# Patient Record
Sex: Female | Born: 1950 | Race: White | Hispanic: No | Smoking: Former smoker
Health system: Southern US, Community
[De-identification: ages and names within clinical notes are randomized; demographics above are authoritative.]

## PROBLEM LIST (undated history)

## (undated) DIAGNOSIS — C801 Malignant (primary) neoplasm, unspecified: Secondary | ICD-10-CM

## (undated) DIAGNOSIS — I10 Essential (primary) hypertension: Secondary | ICD-10-CM

## (undated) DIAGNOSIS — C20 Malignant neoplasm of rectum: Secondary | ICD-10-CM

## (undated) DIAGNOSIS — N1330 Unspecified hydronephrosis: Secondary | ICD-10-CM

## (undated) DIAGNOSIS — Z8041 Family history of malignant neoplasm of ovary: Secondary | ICD-10-CM

## (undated) DIAGNOSIS — Z8619 Personal history of other infectious and parasitic diseases: Secondary | ICD-10-CM

## (undated) DIAGNOSIS — G8929 Other chronic pain: Secondary | ICD-10-CM

## (undated) DIAGNOSIS — N3040 Irradiation cystitis without hematuria: Secondary | ICD-10-CM

## (undated) DIAGNOSIS — M545 Low back pain, unspecified: Secondary | ICD-10-CM

## (undated) DIAGNOSIS — R590 Localized enlarged lymph nodes: Secondary | ICD-10-CM

## (undated) DIAGNOSIS — C73 Malignant neoplasm of thyroid gland: Secondary | ICD-10-CM

## (undated) DIAGNOSIS — M1990 Unspecified osteoarthritis, unspecified site: Secondary | ICD-10-CM

## (undated) DIAGNOSIS — R519 Headache, unspecified: Secondary | ICD-10-CM

## (undated) DIAGNOSIS — Z807 Family history of other malignant neoplasms of lymphoid, hematopoietic and related tissues: Secondary | ICD-10-CM

## (undated) DIAGNOSIS — R51 Headache: Secondary | ICD-10-CM

## (undated) DIAGNOSIS — I890 Lymphedema, not elsewhere classified: Secondary | ICD-10-CM

## (undated) DIAGNOSIS — M5030 Other cervical disc degeneration, unspecified cervical region: Secondary | ICD-10-CM

## (undated) DIAGNOSIS — Z803 Family history of malignant neoplasm of breast: Secondary | ICD-10-CM

## (undated) DIAGNOSIS — R399 Unspecified symptoms and signs involving the genitourinary system: Secondary | ICD-10-CM

## (undated) DIAGNOSIS — D049 Carcinoma in situ of skin, unspecified: Secondary | ICD-10-CM

## (undated) DIAGNOSIS — Z801 Family history of malignant neoplasm of trachea, bronchus and lung: Secondary | ICD-10-CM

## (undated) DIAGNOSIS — E039 Hypothyroidism, unspecified: Secondary | ICD-10-CM

## (undated) DIAGNOSIS — Z973 Presence of spectacles and contact lenses: Secondary | ICD-10-CM

## (undated) HISTORY — DX: Personal history of other infectious and parasitic diseases: Z86.19

## (undated) HISTORY — PX: BUNIONECTOMY: SHX129

## (undated) HISTORY — DX: Family history of malignant neoplasm of breast: Z80.3

## (undated) HISTORY — DX: Headache: R51

## (undated) HISTORY — PX: SKIN CANCER EXCISION: SHX779

## (undated) HISTORY — DX: Family history of malignant neoplasm of trachea, bronchus and lung: Z80.1

## (undated) HISTORY — DX: Carcinoma in situ of skin, unspecified: D04.9

## (undated) HISTORY — DX: Family history of malignant neoplasm of ovary: Z80.41

## (undated) HISTORY — DX: Headache, unspecified: R51.9

## (undated) HISTORY — DX: Family history of other malignant neoplasms of lymphoid, hematopoietic and related tissues: Z80.7

---

## 1988-09-19 HISTORY — PX: SKIN CANCER EXCISION: SHX779

## 2000-06-07 ENCOUNTER — Encounter: Payer: Self-pay | Admitting: Family Medicine

## 2000-06-07 ENCOUNTER — Encounter: Admission: RE | Admit: 2000-06-07 | Discharge: 2000-06-07 | Payer: Self-pay | Admitting: Family Medicine

## 2001-06-11 ENCOUNTER — Encounter: Admission: RE | Admit: 2001-06-11 | Discharge: 2001-06-11 | Payer: Self-pay | Admitting: Family Medicine

## 2001-06-11 ENCOUNTER — Encounter: Payer: Self-pay | Admitting: Family Medicine

## 2002-08-26 ENCOUNTER — Encounter: Admission: RE | Admit: 2002-08-26 | Discharge: 2002-08-26 | Payer: Self-pay | Admitting: Family Medicine

## 2002-08-26 ENCOUNTER — Encounter: Payer: Self-pay | Admitting: Family Medicine

## 2003-10-03 ENCOUNTER — Encounter: Admission: RE | Admit: 2003-10-03 | Discharge: 2003-10-03 | Payer: Self-pay | Admitting: Family Medicine

## 2005-04-07 ENCOUNTER — Ambulatory Visit (HOSPITAL_COMMUNITY): Admission: RE | Admit: 2005-04-07 | Discharge: 2005-04-07 | Payer: Self-pay | Admitting: Family Medicine

## 2005-04-14 ENCOUNTER — Other Ambulatory Visit: Admission: RE | Admit: 2005-04-14 | Discharge: 2005-04-14 | Payer: Self-pay | Admitting: Family Medicine

## 2005-04-18 ENCOUNTER — Encounter: Admission: RE | Admit: 2005-04-18 | Discharge: 2005-04-18 | Payer: Self-pay | Admitting: Family Medicine

## 2006-05-29 ENCOUNTER — Ambulatory Visit (HOSPITAL_COMMUNITY): Admission: RE | Admit: 2006-05-29 | Discharge: 2006-05-29 | Payer: Self-pay | Admitting: Family Medicine

## 2007-07-06 ENCOUNTER — Encounter: Admission: RE | Admit: 2007-07-06 | Discharge: 2007-07-06 | Payer: Self-pay | Admitting: Family Medicine

## 2008-06-02 ENCOUNTER — Ambulatory Visit: Payer: Self-pay | Admitting: Internal Medicine

## 2008-06-02 DIAGNOSIS — M79609 Pain in unspecified limb: Secondary | ICD-10-CM | POA: Insufficient documentation

## 2008-06-02 DIAGNOSIS — M8580 Other specified disorders of bone density and structure, unspecified site: Secondary | ICD-10-CM | POA: Insufficient documentation

## 2008-06-02 DIAGNOSIS — M542 Cervicalgia: Secondary | ICD-10-CM | POA: Insufficient documentation

## 2008-06-02 DIAGNOSIS — M949 Disorder of cartilage, unspecified: Secondary | ICD-10-CM

## 2008-06-02 DIAGNOSIS — M899 Disorder of bone, unspecified: Secondary | ICD-10-CM

## 2008-06-02 LAB — CONVERTED CEMR LAB
Alkaline Phosphatase: 66 units/L (ref 39–117)
Bilirubin, Direct: 0.1 mg/dL (ref 0.0–0.3)
Calcium: 9.3 mg/dL (ref 8.4–10.5)
Eosinophils Absolute: 0.2 10*3/uL (ref 0.0–0.7)
Eosinophils Relative: 4.3 % (ref 0.0–5.0)
GFR calc Af Amer: 132 mL/min
Glucose, Bld: 81 mg/dL (ref 70–99)
HDL: 61.7 mg/dL (ref >39.0)
Hemoglobin: 13.6 g/dL (ref 12.0–15.0)
Lymphocytes Relative: 40.9 % (ref 12.0–46.0)
MCHC: 34.5 g/dL (ref 30.0–36.0)
Monocytes Relative: 6.4 % (ref 3.0–12.0)
Neutro Abs: 2.5 10*3/uL (ref 1.4–7.7)
Neutrophils Relative %: 47.2 % (ref 43.0–77.0)
Platelets: 181 10*3/uL (ref 150–400)
RDW: 12.6 % (ref 11.5–14.6)
Total Bilirubin: 0.8 mg/dL (ref 0.3–1.2)
Total Protein: 7.2 g/dL (ref 6.0–8.3)
Triglycerides: 46 mg/dL (ref 0–149)
VLDL: 9 mg/dL (ref 0–40)

## 2008-06-03 ENCOUNTER — Encounter: Payer: Self-pay | Admitting: Internal Medicine

## 2008-06-03 LAB — CONVERTED CEMR LAB: Vit D, 1,25-Dihydroxy: 29 — ABNORMAL LOW (ref 30–89)

## 2008-06-25 ENCOUNTER — Encounter: Payer: Self-pay | Admitting: Internal Medicine

## 2008-06-25 ENCOUNTER — Other Ambulatory Visit: Admission: RE | Admit: 2008-06-25 | Discharge: 2008-06-25 | Payer: Self-pay | Admitting: Internal Medicine

## 2008-06-25 ENCOUNTER — Ambulatory Visit: Payer: Self-pay | Admitting: Internal Medicine

## 2008-06-25 DIAGNOSIS — D049 Carcinoma in situ of skin, unspecified: Secondary | ICD-10-CM | POA: Insufficient documentation

## 2008-06-25 DIAGNOSIS — E559 Vitamin D deficiency, unspecified: Secondary | ICD-10-CM | POA: Insufficient documentation

## 2008-07-09 ENCOUNTER — Encounter: Admission: RE | Admit: 2008-07-09 | Discharge: 2008-07-09 | Payer: Self-pay | Admitting: Internal Medicine

## 2008-11-11 ENCOUNTER — Ambulatory Visit: Payer: Self-pay | Admitting: Internal Medicine

## 2009-04-03 ENCOUNTER — Ambulatory Visit: Payer: Self-pay | Admitting: Internal Medicine

## 2009-07-10 ENCOUNTER — Encounter: Admission: RE | Admit: 2009-07-10 | Discharge: 2009-07-10 | Payer: Self-pay | Admitting: Internal Medicine

## 2010-07-16 ENCOUNTER — Encounter: Admission: RE | Admit: 2010-07-16 | Discharge: 2010-07-16 | Payer: Self-pay | Admitting: Internal Medicine

## 2010-08-10 ENCOUNTER — Ambulatory Visit: Payer: Self-pay | Admitting: Internal Medicine

## 2010-08-10 LAB — CONVERTED CEMR LAB
AST: 32 units/L (ref 0–37)
Albumin: 3.9 g/dL (ref 3.5–5.2)
BUN: 16 mg/dL (ref 6–23)
Basophils Absolute: 0 10*3/uL (ref 0.0–0.1)
Bilirubin, Direct: 0.1 mg/dL (ref 0.0–0.3)
Calcium: 9.2 mg/dL (ref 8.4–10.5)
Chloride: 104 meq/L (ref 96–112)
Creatinine, Ser: 0.8 mg/dL (ref 0.4–1.2)
Eosinophils Relative: 4 % (ref 0.0–5.0)
HCT: 39 % (ref 36.0–46.0)
HDL: 78.3 mg/dL (ref >39.00)
Hemoglobin: 13.3 g/dL (ref 12.0–15.0)
LDL Cholesterol: 103 mg/dL — ABNORMAL HIGH (ref 0–99)
MCHC: 34.2 g/dL (ref 30.0–36.0)
Monocytes Absolute: 0.4 10*3/uL (ref 0.1–1.0)
Monocytes Relative: 6.3 % (ref 3.0–12.0)
Neutro Abs: 2.8 10*3/uL (ref 1.4–7.7)
Neutrophils Relative %: 48.3 % (ref 43.0–77.0)
Platelets: 205 10*3/uL (ref 150.0–400.0)
RDW: 13.4 % (ref 11.5–14.6)
Sodium: 141 meq/L (ref 135–145)
Total Protein: 6.7 g/dL (ref 6.0–8.3)

## 2010-08-17 ENCOUNTER — Encounter (INDEPENDENT_AMBULATORY_CARE_PROVIDER_SITE_OTHER): Payer: Self-pay | Admitting: *Deleted

## 2010-09-02 ENCOUNTER — Encounter (INDEPENDENT_AMBULATORY_CARE_PROVIDER_SITE_OTHER): Payer: Self-pay

## 2010-09-06 ENCOUNTER — Ambulatory Visit: Payer: Self-pay | Admitting: Gastroenterology

## 2010-09-24 ENCOUNTER — Ambulatory Visit: Admit: 2010-09-24 | Payer: Self-pay | Admitting: Gastroenterology
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
## 2010-10-19 NOTE — Letter (Signed)
Summary: Pre Visit Letter Revised  [facility]  [address] [place], [place] [address]   Phone: [phone]  Fax: [phone]        08/17/2010 MRN: [id number] [name] [address] [place], [place]  [address]             Procedure Date:  09/24/2010  Welcome to the Gastroenterology Division at [facility].    You are scheduled to see a nurse for your pre-procedure visit on 09/06/2010 at 2:00PM on the 3rd floor at [facility], [address]  We ask that you try to arrive at our office 15 minutes prior to your appointment time to allow for check-in.  Please take a minute to review the attached form.  If you answer "Yes" to one or more of the questions on the first page, we ask that you call the person listed at your earliest opportunity.  If you answer "No" to all of the questions, please complete the rest of the form and bring it to your appointment.    Your nurse visit will consist of discussing your medical and surgical history, your immediate family medical history, and your medications.   If you are unable to list all of your medications on the form, please bring the medication bottles to your appointment and we will list them.  We will need to be aware of both prescribed and over the counter drugs.  We will need to know exact dosage information as well.    Please be prepared to read and sign documents such as consent forms, a financial agreement, and acknowledgement forms.  If necessary, and with your consent, a friend or relative is welcome to sit-in on the nurse visit with you.  Please bring your insurance card so that we may make a copy of it.  If your insurance requires a referral to see a specialist, please bring your referral form from your primary care physician.  No co-pay is required for this nurse visit.     If you cannot keep your appointment, please call [phone] to cancel or reschedule prior to your appointment date.   This allows [place] the opportunity to schedule an appointment for another patient in need of care.    Thank you for choosing [facility] for your medical needs.  We appreciate the opportunity to care for you.  Please visit [place] at our website  to learn more about our practice.  Sincerely, The Gastroenterology Division

## 2010-10-21 NOTE — Miscellaneous (Signed)
Summary: Lec previsit  Clinical Lists Changes  Medications: Added new medication of MOVIPREP 100 GM  SOLR (PEG-KCL-NACL-NASULF-NA ASC-C) As per prep instructions. - Signed Rx of MOVIPREP 100 GM  SOLR (PEG-KCL-NACL-NASULF-NA ASC-C) As per prep instructions.;  #1 x 0;  Signed;  Entered by: Ulis Rias RN;  Authorized by: Louis Meckel MD;  Method used: Electronically to [id number] [address] [place], [place], [place]  [address], Ph: [phone], Fax: [phone] Observations: Added new observation of ALLERGY REV: Done (09/07/2010 16:13)    Prescriptions: MOVIPREP 100 GM  SOLR (PEG-KCL-NACL-NASULF-NA ASC-C) As per prep instructions.  #1 x 0   Entered by:   Ulis Rias RN   Authorized by:   Louis Meckel MD   Signed by:   Ulis Rias RN on 09/07/2010   Method used:   [facility] to        [facility] [id number]       [address]       [place], [place]  [address]       Ph: [phone]       Fax: [phone]   RxID:   [id number]

## 2010-10-21 NOTE — Letter (Signed)
Summary: [facility] Instructions  [facility]  [address] [place], [place] [address]   Phone: [phone]  Fax: [phone]       Ashley Moore    02/25/1951    MRN: [id number]        Procedure Day /Date:  09/24/10   Friday     Arrival Time:  10:30am      Procedure Time:  11:30am     Location of Procedure:                    _x _  [facility] (4th Floor)                        PREPARATION FOR COLONOSCOPY WITH MOVIPREP   Starting 5 days prior to your procedure _1/1/12 _ do not eat nuts, seeds, popcorn, corn, beans, peas,  salads, or any raw vegetables.  Do not take any fiber supplements (e.g. Metamucil, Citrucel, and Benefiber).  THE DAY BEFORE YOUR PROCEDURE         DATE:  09/23/10   DAY:  Thursday  1.  Drink clear liquids the entire day-NO SOLID FOOD  2.  Do not drink anything colored red or purple.  Avoid juices with pulp.  No orange juice.  3.  Drink at least 64 oz. (8 glasses) of fluid/clear liquids during the day to prevent dehydration and help the prep work efficiently.  CLEAR LIQUIDS INCLUDE: Water Jello Ice Popsicles Tea (sugar ok, no milk/cream) Powdered fruit flavored drinks Coffee (sugar ok, no milk/cream) Gatorade Juice: apple, white grape, white cranberry  Lemonade Clear bullion, consomm, broth Carbonated beverages (any kind) Strained chicken noodle soup Hard Candy                             4.  In the morning, mix first dose of MoviPrep solution:    Empty 1 Pouch A and 1 Pouch B into the disposable container    Add lukewarm drinking water to the top line of the container. Mix to dissolve    Refrigerate (mixed solution should be used within 24 hrs)  5.  Begin drinking the prep at 5:00 p.m. The MoviPrep container is divided by 4 marks.   Every 15 minutes drink the solution down to the next mark (approximately 8 oz) until the full liter is complete.   6.  Follow completed prep with 16 oz of clear liquid of your  choice (Nothing red or purple).  Continue to drink clear liquids until bedtime.  7.  Before going to bed, mix second dose of MoviPrep solution:    Empty 1 Pouch A and 1 Pouch B into the disposable container    Add lukewarm drinking water to the top line of the container. Mix to dissolve    Refrigerate  THE DAY OF YOUR PROCEDURE      DATE:  09/24/10  DAY:  Friday  Beginning at  6:30 a.m. (5 hours before procedure):         1. Every 15 minutes, drink the solution down to the next mark (approx 8 oz) until the full liter is complete.  2. Follow completed prep with 16 oz. of clear liquid of your choice.    3. You may drink clear liquids until  9:30am  (2 HOURS BEFORE PROCEDURE).   MEDICATION INSTRUCTIONS  Unless otherwise instructed, you should take regular prescription medications with a  small sip of water   as early as possible the morning of your procedure.        OTHER INSTRUCTIONS  You will need a responsible adult at least 62 years of age to accompany you and drive you home.   This person must remain in the waiting room during your procedure.  Wear loose fitting clothing that is easily removed.  Leave jewelry and other valuables at home.  However, you may wish to bring a book to read or  an iPod/MP3 player to listen to music as you wait for your procedure to start.  Remove all body piercing jewelry and leave at home.  Total time from sign-in until discharge is approximately 2-3 hours.  You should go home directly after your procedure and rest.  You can resume normal activities the  day after your procedure.  The day of your procedure you should not:   Drive   Make legal decisions   Operate machinery   Drink alcohol   Return to work  You will receive specific instructions about eating, activities and medications before you leave.    The above instructions have been reviewed and explained to me by   Ulis Rias RN  September 07, 2010 4:34 PM     I  fully understand and can verbalize these instructions _____________________________ Date _________

## 2010-11-05 ENCOUNTER — Encounter: Payer: Self-pay | Admitting: Gastroenterology

## 2010-11-05 ENCOUNTER — Other Ambulatory Visit: Payer: Self-pay | Admitting: Gastroenterology

## 2010-11-05 ENCOUNTER — Other Ambulatory Visit (AMBULATORY_SURGERY_CENTER): Payer: PRIVATE HEALTH INSURANCE | Admitting: Gastroenterology

## 2010-11-05 DIAGNOSIS — D126 Benign neoplasm of colon, unspecified: Secondary | ICD-10-CM

## 2010-11-05 DIAGNOSIS — K5730 Diverticulosis of large intestine without perforation or abscess without bleeding: Secondary | ICD-10-CM

## 2010-11-05 DIAGNOSIS — Z1211 Encounter for screening for malignant neoplasm of colon: Secondary | ICD-10-CM

## 2010-11-10 NOTE — Procedures (Addendum)
Summary: Colonoscopy  Patient: Ashley Moore Note: All result statuses are Final unless otherwise noted.  Tests: (1) Colonoscopy (COL)   COL Colonoscopy           DONE     [facility]     [address]     [place], [place]  [address]           COLONOSCOPY PROCEDURE REPORT           PATIENT:  Ashley Moore, Ashley Moore  MR#:  [id number]     BIRTHDATE:  1951/05/24, 59 yrs. old  GENDER:  female           ENDOSCOPIST:  Barbette Hair. Arlyce Dice, MD     Referred by:  Neta Mends. Panosh, M.D.           PROCEDURE DATE:  11/05/2010     PROCEDURE:  Colonoscopy with snare polypectomy     ASA CLASS:  Class I     INDICATIONS:  1) Routine Risk Screening           MEDICATIONS:   Fentanyl 50 mcg IV, Versed 6 mg IV           DESCRIPTION OF PROCEDURE:   After the risks benefits and     alternatives of the procedure were thoroughly explained, informed     consent was obtained.  Digital rectal exam was performed and     revealed no abnormalities.   The LB [id number] endoscope     was introduced through the anus and advanced to the cecum, which     was identified by both the appendix and ileocecal valve, without     limitations.  The quality of the prep was excellent, using     MoviPrep.  The instrument was then slowly withdrawn as the colon     was fully examined.     <<PROCEDUREIMAGES>>           FINDINGS:  A sessile polyp was found in the sigmoid colon. It was     3 mm in size. It was found 15 cm from the point of entry. Polyp     was snared without cautery. Retrieval was successful (see     image14). snare polyp  Mild diverticulosis was found in the     sigmoid colon (see image13 and image12).  Melanosis coli was found     (see image4 and image5). Severe melanosis throughout the colon     This was otherwise a normal examination of the colon (see image1,     image2, image8, image15, and image16).   Retroflexed views in the     rectum revealed no abnormalities.    The time to cecum =   2.0     minutes. The scope was then withdrawn (time =  7.75  min) from the     patient and the procedure completed.           COMPLICATIONS:  None           ENDOSCOPIC IMPRESSION:     1) 3 mm sessile polyp in the sigmoid colon     2) Mild diverticulosis in the sigmoid colon     3) Melanosis     4) Otherwise normal examination     RECOMMENDATIONS:     1) If the polyp(s) removed today are proven to be adenomatous     (pre-cancerous) polyps, you will need a repeat colonoscopy in 5     years. Otherwise you  should continue to follow colorectal cancer     screening guidelines for "routine risk" patients with colonoscopy     in 10 years.           REPEAT EXAM:   You will receive a letter from Dr. Arlyce Dice in 1-2     weeks, after reviewing the final pathology, with followup     recommendations.           ______________________________     Barbette Hair Arlyce Dice, MD           CC:           n.     eSIGNED:   Barbette Hair. Roxene Alviar at 11/05/2010 02:45 PM           Delrae Alfred, [phone]  Note: An exclamation mark (!) indicates a result that was not dispersed into the flowsheet. Document Creation Date: 11/05/2010 2:46 PM _______________________________________________________________________  (1) Order result status: Final Collection or observation date-time: 11/05/2010 14:37 Requested date-time:  Receipt date-time:  Reported date-time:  Referring Physician:   Ordering Physician: Melvia Heaps [phone]) Specimen Source:  Source: Launa Grill Order Number: [id number] Lab site:   Appended Document: Colonoscopy     Procedures Next Due Date:    Colonoscopy: 11/2020

## 2010-11-11 ENCOUNTER — Encounter: Payer: Self-pay | Admitting: Gastroenterology

## 2010-11-16 NOTE — Letter (Addendum)
Summary: Patient Notice-Hyperplastic Polyps  [facility]  [address] [place], [place] [address]   Phone: [phone]  Fax: [phone]        November 11, 2010 MRN: [id number]    Ashley Moore [address] [place], [place]  [address]    Dear [name],  I am pleased to inform you that the colon polyp(s) removed during your recent colonoscopy was (were) found to be hyperplastic.  These types of polyps are NOT pre-cancerous.  It is therefore my recommendation that you have a repeat colonoscopy examination in 10_ years for routine colorectal cancer screening.  Should you develop new or worsening symptoms of abdominal pain, bowel habit changes or bleeding from the rectum or bowels, please schedule an evaluation with either your primary care physician or with me.  Additional information/recommendations:  __No further action with gastroenterology is needed at this time.      Please follow-up with your primary care physician for your other      healthcare needs. __Please call [phone] to schedule a return visit to review      your situation.  __Please keep your follow-up visit as already scheduled.  _x_Continue treatment plan as outlined the day of your exam.  Please call us if you are having persistent problems or have questions about your condition that have not been fully answered at this time.  Sincerely,  Louis Meckel MD This letter has been electronically signed by your physician.  Appended Document: Patient Notice-Hyperplastic Polyps letter mailed

## 2011-08-02 ENCOUNTER — Other Ambulatory Visit: Payer: Self-pay | Admitting: Internal Medicine

## 2011-08-02 DIAGNOSIS — Z1231 Encounter for screening mammogram for malignant neoplasm of breast: Secondary | ICD-10-CM

## 2011-08-30 ENCOUNTER — Ambulatory Visit
Admission: RE | Admit: 2011-08-30 | Discharge: 2011-08-30 | Disposition: A | Discharge status: Home / Self Care | Payer: PRIVATE HEALTH INSURANCE | Source: Ambulatory Visit | Attending: Internal Medicine | Admitting: Internal Medicine

## 2011-08-30 DIAGNOSIS — Z1231 Encounter for screening mammogram for malignant neoplasm of breast: Secondary | ICD-10-CM

## 2012-07-16 LAB — HM MAMMOGRAPHY: HM Mammogram: NORMAL

## 2012-07-26 ENCOUNTER — Other Ambulatory Visit: Payer: Self-pay | Admitting: Internal Medicine

## 2012-07-26 DIAGNOSIS — Z803 Family history of malignant neoplasm of breast: Secondary | ICD-10-CM

## 2012-07-26 DIAGNOSIS — Z1231 Encounter for screening mammogram for malignant neoplasm of breast: Secondary | ICD-10-CM

## 2012-08-30 ENCOUNTER — Ambulatory Visit
Admission: RE | Admit: 2012-08-30 | Discharge: 2012-08-30 | Disposition: A | Discharge status: Home / Self Care | Payer: PRIVATE HEALTH INSURANCE | Source: Ambulatory Visit | Attending: Internal Medicine | Admitting: Internal Medicine

## 2012-08-30 DIAGNOSIS — Z1231 Encounter for screening mammogram for malignant neoplasm of breast: Secondary | ICD-10-CM

## 2012-08-30 DIAGNOSIS — Z803 Family history of malignant neoplasm of breast: Secondary | ICD-10-CM

## 2013-01-21 ENCOUNTER — Emergency Department (HOSPITAL_COMMUNITY): Payer: PRIVATE HEALTH INSURANCE

## 2013-01-21 ENCOUNTER — Emergency Department (HOSPITAL_COMMUNITY)
Admission: EM | Admit: 2013-01-21 | Discharge: 2013-01-21 | Disposition: A | Discharge status: Home / Self Care | Payer: PRIVATE HEALTH INSURANCE | Attending: Emergency Medicine | Admitting: Emergency Medicine

## 2013-01-21 ENCOUNTER — Encounter (HOSPITAL_COMMUNITY): Payer: Self-pay | Admitting: *Deleted

## 2013-01-21 DIAGNOSIS — R0602 Shortness of breath: Secondary | ICD-10-CM | POA: Insufficient documentation

## 2013-01-21 DIAGNOSIS — R0789 Other chest pain: Secondary | ICD-10-CM | POA: Insufficient documentation

## 2013-01-21 LAB — BASIC METABOLIC PANEL
Calcium: 9.9 mg/dL (ref 8.4–10.5)
Chloride: 103 mEq/L (ref 96–112)
Glucose, Bld: 93 mg/dL (ref 70–99)
Sodium: 140 mEq/L (ref 135–145)

## 2013-01-21 LAB — POCT I-STAT TROPONIN I

## 2013-01-21 LAB — CBC
HCT: 42.9 % (ref 36.0–46.0)
MCV: 81.9 fL (ref 78.0–100.0)
Platelets: 224 10*3/uL (ref 150–400)
RBC: 5.24 MIL/uL — ABNORMAL HIGH (ref 3.87–5.11)

## 2013-01-21 MED ORDER — ASPIRIN 325 MG PO TABS
325.0000 mg | ORAL_TABLET | ORAL | Status: AC
  Administered 2013-01-21: 325 mg via ORAL
  Filled 2013-01-21: qty 1

## 2013-01-21 NOTE — ED Notes (Signed)
Patient states chest pain this am starting in central chest and neck and radiating into right arm

## 2013-01-21 NOTE — ED Provider Notes (Signed)
History     CSN: [id number]  Arrival date & time 01/21/13  0920   First MD Initiated Contact with Patient 01/21/13 [phone]      Chief Complaint  Patient presents with  . Chest Pain  . Shortness of Breath    (Consider location/radiation/quality/duration/timing/severity/associated sxs/prior treatment) HPI  Patient reports this morning about 4:50 in the morning she thought she heard someone walking on her porch and knocking her around her house. She reports she has a neighbor who she feels has been snooping around her house. 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 She reports he was there today about 6:15. When he left at 8:15 she started getting a pain in her right neck it went down into her chest and was sharp for 1-2 minutes. She did feel like it was a "clutching" pain. She had some shortness of breath but denies diaphoresis, nausea, vomiting. She states she's never had this before. Patient reports she has been under a lot of stress. She reports this neighbor has been bothering her for the past 8 years since she has moved to this residence. [place] house [place] acres however this neighbor is her immediate next door neighbor. She reports he also frequents her place of business that she has that is about 1 mile from her house. She reports he has been getting verbally aggressive at times and getting in her face talking to her and suggesting that he would be interested in her if she had "more pep in her step". She also reports her neighbor on the other side is a Emergency planning/management officer. She states she has never talked to him about her problems with the current neighbor.  PCP Dr Fabian Sharp  History reviewed. No pertinent past medical history.  History reviewed. No pertinent past surgical history.  No family history on file. No family history of coronary artery disease  History  Substance Use Topics  . Smoking status: Never Smoker   .  Smokeless tobacco: Not on file  . 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    Allergies  Penicillins  Home Medications  No current outpatient prescriptions on file. Vitamins  BP 142/77  Pulse 67  Temp(Src) 97.6 F (36.4 C) (Oral)  Resp 19  Ht 5\' 3"  (1.6 m)  Wt 190 lb (86.183 kg)  BMI 33.67 kg/m2  SpO2 100%  Vital signs normal    Physical Exam  Nursing note and vitals reviewed. Constitutional: She is oriented to person, place, and time. She appears well-developed and well-nourished.  Non-toxic appearance. She does not appear ill. No distress.  HENT:  Head: Normocephalic and atraumatic.  Right Ear: External ear normal.  Left Ear: External ear normal.  Nose: Nose normal. No mucosal edema or rhinorrhea.  Mouth/Throat: Oropharynx is clear and moist and mucous membranes are normal. No dental abscesses or edematous.  Eyes: Conjunctivae and EOM are normal. Pupils are equal, round, and reactive to light.  Neck: Normal range of motion and full passive range of motion without pain. Neck supple.  Cardiovascular: Normal rate,  regular rhythm and normal heart sounds.  Exam reveals no gallop and no friction rub.   No murmur heard. Pulmonary/Chest: Effort normal and breath sounds normal. No respiratory distress. She has no wheezes. She has no rhonchi. She has no rales. She exhibits no tenderness and no crepitus.  Abdominal: Soft. Normal appearance and bowel sounds are normal. She exhibits no distension. There is no tenderness. There is no rebound and no guarding.  Musculoskeletal: Normal range of motion. She exhibits no edema and no tenderness.  Moves all extremities well.   Neurological: She is alert and oriented to person, place, and time. She  has normal strength. No cranial nerve deficit.  Skin: Skin is warm, dry and intact. No rash noted. No erythema. No pallor.  Psychiatric: Her speech is normal and behavior is normal. Her mood appears not anxious.  anxious    ED Course  Procedures (including critical care time)  Pt remains pain free during her ED visit.  She spoke to the off duty police officer in the ED who advised her how to handle her neighbor. We also discussed letting her other next door neighbor, who is a Emergency planning/management officer know about her problems and he may also be able to give her assistance, and to make sure she is safe at home and at work.   Results for orders placed during the hospital encounter of 01/21/13  CBC      Result Value Range   WBC 7.1  4.0 - 10.5 K/uL   RBC 5.24 (*) 3.87 - 5.11 MIL/uL   Hemoglobin 14.6  12.0 - 15.0 g/dL   HCT 16.1  09.6 - 04.5 %   MCV 81.9  78.0 - 100.0 fL   MCH 27.9  26.0 - 34.0 pg   MCHC 34.0  30.0 - 36.0 g/dL   RDW 40.9  81.1 - 91.4 %   Platelets 224  150 - 400 K/uL  BASIC METABOLIC PANEL      Result Value Range   Sodium 140  135 - 145 mEq/L   Potassium 3.9  3.5 - 5.1 mEq/L   Chloride 103  96 - 112 mEq/L   CO2 27  19 - 32 mEq/L   Glucose, Bld 93  70 - 99 mg/dL   BUN 15  6 - 23 mg/dL   Creatinine, Ser 7.82  0.50 - 1.10 mg/dL   Calcium 9.9  8.4 - 95.6 mg/dL   GFR calc non Af Amer >90  >90 mL/min   GFR calc Af Amer >90  >90 mL/min  POCT I-STAT TROPONIN I      Result Value Range   Troponin i, poc 0.00  0.00 - 0.08 ng/mL   Comment 3             Laboratory interpretation all normal  Dg Chest Portable 1 View  01/21/2013  *RADIOLOGY REPORT*  Clinical Data: Sharp pain in right jaw extending into right chest and down right arm, nausea, dizziness, slight headache, former smoker  PORTABLE CHEST - 1 VIEW  Comparison: Portable exam 1025 hours without priors for comparison  Findings: Normal heart size and pulmonary vascularity. Tortuous aorta. Minimal right basilar atelectasis. Lungs  otherwise clear. No pleural effusion or pneumothorax. Bones unremarkable.  IMPRESSION: Minimal right basilar atelectasis.   Original Report Authenticated By: Ulyses Southward, M.D.      Date: 01/21/2013  Rate: 68  Rhythm: normal sinus rhythm  QRS Axis: normal  Intervals: normal  ST/T Wave abnormalities: normal  Conduction Disutrbances:none  Narrative Interpretation:   Old EKG Reviewed: none available      1. Chest pain, atypical     Plan discharge  Devoria Albe, MD, FACEP       MDM patient has no risk factors for coronary artery disease. She does report a lot of stress related to her current neighbor. She spoke to the off duty police officer in the emergency department and we also discussed ways to proceed with his behavior and to keep herself safe.           Ward Givens, MD 01/21/13 1251

## 2013-02-06 ENCOUNTER — Other Ambulatory Visit (INDEPENDENT_AMBULATORY_CARE_PROVIDER_SITE_OTHER): Payer: PRIVATE HEALTH INSURANCE

## 2013-02-06 DIAGNOSIS — Z0000 Encounter for general adult medical examination without abnormal findings: Secondary | ICD-10-CM

## 2013-02-06 LAB — TSH: TSH: 2.74 u[IU]/mL (ref 0.35–5.50)

## 2013-02-06 LAB — CBC WITH DIFFERENTIAL/PLATELET
Basophils Absolute: 0 10*3/uL (ref 0.0–0.1)
Eosinophils Absolute: 0.3 10*3/uL (ref 0.0–0.7)
Eosinophils Relative: 4.5 % (ref 0.0–5.0)
HCT: 40.6 % (ref 36.0–46.0)
Lymphocytes Relative: 36 % (ref 12.0–46.0)
Lymphs Abs: 2.6 10*3/uL (ref 0.7–4.0)
MCHC: 33.1 g/dL (ref 30.0–36.0)
Monocytes Relative: 5.8 % (ref 3.0–12.0)
Neutrophils Relative %: 53.2 % (ref 43.0–77.0)
Platelets: 187 10*3/uL (ref 150.0–400.0)
WBC: 7.1 10*3/uL (ref 4.5–10.5)

## 2013-02-06 LAB — HEPATIC FUNCTION PANEL
AST: 30 U/L (ref 0–37)
Albumin: 3.8 g/dL (ref 3.5–5.2)
Bilirubin, Direct: 0.1 mg/dL (ref 0.0–0.3)
Total Bilirubin: 0.4 mg/dL (ref 0.3–1.2)

## 2013-02-06 LAB — BASIC METABOLIC PANEL
BUN: 14 mg/dL (ref 6–23)
Calcium: 9 mg/dL (ref 8.4–10.5)
Potassium: 3.9 mEq/L (ref 3.5–5.1)
Sodium: 139 mEq/L (ref 135–145)

## 2013-02-06 LAB — LIPID PANEL
HDL: 71.2 mg/dL (ref >39.00)
Total CHOL/HDL Ratio: 2

## 2013-02-13 ENCOUNTER — Ambulatory Visit (INDEPENDENT_AMBULATORY_CARE_PROVIDER_SITE_OTHER): Payer: PRIVATE HEALTH INSURANCE | Admitting: Internal Medicine

## 2013-02-13 ENCOUNTER — Other Ambulatory Visit (HOSPITAL_COMMUNITY)
Admission: RE | Admit: 2013-02-13 | Discharge: 2013-02-13 | Disposition: A | Discharge status: Home / Self Care | Payer: PRIVATE HEALTH INSURANCE | Source: Ambulatory Visit | Attending: Internal Medicine | Admitting: Internal Medicine

## 2013-02-13 ENCOUNTER — Encounter: Payer: Self-pay | Admitting: Internal Medicine

## 2013-02-13 VITALS — BP 120/60 | HR 78 | Temp 97.7°F | Ht 62.5 in | Wt 205.0 lb

## 2013-02-13 DIAGNOSIS — Z1151 Encounter for screening for human papillomavirus (HPV): Secondary | ICD-10-CM | POA: Insufficient documentation

## 2013-02-13 DIAGNOSIS — Z23 Encounter for immunization: Secondary | ICD-10-CM

## 2013-02-13 DIAGNOSIS — Z01419 Encounter for gynecological examination (general) (routine) without abnormal findings: Secondary | ICD-10-CM | POA: Insufficient documentation

## 2013-02-13 DIAGNOSIS — Z0000 Encounter for general adult medical examination without abnormal findings: Secondary | ICD-10-CM

## 2013-02-13 DIAGNOSIS — Z803 Family history of malignant neoplasm of breast: Secondary | ICD-10-CM

## 2013-02-13 DIAGNOSIS — Z2911 Encounter for prophylactic immunotherapy for respiratory syncytial virus (RSV): Secondary | ICD-10-CM

## 2013-02-13 DIAGNOSIS — R21 Rash and other nonspecific skin eruption: Secondary | ICD-10-CM | POA: Insufficient documentation

## 2013-02-13 MED ORDER — FLUOCINONIDE-E 0.05 % EX CREA: TOPICAL_CREAM | Freq: Two times a day (BID) | CUTANEOUS | Status: DC

## 2013-02-13 NOTE — Patient Instructions (Addendum)
Rash   Not shingles  . Could be contact dermatitis  Consider  Fungal or tinea   On skin  And treat for both. Use OTC lamasil or lotrimin twice a day for at least 2-3 weeks   If improving then continue until resolved  And add the   Steroid topical also .  If  persistent or progressive  Contact us for advice or recheck .  Will notify you  of PAPs   when available.  Add aerobic.    To  Mix of exercise .     Ask  Your sister about ?  If tested for breast cancer gene for your risk  .  Get regular mammograms .   Preventive Care for Adults, Female A healthy lifestyle and preventive care can promote health and wellness. Preventive health guidelines for women include the following key practices.  A routine yearly physical is a good way to check with your caregiver about your health and preventive screening. It is a chance to share any concerns and updates on your health, and to receive a thorough exam.  Visit your dentist for a routine exam and preventive care every 6 months. Brush your teeth twice a day and floss once a day. Good oral hygiene prevents tooth decay and gum disease.  The frequency of eye exams is based on your age, health, family medical history, use of contact lenses, and other factors. Follow your caregiver's recommendations for frequency of eye exams.  Eat a healthy diet. Foods like vegetables, fruits, whole grains, low-fat dairy products, and lean protein foods contain the nutrients you need without too many calories. Decrease your intake of foods high in solid fats, added sugars, and salt. Eat the right amount of calories for you.Get information about a proper diet from your caregiver, if necessary.  Regular physical exercise is one of the most important things you can do for your health. Most adults should get at least 150 minutes of moderate-intensity exercise (any activity that increases your heart rate and causes you to sweat) each week. In addition, most adults need  muscle-strengthening exercises on 2 or more days a week.  Maintain a healthy weight. The body mass index (BMI) is a screening tool to identify possible weight problems. It provides an estimate of body fat based on height and weight. Your caregiver can help determine your BMI, and can help you achieve or maintain a healthy weight.For adults 20 years and older:  A BMI below 18.5 is considered underweight.  A BMI of 18.5 to 24.9 is normal.  A BMI of 25 to 29.9 is considered overweight.  A BMI of 30 and above is considered obese.  Maintain normal blood lipids and cholesterol levels by exercising and minimizing your intake of saturated fat. Eat a balanced diet with plenty of fruit and vegetables. Blood tests for lipids and cholesterol should begin at age 38 and be repeated every 5 years. If your lipid or cholesterol levels are high, you are over 50, or you are at high risk for heart disease, you may need your cholesterol levels checked more frequently.Ongoing high lipid and cholesterol levels should be treated with medicines if diet and exercise are not effective.  If you smoke, find out from your caregiver how to quit. If you do not use tobacco, do not start.  If you are pregnant, do not drink alcohol. If you are breastfeeding, be very cautious about drinking alcohol. If you are not pregnant and choose to drink alcohol, do  not exceed 1 drink per day. One drink is considered to be 12 ounces (355 mL) of beer, 5 ounces (148 mL) of wine, or 1.5 ounces (44 mL) of liquor.  Avoid use of street drugs. Do not share needles with anyone. Ask for help if you need support or instructions about stopping the use of drugs.  High blood pressure causes heart disease and increases the risk of stroke. Your blood pressure should be checked at least every 1 to 2 years. Ongoing high blood pressure should be treated with medicines if weight loss and exercise are not effective.  If you are 47 to 64 years old, ask your  caregiver if you should take aspirin to prevent strokes.  Diabetes screening involves taking a blood sample to check your fasting blood sugar level. This should be done once every 3 years, after age 61, if you are within normal weight and without risk factors for diabetes. Testing should be considered at a younger age or be carried out more frequently if you are overweight and have at least 1 risk factor for diabetes.  Breast cancer screening is essential preventive care for women. You should practice "breast self-awareness." This means understanding the normal appearance and feel of your breasts and may include breast self-examination. Any changes detected, no matter how small, should be reported to a caregiver. Women in their 81s and 30s should have a clinical breast exam (CBE) by a caregiver as part of a regular health exam every 1 to 3 years. After age 28, women should have a CBE every year. Starting at age 47, women should consider having a mammography (breast X-ray test) every year. Women who have a family history of breast cancer should talk to their caregiver about genetic screening. Women at a high risk of breast cancer should talk to their caregivers about having magnetic resonance imaging (MRI) and a mammography every year.  The Pap test is a screening test for cervical cancer. A Pap test can show cell changes on the cervix that might become cervical cancer if left untreated. A Pap test is a procedure in which cells are obtained and examined from the lower end of the uterus (cervix).  Women should have a Pap test starting at age 75.  Between ages 64 and 48, Pap tests should be repeated every 2 years.  Beginning at age 9, you should have a Pap test every 3 years as long as the past 3 Pap tests have been normal.  Some women have medical problems that increase the chance of getting cervical cancer. Talk to your caregiver about these problems. It is especially important to talk to your  caregiver if a new problem develops soon after your last Pap test. In these cases, your caregiver may recommend more frequent screening and Pap tests.  The above recommendations are the same for women who have or have not gotten the vaccine for human papillomavirus (HPV).  If you had a hysterectomy for a problem that was not cancer or a condition that could lead to cancer, then you no longer need Pap tests. Even if you no longer need a Pap test, a regular exam is a good idea to make sure no other problems are starting.  If you are between ages 72 and 71, and you have had normal Pap tests going back 10 years, you no longer need Pap tests. Even if you no longer need a Pap test, a regular exam is a good idea to make sure no other  problems are starting.  If you have had past treatment for cervical cancer or a condition that could lead to cancer, you need Pap tests and screening for cancer for at least 20 years after your treatment.  If Pap tests have been discontinued, risk factors (such as a new sexual partner) need to be reassessed to determine if screening should be resumed.  The HPV test is an additional test that may be used for cervical cancer screening. The HPV test looks for the virus that can cause the cell changes on the cervix. The cells collected during the Pap test can be tested for HPV. The HPV test could be used to screen women aged 96 years and older, and should be used in women of any age who have unclear Pap test results. After the age of 25, women should have HPV testing at the same frequency as a Pap test.  Colorectal cancer can be detected and often prevented. Most routine colorectal cancer screening begins at the age of 29 and continues through age 50. However, your caregiver may recommend screening at an earlier age if you have risk factors for colon cancer. On a yearly basis, your caregiver may provide home test kits to check for hidden blood in the stool. Use of a small camera at  the end of a tube, to directly examine the colon (sigmoidoscopy or colonoscopy), can detect the earliest forms of colorectal cancer. Talk to your caregiver about this at age 18, when routine screening begins. Direct examination of the colon should be repeated every 5 to 10 years through age 50, unless early forms of pre-cancerous polyps or small growths are found.  Hepatitis C blood testing is recommended for all people born from [birth year] through 1965 and any individual with known risks for hepatitis C.  Practice safe sex. Use condoms and avoid high-risk sexual practices to reduce the spread of sexually transmitted infections (STIs). STIs include gonorrhea, chlamydia, syphilis, trichomonas, herpes, HPV, and human immunodeficiency virus (HIV). Herpes, HIV, and HPV are viral illnesses that have no cure. They can result in disability, cancer, and death. Sexually active women aged 5 and younger should be checked for chlamydia. Older women with new or multiple partners should also be tested for chlamydia. Testing for other STIs is recommended if you are sexually active and at increased risk.  Osteoporosis is a disease in which the bones lose minerals and strength with aging. This can result in serious bone fractures. The risk of osteoporosis can be identified using a bone density scan. Women ages 40 and over and women at risk for fractures or osteoporosis should discuss screening with their caregivers. Ask your caregiver whether you should take a calcium supplement or vitamin D to reduce the rate of osteoporosis.  Menopause can be associated with physical symptoms and risks. Hormone replacement therapy is available to decrease symptoms and risks. You should talk to your caregiver about whether hormone replacement therapy is right for you.  Use sunscreen with sun protection factor (SPF) of 30 or more. Apply sunscreen liberally and repeatedly throughout the day. You should seek shade when your shadow is shorter  than you. Protect yourself by wearing long sleeves, pants, a wide-brimmed hat, and sunglasses year round, whenever you are outdoors.  Once a month, do a whole body skin exam, using a mirror to look at the skin on your back. Notify your caregiver of new moles, moles that have irregular borders, moles that are larger than a pencil eraser, or moles that  have changed in shape or color.  Stay current with required immunizations.  Influenza. You need a dose every fall (or winter). The composition of the flu vaccine changes each year, so being vaccinated once is not enough.  Pneumococcal polysaccharide. You need 1 to 2 doses if you smoke cigarettes or if you have certain chronic medical conditions. You need 1 dose at age 9 (or older) if you have never been vaccinated.  Tetanus, diphtheria, pertussis (Tdap, Td). Get 1 dose of Tdap vaccine if you are younger than age 62, are over 38 and have contact with an infant, are a Research scientist (physical sciences), are pregnant, or simply want to be protected from whooping cough. After that, you need a Td booster dose every 10 years. Consult your caregiver if you have not had at least 3 tetanus and diphtheria-containing shots sometime in your life or have a deep or dirty wound.  HPV. You need this vaccine if you are a woman age 2 or younger. The vaccine is given in 3 doses over 6 months.  Measles, mumps, rubella (MMR). You need at least 1 dose of MMR if you were born in 1957 or later. You may also need a second dose.  Meningococcal. If you are age 45 to 23 and a first-year college student living in a residence hall, or have one of several medical conditions, you need to get vaccinated against meningococcal disease. You may also need additional booster doses.  Zoster (shingles). If you are age 77 or older, you should get this vaccine.  Varicella (chickenpox). If you have never had chickenpox or you were vaccinated but received only 1 dose, talk to your caregiver to find out if  you need this vaccine.  Hepatitis A. You need this vaccine if you have a specific risk factor for hepatitis A virus infection or you simply wish to be protected from this disease. The vaccine is usually given as 2 doses, 6 to 18 months apart.  Hepatitis B. You need this vaccine if you have a specific risk factor for hepatitis B virus infection or you simply wish to be protected from this disease. The vaccine is given in 3 doses, usually over 6 months. Preventive Services / Frequency Ages 37 to 80  Blood pressure check.** / Every 1 to 2 years.  Lipid and cholesterol check.** / Every 5 years beginning at age 96.  Clinical breast exam.** / Every 3 years for women in their 83s and 30s.  Pap test.** / Every 2 years from ages 63 through 1. Every 3 years starting at age 36 through age 66 or 32 with a history of 3 consecutive normal Pap tests.  HPV screening.** / Every 3 years from ages 33 through ages 110 to 109 with a history of 3 consecutive normal Pap tests.  Hepatitis C blood test.** / For any individual with known risks for hepatitis C.  Skin self-exam. / Monthly.  Influenza immunization.** / Every year.  Pneumococcal polysaccharide immunization.** / 1 to 2 doses if you smoke cigarettes or if you have certain chronic medical conditions.  Tetanus, diphtheria, pertussis (Tdap, Td) immunization. / A one-time dose of Tdap vaccine. After that, you need a Td booster dose every 10 years.  HPV immunization. / 3 doses over 6 months, if you are 31 and younger.  Measles, mumps, rubella (MMR) immunization. / You need at least 1 dose of MMR if you were born in 1957 or later. You may also need a second dose.  Meningococcal immunization. /  1 dose if you are age 28 to 41 and a first-year college student living in a residence hall, or have one of several medical conditions, you need to get vaccinated against meningococcal disease. You may also need additional booster doses.  Varicella immunization.** /  Consult your caregiver.  Hepatitis A immunization.** / Consult your caregiver. 2 doses, 6 to 18 months apart.  Hepatitis B immunization.** / Consult your caregiver. 3 doses usually over 6 months. Ages 65 to 6  Blood pressure check.** / Every 1 to 2 years.  Lipid and cholesterol check.** / Every 5 years beginning at age 13.  Clinical breast exam.** / Every year after age 18.  Mammogram.** / Every year beginning at age 24 and continuing for as long as you are in good health. Consult with your caregiver.  Pap test.** / Every 3 years starting at age 74 through age 15 or 28 with a history of 3 consecutive normal Pap tests.  HPV screening.** / Every 3 years from ages 101 through ages 41 to 56 with a history of 3 consecutive normal Pap tests.  Fecal occult blood test (FOBT) of stool. / Every year beginning at age 35 and continuing until age 62. You may not need to do this test if you get a colonoscopy every 10 years.  Flexible sigmoidoscopy or colonoscopy.** / Every 5 years for a flexible sigmoidoscopy or every 10 years for a colonoscopy beginning at age 59 and continuing until age 91.  Hepatitis C blood test.** / For all people born from [birth year] through 1967 and any individual with known risks for hepatitis C.  Skin self-exam. / Monthly.  Influenza immunization.** / Every year.  Pneumococcal polysaccharide immunization.** / 1 to 2 doses if you smoke cigarettes or if you have certain chronic medical conditions.  Tetanus, diphtheria, pertussis (Tdap, Td) immunization.** / A one-time dose of Tdap vaccine. After that, you need a Td booster dose every 10 years.  Measles, mumps, rubella (MMR) immunization. / You need at least 1 dose of MMR if you were born in 1957 or later. You may also need a second dose.  Varicella immunization.** / Consult your caregiver.  Meningococcal immunization.** / Consult your caregiver.  Hepatitis A immunization.** / Consult your caregiver. 2 doses, 6 to 18 months  apart.  Hepatitis B immunization.** / Consult your caregiver. 3 doses, usually over 6 months. Ages 57 and over  Blood pressure check.** / Every 1 to 2 years.  Lipid and cholesterol check.** / Every 5 years beginning at age 74.  Clinical breast exam.** / Every year after age 31.  Mammogram.** / Every year beginning at age 68 and continuing for as long as you are in good health. Consult with your caregiver.  Pap test.** / Every 3 years starting at age 98 through age 35 or 79 with a 3 consecutive normal Pap tests. Testing can be stopped between 65 and 70 with 3 consecutive normal Pap tests and no abnormal Pap or HPV tests in the past 10 years.  HPV screening.** / Every 3 years from ages 82 through ages 9 or 73 with a history of 3 consecutive normal Pap tests. Testing can be stopped between 65 and 70 with 3 consecutive normal Pap tests and no abnormal Pap or HPV tests in the past 10 years.  Fecal occult blood test (FOBT) of stool. / Every year beginning at age 19 and continuing until age 101. You may not need to do this test if you get a colonoscopy  every 10 years.  Flexible sigmoidoscopy or colonoscopy.** / Every 5 years for a flexible sigmoidoscopy or every 10 years for a colonoscopy beginning at age 47 and continuing until age 78.  Hepatitis C blood test.** / For all people born from [birth year] through 1967 and any individual with known risks for hepatitis C.  Osteoporosis screening.** / A one-time screening for women ages 8 and over and women at risk for fractures or osteoporosis.  Skin self-exam. / Monthly.  Influenza immunization.** / Every year.  Pneumococcal polysaccharide immunization.** / 1 dose at age 47 (or older) if you have never been vaccinated.  Tetanus, diphtheria, pertussis (Tdap, Td) immunization. / A one-time dose of Tdap vaccine if you are over 65 and have contact with an infant, are a Research scientist (physical sciences), or simply want to be protected from whooping cough. After that, you  need a Td booster dose every 10 years.  Varicella immunization.** / Consult your caregiver.  Meningococcal immunization.** / Consult your caregiver.  Hepatitis A immunization.** / Consult your caregiver. 2 doses, 6 to 18 months apart.  Hepatitis B immunization.** / Check with your caregiver. 3 doses, usually over 6 months. ** Family history and personal history of risk and conditions may change your caregiver's recommendations. Document Released: 11/01/2001 Document Revised: 11/28/2011 Document Reviewed: 01/31/2011 [facility] Patient Information 2014 [place], [place].

## 2013-02-13 NOTE — Progress Notes (Signed)
Chief Complaint  Patient presents with  . Annual Exam    HPI: Patient comes in today for Preventive Health Care visit  Last ov was 11 11    Records in [place] [facility]   To review Since that time she has done pretty well although recently ended up in the emergency department with an episode of atypical chest pain that was worrisome to her and scary occurred after a stressful event this was on May 5 had normal labs and EKG. Not felt to be cardiac  She is physically active with heavy lifting does yoga but not a lot of specific aerobic at this time no cardiovascular pulmonary symptoms with this  Left leg rash  Getting smaller   Using topicals   Of everything    No pain   wonders if it could be shingles but it has been there for 12 weeksWas round   At beginning   Itchy  without pain  1 shingles vaccine if this isn't shingles She is up-to-date on her colonoscopy due for DTaP and Pap smear.  ROS:  GEN/ HEENT: No fever, significant weight changes sweats headaches vision problems hearing changes, CV/ PULM; No chest pain shortness of breath cough, syncope,edema  change in exercise tolerance. GI /GU: No adominal pain, vomiting, change in bowel habits. No blood in the stool. No significant GU symptoms. SKIN/HEME: ,no acute skin rashes suspicious lesions or bleeding. No lymphadenopathy, nodules, masses.  NEURO/ PSYCH:  No neurologic signs such as weakness numbness. No depression anxiety. IMM/ Allergy: No unusual infections.  Allergy .   REST of 12 system review negative except as per HPI   Past Medical History  Diagnosis Date  . Bowen's disease     excised 1990  . Hx of varicella     Family History  Problem Relation Age of Onset  . Breast cancer      mom and MGM and sister   . Pancreatitis Father     History   Social History  . Marital Status: Married    Spouse Name: N/A    Number of Children: N/A  . Years of Education: N/A   Social History Main Topics  . Smoking status: Never Smoker    . Smokeless tobacco: None  . Alcohol Use: Yes  . Drug Use: Yes    Special: Marijuana  . Sexually Active: None   Other Topics Concern  . None   Social History Narrative   H H  of 2      5 pets.   She is a former smoker   Retired Medical laboratory scientific officer fine arts   Husband smokes he has active heart disease and vascular disease   etoh   Red wine  1 per night.    Tea green tea and earl gray    Moved from [place] to [facility] area in 1985   1 pregnancy                   Outpatient Encounter Prescriptions as of 02/13/2013  Medication Sig Dispense Refill  . aspirin 81 MG tablet Take 81 mg by mouth daily.      . Calcium-Magnesium-Vitamin D (CALCIUM MAGNESIUM PO) Take by mouth.      . Cholecalciferol (VITAMIN D-3 PO) Take by mouth. 1000 units      . GINSENG KOREAN PO Take by mouth.      . fluocinonide-emollient (LIDEX-E) 0.05 % cream Apply topically 2 (two) times daily. To rash  Not on face .  15 g  1   No facility-administered encounter medications on file as of 02/13/2013.    EXAM:  BP 120/60  Pulse 78  Temp(Src) 97.7 F (36.5 C) (Oral)  Ht 5' 2.5" (1.588 m)  Wt 205 lb (92.987 kg)  BMI 36.87 kg/m2  SpO2 97%  Body mass index is 36.87 kg/(m^2).  Physical Exam: Vital signs reviewed [id number] is a well-developed well-nourished alert cooperative   female who appears her stated age in no acute distress.  HEENT: normocephalic atraumatic , Eyes: PERRL EOM's full, conjunctiva clear, Nares: paten,t no deformity discharge or tenderness., Ears: no deformity EAC's clear TMs with normal landmarks. Mouth: clear OP, no lesions, edema.  Moist mucous membranes. Dentition in adequate repair. NECK: supple without masses, thyromegaly or bruits. CHEST/PULM:  Clear to auscultation and percussion breath sounds equal no wheeze , rales or rhonchi. No chest wall deformities or tenderness. Breast: normal by inspection . No dimpling, discharge, masses, tenderness or discharge . CV: PMI is  nondisplaced, S1 S2 no gallops, murmurs, rubs. Peripheral pulses are full without delay.No JVD .  ABDOMEN: Bowel sounds normal nontender  No guard or rebound, no hepato splenomegal no CVA tenderness.  No hernia. Extremtities:  No clubbing cyanosis or edema, no acute joint swelling or redness no focal atrophy NEURO:  Oriented x3, cranial nerves 3-12 appear to be intact, no obvious focal weakness,gait within normal limits no abnormal reflexes or asymmetrical SKIN:  normal turgor, color, no bruising or petechiae. There is a 2-3 cm rounded patch that is red; her with some scaling with a fairly discrete border but not clearing or irregularity. Some thickening of the skin. This is on her left lower extremity above the ankle PSYCH: Oriented, good eye contact, no obvious depression anxiety, cognition and judgment appear normal. Pelvic: NL ext GU, labia clear without lesions or rash . Vagina no lesions .Cervix: clear  UTERUS: Neg CMT Adnexa:  clear no masses . PAP done rectal exam negative for masses stool smear heme-negative LN: no cervical axillary inguinal adenopathy  Lab Results  Component Value Date   WBC 7.1 02/06/2013   HGB 13.4 02/06/2013   HCT 40.6 02/06/2013   PLT 187.0 02/06/2013   GLUCOSE 76 02/06/2013   CHOL 172 02/06/2013   TRIG 64.0 02/06/2013   HDL 71.20 02/06/2013   LDLCALC 88 02/06/2013   ALT 22 02/06/2013   AST 30 02/06/2013   NA 139 02/06/2013   K 3.9 02/06/2013   CL 106 02/06/2013   CREATININE 0.7 02/06/2013   BUN 14 02/06/2013   CO2 26 02/06/2013   TSH 2.74 02/06/2013    ASSESSMENT AND PLAN:  Discussed the following assessment and plan:  Visit for preventive health examination - Plan: PAP [[place]]  Routine gynecological examination - Normal exam today HPV testing - Plan: PAP [[place]]  Need for Tdap vaccination - Plan: Tdap vaccine greater than or equal to 7yo IM  Need for zoster vaccination - Plan: Varicella-zoster vaccine subcutaneous  Family history of breast cancer  in first degree relative - Mother premenopausal ;sister postmenopausal  other relatives on mother's side.  Rash and nonspecific skin eruption - Treat for contact and possible tinea with Lotrimin and Lidex expectant management followup if persistent progressive for more evaluation Counseled regarding healthy nutrition, exercise, sleep, injury prevention, calcium vit d and healthy weight . Also discussed the 2 first degree relatives and strong family history in her mom's side of breast cancer. Consider asking her sister her sister's oncologist if she  was tested for the breast cancer Carney Bern because of the strong family history ,  information given imprinted out today. Patient Care Team: Madelin Headings, MD as PCP - General Louis Meckel, MD as Attending Physician (Gastroenterology) Patient Instructions  Rash   Not shingles  . Could be contact dermatitis  Consider  Fungal or tinea   On skin  And treat for both. Use OTC lamasil or lotrimin twice a day for at least 2-3 weeks   If improving then continue until resolved  And add the   Steroid topical also .  If  persistent or progressive  Contact us for advice or recheck .  Will notify you  of PAPs   when available.  Add aerobic.    To  Mix of exercise .     Ask  Your sister about ?  If tested for breast cancer gene for your risk  .  Get regular mammograms .   Preventive Care for Adults, Female A healthy lifestyle and preventive care can promote health and wellness. Preventive health guidelines for women include the following key practices.  A routine yearly physical is a good way to check with your caregiver about your health and preventive screening. It is a chance to share any concerns and updates on your health, and to receive a thorough exam.  Visit your dentist for a routine exam and preventive care every 6 months. Brush your teeth twice a day and floss once a day. Good oral hygiene prevents tooth decay and gum disease.  The frequency of  eye exams is based on your age, health, family medical history, use of contact lenses, and other factors. Follow your caregiver's recommendations for frequency of eye exams.  Eat a healthy diet. Foods like vegetables, fruits, whole grains, low-fat dairy products, and lean protein foods contain the nutrients you need without too many calories. Decrease your intake of foods high in solid fats, added sugars, and salt. Eat the right amount of calories for you.Get information about a proper diet from your caregiver, if necessary.  Regular physical exercise is one of the most important things you can do for your health. Most adults should get at least 150 minutes of moderate-intensity exercise (any activity that increases your heart rate and causes you to sweat) each week. In addition, most adults need muscle-strengthening exercises on 2 or more days a week.  Maintain a healthy weight. The body mass index (BMI) is a screening tool to identify possible weight problems. It provides an estimate of body fat based on height and weight. Your caregiver can help determine your BMI, and can help you achieve or maintain a healthy weight.For adults 20 years and older:  A BMI below 18.5 is considered underweight.  A BMI of 18.5 to 24.9 is normal.  A BMI of 25 to 29.9 is considered overweight.  A BMI of 30 and above is considered obese.  Maintain normal blood lipids and cholesterol levels by exercising and minimizing your intake of saturated fat. Eat a balanced diet with plenty of fruit and vegetables. Blood tests for lipids and cholesterol should begin at age 15 and be repeated every 5 years. If your lipid or cholesterol levels are high, you are over 50, or you are at high risk for heart disease, you may need your cholesterol levels checked more frequently.Ongoing high lipid and cholesterol levels should be treated with medicines if diet and exercise are not effective.  If you smoke, find out from your caregiver  how to quit. If you do not use tobacco, do not start.  If you are pregnant, do not drink alcohol. If you are breastfeeding, be very cautious about drinking alcohol. If you are not pregnant and choose to drink alcohol, do not exceed 1 drink per day. One drink is considered to be 12 ounces (355 mL) of beer, 5 ounces (148 mL) of wine, or 1.5 ounces (44 mL) of liquor.  Avoid use of street drugs. Do not share needles with anyone. Ask for help if you need support or instructions about stopping the use of drugs.  High blood pressure causes heart disease and increases the risk of stroke. Your blood pressure should be checked at least every 1 to 2 years. Ongoing high blood pressure should be treated with medicines if weight loss and exercise are not effective.  If you are 82 to 64 years old, ask your caregiver if you should take aspirin to prevent strokes.  Diabetes screening involves taking a blood sample to check your fasting blood sugar level. This should be done once every 3 years, after age 67, if you are within normal weight and without risk factors for diabetes. Testing should be considered at a younger age or be carried out more frequently if you are overweight and have at least 1 risk factor for diabetes.  Breast cancer screening is essential preventive care for women. You should practice "breast self-awareness." This means understanding the normal appearance and feel of your breasts and may include breast self-examination. Any changes detected, no matter how small, should be reported to a caregiver. Women in their 81s and 30s should have a clinical breast exam (CBE) by a caregiver as part of a regular health exam every 1 to 3 years. After age 91, women should have a CBE every year. Starting at age 7, women should consider having a mammography (breast X-ray test) every year. Women who have a family history of breast cancer should talk to their caregiver about genetic screening. Women at a high risk of  breast cancer should talk to their caregivers about having magnetic resonance imaging (MRI) and a mammography every year.  The Pap test is a screening test for cervical cancer. A Pap test can show cell changes on the cervix that might become cervical cancer if left untreated. A Pap test is a procedure in which cells are obtained and examined from the lower end of the uterus (cervix).  Women should have a Pap test starting at age 61.  Between ages 86 and 3, Pap tests should be repeated every 2 years.  Beginning at age 72, you should have a Pap test every 3 years as long as the past 3 Pap tests have been normal.  Some women have medical problems that increase the chance of getting cervical cancer. Talk to your caregiver about these problems. It is especially important to talk to your caregiver if a new problem develops soon after your last Pap test. In these cases, your caregiver may recommend more frequent screening and Pap tests.  The above recommendations are the same for women who have or have not gotten the vaccine for human papillomavirus (HPV).  If you had a hysterectomy for a problem that was not cancer or a condition that could lead to cancer, then you no longer need Pap tests. Even if you no longer need a Pap test, a regular exam is a good idea to make sure no other problems are starting.  If you are between ages  65 and 70, and you have had normal Pap tests going back 10 years, you no longer need Pap tests. Even if you no longer need a Pap test, a regular exam is a good idea to make sure no other problems are starting.  If you have had past treatment for cervical cancer or a condition that could lead to cancer, you need Pap tests and screening for cancer for at least 20 years after your treatment.  If Pap tests have been discontinued, risk factors (such as a new sexual partner) need to be reassessed to determine if screening should be resumed.  The HPV test is an additional test that  may be used for cervical cancer screening. The HPV test looks for the virus that can cause the cell changes on the cervix. The cells collected during the Pap test can be tested for HPV. The HPV test could be used to screen women aged 85 years and older, and should be used in women of any age who have unclear Pap test results. After the age of 14, women should have HPV testing at the same frequency as a Pap test.  Colorectal cancer can be detected and often prevented. Most routine colorectal cancer screening begins at the age of 59 and continues through age 44. However, your caregiver may recommend screening at an earlier age if you have risk factors for colon cancer. On a yearly basis, your caregiver may provide home test kits to check for hidden blood in the stool. Use of a small camera at the end of a tube, to directly examine the colon (sigmoidoscopy or colonoscopy), can detect the earliest forms of colorectal cancer. Talk to your caregiver about this at age 3, when routine screening begins. Direct examination of the colon should be repeated every 5 to 10 years through age 84, unless early forms of pre-cancerous polyps or small growths are found.  Hepatitis C blood testing is recommended for all people born from [birth year] through 1965 and any individual with known risks for hepatitis C.  Practice safe sex. Use condoms and avoid high-risk sexual practices to reduce the spread of sexually transmitted infections (STIs). STIs include gonorrhea, chlamydia, syphilis, trichomonas, herpes, HPV, and human immunodeficiency virus (HIV). Herpes, HIV, and HPV are viral illnesses that have no cure. They can result in disability, cancer, and death. Sexually active women aged 58 and younger should be checked for chlamydia. Older women with new or multiple partners should also be tested for chlamydia. Testing for other STIs is recommended if you are sexually active and at increased risk.  Osteoporosis is a disease in which  the bones lose minerals and strength with aging. This can result in serious bone fractures. The risk of osteoporosis can be identified using a bone density scan. Women ages 55 and over and women at risk for fractures or osteoporosis should discuss screening with their caregivers. Ask your caregiver whether you should take a calcium supplement or vitamin D to reduce the rate of osteoporosis.  Menopause can be associated with physical symptoms and risks. Hormone replacement therapy is available to decrease symptoms and risks. You should talk to your caregiver about whether hormone replacement therapy is right for you.  Use sunscreen with sun protection factor (SPF) of 30 or more. Apply sunscreen liberally and repeatedly throughout the day. You should seek shade when your shadow is shorter than you. Protect yourself by wearing long sleeves, pants, a wide-brimmed hat, and sunglasses year round, whenever you are outdoors.  Once  a month, do a whole body skin exam, using a mirror to look at the skin on your back. Notify your caregiver of new moles, moles that have irregular borders, moles that are larger than a pencil eraser, or moles that have changed in shape or color.  Stay current with required immunizations.  Influenza. You need a dose every fall (or winter). The composition of the flu vaccine changes each year, so being vaccinated once is not enough.  Pneumococcal polysaccharide. You need 1 to 2 doses if you smoke cigarettes or if you have certain chronic medical conditions. You need 1 dose at age 30 (or older) if you have never been vaccinated.  Tetanus, diphtheria, pertussis (Tdap, Td). Get 1 dose of Tdap vaccine if you are younger than age 70, are over 54 and have contact with an infant, are a Research scientist (physical sciences), are pregnant, or simply want to be protected from whooping cough. After that, you need a Td booster dose every 10 years. Consult your caregiver if you have not had at least 3 tetanus and  diphtheria-containing shots sometime in your life or have a deep or dirty wound.  HPV. You need this vaccine if you are a woman age 69 or younger. The vaccine is given in 3 doses over 6 months.  Measles, mumps, rubella (MMR). You need at least 1 dose of MMR if you were born in 1957 or later. You may also need a second dose.  Meningococcal. If you are age 25 to 27 and a first-year college student living in a residence hall, or have one of several medical conditions, you need to get vaccinated against meningococcal disease. You may also need additional booster doses.  Zoster (shingles). If you are age 43 or older, you should get this vaccine.  Varicella (chickenpox). If you have never had chickenpox or you were vaccinated but received only 1 dose, talk to your caregiver to find out if you need this vaccine.  Hepatitis A. You need this vaccine if you have a specific risk factor for hepatitis A virus infection or you simply wish to be protected from this disease. The vaccine is usually given as 2 doses, 6 to 18 months apart.  Hepatitis B. You need this vaccine if you have a specific risk factor for hepatitis B virus infection or you simply wish to be protected from this disease. The vaccine is given in 3 doses, usually over 6 months. Preventive Services / Frequency Ages 21 to 37  Blood pressure check.** / Every 1 to 2 years.  Lipid and cholesterol check.** / Every 5 years beginning at age 19.  Clinical breast exam.** / Every 3 years for women in their 43s and 30s.  Pap test.** / Every 2 years from ages 84 through 28. Every 3 years starting at age 32 through age 28 or 55 with a history of 3 consecutive normal Pap tests.  HPV screening.** / Every 3 years from ages 81 through ages 68 to 68 with a history of 3 consecutive normal Pap tests.  Hepatitis C blood test.** / For any individual with known risks for hepatitis C.  Skin self-exam. / Monthly.  Influenza immunization.** / Every  year.  Pneumococcal polysaccharide immunization.** / 1 to 2 doses if you smoke cigarettes or if you have certain chronic medical conditions.  Tetanus, diphtheria, pertussis (Tdap, Td) immunization. / A one-time dose of Tdap vaccine. After that, you need a Td booster dose every 10 years.  HPV immunization. / 3 doses over  6 months, if you are 43 and younger.  Measles, mumps, rubella (MMR) immunization. / You need at least 1 dose of MMR if you were born in 1957 or later. You may also need a second dose.  Meningococcal immunization. / 1 dose if you are age 32 to 47 and a first-year college student living in a residence hall, or have one of several medical conditions, you need to get vaccinated against meningococcal disease. You may also need additional booster doses.  Varicella immunization.** / Consult your caregiver.  Hepatitis A immunization.** / Consult your caregiver. 2 doses, 6 to 18 months apart.  Hepatitis B immunization.** / Consult your caregiver. 3 doses usually over 6 months. Ages 84 to 36  Blood pressure check.** / Every 1 to 2 years.  Lipid and cholesterol check.** / Every 5 years beginning at age 55.  Clinical breast exam.** / Every year after age 86.  Mammogram.** / Every year beginning at age 1 and continuing for as long as you are in good health. Consult with your caregiver.  Pap test.** / Every 3 years starting at age 37 through age 50 or 16 with a history of 3 consecutive normal Pap tests.  HPV screening.** / Every 3 years from ages 2 through ages 42 to 43 with a history of 3 consecutive normal Pap tests.  Fecal occult blood test (FOBT) of stool. / Every year beginning at age 68 and continuing until age 42. You may not need to do this test if you get a colonoscopy every 10 years.  Flexible sigmoidoscopy or colonoscopy.** / Every 5 years for a flexible sigmoidoscopy or every 10 years for a colonoscopy beginning at age 72 and continuing until age 57.  Hepatitis C  blood test.** / For all people born from [birth year] through 1967 and any individual with known risks for hepatitis C.  Skin self-exam. / Monthly.  Influenza immunization.** / Every year.  Pneumococcal polysaccharide immunization.** / 1 to 2 doses if you smoke cigarettes or if you have certain chronic medical conditions.  Tetanus, diphtheria, pertussis (Tdap, Td) immunization.** / A one-time dose of Tdap vaccine. After that, you need a Td booster dose every 10 years.  Measles, mumps, rubella (MMR) immunization. / You need at least 1 dose of MMR if you were born in 1957 or later. You may also need a second dose.  Varicella immunization.** / Consult your caregiver.  Meningococcal immunization.** / Consult your caregiver.  Hepatitis A immunization.** / Consult your caregiver. 2 doses, 6 to 18 months apart.  Hepatitis B immunization.** / Consult your caregiver. 3 doses, usually over 6 months. Ages 14 and over  Blood pressure check.** / Every 1 to 2 years.  Lipid and cholesterol check.** / Every 5 years beginning at age 45.  Clinical breast exam.** / Every year after age 54.  Mammogram.** / Every year beginning at age 25 and continuing for as long as you are in good health. Consult with your caregiver.  Pap test.** / Every 3 years starting at age 14 through age 46 or 8 with a 3 consecutive normal Pap tests. Testing can be stopped between 65 and 70 with 3 consecutive normal Pap tests and no abnormal Pap or HPV tests in the past 10 years.  HPV screening.** / Every 3 years from ages 10 through ages 65 or 43 with a history of 3 consecutive normal Pap tests. Testing can be stopped between 65 and 70 with 3 consecutive normal Pap tests and no abnormal Pap  or HPV tests in the past 10 years.  Fecal occult blood test (FOBT) of stool. / Every year beginning at age 40 and continuing until age 20. You may not need to do this test if you get a colonoscopy every 10 years.  Flexible sigmoidoscopy or  colonoscopy.** / Every 5 years for a flexible sigmoidoscopy or every 10 years for a colonoscopy beginning at age 76 and continuing until age 23.  Hepatitis C blood test.** / For all people born from [birth year] through 1967 and any individual with known risks for hepatitis C.  Osteoporosis screening.** / A one-time screening for women ages 17 and over and women at risk for fractures or osteoporosis.  Skin self-exam. / Monthly.  Influenza immunization.** / Every year.  Pneumococcal polysaccharide immunization.** / 1 dose at age 43 (or older) if you have never been vaccinated.  Tetanus, diphtheria, pertussis (Tdap, Td) immunization. / A one-time dose of Tdap vaccine if you are over 65 and have contact with an infant, are a Research scientist (physical sciences), or simply want to be protected from whooping cough. After that, you need a Td booster dose every 10 years.  Varicella immunization.** / Consult your caregiver.  Meningococcal immunization.** / Consult your caregiver.  Hepatitis A immunization.** / Consult your caregiver. 2 doses, 6 to 18 months apart.  Hepatitis B immunization.** / Check with your caregiver. 3 doses, usually over 6 months. ** Family history and personal history of risk and conditions may change your caregiver's recommendations. Document Released: 11/01/2001 Document Revised: 11/28/2011 Document Reviewed: 01/31/2011 [facility] Patient Information 2014 [place], [place].     Neta Mends. Everley Evora M.D. Health Maintenance  Topic Date Due  . Pap Smear  08/23/1967  . Influenza Vaccine  05/20/2013  . Mammogram  08/30/2014  . Colonoscopy  11/05/2020  . Tetanus/tdap  02/14/2023  . Zostavax  Completed   Health Maintenance Review

## 2013-02-19 ENCOUNTER — Encounter: Payer: Self-pay | Admitting: Internal Medicine

## 2013-02-24 NOTE — Progress Notes (Signed)
Quick Note:  Tell patient PAP is normal. ______ 

## 2013-02-25 ENCOUNTER — Encounter: Payer: Self-pay | Admitting: Family Medicine

## 2013-04-11 ENCOUNTER — Encounter: Payer: Self-pay | Admitting: Internal Medicine

## 2013-04-15 ENCOUNTER — Ambulatory Visit (INDEPENDENT_AMBULATORY_CARE_PROVIDER_SITE_OTHER): Payer: PRIVATE HEALTH INSURANCE | Admitting: Internal Medicine

## 2013-04-15 ENCOUNTER — Encounter: Payer: Self-pay | Admitting: Internal Medicine

## 2013-04-15 VITALS — BP 112/72 | HR 92 | Temp 98.4°F | Wt 208.0 lb

## 2013-04-15 DIAGNOSIS — S8990XA Unspecified injury of unspecified lower leg, initial encounter: Secondary | ICD-10-CM

## 2013-04-15 DIAGNOSIS — S99912A Unspecified injury of left ankle, initial encounter: Secondary | ICD-10-CM

## 2013-04-15 NOTE — Progress Notes (Signed)
Chief Complaint  Patient presents with  . Ankle Pain    Rt side.  Started 2 weeks ago.  Was doing yoga.    HPI: Patient comes in today for SDA for  new problem evaluation. 2 weeks ago doing yoga move in confined space and had pain and swelling left ankle  And hard to walk but could weight bear. Was swollen  No pop. Better but still swollen and tender.  hc bunion surgery and flat arch on that foot anyway but no other injury.   Has been using ice and otc  figure 8 wrap. ROS: See pertinent positives and negatives per HPI.  Past Medical History  Diagnosis Date  . Bowen's disease     excised 1990  . Hx of varicella     Family History  Problem Relation Age of Onset  . Breast cancer      mom and MGM and sister   . Pancreatitis Father     History   Social History  . Marital Status: Married    Spouse Name: N/A    Number of Children: N/A  . Years of Education: N/A   Social History Main Topics  . Smoking status: Never Smoker   . Smokeless tobacco: None  . Alcohol Use: Yes  . Drug Use: Yes    Special: Marijuana  . Sexually Active: None   Other Topics Concern  . None   Social History Narrative   H H  of 2      5 pets.   She is a former smoker   Retired Medical laboratory scientific officer fine arts   Husband smokes he has active heart disease and vascular disease   etoh   Red wine  1 per night.    Tea green tea and earl gray    Moved from [place] to [facility] area in 1985   1 pregnancy                   Outpatient Encounter Prescriptions as of 04/15/2013  Medication Sig Dispense Refill  . aspirin 81 MG tablet Take 81 mg by mouth daily.      . Calcium-Magnesium-Vitamin D (CALCIUM MAGNESIUM PO) Take by mouth.      . Cholecalciferol (VITAMIN D-3 PO) Take by mouth. 1000 units      . GINSENG KOREAN PO Take by mouth.      . fluocinonide-emollient (LIDEX-E) 0.05 % cream Apply topically 2 (two) times daily. To rash  Not on face .  15 g  1   No facility-administered  encounter medications on file as of 04/15/2013.    EXAM:  BP 112/72  Pulse 92  Temp(Src) 98.4 F (36.9 C) (Oral)  Wt 208 lb (94.348 kg)  BMI 37.41 kg/m2  SpO2 98%  Body mass index is 37.41 kg/(m^2).  GENERAL: vitals reviewed and listed above, alert, oriented, appears well hydrated and in no acute distress  HEENT: atraumatic, conjunctiva  clear, no obvious abnormalities on inspection of external nose and ears  MS: moves all extremities limping favoring left foot but can weight bear  .swelling and tenderness medical malleolus and down to the arch mid foot    Achilles ? Ok  NV seems intact  Healed bunion scar noted.  No bruising . PSYCH: pleasant and cooperative, no obvious depression or anxiety  ASSESSMENT AND PLAN:  Discussed the following assessment and plan:  Ankle injury, left, initial encounter - Plan: Ambulatory referral to Sports Medicine Concern about location  (Medial and  arch )and amount  swelling   .   Refer to sm about immobilization and rehab and foot mechanic issue . Want to avoid chronic  Problem .  -Patient advised to return or notify health care team  if symptoms worsen or persist or new concerns arise.  Patient Instructions  Am concern about medial ankle strain and or tendon injury    .  Because of the location of the swelling.   Advise sports medicine to see your foot  To give best advice for rehab and avoiding future chronic problem .  Continue ice and support in the meantime. You will be contacted about  Appt. But can call on       Eithel Ryall K. Dade Rodin M.D.

## 2013-04-15 NOTE — Patient Instructions (Signed)
Am concern about medial ankle strain and or tendon injury    .  Because of the location of the swelling.   Advise sports medicine to see your foot  To give best advice for rehab and avoiding future chronic problem .  Continue ice and support in the meantime. You will be contacted about  Appt. But can call on

## 2013-04-23 ENCOUNTER — Ambulatory Visit (INDEPENDENT_AMBULATORY_CARE_PROVIDER_SITE_OTHER): Payer: PRIVATE HEALTH INSURANCE | Admitting: Family Medicine

## 2013-04-23 VITALS — BP 126/80 | Ht 63.5 in | Wt 200.0 lb

## 2013-04-23 DIAGNOSIS — M25571 Pain in right ankle and joints of right foot: Secondary | ICD-10-CM

## 2013-04-23 DIAGNOSIS — M25539 Pain in unspecified wrist: Secondary | ICD-10-CM

## 2013-04-23 DIAGNOSIS — M25579 Pain in unspecified ankle and joints of unspecified foot: Secondary | ICD-10-CM | POA: Insufficient documentation

## 2013-04-23 DIAGNOSIS — M25532 Pain in left wrist: Secondary | ICD-10-CM

## 2013-04-23 NOTE — Patient Instructions (Signed)
Thanks for coming in today 1. For ankle pain, wear CAM walker boot for 3 weeks. Get xrays tomorrow. Take 2 Aleve 2x per day with food. 2. For wrist pain, we think that you have chipped a bone fragment off. Recommend wearing your wrist supports. Listen to your body. Ice for 20 min 2 x per day.

## 2013-04-24 ENCOUNTER — Ambulatory Visit
Admission: RE | Admit: 2013-04-24 | Discharge: 2013-04-24 | Disposition: A | Discharge status: Home / Self Care | Payer: PRIVATE HEALTH INSURANCE | Source: Ambulatory Visit | Attending: Family Medicine | Admitting: Family Medicine

## 2013-04-24 DIAGNOSIS — M25571 Pain in right ankle and joints of right foot: Secondary | ICD-10-CM

## 2013-04-24 DIAGNOSIS — M25532 Pain in left wrist: Secondary | ICD-10-CM
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
## 2013-04-26 ENCOUNTER — Telehealth: Payer: Self-pay | Admitting: Family Medicine

## 2013-04-26 NOTE — Telephone Encounter (Signed)
Called patient to discuss MRI results. Neg for fracture. Recommended that patient wear CAM boot until followup. Also recommend that patient minimize lifting or impact on wrist until followup. She will see me in late August.

## 2013-05-14 ENCOUNTER — Ambulatory Visit (INDEPENDENT_AMBULATORY_CARE_PROVIDER_SITE_OTHER): Payer: PRIVATE HEALTH INSURANCE | Admitting: Family Medicine

## 2013-05-14 ENCOUNTER — Encounter: Payer: Self-pay | Admitting: Family Medicine

## 2013-05-14 VITALS — BP 121/84 | HR 72 | Ht 63.0 in | Wt 200.0 lb

## 2013-05-14 DIAGNOSIS — M79646 Pain in unspecified finger(s): Secondary | ICD-10-CM

## 2013-05-14 DIAGNOSIS — M25579 Pain in unspecified ankle and joints of unspecified foot: Secondary | ICD-10-CM

## 2013-05-14 DIAGNOSIS — M79609 Pain in unspecified limb: Secondary | ICD-10-CM

## 2013-05-14 DIAGNOSIS — M25571 Pain in right ankle and joints of right foot: Secondary | ICD-10-CM

## 2013-05-14 MED ORDER — DICLOFENAC SODIUM 1 % TD GEL: 2.0000 g | Freq: Four times a day (QID) | TRANSDERMAL | Status: DC

## 2013-05-14 NOTE — Patient Instructions (Signed)
Thank you for coming in today  For your ankle, stop the boot Use compression sleeve when you are on your feet Continue ice as needed Start ankle rehab with ROM, strength with theraband, and proprioception/balance  For thumbs, Continue brace Consider voltaren gel Try topical CAPSAICIN (Capsin) Followup in 2 weeks if you want injection.
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
## 2013-07-24 ENCOUNTER — Other Ambulatory Visit: Payer: Self-pay

## 2013-07-24 DIAGNOSIS — Z1231 Encounter for screening mammogram for malignant neoplasm of breast: Secondary | ICD-10-CM

## 2013-07-25 ENCOUNTER — Other Ambulatory Visit: Payer: Self-pay

## 2013-09-02 ENCOUNTER — Ambulatory Visit: Payer: PRIVATE HEALTH INSURANCE

## 2013-09-05 ENCOUNTER — Ambulatory Visit
Admission: RE | Admit: 2013-09-05 | Discharge: 2013-09-05 | Disposition: A | Discharge status: Home / Self Care | Payer: PRIVATE HEALTH INSURANCE | Source: Ambulatory Visit

## 2013-09-05 DIAGNOSIS — Z1231 Encounter for screening mammogram for malignant neoplasm of breast: Secondary | ICD-10-CM

## 2014-08-11 ENCOUNTER — Other Ambulatory Visit: Payer: Self-pay

## 2014-08-11 DIAGNOSIS — Z1231 Encounter for screening mammogram for malignant neoplasm of breast: Secondary | ICD-10-CM

## 2014-09-08 ENCOUNTER — Ambulatory Visit: Payer: PRIVATE HEALTH INSURANCE

## 2014-09-23 ENCOUNTER — Ambulatory Visit
Admission: RE | Admit: 2014-09-23 | Discharge: 2014-09-23 | Disposition: A | Discharge status: Home / Self Care | Payer: Medicare Other | Source: Ambulatory Visit

## 2014-09-23 DIAGNOSIS — Z1231 Encounter for screening mammogram for malignant neoplasm of breast: Secondary | ICD-10-CM

## 2015-01-29 ENCOUNTER — Encounter (INDEPENDENT_AMBULATORY_CARE_PROVIDER_SITE_OTHER): Payer: Commercial Managed Care - HMO | Admitting: Internal Medicine

## 2015-01-29 ENCOUNTER — Ambulatory Visit (INDEPENDENT_AMBULATORY_CARE_PROVIDER_SITE_OTHER): Payer: Commercial Managed Care - HMO | Admitting: Family Medicine

## 2015-01-29 ENCOUNTER — Encounter: Payer: Self-pay | Admitting: Family Medicine

## 2015-01-29 VITALS — BP 124/80 | HR 90 | Temp 98.1°F | Wt 204.0 lb

## 2015-01-29 DIAGNOSIS — G4452 New daily persistent headache (NDPH): Secondary | ICD-10-CM

## 2015-01-29 NOTE — Patient Instructions (Signed)
Headache and Arthritis Headaches and arthritis are common problems. This causes an interest in the possible role of arthritis in causing headaches. Several major forms of arthritis exist. Two of the most common types are:  Rheumatoid arthritis.  Osteoarthritis. Rheumatoid arthritis may begin at any age. It is a condition in which the body attacks some of its own tissues, thinking they do not belong. This leads to destruction of the bony areas around the joints. This condition may afflict any of the body's joints. It usually produces a deformity of the joint. The hands and fingers no longer appear straight but often appear angled towards one side. In some cases, the spine may be involved. Most often it is the vertebrae of the neck (cervical spine). The areas of the neck most commonly afflicted by rheumatoid arthritis are the first and second cervical vertebrae. Curiously, rheumatoid arthritis, though it often produces severe deformities, is not always painful.  The more common form of arthritis is osteoarthritis. It is a wear-and-tear form of arthritis. It usually does not produce deformity of the joints or destruction of the bony tissues. Rather the ligaments weaken. They may be calcified due to the body's attempt to heal the damage. The larger joints of the body and those joints that take the most stress and strain are the most often affected. In the neck region this osteoarthritis usually involves the fifth, sixth and seventh vertebrae. This is because the effects of posture produce the most fatigue on them. Osteoarthritis is often more painful than rheumatoid arthritis.  During workups for arthritis, a test evaluating inflammation, (the sedimentation rate) often is performed. In rheumatoid arthritis, this test will usually be elevated. Other tests for inflammation may also be elevated. In patients with osteoarthritis, x-rays of the neck or jaw joints will show changes from "lipping" of the vertebrae. This  is caused by calcium deposits in the ligaments. Or they may show narrowing of the space between the vertebrae, or spur formation (from calcium deposits). If severe, it may cause obstruction of the holes where the nerves pass from the spine to the body. In rheumatoid arthritis, dislocation of vertebrae may occur in the upper neck. CT scan and MRI in patients with osteoarthritis may show bulging of the discs that cushion the vertebrae. In the most severe cases, herniation of the discs may occur.  Headaches, felt as a pain in the neck, may be caused by arthritis if the first, second or third vertebrae are involved. This condition is due to the nerves that supply the scalp only originating from this area of the spine. Neck pain itself, whether alone or coupled with headaches, can involve any portion of the neck. If the jaw is involved, the symptoms are similar to those of Temporomandibular Joint Syndrome (TMJ).  The progressive severity of rheumatoid arthritis may be slowed by a variety of potent medications. In osteoarthritis, its progression is not usually hindered by medication. The following may be helpful in slowing the advancement of the disorder:  Lifestyle adjustment.  Exercise.  Rest.  Weight loss. Medications, such as the nonsteroidal anti-inflammatory agents (NSAIDs), are useful. They may reduce the pain and improve the reduced motion which occurs in joints afflicted by arthritis. From some studies, the use of acetaminophen appears to be as effective in controlling the pain of arthritis as the NSAIDs. Physical modalities may also be useful for arthritis. They include:  Heat.  Massage.  Exercise. But physical therapy must be prescribed by a caregiver, just as most medications  for arthritis.  Document Released: 11/26/2003 Document Revised: 09/10/2013 Document Reviewed: 12/09/2013 [facility] Patient Information 2015 [place], [place]. This information is not intended to replace advice given to  you by your health care provider. Make sure you discuss any questions you have with your health care provider.

## 2015-01-29 NOTE — Progress Notes (Signed)
Document opened and reviewed for OV but appt  canceled same day .because was 45 minute for appt seen later in day.

## 2015-01-29 NOTE — Progress Notes (Signed)
Pre visit review using our clinic review tool, if applicable. No additional management support is needed unless otherwise documented below in the visit note. 

## 2015-01-29 NOTE — Progress Notes (Signed)
   Subjective:    Patient ID: Ashley Moore, female    DOB: Oct 16, 1950, 64 y.o.   MRN: [id number]  HPI Patient seen with headache. Intermittent for the past 3 months and now almost daily. She describes a pain which is mostly left occipital area and radiates somewhat anterior. She describes as "sharp and throbbing". Left side greater than right. She has not noticed any scalp tenderness but has noticed some tenderness in her cervical neck region. No classic radiculopathy symptoms. Increased stress with husband who has multiple health issues. She thinks this may be exacerbating. She denies any sinusitis symptoms. She does have some sinus congestion and has had mostly clear drainage. Frequent sneezing recently.  She's not had any associated features such as appetite or weight change, nausea, vomiting, visual changes, or any focal weakness. She has tried heat without much improvement. Went for muscle massage once which helped only temporarily for less than a day. She is taking Aleve almost daily.  Past Medical History  Diagnosis Date  . Bowen's disease     excised 1990  . Hx of varicella    Past Surgical History  Procedure Laterality Date  . Bunionectomy Right   . Skin cancer excision      bowens disease    reports that she has never smoked. She does not have any smokeless tobacco history on file. She reports that she drinks alcohol. She reports that she uses illicit drugs (Marijuana). family history includes Breast cancer in an other family member; Pancreatitis in her father. Allergies  Allergen Reactions  . Penicillins Rash      Review of Systems  Constitutional: Negative for appetite change and unexpected weight change.  Eyes: Negative for visual disturbance.  Respiratory: Negative for cough and shortness of breath.   Cardiovascular: Negative for chest pain.  Gastrointestinal: Negative for abdominal pain.  Musculoskeletal: Positive for neck pain.  Neurological: Positive for  headaches. Negative for dizziness, syncope and weakness.  Psychiatric/Behavioral: Negative for confusion.       Objective:   Physical Exam  Constitutional: She is oriented to person, place, and time. She appears well-developed and well-nourished.  HENT:  Head: Normocephalic and atraumatic.  Eyes: Pupils are equal, round, and reactive to light.  Neck: Neck supple.  Cardiovascular: Normal rate and regular rhythm.   Pulmonary/Chest: Effort normal and breath sounds normal. No respiratory distress. She has no wheezes. She has no rales.  Musculoskeletal:  Somewhat limited range of motion with rotation to the left and right.  Neurological: She is alert and oriented to person, place, and time. No cranial nerve deficit. Coordination normal.  Full strength upper extremities with symmetric reflexes. Gait normal.          Assessment & Plan:  Occipital headaches. ?etiology. She does not have past history of headaches. 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 No red flags such as appetite or weight change or any focal neurologic deficits. Avoid daily use of analgesics.  Set up neurology referral.

## 2015-03-11 ENCOUNTER — Encounter: Payer: Self-pay | Admitting: Internal Medicine

## 2015-03-11 ENCOUNTER — Ambulatory Visit (INDEPENDENT_AMBULATORY_CARE_PROVIDER_SITE_OTHER): Payer: Commercial Managed Care - HMO | Admitting: Internal Medicine

## 2015-03-11 VITALS — BP 124/80 | Temp 98.2°F | Ht 62.0 in | Wt 201.7 lb

## 2015-03-11 DIAGNOSIS — Z634 Disappearance and death of family member: Secondary | ICD-10-CM

## 2015-03-11 DIAGNOSIS — Z0000 Encounter for general adult medical examination without abnormal findings: Secondary | ICD-10-CM

## 2015-03-11 DIAGNOSIS — Z808 Family history of malignant neoplasm of other organs or systems: Secondary | ICD-10-CM

## 2015-03-11 DIAGNOSIS — G4452 New daily persistent headache (NDPH): Secondary | ICD-10-CM

## 2015-03-11 DIAGNOSIS — Z23 Encounter for immunization: Secondary | ICD-10-CM

## 2015-03-11 DIAGNOSIS — E2839 Other primary ovarian failure: Secondary | ICD-10-CM

## 2015-03-11 LAB — POCT URINALYSIS DIP (MANUAL ENTRY)
Bilirubin, UA: NEGATIVE
Blood, UA: NEGATIVE
Glucose, UA: NEGATIVE
Nitrite, UA: NEGATIVE
PH UA: 5.5
Protein Ur, POC: NEGATIVE
Spec Grav, UA: 1.025
UROBILINOGEN UA: 0.2

## 2015-03-11 LAB — LIPID PANEL
CHOLESTEROL: 165 mg/dL (ref 0–200)
HDL: 67.3 mg/dL (ref >39.00)
LDL Cholesterol: 85 mg/dL (ref 0–99)
NonHDL: 97.7
TRIGLYCERIDES: 66 mg/dL (ref 0.0–149.0)
Total CHOL/HDL Ratio: 2
VLDL: 13.2 mg/dL (ref 0.0–40.0)

## 2015-03-11 LAB — CBC WITH DIFFERENTIAL/PLATELET
BASOS PCT: 0.5 % (ref 0.0–3.0)
Basophils Absolute: 0 10*3/uL (ref 0.0–0.1)
EOS ABS: 0.2 10*3/uL (ref 0.0–0.7)
Eosinophils Relative: 2.7 % (ref 0.0–5.0)
HCT: 40.2 % (ref 36.0–46.0)
HEMOGLOBIN: 13.3 g/dL (ref 12.0–15.0)
LYMPHS PCT: 35.6 % (ref 12.0–46.0)
Lymphs Abs: 2.4 10*3/uL (ref 0.7–4.0)
MCHC: 33.1 g/dL (ref 30.0–36.0)
MCV: 83.9 fl (ref 78.0–100.0)
Monocytes Absolute: 0.5 10*3/uL (ref 0.1–1.0)
Monocytes Relative: 7 % (ref 3.0–12.0)
Neutro Abs: 3.7 10*3/uL (ref 1.4–7.7)
Neutrophils Relative %: 54.2 % (ref 43.0–77.0)
Platelets: 211 10*3/uL (ref 150.0–400.0)
RBC: 4.79 Mil/uL (ref 3.87–5.11)
RDW: 14.6 % (ref 11.5–15.5)
WBC: 6.8 10*3/uL (ref 4.0–10.5)

## 2015-03-11 LAB — HEPATIC FUNCTION PANEL
ALK PHOS: 69 U/L (ref 39–117)
ALT: 20 U/L (ref 0–35)
AST: 28 U/L (ref 0–37)
Albumin: 4 g/dL (ref 3.5–5.2)
BILIRUBIN DIRECT: 0.1 mg/dL (ref 0.0–0.3)
BILIRUBIN TOTAL: 0.5 mg/dL (ref 0.2–1.2)
TOTAL PROTEIN: 7.4 g/dL (ref 6.0–8.3)

## 2015-03-11 LAB — TSH: TSH: 3.29 u[IU]/mL (ref 0.35–4.50)

## 2015-03-11 LAB — BASIC METABOLIC PANEL
BUN: 16 mg/dL (ref 6–23)
CHLORIDE: 105 meq/L (ref 96–112)
CO2: 27 meq/L (ref 19–32)
Calcium: 9.4 mg/dL (ref 8.4–10.5)
Creatinine, Ser: 0.7 mg/dL (ref 0.40–1.20)
GFR: 89.11 mL/min (ref >60.00)
Glucose, Bld: 86 mg/dL (ref 70–99)
Potassium: 4.1 mEq/L (ref 3.5–5.1)
Sodium: 138 mEq/L (ref 135–145)

## 2015-03-11 NOTE — Patient Instructions (Addendum)
Keep  Headache appt .  See counselor about multiple losses  We can add medication if  Appropriate .  pGet dexa scan.   Make appt . prevnar 13  .Marland KitchenMarland KitchenToday   pneumovax in 1 year .Marland Kitchen Will notify you  of labs when available. Advise ROV  If needed depending on labs and  Results and  How you are doing .      Healthy lifestyle includes : At least 150 minutes of exercise weeks  , weight at healthy levels, which is usually   BMI 19-25. Avoid trans fats and processed foods;  Increase fresh fruits and veges to 5 servings per day. And avoid sweet beverages including tea and juice. Mediterranean diet with olive oil and nuts have been noted to be heart and brain healthy . Avoid tobacco products . Limit  alcohol to  7 per week for women and 14 servings for men.  Get adequate sleep . Wear seat belts . Don't text and drive .         Why follow it? Research shows. . Those who follow the Mediterranean diet have a reduced risk of heart disease  . The diet is associated with a reduced incidence of Parkinson's and Alzheimer's diseases . People following the diet may have longer life expectancies and lower rates of chronic diseases  . The Dietary Guidelines for Americans recommends the Mediterranean diet as an eating plan to promote health and prevent disease  What Is the Mediterranean Diet?  . Healthy eating plan based on typical foods and recipes of Mediterranean-style cooking . The diet is primarily a plant based diet; these foods should make up a majority of meals   Starches - Plant based foods should make up a majority of meals - They are an important sources of vitamins, minerals, energy, antioxidants, and fiber - Choose whole grains, foods high in fiber and minimally processed items  - Typical grain sources include wheat, oats, barley, corn, brown rice, bulgar, farro, millet, polenta, couscous  - Various types of beans include chickpeas, lentils, fava beans, black beans, white beans   Fruits   Veggies - Large quantities of antioxidant rich fruits & veggies; 6 or more servings  - Vegetables can be eaten raw or lightly drizzled with oil and cooked  - Vegetables common to the traditional Mediterranean Diet include: artichokes, arugula, beets, broccoli, brussel sprouts, cabbage, carrots, celery, collard greens, cucumbers, eggplant, kale, leeks, lemons, lettuce, mushrooms, okra, onions, peas, peppers, potatoes, pumpkin, radishes, rutabaga, shallots, spinach, sweet potatoes, turnips, zucchini - Fruits common to the Mediterranean Diet include: apples, apricots, avocados, cherries, clementines, dates, figs, grapefruits, grapes, melons, nectarines, oranges, peaches, pears, pomegranates, strawberries, tangerines  Fats - Replace butter and margarine with healthy oils, such as olive oil, canola oil, and tahini  - Limit nuts to no more than a handful a day  - Nuts include walnuts, almonds, pecans, pistachios, pine nuts  - Limit or avoid candied, honey roasted or heavily salted nuts - Olives are central to the [employer] - can be eaten whole or used in a variety of dishes   Meats Protein - Limiting red meat: no more than a few times a month - When eating red meat: choose lean cuts and keep the portion to the size of deck of cards - Eggs: approx. 0 to 4 times a week  - Fish and lean poultry: at least 2 a week  - Healthy protein sources include, chicken, [place], lean beef, lamb - Increase intake of  seafood such as tuna, salmon, trout, mackerel, shrimp, scallops - Avoid or limit high fat processed meats such as sausage and bacon  Dairy - Include moderate amounts of low fat dairy products  - Focus on healthy dairy such as fat free yogurt, skim milk, low or reduced fat cheese - Limit dairy products higher in fat such as whole or 2% milk, cheese, ice cream  Alcohol - Moderate amounts of red wine is ok  - No more than 5 oz daily for women (all ages) and men older than age 31  - No more than 10 oz  of wine daily for men younger than 11  Other - Limit sweets and other desserts  - Use herbs and spices instead of salt to flavor foods  - Herbs and spices common to the traditional Mediterranean Diet include: basil, bay leaves, chives, cloves, cumin, fennel, garlic, lavender, marjoram, mint, oregano, parsley, pepper, rosemary, sage, savory, sumac, tarragon, thyme   It's not just a diet, it's a lifestyle:  . The Mediterranean diet includes lifestyle factors typical of those in the region  . Foods, drinks and meals are best eaten with others and savored . Daily physical activity is important for overall good health . This could be strenuous exercise like running and aerobics . This could also be more leisurely activities such as walking, housework, yard-work, or taking the stairs . Moderation is the key; a balanced and healthy diet accommodates most foods and drinks . Consider portion sizes and frequency of consumption of certain foods   Meal Ideas & Options:  . Breakfast:  o Whole wheat toast or whole wheat English muffins with peanut butter & hard boiled egg o Steel cut oats topped with apples & cinnamon and skim milk  o Fresh fruit: banana, strawberries, melon, berries, peaches  o Smoothies: strawberries, bananas, greek yogurt, peanut butter o Low fat greek yogurt with blueberries and granola  o Egg white omelet with spinach and mushrooms o Breakfast couscous: whole wheat couscous, apricots, skim milk, cranberries  . Sandwiches:  o Hummus and grilled vegetables (peppers, zucchini, squash) on whole wheat bread   o Grilled chicken on whole wheat pita with lettuce, tomatoes, cucumbers or tzatziki  o Tuna salad on whole wheat bread: tuna salad made with greek yogurt, olives, red peppers, capers, green onions o Garlic rosemary lamb pita: lamb sauted with garlic, rosemary, salt & pepper; add lettuce, cucumber, greek yogurt to pita - flavor with lemon juice and black pepper  . Seafood:   o Mediterranean grilled salmon, seasoned with garlic, basil, parsley, lemon juice and black pepper o Shrimp, lemon, and spinach whole-grain pasta salad made with low fat greek yogurt  o Seared scallops with lemon orzo  o Seared tuna steaks seasoned salt, pepper, coriander topped with tomato mixture of olives, tomatoes, olive oil, minced garlic, parsley, green onions and cappers  . Meats:  o Herbed greek chicken salad with kalamata olives, cucumber, feta  o Red bell peppers stuffed with spinach, bulgur, lean ground beef (or lentils) & topped with feta   o Kebabs: skewers of chicken, tomatoes, onions, zucchini, squash  o [place] burgers: made with red onions, mint, dill, lemon juice, feta cheese topped with roasted red peppers . Vegetarian o Cucumber salad: cucumbers, artichoke hearts, celery, red onion, feta cheese, tossed in olive oil & lemon juice  o Hummus and whole grain pita points with a greek salad (lettuce, tomato, feta, olives, cucumbers, red onion) o Lentil soup with celery, carrots made with  vegetable broth, garlic, salt and pepper  o Tabouli salad: parsley, bulgur, mint, scallions, cucumbers, tomato, radishes, lemon juice, olive oil, salt and pepper.

## 2015-03-11 NOTE — Progress Notes (Signed)
Pre visit review using our clinic review tool, if applicable. No additional management support is needed unless otherwise documented below in the visit note.  Chief Complaint  Patient presents with  . Medicare Wellness    HPI: Ashley Moore 64 y.o. comes in today for Preventive Medicare wellness visit . Welcome to medicare since last visit. She has had some very stressful things going on. Her husband of 21 years died recently she had been caretaking over the last 5 years for multiple medical problems. multp strokes and fracture   Has been caretaker   .  Just found out she was left out of the will multiple things going on that her making things more difficult. Sister just died thei bone and breast cancer this weekend and   She is having headaches pretty much since February when her husband fell and she thinks she hurt her head trying to help him. She does think that mood makes it worse. She has an appointment in neurology June 30  Skin cancer :     Melanoma   noted in a sister. Her last Pap smear was 2 years ago. Health Maintenance  Topic Date Due  . DEXA SCAN  08/22/2014  . HIV Screening  02/18/2016 (Originally 08/22/1966)  . INFLUENZA VACCINE  04/20/2015  . PNA vac Low Risk Adult (2 of 2 - PPSV23) 03/10/2016  . MAMMOGRAM  09/23/2016  . COLONOSCOPY  11/05/2020  . TETANUS/TDAP  02/14/2023  . ZOSTAVAX  Completed   Health Maintenance Review LIFESTYLE:  Exercise:  Lifting and walking basically related to her work. Tobacco/ETS: No Alcohol: per day about one a day Sugar beverages: No Sleep: No sleep apnea noted Drug use: no MEDICARE DOCUMENT QUESTIONS  TO SCAN   Hearing: Okay  Vision:  No limitations at present . Last eye check UTD  Safety:  Has smoke detector and wears seat belts.  No firearms. No excess sun exposure. Sees dentist regularly.  Falls: Simple trip no injury.  Advance directive :  Reviewed  No  Will get information   HO given   Memory: Felt to be good  ,  no concern from her or her family.  Depression: No anhedonia  some stress and depression from 2 recent deaths has friends in family that are supportive   Nutrition: Eats diet; unrestricted could be healthier. adequate calcium and vitamin D. No swallowing chewing problems.  Injury: no major injuries in the last six months.  Other healthcare providers:  Reviewed today .  Social:  Recently widowed No pets.   Preventive parameters: up-to-date  Reviewed   ADLS:   There are no problems or need for assistance  driving, feeding, obtaining food, dressing, toileting and bathing, managing money using phone. She is independent. Has a ring business Environmental consultant.  ROS:  GEN/ HEENT: No fever, significant weight changes sweats headaches vision problems hearing changes, CV/ PULM; No chest pain shortness of breath cough, syncope,edema  change in exercise tolerance. GI /GU: No adominal pain, vomiting, change in bowel habits. No blood in the stool. No significant GU symptoms. SKIN/HEME: ,no acute skin rashes suspicious lesions or bleeding. No lymphadenopathy, nodules, masses.  NEURO/ PSYCH:  No neurologic signs such as weakness numbness. No depression anxiety. IMM/ Allergy: No unusual infections.  Allergy .   REST of 12 system review negative except as per HPI   Past Medical History  Diagnosis Date  . Bowen's disease     excised 1990  . Hx of varicella  Family History  Problem Relation Age of Onset  . Breast cancer      mom and MGM and sister   . Pancreatitis Father     History   Social History  . Marital Status: Married    Spouse Name: N/A  . Number of Children: N/A  . Years of Education: N/A   Social History Main Topics  . Smoking status: Never Smoker   . Smokeless tobacco: Not on file  . Alcohol Use: Yes  . Drug Use: Yes    Special: Marijuana  . Sexual Activity: Not on file   Other Topics Concern  . Not on file   Social History Narrative   H H  of 1      5 pets.    She is a former smoker   Retired Engineer, mining fine arts   Husband smokes he has active heart disease and vascular disease   etoh   Red wine  1 per night.    Tea green tea and earl gray    Moved from [place] to [place] area in [place], 1983   1 pregnancy   Husband died spring  2016   Sister died 03/31/2015                 Outpatient Encounter Prescriptions as of 03/11/2015  Medication Sig  . aspirin 81 MG tablet Take 81 mg by mouth daily.  . Calcium-Magnesium-Vitamin D (CALCIUM MAGNESIUM PO) Take by mouth.  . Cholecalciferol (VITAMIN D-3 PO) Take by mouth. 1000 units  . GINSENG [place]ORE[place] PO Take by mouth.  . Multiple Vitamin (MULTIVITAMIN) tablet Take 1 tablet by mouth daily.  . fluocinonide-emollient (LIDEX-E) 0.05 % cream Apply topically 2 (two) times daily. To rash  Not on face . (Patient not taking: Reported on 03/11/2015)   No facility-administered encounter medications on file as of 03/11/2015.    EXAM:  BP 124/80 mmHg  Temp(Src) 98.2 F (36.8 C) (Oral)  Ht 5\' 2"  (1.575 m)  Wt 201 lb 11.2 oz (91.491 kg)  BMI 36.88 kg/m2  Body mass index is 36.88 kg/(m^2).  Physical Exam: Vital signs reviewed RCV:ELFY is a well-developed well-nourished alert cooperative   who appears stated age in no acute distress.  HEENT: normocephalic atraumatic , Eyes: PERRL EOM's full, conjunctiva clear,  Glasses Nares: paten,t no deformity discharge or tenderness., Ears: no deformity EAC's clear TMs with normal landmarks. Mouth: clear OP, no lesions, edema.  Moist mucous membranes. Dentition in adequate repair. NECK: supple without masses, thyromegaly or bruits. CHEST/PULM:  Clear to auscultation and percussion breath sounds equal no wheeze , rales or rhonchi. No chest wall deformities or tenderness. CV: PMI is nondisplaced, S1 S2 no gallops, murmurs, rubs. Peripheral pulses are full without delay.No JVD . Breast: normal by inspection . No dimpling, discharge, masses, tenderness or discharge  . ABDOMEN: Bowel sounds normal nontender  No guard or rebound, no hepato splenomegal no CVA tenderness.  No hernia. Extremtities:  No clubbing cyanosis or edema, no acute joint swelling or redness no focal atrophy  superfical vv noted legs 1+ edema  NEURO:  Oriented x3, cranial nerves 3-12 appear to be intact, no obvious focal weakness,gait within normal limits no abnormal reflexes or asymmetrical SKIN: No acute rashes normal turgor, color, no bruising or petechiae. PSYCH: Oriented, good eye contact, no obvious depression anxiety, cognition and judgment appear normal. LN: no cervical axillary inguinal adenopathy No noted deficits in memory, attention, and speech.    BP Readings from Last  3 Encounters:  03/11/15 124/80  01/29/15 124/80  05/14/13 121/84   Wt Readings from Last 3 Encounters:  03/11/15 201 lb 11.2 oz (91.491 kg)  01/29/15 204 lb (92.534 kg)  05/14/13 200 lb (90.719 kg)     ASSESSMENT AND PLAN:  Discussed the following assessment and plan:  Visit for preventive health examination - prevnar today pap next year and if ok no more - Plan: Basic metabolic panel, CBC with Differential/Platelet, Hepatic function panel, Lipid panel, TSH, POCT urinalysis dipstick  Welcome to Medicare preventive visit - Plan: Basic metabolic panel, CBC with Differential/Platelet, Hepatic function panel, Lipid panel, TSH, POCT urinalysis dipstick  Estrogen deficiency - get dexa - Plan: DG Bone Density, Basic metabolic panel, CBC with Differential/Platelet, Hepatic function panel, Lipid panel, TSH, POCT urinalysis dipstick  Recent bereavement - managing seems normal but very stressful situation disc counseling  - Plan: Basic metabolic panel, CBC with Differential/Platelet, Hepatic function panel, Lipid panel, TSH, POCT urinalysis dipstick  New daily persistent headache - disc keep appt neuro   Need for vaccination with 13-polyvalent pneumococcal conjugate vaccine - Plan: Pneumococcal conjugate  vaccine 13-valent  Family history of melanoma - sis Counseled regarding healthy nutrition, exercise, sleep, injury prevention, calcium vit d and healthy weight . Additional counseling related to  Above situation  Advise PAP next year  Patient Care Team: [name], MD as PCP - General Inda Castle, MD as Attending Physician (Gastroenterology)  Patient Instructions   Keep  Headache appt .  See counselor about multiple losses  We can add medication if  Appropriate .  pGet dexa scan.   Make appt . prevnar 13  .Marland KitchenMarland KitchenToday   pneumovax in 1 year .Marland Kitchen Will notify you  of labs when available. Advise ROV  If needed depending on labs and  Results and  How you are doing .      Healthy lifestyle includes : At least 150 minutes of exercise weeks  , weight at healthy levels, which is usually   BMI 19-25. Avoid trans fats and processed foods;  Increase fresh fruits and veges to 5 servings per day. And avoid sweet beverages including tea and juice. Mediterranean diet with olive oil and nuts have been noted to be heart and brain healthy . Avoid tobacco products . Limit  alcohol to  7 per week for women and 14 servings for men.  Get adequate sleep . Wear seat belts . Don't text and drive .         Why follow it? Research shows. . Those who follow the Mediterranean diet have a reduced risk of heart disease  . The diet is associated with a reduced incidence of Parkinson's and Alzheimer's diseases . People following the diet may have longer life expectancies and lower rates of chronic diseases  . The Dietary Guidelines for Americans recommends the Mediterranean diet as an eating plan to promote health and prevent disease  What Is the Mediterranean Diet?  . Healthy eating plan based on typical foods and recipes of Mediterranean-style cooking . The diet is primarily a plant based diet; these foods should make up a majority of meals   Starches - Plant based foods should make up a majority of  meals - They are an important sources of vitamins, minerals, energy, antioxidants, and fiber - Choose whole grains, foods high in fiber and minimally processed items  - Typical grain sources include wheat, oats, barley, corn, brown rice, bulgar, farro, millet, polenta, couscous  - Various  types of beans include chickpeas, lentils, fava beans, black beans, white beans   Fruits  Veggies - Large quantities of antioxidant rich fruits & veggies; 6 or more servings  - Vegetables can be eaten raw or lightly drizzled with oil and cooked  - Vegetables common to the traditional Mediterranean Diet include: artichokes, arugula, beets, broccoli, brussel sprouts, cabbage, carrots, celery, collard greens, cucumbers, eggplant, kale, leeks, lemons, lettuce, mushrooms, okra, onions, peas, peppers, potatoes, pumpkin, radishes, rutabaga, shallots, spinach, sweet potatoes, turnips, zucchini - Fruits common to the Mediterranean Diet include: apples, apricots, avocados, cherries, clementines, dates, figs, grapefruits, grapes, melons, nectarines, oranges, peaches, pears, pomegranates, strawberries, tangerines  Fats - Replace butter and margarine with healthy oils, such as olive oil, canola oil, and tahini  - Limit nuts to no more than a handful a day  - Nuts include walnuts, almonds, pecans, pistachios, pine nuts  - Limit or avoid candied, honey roasted or heavily salted nuts - Olives are central to the [employer] - can be eaten whole or used in a variety of dishes   Meats Protein - Limiting red meat: no more than a few times a month - When eating red meat: choose lean cuts and keep the portion to the size of deck of cards - Eggs: approx. 0 to 4 times a week  - Fish and lean poultry: at least 2 a week  - Healthy protein sources include, chicken, [place], lean beef, lamb - Increase intake of seafood such as tuna, salmon, trout, mackerel, shrimp, scallops - Avoid or limit high fat processed meats such as sausage  and bacon  Dairy - Include moderate amounts of low fat dairy products  - Focus on healthy dairy such as fat free yogurt, skim milk, low or reduced fat cheese - Limit dairy products higher in fat such as whole or 2% milk, cheese, ice cream  Alcohol - Moderate amounts of red wine is ok  - No more than 5 oz daily for women (all ages) and men older than age 31  - No more than 10 oz of wine daily for men younger than 48  Other - Limit sweets and other desserts  - Use herbs and spices instead of salt to flavor foods  - Herbs and spices common to the traditional Mediterranean Diet include: basil, bay leaves, chives, cloves, cumin, fennel, garlic, lavender, marjoram, mint, oregano, parsley, pepper, rosemary, sage, savory, sumac, tarragon, thyme   It's not just a diet, it's a lifestyle:  . The Mediterranean diet includes lifestyle factors typical of those in the region  . Foods, drinks and meals are best eaten with others and savored . Daily physical activity is important for overall good health . This could be strenuous exercise like running and aerobics . This could also be more leisurely activities such as walking, housework, yard-work, or taking the stairs . Moderation is the key; a balanced and healthy diet accommodates most foods and drinks . Consider portion sizes and frequency of consumption of certain foods   Meal Ideas & Options:  . Breakfast:  o Whole wheat toast or whole wheat English muffins with peanut butter & hard boiled egg o Steel cut oats topped with apples & cinnamon and skim milk  o Fresh fruit: banana, strawberries, melon, berries, peaches  o Smoothies: strawberries, bananas, greek yogurt, peanut butter o Low fat greek yogurt with blueberries and granola  o Egg white omelet with spinach and mushrooms o Breakfast couscous: whole wheat couscous, apricots, skim milk, cranberries  .  Sandwiches:  o Hummus and grilled vegetables (peppers, zucchini, squash) on whole wheat bread     o Grilled chicken on whole wheat pita with lettuce, tomatoes, cucumbers or tzatziki  o Tuna salad on whole wheat bread: tuna salad made with greek yogurt, olives, red peppers, capers, green onions o Garlic rosemary lamb pita: lamb sauted with garlic, rosemary, salt & pepper; add lettuce, cucumber, greek yogurt to pita - flavor with lemon juice and black pepper  . Seafood:  o Mediterranean grilled salmon, seasoned with garlic, basil, parsley, lemon juice and black pepper o Shrimp, lemon, and spinach whole-grain pasta salad made with low fat greek yogurt  o Seared scallops with lemon orzo  o Seared tuna steaks seasoned salt, pepper, coriander topped with tomato mixture of olives, tomatoes, olive oil, minced garlic, parsley, green onions and cappers  . Meats:  o Herbed greek chicken salad with kalamata olives, cucumber, feta  o Red bell peppers stuffed with spinach, bulgur, lean ground beef (or lentils) & topped with feta   o Kebabs: skewers of chicken, tomatoes, onions, zucchini, squash  o [place] burgers: made with red onions, mint, dill, lemon juice, feta cheese topped with roasted red peppers . Vegetarian o Cucumber salad: cucumbers, artichoke hearts, celery, red onion, feta cheese, tossed in olive oil & lemon juice  o Hummus and whole grain pita points with a greek salad (lettuce, tomato, feta, olives, cucumbers, red onion) o Lentil soup with celery, carrots made with vegetable broth, garlic, salt and pepper  o Tabouli salad: parsley, bulgur, mint, scallions, cucumbers, tomato, radishes, lemon juice, olive oil, salt and pepper.         Standley Brooking. Panosh M.D.

## 2015-03-19 ENCOUNTER — Encounter: Payer: Self-pay | Admitting: Neurology

## 2015-03-19 ENCOUNTER — Ambulatory Visit (INDEPENDENT_AMBULATORY_CARE_PROVIDER_SITE_OTHER): Payer: Commercial Managed Care - HMO | Admitting: Neurology

## 2015-03-19 VITALS — BP 130/84 | HR 68 | Resp 18 | Ht 62.0 in | Wt 200.6 lb

## 2015-03-19 DIAGNOSIS — M542 Cervicalgia: Secondary | ICD-10-CM | POA: Diagnosis not present

## 2015-03-19 DIAGNOSIS — R51 Headache: Secondary | ICD-10-CM

## 2015-03-19 DIAGNOSIS — R519 Headache, unspecified: Secondary | ICD-10-CM

## 2015-03-19 DIAGNOSIS — G4486 Cervicogenic headache: Secondary | ICD-10-CM

## 2015-03-19 MED ORDER — GABAPENTIN 100 MG PO CAPS: 300.0000 mg | ORAL_CAPSULE | Freq: Every day | ORAL | Status: DC

## 2015-03-19 NOTE — Progress Notes (Signed)
NEUROLOGY CONSULTATION NOTE  Ashley Moore MRN: [id number] DOB: 1950/10/24  Referring provider: Dr. Regis Bill Primary care provider: Dr. Regis Bill  Reason for consult:  Headache  HISTORY OF PRESENT ILLNESS: Ashley Moore is a 64 year old right-handed woman with degenerative arthritis of the cervical spine who presents for headache.  Records and recent labs reviewed.  On 10/28/14, her husband fell.  When she tried to get him up, she developed this severe pain in the back of her head.  She didn't notice pulling her neck.  She has had a constant occipital headache since then.  It is located on the left side.  When she moves her neck or head slightly in any direction, she feels a burning shooting pain up to the front of the head and down the left side of her neck to the shoulder.  She denies any numbness or tingling in the back of the head or in the extremities.  She denies visual symptoms, nausea, photophobia, phonophobia or dizziness.  Besides head movement, stress also exacerbates it.  Applying pressure to the area seems to help.  She has suffered the loss of both her husband and sister in the past several weeks.  More recently, she notes cracking in her neck when she moves.  Since the occurrence, she has been taking Tylenol daily.  She also tried heating pad and ice, which are not too effective.  Massage only briefly helps for about 10 minutes.  She did not go to the physical therapy.  She has not had imaging.  PAST MEDICAL HISTORY: Past Medical History  Diagnosis Date  . Bowen's disease     excised 1990  . Hx of varicella   . Headache     PAST SURGICAL HISTORY: Past Surgical History  Procedure Laterality Date  . Bunionectomy Right   . Skin cancer excision      bowens disease    MEDICATIONS: Current Outpatient Prescriptions on File Prior to Visit  Medication Sig Dispense Refill  . aspirin 81 MG tablet Take 81 mg by mouth daily.    . Calcium-Magnesium-Vitamin D (CALCIUM  MAGNESIUM PO) Take by mouth.    . Cholecalciferol (VITAMIN D-3 PO) Take by mouth. 1000 units    . fluocinonide-emollient (LIDEX-E) 0.05 % cream Apply topically 2 (two) times daily. To rash  Not on face . 15 g 1  . GINSENG KOREAN PO Take by mouth.    . Multiple Vitamin (MULTIVITAMIN) tablet Take 1 tablet by mouth daily.     No current facility-administered medications on file prior to visit.    ALLERGIES: Allergies  Allergen Reactions  . Penicillins Rash    FAMILY HISTORY: Family History  Problem Relation Age of Onset  . Breast cancer      mom and MGM and sister   . Pancreatitis Father   . Cancer Mother     lung  . Cancer Sister     breast/melanoma  . Cancer Maternal Grandmother     breast   . Cancer Maternal Aunt     breast    SOCIAL HISTORY: History   Social History  . Marital Status: Married    Spouse Name: N/A  . Number of Children: N/A  . Years of Education: N/A   Occupational History  . Not on file.   Social History Main Topics  . Smoking status: Never Smoker   . Smokeless tobacco: Not on file  . Alcohol Use: 0.0 oz/week    0 Standard drinks or equivalent  per week  . Drug Use: Yes    Special: Marijuana  . Sexual Activity: No   Other Topics Concern  . Not on file   Social History Narrative   H H  of 1      5 pets.   She is a former smoker   Retired Engineer, mining fine [employer]   Husband smokes he has active heart disease and vascular disease   etoh   Red wine  1 per night.    Tea green tea and earl gray    Moved from [place] to [place] area in 03-Jan-1984   1 pregnancy   Husband died spring  2016   Sister died 2015-04-04                 REVIEW OF SYSTEMS: Constitutional: No fevers, chills, or sweats, no generalized fatigue, change in appetite Eyes: No visual changes, double vision, eye pain Ear, nose and throat: No hearing loss, ear pain, nasal congestion, sore throat Cardiovascular: No chest pain, palpitations Respiratory:  No  shortness of breath at rest or with exertion, wheezes GastrointestinaI: No nausea, vomiting, diarrhea, abdominal pain, fecal incontinence Genitourinary:  No dysuria, urinary retention or frequency Musculoskeletal:  No neck pain, back pain Integumentary: No rash, pruritus, skin lesions Neurological: as above Psychiatric: No depression, insomnia, anxiety Endocrine: No palpitations, fatigue, diaphoresis, mood swings, change in appetite, change in weight, increased thirst Hematologic/Lymphatic:  No anemia, purpura, petechiae. Allergic/Immunologic: no itchy/runny eyes, nasal congestion, recent allergic reactions, rashes  PHYSICAL EXAM: Filed Vitals:   2015-04-04 1045  BP: 130/84  Pulse: 68  Resp: 18   General: No acute distress Head:  Normocephalic/atraumatic Eyes:  fundi unremarkable, without vessel changes, exudates, hemorrhages or papilledema. Neck: supple, no paraspinal tenderness, full range of motion Back: No paraspinal tenderness Heart: regular rate and rhythm Lungs: Clear to auscultation bilaterally. Vascular: No carotid bruits. Neurological Exam: Mental status: alert and oriented to person, place, and time, recent and remote memory intact, fund of knowledge intact, attention and concentration intact, speech fluent and not dysarthric, language intact. Cranial nerves: CN I: not tested CN II: pupils equal, round and reactive to light, visual fields intact, fundi unremarkable, without vessel changes, exudates, hemorrhages or papilledema. CN III, IV, VI:  full range of motion, no nystagmus, no ptosis CN V: facial sensation intact CN VII: upper and lower face symmetric CN VIII: hearing intact CN IX, X: gag intact, uvula midline CN XI: sternocleidomastoid and trapezius muscles intact CN XII: tongue midline Bulk & Tone: normal, no fasciculations. Motor:  5/5 throughout Sensation:  Temperature and vibration intact Deep Tendon Reflexes:  2+ throughout, toes downgoing Finger to nose  testing:  No dysmetria Heel to shin:  No dysmetria Gait:  Ambulates with slight limp on the right due to knee and foot problem. Romberg negative.  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  Will start gabapentin 300mg  at bedtime 2.  She typically does not prefer medications.  I think she would benefit from OMM by Dr. Tamala Julian, so we will refer her to him.  We could order MRI of the cervical spine, but we will let Dr. Tamala Julian first look at her. 3.  Follow up in 2 months.  Thank you for allowing me to take part in the care of this patient.  Metta Clines, DO  CC:  Shanon Ace, MD

## 2015-03-19 NOTE — Patient Instructions (Signed)
I think the head pain is coming for the neck. 1.  Start gabapentin 100mg  capsules.  Take 3 capsules at bedtime.  If you are too drowsy the next morning, you can reduce dose to either 1 or 2 capsules.  Call with update. 2.  We will refer you to Dr. Hulan Saas of Sports Medicine for treatment 3.  Follow up with me in 2 months.

## 2015-03-26 ENCOUNTER — Ambulatory Visit (INDEPENDENT_AMBULATORY_CARE_PROVIDER_SITE_OTHER)
Admission: RE | Admit: 2015-03-26 | Discharge: 2015-03-26 | Disposition: A | Discharge status: Home / Self Care | Payer: Commercial Managed Care - HMO | Source: Ambulatory Visit | Attending: Family Medicine | Admitting: Family Medicine

## 2015-03-26 ENCOUNTER — Ambulatory Visit (INDEPENDENT_AMBULATORY_CARE_PROVIDER_SITE_OTHER): Payer: Commercial Managed Care - HMO | Admitting: Family Medicine

## 2015-03-26 ENCOUNTER — Encounter: Payer: Self-pay | Admitting: Family Medicine

## 2015-03-26 VITALS — BP 94/70 | HR 83 | Ht 62.0 in | Wt 203.0 lb

## 2015-03-26 DIAGNOSIS — M5032 Other cervical disc degeneration, mid-cervical region: Secondary | ICD-10-CM | POA: Diagnosis not present

## 2015-03-26 DIAGNOSIS — M542 Cervicalgia: Secondary | ICD-10-CM | POA: Diagnosis not present

## 2015-03-26 MED ORDER — BACLOFEN 10 MG PO TABS: 5.0000 mg | ORAL_TABLET | Freq: Three times a day (TID) | ORAL | Status: DC

## 2015-03-26 NOTE — Patient Instructions (Addendum)
Good to see you.  Ice 20 minutes 2 times daily. Usually after activity and before bed. Heat before activity.  Exercises daily Xrays downstairs today.  Duexis 3 times a day for 6 days Baclofen at night if you want to try it instead of gabapentin.  See me again in 11-14 days

## 2015-03-26 NOTE — Assessment & Plan Note (Signed)
Seems to be muscular skeletal in nature. Patient did have trigger point injections and did have some mild to moderate improvement initially. Patient will stop the gabapentin with her not noticing any significant improvement. Patient was given a muscle relaxer. Patient will try topical anti-inflammatory's. We will get x-rays rule out any bony abnormality secondary to the loss of motion but I think this is highly unlikely. I do believe the patient will do well with conservative therapy. Patient come back and see me again in 2 weeks for further evaluation and treatment.

## 2015-03-26 NOTE — Progress Notes (Signed)
Pre visit review using our clinic review tool, if applicable. No additional management support is needed unless otherwise documented below in the visit note. 

## 2015-03-26 NOTE — Progress Notes (Signed)
[facility] [place], [place] [address] Phone: [phone] Subjective:    I'm seeing this patient by the request  of:  Lottie Dawson, Moore  Ashley Moore  CC: Neck pain  Ashley Moore [name] is a 64 y.o. female coming in with complaint of neck pain. Patient was sent to neurology for headaches as well as neck pain. There is concern the patient's neck pain and headaches are combined second due to a cervicogenic headache. Patient's initial injury occurred when she was trying to get her husband up when he fell. Patient had severe pain immediately on the posterior aspect of the head. Patient states that then she had a constant occipital headache mostly on the left side of the neck. Seems to go down and radiate towards her shoulder. Denies any tingling in the back or any weakness of the upper extremity is. Denies any photophobia or dizziness. Patient states that overhead movement though seems to make it worse as well as stress. Applying heat or ice sometimes seems to be beneficial. Patient though has had significant stressors with losing her husband and sister in the last several weeks. Patient is tried over-the-counter remedies including Tylenol and continues the other cryotherapy with minimal benefit. She has tried massage which only last minutes. Patient was referred to physical therapy but did not go. Patient has pins prescribed gabapentin at night. Patient was referred here for further evaluation. Rates the severity of pain a 6 out of 10.  Past Medical History  Diagnosis Date  . Bowen's disease     excised 1990  . Hx of varicella   . Headache    Past Surgical History  Procedure Laterality Date  . Bunionectomy Right   . Skin cancer excision      bowens disease   History  Substance Use Topics  . Smoking status: Never Smoker   . Smokeless tobacco: Not on file  . Alcohol Use: 0.0 oz/week    0 Standard drinks or equivalent per week    Family History  Problem Relation Age of Onset  . Breast cancer      mom and MGM and sister   . Pancreatitis Father   . Cancer Mother     lung  . Cancer Sister     breast/melanoma  . Cancer Maternal Grandmother     breast   . Cancer Maternal Aunt     breast   Allergies  Allergen Reactions  . Penicillins Rash        Past medical history, social, surgical and family history all reviewed in electronic medical record.   Review of Systems: No headache, visual changes, nausea, vomiting, diarrhea, constipation, dizziness, abdominal pain, skin rash, fevers, chills, night sweats, weight loss, swollen lymph nodes, body aches, joint swelling, muscle aches, chest pain, shortness of breath, mood changes.   Objective Blood pressure 94/70, pulse 83, height 5\' 2"  (1.575 m), weight 203 lb (92.08 kg), SpO2 97 %.  General: No apparent distress alert and oriented x3 mood and affect normal, dressed appropriately.  HEENT: Pupils equal, extraocular movements intact  Respiratory: Patient's speak in full sentences and does not appear short of breath  Cardiovascular: No lower extremity edema, non tender, no erythema  Skin: Warm dry intact with no signs of infection or rash on extremities or on axial skeleton.  Abdomen: Soft nontender  Neuro: Cranial nerves II through XII are intact, neurovascularly intact in all extremities with 2+ DTRs and 2+ pulses.  Lymph:  No lymphadenopathy of posterior or anterior cervical chain or axillae bilaterally.  Gait normal with good balance and coordination.  MSK:  Non tender with full range of motion and good stability and symmetric strength and tone of shoulders, elbows, wrist, hip, knee and ankles bilaterally.  Neck: Loss of lordosis No palpable stepoffs. Negative Spurling's maneuver. Negative decrease in range of motion lacking the last 20 of extension as well as any type of rotation bilaterally. I think this is secondary more to pain than true in range of  motion. Grip strength and sensation normal in bilateral hands Strength good C4 to T1 distribution No sensory change to C4 to T1 Negative Hoffman sign bilaterally Reflexes normal Trigger points noted in the left trapezius muscle   Procedure note Verbal consent patient was prepped with alcohol swabs and with a 25-gauge 1 inch and a was injected in total of 4 to points in the left trapezius muscle. This totaled re-cc of 0.5% Marcaine and 1 mL of Kenalog 40 mg/dL. Minimal blood loss. Post injection instructions given.  [id number]; 15 minutes spent for Therapeutic exercises as stated in above notes.  This included exercises focusing on stretching, strengthening, with significant focus on eccentric aspects.  Patient given range of motion exercises focusing on flexion-extension as well as sidebending and rotational components. No strengthening exercises but postural control exercises given. Proper technique shown and discussed handout in great detail with ATC.  All questions were discussed and answered.       Impression and Recommendations:     This case required medical decision making of moderate complexity.

## 2015-04-01 ENCOUNTER — Telehealth: Payer: Self-pay | Admitting: Family Medicine

## 2015-04-01 ENCOUNTER — Other Ambulatory Visit: Payer: Self-pay

## 2015-04-01 MED ORDER — IBUPROFEN-FAMOTIDINE 800-26.6 MG PO TABS: ORAL_TABLET | ORAL | Status: DC

## 2015-04-01 NOTE — Telephone Encounter (Signed)
Left message regarding prescription - it was sent into [facility] in [place] for her.

## 2015-04-01 NOTE — Telephone Encounter (Signed)
Patient would like to know what she needs to do in regards to duexis.  States she has finished up today and it has helped.  Patient does not believe she could move her neck without it. Patient states she could possibly do a less of a dose.  [facility] uses [facility] on [address].  Please advise patient.

## 2015-04-08 ENCOUNTER — Ambulatory Visit (INDEPENDENT_AMBULATORY_CARE_PROVIDER_SITE_OTHER): Payer: Commercial Managed Care - HMO | Admitting: Family Medicine

## 2015-04-08 ENCOUNTER — Encounter: Payer: Self-pay | Admitting: Family Medicine

## 2015-04-08 VITALS — BP 132/80 | HR 77 | Wt 199.0 lb

## 2015-04-08 DIAGNOSIS — M5030 Other cervical disc degeneration, unspecified cervical region: Secondary | ICD-10-CM | POA: Diagnosis not present

## 2015-04-08 DIAGNOSIS — M542 Cervicalgia: Secondary | ICD-10-CM | POA: Diagnosis not present

## 2015-04-08 MED ORDER — BACLOFEN 10 MG PO TABS: 10.0000 mg | ORAL_TABLET | Freq: Three times a day (TID) | ORAL | Status: DC

## 2015-04-08 MED ORDER — MELOXICAM 15 MG PO TABS: 15.0000 mg | ORAL_TABLET | Freq: Every day | ORAL | Status: DC

## 2015-04-08 MED ORDER — GABAPENTIN 100 MG PO CAPS: 300.0000 mg | ORAL_CAPSULE | Freq: Every day | ORAL | Status: DC

## 2015-04-08 NOTE — Progress Notes (Signed)
[facility] [place], [place] [address] Phone: [phone] Subjective:    [place]: Neck pain follow-up  [id number] [place] Moore is a 64 y.o. female coming in with complaint of neck pain. Patient does have moderate osteophytic changes of the neck at multiple levels. Patient's was treated for more mature point injections and was given home exercises. Patient was to do an icing protocol. Patient was given a muscle relaxer. Patient states she is approximately 25% better. Patient states that she has started to try to increase her range of motion but continues to have some difficulty. Patient has been seen a massage therapist with some mild benefit. Patient forgot to pick up the gabapentin at this time.  Patient did have x-rays after last exam that does show that patient has moderate to severe osteophytic changes in multiple levels of the cervical spine.  Past Medical History  Diagnosis Date  . Bowen's disease     excised 1990  . Hx of varicella   . Headache    Past Surgical History  Procedure Laterality Date  . Bunionectomy Right   . Skin cancer excision      bowens disease   History  Substance Use Topics  . Smoking status: Never Smoker   . Smokeless tobacco: Not on file  . Alcohol Use: 0.0 oz/week    0 Standard drinks or equivalent per week   Family History  Problem Relation Age of Onset  . Breast cancer      mom and MGM and sister   . Pancreatitis Father   . Cancer Mother     lung  . Cancer Sister     breast/melanoma  . Cancer Maternal Grandmother     breast   . Cancer Maternal Aunt     breast   Allergies  Allergen Reactions  . Penicillins Rash        Past medical history, social, surgical and family history all reviewed in electronic medical record.   Review of Systems: No headache, visual changes, nausea, vomiting, diarrhea, constipation, dizziness, abdominal pain, skin rash, fevers, chills, night sweats, weight  loss, swollen lymph nodes, body aches, joint swelling, muscle aches, chest pain, shortness of breath, mood changes.   Objective Blood pressure 132/80, pulse 77, weight 199 lb (90.266 kg), SpO2 97 %.  General: No apparent distress alert and oriented x3 mood and affect normal, dressed appropriately.  HEENT: Pupils equal, extraocular movements intact  Respiratory: Patient's speak in full sentences and does not appear short of breath  Cardiovascular: No lower extremity edema, non tender, no erythema  Skin: Warm dry intact with no signs of infection or rash on extremities or on axial skeleton.  Abdomen: Soft nontender  Neuro: Cranial nerves II through XII are intact, neurovascularly intact in all extremities with 2+ DTRs and 2+ pulses.  Lymph: No lymphadenopathy of posterior or anterior cervical chain or axillae bilaterally.  Gait normal with good balance and coordination.  MSK:  Non tender with full range of motion and good stability and symmetric strength and tone of shoulders, elbows, wrist, hip, knee and ankles bilaterally.  Neck: Loss of lordosis No palpable stepoffs. Negative Spurling's maneuver. significant decrease in range of motion lacking the last 20 of extension as well as any type of rotation bilaterally.minimal improvement from previous exampatient barely can rotate to the left past midline. Grip strength and sensation normal in bilateral hands Strength good C4 to T1 distribution No sensory change to C4 to T1  Negative Hoffman sign bilaterally Reflexes normal          Impression and Recommendations:     This case required medical decision making of moderate complexity.

## 2015-04-08 NOTE — Patient Instructions (Addendum)
Good to see you ICe is your friend Gabapentin 100mg  at night for first 3 nights then 200mg  for 3 nights then 300mg  at night thereafter Tylenol 650mg  3 times a day  Muscle relaxer as you need it Continue with message and can consider chiropractor but be careful.  See me again in 4 weeks.

## 2015-04-08 NOTE — Assessment & Plan Note (Signed)
Significant degenerative changes of patient's neck at this time. We discussed icing regimen and home exercises. We discussed formal physical therapy which patient was referred to. We discussed anti-inflammatory the patient was given a prescription. Patient also is going to start gabapentin which I think will be beneficial.discussed with patient about the potential limitations imaging the patient is having no radicular symptoms at this time. Patient does she will call back sooner. Patient discussed the possibility of manipulation which I declined to do today but we will discuss at follow-up. If so I would consider working on patient's thoracic spine but avoid significant advanced techniques on patient's cervical spine.  Spent  25 minutes with patient face-to-face and had greater than 50% of counseling including as described above in assessment and plan.

## 2015-04-12 ENCOUNTER — Other Ambulatory Visit: Payer: Self-pay | Admitting: Family Medicine

## 2015-04-13 DIAGNOSIS — M542 Cervicalgia: Secondary | ICD-10-CM | POA: Diagnosis not present

## 2015-04-13 NOTE — Telephone Encounter (Signed)
rx sent into correct pharmacy 

## 2015-04-14 ENCOUNTER — Telehealth: Payer: Self-pay | Admitting: Internal Medicine

## 2015-04-14 NOTE — Telephone Encounter (Signed)
Pt is requesting the same cream md gave her in 2014 for the rash on her face and arms. [facility] [address]

## 2015-04-14 NOTE — Telephone Encounter (Signed)
Cannot use lidex on the face   Too strong and can do permanent skin changes .  Blisters can be infection or  dermatitis     Needs OV sounds new

## 2015-04-14 NOTE — Telephone Encounter (Signed)
Spoke to the pt.  Does not itch.  Skin is red and raised.  Looks like blisters.  On face and arms. Looks like it is spreading to her wrists.  No fever.  No pain.  I looked in the history and looks like Lidex cream was prescribed in 2014.  Pt stated that it did sound familiar.  Please advise.  Thanks!

## 2015-04-15 ENCOUNTER — Ambulatory Visit (INDEPENDENT_AMBULATORY_CARE_PROVIDER_SITE_OTHER): Payer: Commercial Managed Care - HMO | Admitting: Psychology

## 2015-04-15 DIAGNOSIS — F4323 Adjustment disorder with mixed anxiety and depressed mood: Secondary | ICD-10-CM

## 2015-04-15 NOTE — Telephone Encounter (Signed)
Spoke to the pt.  Informed her that [facility] advises an appt.  She has made an appt for 04/16/15 @ 10:45 AM.

## 2015-04-16 ENCOUNTER — Encounter: Payer: Commercial Managed Care - HMO | Admitting: Internal Medicine

## 2015-04-16 DIAGNOSIS — Z0289 Encounter for other administrative examinations: Secondary | ICD-10-CM

## 2015-04-16 DIAGNOSIS — M542 Cervicalgia: Secondary | ICD-10-CM | POA: Diagnosis not present

## 2015-04-16 NOTE — Progress Notes (Signed)
Document opened and reviewed for acutevisit . No showed .

## 2015-04-21 DIAGNOSIS — M542 Cervicalgia: Secondary | ICD-10-CM | POA: Diagnosis not present

## 2015-04-23 DIAGNOSIS — M542 Cervicalgia: Secondary | ICD-10-CM | POA: Diagnosis not present

## 2015-04-28 DIAGNOSIS — M542 Cervicalgia: Secondary | ICD-10-CM | POA: Diagnosis not present

## 2015-04-29 ENCOUNTER — Ambulatory Visit (INDEPENDENT_AMBULATORY_CARE_PROVIDER_SITE_OTHER): Payer: Medicare HMO | Admitting: Psychology

## 2015-04-29 DIAGNOSIS — F4323 Adjustment disorder with mixed anxiety and depressed mood: Secondary | ICD-10-CM | POA: Diagnosis not present

## 2015-05-01 DIAGNOSIS — M542 Cervicalgia: Secondary | ICD-10-CM | POA: Diagnosis not present

## 2015-05-05 ENCOUNTER — Emergency Department (HOSPITAL_COMMUNITY): Payer: Commercial Managed Care - HMO

## 2015-05-05 ENCOUNTER — Encounter (HOSPITAL_COMMUNITY): Payer: Self-pay

## 2015-05-05 ENCOUNTER — Emergency Department (HOSPITAL_COMMUNITY)
Admission: EM | Admit: 2015-05-05 | Discharge: 2015-05-05 | Disposition: A | Discharge status: Home / Self Care | Payer: Commercial Managed Care - HMO | Attending: Emergency Medicine | Admitting: Emergency Medicine

## 2015-05-05 DIAGNOSIS — Z7982 Long term (current) use of aspirin: Secondary | ICD-10-CM | POA: Diagnosis not present

## 2015-05-05 DIAGNOSIS — Z86008 Personal history of in-situ neoplasm of other site: Secondary | ICD-10-CM | POA: Diagnosis not present

## 2015-05-05 DIAGNOSIS — Z791 Long term (current) use of non-steroidal anti-inflammatories (NSAID): Secondary | ICD-10-CM | POA: Insufficient documentation

## 2015-05-05 DIAGNOSIS — R112 Nausea with vomiting, unspecified: Secondary | ICD-10-CM | POA: Diagnosis not present

## 2015-05-05 DIAGNOSIS — K5732 Diverticulitis of large intestine without perforation or abscess without bleeding: Secondary | ICD-10-CM | POA: Diagnosis not present

## 2015-05-05 DIAGNOSIS — Z8619 Personal history of other infectious and parasitic diseases: Secondary | ICD-10-CM | POA: Diagnosis not present

## 2015-05-05 DIAGNOSIS — K5792 Diverticulitis of intestine, part unspecified, without perforation or abscess without bleeding: Secondary | ICD-10-CM | POA: Diagnosis not present

## 2015-05-05 DIAGNOSIS — Z880 Allergy status to penicillin: Secondary | ICD-10-CM | POA: Diagnosis not present

## 2015-05-05 DIAGNOSIS — N3090 Cystitis, unspecified without hematuria: Secondary | ICD-10-CM | POA: Diagnosis present

## 2015-05-05 DIAGNOSIS — Z79899 Other long term (current) drug therapy: Secondary | ICD-10-CM | POA: Diagnosis not present

## 2015-05-05 DIAGNOSIS — K2970 Gastritis, unspecified, without bleeding: Secondary | ICD-10-CM | POA: Diagnosis not present

## 2015-05-05 DIAGNOSIS — R1030 Lower abdominal pain, unspecified: Secondary | ICD-10-CM | POA: Diagnosis present

## 2015-05-05 DIAGNOSIS — K838 Other specified diseases of biliary tract: Secondary | ICD-10-CM | POA: Diagnosis not present

## 2015-05-05 LAB — COMPREHENSIVE METABOLIC PANEL
ALT: 22 U/L (ref 14–54)
AST: 31 U/L (ref 15–41)
Albumin: 3.8 g/dL (ref 3.5–5.0)
Alkaline Phosphatase: 65 U/L (ref 38–126)
Anion gap: 10 (ref 5–15)
BUN: 17 mg/dL (ref 6–20)
CHLORIDE: 107 mmol/L (ref 101–111)
CO2: 20 mmol/L — ABNORMAL LOW (ref 22–32)
Calcium: 8.9 mg/dL (ref 8.9–10.3)
Creatinine, Ser: 0.63 mg/dL (ref 0.44–1.00)
GFR calc Af Amer: >60 mL/min (ref >60)
Glucose, Bld: 130 mg/dL — ABNORMAL HIGH (ref 65–99)
POTASSIUM: 3.3 mmol/L — AB (ref 3.5–5.1)
SODIUM: 137 mmol/L (ref 135–145)
Total Bilirubin: 0.7 mg/dL (ref 0.3–1.2)
Total Protein: 6.8 g/dL (ref 6.5–8.1)

## 2015-05-05 LAB — URINALYSIS, ROUTINE W REFLEX MICROSCOPIC
Bilirubin Urine: NEGATIVE
Glucose, UA: NEGATIVE mg/dL
HGB URINE DIPSTICK: NEGATIVE
Ketones, ur: >80 mg/dL — AB
Nitrite: NEGATIVE
PROTEIN: NEGATIVE mg/dL
Specific Gravity, Urine: 1.019 (ref 1.005–1.030)
UROBILINOGEN UA: 0.2 mg/dL (ref 0.0–1.0)
pH: 6 (ref 5.0–8.0)

## 2015-05-05 LAB — CBC
HEMATOCRIT: 38.3 % (ref 36.0–46.0)
Hemoglobin: 12.6 g/dL (ref 12.0–15.0)
MCH: 28.3 pg (ref 26.0–34.0)
MCHC: 32.9 g/dL (ref 30.0–36.0)
MCV: 85.9 fL (ref 78.0–100.0)
PLATELETS: 204 10*3/uL (ref 150–400)
RBC: 4.46 MIL/uL (ref 3.87–5.11)
RDW: 14.4 % (ref 11.5–15.5)
WBC: 13 10*3/uL — AB (ref 4.0–10.5)

## 2015-05-05 LAB — URINE MICROSCOPIC-ADD ON

## 2015-05-05 LAB — LIPASE, BLOOD: LIPASE: 30 U/L (ref 22–51)

## 2015-05-05 MED ORDER — DEXTROSE 5 % IV SOLN
1.0000 g | Freq: Once | INTRAVENOUS | Status: AC
  Administered 2015-05-05: 1 g via INTRAVENOUS
  Filled 2015-05-05: qty 10

## 2015-05-05 MED ORDER — ONDANSETRON HCL 4 MG/2ML IJ SOLN
4.0000 mg | Freq: Once | INTRAMUSCULAR | Status: DC | PRN
  Administered 2015-05-05: 4 mg via INTRAVENOUS
  Filled 2015-05-05: qty 2

## 2015-05-05 MED ORDER — IOHEXOL 300 MG/ML  SOLN
50.0000 mL | Freq: Once | INTRAMUSCULAR | Status: AC | PRN
  Administered 2015-05-05: 50 mL via ORAL

## 2015-05-05 MED ORDER — SODIUM CHLORIDE 0.9 % IV BOLUS (SEPSIS)
1000.0000 mL | Freq: Once | INTRAVENOUS | Status: AC
Start: 2015-05-05 — End: 2015-05-05
  Administered 2015-05-05: 1000 mL via INTRAVENOUS

## 2015-05-05 MED ORDER — METRONIDAZOLE 500 MG PO TABS: 500.0000 mg | ORAL_TABLET | Freq: Two times a day (BID) | ORAL | Status: DC

## 2015-05-05 MED ORDER — METRONIDAZOLE 500 MG PO TABS
500.0000 mg | ORAL_TABLET | Freq: Once | ORAL | Status: AC
  Administered 2015-05-05: 500 mg via ORAL
  Filled 2015-05-05: qty 1

## 2015-05-05 MED ORDER — CIPROFLOXACIN HCL 500 MG PO TABS: 500.0000 mg | ORAL_TABLET | Freq: Two times a day (BID) | ORAL | Status: DC

## 2015-05-05 MED ORDER — MORPHINE SULFATE (PF) 4 MG/ML IV SOLN
4.0000 mg | Freq: Once | INTRAVENOUS | Status: AC
  Administered 2015-05-05: 4 mg via INTRAVENOUS
  Filled 2015-05-05: qty 1

## 2015-05-05 MED ORDER — CIPROFLOXACIN HCL 500 MG PO TABS
500.0000 mg | ORAL_TABLET | Freq: Once | ORAL | Status: AC
  Administered 2015-05-05: 500 mg via ORAL
  Filled 2015-05-05: qty 1

## 2015-05-05 MED ORDER — HYDROCODONE-ACETAMINOPHEN 5-325 MG PO TABS: 1.0000 | ORAL_TABLET | ORAL | Status: DC | PRN

## 2015-05-05 MED ORDER — MORPHINE SULFATE (PF) 4 MG/ML IV SOLN
4.0000 mg | INTRAVENOUS | Status: DC | PRN
  Administered 2015-05-05: 4 mg via INTRAVENOUS
  Filled 2015-05-05: qty 1

## 2015-05-05 MED ORDER — IOHEXOL 300 MG/ML  SOLN
100.0000 mL | Freq: Once | INTRAMUSCULAR | Status: AC | PRN
  Administered 2015-05-05: 100 mL via INTRAVENOUS

## 2015-05-05 NOTE — ED Notes (Signed)
Bed: WA07 Expected date:  Expected time:  Means of arrival:  Comments: EMS-flank pain 

## 2015-05-05 NOTE — ED Provider Notes (Signed)
CSN: [id number]     Arrival date & time 05/05/15  1830 History   First MD Initiated Contact with Patient 05/05/15 1833     Chief Complaint  Patient presents with  . Abdominal Pain     (Consider location/radiation/quality/duration/timing/severity/associated sxs/prior Treatment) HPI 64 year old female, otherwise healthy, who presents with abdominal pain. No prior abdominal surgery. Developed abdominal pain at 2:30PM, and progressively worse throughout the day. Never had pain like this before. Pain localized to low abdomen and cramping. Associated with nausea but denies vomiting. Denies fever or chills, melena, hematochezia, or diarrhea. Denies dysuria, urinary frequency.   Past Medical History  Diagnosis Date  . Bowen's disease     excised 1990  . Hx of varicella   . Headache    Past Surgical History  Procedure Laterality Date  . Bunionectomy Right   . Skin cancer excision      bowens disease   Family History  Problem Relation Age of Onset  . Breast cancer      mom and MGM and sister   . Pancreatitis Father   . Cancer Mother     lung  . Cancer Sister     breast/melanoma  . Cancer Maternal Grandmother     breast   . Cancer Maternal Aunt     breast   Social History  Substance Use Topics  . Smoking status: Never Smoker   . Smokeless tobacco: None  . Alcohol Use: 0.0 oz/week    0 Standard drinks or equivalent per week   OB History    No data available     Review of Systems 10/14 systems reviewed and are negative other than those stated in the HPI  Allergies  Benadryl; Melatonin; and Penicillins  Home Medications   Prior to Admission medications   Medication Sig Start Date End Date Taking? Authorizing Provider  aspirin 81 MG tablet Take 81 mg by mouth daily.   Yes Historical Provider, MD  baclofen (LIORESAL) 10 MG tablet TAKE 0.5 TABLETS (5 MG TOTAL) BY MOUTH 3 (THREE) TIMES DAILY. 04/13/15  Yes Lyndal Pulley, DO  Calcium-Magnesium-Vitamin D (CALCIUM MAGNESIUM  PO) Take 2 tablets by mouth daily.   Yes Historical Provider, MD  gabapentin (NEURONTIN) 100 MG capsule Take 3 capsules (300 mg total) by mouth at bedtime. Patient taking differently: Take 100 mg by mouth at bedtime.  04/08/15  Yes Lyndal Pulley, DO  GINSENG KOREAN PO Take by mouth.   Yes Historical Provider, MD  ibuprofen (ADVIL,MOTRIN) 200 MG tablet Take 400 mg by mouth 3 (three) times daily.   Yes Historical Provider, MD  Multiple Vitamin (MULTIVITAMIN) tablet Take 2 tablets by mouth daily.    Yes Historical Provider, MD  NON FORMULARY Take 3 capsules by mouth 3 (three) times daily. Collatrim   Yes Historical Provider, MD  baclofen (LIORESAL) 10 MG tablet Take 1 tablet (10 mg total) by mouth 3 (three) times daily. Patient not taking: Reported on 05/05/2015 04/08/15   Lyndal Pulley, DO  ciprofloxacin (CIPRO) 500 MG tablet Take 1 tablet (500 mg total) by mouth every 12 (twelve) hours. 05/05/15   Forde Dandy, MD  fluocinonide-emollient (LIDEX-E) 0.05 % cream Apply topically 2 (two) times daily. To rash  Not on face . Patient not taking: Reported on 05/05/2015 02/13/13   [name], MD  HYDROcodone-acetaminophen (NORCO/VICODIN) 5-325 MG per tablet Take 1-2 tablets by mouth every 4 (four) hours as needed. 05/05/15   Forde Dandy, MD  meloxicam (MOBIC) 15 MG tablet Take 1 tablet (15 mg total) by mouth daily. Patient not taking: Reported on 05/05/2015 04/08/15   Lyndal Pulley, DO  metroNIDAZOLE (FLAGYL) 500 MG tablet Take 1 tablet (500 mg total) by mouth 2 (two) times daily. 05/05/15   Forde Dandy, MD   BP 119/55 mmHg  Pulse 91  Temp(Src) 98.9 F (37.2 C) (Oral)  Resp 18  SpO2 93% Physical Exam Physical Exam  Nursing note and vitals reviewed. Constitutional: Well developed, well nourished, non-toxic, and in no acute distress Head: Normocephalic and atraumatic.  Mouth/Throat: Oropharynx is clear and moist.  Neck: Normal range of motion. Neck supple.  Cardiovascular: Normal rate and regular  rhythm.   Pulmonary/Chest: Effort normal and breath sounds normal.  Abdominal: Soft. There is no tenderness. There is no rebound and no guarding.  Musculoskeletal: Normal range of motion.  Neurological: Alert, no facial droop, fluent speech, moves all extremities symmetrically Skin: Skin is warm and dry.  Psychiatric: Cooperative  ED Course  Procedures (including critical care time) Labs Review Labs Reviewed  COMPREHENSIVE METABOLIC PANEL - Abnormal; Notable for the following:    Potassium 3.3 (*)    CO2 20 (*)    Glucose, Bld 130 (*)    All other components within normal limits  CBC - Abnormal; Notable for the following:    WBC 13.0 (*)    All other components within normal limits  URINALYSIS, ROUTINE W REFLEX MICROSCOPIC (NOT AT [facility]) - Abnormal; Notable for the following:    APPearance CLOUDY (*)    Ketones, ur >80 (*)    Leukocytes, UA LARGE (*)    All other components within normal limits  URINE MICROSCOPIC-ADD ON - Abnormal; Notable for the following:    Bacteria, UA FEW (*)    Casts HYALINE CASTS (*)    All other components within normal limits  URINE CULTURE  LIPASE, BLOOD    Imaging Review Ct Abdomen Pelvis W Contrast  05/05/2015   CLINICAL DATA:  Acute onset of bilateral lower quadrant abdominal pain which began approximately 8 hr ago and has progressively worsened.  EXAM: CT ABDOMEN AND PELVIS WITH CONTRAST  TECHNIQUE: Multidetector CT imaging of the abdomen and pelvis was performed using the standard protocol following bolus administration of intravenous contrast.  CONTRAST:  151mL OMNIPAQUE IOHEXOL 300 MG/ML IV. Oral contrast was also administered.  COMPARISON:  None.  FINDINGS: Hepatobiliary: Approximate 5 mm simple cyst in the medial segment left lobe of liver. No significant abnormalities involving the liver. Normal appearing gallbladder. Mild extrahepatic biliary ductal dilation up to approximately 13 mm diameter without obstructing stone or mass.  Spleen:  Normal  in size and appearance.  Pancreas:  Normal in appearance.  No pancreatic ductal dilation.  Adrenal glands: Mild enlargement of the left adrenal gland without nodularity. Normal-appearing right adrenal gland.  Genitourinary: Both kidneys normal in size and appearance. No evidence of urinary tract calculi. Normal-appearing decompressed urinary bladder.  Normal-appearing mildly atrophic uterus and normal-appearing ovaries without evidence of adnexal mass or free pelvic fluid.  Gastrointestinal: Normal appearing stomach and small bowel. Sigmoid colon diverticulosis with edema/inflammation in the fat surrounding the proximal sigmoid colon in the left side of the pelvis. No extraluminal gas. No abnormal fluid collection. Remainder of the colon unremarkable. Normal appendix in the right upper pelvis.  Ascites:  Absent.  Vascular: Mild distal abdominal aortic atherosclerosis without aneurysm.  Lymphatic: No pathologic lymphadenopathy in the abdomen or the anatomic pelvis. Solitary mildly enlarged left  superficial inguinal lymph node measuring approximately 2.0 x 2.3 cm.  Other findings: None.  Musculoskeletal: Degenerative disc disease and spondylosis involving the visualized lower thoracic spine and the entire lumbar spine. Facet degenerative changes involving the lower lumbar spine. Moderate degenerative changes involving both hips.  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 Visualized lung bases clear.  IMPRESSION: 1. Mild acute diverticulitis involving the proximal sigmoid colon. No evidence of abscess or perforation. 2. Mild extrahepatic biliary ductal dilation without obstructing stone or mass. 3. Solitary mildly enlarged left superficial inguinal lymph node. No evidence of pathologic lymphadenopathy in the abdomen or anatomic pelvis.   Electronically Signed   By: Evangeline Dakin M.D.   On: 05/05/2015 22:30   I have personally reviewed and evaluated  these images and lab results as part of my medical decision-making.   MDM   Final diagnoses:  Acute diverticulitis    64 year old female who presents with lower abdominal pain for 1 day. She is nontoxic in no acute distress on presentation, with normal vital signs. She has a soft and nonsurgical abdomen, with tenderness in her low abdomen, worse suprapubically. Remainder of exam is unremarkable and nonfocal.basic blood work reveals unremarkable CBC, comprehensive metabolic profile, and lipase. Her urinalysis is positive for leukocytes and 21-50 WBCs, and may be suggestive of acute cystitis.however given that she does not have any symptoms of urinary tract infection, and the significance of her abdominal pain, CT abdomen and pelvis was performed. This is visualized and reviews evidence of acuteuncomplicated sigmoid diverticulitis, which explains her clinical presentation better. Her urine is sent for culture. She has been able to tolerate by mouth without difficulty and pain has been well-controlled. She is a good patient for outpatient management of her diverticulitis. She is given a course of ciprofloxacin and Flagyl and home analgesics. Strict return and follow-up instructions are reviewed. She expresses understanding of all discharge instructions and felt comfortable to plan of care.   Forde Dandy, MD 05/06/15 [phone]

## 2015-05-05 NOTE — Discharge Instructions (Signed)
Return without fail for worsening symptoms, including fever, worsening pain, vomiting or unable to keep down food or fluids, or any other symptoms concerning to you. Take antibiotics as prescribed.  Diverticulitis Diverticulitis is when small pockets that have formed in your colon (large intestine) become infected or swollen. HOME CARE  Follow your doctor's instructions.  Follow a special diet if told by your doctor.  When you feel better, your doctor may tell you to change your diet. You may be told to eat a lot of fiber. Fruits and vegetables are good sources of fiber. Fiber makes it easier to poop (have bowel movements).  Take supplements or probiotics as told by your doctor.  Only take medicines as told by your doctor.  Keep all follow-up visits with your doctor. GET HELP IF:  Your pain does not get better.  You have a hard time eating food.  You are not pooping like normal. GET HELP RIGHT AWAY IF:  Your pain gets worse.  Your problems do not get better.  Your problems suddenly get worse.  You have a fever.  You keep throwing up (vomiting).  You have bloody or black, tarry poop (stool). MAKE SURE YOU:   Understand these instructions.  Will watch your condition.  Will get help right away if you are not doing well or get worse. Document Released: 02/22/2008 Document Revised: 09/10/2013 Document Reviewed: 07/31/2013 [facility] Patient Information 2015 [place], [place]. This information is not intended to replace advice given to you by your health care provider. Make sure you discuss any questions you have with your health care provider.

## 2015-05-05 NOTE — ED Notes (Signed)
Per EMS, pt from home.  Pt c/o rt flank pain radiating to front.  Hx of kidney stone x 20 years ago.  Fentanyl 164mcg given in route.  Pain reduced to 2/10.  IV Lt hand 18 g. Vitals: 156/96, hr 104, temp 100.8, cbg 110, tylenol 1000 mg in route.

## 2015-05-06 ENCOUNTER — Ambulatory Visit: Payer: Commercial Managed Care - HMO | Admitting: Family Medicine

## 2015-05-06 ENCOUNTER — Telehealth: Payer: Self-pay | Admitting: Internal Medicine

## 2015-05-06 DIAGNOSIS — N3090 Cystitis, unspecified without hematuria: Secondary | ICD-10-CM | POA: Diagnosis present

## 2015-05-06 NOTE — Telephone Encounter (Signed)
Spoke to the pt.  She started taking antibiotics today.  Feels very tired.  Advised that she complete all antibiotics.  Appt scheduled for 05/11/15.  Advised to call back if needed.

## 2015-05-06 NOTE — Telephone Encounter (Signed)
ask her how she is doing  Can be seen Friday or Monday  Depending on hwo she is feeling

## 2015-05-06 NOTE — Telephone Encounter (Signed)
Pt was at [facility] last night and was told to follow up in two to three days. Dxs  uti and diverticulitis. Please advise

## 2015-05-07 LAB — URINE CULTURE

## 2015-05-11 ENCOUNTER — Ambulatory Visit (INDEPENDENT_AMBULATORY_CARE_PROVIDER_SITE_OTHER): Payer: Commercial Managed Care - HMO | Admitting: Internal Medicine

## 2015-05-11 ENCOUNTER — Encounter: Payer: Self-pay | Admitting: Internal Medicine

## 2015-05-11 VITALS — BP 140/86 | Temp 98.4°F | Ht 62.0 in | Wt 202.1 lb

## 2015-05-11 DIAGNOSIS — M542 Cervicalgia: Secondary | ICD-10-CM

## 2015-05-11 DIAGNOSIS — E876 Hypokalemia: Secondary | ICD-10-CM | POA: Diagnosis not present

## 2015-05-11 DIAGNOSIS — K5732 Diverticulitis of large intestine without perforation or abscess without bleeding: Secondary | ICD-10-CM | POA: Diagnosis not present

## 2015-05-11 DIAGNOSIS — R739 Hyperglycemia, unspecified: Secondary | ICD-10-CM

## 2015-05-11 DIAGNOSIS — R599 Enlarged lymph nodes, unspecified: Secondary | ICD-10-CM

## 2015-05-11 LAB — BASIC METABOLIC PANEL
BUN: 12 mg/dL (ref 6–23)
CALCIUM: 9.5 mg/dL (ref 8.4–10.5)
CHLORIDE: 104 meq/L (ref 96–112)
CO2: 29 mEq/L (ref 19–32)
CREATININE: 0.61 mg/dL (ref 0.40–1.20)
GFR: 104.39 mL/min (ref >60.00)
Glucose, Bld: 86 mg/dL (ref 70–99)
Potassium: 4 mEq/L (ref 3.5–5.1)
Sodium: 139 mEq/L (ref 135–145)

## 2015-05-11 LAB — HEMOGLOBIN A1C: HEMOGLOBIN A1C: 5.5 % (ref 4.6–6.5)

## 2015-05-11 NOTE — Patient Instructions (Addendum)
Glad you are doing better   I will put in an official referral  To dr Tamala Julian as I agree with you seeing him for your situation.  Advance diet as tolererated in  2-3 weeks can eat regular    High fiber .   theere is a small swollen glandleft groin  Follow not concerning unless increasing in size for no reason.   Diverticulitis Diverticulitis is inflammation or infection of small pouches in your colon that form when you have a condition called diverticulosis. The pouches in your colon are called diverticula. Your colon, or large intestine, is where water is absorbed and stool is formed. Complications of diverticulitis can include:  Bleeding.  Severe infection.  Severe pain.  Perforation of your colon.  Obstruction of your colon. CAUSES  Diverticulitis is caused by bacteria. Diverticulitis happens when stool becomes trapped in diverticula. This allows bacteria to grow in the diverticula, which can lead to inflammation and infection. RISK FACTORS People with diverticulosis are at risk for diverticulitis. Eating a diet that does not include enough fiber from fruits and vegetables may make diverticulitis more likely to develop. SYMPTOMS  Symptoms of diverticulitis may include:  Abdominal pain and tenderness. The pain is normally located on the left side of the abdomen, but may occur in other areas.  Fever and chills.  Bloating.  Cramping.  Nausea.  Vomiting.  Constipation.  Diarrhea.  Blood in your stool. DIAGNOSIS  Your health care provider will ask you about your medical history and do a physical exam. You may need to have tests done because many medical conditions can cause the same symptoms as diverticulitis. Tests may include:  Blood tests.  Urine tests.  Imaging tests of the abdomen, including X-rays and CT scans. When your condition is under control, your health care provider may recommend that you have a colonoscopy. A colonoscopy can show how severe your  diverticula are and whether something else is causing your symptoms. TREATMENT  Most cases of diverticulitis are mild and can be treated at home. Treatment may include:  Taking over-the-counter pain medicines.  Following a clear liquid diet.  Taking antibiotic medicines by mouth for 7-10 days. More severe cases may be treated at a hospital. Treatment may include:  Not eating or drinking.  Taking prescription pain medicine.  Receiving antibiotic medicines through an IV tube.  Receiving fluids and nutrition through an IV tube.  Surgery. HOME CARE INSTRUCTIONS   Follow your health care provider's instructions carefully.  Follow a full liquid diet or other diet as directed by your health care provider. After your symptoms improve, your health care provider may tell you to change your diet. He or she may recommend you eat a high-fiber diet. Fruits and vegetables are good sources of fiber. Fiber makes it easier to pass stool.  Take fiber supplements or probiotics as directed by your health care provider.  Only take medicines as directed by your health care provider.  Keep all your follow-up appointments. SEEK MEDICAL CARE IF:   Your pain does not improve.  You have a hard time eating food.  Your bowel movements do not return to normal. SEEK IMMEDIATE MEDICAL CARE IF:   Your pain becomes worse.  Your symptoms do not get better.  Your symptoms suddenly get worse.  You have a fever.  You have repeated vomiting.  You have bloody or black, tarry stools. MAKE SURE YOU:   Understand these instructions.  Will watch your condition.  Will get help right  away if you are not doing well or get worse. Document Released: 06/15/2005 Document Revised: 09/10/2013 Document Reviewed: 07/31/2013 [facility] Patient Information 2015 [place], [place]. This information is not intended to replace advice given to you by your health care provider. Make sure you discuss any questions you have  with your health care provider.

## 2015-05-11 NOTE — Progress Notes (Signed)
Pre visit review using our clinic review tool, if applicable. No additional management support is needed unless otherwise documented below in the visit note.  Chief Complaint  Patient presents with  . ED Follow Up    diverticulitis  low K    HPI: Patient come in for follow up from ED visit:  8 /16 for acute abd pain with mild diverticulutus by  Ct scan and rx as op with oral antibiotics and observation.  Pain in abdomen about 95 % better .  Diarrhea     Have one- 2 pills left.  No fever now.  Eating   ... Ok jjuuices and eggs and mushy foods.    Has  A ha in am  And now .    Drinks coffee .  HA  Still with bad waxes and wanes  .  No  Neuro sx with this   No falling  Balance is ok   Has been seeing dr Tamala Julian referred by dr Loretta Plume for poss neck induced headaches .  Insurance has denied cause no referral  ROS: See pertinent positives and negatives per HPI. No cv pulm neuro sx   No blood in stool last colon was2012 c showed smll left inguinal reactive   Past Medical History  Diagnosis Date  . Bowen's disease     excised 1990  . Hx of varicella   . Headache     Family History  Problem Relation Age of Onset  . Breast cancer      mom and MGM and sister   . Pancreatitis Father   . Cancer Mother     lung  . Cancer Sister     breast/melanoma  . Cancer Maternal Grandmother     breast   . Cancer Maternal Aunt     breast    Social History   Social History  . Marital Status: Married    Spouse Name: N/A  . Number of Children: N/A  . Years of Education: N/A   Social History Main Topics  . Smoking status: Never Smoker   . Smokeless tobacco: None  . Alcohol Use: 0.0 oz/week    0 Standard drinks or equivalent per week  . Drug Use: Yes    Special: Marijuana  . Sexual Activity: No   Other Topics Concern  . None   Social History Narrative   H H  of 1      5 pets.   She is a former smoker   Retired Engineer, mining fine arts   Husband smokes he has active  heart disease and vascular disease   etoh   Red wine  1 per night.    Tea green tea and earl gray    Moved from [place] to [place] area in 01-Jan-1984   1 pregnancy   Husband died spring  2016   Sister died Apr 02, 2015                 Outpatient Prescriptions Prior to Visit  Medication Sig Dispense Refill  . aspirin 81 MG tablet Take 81 mg by mouth daily.    . baclofen (LIORESAL) 10 MG tablet TAKE 0.5 TABLETS (5 MG TOTAL) BY MOUTH 3 (THREE) TIMES DAILY. 90 tablet 0  . Calcium-Magnesium-Vitamin D (CALCIUM MAGNESIUM PO) Take 2 tablets by mouth daily.    . ciprofloxacin (CIPRO) 500 MG tablet Take 1 tablet (500 mg total) by mouth every 12 (twelve) hours. 14 tablet 0  . gabapentin (NEURONTIN) 100 MG  capsule Take 3 capsules (300 mg total) by mouth at bedtime. (Patient taking differently: Take 100 mg by mouth at bedtime. ) 90 capsule 1  . GINSENG KOREAN PO Take by mouth.    Marland Kitchen HYDROcodone-acetaminophen (NORCO/VICODIN) 5-325 MG per tablet Take 1-2 tablets by mouth every 4 (four) hours as needed. 12 tablet 0  . ibuprofen (ADVIL,MOTRIN) 200 MG tablet Take 400 mg by mouth 3 (three) times daily.    . metroNIDAZOLE (FLAGYL) 500 MG tablet Take 1 tablet (500 mg total) by mouth 2 (two) times daily. 14 tablet 0  . Multiple Vitamin (MULTIVITAMIN) tablet Take 2 tablets by mouth daily.     . NON FORMULARY Take 3 capsules by mouth 3 (three) times daily. Collatrim    . baclofen (LIORESAL) 10 MG tablet Take 1 tablet (10 mg total) by mouth 3 (three) times daily. (Patient not taking: Reported on 05/05/2015) 90 each 0  . fluocinonide-emollient (LIDEX-E) 0.05 % cream Apply topically 2 (two) times daily. To rash  Not on face . (Patient not taking: Reported on 05/05/2015) 15 g 1  . meloxicam (MOBIC) 15 MG tablet Take 1 tablet (15 mg total) by mouth daily. (Patient not taking: Reported on 05/05/2015) 90 tablet 0   No facility-administered medications prior to visit.     EXAM:  BP 140/86 mmHg  Temp(Src) 98.4 F (36.9 C)  (Oral)  Ht 5\' 2"  (1.575 m)  Wt 202 lb 1.6 oz (91.672 kg)  BMI 36.96 kg/m2  Body mass index is 36.96 kg/(m^2).  GENERAL: vitals reviewed and listed above, alert, oriented, appears well hydrated and in no acute distress HEENT: atraumatic, conjunctiva  clear, no obvious abnormalities on inspection of external nose and ears OP : no lesion edema or exudate  NECK: no obvious masses on inspection palpation  LUNGS: clear to auscultation bilaterally, no wheezes, rales or rhonchi, good air movement CV: HRRR, no clubbing cyanosis or  peripheral edema nl cap refill  Abdomen:  Sof,t normal bowel sounds without hepatosplenomegaly, no guarding rebound or masses no CVA tenderness points to  Left lower as area of prev tenderness leftnguinal soilatar mobile 1.5  Mobile non tender  Ln no other adenopathy MS: moves all extremities without noticeable focal  abnormality PSYCH: pleasant and cooperative, no obvious depression or anxiety Lab Results  Component Value Date   WBC 13.0* 05/05/2015   HGB 12.6 05/05/2015   HCT 38.3 05/05/2015   PLT 204 05/05/2015   GLUCOSE 86 05/11/2015   CHOL 165 03/11/2015   TRIG 66.0 03/11/2015   HDL 67.30 03/11/2015   LDLCALC 85 03/11/2015   ALT 22 05/05/2015   AST 31 05/05/2015   NA 139 05/11/2015   K 4.0 05/11/2015   CL 104 05/11/2015   CREATININE 0.61 05/11/2015   BUN 12 05/11/2015   CO2 29 05/11/2015   TSH 3.29 03/11/2015   HGBA1C 5.5 05/11/2015    ASSESSMENT AND PLAN:  Discussed the following assessment and plan:  Diverticulitis of colon without hemorrhage - straight forward and although never had before last colon 2014 and no unusal findings  will follow  if recurring  to gi etc   Hypokalemia - repeat  as op now taking  nl fluids  - Plan: Basic metabolic panel, Hemoglobin A1c  Hyperglycemia - Plan: Basic metabolic panel, Hemoglobin A1c  NECK PAIN - under care dr Tamala Julian   and dr Tomi Likens  ? if needs official referral?  will re refer  - Plan: Ambulatory referral  to Sports Medicine  Palpable  lymph node - left inguinal seen on ct seems benign reactive   follow for alarm sx  Repeat bmp and a1c   -Patient advised to return or notify health care team  if symptoms worsen ,persist or new concerns arise.  Patient Instructions  Glad you are doing better   I will put in an official referral  To dr Tamala Julian as I agree with you seeing him for your situation.  Advance diet as tolererated in  2-3 weeks can eat regular    High fiber .   theere is a small swollen glandleft groin  Follow not concerning unless increasing in size for no reason.   Diverticulitis Diverticulitis is inflammation or infection of small pouches in your colon that form when you have a condition called diverticulosis. The pouches in your colon are called diverticula. Your colon, or large intestine, is where water is absorbed and stool is formed. Complications of diverticulitis can include:  Bleeding.  Severe infection.  Severe pain.  Perforation of your colon.  Obstruction of your colon. CAUSES  Diverticulitis is caused by bacteria. Diverticulitis happens when stool becomes trapped in diverticula. This allows bacteria to grow in the diverticula, which can lead to inflammation and infection. RISK FACTORS People with diverticulosis are at risk for diverticulitis. Eating a diet that does not include enough fiber from fruits and vegetables may make diverticulitis more likely to develop. SYMPTOMS  Symptoms of diverticulitis may include:  Abdominal pain and tenderness. The pain is normally located on the left side of the abdomen, but may occur in other areas.  Fever and chills.  Bloating.  Cramping.  Nausea.  Vomiting.  Constipation.  Diarrhea.  Blood in your stool. DIAGNOSIS  Your health care provider will ask you about your medical history and do a physical exam. You may need to have tests done because many medical conditions can cause the same symptoms as  diverticulitis. Tests may include:  Blood tests.  Urine tests.  Imaging tests of the abdomen, including X-rays and CT scans. When your condition is under control, your health care provider may recommend that you have a colonoscopy. A colonoscopy can show how severe your diverticula are and whether something else is causing your symptoms. TREATMENT  Most cases of diverticulitis are mild and can be treated at home. Treatment may include:  Taking over-the-counter pain medicines.  Following a clear liquid diet.  Taking antibiotic medicines by mouth for 7-10 days. More severe cases may be treated at a hospital. Treatment may include:  Not eating or drinking.  Taking prescription pain medicine.  Receiving antibiotic medicines through an IV tube.  Receiving fluids and nutrition through an IV tube.  Surgery. HOME CARE INSTRUCTIONS   Follow your health care provider's instructions carefully.  Follow a full liquid diet or other diet as directed by your health care provider. After your symptoms improve, your health care provider may tell you to change your diet. He or she may recommend you eat a high-fiber diet. Fruits and vegetables are good sources of fiber. Fiber makes it easier to pass stool.  Take fiber supplements or probiotics as directed by your health care provider.  Only take medicines as directed by your health care provider.  Keep all your follow-up appointments. SEEK MEDICAL CARE IF:   Your pain does not improve.  You have a hard time eating food.  Your bowel movements do not return to normal. SEEK IMMEDIATE MEDICAL CARE IF:   Your pain becomes worse.  Your symptoms  do not get better.  Your symptoms suddenly get worse.  You have a fever.  You have repeated vomiting.  You have bloody or black, tarry stools. MAKE SURE YOU:   Understand these instructions.  Will watch your condition.  Will get help right away if you are not doing well or get  worse. Document Released: 06/15/2005 Document Revised: 09/10/2013 Document Reviewed: 07/31/2013 [facility] Patient Information 2015 [place], [place]. This information is not intended to replace advice given to you by your health care provider. Make sure you discuss any questions you have with your health care provider.      Standley Brooking. Panosh M.D.

## 2015-05-12 DIAGNOSIS — M542 Cervicalgia: Secondary | ICD-10-CM | POA: Diagnosis not present

## 2015-05-14 DIAGNOSIS — M542 Cervicalgia: Secondary | ICD-10-CM | POA: Diagnosis not present

## 2015-05-15 ENCOUNTER — Ambulatory Visit (INDEPENDENT_AMBULATORY_CARE_PROVIDER_SITE_OTHER): Payer: Commercial Managed Care - HMO | Admitting: Family Medicine

## 2015-05-15 ENCOUNTER — Encounter: Payer: Self-pay | Admitting: Family Medicine

## 2015-05-15 VITALS — BP 120/80 | HR 78 | Ht 62.0 in | Wt 200.0 lb

## 2015-05-15 DIAGNOSIS — M5030 Other cervical disc degeneration, unspecified cervical region: Secondary | ICD-10-CM

## 2015-05-15 NOTE — Progress Notes (Signed)
Pre visit review using our clinic review tool, if applicable. No additional management support is needed unless otherwise documented below in the visit note. 

## 2015-05-15 NOTE — Patient Instructions (Signed)
Good to see you Try the meloxicam daily instead of the advil Gabapentin at night and try 200mg  and see how it is Baclofen is still good pennsaid pinkie amount topically 2 times daily as needed.  Consider Tart cherry extract at night See me again in 2 months!!!!

## 2015-05-15 NOTE — Progress Notes (Signed)
[facility] [address] [place], [place] [address] Phone: [phone] Subjective:    [place]: Neck pain follow-up  [id number] Ashley Moore is a 64 y.o. female coming in with complaint of neck pain. Patient does have moderate osteophytic changes of the neck at multiple levels. Patient's was treated for trigger point injections and was given home exercises. Patient was to do an icing protocol. Patient was given a muscle relaxer. Patient at last visit was started on gabapentin as well as sent to formal physical therapy. Patient states she is doing much better but had a recent hospitalization. Patient states that as long she is been doing exercises and going to physical therapy she is much better. States that she is 65-70% better. Has noticed some increased range of motion. Does do icing when needed. States that if she tries to increase her gabapentin she's had some trouble with  Patient did have x-rays after last exam that does show that patient has moderate to severe osteophytic changes in multiple levels of the cervical spine.  Past Medical History  Diagnosis Date  . Bowen's disease     excised 1990  . Hx of varicella   . Headache    Past Surgical History  Procedure Laterality Date  . Bunionectomy Right   . Skin cancer excision      bowens disease   Social History  Substance Use Topics  . Smoking status: Never Smoker   . Smokeless tobacco: None  . Alcohol Use: 0.0 oz/week    0 Standard drinks or equivalent per week   Family History  Problem Relation Age of Onset  . Breast cancer      mom and MGM and sister   . Pancreatitis Father   . Cancer Mother     lung  . Cancer Sister     breast/melanoma  . Cancer Maternal Grandmother     breast   . Cancer Maternal Aunt     breast   Allergies  Allergen Reactions  . Benadryl [Diphenhydramine] Other (See Comments)    Tingle all over   . Melatonin     Tingle all over   . Penicillins Rash         Past medical history, social, surgical and family history all reviewed in electronic medical record.   Review of Systems: No headache, visual changes, nausea, vomiting, diarrhea, constipation, dizziness, abdominal pain, skin rash, fevers, chills, night sweats, weight loss, swollen lymph nodes, body aches, joint swelling, muscle aches, chest pain, shortness of breath, mood changes.   Objective Blood pressure 120/80, pulse 78, height 5\' 2"  (1.575 m), weight 200 lb (90.719 kg), SpO2 97 %.  General: No apparent distress alert and oriented x3 mood and affect normal, dressed appropriately.  HEENT: Pupils equal, extraocular movements intact  Respiratory: Patient's speak in full sentences and does not appear short of breath  Cardiovascular: No lower extremity edema, non tender, no erythema  Skin: Warm dry intact with no signs of infection or rash on extremities or on axial skeleton.  Abdomen: Soft nontender  Neuro: Cranial nerves II through XII are intact, neurovascularly intact in all extremities with 2+ DTRs and 2+ pulses.  Lymph: No lymphadenopathy of posterior or anterior cervical chain or axillae bilaterally.  Gait normal with good balance and coordination.  MSK:  Non tender with full range of motion and good stability and symmetric strength and tone of shoulders, elbows, wrist, hip, knee and ankles bilaterally.  Neck: Loss of lordosis  No palpable stepoffs. Negative Spurling's maneuver. significant decrease in range of motion lacking the last 10 of extension as well as any type of rotation bilaterally.significant improvement Grip strength and sensation normal in bilateral hands Strength good C4 to T1 distribution No sensory change to C4 to T1 Negative Hoffman sign bilaterally Reflexes normal    Impression and Recommendations:     This case required medical decision making of moderate complexity.

## 2015-05-15 NOTE — Assessment & Plan Note (Signed)
Patient does have severe osteophytic changes but has made some good progress. Patient has increased her range of motion slowly. Patient even had a setback and was hospitalized for diverticulitis. We did make a change in patient will stop taking the ibuprofen and start taking the meloxicam on a more regular basis. We discussed icing regimen and home exercises. We discussed different medication changes please see patient instructions. Patient come back and see me again in 6-8 weeks.  Spent  25 minutes with patient face-to-face and had greater than 50% of counseling including as described above in assessment and plan.

## 2015-05-28 ENCOUNTER — Ambulatory Visit (INDEPENDENT_AMBULATORY_CARE_PROVIDER_SITE_OTHER): Payer: Medicare HMO | Admitting: Psychology

## 2015-05-28 DIAGNOSIS — F4323 Adjustment disorder with mixed anxiety and depressed mood: Secondary | ICD-10-CM

## 2015-05-29 ENCOUNTER — Ambulatory Visit (INDEPENDENT_AMBULATORY_CARE_PROVIDER_SITE_OTHER): Payer: Commercial Managed Care - HMO | Admitting: Neurology

## 2015-05-29 ENCOUNTER — Encounter: Payer: Self-pay | Admitting: Neurology

## 2015-05-29 VITALS — BP 114/76 | HR 75 | Temp 97.9°F | Resp 15 | Ht 62.0 in | Wt 199.7 lb

## 2015-05-29 DIAGNOSIS — R519 Headache, unspecified: Secondary | ICD-10-CM

## 2015-05-29 DIAGNOSIS — R51 Headache: Secondary | ICD-10-CM

## 2015-05-29 DIAGNOSIS — M5030 Other cervical disc degeneration, unspecified cervical region: Secondary | ICD-10-CM

## 2015-05-29 NOTE — Patient Instructions (Addendum)
1.  We will get MRI of brain with and without contrast.  Further recommendations pending these results 2.  Continue gabapentin in the meantime but call if we need to adjust dose or change medication 3.  Follow up in 3 months but call with any problems 4.  Contact Dr. Regis Bill regarding gingival infection

## 2015-05-29 NOTE — Progress Notes (Signed)
NEUROLOGY FOLLOW UP OFFICE NOTE  Ashley Moore [id number]  HISTORY OF PRESENT ILLNESS: Ashley Moore is a 64 year old right-handed woman with degenerative arthritis of the cervical spine who follows up for cervicogenic headache.  History obtained by patient, Dr. Thompson Caul note and ED note.  Cervical Xray reviewed.  UPDATE: She is taking gabapentin 100mg  in the morning and 200mg  at night.  It helps a little bit but she is concerned that increasing the dose will cause too many cognitive side effects.  She was referred to Dr. Tamala Julian for OMM.  Cervical spine Xray from 03/26/15 showed multilevel spondylosis.  Her neck is much better but she continues to have the headaches.  She was diagnosed with diverticulitis about a month ago.  She received antibiotics and when she finished it, she began to have nasal discharge and increased headache.  She saw her dentist who told her she had an infection in her teeth or gums, but she was not prescribed antibiotics.  HISTORY: On 10/28/14, her husband fell.  When she tried to get him up, she developed this severe pain in the back of her head.  She didn't notice pulling her neck.  She has had a constant occipital headache since then.  It is located on the left side.  When she moves her neck or head slightly in any direction, she feels a burning shooting pain up to the front of the head and down the left side of her neck to the shoulder.  She denies any numbness or tingling in the back of the head or in the extremities.  She denies visual symptoms, nausea, photophobia, phonophobia or dizziness.  Besides head movement, stress also exacerbates it.  Applying pressure to the area seems to help.  She has suffered the loss of both her husband and sister in the past several weeks.  More recently, she notes cracking in her neck when she moves.  Since the occurrence, she has been taking Tylenol daily.  She also tried heating pad and ice, which are not too effective.  Massage  only briefly helps for about 10 minutes.  PAST MEDICAL HISTORY: Past Medical History  Diagnosis Date  . Bowen's disease     excised 1990  . Hx of varicella   . Headache     MEDICATIONS: Current Outpatient Prescriptions on File Prior to Visit  Medication Sig Dispense Refill  . aspirin 81 MG tablet Take 81 mg by mouth daily.    . baclofen (LIORESAL) 10 MG tablet TAKE 0.5 TABLETS (5 MG TOTAL) BY MOUTH 3 (THREE) TIMES DAILY. 90 tablet 0  . Calcium-Magnesium-Vitamin D (CALCIUM MAGNESIUM PO) Take 2 tablets by mouth daily.    Marland Kitchen gabapentin (NEURONTIN) 100 MG capsule Take 3 capsules (300 mg total) by mouth at bedtime. (Patient taking differently: Take 100 mg by mouth. 1 capsule in the morning and 2 capsules at bedtime.) 90 capsule 1  . GINSENG KOREAN PO Take by mouth.    . Multiple Vitamin (MULTIVITAMIN) tablet Take 2 tablets by mouth daily.     . NON FORMULARY Take 3 capsules by mouth 3 (three) times daily. Collatrim     No current facility-administered medications on file prior to visit.    ALLERGIES: Allergies  Allergen Reactions  . Benadryl [Diphenhydramine] Other (See Comments)    Tingle all over   . Melatonin     Tingle all over   . Penicillins Rash    FAMILY HISTORY: Family History  Problem Relation Age of  Onset  . Breast cancer      mom and MGM and sister   . Pancreatitis Father   . Cancer Mother     lung  . Cancer Sister     breast/melanoma  . Cancer Maternal Grandmother     breast   . Cancer Maternal Aunt     breast    SOCIAL HISTORY: Social History   Social History  . Marital Status: Married    Spouse Name: N/A  . Number of Children: N/A  . Years of Education: N/A   Occupational History  . Not on file.   Social History Main Topics  . Smoking status: Never Smoker   . Smokeless tobacco: Not on file  . Alcohol Use: 0.0 oz/week    0 Standard drinks or equivalent per week     Comment: occasional  . Drug Use: Yes    Special: Marijuana  . Sexual  Activity: No   Other Topics Concern  . Not on file   Social History Narrative   H H  of 1      5 pets.   She is a former smoker   Retired Engineer, mining fine arts   Husband smokes he has active heart disease and vascular disease   etoh   Red wine  1 per night.    Tea green tea and earl gray    Moved from [place] to [place] area in 01-12-1984   1 pregnancy   Husband died spring  2016   Sister died 03-14-15                 REVIEW OF SYSTEMS: Constitutional: No fevers, chills, or sweats, no generalized fatigue, change in appetite Eyes: No visual changes, double vision, eye pain Ear, nose and throat: No hearing loss, ear pain, nasal congestion, sore throat Cardiovascular: No chest pain, palpitations Respiratory:  No shortness of breath at rest or with exertion, wheezes GastrointestinaI: No nausea, vomiting, diarrhea, abdominal pain, fecal incontinence Genitourinary:  No dysuria, urinary retention or frequency Musculoskeletal:  No neck pain, back pain Integumentary: No rash, pruritus, skin lesions Neurological: as above Psychiatric: No depression, insomnia, anxiety Endocrine: No palpitations, fatigue, diaphoresis, mood swings, change in appetite, change in weight, increased thirst Hematologic/Lymphatic:  No anemia, purpura, petechiae. Allergic/Immunologic: no itchy/runny eyes, nasal congestion, recent allergic reactions, rashes  PHYSICAL EXAM: Filed Vitals:   05/29/15 1051  BP: 114/76  Pulse: 75  Temp: 97.9 F (36.6 C)  Resp: 15   General: No acute distress.  Patient appears well-groomed.   Head:  Normocephalic/atraumatic.  Tenderness to palpation behind the left ear. Eyes:  Fundoscopic exam unremarkable without vessel changes, exudates, hemorrhages or papilledema. Neck: supple, no paraspinal tenderness, decreased but improved range of motion involving head turns.  Turning head up and down significantly improved. Heart:  Regular rate and rhythm Lungs:   Clear to auscultation bilaterally Back: No paraspinal tenderness Neurological Exam: alert and oriented to person, place, and time. Attention span and concentration intact, recent and remote memory intact, fund of knowledge intact.  Speech fluent and not dysarthric, language intact.  CN II-XII intact. Fundoscopic exam unremarkable without vessel changes, exudates, hemorrhages or papilledema.  Bulk and tone normal, muscle strength 5/5 throughout.  Sensation to light touch, temperature and vibration intact.  Deep tendon reflexes 2+ throughout, toes downgoing.  Finger to nose and heel to shin testing intact.  Gait normal, Romberg negative.  IMPRESSION: Occipital headaches Cervical degenerative disc disease  PLAN: Since she  has not had any improvement in headaches, we will proceed with MRI of brain with and without contrast.  Further recommendations pending results. She will continue with gabapentin at current dose for now, but will contact us if this needs to be changed. Advised to address possible antibiotics for dental or gingival infection with PCP or dentist Follow up in 2-3 months.   Metta Clines, DO  CC:  Shanon Ace, MD

## 2015-06-03 ENCOUNTER — Other Ambulatory Visit: Payer: Self-pay | Admitting: Family Medicine

## 2015-06-03 DIAGNOSIS — M542 Cervicalgia: Secondary | ICD-10-CM | POA: Diagnosis not present

## 2015-06-03 NOTE — Telephone Encounter (Signed)
Refill done.  

## 2015-06-05 ENCOUNTER — Ambulatory Visit: Admission: RE | Admit: 2015-06-05 | Payer: Commercial Managed Care - HMO | Source: Ambulatory Visit

## 2015-06-05 ENCOUNTER — Telehealth: Payer: Self-pay | Admitting: Neurology

## 2015-06-05 NOTE — Telephone Encounter (Signed)
Called patient and she said that MRI has been approved and is scheduled for September 20.

## 2015-06-05 NOTE — Telephone Encounter (Signed)
Called back upset again/ about an MRI to be scheduled in [place], [place]?//call back @ [phone]

## 2015-06-05 NOTE — Telephone Encounter (Signed)
Pt called concerning referral for an MRI/ insurance approval?//call back @ [phone]

## 2015-06-05 NOTE — Telephone Encounter (Signed)
Patient called and was very upset about appt being cancelled. She states that she would like a call back when the approval has been obtained. Thanks

## 2015-06-10 DIAGNOSIS — M542 Cervicalgia: Secondary | ICD-10-CM | POA: Diagnosis not present

## 2015-06-11 ENCOUNTER — Ambulatory Visit (INDEPENDENT_AMBULATORY_CARE_PROVIDER_SITE_OTHER): Payer: Commercial Managed Care - HMO | Admitting: Internal Medicine

## 2015-06-11 ENCOUNTER — Encounter: Payer: Self-pay | Admitting: Internal Medicine

## 2015-06-11 VITALS — BP 150/86 | Temp 98.3°F | Ht 62.0 in | Wt 199.2 lb

## 2015-06-11 DIAGNOSIS — M542 Cervicalgia: Secondary | ICD-10-CM | POA: Diagnosis not present

## 2015-06-11 DIAGNOSIS — Z23 Encounter for immunization: Secondary | ICD-10-CM

## 2015-06-11 DIAGNOSIS — IMO0001 Reserved for inherently not codable concepts without codable children: Secondary | ICD-10-CM

## 2015-06-11 DIAGNOSIS — G4452 New daily persistent headache (NDPH): Secondary | ICD-10-CM

## 2015-06-11 DIAGNOSIS — R030 Elevated blood-pressure reading, without diagnosis of hypertension: Secondary | ICD-10-CM

## 2015-06-11 DIAGNOSIS — Z634 Disappearance and death of family member: Secondary | ICD-10-CM

## 2015-06-11 DIAGNOSIS — Z789 Other specified health status: Secondary | ICD-10-CM

## 2015-06-11 NOTE — Progress Notes (Signed)
Pre visit review using our clinic review tool, if applicable. No additional management support is needed unless otherwise documented below in the visit note.  Chief Complaint  Patient presents with  . Follow-up    multiple  issues     HPI: Ashley Moore 64 y.o.   Fu of multiple issues   Problematic ha left neck and below chin to have mri head and neck next week  Feels like infection on left side of  Head  On further ?s  Gum issues and  supposedd  to see  Periodontist   But delayed for many reasons  Then got this pain when snapped her head to side  helping her husband now deceased  4 months ago   Headaches are problematic .    Upper  Periodontist   Has been seen  Husband sis and friend died . With in the lat 6 months or so . House was under construction other issues  Has friends  Martin Majestic to see counselor who felt she could have demeita and making up  The current scenarios.       meds for pain make her drowsy.   Tends to be  A bit ocd and organized has been tough recently .  Correction bowens was in 1992  ROS: See pertinent positives and negatives per HPI.  Past Medical History  Diagnosis Date  . Bowen's disease     excised 1992  . Hx of varicella   . Headache     Family History  Problem Relation Age of Onset  . Breast cancer      mom and MGM and sister   . Pancreatitis Father   . Cancer Mother     lung  . Cancer Sister     breast/melanoma  . Cancer Maternal Grandmother     breast   . Cancer Maternal Aunt     breast    Social History   Social History  . Marital Status: Married    Spouse Name: N/A  . Number of Children: N/A  . Years of Education: N/A   Social History Main Topics  . Smoking status: Never Smoker   . Smokeless tobacco: None  . Alcohol Use: 0.0 oz/week    0 Standard drinks or equivalent per week     Comment: occasional  . Drug Use: Yes    Special: Marijuana  . Sexual Activity: No   Other Topics Concern  . None   Social History Narrative    H H  of 1      5 pets.   She is a former smoker   Retired Engineer, mining fine arts   Husband smokes he has active heart disease and vascular disease   etoh   Red wine  1 per night.    Tea green tea and earl gray    [place]oved from [place] to [place] area in 1984/01/08   1 pregnancy   Husband died spring  2016 cv   Sister died 2015/03/10  Bone cancer                 Outpatient Prescriptions Prior to Visit  Medication Sig Dispense Refill  . aspirin 81 MG tablet Take 81 mg by mouth daily.    . baclofen (LIORESAL) 10 MG tablet TAKE 1/2 TABLET BY MOUTH 3 TIMES A DAY 90 tablet 0  . Calcium-Magnesium-Vitamin D (CALCIUM MAGNESIUM PO) Take 2 tablets by mouth daily.    Marland Kitchen gabapentin (NEURONTIN) 100 MG  capsule Take 3 capsules (300 mg total) by mouth at bedtime. (Patient taking differently: Take 100 mg by mouth. 1 capsule in the morning and 2 capsules at bedtime.) 90 capsule 1  . GINSENG KOREAN PO Take by mouth.    . meloxicam (MOBIC) 15 MG tablet Take 15 mg by mouth daily.    . Multiple Vitamin (MULTIVITAMIN) tablet Take 4 tablets by mouth daily.     . NON FORMULARY Take 3 capsules by mouth 3 (three) times daily. Collatrim     No facility-administered medications prior to visit.     EXAM:  BP 150/86 mmHg  Temp(Src) 98.3 F (36.8 C) (Oral)  Ht 5\' 2"  (1.575 m)  Wt 199 lb 3.2 oz (90.357 kg)  BMI 36.43 kg/m2  Body mass index is 36.43 kg/(m^2).  GENERAL: vitals reviewed and listed above, alert, oriented, appears well hydrated and in no acute distress emotional at times but cognition seems intact  HEENT: atraumatic, conjunctiva  clear, no obvious abnormalities on inspection of external nose and ears OP : no lesion edema or exudate  Points to left angle of jaw area and occiput  As area of pain no obv abscess or infection NECK: no obvious masses on inspection palpation  CV: HRRR, no clubbing cyanosis or  peripheral edema nl cap refill  MS: moves all extremities without noticeable  focal  abnormality PSYCH: pleasant and cooperative, emotional at times  cognition attention intact  Lab Results  Component Value Date   WBC 13.0* 05/05/2015   HGB 12.6 05/05/2015   HCT 38.3 05/05/2015   PLT 204 05/05/2015   GLUCOSE 86 05/11/2015   CHOL 165 03/11/2015   TRIG 66.0 03/11/2015   HDL 67.30 03/11/2015   LDLCALC 85 03/11/2015   ALT 22 05/05/2015   AST 31 05/05/2015   NA 139 05/11/2015   K 4.0 05/11/2015   CL 104 05/11/2015   CREATININE 0.61 05/11/2015   BUN 12 05/11/2015   CO2 29 05/11/2015   TSH 3.29 03/11/2015   HGBA1C 5.5 05/11/2015    ASSESSMENT AND PLAN:  Discussed the following assessment and plan:  New daily persistent headache - acts like neuropathic pain  left  Elevated BP  Need for prophylactic vaccination and inoculation against influenza - Plan: Flu vaccine HIGH DOSE PF (Fluzone High dose)  NECK PAIN  Recent bereavement - disc   counseling  major losses and upheavals  Repeat bp was 150/80  Need to check to ensure at goal  Send in my chart if possible Ok for flu vaccine Total visit 66mins > 50% spent counseling and coordinating care as indicated in above note and in instructions to patient .  -Patient advised to return or notify health care team  if symptoms worsen ,persist or new concerns arise.  Patient Instructions  Take blood pressure readings twice a day for 7- 10 days and then periodically .To ensure below 140/90   .Send in readings   When resting .  As discussed .  Continue with  Headache neck work up.  New counseling situation.  See periodontal as planned .     Standley Brooking. Jaxtin Raimondo M.D.

## 2015-06-11 NOTE — Patient Instructions (Addendum)
Take blood pressure readings twice a day for 7- 10 days and then periodically .To ensure below 140/90   .Send in readings   When resting .  As discussed .  Continue with  Headache neck work up.  New counseling situation.  See periodontal as planned .

## 2015-06-12 DIAGNOSIS — M542 Cervicalgia: Secondary | ICD-10-CM | POA: Diagnosis not present

## 2015-06-15 ENCOUNTER — Ambulatory Visit
Admission: RE | Admit: 2015-06-15 | Discharge: 2015-06-15 | Disposition: A | Discharge status: Home / Self Care | Payer: Commercial Managed Care - HMO | Source: Ambulatory Visit | Attending: Neurology | Admitting: Neurology

## 2015-06-15 DIAGNOSIS — R51 Headache: Secondary | ICD-10-CM | POA: Diagnosis not present

## 2015-06-15 DIAGNOSIS — M542 Cervicalgia: Secondary | ICD-10-CM | POA: Diagnosis not present

## 2015-06-15 MED ORDER — GADOBENATE DIMEGLUMINE 529 MG/ML IV SOLN
20.0000 mL | Freq: Once | INTRAVENOUS | Status: AC | PRN
  Administered 2015-06-15: 18 mL via INTRAVENOUS

## 2015-06-16 DIAGNOSIS — M542 Cervicalgia: Secondary | ICD-10-CM | POA: Diagnosis not present

## 2015-06-17 ENCOUNTER — Other Ambulatory Visit: Payer: Commercial Managed Care - HMO

## 2015-06-18 ENCOUNTER — Ambulatory Visit: Payer: Commercial Managed Care - HMO | Admitting: Psychology

## 2015-06-19 DIAGNOSIS — M542 Cervicalgia: Secondary | ICD-10-CM | POA: Diagnosis not present

## 2015-06-24 DIAGNOSIS — M542 Cervicalgia: Secondary | ICD-10-CM | POA: Diagnosis not present

## 2015-06-29 DIAGNOSIS — M542 Cervicalgia: Secondary | ICD-10-CM | POA: Diagnosis not present

## 2015-07-03 DIAGNOSIS — M542 Cervicalgia: Secondary | ICD-10-CM | POA: Diagnosis not present

## 2015-07-04 ENCOUNTER — Other Ambulatory Visit: Payer: Self-pay | Admitting: Family Medicine

## 2015-07-06 DIAGNOSIS — M542 Cervicalgia: Secondary | ICD-10-CM | POA: Diagnosis not present

## 2015-07-06 NOTE — Telephone Encounter (Signed)
Refill done.  

## 2015-07-09 DIAGNOSIS — M542 Cervicalgia: Secondary | ICD-10-CM | POA: Diagnosis not present

## 2015-07-14 DIAGNOSIS — M542 Cervicalgia: Secondary | ICD-10-CM | POA: Diagnosis not present

## 2015-07-15 ENCOUNTER — Encounter: Payer: Self-pay | Admitting: Family Medicine

## 2015-07-15 ENCOUNTER — Ambulatory Visit (INDEPENDENT_AMBULATORY_CARE_PROVIDER_SITE_OTHER): Payer: Commercial Managed Care - HMO | Admitting: Family Medicine

## 2015-07-15 VITALS — BP 130/78 | HR 84 | Ht 62.0 in | Wt 201.0 lb

## 2015-07-15 DIAGNOSIS — M5030 Other cervical disc degeneration, unspecified cervical region: Secondary | ICD-10-CM

## 2015-07-15 DIAGNOSIS — M5010 Cervical disc disorder with radiculopathy, unspecified cervical region: Secondary | ICD-10-CM

## 2015-07-15 MED ORDER — TIZANIDINE HCL 4 MG PO TABS: 4.0000 mg | ORAL_TABLET | Freq: Three times a day (TID) | ORAL | Status: DC | PRN

## 2015-07-15 NOTE — Progress Notes (Signed)
Pre visit review using our clinic review tool, if applicable. No additional management support is needed unless otherwise documented below in the visit note. 

## 2015-07-15 NOTE — Progress Notes (Signed)
[facility] [address] [place], [place] [address] Phone: [phone] Subjective:    [place]: Neck pain follow-up  [id number] Ashley Moore is a 64 y.o. female coming in with complaint of neck pain. Patient does have moderate osteophytic changes of the neck at multiple levels. Patient's was treated for trigger point injections and was given home exercises. Patient was not able to tolerate muscle relaxers. Continues to take the gabapentin and meloxicam on a regular schedule. Patient states that she has to take the medications just continue to work appropriately. Patient states that she has missed the medicine she cannot do any activities. States that she does have chronic pain at all times. Has notices become more difficult to move her neck on a regular basis. Patient states that she has not notice any radiation of pain but may be more clumsy with her hands recently. No significant nighttime awakening. Worsening headaches. Patient did have x-rays  show that patient has  severe osteophytic changes in multiple levels of the cervical spine.  Past Medical History  Diagnosis Date  . Bowen's disease     excised 1992  . Hx of varicella   . Headache    Past Surgical History  Procedure Laterality Date  . Bunionectomy Right   . Skin cancer excision      bowens disease   Social History  Substance Use Topics  . Smoking status: Never Smoker   . Smokeless tobacco: Not on file  . Alcohol Use: 0.0 oz/week    0 Standard drinks or equivalent per week     Comment: occasional   Family History  Problem Relation Age of Onset  . Breast cancer      mom and MGM and sister   . Pancreatitis Father   . Cancer Mother     lung  . Cancer Sister     breast/melanoma  . Cancer Maternal Grandmother     breast   . Cancer Maternal Aunt     breast   Allergies  Allergen Reactions  . Benadryl [Diphenhydramine] Other (See Comments)    Tingle all over   . Melatonin    Tingle all over   . Penicillins Rash        Past medical history, social, surgical and family history all reviewed in electronic medical record.   Review of Systems: No headache, visual changes, nausea, vomiting, diarrhea, constipation, dizziness, abdominal pain, skin rash, fevers, chills, night sweats, weight loss, swollen lymph nodes, body aches, joint swelling, muscle aches, chest pain, shortness of breath, mood changes.   Objective There were no vitals taken for this visit.  General: No apparent distress alert and oriented x3 mood and affect normal, dressed appropriately.  HEENT: Pupils equal, extraocular movements intact  Respiratory: Patient's speak in full sentences and does not appear short of breath  Cardiovascular: No lower extremity edema, non tender, no erythema  Skin: Warm dry intact with no signs of infection or rash on extremities or on axial skeleton.  Abdomen: Soft nontender  Neuro: Cranial nerves II through XII are intact, neurovascularly intact in all extremities with 2+ DTRs and 2+ pulses.  Lymph: No lymphadenopathy of posterior or anterior cervical chain or axillae bilaterally.  Gait normal with good balance and coordination.  MSK:  Non tender with full range of motion and good stability and symmetric strength and tone of shoulders, elbows, wrist, hip, knee and ankles bilaterally.  Neck: Loss of lordosis No palpable stepoffs. Negative Spurling's maneuver. significant  decrease in range of motion lacking the last 15 of extension and now with only 5 of rotation bilaterally. No side bending to the right side. Worse than previous exam. Grip strength and sensation normal in bilateral hands Mild weakness with C7-T1 distribution laterally. New finding. No sensory change to C4 to T1 Negative Hoffman sign bilaterally Reflexes normal    Impression and Recommendations:     This case required medical decision making of moderate complexity.

## 2015-07-15 NOTE — Patient Instructions (Signed)
Good to see you We will get MRI  Continue the medications Ice when you need it.  I will give you the results with the MRI and tell you my recommendation Turmeric 500mg  twice daily  Meloxicam still is ok Try zanaflex instead of baclofen.

## 2015-07-15 NOTE — Assessment & Plan Note (Signed)
Patient is having intractable pain at this time. Seems to be worsening with pain decreasing range of motion. Patient continues to do all conservative therapy and states that the medications does keep the pain at [place]. I do think though the patient is having worsening symptoms as well as some very minimal weakness noted in the hands. I do feel advance imaging is warranted at this time. Patient will have an MRI of the cervical spine done. Depending on results we may need to consider epidural steroid injection versus further evaluation by neurosurgery. Depending on results we will discuss further treatment options.Spent  25 minutes with patient face-to-face and had greater than 50% of counseling including as described above in assessment and plan.

## 2015-07-17 DIAGNOSIS — M542 Cervicalgia: Secondary | ICD-10-CM | POA: Diagnosis not present

## 2015-07-20 DIAGNOSIS — M542 Cervicalgia: Secondary | ICD-10-CM | POA: Diagnosis not present

## 2015-07-22 DIAGNOSIS — M542 Cervicalgia: Secondary | ICD-10-CM | POA: Diagnosis not present

## 2015-07-30 DIAGNOSIS — M542 Cervicalgia: Secondary | ICD-10-CM | POA: Diagnosis not present

## 2015-08-02 ENCOUNTER — Other Ambulatory Visit: Payer: Self-pay | Admitting: Family Medicine

## 2015-08-03 NOTE — Telephone Encounter (Signed)
Refill done.  

## 2015-08-05 DIAGNOSIS — M542 Cervicalgia: Secondary | ICD-10-CM | POA: Diagnosis not present

## 2015-08-07 ENCOUNTER — Telehealth: Payer: Self-pay

## 2015-08-07 ENCOUNTER — Ambulatory Visit
Admission: RE | Admit: 2015-08-07 | Discharge: 2015-08-07 | Disposition: A | Discharge status: Home / Self Care | Payer: Commercial Managed Care - HMO | Source: Ambulatory Visit | Attending: Family Medicine | Admitting: Family Medicine

## 2015-08-07 DIAGNOSIS — M542 Cervicalgia: Secondary | ICD-10-CM | POA: Diagnosis not present

## 2015-08-07 DIAGNOSIS — M5010 Cervical disc disorder with radiculopathy, unspecified cervical region: Secondary | ICD-10-CM

## 2015-08-07 DIAGNOSIS — M4802 Spinal stenosis, cervical region: Secondary | ICD-10-CM | POA: Diagnosis not present

## 2015-08-07 DIAGNOSIS — M2578 Osteophyte, vertebrae: Secondary | ICD-10-CM | POA: Insufficient documentation

## 2015-08-07 DIAGNOSIS — M50323 Other cervical disc degeneration at C6-C7 level: Secondary | ICD-10-CM | POA: Insufficient documentation

## 2015-08-07 DIAGNOSIS — Z9181 History of falling: Secondary | ICD-10-CM

## 2015-08-07 NOTE — Telephone Encounter (Signed)
Order sign. Sent message to patient

## 2015-08-07 NOTE — Telephone Encounter (Signed)
Dr. Lars Pinks called from radiology:   Pt MRI findings show 1 of 2 things:  1. The abnormality is degenerative 2. The abnormality is actually a subacute cervical spine fracture from her fall in February and has suboptimally healed.   Dr. Nevada Crane is suggesting a CT scan wo contrast.   Order for CT is pended. Per [place] the order is: [id number] Dx codes have been added but can be changed if needed.  Let me know if I can provide any additional assistance.

## 2015-08-11 NOTE — Telephone Encounter (Signed)
Pt is wondering if she should still go in for physical therapy after hearing the results of the MRI

## 2015-08-21 ENCOUNTER — Ambulatory Visit
Admission: RE | Admit: 2015-08-21 | Discharge: 2015-08-21 | Disposition: A | Discharge status: Home / Self Care | Payer: Commercial Managed Care - HMO | Source: Ambulatory Visit | Attending: Family Medicine | Admitting: Family Medicine

## 2015-08-21 DIAGNOSIS — M542 Cervicalgia: Secondary | ICD-10-CM

## 2015-08-21 DIAGNOSIS — M5010 Cervical disc disorder with radiculopathy, unspecified cervical region: Secondary | ICD-10-CM

## 2015-08-21 DIAGNOSIS — M4322 Fusion of spine, cervical region: Secondary | ICD-10-CM | POA: Diagnosis not present

## 2015-08-21 DIAGNOSIS — Z9181 History of falling: Secondary | ICD-10-CM

## 2015-08-31 ENCOUNTER — Other Ambulatory Visit: Payer: Self-pay

## 2015-08-31 DIAGNOSIS — Z1231 Encounter for screening mammogram for malignant neoplasm of breast: Secondary | ICD-10-CM

## 2015-09-08 ENCOUNTER — Ambulatory Visit: Payer: Self-pay | Admitting: Neurology

## 2015-09-22 ENCOUNTER — Ambulatory Visit (INDEPENDENT_AMBULATORY_CARE_PROVIDER_SITE_OTHER): Payer: Commercial Managed Care - HMO | Admitting: Neurology

## 2015-09-22 ENCOUNTER — Encounter: Payer: Self-pay | Admitting: Neurology

## 2015-09-22 VITALS — BP 118/76 | HR 95 | Ht 62.0 in | Wt 200.0 lb

## 2015-09-22 DIAGNOSIS — G4486 Cervicogenic headache: Secondary | ICD-10-CM

## 2015-09-22 DIAGNOSIS — M5090 Cervical disc disorder, unspecified, unspecified cervical region: Secondary | ICD-10-CM

## 2015-09-22 DIAGNOSIS — R51 Headache: Secondary | ICD-10-CM | POA: Diagnosis not present

## 2015-09-22 NOTE — Patient Instructions (Addendum)
Continue gabapentin, meloxicam and tizanidine Continue exercises as tolerated Follow up in 5 months

## 2015-09-22 NOTE — Progress Notes (Signed)
NEUROLOGY FOLLOW UP OFFICE NOTE  Ashley Moore [id number]  HISTORY OF PRESENT ILLNESS: Ashley Moore is a 65 year old right-handed woman with degenerative arthritis of the cervical spine who follows up for cervicogenic headache.  Recent history obtained by patient and Dr. Thompson Caul notes.  Imaging of brain and cervical MRI reviewed.  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  She received trigger point injections and home exercises.   She is taking gabapentin 100mg  in the morning and 200mg  at night, meloxicam 15mg  as needed, as well as tizanidine as needed.  This regimen seems to work well for her.  MRI of the brain with and without contrast from 06/15/15 showed no acute intracranial process, but did reveal degenerative changes at left C1-2.  MRI of cervical spine from 08/07/15 revealed showed moderate bilateral neural foraminal stenosis at C5, C6 and C7 nerve levels.  There was also degenerative left C1-C2 articulation with acute marrow edema and involving the central C2 odontoid.  Follow up CT of cervical spine verified extensive C1-2 degenerative changes.  HISTORY: On 10/28/14, her husband fell.  When she tried to get him up, she developed this severe pain in the back of her head.  She didn't notice pulling her neck.  She has had a constant occipital headache since then.  It is located on the left side.  When she moves her neck or head slightly in any direction, she feels a burning shooting pain up to the front of the head and down the left side of her neck to the shoulder.  She denies any numbness or tingling in the back of the head or in the extremities.  She denies visual symptoms, nausea, photophobia, phonophobia or dizziness.  Besides head movement, stress also exacerbates it.  Applying pressure to the area seems to help.  She has suffered the loss of both her husband and sister in the past several weeks.  More recently, she notes cracking in her neck when she moves.  Since the occurrence,  she has been taking Tylenol daily.  She also tried heating pad and ice, which are not too effective.  Massage only briefly helps for about 10 minutes.  PAST MEDICAL HISTORY: Past Medical History  Diagnosis Date  . Bowen's disease     excised 1992  . Hx of varicella   . Headache     MEDICATIONS: Current Outpatient Prescriptions on File Prior to Visit  Medication Sig Dispense Refill  . aspirin 81 MG tablet Take 81 mg by mouth daily.    . Calcium-Magnesium-Vitamin D (CALCIUM MAGNESIUM PO) Take 2 tablets by mouth daily.    Marland Kitchen gabapentin (NEURONTIN) 100 MG capsule TAKE 3 CAPSULES (300 MG TOTAL) BY MOUTH AT BEDTIME. 90 capsule 3  . GINSENG KOREAN PO Take by mouth.    . meloxicam (MOBIC) 15 MG tablet TAKE 1 TABLET (15 MG TOTAL) BY MOUTH DAILY. 90 tablet 0  . Multiple Vitamin (MULTIVITAMIN) tablet Take 4 tablets by mouth daily.     . NON FORMULARY Take 3 capsules by mouth 3 (three) times daily. Collatrim    . tiZANidine (ZANAFLEX) 4 MG tablet Take 1 tablet (4 mg total) by mouth every 8 (eight) hours as needed for muscle spasms. 90 tablet 2   No current facility-administered medications on file prior to visit.    ALLERGIES: Allergies  Allergen Reactions  . Benadryl [Diphenhydramine] Other (See Comments)    Tingle all over   . Melatonin     Tingle all over   .  Penicillins Rash    FAMILY HISTORY: Family History  Problem Relation Age of Onset  . Breast cancer      mom and MGM and sister   . Pancreatitis Father   . Cancer Mother     lung  . Cancer Sister     breast/melanoma  . Cancer Maternal Grandmother     breast   . Cancer Maternal Aunt     breast    SOCIAL HISTORY: Social History   Social History  . Marital Status: Married    Spouse Name: N/A  . Number of Children: N/A  . Years of Education: N/A   Occupational History  . Not on file.   Social History Main Topics  . Smoking status: Never Smoker   . Smokeless tobacco: Not on file  . Alcohol Use: 0.0 oz/week     0 Standard drinks or equivalent per week     Comment: occasional  . Drug Use: Yes    Special: Marijuana  . Sexual Activity: No   Other Topics Concern  . Not on file   Social History Narrative   H H  of 1      5 pets.   She is a former smoker   Retired Engineer, mining fine arts   Husband smokes he has active heart disease and vascular disease   etoh   Red wine  1 per night.    Tea green tea and earl gray    [place]oved from [place] to [place] area in 01-14-84   1 pregnancy   Husband died spring  2016 cv   Sister died 03-16-15  Bone cancer                 REVIEW OF SYSTEMS: Constitutional: No fevers, chills, or sweats, no generalized fatigue, change in appetite Eyes: No visual changes, double vision, eye pain Ear, nose and throat: No hearing loss, ear pain, nasal congestion, sore throat Cardiovascular: No chest pain, palpitations Respiratory:  No shortness of breath at rest or with exertion, wheezes GastrointestinaI: No nausea, vomiting, diarrhea, abdominal pain, fecal incontinence Genitourinary:  No dysuria, urinary retention or frequency Musculoskeletal:  No neck pain, back pain Integumentary: No rash, pruritus, skin lesions Neurological: as above Psychiatric: No depression, insomnia, anxiety Endocrine: No palpitations, fatigue, diaphoresis, mood swings, change in appetite, change in weight, increased thirst Hematologic/Lymphatic:  No anemia, purpura, petechiae. Allergic/Immunologic: no itchy/runny eyes, nasal congestion, recent allergic reactions, rashes  PHYSICAL EXAM: Filed Vitals:   09/22/15 0755  BP: 118/76  Pulse: 95   General: No acute distress.  Patient appears well-groomed.  normal body habitus. Head:  Normocephalic/atraumatic Eyes:  Fundoscopic exam unremarkable without vessel changes, exudates, hemorrhages or papilledema. Neck: supple, no paraspinal tenderness, full range of motion Heart:  Regular rate and rhythm Lungs:  Clear to auscultation  bilaterally Back: No paraspinal tenderness Neurological Exam: alert and oriented to person, place, and time. Attention span and concentration intact, recent and remote memory intact, fund of knowledge intact.  Speech fluent and not dysarthric, language intact.  CN II-XII intact. Fundoscopic exam unremarkable without vessel changes, exudates, hemorrhages or papilledema.  Bulk and tone normal, muscle strength 5/5 throughout.  Sensation to light touch intact.  Deep tendon reflexes 2+ throughout.  Finger to nose and heel to shin testing intact.  Gait normal  IMPRESSION: Cervicogenic headache Cervical disc disease  PLAN: Continue gabapentin 100mg  in AM and 200mg  at night Meloxicam and tizinadine as needed Home exercises Follow up in 4  to 5 months.  Metta Clines, DO  CC:  Shanon Ace, MD

## 2015-10-01 ENCOUNTER — Ambulatory Visit: Payer: Self-pay

## 2015-10-06 ENCOUNTER — Ambulatory Visit
Admission: RE | Admit: 2015-10-06 | Discharge: 2015-10-06 | Disposition: A | Discharge status: Home / Self Care | Payer: Commercial Managed Care - HMO | Source: Ambulatory Visit

## 2015-10-06 DIAGNOSIS — Z1231 Encounter for screening mammogram for malignant neoplasm of breast: Secondary | ICD-10-CM | POA: Diagnosis not present

## 2015-10-17 ENCOUNTER — Other Ambulatory Visit: Payer: Self-pay | Admitting: Family Medicine

## 2015-10-19 NOTE — Telephone Encounter (Signed)
Refill done.  

## 2015-10-21 ENCOUNTER — Ambulatory Visit (INDEPENDENT_AMBULATORY_CARE_PROVIDER_SITE_OTHER): Payer: Commercial Managed Care - HMO | Admitting: Internal Medicine

## 2015-10-21 ENCOUNTER — Encounter: Payer: Self-pay | Admitting: Internal Medicine

## 2015-10-21 ENCOUNTER — Encounter: Payer: Self-pay | Admitting: Neurology

## 2015-10-21 VITALS — BP 124/70 | Temp 97.8°F | Ht 62.0 in | Wt 204.2 lb

## 2015-10-21 DIAGNOSIS — M5030 Other cervical disc degeneration, unspecified cervical region: Secondary | ICD-10-CM | POA: Diagnosis not present

## 2015-10-21 DIAGNOSIS — E041 Nontoxic single thyroid nodule: Secondary | ICD-10-CM

## 2015-10-21 LAB — TSH: TSH: 4.09 u[IU]/mL (ref 0.35–4.50)

## 2015-10-21 LAB — T4, FREE: FREE T4: 0.77 ng/dL (ref 0.60–1.60)

## 2015-10-21 NOTE — Progress Notes (Signed)
Pre visit review using our clinic review tool, if applicable. No additional management support is needed unless otherwise documented below in the visit note.  Chief Complaint  Patient presents with  . Neck Pain and Lumps    HPI: Patient Ashley Moore  comes in today for SDA for  new problem evaluation. Under eval and rx for neck pain  And noted to have  Incidental findings thyroid nodules . Neck pain she says worse with stress and in a bad bout currently  Mom and sis had thyroid disease no known cancer .  ROS: See pertinent positives and negatives per HPI.  Past Medical History  Diagnosis Date  . Bowen's disease     excised 1992  . Hx of varicella   . Headache     Family History  Problem Relation Age of Onset  . Breast cancer      mom and MGM and sister   . Pancreatitis Father   . Cancer Mother     lung  . Cancer Sister     breast/melanoma  . Cancer Maternal Grandmother     breast   . Cancer Maternal Aunt     breast    Social History   Social History  . Marital Status: Married    Spouse Name: N/A  . Number of Children: N/A  . Years of Education: N/A   Social History Main Topics  . Smoking status: Never Smoker   . Smokeless tobacco: None  . Alcohol Use: 0.0 oz/week    0 Standard drinks or equivalent per week     Comment: occasional  . Drug Use: Yes    Special: Marijuana  . Sexual Activity: No   Other Topics Concern  . None   Social History Narrative   H H  of 1      5 pets.   She is a former smoker   Retired Engineer, mining fine [employer]   Husband smokes he has active heart disease and vascular disease   etoh   Red wine  1 per night.    Tea green tea and earl gray    Moved from [place] to [place] area in 08-Jan[place]1984   1 pregnancy   Husband died spring  2016 cv   Sister died 03-10-2015  Bone cancer                 Outpatient Prescriptions Prior to Visit  Medication Sig Dispense Refill  . aspirin 81 MG tablet Take 81 mg by mouth  daily.    . Calcium-Magnesium-Vitamin D (CALCIUM MAGNESIUM PO) Take 2 tablets by mouth daily.    Marland Kitchen gabapentin (NEURONTIN) 100 MG capsule TAKE 3 CAPSULES (300 MG TOTAL) BY MOUTH AT BEDTIME. 90 capsule 3  . GINSENG KOREAN PO Take by mouth.    . meloxicam (MOBIC) 15 MG tablet TAKE 1 TABLET (15 MG TOTAL) BY MOUTH DAILY. 90 tablet 0  . Multiple Vitamin (MULTIVITAMIN) tablet Take 4 tablets by mouth daily.     . NON FORMULARY Take 3 capsules by mouth 3 (three) times daily. Collatrim    . tiZANidine (ZANAFLEX) 4 MG tablet TAKE 1 TABLET (4 MG TOTAL) BY MOUTH EVERY 8 (EIGHT) HOURS AS NEEDED FOR MUSCLE SPASMS. 90 tablet 0   No facility-administered medications prior to visit.     EXAM:  BP 124/70 mmHg  Temp(Src) 97.8 F (36.6 C) (Oral)  Ht 5\' 2"  (1.575 m)  Wt 204 lb 3.2 oz (92.625 kg)  BMI  37.34 kg/m2  Body mass index is 37.34 kg/(m^2).  GENERAL: vitals reviewed and listed above, alert, oriented, appears well hydrated and in no acute distress  Neck held stiffly  Pain left scm areas  HEENT: atraumatic, conjunctiva  clear, no obvious abnormalities on inspection of external nose and ears OP : no lesion edema or exudate  NECK:  Thyroid palpable no  Lump or adenopathy  HR rr  MS: moves all extremities without noticeable focal  abnormality PSYCH: pleasant and cooperative, no obvious depression or anxiety IMPRESSION: 1. 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 2. No fracture seen. 3. Additional multilevel cervical spine degenerative changes, as described in the previous MR report. 4. Multiple thyroid nodules. Consider further evaluation with thyroid ultrasound. If patient is clinically hyperthyroid, consider nuclear medicine thyroid uptake and scan. 5. Mild changes of COPD.   Electronically Signed  By: Claudie Revering M.D.  On: 08/21/2015 16:14  ASSESSMENT AND  PLAN:  Discussed the following assessment and plan:  Thyroid nodules - incidental on  ct mri of neck has significant djd disease of c spine. fam hx of thyroid disease neg for th cancer  - Plan: TSH, T4, free, US Soft Tissue Head/Neck  Degenerative cervical disc intermittnet ha cervicogenic she says triggered stress  . But has sig djd c spine   Disc   Expectant management.  thryoid  Issues   Not contrib to her sx today  -Patient advised to return or notify health care team  if symptoms worsen ,persist or new concerns arise. Total visit 60mins > 50% spent counseling and coordinating care as indicated in above note and in instructions to patient .   Patient Instructions  Will have you get ultrasound of  Neck to assess and go from there . Some one will call you about this.   Thyroid Nodule A thyroid nodule is an isolatedgrowth of thyroid cells that forms a lump in your thyroid gland. The thyroid gland is a butterfly-shaped gland. It is found in the lower front of your neck. This gland sends chemical messengers (hormones) through your blood to all parts of your body. These hormones are important in regulating your body temperature and helping your body to use energy. Thyroid nodules are common. Most are not cancerous (are benign). You may have one nodule or several nodules.  Different types of thyroid nodules include:  Nodules that grow and fill with fluid (thyroid cysts).  Nodules that produce too much thyroid hormone (hot nodules or hyperthyroid).  Nodules that produce no thyroid hormone (cold nodules or hypothyroid).  Nodules that form from cancer cells (thyroid cancers). CAUSES Usually, the cause of this condition is not known. RISK FACTORS Factors that make this condition more likely to develop include:  Increasing age. Thyroid nodules become more common in people who are older than 67 years of age.  Gender.  Benign thyroid nodules are more common in women.  Cancerous  (malignant) thyroid nodules are more common in men.  A family history that includes:  Thyroid nodules.  Pheochromocytoma.  Thyroid carcinoma.  Hyperparathyroidism.  Certain kinds of thyroid diseases, such as Hashimoto thyroiditis.  Lack of iodine.  A history of head and neck radiation, such as from X-rays. SYMPTOMS It is common for this condition to cause no symptoms. If you have symptoms, they may include:  A lump in your lower neck.  Feeling a lump or tickle in your  throat.  Pain in your neck, jaw, or ear.  Having trouble swallowing. Hot nodules may cause symptoms that include:  Weight loss.  Warm, flushed skin.  Feeling hot.  Feeling nervous.  A racing heartbeat. Cold nodules may cause symptoms that include:  Weight gain.  Dry skin.  Brittle hair. This may also occur with hair loss.  Feeling cold.  Fatigue. Thyroid cancer nodules may cause symptoms that include:  Hard nodules that feel stuck to the thyroid gland.  Hoarseness.  Lumps in the glands near your thyroid (lymph nodes). DIAGNOSIS A thyroid nodule may be felt by your health care provider during a physical exam. This condition may also be diagnosed based on your symptoms. You may also have tests, including:  An ultrasound. This may be done to confirm the diagnosis.  A biopsy. This involves taking a sample from the nodule and looking at it under a microscope to see if the nodule is benign.  Blood tests to make sure that your thyroid is working properly.  Imaging tests such as MRI or CT scan may be done if:  Your nodule is large.  Your nodule is blocking your airway.  Cancer is suspected. TREATMENT Treatment depends on the cause and size of your nodule or nodules. If the nodule is benign, treatment may not be necessary. Your health care provider may monitor the nodule to see if it goes away without treatment. If the nodule continues to grow, is cancerous, or does not go away:  It may  need to be drained with a needle.  It may need to be removed with surgery. If you have surgery, part or all of your thyroid gland may need to be removed as well. HOME CARE INSTRUCTIONS  Pay attention to any changes in your nodule.  Take over-the-counter and prescription medicines only as told by your health care provider.  Keep all follow-up visits as told by your health care provider. This is important. SEEK MEDICAL CARE IF:  Your voice changes.  You have trouble swallowing.  You have pain in your neck, ear, or jaw that is getting worse.  Your nodule gets bigger.  Your nodule starts to make it harder for you to breathe. SEEK IMMEDIATE MEDICAL CARE IF:  You have a sudden fever.  You feel very weak.  Your muscles look like they are shrinking (muscle wasting).  You have mood swings.  You feel very restless.  You feel confused.  You are seeing or hearing things that other people do not see or hear (having hallucinations).  You feel suddenly nauseous or throw up.  You suddenly have diarrhea.  You have chest pain.  There is a loss of consciousness.   This information is not intended to replace advice given to you by your health care provider. Make sure you discuss any questions you have with your health care provider.   Document Released: 07/29/2004 Document Revised: 05/27/2015 Document Reviewed: 12/17/2014 Elsevier Interactive Patient Education 2016 [place] K. Panosh M.D.

## 2015-10-21 NOTE — Patient Instructions (Addendum)
Will have you get ultrasound of  Neck to assess and go from there . Some one will call you about this.   Thyroid Nodule A thyroid nodule is an isolatedgrowth of thyroid cells that forms a lump in your thyroid gland. The thyroid gland is a butterfly-shaped gland. It is found in the lower front of your neck. This gland sends chemical messengers (hormones) through your blood to all parts of your body. These hormones are important in regulating your body temperature and helping your body to use energy. Thyroid nodules are common. Most are not cancerous (are benign). You may have one nodule or several nodules.  Different types of thyroid nodules include:  Nodules that grow and fill with fluid (thyroid cysts).  Nodules that produce too much thyroid hormone (hot nodules or hyperthyroid).  Nodules that produce no thyroid hormone (cold nodules or hypothyroid).  Nodules that form from cancer cells (thyroid cancers). CAUSES Usually, the cause of this condition is not known. RISK FACTORS Factors that make this condition more likely to develop include:  Increasing age. Thyroid nodules become more common in people who are older than 67 years of age.  Gender.  Benign thyroid nodules are more common in women.  Cancerous (malignant) thyroid nodules are more common in men.  A family history that includes:  Thyroid nodules.  Pheochromocytoma.  Thyroid carcinoma.  Hyperparathyroidism.  Certain kinds of thyroid diseases, such as Hashimoto thyroiditis.  Lack of iodine.  A history of head and neck radiation, such as from X-rays. SYMPTOMS It is common for this condition to cause no symptoms. If you have symptoms, they may include:  A lump in your lower neck.  Feeling a lump or tickle in your throat.  Pain in your neck, jaw, or ear.  Having trouble swallowing. Hot nodules may cause symptoms that include:  Weight loss.  Warm, flushed skin.  Feeling hot.  Feeling nervous.  A  racing heartbeat. Cold nodules may cause symptoms that include:  Weight gain.  Dry skin.  Brittle hair. This may also occur with hair loss.  Feeling cold.  Fatigue. Thyroid cancer nodules may cause symptoms that include:  Hard nodules that feel stuck to the thyroid gland.  Hoarseness.  Lumps in the glands near your thyroid (lymph nodes). DIAGNOSIS A thyroid nodule may be felt by your health care provider during a physical exam. This condition may also be diagnosed based on your symptoms. You may also have tests, including:  An ultrasound. This may be done to confirm the diagnosis.  A biopsy. This involves taking a sample from the nodule and looking at it under a microscope to see if the nodule is benign.  Blood tests to make sure that your thyroid is working properly.  Imaging tests such as MRI or CT scan may be done if:  Your nodule is large.  Your nodule is blocking your airway.  Cancer is suspected. TREATMENT Treatment depends on the cause and size of your nodule or nodules. If the nodule is benign, treatment may not be necessary. Your health care provider may monitor the nodule to see if it goes away without treatment. If the nodule continues to grow, is cancerous, or does not go away:  It may need to be drained with a needle.  It may need to be removed with surgery. If you have surgery, part or all of your thyroid gland may need to be removed as well. HOME CARE INSTRUCTIONS  Pay attention to any changes in your nodule.  Take over-the-counter and prescription medicines only as told by your health care provider.  Keep all follow-up visits as told by your health care provider. This is important. SEEK MEDICAL CARE IF:  Your voice changes.  You have trouble swallowing.  You have pain in your neck, ear, or jaw that is getting worse.  Your nodule gets bigger.  Your nodule starts to make it harder for you to breathe. SEEK IMMEDIATE MEDICAL CARE IF:  You  have a sudden fever.  You feel very weak.  Your muscles look like they are shrinking (muscle wasting).  You have mood swings.  You feel very restless.  You feel confused.  You are seeing or hearing things that other people do not see or hear (having hallucinations).  You feel suddenly nauseous or throw up.  You suddenly have diarrhea.  You have chest pain.  There is a loss of consciousness.   This information is not intended to replace advice given to you by your health care provider. Make sure you discuss any questions you have with your health care provider.   Document Released: 07/29/2004 Document Revised: 05/27/2015 Document Reviewed: 12/17/2014 Elsevier Interactive Patient Education [employer].

## 2015-10-22 ENCOUNTER — Other Ambulatory Visit: Payer: Self-pay | Admitting: Neurology

## 2015-10-23 NOTE — Telephone Encounter (Signed)
Last OV: 10/17/15 Next OV: 02/22/16

## 2015-10-26 ENCOUNTER — Ambulatory Visit
Admission: RE | Admit: 2015-10-26 | Discharge: 2015-10-26 | Disposition: A | Discharge status: Home / Self Care | Payer: Commercial Managed Care - HMO | Source: Ambulatory Visit | Attending: Internal Medicine | Admitting: Internal Medicine

## 2015-10-26 DIAGNOSIS — E041 Nontoxic single thyroid nodule: Secondary | ICD-10-CM

## 2015-10-26 DIAGNOSIS — E042 Nontoxic multinodular goiter: Secondary | ICD-10-CM | POA: Diagnosis not present

## 2015-11-22 ENCOUNTER — Other Ambulatory Visit: Payer: Self-pay | Admitting: Family Medicine

## 2015-11-23 NOTE — Telephone Encounter (Signed)
Pre visit review using our clinic review tool, if applicable. No additional management support is needed unless otherwise documented below in the visit note. 

## 2016-01-11 ENCOUNTER — Other Ambulatory Visit: Payer: Self-pay | Admitting: Family Medicine

## 2016-01-11 NOTE — Telephone Encounter (Signed)
Refill done.  

## 2016-01-12 ENCOUNTER — Other Ambulatory Visit: Payer: Self-pay

## 2016-01-12 MED ORDER — MELOXICAM 15 MG PO TABS: 15.0000 mg | ORAL_TABLET | ORAL | Status: DC | PRN

## 2016-01-18 ENCOUNTER — Other Ambulatory Visit: Payer: Self-pay | Admitting: Neurology

## 2016-01-28 ENCOUNTER — Other Ambulatory Visit: Payer: Self-pay

## 2016-01-28 MED ORDER — GABAPENTIN 100 MG PO CAPS: 100.0000 mg | ORAL_CAPSULE | Freq: Three times a day (TID) | ORAL | Status: DC

## 2016-01-28 MED ORDER — MELOXICAM 15 MG PO TABS: 15.0000 mg | ORAL_TABLET | ORAL | Status: DC | PRN

## 2016-01-28 MED ORDER — TIZANIDINE HCL 4 MG PO TABS: 4.0000 mg | ORAL_TABLET | Freq: Three times a day (TID) | ORAL | Status: DC | PRN

## 2016-01-28 NOTE — Telephone Encounter (Signed)
Pt switching to [facility] mail order pharmacy.

## 2016-02-03 ENCOUNTER — Other Ambulatory Visit: Payer: Self-pay

## 2016-02-03 ENCOUNTER — Telehealth: Payer: Self-pay | Admitting: Neurology

## 2016-02-03 MED ORDER — MELOXICAM 15 MG PO TABS: 15.0000 mg | ORAL_TABLET | Freq: Every day | ORAL | Status: DC

## 2016-02-03 NOTE — Telephone Encounter (Signed)
[employer] left a message in regards to PT and would like a call back at [phone]-REF#[id number]/Dawn

## 2016-02-03 NOTE — Telephone Encounter (Signed)
Called and spoke to [facility], just needed to verify a sig.

## 2016-02-17 ENCOUNTER — Telehealth: Payer: Self-pay | Admitting: Internal Medicine

## 2016-02-17 NOTE — Telephone Encounter (Signed)
[facility] - Client  [facility]     Patient Name: [name] Client [facility] [facility] - Client    Client Site [facility] - Day    Physician Shanon Ace - MD    Contact Type Call    Who Is Calling Patient / Member / Family / Caregiver    Call Type Triage / Clinical    Relationship To Patient Self    Return Phone Number [phone] (Primary)    Chief Complaint Rectal Bleeding  Gender: Female Reason for Call Symptomatic / Request for Health Information  DOB: 04-12-1951  Initial Comment Caller states have hemorrhoids that started bleeding today and is going through a stressful time in her life.   [age]: 9 Y 10 M 25 D Appointment Disposition EMR Appointment Scheduled  Return Phone Number: [phone] (Primary) Info pasted into Epic Yes  Address:  PreDisposition Call Doctor  City/State/Zip: [place]  Translation No    Nurse Assessment  Nurse: Amalia Hailey, RN, Melissa Date/Time (Eastern Time): 02/17/2016 3:09:44 PM  Confirm and document reason for call. If symptomatic, describe symptoms. You must click the next button to save text entered. ---Caller states have hemorrhoids that started bleeding today and is going through a stressful time in her life.  Has the patient traveled out of the country within the last 30 days? ---Not Applicable  Does the patient have any new or worsening symptoms? ---Yes  Will a triage be completed? ---Yes  Related visit to physician within the last 2 weeks? ---No  Does the PT have any chronic conditions? (i.e. diabetes, asthma, etc.) ---Yes  List chronic conditions. 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 ---No    Guidelines      Guideline Title Affirmed Question Affirmed Notes Nurse Date/Time (Eastern Time)  Rectal Bleeding MODERATE rectal bleeding  (small blood clots, passing blood without stool, or toilet water turns red)  Amalia Hailey, RN, Melissa 02/17/2016 3:14:51 PM  Disp. Time Eilene Ghazi Time) Disposition Final User         02/17/2016 3:17:52 PM See Physician within 24 Hours Yes Amalia Hailey, RN, Lenna Sciara         Caller Understands: Yes   Disagree/Comply: Comply      Care Advice Given Per Guideline         SEE PHYSICIAN WITHIN 24 HOURS: CALL BACK IF: * You become worse. * Bleeding increases * Dizziness occurs             Referrals   REFERRED TO PCP OFFICE

## 2016-02-17 NOTE — Telephone Encounter (Signed)
Appt scheduled for 02/18/16

## 2016-02-18 ENCOUNTER — Encounter: Payer: Self-pay | Admitting: Internal Medicine

## 2016-02-18 ENCOUNTER — Ambulatory Visit (INDEPENDENT_AMBULATORY_CARE_PROVIDER_SITE_OTHER): Payer: Commercial Managed Care - HMO | Admitting: Internal Medicine

## 2016-02-18 VITALS — BP 120/80 | HR 73 | Temp 98.9°F | Ht 62.0 in | Wt 201.0 lb

## 2016-02-18 DIAGNOSIS — K625 Hemorrhage of anus and rectum: Secondary | ICD-10-CM

## 2016-02-18 DIAGNOSIS — K649 Unspecified hemorrhoids: Secondary | ICD-10-CM

## 2016-02-18 LAB — POCT HEMOGLOBIN: Hemoglobin: 12.5 g/dL (ref 12.2–16.2)

## 2016-02-18 NOTE — Progress Notes (Signed)
Chief Complaint  Patient presents with  . Hemorrhoids    pt was bleeding from her hemorroids yesterday, pt feels that iit was due to drinking too much vinegar     HPI: Ashley Moore 65 y.o. sent in by team health  For "hemorrhoidal bleeding"  Stopped   All supplements  X asa and multivits and  Then trying vinegar for joints   Began working out and then yesterday  Had to go to the dentist  And had  Rectal bleeding    Even at the   Dentist .  Stopped bleeding   When came relaxed .  Vinegar .  No hx of same .   Sticking out like a period  Bleeding without clotting previous  Using 3 weeks . Itchy tender . Off a month  No real pain  But   If has sto strain somewhat  ROS: See pertinent positives and negatives per HPI. No bruising or bleeding   Past Medical History  Diagnosis Date  . Bowen's disease     excised 1992  . Hx of varicella   . Headache     Family History  Problem Relation Age of Onset  . Breast cancer      mom and MGM and sister   . Pancreatitis Father   . Cancer Mother     lung  . Cancer Sister     breast/melanoma  . Cancer Maternal Grandmother     breast   . Cancer Maternal Aunt     breast    Social History   Social History  . Marital Status: Married    Spouse Name: N/A  . Number of Children: N/A  . Years of Education: N/A   Social History Main Topics  . Smoking status: Never Smoker   . Smokeless tobacco: None  . Alcohol Use: 0.0 oz/week    0 Standard drinks or equivalent per week     Comment: occasional  . Drug Use: Yes    Special: Marijuana  . Sexual Activity: No   Other Topics Concern  . None   Social History Narrative   H H  of 1      5 pets.   She is a former smoker   Retired Engineer, mining fine arts   Husband smokes he has active heart disease and vascular disease   etoh   Red wine  1 per night.    Tea green tea and earl gray    [place]oved from [place] to [place] area in 01/05/84   1 pregnancy   Husband died spring   2016 cv   Sister died 03-07-2015  Bone cancer                 Outpatient Prescriptions Prior to Visit  Medication Sig Dispense Refill  . aspirin 81 MG tablet Take 81 mg by mouth daily.    Marland Kitchen gabapentin (NEURONTIN) 100 MG capsule Take 1 capsule (100 mg total) by mouth 3 (three) times daily. 270 capsule 0  . meloxicam (MOBIC) 15 MG tablet Take 1 tablet (15 mg total) by mouth daily. 90 tablet 0  . Multiple Vitamin (MULTIVITAMIN) tablet Take 1 tablet by mouth daily.     Marland Kitchen tiZANidine (ZANAFLEX) 4 MG tablet Take 1 tablet (4 mg total) by mouth every 8 (eight) hours as needed for muscle spasms. 90 tablet 0  . Calcium-Magnesium-Vitamin D (CALCIUM MAGNESIUM PO) Take 2 tablets by mouth daily.    Marland Kitchen [place]  PO Take by mouth.    . NON FORMULARY Take 3 capsules by mouth 3 (three) times daily. Collatrim    . TURMERIC PO Take 1,200 mg by mouth daily.     No facility-administered medications prior to visit.     EXAM:  BP 120/80 mmHg  Pulse 73  Temp(Src) 98.9 F (37.2 C) (Oral)  Ht 5\' 2"  (1.575 m)  Wt 201 lb (91.173 kg)  BMI 36.75 kg/m2  SpO2 99%  Body mass index is 36.75 kg/(m^2).  GENERAL: vitals reviewed and listed above, alert, oriented, appears well hydrated and in no acute distress HEENT: atraumatic, conjunctiva  clear, no obvious abnormalities on inspection of external nose and ears CV: HRRR, no clubbing cyanosis or  peripheral edema nl cap refill  Rectal  area shows large very firm round smooth  Lump about1.8 -  2 cm one areas ooze blood when palpated non tender  . abd soft  MS: moves all extremities without noticeable focal  abnormality PSYCH: pleasant and cooperative, no obvious depression or anxiety Lab Results  Component Value Date   WBC 13.0* 05/05/2015   HGB 12.5 02/18/2016   HCT 38.3 05/05/2015   PLT 204 05/05/2015   GLUCOSE 86 05/11/2015   CHOL 165 03/11/2015   TRIG 66.0 03/11/2015   HDL 67.30 03/11/2015   LDLCALC 85 03/11/2015   ALT 22 05/05/2015   AST 31  05/05/2015   NA 139 05/11/2015   K 4.0 05/11/2015   CL 104 05/11/2015   CREATININE 0.61 05/11/2015   BUN 12 05/11/2015   CO2 29 05/11/2015   TSH 4.09 10/21/2015   HGBA1C 5.5 05/11/2015   ENDOSCOPIC IMPRESSION: from colonoscopy 2012  1) 3 mm sessile polyp in the sigmoid colon  2) Mild diverticulosis in the sigmoid colon  3) Melanosis  4) Otherwise normal examination  RECOMMENDATIONS:  1) If the polyp(s) removed today are proven to be adenomatous  (pre-cancerous) polyps, you will need a repeat colonoscopy in 5  years. Otherwise you should continue to follow colorectal cancer  screening guidelines for "routine risk" patients with colonoscopy  in 10 years.    REPEAT EXAM: You will receive a letter from Dr. Deatra Ina in 1-2  weeks, after reviewing the final pathology, with followup  recommendations.    ______________________________  Sandy Salaam Deatra Ina, MD    CC:    n.  eSIGNED: Sandy Salaam. Kaplan at 11/05/2010 02:45 PM ASSESSMENT AND PLAN:  Discussed the following assessment and plan:  Rectal bleeding - from rectal  area  large lump presumed thrombosed hemorrhoid or other lesion refer to GI to evaluate - Plan: POC Hemoglobin, Ambulatory referral to Gastroenterology  Hemorrhoids, unspecified hemorrhoid type - Plan: POC Hemoglobin, Ambulatory referral to Gastroenterology  -Patient advised to return or notify health care team  if symptoms worsen ,persist or new concerns arise.  Patient Instructions  Looks like a very larger hemorrhoid    With bleeding .       Plan gi eval  For reasons stated ?  Cool compresses .  Hg check today  To be sure not anemic.   Vinegar not causeing this   Eat so stool is soft  And not need to strain .   About Hemorrhoids  Hemorrhoids are swollen veins in the lower rectum and anus.  Also called piles, hemorrhoids are a common problem.  Hemorrhoids may be internal (inside the  rectum) or external (around the anus).  Internal Hemorrhoids  Internal hemorrhoids are often painless, but they rarely  cause bleeding.  The internal veins may stretch and fall down (prolapse) through the anus to the outside of the body.  The veins may then become irritated and painful.  External Hemorrhoids  External hemorrhoids can be easily seen or felt around the anal opening.  They are under the skin around the anus.  When the swollen veins are scratched or broken by straining, rubbing or wiping they sometimes bleed.  How Hemorrhoids Occur  Veins in the rectum and around the anus tend to swell under pressure.  Hemorrhoids can result from increased pressure in the veins of your anus or rectum.  Some sources of pressure are:   Straining to have a bowel movement because of constipation  Waiting too long to have a bowel movement  Coughing and sneezing often  Sitting for extended periods of time, including on the toilet  Diarrhea  Obesity  Trauma or injury to the anus  Some liver diseases  Stress  Family history of hemorrhoids  Pregnancy  Pregnant women should try to avoid becoming constipated, because they are more likely to have hemorrhoids during pregnancy.  In the last trimester of pregnancy, the enlarged uterus may press on blood vessels and causes hemorrhoids.  In addition, the strain of childbirth sometimes causes hemorrhoids after the birth.  Symptoms of Hemorrhoids  Some symptoms of hemorrhoids include:  Swelling and/or a tender lump around the anus  Itching, mild burning and bleeding around the anus  Painful bowel movements with or without constipation  Bright red blood covering the stool, on toilet paper or in the toilet bowel.   Symptoms usually go away within a few days.  Always talk to your doctor about any bleeding to make sure it is not from some other causes.  Diagnosing and Treating Hemorrhoids  Diagnosis is made by an examination by your  healthcare provider.  Special test can be performed by your doctor.    Most cases of hemorrhoids can be treated with:  High-fiber diet: Eat more high-fiber foods, which help prevent constipation.  Ask for more detailed fiber information on types and sources of fiber from your healthcare provider.  Fluids: Drink plenty of water.  This helps soften bowel movements so they are easier to pass.  Sitz baths and cold packs: Sitting in lukewarm water two or three times a day for 15 minutes cleases the anal area and may relieve discomfort.  If the water is too hot, swelling around the anus will get worse.  Placing a cloth-covered ice pack on the anus for ten minutes four times a day can also help reduce selling.  Gently pushing a prolapsed hemorrhoid back inside after the bath or ice pack can be helpful.  Medications: For mild discomfort, your healthcare provider may suggest over-the-counter pain medication or prescribe a cream or ointment for topical use.  The cream may contain witch hazel, zinc oxide or petroleum jelly.  Medicated suppositories are also a treatment option.  Always consult your doctor before applying medications or creams.  Procedures and surgeries: There are also a number of procedures and surgeries to shrink or remove hemorrhoids in more serious cases.  Talk to your physician about these options.  You can often prevent hemorrhoids or keep them from becoming worse by maintaining a healthy lifestyle.  Eat a fiber-rich diet of fruits, vegetables and whole grains.  Also, drink plenty of water and exercise regularly.   2007, [employer] Doc.Dent Gannon Heinzman M.D.

## 2016-02-18 NOTE — Progress Notes (Signed)
Pre visit review using our clinic review tool, if applicable. No additional management support is needed unless otherwise documented below in the visit note. 

## 2016-02-18 NOTE — Patient Instructions (Addendum)
Looks like a very larger hemorrhoid    With bleeding .       Plan gi eval  For reasons stated ?  Cool compresses .  Hg check today  To be sure not anemic.   Vinegar not causeing this   Eat so stool is soft  And not need to strain .   About Hemorrhoids  Hemorrhoids are swollen veins in the lower rectum and anus.  Also called piles, hemorrhoids are a common problem.  Hemorrhoids may be internal (inside the rectum) or external (around the anus).  Internal Hemorrhoids  Internal hemorrhoids are often painless, but they rarely cause bleeding.  The internal veins may stretch and fall down (prolapse) through the anus to the outside of the body.  The veins may then become irritated and painful.  External Hemorrhoids  External hemorrhoids can be easily seen or felt around the anal opening.  They are under the skin around the anus.  When the swollen veins are scratched or broken by straining, rubbing or wiping they sometimes bleed.  How Hemorrhoids Occur  Veins in the rectum and around the anus tend to swell under pressure.  Hemorrhoids can result from increased pressure in the veins of your anus or rectum.  Some sources of pressure are:   Straining to have a bowel movement because of constipation  Waiting too long to have a bowel movement  Coughing and sneezing often  Sitting for extended periods of time, including on the toilet  Diarrhea  Obesity  Trauma or injury to the anus  Some liver diseases  Stress  Family history of hemorrhoids  Pregnancy  Pregnant women should try to avoid becoming constipated, because they are more likely to have hemorrhoids during pregnancy.  In the last trimester of pregnancy, the enlarged uterus may press on blood vessels and causes hemorrhoids.  In addition, the strain of childbirth sometimes causes hemorrhoids after the birth.  Symptoms of Hemorrhoids  Some symptoms of hemorrhoids include:  Swelling and/or a tender lump around the  anus  Itching, mild burning and bleeding around the anus  Painful bowel movements with or without constipation  Bright red blood covering the stool, on toilet paper or in the toilet bowel.   Symptoms usually go away within a few days.  Always talk to your doctor about any bleeding to make sure it is not from some other causes.  Diagnosing and Treating Hemorrhoids  Diagnosis is made by an examination by your healthcare provider.  Special test can be performed by your doctor.    Most cases of hemorrhoids can be treated with:  High-fiber diet: Eat more high-fiber foods, which help prevent constipation.  Ask for more detailed fiber information on types and sources of fiber from your healthcare provider.  Fluids: Drink plenty of water.  This helps soften bowel movements so they are easier to pass.  Sitz baths and cold packs: Sitting in lukewarm water two or three times a day for 15 minutes cleases the anal area and may relieve discomfort.  If the water is too hot, swelling around the anus will get worse.  Placing a cloth-covered ice pack on the anus for ten minutes four times a day can also help reduce selling.  Gently pushing a prolapsed hemorrhoid back inside after the bath or ice pack can be helpful.  Medications: For mild discomfort, your healthcare provider may suggest over-the-counter pain medication or prescribe a cream or ointment for topical use.  The cream may contain witch  hazel, zinc oxide or petroleum jelly.  Medicated suppositories are also a treatment option.  Always consult your doctor before applying medications or creams.  Procedures and surgeries: There are also a number of procedures and surgeries to shrink or remove hemorrhoids in more serious cases.  Talk to your physician about these options.  You can often prevent hemorrhoids or keep them from becoming worse by maintaining a healthy lifestyle.  Eat a fiber-rich diet of fruits, vegetables and whole grains.  Also, drink  plenty of water and exercise regularly.   2007, [employer] Doc.30

## 2016-02-22 ENCOUNTER — Ambulatory Visit: Payer: Self-pay | Admitting: Neurology

## 2016-02-24 ENCOUNTER — Other Ambulatory Visit: Payer: Self-pay

## 2016-02-24 MED ORDER — MELOXICAM 15 MG PO TABS: 15.0000 mg | ORAL_TABLET | Freq: Every day | ORAL | Status: DC

## 2016-02-24 MED ORDER — TIZANIDINE HCL 4 MG PO TABS: 4.0000 mg | ORAL_TABLET | Freq: Three times a day (TID) | ORAL | Status: DC | PRN

## 2016-02-24 MED ORDER — GABAPENTIN 100 MG PO CAPS: 100.0000 mg | ORAL_CAPSULE | Freq: Three times a day (TID) | ORAL | Status: DC

## 2016-02-29 ENCOUNTER — Encounter: Payer: Self-pay | Admitting: Neurology

## 2016-02-29 ENCOUNTER — Ambulatory Visit (INDEPENDENT_AMBULATORY_CARE_PROVIDER_SITE_OTHER): Payer: Commercial Managed Care - HMO | Admitting: Neurology

## 2016-02-29 VITALS — BP 124/72 | HR 87 | Ht 62.0 in | Wt 198.0 lb

## 2016-02-29 DIAGNOSIS — R51 Headache: Secondary | ICD-10-CM

## 2016-02-29 DIAGNOSIS — G4486 Cervicogenic headache: Secondary | ICD-10-CM

## 2016-02-29 NOTE — Patient Instructions (Signed)
Continue gabapentin 100mg  in AM and 200mg  at bedtime Tizanidine as needed Follow up in 6 months.

## 2016-02-29 NOTE — Progress Notes (Signed)
NEUROLOGY FOLLOW UP OFFICE NOTE  WINEFRED DIELEMAN [id number]  HISTORY OF PRESENT ILLNESS: Lauran Sundet is a 65 year old right-handed woman with degenerative arthritis of the cervical spine who follows up for cervicogenic headache.  Recent history obtained by patient and Dr. Thompson Caul notes.  Imaging of brain and cervical MRI reviewed.  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  She received trigger point injections and home exercises.   Headaches and neck pain are improved.  Gabapentin 100mg /200mg   tizanidine (about 2 tablets daily)   HISTORY: On 10/28/14, her husband fell.  When she tried to get him up, she developed this severe pain in the back of her head.  She didn't notice pulling her neck.  She has had a constant occipital headache since then.  It is located on the left side.  When she moves her neck or head slightly in any direction, she feels a burning shooting pain up to the front of the head and down the left side of her neck to the shoulder.  She denies any numbness or tingling in the back of the head or in the extremities.  She denies visual symptoms, nausea, photophobia, phonophobia or dizziness.  Besides head movement, stress also exacerbates it.  Applying pressure to the area seems to help.  She has suffered the loss of both her husband and sister in the past several weeks.  More recently, she notes cracking in her neck when she moves.    Treatment:  Tylenol, heating pad, ice, massage (only briefly helps for 10 minutes)  MRI of the brain with and without contrast from 06/15/15 showed no acute intracranial process, but did reveal degenerative changes at left C1-2.  MRI of cervical spine from 08/07/15 revealed showed moderate bilateral neural foraminal stenosis at C5, C6 and C7 nerve levels.  There was also degenerative left C1-C2 articulation with acute marrow edema and involving the central C2 odontoid.  Follow up CT of cervical spine verified extensive C1-2 degenerative  changes.  PAST MEDICAL HISTORY: Past Medical History  Diagnosis Date  . Bowen's disease     excised 1992  . Hx of varicella   . Headache     MEDICATIONS: Current Outpatient Prescriptions on File Prior to Visit  Medication Sig Dispense Refill  . aspirin 81 MG tablet Take 81 mg by mouth daily.    Marland Kitchen gabapentin (NEURONTIN) 100 MG capsule Take 1 capsule (100 mg total) by mouth 3 (three) times daily. 270 capsule 0  . meloxicam (MOBIC) 15 MG tablet Take 1 tablet (15 mg total) by mouth daily. 90 tablet 0  . Multiple Vitamin (MULTIVITAMIN) tablet Take 1 tablet by mouth daily.     Marland Kitchen tiZANidine (ZANAFLEX) 4 MG tablet Take 1 tablet (4 mg total) by mouth every 8 (eight) hours as needed for muscle spasms. 90 tablet 0   No current facility-administered medications on file prior to visit.    ALLERGIES: Allergies  Allergen Reactions  . Benadryl [Diphenhydramine] Other (See Comments)    Tingle all over   . Melatonin     Tingle all over   . Penicillins Rash    FAMILY HISTORY: Family History  Problem Relation Age of Onset  . Breast cancer      mom and MGM and sister   . Pancreatitis Father   . Cancer Mother     lung  . Cancer Sister     breast/melanoma  . Cancer Maternal Grandmother     breast   . Cancer  Maternal Aunt     breast    SOCIAL HISTORY: Social History   Social History  . Marital Status: Married    Spouse Name: N/A  . Number of Children: N/A  . Years of Education: N/A   Occupational History  . Not on file.   Social History Main Topics  . Smoking status: Never Smoker   . Smokeless tobacco: Not on file  . Alcohol Use: 0.0 oz/week    0 Standard drinks or equivalent per week     Comment: occasional  . Drug Use: Yes    Special: Marijuana  . Sexual Activity: No   Other Topics Concern  . Not on file   Social History Narrative   H H  of 1      5 pets.   She is a former smoker   Retired Engineer, mining fine arts   Husband smokes he has  active heart disease and vascular disease   etoh   Red wine  1 per night.    Tea green tea and earl gray    [place]oved from [place] to [place] area in 09-Jan-1984   1 pregnancy   Husband died spring  2016 cv   Sister died 03/11/15  Bone cancer                 REVIEW OF SYSTEMS: Constitutional: No fevers, chills, or sweats, no generalized fatigue, change in appetite Eyes: No visual changes, double vision, eye pain Ear, nose and throat: No hearing loss, ear pain, nasal congestion, sore throat Cardiovascular: No chest pain, palpitations Respiratory:  No shortness of breath at rest or with exertion, wheezes GastrointestinaI: No nausea, vomiting, diarrhea, abdominal pain, fecal incontinence Genitourinary:  No dysuria, urinary retention or frequency Musculoskeletal:  No neck pain, back pain Integumentary: No rash, pruritus, skin lesions Neurological: as above Psychiatric: No depression, insomnia, anxiety Endocrine: No palpitations, fatigue, diaphoresis, mood swings, change in appetite, change in weight, increased thirst Hematologic/Lymphatic:  No purpura, petechiae. Allergic/Immunologic: no itchy/runny eyes, nasal congestion, recent allergic reactions, rashes  PHYSICAL EXAM: Filed Vitals:   02/29/16 1458  BP: 124/72  Pulse: 87   General: No acute distress.  Patient appears well-groomed.  normal body habitus. Head:  Normocephalic/atraumatic Eyes:  Fundi examined but not visualized Neck: supple, mild paraspinal tenderness, decreased ROM in all directions Heart:  Regular rate and rhythm Lungs:  Clear to auscultation bilaterally Back: No paraspinal tenderness Neurological Exam: alert and oriented to person, place, and time. Attention span and concentration intact, recent and remote memory intact, fund of knowledge intact.  Speech fluent and not dysarthric, language intact.  CN II-XII intact. Bulk and tone normal, muscle strength 5/5 throughout.  Sensation to light touch intact.  Deep tendon  reflexes 2+ throughout, toes downgoing.  Finger to nose testing intact.  Gait normal  IMPRESSION: Cervicogenic headache improved  PLAN: Gabapentin 100mg  in AM and 200mg  at night Tizanidine as needed Follow up in 6 months.  20 minutes spent face to face with patient, over 50% spent discussing management.  Metta Clines, DO  CC:  Shanon Ace, MD

## 2016-03-04 ENCOUNTER — Encounter: Payer: Self-pay | Admitting: Gastroenterology

## 2016-03-04 ENCOUNTER — Ambulatory Visit (INDEPENDENT_AMBULATORY_CARE_PROVIDER_SITE_OTHER): Payer: Commercial Managed Care - HMO | Admitting: Gastroenterology

## 2016-03-04 VITALS — BP 112/80 | HR 76 | Ht 62.0 in | Wt 199.0 lb

## 2016-03-04 DIAGNOSIS — K645 Perianal venous thrombosis: Secondary | ICD-10-CM

## 2016-03-04 MED ORDER — PHENYLEPH-SHARK LIV OIL-MO-PET 0.25-3-14-71.9 % RE OINT: 1.0000 "application " | TOPICAL_OINTMENT | Freq: Two times a day (BID) | RECTAL | Status: DC | PRN

## 2016-03-04 NOTE — Patient Instructions (Addendum)
We have made arrangements for you to see a surgeon regarding your hemorrhoid. In the meantime, please use the hemorrhoidal ointment I prescribed three times a day.  You have been scheduled for an appointment with Dr Ralene Ok at [facility]. Your appointment is on Monday, 03/07/16 at 1:30 pm. Please arrive at 1:00 pm for registration. Make certain to bring a list of current medications, including any over the counter medications or vitamins. Also bring your co-pay if you have one as well as your insurance cards and a picture ID. [facility] is located at [address], [address]. Should you need to reschedule your appointment, please contact them at [phone].  If you are age 67 or older, your body mass index should be between 23-30. Your Body mass index is 36.39 kg/(m^2). If this is out of the aforementioned range listed, please consider follow up with your Primary Care Provider.  If you are age 64 or younger, your body mass index should be between 19-25. Your Body mass index is 36.39 kg/(m^2). If this is out of the aformentioned range listed, please consider follow up with your Primary Care Provider.

## 2016-03-04 NOTE — Progress Notes (Signed)
[facility] Consult Note:  History: Ashley Moore 03/04/2016  Referring physician: Lottie Dawson, MD  Reason for consult/chief complaint: Rectal Bleeding   Subjective HPI:  Catheter was referred by primary care for recent rectal bleeding and pain. She was last seen by Dr. Deatra Ina for screening colonoscopy in February 2012, only a sigmoid hyperplastic polyp and diverticulosis was found. No hemorrhoids were reported at that time. The last several months she has had intermittent rectal bleeding that she attributes to stress because she has had difficulties with a neighbor recently. She tends toward constipation at times, but says she only feels the need to take stool softeners or laxatives perhaps every few weeks. She is somewhat inconsistent in that history. In the last month she has had worsening rectal pressure bleeding and itching. It is uncomfortable to sit as well.  ROS:  Review of Systems She denies chest pain dyspnea or dysuria  Past Medical History: Past Medical History  Diagnosis Date  . Bowen's disease     excised 1992  . Hx of varicella   . Headache      Past Surgical History: Past Surgical History  Procedure Laterality Date  . Bunionectomy Right   . Skin cancer excision      bowens disease     Family History: Family History  Problem Relation Age of Onset  . Pancreatitis Father   . Lung cancer Mother     lung  . Breast cancer Sister   . Breast cancer Maternal Grandmother     breast   . Breast cancer Maternal Aunt     breast  . Melanoma Sister     Social History: Social History   Social History  . Marital Status: Married    Spouse Name: N/A  . Number of Children: N/A  . Years of Education: N/A   Social History Main Topics  . Smoking status: Former Research scientist (life sciences)  . Smokeless tobacco: Former Systems developer    Quit date: 09/20/1995  . Alcohol Use: 0.0 oz/week    0 Standard drinks or equivalent per week     Comment: occasional  . Drug  Use: Yes    Special: Marijuana  . Sexual Activity: No   Other Topics Concern  . None   Social History Narrative   H H  of 1      5 pets.   She is a former smoker   Retired Engineer, mining fine arts   Husband smokes he has active heart disease and vascular disease   etoh   Red wine  1 per night.    Tea green tea and earl gray    Moved from [place] to [place] area in 1984/01/13   1 pregnancy   Husband died spring  2016 cv   Sister died 03/15/2015  Bone cancer                 Allergies: Allergies  Allergen Reactions  . Benadryl [Diphenhydramine] Other (See Comments)    Tingle all over   . Melatonin     Tingle all over   . Penicillins Rash    Outpatient Meds: Current Outpatient Prescriptions  Medication Sig Dispense Refill  . aspirin 81 MG tablet Take 81 mg by mouth daily.    Marland Kitchen gabapentin (NEURONTIN) 100 MG capsule Take 1 capsule (100 mg total) by mouth 3 (three) times daily. 270 capsule 0  . meloxicam (MOBIC) 15 MG tablet Take 1 tablet (15 mg total) by mouth daily. 90 tablet 0  .  Multiple Vitamin (MULTIVITAMIN) tablet Take 1 tablet by mouth daily.     Marland Kitchen tiZANidine (ZANAFLEX) 4 MG tablet Take 1 tablet (4 mg total) by mouth every 8 (eight) hours as needed for muscle spasms. 90 tablet 0  . phenylephrine-shark liver oil-mineral oil-petrolatum (PREPARATION H) 0.25-3-14-71.9 % rectal ointment Place 1 application rectally 2 (two) times daily as needed for hemorrhoids. 30 g 0   No current facility-administered medications for this visit.      ___________________________________________________________________ Objective  Exam:  BP 112/80 mmHg  Pulse 76  Ht 5\' 2"  (1.575 m)  Wt 199 lb (90.266 kg)  BMI 36.39 kg/m2   General: this is a(n) Well-appearing woman   Eyes: sclera anicteric, no redness  ENT: oral mucosa moist without lesions, no cervical or supraclavicular lymphadenopathy, good dentition  CV: RRR without murmur, S1/S2, no JVD, no peripheral  edema  Resp: clear to auscultation bilaterally, normal RR and effort noted  GI: soft, no tenderness, with active bowel sounds. No guarding or palpable organomegaly noted.  Skin; warm and dry, no rash or jaundice noted  Neuro: awake, alert and oriented x 3. Normal gross motor function and fluent speech Rectal exam, chaperoned by our MA Dottie: Large tender thrombosed left-sided external hemorrhoid. The firmness and tenderness seems to extend into the internal hemorrhoidal plexus as well.  Assessment: Encounter Diagnosis  Name Primary?  . External hemorrhoid, thrombosed Yes    This is clearly the source of her symptoms, and it needs surgical management.  Plan:  I prescribed her some hemorrhoidal cream in hopes that it will relieve the symptoms somewhat until she can see surgery next week. We have made a referral and the appointment for her. I would also like her to take a stool softener and some MiraLAX powder on a regular basis  Thank you for the courtesy of this consult.  Please call me with any questions or concerns.  Nelida Meuse III  CC: Lottie Dawson, MD

## 2016-03-07 DIAGNOSIS — K644 Residual hemorrhoidal skin tags: Secondary | ICD-10-CM | POA: Diagnosis not present
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
## 2016-04-29 ENCOUNTER — Ambulatory Visit (INDEPENDENT_AMBULATORY_CARE_PROVIDER_SITE_OTHER): Payer: Commercial Managed Care - HMO

## 2016-04-29 VITALS — BP 134/80 | HR 61 | Ht 62.5 in | Wt 198.2 lb

## 2016-04-29 DIAGNOSIS — Z0000 Encounter for general adult medical examination without abnormal findings: Secondary | ICD-10-CM | POA: Diagnosis not present

## 2016-04-29 DIAGNOSIS — Z23 Encounter for immunization: Secondary | ICD-10-CM | POA: Diagnosis not present

## 2016-04-29 DIAGNOSIS — Z780 Asymptomatic menopausal state: Secondary | ICD-10-CM

## 2016-04-29 DIAGNOSIS — Z7289 Other problems related to lifestyle: Secondary | ICD-10-CM

## 2016-04-29 NOTE — Patient Instructions (Addendum)
Ms. Boyland , Thank you for taking time to come for your Medicare Wellness Visit. I appreciate your ongoing commitment to your health goals. Please review the following plan we discussed and let me know if I can assist you in the future.   Keep your exercise up  Will take hepatitis C next blood draw  Dexa scan ordered and can have the pain center   Took PSV 23 today    These are the goals we discussed: Goals    . Exercise 150 minutes per week (moderate activity)          Continue with aggressive exercise x 5 days a week   Start 30 minutes;        This is a list of the screening recommended for you and due dates:  Health Maintenance  Topic Date Due  .  Hepatitis C: One time screening is recommended by Center for Disease Control  (CDC) for  adults born from [birth year] through 1967.   December 12, 1950  . DEXA scan (bone density measurement)  08/22/2014  . Pneumonia vaccines (2 of 2 - PPSV23) 03/10/2016  . Flu Shot  04/19/2016  . Mammogram  10/05/2017  . Colon Cancer Screening  11/05/2020  . Tetanus Vaccine  02/14/2023  . Shingles Vaccine  Completed      Fall Prevention in the Home  Falls can cause injuries. They can happen to people of all ages. There are many things you can do to make your home safe and to help prevent falls.  WHAT CAN I DO ON THE OUTSIDE OF MY HOME?  Regularly fix the edges of walkways and driveways and fix any cracks.  Remove anything that might make you trip as you walk through a door, such as a raised step or threshold.  Trim any bushes or trees on the path to your home.  Use bright outdoor lighting.  Clear any walking paths of anything that might make someone trip, such as rocks or tools.  Regularly check to see if handrails are loose or broken. Make sure that both sides of any steps have handrails.  Any raised decks and porches should have guardrails on the edges.  Have any leaves, snow, or ice cleared regularly.  Use sand or salt on walking paths  during winter.  Clean up any spills in your garage right away. This includes oil or grease spills. WHAT CAN I DO IN THE BATHROOM?   Use night lights.  Install grab bars by the toilet and in the tub and shower. Do not use towel bars as grab bars.  Use non-skid mats or decals in the tub or shower.  If you need to sit down in the shower, use a plastic, non-slip stool.  Keep the floor dry. Clean up any water that spills on the floor as soon as it happens.  Remove soap buildup in the tub or shower regularly.  Attach bath mats securely with double-sided non-slip rug tape.  Do not have throw rugs and other things on the floor that can make you trip. WHAT CAN I DO IN THE BEDROOM?  Use night lights.  Make sure that you have a light by your bed that is easy to reach.  Do not use any sheets or blankets that are too big for your bed. They should not hang down onto the floor.  Have a firm chair that has side arms. You can use this for support while you get dressed.  Do not have throw rugs and  other things on the floor that can make you trip. WHAT CAN I DO IN THE KITCHEN?  Clean up any spills right away.  Avoid walking on wet floors.  Keep items that you use a lot in easy-to-reach places.  If you need to reach something above you, use a strong step stool that has a grab bar.  Keep electrical cords out of the way.  Do not use floor polish or wax that makes floors slippery. If you must use wax, use non-skid floor wax.  Do not have throw rugs and other things on the floor that can make you trip. WHAT CAN I DO WITH MY STAIRS?  Do not leave any items on the stairs.  Make sure that there are handrails on both sides of the stairs and use them. Fix handrails that are broken or loose. Make sure that handrails are as long as the stairways.  Check any carpeting to make sure that it is firmly attached to the stairs. Fix any carpet that is loose or worn.  Avoid having throw rugs at the top  or bottom of the stairs. If you do have throw rugs, attach them to the floor with carpet tape.  Make sure that you have a light switch at the top of the stairs and the bottom of the stairs. If you do not have them, ask someone to add them for you. WHAT ELSE CAN I DO TO HELP PREVENT FALLS?  Wear shoes that:  Do not have high heels.  Have rubber bottoms.  Are comfortable and fit you well.  Are closed at the toe. Do not wear sandals.  If you use a stepladder:  Make sure that it is fully opened. Do not climb a closed stepladder.  Make sure that both sides of the stepladder are locked into place.  Ask someone to hold it for you, if possible.  Clearly mark and make sure that you can see:  Any grab bars or handrails.  First and last steps.  Where the edge of each step is.  Use tools that help you move around (mobility aids) if they are needed. These include:  Canes.  Walkers.  Scooters.  Crutches.  Turn on the lights when you go into a dark area. Replace any light bulbs as soon as they burn out.  Set up your furniture so you have a clear path. Avoid moving your furniture around.  If any of your floors are uneven, fix them.  If there are any pets around you, be aware of where they are.  Review your medicines with your doctor. Some medicines can make you feel dizzy. This can increase your chance of falling. Ask your doctor what other things that you can do to help prevent falls.   This information is not intended to replace advice given to you by your health care provider. Make sure you discuss any questions you have with your health care provider.   Document Released: 07/02/2009 Document Revised: 01/20/2015 Document Reviewed: 10/10/2014 Elsevier Interactive Patient Education 2016 [place] Maintenance, Female Adopting a healthy lifestyle and getting preventive care can go a long way to promote health and wellness. Talk with your health care provider  about what schedule of regular examinations is right for you. This is a good chance for you to check in with your provider about disease prevention and staying healthy. In between checkups, there are plenty of things you can do on your own. Experts have done a lot of  research about which lifestyle changes and preventive measures are most likely to keep you healthy. Ask your health care provider for more information. WEIGHT AND DIET  Eat a healthy diet  Be sure to include plenty of vegetables, fruits, low-fat dairy products, and lean protein.  Do not eat a lot of foods high in solid fats, added sugars, or salt.  Get regular exercise. This is one of the most important things you can do for your health.  Most adults should exercise for at least 150 minutes each week. The exercise should increase your heart rate and make you sweat (moderate-intensity exercise).  Most adults should also do strengthening exercises at least twice a week. This is in addition to the moderate-intensity exercise.  Maintain a healthy weight  Body mass index (BMI) is a measurement that can be used to identify possible weight problems. It estimates body fat based on height and weight. Your health care provider can help determine your BMI and help you achieve or maintain a healthy weight.  For females 51 years of age and older:   A BMI below 18.5 is considered underweight.  A BMI of 18.5 to 24.9 is normal.  A BMI of 25 to 29.9 is considered overweight.  A BMI of 30 and above is considered obese.  Watch levels of cholesterol and blood lipids  You should start having your blood tested for lipids and cholesterol at 67 years of age, then have this test every 5 years.  You may need to have your cholesterol levels checked more often if:  Your lipid or cholesterol levels are high.  You are older than 67 years of age.  You are at high risk for heart disease.  CANCER SCREENING   Lung Cancer  Lung cancer  screening is recommended for adults 74-83 years old who are at high risk for lung cancer because of a history of smoking.  A yearly low-dose CT scan of the lungs is recommended for people who:  Currently smoke.  Have quit within the past 15 years.  Have at least a 30-pack-year history of smoking. A pack year is smoking an average of one pack of cigarettes a day for 1 year.  Yearly screening should continue until it has been 15 years since you quit.  Yearly screening should stop if you develop a health problem that would prevent you from having lung cancer treatment.  Breast Cancer  Practice breast self-awareness. This means understanding how your breasts normally appear and feel.  It also means doing regular breast self-exams. Let your health care provider know about any changes, no matter how small.  If you are in your 20s or 30s, you should have a clinical breast exam (CBE) by a health care provider every 1-3 years as part of a regular health exam.  If you are 66 or older, have a CBE every year. Also consider having a breast X-ray (mammogram) every year.  If you have a family history of breast cancer, talk to your health care provider about genetic screening.  If you are at high risk for breast cancer, talk to your health care provider about having an MRI and a mammogram every year.  Breast cancer gene (BRCA) assessment is recommended for women who have family members with BRCA-related cancers. BRCA-related cancers include:  Breast.  Ovarian.  Tubal.  Peritoneal cancers.  Results of the assessment will determine the need for genetic counseling and BRCA1 and BRCA2 testing. Cervical Cancer Your health care provider may recommend  that you be screened regularly for cancer of the pelvic organs (ovaries, uterus, and vagina). This screening involves a pelvic examination, including checking for microscopic changes to the surface of your cervix (Pap test). You may be encouraged to  have this screening done every 3 years, beginning at age 64.  For women ages 22-65, health care providers may recommend pelvic exams and Pap testing every 3 years, or they may recommend the Pap and pelvic exam, combined with testing for human papilloma virus (HPV), every 5 years. Some types of HPV increase your risk of cervical cancer. Testing for HPV may also be done on women of any age with unclear Pap test results.  Other health care providers may not recommend any screening for nonpregnant women who are considered low risk for pelvic cancer and who do not have symptoms. Ask your health care provider if a screening pelvic exam is right for you.  If you have had past treatment for cervical cancer or a condition that could lead to cancer, you need Pap tests and screening for cancer for at least 20 years after your treatment. If Pap tests have been discontinued, your risk factors (such as having a new sexual partner) need to be reassessed to determine if screening should resume. Some women have medical problems that increase the chance of getting cervical cancer. In these cases, your health care provider may recommend more frequent screening and Pap tests. Colorectal Cancer  This type of cancer can be detected and often prevented.  Routine colorectal cancer screening usually begins at 67 years of age and continues through 67 years of age.  Your health care provider may recommend screening at an earlier age if you have risk factors for colon cancer.  Your health care provider may also recommend using home test kits to check for hidden blood in the stool.  A small camera at the end of a tube can be used to examine your colon directly (sigmoidoscopy or colonoscopy). This is done to check for the earliest forms of colorectal cancer.  Routine screening usually begins at age 61.  Direct examination of the colon should be repeated every 5-10 years through 67 years of age. However, you may need to be  screened more often if early forms of precancerous polyps or small growths are found. Skin Cancer  Check your skin from head to toe regularly.  Tell your health care provider about any new moles or changes in moles, especially if there is a change in a mole's shape or color.  Also tell your health care provider if you have a mole that is larger than the size of a pencil eraser.  Always use sunscreen. Apply sunscreen liberally and repeatedly throughout the day.  Protect yourself by wearing long sleeves, pants, a wide-brimmed hat, and sunglasses whenever you are outside. HEART DISEASE, DIABETES, AND HIGH BLOOD PRESSURE   High blood pressure causes heart disease and increases the risk of stroke. High blood pressure is more likely to develop in:  People who have blood pressure in the high end of the normal range (130-139/85-89 mm Hg).  People who are overweight or obese.  People who are African American.  If you are 35-46 years of age, have your blood pressure checked every 3-5 years. If you are 28 years of age or older, have your blood pressure checked every year. You should have your blood pressure measured twice--once when you are at a hospital or clinic, and once when you are not at a  hospital or clinic. Record the average of the two measurements. To check your blood pressure when you are not at a hospital or clinic, you can use:  An automated blood pressure machine at a pharmacy.  A home blood pressure monitor.  If you are between 40 years and 28 years old, ask your health care provider if you should take aspirin to prevent strokes.  Have regular diabetes screenings. This involves taking a blood sample to check your fasting blood sugar level.  If you are at a normal weight and have a low risk for diabetes, have this test once every three years after 67 years of age.  If you are overweight and have a high risk for diabetes, consider being tested at a younger age or more  often. PREVENTING INFECTION  Hepatitis B  If you have a higher risk for hepatitis B, you should be screened for this virus. You are considered at high risk for hepatitis B if:  You were born in a country where hepatitis B is common. Ask your health care provider which countries are considered high risk.  Your parents were born in a high-risk country, and you have not been immunized against hepatitis B (hepatitis B vaccine).  You have HIV or AIDS.  You use needles to inject street drugs.  You live with someone who has hepatitis B.  You have had sex with someone who has hepatitis B.  You get hemodialysis treatment.  You take certain medicines for conditions, including cancer, organ transplantation, and autoimmune conditions. Hepatitis C  Blood testing is recommended for:  Everyone born from 50 through 1965.  Anyone with known risk factors for hepatitis C. Sexually transmitted infections (STIs)  You should be screened for sexually transmitted infections (STIs) including gonorrhea and chlamydia if:  You are sexually active and are younger than 67 years of age.  You are older than 67 years of age and your health care provider tells you that you are at risk for this type of infection.  Your sexual activity has changed since you were last screened and you are at an increased risk for chlamydia or gonorrhea. Ask your health care provider if you are at risk.  If you do not have HIV, but are at risk, it may be recommended that you take a prescription medicine daily to prevent HIV infection. This is called pre-exposure prophylaxis (PrEP). You are considered at risk if:  You are sexually active and do not regularly use condoms or know the HIV status of your partner(s).  You take drugs by injection.  You are sexually active with a partner who has HIV. Talk with your health care provider about whether you are at high risk of being infected with HIV. If you choose to begin PrEP, you  should first be tested for HIV. You should then be tested every 3 months for as long as you are taking PrEP.  PREGNANCY   If you are premenopausal and you may become pregnant, ask your health care provider about preconception counseling.  If you may become pregnant, take 400 to 800 micrograms (mcg) of folic acid every day.  If you want to prevent pregnancy, talk to your health care provider about birth control (contraception). OSTEOPOROSIS AND MENOPAUSE   Osteoporosis is a disease in which the bones lose minerals and strength with aging. This can result in serious bone fractures. Your risk for osteoporosis can be identified using a bone density scan.  If you are 40 years of age  or older, or if you are at risk for osteoporosis and fractures, ask your health care provider if you should be screened.  Ask your health care provider whether you should take a calcium or vitamin D supplement to lower your risk for osteoporosis.  Menopause may have certain physical symptoms and risks.  Hormone replacement therapy may reduce some of these symptoms and risks. Talk to your health care provider about whether hormone replacement therapy is right for you.  HOME CARE INSTRUCTIONS   Schedule regular health, dental, and eye exams.  Stay current with your immunizations.   Do not use any tobacco products including cigarettes, chewing tobacco, or electronic cigarettes.  If you are pregnant, do not drink alcohol.  If you are breastfeeding, limit how much and how often you drink alcohol.  Limit alcohol intake to no more than 1 drink per day for nonpregnant women. One drink equals 12 ounces of beer, 5 ounces of wine, or 1 ounces of hard liquor.  Do not use street drugs.  Do not share needles.  Ask your health care provider for help if you need support or information about quitting drugs.  Tell your health care provider if you often feel depressed.  Tell your health care provider if you have ever  been abused or do not feel safe at home.   This information is not intended to replace advice given to you by your health care provider. Make sure you discuss any questions you have with your health care provider.   Document Released: 03/21/2011 Document Revised: 09/26/2014 Document Reviewed: 08/07/2013 Elsevier Interactive Patient Education 2016 [place] in the Home  Falls can cause injuries. They can happen to people of all ages. There are many things you can do to make your home safe and to help prevent falls.  WHAT CAN I DO ON THE OUTSIDE OF MY HOME?  Regularly fix the edges of walkways and driveways and fix any cracks.  Remove anything that might make you trip as you walk through a door, such as a raised step or threshold.  Trim any bushes or trees on the path to your home.  Use bright outdoor lighting.  Clear any walking paths of anything that might make someone trip, such as rocks or tools.  Regularly check to see if handrails are loose or broken. Make sure that both sides of any steps have handrails.  Any raised decks and porches should have guardrails on the edges.  Have any leaves, snow, or ice cleared regularly.  Use sand or salt on walking paths during winter.  Clean up any spills in your garage right away. This includes oil or grease spills. WHAT CAN I DO IN THE BATHROOM?   Use night lights.  Install grab bars by the toilet and in the tub and shower. Do not use towel bars as grab bars.  Use non-skid mats or decals in the tub or shower.  If you need to sit down in the shower, use a plastic, non-slip stool.  Keep the floor dry. Clean up any water that spills on the floor as soon as it happens.  Remove soap buildup in the tub or shower regularly.  Attach bath mats securely with double-sided non-slip rug tape.  Do not have throw rugs and other things on the floor that can make you trip. WHAT CAN I DO IN THE BEDROOM?  Use night  lights.  Make sure that you have a light by your  bed that is easy to reach.  Do not use any sheets or blankets that are too big for your bed. They should not hang down onto the floor.  Have a firm chair that has side arms. You can use this for support while you get dressed.  Do not have throw rugs and other things on the floor that can make you trip. WHAT CAN I DO IN THE KITCHEN?  Clean up any spills right away.  Avoid walking on wet floors.  Keep items that you use a lot in easy-to-reach places.  If you need to reach something above you, use a strong step stool that has a grab bar.  Keep electrical cords out of the way.  Do not use floor polish or wax that makes floors slippery. If you must use wax, use non-skid floor wax.  Do not have throw rugs and other things on the floor that can make you trip. WHAT CAN I DO WITH MY STAIRS?  Do not leave any items on the stairs.  Make sure that there are handrails on both sides of the stairs and use them. Fix handrails that are broken or loose. Make sure that handrails are as long as the stairways.  Check any carpeting to make sure that it is firmly attached to the stairs. Fix any carpet that is loose or worn.  Avoid having throw rugs at the top or bottom of the stairs. If you do have throw rugs, attach them to the floor with carpet tape.  Make sure that you have a light switch at the top of the stairs and the bottom of the stairs. If you do not have them, ask someone to add them for you. WHAT ELSE CAN I DO TO HELP PREVENT FALLS?  Wear shoes that:  Do not have high heels.  Have rubber bottoms.  Are comfortable and fit you well.  Are closed at the toe. Do not wear sandals.  If you use a stepladder:  Make sure that it is fully opened. Do not climb a closed stepladder.  Make sure that both sides of the stepladder are locked into place.  Ask someone to hold it for you, if possible.  Clearly mark and make sure that you can  see:  Any grab bars or handrails.  First and last steps.  Where the edge of each step is.  Use tools that help you move around (mobility aids) if they are needed. These include:  Canes.  Walkers.  Scooters.  Crutches.  Turn on the lights when you go into a dark area. Replace any light bulbs as soon as they burn out.  Set up your furniture so you have a clear path. Avoid moving your furniture around.  If any of your floors are uneven, fix them.  If there are any pets around you, be aware of where they are.  Review your medicines with your doctor. Some medicines can make you feel dizzy. This can increase your chance of falling. Ask your doctor what other things that you can do to help prevent falls.   This information is not intended to replace advice given to you by your health care provider. Make sure you discuss any questions you have with your health care provider.   Document Released: 07/02/2009 Document Revised: 01/20/2015 Document Reviewed: 10/10/2014 Elsevier Interactive Patient Education 2016 [place] Maintenance, Female Adopting a healthy lifestyle and getting preventive care can go a long way to promote health and wellness. Talk  with your health care provider about what schedule of regular examinations is right for you. This is a good chance for you to check in with your provider about disease prevention and staying healthy. In between checkups, there are plenty of things you can do on your own. Experts have done a lot of research about which lifestyle changes and preventive measures are most likely to keep you healthy. Ask your health care provider for more information. WEIGHT AND DIET  Eat a healthy diet  Be sure to include plenty of vegetables, fruits, low-fat dairy products, and lean protein.  Do not eat a lot of foods high in solid fats, added sugars, or salt.  Get regular exercise. This is one of the most important things you can do for your  health.  Most adults should exercise for at least 150 minutes each week. The exercise should increase your heart rate and make you sweat (moderate-intensity exercise).  Most adults should also do strengthening exercises at least twice a week. This is in addition to the moderate-intensity exercise.  Maintain a healthy weight  Body mass index (BMI) is a measurement that can be used to identify possible weight problems. It estimates body fat based on height and weight. Your health care provider can help determine your BMI and help you achieve or maintain a healthy weight.  For females 25 years of age and older:   A BMI below 18.5 is considered underweight.  A BMI of 18.5 to 24.9 is normal.  A BMI of 25 to 29.9 is considered overweight.  A BMI of 30 and above is considered obese.  Watch levels of cholesterol and blood lipids  You should start having your blood tested for lipids and cholesterol at 67 years of age, then have this test every 5 years.  You may need to have your cholesterol levels checked more often if:  Your lipid or cholesterol levels are high.  You are older than 67 years of age.  You are at high risk for heart disease.  CANCER SCREENING   Lung Cancer  Lung cancer screening is recommended for adults 51-23 years old who are at high risk for lung cancer because of a history of smoking.  A yearly low-dose CT scan of the lungs is recommended for people who:  Currently smoke.  Have quit within the past 15 years.  Have at least a 30-pack-year history of smoking. A pack year is smoking an average of one pack of cigarettes a day for 1 year.  Yearly screening should continue until it has been 15 years since you quit.  Yearly screening should stop if you develop a health problem that would prevent you from having lung cancer treatment.  Breast Cancer  Practice breast self-awareness. This means understanding how your breasts normally appear and feel.  It also  means doing regular breast self-exams. Let your health care provider know about any changes, no matter how small.  If you are in your 20s or 30s, you should have a clinical breast exam (CBE) by a health care provider every 1-3 years as part of a regular health exam.  If you are 50 or older, have a CBE every year. Also consider having a breast X-ray (mammogram) every year.  If you have a family history of breast cancer, talk to your health care provider about genetic screening.  If you are at high risk for breast cancer, talk to your health care provider about having an MRI and a mammogram every  year.  Breast cancer gene (BRCA) assessment is recommended for women who have family members with BRCA-related cancers. BRCA-related cancers include:  Breast.  Ovarian.  Tubal.  Peritoneal cancers.  Results of the assessment will determine the need for genetic counseling and BRCA1 and BRCA2 testing. Cervical Cancer Your health care provider may recommend that you be screened regularly for cancer of the pelvic organs (ovaries, uterus, and vagina). This screening involves a pelvic examination, including checking for microscopic changes to the surface of your cervix (Pap test). You may be encouraged to have this screening done every 3 years, beginning at age 74.  For women ages 48-65, health care providers may recommend pelvic exams and Pap testing every 3 years, or they may recommend the Pap and pelvic exam, combined with testing for human papilloma virus (HPV), every 5 years. Some types of HPV increase your risk of cervical cancer. Testing for HPV may also be done on women of any age with unclear Pap test results.  Other health care providers may not recommend any screening for nonpregnant women who are considered low risk for pelvic cancer and who do not have symptoms. Ask your health care provider if a screening pelvic exam is right for you.  If you have had past treatment for cervical cancer or a  condition that could lead to cancer, you need Pap tests and screening for cancer for at least 20 years after your treatment. If Pap tests have been discontinued, your risk factors (such as having a new sexual partner) need to be reassessed to determine if screening should resume. Some women have medical problems that increase the chance of getting cervical cancer. In these cases, your health care provider may recommend more frequent screening and Pap tests. Colorectal Cancer  This type of cancer can be detected and often prevented.  Routine colorectal cancer screening usually begins at 67 years of age and continues through 67 years of age.  Your health care provider may recommend screening at an earlier age if you have risk factors for colon cancer.  Your health care provider may also recommend using home test kits to check for hidden blood in the stool.  A small camera at the end of a tube can be used to examine your colon directly (sigmoidoscopy or colonoscopy). This is done to check for the earliest forms of colorectal cancer.  Routine screening usually begins at age 66.  Direct examination of the colon should be repeated every 5-10 years through 67 years of age. However, you may need to be screened more often if early forms of precancerous polyps or small growths are found. Skin Cancer  Check your skin from head to toe regularly.  Tell your health care provider about any new moles or changes in moles, especially if there is a change in a mole's shape or color.  Also tell your health care provider if you have a mole that is larger than the size of a pencil eraser.  Always use sunscreen. Apply sunscreen liberally and repeatedly throughout the day.  Protect yourself by wearing long sleeves, pants, a wide-brimmed hat, and sunglasses whenever you are outside. HEART DISEASE, DIABETES, AND HIGH BLOOD PRESSURE   High blood pressure causes heart disease and increases the risk of stroke. High  blood pressure is more likely to develop in:  People who have blood pressure in the high end of the normal range (130-139/85-89 mm Hg).  People who are overweight or obese.  People who are African American.  If you are 22-52 years of age, have your blood pressure checked every 3-5 years. If you are 63 years of age or older, have your blood pressure checked every year. You should have your blood pressure measured twice--once when you are at a hospital or clinic, and once when you are not at a hospital or clinic. Record the average of the two measurements. To check your blood pressure when you are not at a hospital or clinic, you can use:  An automated blood pressure machine at a pharmacy.  A home blood pressure monitor.  If you are between 69 years and 31 years old, ask your health care provider if you should take aspirin to prevent strokes.  Have regular diabetes screenings. This involves taking a blood sample to check your fasting blood sugar level.  If you are at a normal weight and have a low risk for diabetes, have this test once every three years after 67 years of age.  If you are overweight and have a high risk for diabetes, consider being tested at a younger age or more often. PREVENTING INFECTION  Hepatitis B  If you have a higher risk for hepatitis B, you should be screened for this virus. You are considered at high risk for hepatitis B if:  You were born in a country where hepatitis B is common. Ask your health care provider which countries are considered high risk.  Your parents were born in a high-risk country, and you have not been immunized against hepatitis B (hepatitis B vaccine).  You have HIV or AIDS.  You use needles to inject street drugs.  You live with someone who has hepatitis B.  You have had sex with someone who has hepatitis B.  You get hemodialysis treatment.  You take certain medicines for conditions, including cancer, organ transplantation, and  autoimmune conditions. Hepatitis C  Blood testing is recommended for:  Everyone born from [birth year] through 1967.  Anyone with known risk factors for hepatitis C. Sexually transmitted infections (STIs)  You should be screened for sexually transmitted infections (STIs) including gonorrhea and chlamydia if:  You are sexually active and are younger than 67 years of age.  You are older than 67 years of age and your health care provider tells you that you are at risk for this type of infection.  Your sexual activity has changed since you were last screened and you are at an increased risk for chlamydia or gonorrhea. Ask your health care provider if you are at risk.  If you do not have HIV, but are at risk, it may be recommended that you take a prescription medicine daily to prevent HIV infection. This is called pre-exposure prophylaxis (PrEP). You are considered at risk if:  You are sexually active and do not regularly use condoms or know the HIV status of your partner(s).  You take drugs by injection.  You are sexually active with a partner who has HIV. Talk with your health care provider about whether you are at high risk of being infected with HIV. If you choose to begin PrEP, you should first be tested for HIV. You should then be tested every 3 months for as long as you are taking PrEP.  PREGNANCY   If you are premenopausal and you may become pregnant, ask your health care provider about preconception counseling.  If you may become pregnant, take 400 to 800 micrograms (mcg) of folic acid every day.  If you want to prevent pregnancy, talk to  your health care provider about birth control (contraception). OSTEOPOROSIS AND MENOPAUSE   Osteoporosis is a disease in which the bones lose minerals and strength with aging. This can result in serious bone fractures. Your risk for osteoporosis can be identified using a bone density scan.  If you are 73 years of age or older, or if you are at risk  for osteoporosis and fractures, ask your health care provider if you should be screened.  Ask your health care provider whether you should take a calcium or vitamin D supplement to lower your risk for osteoporosis.  Menopause may have certain physical symptoms and risks.  Hormone replacement therapy may reduce some of these symptoms and risks. Talk to your health care provider about whether hormone replacement therapy is right for you.  HOME CARE INSTRUCTIONS   Schedule regular health, dental, and eye exams.  Stay current with your immunizations.   Do not use any tobacco products including cigarettes, chewing tobacco, or electronic cigarettes.  If you are pregnant, do not drink alcohol.  If you are breastfeeding, limit how much and how often you drink alcohol.  Limit alcohol intake to no more than 1 drink per day for nonpregnant women. One drink equals 12 ounces of beer, 5 ounces of wine, or 1 ounces of hard liquor.  Do not use street drugs.  Do not share needles.  Ask your health care provider for help if you need support or information about quitting drugs.  Tell your health care provider if you often feel depressed.  Tell your health care provider if you have ever been abused or do not feel safe at home.   This information is not intended to replace advice given to you by your health care provider. Make sure you discuss any questions you have with your health care provider.   Document Released: 03/21/2011 Document Revised: 09/26/2014 Document Reviewed: 08/07/2013 Elsevier Interactive Patient Education [employer].

## 2016-05-04 ENCOUNTER — Other Ambulatory Visit: Payer: Self-pay

## 2016-05-04 DIAGNOSIS — Z780 Asymptomatic menopausal state: Secondary | ICD-10-CM

## 2016-05-25 ENCOUNTER — Other Ambulatory Visit: Payer: Self-pay | Admitting: Neurology

## 2016-06-20 ENCOUNTER — Other Ambulatory Visit: Payer: Self-pay | Admitting: Neurology

## 2016-07-20 ENCOUNTER — Other Ambulatory Visit: Payer: Self-pay | Admitting: Neurology

## 2016-08-01 ENCOUNTER — Other Ambulatory Visit: Payer: Self-pay | Admitting: Neurology

## 2016-08-31 ENCOUNTER — Encounter: Payer: Self-pay | Admitting: Neurology

## 2016-08-31 ENCOUNTER — Ambulatory Visit (INDEPENDENT_AMBULATORY_CARE_PROVIDER_SITE_OTHER): Payer: Commercial Managed Care - HMO | Admitting: Neurology

## 2016-08-31 VITALS — BP 100/70 | HR 95 | Ht 62.0 in | Wt 195.6 lb

## 2016-08-31 DIAGNOSIS — M5030 Other cervical disc degeneration, unspecified cervical region: Secondary | ICD-10-CM

## 2016-08-31 DIAGNOSIS — G4486 Cervicogenic headache: Secondary | ICD-10-CM

## 2016-08-31 DIAGNOSIS — R51 Headache: Secondary | ICD-10-CM

## 2016-08-31 NOTE — Progress Notes (Signed)
NEUROLOGY FOLLOW UP OFFICE NOTE  Ashley Moore [id number]  HISTORY OF PRESENT ILLNESS: Ashley Moore is a 65 year old right-handed woman with degenerative arthritis of the cervical spine who follows up for cervicogenic headache.  Recent history obtained by patient and Dr. Thompson Caul notes.  Imaging of brain and cervical MRI reviewed.   UPDATE:  Headaches are much improved.  Neck pain is improved.  She only takes gabapentin as needed but takes tizanidine 1/2 tablet in AM, 1/2 tablet in afternoon and 1 tablet at bedtime.  She takes 1/2 meloxicam daily.  She also does exercises, started Mediterranean diet and increased water intake.  She still has stress, which contributes to ongoing neck pain.  HISTORY: On 10/28/14, her husband fell.  When she tried to get him up, she developed this severe pain in the back of her head.  She didn't notice pulling her neck.  She has had a constant occipital headache since then.  It is located on the left side.  When she moves her neck or head slightly in any direction, she feels a burning shooting pain up to the front of the head and down the left side of her neck to the shoulder.  She denies any numbness or tingling in the back of the head or in the extremities.  She denies visual symptoms, nausea, photophobia, phonophobia or dizziness.  Besides head movement, stress also exacerbates it.  Applying pressure to the area seems to help.  She has suffered the loss of both her husband and sister in the past several weeks.  More recently, she notes cracking in her neck when she moves.     Treatment:  Tylenol, heating pad, ice, massage (only briefly helps for 10 minutes), OMM (helpful)   MRI of the brain with and without contrast from 06/15/15 showed no acute intracranial process, but did reveal degenerative changes at left C1-2.  MRI of cervical spine from 08/07/15 revealed showed moderate bilateral neural foraminal stenosis at C5, C6 and C7 nerve levels.  There was  also degenerative left C1-C2 articulation with acute marrow edema and involving the central C2 odontoid.  Follow up CT of cervical spine verified extensive C1-2 degenerative changes.  PAST MEDICAL HISTORY: Past Medical History:  Diagnosis Date  . Bowen's disease    excised 1992  . Headache   . Hx of varicella     MEDICATIONS: Current Outpatient Prescriptions on File Prior to Visit  Medication Sig Dispense Refill  . aspirin 81 MG tablet Take 81 mg by mouth daily.    . meloxicam (MOBIC) 15 MG tablet TAKE 1 TABLET EVERY DAY 90 tablet 0  . Multiple Vitamin (MULTIVITAMIN) tablet Take 1 tablet by mouth daily.     Marland Kitchen tiZANidine (ZANAFLEX) 4 MG tablet TAKE 1 TABLET EVERY 8 HOURS AS NEEDED FOR MUSCLE SPASM(S) 90 tablet 0  . gabapentin (NEURONTIN) 100 MG capsule Take 1 capsule (100 mg total) by mouth 3 (three) times daily. (Patient not taking: Reported on 08/31/2016) 270 capsule 0   No current facility-administered medications on file prior to visit.     ALLERGIES: Allergies  Allergen Reactions  . Benadryl [Diphenhydramine] Other (See Comments)    Tingle all over   . Melatonin     Tingle all over   . Penicillins Rash    FAMILY HISTORY: Family History  Problem Relation Age of Onset  . Lung cancer Mother     lung  . Pancreatitis Father   . Breast cancer Sister   .  Breast cancer Maternal Grandmother     breast   . Breast cancer Maternal Aunt     breast  . Melanoma Sister     SOCIAL HISTORY: Social History   Social History  . Marital status: Married    Spouse name: N/A  . Number of children: N/A  . Years of education: N/A   Occupational History  . Not on file.   Social History Main Topics  . Smoking status: Former Research scientist (life sciences)  . Smokeless tobacco: Former Systems developer    Quit date: 09/20/1995  . Alcohol use 0.0 oz/week     Comment: occasional  . Drug use:     Types: Marijuana  . Sexual activity: No   Other Topics Concern  . Not on file   Social History Narrative   H H  of 1       5 pets.   She is a former smoker   Retired Engineer, mining fine arts   Husband smokes he has active heart disease and vascular disease   etoh   Red wine  1 per night.    Tea green tea and earl gray    Moved from [place] to [place] area in 1984/01/16   1 pregnancy   Husband died spring  2016 cv   Sister died April 17, 2015  Bone cancer                 REVIEW OF SYSTEMS: Constitutional: No fevers, chills, or sweats, no generalized fatigue, change in appetite Eyes: No visual changes, double vision, eye pain Ear, nose and throat: No hearing loss, ear pain, nasal congestion, sore throat Cardiovascular: No chest pain, palpitations Respiratory:  No shortness of breath at rest or with exertion, wheezes GastrointestinaI: No nausea, vomiting, diarrhea, abdominal pain, fecal incontinence Genitourinary:  No dysuria, urinary retention or frequency Musculoskeletal:  Neck pain Integumentary: No rash, pruritus, skin lesions Neurological: as above Psychiatric: No depression, insomnia, anxiety Endocrine: No palpitations, fatigue, diaphoresis, mood swings, change in appetite, change in weight, increased thirst Hematologic/Lymphatic:  No purpura, petechiae. Allergic/Immunologic: no itchy/runny eyes, nasal congestion, recent allergic reactions, rashes  PHYSICAL EXAM: Vitals:   08/31/16 1415  BP: 100/70  Pulse: 95   General: No acute distress.  Patient appears well-groomed.  normal body habitus. Head:  Normocephalic/atraumatic Eyes:  Fundi examined but not visualized Neck: supple, mild paraspinal tenderness, decreased range of motion Heart:  Regular rate and rhythm Lungs:  Clear to auscultation bilaterally Back: No paraspinal tenderness Neurological Exam: alert and oriented to person, place, and time. Attention span and concentration intact, recent and remote memory intact, fund of knowledge intact.  Speech fluent and not dysarthric, language intact.  CN II-XII intact. Bulk and tone  normal, muscle strength 5/5 throughout.  Sensation to light touch  intact.  Deep tendon reflexes 2+ throughout.  Finger to nose testing intact.  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  Tizanidine and gabapentin 2.  Exercise 3.  Mediterranean diet and water intake 4.  Follow up in 9 months.  16 minutes spent face to face with patient, over 50% spent discussing management.  Metta Clines, DO  CC:  Shanon Ace, MD

## 2016-08-31 NOTE — Patient Instructions (Signed)
1.  Continue tizanidine.  May use gabapentin as needed 2.  Continue neck exercises, Mediterranean diet and hydration with water 3.  Follow up in 9 months.

## 2016-09-05 ENCOUNTER — Other Ambulatory Visit: Payer: Self-pay | Admitting: Neurology

## 2016-10-04 ENCOUNTER — Other Ambulatory Visit: Payer: Self-pay | Admitting: Neurology

## 2016-10-05 ENCOUNTER — Other Ambulatory Visit: Payer: Self-pay | Admitting: Neurology

## 2016-10-17 ENCOUNTER — Telehealth: Payer: Self-pay

## 2016-10-17 NOTE — Telephone Encounter (Signed)
Pt would like additional recommendations on hemorrhoid treatments and immune system supplements.  Dr. Regis Bill - Please advise. Thanks!   Patient Name: Ashley Moore Gender: Female DOB: Feb 09, 1951 Age: 66 Y 38 M 25 D Return Phone Number: [phone] (Primary) City/State/Zip: [place] Client [facility] - Client Client Site [facility] [place] - Night Physician Shanon Ace - MD Who Is Calling Patient / Member / Family / Caregiver Call Type Triage / Clinical Caller Name Tye [place] Relationship To Patient Self Return Phone Number [phone] (Primary) Chief Complaint Pain - Generalized Reason for Call Symptomatic / Request for Health Information Initial Comment **Courtesy Call: 10:53 - no response.** CBWN: Symptoms to get better at nigt but would like a call back. Caller states that she has hemorrhoids and diverticulitis, She thinks she has had a virus for about a week now. She states that it is very painful when she uses the bathroom and wants to know if there is anything else she can take for her immune system.

## 2016-10-18 NOTE — Telephone Encounter (Signed)
No supplements recommenced t  but hydration important   Not sure if hemorrhoid or fissure  But usually use stool softener  Or miralax  One cap per day until better    sometimes =tucks and anusol may be helpful . If  You hare having fever or severe pain should make OV to check

## 2016-10-20 ENCOUNTER — Ambulatory Visit (INDEPENDENT_AMBULATORY_CARE_PROVIDER_SITE_OTHER): Payer: Medicare HMO | Admitting: Adult Health

## 2016-10-20 ENCOUNTER — Encounter: Payer: Self-pay | Admitting: Adult Health

## 2016-10-20 VITALS — BP 120/70 | Temp 98.0°F | Ht 62.0 in | Wt 195.6 lb

## 2016-10-20 DIAGNOSIS — K921 Melena: Secondary | ICD-10-CM | POA: Diagnosis not present

## 2016-10-20 MED ORDER — METRONIDAZOLE 500 MG PO TABS: 500.0000 mg | ORAL_TABLET | Freq: Three times a day (TID) | ORAL | 0 refills | Status: AC

## 2016-10-20 MED ORDER — CIPROFLOXACIN HCL 500 MG PO TABS: 500.0000 mg | ORAL_TABLET | Freq: Two times a day (BID) | ORAL | 0 refills | Status: AC

## 2016-10-20 NOTE — Addendum Note (Signed)
Addended by: Tomi Likens on: 10/20/2016 05:18 PM   Modules accepted: Orders

## 2016-10-20 NOTE — Patient Instructions (Addendum)
I have sent in a prescription for Cipro and Flagyl. Please take as directed. Do not drink alcohol while taking these medications.   If you are not feeling any better in the next 24 hours, please go to the ER   Follow up with Dr. Regis Bill in 3 days

## 2016-10-20 NOTE — Addendum Note (Signed)
Addended by: Apolinar Junes on: 10/20/2016 09:47 PM   Modules accepted: Orders

## 2016-10-20 NOTE — Progress Notes (Signed)
Subjective:    Patient ID: KENNETH DAIGLER, female    DOB: [birth year], 66 y.o.   MRN: [id number]  HPI  66 year old female who  has a past medical history of Bowen's disease; Headache; and varicella.  She presents to the office today for the reports of blood in stool and diarrhea. She reports having diarrhea for close to a month and blood in stool started about two weeks ago. She reports bright red blood in her stool in the morning and throughout the afternoon, then it switches to dark red blood in the late afternoon and evening. She does endorse clots. She states " it is a lot of blood mixed in with the stool".   She has intermittent episodes of generalized abdominal pain.   CT of abdomen in 01/01/2015 showed mild diverticulitis in proximal sigmoid colon. She feels as though this is the same symptoms she had the past time with diverticulitis.   She denies nausea or vomiting and is not feeling ill. She has not had any fevers  She may have " 20" bouts of bloody diarrhea per day    Review of Systems  Constitutional: Negative for activity change, appetite change, fatigue and fever.  HENT: Negative.   Respiratory: Negative.   Cardiovascular: Negative.   Gastrointestinal: Positive for abdominal pain (generalized ), blood in stool, constipation, diarrhea and rectal pain. Negative for abdominal distention, anal bleeding, nausea and vomiting.  Genitourinary: Negative.   Musculoskeletal: Negative.   All other systems reviewed and are negative.  Past Medical History:  Diagnosis Date  . Bowen's disease    excised 1992  . Headache   . Hx of varicella     Social History   Social History  . Marital status: Married    Spouse name: N/A  . Number of children: N/A  . Years of education: N/A   Occupational History  . Not on file.   Social History Main Topics  . Smoking status: Former Research scientist (life sciences)  . Smokeless tobacco: Former Systems developer    Quit date: 09/20/1995  . Alcohol use 0.0 oz/week     Comment:  occasional  . Drug use: Yes    Types: Marijuana  . Sexual activity: No   Other Topics Concern  . Not on file   Social History Narrative   H H  of 1      5 pets.   She is a former smoker   Retired Engineer, mining fine arts   Husband smokes he has active heart disease and vascular disease   etoh   Red wine  1 per night.    Tea green tea and earl gray    Moved from [place] to [place] area in 1984/[place]   1 pregnancy   Husband died spring  2016 cv   Sister died April 02, 2015  Bone cancer                 Past Surgical History:  Procedure Laterality Date  . BUNIONECTOMY Right   . SKIN CANCER EXCISION     bowens disease    Family History  Problem Relation Age of Onset  . Lung cancer Mother     lung  . Pancreatitis Father   . Breast cancer Sister   . Breast cancer Maternal Grandmother     breast   . Breast cancer Maternal Aunt     breast  . Melanoma Sister     Allergies  Allergen Reactions  . Benadryl [Diphenhydramine] Other (  See Comments)    Clayton Lefort all over   . Melatonin     Tingle all over   . Penicillins Rash    Current Outpatient Prescriptions on File Prior to Visit  Medication Sig Dispense Refill  . APPLE CIDER VINEGAR PO Take by mouth.    Marland Kitchen aspirin 81 MG tablet Take 81 mg by mouth daily.    . meloxicam (MOBIC) 15 MG tablet TAKE 1 TABLET EVERY DAY 90 tablet 0  . Multiple Vitamin (MULTIVITAMIN) tablet Take 1 tablet by mouth daily.     Marland Kitchen tiZANidine (ZANAFLEX) 4 MG tablet TAKE 1 TABLET EVERY 8 HOURS AS NEEDED FOR MUSCLE SPASM(S) 90 tablet 0  . gabapentin (NEURONTIN) 100 MG capsule Take 1 capsule (100 mg total) by mouth 3 (three) times daily. (Patient not taking: Reported on 10/20/2016) 270 capsule 0   No current facility-administered medications on file prior to visit.     BP 120/70   Temp 98 F (36.7 C) (Oral)   Ht 5\' 2"  (1.575 m)   Wt 195 lb 9.6 oz (88.7 kg)   BMI 35.78 kg/m       Objective:   Physical Exam  Constitutional: She is  oriented to person, place, and time. She appears well-developed and well-nourished. No distress.  Cardiovascular: Normal rate, regular rhythm, normal heart sounds and intact distal pulses.  Exam reveals no gallop and no friction rub.   No murmur heard. Pulmonary/Chest: Effort normal and breath sounds normal. No respiratory distress. She has no wheezes. She has no rales. She exhibits no tenderness.  Abdominal: Soft. Bowel sounds are normal. She exhibits no distension and no mass. There is no tenderness. There is no rebound and no guarding.  Could not reproduce pain with palpation  Genitourinary: Rectal exam shows guaiac positive stool.  Neurological: She is alert and oriented to person, place, and time.  Skin: Skin is warm and dry. No rash noted. She is not diaphoretic. No erythema. No pallor.  Psychiatric: She has a normal mood and affect. Her behavior is normal. Thought content normal.  Nursing note and vitals reviewed.     Assessment & Plan:  1. Hematochezia - Possible colitis or diverticulitis.  - She looks well. I advised to go to the ER. She was not excited with this plan. She would rather wait 24 hours to see if the antibiotics work. Will get stat CT and GI referral. Advised to go to the ER if her symptoms worsen. Follow up with Dr. Regis Bill in 3 days  - CBC with Differential/Platelet - Stool culture - C. difficile, PCR - Ambulatory referral to Gastroenterology - ciprofloxacin (CIPRO) 500 MG tablet; Take 1 tablet (500 mg total) by mouth 2 (two) times daily.  Dispense: 20 tablet; Refill: 0 - metroNIDAZOLE (FLAGYL) 500 MG tablet; Take 1 tablet (500 mg total) by mouth 3 (three) times daily.  Dispense: 30 tablet; Refill: 0 - CT Abdomen Pelvis W Contrast; Future - Basic metabolic panel  Dorothyann Peng, NP

## 2016-10-20 NOTE — Telephone Encounter (Signed)
Spoke with pt and she now states that the frequency of bowel movements has increased to many times daily and there is always bright red blood and occasional clots. Later in the afternoon she reports darker blood mixed in the stool. She is also c/o pain with bowel movement. Appt scheduled with Dorothyann Peng, NP today. Pt aware. Pt advised to go to ED if begins hemorrhaging or has BM with nothing but blood. Nothing further needed at this time.

## 2016-10-20 NOTE — Addendum Note (Signed)
Addended by: Apolinar Junes on: 10/20/2016 09:49 PM   Modules accepted: Orders

## 2016-10-20 NOTE — Addendum Note (Signed)
Addended by: Tomi Likens on: 10/20/2016 05:15 PM   Modules accepted: Orders

## 2016-10-21 ENCOUNTER — Ambulatory Visit (INDEPENDENT_AMBULATORY_CARE_PROVIDER_SITE_OTHER)
Admission: RE | Admit: 2016-10-21 | Discharge: 2016-10-21 | Disposition: A | Discharge status: Home / Self Care | Payer: Medicare HMO | Source: Ambulatory Visit | Attending: Adult Health | Admitting: Adult Health

## 2016-10-21 DIAGNOSIS — K921 Melena: Secondary | ICD-10-CM

## 2016-10-21 DIAGNOSIS — R197 Diarrhea, unspecified: Secondary | ICD-10-CM | POA: Diagnosis not present

## 2016-10-21 LAB — CBC WITH DIFFERENTIAL/PLATELET
BASOS ABS: 0 {cells}/uL (ref 0–200)
Basophils Relative: 0 %
Eosinophils Absolute: 210 cells/uL (ref 15–500)
Eosinophils Relative: 3 %
HEMATOCRIT: 35.7 % (ref 35.0–45.0)
Hemoglobin: 11.3 g/dL — ABNORMAL LOW (ref 11.7–15.5)
LYMPHS ABS: 2170 {cells}/uL (ref 850–3900)
LYMPHS PCT: 31 %
MCH: 26.7 pg — ABNORMAL LOW (ref 27.0–33.0)
MCHC: 31.7 g/dL — AB (ref 32.0–36.0)
MCV: 84.2 fL (ref 80.0–100.0)
MONO ABS: 630 {cells}/uL (ref 200–950)
MPV: 11.5 fL (ref 7.5–12.5)
Monocytes Relative: 9 %
NEUTROS PCT: 57 %
Neutro Abs: 3990 cells/uL (ref 1500–7800)
Platelets: 277 10*3/uL (ref 140–400)
RBC: 4.24 MIL/uL (ref 3.80–5.10)
RDW: 13.7 % (ref 11.0–15.0)
WBC: 7 10*3/uL (ref 3.8–10.8)

## 2016-10-21 LAB — BASIC METABOLIC PANEL
BUN: 15 mg/dL (ref 7–25)
CHLORIDE: 106 mmol/L (ref 98–110)
CO2: 26 mmol/L (ref 20–31)
Calcium: 9.4 mg/dL (ref 8.6–10.4)
Creat: 0.64 mg/dL (ref 0.50–0.99)
GLUCOSE: 88 mg/dL (ref 65–99)
POTASSIUM: 4.1 mmol/L (ref 3.5–5.3)
Sodium: 140 mmol/L (ref 135–146)

## 2016-10-21 MED ORDER — IOPAMIDOL (ISOVUE-300) INJECTION 61%
100.0000 mL | Freq: Once | INTRAVENOUS | Status: AC | PRN
  Administered 2016-10-21: 100 mL via INTRAVENOUS

## 2016-10-21 NOTE — Telephone Encounter (Signed)
Updated patient on CT results. Advised to go to the ER if her symptoms worsen over the weekend. She has an appointment with GI on Monday .   Her PCP was also updated.   Continue with abx

## 2016-10-24 ENCOUNTER — Encounter: Payer: Self-pay | Admitting: Gastroenterology

## 2016-10-24 ENCOUNTER — Ambulatory Visit (INDEPENDENT_AMBULATORY_CARE_PROVIDER_SITE_OTHER): Payer: Medicare HMO | Admitting: Gastroenterology

## 2016-10-24 ENCOUNTER — Encounter (HOSPITAL_COMMUNITY): Payer: Self-pay

## 2016-10-24 ENCOUNTER — Emergency Department (HOSPITAL_COMMUNITY)
Admission: EM | Admit: 2016-10-24 | Discharge: 2016-10-24 | Disposition: A | Discharge status: Home / Self Care | Payer: Medicare HMO | Attending: Emergency Medicine | Admitting: Emergency Medicine

## 2016-10-24 ENCOUNTER — Other Ambulatory Visit: Payer: Self-pay | Admitting: Internal Medicine

## 2016-10-24 ENCOUNTER — Other Ambulatory Visit: Payer: Self-pay

## 2016-10-24 VITALS — BP 134/72 | HR 100 | Ht 62.5 in | Wt 198.0 lb

## 2016-10-24 DIAGNOSIS — Z87891 Personal history of nicotine dependence: Secondary | ICD-10-CM | POA: Insufficient documentation

## 2016-10-24 DIAGNOSIS — K5902 Outlet dysfunction constipation: Secondary | ICD-10-CM | POA: Diagnosis not present

## 2016-10-24 DIAGNOSIS — K629 Disease of anus and rectum, unspecified: Secondary | ICD-10-CM | POA: Diagnosis not present

## 2016-10-24 DIAGNOSIS — Z7982 Long term (current) use of aspirin: Secondary | ICD-10-CM | POA: Insufficient documentation

## 2016-10-24 DIAGNOSIS — K6289 Other specified diseases of anus and rectum: Secondary | ICD-10-CM

## 2016-10-24 DIAGNOSIS — Z1231 Encounter for screening mammogram for malignant neoplasm of breast: Secondary | ICD-10-CM

## 2016-10-24 LAB — CBC WITH DIFFERENTIAL/PLATELET
BASOS ABS: 0 10*3/uL (ref 0.0–0.1)
Basophils Relative: 0 %
EOS ABS: 0.1 10*3/uL (ref 0.0–0.7)
EOS PCT: 1 %
HCT: 34.7 % — ABNORMAL LOW (ref 36.0–46.0)
Hemoglobin: 11.3 g/dL — ABNORMAL LOW (ref 12.0–15.0)
LYMPHS ABS: 1.9 10*3/uL (ref 0.7–4.0)
Lymphocytes Relative: 20 %
MCH: 27.2 pg (ref 26.0–34.0)
MCHC: 32.6 g/dL (ref 30.0–36.0)
MCV: 83.4 fL (ref 78.0–100.0)
MONO ABS: 0.8 10*3/uL (ref 0.1–1.0)
Monocytes Relative: 9 %
Neutro Abs: 6.3 10*3/uL (ref 1.7–7.7)
Neutrophils Relative %: 70 %
PLATELETS: 260 10*3/uL (ref 150–400)
RBC: 4.16 MIL/uL (ref 3.87–5.11)
RDW: 14.1 % (ref 11.5–15.5)
WBC: 9.1 10*3/uL (ref 4.0–10.5)

## 2016-10-24 LAB — COMPREHENSIVE METABOLIC PANEL
ALT: 30 U/L (ref 14–54)
ANION GAP: 9 (ref 5–15)
AST: 53 U/L — ABNORMAL HIGH (ref 15–41)
Albumin: 4.1 g/dL (ref 3.5–5.0)
Alkaline Phosphatase: 76 U/L (ref 38–126)
BUN: 19 mg/dL (ref 6–20)
CHLORIDE: 109 mmol/L (ref 101–111)
CO2: 22 mmol/L (ref 22–32)
CREATININE: 0.71 mg/dL (ref 0.44–1.00)
Calcium: 9.1 mg/dL (ref 8.9–10.3)
Glucose, Bld: 92 mg/dL (ref 65–99)
POTASSIUM: 3.9 mmol/L (ref 3.5–5.1)
SODIUM: 140 mmol/L (ref 135–145)
Total Bilirubin: 0.2 mg/dL — ABNORMAL LOW (ref 0.3–1.2)
Total Protein: 7.6 g/dL (ref 6.5–8.1)

## 2016-10-24 MED ORDER — OXYCODONE-ACETAMINOPHEN 5-325 MG PO TABS
1.0000 | ORAL_TABLET | Freq: Once | ORAL | Status: AC
  Administered 2016-10-24: 1 via ORAL
  Filled 2016-10-24: qty 1

## 2016-10-24 MED ORDER — OXYCODONE-ACETAMINOPHEN 5-325 MG PO TABS: 1.0000 | ORAL_TABLET | ORAL | 0 refills | Status: DC | PRN

## 2016-10-24 NOTE — Discharge Instructions (Signed)
Take your pain medications as prescribed as needed. Continue drinking fluids at home to remain hydrated.  You will receive a call from Dr. Marcello Moores' office this week to schedule a follow up appointment to have a biopsy preformed.  I recommend calling Dr. Loletha Carrow' office tomorrow to schedule follow up and establish referral to oncology.  Please return to the Emergency Department if symptoms worsen or new onset of fever, abdominal pain, worsening rectal pain/bleeding, unable to have a bowel movement, weakness, syncope.

## 2016-10-24 NOTE — Patient Instructions (Signed)
Please report to the ER for elvaluation of rectal pain.  If you are age 68 or older, your body mass index should be between 23-30. Your Body mass index is 35.64 kg/m. If this is out of the aforementioned range listed, please consider follow up with your Primary Care Provider.  If you are age 5 or younger, your body mass index should be between 19-25. Your Body mass index is 35.64 kg/m. If this is out of the aformentioned range listed, please consider follow up with your Primary Care Provider.   Thank you for choosing  GI  Dr Wilfrid Lund III

## 2016-10-24 NOTE — ED Triage Notes (Signed)
Pt here from MD office GI.  Pt has had diarrhea and rectal pain since January.  Pt went to md today and told she may have rectal cancer.  MD asking for surgical consult.

## 2016-10-24 NOTE — ED Notes (Signed)
EDPA Provider at bedside. 

## 2016-10-24 NOTE — Progress Notes (Signed)
[facility] Progress Note  Chief Complaint: Rectal pain and bleeding with diarrhea  Subjective  History:  This is a 66 year old woman I last saw in June 2017 for anal pain and bleeding that appeared due to a thrombosed external hemorrhoid. We made her an appointment of the surgical clinic very shortly after that, I do not seem to have that report today. She cannot recall which surgeon she saw at [facility], but she was told to use sitz baths. It seems that she continued to have rectal pain with intermittent bleeding and not seek care again until just last week. She is somewhat unclear on whether this problem was bothering her between June and last month, but says that just after new year she got an acute GI viral infection. [employer] noticing more pain and bleeding. She says she is having "diarrhea" by which she means up to 20 tiny liquid stools per day. She has having persistent bleeding and constant agonizing rectal pain. She was seen by primary care last week and a CT scan revealed a distal rectal mass with adjacent adenopathy.  ROS: Cardiovascular:  no chest pain Respiratory: no dyspnea  The patient's Past Medical, Family and Social History were reviewed and are on file in the EMR.  Objective:  Med list reviewed  Vital signs in last 24 hrs: Vitals:   10/24/16 1559  BP: 134/72  Pulse: 100    Physical Exam  She is in agonizing pain  HEENT: sclera anicteric, oral mucosa moist without lesions  Neck: supple, no thyromegaly, JVD or lymphadenopathy  Cardiac: RRR without murmurs, S1S2 heard, no peripheral edema  Pulm: clear to auscultation bilaterally, normal RR and effort noted  Abdomen: soft, No tenderness, with active bowel sounds. No guarding or palpable hepatosplenomegaly. No distention  Skin; warm and dry, no jaundice or rash Rectal:  Large, circumferential, firm mass protruding from anus. Lumen almost obliterated, cannot do DRE. Very tender  Recent Labs:  CBC Latest Ref Rng &  Units 10/20/2016 02/18/2016 05/05/2015  WBC 3.8 - 10.8 K/uL 7.0 - 13.0(H)  Hemoglobin 11.7 - 15.5 g/dL 11.3(L) 12.5 12.6  Hematocrit 35.0 - 45.0 % 35.7 - 38.3  Platelets 140 - 400 K/uL 277 - 204     Radiologic studies: Images from the CT abdomen and pelvis from last week were personally reviewed.   @ASSESSMENTPLANBEGIN @ Assessment: Encounter Diagnoses  Name Primary?  . Rectal pain Yes  . Constipation due to outlet dysfunction   . Rectal mass     Appears to be advanced anal cancer.  Plan: Admission to [facility] for pain control and surgical eval for EUA, tissue Dx and oncology eval.  Total time 30 minutes, over half spent in counseling and coordination of care.   Nelida Meuse III

## 2016-10-24 NOTE — ED Provider Notes (Signed)
[facility] DEPT Provider Note   CSN: [id number] Arrival date & time: 10/24/16  1719     History   Chief Complaint Chief Complaint  Patient presents with  . Rectal Pain  . rectal mass    HPI Ashley Moore is a 66 y.o. female.  HPI   Patient is a 66 year old female who presents the ED with complaint of rectal pain. Patient reports she was seen by her GI provider this afternoon for her continued rectal pain and diarrhea with reported blood in stool which she states she had been having for the past month. Patient states she was told by Dr. Loletha Carrow that it appeared she had anal cancer which resulted in her coming to the ED for further management and evaluation. Chart review shows patient was initially seen in June 2017 for anal pain and bleeding which appeared to be treated to thrombosed external hemorrhoid. Patient followed up with surgical clinic shortly after but was told to use sitz baths. Patient reports the beginning of January she had the normal virus and noticed gradually worsening pain and bleeding. CT scan performed last week by her PCP revealed distal rectal mass with adjacent adenopathy. Patient reports she has been having more than 20 small liquid stools per day. She reports having rectal bleeding and worsening rectal pain with BMs. Endorses associated chills and generalized body ache. Patient reports she has been taking ibuprofen at home with intermittent relief.  PCP- Dr. Regis Bill GI- Dr. Loletha Carrow Velora Heckler)  Past Medical History:  Diagnosis Date  . Bowen's disease    excised 1992  . Headache   . Hx of varicella     Patient Active Problem List   Diagnosis Date Noted  . Cystitis 05/06/2015  . Degenerative cervical disc 04/08/2015  . Trigger point of neck 03/26/2015  . Pain in joint, ankle and foot 04/23/2013  . Visit for preventive health examination 02/13/2013  . Routine gynecological examination 02/13/2013  . Family history of breast cancer in first degree  relative 02/13/2013  . Rash and nonspecific skin eruption 02/13/2013  . 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 VITAMIN D DEFICIENCY 06/25/2008  . NECK PAIN 06/02/2008  . FOOT PAIN 06/02/2008  . OSTEOPENIA 06/02/2008    Past Surgical History:  Procedure Laterality Date  . BUNIONECTOMY Right   . SKIN CANCER EXCISION     bowens disease    OB History    No data available       Home Medications    Prior to Admission medications   Medication Sig Start Date End Date Taking? Authorizing Provider  APPLE CIDER VINEGAR PO Take 1,000 mg by mouth daily.   Yes Historical Provider, MD  aspirin 81 MG tablet Take 81 mg by mouth daily.   Yes Historical Provider, MD  ciprofloxacin (CIPRO) 500 MG tablet Take 1 tablet (500 mg total) by mouth 2 (two) times daily. 10/20/16 10/30/16 Yes Dorothyann Peng, NP  docusate sodium (COLACE) 100 MG capsule Take 100 mg by mouth daily as needed for mild constipation.   Yes Historical Provider, MD  ibuprofen (ADVIL,MOTRIN) 200 MG tablet Take 600 mg by mouth every 6 (six) hours as needed for moderate pain.   Yes Historical Provider, MD  meloxicam (MOBIC) 15 MG tablet TAKE 1 TABLET EVERY DAY Patient taking differently: TAKE 1 TABLET PO EVERY DAY AS NEEDED FOR PAIN 10/07/16  Yes Pieter Partridge, DO  metroNIDAZOLE (FLAGYL) 500 MG tablet Take 1 tablet (500 mg total) by mouth 3 (three) times daily. 10/20/16 10/30/16  Yes Dorothyann Peng, NP  Multiple Vitamin (MULTIVITAMIN) tablet Take 1 tablet by mouth daily.    Yes Historical Provider, MD  simethicone (MYLICON) 0000000 MG chewable tablet Chew 125 mg by mouth every 6 (six) hours as needed for flatulence.   Yes Historical Provider, MD  tiZANidine (ZANAFLEX) 4 MG tablet TAKE 1 TABLET EVERY 8 HOURS AS NEEDED FOR MUSCLE SPASM(S) 10/07/16  Yes Pieter Partridge, DO  gabapentin (NEURONTIN) 100 MG capsule Take 1 capsule (100 mg total) by mouth 3 (three) times daily. Patient not taking: Reported on 10/24/2016 02/24/16   Pieter Partridge, DO  oxyCODONE-acetaminophen  (PERCOCET/ROXICET) 5-325 MG tablet Take 1 tablet by mouth every 4 (four) hours as needed for severe pain. 10/24/16   Nona Dell, PA-C    Family History Family History  Problem Relation Age of Onset  . Lung cancer Mother     lung  . Pancreatitis Father   . Breast cancer Sister   . Breast cancer Maternal Grandmother     breast   . Breast cancer Maternal Aunt     breast  . Melanoma Sister     Social History Social History  Substance Use Topics  . Smoking status: Former Research scientist (life sciences)  . Smokeless tobacco: Former Systems developer    Quit date: 09/20/1995  . Alcohol use 0.0 oz/week     Comment: occasional     Allergies   Benadryl [diphenhydramine]; Melatonin; and Penicillins   Review of Systems Review of Systems  Constitutional: Positive for chills.  Gastrointestinal: Positive for blood in stool, diarrhea and rectal pain.  Musculoskeletal: Positive for myalgias (generalized).  All other systems reviewed and are negative.    Physical Exam Updated Vital Signs BP 144/83 (BP Location: Right Arm)   Pulse 78   Temp 98.3 F (36.8 C) (Oral)   Resp 16   SpO2 100%   Physical Exam  Constitutional: She is oriented to person, place, and time. She appears well-developed and well-nourished. No distress.  HENT:  Head: Normocephalic and atraumatic.  Mouth/Throat: Uvula is midline, oropharynx is clear and moist and mucous membranes are normal. No oropharyngeal exudate, posterior oropharyngeal edema, posterior oropharyngeal erythema or tonsillar abscesses. No tonsillar exudate.  Eyes: Conjunctivae and EOM are normal. Right eye exhibits no discharge. Left eye exhibits no discharge. No scleral icterus.  Neck: Normal range of motion. Neck supple.  Cardiovascular: Normal rate, regular rhythm, normal heart sounds and intact distal pulses.   Pulmonary/Chest: Effort normal and breath sounds normal. No respiratory distress. She has no wheezes. She has no rales. She exhibits no tenderness.  Abdominal:  Soft. Bowel sounds are normal. She exhibits no distension and no mass. There is no tenderness. There is no rebound and no guarding. No hernia.  Genitourinary: Rectal exam shows mass and tenderness.  Genitourinary Comments: Large protruding anal mass, TTP. Unable to perform DRE due to mass and tenderness. No gross blood noted on exam.  Musculoskeletal: She exhibits no edema.  Neurological: She is alert and oriented to person, place, and time.  Skin: Skin is warm and dry. She is not diaphoretic.  Nursing note and vitals reviewed.      ED Treatments / Results  Labs (all labs ordered are listed, but only abnormal results are displayed) Labs Reviewed  CBC WITH DIFFERENTIAL/PLATELET - Abnormal; Notable for the following:       Result Value   Hemoglobin 11.3 (*)    HCT 34.7 (*)    All other components within normal limits  COMPREHENSIVE METABOLIC  PANEL - Abnormal; Notable for the following:    AST 53 (*)    Total Bilirubin 0.2 (*)    All other components within normal limits  PROTIME-INR    EKG  EKG Interpretation None       Radiology No results found.  Procedures Procedures (including critical care time)  Medications Ordered in ED Medications  oxyCODONE-acetaminophen (PERCOCET/ROXICET) 5-325 MG per tablet 1 tablet (1 tablet Oral Given 10/24/16 2049)     Initial Impression / Assessment and Plan / ED Course  I have reviewed the triage vital signs and the nursing notes.  Pertinent labs & imaging results that were available during my care of the patient were reviewed by me and considered in my medical decision making (see chart for details).     Patient presents from GI clinic with anal mass suspected to be cancer. Patient reports having gradually worsening rectal pain with associated diarrhea and rectal bleeding for the past month. Denies fever or abdominal pain. VSS. Exam revealed large protruding anal mass, TTP. Unable to perform DRE due to mass and tenderness. No gross  blood noted on exam. Remaining exam unremarkable. Hgb 11.3. Remaining labs stable. Chart review shows CT abdomen performed on 10/21/16 "Thickening of the anus, extending to the junction between the anus and rectum with a large stool ball in the rectum and mild fat stranding posterior to the rectum. This is likely the cause of the patient's symptoms. These findings could be seen with anal carcinoma resulting in partial obstruction causing the stool ball and possible developing stercoral colitis. However, inflammatory causes are not excluded. Adenopathy in the inguinal regions could be metastatic or reactive depending on the cause of anal thickening. Shotty nodes posterior to the rectum are nonspecific." Consulted surgery. Dr. Dalbert Batman advised that due to pt remaining hemodynamically stable and without total obstruction, pt is appropriate to have outpatient follow up this week for anal biopsy. He reports he will message Dr. Marcello Moores and ask her office to call the pt this week to set up follow up appointment this week for biopsy and advised to set up oncology referral. Discussed results and plan for outpatient management with pt. Pt agrees with plan and has remained hemodynamically stable while in the ED. Plan to d/c pt home with pain meds and outpatient follow up. Discussed strict return precautions.   Final Clinical Impressions(s) / ED Diagnoses   Final diagnoses:  Rectal mass    New Prescriptions New Prescriptions   OXYCODONE-ACETAMINOPHEN (PERCOCET/ROXICET) 5-325 MG TABLET    Take 1 tablet by mouth every 4 (four) hours as needed for severe pain.     Chesley Noon Frazer, [place] 10/24/16 2054    Gwenyth Allegra Tegeler, MD 10/25/16 [phone]

## 2016-10-25 ENCOUNTER — Telehealth: Payer: Self-pay | Admitting: Gastroenterology

## 2016-10-25 ENCOUNTER — Telehealth: Payer: Self-pay | Admitting: Internal Medicine

## 2016-10-25 NOTE — Telephone Encounter (Signed)
Spoke to [facility] and to patient, appointment is scheduled with Dr. Marcello Moores for 2/13. I explained that the first step is to see the surgeon and a biopsy will be done to confirm the diagnosis. After that the referral would be done to any other specialist office. Patient was grateful for the follow up, told her that if she had any questions or concerns to call.

## 2016-10-25 NOTE — Telephone Encounter (Addendum)
Error

## 2016-10-25 NOTE — Telephone Encounter (Signed)
According to the ED notes, she will see surgery this week for exam and biopsy.  When that is done, oncology referral would follow because we need to confirm the diagnosis first.  Surgery will be taking the lead on all this. Please help her out and call [facility] to expedite her appointment for this week.  The ED notes said the surgeon on call would speak to his partner Dr Marcello Moores (colo-rectal surgeon).  Still, it would help Ashley Moore if we call them to make sure it gets done.

## 2016-10-25 NOTE — Telephone Encounter (Signed)
I see that she was sent home from ED last night. Please advise on referrals, thank you.

## 2016-10-26 ENCOUNTER — Telehealth: Payer: Self-pay | Admitting: Gastroenterology

## 2016-10-26 NOTE — Telephone Encounter (Signed)
Routed to Dr. Danis. 

## 2016-10-26 NOTE — Telephone Encounter (Signed)
Patient states that she tried taking the percocet and it made her very nauseated. She said that they did not give her anything for nausea. Please advise.

## 2016-10-26 NOTE — Telephone Encounter (Signed)
I am so sorry to hear that she is having such a hard time.  I do not think that the oral pain meds I am licensed to prescribe are likely to control this pain, especially without treatment of the underlying condition.  That is exactly why I sent her to the ED 2 days ago and asked them to admit her for IV pain medication and expedited diagnostic testing and treatment.  If 2 tablets of those pain meds every 6 hours is not helping with pain and surgery cannot see her this week, then I must recommend return to ED.  I am very concerned about her, but I am afraid that is as helpful as I can be at this point.

## 2016-10-26 NOTE — Progress Notes (Signed)
Pre visit review using our clinic review tool, if applicable. No additional management support is needed unless otherwise documented below in the visit note.  Chief Complaint  Patient presents with  . Follow-up    HPI: Ashley Moore 66 y.o. 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  See past history, s he saw my colleague in my absence for rectal bleeding and frequent diarrhea evaluation showed a normal CT scan and rectoanal masses that was recommended to biopsy. She was seen in the emergency room because of concern about obstruction and anemia but was felt to be hemodynamically stable hg 11.3. It was recommended she get an outpatient surgical biopsy of the area and see oncology.  She has an appointment next week  with Dr. Marcello Moores to have a biopsy and then plan follow-up.  She tried a half of the oxycodone immediate her very sick and nauseous. But she still has severe pain. She had a very large bowel movement yesterday that took her hours and was very painful. Half of the low back made her drowsy. She is still bleeding. But not dizzy or faint.  No vomiting. She is taking a stool softener with some help.  She is also noted in the last month or so some bumps lesions on her left labia near the vagina over the last few weeks she states that they are growing fairly rapidly without associated pain. No UTI symptoms although uncertain if she could have blood in the area related to the rectal bleeding.  She is still taking aspirin.   ROS: See pertinent positives and negatives per HPI. Has had some tingling in her fingertips most recently but not dated to any motion no numbness or tingling in her feet. She is under care for headaches and neck pain. No weakness. She doesn't have a GYN    No respiratory sx  Denies any significant vaginal symptoms.  Past Medical History:  Diagnosis Date  . Bowen's disease    excised 1992  .  Headache   . Hx of varicella     Family History  Problem Relation Age of Onset  . Lung cancer Mother     lung  . Pancreatitis Father   . Breast cancer Sister   . Breast cancer Maternal Grandmother     breast   . Breast cancer Maternal Aunt     breast  . Melanoma Sister     Social History   Social History  . Marital status: Widowed    Spouse name: N/A  . Number of children: 0  . Years of education: N/A   Social History Main Topics  . Smoking status: Former Research scientist (life sciences)  . Smokeless tobacco: Former Systems developer    Quit date: 09/20/1995  . Alcohol use 0.0 oz/week     Comment: occasional  . Drug use: Yes    Types: Marijuana  . Sexual activity: No   Other Topics Concern  . None   Social History Narrative   H H  of 1      5 pets.   She is a former smoker   Retired Engineer, mining fine arts   Husband smokes he has active heart disease and vascular disease   etoh   Red wine  1 per night.    Tea green tea and earl gray    Moved from [place] to [place] area in 01/14/84   1 pregnancy   Husband died spring  2016  cv   Sister died June 2016  Bone cancer                 Outpatient Medications Prior to Visit  Medication Sig Dispense Refill  . APPLE CIDER VINEGAR PO Take 1,000 mg by mouth daily.    Marland Kitchen aspirin 81 MG tablet Take 81 mg by mouth daily.    . ciprofloxacin (CIPRO) 500 MG tablet Take 1 tablet (500 mg total) by mouth 2 (two) times daily. 20 tablet 0  . docusate sodium (COLACE) 100 MG capsule Take 100 mg by mouth daily as needed for mild constipation.    . gabapentin (NEURONTIN) 100 MG capsule Take 1 capsule (100 mg total) by mouth 3 (three) times daily. (Patient not taking: Reported on 10/24/2016) 270 capsule 0  . ibuprofen (ADVIL,MOTRIN) 200 MG tablet Take 600 mg by mouth every 6 (six) hours as needed for moderate pain.    . meloxicam (MOBIC) 15 MG tablet TAKE 1 TABLET EVERY DAY (Patient taking differently: TAKE 1 TABLET PO EVERY DAY AS NEEDED FOR PAIN) 90 tablet 0    . metroNIDAZOLE (FLAGYL) 500 MG tablet Take 1 tablet (500 mg total) by mouth 3 (three) times daily. 30 tablet 0  . Multiple Vitamin (MULTIVITAMIN) tablet Take 1 tablet by mouth daily.     . ondansetron (ZOFRAN) 4 MG tablet Take 1 tablet (4 mg total) by mouth every 8 (eight) hours as needed for nausea or vomiting. 30 tablet 0  . oxyCODONE-acetaminophen (PERCOCET/ROXICET) 5-325 MG tablet Take 1 tablet by mouth every 4 (four) hours as needed for severe pain. 15 tablet 0  . simethicone (MYLICON) 0000000 MG chewable tablet Chew 125 mg by mouth every 6 (six) hours as needed for flatulence.    Marland Kitchen tiZANidine (ZANAFLEX) 4 MG tablet TAKE 1 TABLET EVERY 8 HOURS AS NEEDED FOR MUSCLE SPASM(S) 90 tablet 0   No facility-administered medications prior to visit.      EXAM:  BP 116/72 (BP Location: Left Arm, Patient Position: Sitting, Cuff Size: Large)   Temp 97.7 F (36.5 C) (Oral)   Ht 5' 2.5" (1.588 m)   Wt 202 lb (91.6 kg)   BMI 36.36 kg/m   Body mass index is 36.36 kg/m.  GENERAL: vitals reviewed and listed above, alert, oriented, appears well hydrated and in no acute distress HEENT: atraumatic, conjunctiva  clear, no obvious abnormalities on inspection of external nose and ears NECK: no obvious masses on inspection palpation  Skin: normal capillary refill ,turgor , color: No acute rashes ,petechiae or bruising LUNGS: clear to auscultation bilaterally, no wheezes, rales or rhonchi,  CV: HRRR, no clubbing cyanosis or  peripheral edema nl cap refill upper extremity pulses are normal no obvious neurologic deficits in her hands. Abdomen soft although some tenderness in the very low pelvic area. External rectal area with multiple polypoid smooth pink lesions. External GU left labia with 3 skin lumps similar color and texture as the rectal area. Internal bimanual do not feel a mass inside the vagina or specific painful mass areas. MS: moves all extremities without noticeable focal  abnormality PSYCH:  pleasant and cooperative, Lab Results  Component Value Date   WBC 9.1 10/24/2016   HGB 11.3 (L) 10/24/2016   HCT 34.7 (L) 10/24/2016   PLT 260 10/24/2016   GLUCOSE 92 10/24/2016   CHOL 165 03/11/2015   TRIG 66.0 03/11/2015   HDL 67.30 03/11/2015   LDLCALC 85 03/11/2015   ALT 30 10/24/2016   AST 53 (H) 10/24/2016  NA 140 10/24/2016   K 3.9 10/24/2016   CL 109 10/24/2016   CREATININE 0.71 10/24/2016   BUN 19 10/24/2016   CO2 22 10/24/2016   TSH 4.09 10/21/2015   HGBA1C 5.5 05/11/2015    ASSESSMENT AND PLAN:  Discussed the following assessment and plan:  Lower abdominal pain - Most likely related to evacuation painful bowel movements see above. 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  Rectal anomass  Labial lesions left  new - Concerning for neoplasm patient states these grew fairly quickly over the last month. We'll contact surgery office see if this area can also be biopsied.  Rectal bleeding  Need for prophylactic vaccination and inoculation against influenza - Plan: Flu vaccine HIGH DOSE PF (Fluzone High dose)  Adverse effect of drug, initial encounter - Oxycodone severe nausea could still try with Zofran prescription given for tramadol expectant management lower fiber diet increase fluids Difficult with pain control expectant management get input from surgery and and specialist about pain control. No other alarm symptoms today . Told her to stop the aspirin discussed risk benefit of medication also encouraged her to message also my chart for phone calls about help with coordination of care symptom relief etc. 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 I don't think the numbness in the fingertips is related at all to her current predicament although could be local phenomenon or from her neck non-alarming at this time. She may  Ask dr Tomi Likens in this regard.  Total visit 40 mins > 50% spent counseling and coordinating care as indicated in above note and in  instructions to patient .   -Patient advised to return or notify health care team  if symptoms worsen ,persist or new concerns arise. In the interim.   Patient Instructions   Flu vaccine today . Cont stool  softerner .push liquids  For hydration  Stop the asa  Can add to bleeding .   Can stop the oycodone  And  Not to take with other pain pill s  Can try a different pain med? If needed.?  Tramadol  Proceed with biopsy  May need to bx the vaginal area also   (Or get you to a gyne to check this ) Dr Marcello Moores should be able to do this.  Need to fget the bx results and get you to  Multidisciplinary oncology evaluation for appropriate treatment   Can wait to do the hepatitic C  Screen when you get your next blood draw but please ask  orderer to add this on to  You may need a Ct scan  Of chest  For assessment also and other evaluation before treatment.  Usually surgery will do the referral but if not done then we can help .    BP Readings from Last 3 Encounters:  10/27/16 116/72  10/24/16 144/83  10/24/16 134/72           Wanda K. Panosh M.D.

## 2016-10-27 ENCOUNTER — Encounter: Payer: Self-pay | Admitting: Internal Medicine

## 2016-10-27 ENCOUNTER — Other Ambulatory Visit: Payer: Self-pay

## 2016-10-27 ENCOUNTER — Ambulatory Visit (INDEPENDENT_AMBULATORY_CARE_PROVIDER_SITE_OTHER): Payer: Medicare HMO | Admitting: Internal Medicine

## 2016-10-27 VITALS — BP 116/72 | Temp 97.7°F | Ht 62.5 in | Wt 202.0 lb

## 2016-10-27 DIAGNOSIS — T50905A Adverse effect of unspecified drugs, medicaments and biological substances, initial encounter: Secondary | ICD-10-CM

## 2016-10-27 DIAGNOSIS — K625 Hemorrhage of anus and rectum: Secondary | ICD-10-CM | POA: Diagnosis not present

## 2016-10-27 DIAGNOSIS — K629 Disease of anus and rectum, unspecified: Secondary | ICD-10-CM

## 2016-10-27 DIAGNOSIS — K6289 Other specified diseases of anus and rectum: Secondary | ICD-10-CM

## 2016-10-27 DIAGNOSIS — R1030 Lower abdominal pain, unspecified: Secondary | ICD-10-CM

## 2016-10-27 DIAGNOSIS — Z23 Encounter for immunization: Secondary | ICD-10-CM | POA: Diagnosis not present

## 2016-10-27 DIAGNOSIS — T887XXA Unspecified adverse effect of drug or medicament, initial encounter: Secondary | ICD-10-CM

## 2016-10-27 DIAGNOSIS — N9089 Other specified noninflammatory disorders of vulva and perineum: Secondary | ICD-10-CM

## 2016-10-27 MED ORDER — TRAMADOL HCL 50 MG PO TABS: 25.0000 mg | ORAL_TABLET | Freq: Four times a day (QID) | ORAL | 0 refills | Status: DC | PRN

## 2016-10-27 MED ORDER — ONDANSETRON HCL 4 MG PO TABS: 4.0000 mg | ORAL_TABLET | Freq: Three times a day (TID) | ORAL | 0 refills | Status: DC | PRN

## 2016-10-27 NOTE — Telephone Encounter (Signed)
Absolutely. Ondansetron (generic zofran) , 4 mg, one tablet every 6 hours as needed for nausea.  Please call [facility], as I feel this woman needs to be seen by Dr Marcello Moores today or tomorrow.  That was what they offered when the ED called them Monday.  I am in clinic all day today and can talk to Dr Leighton Ruff if needed to facilitate.

## 2016-10-27 NOTE — Patient Instructions (Addendum)
Flu vaccine today . Cont stool  softerner .push liquids  For hydration  Stop the asa  Can add to bleeding .   Can stop the oycodone  And  Not to take with other pain pill s  Can try a different pain med? If needed.?  Tramadol  Proceed with biopsy  May need to bx the vaginal area also   (Or get you to a gyne to check this ) Dr Marcello Moores should be able to do this.  Need to fget the bx results and get you to  Multidisciplinary oncology evaluation for appropriate treatment   Can wait to do the hepatitic C  Screen when you get your next blood draw but please ask  orderer to add this on to  You may need a Ct scan  Of chest  For assessment also and other evaluation before treatment.  Usually surgery will do the referral but if not done then we can help .    BP Readings from Last 3 Encounters:  10/27/16 116/72  10/24/16 144/83  10/24/16 134/72

## 2016-10-27 NOTE — Telephone Encounter (Signed)
Spoke to a nurse over at [facility], Dr. Marcello Moores is in surgery today and tomorrow, she only sees patients on Monday and Tuesday. I asked if someone else could at least see her, none are available. Patient is at her PCP office now. Let her know to pick up the zofran so that she can easily take her pain medication. Let me know if you want me to have them send a message to Dr. Marcello Moores to give you a call today.

## 2016-10-28 ENCOUNTER — Telehealth: Payer: Self-pay | Admitting: Internal Medicine

## 2016-10-28 NOTE — Telephone Encounter (Signed)
[facility]  Patient Name: Ashley Moore  DOB: 08/09/1951    Initial Comment Caller states she saw doctor yesterday and she received Tramadol for pain and now she is having nausea.    Nurse Assessment  Nurse: Ardine Bjork, RN, Melissa Date/Time (Eastern Time): 10/28/2016 3:15:40 PM  Confirm and document reason for call. If symptomatic, describe symptoms. ---Caller states she saw doctor yesterday and she received Tramadol for pain and now she is having nausea. States has not started Tramadol yet because she felt so good. 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 No pain since changing to soft diet and has had 6 BMs. Nausea started 10am-constant-denies vomiting.  Does the patient have any new or worsening symptoms? ---Yes  Will a triage be completed? ---Yes  Related visit to physician within the last 2 weeks? ---Yes  Does the PT have any chronic conditions? (i.e. diabetes, asthma, etc.) ---Yes  List chronic conditions. 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 ---No     Guidelines    Guideline Title Affirmed Question Affirmed Notes  Nausea Taking prescription medication that could cause nausea (e.g., narcotics/opiates, antibiotics, OCPs, many others)    Final Disposition User   Call PCP within 24 Hours Zayas, RN, Melissa    Comments  Caller does not need to be seen in 24 hrs due to Zofran rx at pharm and ready for pick up-call to pt and states she just picked up med. To call prn.   Disagree/Comply: Comply

## 2016-10-28 NOTE — Telephone Encounter (Signed)
[facility]  Patient Name: Ashley Moore  DOB: May 06, 1951    Initial Comment Caller states she saw doctor yesterday and she received Tramadol for pain and now she is having nausea.    Nurse Assessment  Nurse: Ardine Bjork, RN, Melissa Date/Time (Eastern Time): 10/28/2016 3:15:40 PM  Confirm and document reason for call. If symptomatic, describe symptoms. ---Caller states she saw doctor yesterday and she received Tramadol for pain and now she is having nausea. States has not started Tramadol yet because she felt so good. 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 No pain since changing to soft diet and has had 6 BMs. Nausea started 10am-constant-denies vomiting.  Does the patient have any new or worsening symptoms? ---Yes  Will a triage be completed? ---Yes  Related visit to physician within the last 2 weeks? ---Yes  Does the PT have any chronic conditions? (i.e. diabetes, asthma, etc.) ---Yes  List chronic conditions. 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 ---No     Guidelines    Guideline Title Affirmed Question Affirmed Notes  Nausea Taking prescription medication that could cause nausea (e.g., narcotics/opiates, antibiotics, OCPs, many others)    Final Disposition User   Call PCP within 24 Hours Zayas, RN, Melissa    Comments  Caller does not need to be seen in 24 hrs due to Zofran rx at pharm and ready for pick up-call to pt and states she just picked up med. To call prn.   Disagree/Comply: Comply

## 2016-10-28 NOTE — Telephone Encounter (Signed)
Noted  

## 2016-10-29 ENCOUNTER — Encounter: Payer: Self-pay | Admitting: Internal Medicine

## 2016-10-30 ENCOUNTER — Encounter: Payer: Self-pay | Admitting: Adult Health

## 2016-11-01 ENCOUNTER — Other Ambulatory Visit: Payer: Self-pay | Admitting: General Surgery

## 2016-11-01 DIAGNOSIS — N899 Noninflammatory disorder of vagina, unspecified: Secondary | ICD-10-CM | POA: Diagnosis not present

## 2016-11-01 DIAGNOSIS — K6289 Other specified diseases of anus and rectum: Secondary | ICD-10-CM | POA: Diagnosis not present

## 2016-11-01 DIAGNOSIS — C44520 Squamous cell carcinoma of anal skin: Secondary | ICD-10-CM | POA: Diagnosis not present

## 2016-11-02 ENCOUNTER — Encounter: Payer: Self-pay | Admitting: Oncology

## 2016-11-02 ENCOUNTER — Telehealth: Payer: Self-pay | Admitting: Nurse Practitioner

## 2016-11-02 NOTE — Telephone Encounter (Signed)
Pt returned call and agreed to appt date and time with Ned Card and Dr. Benay Spice on 2/19 at 145pm. Demographics verified. Letter mailed.

## 2016-11-03 ENCOUNTER — Telehealth: Payer: Self-pay | Admitting: *Deleted

## 2016-11-03 ENCOUNTER — Other Ambulatory Visit: Payer: Self-pay | Admitting: Radiation Oncology

## 2016-11-03 ENCOUNTER — Encounter: Payer: Self-pay | Admitting: Radiation Oncology

## 2016-11-03 ENCOUNTER — Encounter: Payer: Self-pay | Admitting: *Deleted

## 2016-11-03 ENCOUNTER — Other Ambulatory Visit (HOSPITAL_COMMUNITY)
Admission: RE | Admit: 2016-11-03 | Discharge: 2016-11-03 | Disposition: A | Discharge status: Home / Self Care | Payer: Medicare HMO | Source: Ambulatory Visit | Attending: Radiation Oncology | Admitting: Radiation Oncology

## 2016-11-03 ENCOUNTER — Ambulatory Visit
Admission: RE | Admit: 2016-11-03 | Discharge: 2016-11-03 | Disposition: A | Discharge status: Home / Self Care | Payer: Medicare HMO | Source: Ambulatory Visit | Attending: Radiation Oncology | Admitting: Radiation Oncology

## 2016-11-03 VITALS — BP 155/83 | HR 101 | Temp 98.3°F | Resp 20 | Wt 194.8 lb

## 2016-11-03 DIAGNOSIS — C218 Malignant neoplasm of overlapping sites of rectum, anus and anal canal: Secondary | ICD-10-CM | POA: Diagnosis not present

## 2016-11-03 DIAGNOSIS — Z1151 Encounter for screening for human papillomavirus (HPV): Secondary | ICD-10-CM

## 2016-11-03 DIAGNOSIS — Z87891 Personal history of nicotine dependence: Secondary | ICD-10-CM | POA: Diagnosis not present

## 2016-11-03 DIAGNOSIS — Z01411 Encounter for gynecological examination (general) (routine) with abnormal findings: Secondary | ICD-10-CM

## 2016-11-03 DIAGNOSIS — C210 Malignant neoplasm of anus, unspecified: Secondary | ICD-10-CM | POA: Diagnosis not present

## 2016-11-03 DIAGNOSIS — Z01419 Encounter for gynecological examination (general) (routine) without abnormal findings: Secondary | ICD-10-CM | POA: Diagnosis not present

## 2016-11-03 DIAGNOSIS — N9089 Other specified noninflammatory disorders of vulva and perineum: Secondary | ICD-10-CM | POA: Diagnosis not present

## 2016-11-03 NOTE — Progress Notes (Signed)
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 A diagnosis of Anal cancer ([place]) was also pertinent to this visit.   HISTORY OF PRESENT ILLNESS: Ashley Moore is a 66 y.o. female seen at the request of Dr. Leighton Ruff for a new diagnosis of probable anal cancer. The patient was originally seen by Dr. Loletha Carrow of [facility] in June 2017 for anal pain and bleeding that appeared due to a thrombosed external hemorrhoid. The patient used sitz baths and continued to have rectal pain with intermittent bleeding and frequent bowel movements. Due to worsening symptoms, the patient presented to her PCP on 10/20/16 and was tested for guaiac positive stool. The patient had a CT of the abdomen and pelvis on 10/21/16 that showed thickening of the anus extending to the junction between the anus and rectum with a large stool ball in the rectum, mild fat stranding posterior to the rectum, and lymphadenopathy posterior to the rectum and in the inguinal regions.  The patient returned to Dr. Loletha Carrow on 10/24/16 who noted a large, circumferential, firm mass protruding from the anus, the lumen was almost obliterated, and he was unable to perform a digital rectal exam. She met  Dr. Marcello Moores on 11/01/16 who noted a large fungating mass consistent with circumferential anal cancer on exam, palpable lymphadenopathy bilaterally, and a large and  lesion of the left labia. Biopsy was obtained at that time and pathology is pending. She comes today to discuss the role of radiotherapy in the course of her treatment.   Of note, her GYN History is as follows: The patient is a G1P0, and reports her last pap smear was approximately 2 years ago that was normal, and she does  not recall a history of dysplasia of the cervix. 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 Sherrine Maples.    PREVIOUS RADIATION THERAPY: No   PAST MEDICAL HISTORY:  Past Medical History:  Diagnosis Date  . Bowen's disease    excised 1992  . Headache   . Hx of varicella        PAST SURGICAL HISTORY: Past Surgical History:  Procedure Laterality Date  . BUNIONECTOMY Right   . SKIN CANCER EXCISION     bowens disease     FAMILY HISTORY:  Family History  Problem Relation Age of Onset  . Lung cancer Mother     lung  . Pancreatitis Father   . Breast cancer Sister   . Breast cancer Maternal Grandmother     breast   . Breast cancer Maternal Aunt     breast  . Melanoma Sister      SOCIAL HISTORY:  reports that she has quit smoking. She quit smokeless tobacco use about 21 years ago. She reports that she drinks alcohol. She reports that she uses drugs, including Marijuana.  The patient owns and operates a gas station and lives in [place], [place].  ALLERGIES: Benadryl [diphenhydramine]; Melatonin; and Penicillins   MEDICATIONS:  Current Outpatient Prescriptions  Medication Sig Dispense Refill  . APPLE CIDER VINEGAR PO Take 1,000 mg by mouth daily.    Marland Kitchen aspirin 81 MG tablet Take  81 mg by mouth daily.    Marland Kitchen docusate sodium (COLACE) 100 MG capsule Take 100 mg by mouth daily as needed for mild constipation.    Marland Kitchen ibuprofen (ADVIL,MOTRIN) 200 MG tablet Take 600 mg by mouth every 6 (six) hours as needed for moderate pain.    . meloxicam (MOBIC) 15 MG tablet TAKE 1 TABLET EVERY DAY (Patient taking differently: TAKE 1 TABLET PO EVERY DAY AS NEEDED FOR PAIN) 90 tablet 0  . Multiple Vitamin (MULTIVITAMIN) tablet Take 1 tablet by mouth daily.     . ondansetron (ZOFRAN) 4 MG tablet Take 1 tablet (4 mg total) by mouth every 8 (eight) hours as needed for nausea or  vomiting. 30 tablet 0  . oxyCODONE-acetaminophen (PERCOCET/ROXICET) 5-325 MG tablet Take 1 tablet by mouth every 4 (four) hours as needed for severe pain. 15 tablet 0  . simethicone (MYLICON) 094 MG chewable tablet Chew 125 mg by mouth every 6 (six) hours as needed for flatulence.    Marland Kitchen tiZANidine (ZANAFLEX) 4 MG tablet TAKE 1 TABLET EVERY 8 HOURS AS NEEDED FOR MUSCLE SPASM(S) 90 tablet 0  . traMADol (ULTRAM) 50 MG tablet Take 0.5-1 tablets (25-50 mg total) by mouth every 6 (six) hours as needed for moderate pain. 30 tablet 0  . gabapentin (NEURONTIN) 100 MG capsule Take 1 capsule (100 mg total) by mouth 3 (three) times daily. (Patient not taking: Reported on 10/24/2016) 270 capsule 0   No current facility-administered medications for this encounter.      REVIEW OF SYSTEMS: On review of systems, the patient reports that she is doing well overall. She denies any chest pain, shortness of breath, cough, fevers, chills, or night sweats. The patient reports her vulvar lesions have "doubled" in size in the past week and that it took four hours to pass a stool this morning.She has to take stool softeners to keep her stool soft/liquid in order to pass them. She reports when she passes a stool, it is dark in color. Abdominal gas. and ntermittent rectal bleeding is noted. She reports abnormal weight gain of 7 pounds, with subsequent loss in a short period of time. She has abdominal and groin pain and nausea. She reports discomfort sitting at times as a result of her vulvar lesions. She denies any new musculoskeletal or joint aches or pains. A complete review of systems is obtained and is otherwise negative.  PHYSICAL EXAM:  Wt Readings from Last 3 Encounters:  11/03/16 194 lb 12.8 oz (88.4 kg)  10/27/16 202 lb (91.6 kg)  10/24/16 198 lb (89.8 kg)   Temp Readings from Last 3 Encounters:  11/03/16 98.3 F (36.8 C) (Oral)  10/27/16 97.7 F (36.5 C) (Oral)  10/24/16 98.3 F (36.8 C) (Oral)   BP Readings  from Last 3 Encounters:  11/03/16 (!) 155/83  10/27/16 116/72  10/24/16 144/83   Pulse Readings from Last 3 Encounters:  11/03/16 (!) 101  10/24/16 78  10/24/16 100   Pain Assessment Pain Score: 6 /10  In general this is a well appearing Caucasian female who was teary eyed during the encounter. She is alert and oriented x4 and appropriate throughout the examination. HEENT reveals that the patient is normocephalic, atraumatic. EOMs are intact. PERRLA. Skin is intact without any evidence of gross lesions. Cardiovascular exam reveals a regular rate and rhythm, no clicks rubs or murmurs are auscultated. Chest is clear to auscultation bilaterally. Lower extremities are negative for pretibial pitting edema, deep calf tenderness, cyanosis or clubbing. Lymphatic assessment  is performed and does not reveal any adenopathy in the cervical, supraclavicular, or axillary chains. In the left groin, she has 3 palpable matted/fixed adenopathy which is somewhat uncomfortable with palpation. She does not have any lymphedema in the extremities. The right groin has 1-2 nodes with similar characteristics as in the left. Abdomen has active bowel sounds in all quadrants and is intact. The abdomen is soft, non tender, non distended. Pelvic examination reveals normal appearing external female genitalia with a 1.5-2 cm circumscribed erythematous lesion along the labia majora on the left, with a satellite/daughter lesion about 1 cm above the larger lesion. Both have smooth surface without drainage or sloughing of epithelium. No bleeding is noted. No other lesions are seen of the labia, perineal, or perianal tissue. There appears to be a well healed scar along the right labia majora. Inspection of the anus reveals a large, circumferential tumor with multiple fungating areas that appear to be multinodular. No bleeding is noted. The previous biopsy site is healing at 12 o'clock. Speculum exam is performed and the vagina and cervix  are normal in appearance. A pap smear is obtained without difficulty. Bimanual exam is negative for palpable mass of the vagina, cervix, or adnexa. Rectovaginal exam cannot be performed due to obstruction by the tumor of the anal orifice. I am unable to advance a fingertip into the rectum.    ECOG = 2  0 - Asymptomatic (Fully active, able to carry on all predisease activities without restriction)  1 - Symptomatic but completely ambulatory (Restricted in physically strenuous activity but ambulatory and able to carry out work of a light or sedentary nature. For example, light housework, office work)  2 - Symptomatic, <50% in bed during the day (Ambulatory and capable of all self care but unable to carry out any work activities. Up and about more than 50% of waking hours)  3 - Symptomatic, >50% in bed, but not bedbound (Capable of only limited self-care, confined to bed or chair 50% or more of waking hours)  4 - Bedbound (Completely disabled. Cannot carry on any self-care. Totally confined to bed or chair)  5 - Death   Eustace Pen MM, Creech RH, Tormey DC, et al. [phone]). "Toxicity and response criteria of the [facility]". [place] Oncol. 5 (6): 649-55    LABORATORY DATA:  Lab Results  Component Value Date   WBC 9.1 10/24/2016   HGB 11.3 (L) 10/24/2016   HCT 34.7 (L) 10/24/2016   MCV 83.4 10/24/2016   PLT 260 10/24/2016   Lab Results  Component Value Date   NA 140 10/24/2016   K 3.9 10/24/2016   CL 109 10/24/2016   CO2 22 10/24/2016   Lab Results  Component Value Date   ALT 30 10/24/2016   AST 53 (H) 10/24/2016   ALKPHOS 76 10/24/2016   BILITOT 0.2 (L) 10/24/2016      RADIOGRAPHY: Ct Abdomen Pelvis W Contrast  Result Date: 10/21/2016 CLINICAL DATA:  Diarrhea for 1 month with rectal bleeding for 2 weeks. EXAM: CT ABDOMEN AND PELVIS WITH CONTRAST TECHNIQUE: Multidetector CT imaging of the abdomen and pelvis was performed using the standard protocol  following bolus administration of intravenous contrast. CONTRAST:  151m ISOVUE-300 IOPAMIDOL (ISOVUE-300) INJECTION 61% COMPARISON:  May 05, 2015 FINDINGS: Lower chest: No acute abnormality. Hepatobiliary: No focal liver abnormality is seen. No gallstones, gallbladder wall thickening, or biliary dilatation. Pancreas: Unremarkable. No pancreatic ductal dilatation or surrounding inflammatory changes. Spleen: Normal in size without focal  abnormality. Adrenals/Urinary Tract: Adrenal glands are unremarkable. Kidneys are normal, without renal calculi, focal lesion, or hydronephrosis. Bladder is unremarkable. Stomach/Bowel: The stomach and small bowel are within normal limits. There is thickening of the anus, possibly extending into the inferior most aspect of the rectum which was not seen previously. There is mild increased attenuation of fat posterior to the rectum and there is a prominent stool ball in the rectum. Colonic diverticuli are seen with no diverticulitis. No other colonic abnormalities are noted. The appendix is normal. Vascular/Lymphatic: There is an enlarged lymph node in the right inguinal region on series 2, image 68 measuring 1.8 cm in short axis diameter, new in the interval. Enlarged left inguinal nodes are also identified with the largest seen on image 67 measuring 2.3 cm in short axis. A few mildly prominent nodes are seen posterior to the rectum such as on coronal image 68 with a short axis dimension of 7 mm. No other adenopathy is identified in the abdomen or pelvis. Minimal atherosclerosis seen in the non aneurysmal aorta. Reproductive: Uterus and bilateral adnexa are unremarkable. Other: No abdominal wall hernia or abnormality. No abdominopelvic ascites. Musculoskeletal: No acute or significant osseous findings. IMPRESSION: 1. Thickening of the anus, extending to the junction between the anus and rectum with a large stool ball in the rectum and mild fat stranding posterior to the rectum. This  is likely the cause of the patient's symptoms. These findings could be seen with anal carcinoma resulting in partial obstruction causing the stool ball and possible developing stercoral colitis. However, inflammatory causes are not excluded. Adenopathy in the inguinal regions could be metastatic or reactive depending on the cause of anal thickening. Shotty nodes posterior to the rectum are nonspecific. 2. No other acute abnormalities. These results will be called to the ordering clinician or representative by the Radiologist Assistant, and communication documented in the PACS or zVision Dashboard. Electronically Signed   By: Dorise Bullion III M.D   On: 10/21/2016 10:19       IMPRESSION/PLAN: 1. Clinical concerns for anal and vulvar malignancy. Dr. Lisbeth Renshaw met with the patient regarding findings and work-up thus far. We discussed the history of locally advanced anal cancer outlining the general treatment, highlighting the role of radiotherapy in the management. We discussed the available radiation techniques, and focused on the details of logistics and delivery. We reviewed the anticipated acute and late sequelae associated with radiation in this setting. The patient was encouraged to ask questions that I answered to the best of my ability. Treatment should be able to shrink the anal tumor and improve the passing of stool. We will order a PET scan for staging workup. We can schedule the patient for CT simulation prior to the PET scan, but hold off on treatment until we get the results of the PET scan. We recommend 6 weeks of neoadjuvant chemoradiation with possible surgery to remove any residual disease. The patient's case will also be presented to GI conference. I will copy Dr. Denman George as well so that she is aware of the concerns. 2. Social work needs. The patient is currently going through financial difficulty and social issues at this time. We will refer her to social work for assistance in resources.  The  above documentation reflects my direct findings during this shared patient visit. Please see the separate note by Dr. Lisbeth Renshaw on this date for the remainder of the patient's plan of care.    Carola Rhine, PAC  This document serves as  a record of services personally performed by Shona Simpson, PA-C and Kyung Rudd, MD. It was created on their behalf by Darcus Austin, a trained medical scribe. The creation of this record is based on the scribe's personal observations and the providers' statements to them. This document has been checked and approved by the attending provider.   Addendum: I spoke with Dr. 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    Carola Rhine, PAC

## 2016-11-03 NOTE — Progress Notes (Signed)
[facility] Psychosocial Distress Screening Clinical Social Work  Clinical Social Work was referred by distress screening protocol.  The patient scored a 5 on the Psychosocial Distress Thermometer which indicates moderate distress. Clinical Social Worker met with pt after her consult with Dr. Lisbeth Renshaw  to assess for distress and other psychosocial needs. CSW introduced self and explained role of CSW/Support Team. CSW reviewed common emotions and resources for support through Pt and Family Support. Pt reports her depression has been an ongoing concern, but has worsened due to recent cancer diagnosis. Her anxiety is also related to her cancer, but in addition she has many life stressors currently. Pt shared recent grief and loss concerns, financial concerns, family dynamics and issues with neighbors stalking her. Pt has involved local law enforcement, lawyers and has an accountant helping her with many of these issues. CSW validated emotions and encouraged pt to consider support through support group, counseling and other support services. She agreed to consider and will reach out as needed. CSW provided pt with hand outs on all resources as well. Pt appreciated supportive listening and felt encouraged after our discussion.   ONCBCN DISTRESS SCREENING 11/03/2016  Screening Type Initial Screening  Distress experienced in past week (1-10) 5  Emotional problem type Depression;Nervousness/Anxiety  Physical Problem type Constipation/diarrhea;Tingling hands/feet;Sexual problems  Physician notified of physical symptoms Yes  Referral to clinical social work Yes    Clinical Social Worker follow up needed: Yes.    If yes, follow up plan:  See above Loren Racer, LCSW, OSW-C Clinical Social Worker [place]  [facility] Phone: [phone] Fax: [phone]

## 2016-11-03 NOTE — Progress Notes (Signed)
Please see the Nurse Progress Note in the MD Initial Consult Encounter for this patient. 

## 2016-11-03 NOTE — Telephone Encounter (Signed)
Spoke with Social worker  Ander Purpura, she will send herself or Abigale   to see patient  After her consult, thanked Lauren, patient is very emotional  A lot of life dynamics 9:17 AM

## 2016-11-03 NOTE — Progress Notes (Addendum)
GI Location of Tumor / Histology: Anal and vaginal mass  Ashley Moore presented  months ago with symptoms of: bleeding, pain rectal  Biopsies : 10/31/16: Pending   Past/Anticipated interventions by surgeon, if any: Dr. Leighton Ruff, MD  Past/Anticipated interventions by medical oncology, if any:  Weight changes, if any: loss 7-8 lbs  Bowel/Bladder complaints, if any:  constipation , bleeding, took 4 hours this am  To have a bowel movement,   Nausea / Vomiting, if any: none since last week  Pain issues, if any:  rctal  Any blood per rectum:   yes  SAFETY ISSUES: NO  Prior radiation? NO  Pacemaker/ICD? NO  Possible current pregnancy? No  Is the patient on methotrexate? NO  Current Complaints/Details:Widowed, menses age 66, G48P1, age 33st birth age 71,  Has a vaginal mass also, former cigarette smoker 1ppd x 25 years, 1995, social alcohol, smokes marijuana daily Sister died  Metastatic breast tobone cancer age 50 , Mother Breast cancer, mastectomy,  maternal grandmother Maternal Aunt both breast cancer BP (!) 155/83 (BP Location: Left Arm, Patient Position: Sitting, Cuff Size: Normal)   Pulse (!) 101   Temp 98.3 F (36.8 C) (Oral)   Resp 20   Wt 194 lb 12.8 oz (88.4 kg)   BMI 35.06 kg/m   Wt Readings from Last 3 Encounters:  11/03/16 194 lb 12.8 oz (88.4 kg)  10/27/16 202 lb (91.6 kg)  10/24/16 198 lb (89.8 kg)

## 2016-11-04 ENCOUNTER — Telehealth: Payer: Self-pay | Admitting: *Deleted

## 2016-11-04 NOTE — Addendum Note (Signed)
Encounter addended by: Kyung Rudd, MD on: 11/04/2016  2:18 PM<BR>    Actions taken: Problem List modified

## 2016-11-04 NOTE — Telephone Encounter (Signed)
Called patient to inform of Pet Scan on 11-11-16 - arrival time [facility] in [place], lvm for a return call, pt. To be NPO - 6 hrs. Prior to test, lvm for a return call

## 2016-11-07 ENCOUNTER — Ambulatory Visit: Payer: Medicare HMO | Admitting: Radiation Oncology

## 2016-11-07 ENCOUNTER — Telehealth: Payer: Self-pay | Admitting: Nurse Practitioner

## 2016-11-07 ENCOUNTER — Ambulatory Visit (HOSPITAL_BASED_OUTPATIENT_CLINIC_OR_DEPARTMENT_OTHER): Payer: Medicare HMO | Admitting: Nurse Practitioner

## 2016-11-07 VITALS — BP 135/81 | HR 91 | Temp 98.0°F | Resp 18 | Ht 62.5 in | Wt 192.3 lb

## 2016-11-07 DIAGNOSIS — C210 Malignant neoplasm of anus, unspecified: Secondary | ICD-10-CM

## 2016-11-07 DIAGNOSIS — L988 Other specified disorders of the skin and subcutaneous tissue: Secondary | ICD-10-CM

## 2016-11-07 LAB — CYTOLOGY - PAP
Diagnosis: NEGATIVE
HPV: DETECTED — AB

## 2016-11-07 MED ORDER — TRAMADOL HCL 50 MG PO TABS: 25.0000 mg | ORAL_TABLET | Freq: Four times a day (QID) | ORAL | 0 refills | Status: DC | PRN

## 2016-11-07 MED ORDER — PROCHLORPERAZINE MALEATE 10 MG PO TABS: 10.0000 mg | ORAL_TABLET | Freq: Four times a day (QID) | ORAL | 0 refills | Status: DC | PRN

## 2016-11-07 NOTE — Telephone Encounter (Signed)
Appointments scheduled per 2/19 LOS. Patient given AVS report and calendars with future scheduled appointments. °

## 2016-11-07 NOTE — Progress Notes (Signed)
START ON PATHWAY REGIMEN - Anal Carcinoma  ANLOS01: 5-Fluorouracil 1,000 mg/m2/day CIV D1-4, 29-32 + Mitomycin 10 mg/m2 D1, 29 + Concurrent Radiation Therapy    Chemotherapy concurrent with RT:     Mitomycin (Mutamycin(R)) 10 mg/m2 in 50 mL NS IV over 15 minutes on days 1 and 29 (maximum dose = 20 mg per course; may need to adjust D29 dose based on tolerance)       Dose Mod: None     5-Fluorouracil 1,000 mg/m2/day in _____mL NS by continuous IV infusion over 24 hours on days 1, 2, 3, 4 and 29, 30, 31, 32 (Note: total dose per course = 4,000 mg/m2 over 96 hours)       Dose Mod: None         Additional Orders: Chemotherapy is given concurrently with radiation therapy.   [place]; [id number]. Duayne Cal, et al. JAMA 2008; 299(16):1914-21.  **Always confirm dose/schedule in your pharmacy ordering system**    Patient Characteristics: Anal and Anal Margin Tumors, Newly Diagnosed - Locoregional Disease Not Amenable to Local Excision AJCC T Category: T3 AJCC N Category: N1a AJCC M Category: M0 AJCC 8 Stage Grouping: IIIC Current Disease Status: Newly Diagnosed - Locoregional Disease Not Amendable to Local Excision  Intent of Therapy: Curative Intent, Discussed with Patient

## 2016-11-07 NOTE — Telephone Encounter (Signed)
Called patient to inform her of infusion appointment scheduled after her visit to scheduling.

## 2016-11-07 NOTE — Progress Notes (Addendum)
[facility] New Patient Consult   Referring MD: [name], [address] [place], [place] [address]   Ashley Moore 66 y.o.  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 She reports developing an "external hemorrhoid" in June 2017. This was treated with sitz baths. By August 2017 the hemorrhoid had tripled in size. On 09/20/2016 she developed rectal pain and bleeding. She was seen by her PCP 10/20/2016 and referred to gastroenterology and for CT scans. CT scan of the abdomen/pelvis 10/21/2016 showed thickening of the anus extending to the junction between the anus and rectum with a large stool ball in the rectum and mild fat stranding posterior to the rectum. Enlarged lymph nodes were noted in the right and left inguinal regions. A few mildly prominent nodes were seen posterior to the rectum. She saw Dr. Loletha Carrow on 10/24/2016 and was noted to have a large circumferential firm mass protruding from the anus. Lumen was almost obliterated. Digital rectal exam could not be performed. She saw Dr. Leighton Ruff on [id number]. Anorectal exam showed a large fungating mass consistent with circumferential anal squamous cell carcinoma. She was noted to have palpable bilateral inguinal nodes. 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 She has seen Dr. Lisbeth Renshaw with initial recommendation for radiation and chemotherapy. She has been referred for a PET scan and also to see Dr. Denman George.   Past Medical History:  Diagnosis Date  . Bowen's disease (involving vagina, treated with surgery and topical agent)    excised 1992  . Headache   . Hx of varicella   History of cervical spine fracture  Past Surgical History:  Procedure Laterality Date  . BUNIONECTOMY Right   . SKIN CANCER EXCISION     bowens disease    Medications: Reviewed   Allergies    Allergen Reactions  . Benadryl [Diphenhydramine] Other (See Comments)    Tingle all over   . Melatonin     Tingle all over   . Penicillins Rash    Family history: Father deceased with pancreatitis. Mother deceased with sepsis, history of breast cancer, "other" cancers as well. Sister deceased with breast cancer. Maternal grandmother and maternal aunt with history of breast cancer.  Social History: Ashley Moore lives in [place]. She is a widow. She owns a gas station. No children. She quit smoking in 1995 at 1 pack per day for 25 years. She reports occasional alcohol intake. She smokes marijuana. She has never had a blood transfusion.   ROS: She reports noticing an external hemorrhoid in June 2017. This tripled in size by August 2017. In January 2018 she developed rectal pain and bleeding. She has significant pain with bowel movements. She takes tramadol as needed with good relief. She tried a single dose of oxycodone but had severe nausea/vomiting. She has small frequent bowel movements. She reports a good appetite. She has lost about 10 pounds over the past few weeks. No fever. At times during the day she notes chills and sweats. She sleeps "well". No shortness of breath or cough. No dysphagia. No hematuria or dysuria. No vaginal discharge. She noted "bumps" on the labia in December 2017. The bumps have markedly increased in size over the past 2 weeks.   Physical Exam:  Blood pressure 135/81, pulse 91, temperature 98 F (36.7 C), temperature source Oral, resp. rate 18, height 5'  2.5" (1.588 m), weight 192 lb 4.8 oz (87.2 kg), SpO2 100 %.  HEENT: PERRLA. Sclerae anicteric. No thrush or ulcers. Lungs: Lungs clear bilaterally. Cardiac: Regular rate and rhythm. Abdomen: Abdomen soft and nontender. No hepatomegaly. GU: Left labia majora with an approximate 3 cm lesion; similar 1.5 cm lesion located superiorly. There is skin thickening/induration extending inferiorly. Vascular: No leg  edema. Lymph nodes: No palpable cervical, supraclavicular or axillary lymph nodes. 2 cm right inguinal lymph node, 3 cm left inguinal lymph node. Neurologic: Alert and oriented. Anorectal: Large protruding fungating mass.   LAB:  CBC  Lab Results  Component Value Date   WBC 9.1 10/24/2016   HGB 11.3 (L) 10/24/2016   HCT 34.7 (L) 10/24/2016   MCV 83.4 10/24/2016   PLT 260 10/24/2016   NEUTROABS 6.3 10/24/2016     CMP      Component Value Date/Time   NA 140 10/24/2016 1740   K 3.9 10/24/2016 1740   CL 109 10/24/2016 1740   CO2 22 10/24/2016 1740   GLUCOSE 92 10/24/2016 1740   BUN 19 10/24/2016 1740   CREATININE 0.71 10/24/2016 1740   CREATININE 0.64 10/20/2016 1718   CALCIUM 9.1 10/24/2016 1740   PROT 7.6 10/24/2016 1740   ALBUMIN 4.1 10/24/2016 1740   AST 53 (H) 10/24/2016 1740   ALT 30 10/24/2016 1740   ALKPHOS 76 10/24/2016 1740   BILITOT 0.2 (L) 10/24/2016 1740   GFRNONAA >60 10/24/2016 1740   GFRAA >60 10/24/2016 1740    No results found for: CEA  Imaging:  No results found.    Assessment/Plan:   1. Anal cancer  CT abdomen/pelvis 10/21/2016-thickening of the anus extending to the junction between the anus and rectum with a large stool ball in the rectum and mild fat stranding posterior to the rectum. Enlarged lymph nodes in the right and left inguinal regions. A few mildly prominent node seen posterior to the rectum.  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 2. Left labial lesions. Question direct extension from the anal cancer versus metastatic disease from anal cancer versus a separate malignant process. 3. Pain and bleeding secondary to #1. 4. 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 5. Multiple family members with breast cancer.   Disposition: Ashley Moore has been diagnosed with anal cancer. She appears to have involvement of bilateral inguinal lymph nodes. The lesions at the left  labia may be direct extension from the anal cancer versus metastatic disease from anal cancer versus a separate malignant process. She has been referred for a staging PET scan. She is scheduled for radiation simulation on 11/08/2016. Dr. Benay Spice recommends chemotherapy with 5-FU/mitomycin-C on a day 1, day 29 schedule.  We reviewed potential toxicities associated with the chemotherapy including myelosuppression, hair loss/thinning, mouth sores, diarrhea, nausea. We reviewed potential toxicities associated with 5-fluorouracil including hand-foot syndrome, increased sensitivity to sun, skin hyperpigmentation, mouth sores, diarrhea. We discussed the hemolytic uremic syndrome associated with mitomycin-C. She will attend a chemotherapy education class. She understands a PICC line will be necessary for delivery of the chemotherapy.  We anticipate she will begin radiation on 11/14/2016. We will refer her for PICC line placement and cycle 1 5-FU/mitomycin-C 11/14/2016. A prescription was sent to her pharmacy for Compazine 10 mg every 6 hours as needed. She was also provided with a prescription for tramadol 25-50 mg every 6 hours as needed for pain. She understands she should not be driving while taking pain medication.  She will return for a follow-up visit and labs on 11/24/2016. She will contact the office in the interim with any problems.  Patient seen with Dr. Benay Spice. 45 minutes were spent face-to-face at today's visit with the majority of that time involved in counseling/coordination of care.     Ned Card, ANP/GNP-BC 11/07/2016, 4:01 PM This was a shared visit with Ned Card. Ashley Moore was interviewed and examined. She has been diagnosed with locally advanced anal cancer. She either has a metastasis to the left labia or a separate primary tumor. Her case will be presented at the GI tumor conference this week. The initial plan is to proceed with concurrent chemotherapy and radiation.  We  reviewed the potential toxicities associated with the 5 fluorouracil and mitomycin C regimen. We also discussed the likelihood of developing skin breakdown with radiation.  I will coordinate the initial course of treatment with Dr. Lisbeth Renshaw.  A chemotherapy plan was entered today.  Julieanne Manson, M.D.

## 2016-11-08 ENCOUNTER — Telehealth: Payer: Self-pay | Admitting: *Deleted

## 2016-11-08 ENCOUNTER — Ambulatory Visit
Admission: RE | Admit: 2016-11-08 | Discharge: 2016-11-08 | Disposition: A | Discharge status: Home / Self Care | Payer: Medicare HMO | Source: Ambulatory Visit | Attending: Radiation Oncology | Admitting: Radiation Oncology

## 2016-11-08 ENCOUNTER — Ambulatory Visit: Payer: Self-pay

## 2016-11-08 ENCOUNTER — Other Ambulatory Visit: Payer: Self-pay

## 2016-11-08 ENCOUNTER — Encounter: Payer: Self-pay | Admitting: *Deleted

## 2016-11-08 ENCOUNTER — Other Ambulatory Visit: Payer: Medicare HMO

## 2016-11-08 DIAGNOSIS — C210 Malignant neoplasm of anus, unspecified: Secondary | ICD-10-CM

## 2016-11-08 DIAGNOSIS — Z87891 Personal history of nicotine dependence: Secondary | ICD-10-CM | POA: Diagnosis not present

## 2016-11-08 NOTE — Telephone Encounter (Signed)
Called patient to inform that Pet Scan has been moved to 11-09-16- arrival time - 8 am @ [facility], pt. To be NPO after midnight today, lvm for a return call

## 2016-11-08 NOTE — Telephone Encounter (Signed)
CALLED PATIENT TO INFORM THAT PET HAS BEEN RESCHEDULED FOR 11-10-16 @ [facility], ARRIVAL TIME - 11 AM , PT. TO BE NPO - 6 HRS. PRIOR TO TEST, SPOKE WITH PATIENT AND SHE AGREED TO THIS TEST

## 2016-11-09 ENCOUNTER — Ambulatory Visit: Payer: Medicare HMO

## 2016-11-10 ENCOUNTER — Ambulatory Visit
Admission: RE | Admit: 2016-11-10 | Discharge: 2016-11-10 | Disposition: A | Discharge status: Home / Self Care | Payer: Medicare HMO | Source: Ambulatory Visit | Attending: Radiation Oncology | Admitting: Radiation Oncology

## 2016-11-10 DIAGNOSIS — C210 Malignant neoplasm of anus, unspecified: Secondary | ICD-10-CM | POA: Insufficient documentation

## 2016-11-10 DIAGNOSIS — C772 Secondary and unspecified malignant neoplasm of intra-abdominal lymph nodes: Secondary | ICD-10-CM | POA: Diagnosis not present

## 2016-11-10 DIAGNOSIS — C20 Malignant neoplasm of rectum: Secondary | ICD-10-CM | POA: Diagnosis not present

## 2016-11-10 DIAGNOSIS — R591 Generalized enlarged lymph nodes: Secondary | ICD-10-CM | POA: Insufficient documentation

## 2016-11-10 DIAGNOSIS — I700 Atherosclerosis of aorta: Secondary | ICD-10-CM | POA: Insufficient documentation

## 2016-11-10 LAB — GLUCOSE, CAPILLARY: Glucose-Capillary: 73 mg/dL (ref 65–99)

## 2016-11-10 MED ORDER — FLUDEOXYGLUCOSE F - 18 (FDG) INJECTION
12.8300 | Freq: Once | INTRAVENOUS | Status: AC | PRN
  Administered 2016-11-10: 12.83 via INTRAVENOUS

## 2016-11-11 DIAGNOSIS — Z87891 Personal history of nicotine dependence: Secondary | ICD-10-CM | POA: Diagnosis not present

## 2016-11-11 DIAGNOSIS — C210 Malignant neoplasm of anus, unspecified: Secondary | ICD-10-CM | POA: Diagnosis not present

## 2016-11-13 ENCOUNTER — Other Ambulatory Visit: Payer: Self-pay | Admitting: Oncology

## 2016-11-14 ENCOUNTER — Encounter: Payer: Self-pay | Admitting: Radiation Oncology

## 2016-11-14 ENCOUNTER — Other Ambulatory Visit: Payer: Self-pay | Admitting: Nurse Practitioner

## 2016-11-14 ENCOUNTER — Ambulatory Visit (HOSPITAL_COMMUNITY)
Admission: RE | Admit: 2016-11-14 | Discharge: 2016-11-14 | Disposition: A | Discharge status: Home / Self Care | Payer: Medicare HMO | Source: Ambulatory Visit | Attending: Nurse Practitioner | Admitting: Nurse Practitioner

## 2016-11-14 ENCOUNTER — Encounter: Payer: Self-pay | Admitting: General Practice

## 2016-11-14 ENCOUNTER — Ambulatory Visit (HOSPITAL_BASED_OUTPATIENT_CLINIC_OR_DEPARTMENT_OTHER): Payer: Medicare HMO

## 2016-11-14 ENCOUNTER — Encounter: Payer: Self-pay | Admitting: Gynecologic Oncology

## 2016-11-14 ENCOUNTER — Other Ambulatory Visit: Payer: Self-pay | Admitting: *Deleted

## 2016-11-14 ENCOUNTER — Ambulatory Visit
Admission: RE | Admit: 2016-11-14 | Discharge: 2016-11-14 | Disposition: A | Discharge status: Home / Self Care | Payer: Medicare HMO | Source: Ambulatory Visit | Attending: Radiation Oncology | Admitting: Radiation Oncology

## 2016-11-14 ENCOUNTER — Ambulatory Visit: Payer: Medicare HMO | Attending: Gynecologic Oncology | Admitting: Gynecologic Oncology

## 2016-11-14 VITALS — BP 149/85 | HR 82 | Temp 97.8°F | Resp 18 | Ht 62.5 in | Wt 192.0 lb

## 2016-11-14 VITALS — BP 120/83 | HR 74 | Temp 97.6°F | Resp 20

## 2016-11-14 DIAGNOSIS — C210 Malignant neoplasm of anus, unspecified: Secondary | ICD-10-CM | POA: Diagnosis not present

## 2016-11-14 DIAGNOSIS — N909 Noninflammatory disorder of vulva and perineum, unspecified: Secondary | ICD-10-CM | POA: Insufficient documentation

## 2016-11-14 DIAGNOSIS — Z5111 Encounter for antineoplastic chemotherapy: Secondary | ICD-10-CM | POA: Diagnosis not present

## 2016-11-14 DIAGNOSIS — Z87891 Personal history of nicotine dependence: Secondary | ICD-10-CM | POA: Insufficient documentation

## 2016-11-14 DIAGNOSIS — K5900 Constipation, unspecified: Secondary | ICD-10-CM | POA: Diagnosis not present

## 2016-11-14 DIAGNOSIS — Z85828 Personal history of other malignant neoplasm of skin: Secondary | ICD-10-CM | POA: Diagnosis not present

## 2016-11-14 DIAGNOSIS — C7982 Secondary malignant neoplasm of genital organs: Secondary | ICD-10-CM | POA: Diagnosis not present

## 2016-11-14 DIAGNOSIS — Z452 Encounter for adjustment and management of vascular access device: Secondary | ICD-10-CM | POA: Diagnosis not present

## 2016-11-14 HISTORY — PX: IR GENERIC HISTORICAL: IMG1180011

## 2016-11-14 LAB — CBC WITH DIFFERENTIAL/PLATELET
BASO%: 0.8 % (ref 0.0–2.0)
Basophils Absolute: 0 10*3/uL (ref 0.0–0.1)
EOS%: 2.2 % (ref 0.0–7.0)
Eosinophils Absolute: 0.1 10*3/uL (ref 0.0–0.5)
HCT: 35.5 % (ref 34.8–46.6)
HGB: 11.7 g/dL (ref 11.6–15.9)
LYMPH%: 32.5 % (ref 14.0–49.7)
MCH: 27.1 pg (ref 25.1–34.0)
MCHC: 33.1 g/dL (ref 31.5–36.0)
MCV: 81.9 fL (ref 79.5–101.0)
MONO#: 0.5 10*3/uL (ref 0.1–0.9)
MONO%: 7.8 % (ref 0.0–14.0)
NEUT#: 3.6 10*3/uL (ref 1.5–6.5)
NEUT%: 56.7 % (ref 38.4–76.8)
Platelets: 250 10*3/uL (ref 145–400)
RBC: 4.33 10*6/uL (ref 3.70–5.45)
RDW: 12.9 % (ref 11.2–14.5)
WBC: 6.4 10*3/uL (ref 3.9–10.3)
lymph#: 2.1 10*3/uL (ref 0.9–3.3)

## 2016-11-14 LAB — CEA (IN HOUSE-CHCC): CEA (CHCC-IN HOUSE): 1.08 ng/mL (ref 0.00–5.00)

## 2016-11-14 LAB — COMPREHENSIVE METABOLIC PANEL
ALT: 16 U/L (ref 0–55)
ANION GAP: 8 meq/L (ref 3–11)
AST: 34 U/L (ref 5–34)
Albumin: 3.7 g/dL (ref 3.5–5.0)
Alkaline Phosphatase: 89 U/L (ref 40–150)
BUN: 12.7 mg/dL (ref 7.0–26.0)
CHLORIDE: 102 meq/L (ref 98–109)
CO2: 24 meq/L (ref 22–29)
CREATININE: 0.7 mg/dL (ref 0.6–1.1)
Calcium: 9.5 mg/dL (ref 8.4–10.4)
EGFR: 85 mL/min/{1.73_m2} — ABNORMAL LOW (ref >90)
Glucose: 89 mg/dl (ref 70–140)
Potassium: 3.4 mEq/L — ABNORMAL LOW (ref 3.5–5.1)
Sodium: 135 mEq/L — ABNORMAL LOW (ref 136–145)
Total Bilirubin: 0.33 mg/dL (ref 0.20–1.20)
Total Protein: 7.7 g/dL (ref 6.4–8.3)

## 2016-11-14 MED ORDER — SODIUM CHLORIDE 0.9 % IV SOLN
1020.0000 mg/m2/d | INTRAVENOUS | Status: DC
  Administered 2016-11-14: 8000 mg via INTRAVENOUS
  Filled 2016-11-14: qty 160

## 2016-11-14 MED ORDER — LIDOCAINE HCL 1 % IJ SOLN
INTRAMUSCULAR | Status: AC | PRN
  Administered 2016-11-14: 10 mL

## 2016-11-14 MED ORDER — PROCHLORPERAZINE MALEATE 10 MG PO TABS
10.0000 mg | ORAL_TABLET | Freq: Once | ORAL | Status: AC
  Administered 2016-11-14: 10 mg via ORAL

## 2016-11-14 MED ORDER — MITOMYCIN CHEMO IV INJECTION 20 MG
8.1000 mg/m2 | Freq: Once | INTRAVENOUS | Status: AC
  Administered 2016-11-14: 16 mg via INTRAVENOUS
  Filled 2016-11-14: qty 32

## 2016-11-14 MED ORDER — SODIUM CHLORIDE 0.9 % IV SOLN
Freq: Once | INTRAVENOUS | Status: AC
  Administered 2016-11-14: 16:00:00 via INTRAVENOUS

## 2016-11-14 MED ORDER — LIDOCAINE HCL 1 % IJ SOLN
INTRAMUSCULAR | Status: AC
  Filled 2016-11-14: qty 20

## 2016-11-14 MED ORDER — PROCHLORPERAZINE MALEATE 10 MG PO TABS
ORAL_TABLET | ORAL | Status: AC
  Filled 2016-11-14: qty 1

## 2016-11-14 NOTE — Patient Instructions (Signed)
[facility] Discharge Instructions for Patients Receiving Chemotherapy  Today you received the following chemotherapy agents Mitomycin and Adrucil  To help prevent nausea and vomiting after your treatment, we encourage you to take your nausea medication as directed.    If you develop nausea and vomiting that is not controlled by your nausea medication, call the clinic.   BELOW ARE SYMPTOMS THAT SHOULD BE REPORTED IMMEDIATELY:  *FEVER GREATER THAN 100.5 F  *CHILLS WITH OR WITHOUT FEVER  NAUSEA AND VOMITING THAT IS NOT CONTROLLED WITH YOUR NAUSEA MEDICATION  *UNUSUAL SHORTNESS OF BREATH  *UNUSUAL BRUISING OR BLEEDING  TENDERNESS IN MOUTH AND THROAT WITH OR WITHOUT PRESENCE OF ULCERS  *URINARY PROBLEMS  *BOWEL PROBLEMS  UNUSUAL RASH Items with * indicate a potential emergency and should be followed up as soon as possible.  Feel free to call the clinic you have any questions or concerns. The clinic phone number is [phone].  Please show the [facility] at check-in to the Emergency Department and triage nurse.  Mitomycin injection What is this medicine? MITOMYCIN (mye toe MYE sin) is a chemotherapy drug. This medicine is used to treat cancer of the stomach and pancreas. This medicine may be used for other purposes; ask your health care provider or pharmacist if you have questions. COMMON BRAND NAME(S): Mutamycin What should I tell my health care provider before I take this medicine? They need to know if you have any of these conditions: -anemia -bleeding disorder -infection (especially a virus infection such as chickenpox, cold sores, or herpes) -kidney disease -low blood counts like low platelets, red blood cells, white blood cells -recent radiation therapy -an unusual or allergic reaction to mitomycin, other chemotherapy agents, other medicines, foods, dyes, or preservatives -pregnant or trying to get pregnant -breast-feeding How should I use  this medicine? This drug is given as an injection or infusion into a vein. It is administered in a hospital or clinic by a specially trained health care professional. Talk to your pediatrician regarding the use of this medicine in children. Special care may be needed. Overdosage: If you think you have taken too much of this medicine contact a poison control center or emergency room at once. NOTE: This medicine is only for you. Do not share this medicine with others. What if I miss a dose? It is important not to miss your dose. Call your doctor or health care professional if you are unable to keep an appointment. What may interact with this medicine? -medicines to increase blood counts like filgrastim, pegfilgrastim, sargramostim -vaccines This list may not describe all possible interactions. Give your health care provider a list of all the medicines, herbs, non-prescription drugs, or dietary supplements you use. Also tell them if you smoke, drink alcohol, or use illegal drugs. Some items may interact with your medicine. What should I watch for while using this medicine? Your condition will be monitored carefully while you are receiving this medicine. You will need important blood work done while you are taking this medicine. This drug may make you feel generally unwell. This is not uncommon, as chemotherapy can affect healthy cells as well as cancer cells. Report any side effects. Continue your course of treatment even though you feel ill unless your doctor tells you to stop. Call your doctor or health care professional for advice if you get a fever, chills or sore throat, or other symptoms of a cold or flu. Do not treat yourself. This drug decreases your body's ability  to fight infections. Try to avoid being around people who are sick. This medicine may increase your risk to bruise or bleed. Call your doctor or health care professional if you notice any unusual bleeding. Be careful brushing and  flossing your teeth or using a toothpick because you may get an infection or bleed more easily. If you have any dental work done, tell your dentist you are receiving this medicine. Avoid taking products that contain aspirin, acetaminophen, ibuprofen, naproxen, or ketoprofen unless instructed by your doctor. These medicines may hide a fever. Do not become pregnant while taking this medicine. Women should inform their doctor if they wish to become pregnant or think they might be pregnant. There is a potential for serious side effects to an unborn child. Talk to your health care professional or pharmacist for more information. Do not breast-feed an infant while taking this medicine. What side effects may I notice from receiving this medicine? Side effects that you should report to your doctor or health care professional as soon as possible: -allergic reactions like skin rash, itching or hives, swelling of the face, lips, or tongue -low blood counts - this medicine may decrease the number of white blood cells, red blood cells and platelets. You may be at increased risk for infections and bleeding. -signs of infection - fever or chills, cough, sore throat, pain or difficulty passing urine -signs of decreased platelets or bleeding - bruising, pinpoint red spots on the skin, black, tarry stools, blood in the urine -signs of decreased red blood cells - unusually weak or tired, fainting spells, lightheadedness -breathing problems -changes in vision -chest pain -confusion -dry cough -high blood pressure -mouth sores -pain, swelling, redness at site where injected -pain, tingling, numbness in the hands or feet -seizures -swelling of the ankles, feet, hands -trouble passing urine or change in the amount of urine Side effects that usually do not require medical attention (report to your doctor or health care professional if they continue or are bothersome): -diarrhea -green to blue color of urine -hair  loss -loss of appetite -nausea, vomiting This list may not describe all possible side effects. Call your doctor for medical advice about side effects. You may report side effects to FDA at 1-800-FDA-1088. Where should I keep my medicine? This drug is given in a hospital or clinic and will not be stored at home. NOTE: This sheet is a summary. It may not cover all possible information. If you have questions about this medicine, talk to your doctor, pharmacist, or health care provider.  2017 Elsevier/Gold Standard (2008-03-13 11:16:23)  Fluorouracil, 5-FU injection What is this medicine? FLUOROURACIL, 5-FU (flure oh YOOR a sil) is a chemotherapy drug. It slows the growth of cancer cells. This medicine is used to treat many types of cancer like breast cancer, colon or rectal cancer, pancreatic cancer, and stomach cancer. This medicine may be used for other purposes; ask your health care provider or pharmacist if you have questions. COMMON BRAND NAME(S): Adrucil What should I tell my health care provider before I take this medicine? They need to know if you have any of these conditions: -blood disorders -dihydropyrimidine dehydrogenase (DPD) deficiency -infection (especially a virus infection such as chickenpox, cold sores, or herpes) -kidney disease -liver disease -malnourished, poor nutrition -recent or ongoing radiation therapy -an unusual or allergic reaction to fluorouracil, other chemotherapy, other medicines, foods, dyes, or preservatives -pregnant or trying to get pregnant -breast-feeding How should I use this medicine? This drug is given as an  infusion or injection into a vein. It is administered in a hospital or clinic by a specially trained health care professional. Talk to your pediatrician regarding the use of this medicine in children. Special care may be needed. Overdosage: If you think you have taken too much of this medicine contact a poison control center or emergency room  at once. NOTE: This medicine is only for you. Do not share this medicine with others. What if I miss a dose? It is important not to miss your dose. Call your doctor or health care professional if you are unable to keep an appointment. What may interact with this medicine? -allopurinol -cimetidine -dapsone -digoxin -hydroxyurea -leucovorin -levamisole -medicines for seizures like ethotoin, fosphenytoin, phenytoin -medicines to increase blood counts like filgrastim, pegfilgrastim, sargramostim -medicines that treat or prevent blood clots like warfarin, enoxaparin, and dalteparin -methotrexate -metronidazole -pyrimethamine -some other chemotherapy drugs like busulfan, cisplatin, estramustine, vinblastine -trimethoprim -trimetrexate -vaccines Talk to your doctor or health care professional before taking any of these medicines: -acetaminophen -aspirin -ibuprofen -ketoprofen -naproxen This list may not describe all possible interactions. Give your health care provider a list of all the medicines, herbs, non-prescription drugs, or dietary supplements you use. Also tell them if you smoke, drink alcohol, or use illegal drugs. Some items may interact with your medicine. What should I watch for while using this medicine? Visit your doctor for checks on your progress. This drug may make you feel generally unwell. This is not uncommon, as chemotherapy can affect healthy cells as well as cancer cells. Report any side effects. Continue your course of treatment even though you feel ill unless your doctor tells you to stop. In some cases, you may be given additional medicines to help with side effects. Follow all directions for their use. Call your doctor or health care professional for advice if you get a fever, chills or sore throat, or other symptoms of a cold or flu. Do not treat yourself. This drug decreases your body's ability to fight infections. Try to avoid being around people who are  sick. This medicine may increase your risk to bruise or bleed. Call your doctor or health care professional if you notice any unusual bleeding. Be careful brushing and flossing your teeth or using a toothpick because you may get an infection or bleed more easily. If you have any dental work done, tell your dentist you are receiving this medicine. Avoid taking products that contain aspirin, acetaminophen, ibuprofen, naproxen, or ketoprofen unless instructed by your doctor. These medicines may hide a fever. Do not become pregnant while taking this medicine. Women should inform their doctor if they wish to become pregnant or think they might be pregnant. There is a potential for serious side effects to an unborn child. Talk to your health care professional or pharmacist for more information. Do not breast-feed an infant while taking this medicine. Men should inform their doctor if they wish to father a child. This medicine may lower sperm counts. Do not treat diarrhea with over the counter products. Contact your doctor if you have diarrhea that lasts more than 2 days or if it is severe and watery. This medicine can make you more sensitive to the sun. Keep out of the sun. If you cannot avoid being in the sun, wear protective clothing and use sunscreen. Do not use sun lamps or tanning beds/booths. What side effects may I notice from receiving this medicine? Side effects that you should report to your doctor or health care professional  as soon as possible: -allergic reactions like skin rash, itching or hives, swelling of the face, lips, or tongue -low blood counts - this medicine may decrease the number of white blood cells, red blood cells and platelets. You may be at increased risk for infections and bleeding. -signs of infection - fever or chills, cough, sore throat, pain or difficulty passing urine -signs of decreased platelets or bleeding - bruising, pinpoint red spots on the skin, black, tarry stools,  blood in the urine -signs of decreased red blood cells - unusually weak or tired, fainting spells, lightheadedness -breathing problems -changes in vision -chest pain -mouth sores -nausea and vomiting -pain, swelling, redness at site where injected -pain, tingling, numbness in the hands or feet -redness, swelling, or sores on hands or feet -stomach pain -unusual bleeding Side effects that usually do not require medical attention (report to your doctor or health care professional if they continue or are bothersome): -changes in finger or toe nails -diarrhea -dry or itchy skin -hair loss -headache -loss of appetite -sensitivity of eyes to the light -stomach upset -unusually teary eyes This list may not describe all possible side effects. Call your doctor for medical advice about side effects. You may report side effects to FDA at 1-800-FDA-1088. Where should I keep my medicine? This drug is given in a hospital or clinic and will not be stored at home. NOTE: This sheet is a summary. It may not cover all possible information. If you have questions about this medicine, talk to your doctor, pharmacist, or health care provider.  2017 Elsevier/Gold Standard (2008-01-09 13:53:16)

## 2016-11-14 NOTE — Patient Instructions (Signed)
Add miralax daily to bowel regimen.

## 2016-11-14 NOTE — Procedures (Signed)
Right arm basilic vein PICC.  Tip at SVC/RA junction.  Length = 40 cm.  Minimal blood loss and no immediate complication.

## 2016-11-14 NOTE — Progress Notes (Signed)
GI Location of Tumor / Histology: Anal and vaginal mass  Ashley Moore presented  months ago with symptoms of: bleeding, pain rectal  Biopsies : 10/31/16: Pending   Past/Anticipated interventions by surgeon, if any: Dr. Leighton Ruff, MD  Past/Anticipated interventions by medical oncology, if any:  Weight changes, if any: loss 7-8 lbs  Bowel/Bladder complaints, if any:  constipation , bleeding, took 4 hours this am  To have a bowel movement,   Nausea / Vomiting, if any: none since last week  Pain issues, if any:  rectal  Any blood per rectum:   yes  SAFETY ISSUES: NO  Prior radiation? NO  Pacemaker/ICD? NO  Possible current pregnancy? No  Is the patient on methotrexate? NO  Current Complaints/Details:Widowed, menses age 3, G55P1, age 65st birth age 73,  Has a vaginal mass also, former cigarette smoker 1ppd x 25 years, 1995, social alcohol, smokes marijuana daily Sister died  Metastatic breast tobone cancer age 24 , Mother Breast cancer, mastectomy,  maternal grandmother Maternal Aunt both breast cancer BP (!) 165/83 (BP Location: Right Arm, Patient Position: Sitting, Cuff Size: Normal)   Pulse 68   Temp 98.4 F (36.9 C) (Oral)   Resp 20   Wt 133 lb 6.4 oz (60.5 kg)   BMI 24.01 kg/m   Wt Readings from Last 3 Encounters:  11/14/16 133 lb 6.4 oz (60.5 kg)  11/03/16 194 lb 12.8 oz (88.4 kg)  10/27/16 202 lb (91.6 kg)

## 2016-11-14 NOTE — Progress Notes (Signed)
Please see the Nurse Progress Note in the MD Initial Consult Encounter for this patient. 

## 2016-11-14 NOTE — Progress Notes (Signed)
[facility] Spiritual Care Note  Met with Ashley Moore in infusion per referral from [facility] [place]/RN for emotional support as pt has many stressors (dx/tx, deaths of several close people in the last 1-2 years, and limited support).  Introduced [facility] team/resources, built rapport, provided empathic listening.  During the encounter her affect was more upbeat than the content of her stories suggested.  Brought a prayer shawl as a tangible sign of comfort and encouragement.  Plan to f/u by phone or when pt is on campus, but please also page if immediate needs arise.  Thank you.   [place], [place], [facility] Pager [phone] Voicemail [phone]

## 2016-11-14 NOTE — Progress Notes (Signed)
Consult Note: Gyn-Onc  Consult was requested by Dr. Leighton Ruff for the evaluation of Ashley Moore 66 y.o. female  CC:  Chief Complaint  Patient presents with  . Vaginal mass    Assessment/Plan:  Ashley Moore  is a 66 y.o.  year old with locally advanced anal cancer receiving chemotherapy and radiation primary therapy.  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 They follow the course of lymphatic drainage from the anus to the inguinal region. I do not suspect that they are second primary lesions.   I recommend including the vulva in the radiation treatment fields.  If a subsequent surgery is required, and there is residual macroscopic disease in the vulvar region, I would be happy to assist with vulvectomy. If no gross residual vulvar disease remains after chemoradiation, this would be unlikely to be beneficial.  Recommended Miralax added to stool softener for constipation.   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  The patient reports a year long history of symptoms consistent with anal irritation that she though were hemorrhoids.  She was diagnosed with anal cancer in January 2018 and was noted to have positive inguinal lymph nodes. At the time of Dr Marcello Moores' exam she had separate left labia majora lesions noted.   Since that time she was prescribed definitive primary therapy with chemoradiation. She received her first dose of external beam radiation today and is en route after this appointment for chemotherapy.  She feels that the vulvar lesions have increased in size in the past 3 weeks.  She has substantial pain with sitting. She has significant difficulty passing BM's - no BM for 4 days. She is taking over the counter stool softener for this.  The  patient has no history of HPV related dysplasia or cervical procedures. She does report having had a vulvectomy with Dr Layla Barter at [facility] in the past for Bowen's related dysplasia. She then received treatment with "chemotherapy cream" (likely Aldara or Effudex).   Current Meds:  Outpatient Encounter Prescriptions as of 11/14/2016  Medication Sig  . APPLE CIDER VINEGAR PO Take 1,000 mg by mouth daily.  Marland Kitchen docusate sodium (COLACE) 100 MG capsule Take 100 mg by mouth daily as needed for mild constipation.  . gabapentin (NEURONTIN) 100 MG capsule Take 1 capsule (100 mg total) by mouth 3 (three) times daily.  Marland Kitchen ibuprofen (ADVIL,MOTRIN) 200 MG tablet Take 600 mg by mouth every 6 (six) hours as needed for moderate pain.  . meloxicam (MOBIC) 15 MG tablet TAKE 1 TABLET EVERY DAY (Patient taking differently: TAKE 1 TABLET PO EVERY DAY AS NEEDED FOR PAIN)  . Multiple Vitamin (MULTIVITAMIN) tablet Take 1 tablet by mouth daily.   Marland Kitchen oxyCODONE-acetaminophen (PERCOCET/ROXICET) 5-325 MG tablet Take 1 tablet by mouth every 4 (four) hours as needed for severe pain.  Marland Kitchen prochlorperazine (COMPAZINE) 10 MG tablet Take 1 tablet (10 mg total) by mouth every 6 (six) hours as needed for nausea or vomiting.  . simethicone (MYLICON) 0000000 MG chewable tablet Chew 125 mg by mouth every 6 (six) hours as needed for flatulence.  Marland Kitchen tiZANidine (ZANAFLEX) 4 MG tablet TAKE 1 TABLET EVERY 8 HOURS AS NEEDED FOR MUSCLE SPASM(S)  . traMADol (ULTRAM) 50 MG tablet Take 0.5-1 tablets (25-50 mg total) by mouth every  6 (six) hours as needed for moderate pain.  Marland Kitchen ondansetron (ZOFRAN) 4 MG tablet Take 1 tablet (4 mg total) by mouth every 8 (eight) hours as needed for nausea or vomiting. (Patient not taking: Reported on 11/07/2016)   No facility-administered encounter medications on file as of 11/14/2016.     Allergy:  Allergies  Allergen Reactions  . Benadryl [Diphenhydramine] Other (See Comments)    Tingle all over   . Melatonin      Tingle all over   . Penicillins Rash    Social Hx:   Social History   Social History  . Marital status: Widowed    Spouse name: N/A  . Number of children: 0  . Years of education: N/A   Occupational History  . Not on file.   Social History Main Topics  . Smoking status: Former Research scientist (life sciences)  . Smokeless tobacco: Former Systems developer    Quit date: 09/20/1995  . Alcohol use 0.0 oz/week     Comment: occasional  . Drug use: Yes    Types: Marijuana  . Sexual activity: No   Other Topics Concern  . Not on file   Social History Narrative   H H  of 1      5 pets.   She is a former smoker   Retired Engineer, mining fine arts   Husband smokes he has active heart disease and vascular disease   etoh   Red wine  1 per night.    Tea green tea and earl gray    Moved from [place] to [place] area in 01/05/1984   1 pregnancy   Husband died spring  2016 cv   Sister died 04/06/2015  Bone cancer                 Past Surgical Hx:  Past Surgical History:  Procedure Laterality Date  . BUNIONECTOMY Right   . SKIN CANCER EXCISION     bowens disease    Past Medical Hx:  Past Medical History:  Diagnosis Date  . Bowen's disease    excised 1992  . Headache   . Hx of varicella     Past Gynecological History:  Vulvectomy for dysplasia in the past (remote) No LMP recorded. Patient is postmenopausal.  Family Hx:  Family History  Problem Relation Age of Onset  . Lung cancer Mother     lung  . Pancreatitis Father   . Breast cancer Sister   . Breast cancer Maternal Grandmother     breast   . Breast cancer Maternal Aunt     breast  . Melanoma Sister     Review of Systems:  Constitutional  Feels unwell and in pain  ENT Normal appearing ears and nares bilaterally Skin/Breast  No rash, sores, jaundice, itching, dryness Cardiovascular  No chest pain, shortness of breath, or edema  Pulmonary  No cough or wheeze.  Gastro Intestinal  + constipation Genito Urinary  No frequency,  urgency, dysuria, no bleeding Musculo Skeletal  No myalgia, arthralgia, joint swelling or pain  Neurologic  No weakness, numbness, change in gait,  Psychology  No depression, anxiety, insomnia.   Vitals:  Blood pressure (!) 149/85, pulse 82, temperature 97.8 F (36.6 C), temperature source Oral, resp. rate 18, height 5' 2.5" (1.588 m), weight 192 lb (87.1 kg), SpO2 100 %.  Physical Exam: WD in NAD Neck  Supple NROM, without any enlargements.  Lymph Node Survey No cervical supraclavicular. Bulky bilateral inguinal adenopathy, left>right. Cardiovascular  Pulse normal rate, regularity and rhythm. S1 and S2 normal.  Lungs  Clear to auscultation bilateraly, without wheezes/crackles/rhonchi. Good air movement.  Skin  No rash/lesions/breakdown  Psychiatry  Alert and oriented to person, place, and time  Abdomen  Normoactive bowel sounds, abdomen soft, non-tender and nonobese without evidence of hernia.  Back No CVA tenderness Genito Urinary  Vulva/vagina: 2 bulky raised lesions on hair-bearing left labia majora (largest 3cm). Fleshy colored, non friable. Consistent in appearance with anal lesions. No fixed to underlying pubic bone. Remote from urethral meatus.  Bladder/urethra:  No lesions or masses, well supported bladder  Vagina: normal, no lesions, posterior wall displaced anteriorally by rectal mass.  Cervix: unable to visualize due to posterior pelvic mass, palpably normal  Uterus:  Small, mobile, no parametrial involvement or nodularity.  Adnexa: no palpable masses. Rectal  Deferred. Visually the anal verge circumferentially is replaced by bulky, fleshy tumor.  Extremities  No bilateral cyanosis, clubbing or edema.   Donaciano Eva, MD  11/14/2016, 12:20 PM

## 2016-11-15 ENCOUNTER — Ambulatory Visit
Admission: RE | Admit: 2016-11-15 | Discharge: 2016-11-15 | Disposition: A | Discharge status: Home / Self Care | Payer: Medicare HMO | Source: Ambulatory Visit | Attending: Radiation Oncology | Admitting: Radiation Oncology

## 2016-11-15 DIAGNOSIS — C210 Malignant neoplasm of anus, unspecified: Secondary | ICD-10-CM | POA: Diagnosis not present

## 2016-11-15 DIAGNOSIS — Z87891 Personal history of nicotine dependence: Secondary | ICD-10-CM | POA: Diagnosis not present

## 2016-11-16 ENCOUNTER — Ambulatory Visit
Admission: RE | Admit: 2016-11-16 | Discharge: 2016-11-16 | Disposition: A | Discharge status: Home / Self Care | Payer: Medicare HMO | Source: Ambulatory Visit | Attending: Radiation Oncology | Admitting: Radiation Oncology

## 2016-11-16 ENCOUNTER — Ambulatory Visit: Admission: RE | Admit: 2016-11-16 | Payer: Medicare HMO | Source: Ambulatory Visit

## 2016-11-16 DIAGNOSIS — Z87891 Personal history of nicotine dependence: Secondary | ICD-10-CM | POA: Diagnosis not present

## 2016-11-16 DIAGNOSIS — C210 Malignant neoplasm of anus, unspecified: Secondary | ICD-10-CM | POA: Diagnosis not present

## 2016-11-17 ENCOUNTER — Ambulatory Visit: Payer: Medicare HMO | Admitting: Radiation Oncology

## 2016-11-17 ENCOUNTER — Ambulatory Visit
Admission: RE | Admit: 2016-11-17 | Discharge: 2016-11-17 | Disposition: A | Discharge status: Home / Self Care | Payer: Medicare HMO | Source: Ambulatory Visit | Attending: Radiation Oncology | Admitting: Radiation Oncology

## 2016-11-17 ENCOUNTER — Ambulatory Visit: Payer: Medicare HMO

## 2016-11-17 DIAGNOSIS — Z87891 Personal history of nicotine dependence: Secondary | ICD-10-CM | POA: Diagnosis not present

## 2016-11-17 DIAGNOSIS — C210 Malignant neoplasm of anus, unspecified: Secondary | ICD-10-CM

## 2016-11-17 NOTE — Progress Notes (Signed)
Patient education done 2/3, rectal cancer, gave my business card, sitz bath and Radiation therapy and you book, discussed n,v,d, fatigue, loss appetite, urinary changes, skin irritation, patient having a lot of abdominal fgas, and watery diarrhea,clear stated, took tramadol last night, doesn't last long, needs a stronger pain rx, will address this tomorrow after rad tx , MD to see patient, he is out of office today, she will take her tramadol as soon as she gets home today, in w/c, demonstrated how to use sitz bath, get baby wipes, and a dpray bottle , will reiterate tomorrow side effects again,  11:52 AM

## 2016-11-18 ENCOUNTER — Ambulatory Visit
Admission: RE | Admit: 2016-11-18 | Discharge: 2016-11-18 | Disposition: A | Discharge status: Home / Self Care | Payer: Medicare HMO | Source: Ambulatory Visit | Attending: Radiation Oncology | Admitting: Radiation Oncology

## 2016-11-18 ENCOUNTER — Encounter: Payer: Self-pay | Admitting: General Practice

## 2016-11-18 ENCOUNTER — Other Ambulatory Visit: Payer: Self-pay

## 2016-11-18 ENCOUNTER — Ambulatory Visit (HOSPITAL_BASED_OUTPATIENT_CLINIC_OR_DEPARTMENT_OTHER): Payer: Medicare HMO

## 2016-11-18 ENCOUNTER — Ambulatory Visit: Payer: Self-pay

## 2016-11-18 ENCOUNTER — Telehealth: Payer: Self-pay | Admitting: *Deleted

## 2016-11-18 ENCOUNTER — Encounter: Payer: Self-pay | Admitting: Radiation Oncology

## 2016-11-18 ENCOUNTER — Ambulatory Visit: Payer: Medicare HMO

## 2016-11-18 VITALS — BP 134/79 | HR 89 | Temp 98.4°F | Resp 18

## 2016-11-18 VITALS — BP 118/58 | HR 85 | Temp 98.3°F

## 2016-11-18 DIAGNOSIS — C210 Malignant neoplasm of anus, unspecified: Secondary | ICD-10-CM

## 2016-11-18 DIAGNOSIS — Z87891 Personal history of nicotine dependence: Secondary | ICD-10-CM | POA: Diagnosis not present

## 2016-11-18 DIAGNOSIS — Z452 Encounter for adjustment and management of vascular access device: Secondary | ICD-10-CM | POA: Diagnosis not present

## 2016-11-18 MED ORDER — OXYCODONE-ACETAMINOPHEN 5-325 MG PO TABS: 1.0000 | ORAL_TABLET | Freq: Four times a day (QID) | ORAL | 0 refills | Status: DC | PRN

## 2016-11-18 MED ORDER — SODIUM CHLORIDE 0.9% FLUSH
10.0000 mL | INTRAVENOUS | Status: DC | PRN
  Administered 2016-11-18: 10 mL
  Filled 2016-11-18: qty 10

## 2016-11-18 MED ORDER — HEPARIN SOD (PORK) LOCK FLUSH 100 UNIT/ML IV SOLN
500.0000 [IU] | Freq: Once | INTRAVENOUS | Status: AC | PRN
  Administered 2016-11-18: 500 [IU]
  Filled 2016-11-18: qty 5

## 2016-11-18 MED ORDER — HYDROMORPHONE HCL 1 MG/ML IJ SOLN
1.0000 mg | Freq: Once | INTRAMUSCULAR | Status: AC
  Administered 2016-11-18: 1 mg via INTRAMUSCULAR
  Filled 2016-11-18: qty 1

## 2016-11-18 NOTE — Progress Notes (Signed)
Patient states to possibly have PICC line removed next week.  Patient has appointment with Ned Card, NP and will discuss options at next appointment next week.  Patient had dressing changed today and is scheduled to have dressing change on Monday (11/21/16).  Patient verbalizes understanding of plan.

## 2016-11-18 NOTE — Telephone Encounter (Signed)
Patient taken to linac# 4, then will go to med onc to have her pump d/c;s and picc line removed 2:42 PM

## 2016-11-18 NOTE — Progress Notes (Signed)
Patient c/o pain 8/9/ on 10 scale, per Dr. Lisbeth Renshaw verbal order to give patient Dilaudid 1mg  IM x 1 now", patient given 1mg  dilaudid IM LV Gluteal x 1, patient tolerated well, at 1406 pm At 230pm asked how patient pain level was in her rectal area?"None stated patient" no grimacing, patient smiling and joking now, informed Candace  RT therapist on # 4 linac table, Dr. Lisbeth Renshaw infomremed pain gone, gave rx for percocet to sister 2:30 PM

## 2016-11-18 NOTE — Progress Notes (Addendum)
Ashley Moore has completed 4 fractions to her pelvis.  She reports having pain at a 9/10 in her rectal/anal area with bowel movements.  She did not think she would be able to stay still for treatment today.  She has taken tramadol, Zanaflex and gabapentin. She took a percocet this morning.  She reports having diarrhea constantly today and says it has been like this all week.  She has taken colace and took miralax BID on Monday.  She reports having rectal bleeding last night.  She also reports having gas and is taking Gas-X.  She is having nausea and took a compazine.  BP (!) 118/58 (BP Location: Left Arm, Patient Position: Sitting)   Pulse 85   Temp 98.3 F (36.8 C) (Oral)   SpO2 100%

## 2016-11-18 NOTE — Progress Notes (Signed)
[facility] Spiritual Care Note  Followed up with Ashley Moore and [name], visiting from [place], between appointments this afternoon.   Severe pain was initially a barrier to communication, but additional pain med helped Ashley Moore relax and engage in conversation.  She values calming presence and opportunity to process feelings and hopes.  Per her request for support regarding a specific topic, we plan to meet for an hour next week and will develop a support plan together.   [place], [place], [facility] Pager [phone] Voicemail [phone]

## 2016-11-18 NOTE — Patient Instructions (Signed)

## 2016-11-20 NOTE — Progress Notes (Signed)
Department of Radiation Oncology  Phone:  [phone] Fax:        [phone]  Weekly Treatment Note    Name: Ashley Moore Date: 11/20/2016 MRN: [id number] DOB: March 01, 1951   Diagnosis:     ICD-9-CM ICD-10-CM   1. Anal cancer (HCC) 154.3 C21.0 HYDROmorphone (DILAUDID) injection 1 mg     Current dose: 9 Gy  Current fraction: 5   MEDICATIONS: Current Outpatient Prescriptions  Medication Sig Dispense Refill  . APPLE CIDER VINEGAR PO Take 1,000 mg by mouth daily.    Marland Kitchen docusate sodium (COLACE) 100 MG capsule Take 100 mg by mouth daily as needed for mild constipation.    . gabapentin (NEURONTIN) 100 MG capsule Take 1 capsule (100 mg total) by mouth 3 (three) times daily. 270 capsule 0  . Multiple Vitamin (MULTIVITAMIN) tablet Take 1 tablet by mouth daily.     Marland Kitchen oxyCODONE-acetaminophen (PERCOCET/ROXICET) 5-325 MG tablet Take 1-2 tablets by mouth every 6 (six) hours as needed for severe pain. 120 tablet 0  . oxyCODONE-acetaminophen (PERCOCET/ROXICET) 5-325 MG tablet Take 1 tablet by mouth every 6 (six) hours as needed. Can take 1-2 tablets q 6h prn, dispense 120 tabs Dr. Lisbeth Renshaw, d/c tramadol    . polyethylene glycol (MIRALAX / GLYCOLAX) packet Take 17 g by mouth daily.    . prochlorperazine (COMPAZINE) 10 MG tablet Take 1 tablet (10 mg total) by mouth every 6 (six) hours as needed for nausea or vomiting. 30 tablet 0  . simethicone (MYLICON) 0000000 MG chewable tablet Chew 125 mg by mouth every 6 (six) hours as needed for flatulence.    Marland Kitchen tiZANidine (ZANAFLEX) 4 MG tablet TAKE 1 TABLET EVERY 8 HOURS AS NEEDED FOR MUSCLE SPASM(S) 90 tablet 0  . ibuprofen (ADVIL,MOTRIN) 200 MG tablet Take 600 mg by mouth every 6 (six) hours as needed for moderate pain.    . meloxicam (MOBIC) 15 MG tablet TAKE 1 TABLET EVERY DAY (Patient not taking: Reported on 11/18/2016) 90 tablet 0  . ondansetron (ZOFRAN) 4 MG tablet Take 1 tablet (4 mg total) by mouth every 8 (eight) hours as needed for nausea or  vomiting. (Patient not taking: Reported on 11/18/2016) 30 tablet 0   No current facility-administered medications for this encounter.      ALLERGIES: Benadryl [diphenhydramine]; Melatonin; and Penicillins   LABORATORY DATA:  Lab Results  Component Value Date   WBC 6.4 11/14/2016   HGB 11.7 11/14/2016   HCT 35.5 11/14/2016   MCV 81.9 11/14/2016   PLT 250 11/14/2016   Lab Results  Component Value Date   NA 135 (L) 11/14/2016   K 3.4 (L) 11/14/2016   CL 109 10/24/2016   CO2 24 11/14/2016   Lab Results  Component Value Date   ALT 16 11/14/2016   AST 34 11/14/2016   ALKPHOS 89 11/14/2016   BILITOT 0.33 11/14/2016     NARRATIVE: Ashley Moore was seen today for weekly treatment management. The chart was checked and the patient's films were reviewed.  The patient complains of severe pain today. He has begun taking Percocet on a limited basis and is continuing to take tramadol. I have given her a refill for Percocet with the ability to increase this dose at home. I instructed her to stop taking tramadol.  PHYSICAL EXAMINATION: oral temperature is 98.3 F (36.8 C). Her blood pressure is 118/58 (abnormal) and her pulse is 85. Her oxygen saturation is 100%.        ASSESSMENT: The patient is  doing satisfactorily with treatment although she is having increased pain. She was given dialogue did in our clinic to help with proceeding with her radiation treatment today. I have increased her pain medicine today and have simplified for her, currently taking Percocet as needed.  PLAN: We will continue with the patient's radiation treatment as planned.

## 2016-11-20 NOTE — Progress Notes (Signed)
  Radiation Oncology         [phone]) [phone] ________________________________  Name: Ashley Moore MRN: [id number]  Date: 11/08/2016  DOB: 1951/03/11  SIMULATION AND TREATMENT PLANNING NOTE   DIAGNOSIS:     ICD-9-CM ICD-10-CM   1. Anal cancer ([place]) 154.3 C21.0      CONSENT VERIFIED: yes   SET UP: Patient is set-up supine   IMMOBILIZATION: The following immobilization is used: Customized VAC lock bag. This complex treatment device will be used on a daily basis during the patient's treatment.   Diagnosis: Anal cancer   NARRATIVE: The patient was brought to the [facility]. Identity was confirmed. All relevant records and images related to the planned course of therapy were reviewed. Then, the patient was positioned in a stable reproducible clinical set-up for radiation therapy using a customized vac lock bag. Skin markings were placed. The CT images were loaded into the planning software where the target and avoidance structures were contoured.The radiation prescription was entered and confirmed.   The patient will receive 54 Gy in 30 fractions to the high-dose target region.  Daily image guidance is ordered, and this will be used on a daily basis. This is necessary to ensure accurate and precise localization of the target in addition to accurate alignment of the normal tissue structures in this region. This is particularly important given the possible motion of the high-dose target.  Treatment planning then occurred.   I have requested : Intensity Modulated Radiotherapy (IMRT) is medically necessary for this case for the following reason: Dose homogeneity; the target is in close proximity to critical normal structures, including the femoral heads, bladder, and small bowel. IMRT is thus medically to appropriately treat the patient.   Special treatment procedure  The patient will receive chemotherapy during the course of radiation treatment. The patient may  experience increased or overlapping toxicity due to this combined-modality approach and the patient will be monitored for such problems. This may include extra lab  work as necessary. This therefore constitutes a special treatment procedure.     ________________________________  Jodelle Gross, MD, PhD

## 2016-11-21 ENCOUNTER — Ambulatory Visit (HOSPITAL_BASED_OUTPATIENT_CLINIC_OR_DEPARTMENT_OTHER): Payer: Medicare HMO

## 2016-11-21 ENCOUNTER — Ambulatory Visit
Admission: RE | Admit: 2016-11-21 | Discharge: 2016-11-21 | Disposition: A | Discharge status: Home / Self Care | Payer: Medicare HMO | Source: Ambulatory Visit | Attending: Radiation Oncology | Admitting: Radiation Oncology

## 2016-11-21 DIAGNOSIS — Z95828 Presence of other vascular implants and grafts: Secondary | ICD-10-CM

## 2016-11-21 DIAGNOSIS — C7982 Secondary malignant neoplasm of genital organs: Secondary | ICD-10-CM

## 2016-11-21 DIAGNOSIS — C210 Malignant neoplasm of anus, unspecified: Secondary | ICD-10-CM

## 2016-11-21 DIAGNOSIS — Z87891 Personal history of nicotine dependence: Secondary | ICD-10-CM | POA: Diagnosis not present

## 2016-11-21 MED ORDER — SODIUM CHLORIDE 0.9% FLUSH
10.0000 mL | INTRAVENOUS | Status: DC | PRN
  Administered 2016-11-21: 10 mL via INTRAVENOUS
  Filled 2016-11-21: qty 10

## 2016-11-21 MED ORDER — HEPARIN SOD (PORK) LOCK FLUSH 100 UNIT/ML IV SOLN
500.0000 [IU] | Freq: Once | INTRAVENOUS | Status: AC | PRN
Start: 2016-11-21 — End: 2016-11-21
  Administered 2016-11-21: 250 [IU] via INTRAVENOUS
  Filled 2016-11-21: qty 5

## 2016-11-22 ENCOUNTER — Ambulatory Visit
Admission: RE | Admit: 2016-11-22 | Discharge: 2016-11-22 | Disposition: A | Discharge status: Home / Self Care | Payer: Medicare HMO | Source: Ambulatory Visit | Attending: Radiation Oncology | Admitting: Radiation Oncology

## 2016-11-22 DIAGNOSIS — Z87891 Personal history of nicotine dependence: Secondary | ICD-10-CM | POA: Diagnosis not present

## 2016-11-22 DIAGNOSIS — C210 Malignant neoplasm of anus, unspecified: Secondary | ICD-10-CM | POA: Diagnosis not present

## 2016-11-23 ENCOUNTER — Encounter: Payer: Self-pay | Admitting: Radiation Oncology

## 2016-11-23 ENCOUNTER — Ambulatory Visit
Admission: RE | Admit: 2016-11-23 | Discharge: 2016-11-23 | Disposition: A | Discharge status: Home / Self Care | Payer: Medicare HMO | Source: Ambulatory Visit | Attending: Radiation Oncology | Admitting: Radiation Oncology

## 2016-11-23 DIAGNOSIS — Z87891 Personal history of nicotine dependence: Secondary | ICD-10-CM | POA: Diagnosis not present

## 2016-11-23 DIAGNOSIS — C210 Malignant neoplasm of anus, unspecified: Secondary | ICD-10-CM | POA: Diagnosis not present

## 2016-11-24 ENCOUNTER — Ambulatory Visit (HOSPITAL_BASED_OUTPATIENT_CLINIC_OR_DEPARTMENT_OTHER): Payer: Medicare HMO | Admitting: Nurse Practitioner

## 2016-11-24 ENCOUNTER — Other Ambulatory Visit (HOSPITAL_BASED_OUTPATIENT_CLINIC_OR_DEPARTMENT_OTHER): Payer: Medicare HMO

## 2016-11-24 ENCOUNTER — Other Ambulatory Visit: Payer: Self-pay | Admitting: Radiation Oncology

## 2016-11-24 ENCOUNTER — Ambulatory Visit (HOSPITAL_BASED_OUTPATIENT_CLINIC_OR_DEPARTMENT_OTHER): Payer: Medicare HMO

## 2016-11-24 ENCOUNTER — Ambulatory Visit
Admission: RE | Admit: 2016-11-24 | Discharge: 2016-11-24 | Disposition: A | Discharge status: Home / Self Care | Payer: Medicare HMO | Source: Ambulatory Visit | Attending: Radiation Oncology | Admitting: Radiation Oncology

## 2016-11-24 VITALS — BP 129/80 | HR 85 | Temp 98.2°F | Resp 18 | Ht 62.5 in | Wt 191.8 lb

## 2016-11-24 VITALS — BP 129/80 | HR 85 | Temp 98.2°F | Resp 18

## 2016-11-24 DIAGNOSIS — C210 Malignant neoplasm of anus, unspecified: Secondary | ICD-10-CM | POA: Diagnosis not present

## 2016-11-24 DIAGNOSIS — C7982 Secondary malignant neoplasm of genital organs: Secondary | ICD-10-CM

## 2016-11-24 DIAGNOSIS — Z87891 Personal history of nicotine dependence: Secondary | ICD-10-CM | POA: Diagnosis not present

## 2016-11-24 DIAGNOSIS — D72819 Decreased white blood cell count, unspecified: Secondary | ICD-10-CM | POA: Diagnosis not present

## 2016-11-24 LAB — COMPREHENSIVE METABOLIC PANEL
ALT: 12 U/L (ref 0–55)
ANION GAP: 7 meq/L (ref 3–11)
AST: 26 U/L (ref 5–34)
Albumin: 3.3 g/dL — ABNORMAL LOW (ref 3.5–5.0)
Alkaline Phosphatase: 79 U/L (ref 40–150)
BUN: 17.3 mg/dL (ref 7.0–26.0)
CALCIUM: 9.1 mg/dL (ref 8.4–10.4)
CO2: 26 meq/L (ref 22–29)
Chloride: 104 mEq/L (ref 98–109)
Creatinine: 0.6 mg/dL (ref 0.6–1.1)
Glucose: 100 mg/dl (ref 70–140)
Potassium: 3.6 mEq/L (ref 3.5–5.1)
Sodium: 137 mEq/L (ref 136–145)
TOTAL PROTEIN: 7.1 g/dL (ref 6.4–8.3)

## 2016-11-24 LAB — CBC WITH DIFFERENTIAL/PLATELET
BASO%: 0.4 % (ref 0.0–2.0)
BASOS ABS: 0 10*3/uL (ref 0.0–0.1)
EOS%: 3.1 % (ref 0.0–7.0)
Eosinophils Absolute: 0.1 10*3/uL (ref 0.0–0.5)
HEMATOCRIT: 31.6 % — AB (ref 34.8–46.6)
HGB: 10.1 g/dL — ABNORMAL LOW (ref 11.6–15.9)
LYMPH%: 17.3 % (ref 14.0–49.7)
MCH: 26.3 pg (ref 25.1–34.0)
MCHC: 32 g/dL (ref 31.5–36.0)
MCV: 82.3 fL (ref 79.5–101.0)
MONO#: 0.3 10*3/uL (ref 0.1–0.9)
MONO%: 10.4 % (ref 0.0–14.0)
NEUT#: 1.8 10*3/uL (ref 1.5–6.5)
NEUT%: 68.8 % (ref 38.4–76.8)
PLATELETS: 162 10*3/uL (ref 145–400)
RBC: 3.84 10*6/uL (ref 3.70–5.45)
RDW: 13.1 % (ref 11.2–14.5)
WBC: 2.6 10*3/uL — ABNORMAL LOW (ref 3.9–10.3)
lymph#: 0.5 10*3/uL — ABNORMAL LOW (ref 0.9–3.3)

## 2016-11-24 MED ORDER — SODIUM CHLORIDE 0.9 % IJ SOLN
10.0000 mL | Freq: Once | INTRAMUSCULAR | Status: AC
  Administered 2016-11-24: 10 mL
  Filled 2016-11-24: qty 10

## 2016-11-24 MED ORDER — ONDANSETRON 8 MG PO TBDP: 8.0000 mg | ORAL_TABLET | Freq: Three times a day (TID) | ORAL | 0 refills | Status: DC | PRN

## 2016-11-24 MED ORDER — METRONIDAZOLE 500 MG PO TABS: 500.0000 mg | ORAL_TABLET | Freq: Every day | ORAL | 0 refills | Status: DC

## 2016-11-24 MED ORDER — HEPARIN SOD (PORK) LOCK FLUSH 100 UNIT/ML IV SOLN
250.0000 [IU] | Freq: Once | INTRAVENOUS | Status: AC
  Administered 2016-11-24: 250 [IU] via INTRAVENOUS
  Filled 2016-11-24: qty 5

## 2016-11-24 MED ORDER — MAGIC MOUTHWASH W/LIDOCAINE: 5.0000 mL | Freq: Four times a day (QID) | ORAL | 0 refills | Status: DC | PRN

## 2016-11-24 NOTE — Patient Instructions (Signed)
PICC Home Guide °A peripherally inserted central catheter (PICC) is a long, thin, flexible tube that is inserted into a vein in the upper arm. It is a form of intravenous (IV) access. It is considered to be a "central" line because the tip of the PICC ends in a large vein in your chest. This large vein is called the superior vena cava (SVC). The PICC tip ends in the SVC because there is a lot of blood flow in the SVC. This allows medicines and IV fluids to be quickly distributed throughout the body. The PICC is inserted using a sterile technique by a specially trained nurse or physician. After the PICC is inserted, a chest X-ray exam is done to be sure it is in the correct place. °A PICC may be placed for different reasons, such as: °· To give medicines and liquid nutrition that can only be given through a central line. Examples are: °? Certain antibiotic treatments. °? Chemotherapy. °? Total parenteral nutrition (TPN). °· To take frequent blood samples. °· To give IV fluids and blood products. °· If there is difficulty placing a peripheral intravenous (PIV) catheter. ° °If taken care of properly, a PICC can remain in place for several months. A PICC can also allow a person to go home from the hospital early. Medicine and PICC care can be managed at home by a family member or home health care team. °What problems can happen when I have a PICC? °Problems with a PICC can occasionally occur. These may include the following: °· A blood clot (thrombus) forming in or at the tip of the PICC. This can cause the PICC to become clogged. A clot-dissolving medicine called tissue plasminogen activator (tPA) can be given through the PICC to help break up the clot. °· Inflammation of the vein (phlebitis) in which the PICC is placed. Signs of inflammation may include redness, pain at the insertion site, red streaks, or being able to feel a "cord" in the vein where the PICC is located. °· Infection in the PICC or at the insertion  site. Signs of infection may include fever, chills, redness, swelling, or pus drainage from the PICC insertion site. °· PICC movement (malposition). The PICC tip may move from its original position due to excessive physical activity, forceful coughing, sneezing, or vomiting. °· A break or cut in the PICC. It is important to not use scissors near the PICC. °· Nerve or tendon irritation or injury during PICC insertion. ° °What should I keep in mind about activities when I have a PICC? °· You may bend your arm and move it freely. If your PICC is near or at the bend of your elbow, avoid activity with repeated motion at the elbow. °· Rest at home for the remainder of the day following PICC line insertion. °· Avoid lifting heavy objects as instructed by your health care provider. °· Avoid using a crutch with the arm on the same side as your PICC. You may need to use a walker. °What should I know about my PICC dressing? °· Keep your PICC bandage (dressing) clean and dry to prevent infection. °? Ask your health care provider when you may shower. Ask your health care provider to teach you how to wrap the PICC when you do take a shower. °· Change the PICC dressing as instructed by your health care provider. °· Change your PICC dressing if it becomes loose or wet. °What should I know about PICC care? °· Check the PICC insertion   site daily for leakage, redness, swelling, or pain. °· Do not take a bath, swim, or use hot tubs when you have a PICC. Cover PICC line with clear plastic wrap and tape to keep it dry while showering. °· Flush the PICC as directed by your health care provider. Let your health care provider know right away if the PICC is difficult to flush or does not flush. Do not use force to flush the PICC. °· Do not use a syringe that is less than 10 mL to flush the PICC. °· Never pull or tug on the PICC. °· Avoid blood pressure checks on the arm with the PICC. °· Keep your PICC identification card with you at all  times. °· Do not take the PICC out yourself. Only a trained clinical professional should remove the PICC. °Get help right away if: °· Your PICC is accidentally pulled all the way out. If this happens, cover the insertion site with a bandage or gauze dressing. Do not throw the PICC away. Your health care provider will need to inspect it. °· Your PICC was tugged or pulled and has partially come out. Do not  push the PICC back in. °· There is any type of drainage, redness, or swelling where the PICC enters the skin. °· You cannot flush the PICC, it is difficult to flush, or the PICC leaks around the insertion site when it is flushed. °· You hear a "flushing" sound when the PICC is flushed. °· You have pain, discomfort, or numbness in your arm, shoulder, or jaw on the same side as the PICC. °· You feel your heart "racing" or skipping beats. °· You notice a hole or tear in the PICC. °· You develop chills or a fever. °This information is not intended to replace advice given to you by your health care provider. Make sure you discuss any questions you have with your health care provider. °Document Released: 03/12/2003 Document Revised: 03/25/2016 Document Reviewed: 06/28/2013 °Elsevier Interactive Patient Education © 2017 [employer] ° °

## 2016-11-24 NOTE — Progress Notes (Signed)
40 cm of catheter intact with withdrawal of PICC. Vaseline gauze applied with coflex. Patient in supine position. Patient to be observed for 30 mins.

## 2016-11-24 NOTE — Progress Notes (Signed)
  [place] OFFICE PROGRESS NOTE   Diagnosis:  Anal cancer  INTERVAL HISTORY:   Ashley Moore returns as scheduled. She began radiation and cycle 1 5-FU/mitomycin-C 11/14/2016. She had nausea following the chemotherapy. The nausea has improved that she continues to have nausea mainly in the early morning hours. Compazine is effective. She has a few mouth sores. She is able to eat and drink without difficulty. She notes improvement in stool diameter. She has less rectal pain and bleeding.   Objective:  Vital signs in last 24 hours:  Blood pressure 129/80, pulse 85, temperature 98.2 F (36.8 C), temperature source Oral, resp. rate 18, height 5' 2.5" (1.588 m), weight 191 lb 12.8 oz (87 kg), SpO2 100 %.    HEENT: Ulceration left lower inner lip and left buccal mucosa. Mucous membranes appear moist. Lymphatics: 1 cm right inguinal lymph node, 2 cm left inguinal lymph node. Resp: Lungs clear bilaterally. Cardio: Regular rate and rhythm. GI: Abdomen soft and nontender. No hepatomegaly. Protruding fungating anal mass. Vascular: No leg edema. Skin: Erythema over the labia/perineum/perianal region. Left labia majora with ulcerated lesions.  Right upper extremity PICC without erythema.  Lab Results:  Lab Results  Component Value Date   WBC 2.6 (L) 11/24/2016   HGB 10.1 (L) 11/24/2016   HCT 31.6 (L) 11/24/2016   MCV 82.3 11/24/2016   PLT 162 11/24/2016   NEUTROABS 1.8 11/24/2016    Imaging:  No results found.  Medications: I have reviewed the patient's current medications.  Assessment/Plan: 1. Anal cancer ? CT abdomen/pelvis 10/21/2016-thickening of the anus extending to the junction between the anus and rectum with a large stool ball in the rectum and mild fat stranding posterior to the rectum. Enlarged lymph nodes in the right and left inguinal regions. A few mildly prominent nodes seen posterior to the rectum. ? Biopsy of anal mass 11/01/2016-invasive squamous  cell carcinoma. ? PET scan 31/[id number] hypermetabolic anal mass with hypermetabolic metastases to the groin region bilaterally, left pelvic sidewall and presacral space. ? Initiation of radiation and cycle 1 5-FU/mitomycin C 11/14/2016 2. Left labial lesions. Question direct extension from the anal cancer versus metastatic disease from anal cancer versus a separate malignant process. 3. Pain and bleeding secondary to #1. 4. 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 5. Multiple family members with breast cancer.   Disposition:[name] appears stable. She continues radiation. She completed cycle 1 5-FU/mitomycin C beginning 11/14/2016. She had some nausea and has mild mucositis. We reviewed today's labs. She has mild leukopenia. She understands to contact the office with fever, chills, other signs of infection. We will repeat a CBC in one week. We will see her in follow-up on 12/06/2016. She will contact the office in the interim as outlined above or with any other problems.  PICC line will be removed today.  Plan reviewed with Dr. Benay Spice. 25 minutes were spent face-to-face at today's visit with the majority of that time involved in counseling/coordination of care.    Ned Card ANP/GNP-BC   11/24/2016  3:15 PM

## 2016-11-24 NOTE — Addendum Note (Signed)
Addended by: Jethro Bolus A on: 11/24/2016 04:18 PM   Modules accepted: Orders

## 2016-11-25 ENCOUNTER — Ambulatory Visit
Admission: RE | Admit: 2016-11-25 | Discharge: 2016-11-25 | Disposition: A | Discharge status: Home / Self Care | Payer: Medicare HMO | Source: Ambulatory Visit | Attending: Radiation Oncology | Admitting: Radiation Oncology

## 2016-11-25 ENCOUNTER — Encounter: Payer: Self-pay | Admitting: Radiation Oncology

## 2016-11-25 ENCOUNTER — Other Ambulatory Visit: Payer: Self-pay | Admitting: Neurology

## 2016-11-25 ENCOUNTER — Ambulatory Visit: Admission: RE | Admit: 2016-11-25 | Payer: Medicare HMO | Source: Ambulatory Visit | Admitting: Radiation Oncology

## 2016-11-25 DIAGNOSIS — C210 Malignant neoplasm of anus, unspecified: Secondary | ICD-10-CM | POA: Diagnosis not present

## 2016-11-25 DIAGNOSIS — Z87891 Personal history of nicotine dependence: Secondary | ICD-10-CM | POA: Diagnosis not present

## 2016-11-25 NOTE — Telephone Encounter (Signed)
Rx sent 

## 2016-11-27 ENCOUNTER — Encounter: Payer: Self-pay | Admitting: Radiation Oncology

## 2016-11-27 NOTE — Progress Notes (Signed)
Ashley Moore is a pleasant 66 y.o. 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 She is receiving radiotherapy at this time and reports her symptoms continue to improve since radiation. She's able to pass normal size stool at this time and denies rectal bleeding. She is having mild skin breakdown at the vulva and anus and has noticed a new odor that she describes as foul form the site, where previously she did not have this. She denies fevers, chills, or trouble with chest pain or breathing. She is not using any cream to her skin. We discussed off label use of metronidazole, and this has been called into her pharmacy. She will use witch hazel wipes/spray to keep her skin clean, and keep [place] informed of her progress next week. We did discuss discontinuation of metronidazole if her symptoms didn't improve. She also requests a second opinion from another surgeon if she needs to consider surgery.     Carola Rhine, PAC

## 2016-11-28 ENCOUNTER — Ambulatory Visit
Admission: RE | Admit: 2016-11-28 | Discharge: 2016-11-28 | Disposition: A | Discharge status: Home / Self Care | Payer: Medicare HMO | Source: Ambulatory Visit | Attending: Radiation Oncology | Admitting: Radiation Oncology

## 2016-11-28 ENCOUNTER — Other Ambulatory Visit: Payer: Self-pay | Admitting: Nurse Practitioner

## 2016-11-28 ENCOUNTER — Encounter: Payer: Self-pay | Admitting: General Practice

## 2016-11-28 DIAGNOSIS — C210 Malignant neoplasm of anus, unspecified: Secondary | ICD-10-CM

## 2016-11-28 DIAGNOSIS — Z87891 Personal history of nicotine dependence: Secondary | ICD-10-CM | POA: Diagnosis not present

## 2016-11-28 NOTE — Progress Notes (Signed)
[facility] Spiritual Care Note  Attempted f/u by phone, but pt's VM full. Will continue trying.   [place], [place], [facility] Pager [phone] Voicemail [phone]

## 2016-11-29 ENCOUNTER — Ambulatory Visit
Admission: RE | Admit: 2016-11-29 | Discharge: 2016-11-29 | Disposition: A | Discharge status: Home / Self Care | Payer: Medicare HMO | Source: Ambulatory Visit | Attending: Radiation Oncology | Admitting: Radiation Oncology

## 2016-11-29 DIAGNOSIS — C210 Malignant neoplasm of anus, unspecified: Secondary | ICD-10-CM | POA: Diagnosis not present

## 2016-11-29 DIAGNOSIS — Z87891 Personal history of nicotine dependence: Secondary | ICD-10-CM | POA: Diagnosis not present

## 2016-11-30 ENCOUNTER — Ambulatory Visit
Admission: RE | Admit: 2016-11-30 | Discharge: 2016-11-30 | Disposition: A | Discharge status: Home / Self Care | Payer: Medicare HMO | Source: Ambulatory Visit | Attending: Radiation Oncology | Admitting: Radiation Oncology

## 2016-11-30 ENCOUNTER — Other Ambulatory Visit: Payer: Self-pay | Admitting: *Deleted

## 2016-11-30 DIAGNOSIS — C210 Malignant neoplasm of anus, unspecified: Secondary | ICD-10-CM | POA: Diagnosis not present

## 2016-11-30 DIAGNOSIS — Z87891 Personal history of nicotine dependence: Secondary | ICD-10-CM | POA: Diagnosis not present

## 2016-11-30 MED ORDER — TRAMADOL HCL 50 MG PO TABS: ORAL_TABLET | ORAL | 0 refills | Status: DC

## 2016-12-01 ENCOUNTER — Other Ambulatory Visit (HOSPITAL_BASED_OUTPATIENT_CLINIC_OR_DEPARTMENT_OTHER): Payer: Medicare HMO

## 2016-12-01 ENCOUNTER — Ambulatory Visit
Admission: RE | Admit: 2016-12-01 | Discharge: 2016-12-01 | Disposition: A | Discharge status: Home / Self Care | Payer: Medicare HMO | Source: Ambulatory Visit | Attending: Radiation Oncology | Admitting: Radiation Oncology

## 2016-12-01 DIAGNOSIS — C210 Malignant neoplasm of anus, unspecified: Secondary | ICD-10-CM

## 2016-12-01 DIAGNOSIS — Z87891 Personal history of nicotine dependence: Secondary | ICD-10-CM | POA: Diagnosis not present

## 2016-12-01 LAB — CBC WITH DIFFERENTIAL/PLATELET
BASO%: 0.5 % (ref 0.0–2.0)
Basophils Absolute: 0 10*3/uL (ref 0.0–0.1)
EOS%: 4.3 % (ref 0.0–7.0)
Eosinophils Absolute: 0.1 10*3/uL (ref 0.0–0.5)
HCT: 31.7 % — ABNORMAL LOW (ref 34.8–46.6)
HGB: 10.3 g/dL — ABNORMAL LOW (ref 11.6–15.9)
LYMPH#: 0.3 10*3/uL — AB (ref 0.9–3.3)
LYMPH%: 12.9 % — AB (ref 14.0–49.7)
MCH: 27 pg (ref 25.1–34.0)
MCHC: 32.5 g/dL (ref 31.5–36.0)
MCV: 83 fL (ref 79.5–101.0)
MONO#: 0.6 10*3/uL (ref 0.1–0.9)
MONO%: 27.1 % — AB (ref 0.0–14.0)
NEUT#: 1.2 10*3/uL — ABNORMAL LOW (ref 1.5–6.5)
NEUT%: 55.2 % (ref 38.4–76.8)
Platelets: 114 10*3/uL — ABNORMAL LOW (ref 145–400)
RBC: 3.82 10*6/uL (ref 3.70–5.45)
RDW: 14.1 % (ref 11.2–14.5)
WBC: 2.1 10*3/uL — ABNORMAL LOW (ref 3.9–10.3)
nRBC: 0 % (ref 0–0)

## 2016-12-02 ENCOUNTER — Ambulatory Visit
Admission: RE | Admit: 2016-12-02 | Discharge: 2016-12-02 | Disposition: A | Discharge status: Home / Self Care | Payer: Medicare HMO | Source: Ambulatory Visit | Attending: Radiation Oncology | Admitting: Radiation Oncology

## 2016-12-02 DIAGNOSIS — C210 Malignant neoplasm of anus, unspecified: Secondary | ICD-10-CM | POA: Diagnosis not present

## 2016-12-02 DIAGNOSIS — Z87891 Personal history of nicotine dependence: Secondary | ICD-10-CM | POA: Diagnosis not present

## 2016-12-03 ENCOUNTER — Ambulatory Visit: Payer: Medicare HMO

## 2016-12-04 ENCOUNTER — Other Ambulatory Visit: Payer: Self-pay | Admitting: Oncology

## 2016-12-05 ENCOUNTER — Ambulatory Visit
Admission: RE | Admit: 2016-12-05 | Discharge: 2016-12-05 | Disposition: A | Discharge status: Home / Self Care | Payer: Medicare HMO | Source: Ambulatory Visit | Attending: Radiation Oncology | Admitting: Radiation Oncology

## 2016-12-05 DIAGNOSIS — Z87891 Personal history of nicotine dependence: Secondary | ICD-10-CM | POA: Diagnosis not present

## 2016-12-05 DIAGNOSIS — C210 Malignant neoplasm of anus, unspecified: Secondary | ICD-10-CM | POA: Diagnosis not present

## 2016-12-06 ENCOUNTER — Ambulatory Visit
Admission: RE | Admit: 2016-12-06 | Discharge: 2016-12-06 | Disposition: A | Discharge status: Home / Self Care | Payer: Medicare HMO | Source: Ambulatory Visit | Attending: Radiation Oncology | Admitting: Radiation Oncology

## 2016-12-06 ENCOUNTER — Telehealth: Payer: Self-pay | Admitting: Oncology

## 2016-12-06 ENCOUNTER — Ambulatory Visit (HOSPITAL_BASED_OUTPATIENT_CLINIC_OR_DEPARTMENT_OTHER): Payer: Medicare HMO | Admitting: Oncology

## 2016-12-06 VITALS — BP 122/71 | HR 92 | Temp 98.1°F | Resp 18 | Ht 62.5 in | Wt 186.7 lb

## 2016-12-06 DIAGNOSIS — D709 Neutropenia, unspecified: Secondary | ICD-10-CM | POA: Diagnosis not present

## 2016-12-06 DIAGNOSIS — D6959 Other secondary thrombocytopenia: Secondary | ICD-10-CM | POA: Diagnosis not present

## 2016-12-06 DIAGNOSIS — C210 Malignant neoplasm of anus, unspecified: Secondary | ICD-10-CM | POA: Diagnosis not present

## 2016-12-06 DIAGNOSIS — Z87891 Personal history of nicotine dependence: Secondary | ICD-10-CM | POA: Diagnosis not present

## 2016-12-06 NOTE — Progress Notes (Signed)
  [place] OFFICE PROGRESS NOTE   Diagnosis: Anal cancer  INTERVAL HISTORY:   Ashley Moore returns as scheduled. She continues daily radiation. She reports pain and pruritus at the labia and perineum. She uses oxycodone for pain. She is not using a barrier cream. No pain with urination. She has noted a decrease in the labia and anal masses. Mouth sores resolved after a few days.  Objective:  Vital signs in last 24 hours:  Blood pressure 122/71, pulse 92, temperature 98.1 F (36.7 C), temperature source Oral, resp. rate 18, height 5' 2.5" (1.588 m), weight 186 lb 11.2 oz (84.7 kg), SpO2 100 %.    HEENT: No thrush or ulcers Resp: Lungs clear bilaterally Cardio: Regular rate and rhythm GI: No hepatosplenomegaly, nontender Vascular: No leg edema  Skin: Erythema at the groin/labia, perineum, and upper gluteal folds. Several areas of superficial skin breakdown at the perineum and gluteal folds. The visible tumor at the left anus and left labia have decreased in size.    Lab Results:  Lab Results  Component Value Date   WBC 2.1 (L) 12/01/2016   HGB 10.3 (L) 12/01/2016   HCT 31.7 (L) 12/01/2016   MCV 83.0 12/01/2016   PLT 114 (L) 12/01/2016   NEUTROABS 1.2 (L) 12/01/2016    Medications: I have reviewed the patient's current medications.  Assessment/Plan: 1. Anal cancer ? CT abdomen/pelvis 10/21/2016-thickening of the anus extending to the junction between the anus and rectum with a large stool ball in the rectum and mild fat stranding posterior to the rectum. Enlarged lymph nodes in the right and left inguinal regions. A few mildly prominent nodes seen posterior to the rectum. ? 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 ? PET scan [id number] hypermetabolic anal mass with hypermetabolic metastases to the groin region bilaterally, left pelvic sidewall and presacral space. ? Initiation of radiation and cycle 1 5-FU/mitomycin C  11/14/2016 2. Left labial lesions. Question direct extension from the anal cancer versus metastatic disease from anal cancer versus a separate malignant process. 3. Pain and bleeding secondary to #1. 4. 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 5. Multiple family members with breast cancer.    Disposition:  Ashley Moore is now in the fourth week of radiation for treatment of anal cancer. She will return for the second cycle of chemotherapy on 12/12/2016. She is developing erythema and skin breakdown at the labia and perineum. She is performing sitz baths. She will continue oxycodone as needed. She will discuss the use of a barrier cream with radiation oncology.  She has mild neutropenia and thrombocytopenia secondary to chemotherapy. We will check a CBC prior to cycle 2 chemotherapy on 12/12/2016.  I added Zofran to the antiemetic regimen with day 1 chemotherapy. I does reduce the 5-fluorouracil with cycle 2 secondary to mucositis, diarrhea, and skin breakdown.  Betsy Coder, MD  12/06/2016  10:10 AM

## 2016-12-06 NOTE — Progress Notes (Signed)
Checked patient skin again,  Gave neosporin samples  to use daily  After rtadiation treatments, can buy neosporin with pain relief to try as well, just not 4 hours prior to radiation, patient gave verbal understanding 3:26 PM

## 2016-12-06 NOTE — Telephone Encounter (Signed)
Appointment scheduled per 3.20.18 LOS.

## 2016-12-07 ENCOUNTER — Ambulatory Visit
Admission: RE | Admit: 2016-12-07 | Discharge: 2016-12-07 | Disposition: A | Discharge status: Home / Self Care | Payer: Medicare HMO | Source: Ambulatory Visit | Attending: Radiation Oncology | Admitting: Radiation Oncology

## 2016-12-07 DIAGNOSIS — Z87891 Personal history of nicotine dependence: Secondary | ICD-10-CM | POA: Diagnosis not present

## 2016-12-07 DIAGNOSIS — C210 Malignant neoplasm of anus, unspecified: Secondary | ICD-10-CM | POA: Diagnosis not present

## 2016-12-08 ENCOUNTER — Ambulatory Visit
Admission: RE | Admit: 2016-12-08 | Discharge: 2016-12-08 | Disposition: A | Discharge status: Home / Self Care | Payer: Medicare HMO | Source: Ambulatory Visit | Attending: Radiation Oncology | Admitting: Radiation Oncology

## 2016-12-08 DIAGNOSIS — C210 Malignant neoplasm of anus, unspecified: Secondary | ICD-10-CM | POA: Diagnosis not present

## 2016-12-08 DIAGNOSIS — Z87891 Personal history of nicotine dependence: Secondary | ICD-10-CM | POA: Diagnosis not present

## 2016-12-09 ENCOUNTER — Other Ambulatory Visit: Payer: Self-pay | Admitting: Radiation Oncology

## 2016-12-09 ENCOUNTER — Ambulatory Visit
Admission: RE | Admit: 2016-12-09 | Discharge: 2016-12-09 | Disposition: A | Discharge status: Home / Self Care | Payer: Medicare HMO | Source: Ambulatory Visit | Attending: Radiation Oncology | Admitting: Radiation Oncology

## 2016-12-09 DIAGNOSIS — C210 Malignant neoplasm of anus, unspecified: Secondary | ICD-10-CM | POA: Diagnosis not present

## 2016-12-09 DIAGNOSIS — Z87891 Personal history of nicotine dependence: Secondary | ICD-10-CM | POA: Diagnosis not present

## 2016-12-09 MED ORDER — OXYCODONE HCL ER 10 MG PO T12A: 10.0000 mg | EXTENDED_RELEASE_TABLET | Freq: Two times a day (BID) | ORAL | 0 refills | Status: DC

## 2016-12-10 ENCOUNTER — Ambulatory Visit: Payer: Medicare HMO

## 2016-12-12 ENCOUNTER — Encounter (HOSPITAL_COMMUNITY): Payer: Self-pay | Admitting: Interventional Radiology

## 2016-12-12 ENCOUNTER — Ambulatory Visit (HOSPITAL_BASED_OUTPATIENT_CLINIC_OR_DEPARTMENT_OTHER): Payer: Medicare HMO | Admitting: Nurse Practitioner

## 2016-12-12 ENCOUNTER — Ambulatory Visit (HOSPITAL_COMMUNITY)
Admission: RE | Admit: 2016-12-12 | Discharge: 2016-12-12 | Disposition: A | Discharge status: Home / Self Care | Payer: Medicare HMO | Source: Ambulatory Visit | Attending: Nurse Practitioner | Admitting: Nurse Practitioner

## 2016-12-12 ENCOUNTER — Ambulatory Visit (HOSPITAL_BASED_OUTPATIENT_CLINIC_OR_DEPARTMENT_OTHER): Payer: Medicare HMO

## 2016-12-12 ENCOUNTER — Other Ambulatory Visit: Payer: Self-pay | Admitting: Nurse Practitioner

## 2016-12-12 ENCOUNTER — Other Ambulatory Visit (HOSPITAL_BASED_OUTPATIENT_CLINIC_OR_DEPARTMENT_OTHER): Payer: Medicare HMO

## 2016-12-12 ENCOUNTER — Ambulatory Visit: Payer: Medicare HMO

## 2016-12-12 ENCOUNTER — Telehealth: Payer: Self-pay | Admitting: *Deleted

## 2016-12-12 ENCOUNTER — Other Ambulatory Visit: Payer: Self-pay | Admitting: Radiation Oncology

## 2016-12-12 ENCOUNTER — Ambulatory Visit
Admission: RE | Admit: 2016-12-12 | Discharge: 2016-12-12 | Disposition: A | Discharge status: Home / Self Care | Payer: Medicare HMO | Source: Ambulatory Visit | Attending: Radiation Oncology | Admitting: Radiation Oncology

## 2016-12-12 VITALS — BP 110/68 | HR 81 | Temp 98.0°F | Resp 18 | Ht 62.5 in | Wt 194.4 lb

## 2016-12-12 DIAGNOSIS — Z5111 Encounter for antineoplastic chemotherapy: Secondary | ICD-10-CM

## 2016-12-12 DIAGNOSIS — C210 Malignant neoplasm of anus, unspecified: Secondary | ICD-10-CM | POA: Diagnosis not present

## 2016-12-12 DIAGNOSIS — K1231 Oral mucositis (ulcerative) due to antineoplastic therapy: Secondary | ICD-10-CM

## 2016-12-12 DIAGNOSIS — G893 Neoplasm related pain (acute) (chronic): Secondary | ICD-10-CM | POA: Diagnosis not present

## 2016-12-12 DIAGNOSIS — C7982 Secondary malignant neoplasm of genital organs: Secondary | ICD-10-CM

## 2016-12-12 DIAGNOSIS — Z95828 Presence of other vascular implants and grafts: Secondary | ICD-10-CM

## 2016-12-12 DIAGNOSIS — Z87891 Personal history of nicotine dependence: Secondary | ICD-10-CM | POA: Diagnosis not present

## 2016-12-12 DIAGNOSIS — Z85048 Personal history of other malignant neoplasm of rectum, rectosigmoid junction, and anus: Secondary | ICD-10-CM | POA: Diagnosis not present

## 2016-12-12 HISTORY — PX: IR GENERIC HISTORICAL: IMG1180011

## 2016-12-12 LAB — CBC WITH DIFFERENTIAL/PLATELET
BASO%: 0.3 % (ref 0.0–2.0)
Basophils Absolute: 0 10*3/uL (ref 0.0–0.1)
EOS ABS: 0.3 10*3/uL (ref 0.0–0.5)
EOS%: 6.7 % (ref 0.0–7.0)
HCT: 31.2 % — ABNORMAL LOW (ref 34.8–46.6)
HEMOGLOBIN: 10.3 g/dL — AB (ref 11.6–15.9)
LYMPH%: 3.8 % — AB (ref 14.0–49.7)
MCH: 27.1 pg (ref 25.1–34.0)
MCHC: 32.8 g/dL (ref 31.5–36.0)
MCV: 82.5 fL (ref 79.5–101.0)
MONO#: 0.5 10*3/uL (ref 0.1–0.9)
MONO%: 10.3 % (ref 0.0–14.0)
NEUT%: 78.9 % — ABNORMAL HIGH (ref 38.4–76.8)
NEUTROS ABS: 3.6 10*3/uL (ref 1.5–6.5)
Platelets: 282 10*3/uL (ref 145–400)
RBC: 3.79 10*6/uL (ref 3.70–5.45)
RDW: 14.8 % — AB (ref 11.2–14.5)
WBC: 4.5 10*3/uL (ref 3.9–10.3)
lymph#: 0.2 10*3/uL — ABNORMAL LOW (ref 0.9–3.3)

## 2016-12-12 LAB — URINALYSIS, MICROSCOPIC - CHCC
BILIRUBIN (URINE): NEGATIVE
Glucose: NEGATIVE mg/dL
Ketones: NEGATIVE mg/dL
NITRITE: NEGATIVE
PH: 5 (ref 4.6–8.0)
Protein: NEGATIVE mg/dL
Specific Gravity, Urine: 1.015 (ref 1.003–1.035)
UROBILINOGEN UR: 0.2 mg/dL (ref 0.2–1)

## 2016-12-12 LAB — COMPREHENSIVE METABOLIC PANEL
ALT: 27 U/L (ref 0–55)
AST: 39 U/L — ABNORMAL HIGH (ref 5–34)
Albumin: 3.1 g/dL — ABNORMAL LOW (ref 3.5–5.0)
Alkaline Phosphatase: 64 U/L (ref 40–150)
Anion Gap: 7 mEq/L (ref 3–11)
BUN: 14.5 mg/dL (ref 7.0–26.0)
CO2: 25 mEq/L (ref 22–29)
Calcium: 9 mg/dL (ref 8.4–10.4)
Chloride: 105 mEq/L (ref 98–109)
Creatinine: 0.7 mg/dL (ref 0.6–1.1)
EGFR: >90 mL/min/{1.73_m2} (ref >90)
Glucose: 93 mg/dl (ref 70–140)
Potassium: 4 mEq/L (ref 3.5–5.1)
Sodium: 136 mEq/L (ref 136–145)
Total Bilirubin: <0.22 mg/dL (ref 0.20–1.20)
Total Protein: 6.8 g/dL (ref 6.4–8.3)

## 2016-12-12 MED ORDER — SODIUM CHLORIDE 0.9% FLUSH
10.0000 mL | INTRAVENOUS | Status: DC | PRN
  Filled 2016-12-12: qty 10

## 2016-12-12 MED ORDER — ONDANSETRON HCL 4 MG/2ML IJ SOLN
8.0000 mg | Freq: Once | INTRAMUSCULAR | Status: AC
Start: 2016-12-12 — End: 2016-12-12
  Administered 2016-12-12: 8 mg via INTRAVENOUS

## 2016-12-12 MED ORDER — MITOMYCIN CHEMO IV INJECTION 20 MG
8.1000 mg/m2 | Freq: Once | INTRAVENOUS | Status: AC
  Administered 2016-12-12: 16 mg via INTRAVENOUS
  Filled 2016-12-12: qty 32

## 2016-12-12 MED ORDER — LIDOCAINE-EPINEPHRINE (PF) 2 %-1:200000 IJ SOLN
INTRAMUSCULAR | Status: DC | PRN
  Administered 2016-12-12: 5 mL via INTRADERMAL

## 2016-12-12 MED ORDER — SODIUM CHLORIDE 0.9 % IV SOLN
Freq: Once | INTRAVENOUS | Status: AC
  Administered 2016-12-12: 14:00:00 via INTRAVENOUS

## 2016-12-12 MED ORDER — SODIUM CHLORIDE 0.9 % IV SOLN
800.0000 mg/m2/d | INTRAVENOUS | Status: DC
  Administered 2016-12-12: 6250 mg via INTRAVENOUS
  Filled 2016-12-12: qty 125

## 2016-12-12 MED ORDER — ONDANSETRON HCL 4 MG/2ML IJ SOLN
INTRAMUSCULAR | Status: AC
  Filled 2016-12-12: qty 4

## 2016-12-12 MED ORDER — HEPARIN SOD (PORK) LOCK FLUSH 100 UNIT/ML IV SOLN
500.0000 [IU] | Freq: Once | INTRAVENOUS | Status: DC | PRN
  Filled 2016-12-12: qty 5

## 2016-12-12 MED ORDER — OXYCODONE HCL ER 10 MG PO T12A: 10.0000 mg | EXTENDED_RELEASE_TABLET | Freq: Two times a day (BID) | ORAL | 0 refills | Status: DC

## 2016-12-12 MED ORDER — LIDOCAINE-EPINEPHRINE (PF) 2 %-1:200000 IJ SOLN
INTRAMUSCULAR | Status: AC
  Filled 2016-12-12: qty 20

## 2016-12-12 MED ORDER — SODIUM CHLORIDE 0.9% FLUSH
10.0000 mL | INTRAVENOUS | Status: DC | PRN
  Administered 2016-12-12: 10 mL via INTRAVENOUS
  Filled 2016-12-12: qty 10

## 2016-12-12 NOTE — Progress Notes (Signed)
[facility] OFFICE PROGRESS NOTE   Diagnosis:  Anal cancer  INTERVAL HISTORY:   Ashley Moore returns as scheduled. She continues radiation. She has noted left leg edema for the past 2 weeks. The edema worsened over the past week. She notes improvement with elevation. The leg feels "hot". She had multiple loose stools last night. Prior to the loose stools she had had no bowel movement for 4-5 days. She has noted significant improvement in pain since beginning OxyContin. She continues Percocet as needed.  Objective:  Vital signs in last 24 hours:  Blood pressure 110/68, pulse 81, temperature 98 F (36.7 C), temperature source Oral, resp. rate 18, height 5' 2.5" (1.588 m), weight 194 lb 6.4 oz (88.2 kg), SpO2 100 %.    HEENT: No thrush or ulcers. Resp: Lungs clear bilaterally. Cardio: Regular rate and rhythm. GI: Abdomen soft and nontender. No organomegaly. Vascular: Pitting edema below the knees bilaterally left greater than right. Faint erythema left lower leg. Calves are nontender. Neuro: Alert and oriented.  Skin: Erythema at the groin/labia, perineum and upper gluteal fold. There are several areas of superficial skin breakdown at the perineum. Anal and left labial tumors have improved.    Lab Results:  Lab Results  Component Value Date   WBC 4.5 12/12/2016   HGB 10.3 (L) 12/12/2016   HCT 31.2 (L) 12/12/2016   MCV 82.5 12/12/2016   PLT 282 12/12/2016   NEUTROABS 3.6 12/12/2016    Imaging:  Ir Fluoro Guide Cv Line Right  Result Date: 12/12/2016 INDICATION: Poor venous access. History of anal cancer. In need of intravenous access for short course chemotherapy administration. EXAM: ULTRASOUND AND FLUOROSCOPIC GUIDED PICC LINE INSERTION MEDICATIONS: None. CONTRAST:  None FLUOROSCOPY TIME:  6 seconds (1.1 MGy) COMPLICATIONS: None immediate. TECHNIQUE: The procedure, risks, benefits, and alternatives were explained to the patient and informed written consent was  obtained. A timeout was performed prior to the initiation of the procedure. The right upper extremity was prepped with chlorhexidine in a sterile fashion, and a sterile drape was applied covering the operative field. Maximum barrier sterile technique with sterile gowns and gloves were used for the procedure. A timeout was performed prior to the initiation of the procedure. Local anesthesia was provided with 1% lidocaine. Under direct ultrasound guidance, the right brachial vein was accessed with a micropuncture kit after the overlying soft tissues were anesthetized with 1% lidocaine. An ultrasound image was saved for documentation purposes. A guidewire was advanced to the level of the superior caval-atrial junction for measurement purposes and the PICC line was cut to length. A peel-away sheath was placed and a 40 cm, 5 [place], single lumen was inserted to level of the superior caval-atrial junction. A post procedure spot fluoroscopic was obtained. The catheter easily aspirated and flushed and was sutured in place. A dressing was placed. The patient tolerated the procedure well without immediate post procedural complication. FINDINGS: After catheter placement, the tip lies within the superior cavoatrial junction. The catheter aspirates and flushes normally and is ready for immediate use. IMPRESSION: Successful ultrasound and fluoroscopic guided placement of a right brachial vein approach, 40 cm, 5 [place], single lumen PICC with tip at the superior caval-atrial junction. The PICC line is ready for immediate use. Electronically Signed   By: [name] M.D.   On: 12/12/2016 11:57   Ir US Guide Vasc Access Right  Result Date: 12/12/2016 INDICATION: Poor venous access. History of anal cancer. In need of intravenous access for short  course chemotherapy administration. EXAM: ULTRASOUND AND FLUOROSCOPIC GUIDED PICC LINE INSERTION MEDICATIONS: None. CONTRAST:  None FLUOROSCOPY TIME:  6 seconds (1.1 MGy) COMPLICATIONS:  None immediate. TECHNIQUE: The procedure, risks, benefits, and alternatives were explained to the patient and informed written consent was obtained. A timeout was performed prior to the initiation of the procedure. The right upper extremity was prepped with chlorhexidine in a sterile fashion, and a sterile drape was applied covering the operative field. Maximum barrier sterile technique with sterile gowns and gloves were used for the procedure. A timeout was performed prior to the initiation of the procedure. Local anesthesia was provided with 1% lidocaine. Under direct ultrasound guidance, the right brachial vein was accessed with a micropuncture kit after the overlying soft tissues were anesthetized with 1% lidocaine. An ultrasound image was saved for documentation purposes. A guidewire was advanced to the level of the superior caval-atrial junction for measurement purposes and the PICC line was cut to length. A peel-away sheath was placed and a 40 cm, 5 [place], single lumen was inserted to level of the superior caval-atrial junction. A post procedure spot fluoroscopic was obtained. The catheter easily aspirated and flushed and was sutured in place. A dressing was placed. The patient tolerated the procedure well without immediate post procedural complication. FINDINGS: After catheter placement, the tip lies within the superior cavoatrial junction. The catheter aspirates and flushes normally and is ready for immediate use. IMPRESSION: Successful ultrasound and fluoroscopic guided placement of a right brachial vein approach, 40 cm, 5 [place], single lumen PICC with tip at the superior caval-atrial junction. The PICC line is ready for immediate use. Electronically Signed   By: [name] M.D.   On: 12/12/2016 11:57    Medications: I have reviewed the patient's current medications.  Assessment/Plan: 1. Anal cancer ? CT abdomen/pelvis 10/21/2016-thickening of the anus extending to the junction between the anus  and rectum with a large stool ball in the rectum and mild fat stranding posterior to the rectum. Enlarged lymph nodes in the right and left inguinal regions. A few mildly prominent nodesseen posterior to the rectum. ? 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 ? PET scan [id number] hypermetabolic anal mass with hypermetabolic metastases to the groin region bilaterally, left pelvic sidewall and presacral space. ? Initiation of radiation and cycle 1 5-FU/mitomycin C 11/14/2016 ? Cycle 2 5-FU/mitomycin C 12/12/2016 (5-FU dose reduced due to mucositis, diarrhea, skin breakdown) 2. Left labial lesions. Question direct extension from the anal cancer versus metastatic disease from anal cancer versus a separate malignant process. 3. Pain and bleeding secondary to #1. 4. 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 5. Multiple family members with breast cancer.   Disposition: Ashley Moore appears stable. Plan to proceed with cycle 2 5-FU/mitomycin-C today as scheduled. The 5-fluorouracil has been dose reduced secondary to mucositis, diarrhea and skin breakdown.  She has developed bilateral lower extremity edema left greater than right. I am referring her for venous Doppler studies to rule out DVT.  She will return for labs on 12/22/2016. We will see her in follow-up on 12/26/2016. She will contact the office in the interim with any problems.  Plan reviewed with Dr. Benay Spice.    Ned Card ANP/GNP-BC   12/12/2016  2:07 PM

## 2016-12-12 NOTE — Patient Instructions (Signed)
[facility] Discharge Instructions for Patients Receiving Chemotherapy  Today you received the following chemotherapy agents: Mitomycin and Adrucil   To help prevent nausea and vomiting after your treatment, we encourage you to take your nausea medication as directed.    If you develop nausea and vomiting that is not controlled by your nausea medication, call the clinic.   BELOW ARE SYMPTOMS THAT SHOULD BE REPORTED IMMEDIATELY:  *FEVER GREATER THAN 100.5 F  *CHILLS WITH OR WITHOUT FEVER  NAUSEA AND VOMITING THAT IS NOT CONTROLLED WITH YOUR NAUSEA MEDICATION  *UNUSUAL SHORTNESS OF BREATH  *UNUSUAL BRUISING OR BLEEDING  TENDERNESS IN MOUTH AND THROAT WITH OR WITHOUT PRESENCE OF ULCERS  *URINARY PROBLEMS  *BOWEL PROBLEMS  UNUSUAL RASH Items with * indicate a potential emergency and should be followed up as soon as possible.  Feel free to call the clinic you have any questions or concerns. The clinic phone number is [phone].  Please show the [facility] at check-in to the Emergency Department and triage nurse.

## 2016-12-12 NOTE — Procedures (Signed)
Successful placement of right brachial vein approach 40 cm single lumen PICC line with tip at the superior caval-atrial junction.   EBL: None No immediate post procedural complication. The PICC line is ready for immediate use.  Ronny Bacon, MD Pager #: [phone]

## 2016-12-12 NOTE — Patient Instructions (Addendum)
PICC Home Guide °A peripherally inserted central catheter (PICC) is a long, thin, flexible tube that is inserted into a vein in the upper arm. It is a form of intravenous (IV) access. It is considered to be a "central" line because the tip of the PICC ends in a large vein in your chest. This large vein is called the superior vena cava (SVC). The PICC tip ends in the SVC because there is a lot of blood flow in the SVC. This allows medicines and IV fluids to be quickly distributed throughout the body. The PICC is inserted using a sterile technique by a specially trained nurse or physician. After the PICC is inserted, a chest X-ray exam is done to be sure it is in the correct place. °A PICC may be placed for different reasons, such as: °· To give medicines and liquid nutrition that can only be given through a central line. Examples are: °? Certain antibiotic treatments. °? Chemotherapy. °? Total parenteral nutrition (TPN). °· To take frequent blood samples. °· To give IV fluids and blood products. °· If there is difficulty placing a peripheral intravenous (PIV) catheter. ° °If taken care of properly, a PICC can remain in place for several months. A PICC can also allow a person to go home from the hospital early. Medicine and PICC care can be managed at home by a family member or home health care team. °What problems can happen when I have a PICC? °Problems with a PICC can occasionally occur. These may include the following: °· A blood clot (thrombus) forming in or at the tip of the PICC. This can cause the PICC to become clogged. A clot-dissolving medicine called tissue plasminogen activator (tPA) can be given through the PICC to help break up the clot. °· Inflammation of the vein (phlebitis) in which the PICC is placed. Signs of inflammation may include redness, pain at the insertion site, red streaks, or being able to feel a "cord" in the vein where the PICC is located. °· Infection in the PICC or at the insertion  site. Signs of infection may include fever, chills, redness, swelling, or pus drainage from the PICC insertion site. °· PICC movement (malposition). The PICC tip may move from its original position due to excessive physical activity, forceful coughing, sneezing, or vomiting. °· A break or cut in the PICC. It is important to not use scissors near the PICC. °· Nerve or tendon irritation or injury during PICC insertion. ° °What should I keep in mind about activities when I have a PICC? °· You may bend your arm and move it freely. If your PICC is near or at the bend of your elbow, avoid activity with repeated motion at the elbow. °· Rest at home for the remainder of the day following PICC line insertion. °· Avoid lifting heavy objects as instructed by your health care provider. °· Avoid using a crutch with the arm on the same side as your PICC. You may need to use a walker. °What should I know about my PICC dressing? °· Keep your PICC bandage (dressing) clean and dry to prevent infection. °? Ask your health care provider when you may shower. Ask your health care provider to teach you how to wrap the PICC when you do take a shower. °· Change the PICC dressing as instructed by your health care provider. °· Change your PICC dressing if it becomes loose or wet. °What should I know about PICC care? °· Check the PICC insertion   site daily for leakage, redness, swelling, or pain. °· Do not take a bath, swim, or use hot tubs when you have a PICC. Cover PICC line with clear plastic wrap and tape to keep it dry while showering. °· Flush the PICC as directed by your health care provider. Let your health care provider know right away if the PICC is difficult to flush or does not flush. Do not use force to flush the PICC. °· Do not use a syringe that is less than 10 mL to flush the PICC. °· Never pull or tug on the PICC. °· Avoid blood pressure checks on the arm with the PICC. °· Keep your PICC identification card with you at all  times. °· Do not take the PICC out yourself. Only a trained clinical professional should remove the PICC. °Get help right away if: °· Your PICC is accidentally pulled all the way out. If this happens, cover the insertion site with a bandage or gauze dressing. Do not throw the PICC away. Your health care provider will need to inspect it. °· Your PICC was tugged or pulled and has partially come out. Do not  push the PICC back in. °· There is any type of drainage, redness, or swelling where the PICC enters the skin. °· You cannot flush the PICC, it is difficult to flush, or the PICC leaks around the insertion site when it is flushed. °· You hear a "flushing" sound when the PICC is flushed. °· You have pain, discomfort, or numbness in your arm, shoulder, or jaw on the same side as the PICC. °· You feel your heart "racing" or skipping beats. °· You notice a hole or tear in the PICC. °· You develop chills or a fever. °This information is not intended to replace advice given to you by your health care provider. Make sure you discuss any questions you have with your health care provider. °Document Released: 03/12/2003 Document Revised: 03/25/2016 Document Reviewed: 06/28/2013 °Elsevier Interactive Patient Education © 2017 [employer] ° °

## 2016-12-12 NOTE — Telephone Encounter (Signed)
Called [facility] pharmacy after telephone call  From med onc dept,  Judson Roch, , her insurance doesn't cover OXycontin, per pharmacist,patient bought 7 days worth, will ned to see what does cover before Dr. Lisbeth Renshaw can write another rx, asked to send patient down stairs, will e-mail Lauren Tarry Kos, Johnnye Lana, and Loren Racer, our social workers to see if they can help her with tthis 1:53 PM

## 2016-12-13 ENCOUNTER — Ambulatory Visit
Admission: RE | Admit: 2016-12-13 | Discharge: 2016-12-13 | Disposition: A | Discharge status: Home / Self Care | Payer: Medicare HMO | Source: Ambulatory Visit | Attending: Radiation Oncology | Admitting: Radiation Oncology

## 2016-12-13 ENCOUNTER — Ambulatory Visit (HOSPITAL_COMMUNITY)
Admission: RE | Admit: 2016-12-13 | Discharge: 2016-12-13 | Disposition: A | Discharge status: Home / Self Care | Payer: Medicare HMO | Source: Ambulatory Visit | Attending: Nurse Practitioner | Admitting: Nurse Practitioner

## 2016-12-13 ENCOUNTER — Telehealth: Payer: Self-pay | Admitting: *Deleted

## 2016-12-13 DIAGNOSIS — C210 Malignant neoplasm of anus, unspecified: Secondary | ICD-10-CM | POA: Diagnosis not present

## 2016-12-13 DIAGNOSIS — M79606 Pain in leg, unspecified: Secondary | ICD-10-CM | POA: Diagnosis not present

## 2016-12-13 DIAGNOSIS — M7989 Other specified soft tissue disorders: Secondary | ICD-10-CM | POA: Diagnosis not present

## 2016-12-13 DIAGNOSIS — Z87891 Personal history of nicotine dependence: Secondary | ICD-10-CM | POA: Diagnosis not present

## 2016-12-13 NOTE — Telephone Encounter (Signed)
Received phone call from [place] in Vascular Lab- Doppler study was negative for DVT. Message forwarded to Ned Card, NP.  Pt was discharged from the Vascular lab.

## 2016-12-13 NOTE — Progress Notes (Signed)
VASCULAR LAB PRELIMINARY  PRELIMINARY  PRELIMINARY  PRELIMINARY  Bilateral lower extremity venous duplex completed.    Preliminary report:  Bilateral:  No evidence of DVT, superficial thrombosis, or Baker's Cyst.   Josemiguel Gries, RVS 12/13/2016, 2:06 PM

## 2016-12-14 ENCOUNTER — Other Ambulatory Visit: Payer: Self-pay | Admitting: Radiation Oncology

## 2016-12-14 ENCOUNTER — Telehealth: Payer: Self-pay | Admitting: *Deleted

## 2016-12-14 ENCOUNTER — Ambulatory Visit
Admission: RE | Admit: 2016-12-14 | Discharge: 2016-12-14 | Disposition: A | Discharge status: Home / Self Care | Payer: Medicare HMO | Source: Ambulatory Visit | Attending: Radiation Oncology | Admitting: Radiation Oncology

## 2016-12-14 DIAGNOSIS — C210 Malignant neoplasm of anus, unspecified: Secondary | ICD-10-CM | POA: Diagnosis not present

## 2016-12-14 DIAGNOSIS — Z87891 Personal history of nicotine dependence: Secondary | ICD-10-CM | POA: Diagnosis not present

## 2016-12-14 LAB — URINE CULTURE

## 2016-12-14 NOTE — Telephone Encounter (Signed)
Called patient to inform of PT appt.on 12-15-16- arrival time- 10:45 am @ [facility], spoke with patient and she is aware of this appt.

## 2016-12-15 ENCOUNTER — Encounter: Payer: Self-pay | Admitting: Physical Therapy

## 2016-12-15 ENCOUNTER — Other Ambulatory Visit: Payer: Self-pay | Admitting: Radiation Oncology

## 2016-12-15 ENCOUNTER — Ambulatory Visit
Admission: RE | Admit: 2016-12-15 | Discharge: 2016-12-15 | Disposition: A | Discharge status: Home / Self Care | Payer: Medicare HMO | Source: Ambulatory Visit | Attending: Radiation Oncology | Admitting: Radiation Oncology

## 2016-12-15 ENCOUNTER — Ambulatory Visit: Payer: Medicare HMO | Attending: Radiation Oncology | Admitting: Physical Therapy

## 2016-12-15 VITALS — BP 124/77 | HR 92 | Temp 98.4°F | Resp 20

## 2016-12-15 DIAGNOSIS — C210 Malignant neoplasm of anus, unspecified: Secondary | ICD-10-CM | POA: Diagnosis not present

## 2016-12-15 DIAGNOSIS — M542 Cervicalgia: Secondary | ICD-10-CM

## 2016-12-15 DIAGNOSIS — I890 Lymphedema, not elsewhere classified: Secondary | ICD-10-CM

## 2016-12-15 DIAGNOSIS — Z87891 Personal history of nicotine dependence: Secondary | ICD-10-CM | POA: Diagnosis not present

## 2016-12-15 MED ORDER — OXYCODONE HCL 10 MG PO TABS: 10.0000 mg | ORAL_TABLET | ORAL | 0 refills | Status: DC | PRN

## 2016-12-15 MED ORDER — MORPHINE SULFATE 4 MG/ML IJ SOLN
2.0000 mg | Freq: Once | INTRAMUSCULAR | Status: AC
  Administered 2016-12-15: 4 mg via INTRAMUSCULAR
  Filled 2016-12-15: qty 1

## 2016-12-15 MED ORDER — HYDROMORPHONE HCL 1 MG/ML IJ SOLN
1.0000 mg | Freq: Once | INTRAMUSCULAR | Status: AC
Start: 2016-12-15 — End: 2016-12-15
  Administered 2016-12-15: 1 mg via INTRAMUSCULAR
  Filled 2016-12-15: qty 1

## 2016-12-15 NOTE — Therapy (Signed)
[place], [place], [address] Phone: [phone]   Fax:  [phone]  Physical Therapy Evaluation  Patient Details  Name: Ashley Moore MRN: [id number] Date of Birth: Jul 25, 1951 Referring Provider: Shona Simpson, NP  Encounter Date: 12/15/2016      PT End of Session - 12/15/16 1204    Visit Number 1   Number of Visits 12   Date for PT Re-Evaluation 01/12/17   PT Start Time 1102   PT Stop Time 1245   PT Time Calculation (min) 103 min   Activity Tolerance Patient tolerated treatment well   Behavior During Therapy [facility] for tasks assessed/performed      Past Medical History:  Diagnosis Date  . Bowen's disease    excised 1992  . Headache   . Hx of varicella     Past Surgical History:  Procedure Laterality Date  . BUNIONECTOMY Right   . IR GENERIC HISTORICAL  11/14/2016   IR US GUIDE VASC ACCESS RIGHT 11/14/2016 [facility]  . IR GENERIC HISTORICAL  11/14/2016   IR FLUORO GUIDE CV LINE RIGHT 11/14/2016 [facility]  . IR GENERIC HISTORICAL  12/12/2016   IR US GUIDE VASC ACCESS RIGHT 12/12/2016 [name], MD [facility]  . IR GENERIC HISTORICAL  12/12/2016   IR FLUORO GUIDE CV LINE RIGHT 12/12/2016 [name], MD [facility]  . SKIN CANCER EXCISION     bowens disease    There were no vitals filed for this visit.       Subjective Assessment - 12/15/16 1114    Subjective Patient began having swelling in BLE 2 weeks ago. Feet began swelling first and then it crept up her legs. She also began having electrical pain in her calves up her legs into her thighs. Swelling is mostly from her toes to hre knees but some mild swelling in thighs with left being worse than right.   Patient is accompained by: Family member   Pertinent History Diagnosed with anal cancer 6 weeks ago but reports being misdiagnosed by 3 doctors over the past year as having hemorrhoids. They began chemo and radiation  simultaneously about a month ago. 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   How long can you sit comfortably? Few minutes   Patient Stated Goals Reduce leg swelling   Currently in Pain? Yes   Pain Score 7    Pain Location Rectum   Pain Orientation Lower   Pain Descriptors / Indicators Sharp   Pain Type Acute pain   Pain Onset Yesterday   Pain Frequency Constant   Aggravating Factors  Bowel movements   Pain Relieving Factors Pain medications   Multiple Pain Sites No            [facility] PT Assessment - 12/15/16 0001      Assessment   Medical Diagnosis Anal cancer; lymphedema   Referring Provider Shona Simpson, NP   Onset Date/Surgical Date 11/24/16   Hand Dominance Left   Next MD Visit today   Prior Therapy none     Precautions   Precautions Other (comment)   Precaution Comments active cancer; currently in chemo and XRT     Restrictions   Weight Bearing Restrictions No     Balance Screen   Has the patient fallen in the past 6 months No   Has the patient had a decrease in activity level because of a fear of falling?  No   Is the patient reluctant to  leave their home because of a fear of falling?  No     Home Environment   Living Environment Private residence   Living Arrangements Alone   Available Help at Discharge Family   Type of [place] to enter     Prior Function   Level of Independence Independent   Vocation Full time employment   Vocation Requirements Owns gas station   Leisure Unable to exercise     Cognition   Overall Cognitive Status Within Functional Limits for tasks assessed     Posture/Postural Control   Posture/Postural Control Postural limitations   Postural Limitations Rounded Shoulders;Forward head     Palpation   Palpation comment Pitting edema present bilatela dorsal feet and left anterior lower leg           LYMPHEDEMA/ONCOLOGY QUESTIONNAIRE - 12/15/16 1133      Type   Cancer Type Anal cancer      Treatment   Active Chemotherapy Treatment Yes   Date 11/16/16   Past Chemotherapy Treatment No   Active Radiation Treatment Yes   Date 11/16/16   Body Site rectum   Past Radiation Treatment No   Current Hormone Treatment No   Past Hormone Therapy No     What other symptoms do you have   Are you Having Heaviness or Tightness Yes   Are you having Pain Yes   Are you having pitting edema Yes   Body Site dorsal feet   Is it Hard or Difficult finding clothes that fit Yes   Do you have infections No   Is there Decreased scar mobility No   Stemmer Sign No   Other Symptoms n     Lymphedema Stage   Stage STAGE 2 SPONTANEOUSLY IRREVERSIBLE     Lymphedema Assessments   Lymphedema Assessments Lower extremities     Right Lower Extremity Lymphedema   10 cm Proximal to Suprapatella 51.7 cm   At Midpatella/Popliteal Crease 42.8 cm   30 cm Proximal to Floor at Lateral Plantar Foot 42.3 cm   20 cm Proximal to Floor at Lateral Plantar Foot 33.7 1   10  cm Proximal to Floor at Lateral Malleoli 22.8 cm   5 cm Proximal to 1st MTP Joint 22.7 cm   Across MTP Joint 21.5 cm   Around Proximal Great Toe 7.4 cm     Left Lower Extremity Lymphedema   10 cm Proximal to Suprapatella 50.2 cm   At Midpatella/Popliteal Crease 40.7 cm   30 cm Proximal to Floor at Lateral Plantar Foot 43.6 cm   20 cm Proximal to Floor at Lateral Plantar Foot 34.7 cm   10 cm Proximal to Floor at Lateral Malleoli 23.5 cm   5 cm Proximal to 1st MTP Joint 23.8 cm   Across MTP Joint 23.7 cm   Around Proximal Great Toe 7.3 cm                [facility] Adult PT Treatment/Exercise - 12/15/16 0001      Manual Therapy   Manual Therapy Compression Bandaging   Manual therapy comments Right leg had to be unwrapped and rewrapped due to discomfort. Patient had to stop in middle of bandaging to go to the bathroom and again at the end which was very painful for her.   Compression Bandaging Compression bandaging to BLE in supine with  legs elevated: lotion, stockinette, elastomull on first 3 toes, Artiflex on lower legs and feet; Comprilan compression bandages from  feet to knees.                PT Education - 12/15/16 1204    Education provided Yes   Education Details Instructed pt's sister and pt to doff bandages if pain or other symptoms occur   Person(s) Educated Patient;Caregiver(s)   Methods Explanation   Comprehension Verbalized understanding                Long Term Clinic Goals - 12/15/16 1209      CC Long Term Goal  #1   Title Patient will verbalize understanding of lymphedema including treatment and maintenance.   Time 4   Period Weeks   Status New     CC Long Term Goal  #2   Title Reduce bilateral legs to </= 22 cm at 5 cm proximal to 5th MTP.   Time 4   Period Weeks   Status New     CC Long Term Goal  #3   Title Reduce bilateral legs to </= 35 cm at 20 cm proximal to floor at lateral left leg.   Time 4   Period Weeks   Status New     CC Long Term Goal  #4   Title Patient will verbalize where and how to be fitted for compression garments for BLE.   Time 4   Period Weeks   Status New            Plan - 12/15/16 1205    Clinical Impression Statement Patient is a very pleasant but weak 66 y.o. woman who is getting chemo and radiation concurrently for anal cancer. Bilateral leg swelling began 2-3 weeks ago. Dopplers ruled out DVTs and she has been diagnosed with lymphedema. she will benefit from PT to reduce and manage swelling and teach her self maintenance of lymphedema. Due to the evolving nature of her condition with being in active treatment, the extent of her pain and bowel incontinence interfering with therapy, her eval is of moderate complexity.   Rehab Potential Good   Clinical Impairments Affecting Rehab Potential Extent of disease   PT Frequency 3x / week   PT Duration 4 weeks   PT Treatment/Interventions ADLs/Self Care Home Management;Patient/family  education;Compression bandaging;Manual lymph drainage;Manual techniques   PT Next Visit Plan complete decongestive therapy - continue bandaging and begin manual lymph drainage   Consulted and Agree with Plan of Care Patient;Family member/caregiver   Family Member Consulted Sister      Patient will benefit from skilled therapeutic intervention in order to improve the following deficits and impairments:  Increased edema, Pain, Decreased knowledge of precautions, Decreased knowledge of use of DME, Difficulty walking, Postural dysfunction  Visit Diagnosis: Lymphedema, not elsewhere classified - Plan: PT plan of care cert/re-cert      G-Codes - 51/02/58 1215    Functional Assessment Tool Used (Outpatient Only) Clinical Judgement   Functional Limitation Other PT primary   Other PT Primary Current Status (N2778) At least 60 percent but less than 80 percent impaired, limited or restricted   Other PT Primary Goal Status (E4235) At least 20 percent but less than 40 percent impaired, limited or restricted       Problem List Patient Active Problem List   Diagnosis Date Noted  . Port catheter in place 11/21/2016  . 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 Anal cancer ([place]) 11/03/2016  . Cystitis 05/06/2015  . Degenerative cervical disc 04/08/2015  . Trigger point of neck 03/26/2015  .  Pain in joint, ankle and foot 04/23/2013  . Visit for preventive health examination 02/13/2013  . Routine gynecological examination 02/13/2013  . Family history of breast cancer in first degree relative 02/13/2013  . Rash and nonspecific skin eruption 02/13/2013  . 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 VITAMIN D DEFICIENCY 06/25/2008  . NECK PAIN 06/02/2008  . FOOT PAIN 06/02/2008  . OSTEOPENIA 06/02/2008    Annia Friendly, PT 12/15/16 12:49 PM  [place], [place], [address] Phone: [phone]   Fax:   [phone]  Name: Ashley Moore MRN: [id number] Date of Birth: Feb 02, 1951

## 2016-12-15 NOTE — Progress Notes (Signed)
Patient pain in rectal area trying to have bowel movements, small formed stools , pain 10/10,crying, and breathing short, ppurse lip breathing encouraged, stayed with patyient, she will try dermablast spray and given OXY IR5mg  for breakthrough pain, continue oXycontin q 12 hours if not effective can increas Oxycontin to  Every 8 hours,patient and sister both aware 4:04 PM

## 2016-12-15 NOTE — Progress Notes (Signed)
Was going to give 1,g IM dilaudid,  Verbal order Dr. Lisbeth Renshaw, patient pain 10/10  Rectal, scanned and was, then patient stated"oh no, I threw up last time very bad", new order verbal to give 2mg  IM Morphine per Shona Simpson, PA-C, wasted 1mg  Dilaudid with Elmo Putt RN, and wasted 2mg  Morphine also with Elmo Putt RN, called pagrmacy and explained about the diladsudi, have made a progess note 3:54 PM

## 2016-12-16 ENCOUNTER — Telehealth: Payer: Self-pay | Admitting: Oncology

## 2016-12-16 ENCOUNTER — Other Ambulatory Visit: Payer: Self-pay | Admitting: Radiation Oncology

## 2016-12-16 ENCOUNTER — Ambulatory Visit
Admission: RE | Admit: 2016-12-16 | Discharge: 2016-12-16 | Disposition: A | Discharge status: Home / Self Care | Payer: Medicare HMO | Source: Ambulatory Visit | Attending: Radiation Oncology | Admitting: Radiation Oncology

## 2016-12-16 ENCOUNTER — Ambulatory Visit (HOSPITAL_BASED_OUTPATIENT_CLINIC_OR_DEPARTMENT_OTHER): Payer: Medicare HMO

## 2016-12-16 VITALS — BP 117/53 | HR 87 | Temp 98.4°F | Resp 18

## 2016-12-16 DIAGNOSIS — C210 Malignant neoplasm of anus, unspecified: Secondary | ICD-10-CM | POA: Diagnosis not present

## 2016-12-16 DIAGNOSIS — Z87891 Personal history of nicotine dependence: Secondary | ICD-10-CM | POA: Diagnosis not present

## 2016-12-16 DIAGNOSIS — Z452 Encounter for adjustment and management of vascular access device: Secondary | ICD-10-CM

## 2016-12-16 MED ORDER — PROCHLORPERAZINE MALEATE 10 MG PO TABS: 10.0000 mg | ORAL_TABLET | Freq: Four times a day (QID) | ORAL | 0 refills | Status: DC | PRN

## 2016-12-16 MED ORDER — PHENAZOPYRIDINE HCL 200 MG PO TABS: 200.0000 mg | ORAL_TABLET | Freq: Three times a day (TID) | ORAL | 1 refills | Status: DC | PRN

## 2016-12-16 MED ORDER — SODIUM CHLORIDE 0.9% FLUSH
10.0000 mL | INTRAVENOUS | Status: DC | PRN
  Administered 2016-12-16: 10 mL
  Filled 2016-12-16: qty 10

## 2016-12-16 NOTE — Telephone Encounter (Signed)
Confirmed added April appts per LOS. Pt will pick up schedule at today's visit

## 2016-12-16 NOTE — Progress Notes (Signed)
PICC removal 1644 by Carolanne Grumbling, RN observed by Rosalio Macadamia, RN. Pressure held for 23min.

## 2016-12-16 NOTE — Progress Notes (Signed)
Observed for 30 minutes after PICC removal, Vital signs obtained and discharged home.

## 2016-12-16 NOTE — Patient Instructions (Signed)
PICC Removal, Care After Refer to this sheet in the next few weeks. These instructions provide you with information on caring for yourself after your procedure. Your health care provider may also give you more specific instructions. Your treatment has been planned according to current medical practices, but problems sometimes occur. Call your health care provider if you have any problems or questions after your procedure. What can I expect after the procedure? After your procedure, it is typical to have mild discomfort at the insertion site. This should not last for more than a day. Follow these instructions at home: You may remove the bandage after 24 hours. The PICC insertion site is very small. A small scab may develop over the insertion site. It is okay to wash the site gently with soap and water. Be careful not to remove or pick off the scab. Gently pat the site dry after washing it. You do not need to put another bandage over the insertion site. Do not lift anything heavy or do strenuous physical activity for 24 hours after the PICC is removed. This includes:  Weight lifting.  Strenuous yard work.  Any physical activity with repetitive arm movement.  Contact a health care provider if:  You have swelling or puffiness in your arm at the PICC insertion site.  You have increasing tenderness at the PICC insertion site. Get help right away if:  You have numbness or tingling in your fingers, hand, or arm.  Your arm looks blue and feels cold.  You have redness around the insertion site or a red streak goes up your arm.  You have any type of drainage from the PICC insertion site. This includes drainage such as: ? Bleeding from the insertion site. If this happens, apply firm, direct pressure to the PICC insertion site with a clean towel. ? Drainage that is yellow or tan.  You have a fever. This information is not intended to replace advice given to you by your health care provider. Make  sure you discuss any questions you have with your health care provider. Document Released: 09/10/2013 Document Revised: 02/11/2016 Document Reviewed: 06/28/2013 Elsevier Interactive Patient Education  2017 Elsevier Inc.  

## 2016-12-19 ENCOUNTER — Other Ambulatory Visit: Payer: Self-pay | Admitting: Neurology

## 2016-12-19 ENCOUNTER — Encounter: Payer: Self-pay | Admitting: Radiation Oncology

## 2016-12-19 ENCOUNTER — Ambulatory Visit
Admission: RE | Admit: 2016-12-19 | Discharge: 2016-12-19 | Disposition: A | Discharge status: Home / Self Care | Payer: Medicare HMO | Source: Ambulatory Visit | Attending: Radiation Oncology | Admitting: Radiation Oncology

## 2016-12-19 ENCOUNTER — Ambulatory Visit: Payer: Medicare HMO | Attending: Radiation Oncology

## 2016-12-19 DIAGNOSIS — C210 Malignant neoplasm of anus, unspecified: Secondary | ICD-10-CM | POA: Diagnosis not present

## 2016-12-19 DIAGNOSIS — I890 Lymphedema, not elsewhere classified: Secondary | ICD-10-CM | POA: Diagnosis not present

## 2016-12-19 DIAGNOSIS — Z87891 Personal history of nicotine dependence: Secondary | ICD-10-CM | POA: Diagnosis not present

## 2016-12-19 NOTE — Therapy (Signed)
[place], [place], [address] Phone: [phone]   Fax:  [phone]  Physical Therapy Treatment  Patient Details  Name: Ashley Moore MRN: [id number] Date of Birth: 1951/03/28 Referring Provider: Shona Simpson, NP  Encounter Date: 12/19/2016      PT End of Session - 12/19/16 1236    Visit Number 2   Number of Visits 12   Date for PT Re-Evaluation 01/12/17   PT Start Time 1108   PT Stop Time 1228   PT Time Calculation (min) 80 min   Activity Tolerance Patient tolerated treatment well   Behavior During Therapy [facility] for tasks assessed/performed      Past Medical History:  Diagnosis Date  . Bowen's disease    excised 1992  . Headache   . Hx of varicella     Past Surgical History:  Procedure Laterality Date  . BUNIONECTOMY Right   . IR GENERIC HISTORICAL  11/14/2016   IR US GUIDE VASC ACCESS RIGHT 11/14/2016 [facility]  . IR GENERIC HISTORICAL  11/14/2016   IR FLUORO GUIDE CV LINE RIGHT 11/14/2016 [facility]  . IR GENERIC HISTORICAL  12/12/2016   IR US GUIDE VASC ACCESS RIGHT 12/12/2016 [name], MD [facility]  . IR GENERIC HISTORICAL  12/12/2016   IR FLUORO GUIDE CV LINE RIGHT 12/12/2016 [name], MD [facility]  . SKIN CANCER EXCISION     bowens disease    There were no vitals filed for this visit.      Subjective Assessment - 12/19/16 1112    Subjective Took bandages off Saturday, they had slid down and were causing alot of pain at my Lt ankle which tendsto hurt more since starting radiation, but htat made it hurt more. My legs had reduced alot though when I took them off and I elevated my legs as much as I could over the weekend.    Patient is accompained by: Family member   Pertinent History Diagnosed with anal cancer 6 weeks ago but reports being misdiagnosed by 3 doctors over the past year as having hemorrhoids. They began chemo and radiation simultaneously about a  month ago. 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   How long can you sit comfortably? Few minutes   Patient Stated Goals Reduce leg swelling   Currently in Pain? Yes   Pain Score 4    Pain Location Ankle   Pain Orientation Left   Pain Descriptors / Indicators Dull   Pain Type Acute pain   Pain Onset In the past 7 days   Aggravating Factors  the bandages didn't help but it's been hurting more since radiation   Pain Relieving Factors pain meds                         [facility] Adult PT Treatment/Exercise - 12/19/16 0001      Manual Therapy   Manual Therapy Manual Lymphatic Drainage (MLD);Compression Bandaging   Manual therapy comments --   Manual Lymphatic Drainage (MLD) In Supine: Short neck, 5 diaphragmatic breaths, Rt axillary nodes, rt inguino-axillary anastomosis, then Rt LE from lateral hip to dorsal foot working proximally to distal then retracing all steps; then same on Lt side.    Compression Bandaging Compression bandaging to Bil LE in supine with legs elevated: Biotone lotion, thick stockinette, elastomull on first 3 toes, Artiflex on lower legs and feet with 1/2' gray foam at lateral Lt ankle; Comprilan compression  bandages from feet to knees trying to wrap Lt foot a little looser as pt had pain from first time being wrapped.                 PT Education - 12/19/16 1235    Education provided Yes   Education Details Reminded pt to remove bandages if she has increased pain again at her Lt ankle and her and sister verbalized understanding.    Person(s) Educated Patient;Caregiver(s)   Methods Explanation   Comprehension Verbalized understanding                Long Term Clinic Goals - 12/15/16 1209      CC Long Term Goal  #1   Title Patient will verbalize understanding of lymphedema including treatment and maintenance.   Time 4   Period Weeks   Status New     CC Long Term Goal  #2   Title Reduce bilateral legs to </= 22 cm at 5 cm  proximal to 5th MTP.   Time 4   Period Weeks   Status New     CC Long Term Goal  #3   Title Reduce bilateral legs to </= 35 cm at 20 cm proximal to floor at lateral left leg.   Time 4   Period Weeks   Status New     CC Long Term Goal  #4   Title Patient will verbalize where and how to be fitted for compression garments for BLE.   Time 4   Period Weeks   Status New            Plan - 12/19/16 1237    Clinical Impression Statement Pt reported she had increased Lt ankle pain with bandages and took them off Saturday (she forgot she could take them off if they hurt before that so reminded her and sister of that today). Pt and sister were instructed in basics of anatomy of lymphatic system today and manual lymph drainage which they seemed to have a good understanding of after. When rebandaging Lt LE today took extra precautions to try to make it more comfortale for pt with wrapping slightly looser and added foam to lateral malleolus where she had most pain. She reported bandages feeling good after session and no pain.    Rehab Potential Good   Clinical Impairments Affecting Rehab Potential Extent of disease   PT Frequency 1x / week   PT Duration 4 weeks   PT Treatment/Interventions ADLs/Self Care Home Management;Patient/family education;Compression bandaging;Manual lymph drainage;Manual techniques   PT Next Visit Plan complete decongestive therapy - continue bandaging and begin manual lymph drainage; assess if Lt foot/ankle was more comfortable this time   Consulted and Agree with Plan of Care Patient   Family Member Consulted Sister      Patient will benefit from skilled therapeutic intervention in order to improve the following deficits and impairments:  Increased edema, Pain, Decreased knowledge of precautions, Decreased knowledge of use of DME, Difficulty walking, Postural dysfunction  Visit Diagnosis: Lymphedema, not elsewhere classified     Problem List Patient Active  Problem List   Diagnosis Date Noted  . Port catheter in place 11/21/2016  . 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 Anal cancer ([place]) 11/03/2016  . Cystitis 05/06/2015  . Degenerative cervical disc 04/08/2015  . Trigger point of neck 03/26/2015  . Pain in joint, ankle and foot 04/23/2013  . Visit for preventive health examination 02/13/2013  . Routine gynecological examination  02/13/2013  . Family history of breast cancer in first degree relative 02/13/2013  . Rash and nonspecific skin eruption 02/13/2013  . 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 VITAMIN D DEFICIENCY 06/25/2008  . NECK PAIN 06/02/2008  . FOOT PAIN 06/02/2008  . OSTEOPENIA 06/02/2008    Otelia Limes, PTA 12/19/2016, 12:41 PM  [place], [place], [address] Phone: [phone]   Fax:  [phone]  Name: Ashley Moore MRN: [id number] Date of Birth: 1951-04-14

## 2016-12-19 NOTE — Progress Notes (Signed)
I spoke with the patient and her sister at the machine today. Ms. Eckroth continues to have rectal pain and lower extremity edema. She went today to have her legs re-wrapped after she was able to shower over the weekend. She's pleased with the results of downsizing her lymphedema. We spent time talking about her pain medication regimen. She's been taking 10 mg of oxycodone every 2-3 hours and we discussed changing her long acting oxycodone

## 2016-12-20 ENCOUNTER — Encounter: Payer: Self-pay | Admitting: Radiation Oncology

## 2016-12-20 ENCOUNTER — Ambulatory Visit
Admission: RE | Admit: 2016-12-20 | Discharge: 2016-12-20 | Disposition: A | Discharge status: Home / Self Care | Payer: Medicare HMO | Source: Ambulatory Visit | Attending: Radiation Oncology | Admitting: Radiation Oncology

## 2016-12-20 VITALS — BP 118/71 | HR 90 | Temp 98.4°F | Resp 16

## 2016-12-20 DIAGNOSIS — C210 Malignant neoplasm of anus, unspecified: Secondary | ICD-10-CM

## 2016-12-20 DIAGNOSIS — Z87891 Personal history of nicotine dependence: Secondary | ICD-10-CM | POA: Diagnosis not present

## 2016-12-20 MED ORDER — LORAZEPAM 0.5 MG PO TABS
ORAL_TABLET | ORAL | Status: AC
  Filled 2016-12-20: qty 1

## 2016-12-20 MED ORDER — LORAZEPAM 0.5 MG PO TABS
0.5000 mg | ORAL_TABLET | Freq: Once | ORAL | Status: AC
  Administered 2016-12-20: 0.5 mg via ORAL
  Filled 2016-12-20: qty 1

## 2016-12-20 NOTE — Progress Notes (Signed)
I met with the patient and her sister at the machine today. She is anxious and nauseated. She has continued to need Oxycodone 10 mg q2 hours prn pain as well as making changes to oxycontin. She requests a change in her long acting medication and we discussed Fentanyl patch every 3 days. Calculating her narcotic demands currently she is taking about 210 mg of morphine per day, and this works out to a 50 mcg Fentanyl patch. She was given precautions for this and we will continue to follow this expectantly. We will also start Silvadene as well.

## 2016-12-21 ENCOUNTER — Ambulatory Visit
Admission: RE | Admit: 2016-12-21 | Discharge: 2016-12-21 | Disposition: A | Discharge status: Home / Self Care | Payer: Medicare HMO | Source: Ambulatory Visit | Attending: Radiation Oncology | Admitting: Radiation Oncology

## 2016-12-21 ENCOUNTER — Telehealth: Payer: Self-pay | Admitting: *Deleted

## 2016-12-21 ENCOUNTER — Ambulatory Visit: Payer: Medicare HMO

## 2016-12-21 DIAGNOSIS — C210 Malignant neoplasm of anus, unspecified: Secondary | ICD-10-CM

## 2016-12-21 DIAGNOSIS — I890 Lymphedema, not elsewhere classified: Secondary | ICD-10-CM

## 2016-12-21 DIAGNOSIS — Z87891 Personal history of nicotine dependence: Secondary | ICD-10-CM | POA: Diagnosis not present

## 2016-12-21 MED ORDER — SILVER SULFADIAZINE 1 % EX CREA: TOPICAL_CREAM | Freq: Every day | CUTANEOUS | Status: DC

## 2016-12-21 MED ORDER — SILVER SULFADIAZINE 1 % EX CREA
TOPICAL_CREAM | Freq: Every day | CUTANEOUS | Status: DC
  Administered 2016-12-21 (×2): via TOPICAL
  Filled 2016-12-21: qty 50

## 2016-12-21 NOTE — Telephone Encounter (Signed)
error 

## 2016-12-21 NOTE — Therapy (Signed)
[place], [place], [address] Phone: [phone]   Fax:  [phone]  Physical Therapy Treatment  Patient Details  Name: Ashley Moore MRN: [id number] Date of Birth: February 18, 1951 Referring Provider: Shona Simpson, NP  Encounter Date: 12/21/2016      PT End of Session - 12/21/16 1014    Visit Number 3   Number of Visits 12   Date for PT Re-Evaluation 01/12/17   PT Start Time 0846   PT Stop Time 1005   PT Time Calculation (min) 79 min   Activity Tolerance Patient tolerated treatment well   Behavior During Therapy [facility] for tasks assessed/performed      Past Medical History:  Diagnosis Date  . Bowen's disease    excised 1992  . Headache   . Hx of varicella     Past Surgical History:  Procedure Laterality Date  . BUNIONECTOMY Right   . IR GENERIC HISTORICAL  11/14/2016   IR US GUIDE VASC ACCESS RIGHT 11/14/2016 [facility]  . IR GENERIC HISTORICAL  11/14/2016   IR FLUORO GUIDE CV LINE RIGHT 11/14/2016 [facility]  . IR GENERIC HISTORICAL  12/12/2016   IR US GUIDE VASC ACCESS RIGHT 12/12/2016 [name], MD [facility]  . IR GENERIC HISTORICAL  12/12/2016   IR FLUORO GUIDE CV LINE RIGHT 12/12/2016 [name], MD [facility]  . SKIN CANCER EXCISION     bowens disease    There were no vitals filed for this visit.      Subjective Assessment - 12/21/16 0853    Subjective The bandages felt better this time but I had to take them off yesterday morning just because I was hurting all over my body. Yesterday was a really bad day. When I was at radiation yesterday they changed my meds adding lorazepam and zofran. I'm not having any pain right now but I am medicated!   Patient is accompained by: Family member   Pertinent History Diagnosed with anal cancer 6 weeks ago but reports being misdiagnosed by 3 doctors over the past year as having hemorrhoids. They began chemo and radiation simultaneously  about a month ago. 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   How long can you sit comfortably? Few minutes   Patient Stated Goals Reduce leg swelling   Currently in Pain? No/denies               LYMPHEDEMA/ONCOLOGY QUESTIONNAIRE - 12/21/16 0855      Right Lower Extremity Lymphedema   10 cm Proximal to Suprapatella 51.8 cm   At Midpatella/Popliteal Crease 42.8 cm   30 cm Proximal to Floor at Lateral Plantar Foot 41.7 cm   20 cm Proximal to Floor at Lateral Plantar Foot 34.2 1   10  cm Proximal to Floor at Lateral Malleoli 24.3 cm   5 cm Proximal to 1st MTP Joint 22.2 cm   Across MTP Joint 21.7 cm   Around Proximal Great Toe 7 cm     Left Lower Extremity Lymphedema   10 cm Proximal to Suprapatella 52.5 cm   At Midpatella/Popliteal Crease 42.2 cm   30 cm Proximal to Floor at Lateral Plantar Foot 42.4 cm   20 cm Proximal to Floor at Lateral Plantar Foot 34.3 cm   10 cm Proximal to Floor at Lateral Malleoli 25.1 cm   5 cm Proximal to 1st MTP Joint 22.7 cm   Across MTP Joint 22.7 cm   Around Proximal Great Toe 7.2 cm  [facility] Adult PT Treatment/Exercise - 12/21/16 0001      Manual Therapy   Manual Therapy Manual Lymphatic Drainage (MLD);Compression Bandaging   Manual Lymphatic Drainage (MLD) In Supine: Short neck, 5 diaphragmatic breaths, Rt axillary nodes, rt inguino-axillary anastomosis, then Rt LE from lateral hip to dorsal foot working proximally to distal then retracing all steps; then same on Lt side.    Compression Bandaging Compression bandaging to Bil LE in supine with legs elevated: Biotone lotion, thick stockinette, elastomull on first 3 toes, Artiflex on lower legs and feet with 1/2" gray foam x2, one at lateral and medial Lt malleouli; Comprilan compression bandages from feet to knees trying to wrap Lt foot a little looser as pt had pain from first time being wrapped.                         Long Term Clinic Goals -  12/15/16 1209      CC Long Term Goal  #1   Title Patient will verbalize understanding of lymphedema including treatment and maintenance.   Time 4   Period Weeks   Status New     CC Long Term Goal  #2   Title Reduce bilateral legs to </= 22 cm at 5 cm proximal to 5th MTP.   Time 4   Period Weeks   Status New     CC Long Term Goal  #3   Title Reduce bilateral legs to </= 35 cm at 20 cm proximal to floor at lateral left leg.   Time 4   Period Weeks   Status New     CC Long Term Goal  #4   Title Patient will verbalize where and how to be fitted for compression garments for BLE.   Time 4   Period Weeks   Status New            Plan - 12/21/16 1015    Clinical Impression Statement Pt was in severe pain and had uncontrollable diarrhea yesterday per sisters report. They were at radiation for hours as they had to wait for her to stop having BM's until they could do treatment. They changed her meds and she reports no pain today and was asleep for most of treatment in her medicated state today. She 3 treatments of radiation left planning on finishing end of this week. Pt took bandages off yesterday morning due to intense pain overall she was experiencing (bandages felt ok but with pt goin got the bathroom alot she wanted them off) so reapplied same today except added another piece of gray foam to medial Lt ankle as well. Walked pt out to car as she was a little unsteady on her feet after sleeping through treatment and due to being on pain meds. Sister drive pt home. Her circumference measurements were some increased some decreased today, but this is to be expected as she hasn't been able to leave them on consistently yet due to other discomfort. Expect pt to start being able to tolerate them more in the upcoming 1-2 weeks as she begins to slowly recover from radiation.   Rehab Potential Good   Clinical Impairments Affecting Rehab Potential Extent of disease   PT Frequency 3x / week   PT  Duration 4 weeks   PT Treatment/Interventions ADLs/Self Care Home Management;Patient/family education;Compression bandaging;Manual lymph drainage;Manual techniques   PT Next Visit Plan complete decongestive therapy - continue bandaging and begin manual lymph drainage; assess if Lt foot/ankle  was more comfortable this time   Consulted and Agree with Plan of Care Patient      Patient will benefit from skilled therapeutic intervention in order to improve the following deficits and impairments:  Increased edema, Pain, Decreased knowledge of precautions, Decreased knowledge of use of DME, Difficulty walking, Postural dysfunction  Visit Diagnosis: Lymphedema, not elsewhere classified     Problem List Patient Active Problem List   Diagnosis Date Noted  . Port catheter in place 11/21/2016  . 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 Anal cancer ([place]) 11/03/2016  . Cystitis 05/06/2015  . Degenerative cervical disc 04/08/2015  . Trigger point of neck 03/26/2015  . Pain in joint, ankle and foot 04/23/2013  . Visit for preventive health examination 02/13/2013  . Routine gynecological examination 02/13/2013  . Family history of breast cancer in first degree relative 02/13/2013  . Rash and nonspecific skin eruption 02/13/2013  . 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 VITAMIN D DEFICIENCY 06/25/2008  . NECK PAIN 06/02/2008  . FOOT PAIN 06/02/2008  . OSTEOPENIA 06/02/2008    Otelia Limes, PTA 12/21/2016, 10:19 AM  [place], [place], [address] Phone: [phone]   Fax:  [phone]  Name: Ashley Moore MRN: [id number] Date of Birth: [birth year]

## 2016-12-21 NOTE — Progress Notes (Signed)
2 50 gm jars of silvadene to give to the patient when she comes in for rad tx, to apply to affected skin breakdown daily, must wash off before re-applying cream 6:52 AM

## 2016-12-22 ENCOUNTER — Other Ambulatory Visit: Payer: Self-pay | Admitting: Radiation Oncology

## 2016-12-22 ENCOUNTER — Other Ambulatory Visit (HOSPITAL_BASED_OUTPATIENT_CLINIC_OR_DEPARTMENT_OTHER): Payer: Medicare HMO

## 2016-12-22 ENCOUNTER — Ambulatory Visit
Admission: RE | Admit: 2016-12-22 | Discharge: 2016-12-22 | Disposition: A | Discharge status: Home / Self Care | Payer: Medicare HMO | Source: Ambulatory Visit | Attending: Radiation Oncology | Admitting: Radiation Oncology

## 2016-12-22 ENCOUNTER — Encounter: Payer: Self-pay | Admitting: Radiation Oncology

## 2016-12-22 VITALS — BP 121/74 | HR 78 | Temp 97.8°F | Resp 18 | Wt 188.2 lb

## 2016-12-22 DIAGNOSIS — Z87891 Personal history of nicotine dependence: Secondary | ICD-10-CM | POA: Diagnosis not present

## 2016-12-22 DIAGNOSIS — C210 Malignant neoplasm of anus, unspecified: Secondary | ICD-10-CM

## 2016-12-22 LAB — BASIC METABOLIC PANEL
Anion Gap: 9 mEq/L (ref 3–11)
BUN: 11.6 mg/dL (ref 7.0–26.0)
CHLORIDE: 99 meq/L (ref 98–109)
CO2: 25 mEq/L (ref 22–29)
CREATININE: 0.6 mg/dL (ref 0.6–1.1)
Calcium: 8.9 mg/dL (ref 8.4–10.4)
Glucose: 103 mg/dl (ref 70–140)
Potassium: 3.2 mEq/L — ABNORMAL LOW (ref 3.5–5.1)
SODIUM: 133 meq/L — AB (ref 136–145)

## 2016-12-22 LAB — CBC WITH DIFFERENTIAL/PLATELET
BASO%: 0 % (ref 0.0–2.0)
Basophils Absolute: 0 10*3/uL (ref 0.0–0.1)
EOS ABS: 0 10*3/uL (ref 0.0–0.5)
EOS%: 2.3 % (ref 0.0–7.0)
HCT: 31 % — ABNORMAL LOW (ref 34.8–46.6)
HGB: 10.1 g/dL — ABNORMAL LOW (ref 11.6–15.9)
LYMPH%: 8.7 % — AB (ref 14.0–49.7)
MCH: 27.2 pg (ref 25.1–34.0)
MCHC: 32.6 g/dL (ref 31.5–36.0)
MCV: 83.3 fL (ref 79.5–101.0)
MONO#: 0.7 10*3/uL (ref 0.1–0.9)
MONO%: 39.3 % — AB (ref 0.0–14.0)
NEUT%: 49.7 % (ref 38.4–76.8)
NEUTROS ABS: 0.9 10*3/uL — AB (ref 1.5–6.5)
Platelets: 224 10*3/uL (ref 145–400)
RBC: 3.72 10*6/uL (ref 3.70–5.45)
RDW: 15.7 % — AB (ref 11.2–14.5)
WBC: 1.7 10*3/uL — AB (ref 3.9–10.3)
lymph#: 0.2 10*3/uL — ABNORMAL LOW (ref 0.9–3.3)

## 2016-12-22 MED ORDER — OXYCODONE HCL 10 MG PO TABS: 10.0000 mg | ORAL_TABLET | ORAL | 0 refills | Status: DC | PRN

## 2016-12-22 MED ORDER — SILVER SULFADIAZINE 1 % EX CREA
TOPICAL_CREAM | Freq: Two times a day (BID) | CUTANEOUS | Status: DC
  Administered 2016-12-22: 15:00:00 via TOPICAL
  Filled 2016-12-22: qty 50

## 2016-12-22 NOTE — Progress Notes (Addendum)
Weekly rad txs pelvis 29/30 completed, pain I s 4/5 on 10 scale at present, do able stated,  , has started a fentanyl 70mcg patch for increased pain, takes Oxycodone prn breakthrough, and has ativan for nausea, using silvadene in b/l groin area and buttocks where moist desquamation is, gave a 400gm jar today  She completes tomorrow  And have given a 01/05/17 follow up with [facility] or if needed can come sooner Wt Readings from Last 3 Encounters:  12/12/16 194 lb 6.4 oz (88.2 kg)  12/06/16 186 lb 11.2 oz (84.7 kg)  12/05/16 186 lb 6.4 oz (84.6 kg)  BP 121/74 (BP Location: Left Arm, Patient Position: Sitting, Cuff Size: Normal)   Pulse 78   Temp 97.8 F (36.6 C) (Oral)   Resp 18   Wt 188 lb 3.2 oz (85.4 kg)   BMI 33.87 kg/m

## 2016-12-22 NOTE — Progress Notes (Signed)
  Radiation Oncology         [phone] ________________________________  Name: Ashley Moore MRN: [id number]  Date: 12/22/2016  DOB: 30-May-1951  Weekly Radiation Therapy Management    ICD-9-CM ICD-10-CM   1. Anal cancer (HCC) 154.3 C21.0      Current Dose: 52.2 Gy     Planned Dose:  54 Gy  Narrative . . . . . . . . The patient presents for routine under treatment assessment. Pt endorses 4 or 5 out of 10 on pain scale after having begun fentanyl patch 50 mcg. Pt takes oxycodone prn for breakthrough pain. She also reports taking ativan for nausea. Pt endorses silvadene in groin bilaterally. She endorses blood clot "the size of a quarter" that discharged from her vagina. Pt has been using pyridium for urinary discomfort, though she continues to endorse some pain when urinating.                           The patient is otherwise without complaint.                                 Set-up films were reviewed.                                 The chart was checked. Physical Findings. . .  weight is 188 lb 3.2 oz (85.4 kg). Her oral temperature is 97.8 F (36.6 C). Her blood pressure is 121/74 and her pulse is 78. Her respiration is 18. . Weight essentially stable.  No significant changes. Lungs are clear to auscultation bilaterally. Heart has regular rate and rhythm. Abdomen soft, non-tender, normal bowel sounds.   Impression . . . . . . . The patient is tolerating radiation. Plan . . . . . . . . . . . . Continue treatment as planned.  ________________________________   Blair Promise, PhD, MD  This document serves as a record of services personally performed by Gery Pray, MD. It was created on his behalf by Linward Natal, a trained medical scribe. The creation of this record is based on the scribe's personal observations and the provider's statements to them. This document has been checked and approved by the attending provider.

## 2016-12-23 ENCOUNTER — Other Ambulatory Visit: Payer: Self-pay | Admitting: *Deleted

## 2016-12-23 ENCOUNTER — Ambulatory Visit: Payer: Medicare HMO

## 2016-12-23 ENCOUNTER — Ambulatory Visit
Admission: RE | Admit: 2016-12-23 | Discharge: 2016-12-23 | Disposition: A | Discharge status: Home / Self Care | Payer: Medicare HMO | Source: Ambulatory Visit | Attending: Radiation Oncology | Admitting: Radiation Oncology

## 2016-12-23 ENCOUNTER — Ambulatory Visit: Payer: Medicare HMO | Admitting: Physical Therapy

## 2016-12-23 DIAGNOSIS — C210 Malignant neoplasm of anus, unspecified: Secondary | ICD-10-CM | POA: Diagnosis not present

## 2016-12-23 DIAGNOSIS — Z87891 Personal history of nicotine dependence: Secondary | ICD-10-CM | POA: Diagnosis not present

## 2016-12-23 DIAGNOSIS — C7982 Secondary malignant neoplasm of genital organs: Secondary | ICD-10-CM

## 2016-12-23 MED ORDER — POTASSIUM CHLORIDE CRYS ER 20 MEQ PO TBCR: 20.0000 meq | EXTENDED_RELEASE_TABLET | Freq: Every day | ORAL | 0 refills | Status: DC

## 2016-12-23 MED FILL — oxyCODONE HCL 10 MG TABS: 10 | 20 days supply | Qty: 120 | Fill #0

## 2016-12-23 NOTE — Telephone Encounter (Signed)
Called pt & informed to start Kdur 20 mdq daily due to low K+ per Ned Card NP.  Discussed mild neutropenia & informed to call for any fever or signs or symptoms of infection & will repeat labs when she is here Monday for MD.  Informed to come in early for labs.  Pt expressed understanding.

## 2016-12-26 ENCOUNTER — Ambulatory Visit (HOSPITAL_BASED_OUTPATIENT_CLINIC_OR_DEPARTMENT_OTHER): Payer: Medicare HMO | Admitting: Oncology

## 2016-12-26 ENCOUNTER — Encounter: Payer: Self-pay | Admitting: Physical Therapy

## 2016-12-26 ENCOUNTER — Telehealth: Payer: Self-pay | Admitting: Physical Therapy

## 2016-12-26 ENCOUNTER — Ambulatory Visit: Payer: Medicare HMO

## 2016-12-26 ENCOUNTER — Other Ambulatory Visit (HOSPITAL_BASED_OUTPATIENT_CLINIC_OR_DEPARTMENT_OTHER): Payer: Medicare HMO

## 2016-12-26 ENCOUNTER — Telehealth: Payer: Self-pay | Admitting: *Deleted

## 2016-12-26 VITALS — BP 118/59 | HR 88 | Temp 99.1°F | Resp 18 | Ht 62.5 in | Wt 187.0 lb

## 2016-12-26 DIAGNOSIS — C210 Malignant neoplasm of anus, unspecified: Secondary | ICD-10-CM

## 2016-12-26 DIAGNOSIS — C7982 Secondary malignant neoplasm of genital organs: Secondary | ICD-10-CM

## 2016-12-26 LAB — CBC WITH DIFFERENTIAL/PLATELET
BASO%: 0.3 % (ref 0.0–2.0)
BASOS ABS: 0 10*3/uL (ref 0.0–0.1)
EOS%: 0.3 % (ref 0.0–7.0)
Eosinophils Absolute: 0 10*3/uL (ref 0.0–0.5)
HCT: 31.5 % — ABNORMAL LOW (ref 34.8–46.6)
HGB: 10.4 g/dL — ABNORMAL LOW (ref 11.6–15.9)
LYMPH%: 4.8 % — AB (ref 14.0–49.7)
MCH: 27.2 pg (ref 25.1–34.0)
MCHC: 33 g/dL (ref 31.5–36.0)
MCV: 82.5 fL (ref 79.5–101.0)
MONO#: 0.8 10*3/uL (ref 0.1–0.9)
MONO%: 20.1 % — ABNORMAL HIGH (ref 0.0–14.0)
NEUT#: 2.9 10*3/uL (ref 1.5–6.5)
NEUT%: 74.5 % (ref 38.4–76.8)
NRBC: 0 % (ref 0–0)
PLATELETS: 171 10*3/uL (ref 145–400)
RBC: 3.82 10*6/uL (ref 3.70–5.45)
RDW: 16.9 % — AB (ref 11.2–14.5)
WBC: 3.9 10*3/uL (ref 3.9–10.3)
lymph#: 0.2 10*3/uL — ABNORMAL LOW (ref 0.9–3.3)

## 2016-12-26 LAB — BASIC METABOLIC PANEL
Anion Gap: 12 mEq/L — ABNORMAL HIGH (ref 3–11)
BUN: 15.8 mg/dL (ref 7.0–26.0)
CHLORIDE: 93 meq/L — AB (ref 98–109)
CO2: 27 meq/L (ref 22–29)
CREATININE: 0.8 mg/dL (ref 0.6–1.1)
Calcium: 9.3 mg/dL (ref 8.4–10.4)
EGFR: 82 mL/min/{1.73_m2} — ABNORMAL LOW (ref >90)
Glucose: 129 mg/dl (ref 70–140)
Potassium: 3.3 mEq/L — ABNORMAL LOW (ref 3.5–5.1)
Sodium: 131 mEq/L — ABNORMAL LOW (ref 136–145)

## 2016-12-26 LAB — TECHNOLOGIST REVIEW

## 2016-12-26 NOTE — Telephone Encounter (Signed)
Called patient since she did not show up for 3:15 appointment. Pt states she thought her sister cancelled the appointment. Pt states she is feeling okay. Reminded pt of her upcoming appointment on Wednesday at 2:30. Pt states she will be attending. Allyson Sabal [place], [place] 12/26/16 3:59 PM

## 2016-12-26 NOTE — Telephone Encounter (Signed)
Called pt, she did not pick up the potassium supplement. Instructed her to start 20 meq daily today. She stated she would.

## 2016-12-26 NOTE — Progress Notes (Signed)
  [facility] OFFICE PROGRESS NOTE   Diagnosis: Anal cancer  INTERVAL HISTORY:   Ms. Biggs returns as scheduled. She completed radiation 12/23/2016. She no longer has diarrhea. She complains of abdominal pain, intermittent nausea, and pain related to skin breakdown at the perineum. She takes oxycodone for breakthrough pain and continues a Duragesic patch.  Objective:  Vital signs in last 24 hours:  Blood pressure (!) 118/59, pulse 88, temperature 99.1 F (37.3 C), temperature source Oral, resp. rate 18, height 5' 2.5" (1.588 m), weight 187 lb (84.8 kg), SpO2 99 %.    Lymphatics: No inguinal nodes Resp: Lungs clear bilaterally Cardio: Regular rate and rhythm GI: No hepatosplenomegaly, soft, no mass Vascular: Trace low leg edema bilaterally  Skin: Erythema at the labia and growing, superficial skin breakdown at the perineum and gluteal folds     Lab Results:  Lab Results  Component Value Date   WBC 3.9 12/26/2016   HGB 10.4 (L) 12/26/2016   HCT 31.5 (L) 12/26/2016   MCV 82.5 12/26/2016   PLT 171 12/26/2016   NEUTROABS 2.9 12/26/2016  Potassium 3.3, sodium 131, creatinine 0.8, chloride 93   Medications: I have reviewed the patient's current medications.  Assessment/Plan: 1. Anal cancer ? CT abdomen/pelvis 10/21/2016-thickening of the anus extending to the junction between the anus and rectum with a large stool ball in the rectum and mild fat stranding posterior to the rectum. Enlarged lymph nodes in the right and left inguinal regions. A few mildly prominent nodesseen posterior to the rectum. ? 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 ? PET scan [id number] hypermetabolic anal mass with hypermetabolic metastases to the groin region bilaterally, left pelvic sidewall and presacral space. ? Initiation of radiation and cycle 1 5-FU/mitomycin C 11/14/2016 ? Cycle 2 5-FU/mitomycin C 12/12/2016 (5-FU dose reduced due to mucositis,  diarrhea, skin breakdown) ? Radiation completed 12/23/2016 2. Left labial lesions. Question direct extension from the anal cancer versus metastatic disease from anal cancer versus a separate malignant process. 3. Pain and bleeding secondary to #1 and skin breakdown 4. 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 5. Multiple family members with breast cancer. 6. Hypokalemia-likely secondary to decreased nutritional intake and diarrhea     Disposition:  She has completed the course of chemotherapy and radiation for treatment of anal cancer. Ms. Enriques continues to have significant pain, likely secondary to radiation skin toxicity. She is maintained on a Duragesic patch. I encouraged her to wean the use of oxycodone as tolerated. Hopefully the pain will improve significantly over the next few weeks.  We will confirm she is taking the potassium Supplement. We will check a potassium level when she returns to see radiation oncology next week.  She will return for an office visit in one month.  25 minutes were spent with the patient today. The majority of the time was used for counseling and coordination of care.  Betsy Coder, MD  12/26/2016  3:24 PM

## 2016-12-27 ENCOUNTER — Ambulatory Visit: Payer: Medicare HMO

## 2016-12-27 ENCOUNTER — Telehealth: Payer: Self-pay | Admitting: *Deleted

## 2016-12-27 ENCOUNTER — Emergency Department (HOSPITAL_COMMUNITY)
Admission: EM | Admit: 2016-12-27 | Discharge: 2016-12-27 | Disposition: A | Discharge status: Home / Self Care | Payer: Medicare HMO | Attending: Emergency Medicine | Admitting: Emergency Medicine

## 2016-12-27 ENCOUNTER — Encounter (HOSPITAL_COMMUNITY): Payer: Self-pay

## 2016-12-27 DIAGNOSIS — Z85850 Personal history of malignant neoplasm of thyroid: Secondary | ICD-10-CM | POA: Diagnosis not present

## 2016-12-27 DIAGNOSIS — Z79899 Other long term (current) drug therapy: Secondary | ICD-10-CM | POA: Diagnosis not present

## 2016-12-27 DIAGNOSIS — Z87891 Personal history of nicotine dependence: Secondary | ICD-10-CM | POA: Insufficient documentation

## 2016-12-27 DIAGNOSIS — Z85048 Personal history of other malignant neoplasm of rectum, rectosigmoid junction, and anus: Secondary | ICD-10-CM | POA: Diagnosis not present

## 2016-12-27 DIAGNOSIS — R112 Nausea with vomiting, unspecified: Secondary | ICD-10-CM | POA: Insufficient documentation

## 2016-12-27 DIAGNOSIS — R110 Nausea: Secondary | ICD-10-CM

## 2016-12-27 HISTORY — DX: Malignant neoplasm of rectum: C20

## 2016-12-27 HISTORY — DX: Malignant neoplasm of thyroid gland: C73

## 2016-12-27 LAB — CBC
HCT: 31.1 % — ABNORMAL LOW (ref 36.0–46.0)
HEMOGLOBIN: 10.3 g/dL — AB (ref 12.0–15.0)
MCH: 27.2 pg (ref 26.0–34.0)
MCHC: 33.1 g/dL (ref 30.0–36.0)
MCV: 82.3 fL (ref 78.0–100.0)
Platelets: 158 10*3/uL (ref 150–400)
RBC: 3.78 MIL/uL — AB (ref 3.87–5.11)
RDW: 16.9 % — ABNORMAL HIGH (ref 11.5–15.5)
WBC: 4.3 10*3/uL (ref 4.0–10.5)

## 2016-12-27 LAB — COMPREHENSIVE METABOLIC PANEL
ALT: 43 U/L (ref 14–54)
ANION GAP: 10 (ref 5–15)
AST: 57 U/L — ABNORMAL HIGH (ref 15–41)
Albumin: 2.7 g/dL — ABNORMAL LOW (ref 3.5–5.0)
Alkaline Phosphatase: 63 U/L (ref 38–126)
BUN: 20 mg/dL (ref 6–20)
CHLORIDE: 94 mmol/L — AB (ref 101–111)
CO2: 28 mmol/L (ref 22–32)
Calcium: 8.7 mg/dL — ABNORMAL LOW (ref 8.9–10.3)
Creatinine, Ser: 0.68 mg/dL (ref 0.44–1.00)
GFR calc Af Amer: >60 mL/min (ref >60)
Glucose, Bld: 110 mg/dL — ABNORMAL HIGH (ref 65–99)
Potassium: 3 mmol/L — ABNORMAL LOW (ref 3.5–5.1)
SODIUM: 132 mmol/L — AB (ref 135–145)
Total Bilirubin: 0.9 mg/dL (ref 0.3–1.2)
Total Protein: 6.5 g/dL (ref 6.5–8.1)

## 2016-12-27 LAB — LIPASE, BLOOD: LIPASE: 13 U/L (ref 11–51)

## 2016-12-27 MED ORDER — LOPERAMIDE HCL 2 MG PO CAPS: 2.0000 mg | ORAL_CAPSULE | Freq: Four times a day (QID) | ORAL | 0 refills | Status: DC | PRN

## 2016-12-27 MED ORDER — SODIUM CHLORIDE 0.9 % IV BOLUS (SEPSIS)
1000.0000 mL | Freq: Once | INTRAVENOUS | Status: AC
  Administered 2016-12-27: 1000 mL via INTRAVENOUS

## 2016-12-27 MED ORDER — ONDANSETRON 4 MG PO TBDP
4.0000 mg | ORAL_TABLET | Freq: Once | ORAL | Status: AC | PRN
  Administered 2016-12-27: 4 mg via ORAL
  Filled 2016-12-27: qty 1

## 2016-12-27 MED ORDER — ONDANSETRON HCL 4 MG PO TABS: 4.0000 mg | ORAL_TABLET | Freq: Three times a day (TID) | ORAL | 0 refills | Status: DC | PRN

## 2016-12-27 MED ORDER — LIDOCAINE HCL 2 % EX GEL
1.0000 "application " | Freq: Once | CUTANEOUS | Status: AC
  Administered 2016-12-27: 1 via TOPICAL
  Filled 2016-12-27: qty 11

## 2016-12-27 MED ORDER — POTASSIUM CHLORIDE CRYS ER 20 MEQ PO TBCR
40.0000 meq | EXTENDED_RELEASE_TABLET | Freq: Once | ORAL | Status: AC
  Administered 2016-12-27: 40 meq via ORAL
  Filled 2016-12-27: qty 2

## 2016-12-27 MED ORDER — POTASSIUM CHLORIDE CRYS ER 20 MEQ PO TBCR
EXTENDED_RELEASE_TABLET | ORAL | Status: AC
  Filled 2016-12-27: qty 1

## 2016-12-27 MED ORDER — LIDOCAINE 2 % EX GEL: 1.0000 "application " | CUTANEOUS | 0 refills | Status: DC | PRN

## 2016-12-27 MED ORDER — ONDANSETRON HCL 4 MG/2ML IJ SOLN
4.0000 mg | Freq: Once | INTRAMUSCULAR | Status: AC
  Administered 2016-12-27: 4 mg via INTRAVENOUS
  Filled 2016-12-27: qty 2

## 2016-12-27 MED ORDER — FENTANYL CITRATE (PF) 100 MCG/2ML IJ SOLN
50.0000 ug | Freq: Once | INTRAMUSCULAR | Status: AC
  Administered 2016-12-27: 50 ug via INTRAVENOUS
  Filled 2016-12-27: qty 2

## 2016-12-27 NOTE — ED Triage Notes (Signed)
PT RECEIVED FROM HOME VIA EMS C/O CHRONIC ABDOMINAL PAIN SINCE Friday WITH N/V. PER EMS, PT HAS RECTAL CA, HER LAST TX OF CHEMO/RAD WAS Friday. PT UNABLE TO SIT DUE TO THE PAIN. PT HAS HAD NORMAL BM SINCE Friday. DENIES FEVER.

## 2016-12-27 NOTE — ED Provider Notes (Signed)
[facility] DEPT Provider Note   CSN: [id number] Arrival date & time: 12/27/16  1336     History   Chief Complaint Chief Complaint  Patient presents with  . Abdominal Pain  . Nausea    HPI Ashley Moore is a 66 y.o. female.  Patient, who is s/p radiation therapy for anal cancer 4 days ago, presents with worsening skin irritation in anal and vaginal area that began after therapy. She has also been having nausea, vomiting, diarrhea and burning with urination. States silvadene cream helps with irritation but has to apply it numerous times a day. Any contact of the area with urine or stool increases the pain. States the area is "raw" and keeping her from being able to use the bathroom. States does not take anything for nausea.  Denies blood in stool, hematuria, chest pain, trouble breathing, leg swelling, hematemesis, appetite changes, bleeding or fever.      Past Medical History:  Diagnosis Date  . Bowen's disease    excised 1992  . Headache   . Hx of varicella   . 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([place])   . Thyroid cancer [facility])     Patient Active Problem List   Diagnosis Date Noted  . Port catheter in place 11/21/2016  . 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 Anal cancer ([place]) 11/03/2016  . Cystitis 05/06/2015  . Degenerative cervical disc 04/08/2015  . Trigger point of neck 03/26/2015  . Pain in joint, ankle and foot 04/23/2013  . Visit for preventive health examination 02/13/2013  . Routine gynecological examination 02/13/2013  . Family history of breast cancer in first degree relative 02/13/2013  . Rash and nonspecific skin eruption 02/13/2013  . 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 VITAMIN D DEFICIENCY 06/25/2008  . NECK PAIN 06/02/2008  . FOOT PAIN 06/02/2008  . OSTEOPENIA 06/02/2008    Past Surgical History:  Procedure Laterality Date  . BUNIONECTOMY Right   . IR GENERIC HISTORICAL  11/14/2016   IR US GUIDE VASC ACCESS RIGHT 11/14/2016 [facility]  .  IR GENERIC HISTORICAL  11/14/2016   IR FLUORO GUIDE CV LINE RIGHT 11/14/2016 [facility]  . IR GENERIC HISTORICAL  12/12/2016   IR US GUIDE VASC ACCESS RIGHT 12/12/2016 [name], MD [facility]  . IR GENERIC HISTORICAL  12/12/2016   IR FLUORO GUIDE CV LINE RIGHT 12/12/2016 [name], MD [facility]  . SKIN CANCER EXCISION     bowens disease    OB History    No data available       Home Medications    Prior to Admission medications   Medication Sig Start Date End Date Taking? Authorizing Provider  fentaNYL (DURAGESIC - DOSED MCG/HR) 50 MCG/HR Place 50 mcg onto the skin every 3 (three) days. Take 1 patch and apply patch to skin and change every 72 hours 12/21/16  Yes Hayden Pedro, PA-C  gabapentin (NEURONTIN) 100 MG capsule TAKE 1 CAPSULE THREE TIMES DAILY 11/25/16  Yes Pieter Partridge, DO  LORazepam (ATIVAN) 0.5 MG tablet Take 0.5 mg by mouth every 4 (four) hours as needed for anxiety. Take 1 tab oral every 4-6 hours prn for anxiety or nausea 12/21/16  Yes Hayden Pedro, PA-C  Oxycodone HCl 10 MG TABS Take 1 tablet (10 mg total) by mouth every 4 (four) hours as needed. 12/22/16  Yes Hayden Pedro, PA-C  prochlorperazine (COMPAZINE) 10 MG tablet Take 1 tablet (10 mg total) by mouth every 6 (six) hours as needed  for nausea or vomiting. 12/16/16  Yes Kyung Rudd, MD  silver sulfADIAZINE (SILVADENE) 1 % cream Apply 1 application topically 2 (two) times daily. Apply to areas of skin affected, mush wash off before re-applying 12/21/16  Yes Historical Provider, MD  Lidocaine 2 % GEL Apply 1 application topically as needed (for pain). 12/27/16   Deneka Greenwalt, PA-C  loperamide (IMODIUM) 2 MG capsule Take 1 capsule (2 mg total) by mouth 4 (four) times daily as needed for diarrhea or loose stools. 12/27/16   Sharonica Kraszewski, PA-C  meloxicam (MOBIC) 15 MG tablet TAKE 1 TABLET EVERY DAY Patient not taking: Reported on 12/27/2016 12/19/16   Pieter Partridge, DO  ondansetron (ZOFRAN) 4 MG tablet Take 1  tablet (4 mg total) by mouth every 8 (eight) hours as needed for nausea or vomiting. 12/27/16   Piedad Standiford, PA-C  phenazopyridine (PYRIDIUM) 200 MG tablet Take 1 tablet (200 mg total) by mouth 3 (three) times daily as needed for pain. Patient not taking: Reported on 12/27/2016 12/16/16   Kyung Rudd, MD  potassium chloride SA (K-DUR,KLOR-CON) 20 MEQ tablet Take 1 tablet (20 mEq total) by mouth daily. 12/23/16   [name], NP  tiZANidine (ZANAFLEX) 4 MG tablet TAKE 1 TABLET EVERY 8 HOURS AS NEEDED FOR MUSCLE SPASM(S) 10/07/16   Pieter Partridge, DO    Family History Family History  Problem Relation Age of Onset  . Lung cancer Mother     lung  . Pancreatitis Father   . Breast cancer Sister   . Breast cancer Maternal Grandmother     breast   . Breast cancer Maternal Aunt     breast  . Melanoma Sister     Social History Social History  Substance Use Topics  . Smoking status: Former Research scientist (life sciences)  . Smokeless tobacco: Former Systems developer    Quit date: 09/20/1995  . Alcohol use 0.0 oz/week     Comment: occasional     Allergies   Dilaudid [hydromorphone hcl]; Benadryl [diphenhydramine]; Melatonin; and Penicillins   Review of Systems Review of Systems  Constitutional: Positive for chills. Negative for appetite change and fever.  HENT: Negative for ear pain, rhinorrhea, sneezing and sore throat.   Eyes: Negative for photophobia and visual disturbance.  Respiratory: Negative for cough, chest tightness, shortness of breath and wheezing.   Cardiovascular: Negative for chest pain and palpitations.  Gastrointestinal: Positive for abdominal pain, diarrhea, nausea, rectal pain and vomiting. Negative for blood in stool and constipation.  Genitourinary: Positive for dysuria. Negative for hematuria, urgency, vaginal bleeding and vaginal pain.  Musculoskeletal: Negative for myalgias.  Skin: Positive for rash.  Neurological: Negative for dizziness, weakness and light-headedness.     Physical Exam Updated  Vital Signs BP 135/72   Pulse 95   Temp 98.1 F (36.7 C)   Resp 16   Ht 5\' 2"  (1.575 m)   Wt 85.3 kg   SpO2 100%   BMI 34.39 kg/m   Physical Exam  Constitutional: She appears well-developed and well-nourished. No distress.  HENT:  Head: Normocephalic and atraumatic.  Nose: Nose normal.  Eyes: Conjunctivae and EOM are normal. Left eye exhibits no discharge. No scleral icterus.  Neck: Normal range of motion. Neck supple.  Cardiovascular: Normal rate, regular rhythm, normal heart sounds and intact distal pulses.  Exam reveals no gallop and no friction rub.   No murmur heard. Pulmonary/Chest: Effort normal and breath sounds normal. No respiratory distress.  Abdominal: Soft. Bowel sounds are normal. She exhibits no  distension. There is no tenderness. There is no guarding.  Genitourinary: There is rash and lesion on the right labia. There is rash and lesion on the left labia.  Musculoskeletal: Normal range of motion. She exhibits no edema.  Neurological: She is alert. She exhibits normal muscle tone. Coordination normal.  Skin: Skin is warm and dry. Rash noted. She is not diaphoretic. There is erythema.  Erythema of bilateral labia and gluteal folds. Area appears very irritated. Silvadene cream has been applied. There is some skin breakdown.There is no active bleeding. Fecal matter noted.  Psychiatric: She has a normal mood and affect.  Nursing note and vitals reviewed.    ED Treatments / Results  Labs (all labs ordered are listed, but only abnormal results are displayed) Labs Reviewed  COMPREHENSIVE METABOLIC PANEL - Abnormal; Notable for the following:       Result Value   Sodium 132 (*)    Potassium 3.0 (*)    Chloride 94 (*)    Glucose, Bld 110 (*)    Calcium 8.7 (*)    Albumin 2.7 (*)    AST 57 (*)    All other components within normal limits  CBC - Abnormal; Notable for the following:    RBC 3.78 (*)    Hemoglobin 10.3 (*)    HCT 31.1 (*)    RDW 16.9 (*)    All  other components within normal limits  LIPASE, BLOOD    EKG  EKG Interpretation None       Radiology No results found.  Procedures Procedures (including critical care time)  Medications Ordered in ED Medications  ondansetron (ZOFRAN-ODT) disintegrating tablet 4 mg (4 mg Oral Given 12/27/16 1403)  potassium chloride SA (K-DUR,KLOR-CON) CR tablet 40 mEq (40 mEq Oral Given 12/27/16 2127)  lidocaine (XYLOCAINE) 2 % jelly 1 application (1 application Topical Given 12/27/16 1750)  ondansetron (ZOFRAN) injection 4 mg (4 mg Intravenous Given 12/27/16 1749)  fentaNYL (SUBLIMAZE) injection 50 mcg (50 mcg Intravenous Given 12/27/16 1749)  sodium chloride 0.9 % bolus 1,000 mL (0 mLs Intravenous Stopped 12/27/16 2103)  lidocaine (XYLOCAINE) 2 % jelly 1 application (1 application Topical Given 12/27/16 2103)     Initial Impression / Assessment and Plan / ED Course  I have reviewed the triage vital signs and the nursing notes.  Pertinent labs & imaging results that were available during my care of the patient were reviewed by me and considered in my medical decision making (see chart for details).     Patient's history and symptoms concerning for dermatitis due to radiation exposure of the labial and anal areas vs. UTI. Patient is s/p radiation therapy 4 days ago for anal cancer. She has been experiencing these symptoms since then. This is likely due to the radiation exposure. Potassium level 3.0 (Dropped from 3.3 yesterday as patient was unable to get K supplement rx filled). Repleted this orally here in ED. Patient states the pain is relieved with silvadene cream but returns as soon as it wears off. She denies dysuria but states there is only pain when the urine comes in contact with the skin. She has been experiencing diarrhea since then, and having the same painful sensation due to the fecal matter as well. Her symptoms improved with the lidocaine jelly and Fentanyl administration. Passed PO  challenge.  She was not given anything for nausea or diarrhea from oncologist, so will d/c her with Zofran, Imodium and lidocaine jelly to apply at home. Also advised to use an  ice pack as needed and to follow up with PCP or oncologist for further evaluation.  Final Clinical Impressions(s) / ED Diagnoses   Final diagnoses:  Nausea    New Prescriptions Discharge Medication List as of 12/27/2016  8:42 PM    START taking these medications   Details  Lidocaine 2 % GEL Apply 1 application topically as needed (for pain)., Starting Tue 12/27/2016, Print         Roselin Wiemann [place], PA-C 12/28/16 0020    Blanchie Dessert, MD 12/28/16 [phone]

## 2016-12-27 NOTE — Discharge Instructions (Signed)
Apply lidocaine jelly as needed for discomfort. Continue silvadene cream. Apply ice pack as needed. Take Zofran as needed for nausea. Follow up with oncologist and PCP as needed for further evaluation. Return to ED for worsening symptoms, blood in stool, trouble urinating, numbness, weakness or vomiting despite Zofran use.

## 2016-12-27 NOTE — ED Notes (Signed)
Pt request for urine specimen vital signs and to be cleaned up after pain medication.

## 2016-12-27 NOTE — Telephone Encounter (Signed)
Message from pt's family member requesting call back. "She is really sick." Returned call, pt is in ED being evaluated. Per family member she hasn't been able to keep anything down. Will make Dr. Benay Spice aware.

## 2016-12-27 NOTE — ED Notes (Signed)
Still holding K+ until pt's nausea fully passes.

## 2016-12-27 NOTE — ED Notes (Addendum)
Pt asked to have perianal area cleaned before d/c.  When I began cleaning area pt began screaming and asking for more lidocaine jelly.  Pt's family member mumbled under her breath saying "this hospital- at my hospital they bring you your medications before you're discharged" and "is there no lidocaine in the entire hospital?".  I told them both that I would get more lidocaine jelly after the cleaning.  Cleaned pt as much as she would allow and applied lidocaine to affected perianal area.  Placed pt in clean pants and a large pad provided from home as the diaper per pt request.  Pt's family member appeared content at their discharge.

## 2016-12-28 ENCOUNTER — Ambulatory Visit: Payer: Medicare HMO

## 2016-12-29 ENCOUNTER — Telehealth: Payer: Self-pay | Admitting: Oncology

## 2016-12-29 NOTE — Telephone Encounter (Signed)
sw pt to confirm 4/30 appt date/time per LOS

## 2016-12-30 ENCOUNTER — Other Ambulatory Visit: Payer: Self-pay | Admitting: Radiation Oncology

## 2016-12-30 ENCOUNTER — Telehealth: Payer: Self-pay | Admitting: *Deleted

## 2016-12-30 ENCOUNTER — Ambulatory Visit: Payer: Medicare HMO | Admitting: Physical Therapy

## 2016-12-30 DIAGNOSIS — I890 Lymphedema, not elsewhere classified: Secondary | ICD-10-CM | POA: Diagnosis not present

## 2016-12-30 MED ORDER — SILVER SULFADIAZINE 1 % EX CREA: 1.0000 "application " | TOPICAL_CREAM | Freq: Two times a day (BID) | CUTANEOUS | 0 refills | Status: DC

## 2016-12-30 NOTE — Therapy (Signed)
Ashley Moore, [place], [address] Phone: [phone]   Fax:  [phone]  Physical Therapy Treatment  Patient Details  Name: Ashley Moore MRN: [id number] Date of Birth: 1951/07/01 Referring Provider: Shona Simpson, NP  Encounter Date: 12/30/2016      PT End of Session - 12/30/16 1233    Visit Number 4   Number of Visits 12   Date for PT Re-Evaluation 01/12/17   PT Start Time 3875   PT Stop Time 1200   PT Time Calculation (min) 105 min   Activity Tolerance Patient limited by pain;Patient tolerated treatment well   Behavior During Therapy [facility] for tasks assessed/performed      Past Medical History:  Diagnosis Date  . Bowen's disease    excised 1992  . Headache   . Hx of varicella   . 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([place])   . Thyroid cancer [facility])     Past Surgical History:  Procedure Laterality Date  . BUNIONECTOMY Right   . IR GENERIC HISTORICAL  11/14/2016   IR US GUIDE VASC ACCESS RIGHT 11/14/2016 [facility]  . IR GENERIC HISTORICAL  11/14/2016   IR FLUORO GUIDE CV LINE RIGHT 11/14/2016 [facility]  . IR GENERIC HISTORICAL  12/12/2016   IR US GUIDE VASC ACCESS RIGHT 12/12/2016 [name], MD [facility]  . IR GENERIC HISTORICAL  12/12/2016   IR FLUORO GUIDE CV LINE RIGHT 12/12/2016 [name], MD [facility]  . SKIN CANCER EXCISION     bowens disease    There were no vitals filed for this visit.      Subjective Assessment - 12/30/16 1222    Subjective Pt was in the ER this week due to nausea and has been recuperating at home.  She has not been bandaged all week.  Her sister is leaving on Sunday and she will not have assistance at home. She is interested in getting the CircAid reduction kit as a bandaging alternative to more easily manage her swelling at home.    Patient is accompained by: Family member   Pertinent History Diagnosed with anal cancer 6 weeks ago but reports being misdiagnosed by 3  doctors over the past year as having hemorrhoids. They began chemo and radiation simultaneously about a month ago. 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   Patient Stated Goals Reduce leg swelling   Currently in Pain? Yes   Pain Score 6    Pain Location Perineum   Pain Descriptors / Indicators Sharp;Shooting   Pain Type Acute pain   Pain Onset 1 to 4 weeks ago   Pain Frequency Intermittent                         [facility] Adult PT Treatment/Exercise - 12/30/16 0001      Self-Care   Self-Care Other Self-Care Comments   Other Self-Care Comments  educated pt about circaid reduction kits for leg      Exercises   Exercises --  began instruction in AROM of legs, but pt is limited by pain     Manual Therapy   Manual Therapy Manual Lymphatic Drainage (MLD);Compression Bandaging   Manual Lymphatic Drainage (MLD) In Supine: Short neck, 5 diaphragmatic breaths, Rt axillary nodes, rt inguino-axillary anastomosis, then Rt LE from lateral hip to dorsal foot working proximally to distal then retracing all steps; then same on Lt side.    Compression Bandaging Compression bandaging to Bil LE in  supine with legs elevated: Biotone lotion, thick stockinette, elastomull on first 3 toes, Artiflex on lower legs and feet with 1/2" gray foam x2, one at lateral and medial Lt malleouli; Comprilan compression bandages from feet to knees trying to wrap Lt foot a little looser as pt had pain from first time being wrapped.                         Long Term Clinic Goals - 12/15/16 1209      CC Long Term Goal  #1   Title Patient will verbalize understanding of lymphedema including treatment and maintenance.   Time 4   Period Weeks   Status New     CC Long Term Goal  #2   Title Reduce bilateral legs to </= 22 cm at 5 cm proximal to 5th MTP.   Time 4   Period Weeks   Status New     CC Long Term Goal  #3   Title Reduce bilateral legs to </= 35 cm at 20 cm proximal to  floor at lateral left leg.   Time 4   Period Weeks   Status New     CC Long Term Goal  #4   Title Patient will verbalize where and how to be fitted for compression garments for BLE.   Time 4   Period Weeks   Status New            Plan - 12/30/16 1233    Clinical Impression Statement Pt continues with pitting edema especailly in feet and ankles, and also into lower legs toward knees and limited mobility due to severe pain from cancer and radiation.  She has extreme pain when moving off of table after treatment and needed prolonged time in the bathroom. she is able to walk with a wide base and straight cane.  She will get thet reduction kits next week and then call for visit here for them to be adjusted as she contiues to reduce.  Anticipate she will be able to wear them longer than she was able to keep bandages on and so should see better reduction    Rehab Potential Good   Clinical Impairments Affecting Rehab Potential Extent of disease   PT Next Visit Plan remeasure check to see effect of circaid reduction kit. continue with MLD  teach remedial exercise for lymphedema, see if pt wants to pursue a compression pump   Consulted and Agree with Plan of Care Patient   Family Member Consulted sister      Patient will benefit from skilled therapeutic intervention in order to improve the following deficits and impairments:  Increased edema, Pain, Decreased knowledge of precautions, Decreased knowledge of use of DME, Difficulty walking, Postural dysfunction  Visit Diagnosis: Lymphedema, not elsewhere classified     Problem List Patient Active Problem List   Diagnosis Date Noted  . Port catheter in place 11/21/2016  . 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 Anal cancer ([place]) 11/03/2016  . Cystitis 05/06/2015  . Degenerative cervical disc 04/08/2015  . Trigger point of neck 03/26/2015  . Pain in joint, ankle and foot 04/23/2013  . Visit for preventive health  examination 02/13/2013  . Routine gynecological examination 02/13/2013  . Family history of breast cancer in first degree relative 02/13/2013  . Rash and nonspecific skin eruption 02/13/2013  . 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 VITAMIN D DEFICIENCY 06/25/2008  . NECK PAIN 06/02/2008  . FOOT  PAIN 06/02/2008  . OSTEOPENIA 06/02/2008   Donato Heinz. [name] PT  Norwood Levo 12/30/2016, 12:45 PM  [place], [place], [address] Phone: [phone]   Fax:  [phone]  Name: JANAKI EXLEY MRN: [id number] Date of Birth: Feb 22, 1951

## 2016-12-30 NOTE — Patient Instructions (Signed)
First of all, check with your insurance company to see if provider is in network   [facility]                                            [address]  [place], [place] [address] [phone]    Does not file for insurance--- call for appointment with Cathy  [facility]   (for wigs and compression sleeves / gloves/gauntlets )  [address] [place], [place] [address] [phone]  Will file some insurances --- call for appointment   [facility] (for mastectomy prosthetics and garments) [address] [place], [place] [address] [phone] Will file some insurances --- call for appointment  [facility]  [address]  [place], [place] [address] [phone] Lower extremity garments  [facility] [address] [place], [place]  [address] [phone]  [employer] Sales rep:  Matt Lawson:  [phone] [email or web address] Biocompression pumps   [facility]  Sales rep: Robert Rollins:  [phone] [email or web address] Entre and Flexitouch pumps    Other Resources: National Lymphedema Network:  www.lymphnet.org www.[email or web address].com for patient articles and purchase a self manual lymph drainage DVD [email or web address] has informative articles.  

## 2016-12-30 NOTE — Telephone Encounter (Signed)
Patient called requesting a refill for her Silvadene cream sent to [facility] ([address]) on file.

## 2017-01-03 ENCOUNTER — Other Ambulatory Visit: Payer: Self-pay | Admitting: Neurology

## 2017-01-03 ENCOUNTER — Other Ambulatory Visit: Payer: Self-pay | Admitting: *Deleted

## 2017-01-03 MED ORDER — POTASSIUM CHLORIDE CRYS ER 20 MEQ PO TBCR: 20.0000 meq | EXTENDED_RELEASE_TABLET | Freq: Every day | ORAL | 0 refills | Status: DC

## 2017-01-04 NOTE — Progress Notes (Addendum)
Ashley Moore 66 y.o. woman with Anal cancer radiation completed 12-23-16, two week FU.  Pain:  3/10 anal perirectal area                                 Using Oxycodone for pain control Nausea/ Vomiting:Drinking coke to help settle her stomach. Diarrhea:Nonw Bladder issues:Denies urinary frequency or dysuria over the past four days. Vaginal/Rectal bleeding:Having a small amount of blood on her adult brief, not everyday when she sits a lot. Skin irritation: Reports still having a lot of blisters to bilateral groin and buttocks       Using silvadene to bilateral groin and buttock moist desquamation. 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  When she gets up to void most of the time she is not going back to sleep. Loss of appetite:Reports a good appetite. Reports her legs are less swollen today but her feet are still swollen, doing some leg exercises each day. Weight: Wt Readings from Last 3 Encounters:  01/05/17 188 lb (85.3 kg)  12/27/16 188 lb (85.3 kg)  12/26/16 187 lb (84.8 kg)  1600 Oxycodone 10 mg given po for pain 3/10 for perirectal pain per Shona Simpson, P.A. BP 117/68   Pulse 92   Temp 98.2 F (36.8 C) (Oral)   Resp 18   Ht 5\' 2"  (1.575 m)   Wt 188 lb (85.3 kg)   SpO2 100%   BMI 34.39 kg/m

## 2017-01-05 ENCOUNTER — Encounter: Payer: Self-pay | Admitting: Radiation Oncology

## 2017-01-05 ENCOUNTER — Ambulatory Visit
Admission: RE | Admit: 2017-01-05 | Discharge: 2017-01-05 | Disposition: A | Discharge status: Home / Self Care | Payer: Medicare HMO | Source: Ambulatory Visit | Attending: Radiation Oncology | Admitting: Radiation Oncology

## 2017-01-05 VITALS — BP 117/68 | HR 92 | Temp 98.2°F | Resp 18 | Ht 62.0 in | Wt 188.0 lb

## 2017-01-05 DIAGNOSIS — C210 Malignant neoplasm of anus, unspecified: Secondary | ICD-10-CM

## 2017-01-05 DIAGNOSIS — Z87891 Personal history of nicotine dependence: Secondary | ICD-10-CM | POA: Diagnosis not present

## 2017-01-05 MED ORDER — OXYCODONE-ACETAMINOPHEN 5-325 MG PO TABS
2.0000 | ORAL_TABLET | Freq: Once | ORAL | Status: AC
  Administered 2017-01-05: 2 via ORAL
  Filled 2017-01-05: qty 2

## 2017-01-05 MED ORDER — OXYCODONE HCL 10 MG PO TABS: 10.0000 mg | ORAL_TABLET | ORAL | 0 refills | Status: DC | PRN

## 2017-01-05 MED ORDER — OXYCODONE-ACETAMINOPHEN 5-325 MG PO TABS: 2.0000 | ORAL_TABLET | Freq: Once | ORAL | 0 refills | Status: DC

## 2017-01-05 NOTE — Progress Notes (Signed)
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  Interval Since Last Radiation:  2 weeks   11/14/16-12/23/16: 54 Gy to the pelvic nodes, anus, and bilateral groins.  Narrative:  The patient returns today for routine follow-up. During the course of treatment she developed significant desquamation and pain as a result of her treatment requiring narcotic pain medication.                      On review of systems, the patient states she's doing better with pain management since her last visit. She has episodes however or panic attacks in the last few weeks and reports that she feels that these come on when she's feeling especially if she's emotionally stressed, or if she's worried about physically falling. 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 She reports she's had less anal pain and reports that she's stopped her fentanyl patch. She continues to use Oxycodone every 4-5 hours as needed, and feels that this still helps. She requests a refill of this. She denies any rectal bleeding or discharge, and is able to have more formed stools at this time. No other complaints are verbalized.  ALLERGIES:  is allergic to dilaudid [hydromorphone hcl]; benadryl [diphenhydramine]; melatonin; and penicillins.  Meds: Current Outpatient Prescriptions  Medication Sig Dispense Refill  . gabapentin (NEURONTIN) 100 MG capsule TAKE 1 CAPSULE THREE TIMES DAILY 270 capsule 3  . Oxycodone HCl 10 MG TABS Take 1 tablet (10 mg total) by mouth every 4 (four) hours as needed. 120 tablet 0  . potassium chloride SA (K-DUR,KLOR-CON) 20 MEQ tablet Take 1 tablet (20 mEq total) by mouth daily. 30 tablet 0  .  prochlorperazine (COMPAZINE) 10 MG tablet Take 1 tablet (10 mg total) by mouth every 6 (six) hours as needed for nausea or vomiting. 40 tablet 0  . silver sulfADIAZINE (SILVADENE) 1 % cream Apply 1 application topically 2 (two) times daily. Apply to areas of skin affected, mush wash off before re-applying    . silver sulfADIAZINE (SILVADENE) 1 % cream Apply 1 application topically 2 (two) times daily. 400 g 0  . loperamide (IMODIUM) 2 MG capsule Take 1 capsule (2 mg total) by mouth 4 (four) times daily as needed for diarrhea or loose stools. (Patient not taking: Reported on 01/05/2017) 12 capsule 0  . LORazepam (ATIVAN) 0.5 MG tablet Take 0.5 mg by mouth every 4 (four) hours as needed for anxiety. Take 1 tab oral every 4-6 hours prn for anxiety or nausea    . meloxicam (MOBIC) 15 MG tablet TAKE 1 TABLET EVERY DAY (Patient not taking: Reported on 12/27/2016) 90 tablet 0  . ondansetron (ZOFRAN) 4 MG tablet Take 1 tablet (4 mg total) by mouth every 8 (eight) hours as needed for nausea or vomiting. (Patient not taking: Reported on 01/05/2017) 20 tablet 0  . phenazopyridine (PYRIDIUM) 200 MG tablet Take 1 tablet (200 mg total) by mouth 3 (three) times daily as needed for pain. (Patient not taking: Reported on 12/27/2016) 40 tablet 1  . tiZANidine (ZANAFLEX) 4 MG tablet TAKE 1 TABLET EVERY 8 HOURS AS NEEDED FOR MUSCLE SPASM(S) (Patient not taking:   Reported on 01/05/2017) 90 tablet 5   No current facility-administered medications for this encounter.     Physical Findings:  height is 5' 2" (1.575 m) and weight is 188 lb (85.3 kg). Her oral temperature is 98.2 F (36.8 C). Her blood pressure is 117/68 and her pulse is 92. Her respiration is 18 and oxygen saturation is 100%.  Pain Assessment Pain Score: 3 /10 In general this is a well appearing caucasian female in no acute distress. She's alert and oriented x4 and appropriate throughout the examination. Cardiopulmonary assessment is negative for acute distress  and she exhibits normal effort. The perivulvar tissue is intact without evidence of superinfection, but there is still disruption of the epidermis with desquamation. 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 There is wet desquamation of the anus also without superinfection. Edema is noted of bilateral lower extremities though improved, this is 2+ bilaterally.  Lab Findings: Lab Results  Component Value Date   WBC 4.3 12/27/2016   HGB 10.3 (L) 12/27/2016   HCT 31.1 (L) 12/27/2016   MCV 82.3 12/27/2016   PLT 158 12/27/2016     Radiographic Findings: Ir Fluoro Guide Cv Line Right  Result Date: 12/12/2016 INDICATION: Poor venous access. History of anal cancer. In need of intravenous access for short course chemotherapy administration. EXAM: ULTRASOUND AND FLUOROSCOPIC GUIDED PICC LINE INSERTION MEDICATIONS: None. CONTRAST:  None FLUOROSCOPY TIME:  6 seconds (1.1 MGy) COMPLICATIONS: None immediate. TECHNIQUE: The procedure, risks, benefits, and alternatives were explained to the patient and informed written consent was obtained. A timeout was performed prior to the initiation of the procedure. The right upper extremity was prepped with chlorhexidine in a sterile fashion, and a sterile drape was applied covering the operative field. Maximum barrier sterile technique with sterile gowns and gloves were used for the procedure. A timeout was performed prior to the initiation of the procedure. Local anesthesia was provided with 1% lidocaine. Under direct ultrasound guidance, the right brachial vein was accessed with a micropuncture kit after the overlying soft tissues were anesthetized with 1% lidocaine. An ultrasound image was saved for documentation purposes. A guidewire was advanced to the level of the superior caval-atrial junction for measurement purposes and the PICC line was cut to length. A peel-away sheath was placed and a 40 cm, 5 French, single lumen was inserted to level of the  superior caval-atrial junction. A post procedure spot fluoroscopic was obtained. The catheter easily aspirated and flushed and was sutured in place. A dressing was placed. The patient tolerated the procedure well without immediate post procedural complication. FINDINGS: After catheter placement, the tip lies within the superior cavoatrial junction. The catheter aspirates and flushes normally and is ready for immediate use. IMPRESSION: Successful ultrasound and fluoroscopic guided placement of a right brachial vein approach, 40 cm, 5 French, single lumen PICC with tip at the superior caval-atrial junction. The PICC line is ready for immediate use. Electronically Signed   By: John  Watts M.D.   On: 12/12/2016 11:57   Ir Us Guide Vasc Access Right  Result Date: 12/12/2016 INDICATION: Poor venous access. History of anal cancer. In need of intravenous access for short course chemotherapy administration. EXAM: ULTRASOUND AND FLUOROSCOPIC GUIDED PICC LINE INSERTION MEDICATIONS: None. CONTRAST:  None FLUOROSCOPY TIME:  6 seconds (1.1 MGy) COMPLICATIONS: None immediate. TECHNIQUE: The procedure, risks, benefits, and alternatives were explained to the patient and informed written consent was obtained. A timeout was performed prior to the initiation of the procedure. The   right upper extremity was prepped with chlorhexidine in a sterile fashion, and a sterile drape was applied covering the operative field. Maximum barrier sterile technique with sterile gowns and gloves were used for the procedure. A timeout was performed prior to the initiation of the procedure. Local anesthesia was provided with 1% lidocaine. Under direct ultrasound guidance, the right brachial vein was accessed with a micropuncture kit after the overlying soft tissues were anesthetized with 1% lidocaine. An ultrasound image was saved for documentation purposes. A guidewire was advanced to the level of the superior caval-atrial junction for measurement  purposes and the PICC line was cut to length. A peel-away sheath was placed and a 40 cm, 5 [place], single lumen was inserted to level of the superior caval-atrial junction. A post procedure spot fluoroscopic was obtained. The catheter easily aspirated and flushed and was sutured in place. A dressing was placed. The patient tolerated the procedure well without immediate post procedural complication. FINDINGS: After catheter placement, the tip lies within the superior cavoatrial junction. The catheter aspirates and flushes normally and is ready for immediate use. IMPRESSION: Successful ultrasound and fluoroscopic guided placement of a right brachial vein approach, 40 cm, 5 [place], single lumen PICC with tip at the superior caval-atrial junction. The PICC line is ready for immediate use. Electronically Signed   By: [name] M.D.   On: 12/12/2016 11:57    Impression/Plan: 1. 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 The patient is recovering from the effects of treatment and is clinically improving since her last visit with me. We will plan to see her back in 2 more weeks to re-assess. 2. Pain secondary to treatment side effects from #1. An additional prescription was provided for Oxycodone.  3. 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 The patient is interested in discussing this with social work and possibly a Social worker. We will contact social work for them to coordinate with the patient. 4. Bilateral lower extremity lymphedema. The patient will continue working with PT for management of this.    Carola Rhine, PAC

## 2017-01-06 ENCOUNTER — Telehealth: Payer: Self-pay | Admitting: *Deleted

## 2017-01-06 NOTE — Telephone Encounter (Signed)
Call from [place] at [facility] requesting diagnosis code for oxycodone script. C21.0 given.

## 2017-01-09 ENCOUNTER — Telehealth: Payer: Self-pay | Admitting: *Deleted

## 2017-01-09 NOTE — Telephone Encounter (Signed)
"  I have an appointment for lab and Ashley Moore next Monday.  I'm having transportation problems.  Could the appointments begin later.  Anything after 12:00 or 1:00 pm.  Return number [phone]."   Scheduling message sent.

## 2017-01-13 ENCOUNTER — Encounter: Payer: Self-pay | Admitting: *Deleted

## 2017-01-13 NOTE — Progress Notes (Signed)
[place] Work  Clinical Social Work was referred by radiation oncology for counseling.  Clinical Social Worker has talked with pt in the past and contacted patient at home to offer support and assess for needs.  CSW reviewed common emotions patients experience near or at the end of treatment as they move into survivorship. Pt appears to be having very common anxiety related to processing her treatment experience, what happens next and struggling to move forward. CSW provided supportive listening. CSW and pt to meet for supportive counseling in 2 weeks when she returns for radonc follow up. No CSW availability on 01/16/17. Pt aware to contact CSW for appt when radonc appt is made and to call as needed. Pt appreciated call and validation of emotions.   Clinical Social Work interventions:  Supportive listening Loren Racer, LCSW, OSW-C Clinical Social Worker [place]  [place] Phone: [phone] Fax: [phone]

## 2017-01-16 ENCOUNTER — Telehealth: Payer: Self-pay | Admitting: Oncology

## 2017-01-16 ENCOUNTER — Other Ambulatory Visit (HOSPITAL_BASED_OUTPATIENT_CLINIC_OR_DEPARTMENT_OTHER): Payer: Medicare HMO

## 2017-01-16 ENCOUNTER — Other Ambulatory Visit: Payer: Self-pay

## 2017-01-16 ENCOUNTER — Ambulatory Visit: Payer: Self-pay | Admitting: Nurse Practitioner

## 2017-01-16 ENCOUNTER — Ambulatory Visit (HOSPITAL_BASED_OUTPATIENT_CLINIC_OR_DEPARTMENT_OTHER): Payer: Medicare HMO | Admitting: Nurse Practitioner

## 2017-01-16 VITALS — BP 122/78 | HR 85 | Temp 98.2°F | Resp 18 | Wt 181.3 lb

## 2017-01-16 DIAGNOSIS — G893 Neoplasm related pain (acute) (chronic): Secondary | ICD-10-CM | POA: Diagnosis not present

## 2017-01-16 DIAGNOSIS — C210 Malignant neoplasm of anus, unspecified: Secondary | ICD-10-CM

## 2017-01-16 LAB — BASIC METABOLIC PANEL
Anion Gap: 8 mEq/L (ref 3–11)
BUN: 14.2 mg/dL (ref 7.0–26.0)
CALCIUM: 9.3 mg/dL (ref 8.4–10.4)
CHLORIDE: 104 meq/L (ref 98–109)
CO2: 26 meq/L (ref 22–29)
CREATININE: 0.7 mg/dL (ref 0.6–1.1)
EGFR: >90 mL/min/{1.73_m2} (ref >90)
GLUCOSE: 96 mg/dL (ref 70–140)
Potassium: 4.2 mEq/L (ref 3.5–5.1)
Sodium: 138 mEq/L (ref 136–145)

## 2017-01-16 NOTE — Telephone Encounter (Signed)
Follow up appointment with Dr Benay Spice was scheduled in 8 weeks,  per 01/16/17 los. Patient was given a copy of the AVS report and appointment schedule, per 01/16/17 los.

## 2017-01-16 NOTE — Progress Notes (Signed)
  [place] OFFICE PROGRESS NOTE   Diagnosis:  Anal cancer  INTERVAL HISTORY:   Ashley Moore returns as scheduled. Pain at the rectum is better. She has discontinued the Duragesic patch. She is taking oxycodone about every 5 hours. Bowels are moving. No diarrhea. She has had some constipation. She takes a laxative as needed. No rectal bleeding. She reports a good appetite. Leg swelling is better. She is interested in counseling regarding coping with the diagnosis and treatment.  Objective:  Vital signs in last 24 hours:  Blood pressure 122/78, pulse 85, temperature 98.2 F (36.8 C), temperature source Oral, resp. rate 18, weight 181 lb 4.8 oz (82.2 kg), SpO2 100 %.    HEENT: No thrush or ulcers. Lymphatics: No palpable inguinal lymph nodes. Resp: Lungs clear bilaterally. Cardio: Regular rate and rhythm. GI: Abdomen soft and nontender. No hepatomegaly.  Vascular: She is wearing bilateral leg wraps. Skin: Erythema with superficial ulceration at the perineum. Also area of superficial ulceration left upper medial groin/inner thigh thigh. Perianal region obscured by stool. No obvious anal mass externally. Labial lesions have resolved.   Lab Results:  Lab Results  Component Value Date   WBC 4.3 12/27/2016   HGB 10.3 (L) 12/27/2016   HCT 31.1 (L) 12/27/2016   MCV 82.3 12/27/2016   PLT 158 12/27/2016   NEUTROABS 2.9 12/26/2016    Imaging:  No results found.  Medications: I have reviewed the patient's current medications.  Assessment/Plan: 1. Anal cancer ? CT abdomen/pelvis 10/21/2016-thickening of the anus extending to the junction between the anus and rectum with a large stool ball in the rectum and mild fat stranding posterior to the rectum. Enlarged lymph nodes in the right and left inguinal regions. A few mildly prominent nodesseen posterior to the rectum. ? 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 ? PET scan [id number]  hypermetabolic anal mass with hypermetabolic metastases to the groin region bilaterally, left pelvic sidewall and presacral space. ? Initiation of radiation and cycle 1 5-FU/mitomycin C 11/14/2016 ? Cycle 2 5-FU/mitomycin C 12/12/2016 (5-FU dose reduced due to mucositis, diarrhea, skin breakdown) ? Radiation completed 12/23/2016 2. Left labial lesions. Question direct extension from the anal cancer versus metastatic disease from anal cancer versus a separate malignant process. 3. Pain and bleeding secondary to #1 and skin breakdown 4. 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 5. Multiple family members with breast cancer. 6. Hypokalemia-likely secondary to decreased nutritional intake and diarrhea   Disposition: Ashley Moore appears stable. The skin toxicity is resolving. Pain is slowly improving. She has discontinued the Duragesic patch and is weaning oxycodone as tolerated. She will continue the same.  I have spoken with Ashley Flank, PA in radiation oncology. She is scheduled to see Ashley Moore next week and we will make a referral to Ashley Moore, surgery [facility]) for anoscopy follow-up.  We will ask social work to meet with her while she is in the office today and arrange for counseling services.   We scheduled a return visit here in 8 weeks. She will contact the office in the interim with any problems.  Plan reviewed with Dr. Benay Moore.    Ashley Moore ANP/GNP-BC   01/16/2017  2:55 PM

## 2017-01-18 NOTE — Progress Notes (Addendum)
Ashley Moore 65 y.o. woman with Anal cancer radiation completed 12-23-16, one month FU.  Pain: Reports arthritic pain in knees,hand and neck   Using Oxycodone for pain control Nausea/ Vomiting:None, has Zofran and compazine for nausea Diarrhea:No Bladder issues: Reports rare dysuria wears an adult brief Vaginal/Rectal bleeding:None Skin irritation: Reports still having a lot of blisters to bilateral groin the  buttocks blisters have resolved.     Using silvadene to bilateral groin moist desquamation has resolved. Fatigue:Denies fatigue does take a nap during the day for healing. Legs with less swelling today.  Reports she is still experiencing panic attacks two since her last visit taking Ativan as needed, met with a social worker 01-13-17 to have a follow up meeting in the future to discuss her panic attacks and anxiety. Loss of appetite:Appetite has improved eating three meals per day and snacking. Reports her legs are less swollen today but her feet are still swollen, doing some leg exercises each day. 01-13-17 Met with social services. 12-26-16 Saw Dr. Gary Sherrell  Ms. Gallina continues to have significant pain, likely secondary to radiation skin toxicity. She is maintained on a Duragesic patch. I encouraged her to wean the use of oxycodone as tolerated. Hopefully the pain will improve significantly over the next few weeks.  We will confirm she is taking the potassium Supplement. We will check a potassium level when she returns to see radiation oncology next week.  She will return for an office visit in one month. Wt Readings from Last 3 Encounters:  01/24/17 179 lb (81.2 kg)  01/16/17 181 lb 4.8 oz (82.2 kg)  01/05/17 188 lb (85.3 kg)  BP 121/77   Pulse 72   Temp 98.1 F (36.7 C) (Oral)   Resp 18   Ht 5' 2" (1.575 m)   Wt 179 lb (81.2 kg)   SpO2 100%   BMI 32.74 kg/m  

## 2017-01-22 ENCOUNTER — Other Ambulatory Visit: Payer: Self-pay | Admitting: Radiation Oncology

## 2017-01-23 NOTE — Telephone Encounter (Signed)
Pt left vm wanting refill on silvadene

## 2017-01-24 ENCOUNTER — Telehealth: Payer: Self-pay | Admitting: *Deleted

## 2017-01-24 ENCOUNTER — Ambulatory Visit
Admission: RE | Admit: 2017-01-24 | Discharge: 2017-01-24 | Disposition: A | Discharge status: Home / Self Care | Payer: Medicare HMO | Source: Ambulatory Visit | Attending: Radiation Oncology | Admitting: Radiation Oncology

## 2017-01-24 ENCOUNTER — Encounter: Payer: Self-pay | Admitting: Radiation Oncology

## 2017-01-24 VITALS — BP 121/77 | HR 72 | Temp 98.1°F | Resp 18 | Ht 62.0 in | Wt 179.0 lb

## 2017-01-24 DIAGNOSIS — Z79899 Other long term (current) drug therapy: Secondary | ICD-10-CM | POA: Diagnosis not present

## 2017-01-24 DIAGNOSIS — R52 Pain, unspecified: Secondary | ICD-10-CM | POA: Insufficient documentation

## 2017-01-24 DIAGNOSIS — F064 Anxiety disorder due to known physiological condition: Secondary | ICD-10-CM | POA: Diagnosis not present

## 2017-01-24 DIAGNOSIS — Z885 Allergy status to narcotic agent status: Secondary | ICD-10-CM | POA: Insufficient documentation

## 2017-01-24 DIAGNOSIS — Z923 Personal history of irradiation: Secondary | ICD-10-CM | POA: Diagnosis not present

## 2017-01-24 DIAGNOSIS — I890 Lymphedema, not elsewhere classified: Secondary | ICD-10-CM | POA: Diagnosis not present

## 2017-01-24 DIAGNOSIS — R300 Dysuria: Secondary | ICD-10-CM | POA: Diagnosis not present

## 2017-01-24 DIAGNOSIS — Z880 Allergy status to penicillin: Secondary | ICD-10-CM | POA: Diagnosis not present

## 2017-01-24 DIAGNOSIS — Z9221 Personal history of antineoplastic chemotherapy: Secondary | ICD-10-CM | POA: Diagnosis not present

## 2017-01-24 DIAGNOSIS — Z888 Allergy status to other drugs, medicaments and biological substances status: Secondary | ICD-10-CM | POA: Diagnosis not present

## 2017-01-24 DIAGNOSIS — C210 Malignant neoplasm of anus, unspecified: Secondary | ICD-10-CM

## 2017-01-24 NOTE — Telephone Encounter (Signed)
CALLED PATIENT TO INFORM OF PT APPT. FOR 01-25-17 @ 10:15 AM @ [facility], SPOKE WITH PATIENT AND SHE IS AWARE OF THIS APPT.

## 2017-01-24 NOTE — Addendum Note (Signed)
Encounter addended by: Malena Edman, RN on: 01/24/2017 12:19 PM<BR>    Actions taken: Charge Capture section accepted

## 2017-01-24 NOTE — Telephone Encounter (Signed)
CALLED PATIENT TO INFORM OF APPT. WITH DR. Benjaman Pott ROBINSON ON 05-11-17- ARRIVAL TIME - 10:15 AM @ [place] PH. NO. - [phone], PT. AWARE OF APPT.

## 2017-01-24 NOTE — Progress Notes (Signed)
Radiation Oncology         [phone] ________________________________  Name: Ashley Moore MRN: [id number]  Date: 01/24/2017  DOB: [birth year]  Post Treatment Note  CC: Panosh, Standley Brooking, MD  Leighton Ruff, MD  Diagnosis:   Stage IIIA, [phone] squamous cell carcinoma of the anus.  Interval Since Last Radiation:  4 weeks  11/14/16-12/23/16: 54 Gy to the pelvic nodes, anus, and bilateral groins.  Narrative: Ashley Moore is a pleasant 66 y.o. female with a history of squamous cell carcinoma of the anus. When she was diagnosed, she had advanced locoregional disease with bilateral inguinal adenopathy, PET positive presacral, left pelvic sidewall, and a lesion of the left labia consistent with a satellite lesion. She was treated with 5FU and radiotherapy which she completed about 4 weeks ago. She comes today for repeat evaluation. About two weeks ago when I saw her, she was still having pain at the rectum and her skin was improved though wet desquamation was present.                  On review of systems, the patient reports that she is doing well overall. She reports her pain is minimal, and she continues to use Silvadene to her skin that's still itching. She is not using any long acting narcotics any longer, and is rarely taking oxycodone at this time. She denies any chest pain, shortness of breath, cough, fevers, chills, night sweats, unintended weight changes. She continues using compression garments for her bilateral lower extremity lymphedema. She denies any bowel or bladder disturbances, denies rectal bleeding or pain with BM, and denies abdominal pain, nausea or vomiting. She denies any new musculoskeletal or joint aches or pains, new skin lesions or concerns. She was very anxious last time I saw her and reports that this has improved with prn use of Ativan. She is meeting with social work for some counseling. A complete review of systems is obtained and is otherwise negative.   ALLERGIES:   is allergic to dilaudid [hydromorphone hcl]; benadryl [diphenhydramine]; melatonin; and penicillins.  Meds: Current Outpatient Prescriptions  Medication Sig Dispense Refill  . gabapentin (NEURONTIN) 100 MG capsule TAKE 1 CAPSULE THREE TIMES DAILY 270 capsule 3  . LORazepam (ATIVAN) 0.5 MG tablet Take 0.5 mg by mouth every 4 (four) hours as needed for anxiety. Take 1 tab oral every 4-6 hours prn for anxiety or nausea    . meloxicam (MOBIC) 15 MG tablet TAKE 1 TABLET EVERY DAY 90 tablet 0  . Oxycodone HCl 10 MG TABS Take 1 tablet (10 mg total) by mouth every 4 (four) hours as needed. 120 tablet 0  . potassium chloride SA (K-DUR,KLOR-CON) 20 MEQ tablet Take 1 tablet (20 mEq total) by mouth daily. 30 tablet 0  . silver sulfADIAZINE (SILVADENE) 1 % cream Apply 1 application topically 2 (two) times daily. Apply to areas of skin affected, mush wash off before re-applying    . silver sulfADIAZINE (SILVADENE) 1 % cream APPLY 1 APPLICATION TOPICALLY 2 (TWO) TIMES DAILY. 400 g 0  . tiZANidine (ZANAFLEX) 4 MG tablet TAKE 1 TABLET EVERY 8 HOURS AS NEEDED FOR MUSCLE SPASM(S) 90 tablet 5  . loperamide (IMODIUM) 2 MG capsule Take 1 capsule (2 mg total) by mouth 4 (four) times daily as needed for diarrhea or loose stools. (Patient not taking: Reported on 01/05/2017) 12 capsule 0  . ondansetron (ZOFRAN) 4 MG tablet Take 1 tablet (4 mg total) by mouth every 8 (eight) hours as needed  for nausea or vomiting. (Patient not taking: Reported on 01/05/2017) 20 tablet 0  . phenazopyridine (PYRIDIUM) 200 MG tablet Take 1 tablet (200 mg total) by mouth 3 (three) times daily as needed for pain. (Patient not taking: Reported on 12/27/2016) 40 tablet 1  . prochlorperazine (COMPAZINE) 10 MG tablet Take 1 tablet (10 mg total) by mouth every 6 (six) hours as needed for nausea or vomiting. (Patient not taking: Reported on 01/16/2017) 40 tablet 0   No current facility-administered medications for this encounter.     Physical  Findings:  height is 5\' 2"  (1.575 m) and weight is 179 lb (81.2 kg). Her oral temperature is 98.1 F (36.7 C). Her blood pressure is 121/77 and her pulse is 72. Her respiration is 18 and oxygen saturation is 100%.  Pain Assessment Pain Score: 4  (Knees, Hands,neck)/10 In general this is a well appearing caucasian female in no acute distress. She's alert and oriented x4 and appropriate throughout the examination. Cardiopulmonary assessment is negative for acute distress and she exhibits normal effort. Pelvic exam reveals normal appearing tissues of the labial, perineum, and perianal region. No visible tumor is noted and no evidence of desquamation is present. No palpable adenopathy is noted in the groin bilaterally.   Lab Findings: Lab Results  Component Value Date   WBC 4.3 12/27/2016   HGB 10.3 (L) 12/27/2016   HCT 31.1 (L) 12/27/2016   MCV 82.3 12/27/2016   PLT 158 12/27/2016     Radiographic Findings: No results found.  Impression/Plan: 1. 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 The patient is only 4 weeks out from treatment but appears to have a complete response. We outlined NCCN guidelines for surveillance. She has plans to see Dr. Benay Spice for her first surveillance appointment in June. She is interested in relocating her surgical care to Dr. Quentin Cornwall at [facility] and I've contacted him about her case previously. She will be due for Anoscopy in September and she will see him around that time. I will plan to provide her GYN care and see her as well in surveillance around December of this year. I've spoken with Dr. Benay Spice in medical onocology and he would recommend her first CT be in about 6 months. She will proceed with this CT scan of the C/A/P within our system in October, ordered by either Dr. Benay Spice or myself.  2. Pain secondary to treatment side effects from #1. The patient is doing great and we do not expect that she will need any additional pain  management. 3. 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 The patient is doing much better and will continue to meet with social work for counseling. 4. Bilateral lower extremity lymphedema. The patient will continue working with PT for management of this. 5. Preventative care. The patient is at risk of vaginal and rectal stenosis. She will begin using a dilator next week and meet with pelvic floor rehabilitation. Dilators were provided with instruction on their use.    Carola Rhine, PAC

## 2017-01-25 ENCOUNTER — Encounter: Payer: Self-pay | Admitting: Radiation Oncology

## 2017-01-25 ENCOUNTER — Ambulatory Visit: Payer: Medicare HMO | Admitting: Physical Therapy

## 2017-01-25 NOTE — Progress Notes (Signed)
  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 Patient reported 4/10 pain to the treatment area. She also reported dysuria and nausea.  Plan: The patient has completed radiation treatment. The patient will return to radiation oncology clinic for routine followup in one month. I advised them to call or return sooner if they have any questions or concerns related to their recovery or treatment.  ------------------------------------------------  Jodelle Gross, MD, PhD  This document serves as a record of services personally performed by Kyung Rudd, MD. It was created on his behalf by Bethann Humble, a trained medical scribe. The creation of this record is based on the scribe's personal observations and the provider's statements to them. This document has been checked and approved by the attending provider.

## 2017-01-26 ENCOUNTER — Encounter: Payer: Self-pay | Admitting: *Deleted

## 2017-01-26 NOTE — Progress Notes (Signed)
[place] Work  Clinical Social Work was referred by patient for assessment of psychosocial needs and supportive counseling session. Clinical Social Worker met with patient at [facility] in [place] office to offer support and assess for needs. Pt has anxiety and common concerns experienced as a pt moves in to survivorship. CSW provided education and supportive counseling session. Pt also had many grief and loss events prior to her cancer diagnosis that continue to be difficult for her. Pt and CSW plan to explore ways to process cancer experience, find ways to decrease anxiety and improve coping abilities. Pt open to GI Cancer Support group, [employer] and [employer]. Pt and CSW will meet for another counseling session on 02/08/17. Pt plans to attend "one support activity" in the next two weeks and attempt to identify behavioral methods helpful to decrease her anxiety. Pt agrees to reach out as needed.   Loren Racer, LCSW, OSW-C Clinical Social Worker [place]  [place] Phone: [phone] Fax: [phone]

## 2017-01-30 ENCOUNTER — Ambulatory Visit: Payer: Medicare HMO | Admitting: Physical Therapy

## 2017-01-31 ENCOUNTER — Encounter: Payer: Self-pay | Admitting: Physical Therapy

## 2017-01-31 ENCOUNTER — Ambulatory Visit: Payer: Medicare HMO | Attending: Radiation Oncology | Admitting: Physical Therapy

## 2017-01-31 DIAGNOSIS — I890 Lymphedema, not elsewhere classified: Secondary | ICD-10-CM | POA: Diagnosis not present

## 2017-01-31 NOTE — Therapy (Signed)
[place], [place], [address] Phone: [phone]   Fax:  [phone]  Physical Therapy Treatment  Patient Details  Name: Ashley Moore MRN: [id number] Date of Birth: [birth year] Referring Provider: Shona Simpson, NP  Encounter Date: 01/31/2017      PT End of Session - 01/31/17 1222    Visit Number 5   Number of Visits 12   Date for PT Re-Evaluation 01/12/17   PT Start Time 1026   PT Stop Time 1100   PT Time Calculation (min) 34 min   Activity Tolerance Patient tolerated treatment well   Behavior During Therapy [facility] for tasks assessed/performed      Past Medical History:  Diagnosis Date  . Bowen's disease    excised 1992  . Headache   . Hx of varicella   . 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([place])   . Thyroid cancer [facility])     Past Surgical History:  Procedure Laterality Date  . BUNIONECTOMY Right   . IR GENERIC HISTORICAL  11/14/2016   IR US GUIDE VASC ACCESS RIGHT 11/14/2016 [facility]INTER[facility]  . IR GENERIC HISTORICAL  11/14/2016   IR FLUORO GUIDE CV LINE RIGHT 11/14/2016 [facility]  . IR GENERIC HISTORICAL  12/12/2016   IR US GUIDE VASC ACCESS RIGHT 12/12/2016 [name], MD [facility]  . IR GENERIC HISTORICAL  12/12/2016   IR FLUORO GUIDE CV LINE RIGHT 12/12/2016 [name], MD [facility]  . SKIN CANCER EXCISION     bowens disease    There were no vitals filed for this visit.      Subjective Assessment - 01/31/17 1027    Subjective Before this happened before I even knew I had cancer I broke this foot. That was a year and a half or two years before I cam here. I wore these compression foot pieces and they worked just fine. I then started using them on both feet. The only swelling I see is at my left ankle. 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 I stood most of the time. I am walking and I am exercising. I am doing everything I can on my feet.    Pertinent History  Diagnosed with anal cancer 6 weeks ago but reports being misdiagnosed by 3 doctors over the past year as having hemorrhoids. They began chemo and radiation simultaneously about a month ago. 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   How long can you sit comfortably? Few minutes   Patient Stated Goals Reduce leg swelling   Currently in Pain? No/denies   Pain Score 0-No pain               LYMPHEDEMA/ONCOLOGY QUESTIONNAIRE - 01/31/17 1033      Right Lower Extremity Lymphedema   10 cm Proximal to Suprapatella 54 cm   At Midpatella/Popliteal Crease 39.1 cm   30 cm Proximal to Floor at Lateral Plantar Foot 43 cm   20 cm Proximal to Floor at Lateral Plantar Foot 35._0 cm Proximal to Floor at Lateral Malleoli 24.6 cm   5 cm Proximal to 1st MTP Joint 22.5 cm   Across MTP Joint 22.4 cm   Around Proximal Great Toe 7.8 cm     Left Lower Extremity Lymphedema   10 cm Proximal to Suprapatella 52.5 cm   At Midpatella/Popliteal Crease 42 cm   30 cm Proximal to Floor at Lateral Plantar Foot 43 cm  20 cm Proximal to Floor at Lateral Plantar Foot 33.5 cm   10 cm Proximal to Floor at Lateral Malleoli 25.5 cm   5 cm Proximal to 1st MTP Joint 22.9 cm   Across MTP Joint 23.1 cm   Around Proximal Great Toe 7.8 cm                  [facility] Adult PT Treatment/Exercise - 01/31/17 0001      Manual Therapy   Manual therapy comments remeasured circumferences, discussed with pt leaving garments off to assess for swelling since pt feels swelling was side effect of chemo,  educated pt in self MLD                PT Education - 01/31/17 1219    Education provided Yes   Education Details self MLD, compression garment wear schedule and leaving garments off to assess if swellling will return, apply garments immediately if swelling does return, compression stockings, compression ankle garments pt owns and when to wear   Person(s) Educated Patient   Methods  Handout;Demonstration;Explanation   Comprehension Verbalized understanding;Returned demonstration                [place] - 01/31/17 1220      CC Long Term Goal  #1   Title Patient will verbalize understanding of lymphedema including treatment and maintenance.   Time 4   Period Weeks   Status Achieved     CC Long Term Goal  #2   Title Reduce bilateral legs to </= 22 cm at 5 cm proximal to 5th MTP.   Baseline 22.5 and 22.9 on L and R on 01/31/17   Time 4   Period Weeks   Status Partially Met     CC Long Term Goal  #3   Title Reduce bilateral legs to </= 35 cm at 20 cm proximal to floor at lateral left leg.   Baseline 33.5 on 01/31/17   Time 4   Period Weeks   Status Achieved     CC Long Term Goal  #4   Title Patient will verbalize where and how to be fitted for compression garments for BLE.   Baseline pt has been fitted and received garments -01/31/17   Time 4   Period Weeks   Status Achieved            Plan - 01/31/17 1222    Clinical Impression Statement Patient is doing very well. She is no longer having pain. She has not been able to attend her appointments over the last month due to coverage issues where she works. Over the weekend she worked a 12 hour shift. She has been wearing her velcro compression garments during daytime hours consistently. She does not demonstrate any signficant swelling except at left inner ankle which she reports has been there for years. She wears a compression anklet that controls this. She is not sure if she had lymphedema or if her swelling was a result of the chemotherapy. Educated pt that it could be either and she could try to go without her garments and assess if her swelling returns. If it returns pt knows to wear her compression garments. She was also reeducated in self MLD to BLEs for management of any edema. She also is independent in cutting the reduction garments to fit if she needs to. She has met goals for  therapy and will discharged from skilled PT services at this time.  Rehab Potential Good   Clinical Impairments Affecting Rehab Potential Extent of disease   PT Frequency 3x / week   PT Duration 4 weeks   PT Treatment/Interventions ADLs/Self Care Home Management;Patient/family education;Compression bandaging;Manual lymph drainage;Manual techniques   PT Next Visit Plan dc this visit   Consulted and Agree with Plan of Care Patient      Patient will benefit from skilled therapeutic intervention in order to improve the following deficits and impairments:  Increased edema, Pain, Decreased knowledge of precautions, Decreased knowledge of use of DME, Difficulty walking, Postural dysfunction  Visit Diagnosis: Lymphedema, not elsewhere classified       G-Codes - 02-21-2017 1226    Functional Assessment Tool Used (Outpatient Only) Clinical Judgement   Functional Limitation Other PT primary   Other PT Primary Goal Status (K9326) At least 20 percent but less than 40 percent impaired, limited or restricted   Other PT Primary Discharge Status (Z1245) At least 1 percent but less than 20 percent impaired, limited or restricted      Problem List Patient Active Problem List   Diagnosis Date Noted  . Port catheter in place 11/21/2016  . 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 Anal cancer ([place]) 11/03/2016  . Cystitis 05/06/2015  . Degenerative cervical disc 04/08/2015  . Trigger point of neck 03/26/2015  . Pain in joint, ankle and foot 04/23/2013  . Visit for preventive health examination 02/13/2013  . Routine gynecological examination 02/13/2013  . Family history of breast cancer in first degree relative 02/13/2013  . Rash and nonspecific skin eruption 02/13/2013  . 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 VITAMIN D DEFICIENCY 06/25/2008  . NECK PAIN 06/02/2008  . FOOT PAIN 06/02/2008  . OSTEOPENIA 06/02/2008    Allyson Sabal [facility] Feb 21, 2017, 12:27 PM  [place], [place], [address] Phone: [phone]   Fax:  [phone]  Name: Ashley Moore MRN: [id number] Date of Birth: 12/31/1950  PHYSICAL THERAPY DISCHARGE SUMMARY  Visits from Start of Care: 5  Current functional level related to goals / functional outcomes: Pt has met all goals for therapy, she is able to independently manage her edema through self MLD and garments   Remaining deficits: none   Education / Equipment: Self MLD, bilateral LE compression garments Plan: Patient agrees to discharge.  Patient goals were met. Patient is being discharged due to meeting the stated rehab goals.  ?????    Allyson Sabal [place], [place] February 21, 2017 12:28 PM

## 2017-01-31 NOTE — Patient Instructions (Signed)
Deep Effective Breath   Standing, sitting, or laying down place both hands on the belly. Take a deep breath IN, expanding the belly; then breath OUT, contracting the belly. Repeat __5__ times. Do __2-3__ sessions per day and before each self massage.  http://gt2.exer.us/866   Copyright  VHI. All rights reserved.  Inguinal Nodes to Axilla - Clear   On involved side, at armpit, make _5__ in-place circles. Then from hip proceed in sections to armpit with stationary circles or pumps _5_ times, this is your pathway. Do _1__ time per day.  Copyright  VHI. All rights reserved.  LEG: Knee to Hip - Clear   Pump up outer thigh of involved leg from knee to outer hip. Then do stationary circles from inner to outer thigh, then do outer thigh again. Next, interlace fingers behind knee IF ABLE and make in-place circles. Do _5_ times of each sequence.  Do _1__ time per day.  Copyright  VHI. All rights reserved.  LEG: Ankle to Hip Sweep   Hands on sides of ankle of involved leg, pump _5__ times up both sides of lower leg, then retrace steps up outer thigh to hip as before and back to pathway. Do _2-3_ times. Do __1_ time per day.  Copyright  VHI. All rights reserved.  FOOT: Dorsum of Foot and Toes Massage   One hand on top of foot make _5_ stationary circles or pumps, then either on top of toes or each individual toe do _5_ pumps. Then retrace all steps pumping back up both sides of lower leg, outer thigh, and then pathway. Finish with what you started with, _5_ circles at involved side arm pit. All _2-3_ times at each sequence. Do _1__ time per day.  Copyright  VHI. All rights reserved.    

## 2017-02-07 ENCOUNTER — Telehealth: Payer: Self-pay | Admitting: Oncology

## 2017-02-07 NOTE — Telephone Encounter (Signed)
Faxed records to [facility]ystem, [place]

## 2017-02-08 ENCOUNTER — Encounter: Payer: Self-pay | Admitting: *Deleted

## 2017-02-09 ENCOUNTER — Telehealth: Payer: Self-pay | Admitting: *Deleted

## 2017-02-09 NOTE — Progress Notes (Signed)
[place] Work  Clinical Social Work was referred by patient for assessment of psychosocial needs and supportive counseling session. Clinical Social Worker met with patient at [facility] in [place] office for second counseling session. Pt has several symptoms of ongoing depression, cannot focus, tearful, feels "stuck".  Pt has been very busy since last session and working remodeling her home, settling financial concerns. CSW provided education and supportive counseling session. Pt also had many grief and loss events prior to her cancer diagnosis that continue to be difficult for her. Pt and CSW will meet for another counseling session on 02/22/17. Pt plans to attend "one support activity" in the next two weeks and attempt to identify behavioral methods helpful to decrease her wide range of emotions. CSW referring pt to her PCP to have discussion about SSRI to assist with depression.   Loren Racer, LCSW, OSW-C Clinical Social Worker [place]  [place] Phone: [phone] Fax: [phone]

## 2017-02-09 NOTE — Telephone Encounter (Signed)
-----  Message from [name], MD sent at 02/08/2017  8:30 PM EDT ----- Regarding: FW: Depression, survivor issues Please call to let her know it is okay to resume her usual activities Ask about her pain Thanks ----- Message ----- From: Lucie Leather Sent: 02/08/2017   3:32 PM To: Hayden Pedro, PA-C, # Subject: Depression, survivor issues                    Hi,  I met with Ashley Moore today for our second counseling session. Many of her issues are related to her cancer treatment and trying to move forward. This is also complicated by unresolved grief due to recent losses right before her cancer diagnosis. She is clearly depressed. I feel she would greatly benefit from some sort of SSRI in addition to counseling. Are you all willing to assist or should I refer her to contact her PCP? We plan to do a few more sessions of counseling and is very willing to do the work.  She had many questions today that I cannot answer as they are medical in nature. She had tried to contact you both without success and I suggested to her that reaching out via mychart may be helpful. Her questions include: "Can I have a glass of wine again? Go swimming? Get a pedicure and dye my hair?" She is still having pain and is worried about that as well.  It would be great if you all could contact her.  Thanks,  Loren Racer, LCSW, OSW-C Clinical Social Worker [place]  [facility] Phone: [phone] Fax: [phone]

## 2017-02-09 NOTE — Telephone Encounter (Signed)
Call placed to patient to notify her per order of Dr. Benay Spice that it is okay to resume her usual activities.  Patient states that her pain is mainly when she voids d/t side effects from radiation.  Note from Dr. Lisbeth Renshaw on 01/25/17 states that pt needs a f/u appt in one month.  Message left with J. Roslynn Amble, Dr. Ida Rogue nurse to call patient regarding radiation f/u appt. Patient appreciative of call and has no further questions at this time.

## 2017-02-09 NOTE — Telephone Encounter (Signed)
Called and left  vm asking about status of patient pain, if she feels she has a UTI to drop off  A sample with [place] and we will run it via the lab, but per Shona Simpson, to still continue to use the dilator, Surgilube, coconut oil or vitamin e, if none of those do not help, patient may need to go on estrogen cream, call for any other questions/concers 2:25 PM

## 2017-02-09 NOTE — Telephone Encounter (Signed)
Patient returned call stating "'she hasn't used the dilator or any of the lubricants" suggested + By Shona Simpson, she started crying while talking on phone, [place]", [place], SAYS WHEN SHE VOIDS [place]"  TRANSFERRED CALL TO Melanie Nunnely. 3:06 PM  /

## 2017-02-10 ENCOUNTER — Encounter: Payer: Self-pay | Admitting: Internal Medicine

## 2017-02-14 ENCOUNTER — Encounter: Payer: Self-pay | Admitting: Radiation Oncology

## 2017-02-14 ENCOUNTER — Ambulatory Visit: Payer: Medicare HMO

## 2017-02-14 ENCOUNTER — Ambulatory Visit
Admission: RE | Admit: 2017-02-14 | Discharge: 2017-02-14 | Disposition: A | Discharge status: Home / Self Care | Payer: Medicare HMO | Source: Ambulatory Visit | Attending: Radiation Oncology | Admitting: Radiation Oncology

## 2017-02-14 ENCOUNTER — Other Ambulatory Visit: Payer: Self-pay | Admitting: Nurse Practitioner

## 2017-02-14 VITALS — BP 138/84 | HR 80 | Temp 97.9°F | Resp 18 | Ht 62.0 in | Wt 177.0 lb

## 2017-02-14 DIAGNOSIS — F064 Anxiety disorder due to known physiological condition: Secondary | ICD-10-CM | POA: Diagnosis not present

## 2017-02-14 DIAGNOSIS — Z79899 Other long term (current) drug therapy: Secondary | ICD-10-CM | POA: Diagnosis not present

## 2017-02-14 DIAGNOSIS — Z923 Personal history of irradiation: Secondary | ICD-10-CM | POA: Diagnosis not present

## 2017-02-14 DIAGNOSIS — C210 Malignant neoplasm of anus, unspecified: Secondary | ICD-10-CM

## 2017-02-14 DIAGNOSIS — R52 Pain, unspecified: Secondary | ICD-10-CM | POA: Diagnosis not present

## 2017-02-14 DIAGNOSIS — Z880 Allergy status to penicillin: Secondary | ICD-10-CM | POA: Diagnosis not present

## 2017-02-14 DIAGNOSIS — I890 Lymphedema, not elsewhere classified: Secondary | ICD-10-CM | POA: Diagnosis not present

## 2017-02-14 DIAGNOSIS — C7982 Secondary malignant neoplasm of genital organs: Secondary | ICD-10-CM

## 2017-02-14 DIAGNOSIS — R300 Dysuria: Secondary | ICD-10-CM | POA: Diagnosis not present

## 2017-02-14 DIAGNOSIS — Z9221 Personal history of antineoplastic chemotherapy: Secondary | ICD-10-CM | POA: Diagnosis not present

## 2017-02-14 LAB — URINALYSIS, MICROSCOPIC - CHCC
BLOOD: NEGATIVE
Bacteria, UA: NEGATIVE
Bilirubin (Urine): NEGATIVE
GLUCOSE UR CHCC: NEGATIVE mg/dL
Ketones: NEGATIVE mg/dL
NITRITE: NEGATIVE
PROTEIN: NEGATIVE mg/dL
RBC / HPF: NEGATIVE (ref 0–2)
SPECIFIC GRAVITY, URINE: 1.01 (ref 1.003–1.035)
UROBILINOGEN UR: 0.2 mg/dL (ref 0.2–1)
pH: 6 (ref 4.6–8.0)

## 2017-02-14 MED ORDER — ESTRADIOL 0.1 MG/GM VA CREA: 0.5000 g | TOPICAL_CREAM | Freq: Every day | VAGINAL | 12 refills | Status: DC

## 2017-02-14 NOTE — Telephone Encounter (Signed)
Make appt with me ( 2 slots)   About this. ASAP

## 2017-02-14 NOTE — Progress Notes (Signed)
Radiation Oncology         [phone] ________________________________  Name: Ashley Moore MRN: [id number]  Date: 02/14/2017  DOB: November 25, 1950  Post Treatment Note  CC: Panosh, Standley Brooking, MD  Leighton Ruff, MD  Diagnosis:   Stage IIIA, [phone] squamous cell carcinoma of the anus.  Interval Since Last Radiation:  8 weeks  11/14/16-12/23/16: 54 Gy to the pelvic nodes, anus, and bilateral groins.  Narrative: Ashley Moore is a pleasant 66 y.o. female with a history of squamous cell carcinoma of the anus. When she was diagnosed, she had advanced locoregional disease with bilateral inguinal adenopathy, PET positive presacral, left pelvic sidewall, and a lesion of the left labia consistent with a satellite lesion. She was treated with 5FU and radiotherapy which she completed about 8 weeks ago. She was last seen about 4 weeks ago and had significant improvement of her desquamation from treatment. She comes today with concerns about dysuria.            On review of systems, the patient reports that she is doing ok. She has met with counseling and reports her anxiety about her diagnosis is stable. She reports improvement in her lymphedema and is no longer using compression garments. She reports soreness, itching, and fusion of the labia majora in the last two weeks. She describes sharp pain in the urethra during voiding. She denies any fevers or chills. She reports no flank pain, hematuria, or malodorous urine. She reports this has been the case for 2-3 weeks. She reports she also has noted a change in the urinary stream over the same period with a change in the direction of her stream which tends to direct posteriorly. She has tried using the extra-small dilator with lubricant, but is unable to tolerate a larger dilator due to discomfort. She has not seen the pelvic floor physical therapist we referred her to yet. No other complaints are noted.   ALLERGIES:  is allergic to dilaudid [hydromorphone  hcl]; benadryl [diphenhydramine]; melatonin; and penicillins.  Meds: Current Outpatient Prescriptions  Medication Sig Dispense Refill  . gabapentin (NEURONTIN) 100 MG capsule TAKE 1 CAPSULE THREE TIMES DAILY 270 capsule 3  . loperamide (IMODIUM) 2 MG capsule Take 1 capsule (2 mg total) by mouth 4 (four) times daily as needed for diarrhea or loose stools. 12 capsule 0  . LORazepam (ATIVAN) 0.5 MG tablet Take 0.5 mg by mouth every 4 (four) hours as needed for anxiety. Take 1 tab oral every 4-6 hours prn for anxiety or nausea    . meloxicam (MOBIC) 15 MG tablet TAKE 1 TABLET EVERY DAY 90 tablet 0  . Oxycodone HCl 10 MG TABS Take 1 tablet (10 mg total) by mouth every 4 (four) hours as needed. 120 tablet 0  . potassium chloride SA (K-DUR,KLOR-CON) 20 MEQ tablet Take 1 tablet (20 mEq total) by mouth daily. 30 tablet 0  . silver sulfADIAZINE (SILVADENE) 1 % cream Apply 1 application topically 2 (two) times daily. Apply to areas of skin affected, mush wash off before re-applying    . silver sulfADIAZINE (SILVADENE) 1 % cream APPLY 1 APPLICATION TOPICALLY 2 (TWO) TIMES DAILY. 400 g 0  . estradiol (ESTRACE VAGINAL) 0.1 MG/GM vaginal cream Place 0.5 g vaginally at bedtime. Apply 1/2 Gram cream to skin qhs x 2 weeks, then twice weekly thereafter 42.5 g 12  . ondansetron (ZOFRAN) 4 MG tablet Take 1 tablet (4 mg total) by mouth every 8 (eight) hours as needed for nausea or vomiting. (  Patient not taking: Reported on 01/05/2017) 20 tablet 0  . phenazopyridine (PYRIDIUM) 200 MG tablet Take 1 tablet (200 mg total) by mouth 3 (three) times daily as needed for pain. (Patient not taking: Reported on 12/27/2016) 40 tablet 1  . prochlorperazine (COMPAZINE) 10 MG tablet Take 1 tablet (10 mg total) by mouth every 6 (six) hours as needed for nausea or vomiting. (Patient not taking: Reported on 01/16/2017) 40 tablet 0  . tiZANidine (ZANAFLEX) 4 MG tablet TAKE 1 TABLET EVERY 8 HOURS AS NEEDED FOR MUSCLE SPASM(S) (Patient not  taking: Reported on 02/14/2017) 90 tablet 5   No current facility-administered medications for this encounter.     Physical Findings:  height is _0  (1.575 m) and weight is 177 lb (80.3 kg). Her oral temperature is 97.9 F (36.6 C). Her blood pressure is 138/84 and her pulse is 80. Her respiration is 18 and oxygen saturation is 95%.  Pain Assessment Pain Score: 2  (Vagina)/10 In general this is a well appearing caucasian female in no acute distress. She's alert and oriented x4 and appropriate throughout the examination. Cardiopulmonary assessment is negative for acute distress and she exhibits normal effort. Pelvic exam reveals normal appearing tissues of the labial, perineum, and perianal region. Since her last visit the labia majora has fused with only about 2 cm of visible introitus. The patient is still quite tender upon palpation and the urethra cannot be visualized. No visible tumor is noted and no evidence of desquamation is present. No palpable adenopathy is noted in the groins bilaterally.   Lab Findings: Lab Results  Component Value Date   WBC 4.3 12/27/2016   HGB 10.3 (L) 12/27/2016   HCT 31.1 (L) 12/27/2016   MCV 82.3 12/27/2016   PLT 158 12/27/2016     Radiographic Findings: No results found.  Impression/Plan: 1. 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 From a cancer perspective she's doing well and her clinical exam reveals no evidence of disease. She will see Dr. Benay Spice for her first surveillance appointment in June. She will see Dr. Quentin Cornwall at [facility] and will be due for Anoscopy in September.  I will continue to plan to see her around December of this year.  2. Pain secondary to treatment side effects from #1. Her discomfort appears to be most consistent with atrophy and scarring from radiotherapy. She will be re-referred for evauation with physical therapy as well as prescribed estrace for her atrophy. If she does not note improvement in her symptoms,  we will refer her to GYN Oncology. 3. 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 The patient will continue to meet with social work for counseling. 4. Bilateral lower extremity lymphedema. The patient will continue compression and follow up with PT as needed.      Carola Rhine, PAC

## 2017-02-15 ENCOUNTER — Telehealth: Payer: Self-pay | Admitting: *Deleted

## 2017-02-15 NOTE — Telephone Encounter (Signed)
Pt has been sch for June 1

## 2017-02-15 NOTE — Telephone Encounter (Signed)
Patient had called back and prefers premarin due to cost issues, called Adonis Brook at [facility]  Per Shona Simpson PA changed to premarin 0.625  1/2gm apply to vaginal cream to skin qhs x 2 weeks then bid  Weekly refill 12  Tubes, thanked Adonis Brook, she will call the patient when ready 1:55 PM

## 2017-02-15 NOTE — Addendum Note (Signed)
Encounter addended by: Doreen Beam, RN on: 02/15/2017  1:57 PM<BR>    Actions taken: Order Reconciliation Section accessed, Home Medications modified

## 2017-02-15 NOTE — Telephone Encounter (Signed)
Called patient left vm, called pharmacy first spoke with christy at [facility] [phone], patient has been left automatic vm that rx $137.00 for estrace cream, medicare won't pay for that, but id she gets Premarin cream cost is $47.00, asked that patient call me back and will let Shona Simpson know if she wants premarin we need dosa and directions 1:37 PM

## 2017-02-16 ENCOUNTER — Encounter: Payer: Self-pay | Admitting: *Deleted

## 2017-02-16 NOTE — Progress Notes (Signed)
50 Spoke with Ashley Moore and let her know that she has a UTI and Bactrim DS 10 tablets BID x 5 days has been called in to her pharmacy and she can take OTC AZO for comfort.  She was asked to check with her pharmacy to make sure the medication is ready for pick up before going to the pick up the medication.  She did not have any questions.

## 2017-02-17 ENCOUNTER — Telehealth: Payer: Self-pay | Admitting: *Deleted

## 2017-02-17 ENCOUNTER — Encounter: Payer: Self-pay | Admitting: Internal Medicine

## 2017-02-17 ENCOUNTER — Ambulatory Visit (INDEPENDENT_AMBULATORY_CARE_PROVIDER_SITE_OTHER): Payer: Medicare HMO | Admitting: Internal Medicine

## 2017-02-17 ENCOUNTER — Other Ambulatory Visit: Payer: Self-pay | Admitting: Radiation Oncology

## 2017-02-17 VITALS — BP 102/80 | HR 100 | Temp 97.8°F | Ht 62.0 in | Wt 178.8 lb

## 2017-02-17 DIAGNOSIS — Z634 Disappearance and death of family member: Secondary | ICD-10-CM | POA: Diagnosis not present

## 2017-02-17 DIAGNOSIS — F4321 Adjustment disorder with depressed mood: Secondary | ICD-10-CM

## 2017-02-17 DIAGNOSIS — C210 Malignant neoplasm of anus, unspecified: Secondary | ICD-10-CM | POA: Diagnosis not present

## 2017-02-17 DIAGNOSIS — F4323 Adjustment disorder with mixed anxiety and depressed mood: Secondary | ICD-10-CM

## 2017-02-17 DIAGNOSIS — F4329 Adjustment disorder with other symptoms: Secondary | ICD-10-CM | POA: Diagnosis not present

## 2017-02-17 LAB — URINE CULTURE

## 2017-02-17 MED ORDER — SERTRALINE HCL 50 MG PO TABS
ORAL_TABLET | ORAL | 2 refills | Status: DC
Start: 2017-02-17 — End: 2017-03-25

## 2017-02-17 MED ORDER — NITROFURANTOIN MONOHYD MACRO 100 MG PO CAPS: 100.0000 mg | ORAL_CAPSULE | Freq: Two times a day (BID) | ORAL | 0 refills | Status: DC

## 2017-02-17 NOTE — Telephone Encounter (Signed)
Called the patient, Dr. Lisbeth Renshaw has called in antibiotic at her pharmacy ,apologized for her not getting sooner, asked that she call me back by 5pm if [facility] hasn't called her to pick up rx, patient thanked this Rn and will c all back by 5,  3:17 PM

## 2017-02-17 NOTE — Telephone Encounter (Signed)
Patient called and left voicemail message stating,"I saw Ashley Moore two days ago and she told me I had a UTI. There are no prescriptions to pick up at [facility]. My return number is [phone].

## 2017-02-17 NOTE — Telephone Encounter (Signed)
Called patient to inform of PT appt. For 03-01-17 - arrival time - 2:30 pm with Ashley Moore, spoke with patient and she is aware of this appt.

## 2017-02-17 NOTE — Telephone Encounter (Signed)
Called [facility] [phone] spoke with [facility] [place], Pharmacy tech, they did get RX macrobid sent to them e-script by Dr. Lisbeth Renshaw, they will call her when ready, thanked Heidi 3:58 PM

## 2017-02-17 NOTE — Patient Instructions (Signed)
Begin medicine as we discussed. Continue with counseling. Cynicism message in the meantime otherwise follow-up visit in about 4- weeks.    Living With Anxiety After being diagnosed with an anxiety disorder, you may be relieved to know why you have felt or behaved a certain way. It is natural to also feel overwhelmed about the treatment ahead and what it will mean for your life. With care and support, you can manage this condition and recover from it. How to cope with anxiety Dealing with stress Stress is your body's reaction to life changes and events, both good and bad. Stress can last just a few hours or it can be ongoing. Stress can play a major role in anxiety, so it is important to learn both how to cope with stress and how to think about it differently. Talk with your health care provider or a counselor to learn more about stress reduction. He or she may suggest some stress reduction techniques, such as:  Music therapy. This can include creating or listening to music that you enjoy and that inspires you.  Mindfulness-based meditation. This involves being aware of your normal breaths, rather than trying to control your breathing. It can be done while sitting or walking.  Centering prayer. This is a kind of meditation that involves focusing on a word, phrase, or sacred image that is meaningful to you and that brings you peace.  Deep breathing. To do this, expand your stomach and inhale slowly through your nose. Hold your breath for 3-5 seconds. Then exhale slowly, allowing your stomach muscles to relax.  Self-talk. This is a skill where you identify thought patterns that lead to anxiety reactions and correct those thoughts.  Muscle relaxation. This involves tensing muscles then relaxing them.  Choose a stress reduction technique that fits your lifestyle and personality. Stress reduction techniques take time and practice. Set aside 5-15 minutes a day to do them. Therapists can offer  training in these techniques. The training may be covered by some insurance plans. Other things you can do to manage stress include:  Keeping a stress diary. This can help you learn what triggers your stress and ways to control your response.  Thinking about how you respond to certain situations. You may not be able to control everything, but you can control your reaction.  Making time for activities that help you relax, and not feeling guilty about spending your time in this way.  Therapy combined with coping and stress-reduction skills provides the best chance for successful treatment. Medicines Medicines can help ease symptoms. Medicines for anxiety include:  Anti-anxiety drugs.  Antidepressants.  Beta-blockers.  Medicines may be used as the main treatment for anxiety disorder, along with therapy, or if other treatments are not working. Medicines should be prescribed by a health care provider. Relationships Relationships can play a big part in helping you recover. Try to spend more time connecting with trusted friends and family members. Consider going to couples counseling, taking family education classes, or going to family therapy. Therapy can help you and others better understand the condition. How to recognize changes in your condition Everyone has a different response to treatment for anxiety. Recovery from anxiety happens when symptoms decrease and stop interfering with your daily activities at home or work. This may mean that you will start to:  Have better concentration and focus.  Sleep better.  Be less irritable.  Have more energy.  Have improved memory.  It is important to recognize when your condition is  getting worse. Contact your health care provider if your symptoms interfere with home or work and you do not feel like your condition is improving. Where to find help and support: You can get help and support from these sources:  Self-help groups.  Online and  [employer].  A trusted spiritual leader.  Couples counseling.  Family education classes.  Family therapy.  Follow these instructions at home:  Eat a healthy diet that includes plenty of vegetables, fruits, whole grains, low-fat dairy products, and lean protein. Do not eat a lot of foods that are high in solid fats, added sugars, or salt.  Exercise. Most adults should do the following: ? Exercise for at least 150 minutes each week. The exercise should increase your heart rate and make you sweat (moderate-intensity exercise). ? Strengthening exercises at least twice a week.  Cut down on caffeine, tobacco, alcohol, and other potentially harmful substances.  Get the right amount and quality of sleep. Most adults need 7-9 hours of sleep each night.  Make choices that simplify your life.  Take over-the-counter and prescription medicines only as told by your health care provider.  Avoid caffeine, alcohol, and certain over-the-counter cold medicines. These may make you feel worse. Ask your pharmacist which medicines to avoid.  Keep all follow-up visits as told by your health care provider. This is important. Questions to ask your health care provider  Would I benefit from therapy?  How often should I follow up with a health care provider?  How long do I need to take medicine?  Are there any long-term side effects of my medicine?  Are there any alternatives to taking medicine? Contact a health care provider if:  You have a hard time staying focused or finishing daily tasks.  You spend many hours a day feeling worried about everyday life.  You become exhausted by worry.  You start to have headaches, feel tense, or have nausea.  You urinate more than normal.  You have diarrhea. Get help right away if:  You have a racing heart and shortness of breath.  You have thoughts of hurting yourself or others. If you ever feel like you may hurt yourself or others, or  have thoughts about taking your own life, get help right away. You can go to your nearest emergency department or call:  Your local emergency services (911 in the [place]  A suicide crisis helpline, such as the [facility] at 8200698740. This is open 24-hours a day.  Summary  Taking steps to deal with stress can help calm you.  Medicines cannot cure anxiety disorders, but they can help ease symptoms.  Family, friends, and partners can play a big part in helping you recover from an anxiety disorder. This information is not intended to replace advice given to you by your health care provider. Make sure you discuss any questions you have with your health care provider. Document Released: 08/30/2016 Document Revised: 08/30/2016 Document Reviewed: 08/30/2016 Elsevier Interactive Patient Education  2018 [place] With Depression Everyone experiences occasional disappointment, sadness, and loss in their lives. When you are feeling down, blue, or sad for at least 2 weeks in a row, it may mean that you have depression. Depression can affect your thoughts and feelings, relationships, daily activities, and physical health. It is caused by changes in the way your brain functions. If you receive a diagnosis of depression, your health care provider will tell you which type of depression you have and what  treatment options are available to you. If you are living with depression, there are ways to help you recover from it and also ways to prevent it from coming back. How to cope with lifestyle changes Coping with stress Stress is your body's reaction to life changes and events, both good and bad. Stressful situations may include:  Getting married.  The death of a spouse.  Losing a job.  Retiring.  Having a baby.  Stress can last just a few hours or it can be ongoing. Stress can play a major role in depression, so it is important to learn both how to cope  with stress and how to think about it differently. Talk with your health care provider or a counselor if you would like to learn more about stress reduction. He or she may suggest some stress reduction techniques, such as:  Music therapy. This can include creating music or listening to music. Choose music that you enjoy and that inspires you.  Mindfulness-based meditation. This kind of meditation can be done while sitting or walking. It involves being aware of your normal breaths, rather than trying to control your breathing.  Centering prayer. This is a kind of meditation that involves focusing on a spiritual word or phrase. Choose a word, phrase, or sacred image that is meaningful to you and that brings you peace.  Deep breathing. To do this, expand your stomach and inhale slowly through your nose. Hold your breath for 3-5 seconds, then exhale slowly, allowing your stomach muscles to relax.  Muscle relaxation. This involves intentionally tensing muscles then relaxing them.  Choose a stress reduction technique that fits your lifestyle and personality. Stress reduction techniques take time and practice to develop. Set aside 5-15 minutes a day to do them. Therapists can offer training in these techniques. The training may be covered by some insurance plans. Other things you can do to manage stress include:  Keeping a stress diary. This can help you learn what triggers your stress and ways to control your response.  Understanding what your limits are and saying no to requests or events that lead to a schedule that is too full.  Thinking about how you respond to certain situations. You may not be able to control everything, but you can control how you react.  Adding humor to your life by watching funny films or TV shows.  Making time for activities that help you relax and not feeling guilty about spending your time this way.  Medicines Your health care provider may suggest certain medicines if  he or she feels that they will help improve your condition. Avoid using alcohol and other substances that may prevent your medicines from working properly (may interact). It is also important to:  Talk with your pharmacist or health care provider about all the medicines that you take, their possible side effects, and what medicines are safe to take together.  Make it your goal to take part in all treatment decisions (shared decision-making). This includes giving input on the side effects of medicines. It is best if shared decision-making with your health care provider is part of your total treatment plan.  If your health care provider prescribes a medicine, you may not notice the full benefits of it for 4-8 weeks. Most people who are treated for depression need to be on medicine for at least 6-12 months after they feel better. If you are taking medicines as part of your treatment, do not stop taking medicines without first talking  to your health care provider. You may need to have the medicine slowly decreased (tapered) over time to decrease the risk of harmful side effects. Relationships Your health care provider may suggest family therapy along with individual therapy and drug therapy. While there may not be family problems that are causing you to feel depressed, it is still important to make sure your family learns as much as they can about your mental health. Having your family's support can help make your treatment successful. How to recognize changes in your condition Everyone has a different response to treatment for depression. Recovery from major depression happens when you have not had signs of major depression for two months. This may mean that you will start to:  Have more interest in doing activities.  Feel less hopeless than you did 2 months ago.  Have more energy.  Overeat less often, or have better or improving appetite.  Have better concentration.  Your health care provider  will work with you to decide the next steps in your recovery. It is also important to recognize when your condition is getting worse. Watch for these signs:  Having fatigue or low energy.  Eating too much or too little.  Sleeping too much or too little.  Feeling restless, agitated, or hopeless.  Having trouble concentrating or making decisions.  Having unexplained physical complaints.  Feeling irritable, angry, or aggressive.  Get help as soon as you or your family members notice these symptoms coming back. How to get support and help from others How to talk with friends and family members about your condition Talking to friends and family members about your condition can provide you with one way to get support and guidance. Reach out to trusted friends or family members, explain your symptoms to them, and let them know that you are working with a health care provider to treat your depression. Financial resources Not all insurance plans cover mental health care, so it is important to check with your insurance carrier. If paying for co-pays or counseling services is a problem, search for a local or county mental health care center. They may be able to offer public mental health care services at low or no cost when you are not able to see a private health care provider. If you are taking medicine for depression, you may be able to get the generic form, which may be less expensive. Some makers of prescription medicines also offer help to patients who cannot afford the medicines they need. Follow these instructions at home:  Get the right amount and quality of sleep.  Cut down on using caffeine, tobacco, alcohol, and other potentially harmful substances.  Try to exercise, such as walking or lifting small weights.  Take over-the-counter and prescription medicines only as told by your health care provider.  Eat a healthy diet that includes plenty of vegetables, fruits, whole grains,  low-fat dairy products, and lean protein. Do not eat a lot of foods that are high in solid fats, added sugars, or salt.  Keep all follow-up visits as told by your health care provider. This is important. Contact a health care provider if:  You stop taking your antidepressant medicines, and you have any of these symptoms: ? Nausea. ? Headache. ? Feeling lightheaded. ? Chills and body aches. ? Not being able to sleep (insomnia).  You or your friends and family think your depression is getting worse. Get help right away if:  You have thoughts of hurting yourself or others. If  you ever feel like you may hurt yourself or others, or have thoughts about taking your own life, get help right away. You can go to your nearest emergency department or call:  Your local emergency services (911 in the [place]  A suicide crisis helpline, such as the [place] at (470)343-0446. This is open 24-hours a day.  Summary  If you are living with depression, there are ways to help you recover from it and also ways to prevent it from coming back.  Work with your health care team to create a management plan that includes counseling, stress management techniques, and healthy lifestyle habits. This information is not intended to replace advice given to you by your health care provider. Make sure you discuss any questions you have with your health care provider. Document Released: 08/08/2016 Document Revised: 08/08/2016 Document Reviewed: 08/08/2016 Elsevier Interactive Patient Education  [employer].

## 2017-02-17 NOTE — Progress Notes (Signed)
Chief Complaint  Patient presents with  . Depression    HPI: Ashley Moore 66 y.o. comes in today because of depressive symptoms and anxiety. She's crying all the time not hopeless sad. Her husband died of bad heart and lung soon after her sister of metastatic breast cancer in 8 months a good friend from a genetic heart lung disease. 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 Benay Spice her oncologist she's been given Ativan for acute anxiety but when talking with social worker felt she was depressed and needed more help and told to make appointment with her primary care doctor.  ROS: See pertinent positives and negatives per HPI. Not suicidal  Some gi issues with the  vagin al and rectal area with rx .   No tob pos MJ at night to help  And ocass ativan at night for sleep anxiety  No sig etoh  Past Medical History:  Diagnosis Date  . Bowen's disease    excised 1992  . Headache   . Hx of varicella   . 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([place])   . Thyroid cancer ([place])     Family History  Problem Relation Age of Onset  . Lung cancer Mother        lung  . Pancreatitis Father   . Breast cancer Sister   . Breast cancer Maternal Grandmother        breast   . Breast cancer Maternal Aunt        breast  . Melanoma Sister     Social History   Social History  . Marital status: Widowed    Spouse name: N/A  . Number of children: 0  . Years of education: N/A   Social History Main Topics  . Smoking status: Former Research scientist (life sciences)  . Smokeless tobacco: Former Systems developer    Quit date: 09/20/1995  . Alcohol use 0.0 oz/week     Comment: occasional  . Drug use: Yes    Types: Marijuana  . Sexual activity: No   Other Topics Concern  . None   Social History Narrative   H H  of 1      5 pets.   She is a former smoker   Retired Engineer, mining fine arts   Husband smokes  he has active heart disease and vascular disease   etoh   Red wine  1 per night.    Tea green tea and earl gray    Moved from [place] to [place] area in 01/04/1984   1 pregnancy   Husband died spring  2016 cv   Sister died 2015-04-06  Bone cancer                 Outpatient Medications Prior to Visit  Medication Sig Dispense Refill  . conjugated estrogens (PREMARIN) vaginal cream Place 1 Applicatorful vaginally at bedtime. Apply to vaginal skin Qhs x 2 weeks then bid weekly with 12 refills 02/15/17    . estradiol (ESTRACE VAGINAL) 0.1 MG/GM vaginal cream Place 0.5 g vaginally at bedtime. Apply 1/2 Gram cream to skin qhs x 2 weeks, then twice weekly thereafter 42.5 g 12  . gabapentin (NEURONTIN) 100 MG capsule TAKE 1 CAPSULE THREE TIMES DAILY 270 capsule 3  . LORazepam (ATIVAN) 0.5 MG tablet Take 0.5 mg by mouth every 4 (four) hours as needed for anxiety. Take 1 tab  oral every 4-6 hours prn for anxiety or nausea    . meloxicam (MOBIC) 15 MG tablet TAKE 1 TABLET EVERY DAY 90 tablet 0  . Oxycodone HCl 10 MG TABS Take 1 tablet (10 mg total) by mouth every 4 (four) hours as needed. 120 tablet 0  . silver sulfADIAZINE (SILVADENE) 1 % cream Apply 1 application topically 2 (two) times daily. Apply to areas of skin affected, mush wash off before re-applying    . silver sulfADIAZINE (SILVADENE) 1 % cream APPLY 1 APPLICATION TOPICALLY 2 (TWO) TIMES DAILY. 400 g 0  . tiZANidine (ZANAFLEX) 4 MG tablet TAKE 1 TABLET EVERY 8 HOURS AS NEEDED FOR MUSCLE SPASM(S) 90 tablet 5  . potassium chloride SA (K-DUR,KLOR-CON) 20 MEQ tablet Take 1 tablet (20 mEq total) by mouth daily. 30 tablet 0  . loperamide (IMODIUM) 2 MG capsule Take 1 capsule (2 mg total) by mouth 4 (four) times daily as needed for diarrhea or loose stools. (Patient not taking: Reported on 02/17/2017) 12 capsule 0  . ondansetron (ZOFRAN) 4 MG tablet Take 1 tablet (4 mg total) by mouth every 8 (eight) hours as needed for nausea or vomiting. (Patient not taking:  Reported on 02/17/2017) 20 tablet 0  . prochlorperazine (COMPAZINE) 10 MG tablet Take 1 tablet (10 mg total) by mouth every 6 (six) hours as needed for nausea or vomiting. (Patient not taking: Reported on 02/17/2017) 40 tablet 0  . phenazopyridine (PYRIDIUM) 200 MG tablet Take 1 tablet (200 mg total) by mouth 3 (three) times daily as needed for pain. (Patient not taking: Reported on 02/17/2017) 40 tablet 1   No facility-administered medications prior to visit.      EXAM:  BP 102/80 (BP Location: Left Arm, Patient Position: Sitting, Cuff Size: Normal)   Pulse 100   Temp 97.8 F (36.6 C) (Oral)   Ht 5\' 2"  (1.575 m)   Wt 178 lb 12.8 oz (81.1 kg)   BMI 32.70 kg/m   Body mass index is 32.7 kg/m.  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 Are OV in about 4 weeks or as needed. Continue with supports. Counseled multiple losses within a short period of time.  Total visit 54mins > 50% spent counseling and coordinating care as indicated in above note and in instructions to patient .      Expectant management. Medication management and can use my hcart for communication as needed  Bereavement and  Delayed grief work.   -Patient advised to return or notify health care team  if symptoms worsen ,persist or new concerns  arise.  Patient Instructions  Begin medicine as we discussed. Continue with counseling. Cynicism message in the meantime otherwise follow-up visit in about 4- weeks.    Living With Anxiety After being diagnosed with an anxiety disorder, you may be relieved to know  why you have felt or behaved a certain way. It is natural to also feel overwhelmed about the treatment ahead and what it will mean for your life. With care and support, you can manage this condition and recover from it. How to cope with anxiety Dealing with stress Stress is your body's reaction to life changes and events, both good and bad. Stress can last just a few hours or it can be ongoing. Stress can play a major role in anxiety, so it is important to learn both how to cope with stress and how to think about it differently. Talk with your health care provider or a counselor to learn more about stress reduction. He or she may suggest some stress reduction techniques, such as:  Music therapy. This can include creating or listening to music that you enjoy and that inspires you.  Mindfulness-based meditation. This involves being aware of your normal breaths, rather than trying to control your breathing. It can be done while sitting or walking.  Centering prayer. This is a kind of meditation that involves focusing on a word, phrase, or sacred image that is meaningful to you and that brings you peace.  Deep breathing. To do this, expand your stomach and inhale slowly through your nose. Hold your breath for 3-5 seconds. Then exhale slowly, allowing your stomach muscles to relax.  Self-talk. This is a skill where you identify thought patterns that lead to anxiety reactions and correct those thoughts.  Muscle relaxation. This involves tensing muscles then relaxing them.  Choose a stress reduction technique that fits your lifestyle and personality. Stress reduction techniques take time and practice. Set aside 5-15 minutes a day to do  them. Therapists can offer training in these techniques. The training may be covered by some insurance plans. Other things you can do to manage stress include:  Keeping a stress diary. This can help you learn what triggers your stress and ways to control your response.  Thinking about how you respond to certain situations. You may not be able to control everything, but you can control your reaction.  Making time for activities that help you relax, and not feeling guilty about spending your time in this way.  Therapy combined with coping and stress-reduction skills provides the best chance for successful treatment. Medicines Medicines can help ease symptoms. Medicines for anxiety include:  Anti-anxiety drugs.  Antidepressants.  Beta-blockers.  Medicines may be used as the main treatment for anxiety disorder, along with therapy, or if other treatments are not working. Medicines should be prescribed by a health care provider. Relationships Relationships can play a big part in helping you recover. Try to spend more time connecting with trusted friends and family members. Consider going to couples counseling, taking family education classes, or going to family therapy. Therapy can help you and others better understand the condition. How to recognize changes in your condition Everyone has a different response to treatment for anxiety. Recovery from anxiety happens when symptoms decrease and stop interfering with your daily activities at home or work. This may mean that you will start to:  Have better concentration and focus.  Sleep better.  Be less irritable.  Have more energy.  Have improved memory.  It is important to recognize when your condition is getting worse. Contact your health care provider if your symptoms interfere with home or work and you do not feel like your condition is improving. Where to find help and support: You can get help and support from these  sources:  Self-help groups.  Online and [employer].  A trusted spiritual leader.  Couples counseling.  Family education classes.  Family therapy.  Follow these instructions at home:  Eat a healthy diet that includes plenty of vegetables, fruits, whole grains, low-fat dairy products, and lean protein. Do not eat a lot of foods that are high in solid fats, added sugars, or salt.  Exercise. Most adults should do the following: ? Exercise for at least 150 minutes each week. The exercise should increase your heart rate and make you sweat (moderate-intensity exercise). ? Strengthening exercises at least twice a week.  Cut down on caffeine, tobacco, alcohol, and other potentially harmful substances.  Get the right amount and quality of sleep. Most adults need 7-9 hours of sleep each night.  Make choices that simplify your life.  Take over-the-counter and prescription medicines only as told by your health care provider.  Avoid caffeine, alcohol, and certain over-the-counter cold medicines. These may make you feel worse. Ask your pharmacist which medicines to avoid.  Keep all follow-up visits as told by your health care provider. This is important. Questions to ask your health care provider  Would I benefit from therapy?  How often should I follow up with a health care provider?  How long do I need to take medicine?  Are there any long-term side effects of my medicine?  Are there any alternatives to taking medicine? Contact a health care provider if:  You have a hard time staying focused or finishing daily tasks.  You spend many hours a day feeling worried about everyday life.  You become exhausted by worry.  You start to have headaches, feel tense, or have nausea.  You urinate more than normal.  You have diarrhea. Get help right away if:  You have a racing heart and shortness of breath.  You have thoughts of hurting yourself or others. If you ever  feel like you may hurt yourself or others, or have thoughts about taking your own life, get help right away. You can go to your nearest emergency department or call:  Your local emergency services (911 in the [place]  A suicide crisis helpline, such as the [facility] at 936-342-4760. This is open 24-hours a day.  Summary  Taking steps to deal with stress can help calm you.  Medicines cannot cure anxiety disorders, but they can help ease symptoms.  Family, friends, and partners can play a big part in helping you recover from an anxiety disorder. This information is not intended to replace advice given to you by your health care provider. Make sure you discuss any questions you have with your health care provider. Document Released: 08/30/2016 Document Revised: 08/30/2016 Document Reviewed: 08/30/2016 Elsevier Interactive Patient Education  2018 [place] With Depression Everyone experiences occasional disappointment, sadness, and loss in their lives. When you are feeling down, blue, or sad for at least 2 weeks in a row, it may mean that you have depression. Depression can affect your thoughts and feelings, relationships, daily activities, and physical health. It is caused by changes in the way your brain functions. If you receive a diagnosis of depression, your health care provider will tell you which type of depression you have and what treatment options are available to you. If you are living with depression, there are ways to help you recover from it and also ways to prevent it from coming back. How to cope with lifestyle changes Coping with stress Stress  is your body's reaction to life changes and events, both good and bad. Stressful situations may include:  Getting married.  The death of a spouse.  Losing a job.  Retiring.  Having a baby.  Stress can last just a few hours or it can be ongoing. Stress can play a major role in depression, so  it is important to learn both how to cope with stress and how to think about it differently. Talk with your health care provider or a counselor if you would like to learn more about stress reduction. He or she may suggest some stress reduction techniques, such as:  Music therapy. This can include creating music or listening to music. Choose music that you enjoy and that inspires you.  Mindfulness-based meditation. This kind of meditation can be done while sitting or walking. It involves being aware of your normal breaths, rather than trying to control your breathing.  Centering prayer. This is a kind of meditation that involves focusing on a spiritual word or phrase. Choose a word, phrase, or sacred image that is meaningful to you and that brings you peace.  Deep breathing. To do this, expand your stomach and inhale slowly through your nose. Hold your breath for 3-5 seconds, then exhale slowly, allowing your stomach muscles to relax.  Muscle relaxation. This involves intentionally tensing muscles then relaxing them.  Choose a stress reduction technique that fits your lifestyle and personality. Stress reduction techniques take time and practice to develop. Set aside 5-15 minutes a day to do them. Therapists can offer training in these techniques. The training may be covered by some insurance plans. Other things you can do to manage stress include:  Keeping a stress diary. This can help you learn what triggers your stress and ways to control your response.  Understanding what your limits are and saying no to requests or events that lead to a schedule that is too full.  Thinking about how you respond to certain situations. You may not be able to control everything, but you can control how you react.  Adding humor to your life by watching funny films or TV shows.  Making time for activities that help you relax and not feeling guilty about spending your time this way.  Medicines Your health care  provider may suggest certain medicines if he or she feels that they will help improve your condition. Avoid using alcohol and other substances that may prevent your medicines from working properly (may interact). It is also important to:  Talk with your pharmacist or health care provider about all the medicines that you take, their possible side effects, and what medicines are safe to take together.  Make it your goal to take part in all treatment decisions (shared decision-making). This includes giving input on the side effects of medicines. It is best if shared decision-making with your health care provider is part of your total treatment plan.  If your health care provider prescribes a medicine, you may not notice the full benefits of it for 4-8 weeks. Most people who are treated for depression need to be on medicine for at least 6-12 months after they feel better. If you are taking medicines as part of your treatment, do not stop taking medicines without first talking to your health care provider. You may need to have the medicine slowly decreased (tapered) over time to decrease the risk of harmful side effects. Relationships Your health care provider may suggest family therapy along with individual therapy and drug  therapy. While there may not be family problems that are causing you to feel depressed, it is still important to make sure your family learns as much as they can about your mental health. Having your family's support can help make your treatment successful. How to recognize changes in your condition Everyone has a different response to treatment for depression. Recovery from major depression happens when you have not had signs of major depression for two months. This may mean that you will start to:  Have more interest in doing activities.  Feel less hopeless than you did 2 months ago.  Have more energy.  Overeat less often, or have better or improving appetite.  Have better  concentration.  Your health care provider will work with you to decide the next steps in your recovery. It is also important to recognize when your condition is getting worse. Watch for these signs:  Having fatigue or low energy.  Eating too much or too little.  Sleeping too much or too little.  Feeling restless, agitated, or hopeless.  Having trouble concentrating or making decisions.  Having unexplained physical complaints.  Feeling irritable, angry, or aggressive.  Get help as soon as you or your family members notice these symptoms coming back. How to get support and help from others How to talk with friends and family members about your condition Talking to friends and family members about your condition can provide you with one way to get support and guidance. Reach out to trusted friends or family members, explain your symptoms to them, and let them know that you are working with a health care provider to treat your depression. Financial resources Not all insurance plans cover mental health care, so it is important to check with your insurance carrier. If paying for co-pays or counseling services is a problem, search for a local or county mental health care center. They may be able to offer public mental health care services at low or no cost when you are not able to see a private health care provider. If you are taking medicine for depression, you may be able to get the generic form, which may be less expensive. Some makers of prescription medicines also offer help to patients who cannot afford the medicines they need. Follow these instructions at home:  Get the right amount and quality of sleep.  Cut down on using caffeine, tobacco, alcohol, and other potentially harmful substances.  Try to exercise, such as walking or lifting small weights.  Take over-the-counter and prescription medicines only as told by your health care provider.  Eat a healthy diet that includes plenty  of vegetables, fruits, whole grains, low-fat dairy products, and lean protein. Do not eat a lot of foods that are high in solid fats, added sugars, or salt.  Keep all follow-up visits as told by your health care provider. This is important. Contact a health care provider if:  You stop taking your antidepressant medicines, and you have any of these symptoms: ? Nausea. ? Headache. ? Feeling lightheaded. ? Chills and body aches. ? Not being able to sleep (insomnia).  You or your friends and family think your depression is getting worse. Get help right away if:  You have thoughts of hurting yourself or others. If you ever feel like you may hurt yourself or others, or have thoughts about taking your own life, get help right away. You can go to your nearest emergency department or call:  Your local emergency services (911 in the  [place]  A suicide crisis helpline, such as the [facility] at 458-730-5513. This is open 24-hours a day.  Summary  If you are living with depression, there are ways to help you recover from it and also ways to prevent it from coming back.  Work with your health care team to create a management plan that includes counseling, stress management techniques, and healthy lifestyle habits. This information is not intended to replace advice given to you by your health care provider. Make sure you discuss any questions you have with your health care provider. Document Released: 08/08/2016 Document Revised: 08/08/2016 Document Reviewed: 08/08/2016 Elsevier Interactive Patient Education  2018 [place]. Panosh M.D.

## 2017-02-23 ENCOUNTER — Encounter: Payer: Self-pay | Admitting: *Deleted

## 2017-02-23 NOTE — Progress Notes (Signed)
[place] Work  Clinical Social Work was referred by patient for assessment of psychosocial needs and supportive counseling session. Clinical Social Worker met with patient at [facility] in [place] office for third counseling session. Pt has been working hard on goals set last session. Pt went to her PCP to have discussion about SSRI to assist with depression and was started on zoloft. Pt has already felt the impact of this medication and was less tearful today than in previous sessions. Pt made efforts to promote "self care" in her daily routine. She appears to be moving forward on her goals. CSW provided education and supportive counseling session. Pt plans to attend appointment with PT and begin to explore exercise and other outlets to slowly regain energy and decrease fatigue before next session. CSW and pt will meet again on 03/15/17  Clinical Social Work interventions:  Supportive counseling session  Loren Racer, LCSW, OSW-C Clinical Social Worker [place]  [place] Phone: [phone] Fax: [phone]

## 2017-02-27 ENCOUNTER — Other Ambulatory Visit: Payer: Self-pay | Admitting: *Deleted

## 2017-02-27 ENCOUNTER — Telehealth: Payer: Self-pay | Admitting: *Deleted

## 2017-02-27 NOTE — Telephone Encounter (Signed)
Returned call to pt, she reports she is passing mucous per rectum. She notices this more when she eats raw vegetables. Informed her that passing mucous is expected. She also reports diarrhea. Pt recently began a new diet "90% vegetables 10% protein". Recommended she incorporate rice or potatoes to promote firmer stools. She agreed to try this.  Pt reports she is scheduled to see a surgeon at [facility] wonders if they will put her to "sleep" for rectal exam. Encouraged her to call that office with questions.

## 2017-03-01 ENCOUNTER — Telehealth: Payer: Self-pay | Admitting: *Deleted

## 2017-03-01 ENCOUNTER — Ambulatory Visit: Payer: Medicare HMO | Attending: Radiation Oncology | Admitting: Physical Therapy

## 2017-03-01 ENCOUNTER — Encounter: Payer: Self-pay | Admitting: Physical Therapy

## 2017-03-01 DIAGNOSIS — R252 Cramp and spasm: Secondary | ICD-10-CM

## 2017-03-01 DIAGNOSIS — M6281 Muscle weakness (generalized): Secondary | ICD-10-CM | POA: Diagnosis not present

## 2017-03-01 DIAGNOSIS — Z483 Aftercare following surgery for neoplasm: Secondary | ICD-10-CM

## 2017-03-01 DIAGNOSIS — R279 Unspecified lack of coordination: Secondary | ICD-10-CM | POA: Insufficient documentation

## 2017-03-01 NOTE — Therapy (Signed)
[facility] [address], [place], [place], [address] Phone: [phone]   Fax:  [phone]  Physical Therapy Evaluation  Patient Details  Name: Ashley Moore MRN: [id number] Date of Birth: Apr 24, 1951 Referring Provider: Hayden Pedro  Encounter Date: 03/01/2017      PT End of Session - 03/01/17 1626    Visit Number 1   Date for PT Re-Evaluation 04/26/17   PT Start Time 1500  came 15 min late   PT Stop Time 1530   PT Time Calculation (min) 30 min   Activity Tolerance Patient tolerated treatment well   Behavior During Therapy [facility] for tasks assessed/performed      Past Medical History:  Diagnosis Date  . Bowen's disease    excised 1992  . Headache   . Hx of varicella   . 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([place])   . Thyroid cancer [facility])     Past Surgical History:  Procedure Laterality Date  . BUNIONECTOMY Right   . IR GENERIC HISTORICAL  11/14/2016   IR US GUIDE VASC ACCESS RIGHT 11/14/2016 [facility]  . IR GENERIC HISTORICAL  11/14/2016   IR FLUORO GUIDE CV LINE RIGHT 11/14/2016 [facility]  . IR GENERIC HISTORICAL  12/12/2016   IR US GUIDE VASC ACCESS RIGHT 12/12/2016 [name], MD [facility]  . IR GENERIC HISTORICAL  12/12/2016   IR FLUORO GUIDE CV LINE RIGHT 12/12/2016 [name], MD [facility]  . SKIN CANCER EXCISION     bowens disease    There were no vitals filed for this visit.       Subjective Assessment - 03/01/17 1500    Subjective Patient had chemotherapy and radiation 10/20/2016 to 11/26/2016 for 6 weeks. Patient vaginal canal has shrunk from not having intercourse and radiation. When urinates the stream goes everywhere and sprays her legs. Has to wash up after urinating. Patient reports most days does not have control with bowel movements.  Leaks stools 2 times per day. No urine leakage. Patient wears a big pad for the stool leakage.    Pertinent History Diagnosed with anal cancer 6 weeks  ago but reports being misdiagnosed by 3 doctors over the past year as having hemorrhoids. They began chemo and radiation simultaneously about a month ago. 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   Patient Stated Goals improve fecal leakage, improve the vaginal opening to improve urine stream   Currently in Pain? 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  No   Is the patient  reluctant to leave their home because of a fear of falling?  No     Home Ecologist residence     Prior Function   Level of Independence Independent   Vocation Full time employment   Vocation Requirements standing     Cognition   Overall Cognitive Status Within Functional Limits for tasks assessed     Observation/Other Assessments   Focus on Therapeutic Outcomes (FOTO)  67% limitation     Posture/Postural Control   Posture/Postural Control No significant limitations     ROM / Strength   AROM / PROM / Strength AROM;Strength     AROM   Overall AROM Comments Lumbar ROM is full     Strength   Overall Strength Comments bil. hip abduction 4/5; abdominal strength is 2/5            Objective measurements completed on  examination: See above findings.        Pelvic Floor Special Questions - 03/01/17 0001    Prior Pregnancies No   Currently Sexually Active No   Urinary Leakage No   Fecal incontinence Yes  wears a homemade pad   External Perineal Exam upper portion of the labia minora is fuse together; the clitoris is present and not covered;    Perineal Body/Introitus  Other   Perineal Body/Introitus other tight and tender   Pelvic Floor Internal Exam Patient confirms identification and approves PT to assess the perineal and muscle integrity   Exam Type Vaginal   Sensation very tender; only able to palpate the introitus   Strength --  unable to test due to pain                  PT Education - 03/01/17 1625    Education provided Yes   Education Details instructed patient on how to use movement with the dilator and with hips; gave patient samples of lubricants to use with dilator   Person(s) Educated Patient   Methods Explanation   Comprehension Verbalized understanding          PT Short Term Goals - 03/01/17 1635      PT SHORT TERM GOAL #1   Title independent with initial HEP   Time 4   Period Weeks   Status New     PT SHORT TERM GOAL #2   Title understand on how to use the dilator with movement and how to progress   Time 4   Period Weeks   Status New     PT SHORT TERM GOAL #3   Title understand how to do self perineal massage to separate the labia minora   Time 4   Period Weeks   Status New     PT SHORT TERM GOAL #4   Title fecal leakage decreased >/= 25% due to improved pelvic floor muscle control   Time 4   Period Weeks   Status New           PT Long Term Goals - 03/01/17 1639      PT LONG TERM GOAL #1   Title independent with HEP   Time 8   Period Weeks   Status New     PT LONG TERM GOAL #2   Title ability to use the largest size dilator and understand she will have to use it for 2 years   Time 8   Period Weeks   Status New     PT LONG TERM  GOAL #3   Title urine stream able to go straight and not have to clean herself due to spraying of urine   Time 8   Period Weeks   Status New     PT LONG TERM GOAL #4   Title fecal incontinence decreased >/= 75% due to increase control of pelvic floor muscles   Time 8   Period Weeks   Status New     PT LONG TERM GOAL #5   Title sit for 45 mintues due to skin around the perineal and rectal area is healed from the radiation   Time 8   Period Weeks   Status New     Additional Long Term Goals   Additional Long Term Goals Yes     PT LONG TERM GOAL #6   Title FOTO score is </= 50% limitation   Time 8   Period Weeks   Status New           Long Term Clinic Goals - 01/31/17 1220      CC Long Term Goal  #1   Title Patient will verbalize understanding of lymphedema including treatment and maintenance.   Time 4   Period Weeks   Status Achieved     CC Long Term Goal  #2   Title Reduce bilateral legs to </= 22 cm at 5 cm proximal to 5th MTP.   Baseline 22.5 and 22.9 on L and R on 01/31/17   Time 4   Period Weeks   Status Partially Met     CC Long Term Goal  #3   Title Reduce bilateral legs to </= 35 cm at 20 cm proximal to floor at lateral left leg.   Baseline 33.5 on 01/31/17   Time 4   Period Weeks   Status Achieved     CC Long Term Goal  #4   Title Patient will verbalize where and how to be fitted for compression garments for BLE.   Baseline pt has been fitted and received garments -01/31/17   Time 4   Period Weeks   Status Achieved             Plan - 03/01/17 1627    Clinical Impression Statement Patient is a 66 year old female with diagnosis of anal and vulva cancer.  Patient has had chemotherapy and radiation concurrently from 10/20/2016 to 11/26/2016.  Patient has had blistering around the vagina , anus and inner thigh.  Patient is using nitrate cream as a barrier. Patient has discomfort at level 2/10 on the rectal area from sitting.  Patient leaks stool 2 times  per day and wears a homemade pad.  Patient stream of urine goes all over her thighs and she has to wash herself.  Patient labia minora is fused superiorly leaving an opening where the clitoris is.  Patient is unable to have therapist index finger placed past the introitus due to pain.  Patient is not able to use the second dilator. Patient has weakness in her hips and abdomen.  Patient will benefit from skilled therapy to improve pelvic floor function, assist in using dilators and reduce fusion of the labia minora.    History and Personal Factors relevant to plan of care: rectal cancer; vulva cancer; thyroid cancer; had radiation and chemotherapy concurrent; lymphedema in her legs.    Clinical Presentation Evolving   Clinical Presentation due to: sue to skin changes from the radiation and chemotherapy   Clinical Decision Making Moderate  Rehab Potential Good   Clinical Impairments Affecting Rehab Potential Extent of disease   PT Frequency 2x / week   PT Duration 8 weeks  4 months   PT Treatment/Interventions Biofeedback;Therapeutic activities;Therapeutic exercise;Neuromuscular re-education;Patient/family education;Passive range of motion;Scar mobilization;Manual techniques;Energy conservation   PT Next Visit Plan instruction on using dilator, hip stretches, soft tissue work to labia, relaxation exercises to the pelvic floor   PT Home Exercise Plan progress as needed   Consulted and Agree with Plan of Care Patient      Patient will benefit from skilled therapeutic intervention in order to improve the following deficits and impairments:  Decreased coordination, Increased fascial restricitons, Decreased endurance, Increased muscle spasms, Decreased activity tolerance, Impaired flexibility, Decreased strength, Decreased mobility, Decreased scar mobility, Decreased skin integrity  Visit Diagnosis: Muscle weakness (generalized) - Plan: PT plan of care cert/re-cert  Unspecified lack of coordination -  Plan: PT plan of care cert/re-cert  Cramp and spasm - Plan: PT plan of care cert/re-cert  Aftercare following surgery for neoplasm - Plan: PT plan of care cert/re-cert      G-Codes - 33/74/45 1644    Functional Assessment Tool Used (Outpatient Only) FOTO score is 67% limitation  goal is 50% limitation   Functional Limitation Other PT primary   Other PT Primary Current Status (H4604) At least 60 percent but less than 80 percent impaired, limited or restricted   Other PT Primary Goal Status (N9987) At least 40 percent but less than 60 percent impaired, limited or restricted       Problem List Patient Active Problem List   Diagnosis Date Noted  . Port catheter in place 11/21/2016  . 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 Anal cancer ([place]) 11/03/2016  . Cystitis 05/06/2015  . Degenerative cervical disc 04/08/2015  . Trigger point of neck 03/26/2015  . Pain in joint, ankle and foot 04/23/2013  . Visit for preventive health examination 02/13/2013  . Routine gynecological examination 02/13/2013  . Family history of breast cancer in first degree relative 02/13/2013  . Rash and nonspecific skin eruption 02/13/2013  . 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 VITAMIN D DEFICIENCY 06/25/2008  . NECK PAIN 06/02/2008  . FOOT PAIN 06/02/2008  . OSTEOPENIA 06/02/2008    Earlie Counts, PT 03/01/17 4:47 PM   [facility] [address], [place], [place], [address] Phone: [phone]   Fax:  [phone]  Name: Ashley Moore MRN: [id number] Date of Birth: [birth year]

## 2017-03-01 NOTE — Telephone Encounter (Signed)
Received refill request from [facility] for potassium. Discussed with Dr. Benay Spice: OK to stop potassium. Refill denied. Pt notified, she reports she is no longer taking potassium.

## 2017-03-09 ENCOUNTER — Ambulatory Visit: Payer: Medicare HMO | Admitting: Physical Therapy

## 2017-03-09 ENCOUNTER — Encounter: Payer: Self-pay | Admitting: Physical Therapy

## 2017-03-09 DIAGNOSIS — R252 Cramp and spasm: Secondary | ICD-10-CM | POA: Diagnosis not present

## 2017-03-09 DIAGNOSIS — R279 Unspecified lack of coordination: Secondary | ICD-10-CM | POA: Diagnosis not present

## 2017-03-09 DIAGNOSIS — Z483 Aftercare following surgery for neoplasm: Secondary | ICD-10-CM

## 2017-03-09 DIAGNOSIS — M6281 Muscle weakness (generalized): Secondary | ICD-10-CM

## 2017-03-09 NOTE — Patient Instructions (Addendum)
Bear Down    Exhaling, bear down as if to have a bowel movement. Repeat _5__ times. Do 3___ times a day.  Copyright  VHI. All rights reserved.  When using the dilator use the smaller one first for 1 min. Then the next size for 4 min.   [place] [address], [place], [place] [address] Phone # [phone] Fax [phone]

## 2017-03-09 NOTE — Therapy (Signed)
[facility] [address] [place], [place], [address] Phone: [phone]   Fax:  [phone]  Physical Therapy Treatment  Patient Details  Name: Ashley Moore MRN: [id number] Date of Birth: 08-Mar-1951 Referring Provider: Hayden Pedro  Encounter Date: 03/09/2017      PT End of Session - 03/09/17 1530    Visit Number 2   Number of Visits 12   Date for PT Re-Evaluation 04/26/17   PT Start Time 9169   PT Stop Time 1530   PT Time Calculation (min) 45 min   Activity Tolerance Patient tolerated treatment well   Behavior During Therapy [facility] for tasks assessed/performed      Past Medical History:  Diagnosis Date  . Bowen's disease    excised 1992  . Headache   . Hx of varicella   . 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([place])   . Thyroid cancer [facility])     Past Surgical History:  Procedure Laterality Date  . BUNIONECTOMY Right   . IR GENERIC HISTORICAL  11/14/2016   IR US GUIDE VASC ACCESS RIGHT 11/14/2016 [facility]  . IR GENERIC HISTORICAL  11/14/2016   IR FLUORO GUIDE CV LINE RIGHT 11/14/2016 [facility]  . IR GENERIC HISTORICAL  12/12/2016   IR US GUIDE VASC ACCESS RIGHT 12/12/2016 [name], MD [facility]  . IR GENERIC HISTORICAL  12/12/2016   IR FLUORO GUIDE CV LINE RIGHT 12/12/2016 [name], MD [facility]  . SKIN CANCER EXCISION     bowens disease    There were no vitals filed for this visit.      Subjective Assessment - 03/09/17 1455    Subjective I drank lemonade and ate green beans. I had diahrrhea and burned my skin.  I have not been able to sit comfortably or walk for a week.    Pertinent History Diagnosed with anal cancer 6 weeks ago but reports being misdiagnosed by 3 doctors over the past year as having hemorrhoids. They began chemo and radiation simultaneously about a month ago. 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   How long can you sit comfortably? Few minutes   Patient Stated Goals  improve fecal leakage, improve the vaginal opening to improve urine stream   Currently in Pain? Yes                      Pelvic Floor Special Questions - 03/09/17 0001    Pelvic Floor Internal Exam Patient confirms identification and approves PT to assess the perineal and muscle integrity   Exam Type Vaginal           [facility] Adult PT Treatment/Exercise - 03/09/17 0001      Self-Care   Self-Care Other Self-Care Comments   Other Self-Care Comments  instructed patient on where to purchase more dilators with different sizes, how to use the dilator and move onto the next size; discussed with patient on fresh vegetables and how to bowel react to the food.      Manual Therapy   Manual Therapy Soft tissue mobilization;Internal Pelvic Floor                PT Education - 03/09/17 1528    Education provided Yes   Education Details how to use the dilator and bulging of pelvic floor, soft tissue work to labia   Person(s) Educated Patient   Methods Explanation;Demonstration;Verbal cues;Handout   Comprehension Verbalized understanding;Returned demonstration  PT Short Term Goals - 03/09/17 1533      PT SHORT TERM GOAL #1   Title independent with initial HEP   Time 4   Period Weeks   Status On-going     PT SHORT TERM GOAL #2   Title understand on how to use the dilator with movement and how to progress   Time 4   Period Weeks   Status On-going     PT SHORT TERM GOAL #3   Title understand how to do self perineal massage to separate the labia minora   Time 4   Period Weeks   Status On-going     PT SHORT TERM GOAL #4   Title fecal leakage decreased >/= 25% due to improved pelvic floor muscle control   Time 4   Period Weeks   Status On-going           PT Long Term Goals - 03/01/17 1639      PT LONG TERM GOAL #1   Title independent with HEP   Time 8   Period Weeks   Status New     PT LONG TERM GOAL #2   Title ability to use the largest  size dilator and understand she will have to use it for 2 years   Time 8   Period Weeks   Status New     PT LONG TERM GOAL #3   Title urine stream able to go straight and not have to clean herself due to spraying of urine   Time 8   Period Weeks   Status New     PT LONG TERM GOAL #4   Title fecal incontinence decreased >/= 75% due to increase control of pelvic floor muscles   Time 8   Period Weeks   Status New     PT LONG TERM GOAL #5   Title sit for 45 mintues due to skin around the perineal and rectal area is healed from the radiation   Time 8   Period Weeks   Status New     Additional Long Term Goals   Additional Long Term Goals Yes     PT LONG TERM GOAL #6   Title FOTO score is </= 50% limitation   Time 8   Period Weeks   Status New           Long Term Clinic Goals - 01/31/17 1220      CC Long Term Goal  #1   Title Patient will verbalize understanding of lymphedema including treatment and maintenance.   Time 4   Period Weeks   Status Achieved     CC Long Term Goal  #2   Title Reduce bilateral legs to </= 22 cm at 5 cm proximal to 5th MTP.   Baseline 22.5 and 22.9 on L and R on 01/31/17   Time 4   Period Weeks   Status Partially Met     CC Long Term Goal  #3   Title Reduce bilateral legs to </= 35 cm at 20 cm proximal to floor at lateral left leg.   Baseline 33.5 on 01/31/17   Time 4   Period Weeks   Status Achieved     CC Long Term Goal  #4   Title Patient will verbalize where and how to be fitted for compression garments for BLE.   Baseline pt has been fitted and received garments -01/31/17   Time 4   Period Weeks   Status  Achieved            Plan - 03/09/17 1530    Clinical Impression Statement Patient was able to tolerate internal soft tissue work with 2/10 pain and felt better afterwards.  Patient understands how to move up to the next size dilator.  Patient labia is still fused but understand how to keep apart as much as possible. Patient  will benefit from skilled therapy to improve pelvic floor function, assist in using a dilators and reduce fusion of the labia minora.    Rehab Potential Good   Clinical Impairments Affecting Rehab Potential Extent of disease   PT Frequency 2x / week   PT Duration 8 weeks   PT Treatment/Interventions Biofeedback;Therapeutic activities;Therapeutic exercise;Neuromuscular re-education;Patient/family education;Passive range of motion;Scar mobilization;Manual techniques;Energy conservation   PT Next Visit Plan  hip stretches, soft tissue work to labia and internal, relaxation exercises to the pelvic floor   PT Home Exercise Plan progress as needed   Recommended Other Services cert signed 12/27/7351   Consulted and Agree with Plan of Care Patient      Patient will benefit from skilled therapeutic intervention in order to improve the following deficits and impairments:  Decreased coordination, Increased fascial restricitons, Decreased endurance, Increased muscle spasms, Decreased activity tolerance, Impaired flexibility, Decreased strength, Decreased mobility, Decreased scar mobility, Decreased skin integrity  Visit Diagnosis: Muscle weakness (generalized)  Unspecified lack of coordination  Cramp and spasm  Aftercare following surgery for neoplasm     Problem List Patient Active Problem List   Diagnosis Date Noted  . Port catheter in place 11/21/2016  . 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 Anal cancer ([place]) 11/03/2016  . Cystitis 05/06/2015  . Degenerative cervical disc 04/08/2015  . Trigger point of neck 03/26/2015  . Pain in joint, ankle and foot 04/23/2013  . Visit for preventive health examination 02/13/2013  . Routine gynecological examination 02/13/2013  . Family history of breast cancer in first degree relative 02/13/2013  . Rash and nonspecific skin eruption 02/13/2013  . 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 VITAMIN D DEFICIENCY 06/25/2008  . NECK PAIN  06/02/2008  . FOOT PAIN 06/02/2008  . OSTEOPENIA 06/02/2008    Earlie Counts, PT 03/09/17 3:35 PM   [facility] [address], [place], [place], [address] Phone: [phone]   Fax:  [phone]  Name: Ashley Moore MRN: [id number] Date of Birth: 01-26-1951

## 2017-03-14 ENCOUNTER — Telehealth: Payer: Self-pay | Admitting: Nurse Practitioner

## 2017-03-14 ENCOUNTER — Ambulatory Visit (HOSPITAL_BASED_OUTPATIENT_CLINIC_OR_DEPARTMENT_OTHER): Payer: Medicare HMO | Admitting: Nurse Practitioner

## 2017-03-14 VITALS — BP 132/64 | HR 76 | Temp 98.2°F | Resp 18 | Ht 62.0 in | Wt 174.9 lb

## 2017-03-14 DIAGNOSIS — R197 Diarrhea, unspecified: Secondary | ICD-10-CM

## 2017-03-14 DIAGNOSIS — C210 Malignant neoplasm of anus, unspecified: Secondary | ICD-10-CM | POA: Diagnosis not present

## 2017-03-14 NOTE — Telephone Encounter (Signed)
Scheduled appt per 6/26 los - Gave patient AVS and calender per los.  

## 2017-03-14 NOTE — Progress Notes (Addendum)
[facility] OFFICE PROGRESS NOTE   Diagnosis:  Anal Cancer   INTERVAL HISTORY:   Ashley Moore returns today as scheduled. She completed cycle 2 of 5FU/mitomycin on 12/12/3016 and radiation course on 12/23/2016. Since then she has resumed work and is feeling well with a good appetite. 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 She feels rectal fullness but denies pain and is off pain medication. She has occasional, dietary-related diarrhea, especially when eating green beans, including a recent episode that caused significant diarrhea with anal burning and pain. She has regular bowel movements otherwise. She denies nausea, vomiting, constipation, anal discharge or bleeding, or abdominal pain. She notes bilateral lower extremity lymphedema that is improving and prominent leg veins that are not cosmetically appealing but not otherwise painful. No calf tenderness.   ROS General: Intentional weight loss of 4 lbs since 02/17/2017. No fever or chills.  HEENT: No mouth sores Heme/lymph: No bleeding or adenopathy. Resp: No shortness of breath. Cardio: No chest pain or palpitations.  GI: as per HPI. GU: Labia are fused together, causing spraying of urine. Working with PT on this. Uses vaginal dilator, has ordered next size larger. No vaginal discharge of bleeding. Not sexually active. Neuro: Occasional headache related to previous cervical spine fracture for which she takes Gabapentin.  Skin: Skin changes related to chemoradiation.   Objective:  Vital signs in last 24 hours:  Blood pressure 132/64, pulse 76, temperature 98.2 F (36.8 C), temperature source Oral, resp. rate 18, height 5\' 2"  (1.575 m), weight 174 lb 14.4 oz (79.3 kg), SpO2 100 %.    HEENT: Oral mucosa is pink and moist, no ulcers or thrush. Lymphatics: No cervical, supraclavicular, or inguinal lymphadenopathy. Bilateral  lower extremity lymphedema. Resp: Regular rate and effort, clear to auscultation bilaterally. No respiratory distress.  Cardio: Regular rate and rhythm. S1 and S2 present. No murmur. GI: Soft and nontender, active bowel sounds throughout. Digital rectal exam reveals area of thickening at left anal verge, no nodularity. Visibly uncomfortable during this exam. Vaginal: external exam reveals labial swelling with radiation skin changes to groin, pubic region, labia, and perineum.  Vascular: Prominent vein to right lower anterior leg, palpable but not tender. Neuro: Grossly intact.   Skin: Hyperpigmentation and radiation skin changes notes above.  Lab Results:  Lab Results  Component Value Date   WBC 4.3 12/27/2016   HGB 10.3 (L) 12/27/2016   HCT 31.1 (L) 12/27/2016   MCV 82.3 12/27/2016   PLT 158 12/27/2016   NEUTROABS 2.9 12/26/2016    Imaging:  No results found.  Medications: I have reviewed the patient's current medications.  Assessment/Plan: 1. Anal cancer ? CT abdomen/pelvis 10/21/2016-thickening of the anus extending to the junction between the anus and rectum with a large stool ball in the rectum and mild fat stranding posterior to the rectum. Enlarged lymph nodes in the right and left inguinal regions. A few mildly prominent nodesseen posterior to the rectum. ? 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 ? PET scan 81/19/[id number] hypermetabolic anal mass with hypermetabolic metastases to the groin region bilaterally, left pelvic sidewall and presacral space. ? Initiation of radiation and cycle 1 5-FU/mitomycin C 11/14/2016 ? Cycle 2 5-FU/mitomycin C 12/12/2016 (5-FU dose reduced due to mucositis, diarrhea, skin breakdown) ? Radiation completed 12/23/2016 2. Left labial lesions. Question direct extension from the anal cancer versus metastatic disease from anal cancer versus a  separate malignant process. 3. History of pain and bleeding secondary to #1  and skin breakdown 4. 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 5. Multiple family members with breast cancer. 6. History of hypokalemia-likely secondary to decreased nutritional intake and diarrhea; potassium in normal range 01/16/2017. No longer taking a potassium supplement.   Disposition: Ms. Bouie is doing well today. Her fatigue continues to improve. She has intermittent diarrhea and anal pain with certain foods but pain is otherwise controlled without medication. She is requesting a refill of silvadene cream for episodes of anal pain and burning with defecation, we will send a message to Shona Simpson, PA in radiation to advise on how long this medication should be used post radiation treatment. She will follow up in radiation in December. She continues physical therapy. She will have a CT of the abdomen and pelvis at the end of July and see Dr. Benay Spice on 04/20/2017. She will see GI surgeon, Dr. Marvel Plan, in [place] in August as well. She knows to call the clinic in the interim with any new or worsening concerns.     Alla Feeling NP student  03/14/2017  1:56 PM  Patient seen and examined with Cira Rue. I agree with the above. Ms. Hession appears stable. We are referring her for restaging CT scans in about 4 weeks. She will return for a follow-up visit on 04/20/2017 to review the results. She has an appointment with Dr. Marvel Plan, GI surgery, in August.  Ned Card, ANP/GNP-BC

## 2017-03-15 ENCOUNTER — Encounter: Payer: Self-pay | Admitting: *Deleted

## 2017-03-15 ENCOUNTER — Telehealth: Payer: Self-pay | Admitting: *Deleted

## 2017-03-15 ENCOUNTER — Ambulatory Visit: Payer: Medicare HMO | Admitting: Physical Therapy

## 2017-03-15 ENCOUNTER — Encounter: Payer: Self-pay | Admitting: Physical Therapy

## 2017-03-15 DIAGNOSIS — M6281 Muscle weakness (generalized): Secondary | ICD-10-CM | POA: Diagnosis not present

## 2017-03-15 DIAGNOSIS — R279 Unspecified lack of coordination: Secondary | ICD-10-CM | POA: Diagnosis not present

## 2017-03-15 DIAGNOSIS — Z483 Aftercare following surgery for neoplasm: Secondary | ICD-10-CM | POA: Diagnosis not present

## 2017-03-15 DIAGNOSIS — R252 Cramp and spasm: Secondary | ICD-10-CM

## 2017-03-15 NOTE — Telephone Encounter (Signed)
Called and left vm for the patient per Shona Simpson [facility], patient should be using vitamin e or coconut oil at this time, and should be using estrogen cream if any questions please call [phone] to discuss any concerns 9:11 AM

## 2017-03-15 NOTE — Patient Instructions (Addendum)
Chair Sitting    Sit at edge of seat, spine straight, one leg extended. Put a hand on each thigh and bend forward from the hip, keeping spine straight. Allow hand on extended leg to reach toward toes. Support upper body with other arm. Hold _30__ seconds. Repeat __2_ times per session. Do __1_ sessions per day.  Copyright  VHI. All rights reserved.   Piriformis Stretch, Sitting    Sit, one ankle on opposite knee, same-side hand on crossed knee. Push down on knee, keeping spine straight. Lean torso forward, with flat back, until tension is felt in hamstrings and gluteals of crossed-leg side. Hold _30__ seconds.  Repeat _2__ times per session. Do _1__ sessions per day.  Copyright  VHI. All rights reserved.   Supine Knee-to-Chest, Unilateral    Lie on back, hands clasped behind one knee. Pull knee in toward chest until a comfortable stretch is felt in lower back and buttocks. Hold _30__ seconds.  Repeat _2__ times per session. Do _1__ sessions per day. Then can bring knee to opposite chest and hold 30 seconds.  Copyright  VHI. All rights reserved.    With strap on foot, lay at edge of bed/mat. Let affected leg hang off and pull on strap behind back to pull foot up towards body. Hold 30 seconds 2 times, 1 time per day    Lying on your back, start off with legs up against wall. Bring one leg towards you with knee bent at 90 degrees and hold the inner foot. Repeat with other leg. Hold 30 seconds 2 times, 1 time per day  [facility] [address] [place], [place] [address] Phone # [phone] Fax [phone]

## 2017-03-15 NOTE — Therapy (Signed)
[facility] [address] [place], [place], [address] Phone: [phone]   Fax:  [phone]  Physical Therapy Treatment  Patient Details  Name: Ashley Moore MRN: [id number] Date of Birth: 1950-10-12 Referring Provider: Hayden Pedro  Encounter Date: 03/15/2017      PT End of Session - 03/15/17 0927    Visit Number 3   Number of Visits 12   Date for PT Re-Evaluation 04/26/17   PT Start Time 0845   PT Stop Time 0930   PT Time Calculation (min) 45 min   Activity Tolerance Patient tolerated treatment well;Patient limited by pain   Behavior During Therapy [facility] for tasks assessed/performed      Past Medical History:  Diagnosis Date  . Bowen's disease    excised 1992  . Headache   . Hx of varicella   . 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([place])   . Thyroid cancer [facility])     Past Surgical History:  Procedure Laterality Date  . BUNIONECTOMY Right   . IR GENERIC HISTORICAL  11/14/2016   IR US GUIDE VASC ACCESS RIGHT 11/14/2016 [facility]-INTERV RAD  . IR GENERIC HISTORICAL  11/14/2016   IR FLUORO GUIDE CV LINE RIGHT 11/14/2016 [facility]INTER[facility]  . IR GENERIC HISTORICAL  12/12/2016   IR US GUIDE VASC ACCESS RIGHT 12/12/2016 [name], MD [facility]INTERV RAD  . IR GENERIC HISTORICAL  12/12/2016   IR FLUORO GUIDE CV LINE RIGHT 12/12/2016 [name], MD [facility]INTERV RAD  . SKIN CANCER EXCISION     bowens disease    There were no vitals filed for this visit.      Subjective Assessment - 03/15/17 0853    Subjective I Dr. Marcello Moores and Dr. Ammie Dalton yesterday. They said I am healing well.  I will have a CAT scan end of July then get a scope.  They are worried about my liver. I tried the small plus dilator and had difficulty.  I ordered the other dilators.  I am using the Relevum and it is helping.    Pertinent History Diagnosed with anal cancer 6 weeks ago but reports being misdiagnosed by 3 doctors over the past year as having hemorrhoids. They began  chemo and radiation simultaneously about a month ago. 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   How long can you sit comfortably? Few minutes   Patient Stated Goals improve fecal leakage, improve the vaginal opening to improve urine stream   Currently in Pain? No/denies                      Pelvic Floor Special Questions - 03/15/17 0001    Pelvic Floor Internal Exam Patient confirms identification and approves PT to assess the perineal and muscle integrity   Exam Type Vaginal           [facility] Adult PT Treatment/Exercise - 03/15/17 0001      Manual Therapy   Manual Therapy Internal Pelvic Floor   Internal Pelvic Floor outside to the vulva and perineal area due to being to  tender for therapist finger to be internal                PT Education - 03/15/17 0916    Education provided Yes   Education Details stretches for the hips   Person(s) Educated Patient   Methods Explanation;Demonstration;Verbal cues;Handout   Comprehension Returned demonstration;Verbalized understanding          PT Short Term Goals - 03/15/17 6599  PT SHORT TERM GOAL #1   Title independent with initial HEP   Time 4   Period Weeks   Status Achieved     PT SHORT TERM GOAL #2   Title understand on how to use the dilator with movement and how to progress   Time 4   Period Weeks   Status Achieved     PT SHORT TERM GOAL #3   Title understand how to do self perineal massage to separate the labia minora   Time 4   Period Weeks   Status On-going     PT SHORT TERM GOAL #4   Title fecal leakage decreased >/= 25% due to improved pelvic floor muscle control   Time 4   Period Weeks   Status On-going           PT Long Term Goals - 03/01/17 1639      PT LONG TERM GOAL #1   Title independent with HEP   Time 8   Period Weeks   Status New     PT LONG TERM GOAL #2   Title ability to use the largest size dilator and understand she will have to use it for 2 years    Time 8   Period Weeks   Status New     PT LONG TERM GOAL #3   Title urine stream able to go straight and not have to clean herself due to spraying of urine   Time 8   Period Weeks   Status New     PT LONG TERM GOAL #4   Title fecal incontinence decreased >/= 75% due to increase control of pelvic floor muscles   Time 8   Period Weeks   Status New     PT LONG TERM GOAL #5   Title sit for 45 mintues due to skin around the perineal and rectal area is healed from the radiation   Time 8   Period Weeks   Status New     Additional Long Term Goals   Additional Long Term Goals Yes     PT LONG TERM GOAL #6   Title FOTO score is </= 50% limitation   Time 8   Period Weeks   Status New           Long Term Clinic Goals - 01/31/17 1220      CC Long Term Goal  #1   Title Patient will verbalize understanding of lymphedema including treatment and maintenance.   Time 4   Period Weeks   Status Achieved     CC Long Term Goal  #2   Title Reduce bilateral legs to </= 22 cm at 5 cm proximal to 5th MTP.   Baseline 22.5 and 22.9 on L and R on 01/31/17   Time 4   Period Weeks   Status Partially Met     CC Long Term Goal  #3   Title Reduce bilateral legs to </= 35 cm at 20 cm proximal to floor at lateral left leg.   Baseline 33.5 on 01/31/17   Time 4   Period Weeks   Status Achieved     CC Long Term Goal  #4   Title Patient will verbalize where and how to be fitted for compression garments for BLE.   Baseline pt has been fitted and received garments -01/31/17   Time 4   Period Weeks   Status Achieved  Plan - 03/15/17 0900    Clinical Impression Statement Patient was able to sit on a flat surface for the first time.  Patient is still not able to use the small plus so she has ordered different dilator sizes.  Patient will benefit from skilled therapy to reduce pain and improve pelvis floor mobility.    Rehab Potential Good   Clinical Impairments Affecting Rehab  Potential Extent of disease   PT Frequency 2x / week   PT Duration 8 weeks   PT Treatment/Interventions Biofeedback;Therapeutic activities;Therapeutic exercise;Neuromuscular re-education;Patient/family education;Passive range of motion;Scar mobilization;Manual techniques;Energy conservation   PT Next Visit Plan   soft tissue work to labia and internal, relaxation exercises to the pelvic floor   PT Home Exercise Plan progress as needed   Consulted and Agree with Plan of Care Patient      Patient will benefit from skilled therapeutic intervention in order to improve the following deficits and impairments:  Decreased coordination, Increased fascial restricitons, Decreased endurance, Increased muscle spasms, Decreased activity tolerance, Impaired flexibility, Decreased strength, Decreased mobility, Decreased scar mobility, Decreased skin integrity  Visit Diagnosis: Muscle weakness (generalized)  Unspecified lack of coordination  Cramp and spasm  Aftercare following surgery for neoplasm     Problem List Patient Active Problem List   Diagnosis Date Noted  . Port catheter in place 11/21/2016  . 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 Anal cancer ([place]) 11/03/2016  . Cystitis 05/06/2015  . Degenerative cervical disc 04/08/2015  . Trigger point of neck 03/26/2015  . Pain in joint, ankle and foot 04/23/2013  . Visit for preventive health examination 02/13/2013  . Routine gynecological examination 02/13/2013  . Family history of breast cancer in first degree relative 02/13/2013  . Rash and nonspecific skin eruption 02/13/2013  . 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 VITAMIN D DEFICIENCY 06/25/2008  . NECK PAIN 06/02/2008  . FOOT PAIN 06/02/2008  . OSTEOPENIA 06/02/2008   Earlie Counts, PT 03/15/17 9:30 AM    [facility] [address], [place], [place], [address] Phone: [phone]   Fax:   [phone]  Name: Ashley Moore MRN: [id number] Date of Birth: Dec 27, 1950

## 2017-03-16 NOTE — Progress Notes (Signed)
Chief Complaint  Patient presents with  . Follow-up    HPI: Ashley Moore 66 y.o. come in for fu rx for anxiety sadness depression ?  Multiple losses cancer dx and financial family issues  Is on 25 mg sertraline and less anxietus  And concentrating better  To have  Scan  And monitoring .   End July  And notas drastic as  Before   And   anniverary last month.  Got through it  Sleep:  Tired about 4 pm  And then 8 pm and sleep .tizanidine and gabapentin.  No other sleep aids at this time.   No current use of  ativan ROS: See pertinent positives and negatives per HPI.  Past Medical History:  Diagnosis Date  . Bowen's disease    excised 1992  . Headache   . Hx of varicella   . 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([place])   . Thyroid cancer ([place])     Family History  Problem Relation Age of Onset  . Lung cancer Mother        lung  . Pancreatitis Father   . Breast cancer Sister   . Breast cancer Maternal Grandmother        breast   . Breast cancer Maternal Aunt        breast  . Melanoma Sister     Social History   Social History  . Marital status: Widowed    Spouse name: N/A  . Number of children: 0  . Years of education: N/A   Social History Main Topics  . Smoking status: Former Research scientist (life sciences)  . Smokeless tobacco: Former Systems developer    Quit date: 09/20/1995  . Alcohol use 0.0 oz/week     Comment: occasional  . Drug use: Yes    Types: Marijuana  . Sexual activity: No   Other Topics Concern  . None   Social History Narrative   H H  of 1      5 pets.   She is a former smoker   Retired Engineer, mining fine arts   Husband smokes he has active heart disease and vascular disease   etoh   Red wine  1 per night.    Tea green tea and earl gray    Moved from [place] to [place] area in 22-Dec-1983   1 pregnancy   Husband died spring  2016 cv   Sister died 03-24-15  Bone cancer                 Outpatient Medications Prior to Visit  Medication Sig Dispense Refill  . conjugated  estrogens (PREMARIN) vaginal cream Place 1 Applicatorful vaginally at bedtime. Apply to vaginal skin Qhs x 2 weeks then bid weekly with 12 refills 02/15/17    . gabapentin (NEURONTIN) 100 MG capsule TAKE 1 CAPSULE THREE TIMES DAILY (Patient taking differently: Take 2 at night) 270 capsule 3  . meloxicam (MOBIC) 15 MG tablet TAKE 1 TABLET EVERY DAY 90 tablet 0  . prochlorperazine (COMPAZINE) 10 MG tablet Take 1 tablet (10 mg total) by mouth every 6 (six) hours as needed for nausea or vomiting. 40 tablet 0  . sertraline (ZOLOFT) 50 MG tablet 25 mg per day for 1 week then 50 mg per day (Patient taking differently: 12.5 mg. 25 mg per day for 1 week then 50 mg per day) 30 tablet 2  . silver sulfADIAZINE (SILVADENE) 1 % cream Apply 1 application topically 2 (two) times daily. Apply to  areas of skin affected, mush wash off before re-applying    . tiZANidine (ZANAFLEX) 4 MG tablet TAKE 1 TABLET EVERY 8 HOURS AS NEEDED FOR MUSCLE SPASM(S) 90 tablet 5  . loperamide (IMODIUM) 2 MG capsule Take 1 capsule (2 mg total) by mouth 4 (four) times daily as needed for diarrhea or loose stools. (Patient not taking: Reported on 03/17/2017) 12 capsule 0  . LORazepam (ATIVAN) 0.5 MG tablet Take 0.5 mg by mouth every 4 (four) hours as needed for anxiety. Take 1 tab oral every 4-6 hours prn for anxiety or nausea    . ondansetron (ZOFRAN) 4 MG tablet Take 1 tablet (4 mg total) by mouth every 8 (eight) hours as needed for nausea or vomiting. (Patient not taking: Reported on 03/17/2017) 20 tablet 0  . Oxycodone HCl 10 MG TABS Take 1 tablet (10 mg total) by mouth every 4 (four) hours as needed. (Patient not taking: Reported on 03/17/2017) 120 tablet 0   No facility-administered medications prior to visit.      EXAM:  BP 110/80 (BP Location: Right Arm, Patient Position: Sitting, Cuff Size: Normal)   Pulse 85   Temp 98.3 F (36.8 C) (Oral)   Wt 176 lb (79.8 kg)   BMI 32.19 kg/m   Body mass index is 32.19 kg/m.  GENERAL:  vitals reviewed and listed above, alert, oriented, appears well hydrated and in no acute distress tearful at times  Disc losses  But appropriate and nl speech PSYCH: pleasant and cooperative, no obvious depression or anxiety  BP Readings from Last 3 Encounters:  03/17/17 110/80  03/14/17 132/64  02/17/17 102/80   PHQ-SADS Somatic: 10 GAD7: 3 anxiety attacks some PHQ9: 4 Difficulty :  ASSESSMENT AND PLAN:  Discussed the following assessment and plan:  Adjustment reaction with anxiety and depression  Medication management  Complicated bereavement  Anal cancer ([place]) Improved    At this  time disc inc dose and may try 50 mg for now  And rov in 6-8 weeks or as needed  -Patient advised to return or notify health care team  if  new concerns arise.  If unable to proceed or worse  Patient Instructions  Stay on the 25 mg of the sertraline    With  Low threshold to increase to 50 mg per day   May get a benefit from this .     ROV in  6-8 weeks or as needed .    [place] Brooking. Kenneith Stief M.D.

## 2017-03-16 NOTE — Progress Notes (Signed)
[place] Work  Clinical Social Work was referred by patient for assessment of psychosocial needs and supportive counseling session. Clinical Social Worker met with patient at [facility] in [place] office for fourth and final counseling session. Pt continues to work on her goals and has been working hard with PT and also attempting to exercise. She has found PT very helpful to her healing process. She continues on low dose zoloft and feels this is helpful. She inquired if she should discontinue. CSW suggests she discuss this with pre scriber, however, it has helped and allowed her to move forward. Pt has appeared to have some resolution around grief, is adjusting to physical and mental aspects of illness and has found additional positive sources of support/coping techniques. CSW encouraged pt to continue moving forward. Pt and CSW mutually agree this is the last scheduled session due to goal obtainment. Pt aware she can contact CSW as needed for additional support or counseling session. CSW did encourage her to consider the GI Cancer Support group and the Sept. FYNN class for additional survivorship support.   Clinical Social Work interventions:  Supportive counseling session  Loren Racer, LCSW, OSW-C Clinical Social Worker [facility]  [place] Phone: [phone] Fax: [phone]

## 2017-03-17 ENCOUNTER — Ambulatory Visit (INDEPENDENT_AMBULATORY_CARE_PROVIDER_SITE_OTHER): Payer: Medicare HMO | Admitting: Internal Medicine

## 2017-03-17 ENCOUNTER — Encounter: Payer: Self-pay | Admitting: Internal Medicine

## 2017-03-17 VITALS — BP 110/80 | HR 85 | Temp 98.3°F | Wt 176.0 lb

## 2017-03-17 DIAGNOSIS — F4329 Adjustment disorder with other symptoms: Secondary | ICD-10-CM

## 2017-03-17 DIAGNOSIS — F4323 Adjustment disorder with mixed anxiety and depressed mood: Secondary | ICD-10-CM | POA: Diagnosis not present

## 2017-03-17 DIAGNOSIS — F4321 Adjustment disorder with depressed mood: Secondary | ICD-10-CM

## 2017-03-17 DIAGNOSIS — Z634 Disappearance and death of family member: Secondary | ICD-10-CM

## 2017-03-17 DIAGNOSIS — Z79899 Other long term (current) drug therapy: Secondary | ICD-10-CM

## 2017-03-17 DIAGNOSIS — C210 Malignant neoplasm of anus, unspecified: Secondary | ICD-10-CM

## 2017-03-17 NOTE — Patient Instructions (Signed)
Stay on the 25 mg of the sertraline    With  Low threshold to increase to 50 mg per day   May get a benefit from this .     ROV in  6-8 weeks or as needed .

## 2017-03-20 ENCOUNTER — Encounter: Payer: Self-pay | Admitting: Physical Therapy

## 2017-03-20 ENCOUNTER — Ambulatory Visit: Payer: Medicare HMO | Attending: Radiation Oncology | Admitting: Physical Therapy

## 2017-03-20 DIAGNOSIS — M6281 Muscle weakness (generalized): Secondary | ICD-10-CM | POA: Insufficient documentation

## 2017-03-20 DIAGNOSIS — Z483 Aftercare following surgery for neoplasm: Secondary | ICD-10-CM | POA: Diagnosis not present

## 2017-03-20 DIAGNOSIS — R252 Cramp and spasm: Secondary | ICD-10-CM | POA: Insufficient documentation

## 2017-03-20 DIAGNOSIS — R279 Unspecified lack of coordination: Secondary | ICD-10-CM | POA: Insufficient documentation

## 2017-03-20 NOTE — Therapy (Signed)
[facility] [address], [place], [place], [address] Phone: [phone]   Fax:  [phone]  Physical Therapy Treatment  Patient Details  Name: Ashley Moore MRN: [id number] Date of Birth: 1951/07/27 Referring Provider: Hayden Pedro  Encounter Date: 03/20/2017      PT End of Session - 03/20/17 1528    Visit Number 4   Number of Visits 12   Date for PT Re-Evaluation 04/26/17   PT Start Time 7628   PT Stop Time 1528   PT Time Calculation (min) 43 min   Activity Tolerance Patient tolerated treatment well   Behavior During Therapy [facility] for tasks assessed/performed      Past Medical History:  Diagnosis Date  . Bowen's disease    excised 1992  . Headache   . Hx of varicella   . 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([place])   . Thyroid cancer [facility])     Past Surgical History:  Procedure Laterality Date  . BUNIONECTOMY Right   . IR GENERIC HISTORICAL  11/14/2016   IR US GUIDE VASC ACCESS RIGHT 11/14/2016 [facility]  . IR GENERIC HISTORICAL  11/14/2016   IR FLUORO GUIDE CV LINE RIGHT 11/14/2016 [facility]  . IR GENERIC HISTORICAL  12/12/2016   IR US GUIDE VASC ACCESS RIGHT 12/12/2016 [name], MD [facility]  . IR GENERIC HISTORICAL  12/12/2016   IR FLUORO GUIDE CV LINE RIGHT 12/12/2016 [name], MD [facility]  . SKIN CANCER EXCISION     bowens disease    There were no vitals filed for this visit.      Subjective Assessment - 03/20/17 1451    Subjective I am tired.  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 I got the set of dilators and have not had a chance to use them. I worked on my [employer].  Fecal leakage has improved  by 33% better. I have had alot of gas. Stool is mucous and liquid and firm.    Pertinent History Diagnosed with anal cancer 6 weeks ago but reports being misdiagnosed by 3 doctors over the past year as having hemorrhoids. They began chemo and radiation simultaneously about a month ago. 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   How long can you sit comfortably? Few minutes   Patient Stated Goals improve fecal leakage, improve the vaginal opening to improve urine stream   Currently in Pain? No/denies                         [facility] Adult PT Treatment/Exercise - 03/20/17 0001      Manual Therapy   Manual Therapy Internal Pelvic Floor;Soft tissue mobilization;Myofascial release;Manual Lymphatic Drainage (MLD)   Soft tissue mobilization bilateral hip adductors, bil. levator ani externally with pull along the inner thigh,    Myofascial Release bilateral inner thigh with elongation from the pelvic floor   Manual Lymphatic Drainage (MLD) to upper thigh and lower abdominal   Internal Pelvic Floor internal introitus and along the labia minora                  PT Short Term Goals - 03/15/17 3151      PT SHORT TERM GOAL #1   Title independent with initial HEP   Time 4   Period Weeks   Status Achieved     PT SHORT TERM GOAL #2   Title understand on how to use the dilator with movement  and how to progress   Time 4   Period Weeks   Status Achieved     PT SHORT TERM GOAL #3   Title understand how to do self perineal massage to separate the labia minora   Time 4   Period Weeks   Status On-going     PT SHORT TERM GOAL #4   Title fecal leakage decreased >/= 25% due to improved pelvic floor muscle control   Time 4   Period Weeks   Status On-going           PT Long Term Goals - 03/20/17 1458      PT LONG TERM GOAL #1   Title independent with HEP   Time 8   Period Weeks   Status On-going     PT LONG TERM GOAL #2   Title ability to use the largest size dilator and understand she will have to use it for 2 years   Time 8   Period Weeks   Status On-going  just got the full set of dilators     PT LONG TERM GOAL #3   Title urine stream able to go straight and not have to clean herself due to spraying of urine   Time 8   Period  Weeks   Status On-going  still sprays     PT LONG TERM GOAL #4   Title fecal incontinence decreased >/= 75% due to increase control of pelvic floor muscles   Time 8   Period Weeks   Status On-going  33% better     PT LONG TERM GOAL #5   Title sit for 45 mintues due to skin around the perineal and rectal area is healed from the radiation   Time 8   Period Weeks   Status --  hard surface 15 min           Long Term Clinic Goals - 01/31/17 1220      CC Long Term Goal  #1   Title Patient will verbalize understanding of lymphedema including treatment and maintenance.   Time 4   Period Weeks   Status Achieved     CC Long Term Goal  #2   Title Reduce bilateral legs to </= 22 cm at 5 cm proximal to 5th MTP.   Baseline 22.5 and 22.9 on L and R on 01/31/17   Time 4   Period Weeks   Status Partially Met     CC Long Term Goal  #3   Title Reduce bilateral legs to </= 35 cm at 20 cm proximal to floor at lateral left leg.   Baseline 33.5 on 01/31/17   Time 4   Period Weeks   Status Achieved     CC Long Term Goal  #4   Title Patient will verbalize where and how to be fitted for compression garments for BLE.   Baseline pt has been fitted and received garments -01/31/17   Time 4   Period Weeks   Status Achieved            Plan - 03/20/17 1500    Clinical Impression Statement Patient reports she is 33% better with fecal incontinence.  Patient has the dilators but has not moved up.  Patient is having more gas with firmer stools.  Patient continues to have increased tightness in the inner thighs and pelvic floor.  Patient felt less swollen after the lymph drainage. Patient will benefit from skilled therapy to reduce pain and  improve pelvic floor mobility.    Rehab Potential Good   Clinical Impairments Affecting Rehab Potential Extent of disease   PT Frequency 2x / week   PT Duration 8 weeks   PT Treatment/Interventions Biofeedback;Therapeutic activities;Therapeutic  exercise;Neuromuscular re-education;Patient/family education;Passive range of motion;Scar mobilization;Manual techniques;Energy conservation   PT Next Visit Plan   soft tissue work to labia and internal, relaxation exercises to the pelvic floor; lymph drainage   PT Home Exercise Plan progress as needed   Consulted and Agree with Plan of Care Patient      Patient will benefit from skilled therapeutic intervention in order to improve the following deficits and impairments:  Decreased coordination, Increased fascial restricitons, Decreased endurance, Increased muscle spasms, Decreased activity tolerance, Impaired flexibility, Decreased strength, Decreased mobility, Decreased scar mobility, Decreased skin integrity  Visit Diagnosis: Muscle weakness (generalized)  Unspecified lack of coordination  Cramp and spasm  Aftercare following surgery for neoplasm     Problem List Patient Active Problem List   Diagnosis Date Noted  . Port catheter in place 11/21/2016  . 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 Anal cancer ([place]) 11/03/2016  . Cystitis 05/06/2015  . Degenerative cervical disc 04/08/2015  . Trigger point of neck 03/26/2015  . Pain in joint, ankle and foot 04/23/2013  . Visit for preventive health examination 02/13/2013  . Routine gynecological examination 02/13/2013  . Family history of breast cancer in first degree relative 02/13/2013  . Rash and nonspecific skin eruption 02/13/2013  . 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 VITAMIN D DEFICIENCY 06/25/2008  . NECK PAIN 06/02/2008  . FOOT PAIN 06/02/2008  . OSTEOPENIA 06/02/2008    Earlie Counts, PT 03/20/17 3:33 PM   [facility] [address] [place], [place], [address] Phone: [phone]   Fax:  [phone]  Name: PHYILLIS DASCOLI MRN: [id number] Date of Birth: [birth year]

## 2017-03-25 ENCOUNTER — Other Ambulatory Visit: Payer: Self-pay | Admitting: Internal Medicine

## 2017-03-27 ENCOUNTER — Ambulatory Visit: Payer: Medicare HMO | Admitting: Physical Therapy

## 2017-03-27 ENCOUNTER — Encounter: Payer: Self-pay | Admitting: Physical Therapy

## 2017-03-27 DIAGNOSIS — R252 Cramp and spasm: Secondary | ICD-10-CM

## 2017-03-27 DIAGNOSIS — R279 Unspecified lack of coordination: Secondary | ICD-10-CM | POA: Diagnosis not present

## 2017-03-27 DIAGNOSIS — Z483 Aftercare following surgery for neoplasm: Secondary | ICD-10-CM

## 2017-03-27 DIAGNOSIS — M6281 Muscle weakness (generalized): Secondary | ICD-10-CM

## 2017-03-27 NOTE — Patient Instructions (Signed)
Pelvic Floor Release Stretches (NEW)  [employer]  youtube for stretch program [employer] on [facility] [address], [place], [place] [address] Phone # [phone] Fax [phone]

## 2017-03-27 NOTE — Therapy (Signed)
[facility] [address], [place], [place], [address] Phone: [phone]   Fax:  [phone]  Physical Therapy Treatment  Patient Details  Name: JERIE BASFORD MRN: [id number] Date of Birth: 1951/03/04 Referring Provider: Hayden Pedro  Encounter Date: 03/27/2017      PT End of Session - 03/27/17 1538    Visit Number 5   Number of Visits 12   Date for PT Re-Evaluation 04/26/17   PT Start Time 8588   PT Stop Time 1610   PT Time Calculation (min) 40 min   Activity Tolerance Patient tolerated treatment well   Behavior During Therapy [facility] for tasks assessed/performed      Past Medical History:  Diagnosis Date  . Bowen's disease    excised 1992  . Headache   . Hx of varicella   . 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([place])   . Thyroid cancer [facility])     Past Surgical History:  Procedure Laterality Date  . BUNIONECTOMY Right   . IR GENERIC HISTORICAL  11/14/2016   IR US GUIDE VASC ACCESS RIGHT 11/14/2016 [facility]  . IR GENERIC HISTORICAL  11/14/2016   IR FLUORO GUIDE CV LINE RIGHT 11/14/2016 [facility]  . IR GENERIC HISTORICAL  12/12/2016   IR US GUIDE VASC ACCESS RIGHT 12/12/2016 [name], MD [facility]  . IR GENERIC HISTORICAL  12/12/2016   IR FLUORO GUIDE CV LINE RIGHT 12/12/2016 [name], MD [facility]  . SKIN CANCER EXCISION     bowens disease    There were no vitals filed for this visit.      Subjective Assessment - 03/27/17 1535    Subjective I am exercising now.  I have been using the dilator.  I do not think I am going to use the second dilator too long.    Pertinent History Diagnosed with anal cancer 6 weeks ago but reports being misdiagnosed by 3 doctors over the past year as having hemorrhoids. They began chemo and radiation simultaneously about a month ago. 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   How long can you sit comfortably? Few minutes   Patient Stated Goals improve fecal  leakage, improve the vaginal opening to improve urine stream   Currently in Pain? Yes   Pain Score 6    Pain Location Vagina   Pain Orientation Mid   Pain Descriptors / Indicators --  stinging   Pain Type Acute pain   Pain Onset More than a month ago   Pain Frequency Intermittent   Aggravating Factors  when urinating a large amount   Pain Relieving Factors not urinating   Multiple Pain Sites No                      Pelvic Floor Special Questions - 03/27/17 0001    Pelvic Floor Internal Exam Patient confirms identification and approves PT to assess the perineal and muscle integrity   Exam Type Vaginal           [facility] Adult PT Treatment/Exercise - 03/27/17 0001      Lumbar Exercises: Stretches   Single Knee to Chest Stretch 2 reps;30 seconds  bil.    Double Knee to Chest Stretch --  moving hips in circles cc/cw   Double Knee to Chest Stretch Limitations sitting butterfly stretch then move knees side to side   Lower Trunk Rotation 2 reps;30 seconds   Prone on Elbows Stretch 5 reps   Piriformis Stretch  2 reps;30 seconds  bil.      Lumbar Exercises: Seated   Other Seated Lumbar Exercises pelvic rock, diagonals, side to side     Lumbar Exercises: Supine   Other Supine Lumbar Exercises knees bent and rock to floor back and forth   Other Supine Lumbar Exercises feet on ball-isometric contraction with knees up and out 5 sec     Manual Therapy   Manual Therapy Soft tissue mobilization;Myofascial release;Manual Lymphatic Drainage (MLD)   Soft tissue mobilization left hip adductor, left pecctineu, left hamstring insertion   Myofascial Release inner left thigh   Manual Lymphatic Drainage (MLD) to upper thigh and lower abdominal                PT Education - 03/27/17 1616    Education provided Yes   Education Details [employer] fusion you tube video for pelvic floor relaxation stretches   Person(s) Educated Patient   Methods Explanation;Handout    Comprehension Verbalized understanding          PT Short Term Goals - 03/27/17 1713      PT SHORT TERM GOAL #3   Title understand how to do self perineal massage to separate the labia minora   Time 4   Period Weeks   Status Achieved     PT SHORT TERM GOAL #4   Title fecal leakage decreased >/= 25% due to improved pelvic floor muscle control   Time 4   Period Weeks   Status On-going           PT Long Term Goals - 03/20/17 1458      PT LONG TERM GOAL #1   Title independent with HEP   Time 8   Period Weeks   Status On-going     PT LONG TERM GOAL #2   Title ability to use the largest size dilator and understand she will have to use it for 2 years   Time 8   Period Weeks   Status On-going  just got the full set of dilators     PT LONG TERM GOAL #3   Title urine stream able to go straight and not have to clean herself due to spraying of urine   Time 8   Period Weeks   Status On-going  still sprays     PT LONG TERM GOAL #4   Title fecal incontinence decreased >/= 75% due to increase control of pelvic floor muscles   Time 8   Period Weeks   Status On-going  33% better     PT LONG TERM GOAL #5   Title sit for 45 mintues due to skin around the perineal and rectal area is healed from the radiation   Time 8   Period Weeks   Status --  hard surface 15 min           Long Term Clinic Goals - 01/31/17 1220      CC Long Term Goal  #1   Title Patient will verbalize understanding of lymphedema including treatment and maintenance.   Time 4   Period Weeks   Status Achieved     CC Long Term Goal  #2   Title Reduce bilateral legs to </= 22 cm at 5 cm proximal to 5th MTP.   Baseline 22.5 and 22.9 on L and R on 01/31/17   Time 4   Period Weeks   Status Partially Met     CC Long Term Goal  #3  Title Reduce bilateral legs to </= 35 cm at 20 cm proximal to floor at lateral left leg.   Baseline 33.5 on 01/31/17   Time 4   Period Weeks   Status Achieved     CC  Long Term Goal  #4   Title Patient will verbalize where and how to be fitted for compression garments for BLE.   Baseline pt has been fitted and received garments -01/31/17   Time 4   Period Weeks   Status Achieved            Plan - 03/27/17 1539    Clinical Impression Statement Patient is having a sharp pain on the left inner vaginal area that started 1 week ago and she thinks it may be a UTI.  Patient has tightness located in the left inner thigh. Patient responds well to stretches.  Patient will benfit from skilled therapy to reduce pain and improve pelvic floor mobility.    Rehab Potential Good   Clinical Impairments Affecting Rehab Potential Extent of disease   PT Frequency 2x / week   PT Duration 8 weeks   PT Treatment/Interventions Biofeedback;Therapeutic activities;Therapeutic exercise;Neuromuscular re-education;Patient/family education;Passive range of motion;Scar mobilization;Manual techniques;Energy conservation   PT Next Visit Plan   soft tissue work to labia and internal, relaxation exercises to the pelvic floor; lymph drainage   PT Home Exercise Plan progress as needed   Consulted and Agree with Plan of Care Patient      Patient will benefit from skilled therapeutic intervention in order to improve the following deficits and impairments:  Decreased coordination, Increased fascial restricitons, Decreased endurance, Increased muscle spasms, Decreased activity tolerance, Impaired flexibility, Decreased strength, Decreased mobility, Decreased scar mobility, Decreased skin integrity  Visit Diagnosis: Muscle weakness (generalized)  Cramp and spasm  Unspecified lack of coordination  Aftercare following surgery for neoplasm     Problem List Patient Active Problem List   Diagnosis Date Noted  . Port catheter in place 11/21/2016  . 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 Anal cancer ([place]) 11/03/2016  . Cystitis 05/06/2015  . Degenerative cervical  disc 04/08/2015  . Trigger point of neck 03/26/2015  . Pain in joint, ankle and foot 04/23/2013  . Visit for preventive health examination 02/13/2013  . Routine gynecological examination 02/13/2013  . Family history of breast cancer in first degree relative 02/13/2013  . Rash and nonspecific skin eruption 02/13/2013  . 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 VITAMIN D DEFICIENCY 06/25/2008  . NECK PAIN 06/02/2008  . FOOT PAIN 06/02/2008  . OSTEOPENIA 06/02/2008    Earlie Counts, PT 03/27/17 5:14 PM    [facility] [address], [place], [place], [address] Phone: [phone]   Fax:  [phone]  Name: SHAKOYA GILMORE MRN: [id number] Date of Birth: 03-Dec-1950

## 2017-04-03 ENCOUNTER — Ambulatory Visit: Payer: Medicare HMO | Admitting: Physical Therapy

## 2017-04-03 ENCOUNTER — Encounter: Payer: Self-pay | Admitting: Physical Therapy

## 2017-04-03 DIAGNOSIS — Z483 Aftercare following surgery for neoplasm: Secondary | ICD-10-CM

## 2017-04-03 DIAGNOSIS — M6281 Muscle weakness (generalized): Secondary | ICD-10-CM | POA: Diagnosis not present

## 2017-04-03 DIAGNOSIS — R252 Cramp and spasm: Secondary | ICD-10-CM | POA: Diagnosis not present

## 2017-04-03 DIAGNOSIS — R279 Unspecified lack of coordination: Secondary | ICD-10-CM

## 2017-04-03 NOTE — Therapy (Signed)
[facility] [address] [place], [place], [address] Phone: [phone]   Fax:  [phone]  Physical Therapy Treatment  Patient Details  Name: Ashley Moore MRN: [id number] Date of Birth: 10/21/1950 Referring Provider: Hayden Pedro  Encounter Date: 04/03/2017      PT End of Session - 04/03/17 1444    Visit Number 6   Number of Visits 12   Date for PT Re-Evaluation 04/26/17   Authorization Type [employer]   PT Start Time 1400   PT Stop Time 1445   PT Time Calculation (min) 45 min   Activity Tolerance Patient tolerated treatment well   Behavior During Therapy [facility] for tasks assessed/performed      Past Medical History:  Diagnosis Date  . Bowen's disease    excised 1992  . Headache   . Hx of varicella   . 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([place])   . Thyroid cancer [facility])     Past Surgical History:  Procedure Laterality Date  . BUNIONECTOMY Right   . IR GENERIC HISTORICAL  11/14/2016   IR US GUIDE VASC ACCESS RIGHT 11/14/2016 [facility]  . IR GENERIC HISTORICAL  11/14/2016   IR FLUORO GUIDE CV LINE RIGHT 11/14/2016 [facility]  . IR GENERIC HISTORICAL  12/12/2016   IR US GUIDE VASC ACCESS RIGHT 12/12/2016 [name], MD [facility]  . IR GENERIC HISTORICAL  12/12/2016   IR FLUORO GUIDE CV LINE RIGHT 12/12/2016 [name], MD [facility]  . SKIN CANCER EXCISION     bowens disease    There were no vitals filed for this visit.      Subjective Assessment - 04/03/17 1403    Subjective I have been doing the stretches on the you tube and has been helping. Knee are not as stiff with climbing stairs and no pain. I am allergic to cantelope and peaches.  decreased in fecal leakage by 10%.    Pertinent History Diagnosed with anal cancer 6 weeks ago but reports being misdiagnosed by 3 doctors over the past year as having hemorrhoids. They began chemo and radiation simultaneously about a month ago. It has metastasized to  bilateral inguinal nodes.   How long can you sit comfortably? Few minutes   Patient Stated Goals improve fecal leakage, improve the vaginal opening to improve urine stream   Currently in Pain? Yes   Pain Score 4    Pain Location Vagina   Pain Orientation Mid   Pain Descriptors / Indicators --  stinging   Pain Type Acute pain   Pain Onset More than a month ago   Pain Frequency Intermittent   Aggravating Factors  when urinating a large amount   Pain Relieving Factors not urinating   Multiple Pain Sites No                      Pelvic Floor Special Questions - 04/03/17 0001    Pelvic Floor Internal Exam Patient confirms identification and approves PT to assess the perineal and muscle integrity   Exam Type Vaginal   Strength fair squeeze, definite lift           [facility] Adult PT Treatment/Exercise - 04/03/17 0001      Ambulation/Gait   Gait Comments ambulate with increased hip extension, heel toe and upright posture     Exercises   Exercises Other Exercises   Other Exercises  sit on green physioball-pelvic circles, pelvic sway, diagonals; Prone on ball and  rocking back and forth     Lumbar Exercises: Stretches   Press Ups 3 reps;10 seconds  prone on elbows   Quadruped Mid Back Stretch 2 reps;30 seconds  knees wide   Quad Stretch 2 reps;30 seconds  prone with strap     Lumbar Exercises: Quadruped   [place] 10 reps     Manual Therapy   Manual Therapy Manual Lymphatic Drainage (MLD);Internal Pelvic Floor   Manual Lymphatic Drainage (MLD) to upper thigh and lower abdominal; Bil. inner thighs   Internal Pelvic Floor bil. urethra sphincter, bil. obturator internist and introitus                PT Education - 04/03/17 1444    Education provided Yes   Education Details posture with walking   Person(s) Educated Patient   Methods Explanation;Demonstration   Comprehension Verbalized understanding;Returned demonstration          PT Short  Term Goals - 04/03/17 1409      PT SHORT TERM GOAL #4   Title fecal leakage decreased >/= 25% due to improved pelvic floor muscle control   Time 4   Period Weeks   Status On-going  10% better           PT Long Term Goals - 04/03/17 1413      PT LONG TERM GOAL #5   Title sit for 45 mintues due to skin around the perineal and rectal area is healed from the radiation   Time 8   Period Weeks   Status Achieved               Plan - 04/03/17 1445    Clinical Impression Statement Pelvic floor strength is 3/5.  Patient has increased mobility of the introitus.  Patient is now wearing a pad instead of a diaper due to decreased in fecal leakage. Patient reports she is now on the second dilator and it is getting easier.  Patient has increased flexibitiy with her doing more of the stretches. Decreased swelling in the legs due to the lymph drainage. Patient is walking hunched over so after verbal cues she was able to walk upright. Patient will benefit from skilled therapy to reduce pain and improve pelvic floor mobility.    Rehab Potential Good   Clinical Impairments Affecting Rehab Potential Extent of disease   PT Frequency 2x / week   PT Duration 8 weeks   PT Treatment/Interventions Biofeedback;Therapeutic activities;Therapeutic exercise;Neuromuscular re-education;Patient/family education;Passive range of motion;Scar mobilization;Manual techniques;Energy conservation   PT Next Visit Plan   soft tissue work to labia and internal,  lymph drainage; pelvic floor contration and bulging   PT Home Exercise Plan progress as needed   Consulted and Agree with Plan of Care Patient      Patient will benefit from skilled therapeutic intervention in order to improve the following deficits and impairments:  Decreased coordination, Increased fascial restricitons, Decreased endurance, Increased muscle spasms, Decreased activity tolerance, Impaired flexibility, Decreased strength, Decreased mobility,  Decreased scar mobility, Decreased skin integrity  Visit Diagnosis: Muscle weakness (generalized)  Cramp and spasm  Unspecified lack of coordination  Aftercare following surgery for neoplasm     Problem List Patient Active Problem List   Diagnosis Date Noted  . Port catheter in place 11/21/2016  . 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 Anal cancer ([place]) 11/03/2016  . Cystitis 05/06/2015  . Degenerative cervical disc 04/08/2015  . Trigger point of neck 03/26/2015  . Pain in joint,  ankle and foot 04/23/2013  . Visit for preventive health examination 02/13/2013  . Routine gynecological examination 02/13/2013  . Family history of breast cancer in first degree relative 02/13/2013  . Rash and nonspecific skin eruption 02/13/2013  . 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 VITAMIN D DEFICIENCY 06/25/2008  . NECK PAIN 06/02/2008  . FOOT PAIN 06/02/2008  . OSTEOPENIA 06/02/2008    Earlie Counts, PT 04/03/17 2:49 PM   [place] [facility] [address] [place], [place], [address] Phone: [phone]   Fax:  [phone]  Name: Ashley Moore MRN: [id number] Date of Birth: 07-31-1951

## 2017-04-10 ENCOUNTER — Encounter: Payer: Self-pay | Admitting: Physical Therapy

## 2017-04-12 ENCOUNTER — Telehealth: Payer: Self-pay | Admitting: *Deleted

## 2017-04-12 ENCOUNTER — Encounter: Payer: Self-pay | Admitting: Physical Therapy

## 2017-04-12 ENCOUNTER — Ambulatory Visit: Payer: Medicare HMO | Admitting: Physical Therapy

## 2017-04-12 DIAGNOSIS — R279 Unspecified lack of coordination: Secondary | ICD-10-CM

## 2017-04-12 DIAGNOSIS — M6281 Muscle weakness (generalized): Secondary | ICD-10-CM | POA: Diagnosis not present

## 2017-04-12 DIAGNOSIS — Z483 Aftercare following surgery for neoplasm: Secondary | ICD-10-CM

## 2017-04-12 DIAGNOSIS — R252 Cramp and spasm: Secondary | ICD-10-CM | POA: Diagnosis not present

## 2017-04-12 NOTE — Therapy (Signed)
[facility], [facility] [address], [place], [place], [address] Phone: [phone]   Fax:  [phone]  Physical Therapy Treatment  Patient Details  Name: Ashley Moore MRN: [id number] Date of Birth: [birth year] Referring Provider: Hayden Pedro  Encounter Date: 04/12/2017      PT End of Session - 04/12/17 1313    Visit Number 7   Number of Visits 12   Date for PT Re-Evaluation 04/26/17   Authorization Type [employer]   PT Start Time 1230   PT Stop Time 1310   PT Time Calculation (min) 40 min   Activity Tolerance Patient tolerated treatment well   Behavior During Therapy [facility] for tasks assessed/performed      Past Medical History:  Diagnosis Date  . Bowen's disease    excised 1992  . Headache   . Hx of varicella   . 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([place])   . Thyroid cancer [facility])     Past Surgical History:  Procedure Laterality Date  . BUNIONECTOMY Right   . IR GENERIC HISTORICAL  11/14/2016   IR US GUIDE VASC ACCESS RIGHT 11/14/2016 [facility]  . IR GENERIC HISTORICAL  11/14/2016   IR FLUORO GUIDE CV LINE RIGHT 11/14/2016 [facility]  . IR GENERIC HISTORICAL  12/12/2016   IR US GUIDE VASC ACCESS RIGHT 12/12/2016 [name], MD [facility]  . IR GENERIC HISTORICAL  12/12/2016   IR FLUORO GUIDE CV LINE RIGHT 12/12/2016 [name], MD [facility]  . SKIN CANCER EXCISION     bowens disease    There were no vitals filed for this visit.      Subjective Assessment - 04/12/17 1236    Subjective the back of my thighs are sore from the exercises. Fecal leakage is not better.  I have a bowel movement and leak one hour later. I am on #3 of the dilator.    Pertinent History Diagnosed with anal cancer 6 weeks ago but reports being misdiagnosed by 3 doctors over the past year as having hemorrhoids. They began chemo and radiation simultaneously about a month ago. 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   How long can you  sit comfortably? Few minutes   Patient Stated Goals improve fecal leakage, improve the vaginal opening to improve urine stream   Currently in Pain? No/denies                         [facility] Adult PT Treatment/Exercise - 04/12/17 0001      Self-Care   Other Self-Care Comments  discussed how to use the dilator to progress herself and what pain level is appropriate     Exercises   Exercises Other Exercises   Other Exercises  sit on green physioball-pelvic circles, pelvic sway, diagonals; Prone on ball and rocking back and forth     Lumbar Exercises: Stretches   Quadruped Mid Back Stretch 30 seconds;4 reps  using physioball     Lumbar Exercises: Aerobic   Elliptical level 1 for 2.5 min     Lumbar Exercises: Standing   Other Standing Lumbar Exercises stand with legs apart and lift ball overhead and lean back     Manual Therapy   Manual Therapy Manual Lymphatic Drainage (MLD)   Manual Lymphatic Drainage (MLD) to upper thigh and lower abdominal; Bil. inner thighs                PT Education - 04/12/17 1312  Education provided Yes   Education Details anal strength; stretches and dilator   Person(s) Educated Patient   Methods Explanation;Demonstration;Verbal cues;Handout   Comprehension Verbalized understanding;Returned demonstration          PT Short Term Goals - 04/12/17 1300      PT SHORT TERM GOAL #4   Title fecal leakage decreased >/= 25% due to improved pelvic floor muscle control   Time 4   Period Weeks   Status On-going           PT Long Term Goals - 04/03/17 1413      PT LONG TERM GOAL #5   Title sit for 45 mintues due to skin around the perineal and rectal area is healed from the radiation   Time 8   Period Weeks   Status Achieved               Plan - 04/12/17 1254    Clinical Impression Statement Patient has not been able to exercise due to her schedule to being so busy.  Patient has not change with fecal leakage.   Patient has not been stretching.  In therapy we discussed on how to incorportate the exercises into her day and which ones she needs to focus on.  Patient will benefit from skilled therapy to redcue pain and improve pelvic floor mobility.    Rehab Potential Good   Clinical Impairments Affecting Rehab Potential Extent of disease   PT Frequency 2x / week   PT Duration 8 weeks   PT Treatment/Interventions Biofeedback;Therapeutic activities;Therapeutic exercise;Neuromuscular re-education;Patient/family education;Passive range of motion;Scar mobilization;Manual techniques;Energy conservation   PT Next Visit Plan   soft tissue work to labia and internal,  lymph drainage; pelvic floor contration and bulging   PT Home Exercise Plan progress as needed   Consulted and Agree with Plan of Care Patient      Patient will benefit from skilled therapeutic intervention in order to improve the following deficits and impairments:  Decreased coordination, Increased fascial restricitons, Decreased endurance, Increased muscle spasms, Decreased activity tolerance, Impaired flexibility, Decreased strength, Decreased mobility, Decreased scar mobility, Decreased skin integrity  Visit Diagnosis: Muscle weakness (generalized)  Cramp and spasm  Unspecified lack of coordination  Aftercare following surgery for neoplasm     Problem List Patient Active Problem List   Diagnosis Date Noted  . Port catheter in place 11/21/2016  . 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 Anal cancer ([place]) 11/03/2016  . Cystitis 05/06/2015  . Degenerative cervical disc 04/08/2015  . Trigger point of neck 03/26/2015  . Pain in joint, ankle and foot 04/23/2013  . Visit for preventive health examination 02/13/2013  . Routine gynecological examination 02/13/2013  . Family history of breast cancer in first degree relative 02/13/2013  . Rash and nonspecific skin eruption 02/13/2013  . 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  VITAMIN D DEFICIENCY 06/25/2008  . NECK PAIN 06/02/2008  . FOOT PAIN 06/02/2008  . OSTEOPENIA 06/02/2008    Earlie Counts, PT 04/12/17 1:19 PM    [facility] [address] [place], [place], [address] Phone: [phone]   Fax:  [phone]  Name: Ashley Moore MRN: [id number] Date of Birth: 1951-01-06

## 2017-04-12 NOTE — Telephone Encounter (Signed)
Scheduled lab appt prior to CT. Left message for pt with appt time.

## 2017-04-12 NOTE — Patient Instructions (Addendum)
Quick Contraction: Gravity Resisted (Sitting)    Sitting, quickly squeeze then fully relax anus Perform __1_ sets of _5__. Rest for _1__ seconds between sets. Do _2__ times a day. Also do when you feel leakage.  Copyright  VHI. All rights reserved.  Slow Contraction: Gravity Resisted (Sitting)    Sitting, slowly squeeze anus for _5__ seconds. Rest for 5___ seconds. Repeat _10__ times. Do _2_ times a day.  Copyright  VHI. All rights reserved.   [facility] [address] [place], [place] [address] Phone # [phone] Fax [phone]   Stretches and dilator

## 2017-04-12 NOTE — Telephone Encounter (Signed)
Pt returned call, she does not have oral contrast at home. Reports she has a difficult time keeping it down. Instructed pt to check in 2 hours prior to CT to drink contrast there. She voiced understanding, confirmed lab appt.

## 2017-04-13 ENCOUNTER — Ambulatory Visit (HOSPITAL_COMMUNITY)
Admission: RE | Admit: 2017-04-13 | Discharge: 2017-04-13 | Disposition: A | Discharge status: Home / Self Care | Payer: Medicare HMO | Source: Ambulatory Visit | Attending: Nurse Practitioner | Admitting: Nurse Practitioner

## 2017-04-13 ENCOUNTER — Other Ambulatory Visit (HOSPITAL_BASED_OUTPATIENT_CLINIC_OR_DEPARTMENT_OTHER): Payer: Medicare HMO

## 2017-04-13 DIAGNOSIS — K5730 Diverticulosis of large intestine without perforation or abscess without bleeding: Secondary | ICD-10-CM | POA: Diagnosis not present

## 2017-04-13 DIAGNOSIS — C210 Malignant neoplasm of anus, unspecified: Secondary | ICD-10-CM | POA: Insufficient documentation

## 2017-04-13 DIAGNOSIS — I700 Atherosclerosis of aorta: Secondary | ICD-10-CM | POA: Insufficient documentation

## 2017-04-13 LAB — BASIC METABOLIC PANEL
ANION GAP: 8 meq/L (ref 3–11)
BUN: 18.8 mg/dL (ref 7.0–26.0)
CALCIUM: 9.3 mg/dL (ref 8.4–10.4)
CO2: 25 mEq/L (ref 22–29)
CREATININE: 0.8 mg/dL (ref 0.6–1.1)
Chloride: 105 mEq/L (ref 98–109)
EGFR: 78 mL/min/{1.73_m2} — AB (ref >90)
Glucose: 84 mg/dl (ref 70–140)
Potassium: 4.7 mEq/L (ref 3.5–5.1)
SODIUM: 138 meq/L (ref 136–145)

## 2017-04-13 MED ORDER — IOPAMIDOL (ISOVUE-300) INJECTION 61%
INTRAVENOUS | Status: AC
  Filled 2017-04-13: qty 100

## 2017-04-13 MED ORDER — IOPAMIDOL (ISOVUE-300) INJECTION 61%
100.0000 mL | Freq: Once | INTRAVENOUS | Status: AC | PRN
  Administered 2017-04-13: 100 mL via INTRAVENOUS

## 2017-04-13 MED ORDER — IOPAMIDOL (ISOVUE-300) INJECTION 61%
INTRAVENOUS | Status: AC
  Filled 2017-04-13: qty 30

## 2017-04-13 MED ORDER — IOPAMIDOL (ISOVUE-300) INJECTION 61%
30.0000 mL | Freq: Once | INTRAVENOUS | Status: AC | PRN
  Administered 2017-04-13: 30 mL via ORAL

## 2017-04-14 ENCOUNTER — Encounter: Payer: Self-pay | Admitting: Nurse Practitioner

## 2017-04-18 ENCOUNTER — Encounter: Payer: Self-pay | Admitting: Physical Therapy

## 2017-04-20 ENCOUNTER — Ambulatory Visit (HOSPITAL_BASED_OUTPATIENT_CLINIC_OR_DEPARTMENT_OTHER): Payer: Medicare HMO | Admitting: Oncology

## 2017-04-20 ENCOUNTER — Telehealth: Payer: Self-pay | Admitting: Oncology

## 2017-04-20 VITALS — BP 150/81 | HR 68 | Temp 98.3°F | Resp 18 | Ht 62.0 in | Wt 179.0 lb

## 2017-04-20 DIAGNOSIS — C210 Malignant neoplasm of anus, unspecified: Secondary | ICD-10-CM

## 2017-04-20 NOTE — Progress Notes (Signed)
  [facility] OFFICE PROGRESS NOTE   Diagnosis: Anal cancer  INTERVAL HISTORY:   Ashley Moore returns as scheduled. She feels well. No difficulty with bowel function. She has splaying of the urine stream secondary to fusion of the labia. She is participated eating and pelvic physical therapy. She is scheduled for an appointment with Dr. Quentin Cornwall for anal surveillance next month.  Objective:  Vital signs in last 24 hours:  Blood pressure (!) 150/81, pulse 68, temperature 98.3 F (36.8 C), temperature source Oral, resp. rate 18, height 5\' 2"  (1.575 m), weight 179 lb (81.2 kg), SpO2 98 %.    HEENT: Neck without mass Lymphatics: No cervical, supraclavicular, axillary, or inguinal nodes Resp: Lungs clear bilaterally Cardio: Regular rate and rhythm GI: No hepatosplenomegaly, no mass, nontender Vascular: Trace ankle edema bilaterally  Skin: Radiation hyperpigmentation at the groin/labia and perineum. Mild erythema at the anal verge. No palpable mass on external examination.     Imaging:  CT abdomen/pelvis 04/13/2017-near complete resolution of the soft tissue mass at the anus, radiation changes in the pelvis, no pathologic lymphadenopathy. Resolution of enlarged inguinal nodes. Medications: I have reviewed the patient's current medications.  1. Anal cancer ? CT abdomen/pelvis 10/21/2016-thickening of the anus extending to the junction between the anus and rectum with a large stool ball in the rectum and mild fat stranding posterior to the rectum. Enlarged lymph nodes in the right and left inguinal regions. A few mildly prominent nodesseen posterior to the rectum. ? 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 ? PET scan [id number] hypermetabolic anal mass with hypermetabolic metastases to the groin region bilaterally, left pelvic sidewall and presacral space. ? Initiation of radiation and cycle 1 5-FU/mitomycin C 11/14/2016 ? Cycle 2  5-FU/mitomycin C 12/12/2016 (5-FU dose reduced due to mucositis, diarrhea, skin breakdown) ? Radiation completed 12/23/2016 ? CT abdomen/pelvis 04/14/2017-resolution of anal mass and bilateral inguinal lymphadenopathy. No residual tumor seen. 2. Left labial lesions. Question direct extension from the anal cancer versus metastatic disease from anal cancer versus a separate malignant process. 3. History of pain and bleeding secondary to #1 and skin breakdown 4. 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 5. Multiple family members with breast cancer. 6. History of hypokalemia-likely secondary to decreased nutritional intake and diarrhea; potassium in normal range 01/16/2017. No longer taking a potassium supplement.    Disposition:  Ashley Moore is in clinical remission from anal cancer. The restaging CT reveals resolution of the anal mass and inguinal adenopathy. She is scheduled for a surgical appointment next month for anal surveillance. She will be seen in radiation oncology in December. She will return for an office visit here in March 2019. We will be sure she has a GYN referral to address the labia effusion. She continues pelvic physical therapy.  15 minutes were spent with the patient today. The majority of the time was used for counseling and coordination of care.  Donneta Romberg, MD  04/20/2017  10:01 AM

## 2017-04-20 NOTE — Telephone Encounter (Signed)
Spoke with patient regarding her appointment next March.

## 2017-04-24 ENCOUNTER — Other Ambulatory Visit: Payer: Self-pay | Admitting: Internal Medicine

## 2017-04-27 ENCOUNTER — Telehealth: Payer: Self-pay | Admitting: Radiation Oncology

## 2017-04-27 DIAGNOSIS — Q525 Fusion of labia: Secondary | ICD-10-CM

## 2017-04-27 NOTE — Telephone Encounter (Signed)
I spoke with the patient regarding her persistent labial fusion and she is interested in a referral to Dr. Myrene Galas at the [facility] in [place] before trying [place] [facility] that he suggested. She has had labial agglutination of the labia majora which keeps her from having normal urinary stream, as well as dyspareunia with intercourse despite using a vaginal dilator and premarin vaginal cream. Referral will be placed.

## 2017-04-28 NOTE — Progress Notes (Deleted)
No chief complaint on file.   HPI: Ashley Moore 66 y.o. come in for fu meds  ROS: See pertinent positives and negatives per HPI.  Past Medical History:  Diagnosis Date  . Bowen's disease    excised 1992  . Headache   . Hx of varicella   . 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([place])   . Thyroid cancer ([place])     Family History  Problem Relation Age of Onset  . Lung cancer Mother        lung  . Pancreatitis Father   . Breast cancer Sister   . Breast cancer Maternal Grandmother        breast   . Breast cancer Maternal Aunt        breast  . Melanoma Sister     Social History   Social History  . Marital status: Widowed    Spouse name: N/A  . Number of children: 0  . Years of education: N/A   Social History Main Topics  . Smoking status: Former Research scientist (life sciences)  . Smokeless tobacco: Former Systems developer    Quit date: 09/20/1995  . Alcohol use 0.0 oz/week     Comment: occasional  . Drug use: Yes    Types: Marijuana  . Sexual activity: No   Other Topics Concern  . Not on file   Social History Narrative   H H  of 1      5 pets.   She is a former smoker   Retired Engineer, mining fine arts   Husband smokes he has active heart disease and vascular disease   etoh   Red wine  1 per night.    Tea green tea and earl gray    Moved from [place] to [place] area in 12-23-1983   1 pregnancy   Husband died spring  2016 cv   Sister died 25-Mar-2015  Bone cancer                 Outpatient Medications Prior to Visit  Medication Sig Dispense Refill  . conjugated estrogens (PREMARIN) vaginal cream Place 1 Applicatorful vaginally at bedtime. Apply to vaginal skin Qhs x 2 weeks then bid weekly with 12 refills 02/15/17    . gabapentin (NEURONTIN) 100 MG capsule TAKE 1 CAPSULE THREE TIMES DAILY (Patient taking differently: Take 2 at night) 270 capsule 3  . loperamide (IMODIUM) 2 MG capsule Take 1 capsule (2 mg total) by mouth 4 (four) times daily as needed for diarrhea or loose stools. (Patient not  taking: Reported on 03/17/2017) 12 capsule 0  . LORazepam (ATIVAN) 0.5 MG tablet Take 0.5 mg by mouth every 4 (four) hours as needed for anxiety. Take 1 tab oral every 4-6 hours prn for anxiety or nausea    . meloxicam (MOBIC) 15 MG tablet TAKE 1 TABLET EVERY DAY 90 tablet 0  . sertraline (ZOLOFT) 50 MG tablet TAKE 1/2 TABLET BY MOUTH ONCE A DAY FOR 1 WEEK THEN 1 TABLET PER DAY 30 tablet 0  . tiZANidine (ZANAFLEX) 4 MG tablet TAKE 1 TABLET EVERY 8 HOURS AS NEEDED FOR MUSCLE SPASM(S) 90 tablet 5   No facility-administered medications prior to visit.      EXAM:  There were no vitals taken for this visit.  There is no height or weight on file to calculate BMI.  GENERAL: vitals reviewed and listed above, alert, oriented, appears well hydrated and in no acute distress HEENT: atraumatic, conjunctiva  clear, no obvious abnormalities on inspection of  external nose and ears OP : no lesion edema or exudate  NECK: no obvious masses on inspection palpation  LUNGS: clear to auscultation bilaterally, no wheezes, rales or rhonchi, good air movement CV: HRRR, no clubbing cyanosis or  peripheral edema nl cap refill  MS: moves all extremities without noticeable focal  abnormality PSYCH: pleasant and cooperative, no obvious depression or anxiety Lab Results  Component Value Date   WBC 4.3 12/27/2016   HGB 10.3 (L) 12/27/2016   HCT 31.1 (L) 12/27/2016   PLT 158 12/27/2016   GLUCOSE 84 04/13/2017   CHOL 165 03/11/2015   TRIG 66.0 03/11/2015   HDL 67.30 03/11/2015   LDLCALC 85 03/11/2015   ALT 43 12/27/2016   AST 57 (H) 12/27/2016   NA 138 04/13/2017   K 4.7 04/13/2017   CL 94 (L) 12/27/2016   CREATININE 0.8 04/13/2017   BUN 18.8 04/13/2017   CO2 25 04/13/2017   TSH 4.09 10/21/2015   HGBA1C 5.5 05/11/2015   BP Readings from Last 3 Encounters:  04/20/17 (!) 150/81  03/17/17 110/80  03/14/17 132/64    ASSESSMENT AND PLAN:  Discussed the following assessment and plan:  No diagnosis  found.  -Patient advised to return or notify health care team  if  new concerns arise.  There are no Patient Instructions on file for this visit.   Standley Brooking. Ashley Moore M.D.

## 2017-05-02 ENCOUNTER — Ambulatory Visit: Payer: Medicare HMO | Attending: Radiation Oncology | Admitting: Physical Therapy

## 2017-05-02 ENCOUNTER — Encounter: Payer: Self-pay | Admitting: Physical Therapy

## 2017-05-02 DIAGNOSIS — R279 Unspecified lack of coordination: Secondary | ICD-10-CM | POA: Insufficient documentation

## 2017-05-02 DIAGNOSIS — R252 Cramp and spasm: Secondary | ICD-10-CM

## 2017-05-02 DIAGNOSIS — Z483 Aftercare following surgery for neoplasm: Secondary | ICD-10-CM | POA: Diagnosis not present

## 2017-05-02 DIAGNOSIS — M6281 Muscle weakness (generalized): Secondary | ICD-10-CM | POA: Diagnosis not present

## 2017-05-02 NOTE — Therapy (Signed)
[facility] [address], [place], [place], [address] Phone: [phone]   Fax:  [phone]  Physical Therapy Treatment  Patient Details  Name: Ashley Moore MRN: [id number] Date of Birth: 1951/01/18 Referring Provider: Hayden Pedro  Encounter Date: 05/02/2017      PT End of Session - 05/02/17 1259    Visit Number 8   Number of Visits 12   Date for PT Re-Evaluation 06/21/17   Authorization Type [employer]   PT Start Time 1237   PT Stop Time 1315   PT Time Calculation (min) 38 min   Activity Tolerance Patient tolerated treatment well   Behavior During Therapy [facility] for tasks assessed/performed      Past Medical History:  Diagnosis Date  . Bowen's disease    excised 1992  . Headache   . Hx of varicella   . 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([place])   . Thyroid cancer [facility])     Past Surgical History:  Procedure Laterality Date  . BUNIONECTOMY Right   . IR GENERIC HISTORICAL  11/14/2016   IR US GUIDE VASC ACCESS RIGHT 11/14/2016 [facility]  . IR GENERIC HISTORICAL  11/14/2016   IR FLUORO GUIDE CV LINE RIGHT 11/14/2016 [facility]  . IR GENERIC HISTORICAL  12/12/2016   IR US GUIDE VASC ACCESS RIGHT 12/12/2016 [name], MD [facility]  . IR GENERIC HISTORICAL  12/12/2016   IR FLUORO GUIDE CV LINE RIGHT 12/12/2016 [name], MD [facility]  . SKIN CANCER EXCISION     bowens disease    There were no vitals filed for this visit.      Subjective Assessment - 05/02/17 1241    Subjective I feel much. The skin with my vagina when I was using the dilators I had blood.  It felt wierd for the dilator to go in. MD said the outside and inside labia is fused. I will see the urogyneocologist in October.  I have my scope this week.  I saw Dr. Benay Spice. Said I was in remission and everything is good. I have been exercising everyday.    Pertinent History Diagnosed with anal cancer 6 weeks ago but reports being misdiagnosed by 3  doctors over the past year as having hemorrhoids. They began chemo and radiation simultaneously about a month ago. 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   How long can you sit comfortably? Few minutes   Patient Stated Goals improve fecal leakage, improve the vaginal opening to improve urine stream   Currently in Pain? No/denies            [facility] PT Assessment - 05/02/17 0001      Assessment   Medical Diagnosis C21.0 anal cancer; c79.82 Secondary malignant neoplam of vulva   Onset Date/Surgical Date 11/26/16   Prior Therapy none     Precautions   Precautions Other (comment)   Precaution Comments Cancer     Restrictions   Weight Bearing Restrictions No     Home Environment   Living Environment Private residence     Prior Function   Level of Independence Independent   Vocation Full time employment   Vocation Requirements standing     Cognition   Overall Cognitive Status Within Functional Limits for tasks assessed     Observation/Other Assessments   Focus on Therapeutic Outcomes (FOTO)  67% limitation     Posture/Postural Control   Posture/Postural Control No significant limitations     AROM   Overall AROM  Comments Lumbar ROM is full     Strength   Overall Strength Comments bil. hip abduction 4/5; abdominal strength is 2/5     Transfers   Transfers Not assessed     Ambulation/Gait   Ambulation/Gait No                  Pelvic Floor Special Questions - 05/02/17 0001    Urinary Leakage No   Fecal incontinence Yes  muscus from intestines   External Perineal Exam radiation changes of the skin around the perineum   Perineal Body/Introitus  Other   Pelvic Floor Internal Exam Patient confirms identification and approves PT to assess the perineal and muscle integrity   Exam Type Vaginal   Palpation both labia are fused together, Therapist unable to place her finger into the vagina due to intense pain but able to touch the introitus.              [facility] Adult PT Treatment/Exercise - 05/02/17 0001      Manual Therapy   Manual Therapy Myofascial release   Soft tissue mobilization outside of perineaum to elongate the tissue to reduce the fascial restrictions                PT Education - 05/02/17 1358    Education provided Yes   Education Details educated patient on how to use a q-tip to work on desensitization of the vaginal tissue   Person(s) Educated Patient   Methods Explanation   Comprehension Verbalized understanding          PT Short Term Goals - 05/02/17 1408      PT SHORT TERM GOAL #1   Title independent with initial HEP   Time 4   Period Weeks   Status Achieved     PT SHORT TERM GOAL #2   Title understand on how to use the dilator with movement and how to progress   Time 4   Period Weeks   Status Achieved     PT SHORT TERM GOAL #3   Title understand how to do self perineal massage to separate the labia minora   Time 4   Period Weeks   Status Achieved     PT SHORT TERM GOAL #4   Title fecal leakage decreased >/= 25% due to improved pelvic floor muscle control   Time 4   Period Weeks   Status Achieved           PT Long Term Goals - 05/02/17 1409      PT LONG TERM GOAL #1   Title independent with HEP   Time 8   Period Weeks   Status On-going     PT LONG TERM GOAL #2   Title ability to use the largest size dilator and understand she will have to use it for 2 years   Time 8   Period Weeks   Status On-going     PT LONG TERM GOAL #3   Title urine stream able to go straight and not have to clean herself due to spraying of urine   Time 8   Period Weeks   Status Deferred     PT LONG TERM GOAL #4   Title fecal incontinence decreased >/= 75% due to increase control of pelvic floor muscles   Time 8   Period Weeks   Status On-going  40% better     PT LONG TERM GOAL #5   Title sit for 45 mintues due  to skin around the perineal and rectal area is healed from the radiation   Time  8   Period Weeks   Status Achieved     PT LONG TERM GOAL #6   Title FOTO score is </= 50% limitation   Time 8   Period Weeks   Status New               Plan - 05/02/17 1359    Clinical Impression Statement Patient has fecal leakage the day after she eats vegetables and a clear liquid will come out.  Patient reports not urinary leakage. Patient labial majoria and minora making her urine spray all over. Patient was using the dilaotr but had increased pain one day with blood and stopped using it.  Patient now is unable to have the therapist place her finger into the vagina. Patient strength in bil. hips is 5/5.  Patient s now exercising daily.  Patient will benefit from skilled therapy to improve fascial restrictions around the vagina from fibrotic skin from radiation.    Rehab Potential Good   Clinical Impairments Affecting Rehab Potential Extent of disease   PT Frequency Biweekly   PT Duration Other (comment)  4 months   PT Treatment/Interventions Biofeedback;Therapeutic activities;Therapeutic exercise;Neuromuscular re-education;Patient/family education;Passive range of motion;Scar mobilization;Manual techniques;Energy conservation   PT Next Visit Plan   soft tissue work to labia and internal,  lymph drainage; bulging of perineum   PT Home Exercise Plan progress as needed   Recommended Other Services recert sent to MD   Consulted and Agree with Plan of Care Patient      Patient will benefit from skilled therapeutic intervention in order to improve the following deficits and impairments:  Decreased coordination, Increased fascial restricitons, Decreased endurance, Increased muscle spasms, Decreased activity tolerance, Impaired flexibility, Decreased strength, Decreased mobility, Decreased scar mobility, Decreased skin integrity  Visit Diagnosis: Muscle weakness (generalized) - Plan: PT plan of care cert/re-cert  Cramp and spasm - Plan: PT plan of care cert/re-cert  Unspecified  lack of coordination - Plan: PT plan of care cert/re-cert  Aftercare following surgery for neoplasm - Plan: PT plan of care cert/re-cert     Problem List Patient Active Problem List   Diagnosis Date Noted  . 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 Port catheter in place 11/21/2016  . 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 Anal cancer ([place]) 11/03/2016  . Cystitis 05/06/2015  . Degenerative cervical disc 04/08/2015  . Trigger point of neck 03/26/2015  . Pain in joint, ankle and foot 04/23/2013  . Visit for preventive health examination 02/13/2013  . Routine gynecological examination 02/13/2013  . Family history of breast cancer in first degree relative 02/13/2013  . Rash and nonspecific skin eruption 02/13/2013  . 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 VITAMIN D DEFICIENCY 06/25/2008  . NECK PAIN 06/02/2008  . FOOT PAIN 06/02/2008  . OSTEOPENIA 06/02/2008    Earlie Counts, PT 05/02/17 2:30 PM   [facility] [address] [place], [place], [address] Phone: [phone]   Fax:  [phone]  Name: Ashley Moore MRN: [id number] Date of Birth: [birth year]

## 2017-05-04 DIAGNOSIS — C210 Malignant neoplasm of anus, unspecified: Secondary | ICD-10-CM | POA: Diagnosis not present

## 2017-05-08 ENCOUNTER — Ambulatory Visit: Payer: Self-pay | Admitting: Internal Medicine

## 2017-05-08 NOTE — Progress Notes (Signed)
Chief Complaint  Patient presents with  . Follow-up    HPI: Ashley Moore 66 y.o. come in for  Fu depression  Reactive management  Has been on sertraline 25 optino to inc to 50 mg  Last scan showed she was "cancer free" .     Not depressed  And has lot more energy  And   Having more motivation.   on 50 mg per day .   Still has issues deal with but doing much better .Marland Kitchen No co se of meds  ROS: See pertinent positives and negatives per HPI.  Past Medical History:  Diagnosis Date  . Bowen's disease    excised 1992  . Headache   . Hx of varicella   . 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([place])   . Thyroid cancer ([place])     Family History  Problem Relation Age of Onset  . Lung cancer Mother        lung  . Pancreatitis Father   . Breast cancer Sister   . Breast cancer Maternal Grandmother        breast   . Breast cancer Maternal Aunt        breast  . Melanoma Sister     Social History   Social History  . Marital status: Widowed    Spouse name: N/A  . Number of children: 0  . Years of education: N/A   Social History Main Topics  . Smoking status: Former Research scientist (life sciences)  . Smokeless tobacco: Former Systems developer    Quit date: 09/20/1995  . Alcohol use 0.0 oz/week     Comment: occasional  . Drug use: Yes    Types: Marijuana  . Sexual activity: No   Other Topics Concern  . None   Social History Narrative   H H  of 1      5 pets.   She is a former smoker   Retired Engineer, mining fine arts   Husband smokes he has active heart disease and vascular disease   etoh   Red wine  1 per night.    Tea green tea and earl gray    Moved from [place] to [place] area in 1984/01/13   1 pregnancy   Husband died spring  2016 cv   Sister died 04/15/15  Bone cancer                 Outpatient Medications Prior to Visit  Medication Sig Dispense Refill  . conjugated estrogens (PREMARIN) vaginal cream Place 1 Applicatorful vaginally at bedtime. Apply to vaginal skin Qhs x 2 weeks then bid weekly  with 12 refills 02/15/17    . gabapentin (NEURONTIN) 100 MG capsule TAKE 1 CAPSULE THREE TIMES DAILY (Patient taking differently: Take 2 at night) 270 capsule 3  . meloxicam (MOBIC) 15 MG tablet TAKE 1 TABLET EVERY DAY 90 tablet 0  . sertraline (ZOLOFT) 50 MG tablet TAKE 1/2 TABLET BY MOUTH ONCE A DAY FOR 1 WEEK THEN 1 TABLET PER DAY 30 tablet 0  . tiZANidine (ZANAFLEX) 4 MG tablet TAKE 1 TABLET EVERY 8 HOURS AS NEEDED FOR MUSCLE SPASM(S) 90 tablet 5  . loperamide (IMODIUM) 2 MG capsule Take 1 capsule (2 mg total) by mouth 4 (four) times daily as needed for diarrhea or loose stools. 12 capsule 0  . LORazepam (ATIVAN) 0.5 MG tablet Take 0.5 mg by mouth every 4 (four) hours as needed for anxiety. Take 1 tab oral every 4-6 hours prn for anxiety or  nausea     No facility-administered medications prior to visit.      EXAM:  BP 102/70 (BP Location: Right Arm, Patient Position: Sitting, Cuff Size: Normal)   Pulse 70   Temp 97.9 F (36.6 C) (Oral)   Wt 177 lb 9.6 oz (80.6 kg)   BMI 32.48 kg/m   Body mass index is 32.48 kg/m.  GENERAL: vitals reviewed and listed above, alert, oriented, appears well hydrated and in no acute distress HEENT: atraumatic, conjunctiva  clear, no obvious abnormalities on inspection of external nose and earsPSYCH: pleasant and cooperative, no obvious depression or anxiety  BP Readings from Last 3 Encounters:  05/09/17 102/70  04/20/17 (!) 150/81  03/17/17 110/80   Wt Readings from Last 3 Encounters:  05/09/17 177 lb 9.6 oz (80.6 kg)  04/20/17 179 lb (81.2 kg)  03/17/17 176 lb (79.8 kg)    ASSESSMENT AND PLAN:  Discussed the following assessment and plan:  Adjustment reaction with anxiety and depression - much improved so far  stay on med  rov 6 mos of as needed   Medication management  Complicated bereavement She is doing much better   Med helping  And good news also  -Patient advised to return or notify health care team  if  new concerns  arise.  Patient Instructions  Stay on the sertraline   t the same dose  At least 9 months or a year.   ROV  6 mos or as needed     Standley Brooking. Tison Leibold M.D.

## 2017-05-09 ENCOUNTER — Ambulatory Visit (INDEPENDENT_AMBULATORY_CARE_PROVIDER_SITE_OTHER): Payer: Medicare HMO | Admitting: Internal Medicine

## 2017-05-09 ENCOUNTER — Encounter: Payer: Self-pay | Admitting: Internal Medicine

## 2017-05-09 VITALS — BP 102/70 | HR 70 | Temp 97.9°F | Wt 177.6 lb

## 2017-05-09 DIAGNOSIS — Z634 Disappearance and death of family member: Secondary | ICD-10-CM | POA: Diagnosis not present

## 2017-05-09 DIAGNOSIS — F4329 Adjustment disorder with other symptoms: Secondary | ICD-10-CM | POA: Diagnosis not present

## 2017-05-09 DIAGNOSIS — F4321 Adjustment disorder with depressed mood: Secondary | ICD-10-CM

## 2017-05-09 DIAGNOSIS — Z79899 Other long term (current) drug therapy: Secondary | ICD-10-CM | POA: Diagnosis not present

## 2017-05-09 DIAGNOSIS — F4323 Adjustment disorder with mixed anxiety and depressed mood: Secondary | ICD-10-CM | POA: Diagnosis not present

## 2017-05-09 NOTE — Patient Instructions (Addendum)
Stay on the sertraline   t the same dose  At least 9 months or a year.   ROV  6 mos or as needed

## 2017-05-10 ENCOUNTER — Other Ambulatory Visit: Payer: Self-pay | Admitting: Neurology

## 2017-05-16 ENCOUNTER — Ambulatory Visit: Payer: Medicare HMO | Admitting: Physical Therapy

## 2017-05-16 ENCOUNTER — Encounter: Payer: Self-pay | Admitting: Physical Therapy

## 2017-05-16 DIAGNOSIS — M6281 Muscle weakness (generalized): Secondary | ICD-10-CM | POA: Diagnosis not present

## 2017-05-16 DIAGNOSIS — R279 Unspecified lack of coordination: Secondary | ICD-10-CM | POA: Diagnosis not present

## 2017-05-16 DIAGNOSIS — Z483 Aftercare following surgery for neoplasm: Secondary | ICD-10-CM

## 2017-05-16 DIAGNOSIS — R252 Cramp and spasm: Secondary | ICD-10-CM | POA: Diagnosis not present

## 2017-05-16 NOTE — Therapy (Signed)
[facility] [address] [place], [place], [address] Phone: [phone]   Fax:  [phone]  Physical Therapy Treatment  Patient Details  Name: Ashley Moore MRN: [id number] Date of Birth: 1951/07/08 Referring Provider: Hayden Pedro  Encounter Date: 05/16/2017      PT End of Session - 05/16/17 1417    Visit Number 9   Number of Visits 12   Date for PT Re-Evaluation 06/21/17   Authorization Type [employer]; G-code 20th visit   PT Start Time 1411  patient came late   PT Stop Time 1445   PT Time Calculation (min) 34 min   Activity Tolerance Patient tolerated treatment well   Behavior During Therapy [facility] for tasks assessed/performed      Past Medical History:  Diagnosis Date  . Bowen's disease    excised 1992  . Headache   . Hx of varicella   . 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([place])   . Thyroid cancer [facility])     Past Surgical History:  Procedure Laterality Date  . BUNIONECTOMY Right   . IR GENERIC HISTORICAL  11/14/2016   IR US GUIDE VASC ACCESS RIGHT 11/14/2016 [facility]  . IR GENERIC HISTORICAL  11/14/2016   IR FLUORO GUIDE CV LINE RIGHT 11/14/2016 [facility]  . IR GENERIC HISTORICAL  12/12/2016   IR US GUIDE VASC ACCESS RIGHT 12/12/2016 [name], MD [facility]  . IR GENERIC HISTORICAL  12/12/2016   IR FLUORO GUIDE CV LINE RIGHT 12/12/2016 [name], MD [facility]  . SKIN CANCER EXCISION     bowens disease    There were no vitals filed for this visit.      Subjective Assessment - 05/16/17 1412    Subjective I am still having pain in my legs but I am more flexible. The skin in my vagina is raw.  Dr. Marvel Plan put me on Desitin.  He did a scope on me and there is no cancer and wants me to use no cancer. I see a doctor in October to assess the vaginal area in [place].  I was able to urinate straight down for several days.    Pertinent History Diagnosed with anal cancer 6 weeks ago but reports  being misdiagnosed by 3 doctors over the past year as having hemorrhoids. They began chemo and radiation simultaneously about a month ago. 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   How long can you sit comfortably? Few minutes   Patient Stated Goals improve fecal leakage, improve the vaginal opening to improve urine stream   Currently in Pain? No/denies   Multiple Pain Sites No                      Pelvic Floor Special Questions - 05/16/17 0001    Pelvic Floor Internal Exam Patient confirms identification and approves PT to assess the perineal and muscle integrity   Exam Type Vaginal   Palpation when touch the introitus pain is 6/10           [facility] Adult PT Treatment/Exercise - 05/16/17 0001      Manual Therapy   Manual Therapy Myofascial release;Internal Pelvic Floor;Soft tissue mobilization   Soft tissue mobilization bil. ischiocavernosus and bulbocavernosus   Myofascial Release of the urogenital diaphgram releasing the 3 planes of fascia   Internal Pelvic Floor attempted to place the therapist index finger into the introitus but patient pain was 6/10  PT Education - 05/16/17 1444    Education provided Yes   Education Details educated patient on how to massage the ischiocavernosus and bulbocavernosus, and q_tip   Person(s) Educated Patient   Methods Explanation   Comprehension Verbalized understanding          PT Short Term Goals - 05/02/17 1408      PT SHORT TERM GOAL #1   Title independent with initial HEP   Time 4   Period Weeks   Status Achieved     PT SHORT TERM GOAL #2   Title understand on how to use the dilator with movement and how to progress   Time 4   Period Weeks   Status Achieved     PT SHORT TERM GOAL #3   Title understand how to do self perineal massage to separate the labia minora   Time 4   Period Weeks   Status Achieved     PT SHORT TERM GOAL #4   Title fecal leakage decreased >/= 25% due to  improved pelvic floor muscle control   Time 4   Period Weeks   Status Achieved           PT Long Term Goals - 05/16/17 1548      PT LONG TERM GOAL #1   Title independent with HEP   Time 8   Period Weeks   Status On-going     PT LONG TERM GOAL #2   Title ability to use the largest size dilator and understand she will have to use it for 2 years   Period Weeks   Status On-going  too painful, only able to use the q-tip     PT LONG TERM GOAL #3   Title urine stream able to go straight and not have to clean herself due to spraying of urine   Time 8   Period Weeks   Status Deferred     PT LONG TERM GOAL #4   Title fecal incontinence decreased >/= 75% due to increase control of pelvic floor muscles   Time 8   Period Weeks   Status On-going  40% better     PT LONG TERM GOAL #5   Title sit for 45 mintues due to skin around the perineal and rectal area is healed from the radiation   Time 8   Period Weeks   Status Achieved               Plan - 05/16/17 1418    Clinical Impression Statement Patient is not ready for internal work due to pain level 6/10.  After the myofascial release to the urogenital diaphgram and pelvic floor diaphgram, the patient felt more relaxed and looser.  Patient is using Desitin for the skin in the vaginal area and it is helping.  Patient is only able to tolerate the q-tip in the vaginal area.  Patient will benefit from skilled therapy to improve fascial restrictions around the vagina from fibrotic skin.    Clinical Impairments Affecting Rehab Potential Extent of disease   PT Frequency Biweekly   PT Duration 8 weeks  4 months   PT Treatment/Interventions Biofeedback;Therapeutic activities;Therapeutic exercise;Neuromuscular re-education;Patient/family education;Passive range of motion;Scar mobilization;Manual techniques;Energy conservation   PT Next Visit Plan   soft tissue work to labia and internal,  lymph drainage; bulging of perineum   PT  Home Exercise Plan progress as needed   Recommended Other Services recert signed by MD   Consulted and Agree with Plan  of Care Patient      Patient will benefit from skilled therapeutic intervention in order to improve the following deficits and impairments:  Decreased coordination, Increased fascial restricitons, Decreased endurance, Increased muscle spasms, Decreased activity tolerance, Impaired flexibility, Decreased strength, Decreased mobility, Decreased scar mobility, Decreased skin integrity  Visit Diagnosis: Muscle weakness (generalized)  Cramp and spasm  Unspecified lack of coordination  Aftercare following surgery for neoplasm     Problem List Patient Active Problem List   Diagnosis Date Noted  . 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 Port catheter in place 11/21/2016  . 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 Anal cancer ([place]) 11/03/2016  . Cystitis 05/06/2015  . Degenerative cervical disc 04/08/2015  . Trigger point of neck 03/26/2015  . Pain in joint, ankle and foot 04/23/2013  . Visit for preventive health examination 02/13/2013  . Routine gynecological examination 02/13/2013  . Family history of breast cancer in first degree relative 02/13/2013  . Rash and nonspecific skin eruption 02/13/2013  . 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 VITAMIN D DEFICIENCY 06/25/2008  . NECK PAIN 06/02/2008  . FOOT PAIN 06/02/2008  . OSTEOPENIA 06/02/2008    Earlie Counts, PT 05/16/17 3:50 PM   [facility] [address] [place], [place], [address] Phone: [phone]   Fax:  [phone]  Name: LUIZA CARRANCO MRN: [id number] Date of Birth: 1951-04-19

## 2017-05-25 ENCOUNTER — Other Ambulatory Visit: Payer: Self-pay | Admitting: Internal Medicine

## 2017-05-30 ENCOUNTER — Encounter: Payer: Self-pay | Admitting: Physical Therapy

## 2017-05-30 ENCOUNTER — Ambulatory Visit: Payer: Medicare HMO | Attending: Radiation Oncology | Admitting: Physical Therapy

## 2017-05-30 DIAGNOSIS — M6281 Muscle weakness (generalized): Secondary | ICD-10-CM | POA: Diagnosis not present

## 2017-05-30 DIAGNOSIS — R279 Unspecified lack of coordination: Secondary | ICD-10-CM | POA: Diagnosis not present

## 2017-05-30 DIAGNOSIS — Z483 Aftercare following surgery for neoplasm: Secondary | ICD-10-CM | POA: Diagnosis not present

## 2017-05-30 DIAGNOSIS — R252 Cramp and spasm: Secondary | ICD-10-CM

## 2017-05-30 NOTE — Therapy (Signed)
[facility] [address], [place], [place], [address] Phone: [phone]   Fax:  [phone]  Physical Therapy Treatment  Patient Details  Name: Ashley Moore MRN: [id number] Date of Birth: Jun 16, 1951 Referring Provider: Hayden Pedro  Encounter Date: 05/30/2017      PT End of Session - 05/30/17 1600    Visit Number 10   Date for PT Re-Evaluation 06/21/17   Authorization Type [employer]; G-code 20th visit   PT Start Time 1500   PT Stop Time 1600   PT Time Calculation (min) 60 min   Activity Tolerance Patient tolerated treatment well   Behavior During Therapy [facility] for tasks assessed/performed      Past Medical History:  Diagnosis Date  . Bowen's disease    excised 1992  . Headache   . Hx of varicella   . 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([place])   . Thyroid cancer [facility])     Past Surgical History:  Procedure Laterality Date  . BUNIONECTOMY Right   . IR GENERIC HISTORICAL  11/14/2016   IR US GUIDE VASC ACCESS RIGHT 11/14/2016 [facility]  . IR GENERIC HISTORICAL  11/14/2016   IR FLUORO GUIDE CV LINE RIGHT 11/14/2016 [facility]  . IR GENERIC HISTORICAL  12/12/2016   IR US GUIDE VASC ACCESS RIGHT 12/12/2016 [name], MD [facility]  . IR GENERIC HISTORICAL  12/12/2016   IR FLUORO GUIDE CV LINE RIGHT 12/12/2016 [name], MD [facility]  . SKIN CANCER EXCISION     bowens disease    There were no vitals filed for this visit.      Subjective Assessment - 05/30/17 1457    Subjective since the barometric pressuer went down I have trouble moving. My vagina is very sensitive and I was able place my indecx finger into the vagina.  I had to use alot of lube. I have been using the q-tip.  I have an appointment with Dr. Tomi Likens for my neck.  10% less mucous coming out of the rectum.    Pertinent History Diagnosed with anal cancer 6 weeks ago but reports being misdiagnosed by 3 doctors over the past year as having  hemorrhoids. They began chemo and radiation simultaneously about a month ago. 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   How long can you sit comfortably? Few minutes   Patient Stated Goals improve fecal leakage, improve the vaginal opening to improve urine stream   Currently in Pain? No/denies            [facility] PT Assessment - 05/30/17 0001      Assessment   Medical Diagnosis C21.0 anal cancer; c79.82 Secondary malignant neoplam of vulva   Onset Date/Surgical Date 11/26/16   Prior Therapy none     Precautions   Precautions Other (comment)   Precaution Comments Cancer     Restrictions   Weight Bearing Restrictions No     Home Environment   Living Environment Private residence     Prior Function   Level of Independence Independent   Vocation Full time employment   Vocation Requirements standing     Cognition   Overall Cognitive Status Within Functional Limits for tasks assessed     Observation/Other Assessments   Focus on Therapeutic Outcomes (FOTO)  60% limitation     Posture/Postural Control   Posture/Postural Control No significant limitations     AROM   Overall AROM Comments Lumbar ROM is full     Strength  Overall Strength Comments bil. hip abduction 4/5; abdominal strength is 2/5                  Pelvic Floor Special Questions - 05/30/17 0001    Pelvic Floor Internal Exam Patient confirms identification and approves PT to assess the perineal and muscle integrity   Exam Type Vaginal   Palpation able to place therapist index finger into the introitus with room and pain level 3/10; myofascial release to the introitus with pulsating feel   Strength good squeeze, good lift, able to hold agaisnt strong resistance           [facility] Adult PT Treatment/Exercise - 05/30/17 0001      Manual Therapy   Manual Therapy Myofascial release;Internal Pelvic Floor   Myofascial Release release along the labia majoria and minora;    Internal Pelvic Floor  able to perfrom gentle soft tissue work with pelvic floor muscles 3 inches deep for first time; release with one finger on the inner wall and the other on the outter wall;                   PT Short Term Goals - 05/02/17 1408      PT SHORT TERM GOAL #1   Title independent with initial HEP   Time 4   Period Weeks   Status Achieved     PT SHORT TERM GOAL #2   Title understand on how to use the dilator with movement and how to progress   Time 4   Period Weeks   Status Achieved     PT SHORT TERM GOAL #3   Title understand how to do self perineal massage to separate the labia minora   Time 4   Period Weeks   Status Achieved     PT SHORT TERM GOAL #4   Title fecal leakage decreased >/= 25% due to improved pelvic floor muscle control   Time 4   Period Weeks   Status Achieved           PT Long Term Goals - 05/30/17 1458      PT LONG TERM GOAL #1   Title independent with HEP   Time 8   Period Weeks   Status On-going     PT LONG TERM GOAL #2   Title ability to use the largest size dilator and understand she will have to use it for 2 years   Baseline using the index finger   Time 8   Period Weeks   Status On-going     PT LONG TERM GOAL #4   Title fecal incontinence decreased >/= 75% due to increase control of pelvic floor muscles   Baseline when she eats cucumbers and peaches; went through 2 pairs of pants today.  Patient has alot of mucous coming out   Time 8   Period Weeks   Status On-going     PT LONG TERM GOAL #5   Title sit for 45 mintues due to skin around the perineal and rectal area is healed from the radiation   Time 8   Period Weeks   Status Achieved     PT LONG TERM GOAL #6   Title FOTO score is </= 50% limitation   Time 8   Status On-going               Plan - 05/30/17 1504    Clinical Impression Statement Patient is able to bulge the perineum. Patient was  able to tolerate therapist using her index finger in the introitus 3  inches upward for first time.  Patient had several releases in the tissue while performing the myofascial release.  Patient reports 10% less of mucous coming out of the rectum.  Patient pelvic floor strength increased to 4/5 for 2 seconds and has a good lift.  Patient continues with using the q-tip at home and her index finger.  She will attempt the dilator again.  Patient will benfit from skilled therapy to improve fascial restrictions around the vagina from fibrotic radiated skin.    Rehab Potential Good   Clinical Impairments Affecting Rehab Potential Extent of disease   PT Frequency Biweekly   PT Duration 8 weeks   PT Treatment/Interventions Biofeedback;Therapeutic activities;Therapeutic exercise;Neuromuscular re-education;Patient/family education;Passive range of motion;Scar mobilization;Manual techniques;Energy conservation   PT Next Visit Plan   soft tissue work to labia and internal,  lymph drainage; bulging of perineum   PT Home Exercise Plan progress as needed   Consulted and Agree with Plan of Care Patient      Patient will benefit from skilled therapeutic intervention in order to improve the following deficits and impairments:  Decreased coordination, Increased fascial restricitons, Decreased endurance, Increased muscle spasms, Decreased activity tolerance, Impaired flexibility, Decreased strength, Decreased mobility, Decreased scar mobility, Decreased skin integrity  Visit Diagnosis: Muscle weakness (generalized)  Cramp and spasm  Unspecified lack of coordination  Aftercare following surgery for neoplasm       G-Codes - 06/14/17 1606    Functional Assessment Tool Used (Outpatient Only) FOTO score is 60% limitation   Functional Limitation Other PT primary   Other PT Primary Current Status (V6720) At least 60 percent but less than 80 percent impaired, limited or restricted   Other PT Primary Goal Status (N4709) At least 40 percent but less than 60 percent impaired, limited or  restricted      Problem List Patient Active Problem List   Diagnosis Date Noted  . 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 Port catheter in place 11/21/2016  . 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 Anal cancer ([place]) 11/03/2016  . Cystitis 05/06/2015  . Degenerative cervical disc 04/08/2015  . Trigger point of neck 03/26/2015  . Pain in joint, ankle and foot 04/23/2013  . Visit for preventive health examination 02/13/2013  . Routine gynecological examination 02/13/2013  . Family history of breast cancer in first degree relative 02/13/2013  . Rash and nonspecific skin eruption 02/13/2013  . 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 VITAMIN D DEFICIENCY 06/25/2008  . NECK PAIN 06/02/2008  . FOOT PAIN 06/02/2008  . OSTEOPENIA 06/02/2008    Earlie Counts, PT 06/14/17 4:08 PM   [facility] [address], [place], [place], [address] Phone: [phone]   Fax:  [phone]  Name: Ashley Moore MRN: [id number] Date of Birth: [birth year]

## 2017-06-02 ENCOUNTER — Ambulatory Visit: Payer: Self-pay | Admitting: Neurology

## 2017-06-07 ENCOUNTER — Encounter: Payer: Self-pay | Admitting: Physical Therapy

## 2017-06-07 ENCOUNTER — Ambulatory Visit: Payer: Medicare HMO | Admitting: Physical Therapy

## 2017-06-07 DIAGNOSIS — R279 Unspecified lack of coordination: Secondary | ICD-10-CM

## 2017-06-07 DIAGNOSIS — Z483 Aftercare following surgery for neoplasm: Secondary | ICD-10-CM | POA: Diagnosis not present

## 2017-06-07 DIAGNOSIS — M6281 Muscle weakness (generalized): Secondary | ICD-10-CM | POA: Diagnosis not present

## 2017-06-07 DIAGNOSIS — R252 Cramp and spasm: Secondary | ICD-10-CM | POA: Diagnosis not present

## 2017-06-07 NOTE — Therapy (Signed)
[facility] [address], [place], [place], [address] Phone: [phone]   Fax:  [phone]  Physical Therapy Treatment  Patient Details  Name: Ashley Moore MRN: [id number] Date of Birth: 24-Dec-1950 Referring Provider: Hayden Pedro  Encounter Date: 06/07/2017      PT End of Session - 06/07/17 1624    Visit Number 11   Date for PT Re-Evaluation 06/21/17   Authorization Type [employer]; G-code 20th visit   PT Start Time 1615   PT Stop Time 1655   PT Time Calculation (min) 40 min   Activity Tolerance Patient tolerated treatment well   Behavior During Therapy [facility] for tasks assessed/performed      Past Medical History:  Diagnosis Date  . Bowen's disease    excised 1992  . Headache   . Hx of varicella   . 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([place])   . Thyroid cancer [facility])     Past Surgical History:  Procedure Laterality Date  . BUNIONECTOMY Right   . IR GENERIC HISTORICAL  11/14/2016   IR US GUIDE VASC ACCESS RIGHT 11/14/2016 [facility]  . IR GENERIC HISTORICAL  11/14/2016   IR FLUORO GUIDE CV LINE RIGHT 11/14/2016 [facility]  . IR GENERIC HISTORICAL  12/12/2016   IR US GUIDE VASC ACCESS RIGHT 12/12/2016 [name], MD [facility]  . IR GENERIC HISTORICAL  12/12/2016   IR FLUORO GUIDE CV LINE RIGHT 12/12/2016 [name], MD [facility]  . SKIN CANCER EXCISION     bowens disease    There were no vitals filed for this visit.      Subjective Assessment - 06/07/17 1619    Subjective I had pain last week due to the change in weather.  I have not been using the desitin to air out the area.    Pertinent History Diagnosed with anal cancer 6 weeks ago but reports being misdiagnosed by 3 doctors over the past year as having hemorrhoids. They began chemo and radiation simultaneously about a month ago. 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   How long can you sit comfortably? Few minutes   Patient Stated Goals  improve fecal leakage, improve the vaginal opening to improve urine stream   Currently in Pain? No/denies   Multiple Pain Sites No                      Pelvic Floor Special Questions - 06/07/17 0001    Pelvic Floor Internal Exam Patient confirms identification and approves PT to assess the perineal and muscle integrity   Exam Type Vaginal   Palpation able to place 2 finger into the  introitus           [facility] Adult PT Treatment/Exercise - 06/07/17 0001      Manual Therapy   Manual Therapy Internal Pelvic Floor   Internal Pelvic Floor soft tissue work to the introitus with one finger then stretch the introitus with 2 fingers usint diaphgramatic breathing                  PT Short Term Goals - 05/02/17 1408      PT SHORT TERM GOAL #1   Title independent with initial HEP   Time 4   Period Weeks   Status Achieved     PT SHORT TERM GOAL #2   Title understand on how to use the dilator with movement and how to progress   Time 4  Period Weeks   Status Achieved     PT SHORT TERM GOAL #3   Title understand how to do self perineal massage to separate the labia minora   Time 4   Period Weeks   Status Achieved     PT SHORT TERM GOAL #4   Title fecal leakage decreased >/= 25% due to improved pelvic floor muscle control   Time 4   Period Weeks   Status Achieved           PT Long Term Goals - 06/07/17 1659      PT LONG TERM GOAL #1   Title independent with HEP   Period Weeks   Status On-going     PT LONG TERM GOAL #2   Title ability to use the largest size dilator and understand she will have to use it for 2 years   Baseline 2 index fingers   Time 8   Period Weeks   Status On-going     PT LONG TERM GOAL #3   Time 8   Period Weeks   Status Deferred     PT LONG TERM GOAL #4   Title fecal incontinence decreased >/= 75% due to increase control of pelvic floor muscles   Baseline when she eats cucumbers and peaches; went through 2 pairs of  pants today.  Patient has alot of mucous coming out   Time 8   Period Weeks   Status On-going     PT LONG TERM GOAL #5   Title sit for 45 mintues due to skin around the perineal and rectal area is healed from the radiation   Time 8   Period Weeks   Status Achieved               Plan - 06/07/17 1625    Clinical Impairments Affecting Rehab Potential --      Patient will benefit from skilled therapeutic intervention in order to improve the following deficits and impairments:  Decreased coordination, Increased fascial restricitons, Decreased endurance, Increased muscle spasms, Decreased activity tolerance, Impaired flexibility, Decreased strength, Decreased mobility, Decreased scar mobility, Decreased skin integrity  Visit Diagnosis: Muscle weakness (generalized)  Cramp and spasm  Unspecified lack of coordination  Aftercare following surgery for neoplasm     Problem List Patient Active Problem List   Diagnosis Date Noted  . 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 Port catheter in place 11/21/2016  . 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 Anal cancer ([place]) 11/03/2016  . Cystitis 05/06/2015  . Degenerative cervical disc 04/08/2015  . Trigger point of neck 03/26/2015  . Pain in joint, ankle and foot 04/23/2013  . Visit for preventive health examination 02/13/2013  . Routine gynecological examination 02/13/2013  . Family history of breast cancer in first degree relative 02/13/2013  . Rash and nonspecific skin eruption 02/13/2013  . 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 VITAMIN D DEFICIENCY 06/25/2008  . NECK PAIN 06/02/2008  . FOOT PAIN 06/02/2008  . OSTEOPENIA 06/02/2008    Earlie Counts, PT 06/07/17 5:01 PM   [facility] [address] [place], [place], [address] Phone: [phone]   Fax:  [phone]  Name: VIRJEAN BOMAN MRN: [id number] Date of Birth: Mar 03, 1951

## 2017-06-09 ENCOUNTER — Encounter: Payer: Self-pay | Admitting: Internal Medicine

## 2017-06-14 ENCOUNTER — Ambulatory Visit: Payer: Medicare HMO | Admitting: Physical Therapy

## 2017-06-14 ENCOUNTER — Encounter: Payer: Self-pay | Admitting: Physical Therapy

## 2017-06-14 DIAGNOSIS — R252 Cramp and spasm: Secondary | ICD-10-CM | POA: Diagnosis not present

## 2017-06-14 DIAGNOSIS — M6281 Muscle weakness (generalized): Secondary | ICD-10-CM

## 2017-06-14 DIAGNOSIS — Z483 Aftercare following surgery for neoplasm: Secondary | ICD-10-CM | POA: Diagnosis not present

## 2017-06-14 DIAGNOSIS — R279 Unspecified lack of coordination: Secondary | ICD-10-CM

## 2017-06-14 NOTE — Therapy (Signed)
[facility] [address], [place], [place], [address] Phone: [phone]   Fax:  [phone]  Physical Therapy Treatment  Patient Details  Name: Ashley Moore MRN: [id number] Date of Birth: 26-Mar-1951 Referring Provider: Hayden Pedro  Encounter Date: 06/14/2017      PT End of Session - 06/14/17 1705    Visit Number 12   Number of Visits 12   Date for PT Re-Evaluation 08/16/17   Authorization Type [employer]; G-code 20th visit   PT Start Time 1615   PT Stop Time 1700   PT Time Calculation (min) 45 min   Activity Tolerance Patient tolerated treatment well   Behavior During Therapy [facility] for tasks assessed/performed      Past Medical History:  Diagnosis Date  . Bowen's disease    excised 1992  . Headache   . Hx of varicella   . 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([place])   . Thyroid cancer [facility])     Past Surgical History:  Procedure Laterality Date  . BUNIONECTOMY Right   . IR GENERIC HISTORICAL  11/14/2016   IR US GUIDE VASC ACCESS RIGHT 11/14/2016 [facility]  . IR GENERIC HISTORICAL  11/14/2016   IR FLUORO GUIDE CV LINE RIGHT 11/14/2016 [facility]  . IR GENERIC HISTORICAL  12/12/2016   IR US GUIDE VASC ACCESS RIGHT 12/12/2016 [name], MD [facility]  . IR GENERIC HISTORICAL  12/12/2016   IR FLUORO GUIDE CV LINE RIGHT 12/12/2016 [name], MD [facility]  . SKIN CANCER EXCISION     bowens disease    There were no vitals filed for this visit.      Subjective Assessment - 06/14/17 1610    Subjective I am at the small size dilator and feel a little raw in the vagina. The mucous from the anus is decreased.    Pertinent History Diagnosed with anal cancer 6 weeks ago but reports being misdiagnosed by 3 doctors over the past year as having hemorrhoids. They began chemo and radiation simultaneously about a month ago. 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   How long can you sit comfortably? Few minutes   Patient Stated Goals improve fecal leakage, improve the vaginal opening to improve urine stream   Currently in Pain? No/denies   Multiple Pain Sites No            [facility] PT Assessment - 06/14/17 0001      Assessment   Medical Diagnosis C21.0 anal cancer; c79.82 Secondary malignant neoplam of vulva   Onset Date/Surgical Date 11/26/16   Prior Therapy none     Precautions   Precautions Other (comment)   Precaution Comments Cancer     Restrictions   Weight Bearing Restrictions No     Balance Screen   Has the patient fallen in the past 6 months No   Has the patient had a decrease in activity level because of a fear of falling?  No   Is the patient reluctant to leave their home because of a fear of falling?  No     Home Ecologist residence     Prior Function   Level of Independence Independent   Vocation Full time employment   Vocation Requirements standing     Cognition   Overall Cognitive Status Within Functional Limits for tasks assessed     Observation/Other Assessments   Focus on Therapeutic Outcomes (FOTO)  60% limitation     Posture/Postural Control  Posture/Postural Control No significant limitations     AROM   Overall AROM Comments Lumbar ROM is full     Strength   Overall Strength Comments bil. hip abduction 4/5; abdominal strength is 2/5                  Pelvic Floor Special Questions - 06/14/17 0001    Pelvic Floor Internal Exam Patient confirms identification and approves PT to assess the perineal and muscle integrity   Exam Type Vaginal   Palpation able to place 2 finger into the  introitus; place finger in 3 inches; left side more sensitive than the right   Strength good squeeze, good lift, able to hold agaisnt strong resistance           [facility] Adult PT Treatment/Exercise - 06/14/17 0001      Self-Care   Self-Care Other Self-Care Comments   Other Self-Care Comments  how to use a prickly roller to the  hip adductors, quads, and hamstring to release the muscle tissue     Manual Therapy   Manual Therapy Internal Pelvic Floor;Manual Lymphatic Drainage (MLD)   Soft tissue mobilization bil. hip adductor and quads   Myofascial Release myofascial release on bil. sides of the urethra with gentle release of the bladder; release of bil. sides of the pubic ramen   Manual Lymphatic Drainage (MLD) bil. inner thigh and lower abdomen   Internal Pelvic Floor soft tissue work to the introitus very gently; release on bil. sides of the urethra                PT Education - 06/14/17 1704    Education provided Yes   Education Details educated on how to use a rolling prickly ball to massage the thigh muscles   Person(s) Educated Patient   Methods Explanation;Demonstration;Verbal cues;Handout   Comprehension Returned demonstration;Verbalized understanding          PT Short Term Goals - 05/02/17 1408      PT SHORT TERM GOAL #1   Title independent with initial HEP   Time 4   Period Weeks   Status Achieved     PT SHORT TERM GOAL #2   Title understand on how to use the dilator with movement and how to progress   Time 4   Period Weeks   Status Achieved     PT SHORT TERM GOAL #3   Title understand how to do self perineal massage to separate the labia minora   Time 4   Period Weeks   Status Achieved     PT SHORT TERM GOAL #4   Title fecal leakage decreased >/= 25% due to improved pelvic floor muscle control   Time 4   Period Weeks   Status Achieved           PT Long Term Goals - 06/07/17 1659      PT LONG TERM GOAL #1   Title independent with HEP   Period Weeks   Status On-going     PT LONG TERM GOAL #2   Title ability to use the largest size dilator and understand she will have to use it for 2 years   Baseline 2 index fingers   Time 8   Period Weeks   Status On-going     PT LONG TERM GOAL #3   Time 8   Period Weeks   Status Deferred     PT LONG TERM GOAL #4    Title fecal incontinence  decreased >/= 75% due to increase control of pelvic floor muscles   Baseline when she eats cucumbers and peaches; went through 2 pairs of pants today.  Patient has alot of mucous coming out   Time 8   Period Weeks   Status On-going     PT LONG TERM GOAL #5   Title sit for 45 mintues due to skin around the perineal and rectal area is healed from the radiation   Time 8   Period Weeks   Status Achieved               Plan - 06/14/17 1616    Clinical Impression Statement Patient responded to the soft tissue work and myofascial release.  Patient is now able to use the small dialtor for first time in the month. Patient has restrictions around the urethra. Patient has tight hips due to the affect from the radiation.  Patient has spraying when she urinated due to the labia fusion together and covering the urethra.  Patient labia are larger than normal.  Patient leakage of muscous is declining over time.  Patient  floor strength is 4/5.  Patient will benefit from skilled therapy to improve fascial restrictions around the vagina from the fibrotic radiated skin.    Rehab Potential Good   Clinical Impairments Affecting Rehab Potential Extent of disease   PT Frequency Biweekly   PT Duration 8 weeks   PT Treatment/Interventions Biofeedback;Therapeutic activities;Therapeutic exercise;Neuromuscular re-education;Patient/family education;Passive range of motion;Scar mobilization;Manual techniques;Energy conservation   PT Next Visit Plan   soft tissue work to labia and internal,  lymph drainage; bulging of perineum   PT Home Exercise Plan progress as needed   Consulted and Agree with Plan of Care Patient      Patient will benefit from skilled therapeutic intervention in order to improve the following deficits and impairments:  Decreased coordination, Increased fascial restricitons, Decreased endurance, Increased muscle spasms, Decreased activity tolerance, Impaired flexibility,  Decreased strength, Decreased mobility, Decreased scar mobility, Decreased skin integrity  Visit Diagnosis: Muscle weakness (generalized) - Plan: PT plan of care cert/re-cert  Cramp and spasm - Plan: PT plan of care cert/re-cert  Unspecified lack of coordination - Plan: PT plan of care cert/re-cert  Aftercare following surgery for neoplasm - Plan: PT plan of care cert/re-cert     Problem List Patient Active Problem List   Diagnosis Date Noted  . 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 Port catheter in place 11/21/2016  . 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 Anal cancer ([place]) 11/03/2016  . Cystitis 05/06/2015  . Degenerative cervical disc 04/08/2015  . Trigger point of neck 03/26/2015  . Pain in joint, ankle and foot 04/23/2013  . Visit for preventive health examination 02/13/2013  . Routine gynecological examination 02/13/2013  . Family history of breast cancer in first degree relative 02/13/2013  . Rash and nonspecific skin eruption 02/13/2013  . 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 VITAMIN D DEFICIENCY 06/25/2008  . NECK PAIN 06/02/2008  . FOOT PAIN 06/02/2008  . OSTEOPENIA 06/02/2008    Earlie Counts, PT 06/14/17 5:18 PM   [facility] [address], [place], [place], [address] Phone: [phone]   Fax:  [phone]  Name: Ashley Moore MRN: [id number] Date of Birth: [birth year]

## 2017-06-19 ENCOUNTER — Ambulatory Visit: Payer: Self-pay | Admitting: Neurology

## 2017-06-20 NOTE — Progress Notes (Signed)
Chief Complaint  Patient presents with  . Follow-up    Pt states she has been doing better     HPI: Ashley Moore 66 y.o. come in for FU   Med for reactive mood  See last notes 2 66 Feels med has been helpful smoothing things out and  Getting through no sog se.  Stressful last week.  And doing well  averiung 9 hours a night   Without alarm clock .   Pt  Doing once a week   Gyne  Fu   Would like to stay on med  No sid depression   And anxiety stable  ROS: See pertinent positives and negatives per HPI.  Past Medical History:  Diagnosis Date  . Bowen's disease    excised 1992  . Headache   . Hx of varicella   . 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([place])   . Thyroid cancer ([place])     Family History  Problem Relation Age of Onset  . Lung cancer Mother        lung  . Pancreatitis Father   . Breast cancer Sister   . Breast cancer Maternal Grandmother        breast   . Breast cancer Maternal Aunt        breast  . Melanoma Sister     Social History   Social History  . Marital status: Widowed    Spouse name: N/A  . Number of children: 0  . Years of education: N/A   Social History Main Topics  . Smoking status: Former Research scientist (life sciences)  . Smokeless tobacco: Former Systems developer    Quit date: 09/20/1995  . Alcohol use 0.0 oz/week     Comment: occasional  . Drug use: Yes    Types: Marijuana  . Sexual activity: No   Other Topics Concern  . None   Social History Narrative   H H  of 1      5 pets.   She is a former smoker   Retired Engineer, mining fine arts   Husband smokes he has active heart disease and vascular disease   etoh   Red wine  1 per night.    Tea green tea and earl gray    Moved from [place] to [place] area in 1983-12-21   1 pregnancy   Husband died spring  2016 cv   Sister died 03-23-2015  Bone cancer                 Outpatient Medications Prior to Visit  Medication Sig Dispense Refill  . conjugated estrogens (PREMARIN) vaginal cream Place 1 Applicatorful  vaginally at bedtime. Once twice a week    . gabapentin (NEURONTIN) 100 MG capsule TAKE 1 CAPSULE THREE TIMES DAILY (Patient taking differently: Take 2 at night) 270 capsule 3  . meloxicam (MOBIC) 15 MG tablet TAKE 1 TABLET EVERY DAY 90 tablet 0  . tiZANidine (ZANAFLEX) 4 MG tablet TAKE 1 TABLET EVERY 8 HOURS AS NEEDED FOR MUSCLE SPASM(S) 90 tablet 5  . sertraline (ZOLOFT) 50 MG tablet TAKE 1/2 TABLET BY MOUTH ONCE A DAY FOR 1 WEEK THEN 1 TABLET PER DAY 30 tablet 3   No facility-administered medications prior to visit.      EXAM:  BP 118/82 (BP Location: Right Arm, Patient Position: Sitting, Cuff Size: Normal)   Pulse 69   Temp 98.3 F (36.8 C) (Oral)   Ht 5\' 2"  (1.575 m)   Wt 175 lb 9.6 oz (  79.7 kg)   SpO2 99%   BMI 32.12 kg/m   Body mass index is 32.12 kg/m.  GENERAL: vitals reviewed and listed above, alert, oriented, appears well hydrated and in no acute distress HEENT: atraumatic, conjunctiva  clear, no obvious abnormalities on inspection of external nose and ears NECK: no obvious masses on inspection palpation  MS: moves all extremities without noticeable focal  abnormality PSYCH: pleasant and cooperative, no obvious depression or anxiety Lab Results  Component Value Date   WBC 4.3 12/27/2016   HGB 10.3 (L) 12/27/2016   HCT 31.1 (L) 12/27/2016   PLT 158 12/27/2016   GLUCOSE 84 04/13/2017   CHOL 165 03/11/2015   TRIG 66.0 03/11/2015   HDL 67.30 03/11/2015   LDLCALC 85 03/11/2015   ALT 43 12/27/2016   AST 57 (H) 12/27/2016   NA 138 04/13/2017   K 4.7 04/13/2017   CL 94 (L) 12/27/2016   CREATININE 0.8 04/13/2017   BUN 18.8 04/13/2017   CO2 25 04/13/2017   TSH 4.09 10/21/2015   HGBA1C 5.5 05/11/2015   BP Readings from Last 3 Encounters:  06/21/17 118/82  05/09/17 102/70  04/20/17 (!) 150/81    ASSESSMENT AND PLAN:  Discussed the following assessment and plan:  Adjustment reaction with anxiety and depression - much improved feels even and normal for her.  Benefit more than risk continue medication until seen 6 months preventive visit CPX or as needed. Refill until next  Medication management  Need for prophylactic vaccination and inoculation against influenza Discussed shingles expect vaccine printed for patient can consider getting it in the future she did have the Zostavax in the past. Reviewed immunizations with patient. Upon her questioning pneumonia vaccines.  -Patient advised to return or notify health care team  if  new concerns arise.  Patient Instructions  Glad you are   doing better Stay on the sertraline   Sent in refills .  cpx in 6 mos and med check at that time.  Consider shingrix vaccine.    Standley Brooking. Daruis Swaim M.D.

## 2017-06-21 ENCOUNTER — Ambulatory Visit (INDEPENDENT_AMBULATORY_CARE_PROVIDER_SITE_OTHER): Payer: Medicare HMO | Admitting: Internal Medicine

## 2017-06-21 ENCOUNTER — Encounter: Payer: Self-pay | Admitting: Internal Medicine

## 2017-06-21 VITALS — BP 118/82 | HR 69 | Temp 98.3°F | Ht 62.0 in | Wt 175.6 lb

## 2017-06-21 DIAGNOSIS — Z23 Encounter for immunization: Secondary | ICD-10-CM | POA: Diagnosis not present

## 2017-06-21 DIAGNOSIS — F4323 Adjustment disorder with mixed anxiety and depressed mood: Secondary | ICD-10-CM | POA: Diagnosis not present

## 2017-06-21 DIAGNOSIS — Z79899 Other long term (current) drug therapy: Secondary | ICD-10-CM

## 2017-06-21 MED ORDER — ZOSTER VAC RECOMB ADJUVANTED 50 MCG/0.5ML IM SUSR: 0.5000 mL | Freq: Once | INTRAMUSCULAR | 1 refills | Status: AC

## 2017-06-21 MED ORDER — SERTRALINE HCL 50 MG PO TABS: 50.0000 mg | ORAL_TABLET | Freq: Every day | ORAL | 1 refills | Status: DC

## 2017-06-21 NOTE — Addendum Note (Signed)
Addended by: Benson Setting L on: 06/21/2017 02:00 PM   Modules accepted: Orders

## 2017-06-21 NOTE — Patient Instructions (Addendum)
Glad you are   doing better Stay on the sertraline   Sent in refills .  cpx in 6 mos and med check at that time.  Consider shingrix vaccine.

## 2017-06-22 ENCOUNTER — Encounter: Payer: Self-pay | Admitting: Physical Therapy

## 2017-06-22 ENCOUNTER — Ambulatory Visit: Payer: Medicare HMO | Attending: Radiation Oncology | Admitting: Physical Therapy

## 2017-06-22 DIAGNOSIS — Z483 Aftercare following surgery for neoplasm: Secondary | ICD-10-CM | POA: Insufficient documentation

## 2017-06-22 DIAGNOSIS — M6281 Muscle weakness (generalized): Secondary | ICD-10-CM | POA: Diagnosis not present

## 2017-06-22 DIAGNOSIS — R279 Unspecified lack of coordination: Secondary | ICD-10-CM

## 2017-06-22 DIAGNOSIS — R252 Cramp and spasm: Secondary | ICD-10-CM | POA: Diagnosis not present

## 2017-06-22 NOTE — Therapy (Signed)
[facility] [address] [place], [place], [address] Phone: [phone]   Fax:  [phone]  Physical Therapy Treatment  Patient Details  Name: Ashley Moore MRN: [id number] Date of Birth: 1950/11/20 Referring Provider: Hayden Pedro  Encounter Date: 06/22/2017      PT End of Session - 06/22/17 1619    Visit Number 13   Date for PT Re-Evaluation 08/16/17   Authorization Type [employer]; G-code 20th visit   PT Start Time 1615   PT Stop Time 1655   PT Time Calculation (min) 40 min   Activity Tolerance Patient tolerated treatment well   Behavior During Therapy [facility] for tasks assessed/performed      Past Medical History:  Diagnosis Date  . Bowen's disease    excised 1992  . Headache   . Hx of varicella   . 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([place])   . Thyroid cancer [facility])     Past Surgical History:  Procedure Laterality Date  . BUNIONECTOMY Right   . IR GENERIC HISTORICAL  11/14/2016   IR US GUIDE VASC ACCESS RIGHT 11/14/2016 [facility]  . IR GENERIC HISTORICAL  11/14/2016   IR FLUORO GUIDE CV LINE RIGHT 11/14/2016 [facility]  . IR GENERIC HISTORICAL  12/12/2016   IR US GUIDE VASC ACCESS RIGHT 12/12/2016 [name], MD [facility]  . IR GENERIC HISTORICAL  12/12/2016   IR FLUORO GUIDE CV LINE RIGHT 12/12/2016 [name], MD [facility]  . SKIN CANCER EXCISION     bowens disease    There were no vitals filed for this visit.      Subjective Assessment - 06/22/17 1616    Subjective I was raw last Tuesday after my home therapy.    Pertinent History Diagnosed with anal cancer 6 weeks ago but reports being misdiagnosed by 3 doctors over the past year as having hemorrhoids. They began chemo and radiation simultaneously about a month ago. 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   How long can you sit comfortably? Few minutes   Patient Stated Goals improve fecal leakage, improve the vaginal opening to improve  urine stream   Currently in Pain? No/denies   Multiple Pain Sites No                      Pelvic Floor Special Questions - 06/22/17 0001    Pelvic Floor Internal Exam Patient confirms identification and approves PT to assess the perineal and muscle integrity   Exam Type Vaginal   Palpation able to bulge the perineum; area on the left levator ani was very sensitive and placed desert harvest releevum on it; patient is able to contract and  bulge   Strength good squeeze, good lift, able to hold agaisnt strong resistance           [facility] Adult PT Treatment/Exercise - 06/22/17 0001      Manual Therapy   Manual Therapy Internal Pelvic Floor   Internal Pelvic Floor soft tissue work to the introitus very gently; release on bil. sides of the urethra; bil. side of the rectum, bil. levator ani and obturator internist                PT Education - 06/22/17 [place]    Education provided Yes   Education Details gave patient samples of [employer]) Educated Patient   Methods Explanation   Comprehension Verbalized understanding  PT Short Term Goals - 05/02/17 1408      PT SHORT TERM GOAL #1   Title independent with initial HEP   Time 4   Period Weeks   Status Achieved     PT SHORT TERM GOAL #2   Title understand on how to use the dilator with movement and how to progress   Time 4   Period Weeks   Status Achieved     PT SHORT TERM GOAL #3   Title understand how to do self perineal massage to separate the labia minora   Time 4   Period Weeks   Status Achieved     PT SHORT TERM GOAL #4   Title fecal leakage decreased >/= 25% due to improved pelvic floor muscle control   Time 4   Period Weeks   Status Achieved           PT Long Term Goals - 06/22/17 1617      PT LONG TERM GOAL #1   Title independent with HEP   Time 8   Period Weeks   Status On-going     PT LONG TERM GOAL #2   Title ability to use the largest size  dilator and understand she will have to use it for 2 years   Baseline largest small   Time 8   Period Weeks   Status On-going     PT LONG TERM GOAL #3   Title urine stream able to go straight and not have to clean herself due to spraying of urine   Time 8   Period Weeks   Status Deferred     PT LONG TERM GOAL #4   Title fecal incontinence decreased >/= 75% due to increase control of pelvic floor muscles   Baseline improved by 75% better and leak one time every 2 weeks   Time 8   Period Weeks   Status On-going     PT LONG TERM GOAL #5   Title sit for 45 mintues due to skin around the perineal and rectal area is healed from the radiation   Time 8   Period Weeks   Status Achieved     PT LONG TERM GOAL #6   Title FOTO score is </= 50% limitation   Time 8   Period Weeks   Status On-going               Plan - 06/22/17 1619    Clinical Impression Statement Patient is able to contract and bulge the perineum fully. She has a very sensitive spot on the left levator ani area.  Patient is using the small dilator and is ready for the medium.  Patient has softness on the mons pubis.  Patient will benefit from skilled theraoy to improve fascial restrictions around the vagina from the fibrotic radiated skin.    Rehab Potential Good   Clinical Impairments Affecting Rehab Potential Extent of disease   PT Frequency Biweekly   PT Duration 8 weeks   PT Treatment/Interventions Biofeedback;Therapeutic activities;Therapeutic exercise;Neuromuscular re-education;Patient/family education;Passive range of motion;Scar mobilization;Manual techniques;Energy conservation   PT Next Visit Plan   soft tissue work to labia and internal,  lymph drainage; bulging of perineum   PT Home Exercise Plan progress as needed   Consulted and Agree with Plan of Care Patient      Patient will benefit from skilled therapeutic intervention in order to improve the following deficits and impairments:  Decreased  coordination, Increased fascial restricitons, Decreased endurance,  Increased muscle spasms, Decreased activity tolerance, Impaired flexibility, Decreased strength, Decreased mobility, Decreased scar mobility, Decreased skin integrity  Visit Diagnosis: Muscle weakness (generalized)  Cramp and spasm  Unspecified lack of coordination     Problem List Patient Active Problem List   Diagnosis Date Noted  . 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 Port catheter in place 11/21/2016  . 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 Anal cancer ([place]) 11/03/2016  . Cystitis 05/06/2015  . Degenerative cervical disc 04/08/2015  . Trigger point of neck 03/26/2015  . Pain in joint, ankle and foot 04/23/2013  . Visit for preventive health examination 02/13/2013  . Routine gynecological examination 02/13/2013  . Family history of breast cancer in first degree relative 02/13/2013  . Rash and nonspecific skin eruption 02/13/2013  . 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 VITAMIN D DEFICIENCY 06/25/2008  . NECK PAIN 06/02/2008  . FOOT PAIN 06/02/2008  . OSTEOPENIA 06/02/2008    Earlie Counts, PT 06/22/17 4:57 PM   [facility] [address] [place], [place], [address] Phone: [phone]   Fax:  [phone]  Name: LANISSA CASHEN MRN: [id number] Date of Birth: 1951-07-06

## 2017-06-28 ENCOUNTER — Encounter: Payer: Self-pay | Admitting: Physical Therapy

## 2017-06-28 NOTE — Progress Notes (Addendum)
Subjective:   Ashley Moore is a 66 y.o. female who presents for an Initial Medicare Annual Wellness Visit.  The Patient was informed that the wellness visit is to identify future health risk and educate and initiate measures that can reduce risk for increased disease through the lifespan.    Annual Wellness Assessment  Reports health as getting better from rectal cancer  States the doctors were great   Preventive Screening -Counseling & Management  Medicare Annual Preventive Care Visit - Subsequent Last OV was on 10/3  In outpatient rehab for muscle weakness     Colonoscopy 2/217 Mammogram 09/2015; cancer md stated to wait this year due to all the chemo she had   There are no preventive care reminders to display for this patient. Mammogram is due (sister had breast cancer and MGM)  Bone density  Oncologist stated she can have a mammogram next year as she is very low risk for cancer due to the chemo she has had   Will await seeing Dr. Regis Bill prior to having dexa; .    VS reviewed;   Diet  Trying not to eat out at all. 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 Needed zoloft and it has helped  Checking things off her list and overall feels she is getting better   Sleep patterns: She is sleeping well    Pain- no pain now  Her body is different After 40 sessions of radiation    Cardiac Risk  Factors Addressed Hyperlipidemia - chol/hdl is 2; HDL 67 ;trig 66  Diabetes neg ; a1c 5.5    Advanced Directives  Patient Care Team: Panosh, Standley Brooking, MD as PCP - General Inda Castle, MD as Attending Physician (Gastroenterology) [name], MD as Consulting Physician (Oncology) Kyung Rudd, MD as Consulting Physician (Radiation Oncology)  Cardiac Risk Factors include: advanced age (>15men, >64 women)     Objective:    Today's Vitals   06/29/17 0902  BP: 112/70  Pulse: 88  SpO2: 99%  Weight: 177 lb (80.3 kg)  Height: 5\' 2"  (1.575 m)   Body mass index is 32.37 kg/m.   Current Medications (verified) Outpatient Encounter Prescriptions as of 06/29/2017  Medication Sig  . conjugated estrogens (PREMARIN) vaginal cream Place 1 Applicatorful vaginally at bedtime. Once twice a week  . gabapentin (NEURONTIN) 100 MG capsule TAKE 1 CAPSULE THREE TIMES DAILY (Patient taking differently: Take 2 at night)  . meloxicam (MOBIC) 15 MG tablet TAKE 1 TABLET EVERY DAY  . sertraline (ZOLOFT) 50 MG tablet Take 1 tablet (50 mg total) by mouth daily.  Marland Kitchen tiZANidine (ZANAFLEX) 4 MG tablet TAKE 1 TABLET EVERY 8 HOURS AS NEEDED FOR MUSCLE SPASM(S)   No facility-administered encounter medications on file as of 06/29/2017.  Allergies (verified) Dilaudid [hydromorphone hcl]; Benadryl [diphenhydramine]; Melatonin; and Penicillins   History: Past Medical History:  Diagnosis Date  . Bowen's disease    excised 1992  . Headache   . Hx of varicella   . 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([place])   . Thyroid cancer [facility])    Past Surgical History:  Procedure Laterality Date  . BUNIONECTOMY Right   . IR GENERIC HISTORICAL  11/14/2016   IR US GUIDE VASC ACCESS RIGHT 11/14/2016 [facility]  . IR GENERIC HISTORICAL  11/14/2016   IR FLUORO GUIDE CV LINE RIGHT 11/14/2016 [facility]  . IR GENERIC HISTORICAL  12/12/2016   IR US GUIDE VASC ACCESS RIGHT 12/12/2016 [name], MD [facility]  . IR GENERIC  HISTORICAL  12/12/2016   IR FLUORO GUIDE CV LINE RIGHT 12/12/2016 [name], MD [facility]  . SKIN CANCER EXCISION     bowens disease   Family History  Problem Relation Age of Onset  . Lung cancer Mother        lung  . Pancreatitis Father   . Breast cancer Sister   . Breast cancer Maternal Grandmother        breast   . Breast cancer Maternal Aunt        breast  . Melanoma Sister    Social History   Occupational History  . Not on file.   Social History Main Topics  . Smoking status: Former Research scientist (life sciences)  . Smokeless tobacco: Former Systems developer    Quit date: 09/20/1995  . Alcohol use 0.0 oz/week     Comment: occasional  . Drug use: Yes    Types: Marijuana  . Sexual activity: No    Tobacco Counseling Counseling given: Not Answered   Activities of Daily Living In your present state of health, do you have any difficulty performing the following activities: 06/29/2017  Hearing? N  Vision? N  Difficulty concentrating or making decisions? N  Walking or climbing stairs? N  Dressing or bathing? N  Doing errands, shopping? N  Preparing Food and eating ? N  Using the Toilet? N  In the past six months, have you accidently leaked urine? Y  Comment still has issues urinating;   Do you have problems with loss of bowel control? Y  Comment diarrhea   Managing your Medications? N  Managing your Finances? N  Some recent data might be hidden    Immunizations and Health Maintenance Immunization History  Administered Date(s) Administered  . Influenza Whole 06/25/2008, 08/10/2010  . Influenza, High Dose Seasonal PF 06/11/2015, 10/27/2016, 06/21/2017  . Pneumococcal Conjugate-13 03/11/2015  . Pneumococcal Polysaccharide-23 04/29/2016  . Tdap 02/13/2013  . Zoster 02/13/2013   There are no preventive care reminders to display for this patient.  Patient Care Team: Panosh, Standley Brooking, MD as PCP - General Inda Castle, MD as Attending Physician (Gastroenterology) [name], MD as  Consulting Physician (Oncology) Kyung Rudd, MD as Consulting Physician (Radiation Oncology)  Indicate any recent Medical Services you may have received from other than [facility] providers in the past year (date may be approximate).     Assessment:   This is a routine wellness examination for Ashley Moore.   Hearing/Vision screen Hearing Screening Comments: Hearing issues None at present Had hearing screen at [employer] Vision Screening Comments: Vision issues After the chemo and the radiation; eyes were dry at hs  [employer]  Dietary issues and exercise activities discussed: Current Exercise Habits: Home exercise routine, Type of exercise: strength training/weights, Time (  Minutes): 60, Frequency (Times/Week): 4, Weekly Exercise (Minutes/Week): 240, Intensity: Moderate  Goals    . Exercise 150 minutes per week (moderate activity)          Continue with aggressive exercise x 5 days a week   Start 30 minutes;       Depression Screen PHQ 2/9 Scores 06/29/2017 02/14/2017 01/24/2017 01/05/2017 03/11/2015  PHQ - 2 Score 0 1 0 1 1    Fall Risk Fall Risk  06/29/2017 02/14/2017 01/24/2017 01/05/2017 11/14/2016  Falls in the past year? No No No No Yes  Number falls in past yr: - - - - 1  Injury with Fall? - - - - Yes  Risk for fall due to : - - - - History of fall(s);Impaired balance/gait  Follow up - - - - Falls evaluation completed;Falls prevention discussed    Cognitive Function:   Ad8 score reviewed for issues:  Issues making decisions:  Less interest in hobbies / activities:  Repeats questions, stories (family complaining):  Trouble using ordinary gadgets (microwave, computer, phone):  Forgets the month or year:   Mismanaging finances:   Remembering appts:  Daily problems with thinking and/or memory: Ad8 score is=0 States everything is coming back           Screening Tests Health Maintenance  Topic Date Due  . DEXA SCAN  01/16/2018 (Originally 08/22/2014)  . Hepatitis  C Screening  01/16/2018 (Originally 26-Nov-1950)  . MAMMOGRAM  10/05/2017  . COLONOSCOPY  11/05/2020  . TETANUS/TDAP  02/14/2023  . INFLUENZA VACCINE  Completed  . PNA vac Low Risk Adult  Completed      Plan:     PCP Notes   Health Maintenance Colonoscopy 2/217 Mammogram 09/2015; cancer md stated to wait this year due to all the chemo she had   Holding dexa until she sees Dr. Regis Bill in 6 months Also plans to see GYN   Abnormal Screens  BMI 32; but eating special diet; very healthy choices  GI and bowel and bladder status still in process of healing    Referrals  None   Patient concerns; None   Nurse Concerns;  as noted  Starting to manage her stress a little at a time   Next PCP apt She will make on in 6 months; around April of 2019      I have personally reviewed and noted the following in the patient's chart:   . Medical and social history . Use of alcohol, tobacco or illicit drugs  . Current medications and supplements . Functional ability and status . Nutritional status . Physical activity . Advanced directives . List of other physicians . Hospitalizations, surgeries, and ER visits in previous 12 months . Vitals . Screenings to include cognitive, depression, and falls . Referrals and appointments  In addition, I have reviewed and discussed with patient certain preventive protocols, quality metrics, and best practice recommendations. A written personalized care plan for preventive services as well as general preventive health recommendations were provided to patient.     WPYKD,XIPJA, RN   06/29/2017   Above noted reviewed and agree. Lottie Dawson, MD

## 2017-06-29 ENCOUNTER — Ambulatory Visit (INDEPENDENT_AMBULATORY_CARE_PROVIDER_SITE_OTHER): Payer: Medicare HMO

## 2017-06-29 VITALS — BP 112/70 | HR 88 | Ht 62.0 in | Wt 177.0 lb

## 2017-06-29 DIAGNOSIS — Z0000 Encounter for general adult medical examination without abnormal findings: Secondary | ICD-10-CM | POA: Diagnosis not present

## 2017-06-29 NOTE — Patient Instructions (Addendum)
Ashley Moore , Thank you for taking time to come for your Medicare Wellness Visit. I appreciate your ongoing commitment to your health goals. Please review the following plan we discussed and let me know if I can assist you in the future.   Can fup on Hep C when treatment for rectal cancer is complete   Recommendations for Dexa Scan - to defer until she sees Dr. Peggye Ley in 6 months  Female over the age of 61 Man age 68 or older If you broke a bone past the age of 40 Women menopausal age with risk factors (thin frame; smoker; hx of fx ) Post menopausal women under the age of 79 with risk factors A man age 35 to 79 with risk factors Other: Spine xray that is showing break of bone loss Back pain with possible break Height loss of 1/2 inch or more within one year Total loss in height of 1.5 inches from your original height  Calcium 1215m with Vit D 800u per day; more as directed by physician Strength building exercises discussed; can include walking; housework; small weights or stretch bands; silver sneakers if [facility] to the [facility]  Please visit the osteoporosis foundation.org for up to date recommendations   These are the goals we discussed: on hold until she feels up to exercise  Goals    . Exercise 150 minutes per week (moderate activity)          Continue with aggressive exercise x 5 days a week   Start 30 minutes;        This is a list of the screening recommended for you and due dates:  Health Maintenance  Topic Date Due  .  Hepatitis C: One time screening is recommended by Center for Disease Control  (CDC) for  adults born from [birth year]through 1965.   [id number] . DEXA scan (bone density measurement)  08/22/2014  . Mammogram  10/05/2017  . Colon Cancer Screening  11/05/2020  . Tetanus Vaccine  02/14/2023  . Flu Shot  Completed  . Pneumonia vaccines  Completed    Prevention of falls: Remove rugs or any tripping hazards in the home Use Non slip mats in bathtubs and  showers Placing grab bars next to the toilet and or shower Placing handrails on both sides of the stair way Adding extra lighting in the home.   Personal safety issues reviewed:  1. Consider starting a community watch program per [facility]2.  Changes batteries is smoke detector and/or carbon monoxide detector  3.  If you have firearms; keep them in a safe place 4.  Wear protection when in the sun; Always wear sunscreen or a hat; It is good to have your doctor check your skin annually or review any new areas of concern 5. Driving safety; Keep in the right lane; stay 3 car lengths behind the car in front of you on the highway; look 3 times prior to pulling out; carry your cell phone everywhere you go!    Learn about the Yellow Dot program:  The program allows first responders at your emergency to have access to who your physician is, as well as your medications and medical conditions.  Citizens requesting the Yellow Dot Packages should contact Master Corporal KNunzio Cobbsat the [facility]([phone] the first week of the program and beginning the week after Easter citizens should contact their lScientist, physiological       Bone Densitometry Bone densitometry  is an imaging test that uses a special X-ray to measure the amount of calcium and other minerals in your bones (bone density). This test is also known as a bone mineral density test or dual-energy X-ray absorptiometry (DXA). The test can measure bone density at your hip and your spine. It is similar to having a regular X-ray. You may have this test to:  Diagnose a condition that causes weak or thin bones (osteoporosis).  Predict your risk of a broken bone (fracture).  Determine how well osteoporosis treatment is working.  Tell a health care provider about:  Any allergies you have.  All medicines you are taking, including vitamins, herbs, eye drops, creams, and over-the-counter  medicines.  Any problems you or family members have had with anesthetic medicines.  Any blood disorders you have.  Any surgeries you have had.  Any medical conditions you have.  Possibility of pregnancy.  Any other medical test you had within the previous 14 days that used contrast material. What are the risks? Generally, this is a safe procedure. However, problems can occur and may include the following:  This test exposes you to a very small amount of radiation.  The risks of radiation exposure may be greater to unborn children.  What happens before the procedure?  Do not take any calcium supplements for 24 hours before having the test. You can otherwise eat and drink what you usually do.  Take off all metal jewelry, eyeglasses, dental appliances, and any other metal objects. What happens during the procedure?  You may lie on an exam table. There will be an X-ray generator below you and an imaging device above you.  Other devices, such as boxes or braces, may be used to position your body properly for the scan.  You will need to lie still while the machine slowly scans your body.  The images will show up on a computer monitor. What happens after the procedure? You may need more testing at a later time. This information is not intended to replace advice given to you by your health care provider. Make sure you discuss any questions you have with your health care provider. Document Released: 09/27/2004 Document Revised: 02/11/2016 Document Reviewed: 02/13/2014 Elsevier Interactive Patient Education  2018 [place] in the Home Falls can cause injuries. They can happen to people of all ages. There are many things you can do to make your home safe and to help prevent falls. What can I do on the outside of my home?  Regularly fix the edges of walkways and driveways and fix any cracks.  Remove anything that might make you trip as you walk through a  door, such as a raised step or threshold.  Trim any bushes or trees on the path to your home.  Use bright outdoor lighting.  Clear any walking paths of anything that might make someone trip, such as rocks or tools.  Regularly check to see if handrails are loose or broken. Make sure that both sides of any steps have handrails.  Any raised decks and porches should have guardrails on the edges.  Have any leaves, snow, or ice cleared regularly.  Use sand or salt on walking paths during winter.  Clean up any spills in your garage right away. This includes oil or grease spills. What can I do in the bathroom?  Use night lights.  Install grab bars by the toilet and in the tub and shower. Do not use towel bars  as grab bars.  Use non-skid mats or decals in the tub or shower.  If you need to sit down in the shower, use a plastic, non-slip stool.  Keep the floor dry. Clean up any water that spills on the floor as soon as it happens.  Remove soap buildup in the tub or shower regularly.  Attach bath mats securely with double-sided non-slip rug tape.  Do not have throw rugs and other things on the floor that can make you trip. What can I do in the bedroom?  Use night lights.  Make sure that you have a light by your bed that is easy to reach.  Do not use any sheets or blankets that are too big for your bed. They should not hang down onto the floor.  Have a firm chair that has side arms. You can use this for support while you get dressed.  Do not have throw rugs and other things on the floor that can make you trip. What can I do in the kitchen?  Clean up any spills right away.  Avoid walking on wet floors.  Keep items that you use a lot in easy-to-reach places.  If you need to reach something above you, use a strong step stool that has a grab bar.  Keep electrical cords out of the way.  Do not use floor polish or wax that makes floors slippery. If you must use wax, use  non-skid floor wax.  Do not have throw rugs and other things on the floor that can make you trip. What can I do with my stairs?  Do not leave any items on the stairs.  Make sure that there are handrails on both sides of the stairs and use them. Fix handrails that are broken or loose. Make sure that handrails are as long as the stairways.  Check any carpeting to make sure that it is firmly attached to the stairs. Fix any carpet that is loose or worn.  Avoid having throw rugs at the top or bottom of the stairs. If you do have throw rugs, attach them to the floor with carpet tape.  Make sure that you have a light switch at the top of the stairs and the bottom of the stairs. If you do not have them, ask someone to add them for you. What else can I do to help prevent falls?  Wear shoes that: ? Do not have high heels. ? Have rubber bottoms. ? Are comfortable and fit you well. ? Are closed at the toe. Do not wear sandals.  If you use a stepladder: ? Make sure that it is fully opened. Do not climb a closed stepladder. ? Make sure that both sides of the stepladder are locked into place. ? Ask someone to hold it for you, if possible.  Clearly mark and make sure that you can see: ? Any grab bars or handrails. ? First and last steps. ? Where the edge of each step is.  Use tools that help you move around (mobility aids) if they are needed. These include: ? Canes. ? Walkers. ? Scooters. ? Crutches.  Turn on the lights when you go into a dark area. Replace any light bulbs as soon as they burn out.  Set up your furniture so you have a clear path. Avoid moving your furniture around.  If any of your floors are uneven, fix them.  If there are any pets around you, be aware of where they are.  Review your medicines with your doctor. Some medicines can make you feel dizzy. This can increase your chance of falling. Ask your doctor what other things that you can do to help prevent falls. This  information is not intended to replace advice given to you by your health care provider. Make sure you discuss any questions you have with your health care provider. Document Released: 07/02/2009 Document Revised: 02/11/2016 Document Reviewed: 10/10/2014 Elsevier Interactive Patient Education  2018 [place] Maintenance, Female Adopting a healthy lifestyle and getting preventive care can go a long way to promote health and wellness. Talk with your health care provider about what schedule of regular examinations is right for you. This is a good chance for you to check in with your provider about disease prevention and staying healthy. In between checkups, there are plenty of things you can do on your own. Experts have done a lot of research about which lifestyle changes and preventive measures are most likely to keep you healthy. Ask your health care provider for more information. Weight and diet Eat a healthy diet  Be sure to include plenty of vegetables, fruits, low-fat dairy products, and lean protein.  Do not eat a lot of foods high in solid fats, added sugars, or salt.  Get regular exercise. This is one of the most important things you can do for your health. ? Most adults should exercise for at least 150 minutes each week. The exercise should increase your heart rate and make you sweat (moderate-intensity exercise). ? Most adults should also do strengthening exercises at least twice a week. This is in addition to the moderate-intensity exercise.  Maintain a healthy weight  Body mass index (BMI) is a measurement that can be used to identify possible weight problems. It estimates body fat based on height and weight. Your health care provider can help determine your BMI and help you achieve or maintain a healthy weight.  For females 55 years of age and older: ? A BMI below 18.5 is considered underweight. ? A BMI of 18.5 to 24.9 is normal. ? A BMI of 25 to 29.9 is considered  overweight. ? A BMI of 30 and above is considered obese.  Watch levels of cholesterol and blood lipids  You should start having your blood tested for lipids and cholesterol at 68 years of age, then have this test every 5 years.  You may need to have your cholesterol levels checked more often if: ? Your lipid or cholesterol levels are high. ? You are older than 68 years of age. ? You are at high risk for heart disease.  Cancer screening Lung Cancer  Lung cancer screening is recommended for adults 60-68 years old who are at high risk for lung cancer because of a history of smoking.  A yearly low-dose CT scan of the lungs is recommended for people who: ? Currently smoke. ? Have quit within the past 15 years. ? Have at least a 30-pack-year history of smoking. A pack year is smoking an average of one pack of cigarettes a day for 1 year.  Yearly screening should continue until it has been 15 years since you quit.  Yearly screening should stop if you develop a health problem that would prevent you from having lung cancer treatment.  Breast Cancer  Practice breast self-awareness. This means understanding how your breasts normally appear and feel.  It also means doing regular breast self-exams. Let your health care provider know about any changes, no  matter how small.  If you are in your 20s or 30s, you should have a clinical breast exam (CBE) by a health care provider every 1-3 years as part of a regular health exam.  If you are 25 or older, have a CBE every year. Also consider having a breast X-ray (mammogram) every year.  If you have a family history of breast cancer, talk to your health care provider about genetic screening.  If you are at high risk for breast cancer, talk to your health care provider about having an MRI and a mammogram every year.  Breast cancer gene (BRCA) assessment is recommended for women who have family members with BRCA-related cancers. BRCA-related cancers  include: ? Breast. ? Ovarian. ? Tubal. ? Peritoneal cancers.  Results of the assessment will determine the need for genetic counseling and BRCA1 and BRCA2 testing.  Cervical Cancer Your health care provider may recommend that you be screened regularly for cancer of the pelvic organs (ovaries, uterus, and vagina). This screening involves a pelvic examination, including checking for microscopic changes to the surface of your cervix (Pap test). You may be encouraged to have this screening done every 3 years, beginning at age 39.  For women ages 58-65, health care providers may recommend pelvic exams and Pap testing every 3 years, or they may recommend the Pap and pelvic exam, combined with testing for human papilloma virus (HPV), every 5 years. Some types of HPV increase your risk of cervical cancer. Testing for HPV may also be done on women of any age with unclear Pap test results.  Other health care providers may not recommend any screening for nonpregnant women who are considered low risk for pelvic cancer and who do not have symptoms. Ask your health care provider if a screening pelvic exam is right for you.  If you have had past treatment for cervical cancer or a condition that could lead to cancer, you need Pap tests and screening for cancer for at least 20 years after your treatment. If Pap tests have been discontinued, your risk factors (such as having a new sexual partner) need to be reassessed to determine if screening should resume. Some women have medical problems that increase the chance of getting cervical cancer. In these cases, your health care provider may recommend more frequent screening and Pap tests.  Colorectal Cancer  This type of cancer can be detected and often prevented.  Routine colorectal cancer screening usually begins at 68 years of age and continues through 68 years of age.  Your health care provider may recommend screening at an earlier age if you have risk factors  for colon cancer.  Your health care provider may also recommend using home test kits to check for hidden blood in the stool.  A small camera at the end of a tube can be used to examine your colon directly (sigmoidoscopy or colonoscopy). This is done to check for the earliest forms of colorectal cancer.  Routine screening usually begins at age 58.  Direct examination of the colon should be repeated every 5-10 years through 68 years of age. However, you may need to be screened more often if early forms of precancerous polyps or small growths are found.  Skin Cancer  Check your skin from head to toe regularly.  Tell your health care provider about any new moles or changes in moles, especially if there is a change in a mole's shape or color.  Also tell your health care provider if you have  a mole that is larger than the size of a pencil eraser.  Always use sunscreen. Apply sunscreen liberally and repeatedly throughout the day.  Protect yourself by wearing long sleeves, pants, a wide-brimmed hat, and sunglasses whenever you are outside.  Heart disease, diabetes, and high blood pressure  High blood pressure causes heart disease and increases the risk of stroke. High blood pressure is more likely to develop in: ? People who have blood pressure in the high end of the normal range (130-139/85-89 mm Hg). ? People who are overweight or obese. ? People who are African American.  If you are 6-46 years of age, have your blood pressure checked every 3-5 years. If you are 5 years of age or older, have your blood pressure checked every year. You should have your blood pressure measured twice-once when you are at a hospital or clinic, and once when you are not at a hospital or clinic. Record the average of the two measurements. To check your blood pressure when you are not at a hospital or clinic, you can use: ? An automated blood pressure machine at a pharmacy. ? A home blood pressure monitor.  If  you are between 84 years and 50 years old, ask your health care provider if you should take aspirin to prevent strokes.  Have regular diabetes screenings. This involves taking a blood sample to check your fasting blood sugar level. ? If you are at a normal weight and have a low risk for diabetes, have this test once every three years after 68 years of age. ? If you are overweight and have a high risk for diabetes, consider being tested at a younger age or more often. Preventing infection Hepatitis B  If you have a higher risk for hepatitis B, you should be screened for this virus. You are considered at high risk for hepatitis B if: ? You were born in a country where hepatitis B is common. Ask your health care provider which countries are considered high risk. ? Your parents were born in a high-risk country, and you have not been immunized against hepatitis B (hepatitis B vaccine). ? You have HIV or AIDS. ? You use needles to inject street drugs. ? You live with someone who has hepatitis B. ? You have had sex with someone who has hepatitis B. ? You get hemodialysis treatment. ? You take certain medicines for conditions, including cancer, organ transplantation, and autoimmune conditions.  Hepatitis C  Blood testing is recommended for: ? Everyone born from [birth year] through 1967. ? Anyone with known risk factors for hepatitis C.  Sexually transmitted infections (STIs)  You should be screened for sexually transmitted infections (STIs) including gonorrhea and chlamydia if: ? You are sexually active and are younger than 68 years of age. ? You are older than 68 years of age and your health care provider tells you that you are at risk for this type of infection. ? Your sexual activity has changed since you were last screened and you are at an increased risk for chlamydia or gonorrhea. Ask your health care provider if you are at risk.  If you do not have HIV, but are at risk, it may be recommended  that you take a prescription medicine daily to prevent HIV infection. This is called pre-exposure prophylaxis (PrEP). You are considered at risk if: ? You are sexually active and do not regularly use condoms or know the HIV status of your partner(s). ? You take drugs by injection. ?  You are sexually active with a partner who has HIV.  Talk with your health care provider about whether you are at high risk of being infected with HIV. If you choose to begin PrEP, you should first be tested for HIV. You should then be tested every 3 months for as long as you are taking PrEP. Pregnancy  If you are premenopausal and you may become pregnant, ask your health care provider about preconception counseling.  If you may become pregnant, take 400 to 800 micrograms (mcg) of folic acid every day.  If you want to prevent pregnancy, talk to your health care provider about birth control (contraception). Osteoporosis and menopause  Osteoporosis is a disease in which the bones lose minerals and strength with aging. This can result in serious bone fractures. Your risk for osteoporosis can be identified using a bone density scan.  If you are 26 years of age or older, or if you are at risk for osteoporosis and fractures, ask your health care provider if you should be screened.  Ask your health care provider whether you should take a calcium or vitamin D supplement to lower your risk for osteoporosis.  Menopause may have certain physical symptoms and risks.  Hormone replacement therapy may reduce some of these symptoms and risks. Talk to your health care provider about whether hormone replacement therapy is right for you. Follow these instructions at home:  Schedule regular health, dental, and eye exams.  Stay current with your immunizations.  Do not use any tobacco products including cigarettes, chewing tobacco, or electronic cigarettes.  If you are pregnant, do not drink alcohol.  If you are  breastfeeding, limit how much and how often you drink alcohol.  Limit alcohol intake to no more than 1 drink per day for nonpregnant women. One drink equals 12 ounces of beer, 5 ounces of wine, or 1 ounces of hard liquor.  Do not use street drugs.  Do not share needles.  Ask your health care provider for help if you need support or information about quitting drugs.  Tell your health care provider if you often feel depressed.  Tell your health care provider if you have ever been abused or do not feel safe at home. This information is not intended to replace advice given to you by your health care provider. Make sure you discuss any questions you have with your health care provider. Document Released: 03/21/2011 Document Revised: 02/11/2016 Document Reviewed: 06/09/2015 Elsevier Interactive Patient Education  [employer].

## 2017-07-05 ENCOUNTER — Encounter: Payer: Self-pay | Admitting: Physical Therapy

## 2017-07-05 ENCOUNTER — Ambulatory Visit: Payer: Medicare HMO | Admitting: Physical Therapy

## 2017-07-05 DIAGNOSIS — R279 Unspecified lack of coordination: Secondary | ICD-10-CM

## 2017-07-05 DIAGNOSIS — Z483 Aftercare following surgery for neoplasm: Secondary | ICD-10-CM | POA: Diagnosis not present

## 2017-07-05 DIAGNOSIS — R252 Cramp and spasm: Secondary | ICD-10-CM

## 2017-07-05 DIAGNOSIS — M6281 Muscle weakness (generalized): Secondary | ICD-10-CM | POA: Diagnosis not present

## 2017-07-05 NOTE — Therapy (Signed)
[facility] [address] [place], [place], [address] Phone: [phone]   Fax:  [phone]  Physical Therapy Treatment  Patient Details  Name: Ashley Moore MRN: [id number] Date of Birth: 1950/12/01 Referring Provider: Hayden Pedro  Encounter Date: 07/05/2017      PT End of Session - 07/05/17 1607    Visit Number 14   Date for PT Re-Evaluation 08/16/17   Authorization Type [employer]; G-code 20th visit   PT Start Time 1530   PT Stop Time 1610   PT Time Calculation (min) 40 min   Activity Tolerance Patient tolerated treatment well   Behavior During Therapy [facility] for tasks assessed/performed      Past Medical History:  Diagnosis Date  . Bowen's disease    excised 1992  . Headache   . Hx of varicella   . 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([place])   . Thyroid cancer [facility])     Past Surgical History:  Procedure Laterality Date  . BUNIONECTOMY Right   . IR GENERIC HISTORICAL  11/14/2016   IR US GUIDE VASC ACCESS RIGHT 11/14/2016 [facility]  . IR GENERIC HISTORICAL  11/14/2016   IR FLUORO GUIDE CV LINE RIGHT 11/14/2016 [facility]  . IR GENERIC HISTORICAL  12/12/2016   IR US GUIDE VASC ACCESS RIGHT 12/12/2016 [name], MD [facility]  . IR GENERIC HISTORICAL  12/12/2016   IR FLUORO GUIDE CV LINE RIGHT 12/12/2016 [name], MD [facility]  . SKIN CANCER EXCISION     bowens disease    There were no vitals filed for this visit.      Subjective Assessment - 07/05/17 1532    Subjective I am not as raw now.  My anus feels stretched out due to a large solid stool.    Pertinent History Diagnosed with anal cancer 6 weeks ago but reports being misdiagnosed by 3 doctors over the past year as having hemorrhoids. They began chemo and radiation simultaneously about a month ago. 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   How long can you sit comfortably? Few minutes   Patient Stated Goals improve fecal leakage,  improve the vaginal opening to improve urine stream   Currently in Pain? No/denies                      Pelvic Floor Special Questions - 07/05/17 0001    Pelvic Floor Internal Exam Patient confirms identification and approves PT to assess the perineal and muscle integrity   Exam Type Vaginal           [facility] Adult PT Treatment/Exercise - 07/05/17 0001      Self-Care   Self-Care Other Self-Care Comments   Other Self-Care Comments  has patient look at her perineum with a mirror and educated her on the anatomy and why she is to put ointment on the labia minor due to its enlargement and irritation     Manual Therapy   Manual Therapy Internal Pelvic Floor;Myofascial release   Myofascial Release perineal release of the perineum  pulling  downward goidn through planes of fascia   Internal Pelvic Floor soft tissue work to the introitus very gently; release on bil. sides of the urethra; bil. side of the rectum, bil. levator ani and obturator internist; able to place two fingers in for 1 cm and had increased stretch                PT Education - 07/05/17 1613  Education provided Yes   Education Details place ointment on the labia minor to decrease skin irritation   Person(s) Educated Patient   Methods Explanation   Comprehension Verbalized understanding          PT Short Term Goals - 05/02/17 1408      PT SHORT TERM GOAL #1   Title independent with initial HEP   Time 4   Period Weeks   Status Achieved     PT SHORT TERM GOAL #2   Title understand on how to use the dilator with movement and how to progress   Time 4   Period Weeks   Status Achieved     PT SHORT TERM GOAL #3   Title understand how to do self perineal massage to separate the labia minora   Time 4   Period Weeks   Status Achieved     PT SHORT TERM GOAL #4   Title fecal leakage decreased >/= 25% due to improved pelvic floor muscle control   Time 4   Period Weeks   Status Achieved            PT Long Term Goals - 07/05/17 1613      PT LONG TERM GOAL #1   Title independent with HEP   Time 8   Period Weeks   Status On-going     PT LONG TERM GOAL #2   Title ability to use the largest size dilator and understand she will have to use it for 2 years   Baseline largest small   Time 8   Period Weeks   Status On-going     PT LONG TERM GOAL #3   Title urine stream able to go straight and not have to clean herself due to spraying of urine   Time 8   Period Weeks   Status Deferred     PT LONG TERM GOAL #4   Title fecal incontinence decreased >/= 75% due to increase control of pelvic floor muscles   Baseline improved by 75% better and leak one time every 2 weeks; had it today due to eating something that irritated her   Time 8   Period Weeks   Status On-going     PT LONG TERM GOAL #5   Title sit for 45 mintues due to skin around the perineal and rectal area is healed from the radiation   Time 8   Period Weeks   Status Achieved               Plan - 07/05/17 1534    Clinical Impression Statement Patient has softer introitus.  Patient was able to have two finges of the therapist into the introitus 1 cm in with increased stretching. Patient skin around the introitus has good coloring.  Patient was educated on her perineum and how she has the labia fused and one side is longer than the other.  Patient will beenfit from skilled therapy to improve fascial restrictions and aroun the fibrotic radiated skin.    Rehab Potential Good   Clinical Impairments Affecting Rehab Potential Extent of disease   PT Frequency Biweekly   PT Duration 8 weeks   PT Treatment/Interventions Biofeedback;Therapeutic activities;Therapeutic exercise;Neuromuscular re-education;Patient/family education;Passive range of motion;Scar mobilization;Manual techniques;Energy conservation   PT Next Visit Plan   soft tissue work to labia and internal,  lymph drainage; bulging of perineum; next  visit add [place] progress as needed   Consulted and Agree with  Plan of Care Patient      Patient will benefit from skilled therapeutic intervention in order to improve the following deficits and impairments:  Decreased coordination, Increased fascial restricitons, Decreased endurance, Increased muscle spasms, Decreased activity tolerance, Impaired flexibility, Decreased strength, Decreased mobility, Decreased scar mobility, Decreased skin integrity  Visit Diagnosis: Muscle weakness (generalized)  Cramp and spasm  Unspecified lack of coordination  Aftercare following surgery for neoplasm     Problem List Patient Active Problem List   Diagnosis Date Noted  . 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 Port catheter in place 11/21/2016  . 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 Anal cancer ([place]) 11/03/2016  . Cystitis 05/06/2015  . Degenerative cervical disc 04/08/2015  . Trigger point of neck 03/26/2015  . Pain in joint, ankle and foot 04/23/2013  . Visit for preventive health examination 02/13/2013  . Routine gynecological examination 02/13/2013  . Family history of breast cancer in first degree relative 02/13/2013  . Rash and nonspecific skin eruption 02/13/2013  . 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 VITAMIN D DEFICIENCY 06/25/2008  . NECK PAIN 06/02/2008  . FOOT PAIN 06/02/2008  . OSTEOPENIA 06/02/2008    Earlie Counts, PT 07/05/17 4:16 PM    [facility] [address], [place], [place], [address] Phone: [phone]   Fax:  [phone]  Name: AMAZIAH GHOSH MRN: [id number] Date of Birth: May 25, 1951

## 2017-07-12 DIAGNOSIS — Q525 Fusion of labia: Secondary | ICD-10-CM | POA: Diagnosis not present

## 2017-07-12 DIAGNOSIS — C210 Malignant neoplasm of anus, unspecified: Secondary | ICD-10-CM | POA: Diagnosis not present

## 2017-07-13 ENCOUNTER — Ambulatory Visit: Payer: Medicare HMO | Admitting: Physical Therapy

## 2017-07-13 ENCOUNTER — Encounter: Payer: Self-pay | Admitting: Physical Therapy

## 2017-07-13 ENCOUNTER — Other Ambulatory Visit: Payer: Self-pay | Admitting: Neurology

## 2017-07-13 DIAGNOSIS — Z483 Aftercare following surgery for neoplasm: Secondary | ICD-10-CM | POA: Diagnosis not present

## 2017-07-13 DIAGNOSIS — R252 Cramp and spasm: Secondary | ICD-10-CM | POA: Diagnosis not present

## 2017-07-13 DIAGNOSIS — R279 Unspecified lack of coordination: Secondary | ICD-10-CM | POA: Diagnosis not present

## 2017-07-13 DIAGNOSIS — M6281 Muscle weakness (generalized): Secondary | ICD-10-CM

## 2017-07-13 NOTE — Patient Instructions (Addendum)
Slow Contraction: Gravity Eliminated (Hook-Lying)    Lie with hips and knees bent. Slowly squeeze pelvic floor for _8__ seconds. Rest for _10__ seconds. While you bulge your pelvic floor.  Repeat _10__ times. Do _2__ times a day. Also do in sitting 2 times per day.    Copyright  VHI. All rights reserved.   [place] [address] [place], [place] [address] Phone # [phone] Fax [phone]

## 2017-07-13 NOTE — Therapy (Signed)
[facility] [address], [place], [place], [address] Phone: [phone]   Fax:  [phone]  Physical Therapy Treatment  Patient Details  Name: Ashley Moore MRN: [id number] Date of Birth: [birth year] Referring Provider: Hayden Pedro  Encounter Date: 07/13/2017      PT End of Session - 07/13/17 1702    Visit Number 15   Date for PT Re-Evaluation 08/16/17   Authorization Type [employer]; G-code 20th visit   PT Start Time 1615   PT Stop Time 1702   PT Time Calculation (min) 47 min   Activity Tolerance Patient tolerated treatment well   Behavior During Therapy [facility] for tasks assessed/performed      Past Medical History:  Diagnosis Date  . Bowen's disease    excised 1992  . Headache   . Hx of varicella   . 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([place])   . Thyroid cancer [facility])     Past Surgical History:  Procedure Laterality Date  . BUNIONECTOMY Right   . IR GENERIC HISTORICAL  11/14/2016   IR US GUIDE VASC ACCESS RIGHT 11/14/2016 [facility]  . IR GENERIC HISTORICAL  11/14/2016   IR FLUORO GUIDE CV LINE RIGHT 11/14/2016 [facility]  . IR GENERIC HISTORICAL  12/12/2016   IR US GUIDE VASC ACCESS RIGHT 12/12/2016 [name], MD [facility]  . IR GENERIC HISTORICAL  12/12/2016   IR FLUORO GUIDE CV LINE RIGHT 12/12/2016 [name], MD [facility]  . SKIN CANCER EXCISION     bowens disease    There were no vitals filed for this visit.      Subjective Assessment - 07/13/17 1615    Subjective I saw the doctor about the vaginal.  He wants me to use the dilator daily.  He wants me to use premarin daily and on the labia. I will see him in 6 months. I am on the third size without pain.  I felt good after the last visit and helping me to progress to the next level of dilator.    Pertinent History Diagnosed with anal cancer 6 weeks ago but reports being misdiagnosed by 3 doctors over the past year as having hemorrhoids. They began  chemo and radiation simultaneously about a month ago. 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   How long can you sit comfortably? Few minutes   Patient Stated Goals improve fecal leakage, improve the vaginal opening to improve urine stream   Currently in Pain? No/denies                      Pelvic Floor Special Questions - 07/13/17 0001    Pelvic Floor Internal Exam Patient confirms identification and approves PT to assess the perineal and muscle integrity   Exam Type Vaginal   Strength good squeeze, good lift, able to hold agaisnt strong resistance           [facility] Adult PT Treatment/Exercise - 07/13/17 0001      Manual Therapy   Manual Therapy Internal Pelvic Floor;Myofascial release   Myofascial Release perineal release of the perineum  pulling  downward goidn through planes of fascia   Internal Pelvic Floor soft tissue work to the introitus very gently; release on bil. sides of the urethra; bil. side of the rectum, bil. levator ani and obturator internist; able to place two fingers in for 1 cm and had increased stretch  PT Education - 07/13/17 1701    Education provided Yes   Education Details pelvic floor contraction with bulge in [employer]) Educated Patient   Methods Explanation;Demonstration;Verbal cues;Handout   Comprehension Returned demonstration;Verbalized understanding          PT Short Term Goals - 05/02/17 1408      PT SHORT TERM GOAL #1   Title independent with initial HEP   Time 4   Period Weeks   Status Achieved     PT SHORT TERM GOAL #2   Title understand on how to use the dilator with movement and how to progress   Time 4   Period Weeks   Status Achieved     PT SHORT TERM GOAL #3   Title understand how to do self perineal massage to separate the labia minora   Time 4   Period Weeks   Status Achieved     PT SHORT TERM GOAL #4   Title fecal leakage decreased >/= 25% due to improved pelvic  floor muscle control   Time 4   Period Weeks   Status Achieved           PT Long Term Goals - 07/05/17 1613      PT LONG TERM GOAL #1   Title independent with HEP   Time 8   Period Weeks   Status On-going     PT LONG TERM GOAL #2   Title ability to use the largest size dilator and understand she will have to use it for 2 years   Baseline largest small   Time 8   Period Weeks   Status On-going     PT LONG TERM GOAL #3   Title urine stream able to go straight and not have to clean herself due to spraying of urine   Time 8   Period Weeks   Status Deferred     PT LONG TERM GOAL #4   Title fecal incontinence decreased >/= 75% due to increase control of pelvic floor muscles   Baseline improved by 75% better and leak one time every 2 weeks; had it today due to eating something that irritated her   Time 8   Period Weeks   Status On-going     PT LONG TERM GOAL #5   Title sit for 45 mintues due to skin around the perineal and rectal area is healed from the radiation   Time 8   Period Weeks   Status Achieved               Plan - 07/13/17 1706    Clinical Impression Statement Patient is now on the third dilator for first time.  Patient had difficulty to be in the stirrups with vaginal exam due to tightness in the inner thigh. Patient was able to tolerate increased pressure during the soft tissue work. Patient will benefit from skilled therapy to improve fascial restrictions and around the fibrotic radiated skin.    Rehab Potential Good   Clinical Impairments Affecting Rehab Potential Extent of disease   PT Frequency Biweekly   PT Duration 8 weeks   PT Treatment/Interventions Biofeedback;Therapeutic activities;Therapeutic exercise;Neuromuscular re-education;Patient/family education;Passive range of motion;Scar mobilization;Manual techniques;Energy conservation   PT Next Visit Plan   soft tissue work to labia and internal,  bulging of perineum; next visit add KX; stretch  inner thigh   PT Home Exercise Plan progress as needed   Consulted and Agree with Plan of Care Patient  Patient will benefit from skilled therapeutic intervention in order to improve the following deficits and impairments:  Decreased coordination, Increased fascial restricitons, Decreased endurance, Increased muscle spasms, Decreased activity tolerance, Impaired flexibility, Decreased strength, Decreased mobility, Decreased scar mobility, Decreased skin integrity  Visit Diagnosis: Muscle weakness (generalized)  Cramp and spasm  Unspecified lack of coordination  Aftercare following surgery for neoplasm     Problem List Patient Active Problem List   Diagnosis Date Noted  . 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 Port catheter in place 11/21/2016  . 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 Anal cancer ([place]) 11/03/2016  . Cystitis 05/06/2015  . Degenerative cervical disc 04/08/2015  . Trigger point of neck 03/26/2015  . Pain in joint, ankle and foot 04/23/2013  . Visit for preventive health examination 02/13/2013  . Routine gynecological examination 02/13/2013  . Family history of breast cancer in first degree relative 02/13/2013  . Rash and nonspecific skin eruption 02/13/2013  . 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 VITAMIN D DEFICIENCY 06/25/2008  . NECK PAIN 06/02/2008  . FOOT PAIN 06/02/2008  . OSTEOPENIA 06/02/2008    Earlie Counts, PT 07/13/17 5:13 PM   [facility] [address] [place], [place], [address] Phone: [phone]   Fax:  [phone]  Name: KATIE FARAONE MRN: [id number] Date of Birth: 1950/11/12

## 2017-07-20 ENCOUNTER — Ambulatory Visit: Payer: Medicare HMO | Attending: Radiation Oncology | Admitting: Physical Therapy

## 2017-07-20 ENCOUNTER — Encounter: Payer: Self-pay | Admitting: Physical Therapy

## 2017-07-20 DIAGNOSIS — M6281 Muscle weakness (generalized): Secondary | ICD-10-CM | POA: Diagnosis not present

## 2017-07-20 DIAGNOSIS — R279 Unspecified lack of coordination: Secondary | ICD-10-CM | POA: Diagnosis not present

## 2017-07-20 DIAGNOSIS — Z483 Aftercare following surgery for neoplasm: Secondary | ICD-10-CM

## 2017-07-20 DIAGNOSIS — R252 Cramp and spasm: Secondary | ICD-10-CM | POA: Insufficient documentation

## 2017-07-20 NOTE — Patient Instructions (Signed)

## 2017-07-20 NOTE — Therapy (Signed)
[facility] [address] [place], [place], [address] Phone: [phone]   Fax:  [phone]  Physical Therapy Treatment  Patient Details  Name: Ashley Moore MRN: [id number] Date of Birth: 06/30/1951 Referring Provider: Hayden Pedro  Encounter Date: 07/20/2017      PT End of Session - 07/20/17 1656    Visit Number 16   Number of Visits 12   Date for PT Re-Evaluation 08/16/17   Authorization Type [employer]; G-code 20th visit   PT Start Time 1615   PT Stop Time 1700   PT Time Calculation (min) 45 min   Activity Tolerance Patient tolerated treatment well   Behavior During Therapy [facility] for tasks assessed/performed      Past Medical History:  Diagnosis Date  . Bowen's disease    excised 1992  . Headache   . Hx of varicella   . 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([place])   . Thyroid cancer [facility])     Past Surgical History:  Procedure Laterality Date  . BUNIONECTOMY Right   . IR GENERIC HISTORICAL  11/14/2016   IR US GUIDE VASC ACCESS RIGHT 11/14/2016 [facility]  . IR GENERIC HISTORICAL  11/14/2016   IR FLUORO GUIDE CV LINE RIGHT 11/14/2016 [facility]  . IR GENERIC HISTORICAL  12/12/2016   IR US GUIDE VASC ACCESS RIGHT 12/12/2016 [name], MD [facility]  . IR GENERIC HISTORICAL  12/12/2016   IR FLUORO GUIDE CV LINE RIGHT 12/12/2016 [name], MD [facility]  . SKIN CANCER EXCISION     bowens disease    There were no vitals filed for this visit.      Subjective Assessment - 07/20/17 1625    Subjective The third size is getting easier and more comfortable.  I brought my roller.    How long can you sit comfortably? Few minutes   Patient Stated Goals improve fecal leakage, improve the vaginal opening to improve urine stream   Currently in Pain? No/denies                      Pelvic Floor Special Questions - 07/20/17 0001    Pelvic Floor Internal Exam Patient confirms identification and approves  PT to assess the perineal and muscle integrity   Exam Type Vaginal   Palpation right side was tighter than left,            [facility] Adult PT Treatment/Exercise - 07/20/17 0001      Manual Therapy   Manual Therapy Internal Pelvic Floor;Soft tissue mobilization   Manual therapy comments manually stretched bilateral hip adductors   Soft tissue mobilization bil. hip adductors, and quadricep   Internal Pelvic Floor bil. obturator internist, and introitus          Trigger Point Dry Needling - 07/20/17 1659    Consent Given? Yes   Education Handout Provided Yes   Muscles Treated Upper Body Quadriceps;Adductor longus/brevius/magnus   Muscles Treated Lower Body Quadriceps;Adductor longus/brevius/maximus   Quadriceps Response Twitch response elicited;Palpable increased muscle length   Adductor Response Twitch response elicited;Palpable increased muscle length              PT Education - 07/20/17 1656    Education provided Yes   Education Details information on dry needling, reviewed how to use muscle roller on her legs to improve tissue mobility   Person(s) Educated Patient   Methods Explanation;Demonstration;Handout;Verbal cues   Comprehension Returned demonstration;Verbalized understanding  PT Short Term Goals - 05/02/17 1408      PT SHORT TERM GOAL #1   Title independent with initial HEP   Time 4   Period Weeks   Status Achieved     PT SHORT TERM GOAL #2   Title understand on how to use the dilator with movement and how to progress   Time 4   Period Weeks   Status Achieved     PT SHORT TERM GOAL #3   Title understand how to do self perineal massage to separate the labia minora   Time 4   Period Weeks   Status Achieved     PT SHORT TERM GOAL #4   Title fecal leakage decreased >/= 25% due to improved pelvic floor muscle control   Time 4   Period Weeks   Status Achieved           PT Long Term Goals - 07/20/17 1707      PT LONG TERM GOAL #1    Title independent with HEP   Baseline still learning   Time 8   Period Weeks   Status On-going     PT LONG TERM GOAL #2   Title ability to use the largest size dilator and understand she will have to use it for 2 years   Baseline third size   Time 8   Period Weeks   Status On-going     PT LONG TERM GOAL #3   Title urine stream able to go straight and not have to clean herself due to spraying of urine   Time 8   Period Weeks   Status Deferred     PT LONG TERM GOAL #4   Title fecal incontinence decreased >/= 75% due to increase control of pelvic floor muscles   Baseline improved by 75% better and leak one time every 2 weeks; had it today due to eating something that irritated her   Time 8   Period Weeks   Status On-going               Plan - 07/20/17 1701    Clinical Impression Statement Patient had increased bilateral hip adduction after therapy and the muscles are softer.  Patient is now on the third dilator. Patient was able to put shoes and socks on easier after therapy and bend over. Patient will benefit from skilled therapy to improve tissue mobility so she is able to use the dilator to expand the vaginal opening and improve lower extremity mobility.    Rehab Potential Good   Clinical Impairments Affecting Rehab Potential Extent of disease   PT Frequency Biweekly   PT Duration 8 weeks   PT Treatment/Interventions Biofeedback;Therapeutic activities;Therapeutic exercise;Neuromuscular re-education;Patient/family education;Passive range of motion;Scar mobilization;Manual techniques;Energy conservation   PT Next Visit Plan   soft tissue work to labia and internal,  bulging of perineum; next visit add KX; stretch inner thigh   PT Home Exercise Plan progress as needed   Consulted and Agree with Plan of Care Patient      Patient will benefit from skilled therapeutic intervention in order to improve the following deficits and impairments:  Decreased coordination,  Increased fascial restricitons, Decreased endurance, Increased muscle spasms, Decreased activity tolerance, Impaired flexibility, Decreased strength, Decreased mobility, Decreased scar mobility, Decreased skin integrity  Visit Diagnosis: Muscle weakness (generalized)  Cramp and spasm  Unspecified lack of coordination  Aftercare following surgery for neoplasm     Problem List Patient Active Problem List  Diagnosis Date Noted  . 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 Port catheter in place 11/21/2016  . 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 Anal cancer ([place]) 11/03/2016  . Cystitis 05/06/2015  . Degenerative cervical disc 04/08/2015  . Trigger point of neck 03/26/2015  . Pain in joint, ankle and foot 04/23/2013  . Visit for preventive health examination 02/13/2013  . Routine gynecological examination 02/13/2013  . Family history of breast cancer in first degree relative 02/13/2013  . Rash and nonspecific skin eruption 02/13/2013  . 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 VITAMIN D DEFICIENCY 06/25/2008  . NECK PAIN 06/02/2008  . FOOT PAIN 06/02/2008  . OSTEOPENIA 06/02/2008    Earlie Counts, PT 07/20/17 5:09 PM   [facility] [address], [place], [place], [address] Phone: [phone]   Fax:  [phone]  Name: Ashley Moore MRN: [id number] Date of Birth: 02/18/1951

## 2017-08-02 ENCOUNTER — Encounter: Payer: Self-pay | Admitting: Physical Therapy

## 2017-08-02 ENCOUNTER — Ambulatory Visit: Payer: Medicare HMO | Admitting: Physical Therapy

## 2017-08-02 DIAGNOSIS — R279 Unspecified lack of coordination: Secondary | ICD-10-CM

## 2017-08-02 DIAGNOSIS — Z483 Aftercare following surgery for neoplasm: Secondary | ICD-10-CM | POA: Diagnosis not present

## 2017-08-02 DIAGNOSIS — R252 Cramp and spasm: Secondary | ICD-10-CM | POA: Diagnosis not present

## 2017-08-02 DIAGNOSIS — M6281 Muscle weakness (generalized): Secondary | ICD-10-CM | POA: Diagnosis not present

## 2017-08-02 NOTE — Therapy (Signed)
[facility] [address], [place], [place], [address] Phone: [phone]   Fax:  [phone]  Physical Therapy Treatment  Patient Details  Name: Ashley Moore MRN: [id number] Date of Birth: 04-Aug-1951 Referring Provider: Hayden Pedro, NP   Encounter Date: 08/02/2017  PT End of Session - 08/02/17 1536    Visit Number  17    Number of Visits  12    Date for PT Re-Evaluation  08/16/17    Authorization Type  [employer]; G-code 20th visit    PT Start Time  1530    PT Stop Time  1610    PT Time Calculation (min)  40 min    Activity Tolerance  Patient tolerated treatment well    Behavior During Therapy  [facility] for tasks assessed/performed       Past Medical History:  Diagnosis Date  . Bowen's disease    excised 1992  . Headache   . Hx of varicella   . 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([place])   . Thyroid cancer [facility])     Past Surgical History:  Procedure Laterality Date  . BUNIONECTOMY Right   . IR GENERIC HISTORICAL  11/14/2016   IR US GUIDE VASC ACCESS RIGHT 11/14/2016 [facility]  . IR GENERIC HISTORICAL  11/14/2016   IR FLUORO GUIDE CV LINE RIGHT 11/14/2016 [facility]  . IR GENERIC HISTORICAL  12/12/2016   IR US GUIDE VASC ACCESS RIGHT 12/12/2016 [name], MD [facility]  . IR GENERIC HISTORICAL  12/12/2016   IR FLUORO GUIDE CV LINE RIGHT 12/12/2016 [name], MD [facility]  . SKIN CANCER EXCISION     bowens disease    There were no vitals filed for this visit.  Subjective Assessment - 08/02/17 1532    Subjective  Yesterday I was bleeding from the vagina from the dilator. It was blood was watery. Bowel movements with a few normal days then will have many all afternoon. I have see doctores in December. I could not tell the dry needling has helped.     Pertinent History  Diagnosed with anal cancer 6 weeks ago but reports being misdiagnosed by 3 doctors over the past year as having hemorrhoids. They began chemo  and radiation simultaneously about a month ago. 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    How long can you sit comfortably?  Few minutes    Patient Stated Goals  improve fecal leakage, improve the vaginal opening to improve urine stream    Currently in Pain?  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   No    Is the patient reluctant to leave their home because of a fear of falling?   No  [place] residence      Prior Function   Level of Independence  Independent    Vocation  Full time employment    Vocation Requirements  standing      Cognition   Overall Cognitive Status  Within Functional Limits for tasks assessed      Observation/Other Assessments   Focus on Therapeutic Outcomes (FOTO)   60% limitation      Posture/Postural Control   Posture/Postural Control  No significant limitations                  [facility] Adult PT Treatment/Exercise - 08/02/17 0001      Manual Therapy   Manual Therapy  Soft tissue mobilization    Manual therapy comments  manually stretched bil. hip adductors, quads, and hip flexors    Soft tissue mobilization  bilateral hip adductors, bil. quads, bil. hamstring, bil. groins               PT Short Term Goals - 05/02/17 1408      PT SHORT TERM GOAL #1   Title  independent with initial HEP    Time  4    Period  Weeks    Status  Achieved       PT SHORT TERM GOAL #2   Title  understand on how to use the dilator with movement and how to progress    Time  4    Period  Weeks    Status  Achieved      PT SHORT TERM GOAL #3   Title  understand how to do self perineal massage to separate the labia minora    Time  4    Period  Weeks    Status  Achieved      PT SHORT TERM GOAL #4   Title  fecal leakage decreased >/= 25% due to improved pelvic floor muscle control    Time  4    Period  Weeks    Status  Achieved        PT Long Term Goals - 08/02/17 1538      PT LONG TERM GOAL #1   Title  independent with HEP    Baseline  still learning    Time  8    Period  Weeks    Status  On-going      PT LONG TERM GOAL #2   Title  ability to use the largest size dilator and understand she will have to use it for 2 years    Baseline  third size    Time  8    Period  Weeks    Status  On-going      PT LONG TERM GOAL #4   Title  fecal incontinence decreased >/= 75% due to increase control of pelvic floor muscles    Baseline  improved by 75% better and leak one time every 2 weeks; had it today due to eating something that irritated her    Time  8    Period  Weeks    Status  On-going            Plan - 08/02/17 1542    Clinical Impression Statement  Patient has tight hip adductors, quadriceps, and hamstring.  Patient had increase muscle mobility after soft tissue work and stretches.  No internal soft tissue work due to her using the dilator yesterday and had irritation with some mucous wtih blood.  Patient  will benefit from skilled therapy to improve tissue mobility so she is able to use the dilator to expand the vaginal opening and improve lower extremity mobility.     Rehab Potential  Good    Clinical Impairments Affecting Rehab Potential  Extent of disease    PT Frequency  Biweekly    PT Duration  8 weeks    PT Treatment/Interventions  Biofeedback;Therapeutic activities;Therapeutic exercise;Neuromuscular  re-education;Patient/family education;Passive range of motion;Scar mobilization;Manual techniques;Energy conservation    PT Next Visit Plan    soft tissue work to labia and internal,  bulging of perineum; next visit add KX; stretch inner thigh; write renewal    PT Home Exercise Plan  progress as needed    Consulted and Agree with Plan of Care  Patient       Patient will benefit from skilled therapeutic intervention in order to improve the following deficits and impairments:  Decreased coordination, Increased fascial restricitons, Decreased endurance, Increased muscle spasms, Decreased activity tolerance, Impaired flexibility, Decreased strength, Decreased mobility, Decreased scar mobility, Decreased skin integrity  Visit Diagnosis: Muscle weakness (generalized)  Cramp and spasm  Unspecified lack of coordination  Aftercare following surgery for neoplasm     Problem List Patient Active Problem List   Diagnosis Date Noted  . 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 Port catheter in place 11/21/2016  . 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 Anal cancer ([place]) 11/03/2016  . Cystitis 05/06/2015  . Degenerative cervical disc 04/08/2015  . Trigger point of neck 03/26/2015  . Pain in joint, ankle and foot 04/23/2013  . Visit for preventive health examination 02/13/2013  . Routine gynecological examination 02/13/2013  . Family history of breast cancer in first degree relative 02/13/2013  . Rash and nonspecific skin eruption 02/13/2013  . 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 VITAMIN D DEFICIENCY 06/25/2008  . NECK PAIN 06/02/2008  . FOOT PAIN 06/02/2008  . OSTEOPENIA 06/02/2008    Earlie Counts, PT 08/02/17 4:21 PM   [facility] [address] [place], [place], [address] Phone: [phone]   Fax:  [phone]  Name: Ashley Moore MRN: [id number] Date of Birth: 11-05-1950

## 2017-08-16 ENCOUNTER — Encounter: Payer: Self-pay | Admitting: Physical Therapy

## 2017-08-16 ENCOUNTER — Ambulatory Visit: Payer: Medicare HMO | Admitting: Physical Therapy

## 2017-08-16 DIAGNOSIS — Z483 Aftercare following surgery for neoplasm: Secondary | ICD-10-CM | POA: Diagnosis not present

## 2017-08-16 DIAGNOSIS — M6281 Muscle weakness (generalized): Secondary | ICD-10-CM | POA: Diagnosis not present

## 2017-08-16 DIAGNOSIS — R252 Cramp and spasm: Secondary | ICD-10-CM

## 2017-08-16 DIAGNOSIS — R279 Unspecified lack of coordination: Secondary | ICD-10-CM | POA: Diagnosis not present

## 2017-08-16 NOTE — Therapy (Addendum)
[facility] [address], [place], [place], [address] Phone: [phone]   Fax:  [phone]  Physical Therapy Treatment  Patient Details  Name: Ashley Moore MRN: [id number] Date of Birth: 12/31/1950 Referring Provider: Hayden Pedro   Encounter Date: 08/16/2017  PT End of Session - 08/16/17 1534    Visit Number  18    Number of Visits  12    Date for PT Re-Evaluation  10/11/17    Authorization Type  [employer]; G-code 20th visit    PT Start Time  1530    PT Stop Time  1610    PT Time Calculation (min)  40 min    Activity Tolerance  Patient tolerated treatment well    Behavior During Therapy  [facility] for tasks assessed/performed       Past Medical History:  Diagnosis Date  . Bowen's disease    excised 1992  . Headache   . Hx of varicella   . 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([place])   . Thyroid cancer [facility])     Past Surgical History:  Procedure Laterality Date  . BUNIONECTOMY Right   . IR GENERIC HISTORICAL  11/14/2016   IR US GUIDE VASC ACCESS RIGHT 11/14/2016 [facility]  . IR GENERIC HISTORICAL  11/14/2016   IR FLUORO GUIDE CV LINE RIGHT 11/14/2016 [facility]  . IR GENERIC HISTORICAL  12/12/2016   IR US GUIDE VASC ACCESS RIGHT 12/12/2016 [name], MD [facility]  . IR GENERIC HISTORICAL  12/12/2016   IR FLUORO GUIDE CV LINE RIGHT 12/12/2016 [name], MD [facility]  . SKIN CANCER EXCISION     bowens disease    There were no vitals filed for this visit.  Subjective Assessment - 08/16/17 1532    Subjective  I am not on the next dilator. The pain is intense to get the third one in so I will need to work on the second dilator.     Pertinent History  Diagnosed with anal cancer 6 weeks ago but reports being misdiagnosed by 3 doctors over the past year as having hemorrhoids. They began chemo and radiation simultaneously about a month ago. 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    How long can you sit  comfortably?  Few minutes    Patient Stated Goals  improve fecal leakage, improve the vaginal opening to improve urine stream    Currently in Pain?  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    Referring Provider  Hayden Pedro    Onset Date/Surgical Date  11/26/16    Prior Therapy  none      Precautions   Precautions  Other (comment)    Precaution Comments  Cancer      Restrictions   Weight Bearing Restrictions  No      [place] residence      Prior Function   Level of Independence  Independent    Vocation  Full time employment    Vocation Requirements  standing      Cognition   Overall Cognitive Status  Within Functional Limits for tasks assessed      Observation/Other Assessments   Focus on Therapeutic Outcomes (FOTO)   60% limitation  Posture/Postural Control   Posture/Postural Control  No significant limitations      ROM / Strength   AROM / PROM / Strength  AROM;PROM;Strength      AROM   Overall AROM Comments  Lumbar ROM is full      PROM   Left Hip Extension  10    Left Hip Flexion  105    Left Hip ABduction  18      Strength   Overall Strength Comments  bil. hip abduction 4/5; abdominal strength is 3/5      Right Hip   Right Hip Extension  0    Right Hip Flexion  110    Right Hip ABduction  20      Transfers   Transfers  Not assessed      Ambulation/Gait   Ambulation/Gait  No               Pelvic Floor Special Questions - 08/16/17 0001    Pelvic Floor Internal Exam  Patient confirms identification and approves PT to assess the perineal and muscle integrity    Exam Type  Vaginal    Palpation  can fit 1 inch diameter    Strength  good squeeze, good lift, able to hold agaisnt strong resistance        [facility] Adult PT Treatment/Exercise - 08/16/17  0001      Manual Therapy   Manual Therapy  Internal Pelvic Floor;Myofascial release    Myofascial Release  release around the perineum on outside and one finger in the intoritus releasing the fascia    Internal Pelvic Floor  around the introitus  edges using a fascial release and going very  slowly and final result with two tips of therapist finger into the introitus               PT Short Term Goals - 05/02/17 1408      PT SHORT TERM GOAL #1   Title  independent with initial HEP    Time  4    Period  Weeks    Status  Achieved      PT SHORT TERM GOAL #2   Title  understand on how to use the dilator with movement and how to progress    Time  4    Period  Weeks    Status  Achieved      PT SHORT TERM GOAL #3   Title  understand how to do self perineal massage to separate the labia minora    Time  4    Period  Weeks    Status  Achieved      PT SHORT TERM GOAL #4   Title  fecal leakage decreased >/= 25% due to improved pelvic floor muscle control    Time  4    Period  Weeks    Status  Achieved        PT Long Term Goals - 08/16/17 1625      PT LONG TERM GOAL #1   Title  independent with HEP    Baseline  still learning    Time  8    Period  Weeks    Status  On-going      PT LONG TERM GOAL #2   Title  ability to use the largest size dilator and understand she will have to use it for 2 years    Baseline  betwee second to third size    Time  8  Period  Weeks    Status  On-going      PT LONG TERM GOAL #4   Title  fecal incontinence decreased >/= 75% due to increase control of pelvic floor muscles    Baseline  improved by 75% better and leak one time every 2 weeks; had it today due to eating something that irritated her    Time  8    Period  Weeks    Status  On-going      Additional Long Term Goals   Additional Long Term Goals  Yes      PT LONG TERM GOAL #6   Title  FOTO score is </= 50% limitation    Time  8    Period  Weeks    Status  On-going       PT LONG TERM GOAL #7   Title  increase bilateral hip flexion and abduction >/= 5 degrees to make it easier to place the dilator into the introitus    Time  8    Period  Weeks    Status  New    Target Date  10/11/17            Plan - 08/16/17 1619    Clinical Impression Statement  Patient is able to tolerate 1 inch diameter of therapist fingers in the introitus when therapist goes very slowly with myofascial release over 25 minute time frame.  Patient has tightness in the labia area due to the radiation.  Patient hip abduction  and flexion PROM  is limited by 10-15 degrees.  Right hip extension PROM is 0 degrees.  Patient has increased abdominal strength to 3/5.  Patient is working between the second and third dilator to increase the introitus.  Pelvic floor strength is 4/5.  Patient has been instructed on ways to stretch and massage the hip muscles to expand the hip motion and improve tissue mobilty due to changes from radiation.  Patient does not have a steady stream of the urine due to the two labia minora fused and enlarged.  Patient will benefit from skilled therapy to improve tissue mobility so she is to use the dilator to expand the vaginal opening and improve lower extremity mobility.     Rehab Potential  Good    Clinical Impairments Affecting Rehab Potential  Extent of disease    PT Frequency  Biweekly    PT Duration  8 weeks    PT Treatment/Interventions  Biofeedback;Therapeutic activities;Therapeutic exercise;Neuromuscular re-education;Patient/family education;Passive range of motion;Scar mobilization;Manual techniques;Energy conservation    PT Next Visit Plan    soft tissue work to labia and internal,  bulging of perineum; next visit add KX; stretch inner thigh    PT Home Exercise Plan  progress as needed    Recommended Other Services  recert sent to MD    Consulted and Agree with Plan of Care  Patient       Patient will benefit from skilled therapeutic intervention in order  to improve the following deficits and impairments:  Decreased coordination, Increased fascial restricitons, Decreased endurance, Increased muscle spasms, Decreased activity tolerance, Impaired flexibility, Decreased strength, Decreased mobility, Decreased scar mobility, Decreased skin integrity  Visit Diagnosis: Muscle weakness (generalized) - Plan: PT plan of care cert/re-cert  Cramp and spasm - Plan: PT plan of care cert/re-cert  Unspecified lack of coordination - Plan: PT plan of care cert/re-cert  Aftercare following surgery for neoplasm - Plan: PT plan of care cert/re-cert     Problem List  Patient Active Problem List   Diagnosis Date Noted  . 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 Port catheter in place 11/21/2016  . 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 Anal cancer ([place]) 11/03/2016  . Cystitis 05/06/2015  . Degenerative cervical disc 04/08/2015  . Trigger point of neck 03/26/2015  . Pain in joint, ankle and foot 04/23/2013  . Visit for preventive health examination 02/13/2013  . Routine gynecological examination 02/13/2013  . Family history of breast cancer in first degree relative 02/13/2013  . Rash and nonspecific skin eruption 02/13/2013  . 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 VITAMIN D DEFICIENCY 06/25/2008  . NECK PAIN 06/02/2008  . FOOT PAIN 06/02/2008  . OSTEOPENIA 06/02/2008    Earlie Counts, PT 08/16/17 4:29 PM   [facility] [address], [place], [place], [address] Phone: [phone]   Fax:  [phone]  Name: Ashley Moore MRN: [id number] Date of Birth: [birth year]  PHYSICAL THERAPY DISCHARGE SUMMARY  Visits from Start of Care: 18  Current functional level related to goals / functional outcomes: See above. Unable to reassess patient due to her not returning.   Remaining deficits: See above.   Education / Equipment: HEP Plan: Patient agrees to discharge.   Patient goals were not met. Patient is being discharged due to not returning since the last visit. thank you for the referral. Earlie Counts, PT 10/16/17 10:09 AM   ?????

## 2017-08-31 ENCOUNTER — Ambulatory Visit: Payer: Self-pay

## 2017-08-31 ENCOUNTER — Ambulatory Visit: Payer: Medicare HMO | Attending: Radiation Oncology | Admitting: Physical Therapy

## 2017-08-31 ENCOUNTER — Encounter: Payer: Self-pay | Admitting: Physical Therapy

## 2017-08-31 DIAGNOSIS — R252 Cramp and spasm: Secondary | ICD-10-CM

## 2017-08-31 DIAGNOSIS — M6281 Muscle weakness (generalized): Secondary | ICD-10-CM

## 2017-08-31 DIAGNOSIS — Z483 Aftercare following surgery for neoplasm: Secondary | ICD-10-CM | POA: Insufficient documentation

## 2017-08-31 DIAGNOSIS — C210 Malignant neoplasm of anus, unspecified: Secondary | ICD-10-CM | POA: Diagnosis not present

## 2017-08-31 DIAGNOSIS — R279 Unspecified lack of coordination: Secondary | ICD-10-CM | POA: Insufficient documentation

## 2017-08-31 NOTE — Therapy (Signed)
[facility] [address] [place], [place], [address] Phone: [phone]   Fax:  [phone]  Physical Therapy Treatment  Patient Details  Name: Ashley Moore MRN: [id number] Date of Birth: 01-02-1951 Referring Provider: Hayden Pedro   Encounter Date: 08/31/2017    Past Medical History:  Diagnosis Date  . Bowen's disease    excised 1992  . Headache   . Hx of varicella   . 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([place])   . Thyroid cancer [facility])     Past Surgical History:  Procedure Laterality Date  . BUNIONECTOMY Right   . IR GENERIC HISTORICAL  11/14/2016   IR US GUIDE VASC ACCESS RIGHT 11/14/2016 [facility]  . IR GENERIC HISTORICAL  11/14/2016   IR FLUORO GUIDE CV LINE RIGHT 11/14/2016 [facility]  . IR GENERIC HISTORICAL  12/12/2016   IR US GUIDE VASC ACCESS RIGHT 12/12/2016 [name], MD [facility]  . IR GENERIC HISTORICAL  12/12/2016   IR FLUORO GUIDE CV LINE RIGHT 12/12/2016 [name], MD [facility]  . SKIN CANCER EXCISION     bowens disease    There were no vitals filed for this visit.  Subjective Assessment - 08/31/17 1539    Subjective  I just got back from Dr. Quentin Cornwall and things are progressing as they should.  Patient ate lemons and had an allergic reaction. I had diarrhea all day with mucous. I have blisters on the anal region. I fell last night. I am on #3 dilator and it is getting more comfortable.     Pertinent History  Diagnosed with anal cancer 6 weeks ago but reports being misdiagnosed by 3 doctors over the past year as having hemorrhoids. They began chemo and radiation simultaneously about a month ago. 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    How long can you sit comfortably?  Few minutes    Patient Stated Goals  improve fecal leakage, improve the vaginal opening to improve urine stream    Currently in Pain?  No/denies         Patient told therapist she fell on right shoulder.  She placed a brace on the right wrist.  Patient showed the therapist her wrist. The radius is sticking out further that the left.  Patient called MD while in therapy to see what they want to do.    Earlie Counts, PT 08/31/17 4:05 PM                         PT Short Term Goals - 05/02/17 1408      PT SHORT TERM GOAL #1   Title  independent with initial HEP    Time  4    Period  Weeks    Status  Achieved      PT SHORT TERM GOAL #2   Title  understand on how to use the dilator with movement and how to progress    Time  4    Period  Weeks    Status  Achieved      PT SHORT TERM GOAL #3   Title  understand how to do self perineal massage to separate the labia minora    Time  4    Period  Weeks    Status  Achieved      PT SHORT TERM GOAL #4   Title  fecal leakage decreased >/= 25% due to improved pelvic floor muscle control  Time  4    Period  Weeks    Status  Achieved        PT Long Term Goals - 08/16/17 1625      PT LONG TERM GOAL #1   Title  independent with HEP    Baseline  still learning    Time  8    Period  Weeks    Status  On-going      PT LONG TERM GOAL #2   Title  ability to use the largest size dilator and understand she will have to use it for 2 years    Baseline  betwee second to third size    Time  8    Period  Weeks    Status  On-going      PT LONG TERM GOAL #4   Title  fecal incontinence decreased >/= 75% due to increase control of pelvic floor muscles    Baseline  improved by 75% better and leak one time every 2 weeks; had it today due to eating something that irritated her    Time  8    Period  Weeks    Status  On-going      Additional Long Term Goals   Additional Long Term Goals  Yes      PT LONG TERM GOAL #6   Title  FOTO score is </= 50% limitation    Time  8    Period  Weeks    Status  On-going      PT LONG TERM GOAL #7   Title  increase bilateral hip flexion and abduction >/= 5 degrees to make it easier to place  the dilator into the introitus    Time  8    Period  Weeks    Status  New    Target Date  10/11/17              Patient will benefit from skilled therapeutic intervention in order to improve the following deficits and impairments:     Visit Diagnosis: Muscle weakness (generalized)  Cramp and spasm  Unspecified lack of coordination  Aftercare following surgery for neoplasm     Problem List Patient Active Problem List   Diagnosis Date Noted  . 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 Port catheter in place 11/21/2016  . 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 Anal cancer ([place]) 11/03/2016  . Cystitis 05/06/2015  . Degenerative cervical disc 04/08/2015  . Trigger point of neck 03/26/2015  . Pain in joint, ankle and foot 04/23/2013  . Visit for preventive health examination 02/13/2013  . Routine gynecological examination 02/13/2013  . Family history of breast cancer in first degree relative 02/13/2013  . Rash and nonspecific skin eruption 02/13/2013  . 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 VITAMIN D DEFICIENCY 06/25/2008  . NECK PAIN 06/02/2008  . FOOT PAIN 06/02/2008  . OSTEOPENIA 06/02/2008    GRAY,CHERYL 08/31/2017, 3:59 PM  [facility] [address] [place], [place], [address] Phone: [phone]   Fax:  [phone]  Name: Ashley Moore MRN: [id number] Date of Birth: [birth year]

## 2017-08-31 NOTE — Telephone Encounter (Signed)
Pt. called to report fall last night approx. 10:00 PM;  Reported a fall onto carpeted floor and hit right arm,leg buttocks.  Denied any cuts or bruises.  Stated she is concerned she may have broken her (R) wrist.  Stated there is mild swelling of the right wrist.  Also reported the inner aspect of the right wrist bone appears to be protruding more, but "not through the skin." Stated there is some increased pink color to skin in wrist region.  Stated she is able to move the hand and wrist without pain.  Reported the (R) thumb seems to be a little stiff.  C/o "creaking" of right wrist when she moves it.  Stated she took Meloxicam at about 1:00 PM today, as she had some pain at that time.  Did state she had a broken right hand in the past, and isn't sure if the appearance of her wrist is normally this way.  Per protocol, appt. Scheduled 12/14; Care advice per protocol.  Verb understanding.          Reason for Disposition . [1] High-risk adult (e.g., age > 69, osteoporosis, chronic steroid use) AND [2] still hurts  Answer Assessment - Initial Assessment Questions 1. MECHANISM: "How did the injury happen?"     Fell last night ; tripped over shoes ; hit right wrist, arm, leg, buttocks 2. ONSET: "When did the injury happen?" (Minutes or hours ago)      12/12 approx. 10:00 PM; hit carpet on floor  3. APPEARANCE of INJURY: "What does the injury look like?"     Wrist bone protrusion on inner aspect; some swelling; increased pink color  4. SEVERITY: "Can you use the hand normally?" "Can you bend your fingers into a ball and then fully open them?"     Able to move the wrist "normally" ; stiffness in thumb  5. SIZE: For cuts, bruises, or swelling, ask: "How large is it?" (e.g., inches or centimeters;  entire hand or wrist)      Mild swelling on inner aspect of wrist 6. PAIN: "Is there pain?" If so, ask: "How bad is the pain?"  (Scale 1-10; or mild, moderate, severe)     Mild pain ; 1/10  7. TETANUS: For any  breaks in the skin, ask: "When was the last tetanus booster?"     N/A 8. OTHER SYMPTOMS: "Do you have any other symptoms?"      None 9. PREGNANCY: "Is there any chance you are pregnant?" "When was your last menstrual period?"     no  Protocols used: HAND AND WRIST INJURY-A-AH

## 2017-09-01 ENCOUNTER — Ambulatory Visit: Payer: Medicare HMO | Admitting: Internal Medicine

## 2017-09-01 ENCOUNTER — Encounter: Payer: Self-pay | Admitting: Internal Medicine

## 2017-09-01 VITALS — BP 102/70 | HR 81 | Temp 97.9°F | Ht 62.0 in | Wt 177.6 lb

## 2017-09-01 DIAGNOSIS — S66911A Strain of unspecified muscle, fascia and tendon at wrist and hand level, right hand, initial encounter: Secondary | ICD-10-CM

## 2017-09-01 NOTE — Patient Instructions (Signed)
Call or return to clinic prn if these symptoms worsen or fail to improve as anticipated.

## 2017-09-01 NOTE — Progress Notes (Signed)
Subjective:    Patient ID: EBONYE READE, female    DOB: [birth year], 66 y.o.   MRN: [id number]  HPI  66 year old patient who tripped and fell 2 days ago injuring her right wrist area.  The pain is very minimal but she was concerned about a bony prominence involving the radial aspect of the wrist. She has a history of anal cancer and is status post chemotherapy earlier this year.  Evaluation included PET scanning that revealed no abnormalities in the vicinity of the right wrist  Past Medical History:  Diagnosis Date  . Bowen's disease    excised 1992  . Headache   . Hx of varicella   . 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([place])   . Thyroid cancer [facility])      Social History   Socioeconomic History  . Marital status: Widowed    Spouse name: Not on file  . Number of children: 0  . Years of education: Not on file  . Highest education level: Not on file  Social Needs  . Financial resource strain: Not on file  . Food insecurity - worry: Not on file  . Food insecurity - inability: Not on file  . Transportation needs - medical: Not on file  . Transportation needs - non-medical: Not on file  Occupational History  . Not on file  Tobacco Use  . Smoking status: Former Research scientist (life sciences)  . Smokeless tobacco: Former Systems developer    Quit date: 09/20/1995  Substance and Sexual Activity  . Alcohol use: Yes    Alcohol/week: 0.0 oz    Comment: occasional  . Drug use: Yes    Types: Marijuana  . Sexual activity: No  Other Topics Concern  . Not on file  Social History Narrative   H H  of 1      5 pets.   She is a former smoker   Retired Engineer, mining fine arts   Husband smokes he has active heart disease and vascular disease   etoh   Red wine  1 per night.    Tea green tea and earl gray    [place]oved from [place] to [place] area in January 07, 1984   1 pregnancy   Husband died spring  2016 cv   Sister died 04/09/2015  Bone cancer              Past Surgical History:  Procedure Laterality Date  . BUNIONECTOMY  Right   . IR GENERIC HISTORICAL  11/14/2016   IR US GUIDE VASC ACCESS RIGHT 11/14/2016 [facility]  . IR GENERIC HISTORICAL  11/14/2016   IR FLUORO GUIDE CV LINE RIGHT 11/14/2016 [facility]  . IR GENERIC HISTORICAL  12/12/2016   IR US GUIDE VASC ACCESS RIGHT 12/12/2016 [name], MD [facility]  . IR GENERIC HISTORICAL  12/12/2016   IR FLUORO GUIDE CV LINE RIGHT 12/12/2016 [name], MD [facility]  . SKIN CANCER EXCISION     bowens disease    Family History  Problem Relation Age of Onset  . Lung cancer Mother        lung  . Pancreatitis Father   . Breast cancer Sister   . Breast cancer Maternal Grandmother        breast   . Breast cancer Maternal Aunt        breast  . Melanoma Sister     Allergies  Allergen Reactions  . Dilaudid [Hydromorphone Hcl]     Throws up  Cannot tolerate, IV or IM   .  Benadryl [Diphenhydramine] Other (See Comments)    Tingle all over   . Melatonin     Tingle all over   . Penicillins Rash    Current Outpatient Medications on File Prior to Visit  Medication Sig Dispense Refill  . conjugated estrogens (PREMARIN) vaginal cream Place 1 Applicatorful vaginally at bedtime. Once twice a week    . gabapentin (NEURONTIN) 100 MG capsule TAKE 1 CAPSULE THREE TIMES DAILY (Patient taking differently: Take 2 at night) 270 capsule 3  . meloxicam (MOBIC) 15 MG tablet TAKE 1 TABLET EVERY DAY 90 tablet 0  . sertraline (ZOLOFT) 50 MG tablet Take 1 tablet (50 mg total) by mouth daily. 90 tablet 1  . tiZANidine (ZANAFLEX) 4 MG tablet TAKE 1 TABLET EVERY 8 HOURS AS NEEDED FOR MUSCLE SPASM(S) 90 tablet 5   No current facility-administered medications on file prior to visit.     BP 102/70 (BP Location: Left Arm, Patient Position: Sitting, Cuff Size: Normal)   Pulse 81   Temp 97.9 F (36.6 C) (Oral)   Ht 5\' 2"  (1.575 m)   Wt 177 lb 9.6 oz (80.6 kg)   SpO2 93%   BMI 32.48 kg/m     Review of Systems  Constitutional: Negative.   Musculoskeletal:        Right wrist pain radial aspect       Objective:   Physical Exam  Constitutional: She appears well-developed and well-nourished. No distress.  Musculoskeletal:  Slightly prominent right distal radius.  No localized tenderness or soft tissue swelling full range of motion of the wrist.  No pain with varus and valgus stress          Assessment & Plan:   Mild right wrist strain.  Doubt abnormality of the right distal radius.  Will observe at this point History of anal cancer.  Follow-up oncology  Nyoka Cowden

## 2017-09-06 ENCOUNTER — Ambulatory Visit: Payer: Self-pay | Admitting: Neurology

## 2017-09-20 ENCOUNTER — Telehealth: Payer: Self-pay | Admitting: *Deleted

## 2017-09-20 NOTE — Telephone Encounter (Signed)
Pt returned call to nurse.  Was informed that pt experienced " different feeling " after voiding for 3 - 4 days.  Denied painful voiding, denied unusual foul odor, urine light yellow color ( pt uses Premarin cream, so urine might be cloudy at times ).  Instructed pt to contact her PCP for above symptoms.  Encouraged pt to increase po fluids as tolerated including cranberry juice. Pt also wanted to let Lattie Haw, NP know that for past 3 - 4 months, pt experienced " dot " like blood with bms.  Stated blood with bms occurred intermittently.   Message routed to Dr. Benay Spice and Lattie Haw, NP.

## 2017-09-20 NOTE — Telephone Encounter (Signed)
Pt called and left message about possibility of having UTI.   Called pt back with no answer.  Left message requesting a call back to nurse.

## 2017-09-21 ENCOUNTER — Ambulatory Visit: Payer: Medicare HMO | Admitting: Physical Therapy

## 2017-09-21 ENCOUNTER — Telehealth: Payer: Self-pay | Admitting: *Deleted

## 2017-09-21 NOTE — Telephone Encounter (Signed)
Message from pt reporting she has rectal bleeding after passing gas. Returned call, pt stated she notices "pinpoint drops" of blood and mucous about 4 hours after BM. She is concerned because she hasn't felt well the past few days. Reviewed with Dr. Benay Spice: Likely nothing to worry about. Get in to see the surgeons if bleeding increases. Called pt with these instructions, she voiced appreciation for call.

## 2017-09-22 ENCOUNTER — Other Ambulatory Visit: Payer: Self-pay | Admitting: Neurology

## 2017-09-25 ENCOUNTER — Other Ambulatory Visit: Payer: Self-pay | Admitting: Internal Medicine

## 2017-09-25 MED ORDER — SERTRALINE HCL 50 MG PO TABS: 50.0000 mg | ORAL_TABLET | Freq: Every day | ORAL | 1 refills | Status: DC

## 2017-09-25 NOTE — Progress Notes (Signed)
Chief Complaint  Patient presents with  . Urinary Tract Infection    symptoms x 1.5 weeks. Urinary frequency, urgency, pain in lower levic region. Some fever  and pt states that she developed a rash from her neck down - worse on lower portion of body.     HPI: Ashley Moore 67 y.o.   sda   Feels like uti      Sx for 10 days and ?  Rash now  ongoing  perineal rectal Sunday spread  has abd pain    Tightening after  uirnaing and cloudy .   odd .  No fever.  But chills Rash on perineum and then spread to rest of body using calamine.   abd pain  Med to lateral  sahrp stabing.   Diarrhea to constipation and  Constipation yetserday and had blood clot  this am .  Hx anal cancer  just checked last month   And felt to have radiation effects but no cancer  Using desitin and premarin ROS: See pertinent positives and negatives per HPI.  Past Medical History:  Diagnosis Date  . Bowen's disease    excised 1992  . Headache   . Hx of varicella   . 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([place])   . Thyroid cancer ([place])     Family History  Problem Relation Age of Onset  . Lung cancer Mother        lung  . Pancreatitis Father   . Breast cancer Sister   . Breast cancer Maternal Grandmother        breast   . Breast cancer Maternal Aunt        breast  . Melanoma Sister     Social History   Socioeconomic History  . Marital status: Widowed    Spouse name: None  . Number of children: 0  . Years of education: None  . Highest education level: None  Social Needs  . Financial resource strain: None  . Food insecurity - worry: None  . Food insecurity - inability: None  . Transportation needs - medical: None  . Transportation needs - non-medical: None  Occupational History  . None  Tobacco Use  . Smoking status: Former Research scientist (life sciences)  . Smokeless tobacco: Former Systems developer    Quit date: 09/20/1995  Substance and Sexual Activity  . Alcohol use: Yes    Alcohol/week: 0.0 oz    Comment: occasional  . Drug use: Yes   Types: Marijuana  . Sexual activity: No  Other Topics Concern  . None  Social History Narrative   H H  of 1      5 pets.   She is a former smoker   Retired Engineer, mining fine arts   Husband smokes he has active heart disease and vascular disease   etoh   Red wine  1 per night.    Tea green tea and earl gray    [place]oved from [place] to [place] area in 05-Jan-1984   1 pregnancy   Husband died spring  2016 cv   Sister died April 07, 2015  Bone cancer              Outpatient Medications Prior to Visit  Medication Sig Dispense Refill  . conjugated estrogens (PREMARIN) vaginal cream Place 1 Applicatorful vaginally at bedtime. Once twice a week    . gabapentin (NEURONTIN) 100 MG capsule TAKE 1 CAPSULE THREE TIMES DAILY (Patient taking differently: Take 2 at night) 270 capsule 3  .  meloxicam (MOBIC) 15 MG tablet TAKE 1 TABLET EVERY DAY 90 tablet 0  . sertraline (ZOLOFT) 50 MG tablet Take 1 tablet (50 mg total) by mouth daily. 90 tablet 1  . tiZANidine (ZANAFLEX) 4 MG tablet TAKE 1 TABLET EVERY 8 HOURS AS NEEDED FOR MUSCLE SPASM(S) 90 tablet 5   No facility-administered medications prior to visit.      EXAM:  BP 118/62 (BP Location: Left Arm, Patient Position: Sitting, Cuff Size: Normal)   Pulse 68   Temp 98 F (36.7 C) (Oral)   Wt 177 lb 9.6 oz (80.6 kg)   BMI 32.48 kg/m   Body mass index is 32.48 kg/m.  GENERAL: vitals reviewed and listed above, alert, oriented, appears well hydrated and in no acute distress Abdomen:  Sof,t normal bowel sounds without hepatosplenomegaly, no guarding rebound or masses no CVA tenderness Skin rash  Discrete pink red bumps   Both Axilla  And  Inguinal area without intertrigo   perineum grossly   Macerated looking with   desitin on top  No lesion noted.  . CV: HRRR, no clubbing cyanosis or  peripheral edema nl cap refill  MS: moves all extremities without noticeable focal  abnormality PSYCH: pleasant and cooperative, no obvious depression or  anxiety   ua    Lab Results  Component Value Date   WBC 4.3 12/27/2016   HGB 10.3 (L) 12/27/2016   HCT 31.1 (L) 12/27/2016   PLT 158 12/27/2016   GLUCOSE 84 04/13/2017   CHOL 165 03/11/2015   TRIG 66.0 03/11/2015   HDL 67.30 03/11/2015   LDLCALC 85 03/11/2015   ALT 43 12/27/2016   AST 57 (H) 12/27/2016   NA 138 04/13/2017   K 4.7 04/13/2017   CL 94 (L) 12/27/2016   CREATININE 0.8 04/13/2017   BUN 18.8 04/13/2017   CO2 25 04/13/2017   TSH 4.09 10/21/2015   HGBA1C 5.5 05/11/2015   ua pos leuk and blood  ASSESSMENT AND PLAN:  Discussed the following assessment and plan:  Urinary tract infection without hematuria, site unspecified ? - hx of same   sne for ucx  - Plan: POC Urinalysis Dipstick, Urine Culture, Urine Culture  Rash  Chronic vulvitis - prob part from  radiation rx poss additional yeast  - Plan: POC Urinalysis Dipstick, Urine Culture, Urine Culture  Anal cancer ([place]) Curious rash   Because of location  Wand to be placed on antibiotic will add diflucan  And can repeat in 3 days  If  persistent or progressive then  Other evaluation . No other alam sx but said had a clot of blood with stool x 1  Needs fu with oncology gi if  Ongoing.  -Patient advised to return or notify health care team  if symptoms worsen ,persist or new concerns arise.  Patient Instructions  Curious rash looks like  Yeast but axillary and supropubic and    Perineal  No ulceration    utis sx   On top of  chonric  Perineal changes .  If on going would see  Oncology  again in regard to abd pain and  Any rectal bleeding .     consdier blood  Work if  Progressing sx  And recheck  Will let you know when  Culture results are back.     Standley Brooking. Mauria Asquith M.D.

## 2017-09-25 NOTE — Progress Notes (Signed)
Form received from [facility] requesting refills be sent to their pharmacy for the patient's Sertraline 50mg  tablet. Approved per Dr Regis Bill. Nothing further needed.

## 2017-09-26 ENCOUNTER — Ambulatory Visit: Payer: Medicare HMO | Admitting: Internal Medicine

## 2017-09-26 ENCOUNTER — Encounter: Payer: Self-pay | Admitting: Internal Medicine

## 2017-09-26 VITALS — BP 118/62 | HR 68 | Temp 98.0°F | Wt 177.6 lb

## 2017-09-26 DIAGNOSIS — C210 Malignant neoplasm of anus, unspecified: Secondary | ICD-10-CM | POA: Diagnosis not present

## 2017-09-26 DIAGNOSIS — N390 Urinary tract infection, site not specified: Secondary | ICD-10-CM

## 2017-09-26 DIAGNOSIS — R21 Rash and other nonspecific skin eruption: Secondary | ICD-10-CM

## 2017-09-26 DIAGNOSIS — N763 Subacute and chronic vulvitis: Secondary | ICD-10-CM

## 2017-09-26 LAB — POCT URINALYSIS DIPSTICK
BILIRUBIN UA: NEGATIVE
Glucose, UA: NEGATIVE
Ketones, UA: NEGATIVE
Nitrite, UA: NEGATIVE
Spec Grav, UA: 1.01 (ref 1.010–1.025)
Urobilinogen, UA: 0.2 E.U./dL
pH, UA: 6 (ref 5.0–8.0)

## 2017-09-26 MED ORDER — FLUCONAZOLE 150 MG PO TABS: 150.0000 mg | ORAL_TABLET | Freq: Once | ORAL | 0 refills | Status: AC

## 2017-09-26 MED ORDER — NITROFURANTOIN MONOHYD MACRO 100 MG PO CAPS: 100.0000 mg | ORAL_CAPSULE | Freq: Two times a day (BID) | ORAL | 0 refills | Status: AC

## 2017-09-26 NOTE — Patient Instructions (Addendum)
Curious rash looks like  Yeast but axillary and supropubic and    Perineal  No ulceration    utis sx   On top of  chonric  Perineal changes .  If on going would see  Oncology  again in regard to abd pain and  Any rectal bleeding .     consdier blood  Work if  Progressing sx  And recheck  Will let you know when  Culture results are back.

## 2017-09-27 ENCOUNTER — Telehealth: Payer: Self-pay | Admitting: Internal Medicine

## 2017-09-27 NOTE — Telephone Encounter (Signed)
Copied from [place] [id number]. Topic: Inquiry >> Sep 27, 2017  9:38 AM Ashley Moore I, NT wrote: Reason for CRM: pt call and said she doing so much Better her Rash going away.

## 2017-09-27 NOTE — Telephone Encounter (Signed)
Noted  

## 2017-09-27 NOTE — Telephone Encounter (Signed)
Will send to Dr Panosh as FYI 

## 2017-09-29 LAB — URINE CULTURE
MICRO NUMBER: 90028712
SPECIMEN QUALITY:: ADEQUATE

## 2017-09-29 NOTE — Progress Notes (Signed)
Tell patient that urine culture shows  A bacteria sensitive to medication given . Should resolve with current treatment .FU if not better  better.

## 2017-10-02 ENCOUNTER — Telehealth: Payer: Self-pay | Admitting: Internal Medicine

## 2017-10-02 ENCOUNTER — Telehealth: Payer: Self-pay | Admitting: Family Medicine

## 2017-10-02 NOTE — Telephone Encounter (Signed)
Do see notes pertaining to labs if not improving, but not sure that note was made in relation to the rash.Marland KitchenMarland Kitchen

## 2017-10-02 NOTE — Telephone Encounter (Signed)
Copied from [place] [id number]. Topic: General - Other >> Oct 02, 2017 10:29 AM Ashley Moore, RMA wrote: Reason for CRM: pt called and stated that her rash come back and is worse than it was previously pt stated that the doctor told her that if it returned that she should have blood work done but I do not see any orders placed Please contact pt with advice [phone] 

## 2017-10-02 NOTE — Telephone Encounter (Signed)
Duplicate message. 

## 2017-10-02 NOTE — Telephone Encounter (Signed)
Dr Regis Bill please advise.  I have sent a mychart msg to the patient making her aware that our phones line are down this morning.  Based on previous OV pt was to contact our office if rash symptoms persisted or worsened

## 2017-10-02 NOTE — Telephone Encounter (Signed)
Have her come in for OV tomorrow to reevaluate

## 2017-10-02 NOTE — Telephone Encounter (Signed)
Pt scheduled 10/03/17 at 145p Nothing further needed.

## 2017-10-02 NOTE — Telephone Encounter (Signed)
Copied from [place] [id number]. Topic: General - Other >> Oct 02, 2017 10:29 AM Lolita Rieger, RMA wrote: Reason for CRM: pt called and stated that her rash come back and is worse than it was previously pt stated that the doctor told her that if it returned that she should have blood work done but I do not see any orders placed Please contact pt with advice [phone] 

## 2017-10-03 ENCOUNTER — Ambulatory Visit (INDEPENDENT_AMBULATORY_CARE_PROVIDER_SITE_OTHER): Payer: Medicare HMO | Admitting: Internal Medicine

## 2017-10-03 ENCOUNTER — Encounter: Payer: Self-pay | Admitting: Internal Medicine

## 2017-10-03 VITALS — BP 128/82 | HR 80 | Temp 98.2°F | Wt 175.2 lb

## 2017-10-03 DIAGNOSIS — L282 Other prurigo: Secondary | ICD-10-CM

## 2017-10-03 DIAGNOSIS — R21 Rash and other nonspecific skin eruption: Secondary | ICD-10-CM

## 2017-10-03 MED ORDER — PREDNISONE 20 MG PO TABS: 20.0000 mg | ORAL_TABLET | Freq: Every day | ORAL | 0 refills | Status: DC

## 2017-10-03 MED ORDER — METHYLPREDNISOLONE ACETATE 80 MG/ML IJ SUSP
120.0000 mg | Freq: Once | INTRAMUSCULAR | Status: AC
  Administered 2017-10-03: 120 mg via INTRAMUSCULAR

## 2017-10-03 MED ORDER — HYDROXYZINE HCL 25 MG PO TABS: 25.0000 mg | ORAL_TABLET | Freq: Three times a day (TID) | ORAL | 1 refills | Status: DC | PRN

## 2017-10-03 NOTE — Progress Notes (Signed)
Chief Complaint  Patient presents with  . Rash    worse since last Ov. Rash from ears to knees and in scalp.     HPI: Ashley Moore 67 y.o.   utis better  rahs  Was bette for one day and then got worse  Axillary back and arms  Also     Very itchy hardd to sellp no resp sx with this.  Using calamine  No fever  ROS: See pertinent positives and negatives per HPI.  Past Medical History:  Diagnosis Date  . Bowen's disease    excised 1992  . Headache   . Hx of varicella   . 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([place])   . Thyroid cancer ([place])     Family History  Problem Relation Age of Onset  . Lung cancer Mother        lung  . Pancreatitis Father   . Breast cancer Sister   . Breast cancer Maternal Grandmother        breast   . Breast cancer Maternal Aunt        breast  . Melanoma Sister     Social History   Socioeconomic History  . Marital status: Widowed    Spouse name: None  . Number of children: 0  . Years of education: None  . Highest education level: None  Social Needs  . Financial resource strain: None  . Food insecurity - worry: None  . Food insecurity - inability: None  . Transportation needs - medical: None  . Transportation needs - non-medical: None  Occupational History  . None  Tobacco Use  . Smoking status: Former Research scientist (life sciences)  . Smokeless tobacco: Former Systems developer    Quit date: 09/20/1995  Substance and Sexual Activity  . Alcohol use: Yes    Alcohol/week: 0.0 oz    Comment: occasional  . Drug use: Yes    Types: Marijuana  . Sexual activity: No  Other Topics Concern  . None  Social History Narrative   H H  of 1      5 pets.   She is a former smoker   Retired Engineer, mining fine arts   Husband smokes he has active heart disease and vascular disease   etoh   Red wine  1 per night.    Tea green tea and earl gray    [place] in Jan 03, 1984   1 pregnancy   Husband died spring  2016 cv   Sister died 04-05-15  Bone cancer              Outpatient Medications Prior to Visit  Medication Sig Dispense Refill  . conjugated estrogens (PREMARIN) vaginal cream Place 1 Applicatorful vaginally at bedtime. Once twice a week    . gabapentin (NEURONTIN) 100 MG capsule TAKE 1 CAPSULE THREE TIMES DAILY (Patient taking differently: Take 2 at night) 270 capsule 3  . meloxicam (MOBIC) 15 MG tablet TAKE 1 TABLET EVERY DAY 90 tablet 0  . nitrofurantoin, macrocrystal-monohydrate, (MACROBID) 100 MG capsule Take 1 capsule (100 mg total) by mouth 2 (two) times daily for 7 days. 14 capsule 0  . sertraline (ZOLOFT) 50 MG tablet Take 1 tablet (50 mg total) by mouth daily. 90 tablet 1  . tiZANidine (ZANAFLEX) 4 MG tablet TAKE 1 TABLET EVERY 8 HOURS AS NEEDED FOR MUSCLE SPASM(S) 90 tablet 5   No facility-administered medications prior to visit.      EXAM:  BP 128/82 (BP  Location: Right Arm, Patient Position: Sitting, Cuff Size: Normal)   Pulse 80   Temp 98.2 F (36.8 C) (Oral)   Wt 175 lb 3.2 oz (79.5 kg)   BMI 32.04 kg/m   Body mass index is 32.04 kg/m.  GENERAL: vitals reviewed and listed above, alert, oriented, appears well hydrated and in no acute distress  Well but  Scratching forearms  HEENT: atraumatic, conjunctiva  clear, no obvious abnormalities on inspection of external nose and ears   NECK: no obvious masses on inspection palpation  LUNGS: clear to auscultation bilaterally, no wheezes, rales or rhonchi, CV: HRRR, no clubbing cyanosis or  peripheral edema nl cap refill   Skin  Rash  Bilateral forams   With excoriation  Blotchy on back  And axillary  And  Inguinal thigh rash  No palm or  Distal legs  And face is clear  No angioedema.  MS: moves all extremities without noticeable focal  abnormality PSYCH: pleasant and cooperative, no obvious depression or anxiety  Although uncomfortable with scratching   ASSESSMENT AND PLAN:  Discussed the following assessment and plan:  Rash due to allergy - Plan: methylPREDNISolone  acetate (DEPO-MEDROL) injection 120 mg  Pruritic rash Poss  Contact derm?   No hx of psoriasis    rx pruritis   Steroid    Risk benefit of medication discussed.   And hydroxyzine  Night  And zyrtec in day     Plan fu if  persistent or progressive   I dot think its her meds and rash was there before giving antibiotic for her  uti   Which seems better  -Patient advised to return or notify health care team  if symptoms worsen ,persist or new concerns arise.  Patient Instructions  This is an allergic reaction to something on your skin most likely .   Shot  Of depomedrol today and then will add   Some prednisone   Hydroxyzine at night for itching    But can cause drowsiness  And can try benadryl if works better .   Expect   Improvement in  t The next   48 hours    Cool compresses    .     Ashley Moore. Panosh M.D.

## 2017-10-03 NOTE — Patient Instructions (Signed)
This is an allergic reaction to something on your skin most likely .   Shot  Of depomedrol today and then will add   Some prednisone   Hydroxyzine at night for itching    But can cause drowsiness  And can try benadryl if works better .   Expect   Improvement in  t The next   48 hours    Cool compresses    .

## 2017-10-05 ENCOUNTER — Ambulatory Visit: Payer: Medicare HMO | Admitting: Neurology

## 2017-10-05 ENCOUNTER — Encounter: Payer: Self-pay | Admitting: Neurology

## 2017-10-05 VITALS — BP 126/76 | HR 95 | Ht 63.0 in | Wt 177.2 lb

## 2017-10-05 DIAGNOSIS — M5030 Other cervical disc degeneration, unspecified cervical region: Secondary | ICD-10-CM

## 2017-10-05 DIAGNOSIS — G4486 Cervicogenic headache: Secondary | ICD-10-CM

## 2017-10-05 DIAGNOSIS — R51 Headache: Secondary | ICD-10-CM | POA: Diagnosis not present

## 2017-10-05 NOTE — Patient Instructions (Signed)
1.  Continue gabapentin, tizanidine and meloxicam.  If stomach feels upset (due to use of meloxicam), consider omeprazole (Prilosec) daily 2.  Consider taking turmeric 500mg  once to twice daily for anti-inflammatory properties 3.  Follow up in one year or as needed.

## 2017-10-05 NOTE — Progress Notes (Signed)
NEUROLOGY FOLLOW UP OFFICE NOTE  Ashley Moore [id number]  HISTORY OF PRESENT ILLNESS: Ashley Moore is a 67 year old right-handed woman with degenerative arthritis of the cervical spine who follows up for cervicogenic headache.     UPDATE:  Medications:  gabapentin 200mg  at bedtime; tizanidine 4mg  /2 tablet in afternoon and 1 tablet at bedtime; meloxicam 15mg  daily Headaches and neck pain are improved.  Following radiation therapy for anal cancer last spring (when her neck was in a brace), she had a flare up of headache, which has since improved.  Over the past 1 to 2 months, she has occasional headache due to changes in barometric pressure.  However, headaches are brief (just seconds) and are very infrequent.  Occasionally, her stomach feels unsettled.   HISTORY: On 10/28/14, her husband fell.  When she tried to get him up, she developed this severe pain in the back of her head.  She didn't notice pulling her neck.  She has had a constant occipital headache since then.  It is located on the left side.  When she moves her neck or head slightly in any direction, she feels a burning shooting pain up to the front of the head and down the left side of her neck to the shoulder.  She denies any numbness or tingling in the back of the head or in the extremities.  She denies visual symptoms, nausea, photophobia, phonophobia or dizziness.  Besides head movement, stress and change in weather also exacerbates it.  Applying pressure to the area seems to help.     Treatment:  Tylenol, heating pad, ice, massage (only briefly helps for 10 minutes), OMM (helpful)   MRI of the brain with and without contrast from 06/15/15 showed no acute intracranial process, but did reveal degenerative changes at left C1-2.  MRI of cervical spine from 08/07/15 revealed showed moderate bilateral neural foraminal stenosis at C5, C6 and C7 nerve levels.  There was also degenerative left C1-C2 articulation with acute marrow  edema and involving the central C2 odontoid.  Follow up CT of cervical spine verified extensive C1-2 degenerative changes. PAST MEDICAL HISTORY: Past Medical History:  Diagnosis Date  . Bowen's disease    excised 1992  . Headache   . Hx of varicella   . 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([place])   . Thyroid cancer [facility])     MEDICATIONS: Current Outpatient Medications on File Prior to Visit  Medication Sig Dispense Refill  . conjugated estrogens (PREMARIN) vaginal cream Place 1 Applicatorful vaginally at bedtime. Once twice a week    . gabapentin (NEURONTIN) 100 MG capsule TAKE 1 CAPSULE THREE TIMES DAILY (Patient taking differently: Take 2 at night) 270 capsule 3  . hydrOXYzine (ATARAX/VISTARIL) 25 MG tablet Take 1 tablet (25 mg total) by mouth 3 (three) times daily as needed. For itching 30 tablet 1  . meloxicam (MOBIC) 15 MG tablet TAKE 1 TABLET EVERY DAY 90 tablet 0  . predniSONE (DELTASONE) 20 MG tablet Take 1 tablet (20 mg total) by mouth daily. Take 3,3,,2,2,1,1,, 1/.2 1./2  pills qd 14 tablet 0  . sertraline (ZOLOFT) 50 MG tablet Take 1 tablet (50 mg total) by mouth daily. 90 tablet 1  . tiZANidine (ZANAFLEX) 4 MG tablet TAKE 1 TABLET EVERY 8 HOURS AS NEEDED FOR MUSCLE SPASM(S) 90 tablet 5   No current facility-administered medications on file prior to visit.     ALLERGIES: Allergies  Allergen Reactions  . Dilaudid [Hydromorphone Hcl]     Throws  up  Cannot tolerate, IV or IM   . Benadryl [Diphenhydramine] Other (See Comments)    Tingle all over   . Melatonin     Tingle all over   . Penicillins Rash    FAMILY HISTORY: Family History  Problem Relation Age of Onset  . Lung cancer Mother        lung  . Pancreatitis Father   . Breast cancer Sister   . Breast cancer Maternal Grandmother        breast   . Breast cancer Maternal Aunt        breast  . Melanoma Sister     SOCIAL HISTORY: Social History   Socioeconomic History  . Marital status: Widowed    Spouse name: Not on file  .  Number of children: 0  . Years of education: Not on file  . Highest education level: Not on file  Social Needs  . Financial resource strain: Not on file  . Food insecurity - worry: Not on file  . Food insecurity - inability: Not on file  . Transportation needs - medical: Not on file  . Transportation needs - non-medical: Not on file  Occupational History  . Not on file  Tobacco Use  . Smoking status: Former Research scientist (life sciences)  . Smokeless tobacco: Former Systems developer    Quit date: 09/20/1995  Substance and Sexual Activity  . Alcohol use: Yes    Alcohol/week: 0.0 oz    Comment: occasional  . Drug use: Yes    Types: Marijuana  . Sexual activity: No  Other Topics Concern  . Not on file  Social History Narrative   H H  of 1      5 pets.   She is a former smoker   Retired Engineer, mining fine arts   Husband smokes he has active heart disease and vascular disease   etoh   Red wine  1 per night.    Tea green tea and earl gray    [place]oved from [place] to [place] area in 01-02-84   1 pregnancy   Husband died spring  2016 cv   Sister died 04-04-15  Bone cancer              REVIEW OF SYSTEMS: Constitutional: No fevers, chills, or sweats, no generalized fatigue, change in appetite Eyes: No visual changes, double vision, eye pain Ear, nose and throat: No hearing loss, ear pain, nasal congestion, sore throat Cardiovascular: No chest pain, palpitations Respiratory:  No shortness of breath at rest or with exertion, wheezes GastrointestinaI: No nausea, vomiting, diarrhea, abdominal pain, fecal incontinence Genitourinary:  No dysuria, urinary retention or frequency Musculoskeletal:  No neck pain, back pain Integumentary: Rash (on prednisone) Neurological: as above Psychiatric: No depression, insomnia, anxiety Endocrine: No palpitations, fatigue, diaphoresis, mood swings, change in appetite, change in weight, increased thirst Hematologic/Lymphatic:  No purpura,  petechiae. Allergic/Immunologic: Rash of unknown cause; no itchy/runny eyes, nasal congestion, recent allergic reactions  PHYSICAL EXAM: Vitals:   10/05/17 0914  BP: 126/76  Pulse: 95  SpO2: 95%   General: No acute distress.  Patient appears well-groomed.   Head:  Normocephalic/atraumatic Eyes:  Fundi examined but not visualized Neck: supple, no paraspinal tenderness, full range of motion Heart:  Regular rate and rhythm Lungs:  Clear to auscultation bilaterally Back: No paraspinal tenderness Neurological Exam: alert and oriented to person, place, and time. Attention span and concentration intact, recent and remote memory intact, fund of knowledge intact.  Speech fluent  and not dysarthric, language intact.  CN II-XII intact. Bulk and tone normal, muscle strength 5/5 throughout.  Sensation to light touch, temperature and vibration intact.  Deep tendon reflexes 2+ throughout, toes downgoing.  Finger to nose and heel to shin testing intact.  Gait normal, Romberg negative.  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  Gabapentin 200mg  at bedtime, tizanidine 2mg  in afternoon/4mg  at night, meloxicam 15mg  daily.  If stomach upset, consider omeprazole daily for protection (given daily use of meloxicam). 2.  Consider turmeric 500mg  once to twice daily for anti-inflammatory properties. 3. Follow up in one month or as needed.  Metta Clines, DO  CC:  Shanon Ace, MD

## 2017-10-20 ENCOUNTER — Other Ambulatory Visit: Payer: Self-pay | Admitting: Internal Medicine

## 2017-10-24 ENCOUNTER — Ambulatory Visit
Admission: RE | Admit: 2017-10-24 | Discharge: 2017-10-24 | Disposition: A | Discharge status: Home / Self Care | Payer: Medicare HMO | Source: Ambulatory Visit | Attending: Radiation Oncology | Admitting: Radiation Oncology

## 2017-10-24 ENCOUNTER — Other Ambulatory Visit: Payer: Self-pay

## 2017-10-24 ENCOUNTER — Encounter: Payer: Self-pay | Admitting: Radiation Oncology

## 2017-10-24 VITALS — BP 137/89 | HR 78 | Temp 97.9°F | Resp 18 | Ht 63.0 in | Wt 178.2 lb

## 2017-10-24 DIAGNOSIS — I890 Lymphedema, not elsewhere classified: Secondary | ICD-10-CM | POA: Insufficient documentation

## 2017-10-24 DIAGNOSIS — C210 Malignant neoplasm of anus, unspecified: Secondary | ICD-10-CM | POA: Insufficient documentation

## 2017-10-24 DIAGNOSIS — Q525 Fusion of labia: Secondary | ICD-10-CM

## 2017-10-24 DIAGNOSIS — Z87891 Personal history of nicotine dependence: Secondary | ICD-10-CM | POA: Diagnosis not present

## 2017-10-24 DIAGNOSIS — N7689 Other specified inflammation of vagina and vulva: Secondary | ICD-10-CM | POA: Diagnosis not present

## 2017-10-24 DIAGNOSIS — N952 Postmenopausal atrophic vaginitis: Secondary | ICD-10-CM | POA: Insufficient documentation

## 2017-10-24 DIAGNOSIS — Z08 Encounter for follow-up examination after completed treatment for malignant neoplasm: Secondary | ICD-10-CM | POA: Diagnosis not present

## 2017-10-24 DIAGNOSIS — Z85048 Personal history of other malignant neoplasm of rectum, rectosigmoid junction, and anus: Secondary | ICD-10-CM | POA: Diagnosis not present

## 2017-10-24 NOTE — Addendum Note (Signed)
Encounter addended by: Malena Edman, RN on: 10/24/2017 10:26 AM  Actions taken: Charge Capture section accepted

## 2017-10-24 NOTE — Progress Notes (Signed)
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  Interval Since Last Radiation:  10 months  11/14/16-12/23/16: 54 Gy to the pelvic nodes, anus, and bilateral groins.  Narrative: Ashley Moore is a pleasant 67 y.o. female with a history of squamous cell carcinoma of the anus. When Ashley Moore was diagnosed, Ashley Moore had advanced locoregional disease with bilateral inguinal adenopathy, PET positive presacral, left pelvic sidewall, and a lesion of the left labia consistent with a satellite lesion. Ashley Moore was treated with 5FU and radiotherapy and developed significant desquamation of the tissue. Ashley Moore has been seen due to labial agglutination by Dr. Myrene Galas and continues with premarin daily for this. Ashley Moore also is being followed by Dr. Quentin Cornwall in [place] for her anoscopy and is due to see both doctors this month. Ashley Moore comes today for follow up . Of note her last scan on 04/13/17 did not reveal any residual adenopathy or residual tumor.    On review of systems, the patient reports that Ashley Moore is doing well overall. Ashley Moore denies any chest pain, shortness of breath, cough, fevers, chills, night sweats, unintended weight changes. Ashley Moore denies any abdominal pain at this time, and reports that Ashley Moore's recovering from her diverticulitis but still has some pain at the beginning of passing stool. Ashley Moore is having bowel movements daily. Ashley Moore denies any rectal bleeding but does note some rectal drainage. Ashley Moore denies any bladder disturbances, and denies abdominal pain, nausea or vomiting. Ashley Moore denies any new musculoskeletal or joint aches or pains, new skin lesions or concerns. A complete review of systems is obtained and is otherwise negative.  Past Medical History:  Past Medical History:  Diagnosis Date  .  Bowen's disease    excised 1992  . Headache   . Hx of varicella   . 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([place])   . Thyroid cancer [facility])     Past Surgical History: Past Surgical History:  Procedure Laterality Date  . BUNIONECTOMY Right   . IR GENERIC HISTORICAL  11/14/2016   IR US GUIDE VASC ACCESS RIGHT 11/14/2016 [facility]  . IR GENERIC HISTORICAL  11/14/2016   IR FLUORO GUIDE CV LINE RIGHT 11/14/2016 [facility]  . IR GENERIC HISTORICAL  12/12/2016   IR US GUIDE VASC ACCESS RIGHT 12/12/2016 [name], MD [facility]  . IR GENERIC HISTORICAL  12/12/2016   IR FLUORO GUIDE CV LINE RIGHT 12/12/2016 [name], MD [facility]  . SKIN CANCER EXCISION     bowens disease    Social History:  Social History   Socioeconomic History  . Marital status: Widowed    Spouse name: Not on file  . Number of children: 0  . Years of education: Not on file  . Highest education level: Not on file  Social Needs  . Financial resource strain: Not on file  . Food insecurity - worry: Not on file  . Food insecurity - inability: Not on file  . Transportation needs - medical: Not on file  . Transportation needs - non-medical: Not on file  Occupational History  . Not on file  Tobacco Use  . Smoking status: Former Research scientist (life sciences)  . Smokeless tobacco: Former Systems developer    Quit date: 09/20/1995  Substance and Sexual Activity  . Alcohol use: Yes  Alcohol/week: 0.0 oz    Comment: occasional  . Drug use: Yes    Types: Marijuana  . Sexual activity: No  Other Topics Concern  . Not on file  Social History Narrative   H H  of 1      5 pets.   Ashley Moore is a former smoker   Retired Engineer, mining fine arts   Husband smokes he has active heart disease and vascular disease   etoh   Red wine  1 per night.    Tea green tea and earl gray    [place]oved [place] to [place] area in 12/26/83   1 pregnancy   Husband died spring  2016 cv   Sister died March 28, 2015  Bone cancer              Family History: Family History    Problem Relation Age of Onset  . Lung cancer Mother        lung  . Pancreatitis Father   . Breast cancer Sister   . Breast cancer Maternal Grandmother        breast   . Breast cancer Maternal Aunt        breast  . Melanoma Sister      ALLERGIES:  is allergic to dilaudid [hydromorphone hcl]; benadryl [diphenhydramine]; melatonin; and penicillins.  Meds: Current Outpatient Medications  Medication Sig Dispense Refill  . conjugated estrogens (PREMARIN) vaginal cream Place 1 Applicatorful vaginally at bedtime. Once twice a week    . meloxicam (MOBIC) 15 MG tablet TAKE 1 TABLET EVERY DAY 90 tablet 0  . sertraline (ZOLOFT) 50 MG tablet Take 1 tablet (50 mg total) by mouth daily. 90 tablet 1  . tiZANidine (ZANAFLEX) 4 MG tablet TAKE 1 TABLET EVERY 8 HOURS AS NEEDED FOR MUSCLE SPASM(S) 90 tablet 5  . gabapentin (NEURONTIN) 100 MG capsule TAKE 1 CAPSULE THREE TIMES DAILY (Patient not taking: Reported on 10/24/2017) 270 capsule 3  . hydrOXYzine (ATARAX/VISTARIL) 25 MG tablet Take 1 tablet (25 mg total) by mouth 3 (three) times daily as needed. For itching (Patient not taking: Reported on 10/24/2017) 30 tablet 1   No current facility-administered medications for this encounter.     Physical Findings:  height is 5\' 3"  (1.6 m) and weight is 178 lb 3.2 oz (80.8 kg). Her oral temperature is 97.9 F (36.6 C). Her blood pressure is 137/89 and her pulse is 78. Her respiration is 18 and oxygen saturation is 100%.  Pain Assessment Pain Score: 0-No pain/10 In general this is a well appearing caucasian female in no acute distress. Ashley Moore is alert and oriented x4 and appropriate throughout the examination. HEENT reveals that the patient is normocephalic, atraumatic. EOMs are intact. PERRLA. Skin is intact without any evidence of gross lesions. Cardiovascular exam reveals a regular rate and rhythm, no clicks rubs or murmurs are auscultated. Chest is clear to auscultation bilaterally. Lymphatic assessment is  performed and does not reveal any adenopathy in the cervical, supraclavicular, axillary, or inguinal chains. Abdomen has active bowel sounds in all quadrants and is intact. The abdomen is soft, non tender, non distended. Lower extremities are negative for pretibial pitting edema, deep calf tenderness, cyanosis or clubbing. Pelvic exam reveals normal appearing external female genitalia with hyperemic changes of the skin consistent with her prior treatment. The labia majora is agglutinated but the introitus is now visible. Speculum exam is performed and identifies the BUS areas which are normal in appearance. The cervix is not visualized well  due to discomfort of the speculum. Bimanual examination is negative for palpable mass in the pelvis, and the uterus is small and mobile. Rectovaginal exam is negative for induration or nodularity of the septum. Stool is noted in the vault without palpable tumor.  Lab Findings: Lab Results  Component Value Date   WBC 4.3 12/27/2016   HGB 10.3 (L) 12/27/2016   HCT 31.1 (L) 12/27/2016   MCV 82.3 12/27/2016   PLT 158 12/27/2016     Radiographic Findings: No results found.  Impression/Plan: 1. 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 The patient continues to be NED clinically. Ashley Moore will see Dr. Benay Spice and Ned Card, NP for  surveillance appointment in March. Ashley Moore will see Dr. Quentin [facility] at [facility] and will be due for Anoscopy later this month.  I will continue to plan to see her in one year's time. Ashley Moore is in agreement with this plan and will call sooner if Ashley Moore has questions or concerns.  2. Atrophic vaginitis. The patient continues to follow up with Dr. Myrene Galas and will continue her premarin per his recommendations.  3. Bilateral lower extremity lymphedema. The patient is doing well with managing this and will continue compression and we will follow the expectantly. 4. Breast health. The patient will be seen by her PCP next week who performs her  breast exams and will order her mammogram.    Carola Rhine, PAC

## 2017-11-07 ENCOUNTER — Ambulatory Visit (INDEPENDENT_AMBULATORY_CARE_PROVIDER_SITE_OTHER): Payer: Medicare HMO | Admitting: Internal Medicine

## 2017-11-07 ENCOUNTER — Encounter: Payer: Self-pay | Admitting: Internal Medicine

## 2017-11-07 VITALS — BP 130/70 | HR 72 | Temp 98.2°F | Ht 62.0 in | Wt 181.0 lb

## 2017-11-07 DIAGNOSIS — R7989 Other specified abnormal findings of blood chemistry: Secondary | ICD-10-CM

## 2017-11-07 DIAGNOSIS — D649 Anemia, unspecified: Secondary | ICD-10-CM | POA: Diagnosis not present

## 2017-11-07 DIAGNOSIS — F4323 Adjustment disorder with mixed anxiety and depressed mood: Secondary | ICD-10-CM

## 2017-11-07 DIAGNOSIS — C210 Malignant neoplasm of anus, unspecified: Secondary | ICD-10-CM | POA: Diagnosis not present

## 2017-11-07 DIAGNOSIS — Z1159 Encounter for screening for other viral diseases: Secondary | ICD-10-CM

## 2017-11-07 DIAGNOSIS — E049 Nontoxic goiter, unspecified: Secondary | ICD-10-CM

## 2017-11-07 DIAGNOSIS — Z79899 Other long term (current) drug therapy: Secondary | ICD-10-CM | POA: Diagnosis not present

## 2017-11-07 DIAGNOSIS — L609 Nail disorder, unspecified: Secondary | ICD-10-CM

## 2017-11-07 DIAGNOSIS — Z0000 Encounter for general adult medical examination without abnormal findings: Secondary | ICD-10-CM | POA: Diagnosis not present

## 2017-11-07 DIAGNOSIS — R945 Abnormal results of liver function studies: Secondary | ICD-10-CM | POA: Diagnosis not present

## 2017-11-07 DIAGNOSIS — I8393 Asymptomatic varicose veins of bilateral lower extremities: Secondary | ICD-10-CM | POA: Diagnosis not present

## 2017-11-07 LAB — CBC WITH DIFFERENTIAL/PLATELET
BASOS PCT: 0.9 % (ref 0.0–3.0)
Basophils Absolute: 0 10*3/uL (ref 0.0–0.1)
EOS ABS: 0.2 10*3/uL (ref 0.0–0.7)
Eosinophils Relative: 4.6 % (ref 0.0–5.0)
HEMATOCRIT: 38.8 % (ref 36.0–46.0)
Hemoglobin: 12.7 g/dL (ref 12.0–15.0)
LYMPHS ABS: 0.9 10*3/uL (ref 0.7–4.0)
Lymphocytes Relative: 19.4 % (ref 12.0–46.0)
MCHC: 32.7 g/dL (ref 30.0–36.0)
MCV: 85.3 fl (ref 78.0–100.0)
MONO ABS: 0.4 10*3/uL (ref 0.1–1.0)
Monocytes Relative: 9.3 % (ref 3.0–12.0)
NEUTROS ABS: 3.1 10*3/uL (ref 1.4–7.7)
Neutrophils Relative %: 65.8 % (ref 43.0–77.0)
PLATELETS: 244 10*3/uL (ref 150.0–400.0)
RBC: 4.54 Mil/uL (ref 3.87–5.11)
RDW: 15.2 % (ref 11.5–15.5)
WBC: 4.7 10*3/uL (ref 4.0–10.5)

## 2017-11-07 LAB — BASIC METABOLIC PANEL
BUN: 17 mg/dL (ref 6–23)
CHLORIDE: 103 meq/L (ref 96–112)
CO2: 30 meq/L (ref 19–32)
CREATININE: 0.69 mg/dL (ref 0.40–1.20)
Calcium: 9.3 mg/dL (ref 8.4–10.5)
GFR: 89.87 mL/min (ref >60.00)
GLUCOSE: 77 mg/dL (ref 70–99)
Potassium: 4.1 mEq/L (ref 3.5–5.1)
Sodium: 138 mEq/L (ref 135–145)

## 2017-11-07 LAB — LIPID PANEL
CHOL/HDL RATIO: 2
Cholesterol: 182 mg/dL (ref 0–200)
HDL: 79.9 mg/dL (ref >39.00)
LDL Cholesterol: 85 mg/dL (ref 0–99)
NonHDL: 102.37
Triglycerides: 87 mg/dL (ref 0.0–149.0)
VLDL: 17.4 mg/dL (ref 0.0–40.0)

## 2017-11-07 LAB — HEPATIC FUNCTION PANEL
ALT: 31 U/L (ref 0–35)
AST: 39 U/L — ABNORMAL HIGH (ref 0–37)
Albumin: 3.7 g/dL (ref 3.5–5.2)
Alkaline Phosphatase: 78 U/L (ref 39–117)
BILIRUBIN DIRECT: 0.1 mg/dL (ref 0.0–0.3)
BILIRUBIN TOTAL: 0.4 mg/dL (ref 0.2–1.2)
TOTAL PROTEIN: 6.9 g/dL (ref 6.0–8.3)

## 2017-11-07 LAB — TSH: TSH: 6.11 u[IU]/mL — ABNORMAL HIGH (ref 0.35–4.50)

## 2017-11-07 NOTE — Progress Notes (Signed)
Chief Complaint  Patient presents with  . Annual Exam    skin spot on right shoulder, toe fungus    HPI: Ashley Moore 67 y.o. comes in today for Preventive Medicare exam/ wellness visit .Since last visit. Has ben doing much better    Mood  Ok cat died  Last pm.  Coping  Ok . Gained weight .  Eating   More carbs   Rash got dark like with chemo and then got better  Vv no pain or ulcer  Toe nail  great  After pedicure thick and yellow and other problems no pain   Mole right shoulder check slighlty scaly no blood   Health Maintenance  Topic Date Due  . MAMMOGRAM  10/05/2017  . DEXA SCAN  01/16/2018 (Originally 08/22/2014)  . Hepatitis C Screening  01/16/2018 (Originally [birth year])  . COLONOSCOPY  11/05/2020  . TETANUS/TDAP  02/14/2023  . INFLUENZA VACCINE  Completed  . PNA vac Low Risk Adult  Completed   Health Maintenance Review LIFESTYLE:  Exercise:   pitifal since x mas  Once a week.  Getting back  Into it .  Tobacco/ETS: no Alcohol:  None now  Sugar beverages: no reg   Sleep: 8-9  Drug use:    Week ly or q 2 weekly   Marijuana  HH: 1 plus 4 animals    Hearing:  Ok   Vision:  No limitations at present . Last eye check UTD  Safety:  Has smoke detector and wears seat belts.  No firearms. No excess sun exposure. Sees dentist regularly.  Falls: n  Memory: Felt to be good  , no concern from her or her family.  Depression: No anhedonia unusual crying or depressive symptoms  Nutrition: Eats well balanced diet; adequate calcium and vitamin D. No swallowing chewing problems.  Injury: no major injuries in the last six months.  Other healthcare providers:  Reviewed today .  Preventive parameters: up-to-date  Reviewed   ADLS:   There are no problems or need for assistance  driving, feeding, obtaining food, dressing, toileting and bathing, managing money using phone. She is independent.    ROS:  GEN/ HEENT: No fever, significant weight changes sweats  headaches vision problems hearing changes, CV/ PULM; No chest pain shortness of breath cough, syncope,edema  change in exercise tolerance. GI /GU: No adominal pain, vomiting, change in bowel habits. No blood in the stool. No significant GU symptoms. SKIN/HEME: ,no acute skin rashes suspicious lesions or bleeding. No lymphadenopathy, nodules, masses.  NEURO/ PSYCH:  No neurologic signs such as weakness numbness. No depression anxiety. IMM/ Allergy: No unusual infections.  Allergy .   REST of 12 system review negative except as per HPI   Past Medical History:  Diagnosis Date  . Bowen's disease    excised 1992  . Headache   . Hx of varicella   . 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([place])   . Thyroid cancer ([place])     Family History  Problem Relation Age of Onset  . Lung cancer Mother        lung  . Pancreatitis Father   . Breast cancer Sister   . Breast cancer Maternal Grandmother        breast   . Breast cancer Maternal Aunt        breast  . Melanoma Sister     Social History   Socioeconomic History  . Marital status: Widowed    Spouse name: None  . Number of children:  0  . Years of education: None  . Highest education level: None  Social Needs  . Financial resource strain: None  . Food insecurity - worry: None  . Food insecurity - inability: None  . Transportation needs - medical: None  . Transportation needs - non-medical: None  Occupational History  . None  Tobacco Use  . Smoking status: Former Research scientist (life sciences)  . Smokeless tobacco: Former Systems developer    Quit date: 09/20/1995  Substance and Sexual Activity  . Alcohol use: Yes    Alcohol/week: 0.0 oz    Comment: occasional  . Drug use: Yes    Types: Marijuana  . Sexual activity: No  Other Topics Concern  . None  Social History Narrative   H H  of 1      5 pets.   She is a former smoker   Retired Engineer, mining fine arts   Husband smokes he has active heart disease and vascular disease   etoh   Red wine  1 per night.    Tea  green tea and earl gray    Moved from [place] to [place] area in 21-Oct-1983   1 pregnancy   Husband died spring  2016 cv   Sister died 03/20/2015  Bone cancer              Outpatient Encounter Medications as of 11/07/2017  Medication Sig  . conjugated estrogens (PREMARIN) vaginal cream Place 1 Applicatorful vaginally at bedtime. Once twice a week  . hydrOXYzine (ATARAX/VISTARIL) 25 MG tablet Take 1 tablet (25 mg total) by mouth 3 (three) times daily as needed. For itching  . meloxicam (MOBIC) 15 MG tablet TAKE 1 TABLET EVERY DAY  . sertraline (ZOLOFT) 50 MG tablet Take 1 tablet (50 mg total) by mouth daily.  Marland Kitchen tiZANidine (ZANAFLEX) 4 MG tablet TAKE 1 TABLET EVERY 8 HOURS AS NEEDED FOR MUSCLE SPASM(S)  . Turmeric 500 MG CAPS Take 1 capsule by mouth daily.  Marland Kitchen gabapentin (NEURONTIN) 100 MG capsule TAKE 1 CAPSULE THREE TIMES DAILY (Patient not taking: Reported on 10/24/2017)   No facility-administered encounter medications on file as of 11/07/2017.     EXAM:  BP 130/70 (BP Location: Right Arm, Patient Position: Sitting, Cuff Size: Normal)   Pulse 72   Temp 98.2 F (36.8 C) (Oral)   Ht 5' 2" (1.575 m)   Wt 181 lb (82.1 kg)   BMI 33.11 kg/m   Body mass index is 33.11 kg/m.  Physical Exam: Vital signs reviewed QBH:ALPF is a well-developed well-nourished alert cooperative   who appears stated age in no acute distress.  HEENT: normocephalic atraumatic , Eyes: PERRL EOM's full, conjunctiva clear, Nares: paten,t no deformity discharge or tenderness., Ears: no deformity EAC's clear TMs with normal landmarks. Mouth: clear OP, no lesions, edema.  Moist mucous membranes. Dentition in adequate repair. NECK: supple without masses, thyromegaly or bruits. CHEST/PULM:  Clear to auscultation and percussion breath sounds equal no wheeze , rales or rhonchi. No chest wall deformities or tenderness. CV: PMI is nondisplaced, S1 S2 no gallops, murmurs, rubs. Peripheral pulses are full without delay.No JVD .    ABDOMEN: Bowel sounds normal nontender  No guard or rebound, no hepato splenomegal no CVA tenderness.   Extremtities:  No clubbing cyanosis or edema, no acute joint swelling or redness no focal atrophy has VV no  Ulcer redness  NEURO:  Oriented x3, cranial nerves 3-12 appear to be intact, no obvious focal weakness,gait within normal limits no abnormal  reflexes or asymmetrical SKIN: No acute rashes normal turgor, color, no bruising or petechiae. Right shoulder small tan mole excoriated  3 mm  Great toe nails thickened yellow flaky 3 other nails t thickened  PSYCH: Oriented, good eye contact, no obvious depression anxiety, cognition and judgment appear normal. LN: no cervical axillary  adenopathy No noted deficits in memory, attention, and speech.    ASSESSMENT AND PLAN:  Discussed the following assessment and plan:  Visit for preventive health examination - Plan: Basic metabolic panel, CBC with Differential/Platelet, Hepatic function panel, Lipid panel, TSH, Hepatitis C antibody  Anemia, unspecified type - on last blood count  - Plan: Basic metabolic panel, CBC with Differential/Platelet, Hepatic function panel, Lipid panel, TSH, Hepatitis C antibody  Need for hepatitis C screening test - Plan: Basic metabolic panel, CBC with Differential/Platelet, Hepatic function panel, Lipid panel, TSH, Hepatitis C antibody  Abnormal LFTs - Plan: Basic metabolic panel, CBC with Differential/Platelet, Hepatic function panel, Lipid panel, TSH, Hepatitis C antibody  Anal cancer (HCC) - Plan: Basic metabolic panel, CBC with Differential/Platelet, Hepatic function panel, Lipid panel, TSH, Hepatitis C antibody  Medication management - Plan: Basic metabolic panel, CBC with Differential/Platelet, Hepatic function panel, Lipid panel, TSH, Hepatitis C antibody  Nail abnormalities - Plan: Basic metabolic panel, CBC with Differential/Platelet, Hepatic function panel, Lipid panel, TSH, Hepatitis C  antibody  Varicose veins of both lower extremities, unspecified whether complicated - Plan: Basic metabolic panel, CBC with Differential/Platelet, Hepatic function panel, Lipid panel, TSH, Hepatitis C antibody  Goiter - mng follow  tfts - Plan: Basic metabolic panel, CBC with Differential/Platelet, Hepatic function panel, Lipid panel, TSH, Hepatitis C antibody  Adjustment reaction with anxiety and depression - doing much better  Overall doing much better in regard to toenail changes advise no pedicures and consider seeing podiatrist this may be onychomycosis. Varicose veins no alarm findings follow-up if ulcers redness or pain. Follow the mole on the right shoulder. We will let her know we get labs back.  She had a mildly abnormal LFT in the past.  Patient Care Team: Panosh, Standley Brooking, MD as PCP - General Inda Castle, MD (Inactive) as Attending Physician (Gastroenterology) [name], MD as Consulting Physician (Oncology) Kyung Rudd, MD as Consulting Physician (Radiation Oncology)    Lab Results  Component Value Date   WBC 4.7 11/07/2017   HGB 12.7 11/07/2017   HCT 38.8 11/07/2017   PLT 244.0 11/07/2017   GLUCOSE 77 11/07/2017   CHOL 182 11/07/2017   TRIG 87.0 11/07/2017   HDL 79.90 11/07/2017   LDLCALC 85 11/07/2017   ALT 31 11/07/2017   AST 39 (H) 11/07/2017   NA 138 11/07/2017   K 4.1 11/07/2017   CL 103 11/07/2017   CREATININE 0.69 11/07/2017   BUN 17 11/07/2017   CO2 30 11/07/2017   TSH 6.11 (H) 11/07/2017   HGBA1C 5.5 05/11/2015       Patient Instructions   consider seeing  Podiatrist about the nails .   Will notify you  of labs when available.   Glad you are doing better    Plan fu visit depending on labs or   6-12 months   Health Maintenance, Female Adopting a healthy lifestyle and getting preventive care can go a long way to promote health and wellness. Talk with your health care provider about what schedule of regular examinations is right  for you. This is a good chance for you to check in with your provider about disease  prevention and staying healthy. In between checkups, there are plenty of things you can do on your own. Experts have done a lot of research about which lifestyle changes and preventive measures are most likely to keep you healthy. Ask your health care provider for more information. Weight and diet Eat a healthy diet  Be sure to include plenty of vegetables, fruits, low-fat dairy products, and lean protein.  Do not eat a lot of foods high in solid fats, added sugars, or salt.  Get regular exercise. This is one of the most important things you can do for your health. ? Most adults should exercise for at least 150 minutes each week. The exercise should increase your heart rate and make you sweat (moderate-intensity exercise). ? Most adults should also do strengthening exercises at least twice a week. This is in addition to the moderate-intensity exercise.  Maintain a healthy weight  Body mass index (BMI) is a measurement that can be used to identify possible weight problems. It estimates body fat based on height and weight. Your health care provider can help determine your BMI and help you achieve or maintain a healthy weight.  For females 86 years of age and older: ? A BMI below 18.5 is considered underweight. ? A BMI of 18.5 to 24.9 is normal. ? A BMI of 25 to 29.9 is considered overweight. ? A BMI of 30 and above is considered obese.  Watch levels of cholesterol and blood lipids  You should start having your blood tested for lipids and cholesterol at 69 years of age, then have this test every 5 years.  You may need to have your cholesterol levels checked more often if: ? Your lipid or cholesterol levels are high. ? You are older than 69 years of age. ? You are at high risk for heart disease.  Cancer screening Lung Cancer  Lung cancer screening is recommended for adults 83-69 years old who are at  high risk for lung cancer because of a history of smoking.  A yearly low-dose CT scan of the lungs is recommended for people who: ? Currently smoke. ? Have quit within the past 15 years. ? Have at least a 30-pack-year history of smoking. A pack year is smoking an average of one pack of cigarettes a day for 1 year.  Yearly screening should continue until it has been 15 years since you quit.  Yearly screening should stop if you develop a health problem that would prevent you from having lung cancer treatment.  Breast Cancer  Practice breast self-awareness. This means understanding how your breasts normally appear and feel.  It also means doing regular breast self-exams. Let your health care provider know about any changes, no matter how small.  If you are in your 20s or 30s, you should have a clinical breast exam (CBE) by a health care provider every 1-3 years as part of a regular health exam.  If you are 32 or older, have a CBE every year. Also consider having a breast X-ray (mammogram) every year.  If you have a family history of breast cancer, talk to your health care provider about genetic screening.  If you are at high risk for breast cancer, talk to your health care provider about having an MRI and a mammogram every year.  Breast cancer gene (BRCA) assessment is recommended for women who have family members with BRCA-related cancers. BRCA-related cancers include: ? Breast. ? Ovarian. ? Tubal. ? Peritoneal cancers.  Results of the assessment  will determine the need for genetic counseling and BRCA1 and BRCA2 testing.  Cervical Cancer Your health care provider may recommend that you be screened regularly for cancer of the pelvic organs (ovaries, uterus, and vagina). This screening involves a pelvic examination, including checking for microscopic changes to the surface of your cervix (Pap test). You may be encouraged to have this screening done every 3 years, beginning at age  60.  For women ages 58-65, health care providers may recommend pelvic exams and Pap testing every 3 years, or they may recommend the Pap and pelvic exam, combined with testing for human papilloma virus (HPV), every 5 years. Some types of HPV increase your risk of cervical cancer. Testing for HPV may also be done on women of any age with unclear Pap test results.  Other health care providers may not recommend any screening for nonpregnant women who are considered low risk for pelvic cancer and who do not have symptoms. Ask your health care provider if a screening pelvic exam is right for you.  If you have had past treatment for cervical cancer or a condition that could lead to cancer, you need Pap tests and screening for cancer for at least 20 years after your treatment. If Pap tests have been discontinued, your risk factors (such as having a new sexual partner) need to be reassessed to determine if screening should resume. Some women have medical problems that increase the chance of getting cervical cancer. In these cases, your health care provider may recommend more frequent screening and Pap tests.  Colorectal Cancer  This type of cancer can be detected and often prevented.  Routine colorectal cancer screening usually begins at 69 years of age and continues through 69 years of age.  Your health care provider may recommend screening at an earlier age if you have risk factors for colon cancer.  Your health care provider may also recommend using home test kits to check for hidden blood in the stool.  A small camera at the end of a tube can be used to examine your colon directly (sigmoidoscopy or colonoscopy). This is done to check for the earliest forms of colorectal cancer.  Routine screening usually begins at age 59.  Direct examination of the colon should be repeated every 5-10 years through 69 years of age. However, you may need to be screened more often if early forms of precancerous polyps  or small growths are found.  Skin Cancer  Check your skin from head to toe regularly.  Tell your health care provider about any new moles or changes in moles, especially if there is a change in a mole's shape or color.  Also tell your health care provider if you have a mole that is larger than the size of a pencil eraser.  Always use sunscreen. Apply sunscreen liberally and repeatedly throughout the day.  Protect yourself by wearing long sleeves, pants, a wide-brimmed hat, and sunglasses whenever you are outside.  Heart disease, diabetes, and high blood pressure  High blood pressure causes heart disease and increases the risk of stroke. High blood pressure is more likely to develop in: ? People who have blood pressure in the high end of the normal range (130-139/85-89 mm Hg). ? People who are overweight or obese. ? People who are African American.  If you are 59-48 years of age, have your blood pressure checked every 3-5 years. If you are 47 years of age or older, have your blood pressure checked every year. You  should have your blood pressure measured twice-once when you are at a hospital or clinic, and once when you are not at a hospital or clinic. Record the average of the two measurements. To check your blood pressure when you are not at a hospital or clinic, you can use: ? An automated blood pressure machine at a pharmacy. ? A home blood pressure monitor.  If you are between 37 years and 71 years old, ask your health care provider if you should take aspirin to prevent strokes.  Have regular diabetes screenings. This involves taking a blood sample to check your fasting blood sugar level. ? If you are at a normal weight and have a low risk for diabetes, have this test once every three years after 69 years of age. ? If you are overweight and have a high risk for diabetes, consider being tested at a younger age or more often. Preventing infection Hepatitis B  If you have a higher  risk for hepatitis B, you should be screened for this virus. You are considered at high risk for hepatitis B if: ? You were born in a country where hepatitis B is common. Ask your health care provider which countries are considered high risk. ? Your parents were born in a high-risk country, and you have not been immunized against hepatitis B (hepatitis B vaccine). ? You have HIV or AIDS. ? You use needles to inject street drugs. ? You live with someone who has hepatitis B. ? You have had sex with someone who has hepatitis B. ? You get hemodialysis treatment. ? You take certain medicines for conditions, including cancer, organ transplantation, and autoimmune conditions.  Hepatitis C  Blood testing is recommended for: ? Everyone born from [birth year] through 1967. ? Anyone with known risk factors for hepatitis C.  Sexually transmitted infections (STIs)  You should be screened for sexually transmitted infections (STIs) including gonorrhea and chlamydia if: ? You are sexually active and are younger than 70 years of age. ? You are older than 69 years of age and your health care provider tells you that you are at risk for this type of infection. ? Your sexual activity has changed since you were last screened and you are at an increased risk for chlamydia or gonorrhea. Ask your health care provider if you are at risk.  If you do not have HIV, but are at risk, it may be recommended that you take a prescription medicine daily to prevent HIV infection. This is called pre-exposure prophylaxis (PrEP). You are considered at risk if: ? You are sexually active and do not regularly use condoms or know the HIV status of your partner(s). ? You take drugs by injection. ? You are sexually active with a partner who has HIV.  Talk with your health care provider about whether you are at high risk of being infected with HIV. If you choose to begin PrEP, you should first be tested for HIV. You should then be tested  every 3 months for as long as you are taking PrEP. Pregnancy  If you are premenopausal and you may become pregnant, ask your health care provider about preconception counseling.  If you may become pregnant, take 400 to 800 micrograms (mcg) of folic acid every day.  If you want to prevent pregnancy, talk to your health care provider about birth control (contraception). Osteoporosis and menopause  Osteoporosis is a disease in which the bones lose minerals and strength with aging. This can result in serious  bone fractures. Your risk for osteoporosis can be identified using a bone density scan.  If you are 60 years of age or older, or if you are at risk for osteoporosis and fractures, ask your health care provider if you should be screened.  Ask your health care provider whether you should take a calcium or vitamin D supplement to lower your risk for osteoporosis.  Menopause may have certain physical symptoms and risks.  Hormone replacement therapy may reduce some of these symptoms and risks. Talk to your health care provider about whether hormone replacement therapy is right for you. Follow these instructions at home:  Schedule regular health, dental, and eye exams.  Stay current with your immunizations.  Do not use any tobacco products including cigarettes, chewing tobacco, or electronic cigarettes.  If you are pregnant, do not drink alcohol.  If you are breastfeeding, limit how much and how often you drink alcohol.  Limit alcohol intake to no more than 1 drink per day for nonpregnant women. One drink equals 12 ounces of beer, 5 ounces of wine, or 1 ounces of hard liquor.  Do not use street drugs.  Do not share needles.  Ask your health care provider for help if you need support or information about quitting drugs.  Tell your health care provider if you often feel depressed.  Tell your health care provider if you have ever been abused or do not feel safe at home. This  information is not intended to replace advice given to you by your health care provider. Make sure you discuss any questions you have with your health care provider. Document Released: 03/21/2011 Document Revised: 02/11/2016 Document Reviewed: 06/09/2015 Elsevier Interactive Patient Education  2018 [place]. Panosh M.D.

## 2017-11-07 NOTE — Patient Instructions (Addendum)
consider seeing  Podiatrist about the nails .   Will notify you  of labs when available.   Glad you are doing better    Plan fu visit depending on labs or   6-12 months   Health Maintenance, Female Adopting a healthy lifestyle and getting preventive care can go a long way to promote health and wellness. Talk with your health care provider about what schedule of regular examinations is right for you. This is a good chance for you to check in with your provider about disease prevention and staying healthy. In between checkups, there are plenty of things you can do on your own. Experts have done a lot of research about which lifestyle changes and preventive measures are most likely to keep you healthy. Ask your health care provider for more information. Weight and diet Eat a healthy diet  Be sure to include plenty of vegetables, fruits, low-fat dairy products, and lean protein.  Do not eat a lot of foods high in solid fats, added sugars, or salt.  Get regular exercise. This is one of the most important things you can do for your health. ? Most adults should exercise for at least 150 minutes each week. The exercise should increase your heart rate and make you sweat (moderate-intensity exercise). ? Most adults should also do strengthening exercises at least twice a week. This is in addition to the moderate-intensity exercise.  Maintain a healthy weight  Body mass index (BMI) is a measurement that can be used to identify possible weight problems. It estimates body fat based on height and weight. Your health care provider can help determine your BMI and help you achieve or maintain a healthy weight.  For females 17 years of age and older: ? A BMI below 18.5 is considered underweight. ? A BMI of 18.5 to 24.9 is normal. ? A BMI of 25 to 29.9 is considered overweight. ? A BMI of 30 and above is considered obese.  Watch levels of cholesterol and blood lipids  You should start having your  blood tested for lipids and cholesterol at 69 years of age, then have this test every 5 years.  You may need to have your cholesterol levels checked more often if: ? Your lipid or cholesterol levels are high. ? You are older than 69 years of age. ? You are at high risk for heart disease.  Cancer screening Lung Cancer  Lung cancer screening is recommended for adults 68-22 years old who are at high risk for lung cancer because of a history of smoking.  A yearly low-dose CT scan of the lungs is recommended for people who: ? Currently smoke. ? Have quit within the past 15 years. ? Have at least a 30-pack-year history of smoking. A pack year is smoking an average of one pack of cigarettes a day for 1 year.  Yearly screening should continue until it has been 15 years since you quit.  Yearly screening should stop if you develop a health problem that would prevent you from having lung cancer treatment.  Breast Cancer  Practice breast self-awareness. This means understanding how your breasts normally appear and feel.  It also means doing regular breast self-exams. Let your health care provider know about any changes, no matter how small.  If you are in your 20s or 30s, you should have a clinical breast exam (CBE) by a health care provider every 1-3 years as part of a regular health exam.  If you are 40 or  older, have a CBE every year. Also consider having a breast X-ray (mammogram) every year.  If you have a family history of breast cancer, talk to your health care provider about genetic screening.  If you are at high risk for breast cancer, talk to your health care provider about having an MRI and a mammogram every year.  Breast cancer gene (BRCA) assessment is recommended for women who have family members with BRCA-related cancers. BRCA-related cancers include: ? Breast. ? Ovarian. ? Tubal. ? Peritoneal cancers.  Results of the assessment will determine the need for genetic  counseling and BRCA1 and BRCA2 testing.  Cervical Cancer Your health care provider may recommend that you be screened regularly for cancer of the pelvic organs (ovaries, uterus, and vagina). This screening involves a pelvic examination, including checking for microscopic changes to the surface of your cervix (Pap test). You may be encouraged to have this screening done every 3 years, beginning at age 75.  For women ages 16-65, health care providers may recommend pelvic exams and Pap testing every 3 years, or they may recommend the Pap and pelvic exam, combined with testing for human papilloma virus (HPV), every 5 years. Some types of HPV increase your risk of cervical cancer. Testing for HPV may also be done on women of any age with unclear Pap test results.  Other health care providers may not recommend any screening for nonpregnant women who are considered low risk for pelvic cancer and who do not have symptoms. Ask your health care provider if a screening pelvic exam is right for you.  If you have had past treatment for cervical cancer or a condition that could lead to cancer, you need Pap tests and screening for cancer for at least 20 years after your treatment. If Pap tests have been discontinued, your risk factors (such as having a new sexual partner) need to be reassessed to determine if screening should resume. Some women have medical problems that increase the chance of getting cervical cancer. In these cases, your health care provider may recommend more frequent screening and Pap tests.  Colorectal Cancer  This type of cancer can be detected and often prevented.  Routine colorectal cancer screening usually begins at 69 years of age and continues through 69 years of age.  Your health care provider may recommend screening at an earlier age if you have risk factors for colon cancer.  Your health care provider may also recommend using home test kits to check for hidden blood in the  stool.  A small camera at the end of a tube can be used to examine your colon directly (sigmoidoscopy or colonoscopy). This is done to check for the earliest forms of colorectal cancer.  Routine screening usually begins at age 9.  Direct examination of the colon should be repeated every 5-10 years through 69 years of age. However, you may need to be screened more often if early forms of precancerous polyps or small growths are found.  Skin Cancer  Check your skin from head to toe regularly.  Tell your health care provider about any new moles or changes in moles, especially if there is a change in a mole's shape or color.  Also tell your health care provider if you have a mole that is larger than the size of a pencil eraser.  Always use sunscreen. Apply sunscreen liberally and repeatedly throughout the day.  Protect yourself by wearing long sleeves, pants, a wide-brimmed hat, and sunglasses whenever you are outside.  Heart disease, diabetes, and high blood pressure  High blood pressure causes heart disease and increases the risk of stroke. High blood pressure is more likely to develop in: ? People who have blood pressure in the high end of the normal range (130-139/85-89 mm Hg). ? People who are overweight or obese. ? People who are African American.  If you are 28-75 years of age, have your blood pressure checked every 3-5 years. If you are 61 years of age or older, have your blood pressure checked every year. You should have your blood pressure measured twice-once when you are at a hospital or clinic, and once when you are not at a hospital or clinic. Record the average of the two measurements. To check your blood pressure when you are not at a hospital or clinic, you can use: ? An automated blood pressure machine at a pharmacy. ? A home blood pressure monitor.  If you are between 45 years and 3 years old, ask your health care provider if you should take aspirin to prevent  strokes.  Have regular diabetes screenings. This involves taking a blood sample to check your fasting blood sugar level. ? If you are at a normal weight and have a low risk for diabetes, have this test once every three years after 69 years of age. ? If you are overweight and have a high risk for diabetes, consider being tested at a younger age or more often. Preventing infection Hepatitis B  If you have a higher risk for hepatitis B, you should be screened for this virus. You are considered at high risk for hepatitis B if: ? You were born in a country where hepatitis B is common. Ask your health care provider which countries are considered high risk. ? Your parents were born in a high-risk country, and you have not been immunized against hepatitis B (hepatitis B vaccine). ? You have HIV or AIDS. ? You use needles to inject street drugs. ? You live with someone who has hepatitis B. ? You have had sex with someone who has hepatitis B. ? You get hemodialysis treatment. ? You take certain medicines for conditions, including cancer, organ transplantation, and autoimmune conditions.  Hepatitis C  Blood testing is recommended for: ? Everyone born from [birth year] through 1967. ? Anyone with known risk factors for hepatitis C.  Sexually transmitted infections (STIs)  You should be screened for sexually transmitted infections (STIs) including gonorrhea and chlamydia if: ? You are sexually active and are younger than 69 years of age. ? You are older than 69 years of age and your health care provider tells you that you are at risk for this type of infection. ? Your sexual activity has changed since you were last screened and you are at an increased risk for chlamydia or gonorrhea. Ask your health care provider if you are at risk.  If you do not have HIV, but are at risk, it may be recommended that you take a prescription medicine daily to prevent HIV infection. This is called pre-exposure prophylaxis  (PrEP). You are considered at risk if: ? You are sexually active and do not regularly use condoms or know the HIV status of your partner(s). ? You take drugs by injection. ? You are sexually active with a partner who has HIV.  Talk with your health care provider about whether you are at high risk of being infected with HIV. If you choose to begin PrEP, you should first be tested for HIV.  You should then be tested every 3 months for as long as you are taking PrEP. Pregnancy  If you are premenopausal and you may become pregnant, ask your health care provider about preconception counseling.  If you may become pregnant, take 400 to 800 micrograms (mcg) of folic acid every day.  If you want to prevent pregnancy, talk to your health care provider about birth control (contraception). Osteoporosis and menopause  Osteoporosis is a disease in which the bones lose minerals and strength with aging. This can result in serious bone fractures. Your risk for osteoporosis can be identified using a bone density scan.  If you are 13 years of age or older, or if you are at risk for osteoporosis and fractures, ask your health care provider if you should be screened.  Ask your health care provider whether you should take a calcium or vitamin D supplement to lower your risk for osteoporosis.  Menopause may have certain physical symptoms and risks.  Hormone replacement therapy may reduce some of these symptoms and risks. Talk to your health care provider about whether hormone replacement therapy is right for you. Follow these instructions at home:  Schedule regular health, dental, and eye exams.  Stay current with your immunizations.  Do not use any tobacco products including cigarettes, chewing tobacco, or electronic cigarettes.  If you are pregnant, do not drink alcohol.  If you are breastfeeding, limit how much and how often you drink alcohol.  Limit alcohol intake to no more than 1 drink per day for  nonpregnant women. One drink equals 12 ounces of beer, 5 ounces of wine, or 1 ounces of hard liquor.  Do not use street drugs.  Do not share needles.  Ask your health care provider for help if you need support or information about quitting drugs.  Tell your health care provider if you often feel depressed.  Tell your health care provider if you have ever been abused or do not feel safe at home. This information is not intended to replace advice given to you by your health care provider. Make sure you discuss any questions you have with your health care provider. Document Released: 03/21/2011 Document Revised: 02/11/2016 Document Reviewed: 06/09/2015 Elsevier Interactive Patient Education  [employer].

## 2017-11-08 LAB — HEPATITIS C ANTIBODY
Hepatitis C Ab: NONREACTIVE
SIGNAL TO CUT-OFF: 0.02 (ref <1.00)

## 2017-11-16 ENCOUNTER — Telehealth: Payer: Self-pay | Admitting: Internal Medicine

## 2017-11-16 DIAGNOSIS — R7989 Other specified abnormal findings of blood chemistry: Secondary | ICD-10-CM

## 2017-11-16 NOTE — Telephone Encounter (Signed)
Pt returned call and lab results given to her with verbal understanding. She understands that she needs to have a lab appointment in one month. Will notify office for lab order.

## 2017-11-16 NOTE — Telephone Encounter (Signed)
Order placed Results notification documented in result note.  Nothing further needed.

## 2017-11-21 ENCOUNTER — Ambulatory Visit: Payer: Self-pay | Admitting: Neurology

## 2017-11-21 ENCOUNTER — Telehealth: Payer: Self-pay | Admitting: Oncology

## 2017-11-21 ENCOUNTER — Inpatient Hospital Stay: Payer: Medicare HMO | Attending: Nurse Practitioner | Admitting: Nurse Practitioner

## 2017-11-21 ENCOUNTER — Encounter: Payer: Self-pay | Admitting: Nurse Practitioner

## 2017-11-21 VITALS — BP 142/87 | HR 84 | Temp 97.9°F | Resp 18 | Ht 62.0 in | Wt 178.4 lb

## 2017-11-21 DIAGNOSIS — G893 Neoplasm related pain (acute) (chronic): Secondary | ICD-10-CM | POA: Diagnosis not present

## 2017-11-21 DIAGNOSIS — Z803 Family history of malignant neoplasm of breast: Secondary | ICD-10-CM | POA: Diagnosis not present

## 2017-11-21 DIAGNOSIS — C210 Malignant neoplasm of anus, unspecified: Secondary | ICD-10-CM

## 2017-11-21 NOTE — Progress Notes (Signed)
  [place] OFFICE PROGRESS NOTE   Diagnosis: Anal cancer  INTERVAL HISTORY:   Ashley Moore returns as scheduled.  She overall feels well.  No problem with bowel movements in general.  She intermittently notes a small amount of rectal bleeding.  The bleeding is not always associated with a bowel movement.  This has improved over time.  She reports a good appetite.  She has become more active.  Objective:  Vital signs in last 24 hours:  Blood pressure (!) 142/87, pulse 84, temperature 97.9 F (36.6 C), temperature source Oral, resp. rate 18, height 5\' 2"  (1.575 m), weight 178 lb 6.4 oz (80.9 kg), SpO2 100 %.    HEENT: Neck without mass. Lymphatics: No palpable cervical, supraclavicular, axillary or inguinal lymph nodes. Resp: Lungs clear bilaterally. Cardio: Regular rate and rhythm. GI: Abdomen soft and nontender.  No hepatomegaly. Vascular: No leg edema.  Skin: Radiation skin change at the groin/labia and perineum.  Digital rectal exam deferred due to recent exam in radiation oncology and upcoming appointment for annual surveillance next week.  No palpable mass on external examination.   Lab Results:  Lab Results  Component Value Date   WBC 4.7 11/07/2017   HGB 12.7 11/07/2017   HCT 38.8 11/07/2017   MCV 85.3 11/07/2017   PLT 244.0 11/07/2017   NEUTROABS 3.1 11/07/2017    Imaging:  No results found.  Medications: I have reviewed the patient's current medications.  Assessment/Plan: 1. Anal cancer ? CT abdomen/pelvis 10/21/2016-thickening of the anus extending to the junction between the anus and rectum with a large stool ball in the rectum and mild fat stranding posterior to the rectum. Enlarged lymph nodes in the right and left inguinal regions. A few mildly prominent nodesseen posterior to the rectum. ? 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 ? PET scan [id number] hypermetabolic anal mass with hypermetabolic  metastases to the groin region bilaterally, left pelvic sidewall and presacral space. ? Initiation of radiation and cycle 1 5-FU/mitomycin C 11/14/2016 ? Cycle 2 5-FU/mitomycin C 12/12/2016 (5-FU dose reduced due to mucositis, diarrhea, skin breakdown) ? Radiation completed 12/23/2016 ? CT abdomen/pelvis 04/14/2017-resolution of anal mass and bilateral inguinal lymphadenopathy. No residual tumor seen. 2. Left labial lesions. Question direct extension from the anal cancer versus metastatic disease from anal cancer versus a separate malignant process. 3. History of pain and bleeding secondary to #1 and skin breakdown 4. 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 5. Multiple family members with breast cancer. 6. History of hypokalemia-likely secondary to decreased nutritional intake and diarrhea; potassium in normal range 01/16/2017. No longer taking a potassium supplement.   Disposition: Ashley Moore remains in clinical remission from anal cancer.  She is scheduled to see Dr. Quentin Cornwall next week for anal surveillance.  She will return for a follow-up visit here in 6 months.  She will contact the office in the interim with any problems.  Plan reviewed with Dr. Benay Spice.    Ned Card ANP/GNP-BC   11/21/2017  2:58 PM

## 2017-11-21 NOTE — Telephone Encounter (Signed)
Appointment scheduled AVS/Calendar printed per 3-5 los

## 2017-11-27 ENCOUNTER — Other Ambulatory Visit: Payer: Self-pay | Admitting: Neurology

## 2017-11-30 DIAGNOSIS — C210 Malignant neoplasm of anus, unspecified: Secondary | ICD-10-CM | POA: Diagnosis not present

## 2018-01-16 DIAGNOSIS — Q525 Fusion of labia: Secondary | ICD-10-CM | POA: Diagnosis not present

## 2018-01-29 ENCOUNTER — Other Ambulatory Visit: Payer: Self-pay | Admitting: Neurology

## 2018-02-20 ENCOUNTER — Other Ambulatory Visit: Payer: Self-pay | Admitting: Internal Medicine

## 2018-02-28 DIAGNOSIS — C210 Malignant neoplasm of anus, unspecified: Secondary | ICD-10-CM | POA: Diagnosis not present

## 2018-03-20 ENCOUNTER — Other Ambulatory Visit: Payer: Self-pay | Admitting: Neurology

## 2018-05-30 DIAGNOSIS — C210 Malignant neoplasm of anus, unspecified: Secondary | ICD-10-CM | POA: Diagnosis not present

## 2018-06-18 ENCOUNTER — Telehealth: Payer: Self-pay | Admitting: Oncology

## 2018-06-18 NOTE — Telephone Encounter (Signed)
Patient called to reschedule  °

## 2018-06-19 ENCOUNTER — Inpatient Hospital Stay: Payer: Medicare HMO | Admitting: Oncology

## 2018-06-28 ENCOUNTER — Inpatient Hospital Stay: Payer: Medicare HMO | Attending: Oncology | Admitting: Oncology

## 2018-06-28 ENCOUNTER — Telehealth: Payer: Self-pay

## 2018-06-28 VITALS — BP 122/78 | HR 88 | Temp 97.8°F | Resp 19 | Ht 62.0 in | Wt 186.5 lb

## 2018-06-28 DIAGNOSIS — Z23 Encounter for immunization: Secondary | ICD-10-CM

## 2018-06-28 DIAGNOSIS — Z803 Family history of malignant neoplasm of breast: Secondary | ICD-10-CM

## 2018-06-28 DIAGNOSIS — C210 Malignant neoplasm of anus, unspecified: Secondary | ICD-10-CM

## 2018-06-28 DIAGNOSIS — Z923 Personal history of irradiation: Secondary | ICD-10-CM

## 2018-06-28 MED ORDER — INFLUENZA VAC SPLIT QUAD 0.5 ML IM SUSY
0.5000 mL | PREFILLED_SYRINGE | Freq: Once | INTRAMUSCULAR | Status: AC
  Administered 2018-06-28: 0.5 mL via INTRAMUSCULAR

## 2018-06-28 MED ORDER — INFLUENZA VAC SPLIT QUAD 0.5 ML IM SUSY
PREFILLED_SYRINGE | INTRAMUSCULAR | Status: AC
  Filled 2018-06-28: qty 0.5

## 2018-06-28 NOTE — Progress Notes (Signed)
  [place] OFFICE PROGRESS NOTE   Diagnosis: Anal cancer  INTERVAL HISTORY:   Ashley Moore returns as scheduled.  She underwent an anal surveillance by Dr. Quentin Cornwall on 05/30/2018.  This included an anoscopy and digital exam.  There was no evidence of disease.  She is scheduled for another surveillance exam in 3 months.  She feels well.  She has occasional bleeding with bowel movements.  No consistent bleeding or auscultation.  No palpable change.  Objective:  Vital signs in last 24 hours:  There were no vitals taken for this visit.    HEENT: Neck without mass Lymphatics: No cervical, supraclavicular, axillary, or inguinal nodes. Resp: Lungs clear bilaterally Cardio: Regular rate and rhythm GI: No hepatosplenomegaly, no mass, nontender Vascular: No leg edema Rectal: Radiation pigment changes at the perineum, scar at the superior anal verge, no evidence for recurrent tumor at the anal margin   Medications: I have reviewed the patient's current medications.   Assessment/Plan: 1. Anal cancer ? CT abdomen/pelvis 10/21/2016-thickening of the anus extending to the junction between the anus and rectum with a large stool ball in the rectum and mild fat stranding posterior to the rectum. Enlarged lymph nodes in the right and left inguinal regions. A few mildly prominent nodesseen posterior to the rectum. ? 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 ? PET scan [id number] hypermetabolic anal mass with hypermetabolic metastases to the groin region bilaterally, left pelvic sidewall and presacral space. ? Initiation of radiation and cycle 1 5-FU/mitomycin C 11/14/2016 ? Cycle 2 5-FU/mitomycin C 12/12/2016 (5-FU dose reduced due to mucositis, diarrhea, skin breakdown) ? Radiation completed 12/23/2016 ? CT abdomen/pelvis 04/14/2017-resolution of anal mass and bilateral inguinal lymphadenopathy. No residual tumor seen. 2. Left labial lesions.  Question direct extension from the anal cancer versus metastatic disease from anal cancer versus a separate malignant process. 3. History of pain and bleeding secondary to #1 and skin breakdown 4. 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 5. Multiple family members with breast cancer. 6. History of hypokalemia-likely secondary to decreased nutritional intake and diarrhea; potassium in normal range 01/16/2017. No longer taking a potassium supplement.     Disposition: Ashley Moore is in clinical remission from anal cancer.  She will continue surveillance exams with Dr. Quentin Cornwall.  She will be scheduled for an examination by radiation oncology in February.  She will return for an office visit in 8 months.  Ashley Moore received an influenza vaccine today.  15 minutes were spent with the patient today.  The majority of the time was used for counseling and coordination of care.  Ashley Coder, MD  06/28/2018  8:10 AM

## 2018-06-28 NOTE — Telephone Encounter (Signed)
Patient declined calender and avs. Per 10/10 los

## 2018-07-05 ENCOUNTER — Other Ambulatory Visit: Payer: Self-pay | Admitting: Neurology

## 2018-07-05 ENCOUNTER — Telehealth: Payer: Self-pay | Admitting: *Deleted

## 2018-07-05 DIAGNOSIS — E2839 Other primary ovarian failure: Secondary | ICD-10-CM

## 2018-07-05 NOTE — Telephone Encounter (Signed)
Okay to schedule Bone Density?

## 2018-07-05 NOTE — Telephone Encounter (Signed)
Copied from [place] [id number]. Topic: Referral - Request for Referral >> Jul 05, 2018  1:59 PM Yvette Rack wrote: Has patient seen PCP for this complaint? No.  She discussed this with provider at her CPE in Feb 2019 *If NO, is insurance requiring patient see PCP for this issue before PCP can refer them? Referral for which specialty:  Preferred provider/office:  Reason for referral: Bone density

## 2018-07-05 NOTE — Telephone Encounter (Signed)
Yes please schedule dexa  Dx estrogen deficiency

## 2018-08-01 NOTE — Telephone Encounter (Signed)
Pt has been scheduled for her bone density on 08/06/18 @4 :00 pm and she also wanted to mention that she needs a mammogram due to her finding out another one of her sisters has breast cancer and she feels that she is at high risk in contracting breast cancer.

## 2018-08-02 DIAGNOSIS — Q525 Fusion of labia: Secondary | ICD-10-CM | POA: Diagnosis not present

## 2018-08-02 DIAGNOSIS — C210 Malignant neoplasm of anus, unspecified: Secondary | ICD-10-CM | POA: Diagnosis not present

## 2018-08-06 ENCOUNTER — Ambulatory Visit (INDEPENDENT_AMBULATORY_CARE_PROVIDER_SITE_OTHER)
Admission: RE | Admit: 2018-08-06 | Discharge: 2018-08-06 | Disposition: A | Discharge status: Home / Self Care | Payer: Medicare HMO | Source: Ambulatory Visit | Attending: Internal Medicine | Admitting: Internal Medicine

## 2018-08-06 DIAGNOSIS — E2839 Other primary ovarian failure: Secondary | ICD-10-CM | POA: Diagnosis not present

## 2018-08-06 NOTE — Telephone Encounter (Signed)
Previous orders expired, new order placed for BD Nothing further needed.

## 2018-08-06 NOTE — Addendum Note (Signed)
Addended by: Virl Cagey on: 08/06/2018 09:04 AM   Modules accepted: Orders

## 2018-08-23 ENCOUNTER — Other Ambulatory Visit: Payer: Self-pay | Admitting: Internal Medicine

## 2018-08-23 ENCOUNTER — Telehealth: Payer: Self-pay

## 2018-08-23 DIAGNOSIS — Z803 Family history of malignant neoplasm of breast: Secondary | ICD-10-CM

## 2018-08-23 DIAGNOSIS — Z1231 Encounter for screening mammogram for malignant neoplasm of breast: Secondary | ICD-10-CM

## 2018-08-23 DIAGNOSIS — Z85048 Personal history of other malignant neoplasm of rectum, rectosigmoid junction, and anus: Secondary | ICD-10-CM

## 2018-08-23 NOTE — Telephone Encounter (Signed)
Copied from [place] [id number]. Topic: Referral - Request for Referral >> Aug 23, 2018 10:25 AM Judyann Munson wrote: Has patient seen PCP for this complaint? Yes  Referral for which specialty:  Genetic test for breast cancer  Preferred provider/office: unsure where to go.  Reason for referral: History of Breast  cancer in her family

## 2018-08-23 NOTE — Telephone Encounter (Signed)
Please advise Dr Panosh, thanks.   

## 2018-08-24 NOTE — Telephone Encounter (Signed)
Please refer for genetic counsleing ( via cancer center) Hx of rectal cancer  fam hx of breast cancer

## 2018-08-30 NOTE — Telephone Encounter (Signed)
Referral placed.  Nothing further needed.  

## 2018-09-11 ENCOUNTER — Telehealth: Payer: Self-pay | Admitting: Oncology

## 2018-09-11 NOTE — Telephone Encounter (Signed)
Left message - called patient per 12/23 sch message for genetics appt - unable to reach patient . Left message for patient to call back to set up appt.

## 2018-09-26 DIAGNOSIS — C210 Malignant neoplasm of anus, unspecified: Secondary | ICD-10-CM | POA: Diagnosis not present

## 2018-10-08 NOTE — Progress Notes (Signed)
NEUROLOGY FOLLOW UP OFFICE NOTE  BIRD TAILOR [id number]  HISTORY OF PRESENT ILLNESS: Ashley Moore is a 68 year old right-handed woman with degenerative arthritis of the cervical spine who follows up for new concern.  UPDATE: Off gabapentin because headaches have resolved.    When she looks down, such as while typing or looking at her phone, her fingers (usually middle 3 fingers) turn while and tingling.  No radicular pain down arms or weakness of arms or hands.  It resolves as soon as she looks up.  She does lift heavy weights.  Previously the right side of her neck was sensitive but has since resolved.  This started about 6 months ago.    Current NSAIDS: Meloxicam 15 mg daily Current muscle relaxants: Tizanidine 2 mg in afternoon and 4 mg at bedtime Current Vitamins/Herbal/Supplements:  turmeric  HISTORY: On 10/28/14, her husband fell. When she tried to get him up, she developed this severe pain in the back of her head. She didn't notice pulling her neck. She has had a constant occipital headache since then. It is located on the left side. When she moves her neck or head slightly in any direction, she feels a burning shooting pain up to the front of the head and down the left side of her neck to the shoulder. She denies any numbness or tingling in the back of the head or in the extremities. She denies visual symptoms, nausea, photophobia, phonophobia or dizziness. Besides head movement, stress and change in weather also exacerbates it. Applying pressure to the area seems to help.   Treatment:  Tylenol, heating pad, ice, massage (only briefly helps for 10 minutes), OMM (helpful)  MRI of the brain with and without contrast from 06/15/15 showed no acute intracranial process, but did reveal degenerative changes at left C1-2.  MRI of cervical spine from 08/07/15 revealed showed moderate bilateral neural foraminal stenosis at C5, C6 and C7 nerve levels.  There was also  degenerative left C1-C2 articulation with acute marrow edema and involving the central C2 odontoid.  Follow up CT of cervical spine verified extensive C1-2 degenerative changes.  PAST MEDICAL HISTORY: Past Medical History:  Diagnosis Date  . Bowen's disease    excised 1992  . Headache   . Hx of varicella   . 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([place])   . Thyroid cancer [facility])     MEDICATIONS: Current Outpatient Medications on File Prior to Visit  Medication Sig Dispense Refill  . conjugated estrogens (PREMARIN) vaginal cream Place 1 Applicatorful vaginally at bedtime. Once twice a week    . estradiol (ESTRACE) 0.1 MG/GM vaginal cream Place 1 Applicatorful vaginally at bedtime.    . gabapentin (NEURONTIN) 100 MG capsule TAKE 1 CAPSULE THREE TIMES DAILY (Patient not taking: Reported on 10/24/2017) 270 capsule 3  . meloxicam (MOBIC) 15 MG tablet TAKE 1 TABLET EVERY DAY 90 tablet 0  . sertraline (ZOLOFT) 50 MG tablet TAKE 1 TABLET EVERY DAY 90 tablet 1  . tiZANidine (ZANAFLEX) 4 MG tablet TAKE 1 TABLET EVERY 8 HOURS AS NEEDED FOR MUSCLE SPASM(S) 90 tablet 5  . Turmeric 500 MG CAPS Take 1 capsule by mouth daily.     No current facility-administered medications on file prior to visit.     ALLERGIES: Allergies  Allergen Reactions  . Dilaudid [Hydromorphone Hcl]     Throws up  Cannot tolerate, IV or IM   . Benadryl [Diphenhydramine] Other (See Comments)    Tingle all over   . Melatonin  Tingle all over   . Penicillins Rash    FAMILY HISTORY: Family History  Problem Relation Age of Onset  . Lung cancer Mother        lung  . Pancreatitis Father   . Breast cancer Sister   . Breast cancer Maternal Grandmother        breast   . Breast cancer Maternal Aunt        breast  . Melanoma Sister    SOCIAL HISTORY: Social History   Socioeconomic History  . Marital status: Widowed    Spouse name: Not on file  . Number of children: 0  . Years of education: Not on file  . Highest education level: Not on  file  Occupational History  . Not on file  Social Needs  . Financial resource strain: Not on file  . Food insecurity:    Worry: Not on file    Inability: Not on file  . Transportation needs:    Medical: Not on file    Non-medical: Not on file  Tobacco Use  . Smoking status: Former Research scientist (life sciences)  . Smokeless tobacco: Former Systems developer    Quit date: 09/20/1995  Substance and Sexual Activity  . Alcohol use: Yes    Alcohol/week: 0.0 standard drinks    Comment: occasional  . Drug use: Yes    Types: Marijuana  . Sexual activity: Never  Lifestyle  . Physical activity:    Days per week: Not on file    Minutes per session: Not on file  . Stress: Not on file  Relationships  . Social connections:    Talks on phone: Not on file    Gets together: Not on file    Attends religious service: Not on file    Active member of club or organization: Not on file    Attends meetings of clubs or organizations: Not on file    Relationship status: Not on file  . Intimate partner violence:    Fear of current or ex partner: Not on file    Emotionally abused: Not on file    Physically abused: Not on file    Forced sexual activity: Not on file  Other Topics Concern  . Not on file  Social History Narrative   H H  of 1      5 pets.   She is a former smoker   Retired Engineer, mining fine arts   Husband smokes he has active heart disease and vascular disease   etoh   Red wine  1 per night.    Tea green tea and earl gray    [place]oved from [place] to [place] area in 12-Jan[place]1984   1 pregnancy   Husband died spring  2016 cv   Sister died 2015/04/14  Bone cancer              REVIEW OF SYSTEMS: Constitutional: No fevers, chills, or sweats, no generalized fatigue, change in appetite Eyes: No visual changes, double vision, eye pain Ear, nose and throat: No hearing loss, ear pain, nasal congestion, sore throat Cardiovascular: No chest pain, palpitations Respiratory:  No shortness of breath at rest or with  exertion, wheezes GastrointestinaI: No nausea, vomiting, diarrhea, abdominal pain, fecal incontinence Genitourinary:  No dysuria, urinary retention or frequency Musculoskeletal:  No neck pain, back pain Integumentary: No rash, pruritus, skin lesions Neurological: as above Psychiatric: No depression, insomnia, anxiety Endocrine: No palpitations, fatigue, diaphoresis, mood swings, change in appetite, change in weight, increased thirst  Hematologic/Lymphatic:  No purpura, petechiae. Allergic/Immunologic: no itchy/runny eyes, nasal congestion, recent allergic reactions, rashes  PHYSICAL EXAM: Blood pressure 98/76, pulse 78, height 5\' 2"  (1.575 m), weight 189 lb (85.7 kg), SpO2 99 %. General: No acute distress.  Patient appears well-groomed.  Head:  Normocephalic/atraumatic Eyes:  Fundi examined but not visualized Neck: supple, no paraspinal tenderness, full range of motion Heart:  Regular rate and rhythm Lungs:  Clear to auscultation bilaterally Back: No paraspinal tenderness Neurological Exam: alert and oriented to person, place, and time. Attention span and concentration intact, recent and remote memory intact, fund of knowledge intact.  Speech fluent and not dysarthric, language intact.  CN II-XII intact. Bulk and tone normal, muscle strength 5/5 throughout.  Sensation to pinprick reduced in 2nd to 4th digits of both hands.  Vibration sensation intact.  Deep tendon reflexes 2+ throughout, toes downgoing.  Finger to nose testing intact.  Gait normal, Romberg negative.  IMPRESSION: 1.  Bilateral numbness of fingers aggravated by neck movement.  Given known degenerative cervical disc disease, must evaluate for any bilateral spinal or foraminal stenosis.   2.  Associated Raynaud's phenomenon.   PLAN: 1.  Check MRI of cervical spine 2.  Further recommendations pending results.  15 minutes spent face to face with patient, over 50% spent discussing management.  Ashley Clines, DO  CC: Shanon Ace, MD

## 2018-10-09 ENCOUNTER — Ambulatory Visit (INDEPENDENT_AMBULATORY_CARE_PROVIDER_SITE_OTHER): Payer: Medicare HMO | Admitting: Neurology

## 2018-10-09 ENCOUNTER — Encounter: Payer: Self-pay | Admitting: Neurology

## 2018-10-09 VITALS — BP 98/76 | HR 78 | Ht 62.0 in | Wt 189.0 lb

## 2018-10-09 DIAGNOSIS — M5030 Other cervical disc degeneration, unspecified cervical region: Secondary | ICD-10-CM

## 2018-10-09 DIAGNOSIS — I7300 Raynaud's syndrome without gangrene: Secondary | ICD-10-CM | POA: Diagnosis not present

## 2018-10-09 DIAGNOSIS — R200 Anesthesia of skin: Secondary | ICD-10-CM | POA: Diagnosis not present

## 2018-10-09 NOTE — Patient Instructions (Addendum)
I would like to first check MRI of the cervical spine to look for any evidence of pinched nerves causing the numbness in the fingers.  The whitening of the fingers may be Raynaud's phenomenon, which may be related to rheumatologic conditions but often a specific diagnosis is never found.  We have sent a referral to [facility] for your MRI and they will call you directly to schedule your appt. They are located at [place]. If you need to contact them directly please call [phone].

## 2018-10-10 ENCOUNTER — Ambulatory Visit
Admission: RE | Admit: 2018-10-10 | Discharge: 2018-10-10 | Disposition: A | Discharge status: Home / Self Care | Payer: Medicare HMO | Source: Ambulatory Visit | Attending: Internal Medicine | Admitting: Internal Medicine

## 2018-10-10 DIAGNOSIS — Z1231 Encounter for screening mammogram for malignant neoplasm of breast: Secondary | ICD-10-CM | POA: Diagnosis not present

## 2018-10-20 ENCOUNTER — Ambulatory Visit
Admission: RE | Admit: 2018-10-20 | Discharge: 2018-10-20 | Disposition: A | Discharge status: Home / Self Care | Payer: Medicare HMO | Source: Ambulatory Visit | Attending: Neurology | Admitting: Neurology

## 2018-10-20 DIAGNOSIS — R200 Anesthesia of skin: Secondary | ICD-10-CM

## 2018-10-20 DIAGNOSIS — M4802 Spinal stenosis, cervical region: Secondary | ICD-10-CM | POA: Diagnosis not present

## 2018-10-20 MED ORDER — GADOBENATE DIMEGLUMINE 529 MG/ML IV SOLN
17.0000 mL | Freq: Once | INTRAVENOUS | Status: AC | PRN
  Administered 2018-10-20: 17 mL via INTRAVENOUS

## 2018-10-25 ENCOUNTER — Telehealth: Payer: Self-pay

## 2018-10-25 DIAGNOSIS — R200 Anesthesia of skin: Secondary | ICD-10-CM

## 2018-10-25 NOTE — Telephone Encounter (Signed)
Called and advised Pt of MRI results and EMG recommendation. I advised her our scheduling department will contact her today.

## 2018-10-25 NOTE — Telephone Encounter (Signed)
-----   Message from Pieter Partridge, DO sent at 10/22/2018  2:55 PM EST ----- MRI shows some possible pinched nerves.  I would like to order NCV-EMG of upper extremities to evaluate further.

## 2018-11-01 ENCOUNTER — Telehealth: Payer: Self-pay | Admitting: *Deleted

## 2018-11-01 NOTE — Telephone Encounter (Signed)
Received TC from patient regarding setting up a genetics appt. She had gotten a call from [facility] back in December about this. Message sent to [facility]

## 2018-11-06 ENCOUNTER — Ambulatory Visit (INDEPENDENT_AMBULATORY_CARE_PROVIDER_SITE_OTHER): Payer: Medicare HMO | Admitting: Neurology

## 2018-11-06 DIAGNOSIS — R200 Anesthesia of skin: Secondary | ICD-10-CM | POA: Diagnosis not present

## 2018-11-06 DIAGNOSIS — G5603 Carpal tunnel syndrome, bilateral upper limbs: Secondary | ICD-10-CM

## 2018-11-06 NOTE — Procedures (Signed)
[facility]  [address]  [place], [place] [address] Tel: [phone] Fax:  [phone] Test Date:  11/06/2018  Patient: Ashley Moore DOB: 1951/04/03 Physician: Narda Amber, DO  Sex: Female Height: 5\' 2"  Ref Phys: Metta Clines, DO  ID#: [id number] Temp: 35.0C Technician:    Patient Complaints: This is a 68 year old female referred for evaluation of bilateral hand numbness and tingling.  NCV & EMG Findings: Extensive electrodiagnostic testing of the right upper extremity and additional studies of the left shows:  1. Right median sensory response shows prolonged latency (3.9 ms).  Left mixed palmar sensory responses show prolonged latency.  Left median and bilateral ulnar sensory responses are within normal limits.  2. Bilateral median and ulnar motor responses are within normal limits.  3. Chronic motor axonal loss changes are seen affecting the C5-7 myotomes bilaterally, without accompanied active denervation.    Impression: 1. 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   2. Bilateral median neuropathy at or distal to the wrist, consistent with a clinical diagnosis of carpal tunnel syndrome.  Overall, these findings are mild in degree electrically.   ___________________________ Narda Amber, DO    Nerve Conduction Studies Anti Sensory Summary Table   Site NR Peak (ms) Norm Peak (ms) P-T Amp (V) Norm P-T Amp  Left Median Anti Sensory (2nd Digit)  35C  Wrist    3.4 <3.8 21.0 >10  Right Median Anti Sensory (2nd Digit)  35C  Wrist    3.9 <3.8 21.5 >10  Left Ulnar Anti Sensory (5th Digit)  35C  Wrist    2.9 <3.2 21.2 >5  Right Ulnar Anti Sensory (5th Digit)  35C  Wrist    2.5 <3.2 21.9 >5   Motor Summary Table   Site NR Onset (ms) Norm Onset (ms) O-P Amp (mV) Norm O-P Amp Site1 Site2 Delta-0 (ms) Dist (cm) Vel (m/s) Norm Vel (m/s)  Left Median Motor (Abd Poll Brev)  35C  Wrist    3.5  <4.0 5.8 >5 Elbow Wrist 5.2 28.0 54 >50  Elbow    8.7  5.5         Right Median Motor (Abd Poll Brev)  35C  Wrist    3.4 <4.0 5.7 >5 Elbow Wrist 4.9 27.0 55 >50  Elbow    8.3  5.0         Left Ulnar Motor (Abd Dig Minimi)  35C  Wrist    2.0 <3.1 8.0 >7 B Elbow Wrist 3.6 22.0 61 >50  B Elbow    5.6  8.0  A Elbow B Elbow 1.7 10.0 59 >50  A Elbow    7.3  7.9         Right Ulnar Motor (Abd Dig Minimi)  35C  Wrist    2.0 <3.1 7.9 >7 B Elbow Wrist 3.5 22.0 63 >50  B Elbow    5.5  6.8  A Elbow B Elbow 1.8 10.0 56 >50  A Elbow    7.3  6.5          Comparison Summary Table   Site NR Peak (ms) Norm Peak (ms) P-T Amp (V) Site1 Site2 Delta-P (ms) Norm Delta (ms)  Left Median/Ulnar Palm Comparison (Wrist - 8cm)  35C  Median Palm    2.2 <2.2 61.7 Median Palm Ulnar Palm 0.7   Ulnar Palm    1.5 <2.2 18.4       EMG   Side Muscle Ins Act  Fibs Psw Fasc Number Recrt Dur Dur. Amp Amp. Poly Poly. Comment  Right 1stDorInt Nml Nml Nml Nml Nml Nml Nml Nml Nml Nml Nml Nml N/A  Right Abd Poll Brev Nml Nml Nml Nml Nml Nml Nml Nml Nml Nml Nml Nml N/A  Right Ext Indicis Nml Nml Nml Nml Nml Nml Nml Nml Nml Nml Nml Nml N/A  Right PronatorTeres Nml Nml Nml Nml 1- Rapid Some 1+ Some 1+ Nml Nml N/A  Right Biceps Nml Nml Nml Nml 1- Rapid Some 1+ Some 1+ Nml Nml N/A  Right Triceps Nml Nml Nml Nml Nml Nml Nml Nml Nml Nml Nml Nml N/A  Right Deltoid Nml Nml Nml Nml 1- Rapid Some 1+ Some 1+ Nml Nml N/A  Left 1stDorInt Nml Nml Nml Nml Nml Nml Nml Nml Nml Nml Nml Nml N/A  Left Abd Poll Brev Nml Nml Nml Nml Nml Nml Nml Nml Nml Nml Nml Nml N/A  Left Ext Indicis Nml Nml Nml Nml Nml Nml Nml Nml Nml Nml Nml Nml N/A  Left PronatorTeres Nml Nml Nml Nml 1- Rapid Some 1+ Some 1+ Nml Nml N/A  Left Biceps Nml Nml Nml Nml 1- Rapid Some 1+ Some 1+ Nml Nml N/A  Left Triceps Nml Nml Nml Nml Nml Nml Nml Nml Nml Nml Nml Nml N/A  Left Deltoid Nml Nml Nml Nml 1- Rapid Some 1+ Some 1+ Nml Nml N/A      Waveforms:

## 2018-11-13 ENCOUNTER — Telehealth: Payer: Self-pay | Admitting: Oncology

## 2018-11-13 NOTE — Telephone Encounter (Signed)
Called patient per staff message from [facility] - left message for patient to call back

## 2018-11-13 NOTE — Telephone Encounter (Signed)
Called pt a second time - no answer.

## 2018-11-14 ENCOUNTER — Telehealth: Payer: Self-pay

## 2018-11-14 ENCOUNTER — Telehealth: Payer: Self-pay | Admitting: *Deleted

## 2018-11-14 NOTE — Telephone Encounter (Signed)
Returned call from pt regarding genetic counseling appt. Left message to call & speak with scheduling b/c they have been trying to reach her to schedule.  Also asked if there was an alternate ph # to reach her since the schedulers were having trouble getting through to her.

## 2018-11-14 NOTE — Telephone Encounter (Signed)
Called and spoke with Pt. She states she has started sleeping differently, supporting her upper back, not just her neck, and she is not standing bending her neck looking down at work any longer, and the symptoms have mostly resolved. She will call if if needed.

## 2018-11-14 NOTE — Telephone Encounter (Signed)
-----   Message from Pieter Partridge, DO sent at 11/09/2018  7:50 AM EST ----- Nerve study shows evidence that hand numbness could be from neck or due to carpal tunnel.  Both findings appear to be mild.  Since it is associated with neck movement, it may be from the neck as she has known arthritis in the neck.  If there is associated neck pain, we can refer her for physical therapy.  To see if it may be due to carpal tunnel, she may start wearing wrist splints as much as possible (such as at night) to see if symptoms resolve.

## 2018-11-22 ENCOUNTER — Telehealth: Payer: Self-pay | Admitting: Oncology

## 2018-11-22 NOTE — Telephone Encounter (Signed)
Scheduled appt per pt request for genetics - original sch message sent in December 2019 sch message.

## 2018-12-04 ENCOUNTER — Telehealth: Payer: Self-pay | Admitting: Licensed Clinical Social Worker

## 2018-12-04 NOTE — Telephone Encounter (Signed)
Rescheduled genetics appointment to 02/07/2019 at 2 pm.

## 2018-12-13 ENCOUNTER — Other Ambulatory Visit: Payer: Self-pay

## 2018-12-17 ENCOUNTER — Other Ambulatory Visit: Payer: Self-pay | Admitting: Neurology

## 2018-12-26 ENCOUNTER — Ambulatory Visit: Payer: Self-pay | Admitting: *Deleted

## 2018-12-26 NOTE — Telephone Encounter (Signed)
Patient calling to ask questions regarding corona virus. 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 Pt states that last time the pt was at work was on Sunday and states that everyone that works in the store has been wearing masks and keeping the areas clean. Pt reports that neither herself or other employees have any symptoms. Pt wanting to know if there was some where that the employee could go to be tested. Pt given home care advice and advised that testing was limited and if her employee developed severe symptoms she should go to the ED for possible testing. Informed pt that the current recommendation for people with symptoms of COVID would be to self isolate at home. Pt also advised to return call to office if she develops symptoms or if she has additional questions. Pt verbalized understanding.  Reason for Disposition . COVID -19, questions about  Protocols used: CORONAVIRUS (COVID-19) EXPOSURE-A-AH

## 2018-12-27 NOTE — Telephone Encounter (Signed)
Spoke with patient, pt is asymptomatic but wanted to make Dr Regis Bill aware of her possible exposure.   I advised that the employee can reach out to [facility] locations for inquire about testing.

## 2018-12-27 NOTE — Telephone Encounter (Signed)
I agree with referring her to the [facility] sites.  Marland Kitchenand follow self isolation  Guidelines no matter what.

## 2019-01-18 ENCOUNTER — Other Ambulatory Visit: Payer: Self-pay | Admitting: Neurology

## 2019-01-22 ENCOUNTER — Telehealth: Payer: Self-pay | Admitting: Neurology

## 2019-01-22 NOTE — Telephone Encounter (Signed)
Patient called regarding [employer] and her Meloxicam medication. She said [employer] would like you to fax them at [phone]. Please Call. Thanks

## 2019-01-24 ENCOUNTER — Other Ambulatory Visit: Payer: Self-pay

## 2019-01-24 ENCOUNTER — Telehealth: Payer: Self-pay | Admitting: Neurology

## 2019-01-24 MED ORDER — MELOXICAM 15 MG PO TABS: 15.0000 mg | ORAL_TABLET | Freq: Every day | ORAL | 3 refills | Status: DC

## 2019-01-24 NOTE — Telephone Encounter (Signed)
#  15 sent in

## 2019-01-24 NOTE — Telephone Encounter (Signed)
Patient called needing to see if she could get a few of her Meloxicam medication at [facility] on [address] until her Rx comes? She said she is in a lot of pain. Please Call. Thanks

## 2019-01-24 NOTE — Telephone Encounter (Signed)
Patient is calling in again about medication. Please fax this. Thanks!

## 2019-02-06 ENCOUNTER — Ambulatory Visit (INDEPENDENT_AMBULATORY_CARE_PROVIDER_SITE_OTHER): Payer: Medicare HMO | Admitting: Internal Medicine

## 2019-02-06 ENCOUNTER — Other Ambulatory Visit: Payer: Self-pay

## 2019-02-06 ENCOUNTER — Telehealth: Payer: Self-pay | Admitting: Licensed Clinical Social Worker

## 2019-02-06 ENCOUNTER — Encounter: Payer: Self-pay | Admitting: Internal Medicine

## 2019-02-06 VITALS — BP 130/80 | HR 74 | Temp 98.0°F | Wt 195.6 lb

## 2019-02-06 DIAGNOSIS — R300 Dysuria: Secondary | ICD-10-CM

## 2019-02-06 DIAGNOSIS — R3989 Other symptoms and signs involving the genitourinary system: Secondary | ICD-10-CM

## 2019-02-06 DIAGNOSIS — Z85048 Personal history of other malignant neoplasm of rectum, rectosigmoid junction, and anus: Secondary | ICD-10-CM | POA: Diagnosis not present

## 2019-02-06 LAB — POCT URINALYSIS DIPSTICK
Bilirubin, UA: NEGATIVE
Blood, UA: POSITIVE
Glucose, UA: NEGATIVE
Ketones, UA: NEGATIVE
Nitrite, UA: NEGATIVE
Protein, UA: NEGATIVE
Spec Grav, UA: <=1.005 — AB (ref 1.010–1.025)
Urobilinogen, UA: 0.2 E.U./dL
pH, UA: 6.5 (ref 5.0–8.0)

## 2019-02-06 MED ORDER — NITROFURANTOIN MONOHYD MACRO 100 MG PO CAPS: 100.0000 mg | ORAL_CAPSULE | Freq: Two times a day (BID) | ORAL | 0 refills | Status: DC

## 2019-02-06 NOTE — Telephone Encounter (Signed)
Called patient regarding upcoming [facility] appointment, test run complete and e-mail has been sent. °

## 2019-02-06 NOTE — Patient Instructions (Addendum)
UTI treatment today  Let your cancer doctor know  about the  ocass drmapy diarrhea and if you see blood   . Hope  you feel better soon .   Contact  us if not better   With the medication Will let  You know culture results when back

## 2019-02-06 NOTE — Progress Notes (Signed)
Chief Complaint  Patient presents with  . Urinary Tract Infection    dysuria, frequency, cloudy urine,     HPI: Ashley Moore 68 y.o. come in for almost 2 weeks of dysuria urgency  Lower abd ache with urination without flank pain or vomiting fever .    Feels like  UTI   Oncology  appt delayed cause of covid restrictions. 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 No fever resp sx   Past Medical History:  Diagnosis Date  . Bowen's disease    excised 1992  . Headache   . Hx of varicella   . 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([place])   . Thyroid cancer ([place])     Family History  Problem Relation Age of Onset  . Lung cancer Mother        lung  . Pancreatitis Father   . Breast cancer Sister   . Breast cancer Maternal Grandmother        breast   . Breast cancer Maternal Aunt        breast  . Melanoma Sister   . Breast cancer Sister     Social History   Socioeconomic History  . Marital status: Widowed    Spouse name: Not on file  . Number of children: 0  . Years of education: Not on file  . Highest education level: Not on file  Occupational History  . Not on file  Social Needs  . Financial resource strain: Not on file  . Food insecurity:    Worry: Not on file    Inability: Not on file  . Transportation needs:    Medical: Not on file    Non-medical: Not on file  Tobacco Use  . Smoking status: Former Research scientist (life sciences)  . Smokeless tobacco: Former Systems developer    Quit date: 09/20/1995  Substance and Sexual Activity  . Alcohol use: Yes    Alcohol/week: 0.0 standard drinks    Comment: occasional  . Drug use: Yes    Types: Marijuana  . Sexual activity: Never  Lifestyle  . Physical activity:    Days per week: Not on file    Minutes per session:  Not on file  . Stress: Not on file  Relationships  . Social connections:    Talks on phone: Not on file    Gets together: Not on file    Attends religious service: Not on file    Active member of club or organization: Not on file    Attends meetings of clubs or organizations: Not on file    Relationship status: Not on file  Other Topics Concern  . Not on file  Social History Narrative   H H  of 1      5 pets.   She is a former smoker   Retired Engineer, mining fine arts   Husband smokes he has active heart disease and vascular disease   etoh   Red wine  1 per night.    Tea green tea and earl gray    Moved from [place] to [place] area in December 27, 1983   1 pregnancy   Husband died spring  2016 cv  Sister died June 2016  Bone cancer              Outpatient Medications Prior to Visit  Medication Sig Dispense Refill  . conjugated estrogens (PREMARIN) vaginal cream Place 1 Applicatorful vaginally at bedtime. Once twice a week    . meloxicam (MOBIC) 15 MG tablet TAKE 1 TABLET EVERY DAY 90 tablet 0  . tiZANidine (ZANAFLEX) 4 MG tablet TAKE 1 TABLET EVERY 8 HOURS AS NEEDED FOR MUSCLE SPASM(S) (Patient taking differently: 6 mg. ) 90 tablet 5  . Turmeric 500 MG CAPS Take 1 capsule by mouth daily.    . meloxicam (MOBIC) 15 MG tablet Take 1 tablet (15 mg total) by mouth daily. 15 tablet 3   No facility-administered medications prior to visit.      EXAM:  BP 130/80 (BP Location: Left Arm, Patient Position: Sitting, Cuff Size: Normal)   Pulse 74   Temp 98 F (36.7 C) (Oral)   Wt 195 lb 9.6 oz (88.7 kg)   SpO2 91%   BMI 35.78 kg/m   Body mass index is 35.78 kg/m.  GENERAL: vitals reviewed and listed above, alert, oriented, appears well hydrated and in no acute distress HEENT: atraumatic, conjunctiva  clear, no obvious abnormalities on inspection of external nose and ears  Masked during visit  NECK: no obvious masses on inspection palpation  LUNGS: clear to  auscultation bilaterally, no wheezes, rales or rhonchi, good air movement CV: HRRR, no clubbing cyanosis or  peripheral edema nl cap refill  Abdomen:  Sof,t normal bowel sounds without hepatosplenomegaly, no guarding rebound or masses no CVA tenderness  Points to lower abd for area of   soreness  MS: moves all extremities without noticeable focal  abnormality PSYCH: pleasant and cooperative, no obvious depression or anxiety  BP Readings from Last 3 Encounters:  02/06/19 130/80  10/09/18 98/76  06/28/18 122/78    ASSESSMENT AND PLAN:  Discussed the following assessment and plan:  Dysuria - Plan: POCT urinalysis dipstick, Urine Culture  Suspected UTI - Plan: Urine Culture  History of rectal cancer  -Patient advised to return or notify health care team  if  new concerns arise.  Patient Instructions  UTI treatment today  Let your cancer doctor know  about the  ocass drmapy diarrhea and if you see blood   . Hope  you feel better soon .   Contact  us if not better   With the medication Will let  You know culture results when back    [place] K. Kaleeya Hancock M.D.

## 2019-02-07 ENCOUNTER — Inpatient Hospital Stay: Payer: Medicare HMO

## 2019-02-07 ENCOUNTER — Inpatient Hospital Stay: Payer: Medicare HMO | Attending: Oncology | Admitting: Licensed Clinical Social Worker

## 2019-02-07 ENCOUNTER — Encounter: Payer: Self-pay | Admitting: Licensed Clinical Social Worker

## 2019-02-07 ENCOUNTER — Ambulatory Visit: Payer: Medicare HMO | Admitting: Internal Medicine

## 2019-02-07 DIAGNOSIS — C210 Malignant neoplasm of anus, unspecified: Secondary | ICD-10-CM | POA: Diagnosis not present

## 2019-02-07 DIAGNOSIS — Z803 Family history of malignant neoplasm of breast: Secondary | ICD-10-CM | POA: Insufficient documentation

## 2019-02-07 DIAGNOSIS — Z807 Family history of other malignant neoplasms of lymphoid, hematopoietic and related tissues: Secondary | ICD-10-CM | POA: Insufficient documentation

## 2019-02-07 DIAGNOSIS — Z8041 Family history of malignant neoplasm of ovary: Secondary | ICD-10-CM | POA: Diagnosis not present

## 2019-02-07 DIAGNOSIS — Z801 Family history of malignant neoplasm of trachea, bronchus and lung: Secondary | ICD-10-CM | POA: Diagnosis not present

## 2019-02-07 NOTE — Progress Notes (Signed)
REFERRING PROVIDER: [name], MD [place], [place] [address]  PRIMARY PROVIDER:  [name], MD  PRIMARY REASON FOR VISIT:  1. 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 Family history of breast cancer   3. Family history of lung cancer   4. Family history of ovarian cancer   5. Family history of non-Hodgkin's lymphoma    I connected with Ms. Chiang on 02/07/2019 at 2:00 PM EDT by Webex and verified that I am speaking with the correct person using two identifiers.    Patient location: home Provider location: clinic   HISTORY OF PRESENT ILLNESS:   [name], a 68 y.o. female, was seen for a [place] cancer genetics consultation at the request of Dr. Regis Bill due to a personal and  family history of cancer.  [name] presents to clinic today to discuss the possibility of a hereditary predisposition to cancer, genetic testing, and to further clarify her future cancer risks, as well as potential cancer risks for family members.   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 This was treated with chemotherapy and radiation.  RISK FACTORS:  Menarche was at age 63.  First live birth at age no children.  OCP use for less than 5 years. Ovaries intact: yes.  Hysterectomy: no.  Menopausal status: postmenopausal- menopause at age 63. HRT use: 0 years. Colonoscopy: yes; normal. Mammogram within the last year: yes. Number of breast biopsies: 0. Any excessive radiation exposure in the past: She notes having many X-rays from the age of [age] due to issues with her hip/leg.   Past Medical History:  Diagnosis Date  . Bowen's disease    excised 1992  . Family history of breast cancer   . Family history of lung cancer   . Family history of non-Hodgkin's lymphoma   . Family history of ovarian cancer   . Headache   . Hx of varicella   . 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([place])   . Thyroid cancer [facility])     Past Surgical History:  Procedure Laterality Date  . BUNIONECTOMY  Right   . IR GENERIC HISTORICAL  11/14/2016   IR US GUIDE VASC ACCESS RIGHT 11/14/2016 [facility]  . IR GENERIC HISTORICAL  11/14/2016   IR FLUORO GUIDE CV LINE RIGHT 11/14/2016 [facility]  . IR GENERIC HISTORICAL  12/12/2016   IR US GUIDE VASC ACCESS RIGHT 12/12/2016 [name], MD [facility]  . IR GENERIC HISTORICAL  12/12/2016   IR FLUORO GUIDE CV LINE RIGHT 12/12/2016 [name], MD [facility]  . SKIN CANCER EXCISION     bowens disease    Social History   Socioeconomic History  . Marital status: Widowed    Spouse name: Not on file  . Number of children: 0  . Years of education: Not on file  . Highest education level: Not on file  Occupational History  . Not on file  Social Needs  . Financial resource strain: Not on file  . Food insecurity:    Worry: Not on file    Inability: Not on file  . Transportation needs:    Medical: Not on file    Non-medical: Not on file  Tobacco Use  . Smoking status: Former Research scientist (life sciences)  . Smokeless tobacco: Former Systems developer    Quit date: 09/20/1995  Substance and Sexual Activity  . Alcohol use: Yes    Alcohol/week: 0.0 standard drinks    Comment: occasional  . Drug use: Yes  Types: Marijuana  . Sexual activity: Never  Lifestyle  . Physical activity:    Days per week: Not on file    Minutes per session: Not on file  . Stress: Not on file  Relationships  . Social connections:    Talks on phone: Not on file    Gets together: Not on file    Attends religious service: Not on file    Active member of club or organization: Not on file    Attends meetings of clubs or organizations: Not on file    Relationship status: Not on file  Other Topics Concern  . Not on file  Social History Narrative   H H  of 1      5 pets.   She is a former smoker   Retired Engineer, mining fine arts   Husband smokes he has active heart disease and vascular disease   etoh   Red wine  1 per night.    Tea green tea and earl gray    Moved from [place]  to [place] area in 03-Dec-1983   1 pregnancy   Husband died spring  2016 cv   Sister died 03/05/15  Bone cancer               FAMILY HISTORY:  We obtained a detailed, 4-generation family history.  Significant diagnoses are listed below: Family History  Problem Relation Age of Onset  . Lung cancer Mother        lung  . Breast cancer Mother 71  . Ovarian cancer Mother 77  . Pancreatitis Father   . Breast cancer Sister 72  . Breast cancer Maternal Grandmother 57       breast   . Breast cancer Maternal Aunt 75       breast  . Melanoma Sister   . Breast cancer Sister 72  . Non-Hodgkin's lymphoma Paternal Grandmother    [name] does not have children. She had 4 sisters. One passed away from metastatic breast cancer that was diagnosed at age 29. Another sister was also diagnosed with breast cancer at age 28. She reports both have had genetic testing, but it was many years ago, between 50 and 79. No cancers for her other two sisters. No cancers in her nieces/nephews.   [name] mother was diagnosed with breast cancer at 38 and ovarian cancer at 74, she had a TAH-BSO. She also had lung cancer, unspecified age. She died at 73. [name] had 3 maternal aunts, one maternal uncle. One of her aunts had breast cancer at 11, died at 60. No cancers in her maternal cousins. Her maternal grandmother had breast cancer at 68, died at 49. Maternal grandfather died at 26.  [name] father died at 86 due to pancreatitis. The patient had 2 paternal uncles, both deceased, neither had cancer she is aware of. Limited history on this side. No known cancers in her paternal cousins. Her paternal grandmother had Non Hodgkins Lymphoma at died at 66, paternal grandfather died "young" of a heart attack.  [name] is aware of previous family history of genetic testing for hereditary cancer risks. Patient's maternal ancestors are of [place], [place], [place] descent, and paternal ancestors are of [place],  [place], [place] descent. There is no reported Ashkenazi Jewish ancestry. There is no known consanguinity.  GENETIC COUNSELING ASSESSMENT: Ms. Texeira is a 68 y.o. female with a family history of breast cancer which is somewhat suggestive of a hereditary cancer syndrome and  predisposition to cancer. We, therefore, discussed and recommended the following at today's visit.   DISCUSSION: We discussed that 5-10% of breast cancer is hereditary, with most cases associated with the BRCA1/BRCA2 genes.  There are other genes that can be associated with hereditary breast cancer syndromes.  These include PALB2, ATM, CHEK2.  We discussed that ovarian cancer can also go along with breast cancer in some hereditary syndromes.   We reviewed the characteristics, features and inheritance patterns of hereditary cancer syndromes. We also discussed genetic testing, including the appropriate family members to test, the process of testing, insurance coverage and turn-around-time for results. We discussed the implications of a negative, positive and/or variant of uncertain significant result. We recommended Ms. Sanford pursue genetic testing for the Common Hereditary Cancers gene panel.   The Common Hereditary Cancers Panel offered by [facility] includes sequencing and/or deletion duplication testing of the following 48 genes: APC, ATM, AXIN2, BARD1, BMPR1A, BRCA1, BRCA2, BRIP1, CDH1, CDKN2A (p14ARF), CDKN2A (p16INK4a), CKD4, CHEK2, CTNNA1, DICER1, EPCAM (Deletion/duplication testing only), GREM1 (promoter region deletion/duplication testing only), KIT, MEN1, MLH1, MSH2, MSH3, MSH6, MUTYH, NBN, NF1, NHTL1, PALB2, PDGFRA, PMS2, POLD1, POLE, PTEN, RAD50, RAD51C, RAD51D, RNF43, SDHB, SDHC, SDHD, SMAD4, SMARCA4. STK11, TP53, TSC1, TSC2, and VHL.  The following genes were evaluated for sequence changes only: SDHA and HOXB13 c.251G>A variant only.  Based on Ms. Lamartina family history of cancer, she meets medical criteria for genetic  testing. Despite that she meets criteria, she may still have an out of pocket cost. The lab will notify her of an OOP if any.  Based on the patient's family history, a statistical model Air cabin crew) was used to estimate her risk of developing breast cancer. This estimates her lifetime risk of developing breast cancer to be approximately 12%. This estimation is in the setting of negative genetic testing and may change depending on her results. The patient's lifetime breast cancer risk is a preliminary estimate based on available information using one of several models endorsed by the [place] (ACS). The ACS recommends consideration of breast MRI screening as an adjunct to mammography for patients at high risk (defined as 20% or greater lifetime risk).     PLAN: After considering the risks, benefits, and limitations, Ms. Kleist provided informed consent to pursue genetic testing. A saliva kit was mailed to her home, and she will send her sample to [facility] for analysis of the Common Hereditary Cancers Panel. Results should be available within approximately 2-3 weeks' time, at which point they will be disclosed by telephone to Ms. Birkhead, as will any additional recommendations warranted by these results. Ms. Malveaux will receive a summary of her genetic counseling visit and a copy of her results once available. This information will also be available in Epic.   Based on Ms. Boch family history, we recommended her maternal relatives and sisters have genetic counseling and testing. Ms. Lewan will let us know if we can be of any assistance in coordinating genetic counseling and/or testing for this family member.   Lastly, we encouraged Ms. Madarang to remain in contact with cancer genetics annually so that we can continuously update the family history and inform her of any changes in cancer genetics and testing that may be of benefit for this family.   Ms. Wissinger  questions were answered to her satisfaction today. Our contact information was provided should additional questions or concerns arise. Thank you for the referral and allowing [place] to share in the care of your  patient.   Faith Rogue, MS Genetic Counselor [email or web address] Phone: [phone]  The patient was seen for a total of 30 minutes in virtual genetic counseling.  Drs. Magrinat, Lindi Adie and/or Burr Medico were available for discussion regarding this case.

## 2019-02-08 LAB — URINE CULTURE
MICRO NUMBER:: 492012
SPECIMEN QUALITY:: ADEQUATE

## 2019-02-28 ENCOUNTER — Telehealth: Payer: Self-pay | Admitting: *Deleted

## 2019-02-28 ENCOUNTER — Encounter: Payer: Self-pay | Admitting: Nurse Practitioner

## 2019-02-28 ENCOUNTER — Inpatient Hospital Stay: Payer: Medicare HMO | Attending: Oncology | Admitting: Nurse Practitioner

## 2019-02-28 ENCOUNTER — Other Ambulatory Visit: Payer: Self-pay

## 2019-02-28 VITALS — BP 122/78 | HR 81 | Temp 99.1°F | Resp 17 | Ht 62.0 in | Wt 191.3 lb

## 2019-02-28 DIAGNOSIS — Z803 Family history of malignant neoplasm of breast: Secondary | ICD-10-CM

## 2019-02-28 DIAGNOSIS — Z923 Personal history of irradiation: Secondary | ICD-10-CM | POA: Insufficient documentation

## 2019-02-28 DIAGNOSIS — C210 Malignant neoplasm of anus, unspecified: Secondary | ICD-10-CM | POA: Insufficient documentation

## 2019-02-28 NOTE — Progress Notes (Signed)
  Ashley Moore OFFICE PROGRESS NOTE   Diagnosis: Anal cancer  INTERVAL HISTORY:   Ashley Moore returns as scheduled.  She overall feels well.  Bowels moving fairly regularly.  She occasionally notes a small amount of blood on the toilet tissue.  This tends to occur when she is having "severe gas".  She continues surveillance exams with Dr. Quentin Cornwall and has an appointment this afternoon.  She reports a good appetite.  No nausea or vomiting.  Objective:  Vital signs in last 24 hours:  Blood pressure 122/78, pulse 81, temperature 99.1 F (37.3 C), temperature source Oral, resp. rate 17, height 5\' 2"  (1.575 m), weight 191 lb 4.8 oz (86.8 kg), SpO2 98 %.    Lymphatics: No palpable cervical, supraclavicular, axillary or inguinal lymph nodes. Resp: Lungs clear bilaterally. Cardio: Regular rate and rhythm. GI: Abdomen soft and nontender.  No hepatosplenomegaly.  No mass. Vascular: Trace edema at the lower legs bilaterally. Rectal: Digital rectal exam deferred due to appointment with Dr. Quentin Cornwall this afternoon.  Radiation skin change at the labia/perineum/perianal skin, associated edema.  No nodularity at the anal margin.   Lab Results:  Lab Results  Component Value Date   WBC 4.7 11/07/2017   HGB 12.7 11/07/2017   HCT 38.8 11/07/2017   MCV 85.3 11/07/2017   PLT 244.0 11/07/2017   NEUTROABS 3.1 11/07/2017    Imaging:  No results found.  Medications: I have reviewed the patient's current medications.  Assessment/Plan: 1. Anal cancer ? CT abdomen/pelvis 10/21/2016-thickening of the anus extending to the junction between the anus and rectum with a large stool ball in the rectum and mild fat stranding posterior to the rectum. Enlarged lymph nodes in the right and left inguinal regions. A few mildly prominent nodesseen posterior to the rectum. ? 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 ? PET scan [id number] hypermetabolic anal mass with  hypermetabolic metastases to the groin region bilaterally, left pelvic sidewall and presacral space. ? Initiation of radiation and cycle 1 5-FU/mitomycin C 11/14/2016 ? Cycle 2 5-FU/mitomycin C 12/12/2016 (5-FU dose reduced due to mucositis, diarrhea, skin breakdown) ? Radiation completed 12/23/2016 ? CT abdomen/pelvis 04/14/2017-resolution of anal mass and bilateral inguinal lymphadenopathy. No residual tumor seen. 2. Left labial lesions. Question direct extension from the anal cancer versus metastatic disease from anal cancer versus a separate malignant process. 3. History of pain and bleeding secondary to #1 and skin breakdown 4. 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 5. Multiple family members with breast cancer. 6. History of hypokalemia-likely secondary to decreased nutritional intake and diarrhea; potassium in normal range 01/16/2017. No longer taking a potassium supplement.    Disposition: [name] remains in clinical remission from anal cancer.  She will continue surveillance exams with Dr. Quentin Cornwall and has an appointment this afternoon.  She would like follow-up with radiation oncology.  We will contact their office to schedule an appointment.  She will return for a follow-up visit here in 1 year.  She will contact the office in the interim with any problems.  Plan reviewed with Dr. Benay Spice.  Ned Card ANP/GNP-BC   02/28/2019  11:25 AM

## 2019-02-28 NOTE — Telephone Encounter (Signed)
CALLED PATIENT TO ASK ABOUT COMING IN FOR A FU WITH ALISON PERKINS ON 06-18-19 @ 8:30 AM, PATIENT AGREED TO DATE AND TIME

## 2019-03-01 ENCOUNTER — Telehealth: Payer: Self-pay | Admitting: Nurse Practitioner

## 2019-03-01 NOTE — Telephone Encounter (Signed)
Scheduled appt per 6/11 sch message. A calendar will be mailed out.

## 2019-03-13 ENCOUNTER — Other Ambulatory Visit: Payer: Self-pay | Admitting: Neurology

## 2019-03-20 NOTE — Progress Notes (Signed)
Virtual Visit via Video Note  I connected with@ on 03/21/19 at  9:00 AM EDT by a video enabled telemedicine application and verified that I am speaking with the correct person using two identifiers. Location patient: home Location provider: home office Persons participating in the virtual visit: patient, provider  [place] national recommendations  regarding COVID 19 pandemic   video visit is advised over in office visit for this patient.  Patient aware  of the limitations of evaluation and management by telemedicine and  availability of in person appointments. and agreed to proceed.   HPI: Ashley Moore presents for video visit  For poss uti sx ... Dysuria beginning and end of void with frequency  Urgency  For 1 week    Minimal cloudiness but no  Fever flank pain chills  Vag sx ( uses premarin cr)  No vomiting or change in  bowel habits  Lives 45 min from our office and so collecting a specimen is an issues .  Was rx for uti with macrobid  May.  And got better right away ( 36 hours-48 hours) even though  u cx showed mult species      Has some eye irritation  ? environmental hand gel    No vision change and no contacts .  ROS: See pertinent positives and negatives per HPI.  Past Medical History:  Diagnosis Date  . Bowen's disease    excised 1992  . Family history of breast cancer   . Family history of lung cancer   . Family history of non-Hodgkin's lymphoma   . Family history of ovarian cancer   . Headache   . Hx of varicella   . 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([place])   . Thyroid cancer [facility])     Past Surgical History:  Procedure Laterality Date  . BUNIONECTOMY Right   . IR GENERIC HISTORICAL  11/14/2016   IR US GUIDE VASC ACCESS RIGHT 11/14/2016 [facility]  . IR GENERIC HISTORICAL  11/14/2016   IR FLUORO GUIDE CV LINE RIGHT 11/14/2016 [facility]  . IR GENERIC HISTORICAL  12/12/2016   IR US GUIDE VASC ACCESS RIGHT 12/12/2016 [name], MD [facility]  . IR GENERIC HISTORICAL   12/12/2016   IR FLUORO GUIDE CV LINE RIGHT 12/12/2016 [name], MD [facility]  . SKIN CANCER EXCISION     bowens disease    Family History  Problem Relation Age of Onset  . Lung cancer Mother        lung  . Breast cancer Mother 64  . Ovarian cancer Mother 57  . Pancreatitis Father   . Breast cancer Sister 62  . Breast cancer Maternal Grandmother 37       breast   . Breast cancer Maternal Aunt 75       breast  . Melanoma Sister   . Breast cancer Sister 70  . Non-Hodgkin's lymphoma Paternal Grandmother     Social History   Tobacco Use  . Smoking status: Former Research scientist (life sciences)  . Smokeless tobacco: Former Systems developer    Quit date: 09/20/1995  Substance Use Topics  . Alcohol use: Yes    Alcohol/week: 0.0 standard drinks    Comment: occasional  . Drug use: Yes    Types: Marijuana      Current Outpatient Medications:  .  conjugated estrogens (PREMARIN) vaginal cream, Place 1 Applicatorful vaginally at bedtime. Once twice a week, Disp: , Rfl:  .  meloxicam (MOBIC) 15 MG tablet, TAKE 1 TABLET EVERY DAY, Disp:  90 tablet, Rfl: 0 .  nitrofurantoin, macrocrystal-monohydrate, (MACROBID) 100 MG capsule, Take 1 capsule (100 mg total) by mouth 2 (two) times daily. For UTI, Disp: 14 capsule, Rfl: 0 .  tiZANidine (ZANAFLEX) 4 MG tablet, TAKE 1 TABLET EVERY 8 HOURS AS NEEDED FOR MUSCLE SPASM(S) (Patient taking differently: 6 mg. ), Disp: 90 tablet, Rfl: 5 .  Turmeric 500 MG CAPS, Take 1 capsule by mouth daily., Disp: , Rfl:   EXAM: BP Readings from Last 3 Encounters:  02/28/19 122/78  02/06/19 130/80  10/09/18 98/76    VITALS per patient if applicable:  GENERAL: alert, oriented, appears well and in no acute distress  HEENT: atraumatic, conjunttiva clear, no obvious abnormalities on inspection of external nose and ears NECK: normal movements of the head and neck LUNGS: on inspection no signs of respiratory distress, breathing rate appears normal, no obvious gross SOB, gasping or wheezing CV:  no obvious cyanosis PSYCH/NEURO: pleasant and cooperative, no obvious depression or anxiety, speech and thought processing grossly intact   ASSESSMENT AND PLAN:  Discussed the following assessment and plan:    ICD-10-CM   1. Possible urinary tract infection  R39.89    Same sx and reasonable to rx empirically  Today ( living 45 min away) and if  persistent or progressive recurrent then  Will need another evaluation UA micro and uCX  Etc  Consider getting  Uristix to helptest urine  . Let us know next week how doing after holiday . Or if fever alarm sx   Premarin should not make this worse .  Try otc Moisturin drops for  Eyes if needed and aware of irritative  Contact for now  Counseled.   Expectant management and discussion of plan and treatment with opportunity to ask questions and all were answered. The patient agreed with the plan and demonstrated an understanding of the instructions.   Advised to call back or seek an in-person evaluation if worsening  or having  further concerns .    Shanon Ace, MD

## 2019-03-21 ENCOUNTER — Other Ambulatory Visit: Payer: Self-pay | Admitting: Internal Medicine

## 2019-03-21 ENCOUNTER — Other Ambulatory Visit: Payer: Self-pay

## 2019-03-21 ENCOUNTER — Ambulatory Visit (INDEPENDENT_AMBULATORY_CARE_PROVIDER_SITE_OTHER): Payer: Medicare HMO | Admitting: Internal Medicine

## 2019-03-21 ENCOUNTER — Encounter: Payer: Self-pay | Admitting: Internal Medicine

## 2019-03-21 DIAGNOSIS — R3989 Other symptoms and signs involving the genitourinary system: Secondary | ICD-10-CM | POA: Diagnosis not present

## 2019-03-21 DIAGNOSIS — R945 Abnormal results of liver function studies: Secondary | ICD-10-CM

## 2019-03-21 DIAGNOSIS — R7989 Other specified abnormal findings of blood chemistry: Secondary | ICD-10-CM

## 2019-03-21 DIAGNOSIS — Z85048 Personal history of other malignant neoplasm of rectum, rectosigmoid junction, and anus: Secondary | ICD-10-CM

## 2019-03-21 DIAGNOSIS — D649 Anemia, unspecified: Secondary | ICD-10-CM

## 2019-03-21 DIAGNOSIS — Z79899 Other long term (current) drug therapy: Secondary | ICD-10-CM

## 2019-03-21 DIAGNOSIS — C210 Malignant neoplasm of anus, unspecified: Secondary | ICD-10-CM

## 2019-03-21 DIAGNOSIS — E049 Nontoxic goiter, unspecified: Secondary | ICD-10-CM

## 2019-03-21 MED ORDER — NITROFURANTOIN MONOHYD MACRO 100 MG PO CAPS: 100.0000 mg | ORAL_CAPSULE | Freq: Two times a day (BID) | ORAL | 0 refills | Status: DC

## 2019-03-21 NOTE — Progress Notes (Signed)
Future labs placed. 

## 2019-04-05 ENCOUNTER — Ambulatory Visit: Payer: Medicare HMO | Admitting: Internal Medicine

## 2019-04-05 ENCOUNTER — Telehealth: Payer: Self-pay

## 2019-04-05 NOTE — Telephone Encounter (Signed)
Copied from [place] [id number]. Topic: General - Other >> Apr 02, 2019  4:39 PM Wynetta Emery, [place] C wrote: Reason for CRM: Pt called in to update PCP. Pt says that she has been prescribed nitrofurantoin, macrocrystal-monohydrate, (MACROBID) 100 MG capsule, pt says that she is feeling great!

## 2019-04-09 ENCOUNTER — Telehealth: Payer: Self-pay | Admitting: Licensed Clinical Social Worker

## 2019-04-09 NOTE — Telephone Encounter (Signed)
Spoke with Ashley Moore about her genetic testing and confirmed that she would still be sending her sample as the lab has not received it yet. She says she will send it soon.

## 2019-05-23 ENCOUNTER — Other Ambulatory Visit: Payer: Self-pay | Admitting: Neurology

## 2019-05-23 ENCOUNTER — Other Ambulatory Visit: Payer: Self-pay

## 2019-05-23 ENCOUNTER — Ambulatory Visit (INDEPENDENT_AMBULATORY_CARE_PROVIDER_SITE_OTHER): Payer: Medicare HMO

## 2019-05-23 DIAGNOSIS — Z23 Encounter for immunization: Secondary | ICD-10-CM

## 2019-06-10 ENCOUNTER — Encounter: Payer: Self-pay | Admitting: Family Medicine

## 2019-06-10 ENCOUNTER — Ambulatory Visit (INDEPENDENT_AMBULATORY_CARE_PROVIDER_SITE_OTHER): Payer: Medicare HMO | Admitting: Family Medicine

## 2019-06-10 ENCOUNTER — Other Ambulatory Visit: Payer: Self-pay

## 2019-06-10 VITALS — BP 118/74 | HR 65 | Temp 97.6°F | Ht 62.0 in | Wt 185.1 lb

## 2019-06-10 DIAGNOSIS — R309 Painful micturition, unspecified: Secondary | ICD-10-CM | POA: Diagnosis not present

## 2019-06-10 DIAGNOSIS — R319 Hematuria, unspecified: Secondary | ICD-10-CM

## 2019-06-10 LAB — POCT URINALYSIS DIPSTICK
Bilirubin, UA: NEGATIVE
Blood, UA: POSITIVE
Glucose, UA: NEGATIVE
Ketones, UA: NEGATIVE
Nitrite, UA: NEGATIVE
Protein, UA: POSITIVE — AB
Spec Grav, UA: 1.02 (ref 1.010–1.025)
Urobilinogen, UA: 0.2 E.U./dL
pH, UA: 6 (ref 5.0–8.0)

## 2019-06-10 MED ORDER — CEPHALEXIN 500 MG PO CAPS: 500.0000 mg | ORAL_CAPSULE | Freq: Four times a day (QID) | ORAL | 0 refills | Status: DC

## 2019-06-10 NOTE — Patient Instructions (Signed)

## 2019-06-10 NOTE — Progress Notes (Signed)
Subjective:     Patient ID: Ashley Moore, female   DOB: [birth year], 68 y.o.   MRN: [id number]  HPI   Patient is seen with concern for possible UTI.  She states for about 3 to 4 weeks she has had some low-grade urine frequency and occasional intermittent burning with urination.  This past week she had 2 occasions where she states her urine was "pink "in color.  She was concerned there may be some blood.  She denies any fever or chills.  No flank pain.  No nausea or vomiting.  Her last urine culture back in May was negative.  Reported allergy to penicillin.  She does have history of some postmenopausal atrophic vaginitis and is on topical estrogen for that.  Past Medical History:  Diagnosis Date  . Bowen's disease    excised 1992  . Family history of breast cancer   . Family history of lung cancer   . Family history of non-Hodgkin's lymphoma   . Family history of ovarian cancer   . Headache   . Hx of varicella   . 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([place])   . Thyroid cancer [facility])    Past Surgical History:  Procedure Laterality Date  . BUNIONECTOMY Right   . IR GENERIC HISTORICAL  11/14/2016   IR US GUIDE VASC ACCESS RIGHT 11/14/2016 [facility]  . IR GENERIC HISTORICAL  11/14/2016   IR FLUORO GUIDE CV LINE RIGHT 11/14/2016 [facility]  . IR GENERIC HISTORICAL  12/12/2016   IR US GUIDE VASC ACCESS RIGHT 12/12/2016 [name], MD [facility]  . IR GENERIC HISTORICAL  12/12/2016   IR FLUORO GUIDE CV LINE RIGHT 12/12/2016 [name], MD [facility]  . SKIN CANCER EXCISION     bowens disease    reports that she has quit smoking. She quit smokeless tobacco use about 23 years ago. She reports current alcohol use. She reports current drug use. Drug: Marijuana. family history includes Breast cancer (age of onset: 92) in her mother; Breast cancer (age of onset: 11) in her sister and sister; Breast cancer (age of onset: 68) in her maternal aunt; Breast cancer (age of onset: 19) in her maternal grandmother;  Lung cancer in her mother; Melanoma in her sister; Non-Hodgkin's lymphoma in her paternal grandmother; Ovarian cancer (age of onset: 76) in her mother; Pancreatitis in her father. Allergies  Allergen Reactions  . Dilaudid [Hydromorphone Hcl]     Throws up  Cannot tolerate, IV or IM   . Benadryl [Diphenhydramine] Other (See Comments)    Tingle all over   . Melatonin     Tingle all over   . Penicillins Rash     Review of Systems  Constitutional: Negative for chills and fever.  Genitourinary: Positive for dysuria and frequency. Negative for decreased urine volume, difficulty urinating, flank pain, vaginal bleeding and vaginal pain.  Musculoskeletal: Negative for back pain.       Objective:   Physical Exam Constitutional:      Appearance: Normal appearance.  Cardiovascular:     Rate and Rhythm: Normal rate and regular rhythm.  Pulmonary:     Effort: Pulmonary effort is normal.     Breath sounds: Normal breath sounds.  Neurological:     Mental Status: She is alert.        Assessment:     Dysuria.  Rule out urinary infection.  Urine dipstick reveals 3+ blood and leukocytes.  Negative nitrites    Plan:     -Urine dipstick -  as above suggest possible infection so culture was ordered  -Cover with Keflex 500 mg 4 times daily for 5 days pending culture results  -Follow-up promptly for any fever, flank pain, or other concern  -Consider follow-up urine for microscopy if urine culture negative-to rule out any significant hematuria   Eulas Post MD [facility]

## 2019-06-12 ENCOUNTER — Telehealth: Payer: Self-pay | Admitting: Family Medicine

## 2019-06-12 LAB — URINE CULTURE
MICRO NUMBER:: 903812
SPECIMEN QUALITY:: ADEQUATE

## 2019-06-12 NOTE — Telephone Encounter (Signed)
Spoke to the pt.  She said the scar from the [place] is very tight and painful which she says is causing her vaginal canal to be tight.  I asked her what she is doing for this.  She has been using vaginal dilators but has only been able to work her way to the third one.  She is not having very much success.  She would like to know if there is anything else that can be done.  Advised that I would send a message to Dr. Regis Bill and either have the doctor or her assistant to call and discuss with her.  Will forward.

## 2019-06-12 NOTE — Telephone Encounter (Signed)
Sorry you are having problems  .  Have you take with GYNE/ if not then we need to have you see gyne for advice   Next step .

## 2019-06-13 NOTE — Telephone Encounter (Signed)
Left voicemail for pt to call back crm created okay to disclose

## 2019-06-18 ENCOUNTER — Encounter: Payer: Self-pay | Admitting: Radiation Oncology

## 2019-06-18 ENCOUNTER — Ambulatory Visit
Admission: RE | Admit: 2019-06-18 | Discharge: 2019-06-18 | Disposition: A | Discharge status: Home / Self Care | Payer: Medicare HMO | Source: Ambulatory Visit | Attending: Radiation Oncology | Admitting: Radiation Oncology

## 2019-06-18 ENCOUNTER — Other Ambulatory Visit: Payer: Self-pay

## 2019-06-18 VITALS — BP 123/83 | HR 82 | Resp 18 | Wt 185.0 lb

## 2019-06-18 DIAGNOSIS — Z08 Encounter for follow-up examination after completed treatment for malignant neoplasm: Secondary | ICD-10-CM | POA: Diagnosis not present

## 2019-06-18 DIAGNOSIS — Z85048 Personal history of other malignant neoplasm of rectum, rectosigmoid junction, and anus: Secondary | ICD-10-CM | POA: Diagnosis not present

## 2019-06-18 DIAGNOSIS — N761 Subacute and chronic vaginitis: Secondary | ICD-10-CM | POA: Diagnosis not present

## 2019-06-18 DIAGNOSIS — C210 Malignant neoplasm of anus, unspecified: Secondary | ICD-10-CM

## 2019-06-18 DIAGNOSIS — R600 Localized edema: Secondary | ICD-10-CM | POA: Diagnosis not present

## 2019-06-18 NOTE — Progress Notes (Signed)
Patient in for follow up post radiation therapy to vaginal area. States she has extra growth from vaginal area. Does have some bleeding during urinary track infection.

## 2019-06-18 NOTE — Progress Notes (Addendum)
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  Interval Since Last Radiation:  2 years, 5 months  11/14/16-12/23/16: 54 Gy to the pelvic nodes, anus, and bilateral groins.  Narrative: Ashley Moore is a pleasant 68 y.o. female with a history of squamous cell carcinoma of the anus. When she was diagnosed, she had advanced locoregional disease with bilateral inguinal adenopathy, PET positive presacral, left pelvic sidewall, and a lesion of the left labia consistent with a satellite lesion. She was treated with 5FU and radiotherapy and developed significant desquamation of the tissue. She has been seen due to labial agglutination by Dr. Myrene Galas and continues with premarin daily for this. She also is being followed by Dr. Quentin Cornwall in [place] for her anoscopy and is due to see both doctors in the next two months. She comes today for follow up . Of note her last scan on 04/13/17 did not reveal any residual adenopathy or residual tumor. Also of note she has had multiple klebsiella UTIs in the last year and just finished antibiotics last week.    On review of systems, the patient reports that she is doing okay. She reports she has had hematuria and pelvic pain, but since completing antibiotics has had improvement of this. She reports her lymphedema is pretty well controlled at this time as long as she continues to wear compression stockings and tries to elevate her legs when this flares. She is also trying to exercise as she states she's gained some weight since the covid pandemic which she attributes to baking at home more frequently. She has since stopped baking and is exercising every morning. She can tell an improvement in this. She reports she has had loss in her life  as two of her close friends recently passed away. She denies any chest pain, shortness of breath, cough, fevers, chills, night sweats. She denies any difficulty with bowel function and denies any rectal bleeding, itching or palpable abnormalities of the skin when washing or wiping. She does report fullness and pain in the vulvar area and believes this is causing her infections. She reports her skin of the vulva is not itching or burning, but just very sore to touch or when she tries to use her vaginal dilators. She denies abdominal pain, nausea or vomiting. She denies any new musculoskeletal or joint aches or pains, new skin lesions or concerns. A complete review of systems is obtained and is otherwise negative.   Past Medical History:  Past Medical History:  Diagnosis Date  . Bowen's disease    excised 1992  . Family history of breast cancer   . Family history of lung cancer   . Family history of non-Hodgkin's lymphoma   . Family history of ovarian cancer   . Headache   . Hx of varicella   . 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([place])   . Thyroid cancer [facility])     Past Surgical History: Past Surgical History:  Procedure Laterality Date  . BUNIONECTOMY Right   . IR GENERIC HISTORICAL  11/14/2016   IR US GUIDE VASC ACCESS RIGHT 11/14/2016 [facility]  . IR GENERIC HISTORICAL  11/14/2016   IR FLUORO GUIDE CV LINE RIGHT 11/14/2016 [facility]  . IR GENERIC HISTORICAL  12/12/2016  IR US GUIDE VASC ACCESS RIGHT January 04, 2017 [name], MD [facility]  . IR GENERIC HISTORICAL  01/04/2017   IR FLUORO GUIDE CV LINE RIGHT January 04, 2017 [name], MD [facility]  . SKIN CANCER EXCISION     bowens disease    Social History:  Social History   Socioeconomic History  . Marital status: Widowed    Spouse name: Not on file  . Number of children: 0  . Years of education: Not on file  . Highest education level: Not on file  Occupational History  . Not on file  Social Needs  . Financial resource strain: Not on file   . Food insecurity    Worry: Not on file    Inability: Not on file  . Transportation needs    Medical: Not on file    Non-medical: Not on file  Tobacco Use  . Smoking status: Former Research scientist (life sciences)  . Smokeless tobacco: Former Systems developer    Quit date: 09/20/1995  Substance and Sexual Activity  . Alcohol use: Yes    Alcohol/week: 0.0 standard drinks    Comment: occasional  . Drug use: Yes    Types: Marijuana  . Sexual activity: Never  Lifestyle  . Physical activity    Days per week: Not on file    Minutes per session: Not on file  . Stress: Not on file  Relationships  . Social Herbalist on phone: Not on file    Gets together: Not on file    Attends religious service: Not on file    Active member of club or organization: Not on file    Attends meetings of clubs or organizations: Not on file    Relationship status: Not on file  . Intimate partner violence    Fear of current or ex partner: Not on file    Emotionally abused: Not on file    Physically abused: Not on file    Forced sexual activity: Not on file  Other Topics Concern  . Not on file  Social History Narrative   H H  of 1      5 pets.   She is a former smoker   Retired Programmer, applications; Conservator, museum/gallery   etoh   Red wine  1 per night.    Tea green tea and earl gray    Moved from [place] to [place] area in 01/05/1984   1 pregnancy   Husband died spring  2016 cv   Sister died April 07, 2015  Bone cancer              Family History: Family History  Problem Relation Age of Onset  . Lung cancer Mother        lung  . Breast cancer Mother 30  . Ovarian cancer Mother 69  . Pancreatitis Father   . Breast cancer Sister 58  . Breast cancer Maternal Grandmother 30       breast   . Breast cancer Maternal Aunt 75       breast  . Melanoma Sister   . Breast cancer Sister 47  . Non-Hodgkin's lymphoma Paternal Grandmother      ALLERGIES:  is allergic to dilaudid  [hydromorphone hcl]; benadryl [diphenhydramine]; melatonin; and penicillins.  Meds: Current Outpatient Medications  Medication Sig Dispense Refill  . cholecalciferol (VITAMIN D3) 25 MCG (1000 UT) tablet Take 5,000 Units by mouth daily.    Marland Kitchen conjugated estrogens (PREMARIN) vaginal cream  Place 1 Applicatorful vaginally at bedtime. Once twice a week    . meloxicam (MOBIC) 15 MG tablet TAKE 1 TABLET EVERY DAY 90 tablet 0  . Multiple Vitamin (MULTIVITAMIN) tablet Take 1 tablet by mouth daily.    Marland Kitchen tiZANidine (ZANAFLEX) 4 MG tablet TAKE 1 TABLET EVERY 8 HOURS AS NEEDED FOR MUSCLE SPASM(S) (Patient taking differently: 6 mg. ) 90 tablet 5  . Turmeric 500 MG CAPS Take 1 capsule by mouth daily.     No current facility-administered medications for this encounter.     Physical Findings:  weight is 185 lb (83.9 kg). Her blood pressure is 123/83 and her pulse is 82. Her respiration is 18 and oxygen saturation is 100%.  Pain Assessment Pain Score: 1  Pain Frequency: Occasional Pain Loc: Vagina/10 In general this is a well appearing caucasian female in no acute distress. She is alert and oriented x4 and appropriate throughout the examination. HEENT reveals that the patient is normocephalic, atraumatic. Cardiopulmonary assessment is negative for acute distress and she exhibits normal effort.  Lower extremities are negative for pretibial pitting edema, deep calf tenderness, cyanosis or clubbing. Pelvic exam reveals normal appearing external female genitalia with hyperemic changes of the skin consistent with her prior treatment. She has one palpable node in the left groin that is non tender and mobile, and approximately 1 cm in size. The labia majora is agglutinated but the introitus is visible. There are more chronic woody changes of the labia majora, and of the agglutinated minora. She has multiple flat lesions of the majora that are flesh colored. No ulcerations are present, there is a pedunculated  condylomatous change from 10 o'clock to about 1 o'clock with the larges of the lesions being about 5 mm in the 1 o'clock position. Speculum exam is attempted but discontinued due to stenosis of the introitus and pressure that is intolerable from the redundant labia minora.   BUS areas which are normal in appearance.  Bimanual examination is negative for palpable mass in the pelvis, and the uterus is small and mobile. No scarring is noted in the vaginal vault. Rectovaginal exam is negative for induration or nodularity of the septum.   Lab Findings: Lab Results  Component Value Date   WBC 4.7 11/07/2017   HGB 12.7 11/07/2017   HCT 38.8 11/07/2017   MCV 85.3 11/07/2017   PLT 244.0 11/07/2017     Radiographic Findings: No results found.  Impression/Plan: 1. 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 The patient does not appear to have obvious signs of recurrence but I think she has either persistent verrucaous fibrosis versus condylomatous changes/low grade dysplastic findings on exam. I will set her up to see Dr. Denman George for evaluation to see if she thinks a biopsy is necessary. She will continue with Dr. Benay Spice and I will also discuss her palpable nodal findings on exam with him. She will follow up with Dr. Parke Poisson. She is due for anoscopy in December. I will plan to see her back in one year's time or sooner as needed.  2. 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 The patient continues to follow up with Dr. Myrene Galas and uses premarin daily. This has become more problematic recently as she's developed multiple UTIs from the redundency. I think she should continue with Dr. Myrene Galas for management of her atrophy, but will also ask Dr. Denman George what her thoughts are for surgical options given her recent issues. 3. Bilateral lower extremity lymphedema. This  continues to be managed with compression and we will follow the expectantly. 4. Breast health. The  patient's last mammogram was negative in January 2020.     Carola Rhine, PAC

## 2019-06-18 NOTE — Patient Instructions (Signed)
Coronavirus (COVID-19) Are you at risk?  Are you at risk for the Coronavirus (COVID-19)?  To be considered HIGH RISK for Coronavirus (COVID-19), you have to meet the following criteria:  . Traveled to [place], [place], [place], [place] or [place]; or in the [place] to [place], [place], [place], or [place]; and have fever, cough, and shortness of breath within the last 2 weeks of travel OR . Been in close contact with a person diagnosed with COVID-19 within the last 2 weeks and have fever, cough, and shortness of breath . IF YOU DO NOT MEET THESE CRITERIA, YOU ARE CONSIDERED LOW RISK FOR COVID-19.  What to do if you are HIGH RISK for COVID-19?  . If you are having a medical emergency, call 911. . Seek medical care right away. Before you go to a doctor's office, urgent care or emergency department, call ahead and tell them about your recent travel, contact with someone diagnosed with COVID-19, and your symptoms. You should receive instructions from your physician's office regarding next steps of care.  . When you arrive at healthcare provider, tell the healthcare staff immediately you have returned from visiting [place], [place], [place], [place] or [place]; or traveled in the [place] to [place], [place], [place], or [place]; in the last two weeks or you have been in close contact with a person diagnosed with COVID-19 in the last 2 weeks.   . Tell the health care staff about your symptoms: fever, cough and shortness of breath. . After you have been seen by a medical provider, you will be either: o Tested for (COVID-19) and discharged home on quarantine except to seek medical care if symptoms worsen, and asked to  - Stay home and avoid contact with others until you get your results (4-5 days)  - Avoid travel on public transportation if possible (such as bus, train, or airplane) or o Sent to the Emergency Department by EMS for evaluation, COVID-19 testing, and possible  admission depending on your condition and test results.  What to do if you are LOW RISK for COVID-19?  Reduce your risk of any infection by using the same precautions used for avoiding the common cold or flu:  . Wash your hands often with soap and warm water for at least 20 seconds.  If soap and water are not readily available, use an alcohol-based hand sanitizer with at least 60% alcohol.  . If coughing or sneezing, cover your mouth and nose by coughing or sneezing into the elbow areas of your shirt or coat, into a tissue or into your sleeve (not your hands). . Avoid shaking hands with others and consider head nods or verbal greetings only. . Avoid touching your eyes, nose, or mouth with unwashed hands.  . Avoid close contact with people who are sick. . Avoid places or events with large numbers of people in one location, like concerts or sporting events. . Carefully consider travel plans you have or are making. . If you are planning any travel outside or inside the [place], visit the CDC's Travelers' Health webpage for the latest health notices. . If you have some symptoms but not all symptoms, continue to monitor at home and seek medical attention if your symptoms worsen. . If you are having a medical emergency, call 911.   ADDITIONAL HEALTHCARE OPTIONS FOR PATIENTS  [facility] / e-Visit: [email or web address]         [facility]: [phone]  [facility]: [phone]                   [facility]: [phone]   

## 2019-06-19 ENCOUNTER — Telehealth: Payer: Self-pay | Admitting: Radiation Oncology

## 2019-06-24 ENCOUNTER — Telehealth: Payer: Self-pay | Admitting: Internal Medicine

## 2019-06-24 NOTE — Telephone Encounter (Signed)
lvm for pt to call back to set up virtual visit and lab appt to get urine

## 2019-06-24 NOTE — Telephone Encounter (Signed)
Pt has recurring UTIs and was instructed by her Oncologist to reach out to Dr. Regis Bill for rx for antibiotics. She stated she currently has a UTI. Please advise.

## 2019-06-25 ENCOUNTER — Telehealth: Payer: Self-pay | Admitting: *Deleted

## 2019-06-25 ENCOUNTER — Other Ambulatory Visit: Payer: Self-pay

## 2019-06-25 ENCOUNTER — Telehealth (INDEPENDENT_AMBULATORY_CARE_PROVIDER_SITE_OTHER): Payer: Medicare HMO | Admitting: Internal Medicine

## 2019-06-25 ENCOUNTER — Telehealth: Payer: Self-pay | Admitting: Radiation Oncology

## 2019-06-25 ENCOUNTER — Encounter: Payer: Self-pay | Admitting: Internal Medicine

## 2019-06-25 DIAGNOSIS — R300 Dysuria: Secondary | ICD-10-CM | POA: Diagnosis not present

## 2019-06-25 DIAGNOSIS — Z85048 Personal history of other malignant neoplasm of rectum, rectosigmoid junction, and anus: Secondary | ICD-10-CM

## 2019-06-25 DIAGNOSIS — R399 Unspecified symptoms and signs involving the genitourinary system: Secondary | ICD-10-CM

## 2019-06-25 DIAGNOSIS — C210 Malignant neoplasm of anus, unspecified: Secondary | ICD-10-CM

## 2019-06-25 LAB — POCT URINALYSIS DIPSTICK
Glucose, UA: NEGATIVE
Leukocytes, UA: NEGATIVE
Protein, UA: POSITIVE — AB
Spec Grav, UA: 1.015 (ref 1.010–1.025)
Urobilinogen, UA: 0.2 E.U./dL
pH, UA: 6 (ref 5.0–8.0)

## 2019-06-25 MED ORDER — SULFAMETHOXAZOLE-TRIMETHOPRIM 800-160 MG PO TABS: 1.0000 | ORAL_TABLET | Freq: Two times a day (BID) | ORAL | 0 refills | Status: DC

## 2019-06-25 NOTE — Telephone Encounter (Signed)
See notes from 06/25/19

## 2019-06-25 NOTE — Telephone Encounter (Signed)
Called and scheduled the patient for a follow up appt  

## 2019-06-25 NOTE — Telephone Encounter (Signed)
I left a VM for the patient letting her know Dr. Benay Spice recommended restaging CT scan given the left inguinal node on last week's exam. She will also be seen by GYN Onc in the near future and should receive a call to see Dr. Radford Pax, Dr. Serita Grit newest partner. We will follow up with her scans and course in the near future.

## 2019-06-25 NOTE — Progress Notes (Signed)
Virtual Visit via Video Note  I connected with@ on 06/25/19 at  3:00 PM EDT by a video enabled telemedicine application and verified that I am speaking with the correct person using two identifiers. Location patient: home Location provider:work  office Persons participating in the virtual visit: patient, provider  [place] national recommendations  regarding COVID 19 pandemic   video visit is advised over in office visit for this patient.  Patient aware  of the limitations of evaluation and management by telemedicine and  availability of in person appointments. and agreed to proceed. There was difficulty in connecting into my module for video so I called her on her phone while she was on video.  After 10 minutes of attempting connection.  HPI: Ashley Moore presents for video visit  She has had a history of fatigue last week and then a few days of painful urination without shaking chills or hematuria.  She has had UTIs in the past. Just recently today a lymph gland in her groin was discovered and they work-up is proceeding to check for recurrence of her anal cancer. There is no vomiting diarrhea flank pain or gross hematuria. 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  ROS: See pertinent positives and negatives per HPI.  Past Medical History:  Diagnosis Date  . Bowen's disease    excised 1992  . Family history of breast cancer   . Family history of lung cancer   . Family history of non-Hodgkin's lymphoma   . Family history of ovarian cancer   . Headache   . Hx of varicella   . 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([place])   . Thyroid cancer [facility])     Past Surgical History:  Procedure Laterality Date  . BUNIONECTOMY Right   . IR GENERIC HISTORICAL  11/14/2016   IR US GUIDE VASC ACCESS RIGHT 11/14/2016 [facility]  . IR GENERIC HISTORICAL  11/14/2016   IR FLUORO GUIDE CV LINE RIGHT 11/14/2016 [facility]  . IR GENERIC HISTORICAL  12/12/2016   IR US GUIDE VASC ACCESS RIGHT 12/12/2016 [name], MD [facility]  . IR GENERIC HISTORICAL  12/12/2016   IR FLUORO GUIDE CV LINE RIGHT 12/12/2016 [name], MD [facility]  . SKIN CANCER EXCISION     bowens disease    Family History  Problem Relation Age of Onset  . Lung cancer Mother        lung  . Breast cancer Mother 14  . Ovarian cancer Mother 63  . Pancreatitis Father   . Breast cancer Sister 75  . Breast cancer Maternal Grandmother 60       breast   . Breast cancer Maternal Aunt 75       breast  . Melanoma Sister   . Breast cancer Sister 86  . Non-Hodgkin's lymphoma Paternal Grandmother     Social History   Tobacco Use  . Smoking status: Former Research scientist (life sciences)  . Smokeless tobacco: Former Systems developer    Quit date: 09/20/1995  Substance Use Topics  . Alcohol use: Yes    Alcohol/week: 0.0 standard drinks    Comment: occasional  . Drug use: Yes    Types: Marijuana      Current Outpatient Medications:  .  cholecalciferol (VITAMIN D3) 25 MCG (1000 UT) tablet, Take 5,000 Units by mouth daily., Disp: , Rfl:  .  conjugated estrogens (PREMARIN) vaginal cream, Place 1 Applicatorful vaginally at bedtime. Once twice a week, Disp: , Rfl:  .  meloxicam (MOBIC) 15 MG  tablet, TAKE 1 TABLET EVERY DAY, Disp: 90 tablet, Rfl: 0 .  Multiple Vitamin (MULTIVITAMIN) tablet, Take 1 tablet by mouth daily., Disp: , Rfl:  .  sulfamethoxazole-trimethoprim (BACTRIM DS) 800-160 MG tablet, Take 1 tablet by mouth 2 (two) times daily. For UTI, Disp: 10 tablet, Rfl: 0 .  tiZANidine (ZANAFLEX) 4 MG tablet, TAKE 1 TABLET EVERY 8 HOURS AS NEEDED FOR MUSCLE SPASM(S) (Patient taking differently: 6 mg. ), Disp: 90 tablet, Rfl: 5 .  Turmeric 500 MG CAPS, Take 1 capsule by mouth daily., Disp: , Rfl:   EXAM: BP Readings from Last 3 Encounters:  06/18/19 123/83  06/10/19 118/74  02/28/19 122/78    VITALS per patient if applicable:  GENERAL: alert, oriented, appears well and in no acute distress her speech is normal without dyspnea cough gasping or  wheezing.  PSYCH/NEURO: pleasant and cooperative, no obvious depression or anxiety, speech and thought processing grossly intact There was a delay of the urinalysis in the system but positive for leukocytes and sent for culture.  ASSESSMENT AND PLAN:  Discussed the following assessment and plan:    ICD-10-CM   1. Urinary tract infection symptoms  R39.9   2. Dysuria  R30.0 POCT urinalysis dipstick    Culture, Urine  3. History of rectal cancer  Z85.048    Will treat empirically pending culture after into the visit reviewed and noted abnormal labs and future blood work was pending.  There are future orders from July and include thyroid which was abnormal and last checked.  We did not discuss this at this visit. Counseled.   Expectant management and discussion of plan and treatment with opportunity to ask questions and all were answered. The patient agreed with the plan and demonstrated an understanding of the instructions.   Advised to call back or seek an in-person evaluation if worsening  or having  further concerns .  I provided 17 minutes of non-face-to-face time during this encounter.   Shanon Ace, MD    Lab Results  Component Value Date   WBC 4.7 11/07/2017   HGB 12.7 11/07/2017   HCT 38.8 11/07/2017   PLT 244.0 11/07/2017   GLUCOSE 77 11/07/2017   CHOL 182 11/07/2017   TRIG 87.0 11/07/2017   HDL 79.90 11/07/2017   LDLCALC 85 11/07/2017   ALT 31 11/07/2017   AST 39 (H) 11/07/2017   NA 138 11/07/2017   K 4.1 11/07/2017   CL 103 11/07/2017   CREATININE 0.69 11/07/2017   BUN 17 11/07/2017   CO2 30 11/07/2017   TSH 6.11 (H) 11/07/2017   HGBA1C 5.5 05/11/2015

## 2019-06-26 ENCOUNTER — Telehealth: Payer: Self-pay | Admitting: *Deleted

## 2019-06-26 NOTE — Telephone Encounter (Signed)
Called patient to inform of stat labs on 06-27-19 @ 12:15 pm @ [facility], and her CT on 06-27-19 - arrival time- 1:15 pm @ [facility], pt.to be NPO- 4 hrs. prior to test and patient to pick-up contrast today @ [facility] for CT on 06-27-19, patient to receive results from Shona Simpson on 07-01-19 @ 1:30 pm via telephne, spoke with patient and she is aware of these appts.

## 2019-06-27 ENCOUNTER — Encounter (HOSPITAL_COMMUNITY): Payer: Self-pay

## 2019-06-27 ENCOUNTER — Other Ambulatory Visit: Payer: Self-pay

## 2019-06-27 ENCOUNTER — Ambulatory Visit
Admission: RE | Admit: 2019-06-27 | Discharge: 2019-06-27 | Disposition: A | Discharge status: Home / Self Care | Payer: Medicare HMO | Source: Ambulatory Visit | Attending: Radiation Oncology | Admitting: Radiation Oncology

## 2019-06-27 ENCOUNTER — Ambulatory Visit (HOSPITAL_COMMUNITY)
Admission: RE | Admit: 2019-06-27 | Discharge: 2019-06-27 | Disposition: A | Discharge status: Home / Self Care | Payer: Medicare HMO | Source: Ambulatory Visit | Attending: Radiation Oncology | Admitting: Radiation Oncology

## 2019-06-27 ENCOUNTER — Telehealth: Payer: Self-pay | Admitting: Radiation Oncology

## 2019-06-27 DIAGNOSIS — N3289 Other specified disorders of bladder: Secondary | ICD-10-CM | POA: Diagnosis not present

## 2019-06-27 DIAGNOSIS — I700 Atherosclerosis of aorta: Secondary | ICD-10-CM | POA: Diagnosis not present

## 2019-06-27 DIAGNOSIS — C210 Malignant neoplasm of anus, unspecified: Secondary | ICD-10-CM | POA: Diagnosis not present

## 2019-06-27 DIAGNOSIS — K5730 Diverticulosis of large intestine without perforation or abscess without bleeding: Secondary | ICD-10-CM | POA: Diagnosis not present

## 2019-06-27 DIAGNOSIS — K7689 Other specified diseases of liver: Secondary | ICD-10-CM | POA: Diagnosis not present

## 2019-06-27 DIAGNOSIS — C44520 Squamous cell carcinoma of anal skin: Secondary | ICD-10-CM | POA: Diagnosis not present

## 2019-06-27 DIAGNOSIS — I2510 Atherosclerotic heart disease of native coronary artery without angina pectoris: Secondary | ICD-10-CM | POA: Diagnosis not present

## 2019-06-27 LAB — URINE CULTURE
MICRO NUMBER:: 959436
SPECIMEN QUALITY:: ADEQUATE

## 2019-06-27 LAB — BUN & CREATININE (CHCC)
BUN: 23 mg/dL (ref 8–23)
Creatinine: 1 mg/dL (ref 0.44–1.00)
GFR, Est AFR Am: >60 mL/min (ref >60)
GFR, Estimated: 57 mL/min — ABNORMAL LOW (ref >60)

## 2019-06-27 MED ORDER — SODIUM CHLORIDE (PF) 0.9 % IJ SOLN
INTRAMUSCULAR | Status: AC
  Filled 2019-06-27: qty 50

## 2019-06-27 MED ORDER — IOHEXOL 300 MG/ML  SOLN
100.0000 mL | Freq: Once | INTRAMUSCULAR | Status: AC | PRN
  Administered 2019-06-27: 14:00:00 100 mL via INTRAVENOUS

## 2019-06-27 NOTE — Telephone Encounter (Signed)
I called and left a message for the patient letting her know her CT was negative for recurrent disease. 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

## 2019-07-01 ENCOUNTER — Ambulatory Visit: Payer: Self-pay | Admitting: Radiation Oncology

## 2019-07-02 ENCOUNTER — Encounter: Payer: Self-pay | Admitting: Gynecologic Oncology

## 2019-07-02 DIAGNOSIS — Z85048 Personal history of other malignant neoplasm of rectum, rectosigmoid junction, and anus: Secondary | ICD-10-CM | POA: Diagnosis not present

## 2019-07-02 DIAGNOSIS — R350 Frequency of micturition: Secondary | ICD-10-CM | POA: Diagnosis not present

## 2019-07-02 DIAGNOSIS — Q525 Fusion of labia: Secondary | ICD-10-CM | POA: Diagnosis not present

## 2019-07-02 DIAGNOSIS — R300 Dysuria: Secondary | ICD-10-CM | POA: Diagnosis not present

## 2019-07-02 DIAGNOSIS — R3915 Urgency of urination: Secondary | ICD-10-CM | POA: Diagnosis not present

## 2019-07-02 NOTE — Progress Notes (Signed)
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  Radiation: 11/14/16-12/23/16: 54 Gy to the pelvic nodes, anus, and bilateral groins.  At diagnosis, she had advanced locoregional disease with bilateral inguinal adenopathy, PET positive presacral, left pelvic sidewall, and a lesion of the left labia consistent with a satellite lesion. She was treated with 5FU and radiotherapy and developed significant desquamation of the tissue. She has been seen due to labial agglutination by Dr. Myrene Galas and continues with premarin daily for this. She also is being followed by Dr. Quentin Cornwall in [place] for her anoscopy.    Anal cancer ([place])  11/03/2016 Initial Diagnosis   Anal cancer ([place])   06/27/2019 Imaging   CT C/A/P - No evidence of recurrent or metastatic carcinoma.   Mild diffuse bladder wall thickening consistent with cystitis, likely due to prior radiation therapy. Colonic diverticulosis, without radiographic evidence of diverticulitis. Aortic and coronary artery atherosclerosis.    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  Since that time, the patient has been treated with 5-FU and radiation, completed in April 2018.  She developed significant desquamation of the tissue during treatment.  Interval History: She reports being seen last week with what was felt to be an enlarged lymph node.  She underwent CT scan on the eighth that showed no evidence of recurrent or metastatic disease however did show diffuse bladder wall thickening likely secondary to prior radiation.  Patient was seen at [facility] yesterday by urogynecology who have diagnosed her with overactive bladder.  She also suffers from some  incontinence.  She was counseled on keeping a voiding log, decreasing irritating substances such as caffeine and was scribed Vesicare (which she has not started yet).  She already notes a difference in her symptoms since limiting her caffeine to 1 cup of coffee this morning.  She also is followed by Dr. Myrene Galas for labial agglutination and has been on daily Premarin since after she completed radiation.  She was using dilators post radiation but has not for the last month and a half secondary to pain with dilator insertion.  She has used her finger to manually stretch the vaginal tissue posteriorly.  Patient denies any discharge or vaginal bleeding.  She continues to have some passage of blood when she has flatus.  She notes normal bowel function with good stool consistency.  She endorses a good appetite without nausea or vomiting.  The patient denies being sexually active given her pain.  She also describes feeling like a "freak" and disfigured secondary to her labial agglutination.  She became tearful when discussing this with me.  Positive urine cultures in the last 2.5 years 06/10/19: Klebsiella UTI 09/2017: Klebsiella UTI 01/2017: Ecoli UTI  Past Medical/Surgical History: Past Medical History:  Diagnosis Date  . Bowen's disease    excised 1992  . Family history of breast cancer   . Family history of lung cancer   . Family history of non-Hodgkin's lymphoma   . Family history of ovarian cancer   . Headache   . Hx of varicella   . 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([place])   . Thyroid cancer [facility])     Past Surgical History:  Procedure Laterality Date  . BUNIONECTOMY Right   .  IR GENERIC HISTORICAL  11/14/2016   IR US GUIDE VASC ACCESS RIGHT 11/14/2016 [facility]  . IR GENERIC HISTORICAL  11/14/2016   IR FLUORO GUIDE CV LINE RIGHT 11/14/2016 [facility]  . IR GENERIC HISTORICAL  01/05/17   IR US GUIDE VASC ACCESS RIGHT 05-Jan-2017 Ashley Mariscal, Ashley Moore [facility]  . IR GENERIC HISTORICAL  2017-01-05   IR  FLUORO GUIDE CV LINE RIGHT 2017-01-05 Ashley Mariscal, Ashley Moore [facility]  . SKIN CANCER EXCISION     bowens disease    Family History  Problem Relation Age of Onset  . Lung cancer Mother        lung  . Breast cancer Mother 69  . Ovarian cancer Mother 28  . Pancreatitis Father   . Breast cancer Sister 26  . Breast cancer Maternal Grandmother 56       breast   . Breast cancer Maternal Aunt 75       breast  . Melanoma Sister   . Breast cancer Sister 7  . Non-Hodgkin's lymphoma Paternal Grandmother    OB History  Obstetric Comments  Last mammogram: 09/2018, BIRADS 1  Bone density: 07/2018  Last pap: 01/05/17 - negative, HPV +    Social History   Socioeconomic History  . Marital status: Widowed    Spouse name: Not on file  . Number of children: 0  . Years of education: Not on file  . Highest education level: Not on file  Occupational History  . Not on file  Social Needs  . Financial resource strain: Not on file  . Food insecurity    Worry: Not on file    Inability: Not on file  . Transportation needs    Medical: Not on file    Non-medical: Not on file  Tobacco Use  . Smoking status: Former Research scientist (life sciences)  . Smokeless tobacco: Former Systems developer    Quit date: 09/20/1995  Substance and Sexual Activity  . Alcohol use: Yes    Alcohol/week: 0.0 standard drinks    Comment: occasional  . Drug use: Yes    Types: Marijuana  . Sexual activity: Never  Lifestyle  . Physical activity    Days per week: Not on file    Minutes per session: Not on file  . Stress: Not on file  Relationships  . Social Herbalist on phone: Not on file    Gets together: Not on file    Attends religious service: Not on file    Active member of club or organization: Not on file    Attends meetings of clubs or organizations: Not on file    Relationship status: Not on file  Other Topics Concern  . Not on file  Social History Narrative   H H  of 1      5 pets.   She is a former smoker   Retired Forensic psychologist; Conservator, museum/gallery   etoh   Red wine  1 per night.    Tea green tea and earl gray    Moved from [place] to [facility] area in 01-06-84   1 pregnancy   Husband died spring  2016 cv   Sister died 04-08-15  Bone cancer              Current Medications:  Current Outpatient Medications:  .  cholecalciferol (VITAMIN D3) 25 MCG (1000 UT) tablet, Take 5,000 Units by mouth daily., Disp: , Rfl:  .  conjugated estrogens (PREMARIN) vaginal cream, Place 0.5 Applicatorfuls vaginally at bedtime. , Disp: , Rfl:  .  meloxicam (MOBIC) 15 MG tablet, TAKE 1 TABLET EVERY DAY, Disp: 90 tablet, Rfl: 0 .  Multiple Vitamin (MULTIVITAMIN) tablet, Take 1 tablet by mouth daily., Disp: , Rfl:  .  tiZANidine (ZANAFLEX) 4 MG tablet, TAKE 1 TABLET EVERY 8 HOURS AS NEEDED FOR MUSCLE SPASM(S) (Patient taking differently: 6 mg. ), Disp: 90 tablet, Rfl: 5 .  Turmeric 500 MG CAPS, Take 1 capsule by mouth daily., Disp: , Rfl:  .  solifenacin (VESICARE) 5 MG tablet, Take by mouth daily. , Disp: , Rfl:   Review of Symptoms: Complete 10-system review is negative except for abdominal pain, pain with urination, urinary frequency, hard/dry/red vulvar skin.  Physical Exam: Blood pressure (!) 143/90, pulse 94, temperature 98.9 F (37.2 C), temperature source Tympanic, resp. rate 20, height 5\' 2"  (1.575 m), weight 184 lb (83.5 kg). Body mass index is 33.65 kg/m. General: Alert, oriented, no acute distress. HEENT: Posterior oropharynx clear, sclera anicteric. Chest: Clear to auscultation bilaterally.   Cardiovascular: Regular rate and rhythm, no murmurs. Abdomen: Obese, soft, nontender.  Normoactive bowel sounds.  No masses or hepatosplenomegaly appreciated.   Extremities: Grossly normal range of motion.  Warm, well perfused.  Dema noted in bilateral lower extremities, compression socks up to the knees in place. Skin: No rashes or lesions noted. Lymphatics: No cervical,  supraclavicular, or inguinal adenopathy. GU: Vulva notable for radiation changes with wood-like and red honeycomb appearance.  There are several brown/flesh colored patches on hairbearing areas on the left side of the vulva that are nonerythematous and not raised or tender to the touch.  The vulvar tissue is somewhat indurated.  There are very small (less than 2 mm) nodules along the superior aspect of the labia majora and over the mons spanning from approximately 11:00 to 1:00, nontender and nonfirm.  The inferior aspect of bilateral labia minora are surgically absent.  The superior aspect of the labia are fused, visually during the urethra.  With manipulation, of the fused labia can be lifted to reveal the unobstructed urethra.  On speculum exam, a Peterson speculum was able to be inserted to the level of the anterior cervix which is normal in appearance although atrophic.  The vaginal mucosa is moderately atrophic with changes consistent with radiation.  A Pap test was collected from the anterior cervix.  No discharge or blood noted within the vault.  On very gentle exam, the patient had significant pain with finger placement into the external vaginal orifice.  Laboratory & Radiologic Studies: CT C/A/P 10/8: IMPRESSION: No evidence of recurrent or metastatic carcinoma.  Mild diffuse bladder wall thickening consistent with cystitis, likely due to prior radiation therapy.  Colonic diverticulosis, without radiographic evidence of diverticulitis.  Aortic and coronary artery atherosclerosis.  Assessment & Plan: Ashley Moore is a 68 y.o. woman with history of squamous cell carcinoma of the anus who completed chemoradiation in 2018 who also has a remote history of Bowen's disease requiring vulvar resection.  1.  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  We discussed continuing to use the estrogen but also placing  some on the agglutinated labia themselves.  The labial tissue appears healthy with good perfusion.  We discussed the option of surgical separation in the operating room as an outpatient surgery.  While I think it would be possible, I have concerns about  the patient's healing postop given her history of radiation.  We reviewed that the postoperative period will require continued use of estrogen cream as well as a barrier like petroleum jelly.  I am also concerned that she would be at higher risk of refusion of her labia.  We decided that she would think some about this.  She understands that from my goal perspective, I think if we were successful it would improve her urine stream but would not change anything related to the pain that she is having.  She voices significant emotional distress regarding the physical appearance of her vulva and seems as interested in the idea of surgery for this as for the change that it would cause in her urinary stream.  2.  Pelvic pain-patient has been unable to use dilators recently secondary to increasing introital and vaginal pain, which I suspect is in large part secondary to her history of radiation.  She underwent a short course of pelvic physical therapy immediately after completing radiation.  She was interested in the idea of referring her again for physical therapy to discuss alternative exercises and treatments beside dilator use.  3.  Vulvar lesions-the patient was noted to have multiple labial/vulvar lesions on her last exam earlier this month and radiation oncology.  On my exam, it is difficult to appreciate these lesions.  The patient noted that today the area is much less noticeable and less firm than it had been the prior week. Additionally, her lower extremity edema is significantly improved today.  I suspect that these lesions were related to lymphedema.  I have asked the patient to call or send a message if she has recurrence prior to her next visit.  4. Pap  test was obtained today given positive HPV on pap 2 years ago.  I will plan to speak with the patient by phone for follow-up in a month's time.  At that visit we will discuss need for further intervention, surgery or a visit in clinic.  More than half of this 15-minute visit was spent in direct patient contact and face-to-face counseling.  Ashley Pinch, Ashley Moore  Division of Gynecologic Oncology  Department of Obstetrics and Gynecology  [facility] Division of [facility]

## 2019-07-03 ENCOUNTER — Other Ambulatory Visit: Payer: Self-pay

## 2019-07-03 ENCOUNTER — Encounter: Payer: Self-pay | Admitting: Gynecologic Oncology

## 2019-07-03 ENCOUNTER — Inpatient Hospital Stay: Payer: Medicare HMO | Attending: Gynecologic Oncology | Admitting: Gynecologic Oncology

## 2019-07-03 VITALS — BP 143/90 | HR 94 | Temp 98.9°F | Resp 20 | Ht 62.0 in | Wt 184.0 lb

## 2019-07-03 DIAGNOSIS — R102 Pelvic and perineal pain: Secondary | ICD-10-CM | POA: Diagnosis not present

## 2019-07-03 DIAGNOSIS — Z923 Personal history of irradiation: Secondary | ICD-10-CM

## 2019-07-03 DIAGNOSIS — Z85048 Personal history of other malignant neoplasm of rectum, rectosigmoid junction, and anus: Secondary | ICD-10-CM

## 2019-07-03 DIAGNOSIS — K5730 Diverticulosis of large intestine without perforation or abscess without bleeding: Secondary | ICD-10-CM | POA: Insufficient documentation

## 2019-07-03 DIAGNOSIS — Q525 Fusion of labia: Secondary | ICD-10-CM | POA: Diagnosis not present

## 2019-07-03 DIAGNOSIS — I2510 Atherosclerotic heart disease of native coronary artery without angina pectoris: Secondary | ICD-10-CM | POA: Insufficient documentation

## 2019-07-03 DIAGNOSIS — Z9221 Personal history of antineoplastic chemotherapy: Secondary | ICD-10-CM

## 2019-07-03 DIAGNOSIS — A630 Anogenital (venereal) warts: Secondary | ICD-10-CM

## 2019-07-03 DIAGNOSIS — Z124 Encounter for screening for malignant neoplasm of cervix: Secondary | ICD-10-CM

## 2019-07-03 DIAGNOSIS — N3090 Cystitis, unspecified without hematuria: Secondary | ICD-10-CM | POA: Insufficient documentation

## 2019-07-03 DIAGNOSIS — N94819 Vulvodynia, unspecified: Secondary | ICD-10-CM

## 2019-07-03 DIAGNOSIS — C7982 Secondary malignant neoplasm of genital organs: Secondary | ICD-10-CM

## 2019-07-03 NOTE — Patient Instructions (Signed)
We placed a referral for you to see pelvic physical therapy here in [place].  If they have not reached out to you in the next week, please call their office using the number on the handout you were given.  Today we discussed treatment options including pelvic physical therapy, nerve injections for your pelvic pain and surgery for the labial agglutination.  The fused labia are not impeding the stream of urine out of your urethra but are very likely contributing to the spray of urine given their location.  It may be possible to perform a surgery to separate the labia although there is a risk for re- fusion.  We will have a visit by phone in a month to discuss how you are doing with physical therapy and further treatment planning.  Please call our clinic with any questions or issues prior to that at [phone].  Jeral Pinch MD

## 2019-07-04 ENCOUNTER — Other Ambulatory Visit: Payer: Self-pay

## 2019-07-04 ENCOUNTER — Encounter: Payer: Self-pay | Admitting: Physical Therapy

## 2019-07-04 ENCOUNTER — Encounter: Payer: Self-pay | Admitting: Gynecologic Oncology

## 2019-07-04 ENCOUNTER — Ambulatory Visit: Payer: Medicare HMO | Attending: Gynecologic Oncology | Admitting: Physical Therapy

## 2019-07-04 DIAGNOSIS — M6281 Muscle weakness (generalized): Secondary | ICD-10-CM | POA: Diagnosis not present

## 2019-07-04 DIAGNOSIS — Z483 Aftercare following surgery for neoplasm: Secondary | ICD-10-CM | POA: Diagnosis not present

## 2019-07-04 DIAGNOSIS — R279 Unspecified lack of coordination: Secondary | ICD-10-CM | POA: Diagnosis not present

## 2019-07-04 DIAGNOSIS — R252 Cramp and spasm: Secondary | ICD-10-CM | POA: Diagnosis not present

## 2019-07-04 NOTE — Therapy (Signed)
[facility] [address] [place], [place], [address] Phone: [phone]   Fax:  [phone]  Physical Therapy Evaluation  Patient Details  Name: QUANTERIA TEICHMAN MRN: [id number] Date of Birth: [birth year] Referring Provider (PT): Dr. Joylene John   Encounter Date: 07/04/2019  PT End of Session - 07/04/19 1557    Visit Number  1    Date for PT Re-Evaluation  08/29/19    PT Start Time  1536    PT Stop Time  1610    PT Time Calculation (min)  34 min    Activity Tolerance  Patient tolerated treatment well;No increased pain    Behavior During Therapy  WFL for tasks assessed/performed       Past Medical History:  Diagnosis Date  . Bowen's disease    excised 1992  . Family history of breast cancer   . Family history of lung cancer   . Family history of non-Hodgkin's lymphoma   . Family history of ovarian cancer   . Headache   . Hx of varicella   . 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([place])   . Thyroid cancer [facility])     Past Surgical History:  Procedure Laterality Date  . BUNIONECTOMY Right   . IR GENERIC HISTORICAL  11/14/2016   IR US GUIDE VASC ACCESS RIGHT 11/14/2016 [facility]  . IR GENERIC HISTORICAL  11/14/2016   IR FLUORO GUIDE CV LINE RIGHT 11/14/2016 [facility]  . IR GENERIC HISTORICAL  12/12/2016   IR US GUIDE VASC ACCESS RIGHT 12/12/2016 [name], MD [facility]  . IR GENERIC HISTORICAL  12/12/2016   IR FLUORO GUIDE CV LINE RIGHT 12/12/2016 [name], MD [facility]  . SKIN CANCER EXCISION     bowens disease    There were no vitals filed for this visit.   Subjective Assessment - 07/04/19 1538    Subjective  Patient reports she had a rash that left her with pain. Patient felt she had a UTI. Patient has tried to use the dilator but too painful to touch herself. The bladder is thicken from the radiation. Patient has seen doctors and found out she has had a bladder problem and drinking the wrong liquids. Patient  has an overactive bladder. The coffe and sodas aggravate the bladder. Talked to doctors to do surgery on her vagina. Surgery to separate the labias due to fusion. The other surgery to open the vaginal opening. Patient is using premarin and looking into a pill to get more estrogen into her system. I was up to the 4th dilator and down to the third dilator. Patient uses her middle finger and pushes to open up the bottom aspect of the vaginal canal.    Patient Stated Goals  reduce pain, expanding the vaginal canal    Currently in Pain?  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 Joylene John    Onset Date/Surgical Date  07/03/18    Prior Therapy  yes      Precautions   Precautions  Other (comment)    Precaution Comments  cancer with radiation for anal cancer      Restrictions   Weight Bearing Restrictions  No      Balance Screen   Has the patient fallen in the past 6 months  No    Has the patient had a decrease in activity level because of a fear of falling?   No    Is the patient reluctant to leave their home because of a fear of falling?   No      Home Film/video editor residence      Prior Function   Level of Independence  Independent    Vocation  Self employed      Cognition   Overall Cognitive Status  Within Functional Limits for tasks assessed      Posture/Postural Control   Posture/Postural Control  No significant limitations      ROM / Strength   AROM / PROM / Strength   AROM;PROM;Strength      AROM   Overall AROM Comments  --    Lumbar Extension  decreased by 25%      Strength   Right Hip ABduction  3+/5      Palpation   Palpation comment  tenderness located suprapubically                Objective measurements completed on examination: See above findings.    Pelvic Floor Special Questions - 07/04/19 0001    Urinary Leakage  Yes    How often  uses charts for bladder leakage    Pad use  no pads    Fecal incontinence  Yes   first thing in the morning after a bowel movement   External Perineal Exam  labial minora is larger on the left and the clitoral hood encases the clitorus    External Palpation  thicking of the of the outside of the vaginal canal from the radiation, blotching appearance of the skin  left more than right    Pelvic Floor Internal Exam  Patient confirms identification and approves PT to assess the pelvic floor and treatment    Exam Type  Vaginal    Palpation  tightness in the posterior fourchette, perineal body, right urethra sphincter, firmness throughout the levator ani and obturator internist    Strength  fair squeeze, definite lift               PT Education - 07/04/19 1611    Education Details  gave patient [place] with lidocaine to work on the vaginal Doctor, hospital) Educated  Patient    Methods  Explanation    Comprehension  Verbalized understanding       PT Short Term Goals - 07/04/19 1558      PT SHORT TERM GOAL #1   Title  independent with initial HEP    Time  4    Period  Weeks    Status  New    Target Date  08/01/19      PT SHORT TERM GOAL #2   Title  understand how to use the dilators to work on perineal soft tissue work with pain level </= 5/10    Time  4  Period  Weeks    Status  New    Target Date  08/01/19        PT Long Term Goals - 07/04/19 1711      PT LONG TERM GOAL #1   Title  independent with HEP    Time  8    Period  Weeks    Status  New     Target Date  08/29/19      PT LONG TERM GOAL #2   Title  ability to use the largest size dilator with no pain >/= 3/10and understand she will have to use it for 2 years    Time  8    Period  Weeks    Status  New    Target Date  08/29/19      PT LONG TERM GOAL #3   Title  urine stream able to go straight and not have to clean herself due to spraying of urine    Time  8    Period  Weeks    Status  New    Target Date  08/29/19      PT LONG TERM GOAL #4   Title  fecal incontinence decreased >/= 75% due to increase control of pelvic floor muscles    Time  8    Period  Weeks    Status  New    Target Date  08/29/19      PT LONG TERM GOAL #5   Title  --             Plan - 07/04/19 1703    Clinical Impression Statement  Patient is a 68 year old female with history of anal cancer with radiation 2 years ago. Patient was trained on how to use a dilator for vaginal opening in the past. Patient reports no urinary leakage but some fecal leakage after she has a bowel movement in the morning. Patient perineal skin is blotchy and thick from the radiation. Patient clitoral hood is covering the clitorus. Patient reports her pain is 9/10 when stretching the vaginal canal. Patient is using the third size dialtor but before was using the 4th size. Patient urine is splattering when urinating. Patient labia minora are larger than normal and fusing to the labia majora. Pelvic floor strength is 3/5. Patient levator ani and obturator internist are thick due to the changes from radiation. Patient will benefit from skilled therapy to improve vaginal opening to go to the 5 th dilator size and want to have intimate relations with a partner.    Personal Factors and Comorbidities  Comorbidity 1;Time since onset of injury/illness/exacerbation;Age    Comorbidities  anal cancer with radiation, thyroid cancer    Examination-Activity Limitations  Toileting;Continence    Examination-Participation Restrictions   Interpersonal Relationship    Stability/Clinical Decision Making  Evolving/Moderate complexity    Clinical Decision Making  Low    Rehab Potential  Good    PT Frequency  1x / week    PT Duration  8 weeks    PT Treatment/Interventions  Biofeedback;Therapeutic activities;Therapeutic exercise;Neuromuscular re-education;Patient/family education;Dry needling;Scar mobilization;Manual techniques    PT Next Visit Plan  work on vagina canal to open, soft tissue work to the perineal area    Consulted and Agree with Plan of Care  Patient       Patient will benefit from skilled therapeutic intervention in order to improve the following deficits and impairments:  Decreased coordination, Increased fascial restricitons, Pain, Decreased strength  Visit Diagnosis:  Muscle weakness (generalized) - Plan: PT plan of care cert/re-cert  Cramp and spasm - Plan: PT plan of care cert/re-cert  Unspecified lack of coordination - Plan: PT plan of care cert/re-cert  Aftercare following surgery for neoplasm - Plan: PT plan of care cert/re-cert     Problem List Patient Active Problem List   Diagnosis Date Noted  . Family history of breast cancer   . Family history of lung cancer   . Family history of ovarian cancer   . Family history of non-Hodgkin's lymphoma   . 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 Port catheter in place 11/21/2016  . 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 Anal cancer ([place]) 11/03/2016  . Cystitis 05/06/2015  . Degenerative cervical disc 04/08/2015  . Trigger point of neck 03/26/2015  . Pain in joint, ankle and foot 04/23/2013  . Visit for preventive health examination 02/13/2013  . Routine gynecological examination 02/13/2013  . Family history of breast cancer in first degree relative 02/13/2013  . Rash and nonspecific skin eruption 02/13/2013  . 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 VITAMIN D DEFICIENCY 06/25/2008  . NECK PAIN 06/02/2008  . FOOT PAIN 06/02/2008  .  OSTEOPENIA 06/02/2008    Earlie Counts, PT 07/04/19 5:14 PM   [facility] [address] [place], [place], [address] Phone: [phone]   Fax:  [phone]  Name: JALAIAH DOLENCE MRN: [id number] Date of Birth: 17-Nov-1950

## 2019-07-09 ENCOUNTER — Telehealth: Payer: Self-pay

## 2019-07-09 LAB — CYTOLOGY - PAP
Comment: NEGATIVE
Diagnosis: NEGATIVE
High risk HPV: NEGATIVE

## 2019-07-09 NOTE — Telephone Encounter (Signed)
LM for Ms Rumberger that the Pap Smear was normal and no HPV detected. She can call the office if she has any questions or concerns.  Report released in my chart as well per Joylene John, NP.

## 2019-07-18 ENCOUNTER — Ambulatory Visit: Payer: Medicare HMO | Admitting: Physical Therapy

## 2019-07-18 ENCOUNTER — Telehealth: Payer: Self-pay | Admitting: Physical Therapy
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
## 2019-07-25 ENCOUNTER — Encounter: Payer: Self-pay | Admitting: Physical Therapy

## 2019-07-25 ENCOUNTER — Other Ambulatory Visit: Payer: Self-pay

## 2019-07-25 ENCOUNTER — Ambulatory Visit: Payer: Medicare HMO | Attending: Gynecologic Oncology | Admitting: Physical Therapy

## 2019-07-25 ENCOUNTER — Telehealth: Payer: Self-pay | Admitting: *Deleted

## 2019-07-25 DIAGNOSIS — R252 Cramp and spasm: Secondary | ICD-10-CM | POA: Insufficient documentation

## 2019-07-25 DIAGNOSIS — M6281 Muscle weakness (generalized): Secondary | ICD-10-CM | POA: Diagnosis not present

## 2019-07-25 DIAGNOSIS — R279 Unspecified lack of coordination: Secondary | ICD-10-CM

## 2019-07-25 DIAGNOSIS — Z483 Aftercare following surgery for neoplasm: Secondary | ICD-10-CM | POA: Diagnosis not present

## 2019-07-25 NOTE — Telephone Encounter (Signed)
Patient appt moved from 11/11 to 11/10

## 2019-07-25 NOTE — Therapy (Signed)
[facility] [address] [place], [place], [address] Phone: [phone]   Fax:  [phone]  Physical Therapy Treatment  Patient Details  Name: Ashley Moore MRN: [id number] Date of Birth: 13-Oct-1950 Referring Provider (PT): Dr. Joylene John   Encounter Date: 07/25/2019  PT End of Session - 07/25/19 1447    Visit Number  2    Date for PT Re-Evaluation  08/29/19    Authorization Type  [employer]    PT Start Time  [id number]    PT Stop Time  1525    PT Time Calculation (min)  40 min    Activity Tolerance  Patient tolerated treatment well;No increased pain    Behavior During Therapy  WFL for tasks assessed/performed       Past Medical History:  Diagnosis Date  . Bowen's disease    excised 1992  . Family history of breast cancer   . Family history of lung cancer   . Family history of non-Hodgkin's lymphoma   . Family history of ovarian cancer   . Headache   . Hx of varicella   . 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([place])   . Thyroid cancer [facility])     Past Surgical History:  Procedure Laterality Date  . BUNIONECTOMY Right   . IR GENERIC HISTORICAL  11/14/2016   IR US GUIDE VASC ACCESS RIGHT 11/14/2016 [facility]  . IR GENERIC HISTORICAL  11/14/2016   IR FLUORO GUIDE CV LINE RIGHT 11/14/2016 [facility]  . IR GENERIC HISTORICAL  12/12/2016   IR US GUIDE VASC ACCESS RIGHT 12/12/2016 [name], MD [facility]  . IR GENERIC HISTORICAL  12/12/2016   IR FLUORO GUIDE CV LINE RIGHT 12/12/2016 [name], MD [facility]  . SKIN CANCER EXCISION     bowens disease    There were no vitals filed for this visit.  Subjective Assessment - 07/25/19 1449    Subjective  I feel musch better since initial eval. I have no pain at all. I feel like my stomach is down alot. I am following the diet. I am on the third dilator.    Patient Stated Goals  reduce pain, expanding the vaginal canal    Currently in Pain?  No/denies                     Pelvic Floor Special Questions - 07/25/19 0001    Pelvic Floor Internal Exam  Patient confirms identification and approves PT to assess the pelvic floor and treatment    Exam Type  Vaginal        [facility] Adult PT Treatment/Exercise - 07/25/19 0001      Self-Care   Self-Care  Other Self-Care Comments      Manual Therapy   Manual Therapy  Soft tissue mobilization;Internal Pelvic Floor    Manual therapy comments  educated patient on how to perform the soft tissue work to inner thigh and perineal area    Soft tissue mobilization  gentle soft tissue work to the labia, inner thigh, hip adductor insertion, gluteal area, perineal body, mons pubis   initially tips of 2 fingers and after full two finger in   Internal Pelvic Floor  along the levator ani and introitus to expand the tissue             PT Education - 07/25/19 1622    Education Details  instructed patient on how to perform self massage to the outside of the perineum to keep  the tissue soft    Person(s) Educated  Patient    Methods  Explanation;Demonstration    Comprehension  Verbalized understanding;Returned demonstration       PT Short Term Goals - 07/04/19 1558      PT SHORT TERM GOAL #1   Title  independent with initial HEP    Time  4    Period  Weeks    Status  New    Target Date  08/01/19      PT SHORT TERM GOAL #2   Title  understand how to use the dilators to work on perineal soft tissue work with pain level </= 5/10    Time  4    Period  Weeks    Status  New    Target Date  08/01/19        PT Long Term Goals - 07/04/19 1711      PT LONG TERM GOAL #1   Title  independent with HEP    Time  8    Period  Weeks    Status  New    Target Date  08/29/19      PT LONG TERM GOAL #2   Title  ability to use the largest size dilator with no pain >/= 3/10and understand she will have to use it for 2 years    Time  8    Period  Weeks    Status  New    Target Date  08/29/19       PT LONG TERM GOAL #3   Title  urine stream able to go straight and not have to clean herself due to spraying of urine    Time  8    Period  Weeks    Status  New    Target Date  08/29/19      PT LONG TERM GOAL #4   Title  fecal incontinence decreased >/= 75% due to increase control of pelvic floor muscles    Time  8    Period  Weeks    Status  New    Target Date  08/29/19      PT LONG TERM GOAL #5   Title  --            Plan - 07/25/19 1448    Clinical Impression Statement  Patient is not having pain in the vaginal area with using the dilator and is ready to go to the next size. Patient is using the [facility] REvelum at home and is helping with the tissue and discomfort. Patient has improved tissue mobility after the manual work. Patient has tightness in the hip adductors. Patient will benefit from skilled therapy to improve vaginal opening to go to the 5th dilator size and want to have intimate relations with a partner.    Personal Factors and Comorbidities  Comorbidity 1;Time since onset of injury/illness/exacerbation;Age    Comorbidities  anal cancer with radiation, thyroid cancer    Examination-Activity Limitations  Toileting;Continence    Examination-Participation Restrictions  Interpersonal Relationship    Stability/Clinical Decision Making  Evolving/Moderate complexity    Rehab Potential  Good    PT Frequency  1x / week    PT Duration  8 weeks    PT Treatment/Interventions  Biofeedback;Therapeutic activities;Therapeutic exercise;Neuromuscular re-education;Patient/family education;Dry needling;Scar mobilization;Manual techniques    PT Next Visit Plan  work on vagina canal to open, soft tissue work to the perineal area; go over stretches and bulging of the pelvic floor  Recommended Other Services  MD signed initial eval    Consulted and Agree with Plan of Care  Patient       Patient will benefit from skilled therapeutic intervention in order to improve the  following deficits and impairments:  Decreased coordination, Increased fascial restricitons, Pain, Decreased strength  Visit Diagnosis: Muscle weakness (generalized)  Cramp and spasm  Unspecified lack of coordination  Aftercare following surgery for neoplasm     Problem List Patient Active Problem List   Diagnosis Date Noted  . Family history of breast cancer   . Family history of lung cancer   . Family history of ovarian cancer   . Family history of non-Hodgkin's lymphoma   . 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 Port catheter in place 11/21/2016  . 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 Cystitis 05/06/2015  . Degenerative cervical disc 04/08/2015  . Trigger point of neck 03/26/2015  . Pain in joint, ankle and foot 04/23/2013  . Visit for preventive health examination 02/13/2013  . Routine gynecological examination 02/13/2013  . Family history of breast cancer in first degree relative 02/13/2013  . Rash and nonspecific skin eruption 02/13/2013  . 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 VITAMIN D DEFICIENCY 06/25/2008  . NECK PAIN 06/02/2008  . FOOT PAIN 06/02/2008  . OSTEOPENIA 06/02/2008    Earlie Counts, PT 07/25/19 4:27 PM   [facility] [address], [place], [place], [address] Phone: [phone]   Fax:  [phone]  Name: LAIKIN LONGTIN MRN: [id number] Date of Birth: Mar 15, 1951

## 2019-07-29 NOTE — Progress Notes (Signed)
Gynecologic Oncology Telehealth Consult Note: Gyn-Onc  I connected with Reva Bores on 07/30/19 at  2:30 PM EST by telephone and verified that I am speaking with the correct person using two identifiers.  I discussed the limitations, risks, security and privacy concerns of performing an evaluation and management service by telemedicine and the availability of in-person appointments. I also discussed with the patient that there may be a patient responsible charge related to this service. The patient expressed understanding and agreed to proceed.  No ther persons participated in the visit.  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  Radiation: 11/14/16-12/23/16: 54 Gy to the pelvic nodes, anus, and bilateral groins.  At diagnosis, she had advanced locoregional disease with bilateral inguinal adenopathy, PET positive presacral, left pelvic sidewall, and a lesion of the left labia consistent with a satellite lesion. She was treated with 5FU and radiotherapy and developed significant desquamation of the tissue. She has been seen due to labial agglutination by Dr. Myrene Galas and continues with premarin daily for this. She also is being followed by Dr. Quentin Cornwall in [place] for her anoscopy.    Anal cancer ([place])  11/03/2016 Initial Diagnosis   Anal cancer ([place])   06/27/2019 Imaging   CT C/A/P - No evidence of recurrent or metastatic carcinoma.   Mild diffuse bladder wall thickening consistent with cystitis, likely due to prior radiation therapy. Colonic diverticulosis, without radiographic evidence of diverticulitis. Aortic and coronary artery atherosclerosis.     Interval History: The patient reports significant improvement since her visit with me.  "I feel like a new person."  She notes that she feels things  are about 90% back to normal.  With her new eating and drinking habits, her bladder and associated pain seem to be much better.  She also notes more normal urine flow and associated urine symptoms.  She feels that the capacity of her bladder has increased.  She has had 2 visits with the pelvic physical therapist which she has enjoyed.  She feels that the physical therapy is helping with her lymphedema and she is working with the dilators.  She is still using the same size but her pain level when using the dilator is almost 0.  She was given a numbing lubricant by the physical therapist which has helped greatly.  She has been cleared by her insurance to continue physical therapy until April.  She has an appointment with her urogynecologist in December.  She is also seeing Dr. Florentina Addison, her surgical oncologist in [place], in early December.  She has discussed with him the appearance of her perineum and he has apparently offered surgery in an attempt to remove some of the scar tissue.  Past Medical/Surgical History: Past Medical History:  Diagnosis Date  . Bowen's disease    excised 1992  . Family history of breast cancer   . Family history of lung cancer   . Family history of non-Hodgkin's lymphoma   . Family history of ovarian cancer   . Headache   . Hx of varicella   . 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([place])   . Thyroid cancer [facility])     Past Surgical History:  Procedure Laterality Date  . BUNIONECTOMY Right   . IR GENERIC HISTORICAL  11/14/2016   IR US GUIDE VASC ACCESS RIGHT 11/14/2016 [facility]  . IR GENERIC HISTORICAL  11/14/2016  IR FLUORO GUIDE CV LINE RIGHT 11/14/2016 [facility]  . IR GENERIC HISTORICAL  2016-12-31   IR US GUIDE VASC ACCESS RIGHT 12-31-16 [name], MD [facility]  . IR GENERIC HISTORICAL  2016-12-31   IR FLUORO GUIDE CV LINE RIGHT Dec 31, 2016 [name], MD [facility]  . SKIN CANCER EXCISION     bowens disease    Family History  Problem Relation Age of Onset  . Lung  cancer Mother        lung  . Breast cancer Mother 51  . Ovarian cancer Mother 27  . Pancreatitis Father   . Breast cancer Sister 60  . Breast cancer Maternal Grandmother 65       breast   . Breast cancer Maternal Aunt 75       breast  . Melanoma Sister   . Breast cancer Sister 49  . Non-Hodgkin's lymphoma Paternal Grandmother     Social History   Socioeconomic History  . Marital status: Widowed    Spouse name: Not on file  . Number of children: 0  . Years of education: Not on file  . Highest education level: Not on file  Occupational History  . Not on file  Social Needs  . Financial resource strain: Not on file  . Food insecurity    Worry: Not on file    Inability: Not on file  . Transportation needs    Medical: Not on file    Non-medical: Not on file  Tobacco Use  . Smoking status: Former Research scientist (life sciences)  . Smokeless tobacco: Former Systems developer    Quit date: 09/20/1995  Substance and Sexual Activity  . Alcohol use: Yes    Alcohol/week: 0.0 standard drinks    Comment: occasional  . Drug use: Yes    Types: Marijuana  . Sexual activity: Never  Lifestyle  . Physical activity    Days per week: Not on file    Minutes per session: Not on file  . Stress: Not on file  Relationships  . Social Herbalist on phone: Not on file    Gets together: Not on file    Attends religious service: Not on file    Active member of club or organization: Not on file    Attends meetings of clubs or organizations: Not on file    Relationship status: Not on file  Other Topics Concern  . Not on file  Social History Narrative   H H  of 1      5 pets.   She is a former smoker   Retired Programmer, applications; Conservator, museum/gallery   etoh   Red wine  1 per night.    Tea green tea and earl gray    Moved from [place] to [facility] area in 01-Jan-1984   1 pregnancy   Husband died spring  2016 cv   Sister died 04-03-2015  Bone cancer              Current  Medications:  Current Outpatient Medications:  .  cholecalciferol (VITAMIN D3) 25 MCG (1000 UT) tablet, Take 5,000 Units by mouth daily., Disp: , Rfl:  .  conjugated estrogens (PREMARIN) vaginal cream, Place 0.5 Applicatorfuls vaginally at bedtime. , Disp: , Rfl:  .  meloxicam (MOBIC) 15 MG tablet, TAKE 1 TABLET EVERY DAY, Disp: 90 tablet, Rfl: 0 .  Multiple Vitamin (MULTIVITAMIN) tablet, Take 1 tablet by mouth daily., Disp: , Rfl:  .  solifenacin (VESICARE) 5 MG tablet, Take by mouth daily. , Disp: , Rfl:  .  tiZANidine (ZANAFLEX) 4 MG tablet, TAKE 1 TABLET EVERY 8 HOURS AS NEEDED FOR MUSCLE SPASM(S) (Patient taking differently: 6 mg. ), Disp: 90 tablet, Rfl: 5 .  Turmeric 500 MG CAPS, Take 1 capsule by mouth daily., Disp: , Rfl:   Review of Symptoms: Complete is negative except as above in Interval History.  Physical Exam: There were no vitals taken for this visit. Unable to be performed due to the nature of this visit.  Laboratory & Radiologic Studies: 10/14: Pap negative, HPV negative  Assessment & Plan: ERYKA BUSTIN is a 68 y.o. woman with a history of squamous cell carcinoma of the anus who completed chemoradiation in 2018 who also has a remote history of Bowen's disease requiring vulvar resection now with pelvic pain and labial agglutination.  The patient overall has had significant improvement in her dietary changes as well as pelvic physical therapy.  She sounds hopeful and offers thanks for the various doctors who have been involved in her improved symptoms.  She is continue to use vaginal estrogen cream.  We discussed her thoughts about surgery to separate the anterior labia.  Given the significant improvement in her symptoms as well as promising work with physical therapy, I recommended that we wait several months to reassess.  If the patient ultimately wishes to pursue an attempt at separating the labia, I would recommend that we wait until after she is finished working  with physical therapy as the surgery would require a pause from PT as she heals.  The patient was amenable to this plan and we will schedule a phone visit in approximately 3 months for follow-up.  I discussed the assessment and treatment plan with the patient. The patient was provided with an opportunity to ask questions and all were answered. The patient agreed with the plan and demonstrated an understanding of the instructions.   The patient was advised to call back or see an in-person evaluation if the symptoms worsen or if the condition fails to improve as anticipated.   I provided 15 minutes of non face-to-face telephone visit time during this encounter, and > 50% was spent counseling as documented under my assessment & plan.   Jeral Pinch, MD  Division of Gynecologic Oncology  Department of Obstetrics and Gynecology  [facility]

## 2019-07-30 ENCOUNTER — Encounter: Payer: Self-pay | Admitting: Gynecologic Oncology

## 2019-07-30 ENCOUNTER — Telehealth: Payer: Self-pay | Admitting: *Deleted

## 2019-07-30 ENCOUNTER — Inpatient Hospital Stay: Payer: Medicare HMO | Attending: Gynecologic Oncology | Admitting: Gynecologic Oncology

## 2019-07-30 DIAGNOSIS — Z85048 Personal history of other malignant neoplasm of rectum, rectosigmoid junction, and anus: Secondary | ICD-10-CM | POA: Diagnosis not present

## 2019-07-30 DIAGNOSIS — Q525 Fusion of labia: Secondary | ICD-10-CM

## 2019-07-30 DIAGNOSIS — R102 Pelvic and perineal pain: Secondary | ICD-10-CM

## 2019-07-30 DIAGNOSIS — N895 Stricture and atresia of vagina: Secondary | ICD-10-CM | POA: Diagnosis not present

## 2019-07-30 NOTE — Telephone Encounter (Signed)
Called the patient, scheduled an appt for Feb

## 2019-07-30 NOTE — Patient Instructions (Signed)
Happy to hear how well you are doing. We will plan to talk by phone again in 3 months to reassess how you are feeling about surgery.

## 2019-07-31 ENCOUNTER — Ambulatory Visit: Payer: Medicare HMO | Admitting: Gynecologic Oncology

## 2019-08-01 ENCOUNTER — Other Ambulatory Visit: Payer: Self-pay

## 2019-08-01 ENCOUNTER — Encounter: Payer: Self-pay | Admitting: Physical Therapy

## 2019-08-01 ENCOUNTER — Ambulatory Visit: Payer: Medicare HMO | Admitting: Physical Therapy

## 2019-08-01 DIAGNOSIS — Z483 Aftercare following surgery for neoplasm: Secondary | ICD-10-CM | POA: Diagnosis not present

## 2019-08-01 DIAGNOSIS — R252 Cramp and spasm: Secondary | ICD-10-CM

## 2019-08-01 DIAGNOSIS — R279 Unspecified lack of coordination: Secondary | ICD-10-CM | POA: Diagnosis not present

## 2019-08-01 DIAGNOSIS — M6281 Muscle weakness (generalized): Secondary | ICD-10-CM | POA: Diagnosis not present

## 2019-08-01 NOTE — Therapy (Signed)
[facility] [address] [place], [place], [address] Phone: [phone]   Fax:  [phone]  Physical Therapy Treatment  Patient Details  Name: Ashley Moore MRN: [id number] Date of Birth: 03-Nov-1950 Referring Provider (PT): Dr. Joylene John   Encounter Date: 08/01/2019  PT End of Session - 08/01/19 1618    Visit Number  3    Date for PT Re-Evaluation  08/29/19    Authorization Type  [employer]    PT Start Time  [id number]    PT Stop Time  [id number]    PT Time Calculation (min)  45 min    Activity Tolerance  Patient tolerated treatment well;No increased pain    Behavior During Therapy  WFL for tasks assessed/performed       Past Medical History:  Diagnosis Date  . Bowen's disease    excised 1992  . Family history of breast cancer   . Family history of lung cancer   . Family history of non-Hodgkin's lymphoma   . Family history of ovarian cancer   . Headache   . Hx of varicella   . 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([place])   . Thyroid cancer [facility])     Past Surgical History:  Procedure Laterality Date  . BUNIONECTOMY Right   . IR GENERIC HISTORICAL  11/14/2016   IR US GUIDE VASC ACCESS RIGHT 11/14/2016 [facility]  . IR GENERIC HISTORICAL  11/14/2016   IR FLUORO GUIDE CV LINE RIGHT 11/14/2016 [facility]  . IR GENERIC HISTORICAL  12/12/2016   IR US GUIDE VASC ACCESS RIGHT 12/12/2016 [name], MD [facility]  . IR GENERIC HISTORICAL  12/12/2016   IR FLUORO GUIDE CV LINE RIGHT 12/12/2016 [name], MD [facility]  . SKIN CANCER EXCISION     bowens disease    There were no vitals filed for this visit.  Subjective Assessment - 08/01/19 1538    Subjective  The REvelum is helping me use the dilator. I am not able to go to the next size yet.    Patient Stated Goals  reduce pain, expanding the vaginal canal    Currently in Pain?  No/denies                    Pelvic Floor Special Questions - 08/01/19 0001    Pelvic  Floor Internal Exam  Patient confirms identification and approves PT to assess the pelvic floor and treatment    Exam Type  Vaginal        [facility] Adult PT Treatment/Exercise - 08/01/19 0001      Self-Care   Self-Care  Other Self-Care Comments    Other Self-Care Comments   massage the perineum, mons pubis, and labia dailly, reviewed on how to progress the dilator size and not going past 3/10 pain. Patient verbally understands      Manual Therapy   Manual Therapy  Soft tissue mobilization;Internal Pelvic Floor    Soft tissue mobilization  gentle soft tissue work to the labia, inner thigh, hip adductor insertion, gluteal area, perineal body, mons pubis   initially tips of 2 fingers and after full two finger in   Internal Pelvic Floor  along the levator ani, sides of the bladder, obturator internist  and introitus to expand the tissue             PT Education - 08/01/19 1614    Education Details  further instruction on how to use the dilator with stretching of the  introitus, using the next size in to where it goes and let it sit    Person(s) Educated  Patient    Methods  Explanation    Comprehension  Verbalized understanding       PT Short Term Goals - 08/01/19 1706      PT SHORT TERM GOAL #1   Title  independent with initial HEP    Time  4    Period  Weeks    Status  Achieved    Target Date  08/01/19      PT SHORT TERM GOAL #2   Title  understand how to use the dilators to work on perineal soft tissue work with pain level </= 5/10    Time  4    Period  Weeks    Status  Achieved    Target Date  08/01/19      PT SHORT TERM GOAL #3   Title  understand how to do self perineal massage to separate the labia minora    Time  4    Period  Weeks    Status  Achieved      PT SHORT TERM GOAL #4   Title  fecal leakage decreased >/= 25% due to improved pelvic floor muscle control    Time  4    Period  Weeks    Status  Achieved        PT Long Term Goals - 07/04/19 1711       PT LONG TERM GOAL #1   Title  independent with HEP    Time  8    Period  Weeks    Status  New    Target Date  08/29/19      PT LONG TERM GOAL #2   Title  ability to use the largest size dilator with no pain >/= 3/10and understand she will have to use it for 2 years    Time  8    Period  Weeks    Status  New    Target Date  08/29/19      PT LONG TERM GOAL #3   Title  urine stream able to go straight and not have to clean herself due to spraying of urine    Time  8    Period  Weeks    Status  New    Target Date  08/29/19      PT LONG TERM GOAL #4   Title  fecal incontinence decreased >/= 75% due to increase control of pelvic floor muscles    Time  8    Period  Weeks    Status  New    Target Date  08/29/19      PT LONG TERM GOAL #5   Title  --            Plan - 08/01/19 1618    Clinical Impression Statement  Patient has not gone to the next dilator yet due to the restriction of the tissue. After therapy the therapist was able to place 2 fingers into the vaginal 2 inches. Patient skin and tissue is thick around the vaginal and labia due to the changes from radiation. Patient had one time of urinary leakage due to the tightness of the tissue. Patient has tightness in the levator ani and obturator internist and perineal body. Patient understands she is to massage the vaginal area daily due to the thickness of the tissue. Patient will benefit from skilled therapy to elongation of the  tissue to incresae the dilation of the tissue.    Personal Factors and Comorbidities  Comorbidity 1;Time since onset of injury/illness/exacerbation;Age    Comorbidities  anal cancer with radiation, thyroid cancer    Examination-Activity Limitations  Toileting;Continence    Examination-Participation Restrictions  Interpersonal Relationship    Stability/Clinical Decision Making  Evolving/Moderate complexity    Rehab Potential  Good    PT Frequency  1x / week    PT Duration  8 weeks    PT  Treatment/Interventions  Biofeedback;Therapeutic activities;Therapeutic exercise;Neuromuscular re-education;Patient/family education;Dry needling;Scar mobilization;Manual techniques    PT Next Visit Plan  work on vagina canal to open, soft tissue work to the perineal area; go over stretches and bulging of the pelvic floor    Consulted and Agree with Plan of Care  Patient       Patient will benefit from skilled therapeutic intervention in order to improve the following deficits and impairments:  Decreased coordination, Increased fascial restricitons, Pain, Decreased strength  Visit Diagnosis: Muscle weakness (generalized)  Cramp and spasm  Unspecified lack of coordination  Aftercare following surgery for neoplasm     Problem List Patient Active Problem List   Diagnosis Date Noted  . Family history of breast cancer   . Family history of lung cancer   . Family history of ovarian cancer   . Family history of non-Hodgkin's lymphoma   . 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 Port catheter in place 11/21/2016  . 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 Cystitis 05/06/2015  . Degenerative cervical disc 04/08/2015  . Trigger point of neck 03/26/2015  . Pain in joint, ankle and foot 04/23/2013  . Visit for preventive health examination 02/13/2013  . Routine gynecological examination 02/13/2013  . Family history of breast cancer in first degree relative 02/13/2013  . Rash and nonspecific skin eruption 02/13/2013  . 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 VITAMIN D DEFICIENCY 06/25/2008  . NECK PAIN 06/02/2008  . FOOT PAIN 06/02/2008  . OSTEOPENIA 06/02/2008    Earlie Counts, PT 08/01/19 5:07 PM   [facility] [address] [place], [place], [address] Phone: [phone]   Fax:  [phone]  Name: Ashley Moore MRN: [id number] Date of Birth: [birth year]

## 2019-08-06 ENCOUNTER — Other Ambulatory Visit: Payer: Self-pay | Admitting: Neurology

## 2019-08-06 DIAGNOSIS — Q525 Fusion of labia: Secondary | ICD-10-CM | POA: Diagnosis not present

## 2019-08-06 DIAGNOSIS — R3915 Urgency of urination: Secondary | ICD-10-CM | POA: Diagnosis not present

## 2019-08-06 DIAGNOSIS — R350 Frequency of micturition: Secondary | ICD-10-CM | POA: Diagnosis not present

## 2019-08-06 DIAGNOSIS — N3281 Overactive bladder: Secondary | ICD-10-CM | POA: Diagnosis not present

## 2019-08-08 ENCOUNTER — Encounter: Payer: Self-pay | Admitting: Physical Therapy

## 2019-08-08 ENCOUNTER — Ambulatory Visit: Payer: Medicare HMO | Admitting: Physical Therapy

## 2019-08-08 ENCOUNTER — Other Ambulatory Visit: Payer: Self-pay

## 2019-08-08 DIAGNOSIS — M6281 Muscle weakness (generalized): Secondary | ICD-10-CM | POA: Diagnosis not present

## 2019-08-08 DIAGNOSIS — Z483 Aftercare following surgery for neoplasm: Secondary | ICD-10-CM | POA: Diagnosis not present

## 2019-08-08 DIAGNOSIS — R252 Cramp and spasm: Secondary | ICD-10-CM

## 2019-08-08 DIAGNOSIS — R279 Unspecified lack of coordination: Secondary | ICD-10-CM | POA: Diagnosis not present

## 2019-08-08 NOTE — Therapy (Signed)
[facility] [address] [place], [place], [address] Phone: [phone]   Fax:  [phone]  Physical Therapy Treatment  Patient Details  Name: Ashley Moore MRN: [id number] Date of Birth: [birth year] Referring Provider (PT): Dr. Joylene John   Encounter Date: 08/08/2019  PT End of Session - 08/08/19 1612    Visit Number  4    Date for PT Re-Evaluation  08/29/19    Authorization Type  [employer]    PT Start Time  [id number]    PT Stop Time  [id number]    PT Time Calculation (min)  44 min    Activity Tolerance  Patient tolerated treatment well;No increased pain    Behavior During Therapy  WFL for tasks assessed/performed       Past Medical History:  Diagnosis Date  . Bowen's disease    excised 1992  . Family history of breast cancer   . Family history of lung cancer   . Family history of non-Hodgkin's lymphoma   . Family history of ovarian cancer   . Headache   . Hx of varicella   . 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([place])   . Thyroid cancer [facility])     Past Surgical History:  Procedure Laterality Date  . BUNIONECTOMY Right   . IR GENERIC HISTORICAL  11/14/2016   IR US GUIDE VASC ACCESS RIGHT 11/14/2016 [facility]  . IR GENERIC HISTORICAL  11/14/2016   IR FLUORO GUIDE CV LINE RIGHT 11/14/2016 [facility]  . IR GENERIC HISTORICAL  12/12/2016   IR US GUIDE VASC ACCESS RIGHT 12/12/2016 [name], MD [facility]  . IR GENERIC HISTORICAL  12/12/2016   IR FLUORO GUIDE CV LINE RIGHT 12/12/2016 [name], MD [facility]  . SKIN CANCER EXCISION     bowens disease    There were no vitals filed for this visit.  Subjective Assessment - 08/08/19 1532    Subjective  I used the biggest dilator and I have ripped myself at the perineal body. I feel my incontinence is better due to chewing gum. I have stopped chewing gum and a lot less leakage.    Patient Stated Goals  reduce pain, expanding the vaginal canal    Currently in Pain?  No/denies                     Pelvic Floor Special Questions - 08/08/19 0001    External Perineal Exam  tissue is having better color and becoming softer, area where she split looks good    Pelvic Floor Internal Exam  Patient confirms identification and approves PT to assess the pelvic floor and treatment    Exam Type  Vaginal        [facility] Adult PT Treatment/Exercise - 08/08/19 0001      Manual Therapy   Manual Therapy  Soft tissue mobilization;Internal Pelvic Floor    Soft tissue mobilization  gentle soft tissue work to the labia, inner thigh, hip adductor insertion, gluteal area, perineal body, mons pubis   initially tips of 2 fingers and after full two finger in   Internal Pelvic Floor  along the levator ani, sides of the bladder, obturator internist  and introitus to expand the tissue               PT Short Term Goals - 08/01/19 1706      PT SHORT TERM GOAL #1   Title  independent with initial HEP    Time  4  Period  Weeks    Status  Achieved    Target Date  08/01/19      PT SHORT TERM GOAL #2   Title  understand how to use the dilators to work on perineal soft tissue work with pain level </= 5/10    Time  4    Period  Weeks    Status  Achieved    Target Date  08/01/19      PT SHORT TERM GOAL #3   Title  understand how to do self perineal massage to separate the labia minora    Time  4    Period  Weeks    Status  Achieved      PT SHORT TERM GOAL #4   Title  fecal leakage decreased >/= 25% due to improved pelvic floor muscle control    Time  4    Period  Weeks    Status  Achieved        PT Long Term Goals - 08/08/19 1616      PT LONG TERM GOAL #1   Title  independent with HEP    Baseline  still learning    Time  8    Period  Weeks    Status  On-going      PT LONG TERM GOAL #2   Title  ability to use the largest size dilator with no pain >/= 3/10and understand she will have to use it for 2 years    Baseline  betwee second to third size     Time  8    Period  Weeks    Status  On-going      PT LONG TERM GOAL #3   Title  urine stream able to go straight and not have to clean herself due to spraying of urine    Time  8    Period  Weeks    Status  On-going      PT LONG TERM GOAL #4   Title  fecal incontinence decreased >/= 75% due to increase control of pelvic floor muscles    Time  8    Period  Weeks    Status  On-going            Plan - 08/08/19 1613    Clinical Impression Statement  Patient was able to use the large dilator but split by the posterior fourchette. The area looks healed. Patient skin felt softer after the manual work and the levator ani. Patient is doing the soft issue work at home. Patient will benefit from skilled therapy to elongation of the tissue to increase the dilation of the vaginal canal.    Personal Factors and Comorbidities  Comorbidity 1;Time since onset of injury/illness/exacerbation;Age    Comorbidities  anal cancer with radiation, thyroid cancer    Examination-Activity Limitations  Toileting;Continence    Examination-Participation Restrictions  Interpersonal Relationship    Stability/Clinical Decision Making  Evolving/Moderate complexity    Rehab Potential  Good    PT Frequency  1x / week    PT Duration  8 weeks    PT Treatment/Interventions  Biofeedback;Therapeutic activities;Therapeutic exercise;Neuromuscular re-education;Patient/family education;Dry needling;Scar mobilization;Manual techniques    PT Next Visit Plan  work on vagina canal to open, soft tissue work to the perineal area; go over stretches and bulging of the pelvic floor    Consulted and Agree with Plan of Care  Patient       Patient will benefit from skilled therapeutic intervention in order  to improve the following deficits and impairments:  Decreased coordination, Increased fascial restricitons, Pain, Decreased strength  Visit Diagnosis: Muscle weakness (generalized)  Cramp and spasm  Unspecified lack of  coordination  Aftercare following surgery for neoplasm     Problem List Patient Active Problem List   Diagnosis Date Noted  . Family history of breast cancer   . Family history of lung cancer   . Family history of ovarian cancer   . Family history of non-Hodgkin's lymphoma   . 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 Port catheter in place 11/21/2016  . 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 Anal cancer ([place]) 11/03/2016  . Cystitis 05/06/2015  . Degenerative cervical disc 04/08/2015  . Trigger point of neck 03/26/2015  . Pain in joint, ankle and foot 04/23/2013  . Visit for preventive health examination 02/13/2013  . Routine gynecological examination 02/13/2013  . Family history of breast cancer in first degree relative 02/13/2013  . Rash and nonspecific skin eruption 02/13/2013  . 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 VITAMIN D DEFICIENCY 06/25/2008  . NECK PAIN 06/02/2008  . FOOT PAIN 06/02/2008  . OSTEOPENIA 06/02/2008    Earlie Counts, PT 08/08/19 4:17 PM   [facility] [address], [place], [place], [address] Phone: [phone]   Fax:  [phone]  Name: NHIA MCNICHOL MRN: [id number] Date of Birth: 12/18/1950

## 2019-08-22 ENCOUNTER — Encounter: Payer: Self-pay | Admitting: Physical Therapy

## 2019-08-22 ENCOUNTER — Ambulatory Visit: Payer: Medicare HMO | Attending: Gynecologic Oncology | Admitting: Physical Therapy

## 2019-08-22 ENCOUNTER — Other Ambulatory Visit: Payer: Self-pay

## 2019-08-22 DIAGNOSIS — R252 Cramp and spasm: Secondary | ICD-10-CM | POA: Insufficient documentation

## 2019-08-22 DIAGNOSIS — M6281 Muscle weakness (generalized): Secondary | ICD-10-CM | POA: Diagnosis not present

## 2019-08-22 DIAGNOSIS — R279 Unspecified lack of coordination: Secondary | ICD-10-CM | POA: Diagnosis not present

## 2019-08-22 DIAGNOSIS — Z483 Aftercare following surgery for neoplasm: Secondary | ICD-10-CM | POA: Diagnosis not present

## 2019-08-22 NOTE — Therapy (Signed)
[facility] [address] [place], [place], [address] Phone: [phone]   Fax:  [phone]  Physical Therapy Treatment  Patient Details  Name: Ashley Moore MRN: [id number] Date of Birth: January 11, 1951 Referring Provider (PT): Dr. Joylene John   Encounter Date: 08/22/2019  PT End of Session - 08/22/19 1612    Visit Number  5    Date for PT Re-Evaluation  09/23/19    Authorization Type  [employer]    PT Start Time  [id number]    PT Stop Time  [id number]    PT Time Calculation (min)  45 min    Activity Tolerance  Patient tolerated treatment well;No increased pain    Behavior During Therapy  WFL for tasks assessed/performed       Past Medical History:  Diagnosis Date  . Bowen's disease    excised 1992  . Family history of breast cancer   . Family history of lung cancer   . Family history of non-Hodgkin's lymphoma   . Family history of ovarian cancer   . Headache   . Hx of varicella   . 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([place])   . Thyroid cancer [facility])     Past Surgical History:  Procedure Laterality Date  . BUNIONECTOMY Right   . IR GENERIC HISTORICAL  11/14/2016   IR US GUIDE VASC ACCESS RIGHT 11/14/2016 [facility]  . IR GENERIC HISTORICAL  11/14/2016   IR FLUORO GUIDE CV LINE RIGHT 11/14/2016 [facility]  . IR GENERIC HISTORICAL  12/12/2016   IR US GUIDE VASC ACCESS RIGHT 12/12/2016 [name], MD [facility]  . IR GENERIC HISTORICAL  12/12/2016   IR FLUORO GUIDE CV LINE RIGHT 12/12/2016 [name], MD [facility]  . SKIN CANCER EXCISION     bowens disease    There were no vitals filed for this visit.  Subjective Assessment - 08/22/19 1534    Subjective  I have not been very aggressive. The dilator stays right where the scar is. I am doing the massaging. The beaded area is getting softer. I see Dr. Quentin Cornwall next week who is the colon oncologist.    Patient Stated Goals  reduce pain, expanding the vaginal canal    Currently in  Pain?  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 Joylene John    Onset Date/Surgical Date  07/03/18    Prior Therapy  yes      Precautions   Precautions  Other (comment)    Precaution Comments  cancer with radiation for anal cancer      Restrictions   Weight Bearing Restrictions  No      [place]      Prior Function   Level of Independence  Independent    Vocation  Self employed      Cognition   Overall Cognitive Status  Within Functional Limits for tasks assessed      Posture/Postural Control   Posture/Postural Control  No significant limitations      AROM   Lumbar Extension  decreased by 25%                Pelvic Floor Special Questions - 08/22/19 0001    External  Perineal Exam  tissue is having better color and becoming softer, area where she split looks good    Pelvic Floor Internal Exam  Patient confirms identification and approves PT to assess the pelvic floor and treatment    Exam Type  Vaginal    Strength  fair squeeze, definite lift        [facility] Adult PT Treatment/Exercise - 08/22/19 0001      Self-Care   Self-Care  Other Self-Care Comments      Manual Therapy   Manual Therapy  Soft tissue mobilization;Internal Pelvic Floor    Soft tissue mobilization  gentle soft tissue work to the labia, inner thigh, hip adductor insertion, gluteal area, perineal body, mons pubis    Internal Pelvic Floor  along the levator ani, sides of the bladder, obturator internist  and introitus to expand the tissue; used one finger to 2 fingers width 3 inches in wit rotation of the fingers to loosen uup the band of tissue             PT Education - 08/22/19 1610    Education Details  how to measure how deep the dilator goes and keep a log     Person(s) Educated  Patient    Methods  Explanation    Comprehension  Verbalized understanding       PT Short Term Goals - 08/01/19 1706      PT SHORT TERM GOAL #1   Title  independent with initial HEP    Time  4    Period  Weeks    Status  Achieved    Target Date  08/01/19      PT SHORT TERM GOAL #2   Title  understand how to use the dilators to work on perineal soft tissue work with pain level </= 5/10    Time  4    Period  Weeks    Status  Achieved    Target Date  08/01/19      PT SHORT TERM GOAL #3   Title  understand how to do self perineal massage to separate the labia minora    Time  4    Period  Weeks    Status  Achieved      PT SHORT TERM GOAL #4   Title  fecal leakage decreased >/= 25% due to improved pelvic floor muscle control    Time  4    Period  Weeks    Status  Achieved        PT Long Term Goals - 08/22/19 1707      PT LONG TERM GOAL #1   Title  independent with HEP    Baseline  still learning    Time  8    Period  Weeks    Status  On-going      PT LONG TERM GOAL #2   Title  ability to use the largest size dilator with no pain >/= 3/10and understand she will have to use it for 2 years    Baseline  betwee second to third size    Time  8    Status  On-going      PT LONG TERM GOAL #3   Title  urine stream able to go straight and not have to clean herself due to spraying of urine    Time  8    Period  Weeks    Status  On-going      PT LONG TERM GOAL #4  Title  fecal incontinence decreased >/= 75% due to increase control of pelvic floor muscles    Baseline  improved by 75% better and leak one time every 2 weeks; had it today due to eating something that irritated her    Time  8    Period  Weeks    Status  On-going            Plan - 08/22/19 1614    Clinical Impression Statement  Patient is on the fourth dilator but only gets it in a small amount. Patient tore some scar tissue 1 week ago so she has gone slowly with the dilator.  Patient will start to measure how much she puts the dilator in to keep track since it is small increments. Today therapist was able to put two fingers width in the vaginal canal up to 3 inches and rotate the fingers after 25 minutes of soft tissue work in the vaginal canal. Patient had some pain at level 2/10 but that resolved. Patient will benefit from skilled therapy to assist in direction of using the dilator and the manual work to soften the tissue and improve dilation of the vaginal canal.    Personal Factors and Comorbidities  Comorbidity 1;Time since onset of injury/illness/exacerbation;Age    Comorbidities  anal cancer with radiation, thyroid cancer    Examination-Activity Limitations  Toileting;Continence    Examination-Participation Restrictions  Interpersonal Relationship    Stability/Clinical Decision Making  Evolving/Moderate complexity    Rehab Potential  Good    PT Frequency  1x / week    PT Duration  8 weeks    PT Treatment/Interventions  Biofeedback;Therapeutic activities;Therapeutic exercise;Neuromuscular re-education;Patient/family education;Dry needling;Scar mobilization;Manual techniques    PT Next Visit Plan  work on vagina canal to open, soft tissue work to the perineal area; go over stretches and bulging of the pelvic floor    Recommended Other Services  renewal sent on 10/11/18    Consulted and Agree with Plan of Care  Patient       Patient will benefit from skilled therapeutic intervention in order to improve the following deficits and impairments:  Decreased coordination, Increased fascial restricitons, Pain, Decreased strength  Visit Diagnosis: Muscle weakness (generalized)  Cramp and spasm  Unspecified lack of coordination  Aftercare following surgery for neoplasm     Problem List Patient Active Problem List   Diagnosis Date Noted  . Family history of breast cancer   . Family history of lung cancer   . Family history of ovarian cancer   . Family history  of non-Hodgkin's lymphoma   . 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 Port catheter in place 11/21/2016  . 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 Anal cancer ([place]) 11/03/2016  . Cystitis 05/06/2015  . Degenerative cervical disc 04/08/2015  . Trigger point of neck 03/26/2015  . Pain in joint, ankle and foot 04/23/2013  . Visit for preventive health examination 02/13/2013  . Routine gynecological examination 02/13/2013  . Family history of breast cancer in first degree relative 02/13/2013  . Rash and nonspecific skin eruption 02/13/2013  . 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 VITAMIN D DEFICIENCY 06/25/2008  . NECK PAIN 06/02/2008  . FOOT PAIN 06/02/2008  . OSTEOPENIA 06/02/2008    Earlie Counts, PT 08/22/19 5:08 PM   [facility] [address], [place], [place], [address] Phone: [phone]   Fax:  [phone]  Name: Ashley Moore MRN: [id number] Date of Birth: 1951/06/12

## 2019-08-22 NOTE — Addendum Note (Signed)
Addended by: Earlie Counts F on: 08/22/2019 05:10 PM   Modules accepted: Orders

## 2019-09-02 ENCOUNTER — Encounter: Payer: Self-pay | Admitting: Licensed Clinical Social Worker

## 2019-09-03 ENCOUNTER — Ambulatory Visit: Payer: Medicare HMO

## 2019-09-05 ENCOUNTER — Other Ambulatory Visit: Payer: Self-pay

## 2019-09-05 ENCOUNTER — Encounter: Payer: Self-pay | Admitting: Physical Therapy

## 2019-09-05 ENCOUNTER — Ambulatory Visit: Payer: Medicare HMO | Admitting: Physical Therapy

## 2019-09-05 DIAGNOSIS — R252 Cramp and spasm: Secondary | ICD-10-CM

## 2019-09-05 DIAGNOSIS — M6281 Muscle weakness (generalized): Secondary | ICD-10-CM | POA: Diagnosis not present

## 2019-09-05 DIAGNOSIS — R279 Unspecified lack of coordination: Secondary | ICD-10-CM

## 2019-09-05 DIAGNOSIS — Z483 Aftercare following surgery for neoplasm: Secondary | ICD-10-CM | POA: Diagnosis not present

## 2019-09-06 NOTE — Therapy (Signed)
[facility] [address], [place], [place], [address] Phone: [phone]   Fax:  [phone]  Physical Therapy Treatment  Patient Details  Name: Ashley Moore MRN: [id number] Date of Birth: 1950/10/26 Referring Provider (PT): Dr. Joylene John   Encounter Date: 09/05/2019  PT End of Session - 09/05/19 1527    Visit Number  6    Date for PT Re-Evaluation  09/23/19    Authorization Type  [employer]    PT Start Time  [id number]    PT Stop Time  1608    PT Time Calculation (min)  38 min    Activity Tolerance  Patient tolerated treatment well;No increased pain    Behavior During Therapy  WFL for tasks assessed/performed       Past Medical History:  Diagnosis Date  . Bowen's disease    excised 1992  . Family history of breast cancer   . Family history of lung cancer   . Family history of non-Hodgkin's lymphoma   . Family history of ovarian cancer   . Headache   . Hx of varicella   . 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([place])   . Thyroid cancer [facility])     Past Surgical History:  Procedure Laterality Date  . BUNIONECTOMY Right   . IR GENERIC HISTORICAL  11/14/2016   IR US GUIDE VASC ACCESS RIGHT 11/14/2016 [facility]  . IR GENERIC HISTORICAL  11/14/2016   IR FLUORO GUIDE CV LINE RIGHT 11/14/2016 [facility]  . IR GENERIC HISTORICAL  12/12/2016   IR US GUIDE VASC ACCESS RIGHT 12/12/2016 [name], MD [facility]  . IR GENERIC HISTORICAL  12/12/2016   IR FLUORO GUIDE CV LINE RIGHT 12/12/2016 [name], MD [facility]  . SKIN CANCER EXCISION     bowens disease    There were no vitals filed for this visit.  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 I got the larger size in 2 times. I took a few days off so now I am not able to past the scar.    Patient Stated Goals  reduce pain, expanding the vaginal canal    Currently in Pain?   No/denies                       [facility] Adult PT Treatment/Exercise - 09/06/19 0001      Manual Therapy   Manual Therapy  Internal Pelvic Floor    Internal Pelvic Floor  along the levator ani, sides of the bladder, obturator internist  and introitus to expand the tissue; used one finger to 2 fingers width 3 inches in wit rotation of the fingers to loosen uup the band of tissue               PT Short Term Goals - 08/01/19 1706      PT SHORT TERM GOAL #1   Title  independent with initial HEP    Time  4    Period  Weeks    Status  Achieved    Target Date  08/01/19      PT SHORT TERM GOAL #2   Title  understand how to use the dilators to work on perineal soft tissue work with pain level </= 5/10    Time  4    Period  Weeks    Status  Achieved    Target Date  08/01/19      PT SHORT TERM GOAL #3   Title  understand how to do self perineal massage to separate the labia minora    Time  4    Period  Weeks    Status  Achieved      PT SHORT TERM GOAL #4   Title  fecal leakage decreased >/= 25% due to improved pelvic floor muscle control    Time  4    Period  Weeks    Status  Achieved        PT Long Term Goals - 09/05/19 0750      PT LONG TERM GOAL #1   Title  independent with HEP    Baseline  still learning    Time  8    Period  Weeks    Status  On-going      PT LONG TERM GOAL #2   Title  ability to use the largest size dilator with no pain >/= 3/10and understand she will have to use it for 2 years    Baseline  betwee second to third size    Time  8    Period  Weeks    Status  On-going      PT LONG TERM GOAL #3   Title  urine stream able to go straight and not have to clean herself due to spraying of urine    Time  8    Period  Weeks    Status  On-going      PT LONG TERM GOAL #4   Title  fecal incontinence decreased >/= 75% due to increase control of pelvic floor muscles    Baseline  improved by 75% better and leak one time every 2 weeks;  had it today due to eating something that irritated her    Period  Weeks    Status  On-going            Plan - 09/05/19 0747    Clinical Impression Statement  Physical therapist was able to place 2 fingers into the vaginal canal to 3inches deep after manual work. Physical therapist did not feel the band from last visit. Patient introitus was tight causing restrictions from the therapist to place her fingers further. Patient will resume using the dilators today. Patient will benfit from skilled therapy to assis in cirection fo using the dilator and the manual work to soften the tissue and improve dilation of the vaginal canal.    Personal Factors and Comorbidities  Comorbidity 1;Time since onset of injury/illness/exacerbation;Age    Comorbidities  anal cancer with radiation, thyroid cancer    Examination-Activity Limitations  Toileting;Continence    Examination-Participation Restrictions  Interpersonal Relationship    Stability/Clinical Decision Making  Evolving/Moderate complexity    Rehab Potential  Good    PT Frequency  1x / week    PT Duration  8 weeks    PT Treatment/Interventions  Biofeedback;Therapeutic activities;Therapeutic exercise;Neuromuscular re-education;Patient/family education;Dry needling;Scar mobilization;Manual techniques    PT Next Visit Plan  work on vagina canal to open, soft tissue work to the perineal area; go over stretches and bulging of the pelvic floor; renewal note    Consulted and Agree with Plan of Care  Patient       Patient will benefit from skilled therapeutic intervention in order to improve the following deficits and impairments:  Decreased coordination, Increased fascial restricitons, Pain, Decreased strength  Visit Diagnosis: Muscle weakness (generalized)  Cramp and spasm  Unspecified lack of coordination  Aftercare following surgery for neoplasm     Problem List Patient Active Problem List   Diagnosis Date Noted  . Family history of  breast cancer   . Family history of lung cancer   . Family history of ovarian cancer   . Family history of non-Hodgkin's lymphoma   . 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 Port catheter in place 11/21/2016  . 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 Anal cancer ([place]) 11/03/2016  . Cystitis 05/06/2015  . Degenerative cervical disc 04/08/2015  . Trigger point of neck 03/26/2015  . Pain in joint, ankle and foot 04/23/2013  . Visit for preventive health examination 02/13/2013  . Routine gynecological examination 02/13/2013  . Family history of breast cancer in first degree relative 02/13/2013  . Rash and nonspecific skin eruption 02/13/2013  . 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 VITAMIN D DEFICIENCY 06/25/2008  . NECK PAIN 06/02/2008  . FOOT PAIN 06/02/2008  . OSTEOPENIA 06/02/2008    Earlie Counts, PT 09/06/19 7:51 AM   [facility] [address] [place], [place], [address] Phone: [phone]   Fax:  [phone]  Name: Ashley Moore MRN: [id number] Date of Birth: [birth year]

## 2019-09-11 ENCOUNTER — Other Ambulatory Visit: Payer: Self-pay | Admitting: Internal Medicine

## 2019-09-11 DIAGNOSIS — Z1231 Encounter for screening mammogram for malignant neoplasm of breast: Secondary | ICD-10-CM

## 2019-09-17 DIAGNOSIS — C210 Malignant neoplasm of anus, unspecified: Secondary | ICD-10-CM | POA: Diagnosis not present

## 2019-09-17 DIAGNOSIS — R350 Frequency of micturition: Secondary | ICD-10-CM | POA: Diagnosis not present

## 2019-09-17 DIAGNOSIS — N3281 Overactive bladder: Secondary | ICD-10-CM | POA: Diagnosis not present

## 2019-09-17 DIAGNOSIS — R3915 Urgency of urination: Secondary | ICD-10-CM | POA: Diagnosis not present

## 2019-09-17 DIAGNOSIS — Q525 Fusion of labia: Secondary | ICD-10-CM | POA: Diagnosis not present

## 2019-09-18 ENCOUNTER — Ambulatory Visit: Payer: Medicare HMO | Admitting: Physical Therapy

## 2019-09-18 ENCOUNTER — Telehealth: Payer: Self-pay | Admitting: Physical Therapy

## 2019-09-18 NOTE — Telephone Encounter (Signed)
Called patient about her 3:30 PM appointment and left a message.  Earlie Counts, PT @12 /30/2020@ 3:54 PM

## 2019-09-25 ENCOUNTER — Other Ambulatory Visit: Payer: Self-pay

## 2019-09-25 ENCOUNTER — Encounter: Payer: Self-pay | Admitting: Physical Therapy

## 2019-09-25 ENCOUNTER — Ambulatory Visit: Payer: Medicare HMO | Attending: Gynecologic Oncology | Admitting: Physical Therapy

## 2019-09-25 ENCOUNTER — Ambulatory Visit (INDEPENDENT_AMBULATORY_CARE_PROVIDER_SITE_OTHER): Payer: Medicare HMO

## 2019-09-25 VITALS — BP 110/72 | Temp 97.7°F | Ht 62.0 in | Wt 189.6 lb

## 2019-09-25 DIAGNOSIS — M6281 Muscle weakness (generalized): Secondary | ICD-10-CM | POA: Insufficient documentation

## 2019-09-25 DIAGNOSIS — Z0000 Encounter for general adult medical examination without abnormal findings: Secondary | ICD-10-CM | POA: Diagnosis not present

## 2019-09-25 DIAGNOSIS — Z483 Aftercare following surgery for neoplasm: Secondary | ICD-10-CM | POA: Diagnosis not present

## 2019-09-25 DIAGNOSIS — R252 Cramp and spasm: Secondary | ICD-10-CM | POA: Insufficient documentation

## 2019-09-25 DIAGNOSIS — R279 Unspecified lack of coordination: Secondary | ICD-10-CM | POA: Diagnosis not present

## 2019-09-25 NOTE — Progress Notes (Addendum)
Evaluation noted reviewed and agree. Shanon Ace, MD     Subjective:   Ashley Moore is a 69 y.o. female who presents for Medicare Annual (Subsequent) preventive examination.  Review of Systems:   Cardiac Risk Factors include: advanced age (>20men, >12 women)    Objective:     Vitals: BP 110/72 (BP Location: Left Arm, Patient Position: Sitting)   Temp 97.7 F (36.5 C) (Temporal)   Ht 5\' 2"  (1.575 m)   Wt 189 lb 9.6 oz (86 kg)   BMI 34.68 kg/m   Body mass index is 34.68 kg/m.  Advanced Directives 09/25/2019 07/04/2019 06/18/2019 10/24/2017 06/29/2017 03/01/2017 02/14/2017  Does Patient Have a Medical Advance Directive? Yes No No Yes Yes No No  Type of Academic librarian - - Healthcare Power of [place];Living will - - -  Does patient want to make changes to medical advance directive? No - Patient declined - - - - - -  Copy of [place] in Chart? No - copy requested - - - - - -  Would patient like information on creating a medical advance directive? - No - Patient declined - - - No - Patient declined -    Tobacco Social History   Tobacco Use  Smoking Status Former Smoker  Smokeless Tobacco Former Systems developer  . Quit date: 09/20/1995     Counseling given: Not Answered   Clinical Intake:  Pre-visit preparation completed: Yes  Pain : No/denies pain     BMI - recorded: 34.68 Nutritional Status: BMI > 30  Obese Diabetes: No  How often do you need to have someone help you when you read instructions, pamphlets, or other written materials from your doctor or pharmacy?: 1 - Never What is the last grade level you completed in school?: 2 years college  Interpreter Needed?: No  Information entered by :: Franne Forts, LPN.  Past Medical History:  Diagnosis Date  . Bowen's disease    excised 1992  . Family history of breast cancer   . Family history of lung cancer   . Family history of non-Hodgkin's lymphoma   . Family  history of ovarian cancer   . Headache   . Hx of varicella   . 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([place])   . Thyroid cancer [facility])    Past Surgical History:  Procedure Laterality Date  . BUNIONECTOMY Right   . IR GENERIC HISTORICAL  11/14/2016   IR US GUIDE VASC ACCESS RIGHT 11/14/2016 [facility]  . IR GENERIC HISTORICAL  11/14/2016   IR FLUORO GUIDE CV LINE RIGHT 11/14/2016 [facility]  . IR GENERIC HISTORICAL  12/12/2016   IR US GUIDE VASC ACCESS RIGHT 12/12/2016 [name], MD [facility]  . IR GENERIC HISTORICAL  12/12/2016   IR FLUORO GUIDE CV LINE RIGHT 12/12/2016 [name], MD [facility]  . SKIN CANCER EXCISION     bowens disease   Family History  Problem Relation Age of Onset  . Lung cancer Mother        lung  . Breast cancer Mother 75  . Ovarian cancer Mother 1  . Pancreatitis Father   . Breast cancer Sister 29  . Breast cancer Maternal Grandmother 46       breast   . Breast cancer Maternal Aunt 75       breast  . Melanoma Sister   . Breast cancer Sister 93  . Non-Hodgkin's lymphoma Paternal Grandmother    Social History   Socioeconomic  History  . Marital status: Widowed    Spouse name: Not on file  . Number of children: 0  . Years of education: 52  . Highest education level: Associate degree: academic program  Occupational History  . 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 Smoking status: Former Research scientist (life sciences)  . Smokeless tobacco: Former Systems developer    Quit date: 09/20/1995  Substance and Sexual Activity  . Alcohol use: Yes    Alcohol/week: 0.0 standard drinks    Comment: occasional  . Drug use: Yes    Types: Marijuana  . Sexual activity: Never  Other Topics Concern  . Not on file  Social History Narrative   H H  of 1      5 pets.   She is a former smoker   Retired Programmer, applications; Conservator, museum/gallery   [employer]   Red wine  1 per night.    Tea green tea and earl gray    Moved from [place] to  [place] area in 12/21/1983   1 pregnancy   Husband died spring  2016 cv   Sister died 03/23/15  Bone cancer    3 remaining sisters            Social Determinants of Health   Financial Resource Strain: Low Risk   . Difficulty of Paying Living Expenses: Not very hard  Food Insecurity: No Food Insecurity  . Worried About Charity fundraiser in the Last Year: Never true  . Ran Out of Food in the Last Year: Never true  Transportation Needs: No Transportation Needs  . Lack of Transportation (Medical): No  . Lack of Transportation (Non-Medical): No  Physical Activity: Insufficiently Active  . Days of Exercise per Week: 4 days  . Minutes of Exercise per Session: 30 min  Stress: Stress Concern Present  . Feeling of Stress : To some extent  Social Connections: Somewhat Isolated  . Frequency of Communication with Friends and Family: More than three times a week  . Frequency of Social Gatherings with Friends and Family: Not on file  . Attends Religious Services: Never  . Active Member of Clubs or Organizations: Yes  . Attends Archivist Meetings: Never  . Marital Status: Widowed    Outpatient Encounter Medications as of 09/25/2019  Medication Sig  . cholecalciferol (VITAMIN D3) 25 MCG (1000 UT) tablet Take 5,000 Units by mouth daily.  Marland Kitchen conjugated estrogens (PREMARIN) vaginal cream Place 0.5 Applicatorfuls vaginally at bedtime.   . meloxicam (MOBIC) 15 MG tablet TAKE 1 TABLET EVERY DAY  . Multiple Vitamin (MULTIVITAMIN) tablet Take 1 tablet by mouth daily.  . solifenacin (VESICARE) 5 MG tablet Take by mouth daily.   Marland Kitchen tiZANidine (ZANAFLEX) 4 MG tablet TAKE 1 TABLET EVERY 8 HOURS AS NEEDED FOR MUSCLE SPASM(S) (Patient taking differently: 6 mg. )  . Turmeric 500 MG CAPS Take 1 capsule by mouth daily.   No facility-administered encounter medications on file as of 09/25/2019.    Activities of Daily Living In your present state of health, do you have any difficulty performing the  following activities: 09/25/2019  Hearing? N  Vision? N  Difficulty concentrating or making decisions? N  Walking or climbing stairs? N  Dressing or bathing? N  Doing errands, shopping? N  Preparing Food and eating ? N  Using the Toilet? N  In the past six months, have you accidently leaked  urine? Y  Do you have problems with loss of bowel control? N  Managing your Medications? N  Managing your Finances? N  Housekeeping or managing your Housekeeping? N  Some recent data might be hidden    Patient Care Team: Panosh, Standley Brooking, MD as PCP - General Inda Castle, MD (Inactive) as Attending Physician (Gastroenterology) [name], MD as Consulting Physician (Oncology) Kyung Rudd, MD as Consulting Physician (Radiation Oncology) Pieter Partridge, DO as Consulting Physician (Neurology)    Assessment:   This is a routine wellness examination for Ashley Moore.  Exercise Activities and Dietary recommendations Current Exercise Habits: Home exercise routine, Type of exercise: stretching;strength training/weights, Time (Minutes): 30, Frequency (Times/Week): 4, Weekly Exercise (Minutes/Week): 120, Intensity: Moderate, Exercise limited by: None identified  Goals    . Exercise 150 minutes per week (moderate activity)     Continue with aggressive exercise x 5 days a week   Start 30 minutes;        Fall Risk Fall Risk  09/25/2019 10/09/2018 10/24/2017 10/05/2017 06/29/2017  Falls in the past year? 0 0 Yes Yes No  Number falls in past yr: - - 2 or more 2 or more -  Injury with Fall? - - Yes - -  Comment - - Right wrist sprain in December - -  Risk Factor Category  - - - High Fall Risk -  Risk for fall due to : Medication side effect - - - -  Follow up Falls evaluation completed;Education provided;Falls prevention discussed Falls evaluation completed Falls evaluation completed - -   Is the patient's home free of loose throw rugs in walkways, pet beds, electrical cords, etc?   yes      Grab  bars in the bathroom? yes      Handrails on the stairs?   yes      Adequate lighting?   yes  Timed Get Up and Go performed: normal  Depression Screen PHQ 2/9 Scores 09/25/2019 10/24/2017 06/29/2017 02/14/2017  PHQ - 2 Score 1 0 0 1     Cognitive Function     6CIT Screen 09/25/2019  What Year? 0 points  What month? 0 points  What time? 0 points  Count back from 20 0 points  Months in reverse 0 points  Repeat phrase 0 points  Total Score 0    Immunization History  Administered Date(s) Administered  . Fluad Quad(high Dose 65+) 05/23/2019  . Influenza Whole 06/25/2008, 08/10/2010  . Influenza, High Dose Seasonal PF 06/11/2015, 10/27/2016, 06/21/2017  . Influenza,inj,Quad PF,6+ Mos 06/28/2018  . Pneumococcal Conjugate-13 03/11/2015  . Pneumococcal Polysaccharide-23 04/29/2016  . Tdap 02/13/2013  . Zoster 02/13/2013    Qualifies for Shingles Vaccine?Yes and she will inquire at pharmacy for out of pocket cost.  Screening Tests Health Maintenance  Topic Date Due  . MAMMOGRAM  10/10/2020  . COLONOSCOPY  11/05/2020  . TETANUS/TDAP  02/14/2023  . INFLUENZA VACCINE  Completed  . DEXA SCAN  Completed  . Hepatitis C Screening  Completed  . PNA vac Low Risk Adult  Completed    Cancer Screenings: Lung: Low Dose CT Chest recommended if Age 40-80 years, 30 pack-year currently smoking OR have quit w/in 15years. Patient does not qualify. Breast:  Up to date on Mammogram? Yes   Up to date of Bone Density/Dexa? Yes Colorectal: yes  Additional Screenings:  Hepatitis C Screening: completed 11/07/2017.     Plan:   She will continue to remain active and make healthy food  choices.    I have personally reviewed and noted the following in the patient's chart:   . Medical and social history . Use of alcohol, tobacco or illicit drugs  . Current medications and supplements . Functional ability and status . Nutritional status . Physical activity . Advanced directives . List of other  physicians . Hospitalizations, surgeries, and ER visits in previous 12 months . Vitals . Screenings to include cognitive, depression, and falls . Referrals and appointments  In addition, I have reviewed and discussed with patient certain preventive protocols, quality metrics, and best practice recommendations. A written personalized care plan for preventive services as well as general preventive health recommendations were provided to patient.     Franne Forts, LPN  [id number]   Above noted reviewed and agree. Shanon Ace, MD

## 2019-09-25 NOTE — Patient Instructions (Signed)
[name] , Thank you for taking time to come for your Medicare Wellness Visit. I appreciate your ongoing commitment to your health goals. Please review the following plan we discussed and let me know if I can assist you in the future.   Screening recommendations/referrals: Colorectal Screening: completed 11/05/2010; due again 11/06/2020. Mammogram: performed 10/10/2018. Scheduled 10/30/19.  Bone Density: performed 08/06/2018; currently up to date.   Vision and Dental Exams: Recommended annual ophthalmology exams for early detection of glaucoma and other disorders of the eye. She states her last routine eye exam was in May 2020. Recommended annual dental exams for proper oral hygiene. She reports seeing the dentist twice yearly.  Diabetic Exams: Diabetic Eye Exam: N/A Diabetic Foot Exam: N/A  Vaccinations: Influenza vaccine: Performed 05/23/2019; due again Fall 2021. Pneumococcal vaccine: performed 03/11/2015 & 04/29/2016; up to date.  Tdap vaccine: performed 02/13/2013; up to date. Shingles vaccine: Please call your pharmacy to determine your out of pocket expense for the Shingrix vaccine. You may receive this vaccine at your local pharmacy. This is a series of two injections to be given 2-6 months apart.  Advanced directives: Advance directives discussed with you today. Once this is complete please bring a copy in to our office so we can scan it into your chart.  Goals:Continue to drink at least 6-8 8oz glasses of water per day and to exercise for at least 150 minutes per week.  Next appointment: Please schedule your Annual Wellness Visit with your Nurse Health Advisor in one year.  Preventive Care 71 Years and Older, Female Preventive care refers to lifestyle choices and visits with your health care provider that can promote health and wellness. What does preventive care include?  A yearly physical exam. This is also called an annual well check.  Dental exams once or twice a  year.  Routine eye exams. Ask your health care provider how often you should have your eyes checked.  Personal lifestyle choices, including:  Daily care of your teeth and gums.  Regular physical activity.  Eating a healthy diet.  Avoiding tobacco and drug use.  Limiting alcohol use.  Practicing safe sex.  Taking low-dose aspirin every day if recommended by your health care provider.  Taking vitamin and mineral supplements as recommended by your health care provider. What happens during an annual well check? The services and screenings done by your health care provider during your annual well check will depend on your age, overall health, lifestyle risk factors, and family history of disease. Counseling  Your health care provider may ask you questions about your:  Alcohol use.  Tobacco use.  Drug use.  Emotional well-being.  Home and relationship well-being.  Sexual activity.  Eating habits.  History of falls.  Memory and ability to understand (cognition).  Work and work Statistician.  Reproductive health. Screening  You may have the following tests or measurements:  Height, weight, and BMI.  Blood pressure.  Lipid and cholesterol levels. These may be checked every 5 years, or more frequently if you are over 58 years old.  Skin check.  Lung cancer screening. You may have this screening every year starting at age 64 if you have a 30-pack-year history of smoking and currently smoke or have quit within the past 15 years.  Fecal occult blood test (FOBT) of the stool. You may have this test every year starting at age 77.  Flexible sigmoidoscopy or colonoscopy. You may have a sigmoidoscopy every 5 years or a colonoscopy every 10  years starting at age 57.  Hepatitis C blood test.  Hepatitis B blood test.  Sexually transmitted disease (STD) testing.  Diabetes screening. This is done by checking your blood sugar (glucose) after you have not eaten for a while  (fasting). You may have this done every 1-3 years.  Bone density scan. This is done to screen for osteoporosis. You may have this done starting at age 61.  Mammogram. This may be done every 1-2 years. Talk to your health care provider about how often you should have regular mammograms. Talk with your health care provider about your test results, treatment options, and if necessary, the need for more tests. Vaccines  Your health care provider may recommend certain vaccines, such as:  Influenza vaccine. This is recommended every year.  Tetanus, diphtheria, and acellular pertussis (Tdap, Td) vaccine. You may need a Td booster every 10 years.  Zoster vaccine. You may need this after age 68.  Pneumococcal 13-valent conjugate (PCV13) vaccine. One dose is recommended after age 3.  Pneumococcal polysaccharide (PPSV23) vaccine. One dose is recommended after age 45. Talk to your health care provider about which screenings and vaccines you need and how often you need them. This information is not intended to replace advice given to you by your health care provider. Make sure you discuss any questions you have with your health care provider. Document Released: 10/02/2015 Document Revised: 05/25/2016 Document Reviewed: 07/07/2015 Elsevier Interactive Patient Education  2017 [place] Prevention in the Home Falls can cause injuries. They can happen to people of all ages. There are many things you can do to make your home safe and to help prevent falls. What can I do on the outside of my home?  Regularly fix the edges of walkways and driveways and fix any cracks.  Remove anything that might make you trip as you walk through a door, such as a raised step or threshold.  Trim any bushes or trees on the path to your home.  Use bright outdoor lighting.  Clear any walking paths of anything that might make someone trip, such as rocks or tools.  Regularly check to see if handrails are loose  or broken. Make sure that both sides of any steps have handrails.  Any raised decks and porches should have guardrails on the edges.  Have any leaves, snow, or ice cleared regularly.  Use sand or salt on walking paths during winter.  Clean up any spills in your garage right away. This includes oil or grease spills. What can I do in the bathroom?  Use night lights.  Install grab bars by the toilet and in the tub and shower. Do not use towel bars as grab bars.  Use non-skid mats or decals in the tub or shower.  If you need to sit down in the shower, use a plastic, non-slip stool.  Keep the floor dry. Clean up any water that spills on the floor as soon as it happens.  Remove soap buildup in the tub or shower regularly.  Attach bath mats securely with double-sided non-slip rug tape.  Do not have throw rugs and other things on the floor that can make you trip. What can I do in the bedroom?  Use night lights.  Make sure that you have a light by your bed that is easy to reach.  Do not use any sheets or blankets that are too big for your bed. They should not hang down onto the floor.  Have  a firm chair that has side arms. You can use this for support while you get dressed.  Do not have throw rugs and other things on the floor that can make you trip. What can I do in the kitchen?  Clean up any spills right away.  Avoid walking on wet floors.  Keep items that you use a lot in easy-to-reach places.  If you need to reach something above you, use a strong step stool that has a grab bar.  Keep electrical cords out of the way.  Do not use floor polish or wax that makes floors slippery. If you must use wax, use non-skid floor wax.  Do not have throw rugs and other things on the floor that can make you trip. What can I do with my stairs?  Do not leave any items on the stairs.  Make sure that there are handrails on both sides of the stairs and use them. Fix handrails that are  broken or loose. Make sure that handrails are as long as the stairways.  Check any carpeting to make sure that it is firmly attached to the stairs. Fix any carpet that is loose or worn.  Avoid having throw rugs at the top or bottom of the stairs. If you do have throw rugs, attach them to the floor with carpet tape.  Make sure that you have a light switch at the top of the stairs and the bottom of the stairs. If you do not have them, ask someone to add them for you. What else can I do to help prevent falls?  Wear shoes that:  Do not have high heels.  Have rubber bottoms.  Are comfortable and fit you well.  Are closed at the toe. Do not wear sandals.  If you use a stepladder:  Make sure that it is fully opened. Do not climb a closed stepladder.  Make sure that both sides of the stepladder are locked into place.  Ask someone to hold it for you, if possible.  Clearly mark and make sure that you can see:  Any grab bars or handrails.  First and last steps.  Where the edge of each step is.  Use tools that help you move around (mobility aids) if they are needed. These include:  Canes.  Walkers.  Scooters.  Crutches.  Turn on the lights when you go into a dark area. Replace any light bulbs as soon as they burn out.  Set up your furniture so you have a clear path. Avoid moving your furniture around.  If any of your floors are uneven, fix them.  If there are any pets around you, be aware of where they are.  Review your medicines with your doctor. Some medicines can make you feel dizzy. This can increase your chance of falling. Ask your doctor what other things that you can do to help prevent falls. This information is not intended to replace advice given to you by your health care provider. Make sure you discuss any questions you have with your health care provider. Document Released: 07/02/2009 Document Revised: 02/11/2016 Document Reviewed: 10/10/2014 Elsevier  Interactive Patient Education  2017 [employer].

## 2019-09-25 NOTE — Therapy (Signed)
[facility] [address] [place], [place], [address] Phone: [phone]   Fax:  [phone]  Physical Therapy Treatment  Patient Details  Name: Ashley Moore MRN: [id number] Date of Birth: [birth year] Referring Provider (PT): Dr. Joylene John   Encounter Date: 09/25/2019  PT End of Session - 09/25/19 1610    Visit Number  7    Date for PT Re-Evaluation  11/20/19    Authorization Type  [employer]    PT Start Time  1400    PT Stop Time  1440    PT Time Calculation (min)  40 min    Activity Tolerance  Patient tolerated treatment well;No increased pain    Behavior During Therapy  WFL for tasks assessed/performed       Past Medical History:  Diagnosis Date  . Bowen's disease    excised 1992  . Family history of breast cancer   . Family history of lung cancer   . Family history of non-Hodgkin's lymphoma   . Family history of ovarian cancer   . Headache   . Hx of varicella   . 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([place])   . Thyroid cancer [facility])     Past Surgical History:  Procedure Laterality Date  . BUNIONECTOMY Right   . IR GENERIC HISTORICAL  11/14/2016   IR US GUIDE VASC ACCESS RIGHT 11/14/2016 [facility]  . IR GENERIC HISTORICAL  11/14/2016   IR FLUORO GUIDE CV LINE RIGHT 11/14/2016 [facility]  . IR GENERIC HISTORICAL  12/12/2016   IR US GUIDE VASC ACCESS RIGHT 12/12/2016 [name], MD [facility]  . IR GENERIC HISTORICAL  12/12/2016   IR FLUORO GUIDE CV LINE RIGHT 12/12/2016 [name], MD [facility]  . SKIN CANCER EXCISION     bowens disease    There were no vitals filed for this visit.  Subjective Assessment - 09/25/19 1412    Subjective  I am on the largest dilator now. I saw the doctor and he suggested I use the premarin instead of the revelum and it is helping.    Patient Stated Goals  reduce pain, expanding the vaginal canal    Currently in Pain?  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 Joylene John    Onset Date/Surgical Date  07/03/18    Prior Therapy  yes      Precautions   Precautions  Other (comment)    Precaution Comments  cancer with radiation for anal cancer      Restrictions   Weight Bearing Restrictions  No      [place] residence      Prior Function   Level of Independence  Independent    Vocation  Self employed      Cognition   Overall Cognitive Status  Within Functional Limits for tasks assessed      Posture/Postural Control   Posture/Postural Control  No significant limitations      AROM   Lumbar Extension  full                Pelvic Floor Special Questions - 09/25/19 0001    Pelvic Floor Internal Exam  Patient confirms identification and approves PT to assess the pelvic floor and treatment    Exam  Type  Vaginal    Strength  fair squeeze, definite lift        [facility] Adult PT Treatment/Exercise - 09/25/19 0001      Manual Therapy   Manual Therapy  Internal Pelvic Floor    Soft tissue mobilization  along the labia minora and majora, clitoral hood, and vulvar area    Internal Pelvic Floor  along the levator ani, sides of the bladder, obturator internist  and introitus to expand the tissue; used one finger to 2 fingers width 3 inches in wit rotation of the fingers to loosen uup the band of tissue               PT Short Term Goals - 08/01/19 1706      PT SHORT TERM GOAL #1   Title  independent with initial HEP    Time  4    Period  Weeks    Status  Achieved    Target Date  08/01/19      PT SHORT TERM GOAL #2   Title  understand how to use the dilators to work on perineal soft tissue work with pain level </= 5/10    Time  4    Period  Weeks    Status  Achieved    Target Date  08/01/19      PT SHORT TERM GOAL #3   Title  understand how to do  self perineal massage to separate the labia minora    Time  4    Period  Weeks    Status  Achieved      PT SHORT TERM GOAL #4   Title  fecal leakage decreased >/= 25% due to improved pelvic floor muscle control    Time  4    Period  Weeks    Status  Achieved        PT Long Term Goals - 09/25/19 1711      PT LONG TERM GOAL #1   Title  independent with HEP    Time  8    Period  Weeks    Status  On-going      PT LONG TERM GOAL #2   Title  ability to use the largest size dilator with no pain >/= 3/10and understand she will have to use it for 2 years    Baseline  betwee second to third size    Time  8    Period  Weeks    Status  On-going      PT LONG TERM GOAL #3   Title  urine stream able to go straight and not have to clean herself due to spraying of urine    Time  8    Period  Weeks    Status  On-going      PT LONG TERM GOAL #4   Title  fecal incontinence decreased >/= 75% due to increase control of pelvic floor muscles    Baseline  improved by 75% better and leak one time every 2 weeks; had it today due to eating something that irritated her    Time  8    Period  Weeks    Status  On-going            Plan - 09/25/19 1611    Clinical Impression Statement  Patient is now using Premerin that is increasing the elasticity of the vulvar area so it is able to be stretche dwith greater ease. Patient is on the third dilator.  She tried tomove up several weeks ago but tore at the posterior fourchette area due to the tissue not as pliable. Patient is not having pain in the vulvar area. Patient continues to have radiation changes to the vulvar and perineal skin making it less pliable. Patient urine is off stream due to the extra tissue of the labial minora. Patient will benefit from skilled therapy to work on elongation of her vaginal tissue so she will be able to have a vaginal exam and penile penetration in the future.    Personal Factors and Comorbidities  Comorbidity 1;Time  since onset of injury/illness/exacerbation;Age    Comorbidities  anal cancer with radiation, thyroid cancer    Examination-Activity Limitations  Toileting;Continence    Examination-Participation Restrictions  Interpersonal Relationship    Stability/Clinical Decision Making  Evolving/Moderate complexity    Rehab Potential  Good    PT Frequency  1x / week    PT Duration  8 weeks    PT Treatment/Interventions  Biofeedback;Therapeutic activities;Therapeutic exercise;Neuromuscular re-education;Patient/family education;Dry needling;Scar mobilization;Manual techniques    PT Next Visit Plan  work on vagina canal to open, soft tissue work to the perineal area; go over stretches and bulging of the pelvic floor    Consulted and Agree with Plan of Care  Patient       Patient will benefit from skilled therapeutic intervention in order to improve the following deficits and impairments:  Decreased coordination, Increased fascial restricitons, Pain, Decreased strength  Visit Diagnosis: Muscle weakness (generalized) - Plan: PT plan of care cert/re-cert  Cramp and spasm - Plan: PT plan of care cert/re-cert  Unspecified lack of coordination - Plan: PT plan of care cert/re-cert  Aftercare following surgery for neoplasm - Plan: PT plan of care cert/re-cert     Problem List Patient Active Problem List   Diagnosis Date Noted  . Family history of breast cancer   . Family history of lung cancer   . Family history of ovarian cancer   . Family history of non-Hodgkin's lymphoma   . 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 Port catheter in place 11/21/2016  . 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 Anal cancer ([place]) 11/03/2016  . Cystitis 05/06/2015  . Degenerative cervical disc 04/08/2015  . Trigger point of neck 03/26/2015  . Pain in joint, ankle and foot 04/23/2013  . Visit for preventive health examination 02/13/2013  . Routine gynecological examination 02/13/2013  . Family  history of breast cancer in first degree relative 02/13/2013  . Rash and nonspecific skin eruption 02/13/2013  . 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 VITAMIN D DEFICIENCY 06/25/2008  . NECK PAIN 06/02/2008  . FOOT PAIN 06/02/2008  . OSTEOPENIA 06/02/2008    Earlie Counts, PT 09/25/19 5:13 PM   [facility] [address] [place], [place], [address] Phone: [phone]   Fax:  [phone]  Name: SAVANNAHA GRAM MRN: [id number] Date of Birth: 07-14-1951

## 2019-10-02 ENCOUNTER — Ambulatory Visit: Payer: Medicare HMO | Admitting: Physical Therapy

## 2019-10-02 ENCOUNTER — Encounter: Payer: Self-pay | Admitting: Physical Therapy

## 2019-10-02 ENCOUNTER — Other Ambulatory Visit: Payer: Self-pay

## 2019-10-02 DIAGNOSIS — R279 Unspecified lack of coordination: Secondary | ICD-10-CM | POA: Diagnosis not present

## 2019-10-02 DIAGNOSIS — Z483 Aftercare following surgery for neoplasm: Secondary | ICD-10-CM

## 2019-10-02 DIAGNOSIS — M6281 Muscle weakness (generalized): Secondary | ICD-10-CM | POA: Diagnosis not present

## 2019-10-02 DIAGNOSIS — R252 Cramp and spasm: Secondary | ICD-10-CM | POA: Diagnosis not present

## 2019-10-02 NOTE — Therapy (Signed)
[facility] [address], [place], [place], [address] Phone: [phone]   Fax:  [phone]  Physical Therapy Treatment  Patient Details  Name: Ashley Moore MRN: [id number] Date of Birth: [birth year] Referring Provider (PT): Dr. Joylene John   Encounter Date: 10/02/2019  PT End of Session - 10/02/19 1443    Visit Number  8    Date for PT Re-Evaluation  11/20/19    Authorization Type  [employer]    PT Start Time  [id number]    PT Stop Time  1545    PT Time Calculation (min)  60 min    Activity Tolerance  Patient tolerated treatment well;No increased pain    Behavior During Therapy  WFL for tasks assessed/performed       Past Medical History:  Diagnosis Date  . Bowen's disease    excised 1992  . Family history of breast cancer   . Family history of lung cancer   . Family history of non-Hodgkin's lymphoma   . Family history of ovarian cancer   . Headache   . Hx of varicella   . 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([place])   . Thyroid cancer [facility])     Past Surgical History:  Procedure Laterality Date  . BUNIONECTOMY Right   . IR GENERIC HISTORICAL  11/14/2016   IR US GUIDE VASC ACCESS RIGHT 11/14/2016 [facility]  . IR GENERIC HISTORICAL  11/14/2016   IR FLUORO GUIDE CV LINE RIGHT 11/14/2016 [facility]  . IR GENERIC HISTORICAL  12/12/2016   IR US GUIDE VASC ACCESS RIGHT 12/12/2016 [name], MD [facility]  . IR GENERIC HISTORICAL  12/12/2016   IR FLUORO GUIDE CV LINE RIGHT 12/12/2016 [name], MD [facility]  . SKIN CANCER EXCISION     bowens disease    There were no vitals filed for this visit.  Subjective Assessment - 10/02/19 1447    Subjective  Since last visit I have had blood, diahrrhea, and stomach pain. I did use the largest dilator the day after the manual work. I have less diarrhea. I am getting up every 2 hours during the night and has been like that all weekend.    Patient Stated Goals  reduce pain, expanding  the vaginal canal    Currently in Pain?  No/denies                    Pelvic Floor Special Questions - 10/02/19 0001    Pelvic Floor Internal Exam  Patient confirms identification and approves PT to assess the pelvic floor and treatment    Exam Type  Vaginal    Palpation  after therapist has guided patient to contract the lower abdominals further her strength went to a 4/5. Patient had some difficulty with relaxing the pelvic floor    Strength  fair squeeze, definite lift   initially       [facility] Adult PT Treatment/Exercise - 10/02/19 0001      Self-Care   Self-Care  Other Self-Care Comments    Other Self-Care Comments   instructed patient on using a vibrating wand on the vaginal wall to promote blood flow and improve the tissue and where she can get one. discussed with patient on her symptoms she had after the manual work last week sounded more like a virus than a reaction to the therapy. Her symptoms included diarrhea, abdominal cramping and chills.       Neuro Re-ed    Neuro Re-ed  Details   work on pelvic floor contraction and relaxation, pelvic floor contraction with engagement of the lower abdominal to increase the pelvic floor contraction      Manual Therapy   Manual Therapy  Internal Pelvic Floor;Soft tissue mobilization    Soft tissue mobilization  along the labia minora and majora, clitoral hood, and vulvar area, around the outside of levator ani to improve tissue mobility and used [employer]    Internal Pelvic Floor  along the levator ani, obturator internist, sides of the urethra sphincter, sides of the bladder, ATFP and sides of the introitus             PT Education - 10/02/19 1554    Education Details  education on purchasing a vibrating wand to work on the vaginal tissue    Person(s) Educated  Patient    Methods  Explanation    Comprehension  Verbalized understanding       PT Short Term Goals - 08/01/19 1706      PT SHORT TERM GOAL  #1   Title  independent with initial HEP    Time  4    Period  Weeks    Status  Achieved    Target Date  08/01/19      PT SHORT TERM GOAL #2   Title  understand how to use the dilators to work on perineal soft tissue work with pain level </= 5/10    Time  4    Period  Weeks    Status  Achieved    Target Date  08/01/19      PT SHORT TERM GOAL #3   Title  understand how to do self perineal massage to separate the labia minora    Time  4    Period  Weeks    Status  Achieved      PT SHORT TERM GOAL #4   Title  fecal leakage decreased >/= 25% due to improved pelvic floor muscle control    Time  4    Period  Weeks    Status  Achieved        PT Long Term Goals - 09/25/19 1711      PT LONG TERM GOAL #1   Title  independent with HEP    Time  8    Period  Weeks    Status  On-going      PT LONG TERM GOAL #2   Title  ability to use the largest size dilator with no pain >/= 3/10and understand she will have to use it for 2 years    Baseline  betwee second to third size    Time  8    Period  Weeks    Status  On-going      PT LONG TERM GOAL #3   Title  urine stream able to go straight and not have to clean herself due to spraying of urine    Time  8    Period  Weeks    Status  On-going      PT LONG TERM GOAL #4   Title  fecal incontinence decreased >/= 75% due to increase control of pelvic floor muscles    Baseline  improved by 75% better and leak one time every 2 weeks; had it today due to eating something that irritated her    Time  8    Period  Weeks    Status  On-going  Plan - 10/02/19 1557    Clinical Impression Statement  Patient was able to use the next size dilator after last session but she stopped using it due to having abdominal cramps, diarrhea and chills. Patient reports she had one drop of light red blood. After we discussed her symptoms she realized it could of been a virus. Patient monspubis and the labia had increased softness after manual  work. Therapist was able to place her index finger into the vaginal canal with ease. Patient pelvic floor muscles had increased softness after the manual work. Pelvic floor strength was initially 3/5 but after she engaged her lower abdominals more it increased to 4/5.Patient has some difficulty with relaxing her pelvic floor after contraction . Patient reports she is going to the bathroom every 2 hours since she was not feeling well. Patient will benefit from skilled therapy to work on elongation of her vaginal tissue so she will be able to have a vaginal exam and penile penetration in the future.    Personal Factors and Comorbidities  Comorbidity 1;Time since onset of injury/illness/exacerbation;Age    Comorbidities  anal cancer with radiation, thyroid cancer    Examination-Activity Limitations  Toileting;Continence    Examination-Participation Restrictions  Interpersonal Relationship    Stability/Clinical Decision Making  Evolving/Moderate complexity    Rehab Potential  Good    PT Frequency  1x / week    PT Duration  8 weeks    PT Treatment/Interventions  Biofeedback;Therapeutic activities;Therapeutic exercise;Neuromuscular re-education;Patient/family education;Dry needling;Scar mobilization;Manual techniques    PT Next Visit Plan  work on vagina canal to open, soft tissue work to the perineal area; go over stretches and bulging of the pelvic floor; see if she has used the bigger dilator and got the wand    Consulted and Agree with Plan of Care  Patient       Patient will benefit from skilled therapeutic intervention in order to improve the following deficits and impairments:  Decreased coordination, Increased fascial restricitons, Pain, Decreased strength  Visit Diagnosis: Muscle weakness (generalized)  Cramp and spasm  Unspecified lack of coordination  Aftercare following surgery for neoplasm     Problem List Patient Active Problem List   Diagnosis Date Noted  . Family history of  breast cancer   . Family history of lung cancer   . Family history of ovarian cancer   . Family history of non-Hodgkin's lymphoma   . 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 Port catheter in place 11/21/2016  . 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 Anal cancer ([place]) 11/03/2016  . Cystitis 05/06/2015  . Degenerative cervical disc 04/08/2015  . Trigger point of neck 03/26/2015  . Pain in joint, ankle and foot 04/23/2013  . Visit for preventive health examination 02/13/2013  . Routine gynecological examination 02/13/2013  . Family history of breast cancer in first degree relative 02/13/2013  . Rash and nonspecific skin eruption 02/13/2013  . 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 VITAMIN D DEFICIENCY 06/25/2008  . NECK PAIN 06/02/2008  . FOOT PAIN 06/02/2008  . OSTEOPENIA 06/02/2008    Earlie Counts, PT 10/02/19 4:05 PM   [facility] [address], [place], [place], [address] Phone: [phone]   Fax:  [phone]  Name: Ashley Moore MRN: [id number] Date of Birth: Dec 31, 1950

## 2019-10-09 ENCOUNTER — Encounter: Payer: Medicare HMO | Admitting: Physical Therapy

## 2019-10-14 ENCOUNTER — Other Ambulatory Visit: Payer: Self-pay | Admitting: Neurology

## 2019-10-16 ENCOUNTER — Other Ambulatory Visit: Payer: Self-pay

## 2019-10-16 ENCOUNTER — Encounter: Payer: Self-pay | Admitting: Physical Therapy

## 2019-10-16 ENCOUNTER — Ambulatory Visit: Payer: Medicare HMO | Admitting: Physical Therapy

## 2019-10-16 DIAGNOSIS — R279 Unspecified lack of coordination: Secondary | ICD-10-CM | POA: Diagnosis not present

## 2019-10-16 DIAGNOSIS — R252 Cramp and spasm: Secondary | ICD-10-CM | POA: Diagnosis not present

## 2019-10-16 DIAGNOSIS — M6281 Muscle weakness (generalized): Secondary | ICD-10-CM | POA: Diagnosis not present

## 2019-10-16 DIAGNOSIS — Z483 Aftercare following surgery for neoplasm: Secondary | ICD-10-CM | POA: Diagnosis not present

## 2019-10-16 NOTE — Therapy (Signed)
[facility] [address], [place], [place], [address] Phone: [phone]   Fax:  [phone]  Physical Therapy Treatment  Patient Details  Name: Ashley Moore MRN: [id number] Date of Birth: April 30, 1951 Referring Provider (PT): Dr. Joylene John   Encounter Date: 10/16/2019  PT End of Session - 10/16/19 1317    Visit Number  9    Date for PT Re-Evaluation  11/20/19    Authorization Type  [employer]    PT Start Time  1150    PT Stop Time  1228    PT Time Calculation (min)  38 min    Activity Tolerance  Patient tolerated treatment well;No increased pain    Behavior During Therapy  WFL for tasks assessed/performed       Past Medical History:  Diagnosis Date  . Bowen's disease    excised 1992  . Family history of breast cancer   . Family history of lung cancer   . Family history of non-Hodgkin's lymphoma   . Family history of ovarian cancer   . Headache   . Hx of varicella   . 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([place])   . Thyroid cancer [facility])     Past Surgical History:  Procedure Laterality Date  . BUNIONECTOMY Right   . IR GENERIC HISTORICAL  11/14/2016   IR US GUIDE VASC ACCESS RIGHT 11/14/2016 [facility]  . IR GENERIC HISTORICAL  11/14/2016   IR FLUORO GUIDE CV LINE RIGHT 11/14/2016 [facility]  . IR GENERIC HISTORICAL  12/12/2016   IR US GUIDE VASC ACCESS RIGHT 12/12/2016 [name], MD [facility]  . IR GENERIC HISTORICAL  12/12/2016   IR FLUORO GUIDE CV LINE RIGHT 12/12/2016 [name], MD [facility]  . SKIN CANCER EXCISION     bowens disease    There were no vitals filed for this visit.  Subjective Assessment - 10/16/19 1154    Subjective  I have not gone to the next size dilator. I got the want that vibrates and it helps with the pain with the dilator. Pain level with dilator in the small section is a 10/10 and the rest of the area is 0/10.    Patient Stated Goals  reduce pain, expanding the vaginal canal    Currently in Pain?  No/denies                    Pelvic Floor Special Questions - 10/16/19 0001    Pelvic Floor Internal Exam  Patient confirms identification and approves PT to assess the pelvic floor and treatment    Exam Type  Vaginal        [facility] Adult PT Treatment/Exercise - 10/16/19 0001      Self-Care   Self-Care  Other Self-Care Comments    Other Self-Care Comments   discussed with patient  using  the vibrating pelvic wand prior to the dilator to improve the mobility of the tissue, discussed bigger size dilators that will vibrate      Manual Therapy   Manual Therapy  Internal Pelvic Floor;Soft tissue mobilization    Soft tissue mobilization  along the labia minora and majora, clitoral hood, and vulvar area, around the outside of levator ani to improve tissue mobility and used [employer]    Internal Pelvic Floor  along the scar at the perineal body, and right introitus, along the levator ani bilaterally               PT  Short Term Goals - 08/01/19 1706      PT SHORT TERM GOAL #1   Title  independent with initial HEP    Time  4    Period  Weeks    Status  Achieved    Target Date  08/01/19      PT SHORT TERM GOAL #2   Title  understand how to use the dilators to work on perineal soft tissue work with pain level </= 5/10    Time  4    Period  Weeks    Status  Achieved    Target Date  08/01/19      PT SHORT TERM GOAL #3   Title  understand how to do self perineal massage to separate the labia minora    Time  4    Period  Weeks    Status  Achieved      PT SHORT TERM GOAL #4   Title  fecal leakage decreased >/= 25% due to improved pelvic floor muscle control    Time  4    Period  Weeks    Status  Achieved        PT Long Term Goals - 09/25/19 1711      PT LONG TERM GOAL #1   Title  independent with HEP    Time  8    Period  Weeks    Status  On-going      PT LONG TERM GOAL #2   Title  ability to use the largest size  dilator with no pain >/= 3/10and understand she will have to use it for 2 years    Baseline  betwee second to third size    Time  8    Period  Weeks    Status  On-going      PT LONG TERM GOAL #3   Title  urine stream able to go straight and not have to clean herself due to spraying of urine    Time  8    Period  Weeks    Status  On-going      PT LONG TERM GOAL #4   Title  fecal incontinence decreased >/= 75% due to increase control of pelvic floor muscles    Baseline  improved by 75% better and leak one time every 2 weeks; had it today due to eating something that irritated her    Time  8    Period  Weeks    Status  On-going            Plan - 10/16/19 1318    Clinical Impression Statement  Patient has softer levator ani muscles. Patient is still on the same dilator but not pain in the vagina except where she tore at the perineal body and the pain level is 10/10. Patient is consistent with her HEP. Patient will start using the vibrator prior to the dilator to expand the tissue. Patient will benefit from skilled therapy to work on elongation of her vaginal tissue so she will be able to have a vaginal exam and penile penetration.    Personal Factors and Comorbidities  Comorbidity 1;Time since onset of injury/illness/exacerbation;Age    Comorbidities  anal cancer with radiation, thyroid cancer    Examination-Activity Limitations  Toileting;Continence    Examination-Participation Restrictions  Interpersonal Relationship    Stability/Clinical Decision Making  Evolving/Moderate complexity    Rehab Potential  Good    PT Frequency  1x / week    PT Duration  8 weeks    PT Treatment/Interventions  Biofeedback;Therapeutic activities;Therapeutic exercise;Neuromuscular re-education;Patient/family education;Dry needling;Scar mobilization;Manual techniques    PT Next Visit Plan  write 10th note, see if patient wants to continue. If she wants to continue add in [employer] and Agree with  Plan of Care  Patient       Patient will benefit from skilled therapeutic intervention in order to improve the following deficits and impairments:  Decreased coordination, Increased fascial restricitons, Pain, Decreased strength  Visit Diagnosis: Muscle weakness (generalized)  Cramp and spasm  Unspecified lack of coordination  Aftercare following surgery for neoplasm     Problem List Patient Active Problem List   Diagnosis Date Noted  . Family history of breast cancer   . Family history of lung cancer   . Family history of ovarian cancer   . Family history of non-Hodgkin's lymphoma   . 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 Port catheter in place 11/21/2016  . 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 Anal cancer ([place]) 11/03/2016  . Cystitis 05/06/2015  . Degenerative cervical disc 04/08/2015  . Trigger point of neck 03/26/2015  . Pain in joint, ankle and foot 04/23/2013  . Visit for preventive health examination 02/13/2013  . Routine gynecological examination 02/13/2013  . Family history of breast cancer in first degree relative 02/13/2013  . Rash and nonspecific skin eruption 02/13/2013  . 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 VITAMIN D DEFICIENCY 06/25/2008  . NECK PAIN 06/02/2008  . FOOT PAIN 06/02/2008  . OSTEOPENIA 06/02/2008    Earlie Counts, PT 10/16/19 2:00 PM   [facility] [address] [place], [place], [address] Phone: [phone]   Fax:  [phone]  Name: KALYNDA POPAL MRN: [id number] Date of Birth: 11-18-1950

## 2019-10-24 ENCOUNTER — Encounter: Payer: Self-pay | Admitting: Physical Therapy

## 2019-10-24 ENCOUNTER — Ambulatory Visit: Payer: Medicare HMO | Attending: Gynecologic Oncology | Admitting: Physical Therapy

## 2019-10-24 ENCOUNTER — Other Ambulatory Visit: Payer: Self-pay

## 2019-10-24 DIAGNOSIS — M6281 Muscle weakness (generalized): Secondary | ICD-10-CM | POA: Insufficient documentation

## 2019-10-24 DIAGNOSIS — Z483 Aftercare following surgery for neoplasm: Secondary | ICD-10-CM | POA: Diagnosis not present

## 2019-10-24 DIAGNOSIS — R279 Unspecified lack of coordination: Secondary | ICD-10-CM | POA: Diagnosis not present

## 2019-10-24 DIAGNOSIS — R252 Cramp and spasm: Secondary | ICD-10-CM | POA: Diagnosis not present

## 2019-10-24 NOTE — Therapy (Signed)
[facility] [address] [place], [place], [address] Phone: [phone]   Fax:  [phone]  Physical Therapy Treatment  Patient Details  Name: Ashley Moore MRN: [id number] Date of Birth: 04-Feb-1951 Referring Provider (PT): Dr. Joylene John Progress Note Reporting Period 07/04/2019 to 10/24/2019  See note below for Objective Data and Assessment of Progress/Goals.       Encounter Date: 10/24/2019  PT End of Session - 10/24/19 0928    Visit Number  10    Date for PT Re-Evaluation  11/20/19    Authorization Type  [employer]    PT Start Time  0845    PT Stop Time  0928    PT Time Calculation (min)  43 min    Activity Tolerance  Patient tolerated treatment well;No increased pain    Behavior During Therapy  WFL for tasks assessed/performed       Past Medical History:  Diagnosis Date  . Bowen's disease    excised 1992  . Family history of breast cancer   . Family history of lung cancer   . Family history of non-Hodgkin's lymphoma   . Family history of ovarian cancer   . Headache   . Hx of varicella   . 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([place])   . Thyroid cancer [facility])     Past Surgical History:  Procedure Laterality Date  . BUNIONECTOMY Right   . IR GENERIC HISTORICAL  11/14/2016   IR US GUIDE VASC ACCESS RIGHT 11/14/2016 [facility]  . IR GENERIC HISTORICAL  11/14/2016   IR FLUORO GUIDE CV LINE RIGHT 11/14/2016 [facility]  . IR GENERIC HISTORICAL  12/12/2016   IR US GUIDE VASC ACCESS RIGHT 12/12/2016 [name], MD [facility]  . IR GENERIC HISTORICAL  12/12/2016   IR FLUORO GUIDE CV LINE RIGHT 12/12/2016 [name], MD [facility]  . SKIN CANCER EXCISION     bowens disease    There were no vitals filed for this visit.  Subjective Assessment - 10/24/19 0847    Subjective  Last night I had bleeding with using the dilator.    Patient Stated Goals  reduce pain, expanding the vaginal canal    Currently in Pain?   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 Joylene John    Onset Date/Surgical Date  07/03/18    Prior Therapy  yes      Precautions   Precautions  Other (comment)    Precaution Comments  cancer with radiation for anal cancer      Restrictions   Weight Bearing Restrictions  No      [place] residence      Prior Function   Level of Independence  Independent    Vocation  Self employed      Cognition   Overall Cognitive Status  Within Functional Limits for tasks assessed      Posture/Postural Control   Posture/Postural Control  No significant limitations      AROM   Lumbar Extension  full      Strength   Right Hip ABduction  4/5                Pelvic Floor Special Questions - 10/24/19 0001    Urinary Leakage  No    Pad use  no pads    Fecal incontinence  --   75% better   Strength  fair squeeze, definite lift   initially       [facility] Adult PT Treatment/Exercise - 10/24/19 0001      Self-Care   Self-Care  Other Self-Care Comments    Other Self-Care Comments   educated patient on different vibrators that are dilators, using lubricants with the dilators, gave patient different samples of lubricants, how to the vibrate the scar to improve sensisitivty , increase the mobility fo rht vaginal tissue      Exercises   Exercises  Other Exercises    Other Exercises   sitting and doing the pelvic floor contractions to imporve strength and blood flow             PT Education - 10/24/19 0925    Education Details  education on vaginal lubricant, pelvic floor exercises, how to take care of dilator and wand, different dilators that vibrate, reviewed HEP    Person(s) Educated  Patient    Methods  Explanation;Demonstration;Handout    Comprehension  Verbalized understanding        PT Short Term Goals - 10/24/19 0911      PT SHORT TERM GOAL #1   Title  independent with initial HEP    Time  4    Period  Weeks    Status  Achieved      PT SHORT TERM GOAL #2   Title  understand how to use the dilators to work on perineal soft tissue work with pain level </= 5/10    Time  4    Period  Weeks    Status  Achieved      PT SHORT TERM GOAL #3   Title  understand how to do self perineal massage to separate the labia minora    Time  4    Period  Weeks    Status  Achieved      PT SHORT TERM GOAL #4   Title  fecal leakage decreased >/= 25% due to improved pelvic floor muscle control    Time  4    Period  Weeks    Status  Achieved        PT Long Term Goals - 10/24/19 0914      PT LONG TERM GOAL #1   Title  independent with HEP    Time  8    Period  Weeks    Status  Achieved      PT LONG TERM GOAL #2   Title  ability to use the largest size dilator with no pain >/= 3/10and understand she will have to use it for 2 years    Baseline  Patient on the third size    Time  8    Period  Weeks    Status  Partially Met      PT LONG TERM GOAL #3   Title  urine stream able to go straight and not have to clean herself due to spraying of urine    Baseline  due to the enlarged labia minora    Time  8    Period  Weeks    Status  Not Met      PT LONG TERM GOAL #4   Title  fecal incontinence decreased >/= 75% due to increase control of pelvic floor muscles    Time  8    Period  Weeks  Status  Achieved            Plan - 10/24/19 0909    Clinical Impression Statement  Patient is not having urinary leakage. Patient fecal leakage is 75% better. Pelvic floor strength is 3/5. Patient is on the third dilator. Patient is using a vibrating wand to massage the vaginal tissue to elongate tissue and improve bloodflow. Patient urine stream goes all over due to the enlarged labia she has. Patient tore the other day when she used the third size dilator. Patient is  letting the area heal. Patient is independent with her HEP and does it every night for one hour. Patient is ready for discharge to progress with her dilator, massage wand and manual work.    Personal Factors and Comorbidities  Comorbidity 1;Time since onset of injury/illness/exacerbation;Age    Comorbidities  anal cancer with radiation, thyroid cancer    Examination-Activity Limitations  Toileting;Continence    Examination-Participation Restrictions  Interpersonal Relationship    Stability/Clinical Decision Making  Evolving/Moderate complexity    Rehab Potential  Good    PT Treatment/Interventions  Biofeedback;Therapeutic activities;Therapeutic exercise;Neuromuscular re-education;Patient/family education;Dry needling;Scar mobilization;Manual techniques    PT Next Visit Plan  Discharge to HEP    Recommended Other Services  MD signed all notes    Consulted and Agree with Plan of Care  Patient       Patient will benefit from skilled therapeutic intervention in order to improve the following deficits and impairments:  Decreased coordination, Increased fascial restricitons, Pain, Decreased strength  Visit Diagnosis: Muscle weakness (generalized)  Cramp and spasm  Unspecified lack of coordination  Aftercare following surgery for neoplasm     Problem List Patient Active Problem List   Diagnosis Date Noted  . Family history of breast cancer   . Family history of lung cancer   . Family history of ovarian cancer   . Family history of non-Hodgkin's lymphoma   . 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 Port catheter in place 11/21/2016  . 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 Anal cancer ([place]) 11/03/2016  . Cystitis 05/06/2015  . Degenerative cervical disc 04/08/2015  . Trigger point of neck 03/26/2015  . Pain in joint, ankle and foot 04/23/2013  . Visit for preventive health examination 02/13/2013  . Routine gynecological examination 02/13/2013  . Family history  of breast cancer in first degree relative 02/13/2013  . Rash and nonspecific skin eruption 02/13/2013  . 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 VITAMIN D DEFICIENCY 06/25/2008  . NECK PAIN 06/02/2008  . FOOT PAIN 06/02/2008  . OSTEOPENIA 06/02/2008    Earlie Counts, PT 10/24/19 11:26 AM   [facility] [address], [place], [place], [address] Phone: [phone]   Fax:  [phone]  Name: Ashley Moore MRN: [id number] Date of Birth: [birth year]  PHYSICAL THERAPY DISCHARGE SUMMARY  Visits from Start of Care: 10 Current functional level related to goals / functional outcomes: See above.    Remaining deficits: See above.    Education / Equipment: HEP  Plan: Patient agrees to discharge.  Patient goals were partially met. Patient is being discharged due to                                                     Patient insurance does not approve more  visits so she is going to do her HEP for awhile. Earlie Counts, PT 10/24/19 11:28 AM  ?????

## 2019-10-24 NOTE — Patient Instructions (Addendum)
https://www.[employer]vi[employer]Concern.si   Use the vibrating wand prior to the dilator and around the clitorus, anus, labia   Lubrication . Used for intercourse to reduce friction . Avoid ones that have glycerin, warming gels, tingling gels, icing or cooling gel, scented . Avoid parabens due to a preservative similar to female sex hormone . May need to be reapplied once or several times during sexual activity . Can be applied to both partners genitals prior to vaginal penetration to minimize friction or irritation . Prevent irritation and mucosal tears that cause post coital pain and increased the risk of vaginal and urinary tract infections . Oil-based lubricants cannot be used with condoms due to breaking them down.  Least likely to irritate vaginal tissue.  . Plant based-lubes are safe . Silicone-based lubrication are thicker and last long and used for post-menopausal women  Vaginal Lubricators Here is a list of some suggested lubricators you can use for intercourse. Use the most hypoallergenic product.  You can place on you or your partner.   Slippery Stuff ( water based)  Sylk or Sliquid Natural H2O ( good  if frequent UTI's)- walmart, amazon  Sliquid organics silk-(aloe and silicone based )  [employer] (www.blossom-organics.com)- (aloe based )  Coconut oil, olive oil -not good with condoms   PJur Woman Nude- (water based) amazon  Uberlube- ( silicon) [place] has an organic one  Yes lubricant- (water based and has plant oil based similar to silicone) [employer] Platinum-Silicone, Target, [employer]reens  Olive and Bee intimate cream-  www.oliveandbee.com.au  [place] Things to avoid in lubricants are glycerin, warming gels, tingling gels, icing or cooling  gels, and scented gels.  Also avoid Vaseline. KY jelly, Replens, and Astroglide kills good  bacteria(lactobacilli)  Things to avoid in the vaginal area . Do not use things to irritate the vulvar area . No lotions- see below . No soaps; can use Aveeno or Calendula cleanser if needed. Must be gentle . No deodorants . No douches . Good to sleep without underwear to let the vaginal area to air out . No scrubbing: spread the lips to let warm water rinse over labias and pat dry  Creams that can be used on the Vulva Area  V [employer], walmart  Vital V Wild Yam Salve  Julva- [employer] Botanical Pro-Meno Wild Yam Cream  Coconut oil, olive oil  Cleo by [employer] labial moisturizer -Amazon,   [place] Releveum ( lidocaine) or [place] Harvest Gele       Slow Contraction: Gravity Resisted (Sitting)    Sitting, slowly squeeze pelvic floor for _10__ seconds. Rest for _10__ seconds. Repeat __10_ times. Do __3_ times a day. Quick flicks 5 times.  Copyright  VHI. All rights reserved.   [place] [address] [place], [place] [address] Phone # [phone] Fax [phone]

## 2019-10-26 DIAGNOSIS — Z20828 Contact with and (suspected) exposure to other viral communicable diseases: Secondary | ICD-10-CM | POA: Diagnosis not present

## 2019-10-30 ENCOUNTER — Other Ambulatory Visit: Payer: Self-pay

## 2019-10-30 ENCOUNTER — Ambulatory Visit
Admission: RE | Admit: 2019-10-30 | Discharge: 2019-10-30 | Disposition: A | Discharge status: Home / Self Care | Payer: Medicare HMO | Source: Ambulatory Visit | Attending: Internal Medicine | Admitting: Internal Medicine

## 2019-10-30 DIAGNOSIS — Z1231 Encounter for screening mammogram for malignant neoplasm of breast: Secondary | ICD-10-CM

## 2019-10-31 ENCOUNTER — Encounter: Payer: Self-pay | Admitting: Gynecologic Oncology

## 2019-10-31 ENCOUNTER — Inpatient Hospital Stay: Payer: Medicare HMO | Attending: Gynecologic Oncology | Admitting: Gynecologic Oncology

## 2019-10-31 DIAGNOSIS — N3281 Overactive bladder: Secondary | ICD-10-CM

## 2019-10-31 DIAGNOSIS — C210 Malignant neoplasm of anus, unspecified: Secondary | ICD-10-CM

## 2019-10-31 DIAGNOSIS — Q525 Fusion of labia: Secondary | ICD-10-CM | POA: Diagnosis not present

## 2019-10-31 NOTE — Progress Notes (Signed)
Gynecologic Oncology Telehealth Consult Note: Gyn-Onc  I connected with Ashley Moore on 10/31/19 at 2:30PM EST by telephone and verified that I am speaking with the correct person using two identifiers.  I discussed the limitations, risks, security and privacy concerns of performing an evaluation and management service by telemedicine and the availability of in-person appointments. I also discussed with the patient that there may be a patient responsible charge related to this service. The patient expressed understanding and agreed to proceed.  Other persons participating in the visit and their role in the encounter: none.  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  Radiation: 11/14/16-12/23/16: 54 Gy to the pelvic nodes, anus, and bilateral groins.  At diagnosis, she had advanced locoregional disease with bilateral inguinal adenopathy, PET positive presacral, left pelvic sidewall, and a lesion of the left labia consistent with a satellite lesion. She was treated with 5FU and radiotherapy and developed significant desquamation of the tissue. She has been seen due to labial agglutination by Dr. Myrene Galas and continues with premarin daily for this. She also is being followed by Dr. Quentin Cornwall in [place] for her anoscopy.    Anal cancer ([place])  11/03/2016 Initial Diagnosis   Anal cancer ([place])   06/27/2019 Imaging   CT C/A/P - No evidence of recurrent or metastatic carcinoma.   Mild diffuse bladder wall thickening consistent with cystitis, likely due to prior radiation therapy. Colonic diverticulosis, without radiographic evidence of diverticulitis. Aortic and coronary artery atherosclerosis.     Interval History: Patient reports that overall things  are going well.  She completed pelvic physical therapy last week and has noticed significant improvement.  She has been using massage and dilators more frequently and noticed that her tissue in general is more pliable and soft.  She continues to have intermittent pain but overall thinks this has improved as well.  She has had some decrease of fecal incontinence since starting pelvic physical therapy.  She continues to have issues with her urinary stream and significant spray, requiring that she cleans herself after urinating.  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  Past Medical/Surgical History: Past Medical History:  Diagnosis Date  . Bowen's disease    excised 1992  . Family history of breast cancer   . Family history of lung cancer   . Family history of non-Hodgkin's lymphoma   . Family history of ovarian cancer   . Headache   . Hx of varicella   . 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([place])   . Thyroid cancer [facility])     Past Surgical History:  Procedure Laterality Date  . BUNIONECTOMY Right   . IR GENERIC HISTORICAL  11/14/2016   IR US GUIDE VASC ACCESS RIGHT 11/14/2016 [facility]  . IR GENERIC HISTORICAL  11/14/2016   IR FLUORO GUIDE CV LINE RIGHT 11/14/2016 [facility]  . IR GENERIC HISTORICAL  12/12/2016   IR US GUIDE VASC ACCESS RIGHT 12/12/2016 [name], MD [facility]  . IR GENERIC HISTORICAL  12/12/2016   IR FLUORO GUIDE CV LINE RIGHT 12/12/2016 [name], MD [facility]  . SKIN CANCER EXCISION     bowens disease    Family History  Problem Relation Age of Onset  . Lung cancer Mother        lung  . Breast  cancer Mother 39  . Ovarian cancer Mother 84  . Pancreatitis Father   . Breast cancer Sister 78  . Breast cancer Maternal Grandmother 62       breast   . Breast cancer Maternal Aunt 75       breast  . Melanoma Sister   . Breast cancer Sister 53  . Non-Hodgkin's lymphoma Paternal Grandmother     Social History   Socioeconomic History  . Marital status: Widowed     Spouse name: Not on file  . Number of children: 0  . Years of education: 75  . Highest education level: Associate degree: academic program  Occupational History  . 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 Smoking status: Former Research scientist (life sciences)  . Smokeless tobacco: Former Systems developer    Quit date: 09/20/1995  Substance and Sexual Activity  . Alcohol use: Yes    Alcohol/week: 0.0 standard drinks    Comment: occasional  . Drug use: Yes    Types: Marijuana  . Sexual activity: Never  Other Topics Concern  . Not on file  Social History Narrative   H H  of 1      5 pets.   She is a former smoker   Retired Programmer, applications; Conservator, museum/gallery   [employer]   Red wine  1 per night.    Tea green tea and earl gray    Moved from [place] to [place] area in 1984/01/07   1 pregnancy   Husband died spring  2016 cv   Sister died 09-Apr-2015  Bone cancer    3 remaining sisters            Social Determinants of Health   Financial Resource Strain: Low Risk   . Difficulty of Paying Living Expenses: Not very hard  Food Insecurity: No Food Insecurity  . Worried About Charity fundraiser in the Last Year: Never true  . Ran Out of Food in the Last Year: Never true  Transportation Needs: No Transportation Needs  . Lack of Transportation (Medical): No  . Lack of Transportation (Non-Medical): No  Physical Activity: Insufficiently Active  . Days of Exercise per Week: 4 days  . Minutes of Exercise per Session: 30 min  Stress: Stress Concern Present  . Feeling of Stress : To some extent  Social Connections: Somewhat Isolated  . Frequency of Communication with Friends and Family: More than three times a week  . Frequency of Social Gatherings with Friends and Family: Not on file  . Attends Religious Services: Never  . Active Member of Clubs or Organizations: Yes  . Attends Archivist Meetings: Never  . Marital Status:  Widowed    Current Medications:  Current Outpatient Medications:  .  cholecalciferol (VITAMIN D3) 25 MCG (1000 UT) tablet, Take 5,000 Units by mouth daily., Disp: , Rfl:  .  conjugated estrogens (PREMARIN) vaginal cream, Place 0.5 Applicatorfuls vaginally at bedtime. , Disp: , Rfl:  .  meloxicam (MOBIC) 15 MG tablet, TAKE 1 TABLET EVERY DAY  ( APPOINTMENT IS NEEDED  ), Disp: 90 tablet, Rfl: 0 .  Multiple Vitamin (MULTIVITAMIN) tablet, Take 1 tablet by mouth daily., Disp: , Rfl:  .  solifenacin (VESICARE) 5 MG tablet, Take by mouth daily. , Disp: , Rfl:  .  tiZANidine (ZANAFLEX) 4 MG tablet, TAKE 1 TABLET EVERY 8 HOURS AS NEEDED FOR MUSCLE SPASM(S) (Patient taking  differently: 6 mg. ), Disp: 90 tablet, Rfl: 5 .  Turmeric 500 MG CAPS, Take 1 capsule by mouth daily., Disp: , Rfl:   Review of Symptoms: Pertinent positives as per HPI, otherwise review of symptoms negative.  Physical Exam: There were no vitals taken for this visit. Not performed given limitations of visit type.  Laboratory & Radiologic Studies: None new  Assessment & Plan: Ashley Moore is a 69 y.o. woman with a  history of squamous cell carcinoma of the anus who completed chemoradiation in 2018 who also has a remote history of Bowen's disease requiring vulvar resection now with pelvic pain and labial agglutination.  The patient voices having significant improvement in her vaginal and vulvar symptoms over the course of physical therapy.  She is very happy with her progress and would like to use the next 6-8 months to see what further progress she can achieve on her own.  We talked again about the possibility of surgery for labial separation, which could include either separation or separation and reduction.  She very much understands the risk of reagglutination while healing with this surgery, and the added complication in her case given the history of radiation.  We ultimately decided that she would call my office back  sometime after April to schedule a follow-up phone visit in July or August.  At that time, we will reevaluate how she is feeling about moving forward with surgery.  If she would like to move forward, I discussed having her come back into clinic for another exam and to discuss surgical plan together in person.  I discussed the assessment and treatment plan with the patient. The patient was provided with an opportunity to ask questions and all were answered. The patient agreed with the plan and demonstrated an understanding of the instructions.   The patient was advised to call back or see an in-person evaluation if the symptoms worsen or if the condition fails to improve as anticipated.   15 minutes of total time was spent for this patient encounter, including preparation, face-to-face counseling with the patient and coordination of care, and documentation of the encounter.   Jeral Pinch, MD  Division of Gynecologic Oncology  Department of Obstetrics and Gynecology  [facility]

## 2019-11-04 ENCOUNTER — Other Ambulatory Visit: Payer: Self-pay | Admitting: Internal Medicine

## 2019-11-04 DIAGNOSIS — R928 Other abnormal and inconclusive findings on diagnostic imaging of breast: Secondary | ICD-10-CM

## 2019-11-17 ENCOUNTER — Ambulatory Visit: Payer: Medicare HMO | Attending: Internal Medicine

## 2019-11-17 DIAGNOSIS — Z23 Encounter for immunization: Secondary | ICD-10-CM | POA: Insufficient documentation

## 2019-11-17 NOTE — Progress Notes (Signed)
   Covid-19 Vaccination Clinic  Name:  Ashley Moore    MRN: [id number] DOB: 1951/06/17  11/17/2019  [name] was observed post Covid-19 immunization for 15 minutes without incidence. She was provided with Vaccine Information Sheet and instruction to access the V-Safe system.   [name] was instructed to call 911 with any severe reactions post vaccine: Marland Kitchen Difficulty breathing  . Swelling of your face and throat  . A fast heartbeat  . A bad rash all over your body  . Dizziness and weakness    Immunizations Administered    Name Date Dose VIS Date Route   Pfizer COVID-19 Vaccine 11/17/2019  1:08 PM 0.3 mL 08/30/2019 Intramuscular   Manufacturer: [place]   Lot: [id number]   [place]: [id number]

## 2019-11-19 ENCOUNTER — Other Ambulatory Visit: Payer: Self-pay | Admitting: Internal Medicine

## 2019-11-19 ENCOUNTER — Ambulatory Visit
Admission: RE | Admit: 2019-11-19 | Discharge: 2019-11-19 | Disposition: A | Discharge status: Home / Self Care | Payer: Medicare HMO | Source: Ambulatory Visit | Attending: Internal Medicine | Admitting: Internal Medicine

## 2019-11-19 ENCOUNTER — Other Ambulatory Visit: Payer: Self-pay

## 2019-11-19 DIAGNOSIS — R928 Other abnormal and inconclusive findings on diagnostic imaging of breast: Secondary | ICD-10-CM

## 2019-11-19 DIAGNOSIS — N6310 Unspecified lump in the right breast, unspecified quadrant: Secondary | ICD-10-CM

## 2019-11-19 DIAGNOSIS — N6489 Other specified disorders of breast: Secondary | ICD-10-CM | POA: Diagnosis not present

## 2019-11-27 ENCOUNTER — Ambulatory Visit
Admission: RE | Admit: 2019-11-27 | Discharge: 2019-11-27 | Disposition: A | Discharge status: Home / Self Care | Payer: Medicare HMO | Source: Ambulatory Visit | Attending: Internal Medicine | Admitting: Internal Medicine

## 2019-11-27 ENCOUNTER — Other Ambulatory Visit: Payer: Self-pay

## 2019-11-27 DIAGNOSIS — N6315 Unspecified lump in the right breast, overlapping quadrants: Secondary | ICD-10-CM | POA: Diagnosis not present

## 2019-11-27 DIAGNOSIS — D241 Benign neoplasm of right breast: Secondary | ICD-10-CM | POA: Diagnosis not present

## 2019-11-27 DIAGNOSIS — N6310 Unspecified lump in the right breast, unspecified quadrant: Secondary | ICD-10-CM

## 2019-11-27 DIAGNOSIS — N6312 Unspecified lump in the right breast, upper inner quadrant: Secondary | ICD-10-CM | POA: Diagnosis not present

## 2019-11-27 HISTORY — PX: BREAST BIOPSY: SHX20

## 2019-12-10 ENCOUNTER — Ambulatory Visit: Payer: Medicare HMO | Attending: Internal Medicine

## 2019-12-10 DIAGNOSIS — Z23 Encounter for immunization: Secondary | ICD-10-CM

## 2019-12-10 NOTE — Progress Notes (Signed)
   Covid-19 Vaccination Clinic  Name:  Ashley Moore    MRN: [id number] DOB: Apr 28, 1951  12/10/2019  [name] was observed post Covid-19 immunization for 15 minutes without incident. She was provided with Vaccine Information Sheet and instruction to access the V-Safe system.   [name] was instructed to call 911 with any severe reactions post vaccine: Marland Kitchen Difficulty breathing  . Swelling of face and throat  . A fast heartbeat  . A bad rash all over body  . Dizziness and weakness   Immunizations Administered    Name Date Dose VIS Date Route   Pfizer COVID-19 Vaccine 12/10/2019 11:24 AM 0.3 mL 08/30/2019 Intramuscular   Manufacturer: [place]   Lot: [id number]   [place]: [id number]

## 2019-12-12 DIAGNOSIS — R3129 Other microscopic hematuria: Secondary | ICD-10-CM | POA: Diagnosis not present

## 2019-12-12 DIAGNOSIS — Q525 Fusion of labia: Secondary | ICD-10-CM | POA: Diagnosis not present

## 2019-12-12 DIAGNOSIS — N3281 Overactive bladder: Secondary | ICD-10-CM | POA: Diagnosis not present

## 2019-12-12 DIAGNOSIS — R8281 Pyuria: Secondary | ICD-10-CM | POA: Diagnosis not present

## 2019-12-12 DIAGNOSIS — R3915 Urgency of urination: Secondary | ICD-10-CM | POA: Diagnosis not present

## 2019-12-12 DIAGNOSIS — R350 Frequency of micturition: Secondary | ICD-10-CM | POA: Diagnosis not present

## 2019-12-19 DIAGNOSIS — C8590 Non-Hodgkin lymphoma, unspecified, unspecified site: Secondary | ICD-10-CM

## 2019-12-19 HISTORY — DX: Non-Hodgkin lymphoma, unspecified, unspecified site: C85.90

## 2019-12-31 ENCOUNTER — Ambulatory Visit: Payer: Self-pay | Admitting: Surgery

## 2019-12-31 DIAGNOSIS — D241 Benign neoplasm of right breast: Secondary | ICD-10-CM

## 2019-12-31 DIAGNOSIS — Z803 Family history of malignant neoplasm of breast: Secondary | ICD-10-CM | POA: Diagnosis not present

## 2019-12-31 NOTE — H&P (Signed)
Ashley Moore Documented: 12/31/2019 3:08 PM Location: [facility] Patient #: [id number] DOB: Aug 13, 1951 Widowed / Language: Cleophus Molt / Race: White Female  History of Present Illness Marcello Moores A. 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 Patient is a very strong family history with at least 3 or 4 first ray relatives that have had breast cancer. She has had multiple other cancers in her family as well and last year began genetic testing but never completed that. Patient denies breast pain, breast mass, nipple discharge or changes to either breast.          Diagnosis Breast, right, needle core biopsy, 3 o'clock - INTRADUCTAL PAPILLOMA.    Screening recall for right breast mass. Strong family history of breast cancer, including in patient's mother diagnosed at age 39, 2 sisters both diagnosed in their 52s, a maternal aunt as well as maternal grandmother. In addition patient has a personal history of thyroid cancer and anorectal cancer.  EXAM: DIGITAL DIAGNOSTIC UNILATERAL RIGHT MAMMOGRAM WITH CAD AND TOMO  RIGHT BREAST ULTRASOUND  COMPARISON: Previous exam(s).  ACR Breast Density Category b: There are scattered areas of fibroglandular density.  FINDINGS: Spot compression tomograms were performed over the lower inner right breast. There is an oval circumscribed mass in the lower inner right breast measuring approximately 0.6 cm.  Mammographic images were processed with CAD.  Targeted ultrasound of the inner right breast was performed. There is an oval hypoechoic mass with slight margin irregularity at 3 o'clock 2 cm from nipple measuring 0.4 x 0.3 x 0.3 cm. Fall this could represent a small intramammary lymph node a cannot be clearly characterized as such and given margin irregularity tissue sampling is warranted. Targeted ultrasound of the right  axilla demonstrates several lymph nodes with slight cortical thickening however preserved echogenic/fatty hila, felt to be within normal limits for this patient.  IMPRESSION: 1. Indeterminate mass in the right breast at the 3 o'clock position.  2. Right axillary lymph nodes with mild cortical thickening, however preserved fatty/echogenic hila, felt to be within normal limits for this patient.  RECOMMENDATION: 1. Recommend ultrasound-guided biopsy of the mass in the right breast at the 3 o'clock position.  2. If pathology from the ultrasound-guided biopsy of mass in the right breast returns as malignant, then recommend the patient return for ultrasound-guided biopsy of 1 of the lymph nodes in the right axilla. If pathology results from ultrasound-guided biopsy of the mass return as benign, then these lymph nodes can be considered benign as well.  I have discussed the findings and recommendations with the patient. If applicable, a reminder letter will be sent to the patient regarding the next appointment.  BI-RADS CATEGORY 4: Suspicious.   Electronically Signed By: Everlean Alstrom M.D. On: 11/19/2019 14:55.  The patient is a 69 year old female.   Past Surgical History (Chanel Teressa Senter, [place]; 12/31/2019 3:08 PM) Foot Surgery Right.  Allergies (Chanel Teressa Senter, CMA; 12/31/2019 3:08 PM) Benadryl *ANTIHISTAMINES* Melatonin *ALTERNATIVE MEDICINES* Penicillins fentaNYL *ANALGESICS - OPIOID* Allergies Reconciled  Medication History (Chanel Teressa Senter, CMA; 12/31/2019 3:10 PM) Oxycodone-Acetaminophen (5-325MG  Tablet, Oral as needed) Active. Simethicone (125MG  Tablet Chewable, Oral) Active. TiZANidine HCl (4MG  Capsule, Oral) Active. TraMADol HCl (50MG  Tablet, Oral as needed) Active. Apple Cider Vinegar (188MG  Capsule, Oral) Active. Meloxicam (15MG  Tablet, Oral) Active. Zofran (4MG  Tablet, Oral as needed) Active. Multivitamin Adult (Oral) Active. Myrbetriq (Oral)  Specific strength unknown - Active.  Pregnancy / Birth  History (Chanel Teressa Senter, CMA; 12/31/2019 3:08 PM) Age at menarche 26 years, 65 years.  Other Problems (Chanel Teressa Senter, CMA; 12/31/2019 3:08 PM) Diverticulosis     Review of Systems (Chanel Nolan CMA; 12/31/2019 3:08 PM) General Not Present- Appetite Loss, Chills, Fatigue, Fever, Night Sweats, Weight Gain and Weight Loss. Female Genitourinary Present- Frequency and Urgency. Not Present- Nocturia, Painful Urination and Pelvic Pain. Musculoskeletal Present- Swelling of Extremities. Not Present- Back Pain, Joint Pain, Joint Stiffness, Muscle Pain and Muscle Weakness.  Vitals (Chanel Nolan CMA; 12/31/2019 3:10 PM) 12/31/2019 3:10 PM Weight: 184.38 lb Height: 62in Body Surface Area: 1.85 m Body Mass Index: 33.72 kg/m  Temp.: 98.52F  Pulse: 102 (Regular)         Physical Exam (Jayko Voorhees A. Derrel Moore MD; 12/31/2019 5:20 PM)  General Mental Status-Alert. General Appearance-Consistent with stated age. Hydration-Well hydrated. Voice-Normal.  Head and Neck Head-normocephalic, atraumatic with no lesions or palpable masses. Trachea-midline. Thyroid Gland Characteristics - normal size and consistency.  Chest and Lung Exam Chest and lung exam reveals -quiet, even and easy respiratory effort with no use of accessory muscles and on auscultation, normal breath sounds, no adventitious sounds and normal vocal resonance. Inspection Chest Wall - Normal. Back - normal.  Breast Breast - Left-Symmetric, Non Tender, No Biopsy scars, no Dimpling - Left, No Inflammation, No Lumpectomy scars, No Mastectomy scars, No Peau d' Orange. Breast - Right-Symmetric, Non Tender, No Biopsy scars, no Dimpling - Right, No Inflammation, No Lumpectomy scars, No Mastectomy scars, No Peau d' Orange. Breast Lump-No Palpable Breast Mass.  Cardiovascular Cardiovascular examination reveals -normal heart sounds, regular rate and rhythm  with no murmurs and normal pedal pulses bilaterally.  Musculoskeletal Normal Exam - Left-Upper Extremity Strength Normal and Lower Extremity Strength Normal. Normal Exam - Right-Upper Extremity Strength Normal and Lower Extremity Strength Normal.  Lymphatic Head & Neck  General Head & Neck Lymphatics: Bilateral - Description - Normal. Axillary  General Axillary Region: Bilateral - Description - Normal. Tenderness - Non Tender.    Assessment & Plan (Naome Brigandi A. Saja Bartolini MD; 12/31/2019 5:21 PM)  PAPILLOMA OF RIGHT BREAST (D24.1) Impression: Recommend right breast needle localized lumpectomy given her strong family history of breast cancer. Risk of lumpectomy include bleeding, infection, seroma, more surgery, use of seed/wire, wound care, cosmetic deformity and the need for other treatments, death , blood clots, death. Pt agrees to proceed.  total time 40 minutes  Current Plans Pt Education - [facility] Breast Biopsy HCI: discussed with patient and provided information.  FAMILY HISTORY OF BREAST CANCER (Z80.3) Impression: Patient evaluated by genetics but never finished testing. Recommend completion of genetic testing. I have forwarded a note to the geneticist to follow up with her.

## 2020-01-01 ENCOUNTER — Telehealth: Payer: Self-pay | Admitting: Licensed Clinical Social Worker

## 2020-01-01 NOTE — Telephone Encounter (Signed)
Left voicemail for patient about sending a new genetic testing kit out. Asked for a call back so I can confirm the address to send it to.

## 2020-01-03 ENCOUNTER — Other Ambulatory Visit: Payer: Self-pay | Admitting: Surgery

## 2020-01-03 DIAGNOSIS — D241 Benign neoplasm of right breast: Secondary | ICD-10-CM

## 2020-01-07 ENCOUNTER — Other Ambulatory Visit: Payer: Self-pay

## 2020-01-07 NOTE — Progress Notes (Signed)
This visit occurred during the SARS-CoV-2 public health emergency.  Safety protocols were in place, including screening questions prior to the visit, additional usage of staff PPE, and extensive cleaning of exam room while observing appropriate contact time as indicated for disinfecting solutions.    Chief Complaint  Patient presents with  . Hematuria    Painful urination    HPI: Ashley Moore 69 y.o. come in  Problem based visit  Has poor bladder control and stream  To have scope  Tomorrow  To have cystoscopy. BUt developed dysuria inc frequency and hematuria   4  Days ago with .  No fever  Reports  Chills  From fatigue  Having nocturia  Every few hours   Has had covid vaccine  months .    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   NP  [facility] Patient Instructions [name], NP - 12/12/2019 2:43 PM EDT  Cysto with Dr Marcelline Mates and discuss fusion surgery Continue nightly vag estrogen Try dilator use nightly for 80min each session as long as comfortable Vesicare too expensive Stop Oxybutynin- failed Start myrbetriq 25mg  daily Time void every 2-3 hours in daytime 2 cups fluid at each meal, 1 cup between, and nothing 2-3 hours before bed Avoid bladder irritants- caffeine, dark colored soda, tea, coffee, artificial sugars, acidic juices, carbonation  ROS: See pertinent positives and negatives per HPI.  Past Medical History:  Diagnosis Date  . Bowen's disease    excised 1992  . Family history of breast cancer   . Family history of lung cancer   . Family history of non-Hodgkin's lymphoma   . Family history of ovarian cancer   . Headache   . Hx of varicella   . 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([place])   . Thyroid cancer ([place])     Family History  Problem Relation Age of Onset  . Lung cancer Mother        lung  . Breast cancer Mother 28  . Ovarian cancer Mother 62  . Pancreatitis Father   . Breast cancer  Sister 61  . Breast cancer Maternal Grandmother 76       breast   . Breast cancer Maternal Aunt 75       breast  . Melanoma Sister   . Breast cancer Sister 41  . Non-Hodgkin's lymphoma Paternal Grandmother     Social History   Socioeconomic History  . Marital status: Widowed    Spouse name: Not on file  . Number of children: 0  . Years of education: 36  . Highest education level: Associate degree: academic program  Occupational History  . 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 Smoking status: Former Research scientist (life sciences)  . Smokeless tobacco: Former Systems developer    Quit date: 09/20/1995  Substance and Sexual Activity  . Alcohol use: Yes    Alcohol/week: 0.0 standard drinks    Comment: occasional  . Drug use: Yes    Types: Marijuana  . Sexual activity: Never  Other Topics Concern  . Not on file  Social History Narrative   H H  of 1      5 pets.   She is a former smoker   Retired Programmer, applications; Conservator, museum/gallery   etoh   Red wine  1 per night.    Tea  green tea and earl gray    Moved from [place] to [place] area in December 15, 1983   1 pregnancy   Husband died spring  2016 cv   Sister died 03-17-2015  Bone cancer    3 remaining sisters            Social Determinants of Health   Financial Resource Strain: Low Risk   . Difficulty of Paying Living Expenses: Not very hard  Food Insecurity: No Food Insecurity  . Worried About Charity fundraiser in the Last Year: Never true  . Ran Out of Food in the Last Year: Never true  Transportation Needs: No Transportation Needs  . Lack of Transportation (Medical): No  . Lack of Transportation (Non-Medical): No  Physical Activity: Insufficiently Active  . Days of Exercise per Week: 4 days  . Minutes of Exercise per Session: 30 min  Stress: Stress Concern Present  . Feeling of Stress : To some extent  Social Connections: Somewhat Isolated  . Frequency of  Communication with Friends and Family: More than three times a week  . Frequency of Social Gatherings with Friends and Family: Not on file  . Attends Religious Services: Never  . Active Member of Clubs or Organizations: Yes  . Attends Archivist Meetings: Never  . Marital Status: Widowed    Outpatient Medications Prior to Visit  Medication Sig Dispense Refill  . cholecalciferol (VITAMIN D3) 25 MCG (1000 UT) tablet Take 5,000 Units by mouth daily.    Marland Kitchen conjugated estrogens (PREMARIN) vaginal cream Place 0.5 Applicatorfuls vaginally at bedtime.     . meloxicam (MOBIC) 15 MG tablet TAKE 1 TABLET EVERY DAY  ( APPOINTMENT IS NEEDED  ) 90 tablet 0  . mirabegron ER (MYRBETRIQ) 25 MG TB24 tablet Take by mouth.    . Multiple Vitamin (MULTIVITAMIN) tablet Take 1 tablet by mouth daily.    Marland Kitchen tiZANidine (ZANAFLEX) 4 MG tablet TAKE 1 TABLET EVERY 8 HOURS AS NEEDED FOR MUSCLE SPASM(S) (Patient taking differently: 6 mg. ) 90 tablet 5  . Turmeric 500 MG CAPS Take 1 capsule by mouth daily.    . solifenacin (VESICARE) 5 MG tablet Take by mouth daily.      No facility-administered medications prior to visit.     EXAM:  BP 120/68   Pulse 76   Temp 98.2 F (36.8 C) (Temporal)   Ht 5\' 2"  (1.575 m)   Wt 183 lb 12.8 oz (83.4 kg)   SpO2 98%   BMI 33.62 kg/m   Body mass index is 33.62 kg/m.  GENERAL: vitals reviewed and listed above, alert, oriented, appears well hydrated and in no acute distress HEENT: atraumatic, conjunctiva  clear, no obvious abnormalities on inspection of external nose and ears OP : masked  NECK: no obvious masses on inspection palpation   CV: HRRR, no clubbing cyanosis or  peripheral edema nl cap refill  Abdomen:  Sof,t normal bowel sounds without hepatosplenomegaly, no guarding rebound or masses no CVA tenderness MS: moves all extremities without noticeable focal  abnormality PSYCH: pleasant and cooperative, no obvious depression or anxiety Lab Results  Component  Value Date   WBC 4.7 11/07/2017   HGB 12.7 11/07/2017   HCT 38.8 11/07/2017   PLT 244.0 11/07/2017   GLUCOSE 77 11/07/2017   CHOL 182 11/07/2017   TRIG 87.0 11/07/2017   HDL 79.90 11/07/2017   LDLCALC 85 11/07/2017   ALT 31 11/07/2017   AST 39 (H) 11/07/2017   NA 138 11/07/2017  K 4.1 11/07/2017   CL 103 11/07/2017   CREATININE 1.00 06/27/2019   BUN 23 06/27/2019   CO2 30 11/07/2017   TSH 6.11 (H) 11/07/2017   HGBA1C 5.5 05/11/2015   BP Readings from Last 3 Encounters:  01/08/20 120/68  09/25/19 110/72  07/03/19 (!) 143/90   Urinalysis    Component Value Date/Time   COLORURINE YELLOW 05/05/2015 1958   APPEARANCEUR CLOUDY (A) 05/05/2015 1958   LABSPEC 1.010 02/14/2017 1604   PHURINE 6.0 02/14/2017 1604   PHURINE 6.0 05/05/2015 1958   GLUCOSEU Negative 02/14/2017 1604   HGBUR Negative 02/14/2017 [place] NEGATIVE 05/05/2015 1958   BILIRUBINUR Negative 01/08/2020 0949   BILIRUBINUR Negative 02/14/2017 1604   KETONESUR Negative 02/14/2017 1604   KETONESUR >80 (A) 05/05/2015 1958   PROTEINUR Positive (A) 01/08/2020 0949   PROTEINUR Negative 02/14/2017 1604   PROTEINUR NEGATIVE 05/05/2015 1958   UROBILINOGEN 0.2 01/08/2020 0949   UROBILINOGEN 0.2 02/14/2017 1604   NITRITE Negative 01/08/2020 0949   NITRITE Negative 02/14/2017 1604   NITRITE NEGATIVE 05/05/2015 1958   LEUKOCYTESUR Large (3+) (A) 01/08/2020 0949   LEUKOCYTESUR Small 02/14/2017 1604     ASSESSMENT AND PLAN:  Discussed the following assessment and plan:  Suspected UTI  Hematuria, unspecified type - Plan: POCT urinalysis dipstick, Culture, Urine  Abnormal thyroid blood test - Plan: Thyroid antibodies, Lipid panel, Hepatic function panel, CBC with Differential/Platelet, Basic metabolic panel, T4, free, TSH  Medication management - Plan: Thyroid antibodies, Lipid panel, Hepatic function panel, CBC with Differential/Platelet, Basic metabolic panel, T4, free, TSH  Anemia, unspecified type -  Plan: Thyroid antibodies, Lipid panel, Hepatic function panel, CBC with Differential/Platelet, Basic metabolic panel, T4, free, TSH  History of rectal cancer - Plan: Thyroid antibodies, Lipid panel, Hepatic function panel, CBC with Differential/Platelet, Basic metabolic panel, T4, free, TSH  Anal cancer (HCC) - Plan: Thyroid antibodies, Lipid panel, Hepatic function panel, CBC with Differential/Platelet, Basic metabolic panel, T4, free, TSH  Abnormal LFTs - Plan: Thyroid antibodies, Lipid panel, Hepatic function panel, CBC with Differential/Platelet, Basic metabolic panel, T4, free, TSH  Goiter - Plan: Thyroid antibodies, Lipid panel, Hepatic function panel, CBC with Differential/Platelet, Basic metabolic panel, T4, free, TSH Noted that is over due for labs  From prev  Notes  July 2020  She is non fasting but here today   Check for convenience   Contact urology to delay cysto cause of suspected uti  Empiric septra  And  Await culture  This is a cc ( not cath so cx may not be representative but sx are classic for cystitis .  -Patient advised to return or notify health care team  if  new concerns arise. In interim   Patient Instructions  Treating for  UTI will send info to your urology team  But you should contact them too.   Get future labs overdue while here.  Hope you feel better  Soon.   Standley Brooking. Lurene Robley M.D. Outside external source  DATA REVIEWED:  uro  Breast  Dx   Total time on date  of service including record review ordering and plan of care:  32 minutes

## 2020-01-08 ENCOUNTER — Encounter: Payer: Self-pay | Admitting: Internal Medicine

## 2020-01-08 ENCOUNTER — Ambulatory Visit (INDEPENDENT_AMBULATORY_CARE_PROVIDER_SITE_OTHER): Payer: Medicare HMO | Admitting: Internal Medicine

## 2020-01-08 ENCOUNTER — Other Ambulatory Visit: Payer: Self-pay

## 2020-01-08 VITALS — BP 120/68 | HR 76 | Temp 98.2°F | Ht 62.0 in | Wt 183.8 lb

## 2020-01-08 DIAGNOSIS — R319 Hematuria, unspecified: Secondary | ICD-10-CM | POA: Diagnosis not present

## 2020-01-08 DIAGNOSIS — Z79899 Other long term (current) drug therapy: Secondary | ICD-10-CM | POA: Diagnosis not present

## 2020-01-08 DIAGNOSIS — C210 Malignant neoplasm of anus, unspecified: Secondary | ICD-10-CM | POA: Diagnosis not present

## 2020-01-08 DIAGNOSIS — Z85048 Personal history of other malignant neoplasm of rectum, rectosigmoid junction, and anus: Secondary | ICD-10-CM | POA: Diagnosis not present

## 2020-01-08 DIAGNOSIS — R7989 Other specified abnormal findings of blood chemistry: Secondary | ICD-10-CM

## 2020-01-08 DIAGNOSIS — D649 Anemia, unspecified: Secondary | ICD-10-CM | POA: Diagnosis not present

## 2020-01-08 DIAGNOSIS — E049 Nontoxic goiter, unspecified: Secondary | ICD-10-CM

## 2020-01-08 DIAGNOSIS — R945 Abnormal results of liver function studies: Secondary | ICD-10-CM

## 2020-01-08 DIAGNOSIS — R3989 Other symptoms and signs involving the genitourinary system: Secondary | ICD-10-CM

## 2020-01-08 LAB — BASIC METABOLIC PANEL
BUN: 15 mg/dL (ref 6–23)
CO2: 28 mEq/L (ref 19–32)
Calcium: 9 mg/dL (ref 8.4–10.5)
Chloride: 100 mEq/L (ref 96–112)
Creatinine, Ser: 0.71 mg/dL (ref 0.40–1.20)
GFR: 81.29 mL/min (ref >60.00)
Glucose, Bld: 87 mg/dL (ref 70–99)
Potassium: 5.1 mEq/L (ref 3.5–5.1)
Sodium: 134 mEq/L — ABNORMAL LOW (ref 135–145)

## 2020-01-08 LAB — POCT URINALYSIS DIPSTICK
Bilirubin, UA: NEGATIVE
Blood, UA: POSITIVE
Glucose, UA: NEGATIVE
Ketones, UA: NEGATIVE
Nitrite, UA: NEGATIVE
Protein, UA: POSITIVE — AB
Spec Grav, UA: 1.01 (ref 1.010–1.025)
Urobilinogen, UA: 0.2 E.U./dL
pH, UA: 6 (ref 5.0–8.0)

## 2020-01-08 LAB — LIPID PANEL
Cholesterol: 153 mg/dL (ref 0–200)
HDL: 76.3 mg/dL (ref >39.00)
LDL Cholesterol: 63 mg/dL (ref 0–99)
NonHDL: 76.53
Total CHOL/HDL Ratio: 2
Triglycerides: 66 mg/dL (ref 0.0–149.0)
VLDL: 13.2 mg/dL (ref 0.0–40.0)

## 2020-01-08 LAB — HEPATIC FUNCTION PANEL
ALT: 13 U/L (ref 0–35)
AST: 26 U/L (ref 0–37)
Albumin: 3.6 g/dL (ref 3.5–5.2)
Alkaline Phosphatase: 73 U/L (ref 39–117)
Bilirubin, Direct: 0.1 mg/dL (ref 0.0–0.3)
Total Bilirubin: 0.3 mg/dL (ref 0.2–1.2)
Total Protein: 6.6 g/dL (ref 6.0–8.3)

## 2020-01-08 LAB — TSH: TSH: 7.46 u[IU]/mL — ABNORMAL HIGH (ref 0.35–4.50)

## 2020-01-08 LAB — T4, FREE: Free T4: 0.75 ng/dL (ref 0.60–1.60)

## 2020-01-08 MED ORDER — SULFAMETHOXAZOLE-TRIMETHOPRIM 800-160 MG PO TABS: 1.0000 | ORAL_TABLET | Freq: Two times a day (BID) | ORAL | 0 refills | Status: DC

## 2020-01-08 NOTE — Patient Instructions (Signed)
Treating for  UTI will send info to your urology team  But you should contact them too.   Get future labs overdue while here.  Hope you feel better  Soon.

## 2020-01-09 DIAGNOSIS — Z807 Family history of other malignant neoplasms of lymphoid, hematopoietic and related tissues: Secondary | ICD-10-CM | POA: Diagnosis not present

## 2020-01-09 DIAGNOSIS — Z8041 Family history of malignant neoplasm of ovary: Secondary | ICD-10-CM | POA: Diagnosis not present

## 2020-01-09 DIAGNOSIS — Z801 Family history of malignant neoplasm of trachea, bronchus and lung: Secondary | ICD-10-CM | POA: Diagnosis not present

## 2020-01-09 DIAGNOSIS — Z803 Family history of malignant neoplasm of breast: Secondary | ICD-10-CM | POA: Diagnosis not present

## 2020-01-09 DIAGNOSIS — C210 Malignant neoplasm of anus, unspecified: Secondary | ICD-10-CM | POA: Diagnosis not present

## 2020-01-09 LAB — CBC WITH DIFFERENTIAL/PLATELET
Basophils Absolute: 0 10*3/uL (ref 0.0–0.1)
Basophils Relative: 0.8 % (ref 0.0–3.0)
Eosinophils Absolute: 0.2 10*3/uL (ref 0.0–0.7)
Eosinophils Relative: 3.5 % (ref 0.0–5.0)
HCT: 36.1 % (ref 36.0–46.0)
Hemoglobin: 11.8 g/dL — ABNORMAL LOW (ref 12.0–15.0)
Lymphocytes Relative: 20.3 % (ref 12.0–46.0)
Lymphs Abs: 1.1 10*3/uL (ref 0.7–4.0)
MCHC: 32.7 g/dL (ref 30.0–36.0)
MCV: 85.9 fl (ref 78.0–100.0)
Monocytes Absolute: 0.6 10*3/uL (ref 0.1–1.0)
Monocytes Relative: 11.4 % (ref 3.0–12.0)
Neutro Abs: 3.3 10*3/uL (ref 1.4–7.7)
Neutrophils Relative %: 64 % (ref 43.0–77.0)
Platelets: 200 10*3/uL (ref 150.0–400.0)
RBC: 4.2 Mil/uL (ref 3.87–5.11)
RDW: 14.6 % (ref 11.5–15.5)
WBC: 5.2 10*3/uL (ref 4.0–10.5)

## 2020-01-09 LAB — THYROID ANTIBODIES
Thyroglobulin Ab: <1 IU/mL (ref <=1)
Thyroperoxidase Ab SerPl-aCnc: 1 IU/mL (ref <9)

## 2020-01-10 ENCOUNTER — Other Ambulatory Visit: Payer: Self-pay

## 2020-01-10 DIAGNOSIS — R7989 Other specified abnormal findings of blood chemistry: Secondary | ICD-10-CM

## 2020-01-10 LAB — URINE CULTURE
MICRO NUMBER:: 10389858
SPECIMEN QUALITY:: ADEQUATE

## 2020-01-10 NOTE — Progress Notes (Signed)
Results are stable  ok except  thryoid is still off consistent with subclinical  hypothyroid  (t4 nl and tsh elevated ) May or  may not cause a symptom .  Not alarming  but you may benefit from adding  thyroid medication.  If  agrees please send in 50 mcg of levothyroxine  take 1 po qd disp 90 refill x 1  Check TSH and free T4 in 2-3 months  ( dont have to fast)

## 2020-01-10 NOTE — Telephone Encounter (Signed)
Please see result note  For plan  . Make sure not to worry this should  be pretty easy to manage.

## 2020-01-13 ENCOUNTER — Other Ambulatory Visit: Payer: Self-pay

## 2020-01-13 DIAGNOSIS — R7989 Other specified abnormal findings of blood chemistry: Secondary | ICD-10-CM

## 2020-01-13 MED ORDER — LEVOTHYROXINE SODIUM 50 MCG PO TABS
50.0000 ug | ORAL_TABLET | Freq: Every day | ORAL | 1 refills | Status: DC
Start: 2020-01-13 — End: 2020-05-21

## 2020-01-13 NOTE — Progress Notes (Signed)
Tell patient that urine culture shows e coli  sensitive to medication given . Should resolve with current treatment .FU if not better. 

## 2020-01-16 DIAGNOSIS — Q525 Fusion of labia: Secondary | ICD-10-CM | POA: Diagnosis not present

## 2020-01-16 DIAGNOSIS — N3281 Overactive bladder: Secondary | ICD-10-CM | POA: Diagnosis not present

## 2020-01-23 ENCOUNTER — Other Ambulatory Visit: Payer: Self-pay | Admitting: Neurology

## 2020-01-27 ENCOUNTER — Encounter: Payer: Self-pay | Admitting: Licensed Clinical Social Worker

## 2020-01-27 ENCOUNTER — Ambulatory Visit: Payer: Self-pay | Admitting: Licensed Clinical Social Worker

## 2020-01-27 ENCOUNTER — Ambulatory Visit (HOSPITAL_COMMUNITY)
Admission: EM | Admit: 2020-01-27 | Discharge: 2020-01-28 | Disposition: A | Discharge status: Home / Self Care | Payer: Medicare HMO | Attending: Urology | Admitting: Urology

## 2020-01-27 ENCOUNTER — Telehealth: Payer: Self-pay | Admitting: Licensed Clinical Social Worker

## 2020-01-27 DIAGNOSIS — Z79899 Other long term (current) drug therapy: Secondary | ICD-10-CM | POA: Insufficient documentation

## 2020-01-27 DIAGNOSIS — Z888 Allergy status to other drugs, medicaments and biological substances status: Secondary | ICD-10-CM | POA: Diagnosis not present

## 2020-01-27 DIAGNOSIS — Z20822 Contact with and (suspected) exposure to covid-19: Secondary | ICD-10-CM | POA: Insufficient documentation

## 2020-01-27 DIAGNOSIS — R590 Localized enlarged lymph nodes: Secondary | ICD-10-CM | POA: Diagnosis not present

## 2020-01-27 DIAGNOSIS — Z79890 Hormone replacement therapy (postmenopausal): Secondary | ICD-10-CM | POA: Insufficient documentation

## 2020-01-27 DIAGNOSIS — K5730 Diverticulosis of large intestine without perforation or abscess without bleeding: Secondary | ICD-10-CM | POA: Diagnosis not present

## 2020-01-27 DIAGNOSIS — R109 Unspecified abdominal pain: Secondary | ICD-10-CM | POA: Diagnosis not present

## 2020-01-27 DIAGNOSIS — Z803 Family history of malignant neoplasm of breast: Secondary | ICD-10-CM

## 2020-01-27 DIAGNOSIS — I10 Essential (primary) hypertension: Secondary | ICD-10-CM | POA: Diagnosis not present

## 2020-01-27 DIAGNOSIS — Z87891 Personal history of nicotine dependence: Secondary | ICD-10-CM | POA: Insufficient documentation

## 2020-01-27 DIAGNOSIS — N1330 Unspecified hydronephrosis: Secondary | ICD-10-CM | POA: Diagnosis not present

## 2020-01-27 DIAGNOSIS — Z85048 Personal history of other malignant neoplasm of rectum, rectosigmoid junction, and anus: Secondary | ICD-10-CM | POA: Diagnosis not present

## 2020-01-27 DIAGNOSIS — Z880 Allergy status to penicillin: Secondary | ICD-10-CM | POA: Insufficient documentation

## 2020-01-27 DIAGNOSIS — R1111 Vomiting without nausea: Secondary | ICD-10-CM | POA: Diagnosis not present

## 2020-01-27 DIAGNOSIS — Z1379 Encounter for other screening for genetic and chromosomal anomalies: Secondary | ICD-10-CM

## 2020-01-27 DIAGNOSIS — Z03818 Encounter for observation for suspected exposure to other biological agents ruled out: Secondary | ICD-10-CM | POA: Diagnosis not present

## 2020-01-27 DIAGNOSIS — C210 Malignant neoplasm of anus, unspecified: Secondary | ICD-10-CM

## 2020-01-27 DIAGNOSIS — R52 Pain, unspecified: Secondary | ICD-10-CM | POA: Diagnosis not present

## 2020-01-27 DIAGNOSIS — N131 Hydronephrosis with ureteral stricture, not elsewhere classified: Secondary | ICD-10-CM | POA: Insufficient documentation

## 2020-01-27 DIAGNOSIS — I700 Atherosclerosis of aorta: Secondary | ICD-10-CM | POA: Diagnosis not present

## 2020-01-27 DIAGNOSIS — Z8041 Family history of malignant neoplasm of ovary: Secondary | ICD-10-CM

## 2020-01-27 DIAGNOSIS — Z807 Family history of other malignant neoplasms of lymphoid, hematopoietic and related tissues: Secondary | ICD-10-CM

## 2020-01-27 DIAGNOSIS — R000 Tachycardia, unspecified: Secondary | ICD-10-CM | POA: Diagnosis not present

## 2020-01-27 DIAGNOSIS — Z885 Allergy status to narcotic agent status: Secondary | ICD-10-CM | POA: Insufficient documentation

## 2020-01-27 DIAGNOSIS — R0902 Hypoxemia: Secondary | ICD-10-CM | POA: Diagnosis not present

## 2020-01-27 DIAGNOSIS — Z801 Family history of malignant neoplasm of trachea, bronchus and lung: Secondary | ICD-10-CM

## 2020-01-27 NOTE — ED Triage Notes (Signed)
Pt from EMS for sudden onset of R side flank and lower back pain. Hx of kidney stones. Pt screaming, restless in wheelchair. EMS gave4mg  morphine enroute without improvement.

## 2020-01-27 NOTE — Telephone Encounter (Signed)
Revealed negative genetic testing.  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  We discussed that we do not know why she has had  cancer or why there is cancer in the family. It could be due to a different gene that we are not testing, or something our current technology cannot pick up.  It will be important for her to keep in contact with genetics to learn if additional testing may be needed in the future. Patient requested that kidney cancer genes added to her test. We will call her when this reports out.

## 2020-01-27 NOTE — Progress Notes (Signed)
HPI:  Ms. Alix was previously seen in the [facility] clinic due to a personal and family history of cancer and concerns regarding a hereditary predisposition to cancer. Please refer to our prior cancer genetics clinic note for more information regarding our discussion, assessment and recommendations, at the time. Ms. Kennebrew recent genetic test results were disclosed to her, as were recommendations warranted by these results. These results and recommendations are discussed in more detail below.  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  Radiation: 11/14/16-12/23/16: 54 Gy to the pelvic nodes, anus, and bilateral groins.  At diagnosis, she had advanced locoregional disease with bilateral inguinal adenopathy, PET positive presacral, left pelvic sidewall, and a lesion of the left labia consistent with a satellite lesion. She was treated with 5FU and radiotherapy and developed significant desquamation of the tissue. She has been seen due to labial agglutination by Dr. Myrene Galas and continues with premarin daily for this. She also is being followed by Dr. Quentin Cornwall in [place] for her anoscopy.    Anal cancer ([place])  11/03/2016 Initial Diagnosis   Anal cancer ([place])   06/27/2019 Imaging   CT C/A/P - No evidence of recurrent or metastatic carcinoma.   Mild diffuse bladder wall thickening consistent with cystitis, likely due to prior radiation therapy. Colonic diverticulosis, without radiographic evidence of diverticulitis. Aortic and coronary artery atherosclerosis.     FAMILY HISTORY:  We obtained a detailed, 4-generation family history.  Significant diagnoses are listed below: Family History  Problem Relation Age of Onset  . Lung cancer Mother        lung  . Breast cancer Mother 58  . Ovarian cancer Mother 53  . Pancreatitis Father   . Breast cancer Sister 54  . Breast cancer Maternal Grandmother 2       breast     . Breast cancer Maternal Aunt 75       breast  . Melanoma Sister   . Breast cancer Sister 82  . Non-Hodgkin's lymphoma Paternal Grandmother    Ms. Lepkowski does not have children. She had 4 sisters. One passed away from metastatic breast cancer that was diagnosed at age 11. Another sister was also diagnosed with breast cancer at age 77. She reports both have had genetic testing, but it was many years ago, between 18 and 58. No cancers for her other two sisters. No cancers in her nieces/nephews.   Ms. Casali mother was diagnosed with breast cancer at 76 and ovarian cancer at 66, she had a TAH-BSO. She also had lung cancer, unspecified age. She died at 17. Ms. Glick had 3 maternal aunts, one maternal uncle. One of her aunts had breast cancer at 60, died at 65. No cancers in her maternal cousins. Her maternal grandmother had breast cancer at 62, died at 60. Maternal grandfather died at 61.  Ms. Blizard father died at 58 due to pancreatitis. The patient had 2 paternal uncles, both deceased, neither had cancer she is aware of. Limited history on this side. No known cancers in her paternal cousins. Her paternal grandmother had Non Hodgkins Lymphoma at died at 8, paternal grandfather died "young" of a heart attack.  Ms. Favela is aware of previous family history of genetic testing for hereditary cancer risks. Patient's maternal ancestors are of [place], [place], [place] descent, and paternal ancestors are of [place], [place], [place] descent. There is no reported Ashkenazi Jewish ancestry. There is no known consanguinity.  GENETIC TEST RESULTS: Genetic testing reported out on 01/27/2020 through the [facility] Common Hereditary Cancers Panel + Renal cancer panel found no pathogenic mutations. The Common Hereditary Cancers Panel offered by [facility] includes sequencing and/or deletion duplication testing of the following 48 genes: APC, ATM, AXIN2, BARD1, BMPR1A, BRCA1, BRCA2, BRIP1, CDH1, CDKN2A (p14ARF),  CDKN2A (p16INK4a), CKD4, CHEK2, CTNNA1, DICER1, EPCAM (Deletion/duplication testing only), GREM1 (promoter region deletion/duplication testing only), KIT, MEN1, MLH1, MSH2, MSH3, MSH6, MUTYH, NBN, NF1, NHTL1, PALB2, PDGFRA, PMS2, POLD1, POLE, PTEN, RAD50, RAD51C, RAD51D, RNF43, SDHB, SDHC, SDHD, SMAD4, SMARCA4. STK11, TP53, TSC1, TSC2, and VHL.  The following genes were evaluated for sequence changes only: SDHA and HOXB13 c.251G>A variant only. The [facility] Renal/Urinary Tract Cancers Panel analyzes the following 24 genes:BAP1 ,CDC73, CDKN1C, DICER1, DIS3L2, EPCAM, FH, FLCN, GPC3, MET, MLH1, MSH2, MSH6, PMS2, PTEN, SDHB, SDHC, SMARCA4, SMARCB1, TP53, TSC1, TSC2, VHL, WT1. The test report has been scanned into EPIC and is located under the Molecular Pathology section of the Results Review tab.  A portion of the result report is included below for reference.     We discussed with Ms. Hurlock that because current genetic testing is not perfect, it is possible there may be a gene mutation in one of these genes that current testing cannot detect, but that chance is small.  We also discussed, that there could be another gene that has not yet been discovered, or that we have not yet tested, that is responsible for the cancer diagnoses in the family. It is also possible there is a hereditary cause for the cancer in the family that Ms. Rye did not inherit and therefore was not identified in her testing.  Therefore, it is important to remain in touch with cancer genetics in the future so that we can continue to offer Ms. Krogh the most up to date genetic testing.   Genetic testing did identify 2 Variants of uncertain significance (VUS) - one in the CHEK2 gene called c.1525C>T and a second in the [facility] gene called c.427C>T. At this time, it is unknown if these variants are associated with increased cancer risk or if they are normal findings, but most variants such as these get reclassified to being  inconsequential. They should not be used to make medical management decisions. With time, we suspect the lab will determine the significance of these variants, if any. If we do learn more about them, we will try to contact Ms. Pitzer to discuss it further. However, it is important to stay in touch with [place] periodically and keep the address and phone number up to date.  ADDITIONAL GENETIC TESTING: We discussed with Ms. Mione that her genetic testing was fairly extensive.  If there are genes identified to increase cancer risk that can be analyzed in the future, we would be happy to discuss and coordinate this testing at that time.    CANCER SCREENING RECOMMENDATIONS: Ms. Milos test result is considered negative (normal).  This means that we have not identified a hereditary cause for her  personal and family history of cancer at this time. Most cancers happen by chance and this negative test suggests that her cancer may fall into this category.    While reassuring, this does not definitively rule out a hereditary predisposition to cancer. It is still possible that there could be genetic mutations that are undetectable by current technology. There could be genetic mutations in genes that have not been tested or identified to increase cancer risk.  Therefore, it is  recommended she continue to follow the cancer management and screening guidelines provided by her oncology and primary healthcare provider.   An individual's cancer risk and medical management are not determined by genetic test results alone. Overall cancer risk assessment incorporates additional factors, including personal medical history, family history, and any available genetic information that may result in a personalized plan for cancer prevention and surveillance.  RECOMMENDATIONS FOR FAMILY MEMBERS:  Relatives in this family might be at some increased risk of developing cancer, over the general population risk, simply due to the family  history of cancer.  We recommended female relatives in this family have a yearly mammogram beginning at age 44, or 19 years younger than the earliest onset of cancer, an annual clinical breast exam, and perform monthly breast self-exams. Female relatives in this family should also have a gynecological exam as recommended by their primary provider. All family members should have a colonoscopy by age 50, or as directed by their physicians.   It is also possible there is a hereditary cause for the cancer in Ms. Freels family that she did not inherit and therefore was not identified in her.  Based on Ms. Puerto family history, we recommended maternal relatives have genetic counseling and testing. Ms. Haffey reports all of her sisters and her mother have had negative genetic testing.  Ms. Beery will let us know if we can be of any assistance in coordinating genetic counseling and/or testing for these family members.  FOLLOW-UP: Lastly, we discussed with Ms. Condie that cancer genetics is a rapidly advancing field and it is possible that new genetic tests will be appropriate for her and/or her family members in the future. We encouraged her to remain in contact with cancer genetics on an annual basis so we can update her personal and family histories and let her know of advances in cancer genetics that may benefit this family.   Our contact number was provided. Ms. Hefel questions were answered to her satisfaction, and she knows she is welcome to call us at anytime with additional questions or concerns.   Faith Rogue, MS, [facility] Genetic Counselor [email or web address] Phone: [phone]

## 2020-01-28 ENCOUNTER — Emergency Department (HOSPITAL_COMMUNITY): Payer: Medicare HMO

## 2020-01-28 ENCOUNTER — Encounter (HOSPITAL_COMMUNITY): Payer: Self-pay | Admitting: *Deleted

## 2020-01-28 ENCOUNTER — Other Ambulatory Visit: Payer: Self-pay

## 2020-01-28 ENCOUNTER — Emergency Department (HOSPITAL_COMMUNITY): Payer: Medicare HMO | Admitting: Certified Registered Nurse Anesthetist

## 2020-01-28 ENCOUNTER — Encounter (HOSPITAL_COMMUNITY)
Admission: EM | Disposition: A | Discharge status: Home / Self Care | Payer: Self-pay | Source: Home / Self Care | Attending: Emergency Medicine

## 2020-01-28 DIAGNOSIS — I700 Atherosclerosis of aorta: Secondary | ICD-10-CM | POA: Diagnosis not present

## 2020-01-28 DIAGNOSIS — K5730 Diverticulosis of large intestine without perforation or abscess without bleeding: Secondary | ICD-10-CM | POA: Diagnosis not present

## 2020-01-28 DIAGNOSIS — Z885 Allergy status to narcotic agent status: Secondary | ICD-10-CM | POA: Diagnosis not present

## 2020-01-28 DIAGNOSIS — N1339 Other hydronephrosis: Secondary | ICD-10-CM | POA: Diagnosis not present

## 2020-01-28 DIAGNOSIS — E559 Vitamin D deficiency, unspecified: Secondary | ICD-10-CM | POA: Diagnosis not present

## 2020-01-28 DIAGNOSIS — Z79890 Hormone replacement therapy (postmenopausal): Secondary | ICD-10-CM | POA: Diagnosis not present

## 2020-01-28 DIAGNOSIS — N131 Hydronephrosis with ureteral stricture, not elsewhere classified: Secondary | ICD-10-CM | POA: Diagnosis not present

## 2020-01-28 DIAGNOSIS — R1031 Right lower quadrant pain: Secondary | ICD-10-CM | POA: Diagnosis not present

## 2020-01-28 DIAGNOSIS — N1330 Unspecified hydronephrosis: Secondary | ICD-10-CM | POA: Diagnosis not present

## 2020-01-28 DIAGNOSIS — Z20822 Contact with and (suspected) exposure to covid-19: Secondary | ICD-10-CM | POA: Diagnosis not present

## 2020-01-28 DIAGNOSIS — Z85048 Personal history of other malignant neoplasm of rectum, rectosigmoid junction, and anus: Secondary | ICD-10-CM | POA: Diagnosis not present

## 2020-01-28 DIAGNOSIS — Z888 Allergy status to other drugs, medicaments and biological substances status: Secondary | ICD-10-CM | POA: Diagnosis not present

## 2020-01-28 DIAGNOSIS — N3592 Unspecified urethral stricture, female: Secondary | ICD-10-CM | POA: Diagnosis not present

## 2020-01-28 DIAGNOSIS — R109 Unspecified abdominal pain: Secondary | ICD-10-CM | POA: Diagnosis present

## 2020-01-28 DIAGNOSIS — Z880 Allergy status to penicillin: Secondary | ICD-10-CM | POA: Diagnosis not present

## 2020-01-28 DIAGNOSIS — R590 Localized enlarged lymph nodes: Secondary | ICD-10-CM | POA: Diagnosis not present

## 2020-01-28 DIAGNOSIS — Z87891 Personal history of nicotine dependence: Secondary | ICD-10-CM | POA: Diagnosis not present

## 2020-01-28 DIAGNOSIS — M8580 Other specified disorders of bone density and structure, unspecified site: Secondary | ICD-10-CM | POA: Diagnosis not present

## 2020-01-28 DIAGNOSIS — Z79899 Other long term (current) drug therapy: Secondary | ICD-10-CM | POA: Diagnosis not present

## 2020-01-28 HISTORY — PX: CYSTOSCOPY WITH RETROGRADE PYELOGRAM, URETEROSCOPY AND STENT PLACEMENT: SHX5789

## 2020-01-28 LAB — CBC WITH DIFFERENTIAL/PLATELET
Abs Immature Granulocytes: 0.01 10*3/uL (ref 0.00–0.07)
Basophils Absolute: 0 10*3/uL (ref 0.0–0.1)
Basophils Relative: 1 %
Eosinophils Absolute: 0.1 10*3/uL (ref 0.0–0.5)
Eosinophils Relative: 2 %
HCT: 38.4 % (ref 36.0–46.0)
Hemoglobin: 12.2 g/dL (ref 12.0–15.0)
Immature Granulocytes: 0 %
Lymphocytes Relative: 17 %
Lymphs Abs: 1.1 10*3/uL (ref 0.7–4.0)
MCH: 27.9 pg (ref 26.0–34.0)
MCHC: 31.8 g/dL (ref 30.0–36.0)
MCV: 87.7 fL (ref 80.0–100.0)
Monocytes Absolute: 0.5 10*3/uL (ref 0.1–1.0)
Monocytes Relative: 8 %
Neutro Abs: 4.5 10*3/uL (ref 1.7–7.7)
Neutrophils Relative %: 72 %
Platelets: 232 10*3/uL (ref 150–400)
RBC: 4.38 MIL/uL (ref 3.87–5.11)
RDW: 14.6 % (ref 11.5–15.5)
WBC: 6.2 10*3/uL (ref 4.0–10.5)
nRBC: 0 % (ref 0.0–0.2)

## 2020-01-28 LAB — COMPREHENSIVE METABOLIC PANEL
ALT: 24 U/L (ref 0–44)
AST: 35 U/L (ref 15–41)
Albumin: 3.8 g/dL (ref 3.5–5.0)
Alkaline Phosphatase: 68 U/L (ref 38–126)
Anion gap: 11 (ref 5–15)
BUN: 22 mg/dL (ref 8–23)
CO2: 23 mmol/L (ref 22–32)
Calcium: 9.6 mg/dL (ref 8.9–10.3)
Chloride: 105 mmol/L (ref 98–111)
Creatinine, Ser: 0.96 mg/dL (ref 0.44–1.00)
GFR calc Af Amer: >60 mL/min (ref >60)
GFR calc non Af Amer: 60 mL/min — ABNORMAL LOW (ref >60)
Glucose, Bld: 135 mg/dL — ABNORMAL HIGH (ref 70–99)
Potassium: 3.5 mmol/L (ref 3.5–5.1)
Sodium: 139 mmol/L (ref 135–145)
Total Bilirubin: 0.7 mg/dL (ref 0.3–1.2)
Total Protein: 7.6 g/dL (ref 6.5–8.1)

## 2020-01-28 LAB — LIPASE, BLOOD: Lipase: 28 U/L (ref 11–51)

## 2020-01-28 LAB — URINALYSIS, ROUTINE W REFLEX MICROSCOPIC
Bilirubin Urine: NEGATIVE
Glucose, UA: NEGATIVE mg/dL
Hgb urine dipstick: NEGATIVE
Ketones, ur: 20 mg/dL — AB
Nitrite: NEGATIVE
Protein, ur: NEGATIVE mg/dL
Specific Gravity, Urine: 1.011 (ref 1.005–1.030)
pH: 7 (ref 5.0–8.0)

## 2020-01-28 LAB — URINE CULTURE

## 2020-01-28 LAB — SARS CORONAVIRUS 2 BY RT PCR (HOSPITAL ORDER, PERFORMED IN ~~LOC~~ HOSPITAL LAB): SARS Coronavirus 2: NEGATIVE

## 2020-01-28 SURGERY — CYSTOURETEROSCOPY, WITH RETROGRADE PYELOGRAM AND STENT INSERTION
Anesthesia: General | Laterality: Right

## 2020-01-28 MED ORDER — MORPHINE SULFATE (PF) 4 MG/ML IV SOLN
6.0000 mg | Freq: Once | INTRAVENOUS | Status: AC
  Administered 2020-01-28: 04:00:00 6 mg via INTRAVENOUS
  Filled 2020-01-28: qty 2

## 2020-01-28 MED ORDER — PROPOFOL 10 MG/ML IV BOLUS
INTRAVENOUS | Status: AC
  Filled 2020-01-28: qty 20

## 2020-01-28 MED ORDER — MIDAZOLAM HCL 2 MG/2ML IJ SOLN
INTRAMUSCULAR | Status: DC | PRN
  Administered 2020-01-28: 2 mg via INTRAVENOUS

## 2020-01-28 MED ORDER — OXYCODONE-ACETAMINOPHEN 5-325 MG PO TABS
2.0000 | ORAL_TABLET | Freq: Once | ORAL | Status: AC
  Administered 2020-01-28: 02:00:00 2 via ORAL
  Filled 2020-01-28: qty 2

## 2020-01-28 MED ORDER — MIDAZOLAM HCL 2 MG/2ML IJ SOLN
INTRAMUSCULAR | Status: AC
  Filled 2020-01-28: qty 2

## 2020-01-28 MED ORDER — 0.9 % SODIUM CHLORIDE (POUR BTL) OPTIME
TOPICAL | Status: DC | PRN
  Administered 2020-01-28: 1000 mL

## 2020-01-28 MED ORDER — LACTATED RINGERS IV SOLN: INTRAVENOUS | Status: DC

## 2020-01-28 MED ORDER — MORPHINE SULFATE (PF) 4 MG/ML IV SOLN
4.0000 mg | Freq: Once | INTRAVENOUS | Status: AC
  Administered 2020-01-28: 4 mg via INTRAVENOUS
  Filled 2020-01-28: qty 1

## 2020-01-28 MED ORDER — FENTANYL CITRATE (PF) 100 MCG/2ML IJ SOLN: 25.0000 ug | INTRAMUSCULAR | Status: DC | PRN

## 2020-01-28 MED ORDER — ACETAMINOPHEN 10 MG/ML IV SOLN: 1000.0000 mg | Freq: Once | INTRAVENOUS | Status: DC | PRN

## 2020-01-28 MED ORDER — ONDANSETRON HCL 4 MG/2ML IJ SOLN: 4.0000 mg | Freq: Once | INTRAMUSCULAR | Status: DC | PRN

## 2020-01-28 MED ORDER — CIPROFLOXACIN IN D5W 400 MG/200ML IV SOLN
400.0000 mg | Freq: Once | INTRAVENOUS | Status: AC
  Administered 2020-01-28: 400 mg via INTRAVENOUS

## 2020-01-28 MED ORDER — IOHEXOL 300 MG/ML  SOLN
100.0000 mL | Freq: Once | INTRAMUSCULAR | Status: AC | PRN
  Administered 2020-01-28: 100 mL via INTRAVENOUS

## 2020-01-28 MED ORDER — SODIUM CHLORIDE 0.9 % IR SOLN
Status: DC | PRN
  Administered 2020-01-28: 3000 mL

## 2020-01-28 MED ORDER — ONDANSETRON HCL 4 MG/2ML IJ SOLN
INTRAMUSCULAR | Status: AC
  Filled 2020-01-28: qty 2

## 2020-01-28 MED ORDER — LIDOCAINE 2% (20 MG/ML) 5 ML SYRINGE
INTRAMUSCULAR | Status: DC | PRN
  Administered 2020-01-28: 100 mg via INTRAVENOUS

## 2020-01-28 MED ORDER — IOHEXOL 300 MG/ML  SOLN
INTRAMUSCULAR | Status: DC | PRN
  Administered 2020-01-28: 6 mL via ORAL

## 2020-01-28 MED ORDER — DEXAMETHASONE SODIUM PHOSPHATE 10 MG/ML IJ SOLN
INTRAMUSCULAR | Status: AC
  Filled 2020-01-28: qty 1

## 2020-01-28 MED ORDER — SODIUM CHLORIDE (PF) 0.9 % IJ SOLN
INTRAMUSCULAR | Status: AC
  Filled 2020-01-28: qty 50

## 2020-01-28 MED ORDER — ONDANSETRON HCL 4 MG/2ML IJ SOLN
4.0000 mg | Freq: Four times a day (QID) | INTRAMUSCULAR | Status: DC | PRN
  Administered 2020-01-28: 4 mg via INTRAVENOUS
  Filled 2020-01-28: qty 2

## 2020-01-28 MED ORDER — PROPOFOL 10 MG/ML IV BOLUS
INTRAVENOUS | Status: DC | PRN
  Administered 2020-01-28: 200 mg via INTRAVENOUS

## 2020-01-28 MED ORDER — KETOROLAC TROMETHAMINE 15 MG/ML IJ SOLN
15.0000 mg | Freq: Once | INTRAMUSCULAR | Status: AC
  Administered 2020-01-28: 15 mg via INTRAVENOUS
  Filled 2020-01-28: qty 1

## 2020-01-28 MED ORDER — LIDOCAINE 2% (20 MG/ML) 5 ML SYRINGE
INTRAMUSCULAR | Status: AC
  Filled 2020-01-28: qty 5

## 2020-01-28 MED ORDER — ONDANSETRON HCL 4 MG/2ML IJ SOLN
INTRAMUSCULAR | Status: DC | PRN
  Administered 2020-01-28: 4 mg via INTRAVENOUS

## 2020-01-28 MED ORDER — DEXAMETHASONE SODIUM PHOSPHATE 4 MG/ML IJ SOLN
INTRAMUSCULAR | Status: DC | PRN
  Administered 2020-01-28: 5 mg via INTRAVENOUS

## 2020-01-28 MED ORDER — CIPROFLOXACIN IN D5W 400 MG/200ML IV SOLN
INTRAVENOUS | Status: AC
  Filled 2020-01-28: qty 200

## 2020-01-28 MED ORDER — OXYCODONE-ACETAMINOPHEN 5-325 MG PO TABS: 1.0000 | ORAL_TABLET | Freq: Four times a day (QID) | ORAL | 0 refills | Status: DC | PRN

## 2020-01-28 SURGICAL SUPPLY — 21 items
BAG URO CATCHER STRL LF (MISCELLANEOUS) ×2 IMPLANT
BASKET LASER NITINOL 1.9FR (BASKET) IMPLANT
BSKT STON RTRVL 120 1.9FR (BASKET)
CATH INTERMIT  6FR 70CM (CATHETERS) ×2 IMPLANT
CLOTH BEACON ORANGE TIMEOUT ST (SAFETY) ×2 IMPLANT
EXTRACTOR STONE 1.7FRX115CM (UROLOGICAL SUPPLIES) IMPLANT
FIBER LASER FLEXIVA 365 (UROLOGICAL SUPPLIES) IMPLANT
FIBER LASER TRAC TIP (UROLOGICAL SUPPLIES) IMPLANT
GLOVE BIOGEL M STRL SZ7.5 (GLOVE) ×2 IMPLANT
GOWN STRL REUS W/TWL LRG LVL3 (GOWN DISPOSABLE) ×2 IMPLANT
GUIDEWIRE ANG ZIPWIRE 038X150 (WIRE) ×2 IMPLANT
GUIDEWIRE STR DUAL SENSOR (WIRE) ×2 IMPLANT
KIT TURNOVER KIT A (KITS) IMPLANT
MANIFOLD NEPTUNE II (INSTRUMENTS) ×2 IMPLANT
SHEATH URETERAL 12FRX28CM (UROLOGICAL SUPPLIES) IMPLANT
SHEATH URETERAL 12FRX35CM (MISCELLANEOUS) IMPLANT
STENT POLARIS LOOP 8FR X 24 CM (STENTS) ×1 IMPLANT
TRAY CYSTO PACK (CUSTOM PROCEDURE TRAY) ×2 IMPLANT
TUBE FEEDING 8FR 16IN STR KANG (MISCELLANEOUS) ×2 IMPLANT
TUBING CONNECTING 10 (TUBING) ×2 IMPLANT
TUBING UROLOGY SET (TUBING) ×2 IMPLANT

## 2020-01-28 NOTE — Discharge Instructions (Signed)
1 - You may have urinary urgency (bladder spasms) and bloody urine on / off with stent in place. This is normal. ° °2 - Call MD or go to ER for fever >102, severe pain / nausea / vomiting not relieved by medications, or acute change in medical status ° °

## 2020-01-28 NOTE — Transfer of Care (Signed)
Immediate Anesthesia Transfer of Care Note  Patient: Ashley Moore  Procedure(s) Performed: CYSTOSCOPY WITH RETROGRADE PYELOGRAM, URETEROSCOPY AND STENT PLACEMENT (Right )  Patient Location: PACU  Anesthesia Type:General  Level of Consciousness: awake and patient cooperative  Airway & Oxygen Therapy: Patient Spontanous Breathing and Patient connected to face mask  Post-op Assessment: Report given to RN and Post -op Vital signs reviewed and stable  Post vital signs: Reviewed and stable  Last Vitals:  Vitals Value Taken Time  BP 143/74 01/28/20 1645  Temp    Pulse    Resp 14 01/28/20 1645  SpO2    Vitals shown include unvalidated device data.  Last Pain:  Vitals:   01/28/20 1524  TempSrc:   PainSc: 0-No pain      Patients Stated Pain Goal: 3 ([id number])  Complications: No apparent anesthesia complications

## 2020-01-28 NOTE — ED Notes (Signed)
[facility] called to transport pt to [facility] 

## 2020-01-28 NOTE — H&P (Signed)
Ashley Moore is an 69 y.o. female.    Chief Complaint: Right malignant ureteral obstruction  HPI:   1 - Right Malignant Ureteral Obstruction - h/o advanced anal cancer s/p surgeyr, chemo with new retroperitonela adenopathy and Rt hydro on eval flank pain. CR 0.93, UA withotu over infectious parameters.  PMH sig for rectal cancer. NO CV disease / blood thinners. Her PCP is Teola Bradley MD. Her medical oncologist is Betsy Coder MD.   Today "Ashley Moore" is seen to proceed with RIGHT ureteral stent placement for malignant obstruction. C19 negative.   Past Medical History:  Diagnosis Date  . Bowen's disease    excised 1992  . Family history of breast cancer   . Family history of lung cancer   . Family history of non-Hodgkin's lymphoma   . Family history of ovarian cancer   . Headache   . Hx of varicella   . 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([place])   . Thyroid cancer [facility])     Past Surgical History:  Procedure Laterality Date  . BUNIONECTOMY Right   . IR GENERIC HISTORICAL  11/14/2016   IR US GUIDE VASC ACCESS RIGHT 11/14/2016 [facility]  . IR GENERIC HISTORICAL  11/14/2016   IR FLUORO GUIDE CV LINE RIGHT 11/14/2016 [facility]  . IR GENERIC HISTORICAL  12/12/2016   IR US GUIDE VASC ACCESS RIGHT 12/12/2016 [name], MD [facility]  . IR GENERIC HISTORICAL  12/12/2016   IR FLUORO GUIDE CV LINE RIGHT 12/12/2016 [name], MD [facility]  . SKIN CANCER EXCISION     bowens disease    Family History  Problem Relation Age of Onset  . Lung cancer Mother        lung  . Breast cancer Mother 61  . Ovarian cancer Mother 88  . Pancreatitis Father   . Breast cancer Sister 3  . Breast cancer Maternal Grandmother 55       breast   . Breast cancer Maternal Aunt 75       breast  . Melanoma Sister   . Breast cancer Sister 7  . Non-Hodgkin's lymphoma Paternal Grandmother    Social History:  reports that she has quit smoking. She quit smokeless tobacco use about 24 years ago. She reports  current alcohol use. She reports current drug use. Drug: Marijuana.  Allergies:  Allergies  Allergen Reactions  . Dilaudid [Hydromorphone Hcl]     Throws up  Cannot tolerate, IV or IM   . Benadryl [Diphenhydramine] Other (See Comments)    Tingle all over   . Fentanyl Other (See Comments)    Reports it makes her violent and projectile vomiting  . Melatonin     Tingle all over   . Lemon Oil Diarrhea  . Penicillins Rash    (Not in a hospital admission)   Results for orders placed or performed during the hospital encounter of 01/27/20 (from the past 48 hour(s))  CBC with Differential     Status: None   Collection Time: 01/28/20 12:04 AM  Result Value Ref Range   WBC 6.2 4.0 - 10.5 K/uL   RBC 4.38 3.87 - 5.11 MIL/uL   Hemoglobin 12.2 12.0 - 15.0 g/dL   HCT 38.4 36.0 - 46.0 %   MCV 87.7 80.0 - 100.0 fL   MCH 27.9 26.0 - 34.0 pg   MCHC 31.8 30.0 - 36.0 g/dL   RDW 14.6 11.5 - 15.5 %   Platelets 232 150 - 400 K/uL   nRBC 0.0 0.0 -  0.2 %   Neutrophils Relative % 72 %   Neutro Abs 4.5 1.7 - 7.7 K/uL   Lymphocytes Relative 17 %   Lymphs Abs 1.1 0.7 - 4.0 K/uL   Monocytes Relative 8 %   Monocytes Absolute 0.5 0.1 - 1.0 K/uL   Eosinophils Relative 2 %   Eosinophils Absolute 0.1 0.0 - 0.5 K/uL   Basophils Relative 1 %   Basophils Absolute 0.0 0.0 - 0.1 K/uL   Immature Granulocytes 0 %   Abs Immature Granulocytes 0.01 0.00 - 0.07 K/uL    Comment: Performed at [facility] [address] [place], [place] [address]  Comprehensive metabolic panel     Status: Abnormal   Collection Time: 01/28/20 12:04 AM  Result Value Ref Range   Sodium 139 135 - 145 mmol/L   Potassium 3.5 3.5 - 5.1 mmol/L   Chloride 105 98 - 111 mmol/L   CO2 23 22 - 32 mmol/L   Glucose, Bld 135 (H) 70 - 99 mg/dL    Comment: Glucose reference range applies only to samples taken after fasting for at least 8 hours.   BUN 22 8 - 23 mg/dL   Creatinine, Ser 0.96 0.44 - 1.00 mg/dL   Calcium 9.6 8.9 - 10.3  mg/dL   Total Protein 7.6 6.5 - 8.1 g/dL   Albumin 3.8 3.5 - 5.0 g/dL   AST 35 15 - 41 U/L   ALT 24 0 - 44 U/L   Alkaline Phosphatase 68 38 - 126 U/L   Total Bilirubin 0.7 0.3 - 1.2 mg/dL   GFR calc non Af Amer 60 (L) >60 mL/min   GFR calc Af Amer >60 >60 mL/min   Anion gap 11 5 - 15    Comment: Performed at [facility], [place] [address], [place], [place] [address]  Lipase, blood     Status: None   Collection Time: 01/28/20 12:04 AM  Result Value Ref Range   Lipase 28 11 - 51 U/L    Comment: Performed at [facility], [place] [address] [place], [place] [address]  Urinalysis, Routine w reflex microscopic     Status: Abnormal   Collection Time: 01/28/20  1:33 AM  Result Value Ref Range   Color, Urine YELLOW YELLOW   APPearance HAZY (A) CLEAR   Specific Gravity, Urine 1.011 1.005 - 1.030   pH 7.0 5.0 - 8.0   Glucose, UA NEGATIVE NEGATIVE mg/dL   Hgb urine dipstick NEGATIVE NEGATIVE   Bilirubin Urine NEGATIVE NEGATIVE   Ketones, ur 20 (A) NEGATIVE mg/dL   Protein, ur NEGATIVE NEGATIVE mg/dL   Nitrite NEGATIVE NEGATIVE   Leukocytes,Ua SMALL (A) NEGATIVE   RBC / HPF 6-10 0 - 5 RBC/hpf   WBC, UA 21-50 0 - 5 WBC/hpf   Bacteria, UA RARE (A) NONE SEEN   Squamous Epithelial / LPF 0-5 0 - 5   Mucus PRESENT     Comment: Performed at [facility], [place] [address] [place], [place] [address]  SARS Coronavirus 2 by RT PCR (hospital order, performed in [facility] hospital lab) Nasopharyngeal Nasopharyngeal Swab     Status: None   Collection Time: 01/28/20  2:33 AM   Specimen: Nasopharyngeal Swab  Result Value Ref Range   SARS Coronavirus 2 NEGATIVE NEGATIVE    Comment: (NOTE) SARS-CoV-2 target nucleic acids are NOT DETECTED. The SARS-CoV-2 RNA is generally detectable in upper and lower respiratory specimens during the acute phase of infection. The  lowest concentration of SARS-CoV-2 viral copies this assay can detect is 250 copies / mL. A negative result does not preclude  SARS-CoV-2 infection and should not be used as the sole basis for treatment or other patient management decisions.  A negative result may occur with improper specimen collection / handling, submission of specimen other than nasopharyngeal swab, presence of viral mutation(s) within the areas targeted by this assay, and inadequate number of viral copies (<250 copies / mL). A negative result must be combined with clinical observations, patient history, and epidemiological information. Fact Sheet for Patients:   StrictlyIdeas.no Fact Sheet for Healthcare Providers: BankingDealers.co.za This test is not yet approved or cleared  by the [place] FDA and has been authorized for detection and/or diagnosis of SARS-CoV-2 by FDA under an Emergency Use Authorization (EUA).  This EUA will remain in effect (meaning this test can be used) for the duration of the COVID-19 declaration under Section 564(b)(1) of the Act, 21 U.S.C. section 360bbb-3(b)(1), unless the authorization is terminated or revoked sooner. Performed at [facility], [place] [address], [place], [place] [address]    CT ABDOMEN PELVIS W CONTRAST  Result Date: 01/28/2020 CLINICAL DATA:  Flank pain with kidney stone suspected EXAM: CT ABDOMEN AND PELVIS WITH CONTRAST TECHNIQUE: Multidetector CT imaging of the abdomen and pelvis was performed using the standard protocol following bolus administration of intravenous contrast. CONTRAST:  130mL OMNIPAQUE IOHEXOL 300 MG/ML  SOLN COMPARISON:  Noncontrast CT from earlier today FINDINGS: Lower chest:  No contributory findings. Hepatobiliary: No focal liver abnormality.No evidence of biliary obstruction or stone. Pancreas: Unremarkable. Spleen: Unremarkable. Adrenals/Urinary Tract: Negative adrenals. Right renal expansion, hydroureteronephrosis, and delayed renal enhancement. There is extensive perinephric and periureteric edema on the right. A  transition is seen at the level of the mid right ureter where there is vague increased density of the ureter and retroperitoneal adenopathy. There is chronic mild thickening of the bladder in the setting of prior pelvic radiotherapy. Stomach/Bowel: No obstruction. History of anal cancer with detection of local recurrence limited by technique. There is mild haziness of fat around the lower rectum/anus which is likely treatment related. Left colonic diverticulosis. Vascular/Lymphatic: No acute vascular abnormality. Retroperitoneal lymphadenopathy. A node posterior to the cava and up lifting it measures 3.7 x 19 mm on axial slices. There is nodes anterior and right lateral to the lower cava, measuring up to 15 mm short axis and in the region of ureteral obstruction. Reproductive:Atrophic uterus, expected Other: No ascites or pneumoperitoneum. Musculoskeletal: Bilateral hip osteoarthritis. Lumbar spine degeneration with mild scoliosis. IMPRESSION: Retroperitoneal adenopathy, likely metastatic disease from the patient's rectal cancer, which is associated with the right hydronephrosis and ureteral transition point. The urinary obstruction has acute features and there may be superimposed debris or hemorrhage in the ureter. Electronically Signed   By: Monte Fantasia M.D.   On: 01/28/2020 05:54   CT Renal Stone Study  Result Date: 01/28/2020 CLINICAL DATA:  Right flank and low back pain, history of thyroid and rectal cancer EXAM: CT ABDOMEN AND PELVIS WITHOUT CONTRAST TECHNIQUE: Multidetector CT imaging of the abdomen and pelvis was performed following the standard protocol without IV contrast. COMPARISON:  06/27/2019 FINDINGS: Lower chest: No acute pleural or parenchymal lung disease. Hepatobiliary: No focal liver abnormality is seen. No gallstones, gallbladder wall thickening, or biliary dilatation. Pancreas: Choose 1 Spleen: Choose 1 Adrenals/Urinary Tract: There is severe right-sided hydronephrosis and proximal  right hydroureter, with significant Peri ureteral fat stranding. There is an 8 mm  area of increased attenuation within the proximal right ureter reference image 37. Hounsfield attenuation is 40, and this could reflect a noncalcified ureteral stone versus soft tissue mass. Retrograde evaluation may be useful. The left kidney is unremarkable. Bladder is grossly normal. No focal adrenal abnormalities. Stomach/Bowel: No bowel obstruction or ileus. Diverticulosis of the descending colon without diverticulitis. No bowel wall thickening or inflammatory change. Vascular/Lymphatic: Aortic atherosclerosis. No enlarged abdominal or pelvic lymph nodes. Reproductive: Uterus and bilateral adnexa are unremarkable. Other: No free fluid or free gas. No abdominal wall hernia. Musculoskeletal: No acute or destructive bony lesions. Reconstructed images demonstrate no additional findings. IMPRESSION: 1. Severe right-sided hydronephrosis and proximal right hydroureter, with significant periureteral fat stranding. There is an 8 mm area of increased attenuation within the proximal right ureter. Hounsfield attenuation is 40, and this could reflect a noncalcified ureteral stone versus soft tissue mass. Retrograde evaluation may be useful. 2. Diverticulosis without diverticulitis. 3. Aortic Atherosclerosis (ICD10-I70.0). Electronically Signed   By: Randa Ngo M.D.   On: 01/28/2020 01:06    Review of Systems  Constitutional: Negative for fever.  Genitourinary: Positive for flank pain.  All other systems reviewed and are negative.   Blood pressure 114/69, pulse 81, temperature 97.9 F (36.6 C), temperature source Axillary, resp. rate 15, SpO2 100 %. Physical Exam  Constitutional: She appears well-developed.  HENT:  Head: Normocephalic.  Eyes: Pupils are equal, round, and reactive to light.  Cardiovascular: Normal rate.  Respiratory: Effort normal.  GI: Soft.  Genitourinary:    Genitourinary Comments: Mild Rt CVAT    Musculoskeletal:     Cervical back: Normal range of motion.  Neurological: She is alert.  Skin: Skin is warm.  Psychiatric: She has a normal mood and affect.     Assessment/Plan  Proceed as planned with cysto, RIGHT ureteral stent placement. Risks, benefits, alternatives, expected peri-op course discussed as well as possible that stent is not effective and role of nephrostomy.   Alexis Frock, MD 01/28/2020, 2:47 PM

## 2020-01-28 NOTE — Plan of Care (Signed)
CT urogram with what appears to be external ureteral compression.  Plan for right ureteral stent placement.  Patient to remain n.p.o.

## 2020-01-28 NOTE — Anesthesia Preprocedure Evaluation (Addendum)
Anesthesia Evaluation  Patient identified by MRN, date of birth, ID band Patient awake    Reviewed: Allergy & Precautions, NPO status , Patient's Chart, lab work & pertinent test results  Airway Mallampati: II  TM Distance: >3 FB Neck ROM: Full    Dental no notable dental hx. (+) Teeth Intact, Dental Advisory Given   Pulmonary neg pulmonary ROS,    Pulmonary exam normal breath sounds clear to auscultation       Cardiovascular negative cardio ROS Normal cardiovascular exam Rhythm:Regular Rate:Normal     Neuro/Psych  Headaches, negative psych ROS   GI/Hepatic negative GI ROS, Neg liver ROS, Rectal CA   Endo/Other  Hypothyroidism Hx of Thyoid CA  Renal/GU K+ 3.5 Cr 0.96     Musculoskeletal  (+) Arthritis ,   Abdominal   Peds  Hematology Hgb 12.2   Anesthesia Other Findings   Reproductive/Obstetrics negative OB ROS                            Anesthesia Physical Anesthesia Plan  ASA: II  Anesthesia Plan: General   Post-op Pain Management:    Induction: Intravenous  PONV Risk Score and Plan: 3 and Treatment may vary due to age or medical condition, Ondansetron, Dexamethasone and Midazolam  Airway Management Planned: LMA  Additional Equipment: None  Intra-op Plan:   Post-operative Plan:   Informed Consent: I have reviewed the patients History and Physical, chart, labs and discussed the procedure including the risks, benefits and alternatives for the proposed anesthesia with the patient or authorized representative who has indicated his/her understanding and acceptance.     Dental advisory given  Plan Discussed with: CRNA  Anesthesia Plan Comments:         Anesthesia Quick Evaluation

## 2020-01-28 NOTE — Brief Op Note (Signed)
01/27/2020 - 01/28/2020  4:37 PM  PATIENT:  Ashley Moore  69 y.o. female  PRE-OPERATIVE DIAGNOSIS:  URETERAL OBSTRUCTION,HYDRONEPHROSIS  POST-OPERATIVE DIAGNOSIS:  URETERAL OBSTRUCTION,HYDRONEPHROSIS  PROCEDURE:  Procedure(s): CYSTOSCOPY WITH RETROGRADE PYELOGRAM, URETEROSCOPY AND STENT PLACEMENT (Right)  SURGEON:  Surgeon(s) and Role:    * Alexis Frock, MD - Primary  PHYSICIAN ASSISTANT:   ASSISTANTS: none   ANESTHESIA:   general  EBL:  0 mL   BLOOD ADMINISTERED:none  DRAINS: none   LOCAL MEDICATIONS USED:  NONE  SPECIMEN:  No Specimen  DISPOSITION OF SPECIMEN:  N/A  COUNTS:  YES  TOURNIQUET:  * No tourniquets in log *  DICTATION: .Other Dictation: Dictation Number [id number]  PLAN OF CARE: Discharge to home after PACU  PATIENT DISPOSITION:  PACU - hemodynamically stable.   Delay start of Pharmacological VTE agent (>24hrs) due to surgical blood loss or risk of bleeding: yes

## 2020-01-28 NOTE — Consult Note (Signed)
Urology Consult Note   Requesting Attending Physician:  Merrily Pew, MD Service Providing Consult: Urology  Consulting Attending: Dr. Irine Seal   Reason for Consult:  Right hydronephrosis  HPI: Ashley Moore is seen in consultation for reasons noted above at the request of Mesner, Corene Cornea, MD for evaluation of right flank pain.  Has had a week of right flank discomfort which acutely progressed to sharp colicky pain approx 6 hours ago. Denies dysuria, urinary frequency, urinary urgency, fevers, chills.   Had a stone ~40 years ago, which passed spontaneously.   Had gross hematuria several years ago in the setting of a UTI.  E Coli UTI 01/08/2020 Klebsiella UTI 06/10/2019 Klebsiella UTI 09/26/2017 E Coli UTI 02/14/2017  Sister had kidney stones.   History of anal cancer s/p excision, had chemoradiation 2018. Reportedly in remission.   No prior intraabdominal surgery.  Not on blood thinners.   Former smoker: 25 year 1ppd.   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53   Family history of breast cancer    Family history of lung cancer    Family history of non-Hodgkin's lymphoma    Family history of ovarian cancer    Headache    Hx of varicella    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([place])    Thyroid cancer ([place])     Past Surgical History:  Past Surgical History:  Procedure Laterality Date   BUNIONECTOMY Right    IR GENERIC HISTORICAL  11/14/2016   IR US GUIDE VASC ACCESS RIGHT 11/14/2016 [facility]   IR GENERIC HISTORICAL  11/14/2016   IR FLUORO GUIDE CV LINE RIGHT 11/14/2016 [facility]   IR GENERIC HISTORICAL  12/12/2016   IR US GUIDE VASC ACCESS RIGHT 12/12/2016 [name], MD [facility]   IR GENERIC HISTORICAL  12/12/2016   IR FLUORO GUIDE CV LINE RIGHT 12/12/2016 [name], MD [facility]   SKIN CANCER EXCISION     bowens disease    Medication: Current Facility-Administered Medications  Medication Dose Route  Frequency Provider Last Rate Last Admin   morphine 4 MG/ML injection 6 mg  6 mg Intravenous Once Mesner, Corene Cornea, MD       ondansetron (ZOFRAN) injection 4 mg  4 mg Intravenous Q6H PRN Ward, Kristen N, DO   4 mg at 01/28/20 0138   Current Outpatient Medications  Medication Sig Dispense Refill   calcium-vitamin D (OSCAL WITH D) 500-200 MG-UNIT tablet Take 1 tablet by mouth daily with breakfast.     conjugated estrogens (PREMARIN) vaginal cream Place 0.5 Applicatorfuls vaginally at bedtime.      CRANBERRY SOFT PO Take 1 each by mouth daily.     meloxicam (MOBIC) 15 MG tablet TAKE 1 TABLET EVERY DAY  ( APPOINTMENT IS NEEDED  ) (Patient taking differently: Take 15 mg by mouth daily. ) 90 tablet 0   mirabegron ER (MYRBETRIQ) 25 MG TB24 tablet Take 25 mg by mouth daily.      Multiple Vitamin (MULTIVITAMIN) tablet Take 1 tablet by mouth daily.     tiZANidine (ZANAFLEX) 4 MG tablet TAKE 1 TABLET EVERY 8 HOURS AS NEEDED FOR MUSCLE SPASM(S) (Patient taking differently: Take 2 mg by mouth every 8 (eight) hours as needed for muscle spasms. ) 90 tablet 5   Turmeric 500 MG CAPS Take 1 capsule by mouth daily.     levothyroxine (SYNTHROID) 50 MCG tablet Take 1 tablet (50 mcg total) by mouth daily. Schedule lab appointment  to recheck thyroid in 2-3 months. 90 tablet 1   sulfamethoxazole-trimethoprim (BACTRIM DS) 800-160 MG tablet Take 1 tablet by mouth 2 (two) times daily. For uti (Patient not taking: Reported on 01/28/2020) 10 tablet 0    Allergies: Allergies  Allergen Reactions   Dilaudid [Hydromorphone Hcl]     Throws up  Cannot tolerate, IV or IM    Benadryl [Diphenhydramine] Other (See Comments)    Tingle all over    Fentanyl Other (See Comments)    Reports it makes her violent and projectile vomiting   Melatonin     Tingle all over    Lemon Oil Diarrhea   Penicillins Rash    Social History: Social History   Tobacco Use   Smoking status: Former Smoker   Smokeless tobacco:  Former Systems developer    Quit date: 09/20/1995  Substance Use Topics   Alcohol use: Yes    Alcohol/week: 0.0 standard drinks    Comment: occasional   Drug use: Yes    Types: Marijuana    Family History Family History  Problem Relation Age of Onset   Lung cancer Mother        lung   Breast cancer Mother 86   Ovarian cancer Mother 74   Pancreatitis Father    Breast cancer Sister 46   Breast cancer Maternal Grandmother 45       breast    Breast cancer Maternal Aunt 75       breast   Melanoma Sister    Breast cancer Sister 36   Non-Hodgkin's lymphoma Paternal Grandmother     Review of Systems 10 systems were reviewed and are negative except as noted specifically in the HPI.  Objective   Vital signs in last 24 hours: BP (!) 157/87 (BP Location: Right Arm)    Pulse 84    Temp 97.9 F (36.6 C) (Axillary)    Resp 16    SpO2 100%   Physical Exam General: NAD, A&O, resting, appropriate HEENT: [place]/AT, EOMI, MMM Pulmonary: Normal work of breathing Cardiovascular: HDS, adequate peripheral perfusion Abdomen: Soft, NTTP, nondistended. GU: Right CVA tenderness Extremities: warm and well perfused Neuro: Appropriate, no focal neurological deficits  Most Recent Labs: Lab Results  Component Value Date   WBC 6.2 01/28/2020   HGB 12.2 01/28/2020   HCT 38.4 01/28/2020   PLT 232 01/28/2020    Lab Results  Component Value Date   NA 139 01/28/2020   K 3.5 01/28/2020   CL 105 01/28/2020   CO2 23 01/28/2020   BUN 22 01/28/2020   CREATININE 0.96 01/28/2020   CALCIUM 9.6 01/28/2020    No results found for: INR, APTT   Urine Culture: @[id number] (laburin,org,[id number])@   IMAGING: CT Renal Stone Study  Result Date: 01/28/2020 CLINICAL DATA:  Right flank and low back pain, history of thyroid and rectal cancer EXAM: CT ABDOMEN AND PELVIS WITHOUT CONTRAST TECHNIQUE: Multidetector CT imaging of the abdomen and pelvis was performed following the standard protocol without IV  contrast. COMPARISON:  06/27/2019 FINDINGS: Lower chest: No acute pleural or parenchymal lung disease. Hepatobiliary: No focal liver abnormality is seen. No gallstones, gallbladder wall thickening, or biliary dilatation. Pancreas: Choose 1 Spleen: Choose 1 Adrenals/Urinary Tract: There is severe right-sided hydronephrosis and proximal right hydroureter, with significant Peri ureteral fat stranding. There is an 8 mm area of increased attenuation within the proximal right ureter reference image 37. Hounsfield attenuation is 40, and this could reflect a noncalcified ureteral stone versus soft tissue mass. Retrograde evaluation  may be useful. The left kidney is unremarkable. Bladder is grossly normal. No focal adrenal abnormalities. Stomach/Bowel: No bowel obstruction or ileus. Diverticulosis of the descending colon without diverticulitis. No bowel wall thickening or inflammatory change. Vascular/Lymphatic: Aortic atherosclerosis. No enlarged abdominal or pelvic lymph nodes. Reproductive: Uterus and bilateral adnexa are unremarkable. Other: No free fluid or free gas. No abdominal wall hernia. Musculoskeletal: No acute or destructive bony lesions. Reconstructed images demonstrate no additional findings. IMPRESSION: 1. Severe right-sided hydronephrosis and proximal right hydroureter, with significant periureteral fat stranding. There is an 8 mm area of increased attenuation within the proximal right ureter. Hounsfield attenuation is 40, and this could reflect a noncalcified ureteral stone versus soft tissue mass. Retrograde evaluation may be useful. 2. Diverticulosis without diverticulitis. 3. Aortic Atherosclerosis (ICD10-I70.0). Electronically Signed   By: Randa Ngo M.D.   On: 01/28/2020 01:06    ------  Assessment:  69 y.o. female with history of anal cancer status post chemoradiation 2018, now with right renal colic with proximal ureteral obstruction from what appears to be extrinsic adenopathy, although  could consider intraluminal mass or matrix stone as well.  No concerns for infection at this time.  I discussed the indications for procedural intervention with the patient which includes infected obstruction, AKI, intractable pain, as well as the risks and benefits of ureteral stent placement or percutaneous nephrostomy tube placement.  At this time, we will plan to obtain a CT urogram for further delineation of the anatomy which may assist in selection of intervention if she continues to have ongoing pain.   Recommendations: - Obtain CT Urogram - Aggressive multimodal pain regimen - Follow up urine culture -Anticipate right ureteral stent versus percutaneous nephrostomy tube placement   Thank you for this consult. Please contact the urology consult pager with any further questions/concerns.

## 2020-01-28 NOTE — Anesthesia Postprocedure Evaluation (Signed)
Anesthesia Post Note  Patient: SIMI BERMUDES  Procedure(s) Performed: CYSTOSCOPY WITH RETROGRADE PYELOGRAM, URETEROSCOPY AND STENT PLACEMENT (Right )     Patient location during evaluation: PACU Anesthesia Type: General Level of consciousness: awake and alert Pain management: pain level controlled Vital Signs Assessment: post-procedure vital signs reviewed and stable Respiratory status: spontaneous breathing, nonlabored ventilation, respiratory function stable and patient connected to nasal cannula oxygen Cardiovascular status: blood pressure returned to baseline and stable Postop Assessment: no apparent nausea or vomiting Anesthetic complications: no    Last Vitals:  Vitals:   01/28/20 1645 01/28/20 1700  BP: (!) 143/74 132/79  Pulse: 91 78  Resp: 14 15  Temp: 36.5 C   SpO2: 100% 100%    Last Pain:  Vitals:   01/28/20 1700  TempSrc:   PainSc: Asleep                 Rohnan Bartleson L Bradleigh Sonnen

## 2020-01-28 NOTE — ED Notes (Signed)
Updated by Short Stay that pts surgery is scheduled for 4 pm today.  Short Stay reports they will come to get pt around 3. Pt updated.

## 2020-01-28 NOTE — ED Notes (Signed)
Informed consent obtained and witnessed by this RN, left at bedside.  

## 2020-01-28 NOTE — ED Provider Notes (Signed)
6:36 AM Assumed care from Dr. Leonides Schanz, please see their note for full history, physical and decision making until this point. In brief this is a 69 y.o. year old female who presented to the ED tonight with Flank Pain     Urology has seen and recommends ct urogram.   Ct urogram with malignant obstruction. Plan for OR.   Labs, studies and imaging reviewed by myself and considered in medical decision making if ordered. Imaging interpreted by radiology.  Labs Reviewed  COMPREHENSIVE METABOLIC PANEL - Abnormal; Notable for the following components:      Result Value   Glucose, Bld 135 (*)    GFR calc non Af Amer 60 (*)    All other components within normal limits  URINALYSIS, ROUTINE W REFLEX MICROSCOPIC - Abnormal; Notable for the following components:   APPearance HAZY (*)    Ketones, ur 20 (*)    Leukocytes,Ua SMALL (*)    Bacteria, UA RARE (*)    All other components within normal limits  SARS CORONAVIRUS 2 BY RT PCR (HOSPITAL ORDER, [facility])  URINE CULTURE  CBC WITH DIFFERENTIAL/PLATELET  LIPASE, BLOOD    CT ABDOMEN PELVIS W CONTRAST  Final Result    CT Renal Stone Study  Final Result      No follow-ups on file.    Ashley Moore, Corene Cornea, MD 01/29/20 [phone]

## 2020-01-28 NOTE — Anesthesia Procedure Notes (Signed)
Procedure Name: LMA Insertion Date/Time: 01/28/2020 4:10 PM Performed by: Claudia Desanctis, CRNA Pre-anesthesia Checklist: Emergency Drugs available, Patient identified, Suction available and Patient being monitored Patient Re-evaluated:Patient Re-evaluated prior to induction Oxygen Delivery Method: Circle system utilized Preoxygenation: Pre-oxygenation with 100% oxygen Induction Type: IV induction Ventilation: Mask ventilation without difficulty LMA: LMA inserted LMA Size: 4.0 Number of attempts: 1 Placement Confirmation: positive ETCO2 and breath sounds checked- equal and bilateral Tube secured with: Tape Dental Injury: Teeth and Oropharynx as per pre-operative assessment

## 2020-01-28 NOTE — ED Provider Notes (Signed)
TIME SEEN: 1:20 AM  CHIEF COMPLAINT: Right-sided flank pain  HPI: Patient is a 69 year old female with previous history of thyroid cancer, rectal cancer, one previous kidney stone approximately 40 years ago who presents to the emergency department right-sided flank pain that started a day ago and significantly worsened today.  She has had nausea and vomiting.  No diarrhea.  No fever.  No dysuria or hematuria.  States this feels similar to her previous kidney stones.  Reports that her anal cancer has been in remission since 2018.  She is status post radiation.  She is followed by Andria Frames at [facility] for recurrent UTIs and states last week she did have a cystoscopy but states was not able to tolerate it was told this would have to be done under sedation.  She does not have a local urologist.  ROS: See HPI Constitutional: no fever  Eyes: no drainage  ENT: no runny nose   Cardiovascular:  no chest pain  Resp: no SOB  GI:  vomiting GU: no dysuria Integumentary: no rash  Allergy: no hives  Musculoskeletal: no leg swelling  Neurological: no slurred speech ROS otherwise negative  PAST MEDICAL HISTORY/PAST SURGICAL HISTORY:  Past Medical History:  Diagnosis Date  . Bowen's disease    excised 1992  . Family history of breast cancer   . Family history of lung cancer   . Family history of non-Hodgkin's lymphoma   . Family history of ovarian cancer   . Headache   . Hx of varicella   . 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([place])   . Thyroid cancer [facility])     MEDICATIONS:  Prior to Admission medications   Medication Sig Start Date End Date Taking? Authorizing Provider  cholecalciferol (VITAMIN D3) 25 MCG (1000 UT) tablet Take 5,000 Units by mouth daily.    [provider]  conjugated estrogens (PREMARIN) vaginal cream Place 0.5 Applicatorfuls vaginally at bedtime.  07/02/19   [provider]  levothyroxine (SYNTHROID) 50 MCG tablet Take 1 tablet (50 mcg total) by mouth daily. Schedule lab  appointment to recheck thyroid in 2-3 months. 01/13/20   Panosh, Standley Brooking, MD  meloxicam (MOBIC) 15 MG tablet TAKE 1 TABLET EVERY DAY  ( APPOINTMENT IS NEEDED  ) 10/15/19   Pieter Partridge, DO  mirabegron ER (MYRBETRIQ) 25 MG TB24 tablet Take by mouth. 12/12/19   [provider]  Multiple Vitamin (MULTIVITAMIN) tablet Take 1 tablet by mouth daily.    [provider]  solifenacin (VESICARE) 5 MG tablet Take by mouth daily.  07/02/19   [provider]  sulfamethoxazole-trimethoprim (BACTRIM DS) 800-160 MG tablet Take 1 tablet by mouth 2 (two) times daily. For uti 01/08/20   Panosh, Standley Brooking, MD  tiZANidine (ZANAFLEX) 4 MG tablet TAKE 1 TABLET EVERY 8 HOURS AS NEEDED FOR MUSCLE SPASM(S) Patient taking differently: 6 mg.  12/17/18   Tomi Likens, Adam R, DO  Turmeric 500 MG CAPS Take 1 capsule by mouth daily.    [provider]    ALLERGIES:  Allergies  Allergen Reactions  . Dilaudid [Hydromorphone Hcl]     Throws up  Cannot tolerate, IV or IM   . Benadryl [Diphenhydramine] Other (See Comments)    Tingle all over   . Fentanyl Other (See Comments)    Reports it makes her violent and projectile vomiting  . Melatonin     Tingle all over   . Lemon Oil Diarrhea  . Penicillins Rash    SOCIAL HISTORY:  Social History  Tobacco Use  . Smoking status: Former Research scientist (life sciences)  . Smokeless tobacco: Former Systems developer    Quit date: 09/20/1995  Substance Use Topics  . Alcohol use: Yes    Alcohol/week: 0.0 standard drinks    Comment: occasional    FAMILY HISTORY: Family History  Problem Relation Age of Onset  . Lung cancer Mother        lung  . Breast cancer Mother 46  . Ovarian cancer Mother 76  . Pancreatitis Father   . Breast cancer Sister 69  . Breast cancer Maternal Grandmother 31       breast   . Breast cancer Maternal Aunt 75       breast  . Melanoma Sister   . Breast cancer Sister 105  . Non-Hodgkin's lymphoma Paternal Grandmother     EXAM: BP (!) 157/87 (BP Location:  Right Arm)   Pulse 84   Temp 97.9 F (36.6 C) (Axillary)   Resp 16   SpO2 100%  CONSTITUTIONAL: Alert and oriented and responds appropriately to questions.  Elderly, appears very uncomfortable, nontoxic, afebrile HEAD: Normocephalic EYES: Conjunctivae clear, pupils appear equal, EOM appear intact ENT: normal nose; moist mucous membranes NECK: Supple, normal ROM CARD: RRR; S1 and S2 appreciated; no murmurs, no clicks, no rubs, no gallops RESP: Normal chest excursion without splinting or tachypnea; breath sounds clear and equal bilaterally; no wheezes, no rhonchi, no rales, no hypoxia or respiratory distress, speaking full sentences ABD/GI: Normal bowel sounds; non-distended; soft, non-tender, no rebound, no guarding, no peritoneal signs, no hepatosplenomegaly BACK:  The back appears normal, patient has right-sided CVA tenderness EXT: Normal ROM in all joints; no deformity noted, no edema; no cyanosis SKIN: Normal color for age and race; warm; no rash on exposed skin NEURO: Moves all extremities equally PSYCH: The patient's mood and manner are appropriate.   MEDICAL DECISION MAKING: Patient here with severe right-sided flank pain radiating into the lower abdomen.  Abdominal exam is benign.  Labs, urine, CT ordered upon patient arrival given concern for recurrent renal stone.  Labs reviewed/interpreted and show no significant abnormality.  No leukocytosis.  Normal creatinine, LFTs.  I evaluated patient after CT completed but not yet read.  She has received morphine 4 mg by EMS and then again here in the emergency department without any relief.  She reports severe allergies to Dilaudid and fentanyl.  Have asked her what she would like to try for pain and she reports oxycodone.  ED PROGRESS: 1:50 AM  CT scan reviewed/interpreted and shows severe right-sided hydronephrosis and proximal right hydroureter with significant periureteral fat stranding.  There is an 8 mm area of increased attenuation  within the proximal right ureter that could represent a noncalcified ureteral stone versus a soft tissue mass.  She has diverticulosis without diverticulitis.  Otherwise no acute abnormality.  Urinalysis pending.  2:27 AM  Spoke with Dr. Sharlot Gowda with urology.  He agrees with the ED to ED transfer and they will see tonight for possible stent placement.  We will keep her n.p.o.  He is comfortable with dose of IV Toradol here for further pain control.  Updated patient and family at bedside.  Will obtain Covid swab.   I reviewed all nursing notes and pertinent previous records as available.  I have reviewed and interpreted any EKGs, lab and urine results, imaging (as available).  CRITICAL CARE Performed by: Pryor Curia   Total critical care time: 45 minutes  Critical care time was exclusive of separately billable  procedures and treating other patients.  Critical care was necessary to treat or prevent imminent or life-threatening deterioration.  Critical care was time spent personally by me on the following activities: development of treatment plan with patient and/or surrogate as well as nursing, discussions with consultants, evaluation of patient's response to treatment, examination of patient, obtaining history from patient or surrogate, ordering and performing treatments and interventions, ordering and review of laboratory studies, ordering and review of radiographic studies, pulse oximetry and re-evaluation of patient's condition.   LODA OLBRICH was evaluated in Emergency Department on 01/28/2020 for the symptoms described in the history of present illness. She was evaluated in the context of the global COVID-19 pandemic, which necessitated consideration that the patient might be at risk for infection with the SARS-CoV-2 virus that causes COVID-19. Institutional protocols and algorithms that pertain to the evaluation of patients at risk for COVID-19 are in a state of rapid change based  on information released by regulatory bodies including the CDC and federal and state organizations. These policies and algorithms were followed during the patient's care in the ED.      Loyal Holzheimer, Delice Bison, DO 01/28/20 0230

## 2020-01-28 NOTE — ED Notes (Signed)
Pt ambulatory to bathroom, no assistance needed.  

## 2020-01-29 ENCOUNTER — Other Ambulatory Visit: Payer: Self-pay

## 2020-01-29 ENCOUNTER — Telehealth: Payer: Self-pay | Admitting: *Deleted

## 2020-01-29 ENCOUNTER — Telehealth: Payer: Self-pay | Admitting: Neurology

## 2020-01-29 NOTE — Op Note (Signed)
NAME: Ashley Moore, Ashley Moore MEDICAL RECORD [id number] ACCOUNT [id number] DATE OF BIRTH:[birth year] FACILITY: [facility] LOCATION: [facility] PHYSICIAN:Kalandra Masters Tresa Moore, MD  OPERATIVE REPORT  DATE OF PROCEDURE:  01/28/2020  SURGEON:  Alexis Frock, MD  PREOPERATIVE DIAGNOSIS:  Right malignant hydronephrosis.  PROCEDURE: 1.  Cystoscopy, right pyelogram, interpretation. 2.  Right ureteral stent, 8 x 24 Polaris, no tether.  ESTIMATED BLOOD LOSS:  Nil.  COMPLICATIONS:  None.  SPECIMENS:  None.  FINDINGS: 1.  Mild urethral stenosis and bladder erythema consistent with radiation changes, status post pelvic radiation likely. 2.  Right external compression of the mid ureter as anticipated. 3.  Successful placement of right ureteral stent, proximal end in renal pelvis, distal end in urinary bladder.  INDICATIONS:  The patient is an unfortunate 69 year old woman with history of locally advanced anal cancer.  She follows with medical oncology for this.  She was found on workup of colicky right flank pain to have what appears to be a right malignant  obstruction versus a new metastatic foci in the retroperitoneum and near the mid ureter.  Her symptoms are significant.  Options were discussed for management, including nephrostomy tube versus observation versus stenting and she wished to proceed with  the latter.  Informed consent was obtained and placed in the medical record.  DESCRIPTION OF PROCEDURE:  The patient being identified, the procedure being right ureteral stent placement was confirmed.  Procedure timeout was performed.  IV antibiotics administered.  General LMA anesthesia induced.  The patient was placed into a low  lithotomy position.  A sterile field was created, prepping and draping the patient's vagina, introitus and proximal thighs using iodine.  Cystourethroscopy was performed using 21-French rigid cystoscope with offset lens.  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  There were no obvious papillary lesions.  The urethral meatus was somewhat stenotic.  The right ureteral orifice was cannulated with a 6-French renal catheter and right retrograde  pyelogram was obtained.  Right retrograde pyelogram demonstrated a single right ureter with single system right kidney.  There was significant hydronephrosis with narrowing, consistent with known extrinsic compression at the level of the mid ureter.  A 0.038 sensor wire was  advanced to lower pole over which a new 8 x 24 Polaris-type stent was then placed using cystoscopic and fluoroscopic guidance.  Good proximal and distal planes were noted.  The bladder was emptied per cystoscope.  Procedure was then terminated.  The  patient tolerated the procedure well.  No immediate perioperative complications.  The patient was taken to the postanesthesia care unit in stable condition.  Plan for discharge home.  VN/NUANCE  D:01/28/2020 T:01/28/2020 JOB:[id number]

## 2020-01-29 NOTE — Telephone Encounter (Signed)
Patient called needing a refill for Meloxicam to [facility]. Patient was last seen 10/09/18, has an appt scheduled for 06/10/20.

## 2020-01-29 NOTE — Telephone Encounter (Addendum)
Called with concerns of CT scan she had yesterday and requesting an appointment w/Dr. Benay Spice asap. Provided MD w/scan report and patient message. Called and offered visit w/BS on 5/13 or 5/14 at 0800. She prefers Friday.

## 2020-01-31 ENCOUNTER — Telehealth: Payer: Self-pay

## 2020-01-31 ENCOUNTER — Telehealth: Payer: Self-pay | Admitting: *Deleted

## 2020-01-31 ENCOUNTER — Inpatient Hospital Stay: Payer: Medicare HMO | Attending: Gynecologic Oncology | Admitting: Oncology

## 2020-01-31 ENCOUNTER — Other Ambulatory Visit: Payer: Self-pay

## 2020-01-31 ENCOUNTER — Telehealth: Payer: Self-pay | Admitting: Oncology

## 2020-01-31 VITALS — BP 120/72 | HR 85 | Temp 98.0°F | Resp 18 | Ht 62.0 in | Wt 189.7 lb

## 2020-01-31 DIAGNOSIS — Z9221 Personal history of antineoplastic chemotherapy: Secondary | ICD-10-CM | POA: Insufficient documentation

## 2020-01-31 DIAGNOSIS — Z85048 Personal history of other malignant neoplasm of rectum, rectosigmoid junction, and anus: Secondary | ICD-10-CM | POA: Diagnosis not present

## 2020-01-31 DIAGNOSIS — Z923 Personal history of irradiation: Secondary | ICD-10-CM | POA: Diagnosis not present

## 2020-01-31 DIAGNOSIS — C210 Malignant neoplasm of anus, unspecified: Secondary | ICD-10-CM

## 2020-01-31 DIAGNOSIS — Z803 Family history of malignant neoplasm of breast: Secondary | ICD-10-CM | POA: Diagnosis not present

## 2020-01-31 NOTE — Progress Notes (Signed)
[place] OFFICE PROGRESS NOTE   Diagnosis: Anal cancer  INTERVAL HISTORY:   Ashley Moore returns prior to a scheduled visit.  She reports having recent urinary tract infections.  She has been evaluated by Dr. Regis Bill and urology at [facility].  She presented to emergency room with severe right flank pain on 01/28/2020.  A CT confirmed severe right hydroureteronephrosis.  She underwent a cystoscopy, right pyelogram, and placement of a right ureteral stent by Dr. Bess Harvest on 01/28/2020.  There was bladder erythema consistent with radiation change.  External compression of the mid right ureter was noted.  A CT abdomen pelvis revealed retroperitoneal adenopathy with right hydronephrosis and a transition point at the right mid ureter.  A CT renal stone study revealed an 8 mm area of increased attenuation of the proximal right ureter felt to represent a noncalcified stone versus a soft tissue mass.  Ashley Moore reports resolution of the right flank pain following placement of the right ureter stent.  She otherwise feels well.  Good appetite.  She had a right breast biopsy in March that confirmed an intraductal papilloma of the right breast.  She is scheduled for surgical excision by Dr. Brantley Stage.  Objective:  Vital signs in last 24 hours:  Blood pressure 120/72, pulse 85, temperature 98 F (36.7 C), temperature source Temporal, resp. rate 18, height 5\' 2"  (1.575 m), weight 189 lb 11.2 oz (86 kg), SpO2 100 %.    HEENT: Neck without mass Lymphatics: No cervical, supraclavicular, axillary, or inguinal nodes Resp: Lungs clear bilaterally Cardio: Regular rate and rhythm GI: No mass, nontender, no hepatosplenomegaly Vascular: No leg edema Rectal: Radiation changes at the perineum, no visible or palpable mass at the anal verge or anal canal.  Brown stool on the examination glove   Lab Results:  Lab Results  Component Value Date   WBC 6.2 01/28/2020   HGB 12.2 01/28/2020   HCT 38.4  01/28/2020   MCV 87.7 01/28/2020   PLT 232 01/28/2020   NEUTROABS 4.5 01/28/2020    CMP  Lab Results  Component Value Date   NA 139 01/28/2020   K 3.5 01/28/2020   CL 105 01/28/2020   CO2 23 01/28/2020   GLUCOSE 135 (H) 01/28/2020   BUN 22 01/28/2020   CREATININE 0.96 01/28/2020   CALCIUM 9.6 01/28/2020   PROT 7.6 01/28/2020   ALBUMIN 3.8 01/28/2020   AST 35 01/28/2020   ALT 24 01/28/2020   ALKPHOS 68 01/28/2020   BILITOT 0.7 01/28/2020   GFRNONAA 60 (L) 01/28/2020   GFRAA >60 01/28/2020     Imaging:  CT ABDOMEN PELVIS W CONTRAST  Result Date: 01/28/2020 CLINICAL DATA:  Flank pain with kidney stone suspected EXAM: CT ABDOMEN AND PELVIS WITH CONTRAST TECHNIQUE: Multidetector CT imaging of the abdomen and pelvis was performed using the standard protocol following bolus administration of intravenous contrast. CONTRAST:  163mL OMNIPAQUE IOHEXOL 300 MG/ML  SOLN COMPARISON:  Noncontrast CT from earlier today FINDINGS: Lower chest:  No contributory findings. Hepatobiliary: No focal liver abnormality.No evidence of biliary obstruction or stone. Pancreas: Unremarkable. Spleen: Unremarkable. Adrenals/Urinary Tract: Negative adrenals. Right renal expansion, hydroureteronephrosis, and delayed renal enhancement. There is extensive perinephric and periureteric edema on the right. A transition is seen at the level of the mid right ureter where there is vague increased density of the ureter and retroperitoneal adenopathy. There is chronic mild thickening of the bladder in the setting of prior pelvic radiotherapy. Stomach/Bowel: No obstruction. History of anal cancer with  detection of local recurrence limited by technique. There is mild haziness of fat around the lower rectum/anus which is likely treatment related. Left colonic diverticulosis. Vascular/Lymphatic: No acute vascular abnormality. Retroperitoneal lymphadenopathy. A node posterior to the cava and up lifting it measures 3.7 x 19 mm on axial  slices. There is nodes anterior and right lateral to the lower cava, measuring up to 15 mm short axis and in the region of ureteral obstruction. Reproductive:Atrophic uterus, expected Other: No ascites or pneumoperitoneum. Musculoskeletal: Bilateral hip osteoarthritis. Lumbar spine degeneration with mild scoliosis. IMPRESSION: Retroperitoneal adenopathy, likely metastatic disease from the patient's rectal cancer, which is associated with the right hydronephrosis and ureteral transition point. The urinary obstruction has acute features and there may be superimposed debris or hemorrhage in the ureter. Electronically Signed   By: Monte Fantasia M.D.   On: 01/28/2020 05:54   DG C-Arm 1-60 Min-No Report  Result Date: 01/28/2020 Fluoroscopy was utilized by the requesting physician.  No radiographic interpretation.   CT Renal Stone Study  Result Date: 01/28/2020 CLINICAL DATA:  Right flank and low back pain, history of thyroid and rectal cancer EXAM: CT ABDOMEN AND PELVIS WITHOUT CONTRAST TECHNIQUE: Multidetector CT imaging of the abdomen and pelvis was performed following the standard protocol without IV contrast. COMPARISON:  06/27/2019 FINDINGS: Lower chest: No acute pleural or parenchymal lung disease. Hepatobiliary: No focal liver abnormality is seen. No gallstones, gallbladder wall thickening, or biliary dilatation. Pancreas: Choose 1 Spleen: Choose 1 Adrenals/Urinary Tract: There is severe right-sided hydronephrosis and proximal right hydroureter, with significant Peri ureteral fat stranding. There is an 8 mm area of increased attenuation within the proximal right ureter reference image 37. Hounsfield attenuation is 40, and this could reflect a noncalcified ureteral stone versus soft tissue mass. Retrograde evaluation may be useful. The left kidney is unremarkable. Bladder is grossly normal. No focal adrenal abnormalities. Stomach/Bowel: No bowel obstruction or ileus. Diverticulosis of the descending colon  without diverticulitis. No bowel wall thickening or inflammatory change. Vascular/Lymphatic: Aortic atherosclerosis. No enlarged abdominal or pelvic lymph nodes. Reproductive: Uterus and bilateral adnexa are unremarkable. Other: No free fluid or free gas. No abdominal wall hernia. Musculoskeletal: No acute or destructive bony lesions. Reconstructed images demonstrate no additional findings. IMPRESSION: 1. Severe right-sided hydronephrosis and proximal right hydroureter, with significant periureteral fat stranding. There is an 8 mm area of increased attenuation within the proximal right ureter. Hounsfield attenuation is 40, and this could reflect a noncalcified ureteral stone versus soft tissue mass. Retrograde evaluation may be useful. 2. Diverticulosis without diverticulitis. 3. Aortic Atherosclerosis (ICD10-I70.0). Electronically Signed   By: Randa Ngo M.D.   On: 01/28/2020 01:06    Medications: I have reviewed the patient's current medications.   Assessment/Plan: 1. Anal cancer ? CT abdomen/pelvis 10/21/2016-thickening of the anus extending to the junction between the anus and rectum with a large stool ball in the rectum and mild fat stranding posterior to the rectum. Enlarged lymph nodes in the right and left inguinal regions. A few mildly prominent nodesseen posterior to the rectum. ? 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 ? PET scan [id number] hypermetabolic anal mass with hypermetabolic metastases to the groin region bilaterally, left pelvic sidewall and presacral space. ? Initiation of radiation and cycle 1 5-FU/mitomycin C 11/14/2016 ? Cycle 2 5-FU/mitomycin C 12/12/2016 (5-FU dose reduced due to mucositis, diarrhea, skin breakdown) ? Radiation completed 12/23/2016 ? CT abdomen/pelvis 04/14/2017-resolution of anal mass and bilateral inguinal lymphadenopathy. No residual tumor seen. ? CT abdomen/pelvis 01/28/2020-right  hydroureteronephrosis, transition  at the level of the mid right ureter, retroperitoneal lymphadenopathy ? CT renal stone study 01/28/2020-severe right hydronephrosis and proximal right hydroureter, 8 mm area of increased attenuation in the proximal right ureter-stone versus soft tissue mass 2. Left labial lesions. Question direct extension from the anal cancer versus metastatic disease from anal cancer versus a separate malignant process. 3. History of pain and bleeding secondary to #1 and skin breakdown 4. 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 5. Multiple family members with breast cancer. 6. History of hypokalemia-likely secondary to decreased nutritional intake and diarrhea; potassium in normal range 01/16/2017. No longer taking a potassium supplement. 7. Right hydroureteronephrosis on CT 01/28/2020, status post a cystoscopy/pyelogram 01/28/2020 confirming extrinsic compression of the right ureter, status post stent placement    Disposition: Ashley Moore was diagnosed with anal cancer in 2018.  She completed treatment with concurrent chemotherapy and radiation and entered clinical remission.  She presented this week with right hydronephrosis and associated pain.  A CT reveals retroperitoneal adenopathy, most consistent with recurrence of anal cancer.  Her pain resolved after placement of a right ureter stent.    I discussed the probable diagnosis of recurrent anal cancer with Ashley Moore and her sister.  We reviewed the CT images.  She will be referred for a staging PET scan.  The plan is to consider a definitive course of radiation if the PET scan reveals no evidence of distant metastatic disease.  I discussed the case with Dr. Lisbeth Renshaw.  Her case will be presented at the GI tumor conference after the staging PET scan.  We will consider the indication for biopsy of a retroperitoneal lymph node.  We will consider concurrent chemotherapy and radiation if there is no evidence of metastatic  disease aside from the retroperitoneal lymph nodes.  She will return for an office visit after the staging PET scan.  We will contact Dr. Brantley Stage regarding the indication to proceed with excision of the papilloma.  Betsy Coder, MD  01/31/2020  3:21 PM

## 2020-01-31 NOTE — Telephone Encounter (Signed)
Scheduled appt per 5/14 los - pt aware of appt date and time - my chart active./

## 2020-01-31 NOTE — Telephone Encounter (Signed)
Patient called nurse triage 01/30/2020 at 7:16PM. Patient reports she had a stent placed in between kidney and bladder on yesterday. Patient states she is constipated all day and she says she is having severe stomach pain and cramps. Patient states she has taken Dulcolax and enema from the pharmacy and nothing has happened as of yet. Patient states that she is having constipation. Patient states that she has pain at her anus. Caller states that pain is going into her back. Patient states her last BM was Tuesday. Patient states no other symptoms. Patient was advised to go to ED. Patient was given home advice and advised to go to [facility] ED.

## 2020-01-31 NOTE — Telephone Encounter (Signed)
Spoke with Cecille Rubin, RN from MD [facility] office. Informed Cecille Rubin, RN that [facility] office would reach out to patient about consult with [facility] as requested.

## 2020-02-05 ENCOUNTER — Other Ambulatory Visit: Payer: Self-pay

## 2020-02-07 ENCOUNTER — Other Ambulatory Visit (HOSPITAL_COMMUNITY): Payer: Medicare HMO

## 2020-02-11 ENCOUNTER — Ambulatory Visit (HOSPITAL_BASED_OUTPATIENT_CLINIC_OR_DEPARTMENT_OTHER): Admission: RE | Admit: 2020-02-11 | Payer: Medicare HMO | Source: Home / Self Care | Admitting: Surgery

## 2020-02-11 ENCOUNTER — Encounter (HOSPITAL_BASED_OUTPATIENT_CLINIC_OR_DEPARTMENT_OTHER): Admission: RE | Payer: Self-pay | Source: Home / Self Care

## 2020-02-11 SURGERY — BREAST LUMPECTOMY WITH RADIOACTIVE SEED LOCALIZATION
Anesthesia: Choice | Laterality: Right

## 2020-02-13 ENCOUNTER — Ambulatory Visit (HOSPITAL_COMMUNITY)
Admission: RE | Admit: 2020-02-13 | Discharge: 2020-02-13 | Disposition: A | Discharge status: Home / Self Care | Payer: Medicare HMO | Source: Ambulatory Visit | Attending: Oncology | Admitting: Oncology

## 2020-02-13 ENCOUNTER — Other Ambulatory Visit: Payer: Self-pay

## 2020-02-13 DIAGNOSIS — I2510 Atherosclerotic heart disease of native coronary artery without angina pectoris: Secondary | ICD-10-CM | POA: Insufficient documentation

## 2020-02-13 DIAGNOSIS — C210 Malignant neoplasm of anus, unspecified: Secondary | ICD-10-CM | POA: Diagnosis not present

## 2020-02-13 DIAGNOSIS — N1330 Unspecified hydronephrosis: Secondary | ICD-10-CM | POA: Diagnosis not present

## 2020-02-13 DIAGNOSIS — C772 Secondary and unspecified malignant neoplasm of intra-abdominal lymph nodes: Secondary | ICD-10-CM | POA: Diagnosis not present

## 2020-02-13 LAB — GLUCOSE, CAPILLARY: Glucose-Capillary: 83 mg/dL (ref 70–99)

## 2020-02-13 MED ORDER — FLUDEOXYGLUCOSE F - 18 (FDG) INJECTION
9.2800 | Freq: Once | INTRAVENOUS | Status: AC
  Administered 2020-02-13: 9.28 via INTRAVENOUS

## 2020-02-14 ENCOUNTER — Telehealth: Payer: Self-pay | Admitting: Oncology

## 2020-02-14 ENCOUNTER — Inpatient Hospital Stay: Payer: Medicare HMO | Admitting: Oncology

## 2020-02-14 ENCOUNTER — Telehealth: Payer: Self-pay | Admitting: *Deleted

## 2020-02-14 ENCOUNTER — Other Ambulatory Visit: Payer: Self-pay | Admitting: Radiation Oncology

## 2020-02-14 ENCOUNTER — Other Ambulatory Visit: Payer: Self-pay

## 2020-02-14 VITALS — BP 148/85 | HR 81 | Temp 97.2°F | Resp 17 | Ht 62.0 in | Wt 180.7 lb

## 2020-02-14 DIAGNOSIS — Z803 Family history of malignant neoplasm of breast: Secondary | ICD-10-CM | POA: Diagnosis not present

## 2020-02-14 DIAGNOSIS — Z85048 Personal history of other malignant neoplasm of rectum, rectosigmoid junction, and anus: Secondary | ICD-10-CM | POA: Diagnosis not present

## 2020-02-14 DIAGNOSIS — Z923 Personal history of irradiation: Secondary | ICD-10-CM | POA: Diagnosis not present

## 2020-02-14 DIAGNOSIS — C210 Malignant neoplasm of anus, unspecified: Secondary | ICD-10-CM | POA: Diagnosis not present

## 2020-02-14 DIAGNOSIS — Z9221 Personal history of antineoplastic chemotherapy: Secondary | ICD-10-CM | POA: Diagnosis not present

## 2020-02-14 NOTE — Telephone Encounter (Addendum)
Left VM w/nurse that per PET scan appears she has recurrence of her anal cancer. Dr. Benay Spice is requesting Dr. Quentin Cornwall see her next week and perform anoscopy. Office note and PET report are in [place]. Requested return call to confirm message received. Mariann Laster w/Dr. Quentin Cornwall will call patient to get her in next week. She will have to come to [facility] next week.

## 2020-02-14 NOTE — Telephone Encounter (Signed)
Scheduled per los. Patient declined printout  

## 2020-02-14 NOTE — Progress Notes (Signed)
[place] OFFICE PROGRESS NOTE   Diagnosis: Anal cancer  INTERVAL HISTORY:   Ashley Moore returns as scheduled.  She reports a "scratchy "feeling with bowel movements.  No other complaint.  Objective:  Vital signs in last 24 hours:  Blood pressure (!) 148/85, pulse 81, temperature (!) 97.2 F (36.2 C), temperature source Temporal, resp. rate 17, height 5\' 2"  (1.575 m), weight 180 lb 11.2 oz (82 kg), SpO2 100 %.   Physical examination-not performed today  Lab Results:  Lab Results  Component Value Date   WBC 6.2 01/28/2020   HGB 12.2 01/28/2020   HCT 38.4 01/28/2020   MCV 87.7 01/28/2020   PLT 232 01/28/2020   NEUTROABS 4.5 01/28/2020    CMP  Lab Results  Component Value Date   NA 139 01/28/2020   K 3.5 01/28/2020   CL 105 01/28/2020   CO2 23 01/28/2020   GLUCOSE 135 (H) 01/28/2020   BUN 22 01/28/2020   CREATININE 0.96 01/28/2020   CALCIUM 9.6 01/28/2020   PROT 7.6 01/28/2020   ALBUMIN 3.8 01/28/2020   AST 35 01/28/2020   ALT 24 01/28/2020   ALKPHOS 68 01/28/2020   BILITOT 0.7 01/28/2020   GFRNONAA 60 (L) 01/28/2020   GFRAA >60 01/28/2020    Lab Results  Component Value Date   CEA1 1.08 11/14/2016      Imaging:  NM PET Image Restag (PS) Skull Base To Thigh  Result Date: 02/13/2020 CLINICAL DATA:  Subsequent treatment strategy for anal cancer initially diagnosed in 2018 with history chemotherapy and radiation therapy. Right nephroureteral stent placement for new right hydronephrosis and new retroperitoneal adenopathy on 01/28/2020 CT study. EXAM: NUCLEAR MEDICINE PET SKULL BASE TO THIGH TECHNIQUE: 9.3 mCi F-18 FDG was injected intravenously. Full-ring PET imaging was performed from the skull base to thigh after the radiotracer. CT data was obtained and used for attenuation correction and anatomic localization. Fasting blood glucose: 83 mg/dl COMPARISON:  01/28/2020 CT abdomen/pelvis. 06/27/2019 CT chest, abdomen and pelvis. 11/10/2016  PET-CT. FINDINGS: Mediastinal blood pool activity: SUV max 3.2 Liver activity: SUV max NA NECK: No hypermetabolic lymph nodes in the neck. Incidental CT findings: none CHEST: No enlarged or hypermetabolic axillary, mediastinal or hilar lymph nodes. No hypermetabolic pulmonary findings. Incidental CT findings: Coronary atherosclerosis. Mildly atherosclerotic nonaneurysmal thoracic aorta. No acute consolidative airspace disease, lung masses or significant pulmonary nodules. ABDOMEN/PELVIS: Hypermetabolic right retroperitoneal adenopathy throughout aortocaval and posterior paracaval chains, new since 11/10/2016 PET-CT. Representative posterior paracaval 2.2 cm node with max SUV 16.2 (series 4/image 114). Representative low aortocaval 1.7 cm node with max SUV 19.7 (series 4/image 120). No abnormal hypermetabolic activity within the liver, pancreas, adrenal glands, or spleen. No hypermetabolic recurrent inguinal or pelvic sidewall nodes. Nonspecific ill-defined nonfocal mild anal wall hypermetabolism with max SUV 5.8 with associated mild circumferential anorectal junction wall thickening. Incidental CT findings: Mild colonic diverticulosis. Right nephroureteral stent is well positioned. Minimal residual right hydronephrosis, decompressed from prior. SKELETON: No focal hypermetabolic activity to suggest skeletal metastasis. Incidental CT findings: none IMPRESSION: 1. Hypermetabolic right retroperitoneal nodal metastases in the aortocaval and posterior paracaval chains, new since 2018 PET-CT. 2. No hypermetabolic metastatic disease in the chest or skeleton. 3. Nonspecific ill-defined nonfocal mild anal wall hypermetabolism with mild anorectal junction wall thickening, which could represent post treatment change or recurrent anal neoplasm. 4. Well-positioned right nephroureteral stent with minimal residual right hydronephrosis, decompressed since 01/28/2020 CT. Electronically Signed   By: Ilona Sorrel M.D.   On: 02/13/2020  14:44    Medications: I have reviewed the patient's current medications.   Assessment/Plan: 1.  Anal cancer ? CT abdomen/pelvis 10/21/2016-thickening of the anus extending to the junction between the anus and rectum with a large stool ball in the rectum and mild fat stranding posterior to the rectum. Enlarged lymph nodes in the right and left inguinal regions. A few mildly prominent nodesseen posterior to the rectum. ? 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 ? PET scan [id number] hypermetabolic anal mass with hypermetabolic metastases to the groin region bilaterally, left pelvic sidewall and presacral space. ? Initiation of radiation and cycle 1 5-FU/mitomycin C 11/14/2016 ? Cycle 2 5-FU/mitomycin C 12/12/2016 (5-FU dose reduced due to mucositis, diarrhea, skin breakdown) ? Radiation completed 12/23/2016 ? CT abdomen/pelvis 04/14/2017-resolution of anal mass and bilateral inguinal lymphadenopathy. No residual tumor seen. ? CT abdomen/pelvis 01/28/2020-right hydroureteronephrosis, transition at the level of the mid right ureter, retroperitoneal lymphadenopathy ? CT renal stone study 01/28/2020-severe right hydronephrosis and proximal right hydroureter, 8 mm area of increased attenuation in the proximal right ureter-stone versus soft tissue mass ? PET 02/13/2020-hypermetabolic right retroperitoneal nodal metastases in the aortocaval and posterior pericaval chains, nonspecific mild anal wall hypermetabolism, right nephroureteral stent 2. Left labial lesions. Question direct extension from the anal cancer versus metastatic disease from anal cancer versus a separate malignant process. 3. History of pain and bleeding secondary to #1 and skin breakdown 4. 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 5. Multiple family members with breast cancer. 6. History of hypokalemia-likely secondary to decreased nutritional intake and  diarrhea; potassium in normal range 01/16/2017. No longer taking a potassium supplement. 7. Right hydroureteronephrosis on CT 01/28/2020, status post a cystoscopy/pyelogram 01/28/2020 confirming extrinsic compression of the right ureter, status post stent placement    Disposition: Ashley Moore was diagnosed with anal cancer in 2018.  She completed treatment with chemotherapy/radiation and entered clinical remission.  She now has evidence of recurrent anal cancer involving right retroperitoneal lymph nodes with secondary hydronephrosis.  She underwent placement of a right ureter stent with resolution of pain.  The staging PET scan reveals no other evidence of metastatic disease.  There is mild hypermetabolism at the anus.  I cannot palpate a mass at the anus on physical exam 01/31/2020.  We will ask Dr. Quentin Cornwall to perform an anal exam.  I discussed the case with Dr. Lisbeth Renshaw.  He will see Ashley Moore to discuss a course of definitive radiation.  We will likely administer concurrent chemotherapy.  I reviewed the PET images and discussed treatment options with Ashley Moore.  She agrees to the radiation oncology consult and follow-up with Dr. Quentin Cornwall.  She will return for an office visit during the week of 02/24/2020.  Betsy Coder, MD  02/14/2020  8:46 AM

## 2020-02-18 ENCOUNTER — Encounter: Payer: Self-pay | Admitting: Radiation Oncology

## 2020-02-18 ENCOUNTER — Other Ambulatory Visit: Payer: Self-pay

## 2020-02-18 ENCOUNTER — Other Ambulatory Visit: Payer: Self-pay | Admitting: Radiation Oncology

## 2020-02-18 ENCOUNTER — Ambulatory Visit
Admission: RE | Admit: 2020-02-18 | Discharge: 2020-02-18 | Disposition: A | Discharge status: Home / Self Care | Payer: Medicare HMO | Source: Ambulatory Visit | Attending: Radiation Oncology | Admitting: Radiation Oncology

## 2020-02-18 VITALS — BP 142/88 | HR 74 | Temp 97.4°F | Resp 20 | Ht 62.0 in | Wt 180.6 lb

## 2020-02-18 DIAGNOSIS — C210 Malignant neoplasm of anus, unspecified: Secondary | ICD-10-CM | POA: Insufficient documentation

## 2020-02-18 DIAGNOSIS — C778 Secondary and unspecified malignant neoplasm of lymph nodes of multiple regions: Secondary | ICD-10-CM | POA: Insufficient documentation

## 2020-02-18 DIAGNOSIS — R110 Nausea: Secondary | ICD-10-CM | POA: Insufficient documentation

## 2020-02-18 DIAGNOSIS — N952 Postmenopausal atrophic vaginitis: Secondary | ICD-10-CM | POA: Insufficient documentation

## 2020-02-18 DIAGNOSIS — C772 Secondary and unspecified malignant neoplasm of intra-abdominal lymph nodes: Secondary | ICD-10-CM | POA: Insufficient documentation

## 2020-02-18 DIAGNOSIS — F329 Major depressive disorder, single episode, unspecified: Secondary | ICD-10-CM | POA: Insufficient documentation

## 2020-02-18 DIAGNOSIS — I890 Lymphedema, not elsewhere classified: Secondary | ICD-10-CM | POA: Insufficient documentation

## 2020-02-18 DIAGNOSIS — Z85048 Personal history of other malignant neoplasm of rectum, rectosigmoid junction, and anus: Secondary | ICD-10-CM | POA: Diagnosis not present

## 2020-02-18 DIAGNOSIS — Z510 Encounter for antineoplastic radiation therapy: Secondary | ICD-10-CM | POA: Insufficient documentation

## 2020-02-18 DIAGNOSIS — Z923 Personal history of irradiation: Secondary | ICD-10-CM | POA: Diagnosis not present

## 2020-02-18 MED ORDER — LORAZEPAM 0.5 MG PO TABS
ORAL_TABLET | ORAL | 0 refills | Status: DC
Start: 2020-02-18 — End: 2020-09-02

## 2020-02-18 MED ORDER — ONDANSETRON HCL 8 MG PO TABS
8.0000 mg | ORAL_TABLET | Freq: Three times a day (TID) | ORAL | 0 refills | Status: DC | PRN
Start: 2020-02-18 — End: 2020-02-20
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
## 2020-02-19 ENCOUNTER — Other Ambulatory Visit: Payer: Self-pay | Admitting: *Deleted

## 2020-02-19 ENCOUNTER — Ambulatory Visit: Payer: Medicare HMO | Attending: Radiation Oncology | Admitting: Rehabilitation

## 2020-02-19 ENCOUNTER — Ambulatory Visit
Admission: RE | Admit: 2020-02-19 | Discharge: 2020-02-19 | Disposition: A | Discharge status: Home / Self Care | Payer: Medicare HMO | Source: Ambulatory Visit | Attending: Radiation Oncology | Admitting: Radiation Oncology

## 2020-02-19 ENCOUNTER — Encounter: Payer: Self-pay | Admitting: Rehabilitation

## 2020-02-19 ENCOUNTER — Other Ambulatory Visit: Payer: Self-pay

## 2020-02-19 DIAGNOSIS — I890 Lymphedema, not elsewhere classified: Secondary | ICD-10-CM | POA: Diagnosis not present

## 2020-02-19 DIAGNOSIS — C772 Secondary and unspecified malignant neoplasm of intra-abdominal lymph nodes: Secondary | ICD-10-CM | POA: Diagnosis not present

## 2020-02-19 DIAGNOSIS — R279 Unspecified lack of coordination: Secondary | ICD-10-CM | POA: Diagnosis not present

## 2020-02-19 DIAGNOSIS — L599 Disorder of the skin and subcutaneous tissue related to radiation, unspecified: Secondary | ICD-10-CM | POA: Diagnosis not present

## 2020-02-19 DIAGNOSIS — R252 Cramp and spasm: Secondary | ICD-10-CM | POA: Insufficient documentation

## 2020-02-19 DIAGNOSIS — M6281 Muscle weakness (generalized): Secondary | ICD-10-CM | POA: Insufficient documentation

## 2020-02-19 DIAGNOSIS — R110 Nausea: Secondary | ICD-10-CM | POA: Diagnosis not present

## 2020-02-19 DIAGNOSIS — Z483 Aftercare following surgery for neoplasm: Secondary | ICD-10-CM | POA: Diagnosis not present

## 2020-02-19 DIAGNOSIS — C210 Malignant neoplasm of anus, unspecified: Secondary | ICD-10-CM | POA: Diagnosis not present

## 2020-02-19 DIAGNOSIS — N952 Postmenopausal atrophic vaginitis: Secondary | ICD-10-CM | POA: Diagnosis not present

## 2020-02-19 DIAGNOSIS — F329 Major depressive disorder, single episode, unspecified: Secondary | ICD-10-CM | POA: Diagnosis not present

## 2020-02-19 DIAGNOSIS — C778 Secondary and unspecified malignant neoplasm of lymph nodes of multiple regions: Secondary | ICD-10-CM | POA: Diagnosis not present

## 2020-02-19 DIAGNOSIS — Z510 Encounter for antineoplastic radiation therapy: Secondary | ICD-10-CM | POA: Diagnosis not present

## 2020-02-19 NOTE — Therapy (Signed)
[place], [place], [address] Phone: [phone]   Fax:  [phone]  Physical Therapy Treatment  Patient Details  Name: Ashley Moore MRN: [id number] Date of Birth: January 11, 1951 Referring Provider (PT): Shona Simpson PA-C   Encounter Date: 02/19/2020  PT End of Session - 02/19/20 1700    Visit Number  1    Number of Visits  12    Date for PT Re-Evaluation  04/01/20    Authorization Type  [employer] Medicare needs auth    PT Start Time  1506    PT Stop Time  1550    PT Time Calculation (min)  44 min    Activity Tolerance  Patient tolerated treatment well    Behavior During Therapy  [facility] for tasks assessed/performed       Past Medical History:  Diagnosis Date  . Bowen's disease    excised 1992  . Family history of breast cancer   . Family history of lung cancer   . Family history of non-Hodgkin's lymphoma   . Family history of ovarian cancer   . Headache   . Hx of varicella   . 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([place])   . Thyroid cancer [facility])     Past Surgical History:  Procedure Laterality Date  . BUNIONECTOMY Right   . CYSTOSCOPY WITH RETROGRADE PYELOGRAM, URETEROSCOPY AND STENT PLACEMENT Right 01/28/2020   Procedure: CYSTOSCOPY WITH RETROGRADE PYELOGRAM, URETEROSCOPY AND STENT PLACEMENT;  Surgeon: Alexis Frock, MD;  Location: [facility];  Service: Urology;  Laterality: Right;  . IR GENERIC HISTORICAL  11/14/2016   IR US GUIDE VASC ACCESS RIGHT 11/14/2016 [facility]-INTER[facility] RAD  . IR GENERIC HISTORICAL  11/14/2016   IR FLUORO GUIDE CV LINE RIGHT 11/14/2016 [facility]  . IR GENERIC HISTORICAL  12/12/2016   IR US GUIDE VASC ACCESS RIGHT 12/12/2016 [name], MD [facility]-INTERV RAD  . IR GENERIC HISTORICAL  12/12/2016   IR FLUORO GUIDE CV LINE RIGHT 12/12/2016 [name], MD [facility]-INTERV RAD  . SKIN CANCER EXCISION     bowens disease    There were no vitals filed for this visit.  Subjective Assessment - 02/19/20 1504     Subjective  I maybe wore those wraps 2-3 times.  I am starting to elevate more.    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 Pt initially diagnosed in 2018 with history of Chemotherapy and radiation.  Rt nephroureteral stent in 2021 due to hydronephrosis now decompressed.  Radiation will be performed again most likely with concurrent chemotherapy    Patient Stated Goals  I'm not sure but I am going to be doing radiation again    Currently in Pain?  No/denies         [facility] PT Assessment - 02/19/20 0001      Assessment   Medical Diagnosis  anal cancer    Referring Provider (PT)  Shona Simpson PA-C    Onset Date/Surgical Date  09/19/16    Hand Dominance  Left    Prior Therapy  yes      Precautions   Precaution Comments  lymphedema history bil LE Lt>Rt, genital lymphedema, history of anal region radiation, active cancer      Restrictions   Weight Bearing Restrictions  No      Balance Screen   Has the patient fallen in the past 6 months  No    Has the patient had a decrease in activity level because of  a fear of falling?   No    Is the patient reluctant to leave their home because of a fear of falling?   No      Home Environment   Living Environment  Private residence    Living Arrangements  Alone    Available Help at Discharge  Family    Type of [place]      Prior Function   Level of Independence  Independent    Vocation  Full time employment    Vocation Requirements  Runs a convenience store    Leisure  no time      Cognition   Overall Cognitive Status  Within Functional Limits for tasks assessed      Coordination   Gross Motor Movements are Fluid and Coordinated  Yes      Posture/Postural Control   Posture/Postural Control  No significant limitations      Ambulation/Gait   Gait Comments  Gait without AD WNL        LYMPHEDEMA/ONCOLOGY QUESTIONNAIRE - 02/19/20 1516      Type   Cancer Type  Anal cancer      Treatment    Active Chemotherapy Treatment  No    Past Chemotherapy Treatment  Yes    Active Radiation Treatment  No    Past Radiation Treatment  No    Current Hormone Treatment  No    Past Hormone Therapy  No      What other symptoms do you have   Are you Having Heaviness or Tightness  Yes    Are you having Pain  Yes    Are you having pitting edema  Yes    Body Site  bil legs from ankle to knee 2+      Lymphedema Assessments   Lymphedema Assessments  Lower extremities      Right Lower Extremity Lymphedema   10 cm Proximal to Suprapatella  53 cm   seated   At Midpatella/Popliteal Crease  46 cm   seated   30 cm Proximal to Floor at Lateral Plantar Foot  42 cm    20 cm Proximal to Floor at Lateral Plantar Foot  35.7 1    10  cm Proximal to Floor at Lateral Malleoli  25 cm    5 cm Proximal to 1st MTP Joint  22.6 cm    Across MTP Joint  20.5 cm    Around Proximal Great Toe  7.5 cm      Left Lower Extremity Lymphedema   10 cm Proximal to Suprapatella  53.5 cm   seated   At Midpatella/Popliteal Crease  47.5 cm   seated   30 cm Proximal to Floor at Lateral Plantar Foot  43 cm    20 cm Proximal to Floor at Lateral Plantar Foot  34.8 cm    10 cm Proximal to Floor at Lateral Malleoli  23.8 cm    5 cm Proximal to 1st MTP Joint  23.2 cm    Across MTP Joint  22.4 cm    Around Proximal Great Toe  7.5 cm                         PT Education - 02/19/20 1659    Education Details  POC    Person(s) Educated  Patient    Methods  Explanation    Comprehension  Verbalized understanding          PT Long Term Goals -  02/19/20 1710      PT LONG TERM GOAL #1   Title  Pt will be ind with self care for bilateral LE lymphedema to include use of compression garments and MLD and/or compression pump    Time  6    Period  Weeks    Status  New      PT LONG TERM GOAL #2   Title  Pt will report decreased edema in the ankle after working by at least 50% to demonstrate improved lymphatic  flow    Time  6    Period  Weeks    Status  New            Plan - 02/19/20 1701    Clinical Impression Statement  Pt return to therapy after 3 years due to radiation in the pelvic region reporting that it is overall better and under control but that she will be starting radiation and chemotherapy again 03/03/20 and is worried about the swelling in the feet.  Pt will be getting radiation to the Rt kidney region due to mets or new primary. Pt was here previously but reports never using her farrow wraps or performing self MLD but that she currently uses copperfit compression knee highs that work well.  Pt does have pitting edema present from the malleoli to 2-3 inches below the knee bilaterally.  Pt also has genital lymphedema/pubic region continued swelling and hardness as well as skin issues and frequent UTIs from radiation.  Discussed a few options today including genital swell spot, use of pads for compression with compression shorts, using capris with knee highs for more area covered, and discussed compression pump and vibration.  Pt is agreeable to trying MLD in clinic to see if self MLD and/or pump is benefifcial.  Pt does not seem interested in new compression garmnets at this time reporting she has good stockings, spanx garments.    Personal Factors and Comorbidities  Past/Current Experience;Comorbidity 2    Comorbidities  radiation history, active cancer    Examination-Activity Limitations  Stand    Examination-Participation Restrictions  Community Activity    Stability/Clinical Decision Making  Stable/Uncomplicated    Clinical Decision Making  Low    Rehab Potential  Excellent    PT Frequency  2x / week    PT Duration  6 weeks    PT Treatment/Interventions  ADLs/Self Care Home Management;Therapeutic exercise;Patient/family education;Manual lymph drainage;Manual techniques;Passive range of motion    PT Next Visit Plan  single leg MLD using inguinals and axillary nodes to test tolerance  before performing bilateral, keep monitoring compression needs but pt seems happy with current, pump?    Consulted and Agree with Plan of Care  Patient       Patient will benefit from skilled therapeutic intervention in order to improve the following deficits and impairments:  Decreased skin integrity, Increased edema  Visit Diagnosis: Lymphedema  Disorder of the skin and subcutaneous tissue related to radiation, unspecified     Problem List Patient Active Problem List   Diagnosis Date Noted  . 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 Genetic testing 01/27/2020  . Family history of breast cancer   . Family history of lung cancer   . Family history of ovarian cancer   . Family history of non-Hodgkin's lymphoma   . 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 Port catheter in place 11/21/2016  . 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 Anal cancer ([place]) 11/03/2016  .  Cystitis 05/06/2015  . Degenerative cervical disc 04/08/2015  . Trigger point of neck 03/26/2015  . Pain in joint, ankle and foot 04/23/2013  . Visit for preventive health examination 02/13/2013  . Routine gynecological examination 02/13/2013  . Family history of breast cancer in first degree relative 02/13/2013  . Rash and nonspecific skin eruption 02/13/2013  . 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 VITAMIN D DEFICIENCY 06/25/2008  . NECK PAIN 06/02/2008  . FOOT PAIN 06/02/2008  . OSTEOPENIA 06/02/2008    Stark Bray 02/19/2020, 5:11 PM  [place], [place], [address] Phone: [phone]   Fax:  [phone]  Name: TAMILYN OBERBROECKLING MRN: [id number] Date of Birth: 03-06-1951

## 2020-02-19 NOTE — Progress Notes (Signed)
Per MD message: Needs PICC to begin mitomycin/5FU pump on 03/02/20. Order placed and scheduling message sent to add lab/flush and chemo appointment to 03/02/20

## 2020-02-20 ENCOUNTER — Other Ambulatory Visit: Payer: Self-pay | Admitting: Radiation Oncology

## 2020-02-21 ENCOUNTER — Other Ambulatory Visit: Payer: Self-pay

## 2020-02-21 ENCOUNTER — Ambulatory Visit: Payer: Medicare HMO | Admitting: Rehabilitation

## 2020-02-21 ENCOUNTER — Encounter: Payer: Self-pay | Admitting: Rehabilitation

## 2020-02-21 DIAGNOSIS — M6281 Muscle weakness (generalized): Secondary | ICD-10-CM

## 2020-02-21 DIAGNOSIS — C210 Malignant neoplasm of anus, unspecified: Secondary | ICD-10-CM | POA: Diagnosis not present

## 2020-02-21 DIAGNOSIS — Z483 Aftercare following surgery for neoplasm: Secondary | ICD-10-CM

## 2020-02-21 DIAGNOSIS — I890 Lymphedema, not elsewhere classified: Secondary | ICD-10-CM | POA: Diagnosis not present

## 2020-02-21 DIAGNOSIS — K624 Stenosis of anus and rectum: Secondary | ICD-10-CM | POA: Diagnosis not present

## 2020-02-21 DIAGNOSIS — R279 Unspecified lack of coordination: Secondary | ICD-10-CM | POA: Diagnosis not present

## 2020-02-21 DIAGNOSIS — R252 Cramp and spasm: Secondary | ICD-10-CM

## 2020-02-21 DIAGNOSIS — L599 Disorder of the skin and subcutaneous tissue related to radiation, unspecified: Secondary | ICD-10-CM

## 2020-02-21 NOTE — Patient Instructions (Signed)
Deep Effective Breath   Standing, sitting, or laying down place both hands on the belly. Take a deep breath IN, expanding the belly; then breath OUT, contracting the belly. Repeat __5__ times.    Inguinal Nodes to Axilla - Clear   On both sides, at armpit, make _5__ in-place circles. Then from hip proceed in sections to armpit with stationary circles or pumps _5_ times, this is your pathway. Do _1__ time per day.  Make circles in both groins where the leg meets the body.  5-10 times  Copyright  VHI. All rights reserved.  LEG: Knee to Hip - Clear   Pump up outer thigh of involved leg from knee to outer hip.   Then do stationary circles from inner to outer thigh, then do outer thigh again.   Next, interlace fingers behind knee IF ABLE and make in-place circles. Do _5_ times of each sequence.     LEG: Ankle to Hip Sweep   Hands on sides of ankle of involved leg, pump _5__ times up both sides of lower leg,   then retrace steps up outer thigh to hip as before and back to pathway. Do _2-3_ times. .  Copyright  VHI. All rights reserved.  FOOT: Dorsum of Foot and Toes Massage   One hand on top of foot make _5_ stationary circles or pumps,   then either on top of toes or each individual toe do _5_ pumps.   Then retrace all steps pumping back up both sides of lower leg, outer thigh, and then pathway. Finish with what you started with, _5_ circles at involved side arm pit. All _2-3_ times at each sequence. Do _1__ time per day.  Copyright  VHI. All rights reserved.

## 2020-02-21 NOTE — Therapy (Signed)
[place], [place], [address] Phone: [phone]   Fax:  [phone]  Physical Therapy Treatment  Patient Details  Name: Ashley Moore MRN: [id number] Date of Birth: [birth year] Referring Provider (PT): Shona Simpson PA-C   Encounter Date: 02/21/2020  PT End of Session - 02/21/20 2136    Visit Number  2    Number of Visits  12    Date for PT Re-Evaluation  04/01/20    Authorization Type  [employer]    Authorization - Visit Number  1    Authorization - Number of Visits  6   ?   PT Start Time  1100    PT Stop Time  1156    PT Time Calculation (min)  56 min    Activity Tolerance  Patient tolerated treatment well    Behavior During Therapy  WFL for tasks assessed/performed       Past Medical History:  Diagnosis Date  . Bowen's disease    excised 1992  . Family history of breast cancer   . Family history of lung cancer   . Family history of non-Hodgkin's lymphoma   . Family history of ovarian cancer   . Headache   . Hx of varicella   . 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([place])   . Thyroid cancer [facility])     Past Surgical History:  Procedure Laterality Date  . BUNIONECTOMY Right   . CYSTOSCOPY WITH RETROGRADE PYELOGRAM, URETEROSCOPY AND STENT PLACEMENT Right 01/28/2020   Procedure: CYSTOSCOPY WITH RETROGRADE PYELOGRAM, URETEROSCOPY AND STENT PLACEMENT;  Surgeon: Alexis Frock, MD;  [facility];  Service: Urology;  Laterality: Right;  . IR GENERIC HISTORICAL  11/14/2016   IR US GUIDE VASC ACCESS RIGHT 11/14/2016 [facility]INTER[facility]  . IR GENERIC HISTORICAL  11/14/2016   IR FLUORO GUIDE CV LINE RIGHT 11/14/2016 [facility]INTER[facility]  . IR GENERIC HISTORICAL  12/12/2016   IR US GUIDE VASC ACCESS RIGHT 12/12/2016 [name], MD [facility]-INTERV RAD  . IR GENERIC HISTORICAL  12/12/2016   IR FLUORO GUIDE CV LINE RIGHT 12/12/2016 [name], MD [facility]-INTER[facility]  . SKIN CANCER EXCISION     bowens disease    There were no vitals filed for  this visit.  Subjective Assessment - 02/21/20 1059    Subjective  My back is sore.  The bottoms of my feet are so painful.    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 Pt initially diagnosed in 2018 with history of Chemotherapy and radiation.  Rt nephroureteral stent in 2021 due to hydronephrosis now decompressed.  Radiation will be performed again most likely with concurrent chemotherapy    Patient Stated Goals  I'm not sure but I am going to be doing radiation again    Currently in Pain?  Yes    Pain Score  4     Pain Location  Back    Pain Orientation  Right    Pain Descriptors / Indicators  Aching    Pain Type  Surgical pain    Pain Onset  More than a month ago    Pain Frequency  Intermittent         [facility] PT Assessment - 02/21/20 0001      Observation/Other Assessments   Observations  assessed standing posture without shoes with pt demonstrating signifcant Rt foot pronation, tibial IR, genu valgus, and Rt hip appearing lower.  Lt foot pronation but not as signifacnt.  Pt educated  on getting inserts from good feet store or off and running                     [facility] Adult PT Treatment/Exercise - 02/21/20 0001      Manual Therapy   Manual Therapy  Manual Lymphatic Drainage (MLD);Edema management    Edema Management  Education on lipedema type micromassaging leggings that can be purchased on [employer] as well as body helix ankle arch supports    Manual Lymphatic Drainage (MLD)  in supine HOB elevated and bil legs elevated on wedge; Lt axillary nodes, Lt inguinal axillary anastamosis, Lt leg from hip to anterior foot with focus on pockets around the malleoli and deeper distal to proximal pressure at the lower leg at the pitting.  Then in seated reviewing self MLD strategies and positions with handout given.                    PT Long Term Goals - 02/19/20 1710      PT LONG TERM GOAL #1   Title  Pt will be ind with self care  for bilateral LE lymphedema to include use of compression garments and MLD and/or compression pump    Time  6    Period  Weeks    Status  New      PT LONG TERM GOAL #2   Title  Pt will report decreased edema in the ankle after working by at least 50% to demonstrate improved lymphatic flow    Time  6    Period  Weeks    Status  New            Plan - 02/21/20 2138    Clinical Impression Statement  First session of MLD for the LEs today with intial treatment to the left leg which gives pt more trouble.  Pt able to notice a considerable difference at the lower leg and ankle post treatment and is encouraged by this.  Pt was taught/reviewed self MLD from previous sessions and was given information on how to order lipedema type compression leggings to try at work.  Will include the Rt LE next session    PT Frequency  2x / week    PT Duration  6 weeks    PT Treatment/Interventions  ADLs/Self Care Home Management;Therapeutic exercise;Patient/family education;Manual lymph drainage;Manual techniques;Passive range of motion    PT Next Visit Plan  how was treatment tolerated? bil leg MLD using axillary nodes, keep monitoring compression needs but pt seems happy with current, pump?    PT Home Exercise Plan  self MLD    Consulted and Agree with Plan of Care  Patient       Patient will benefit from skilled therapeutic intervention in order to improve the following deficits and impairments:     Visit Diagnosis: Lymphedema  Disorder of the skin and subcutaneous tissue related to radiation, unspecified  Muscle weakness (generalized)  Cramp and spasm  Unspecified lack of coordination  Aftercare following surgery for neoplasm     Problem List Patient Active Problem List   Diagnosis Date Noted  . 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 Genetic testing 01/27/2020  . Family history of breast cancer   . Family history of lung cancer   . Family history of ovarian  cancer   . Family history of non-Hodgkin's lymphoma   . 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 Port catheter in place 11/21/2016  . Secondary malignant neoplasm  of vulva ([place]) 11/14/2016  . Anal cancer ([place]) 11/03/2016  . Cystitis 05/06/2015  . Degenerative cervical disc 04/08/2015  . Trigger point of neck 03/26/2015  . Pain in joint, ankle and foot 04/23/2013  . Visit for preventive health examination 02/13/2013  . Routine gynecological examination 02/13/2013  . Family history of breast cancer in first degree relative 02/13/2013  . Rash and nonspecific skin eruption 02/13/2013  . 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 VITAMIN D DEFICIENCY 06/25/2008  . NECK PAIN 06/02/2008  . FOOT PAIN 06/02/2008  . OSTEOPENIA 06/02/2008    Stark Bray 02/21/2020, 9:42 PM  [place], [place], [address] Phone: [phone]   Fax:  [phone]  Name: Ashley Moore MRN: [id number] Date of Birth: 07-09-1951

## 2020-02-25 ENCOUNTER — Inpatient Hospital Stay: Payer: Medicare HMO | Attending: Gynecologic Oncology | Admitting: Nurse Practitioner

## 2020-02-25 ENCOUNTER — Other Ambulatory Visit: Payer: Self-pay

## 2020-02-25 ENCOUNTER — Ambulatory Visit: Payer: Medicare HMO

## 2020-02-25 VITALS — BP 145/80 | HR 76 | Temp 97.5°F | Resp 18 | Ht 62.0 in | Wt 182.5 lb

## 2020-02-25 DIAGNOSIS — C210 Malignant neoplasm of anus, unspecified: Secondary | ICD-10-CM | POA: Insufficient documentation

## 2020-02-25 DIAGNOSIS — C772 Secondary and unspecified malignant neoplasm of intra-abdominal lymph nodes: Secondary | ICD-10-CM | POA: Diagnosis not present

## 2020-02-25 DIAGNOSIS — R309 Painful micturition, unspecified: Secondary | ICD-10-CM | POA: Insufficient documentation

## 2020-02-25 DIAGNOSIS — Z923 Personal history of irradiation: Secondary | ICD-10-CM | POA: Diagnosis not present

## 2020-02-25 MED ORDER — CAPECITABINE 500 MG PO TABS
ORAL_TABLET | ORAL | 0 refills | Status: DC
Start: 2020-02-25 — End: 2020-02-26

## 2020-02-25 NOTE — Progress Notes (Signed)
[place] OFFICE PROGRESS NOTE   Diagnosis: Anal cancer  INTERVAL HISTORY:   Ashley Moore returns as scheduled.  She continues to have pain with urination, blood periodically.  No nausea or vomiting.  Bowels are moving.  Fatigues easily.  Objective:  Vital signs in last 24 hours:  Blood pressure (!) 145/80, pulse 76, temperature (!) 97.5 F (36.4 C), temperature source Temporal, resp. rate 18, height 5\' 2"  (1.575 m), weight 182 lb 8 oz (82.8 kg), SpO2 100 %.    GI: Abdomen soft.  Tender at the right upper abdomen.  No hepatomegaly. Vascular: No leg edema. Neuro: Alert and oriented.   Lab Results:  Lab Results  Component Value Date   WBC 6.2 01/28/2020   HGB 12.2 01/28/2020   HCT 38.4 01/28/2020   MCV 87.7 01/28/2020   PLT 232 01/28/2020   NEUTROABS 4.5 01/28/2020    Imaging:  No results found.  Medications: I have reviewed the patient's current medications.  Assessment/Plan: 1.  Anal cancer ? CT abdomen/pelvis 10/21/2016-thickening of the anus extending to the junction between the anus and rectum with a large stool ball in the rectum and mild fat stranding posterior to the rectum. Enlarged lymph nodes in the right and left inguinal regions. A few mildly prominent nodesseen posterior to the rectum. ? 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 ? PET scan 05/02/4817-HUDJSHFW hypermetabolic anal mass with hypermetabolic metastases to the groin region bilaterally, left pelvic sidewall and presacral space. ? Initiation of radiation and cycle 1 5-FU/mitomycin C 11/14/2016 ? Cycle 2 5-FU/mitomycin C 12/12/2016 (5-FU dose reduced due to mucositis, diarrhea, skin breakdown) ? Radiation completed 12/23/2016 ? CT abdomen/pelvis 04/14/2017-resolution of anal mass and bilateral inguinal lymphadenopathy. No residual tumor seen. ? CT abdomen/pelvis 01/28/2020-right hydroureteronephrosis, transition at the level of the mid right ureter,  retroperitoneal lymphadenopathy ? CT renal stone study 01/28/2020-severe right hydronephrosis and proximal right hydroureter, 8 mm area of increased attenuation in the proximal right ureter-stone versus soft tissue mass ? PET 02/13/2020-hypermetabolic right retroperitoneal nodal metastases in the aortocaval and posterior pericaval chains, nonspecific mild anal wall hypermetabolism, right nephroureteral stent ? 02/21/2020 anorectal exam per Dr. Ashley Jacobs radiation changes of the skin around the anal margin.  Posterior midline smooth scarring.  Mildly stenotic.  Stenosis seems better.  No palpable concerning lesions.  Entire anal canal and distal rectum feels smooth and healthy.  Partial anoscopy performed with no lesions in the anal canal. 2. Left labial lesions. Question direct extension from the anal cancer versus metastatic disease from anal cancer versus a separate malignant process. 3. History of pain and bleeding secondary to #1 and skin breakdown 4. 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 5. Multiple family members with breast cancer. 6. History of hypokalemia-likely secondary to decreased nutritional intake and diarrhea; potassium in normal range 01/16/2017. No longer taking a potassium supplement. 7. 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  The recommendation is to complete a course of definitive radiation and concurrent chemotherapy.  Dr. Benay Spice discussed chemotherapy options to include 5-FU/mitomycin-C versus Xeloda.  She received 5-FU/mitomycin-C in the past.  Potential toxicities again reviewed.  We discussed potential toxicities associated with Xeloda including mouth sores, diarrhea, hand-foot syndrome, skin rash,  skin hyperpigmentation, increased sensitivity to sun.  She would like to proceed  with Xeloda.  She is scheduled to begin radiation on 03/02/2020.  She will take Xeloda on the days of radiation.  She will return for follow-up as scheduled on 03/02/2020.   Patient seen with Dr. Benay Spice.  Ned Card ANP/GNP-BC   02/25/2020  10:56 AM This was a shared visit with Ned Card.  We discussed treatment options with Ashley Moore again today.  She is reluctant to proceed with placement of a PICC and repeat 5-FU/mitomycin-C treatment.  We discussed capecitabine.  I explained this is a good alternative option to infusional 5-fluorouracil.  We reviewed potential toxicities associated with capecitabine.  She agrees to proceed with capecitabine and concurrent radiation.  She understands the goal of systemic therapy is to sensitize the metastatic disease to radiation.  The goal of treatment is to decrease the metastatic tumor burden and potentially prolong disease-free and overall survival.  There is a chance of the treatment being curative.  Julieanne Manson, MD

## 2020-02-26 ENCOUNTER — Telehealth: Payer: Self-pay | Admitting: Pharmacist

## 2020-02-26 ENCOUNTER — Ambulatory Visit: Payer: Medicare HMO

## 2020-02-26 ENCOUNTER — Telehealth: Payer: Self-pay

## 2020-02-26 DIAGNOSIS — N952 Postmenopausal atrophic vaginitis: Secondary | ICD-10-CM | POA: Diagnosis not present

## 2020-02-26 DIAGNOSIS — C210 Malignant neoplasm of anus, unspecified: Secondary | ICD-10-CM | POA: Diagnosis not present

## 2020-02-26 DIAGNOSIS — Z510 Encounter for antineoplastic radiation therapy: Secondary | ICD-10-CM | POA: Diagnosis not present

## 2020-02-26 DIAGNOSIS — F329 Major depressive disorder, single episode, unspecified: Secondary | ICD-10-CM | POA: Diagnosis not present

## 2020-02-26 DIAGNOSIS — I890 Lymphedema, not elsewhere classified: Secondary | ICD-10-CM | POA: Diagnosis not present

## 2020-02-26 DIAGNOSIS — C772 Secondary and unspecified malignant neoplasm of intra-abdominal lymph nodes: Secondary | ICD-10-CM | POA: Diagnosis not present

## 2020-02-26 DIAGNOSIS — C778 Secondary and unspecified malignant neoplasm of lymph nodes of multiple regions: Secondary | ICD-10-CM | POA: Diagnosis not present

## 2020-02-26 DIAGNOSIS — R110 Nausea: Secondary | ICD-10-CM | POA: Diagnosis not present

## 2020-02-26 MED ORDER — CAPECITABINE 500 MG PO TABS: 1500.0000 mg | ORAL_TABLET | Freq: Two times a day (BID) | ORAL | 0 refills | Status: DC

## 2020-02-26 NOTE — Telephone Encounter (Signed)
Oral Oncology Patient Advocate Encounter  Received notification from [facility] that prior authorization for Xeloda is required.  PA submitted on CoverMyMeds Key [id number] Status is pending  Oral Oncology Clinic will continue to follow.   [place] Patient [place] Phone [phone] Fax [phone] 02/26/2020 1:39 PM

## 2020-02-26 NOTE — Telephone Encounter (Signed)
Oral Oncology Pharmacist Encounter  Received new prescription for Xeloda (capecitabine) for the treatment of recurrent anal cancer in conjunction with RT, planned duration until the end of RT. Planned start 03/02/20.  CMP from 01/28/20 assessed, no relevant lab abnormalities. Prescription dose and frequency assessed.   Current medication list in Epic reviewed, no relevant DDIs with capecitabine identified.  Prescription has been e-scribed to the [facility] for benefits analysis and approval.  Oral Oncology Clinic will continue to follow for insurance authorization, copayment issues, initial counseling and start date.  Darl Pikes, PharmD, BCPS, BCOP, CPP Hematology/Oncology Clinical Pharmacist Practitioner [facility] [phone]  02/26/2020 2:40 PM

## 2020-02-27 ENCOUNTER — Ambulatory Visit: Payer: Medicare HMO

## 2020-02-27 ENCOUNTER — Telehealth: Payer: Self-pay

## 2020-02-27 NOTE — Telephone Encounter (Signed)
Oral Oncology Patient Advocate Encounter  Prior Authorization for Xeloda has been approved through Medicare B  PA# [id number] Effective dates: 02/26/20 through 09/18/20  Patients co-pay is $26.09  Oral Oncology Clinic will continue to follow.   [name] [place] Phone [phone] Fax [phone] 02/27/2020 8:11 AM

## 2020-02-27 NOTE — Telephone Encounter (Signed)
Spoke with pt regarding where to pick up her Xeloda rx. Pt verbalizes understanding and agrees with plan of care.

## 2020-02-28 ENCOUNTER — Other Ambulatory Visit: Payer: Self-pay

## 2020-02-28 ENCOUNTER — Ambulatory Visit: Payer: Medicare HMO

## 2020-02-28 DIAGNOSIS — I890 Lymphedema, not elsewhere classified: Secondary | ICD-10-CM

## 2020-02-28 DIAGNOSIS — L599 Disorder of the skin and subcutaneous tissue related to radiation, unspecified: Secondary | ICD-10-CM | POA: Diagnosis not present

## 2020-02-28 DIAGNOSIS — R279 Unspecified lack of coordination: Secondary | ICD-10-CM | POA: Diagnosis not present

## 2020-02-28 DIAGNOSIS — M6281 Muscle weakness (generalized): Secondary | ICD-10-CM

## 2020-02-28 DIAGNOSIS — Z483 Aftercare following surgery for neoplasm: Secondary | ICD-10-CM

## 2020-02-28 DIAGNOSIS — R252 Cramp and spasm: Secondary | ICD-10-CM | POA: Diagnosis not present

## 2020-02-28 NOTE — Therapy (Signed)
[place], [place], [address] Phone: [phone]   Fax:  [phone]  Physical Therapy Treatment  Patient Details  Name: Ashley Moore MRN: [id number] Date of Birth: 18-Nov-1950 Referring Provider (PT): Shona Simpson PA-C   Encounter Date: 02/28/2020   PT End of Session - 02/28/20 1101    Visit Number 3    Number of Visits 12    Date for PT Re-Evaluation 04/01/20    Authorization Type [employer]    Authorization - Visit Number 2    Authorization - Number of Visits 6    PT Start Time 1101    PT Stop Time 1210    PT Time Calculation (min) 69 min    Activity Tolerance Patient tolerated treatment well    Behavior During Therapy [facility] for tasks assessed/performed           Past Medical History:  Diagnosis Date  . Bowen's disease    excised 1992  . Family history of breast cancer   . Family history of lung cancer   . Family history of non-Hodgkin's lymphoma   . Family history of ovarian cancer   . Headache   . Hx of varicella   . 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([place])   . Thyroid cancer [facility])     Past Surgical History:  Procedure Laterality Date  . BUNIONECTOMY Right   . CYSTOSCOPY WITH RETROGRADE PYELOGRAM, URETEROSCOPY AND STENT PLACEMENT Right 01/28/2020   Procedure: CYSTOSCOPY WITH RETROGRADE PYELOGRAM, URETEROSCOPY AND STENT PLACEMENT;  Surgeon: Alexis Frock, MD;  Location: [facility];  Service: Urology;  Laterality: Right;  . IR GENERIC HISTORICAL  11/14/2016   IR US GUIDE VASC ACCESS RIGHT 11/14/2016 [facility]  . IR GENERIC HISTORICAL  11/14/2016   IR FLUORO GUIDE CV LINE RIGHT 11/14/2016 [facility]  . IR GENERIC HISTORICAL  12/12/2016   IR US GUIDE VASC ACCESS RIGHT 12/12/2016 [name], MD [facility]INTER[facility]  . IR GENERIC HISTORICAL  12/12/2016   IR FLUORO GUIDE CV LINE RIGHT 12/12/2016 [name], MD [facility]  . SKIN CANCER EXCISION     bowens disease    There were no vitals filed for this  visit.   Subjective Assessment - 02/28/20 1101    Subjective Pt states that she has come to the conclusion that her lymphedema socks are making her feet sore. She had to stop a medication yesterday so her back pain is bad today.    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 Pt initially diagnosed in 2018 with history of Chemotherapy and radiation.  Rt nephroureteral stent in 2021 due to hydronephrosis now decompressed.  Radiation will be performed again most likely with concurrent chemotherapy    Patient Stated Goals I'm not sure but I am going to be doing radiation again    Currently in Pain? Yes    Pain Score 7     Pain Location Back    Pain Orientation Right    Pain Descriptors / Indicators Aching;Sharp    Pain Type Surgical pain    Pain Onset More than a month ago    Pain Frequency Intermittent    Aggravating Factors  fatigue    Pain Relieving Factors pressing on it    Effect of Pain on Daily Activities Pt works through the pain.  [facility] Adult PT Treatment/Exercise - 02/28/20 0001      Manual Therapy   Manual Therapy Manual Lymphatic Drainage (MLD);Edema management    Edema Management Discussed with pt the importance of compressing to the level of swelling in order to facilitate fluid flow out of the area and decrease re-occurance with removal of compression for short peroids of time. Discussed various compression techniques including socks with capris, leggings and use of edema wear at night possibly. She was educated on how to easily don compression socks.     Manual Lymphatic Drainage (MLD) in supine HOB elevated and due to back pain; short neck, swimming in the terminus, bil shoulder collectors, bil axillary nodes, bil inguino-axillary anastamosis, one leg at at time lateral thigh, medial to lateral thigh, lateral thigh, bottle neck of the knee, posterior knee, all surfaces of the lower leg, bil ankle,  dorsum of the foot, re-worked all surfaces from foot to axilla 3x. Superficial abdominals at end of session                  PT Education - 02/28/20 1230    Education Details Pt educated on how to easily don compression socks and educated on the importance of compressing up to the level of swelling for best results.    Person(s) Educated Patient    Methods Explanation;Demonstration    Comprehension Verbalized understanding               PT Long Term Goals - 02/19/20 1710      PT LONG TERM GOAL #1   Title Pt will be ind with self care for bilateral LE lymphedema to include use of compression garments and MLD and/or compression pump    Time 6    Period Weeks    Status New      PT LONG TERM GOAL #2   Title Pt will report decreased edema in the ankle after working by at least 50% to demonstrate improved lymphatic flow    Time 6    Period Weeks    Status New                 Plan - 02/28/20 1100    Clinical Impression Statement Pt presents with knee high compression socks on once removed demonstrated increased indentations below the knee at the right ankle indicating she has edema over the knee. Pt was educated on the anatomy and physiology of the lymphatic system and why this is important when choosing level of compression in order to move fluid out of the lower legs effectively. MLD was performed for the bil LE with softening of the tissue throughout. Pt demonstrates soft non-fibrotic edema meaning she may benefit from a lower compressive amount if she is able to compress to the appropriate level of the body. Pt will benefit from continued POC at this time.    Personal Factors and Comorbidities Past/Current Experience;Comorbidity 2    Comorbidities radiation history, active cancer    Examination-Activity Limitations Stand    Examination-Participation Restrictions Community Activity    Rehab Potential Excellent    PT Frequency 2x / week    PT Duration 6 weeks    PT  Treatment/Interventions ADLs/Self Care Home Management;Therapeutic exercise;Patient/family education;Manual lymph drainage;Manual techniques;Passive range of motion    PT Next Visit Plan how was treatment tolerated? bil leg MLD using axillary nodes, keep monitoring compression needs but pt seems happy with current, pump?    PT Home Exercise Plan self MLD  Consulted and Agree with Plan of Care Patient           Patient will benefit from skilled therapeutic intervention in order to improve the following deficits and impairments:  Decreased skin integrity, Increased edema  Visit Diagnosis: Lymphedema  Disorder of the skin and subcutaneous tissue related to radiation, unspecified  Muscle weakness (generalized)  Cramp and spasm  Unspecified lack of coordination  Aftercare following surgery for neoplasm     Problem List Patient Active Problem List   Diagnosis Date Noted  . 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 Genetic testing 01/27/2020  . Family history of breast cancer   . Family history of lung cancer   . Family history of ovarian cancer   . Family history of non-Hodgkin's lymphoma   . 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 Port catheter in place 11/21/2016  . 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 Anal cancer ([place]) 11/03/2016  . Cystitis 05/06/2015  . Degenerative cervical disc 04/08/2015  . Trigger point of neck 03/26/2015  . Pain in joint, ankle and foot 04/23/2013  . Visit for preventive health examination 02/13/2013  . Routine gynecological examination 02/13/2013  . Family history of breast cancer in first degree relative 02/13/2013  . Rash and nonspecific skin eruption 02/13/2013  . 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 VITAMIN D DEFICIENCY 06/25/2008  . NECK PAIN 06/02/2008  . FOOT PAIN 06/02/2008  . OSTEOPENIA 06/02/2008    Ander Purpura, PT 02/28/2020, 12:34 PM  [place], [place], [address] Phone: [phone]   Fax:  [phone]  Name: Ashley Moore MRN: [id number] Date of Birth: 08/13/1951

## 2020-03-02 ENCOUNTER — Inpatient Hospital Stay: Payer: Medicare HMO

## 2020-03-02 ENCOUNTER — Inpatient Hospital Stay (HOSPITAL_BASED_OUTPATIENT_CLINIC_OR_DEPARTMENT_OTHER): Payer: Medicare HMO | Admitting: Oncology

## 2020-03-02 ENCOUNTER — Ambulatory Visit
Admission: RE | Admit: 2020-03-02 | Discharge: 2020-03-02 | Disposition: A | Discharge status: Home / Self Care | Payer: Medicare HMO | Source: Ambulatory Visit | Attending: Radiation Oncology | Admitting: Radiation Oncology

## 2020-03-02 ENCOUNTER — Ambulatory Visit: Payer: Medicare HMO | Admitting: Rehabilitation

## 2020-03-02 ENCOUNTER — Telehealth: Payer: Self-pay | Admitting: Nurse Practitioner

## 2020-03-02 ENCOUNTER — Other Ambulatory Visit (HOSPITAL_COMMUNITY): Payer: Medicare HMO

## 2020-03-02 ENCOUNTER — Encounter: Payer: Self-pay | Admitting: Rehabilitation

## 2020-03-02 ENCOUNTER — Other Ambulatory Visit: Payer: Medicare HMO

## 2020-03-02 ENCOUNTER — Other Ambulatory Visit: Payer: Self-pay

## 2020-03-02 VITALS — BP 148/80 | HR 95 | Temp 97.8°F | Resp 18 | Ht 62.0 in | Wt 178.2 lb

## 2020-03-02 DIAGNOSIS — R110 Nausea: Secondary | ICD-10-CM | POA: Diagnosis not present

## 2020-03-02 DIAGNOSIS — F329 Major depressive disorder, single episode, unspecified: Secondary | ICD-10-CM | POA: Diagnosis not present

## 2020-03-02 DIAGNOSIS — C210 Malignant neoplasm of anus, unspecified: Secondary | ICD-10-CM

## 2020-03-02 DIAGNOSIS — C772 Secondary and unspecified malignant neoplasm of intra-abdominal lymph nodes: Secondary | ICD-10-CM | POA: Diagnosis not present

## 2020-03-02 DIAGNOSIS — Z483 Aftercare following surgery for neoplasm: Secondary | ICD-10-CM

## 2020-03-02 DIAGNOSIS — C778 Secondary and unspecified malignant neoplasm of lymph nodes of multiple regions: Secondary | ICD-10-CM | POA: Diagnosis not present

## 2020-03-02 DIAGNOSIS — R252 Cramp and spasm: Secondary | ICD-10-CM | POA: Diagnosis not present

## 2020-03-02 DIAGNOSIS — I890 Lymphedema, not elsewhere classified: Secondary | ICD-10-CM

## 2020-03-02 DIAGNOSIS — M6281 Muscle weakness (generalized): Secondary | ICD-10-CM | POA: Diagnosis not present

## 2020-03-02 DIAGNOSIS — N952 Postmenopausal atrophic vaginitis: Secondary | ICD-10-CM | POA: Diagnosis not present

## 2020-03-02 DIAGNOSIS — Z510 Encounter for antineoplastic radiation therapy: Secondary | ICD-10-CM | POA: Diagnosis not present

## 2020-03-02 DIAGNOSIS — L599 Disorder of the skin and subcutaneous tissue related to radiation, unspecified: Secondary | ICD-10-CM

## 2020-03-02 DIAGNOSIS — R309 Painful micturition, unspecified: Secondary | ICD-10-CM | POA: Diagnosis not present

## 2020-03-02 DIAGNOSIS — Z923 Personal history of irradiation: Secondary | ICD-10-CM | POA: Diagnosis not present

## 2020-03-02 DIAGNOSIS — R279 Unspecified lack of coordination: Secondary | ICD-10-CM | POA: Diagnosis not present

## 2020-03-02 LAB — CBC WITH DIFFERENTIAL (CANCER CENTER ONLY)
Abs Immature Granulocytes: 0.01 10*3/uL (ref 0.00–0.07)
Basophils Absolute: 0 10*3/uL (ref 0.0–0.1)
Basophils Relative: 1 %
Eosinophils Absolute: 0.2 10*3/uL (ref 0.0–0.5)
Eosinophils Relative: 5 %
HCT: 41.1 % (ref 36.0–46.0)
Hemoglobin: 13.1 g/dL (ref 12.0–15.0)
Immature Granulocytes: 0 %
Lymphocytes Relative: 20 %
Lymphs Abs: 1 10*3/uL (ref 0.7–4.0)
MCH: 28 pg (ref 26.0–34.0)
MCHC: 31.9 g/dL (ref 30.0–36.0)
MCV: 87.8 fL (ref 80.0–100.0)
Monocytes Absolute: 0.4 10*3/uL (ref 0.1–1.0)
Monocytes Relative: 9 %
Neutro Abs: 3.4 10*3/uL (ref 1.7–7.7)
Neutrophils Relative %: 65 %
Platelet Count: 213 10*3/uL (ref 150–400)
RBC: 4.68 MIL/uL (ref 3.87–5.11)
RDW: 14.3 % (ref 11.5–15.5)
WBC Count: 5.2 10*3/uL (ref 4.0–10.5)
nRBC: 0 % (ref 0.0–0.2)

## 2020-03-02 LAB — CMP (CANCER CENTER ONLY)
ALT: 15 U/L (ref 0–44)
AST: 29 U/L (ref 15–41)
Albumin: 3.7 g/dL (ref 3.5–5.0)
Alkaline Phosphatase: 73 U/L (ref 38–126)
Anion gap: 9 (ref 5–15)
BUN: 15 mg/dL (ref 8–23)
CO2: 26 mmol/L (ref 22–32)
Calcium: 9.4 mg/dL (ref 8.9–10.3)
Chloride: 105 mmol/L (ref 98–111)
Creatinine: 0.81 mg/dL (ref 0.44–1.00)
GFR, Est AFR Am: >60 mL/min (ref >60)
GFR, Estimated: >60 mL/min (ref >60)
Glucose, Bld: 89 mg/dL (ref 70–99)
Potassium: 4 mmol/L (ref 3.5–5.1)
Sodium: 140 mmol/L (ref 135–145)
Total Bilirubin: 0.4 mg/dL (ref 0.3–1.2)
Total Protein: 7.4 g/dL (ref 6.5–8.1)

## 2020-03-02 MED FILL — CAPECITABINE 500 MG TABLET: 500 | 28 days supply | Qty: 128 | Fill #0

## 2020-03-02 NOTE — Therapy (Signed)
[place], [place], [address] Phone: [phone]   Fax:  [phone]  Physical Therapy Treatment  Patient Details  Name: Ashley Moore MRN: [id number] Date of Birth: 04/08/1951 Referring Provider (PT): Shona Simpson PA-C   Encounter Date: 03/02/2020   PT End of Session - 03/02/20 1455    Visit Number 4    Number of Visits 12    Date for PT Re-Evaluation 04/01/20    Authorization - Visit Number 4    Authorization - Number of Visits 12    PT Start Time 4917    PT Stop Time 1456    PT Time Calculation (min) 41 min    Activity Tolerance Patient tolerated treatment well    Behavior During Therapy [facility] for tasks assessed/performed           Past Medical History:  Diagnosis Date  . Bowen's disease    excised 1992  . Family history of breast cancer   . Family history of lung cancer   . Family history of non-Hodgkin's lymphoma   . Family history of ovarian cancer   . Headache   . Hx of varicella   . 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([place])   . Thyroid cancer [facility])     Past Surgical History:  Procedure Laterality Date  . BUNIONECTOMY Right   . CYSTOSCOPY WITH RETROGRADE PYELOGRAM, URETEROSCOPY AND STENT PLACEMENT Right 01/28/2020   Procedure: CYSTOSCOPY WITH RETROGRADE PYELOGRAM, URETEROSCOPY AND STENT PLACEMENT;  Surgeon: Alexis Frock, MD;  [facility];  Service: Urology;  Laterality: Right;  . IR GENERIC HISTORICAL  11/14/2016   IR US GUIDE VASC ACCESS RIGHT 11/14/2016 [facility]  . IR GENERIC HISTORICAL  11/14/2016   IR FLUORO GUIDE CV LINE RIGHT 11/14/2016 [facility]  . IR GENERIC HISTORICAL  12/12/2016   IR US GUIDE VASC ACCESS RIGHT 12/12/2016 [name], MD [facility]  . IR GENERIC HISTORICAL  12/12/2016   IR FLUORO GUIDE CV LINE RIGHT 12/12/2016 [name], MD [facility]  . SKIN CANCER EXCISION     bowens disease    There were no vitals filed for this visit.   Subjective Assessment -  03/02/20 1414    Subjective I had my first radiation today and my back feels better.  I stopped taking some pain medication because I feel like I am over medicated.    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 Pt initially diagnosed in 2018 with history of Chemotherapy and radiation.  Rt nephroureteral stent in 2021 due to hydronephrosis now decompressed.  Radiation will be performed again most likely with concurrent chemotherapy    Currently in Pain? Yes                             [facility] Adult PT Treatment/Exercise - 03/02/20 0001      Manual Therapy   Manual Lymphatic Drainage (MLD) in supine HOB elevated bil axillary nodes, bil inguino-axillary anastamosis, one leg at at time lateral thigh, medial to lateral thigh, lateral thigh, bottle neck of the knee, posterior knee, all surfaces of the lower leg, bil ankle, dorsum of the foot, re-worked all surfaces from foot to axilla 3x.                       PT Long Term Goals - 02/19/20 1710      PT LONG TERM GOAL #  1   Title Pt will be ind with self care for bilateral LE lymphedema to include use of compression garments and MLD and/or compression pump    Time 6    Period Weeks    Status New      PT LONG TERM GOAL #2   Title Pt will report decreased edema in the ankle after working by at least 50% to demonstrate improved lymphatic flow    Time 6    Period Weeks    Status New                 Plan - 03/02/20 1456    Clinical Impression Statement Pt with less overall lower extremity pitting edema and soft edema bilaterally from evaluation  Pt started radiation today and starts chemotherapy pill tomorrow.  As pt is doing so well we will save some visits for the end of radiation and pt will return again in 2 weeks.    PT Frequency 2x / week    PT Duration 6 weeks    PT Treatment/Interventions ADLs/Self Care Home Management;Therapeutic exercise;Patient/family  education;Manual lymph drainage;Manual techniques;Passive range of motion    PT Next Visit Plan how has swelling been with radiation x 2 weeks and chemo pill? bil leg MLD using axillary nodes, keep monitoring compression needs but pt seems happy with current, pump?    PT Home Exercise Plan self MLD    Consulted and Agree with Plan of Care Patient           Patient will benefit from skilled therapeutic intervention in order to improve the following deficits and impairments:     Visit Diagnosis: Lymphedema  Disorder of the skin and subcutaneous tissue related to radiation, unspecified  Aftercare following surgery for neoplasm     Problem List Patient Active Problem List   Diagnosis Date Noted  . 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 Genetic testing 01/27/2020  . Family history of breast cancer   . Family history of lung cancer   . Family history of ovarian cancer   . Family history of non-Hodgkin's lymphoma   . 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 Port catheter in place 11/21/2016  . 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 Anal cancer ([place]) 11/03/2016  . Cystitis 05/06/2015  . Degenerative cervical disc 04/08/2015  . Trigger point of neck 03/26/2015  . Pain in joint, ankle and foot 04/23/2013  . Visit for preventive health examination 02/13/2013  . Routine gynecological examination 02/13/2013  . Family history of breast cancer in first degree relative 02/13/2013  . Rash and nonspecific skin eruption 02/13/2013  . 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 VITAMIN D DEFICIENCY 06/25/2008  . NECK PAIN 06/02/2008  . FOOT PAIN 06/02/2008  . OSTEOPENIA 06/02/2008    Stark Bray 03/02/2020, 3:59 PM  [place], [place], [address] Phone: [phone]   Fax:  [phone]  Name: Ashley Moore MRN: [id number] Date of Birth: 06-Aug-1951

## 2020-03-02 NOTE — Telephone Encounter (Signed)
Scheduled per 6/14 los. No AVS or calendar needed to be printed. Pt is mychart active.

## 2020-03-02 NOTE — Telephone Encounter (Signed)
Oral Chemotherapy Pharmacist Encounter  [name] picked up her Xeloda (capecitabine) from [place] today 03/02/20. She knows she can get started today.  Patient Education I spoke with patient for overview of new oral chemotherapy medication: Xeloda (capecitabine) for the treatment of recurrent anal cancer in conjunction with RT, planned duration until the end of RT. Planned start 03/02/20.   Counseled patient on administration, dosing, side effects, monitoring, drug-food interactions, safe handling, storage, and disposal. Patient will take 3 tablets (1,500 mg total) by mouth 2 (two) times daily after a meal. Take Monday- Friday, only on days of radiation.  Side effects include but not limited to: diarrhea, hand-foot syndrome, edema, decreased wbc, fatigue, N/V.    Reviewed with patient importance of keeping a medication schedule and plan for any missed doses.  [name] voiced understanding and appreciation. All questions answered. Medication handout placed in the mail.  Provided patient with [facility] phone number. Patient knows to call the office with questions or concerns. Oral Chemotherapy Navigation Clinic will continue to follow.  Ashley Moore, PharmD, BCPS, BCOP, CPP Hematology/Oncology Clinical Pharmacist Practitioner [facility]/AP [facility] [phone]  03/02/2020 4:03 PM

## 2020-03-02 NOTE — Progress Notes (Signed)
Ashley Moore OFFICE PROGRESS NOTE   Diagnosis: Anal cancer  INTERVAL HISTORY:   Ashley Moore returns for a scheduled visit.  She started radiation today.  She is here today with her sister for further discussion.  She reports intermittent hematuria.  Objective:  Vital signs in last 24 hours:  Blood pressure (!) 148/80, pulse 95, temperature 97.8 F (36.6 C), temperature source Temporal, resp. rate 18, height 5\' 2"  (1.575 m), weight 178 lb 3.2 oz (80.8 kg), SpO2 100 %.   Physical examination-not performed today  Lab Results:  Lab Results  Component Value Date   WBC 5.2 03/02/2020   HGB 13.1 03/02/2020   HCT 41.1 03/02/2020   MCV 87.8 03/02/2020   PLT 213 03/02/2020   NEUTROABS 3.4 03/02/2020    CMP  Lab Results  Component Value Date   NA 140 03/02/2020   K 4.0 03/02/2020   CL 105 03/02/2020   CO2 26 03/02/2020   GLUCOSE 89 03/02/2020   BUN 15 03/02/2020   CREATININE 0.81 03/02/2020   CALCIUM 9.4 03/02/2020   PROT 7.4 03/02/2020   ALBUMIN 3.7 03/02/2020   AST 29 03/02/2020   ALT 15 03/02/2020   ALKPHOS 73 03/02/2020   BILITOT 0.4 03/02/2020   GFRNONAA >60 03/02/2020   GFRAA >60 03/02/2020     Medications: I have reviewed the patient's current medications.   Assessment/Plan:  1.  Anal cancer ? CT abdomen/pelvis 10/21/2016-thickening of the anus extending to the junction between the anus and rectum with a large stool ball in the rectum and mild fat stranding posterior to the rectum. Enlarged lymph nodes in the right and left inguinal regions. A few mildly prominent nodesseen posterior to the rectum. ? 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 ? PET scan 16/06/9603-[id number] hypermetabolic anal mass with hypermetabolic metastases to the groin region bilaterally, left pelvic sidewall and presacral space. ? Initiation of radiation and cycle 1 5-FU/mitomycin C 11/14/2016 ? Cycle 2 5-FU/mitomycin C 12/12/2016 (5-FU dose  reduced due to mucositis, diarrhea, skin breakdown) ? Radiation completed 12/23/2016 ? CT abdomen/pelvis 04/14/2017-resolution of anal mass and bilateral inguinal lymphadenopathy. No residual tumor seen. ? CT abdomen/pelvis 01/28/2020-right hydroureteronephrosis, transition at the level of the mid right ureter, retroperitoneal lymphadenopathy ? CT renal stone study 01/28/2020-severe right hydronephrosis and proximal right hydroureter, 8 mm area of increased attenuation in the proximal right ureter-stone versus soft tissue mass ? PET 02/13/2020-hypermetabolic right retroperitoneal nodal metastases in the aortocaval and posterior pericaval chains, nonspecific mild anal wall hypermetabolism, right nephroureteral stent ? 02/21/2020 anorectal exam per Dr. Ashley Jacobs radiation changes of the skin around the anal margin.  Posterior midline smooth scarring.  Mildly stenotic.  Stenosis seems better.  No palpable concerning lesions.  Entire anal canal and distal rectum feels smooth and healthy.  Partial anoscopy performed with no lesions in the anal canal. ? Xeloda/radiation beginning 03/02/2020 2. Left labial lesions. Question direct extension from the anal cancer versus metastatic disease from anal cancer versus a separate malignant process. 3. History of pain and bleeding secondary to #1 and skin breakdown 4. 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 5. Multiple family members with breast cancer. 6. History of hypokalemia-likely secondary to decreased nutritional intake and diarrhea; potassium in normal range 01/16/2017. No longer taking a potassium supplement. 7. Right hydroureteronephrosis on CT 01/28/2020, status post a cystoscopy/pyelogram 01/28/2020 confirming extrinsic compression of the right ureter, status post stent placement    Disposition: Ashley Moore has been  diagnosed with recurrent anal cancer.  I discussed the prognosis and treatment options with  Ashley Moore again today.  Her sister was present for today's visit.  She understands there is a high chance of clinical improvement with capecitabine/radiation.  There is a chance the treatment will be curative, but this is less likely.  We will plan on a restaging CT evaluation 1 to 2 months after completing treatment.  She may be able to have the ureter stent removed if she experiences a complete response.   Ashley Moore will begin capecitabine today.  She will return for an office visit in approximately 2 weeks.  Betsy Coder, MD  03/02/2020  1:54 PM

## 2020-03-03 ENCOUNTER — Other Ambulatory Visit: Payer: Self-pay

## 2020-03-03 ENCOUNTER — Ambulatory Visit
Admission: RE | Admit: 2020-03-03 | Discharge: 2020-03-03 | Disposition: A | Discharge status: Home / Self Care | Payer: Medicare HMO | Source: Ambulatory Visit | Attending: Radiation Oncology | Admitting: Radiation Oncology

## 2020-03-03 DIAGNOSIS — C772 Secondary and unspecified malignant neoplasm of intra-abdominal lymph nodes: Secondary | ICD-10-CM | POA: Diagnosis not present

## 2020-03-03 DIAGNOSIS — C778 Secondary and unspecified malignant neoplasm of lymph nodes of multiple regions: Secondary | ICD-10-CM | POA: Diagnosis not present

## 2020-03-03 DIAGNOSIS — Z510 Encounter for antineoplastic radiation therapy: Secondary | ICD-10-CM | POA: Diagnosis not present

## 2020-03-03 DIAGNOSIS — N952 Postmenopausal atrophic vaginitis: Secondary | ICD-10-CM | POA: Diagnosis not present

## 2020-03-03 DIAGNOSIS — R110 Nausea: Secondary | ICD-10-CM | POA: Diagnosis not present

## 2020-03-03 DIAGNOSIS — F329 Major depressive disorder, single episode, unspecified: Secondary | ICD-10-CM | POA: Diagnosis not present

## 2020-03-03 DIAGNOSIS — I890 Lymphedema, not elsewhere classified: Secondary | ICD-10-CM | POA: Diagnosis not present

## 2020-03-03 DIAGNOSIS — C210 Malignant neoplasm of anus, unspecified: Secondary | ICD-10-CM | POA: Diagnosis not present

## 2020-03-04 ENCOUNTER — Other Ambulatory Visit: Payer: Self-pay

## 2020-03-04 ENCOUNTER — Ambulatory Visit
Admission: RE | Admit: 2020-03-04 | Discharge: 2020-03-04 | Disposition: A | Discharge status: Home / Self Care | Payer: Medicare HMO | Source: Ambulatory Visit | Attending: Radiation Oncology | Admitting: Radiation Oncology

## 2020-03-04 DIAGNOSIS — C210 Malignant neoplasm of anus, unspecified: Secondary | ICD-10-CM | POA: Diagnosis not present

## 2020-03-04 DIAGNOSIS — R350 Frequency of micturition: Secondary | ICD-10-CM | POA: Diagnosis not present

## 2020-03-04 DIAGNOSIS — N952 Postmenopausal atrophic vaginitis: Secondary | ICD-10-CM | POA: Diagnosis not present

## 2020-03-04 DIAGNOSIS — I890 Lymphedema, not elsewhere classified: Secondary | ICD-10-CM | POA: Diagnosis not present

## 2020-03-04 DIAGNOSIS — R3915 Urgency of urination: Secondary | ICD-10-CM | POA: Diagnosis not present

## 2020-03-04 DIAGNOSIS — N3281 Overactive bladder: Secondary | ICD-10-CM | POA: Diagnosis not present

## 2020-03-04 DIAGNOSIS — Q525 Fusion of labia: Secondary | ICD-10-CM | POA: Diagnosis not present

## 2020-03-04 DIAGNOSIS — C778 Secondary and unspecified malignant neoplasm of lymph nodes of multiple regions: Secondary | ICD-10-CM | POA: Diagnosis not present

## 2020-03-04 DIAGNOSIS — F329 Major depressive disorder, single episode, unspecified: Secondary | ICD-10-CM | POA: Diagnosis not present

## 2020-03-04 DIAGNOSIS — R110 Nausea: Secondary | ICD-10-CM | POA: Diagnosis not present

## 2020-03-04 DIAGNOSIS — Z510 Encounter for antineoplastic radiation therapy: Secondary | ICD-10-CM | POA: Diagnosis not present

## 2020-03-04 DIAGNOSIS — C772 Secondary and unspecified malignant neoplasm of intra-abdominal lymph nodes: Secondary | ICD-10-CM | POA: Diagnosis not present

## 2020-03-05 ENCOUNTER — Ambulatory Visit
Admission: RE | Admit: 2020-03-05 | Discharge: 2020-03-05 | Disposition: A | Discharge status: Home / Self Care | Payer: Medicare HMO | Source: Ambulatory Visit | Attending: Radiation Oncology | Admitting: Radiation Oncology

## 2020-03-05 ENCOUNTER — Other Ambulatory Visit: Payer: Self-pay

## 2020-03-05 DIAGNOSIS — C210 Malignant neoplasm of anus, unspecified: Secondary | ICD-10-CM | POA: Diagnosis not present

## 2020-03-05 DIAGNOSIS — N952 Postmenopausal atrophic vaginitis: Secondary | ICD-10-CM | POA: Diagnosis not present

## 2020-03-05 DIAGNOSIS — F329 Major depressive disorder, single episode, unspecified: Secondary | ICD-10-CM | POA: Diagnosis not present

## 2020-03-05 DIAGNOSIS — C778 Secondary and unspecified malignant neoplasm of lymph nodes of multiple regions: Secondary | ICD-10-CM | POA: Diagnosis not present

## 2020-03-05 DIAGNOSIS — C772 Secondary and unspecified malignant neoplasm of intra-abdominal lymph nodes: Secondary | ICD-10-CM | POA: Diagnosis not present

## 2020-03-05 DIAGNOSIS — R110 Nausea: Secondary | ICD-10-CM | POA: Diagnosis not present

## 2020-03-05 DIAGNOSIS — Z510 Encounter for antineoplastic radiation therapy: Secondary | ICD-10-CM | POA: Diagnosis not present

## 2020-03-05 DIAGNOSIS — I890 Lymphedema, not elsewhere classified: Secondary | ICD-10-CM | POA: Diagnosis not present

## 2020-03-05 NOTE — Progress Notes (Signed)
Pt here for patient teaching.  Pt given Radiation and You booklet and skin care instructions.  Reviewed areas of pertinence such as diarrhea, fatigue, hair loss, nausea and vomiting, skin changes and urinary and bladder changes . Pt able to give teach back of to pat skin, use unscented/gentle soap, use baby wipes, have Imodium on hand, drink plenty of water and sitz bath,avoid applying anything to skin within 4 hours of treatment. Pt verbalizes understanding of information given and will contact nursing with any questions or concerns.     Gloriajean Dell. Leonie Green, BSN

## 2020-03-06 ENCOUNTER — Encounter: Payer: Medicare HMO | Admitting: Rehabilitation

## 2020-03-06 ENCOUNTER — Ambulatory Visit
Admission: RE | Admit: 2020-03-06 | Discharge: 2020-03-06 | Disposition: A | Discharge status: Home / Self Care | Payer: Medicare HMO | Source: Ambulatory Visit | Attending: Radiation Oncology | Admitting: Radiation Oncology

## 2020-03-06 ENCOUNTER — Other Ambulatory Visit: Payer: Self-pay

## 2020-03-06 DIAGNOSIS — R110 Nausea: Secondary | ICD-10-CM | POA: Diagnosis not present

## 2020-03-06 DIAGNOSIS — N952 Postmenopausal atrophic vaginitis: Secondary | ICD-10-CM | POA: Diagnosis not present

## 2020-03-06 DIAGNOSIS — I890 Lymphedema, not elsewhere classified: Secondary | ICD-10-CM | POA: Diagnosis not present

## 2020-03-06 DIAGNOSIS — C210 Malignant neoplasm of anus, unspecified: Secondary | ICD-10-CM | POA: Diagnosis not present

## 2020-03-06 DIAGNOSIS — C778 Secondary and unspecified malignant neoplasm of lymph nodes of multiple regions: Secondary | ICD-10-CM | POA: Diagnosis not present

## 2020-03-06 DIAGNOSIS — C772 Secondary and unspecified malignant neoplasm of intra-abdominal lymph nodes: Secondary | ICD-10-CM | POA: Diagnosis not present

## 2020-03-06 DIAGNOSIS — Z510 Encounter for antineoplastic radiation therapy: Secondary | ICD-10-CM | POA: Diagnosis not present

## 2020-03-06 DIAGNOSIS — F329 Major depressive disorder, single episode, unspecified: Secondary | ICD-10-CM | POA: Diagnosis not present

## 2020-03-06 MED ORDER — SONAFINE EX EMUL
1.0000 "application " | Freq: Once | CUTANEOUS | Status: AC
  Administered 2020-03-06: 1 via TOPICAL

## 2020-03-09 ENCOUNTER — Ambulatory Visit
Admission: RE | Admit: 2020-03-09 | Discharge: 2020-03-09 | Disposition: A | Discharge status: Home / Self Care | Payer: Medicare HMO | Source: Ambulatory Visit | Attending: Radiation Oncology | Admitting: Radiation Oncology

## 2020-03-09 ENCOUNTER — Other Ambulatory Visit: Payer: Self-pay

## 2020-03-09 DIAGNOSIS — I890 Lymphedema, not elsewhere classified: Secondary | ICD-10-CM | POA: Diagnosis not present

## 2020-03-09 DIAGNOSIS — R110 Nausea: Secondary | ICD-10-CM | POA: Diagnosis not present

## 2020-03-09 DIAGNOSIS — N952 Postmenopausal atrophic vaginitis: Secondary | ICD-10-CM | POA: Diagnosis not present

## 2020-03-09 DIAGNOSIS — C778 Secondary and unspecified malignant neoplasm of lymph nodes of multiple regions: Secondary | ICD-10-CM | POA: Diagnosis not present

## 2020-03-09 DIAGNOSIS — C772 Secondary and unspecified malignant neoplasm of intra-abdominal lymph nodes: Secondary | ICD-10-CM | POA: Diagnosis not present

## 2020-03-09 DIAGNOSIS — C210 Malignant neoplasm of anus, unspecified: Secondary | ICD-10-CM | POA: Diagnosis not present

## 2020-03-09 DIAGNOSIS — F329 Major depressive disorder, single episode, unspecified: Secondary | ICD-10-CM | POA: Diagnosis not present

## 2020-03-09 DIAGNOSIS — Z510 Encounter for antineoplastic radiation therapy: Secondary | ICD-10-CM | POA: Diagnosis not present

## 2020-03-10 ENCOUNTER — Other Ambulatory Visit: Payer: Self-pay

## 2020-03-10 ENCOUNTER — Ambulatory Visit
Admission: RE | Admit: 2020-03-10 | Discharge: 2020-03-10 | Disposition: A | Discharge status: Home / Self Care | Payer: Medicare HMO | Source: Ambulatory Visit | Attending: Radiation Oncology | Admitting: Radiation Oncology

## 2020-03-10 DIAGNOSIS — C210 Malignant neoplasm of anus, unspecified: Secondary | ICD-10-CM | POA: Diagnosis not present

## 2020-03-10 DIAGNOSIS — R110 Nausea: Secondary | ICD-10-CM | POA: Diagnosis not present

## 2020-03-10 DIAGNOSIS — C772 Secondary and unspecified malignant neoplasm of intra-abdominal lymph nodes: Secondary | ICD-10-CM | POA: Diagnosis not present

## 2020-03-10 DIAGNOSIS — N952 Postmenopausal atrophic vaginitis: Secondary | ICD-10-CM | POA: Diagnosis not present

## 2020-03-10 DIAGNOSIS — Z510 Encounter for antineoplastic radiation therapy: Secondary | ICD-10-CM | POA: Diagnosis not present

## 2020-03-10 DIAGNOSIS — F329 Major depressive disorder, single episode, unspecified: Secondary | ICD-10-CM | POA: Diagnosis not present

## 2020-03-10 DIAGNOSIS — C778 Secondary and unspecified malignant neoplasm of lymph nodes of multiple regions: Secondary | ICD-10-CM | POA: Diagnosis not present

## 2020-03-10 DIAGNOSIS — I890 Lymphedema, not elsewhere classified: Secondary | ICD-10-CM | POA: Diagnosis not present

## 2020-03-11 ENCOUNTER — Ambulatory Visit
Admission: RE | Admit: 2020-03-11 | Discharge: 2020-03-11 | Disposition: A | Discharge status: Home / Self Care | Payer: Medicare HMO | Source: Ambulatory Visit | Attending: Radiation Oncology | Admitting: Radiation Oncology

## 2020-03-11 ENCOUNTER — Ambulatory Visit: Payer: Medicare HMO | Admitting: Rehabilitation

## 2020-03-11 ENCOUNTER — Encounter: Payer: Medicare HMO | Admitting: Rehabilitation

## 2020-03-11 ENCOUNTER — Encounter: Payer: Self-pay | Admitting: Rehabilitation

## 2020-03-11 ENCOUNTER — Ambulatory Visit: Payer: Medicare HMO

## 2020-03-11 DIAGNOSIS — R279 Unspecified lack of coordination: Secondary | ICD-10-CM | POA: Diagnosis not present

## 2020-03-11 DIAGNOSIS — Z483 Aftercare following surgery for neoplasm: Secondary | ICD-10-CM | POA: Diagnosis not present

## 2020-03-11 DIAGNOSIS — M6281 Muscle weakness (generalized): Secondary | ICD-10-CM | POA: Diagnosis not present

## 2020-03-11 DIAGNOSIS — C772 Secondary and unspecified malignant neoplasm of intra-abdominal lymph nodes: Secondary | ICD-10-CM | POA: Diagnosis not present

## 2020-03-11 DIAGNOSIS — R252 Cramp and spasm: Secondary | ICD-10-CM

## 2020-03-11 DIAGNOSIS — L599 Disorder of the skin and subcutaneous tissue related to radiation, unspecified: Secondary | ICD-10-CM | POA: Diagnosis not present

## 2020-03-11 DIAGNOSIS — F329 Major depressive disorder, single episode, unspecified: Secondary | ICD-10-CM | POA: Diagnosis not present

## 2020-03-11 DIAGNOSIS — N952 Postmenopausal atrophic vaginitis: Secondary | ICD-10-CM | POA: Diagnosis not present

## 2020-03-11 DIAGNOSIS — C778 Secondary and unspecified malignant neoplasm of lymph nodes of multiple regions: Secondary | ICD-10-CM | POA: Diagnosis not present

## 2020-03-11 DIAGNOSIS — I890 Lymphedema, not elsewhere classified: Secondary | ICD-10-CM

## 2020-03-11 DIAGNOSIS — Z510 Encounter for antineoplastic radiation therapy: Secondary | ICD-10-CM | POA: Diagnosis not present

## 2020-03-11 DIAGNOSIS — C210 Malignant neoplasm of anus, unspecified: Secondary | ICD-10-CM | POA: Diagnosis not present

## 2020-03-11 DIAGNOSIS — R110 Nausea: Secondary | ICD-10-CM | POA: Diagnosis not present

## 2020-03-11 NOTE — Therapy (Signed)
[place], [place], [address] Phone: [phone]   Fax:  [phone]  Physical Therapy Treatment  Patient Details  Name: Ashley Moore MRN: [id number] Date of Birth: 1951-05-09 Referring Provider (PT): Shona Simpson PA-C   Encounter Date: 03/11/2020   PT End of Session - 03/11/20 1655    Visit Number 5    Number of Visits 12    Date for PT Re-Evaluation 04/01/20    Authorization - Visit Number 5    Authorization - Number of Visits 12    PT Start Time 1600    PT Stop Time 9233    PT Time Calculation (min) 54 min    Activity Tolerance Patient tolerated treatment well    Behavior During Therapy [facility] for tasks assessed/performed           Past Medical History:  Diagnosis Date  . Bowen's disease    excised 1992  . Family history of breast cancer   . Family history of lung cancer   . Family history of non-Hodgkin's lymphoma   . Family history of ovarian cancer   . Headache   . Hx of varicella   . 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([place])   . Thyroid cancer [facility]     Past Surgical History:  Procedure Laterality Date  . BUNIONECTOMY Right   . CYSTOSCOPY WITH RETROGRADE PYELOGRAM, URETEROSCOPY AND STENT PLACEMENT Right 01/28/2020   Procedure: CYSTOSCOPY WITH RETROGRADE PYELOGRAM, URETEROSCOPY AND STENT PLACEMENT;  Surgeon: Alexis Frock, MD;  Location: [facility];  Service: Urology;  Laterality: Right;  . IR GENERIC HISTORICAL  11/14/2016   IR US GUIDE VASC ACCESS RIGHT 11/14/2016 [facility]  . IR GENERIC HISTORICAL  11/14/2016   IR FLUORO GUIDE CV LINE RIGHT 11/14/2016 [facility]  . IR GENERIC HISTORICAL  12/12/2016   IR US GUIDE VASC ACCESS RIGHT 12/12/2016 [name], MD [facility]  . IR GENERIC HISTORICAL  12/12/2016   IR FLUORO GUIDE CV LINE RIGHT 12/12/2016 [name], MD [facility]  . SKIN CANCER EXCISION     bowens disease    There were no vitals filed for this visit.   Subjective Assessment -  03/11/20 1605    Subjective My legs are no worse so that it good. I got 2 pairs of tommy copper leggings that I like using them with my stockings.  I also ordered 2 tops.    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 Pt initially diagnosed in 2018 with history of Chemotherapy and radiation.  Rt nephroureteral stent in 2021 due to hydronephrosis now decompressed.  Radiation will be performed again most likely with concurrent chemotherapy    Patient Stated Goals I'm not sure but I am going to be doing radiation again    Currently in Pain? No/denies                 LYMPHEDEMA/ONCOLOGY QUESTIONNAIRE - 03/11/20 0001      Right Lower Extremity Lymphedema   10 cm Proximal to Suprapatella 53.2 cm    At Midpatella/Popliteal Crease 47.5 cm    30 cm Proximal to Floor at Lateral Plantar Foot 41.4 cm    20 cm Proximal to Floor at Lateral Plantar Foot 34.3 1    10  cm Proximal to Floor at Lateral Malleoli 25 cm    Across MTP Joint 20.5 cm      Left Lower Extremity Lymphedema   10 cm Proximal to Suprapatella 53.2  cm    At Midpatella/Popliteal Crease 47 cm    30 cm Proximal to Floor at Lateral Plantar Foot 42.5 cm    20 cm Proximal to Floor at Lateral Plantar Foot 35.2 cm    10 cm Proximal to Floor at Lateral Malleoli 25 cm    Across MTP Joint 21.5 cm                      [facility] Adult PT Treatment/Exercise - 03/11/20 0001      Manual Therapy   Manual therapy comments remeasured bil LEs    Manual Lymphatic Drainage (MLD) in supine HOB elevated bil axillary nodes, bil inguino-axillary anastamosis, one leg at at time lateral thigh, medial to lateral thigh, lateral thigh, bottle neck of the knee, posterior knee, all surfaces of the lower leg, bil ankle, dorsum of the foot, re-worked all surfaces from foot to axilla 3x.                       PT Long Term Goals - 02/19/20 1710      PT LONG TERM GOAL #1   Title Pt will be ind with self  care for bilateral LE lymphedema to include use of compression garments and MLD and/or compression pump    Time 6    Period Weeks    Status New      PT LONG TERM GOAL #2   Title Pt will report decreased edema in the ankle after working by at least 50% to demonstrate improved lymphatic flow    Time 6    Period Weeks    Status New                 Plan - 03/11/20 1656    Clinical Impression Statement Pt is more worn down today from anxiety and radiation.  Has gone 2 weeks from last visit with no increase and slight decrease in the size of the Rt LE and no change to slight increase in the Lt LE.  No pitting in the Rt LE and mild pitting up to mid shin on the Left. Pt advised to spend more time on self MLD on the left leg.    PT Frequency --   every 2 weeks until needed   PT Duration 6 weeks    PT Treatment/Interventions ADLs/Self Care Home Management;Therapeutic exercise;Patient/family education;Manual lymph drainage;Manual techniques;Passive range of motion    PT Next Visit Plan how has swelling been with radiation x 2 weeks and chemo pill? bil leg MLD using axillary nodes, keep monitoring compression needs but pt seems happy with current, pump?    PT Home Exercise Plan self MLD    Consulted and Agree with Plan of Care Patient           Patient will benefit from skilled therapeutic intervention in order to improve the following deficits and impairments:     Visit Diagnosis: Lymphedema  Disorder of the skin and subcutaneous tissue related to radiation, unspecified  Aftercare following surgery for neoplasm  Muscle weakness (generalized)  Cramp and spasm  Unspecified lack of coordination     Problem List Patient Active Problem List   Diagnosis Date Noted  . 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 Genetic testing 01/27/2020  . Family history of breast cancer   . Family history of lung cancer   . Family history of ovarian cancer   . Family  history of non-Hodgkin's lymphoma   .  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 Port catheter in place 11/21/2016  . 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 Anal cancer ([place]) 11/03/2016  . Cystitis 05/06/2015  . Degenerative cervical disc 04/08/2015  . Trigger point of neck 03/26/2015  . Pain in joint, ankle and foot 04/23/2013  . Visit for preventive health examination 02/13/2013  . Routine gynecological examination 02/13/2013  . Family history of breast cancer in first degree relative 02/13/2013  . Rash and nonspecific skin eruption 02/13/2013  . 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 VITAMIN D DEFICIENCY 06/25/2008  . NECK PAIN 06/02/2008  . FOOT PAIN 06/02/2008  . OSTEOPENIA 06/02/2008    Stark Bray 03/11/2020, 5:03 PM  [place], [place], [address] Phone: [phone]   Fax:  [phone]  Name: Ashley Moore MRN: [id number] Date of Birth: [birth year]

## 2020-03-12 ENCOUNTER — Ambulatory Visit
Admission: RE | Admit: 2020-03-12 | Discharge: 2020-03-12 | Disposition: A | Discharge status: Home / Self Care | Payer: Medicare HMO | Source: Ambulatory Visit | Attending: Radiation Oncology | Admitting: Radiation Oncology

## 2020-03-12 DIAGNOSIS — C778 Secondary and unspecified malignant neoplasm of lymph nodes of multiple regions: Secondary | ICD-10-CM | POA: Diagnosis not present

## 2020-03-12 DIAGNOSIS — C772 Secondary and unspecified malignant neoplasm of intra-abdominal lymph nodes: Secondary | ICD-10-CM | POA: Diagnosis not present

## 2020-03-12 DIAGNOSIS — I890 Lymphedema, not elsewhere classified: Secondary | ICD-10-CM | POA: Diagnosis not present

## 2020-03-12 DIAGNOSIS — Z510 Encounter for antineoplastic radiation therapy: Secondary | ICD-10-CM | POA: Diagnosis not present

## 2020-03-12 DIAGNOSIS — N952 Postmenopausal atrophic vaginitis: Secondary | ICD-10-CM | POA: Diagnosis not present

## 2020-03-12 DIAGNOSIS — R110 Nausea: Secondary | ICD-10-CM | POA: Diagnosis not present

## 2020-03-12 DIAGNOSIS — C210 Malignant neoplasm of anus, unspecified: Secondary | ICD-10-CM | POA: Diagnosis not present

## 2020-03-12 DIAGNOSIS — F329 Major depressive disorder, single episode, unspecified: Secondary | ICD-10-CM | POA: Diagnosis not present

## 2020-03-13 ENCOUNTER — Encounter: Payer: Self-pay | Admitting: Nurse Practitioner

## 2020-03-13 ENCOUNTER — Encounter: Payer: Medicare HMO | Admitting: Rehabilitation

## 2020-03-13 ENCOUNTER — Inpatient Hospital Stay: Payer: Medicare HMO

## 2020-03-13 ENCOUNTER — Ambulatory Visit
Admission: RE | Admit: 2020-03-13 | Discharge: 2020-03-13 | Disposition: A | Discharge status: Home / Self Care | Payer: Medicare HMO | Source: Ambulatory Visit | Attending: Radiation Oncology | Admitting: Radiation Oncology

## 2020-03-13 ENCOUNTER — Inpatient Hospital Stay: Payer: Medicare HMO | Admitting: Nurse Practitioner

## 2020-03-13 ENCOUNTER — Other Ambulatory Visit: Payer: Self-pay

## 2020-03-13 VITALS — BP 128/79 | HR 98 | Temp 97.7°F | Resp 18 | Ht 62.0 in | Wt 181.1 lb

## 2020-03-13 DIAGNOSIS — Z510 Encounter for antineoplastic radiation therapy: Secondary | ICD-10-CM | POA: Diagnosis not present

## 2020-03-13 DIAGNOSIS — C210 Malignant neoplasm of anus, unspecified: Secondary | ICD-10-CM | POA: Diagnosis not present

## 2020-03-13 DIAGNOSIS — R309 Painful micturition, unspecified: Secondary | ICD-10-CM | POA: Diagnosis not present

## 2020-03-13 DIAGNOSIS — N952 Postmenopausal atrophic vaginitis: Secondary | ICD-10-CM | POA: Diagnosis not present

## 2020-03-13 DIAGNOSIS — C778 Secondary and unspecified malignant neoplasm of lymph nodes of multiple regions: Secondary | ICD-10-CM | POA: Diagnosis not present

## 2020-03-13 DIAGNOSIS — C772 Secondary and unspecified malignant neoplasm of intra-abdominal lymph nodes: Secondary | ICD-10-CM | POA: Diagnosis not present

## 2020-03-13 DIAGNOSIS — Z923 Personal history of irradiation: Secondary | ICD-10-CM | POA: Diagnosis not present

## 2020-03-13 DIAGNOSIS — R110 Nausea: Secondary | ICD-10-CM | POA: Diagnosis not present

## 2020-03-13 DIAGNOSIS — I890 Lymphedema, not elsewhere classified: Secondary | ICD-10-CM | POA: Diagnosis not present

## 2020-03-13 DIAGNOSIS — F329 Major depressive disorder, single episode, unspecified: Secondary | ICD-10-CM | POA: Diagnosis not present

## 2020-03-13 LAB — CBC WITH DIFFERENTIAL (CANCER CENTER ONLY)
Abs Immature Granulocytes: 0.01 10*3/uL (ref 0.00–0.07)
Basophils Absolute: 0 10*3/uL (ref 0.0–0.1)
Basophils Relative: 1 %
Eosinophils Absolute: 0.1 10*3/uL (ref 0.0–0.5)
Eosinophils Relative: 4 %
HCT: 39.5 % (ref 36.0–46.0)
Hemoglobin: 12.7 g/dL (ref 12.0–15.0)
Immature Granulocytes: 0 %
Lymphocytes Relative: 13 %
Lymphs Abs: 0.4 10*3/uL — ABNORMAL LOW (ref 0.7–4.0)
MCH: 28.5 pg (ref 26.0–34.0)
MCHC: 32.2 g/dL (ref 30.0–36.0)
MCV: 88.6 fL (ref 80.0–100.0)
Monocytes Absolute: 0.3 10*3/uL (ref 0.1–1.0)
Monocytes Relative: 8 %
Neutro Abs: 2.4 10*3/uL (ref 1.7–7.7)
Neutrophils Relative %: 74 %
Platelet Count: 232 10*3/uL (ref 150–400)
RBC: 4.46 MIL/uL (ref 3.87–5.11)
RDW: 14.7 % (ref 11.5–15.5)
WBC Count: 3.3 10*3/uL — ABNORMAL LOW (ref 4.0–10.5)
nRBC: 0 % (ref 0.0–0.2)

## 2020-03-13 LAB — CMP (CANCER CENTER ONLY)
ALT: 14 U/L (ref 0–44)
AST: 24 U/L (ref 15–41)
Albumin: 3.5 g/dL (ref 3.5–5.0)
Alkaline Phosphatase: 72 U/L (ref 38–126)
Anion gap: 8 (ref 5–15)
BUN: 14 mg/dL (ref 8–23)
CO2: 26 mmol/L (ref 22–32)
Calcium: 9.3 mg/dL (ref 8.9–10.3)
Chloride: 106 mmol/L (ref 98–111)
Creatinine: 0.8 mg/dL (ref 0.44–1.00)
GFR, Est AFR Am: >60 mL/min (ref >60)
GFR, Estimated: >60 mL/min (ref >60)
Glucose, Bld: 106 mg/dL — ABNORMAL HIGH (ref 70–99)
Potassium: 3.9 mmol/L (ref 3.5–5.1)
Sodium: 140 mmol/L (ref 135–145)
Total Bilirubin: 0.3 mg/dL (ref 0.3–1.2)
Total Protein: 7.3 g/dL (ref 6.5–8.1)

## 2020-03-13 NOTE — Progress Notes (Signed)
[place] OFFICE PROGRESS NOTE   Diagnosis: Anal cancer  INTERVAL HISTORY:   Ashley Moore returns as scheduled.  She continues radiation and Xeloda.  She denies nausea/vomiting.  No mouth sores.  No change in baseline bowel habits.  No hand or foot pain or redness.  She has intermittent pain related to the stent.  She takes Tylenol as needed.  She recently noticed a "sore" area at the left lower inner lip.  She thinks this may be due to a new "mouthguard".  Objective:  Vital signs in last 24 hours:  Blood pressure 128/79, pulse 98, temperature 97.7 F (36.5 C), temperature source Oral, resp. rate 18, height 5\' 2"  (1.575 m), weight 181 lb 1.6 oz (82.1 kg), SpO2 100 %.    HEENT: No thrush or ulcers.  Small area of redness at the lower inner lip/mucosa. Resp: Lungs clear bilaterally. Cardio: Regular rate and rhythm. GI: Abdomen soft and nontender.  No hepatomegaly. Vascular: No leg edema. Skin: Palms without erythema.   Lab Results:  Lab Results  Component Value Date   WBC 3.3 (L) 03/13/2020   HGB 12.7 03/13/2020   HCT 39.5 03/13/2020   MCV 88.6 03/13/2020   PLT 232 03/13/2020   NEUTROABS 2.4 03/13/2020    Imaging:  No results found.  Medications: I have reviewed the patient's current medications.  Assessment/Plan: 1. Anal cancer ? CT abdomen/pelvis 10/21/2016-thickening of the anus extending to the junction between the anus and rectum with a large stool ball in the rectum and mild fat stranding posterior to the rectum. Enlarged lymph nodes in the right and left inguinal regions. A few mildly prominent nodesseen posterior to the rectum. ? 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 ? PET scan 76/[id number] hypermetabolic anal mass with hypermetabolic metastases to the groin region bilaterally, left pelvic sidewall and presacral space. ? Initiation of radiation and cycle 1 5-FU/mitomycin C 11/14/2016 ? Cycle 2 5-FU/mitomycin  C 12/12/2016 (5-FU dose reduced due to mucositis, diarrhea, skin breakdown) ? Radiation completed 12/23/2016 ? CT abdomen/pelvis 04/14/2017-resolution of anal mass and bilateral inguinal lymphadenopathy. No residual tumor seen. ? CT abdomen/pelvis 01/28/2020-right hydroureteronephrosis, transition at the level of the mid right ureter, retroperitoneal lymphadenopathy ? CT renal stone study 01/28/2020-severe right hydronephrosis and proximal right hydroureter, 8 mm area of increased attenuation in the proximal right ureter-stone versus soft tissue mass ? PET 02/13/2020-hypermetabolic right retroperitoneal nodal metastases in the aortocaval and posterior pericaval chains, nonspecific mild anal wall hypermetabolism, right nephroureteral stent ? 02/21/2020 anorectal exam per Dr. Ashley Jacobs radiation changes of the skin around the anal margin.  Posterior midline smooth scarring.  Mildly stenotic.  Stenosis seems better.  No palpable concerning lesions.  Entire anal canal and distal rectum feels smooth and healthy.  Partial anoscopy performed with no lesions in the anal canal. ? Xeloda/radiation beginning 03/02/2020 2. Left labial lesions. Question direct extension from the anal cancer versus metastatic disease from anal cancer versus a separate malignant process. 3. History of pain and bleeding secondary to #1 and skin breakdown 4. 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 5. Multiple family members with breast cancer. 6. History of hypokalemia-likely secondary to decreased nutritional intake and diarrhea; potassium in normal range 01/16/2017. No longer taking a potassium supplement. 7. Right hydroureteronephrosis on CT 01/28/2020, status post a cystoscopy/pyelogram 01/28/2020 confirming extrinsic compression of the right ureter, status post stent placement  Disposition: Ashley Moore appears stable.  She continues radiation and Xeloda.  Overall  she is tolerating  well.  We reviewed the CBC from today.  Counts adequate to continue with treatment.  The white count is mildly low, absolute neutrophil count is in normal range.  She will return for lab and follow-up in 2 weeks.  She will contact the office in the interim with any problems.  We specifically discussed signs/symptoms of mucositis.    Ned Card ANP/GNP-BC   03/13/2020  2:21 PM

## 2020-03-13 NOTE — Addendum Note (Signed)
Addended by: Jesse Fall on: 03/13/2020 02:48 PM   Modules accepted: Orders

## 2020-03-16 ENCOUNTER — Ambulatory Visit
Admission: RE | Admit: 2020-03-16 | Discharge: 2020-03-16 | Disposition: A | Discharge status: Home / Self Care | Payer: Medicare HMO | Source: Ambulatory Visit | Attending: Radiation Oncology | Admitting: Radiation Oncology

## 2020-03-16 ENCOUNTER — Telehealth: Payer: Self-pay | Admitting: Nurse Practitioner

## 2020-03-16 ENCOUNTER — Other Ambulatory Visit: Payer: Self-pay

## 2020-03-16 DIAGNOSIS — C210 Malignant neoplasm of anus, unspecified: Secondary | ICD-10-CM | POA: Diagnosis not present

## 2020-03-16 DIAGNOSIS — I890 Lymphedema, not elsewhere classified: Secondary | ICD-10-CM | POA: Diagnosis not present

## 2020-03-16 DIAGNOSIS — N952 Postmenopausal atrophic vaginitis: Secondary | ICD-10-CM | POA: Diagnosis not present

## 2020-03-16 DIAGNOSIS — C772 Secondary and unspecified malignant neoplasm of intra-abdominal lymph nodes: Secondary | ICD-10-CM | POA: Diagnosis not present

## 2020-03-16 DIAGNOSIS — F329 Major depressive disorder, single episode, unspecified: Secondary | ICD-10-CM | POA: Diagnosis not present

## 2020-03-16 DIAGNOSIS — R110 Nausea: Secondary | ICD-10-CM | POA: Diagnosis not present

## 2020-03-16 DIAGNOSIS — C778 Secondary and unspecified malignant neoplasm of lymph nodes of multiple regions: Secondary | ICD-10-CM | POA: Diagnosis not present

## 2020-03-16 DIAGNOSIS — Z510 Encounter for antineoplastic radiation therapy: Secondary | ICD-10-CM | POA: Diagnosis not present

## 2020-03-16 NOTE — Telephone Encounter (Signed)
Scheduled per 6/25 los. Called and spoke with pt, confirmed 7/9 appts

## 2020-03-17 ENCOUNTER — Ambulatory Visit
Admission: RE | Admit: 2020-03-17 | Discharge: 2020-03-17 | Disposition: A | Discharge status: Home / Self Care | Payer: Medicare HMO | Source: Ambulatory Visit | Attending: Radiation Oncology | Admitting: Radiation Oncology

## 2020-03-17 ENCOUNTER — Other Ambulatory Visit: Payer: Self-pay

## 2020-03-17 DIAGNOSIS — N952 Postmenopausal atrophic vaginitis: Secondary | ICD-10-CM | POA: Diagnosis not present

## 2020-03-17 DIAGNOSIS — R110 Nausea: Secondary | ICD-10-CM | POA: Diagnosis not present

## 2020-03-17 DIAGNOSIS — I890 Lymphedema, not elsewhere classified: Secondary | ICD-10-CM | POA: Diagnosis not present

## 2020-03-17 DIAGNOSIS — C778 Secondary and unspecified malignant neoplasm of lymph nodes of multiple regions: Secondary | ICD-10-CM | POA: Diagnosis not present

## 2020-03-17 DIAGNOSIS — C772 Secondary and unspecified malignant neoplasm of intra-abdominal lymph nodes: Secondary | ICD-10-CM | POA: Diagnosis not present

## 2020-03-17 DIAGNOSIS — F329 Major depressive disorder, single episode, unspecified: Secondary | ICD-10-CM | POA: Diagnosis not present

## 2020-03-17 DIAGNOSIS — Z510 Encounter for antineoplastic radiation therapy: Secondary | ICD-10-CM | POA: Diagnosis not present

## 2020-03-17 DIAGNOSIS — C210 Malignant neoplasm of anus, unspecified: Secondary | ICD-10-CM | POA: Diagnosis not present

## 2020-03-18 ENCOUNTER — Other Ambulatory Visit: Payer: Self-pay

## 2020-03-18 ENCOUNTER — Ambulatory Visit
Admission: RE | Admit: 2020-03-18 | Discharge: 2020-03-18 | Disposition: A | Discharge status: Home / Self Care | Payer: Medicare HMO | Source: Ambulatory Visit | Attending: Radiation Oncology | Admitting: Radiation Oncology

## 2020-03-18 DIAGNOSIS — I890 Lymphedema, not elsewhere classified: Secondary | ICD-10-CM | POA: Diagnosis not present

## 2020-03-18 DIAGNOSIS — C772 Secondary and unspecified malignant neoplasm of intra-abdominal lymph nodes: Secondary | ICD-10-CM | POA: Diagnosis not present

## 2020-03-18 DIAGNOSIS — R110 Nausea: Secondary | ICD-10-CM | POA: Diagnosis not present

## 2020-03-18 DIAGNOSIS — N952 Postmenopausal atrophic vaginitis: Secondary | ICD-10-CM | POA: Diagnosis not present

## 2020-03-18 DIAGNOSIS — C778 Secondary and unspecified malignant neoplasm of lymph nodes of multiple regions: Secondary | ICD-10-CM | POA: Diagnosis not present

## 2020-03-18 DIAGNOSIS — F329 Major depressive disorder, single episode, unspecified: Secondary | ICD-10-CM | POA: Diagnosis not present

## 2020-03-18 DIAGNOSIS — C210 Malignant neoplasm of anus, unspecified: Secondary | ICD-10-CM | POA: Diagnosis not present

## 2020-03-18 DIAGNOSIS — Z510 Encounter for antineoplastic radiation therapy: Secondary | ICD-10-CM | POA: Diagnosis not present

## 2020-03-19 ENCOUNTER — Ambulatory Visit
Admission: RE | Admit: 2020-03-19 | Discharge: 2020-03-19 | Disposition: A | Discharge status: Home / Self Care | Payer: Medicare HMO | Source: Ambulatory Visit | Attending: Radiation Oncology | Admitting: Radiation Oncology

## 2020-03-19 ENCOUNTER — Other Ambulatory Visit: Payer: Self-pay

## 2020-03-19 ENCOUNTER — Other Ambulatory Visit: Payer: Self-pay | Admitting: Oncology

## 2020-03-19 DIAGNOSIS — F329 Major depressive disorder, single episode, unspecified: Secondary | ICD-10-CM | POA: Diagnosis not present

## 2020-03-19 DIAGNOSIS — C210 Malignant neoplasm of anus, unspecified: Secondary | ICD-10-CM | POA: Insufficient documentation

## 2020-03-19 DIAGNOSIS — N952 Postmenopausal atrophic vaginitis: Secondary | ICD-10-CM | POA: Diagnosis not present

## 2020-03-19 DIAGNOSIS — C778 Secondary and unspecified malignant neoplasm of lymph nodes of multiple regions: Secondary | ICD-10-CM | POA: Diagnosis not present

## 2020-03-19 DIAGNOSIS — C772 Secondary and unspecified malignant neoplasm of intra-abdominal lymph nodes: Secondary | ICD-10-CM | POA: Insufficient documentation

## 2020-03-19 DIAGNOSIS — R110 Nausea: Secondary | ICD-10-CM | POA: Insufficient documentation

## 2020-03-19 DIAGNOSIS — I890 Lymphedema, not elsewhere classified: Secondary | ICD-10-CM | POA: Diagnosis not present

## 2020-03-19 DIAGNOSIS — Z510 Encounter for antineoplastic radiation therapy: Secondary | ICD-10-CM | POA: Diagnosis not present

## 2020-03-20 ENCOUNTER — Other Ambulatory Visit: Payer: Self-pay

## 2020-03-20 ENCOUNTER — Ambulatory Visit
Admission: RE | Admit: 2020-03-20 | Discharge: 2020-03-20 | Disposition: A | Discharge status: Home / Self Care | Payer: Medicare HMO | Source: Ambulatory Visit | Attending: Radiation Oncology | Admitting: Radiation Oncology

## 2020-03-20 DIAGNOSIS — Z510 Encounter for antineoplastic radiation therapy: Secondary | ICD-10-CM | POA: Diagnosis not present

## 2020-03-20 DIAGNOSIS — C778 Secondary and unspecified malignant neoplasm of lymph nodes of multiple regions: Secondary | ICD-10-CM | POA: Diagnosis not present

## 2020-03-20 DIAGNOSIS — R110 Nausea: Secondary | ICD-10-CM | POA: Diagnosis not present

## 2020-03-20 DIAGNOSIS — C772 Secondary and unspecified malignant neoplasm of intra-abdominal lymph nodes: Secondary | ICD-10-CM | POA: Diagnosis not present

## 2020-03-20 DIAGNOSIS — I890 Lymphedema, not elsewhere classified: Secondary | ICD-10-CM | POA: Diagnosis not present

## 2020-03-20 DIAGNOSIS — F329 Major depressive disorder, single episode, unspecified: Secondary | ICD-10-CM | POA: Diagnosis not present

## 2020-03-20 DIAGNOSIS — N952 Postmenopausal atrophic vaginitis: Secondary | ICD-10-CM | POA: Diagnosis not present

## 2020-03-20 DIAGNOSIS — C210 Malignant neoplasm of anus, unspecified: Secondary | ICD-10-CM | POA: Diagnosis not present

## 2020-03-20 MED ORDER — SONAFINE EX EMUL: 1.0000 "application " | Freq: Two times a day (BID) | CUTANEOUS | Status: DC

## 2020-03-24 ENCOUNTER — Ambulatory Visit
Admission: RE | Admit: 2020-03-24 | Discharge: 2020-03-24 | Disposition: A | Discharge status: Home / Self Care | Payer: Medicare HMO | Source: Ambulatory Visit | Attending: Radiation Oncology | Admitting: Radiation Oncology

## 2020-03-24 ENCOUNTER — Other Ambulatory Visit: Payer: Self-pay

## 2020-03-24 DIAGNOSIS — Z510 Encounter for antineoplastic radiation therapy: Secondary | ICD-10-CM | POA: Diagnosis not present

## 2020-03-24 DIAGNOSIS — C772 Secondary and unspecified malignant neoplasm of intra-abdominal lymph nodes: Secondary | ICD-10-CM | POA: Diagnosis not present

## 2020-03-24 DIAGNOSIS — I890 Lymphedema, not elsewhere classified: Secondary | ICD-10-CM | POA: Diagnosis not present

## 2020-03-24 DIAGNOSIS — F329 Major depressive disorder, single episode, unspecified: Secondary | ICD-10-CM | POA: Diagnosis not present

## 2020-03-24 DIAGNOSIS — N952 Postmenopausal atrophic vaginitis: Secondary | ICD-10-CM | POA: Diagnosis not present

## 2020-03-24 DIAGNOSIS — R110 Nausea: Secondary | ICD-10-CM | POA: Diagnosis not present

## 2020-03-24 DIAGNOSIS — C778 Secondary and unspecified malignant neoplasm of lymph nodes of multiple regions: Secondary | ICD-10-CM | POA: Diagnosis not present

## 2020-03-24 DIAGNOSIS — C210 Malignant neoplasm of anus, unspecified: Secondary | ICD-10-CM | POA: Diagnosis not present

## 2020-03-25 ENCOUNTER — Ambulatory Visit
Admission: RE | Admit: 2020-03-25 | Discharge: 2020-03-25 | Disposition: A | Discharge status: Home / Self Care | Payer: Medicare HMO | Source: Ambulatory Visit | Attending: Radiation Oncology | Admitting: Radiation Oncology

## 2020-03-25 DIAGNOSIS — R110 Nausea: Secondary | ICD-10-CM | POA: Diagnosis not present

## 2020-03-25 DIAGNOSIS — C210 Malignant neoplasm of anus, unspecified: Secondary | ICD-10-CM | POA: Diagnosis not present

## 2020-03-25 DIAGNOSIS — C772 Secondary and unspecified malignant neoplasm of intra-abdominal lymph nodes: Secondary | ICD-10-CM | POA: Diagnosis not present

## 2020-03-25 DIAGNOSIS — F329 Major depressive disorder, single episode, unspecified: Secondary | ICD-10-CM | POA: Diagnosis not present

## 2020-03-25 DIAGNOSIS — C778 Secondary and unspecified malignant neoplasm of lymph nodes of multiple regions: Secondary | ICD-10-CM | POA: Diagnosis not present

## 2020-03-25 DIAGNOSIS — N952 Postmenopausal atrophic vaginitis: Secondary | ICD-10-CM | POA: Diagnosis not present

## 2020-03-25 DIAGNOSIS — Z510 Encounter for antineoplastic radiation therapy: Secondary | ICD-10-CM | POA: Diagnosis not present

## 2020-03-25 DIAGNOSIS — I890 Lymphedema, not elsewhere classified: Secondary | ICD-10-CM | POA: Diagnosis not present

## 2020-03-25 MED FILL — CAPECITABINE 500 MG TABLET: 500 | 9 days supply | Qty: 52 | Fill #0

## 2020-03-26 ENCOUNTER — Ambulatory Visit
Admission: RE | Admit: 2020-03-26 | Discharge: 2020-03-26 | Disposition: A | Discharge status: Home / Self Care | Payer: Medicare HMO | Source: Ambulatory Visit | Attending: Radiation Oncology | Admitting: Radiation Oncology

## 2020-03-26 ENCOUNTER — Encounter: Payer: Self-pay | Admitting: Rehabilitation

## 2020-03-26 ENCOUNTER — Ambulatory Visit: Payer: Medicare HMO | Attending: Radiation Oncology | Admitting: Rehabilitation

## 2020-03-26 ENCOUNTER — Other Ambulatory Visit: Payer: Self-pay

## 2020-03-26 DIAGNOSIS — Z483 Aftercare following surgery for neoplasm: Secondary | ICD-10-CM

## 2020-03-26 DIAGNOSIS — L599 Disorder of the skin and subcutaneous tissue related to radiation, unspecified: Secondary | ICD-10-CM

## 2020-03-26 DIAGNOSIS — M6281 Muscle weakness (generalized): Secondary | ICD-10-CM | POA: Diagnosis not present

## 2020-03-26 DIAGNOSIS — N952 Postmenopausal atrophic vaginitis: Secondary | ICD-10-CM | POA: Diagnosis not present

## 2020-03-26 DIAGNOSIS — C772 Secondary and unspecified malignant neoplasm of intra-abdominal lymph nodes: Secondary | ICD-10-CM | POA: Diagnosis not present

## 2020-03-26 DIAGNOSIS — I890 Lymphedema, not elsewhere classified: Secondary | ICD-10-CM | POA: Insufficient documentation

## 2020-03-26 DIAGNOSIS — Z510 Encounter for antineoplastic radiation therapy: Secondary | ICD-10-CM | POA: Diagnosis not present

## 2020-03-26 DIAGNOSIS — R110 Nausea: Secondary | ICD-10-CM | POA: Diagnosis not present

## 2020-03-26 DIAGNOSIS — R279 Unspecified lack of coordination: Secondary | ICD-10-CM | POA: Diagnosis not present

## 2020-03-26 DIAGNOSIS — F329 Major depressive disorder, single episode, unspecified: Secondary | ICD-10-CM | POA: Diagnosis not present

## 2020-03-26 DIAGNOSIS — R252 Cramp and spasm: Secondary | ICD-10-CM | POA: Diagnosis not present

## 2020-03-26 DIAGNOSIS — C210 Malignant neoplasm of anus, unspecified: Secondary | ICD-10-CM | POA: Diagnosis not present

## 2020-03-26 DIAGNOSIS — C778 Secondary and unspecified malignant neoplasm of lymph nodes of multiple regions: Secondary | ICD-10-CM | POA: Diagnosis not present

## 2020-03-26 NOTE — Therapy (Signed)
[place], [place], [address] Phone: [phone]   Fax:  [phone]  Physical Therapy Treatment  Patient Details  Name: Ashley Moore MRN: [id number] Date of Birth: 1951/04/01 Referring Provider (PT): Shona Simpson PA-C   Encounter Date: 03/26/2020   PT End of Session - 03/26/20 1453    Visit Number 6    Number of Visits 12    Date for PT Re-Evaluation 04/01/20    Authorization - Visit Number 6    Authorization - Number of Visits 12    PT Start Time 2585    PT Stop Time 1455    PT Time Calculation (min) 50 min    Activity Tolerance Patient tolerated treatment well    Behavior During Therapy [facility] for tasks assessed/performed           Past Medical History:  Diagnosis Date  . Bowen's disease    excised 1992  . Family history of breast cancer   . Family history of lung cancer   . Family history of non-Hodgkin's lymphoma   . Family history of ovarian cancer   . Headache   . Hx of varicella   . 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([place])   . Thyroid cancer [facility])     Past Surgical History:  Procedure Laterality Date  . BUNIONECTOMY Right   . CYSTOSCOPY WITH RETROGRADE PYELOGRAM, URETEROSCOPY AND STENT PLACEMENT Right 01/28/2020   Procedure: CYSTOSCOPY WITH RETROGRADE PYELOGRAM, URETEROSCOPY AND STENT PLACEMENT;  Surgeon: Alexis Frock, MD;  Location[facility];  Service: Urology;  Laterality: Right;  . IR GENERIC HISTORICAL  11/14/2016   IR US GUIDE VASC ACCESS RIGHT 11/14/2016 [facility]INTER[facility]  . IR GENERIC HISTORICAL  11/14/2016   IR FLUORO GUIDE CV LINE RIGHT 11/14/2016 [facility]  . IR GENERIC HISTORICAL  12/12/2016   IR US GUIDE VASC ACCESS RIGHT 12/12/2016 [name], MD [facility]INTER[facility]  . IR GENERIC HISTORICAL  12/12/2016   IR FLUORO GUIDE CV LINE RIGHT 12/12/2016 [name], MD [facility]  . SKIN CANCER EXCISION     bowens disease    There were no vitals filed for this visit.   Subjective Assessment -  03/26/20 1409    Subjective I have been just been having bad gas and diarrhea.  I have been at work all day sitting at my desk.    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 Pt initially diagnosed in 2018 with history of Chemotherapy and radiation.  Rt nephroureteral stent in 2021 due to hydronephrosis now decompressed.  Radiation will be performed again most likely with concurrent chemotherapy    Currently in Pain? No/denies                             [facility] Adult PT Treatment/Exercise - 03/26/20 0001      Manual Therapy   Manual Lymphatic Drainage (MLD) in supine HOB elevated bil axillary nodes, bil inguino-axillary anastamosis, one leg at at time lateral thigh, medial to lateral thigh, lateral thigh, bottle neck of the knee, posterior knee, all surfaces of the lower leg, bil ankle, dorsum of the foot, re-worked all surfaces from foot to axilla 3x.                       PT Long Term Goals - 02/19/20 1710      PT LONG TERM GOAL #1   Title  Pt will be ind with self care for bilateral LE lymphedema to include use of compression garments and MLD and/or compression pump    Time 6    Period Weeks    Status New      PT LONG TERM GOAL #2   Title Pt will report decreased edema in the ankle after working by at least 50% to demonstrate improved lymphatic flow    Time 6    Period Weeks    Status New                 Plan - 03/26/20 1454    Clinical Impression Statement Pt continues to tolerate radiation well but is have more gastrointestinal effects from chemotherapy, more panic attacks, and overall more fatigue with ongoing radiation.  bil LEs still with pitting edema form the ankle and the lower 1/3rd of the leg improved with MLD    PT Duration 6 weeks    PT Treatment/Interventions ADLs/Self Care Home Management;Therapeutic exercise;Patient/family education;Manual lymph drainage;Manual techniques;Passive range of motion     PT Next Visit Plan how has swelling been with radiation x 2 weeks and chemo pill? bil leg MLD using axillary nodes, keep monitoring compression needs but pt seems happy with current, pump?    Consulted and Agree with Plan of Care Patient           Patient will benefit from skilled therapeutic intervention in order to improve the following deficits and impairments:     Visit Diagnosis: Disorder of the skin and subcutaneous tissue related to radiation, unspecified  Aftercare following surgery for neoplasm  Muscle weakness (generalized)  Cramp and spasm  Unspecified lack of coordination     Problem List Patient Active Problem List   Diagnosis Date Noted  . 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 Genetic testing 01/27/2020  . Family history of breast cancer   . Family history of lung cancer   . Family history of ovarian cancer   . Family history of non-Hodgkin's lymphoma   . 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 Port catheter in place 11/21/2016  . 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 Anal cancer ([place]) 11/03/2016  . Cystitis 05/06/2015  . Degenerative cervical disc 04/08/2015  . Trigger point of neck 03/26/2015  . Pain in joint, ankle and foot 04/23/2013  . Visit for preventive health examination 02/13/2013  . Routine gynecological examination 02/13/2013  . Family history of breast cancer in first degree relative 02/13/2013  . Rash and nonspecific skin eruption 02/13/2013  . 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 VITAMIN D DEFICIENCY 06/25/2008  . NECK PAIN 06/02/2008  . FOOT PAIN 06/02/2008  . OSTEOPENIA 06/02/2008    Stark Bray 03/26/2020, 2:56 PM  [place], [place], [address] Phone: [phone]   Fax:  [phone]  Name: Ashley Moore MRN: [id number] Date of Birth: 1950/10/19

## 2020-03-27 ENCOUNTER — Encounter: Payer: Self-pay | Admitting: Nurse Practitioner

## 2020-03-27 ENCOUNTER — Ambulatory Visit
Admission: RE | Admit: 2020-03-27 | Discharge: 2020-03-27 | Disposition: A | Discharge status: Home / Self Care | Payer: Medicare HMO | Source: Ambulatory Visit | Attending: Radiation Oncology | Admitting: Radiation Oncology

## 2020-03-27 ENCOUNTER — Other Ambulatory Visit: Payer: Self-pay

## 2020-03-27 ENCOUNTER — Inpatient Hospital Stay: Payer: Medicare HMO

## 2020-03-27 ENCOUNTER — Inpatient Hospital Stay: Payer: Medicare HMO | Attending: Gynecologic Oncology | Admitting: Nurse Practitioner

## 2020-03-27 VITALS — BP 124/75 | HR 78 | Temp 97.7°F | Resp 16 | Ht 62.0 in | Wt 179.2 lb

## 2020-03-27 DIAGNOSIS — C210 Malignant neoplasm of anus, unspecified: Secondary | ICD-10-CM

## 2020-03-27 DIAGNOSIS — N952 Postmenopausal atrophic vaginitis: Secondary | ICD-10-CM | POA: Diagnosis not present

## 2020-03-27 DIAGNOSIS — Z803 Family history of malignant neoplasm of breast: Secondary | ICD-10-CM | POA: Insufficient documentation

## 2020-03-27 DIAGNOSIS — N1330 Unspecified hydronephrosis: Secondary | ICD-10-CM | POA: Insufficient documentation

## 2020-03-27 DIAGNOSIS — C772 Secondary and unspecified malignant neoplasm of intra-abdominal lymph nodes: Secondary | ICD-10-CM | POA: Diagnosis not present

## 2020-03-27 DIAGNOSIS — R110 Nausea: Secondary | ICD-10-CM | POA: Diagnosis not present

## 2020-03-27 DIAGNOSIS — Z510 Encounter for antineoplastic radiation therapy: Secondary | ICD-10-CM | POA: Diagnosis not present

## 2020-03-27 DIAGNOSIS — C778 Secondary and unspecified malignant neoplasm of lymph nodes of multiple regions: Secondary | ICD-10-CM | POA: Diagnosis not present

## 2020-03-27 DIAGNOSIS — F329 Major depressive disorder, single episode, unspecified: Secondary | ICD-10-CM | POA: Diagnosis not present

## 2020-03-27 DIAGNOSIS — I890 Lymphedema, not elsewhere classified: Secondary | ICD-10-CM | POA: Diagnosis not present

## 2020-03-27 LAB — CMP (CANCER CENTER ONLY)
ALT: 13 U/L (ref 0–44)
AST: 25 U/L (ref 15–41)
Albumin: 3.5 g/dL (ref 3.5–5.0)
Alkaline Phosphatase: 68 U/L (ref 38–126)
Anion gap: 7 (ref 5–15)
BUN: 11 mg/dL (ref 8–23)
CO2: 24 mmol/L (ref 22–32)
Calcium: 9 mg/dL (ref 8.9–10.3)
Chloride: 106 mmol/L (ref 98–111)
Creatinine: 0.77 mg/dL (ref 0.44–1.00)
GFR, Est AFR Am: >60 mL/min (ref >60)
GFR, Estimated: >60 mL/min (ref >60)
Glucose, Bld: 105 mg/dL — ABNORMAL HIGH (ref 70–99)
Potassium: 3.6 mmol/L (ref 3.5–5.1)
Sodium: 137 mmol/L (ref 135–145)
Total Bilirubin: 0.3 mg/dL (ref 0.3–1.2)
Total Protein: 6.9 g/dL (ref 6.5–8.1)

## 2020-03-27 LAB — CBC WITH DIFFERENTIAL (CANCER CENTER ONLY)
Abs Immature Granulocytes: 0 10*3/uL (ref 0.00–0.07)
Basophils Absolute: 0 10*3/uL (ref 0.0–0.1)
Basophils Relative: 0 %
Eosinophils Absolute: 0.2 10*3/uL (ref 0.0–0.5)
Eosinophils Relative: 5 %
HCT: 36.7 % (ref 36.0–46.0)
Hemoglobin: 12 g/dL (ref 12.0–15.0)
Immature Granulocytes: 0 %
Lymphocytes Relative: 9 %
Lymphs Abs: 0.3 10*3/uL — ABNORMAL LOW (ref 0.7–4.0)
MCH: 29.1 pg (ref 26.0–34.0)
MCHC: 32.7 g/dL (ref 30.0–36.0)
MCV: 88.9 fL (ref 80.0–100.0)
Monocytes Absolute: 0.5 10*3/uL (ref 0.1–1.0)
Monocytes Relative: 14 %
Neutro Abs: 2.4 10*3/uL (ref 1.7–7.7)
Neutrophils Relative %: 72 %
Platelet Count: 161 10*3/uL (ref 150–400)
RBC: 4.13 MIL/uL (ref 3.87–5.11)
RDW: 16.1 % — ABNORMAL HIGH (ref 11.5–15.5)
WBC Count: 3.3 10*3/uL — ABNORMAL LOW (ref 4.0–10.5)
nRBC: 0 % (ref 0.0–0.2)

## 2020-03-27 NOTE — Progress Notes (Signed)
[place] OFFICE PROGRESS NOTE   Diagnosis: Anal cancer  INTERVAL HISTORY:   Ashley Moore returns as scheduled.  She continues radiation and Xeloda.  She is intermittently "queasy".  She is typically able to relieve with ginger ale.  No mouth sores.  She occasionally has diarrhea between the hours of 2 and 3:00 PM.  This does not occur on a daily basis.  No hand or foot pain or redness.  She reports intermittent tinnitus since beginning treatment.  Objective:  Vital signs in last 24 hours:  Blood pressure 124/75, pulse 78, temperature 97.7 F (36.5 C), temperature source Temporal, resp. rate 16, height 5\' 2"  (1.575 m), weight 179 lb 3.2 oz (81.3 kg), SpO2 100 %.    HEENT: No thrush or ulcers. Resp: Lungs clear bilaterally. Cardio: Regular rate and rhythm. GI: Abdomen soft and nontender.  No hepatomegaly. Vascular: No leg edema. Skin: Palms without erythema.   Lab Results:  Lab Results  Component Value Date   WBC 3.3 (L) 03/27/2020   HGB 12.0 03/27/2020   HCT 36.7 03/27/2020   MCV 88.9 03/27/2020   PLT 161 03/27/2020   NEUTROABS 2.4 03/27/2020    Imaging:  No results found.  Medications: I have reviewed the patient's current medications.  Assessment/Plan: 1. Anal cancer ? CT abdomen/pelvis 10/21/2016-thickening of the anus extending to the junction between the anus and rectum with a large stool ball in the rectum and mild fat stranding posterior to the rectum. Enlarged lymph nodes in the right and left inguinal regions. A few mildly prominent nodesseen posterior to the rectum. ? 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 ? PET scan 40/[id number] hypermetabolic anal mass with hypermetabolic metastases to the groin region bilaterally, left pelvic sidewall and presacral space. ? Initiation of radiation and cycle 1 5-FU/mitomycin C 11/14/2016 ? Cycle 2 5-FU/mitomycin C 12/12/2016 (5-FU dose reduced due to mucositis, diarrhea,  skin breakdown) ? Radiation completed 12/23/2016 ? CT abdomen/pelvis 04/14/2017-resolution of anal mass and bilateral inguinal lymphadenopathy. No residual tumor seen. ? CT abdomen/pelvis 01/28/2020-right hydroureteronephrosis, transition at the level of the mid right ureter, retroperitoneal lymphadenopathy ? CT renal stone study 01/28/2020-severe right hydronephrosis and proximal right hydroureter, 8 mm area of increased attenuation in the proximal right ureter-stone versus soft tissue mass ? PET 02/13/2020-hypermetabolic right retroperitoneal nodal metastases in the aortocaval and posterior pericaval chains, nonspecific mild anal wall hypermetabolism, right nephroureteral stent ? 02/21/2020 anorectal exam per Dr. Ashley Moore radiation changes of the skin around the anal margin. Posterior midline smooth scarring. Mildly stenotic. Stenosis seems better. No palpable concerning lesions. Entire anal canal and distal rectum feels smooth and healthy. Partial anoscopy performed with no lesions in the anal canal. ? Xeloda/radiation beginning 03/02/2020 2. Left labial lesions. Question direct extension from the anal cancer versus metastatic disease from anal cancer versus a separate malignant process. 3. History of pain and bleeding secondary to #1 and skin breakdown 4. 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 5. Multiple family members with breast cancer. 6. History of hypokalemia-likely secondary to decreased nutritional intake and diarrhea; potassium in normal range 01/16/2017. No longer taking a potassium supplement. 7. Right hydroureteronephrosis on CT 01/28/2020, status post a cystoscopy/pyelogram 01/28/2020 confirming extrinsic compression of the right ureter, status post stent placement   Disposition: Ashley Moore appears stable.  She will continue Xeloda and radiation.  She seems to be tolerating the Xeloda well.  We reviewed the CBC from today.   Counts are stable,  adequate to continue as above.  She will return for lab and follow-up on the final day of radiation, 04/13/2020.    Ned Card ANP/GNP-BC   03/27/2020  3:04 PM

## 2020-03-28 ENCOUNTER — Ambulatory Visit: Payer: Medicare HMO

## 2020-03-29 ENCOUNTER — Encounter: Payer: Self-pay | Admitting: Oncology

## 2020-03-29 ENCOUNTER — Ambulatory Visit: Payer: Medicare HMO

## 2020-03-30 ENCOUNTER — Ambulatory Visit
Admission: RE | Admit: 2020-03-30 | Discharge: 2020-03-30 | Disposition: A | Discharge status: Home / Self Care | Payer: Medicare HMO | Source: Ambulatory Visit | Attending: Radiation Oncology | Admitting: Radiation Oncology

## 2020-03-30 DIAGNOSIS — I890 Lymphedema, not elsewhere classified: Secondary | ICD-10-CM | POA: Diagnosis not present

## 2020-03-30 DIAGNOSIS — N952 Postmenopausal atrophic vaginitis: Secondary | ICD-10-CM | POA: Diagnosis not present

## 2020-03-30 DIAGNOSIS — Z510 Encounter for antineoplastic radiation therapy: Secondary | ICD-10-CM | POA: Diagnosis not present

## 2020-03-30 DIAGNOSIS — C772 Secondary and unspecified malignant neoplasm of intra-abdominal lymph nodes: Secondary | ICD-10-CM | POA: Diagnosis not present

## 2020-03-30 DIAGNOSIS — C778 Secondary and unspecified malignant neoplasm of lymph nodes of multiple regions: Secondary | ICD-10-CM | POA: Diagnosis not present

## 2020-03-30 DIAGNOSIS — R110 Nausea: Secondary | ICD-10-CM | POA: Diagnosis not present

## 2020-03-30 DIAGNOSIS — F329 Major depressive disorder, single episode, unspecified: Secondary | ICD-10-CM | POA: Diagnosis not present

## 2020-03-30 DIAGNOSIS — C210 Malignant neoplasm of anus, unspecified: Secondary | ICD-10-CM | POA: Diagnosis not present

## 2020-03-31 ENCOUNTER — Other Ambulatory Visit: Payer: Self-pay

## 2020-03-31 ENCOUNTER — Ambulatory Visit
Admission: RE | Admit: 2020-03-31 | Discharge: 2020-03-31 | Disposition: A | Discharge status: Home / Self Care | Payer: Medicare HMO | Source: Ambulatory Visit | Attending: Radiation Oncology | Admitting: Radiation Oncology

## 2020-03-31 DIAGNOSIS — C210 Malignant neoplasm of anus, unspecified: Secondary | ICD-10-CM | POA: Diagnosis not present

## 2020-03-31 DIAGNOSIS — C772 Secondary and unspecified malignant neoplasm of intra-abdominal lymph nodes: Secondary | ICD-10-CM | POA: Diagnosis not present

## 2020-03-31 DIAGNOSIS — C778 Secondary and unspecified malignant neoplasm of lymph nodes of multiple regions: Secondary | ICD-10-CM | POA: Diagnosis not present

## 2020-03-31 DIAGNOSIS — N952 Postmenopausal atrophic vaginitis: Secondary | ICD-10-CM | POA: Diagnosis not present

## 2020-03-31 DIAGNOSIS — I890 Lymphedema, not elsewhere classified: Secondary | ICD-10-CM | POA: Diagnosis not present

## 2020-03-31 DIAGNOSIS — R110 Nausea: Secondary | ICD-10-CM | POA: Diagnosis not present

## 2020-03-31 DIAGNOSIS — F329 Major depressive disorder, single episode, unspecified: Secondary | ICD-10-CM | POA: Diagnosis not present

## 2020-03-31 DIAGNOSIS — Z510 Encounter for antineoplastic radiation therapy: Secondary | ICD-10-CM | POA: Diagnosis not present

## 2020-04-01 ENCOUNTER — Other Ambulatory Visit: Payer: Self-pay

## 2020-04-01 ENCOUNTER — Ambulatory Visit
Admission: RE | Admit: 2020-04-01 | Discharge: 2020-04-01 | Disposition: A | Discharge status: Home / Self Care | Payer: Medicare HMO | Source: Ambulatory Visit | Attending: Radiation Oncology | Admitting: Radiation Oncology

## 2020-04-01 DIAGNOSIS — Z510 Encounter for antineoplastic radiation therapy: Secondary | ICD-10-CM | POA: Diagnosis not present

## 2020-04-01 DIAGNOSIS — C778 Secondary and unspecified malignant neoplasm of lymph nodes of multiple regions: Secondary | ICD-10-CM | POA: Diagnosis not present

## 2020-04-01 DIAGNOSIS — F329 Major depressive disorder, single episode, unspecified: Secondary | ICD-10-CM | POA: Diagnosis not present

## 2020-04-01 DIAGNOSIS — C772 Secondary and unspecified malignant neoplasm of intra-abdominal lymph nodes: Secondary | ICD-10-CM | POA: Diagnosis not present

## 2020-04-01 DIAGNOSIS — C210 Malignant neoplasm of anus, unspecified: Secondary | ICD-10-CM | POA: Diagnosis not present

## 2020-04-01 DIAGNOSIS — R110 Nausea: Secondary | ICD-10-CM | POA: Diagnosis not present

## 2020-04-01 DIAGNOSIS — N952 Postmenopausal atrophic vaginitis: Secondary | ICD-10-CM | POA: Diagnosis not present

## 2020-04-01 DIAGNOSIS — I890 Lymphedema, not elsewhere classified: Secondary | ICD-10-CM | POA: Diagnosis not present

## 2020-04-02 ENCOUNTER — Ambulatory Visit
Admission: RE | Admit: 2020-04-02 | Discharge: 2020-04-02 | Disposition: A | Discharge status: Home / Self Care | Payer: Medicare HMO | Source: Ambulatory Visit | Attending: Radiation Oncology | Admitting: Radiation Oncology

## 2020-04-02 DIAGNOSIS — Z510 Encounter for antineoplastic radiation therapy: Secondary | ICD-10-CM | POA: Diagnosis not present

## 2020-04-02 DIAGNOSIS — F329 Major depressive disorder, single episode, unspecified: Secondary | ICD-10-CM | POA: Diagnosis not present

## 2020-04-02 DIAGNOSIS — C210 Malignant neoplasm of anus, unspecified: Secondary | ICD-10-CM | POA: Diagnosis not present

## 2020-04-02 DIAGNOSIS — C772 Secondary and unspecified malignant neoplasm of intra-abdominal lymph nodes: Secondary | ICD-10-CM | POA: Diagnosis not present

## 2020-04-02 DIAGNOSIS — R110 Nausea: Secondary | ICD-10-CM | POA: Diagnosis not present

## 2020-04-02 DIAGNOSIS — I890 Lymphedema, not elsewhere classified: Secondary | ICD-10-CM | POA: Diagnosis not present

## 2020-04-02 DIAGNOSIS — C778 Secondary and unspecified malignant neoplasm of lymph nodes of multiple regions: Secondary | ICD-10-CM | POA: Diagnosis not present

## 2020-04-02 DIAGNOSIS — N952 Postmenopausal atrophic vaginitis: Secondary | ICD-10-CM | POA: Diagnosis not present

## 2020-04-03 ENCOUNTER — Other Ambulatory Visit: Payer: Self-pay | Admitting: Radiation Oncology

## 2020-04-03 ENCOUNTER — Ambulatory Visit
Admission: RE | Admit: 2020-04-03 | Discharge: 2020-04-03 | Disposition: A | Discharge status: Home / Self Care | Payer: Medicare HMO | Source: Ambulatory Visit | Attending: Radiation Oncology | Admitting: Radiation Oncology

## 2020-04-03 DIAGNOSIS — R110 Nausea: Secondary | ICD-10-CM | POA: Diagnosis not present

## 2020-04-03 DIAGNOSIS — C210 Malignant neoplasm of anus, unspecified: Secondary | ICD-10-CM | POA: Diagnosis not present

## 2020-04-03 DIAGNOSIS — Z510 Encounter for antineoplastic radiation therapy: Secondary | ICD-10-CM | POA: Diagnosis not present

## 2020-04-03 DIAGNOSIS — F329 Major depressive disorder, single episode, unspecified: Secondary | ICD-10-CM | POA: Diagnosis not present

## 2020-04-03 DIAGNOSIS — I890 Lymphedema, not elsewhere classified: Secondary | ICD-10-CM | POA: Diagnosis not present

## 2020-04-03 DIAGNOSIS — N952 Postmenopausal atrophic vaginitis: Secondary | ICD-10-CM | POA: Diagnosis not present

## 2020-04-03 DIAGNOSIS — C772 Secondary and unspecified malignant neoplasm of intra-abdominal lymph nodes: Secondary | ICD-10-CM | POA: Diagnosis not present

## 2020-04-03 DIAGNOSIS — C778 Secondary and unspecified malignant neoplasm of lymph nodes of multiple regions: Secondary | ICD-10-CM | POA: Diagnosis not present

## 2020-04-03 MED ORDER — OXYCODONE HCL 10 MG PO TABS: 10.0000 mg | ORAL_TABLET | ORAL | 0 refills | Status: AC | PRN

## 2020-04-06 ENCOUNTER — Ambulatory Visit
Admission: RE | Admit: 2020-04-06 | Discharge: 2020-04-06 | Disposition: A | Discharge status: Home / Self Care | Payer: Medicare HMO | Source: Ambulatory Visit | Attending: Radiation Oncology | Admitting: Radiation Oncology

## 2020-04-06 ENCOUNTER — Other Ambulatory Visit: Payer: Self-pay

## 2020-04-06 DIAGNOSIS — Z510 Encounter for antineoplastic radiation therapy: Secondary | ICD-10-CM | POA: Diagnosis not present

## 2020-04-06 DIAGNOSIS — C778 Secondary and unspecified malignant neoplasm of lymph nodes of multiple regions: Secondary | ICD-10-CM | POA: Diagnosis not present

## 2020-04-06 DIAGNOSIS — I890 Lymphedema, not elsewhere classified: Secondary | ICD-10-CM | POA: Diagnosis not present

## 2020-04-06 DIAGNOSIS — C772 Secondary and unspecified malignant neoplasm of intra-abdominal lymph nodes: Secondary | ICD-10-CM | POA: Diagnosis not present

## 2020-04-06 DIAGNOSIS — C210 Malignant neoplasm of anus, unspecified: Secondary | ICD-10-CM | POA: Diagnosis not present

## 2020-04-06 DIAGNOSIS — F329 Major depressive disorder, single episode, unspecified: Secondary | ICD-10-CM | POA: Diagnosis not present

## 2020-04-06 DIAGNOSIS — N952 Postmenopausal atrophic vaginitis: Secondary | ICD-10-CM | POA: Diagnosis not present

## 2020-04-06 DIAGNOSIS — R110 Nausea: Secondary | ICD-10-CM | POA: Diagnosis not present

## 2020-04-07 ENCOUNTER — Ambulatory Visit: Payer: Medicare HMO

## 2020-04-07 ENCOUNTER — Other Ambulatory Visit: Payer: Self-pay

## 2020-04-07 ENCOUNTER — Ambulatory Visit
Admission: RE | Admit: 2020-04-07 | Discharge: 2020-04-07 | Disposition: A | Discharge status: Home / Self Care | Payer: Medicare HMO | Source: Ambulatory Visit | Attending: Radiation Oncology | Admitting: Radiation Oncology

## 2020-04-07 DIAGNOSIS — C778 Secondary and unspecified malignant neoplasm of lymph nodes of multiple regions: Secondary | ICD-10-CM | POA: Diagnosis not present

## 2020-04-07 DIAGNOSIS — C772 Secondary and unspecified malignant neoplasm of intra-abdominal lymph nodes: Secondary | ICD-10-CM | POA: Diagnosis not present

## 2020-04-07 DIAGNOSIS — N952 Postmenopausal atrophic vaginitis: Secondary | ICD-10-CM | POA: Diagnosis not present

## 2020-04-07 DIAGNOSIS — F329 Major depressive disorder, single episode, unspecified: Secondary | ICD-10-CM | POA: Diagnosis not present

## 2020-04-07 DIAGNOSIS — I890 Lymphedema, not elsewhere classified: Secondary | ICD-10-CM | POA: Diagnosis not present

## 2020-04-07 DIAGNOSIS — C210 Malignant neoplasm of anus, unspecified: Secondary | ICD-10-CM | POA: Diagnosis not present

## 2020-04-07 DIAGNOSIS — Z510 Encounter for antineoplastic radiation therapy: Secondary | ICD-10-CM | POA: Diagnosis not present

## 2020-04-07 DIAGNOSIS — R110 Nausea: Secondary | ICD-10-CM | POA: Diagnosis not present

## 2020-04-08 ENCOUNTER — Other Ambulatory Visit: Payer: Self-pay

## 2020-04-08 ENCOUNTER — Ambulatory Visit
Admission: RE | Admit: 2020-04-08 | Discharge: 2020-04-08 | Disposition: A | Discharge status: Home / Self Care | Payer: Medicare HMO | Source: Ambulatory Visit | Attending: Radiation Oncology | Admitting: Radiation Oncology

## 2020-04-08 ENCOUNTER — Telehealth: Payer: Self-pay | Admitting: Internal Medicine

## 2020-04-08 DIAGNOSIS — R110 Nausea: Secondary | ICD-10-CM | POA: Diagnosis not present

## 2020-04-08 DIAGNOSIS — F329 Major depressive disorder, single episode, unspecified: Secondary | ICD-10-CM | POA: Diagnosis not present

## 2020-04-08 DIAGNOSIS — C772 Secondary and unspecified malignant neoplasm of intra-abdominal lymph nodes: Secondary | ICD-10-CM | POA: Diagnosis not present

## 2020-04-08 DIAGNOSIS — C778 Secondary and unspecified malignant neoplasm of lymph nodes of multiple regions: Secondary | ICD-10-CM | POA: Diagnosis not present

## 2020-04-08 DIAGNOSIS — C210 Malignant neoplasm of anus, unspecified: Secondary | ICD-10-CM | POA: Diagnosis not present

## 2020-04-08 DIAGNOSIS — I890 Lymphedema, not elsewhere classified: Secondary | ICD-10-CM | POA: Diagnosis not present

## 2020-04-08 DIAGNOSIS — N952 Postmenopausal atrophic vaginitis: Secondary | ICD-10-CM | POA: Diagnosis not present

## 2020-04-08 DIAGNOSIS — Z510 Encounter for antineoplastic radiation therapy: Secondary | ICD-10-CM | POA: Diagnosis not present

## 2020-04-08 NOTE — Telephone Encounter (Signed)
Patient called needing a refill on her levothyroxine (SYNTHROID) 50 MCG tablet but the notes on the account states that she needs labs done before next refill. The patient only has 6 pills left and she is also on chemo and radiation. She wants to know if this will alter the lab results.  Please advise

## 2020-04-08 NOTE — Telephone Encounter (Signed)
Called patient and let her know that she has a refill left at the pharmacy and to please contact them for her refill. Patient also scheduled a lab appointment on 7/27/221. Patient verbalized an understanding.

## 2020-04-09 ENCOUNTER — Ambulatory Visit
Admission: RE | Admit: 2020-04-09 | Discharge: 2020-04-09 | Disposition: A | Discharge status: Home / Self Care | Payer: Medicare HMO | Source: Ambulatory Visit | Attending: Radiation Oncology | Admitting: Radiation Oncology

## 2020-04-09 ENCOUNTER — Encounter: Payer: Self-pay | Admitting: Rehabilitation

## 2020-04-09 ENCOUNTER — Ambulatory Visit: Payer: Medicare HMO | Admitting: Rehabilitation

## 2020-04-09 ENCOUNTER — Other Ambulatory Visit: Payer: Self-pay

## 2020-04-09 DIAGNOSIS — I890 Lymphedema, not elsewhere classified: Secondary | ICD-10-CM

## 2020-04-09 DIAGNOSIS — M6281 Muscle weakness (generalized): Secondary | ICD-10-CM

## 2020-04-09 DIAGNOSIS — Z483 Aftercare following surgery for neoplasm: Secondary | ICD-10-CM | POA: Diagnosis not present

## 2020-04-09 DIAGNOSIS — Z510 Encounter for antineoplastic radiation therapy: Secondary | ICD-10-CM | POA: Diagnosis not present

## 2020-04-09 DIAGNOSIS — L599 Disorder of the skin and subcutaneous tissue related to radiation, unspecified: Secondary | ICD-10-CM

## 2020-04-09 DIAGNOSIS — C210 Malignant neoplasm of anus, unspecified: Secondary | ICD-10-CM | POA: Diagnosis not present

## 2020-04-09 DIAGNOSIS — R279 Unspecified lack of coordination: Secondary | ICD-10-CM

## 2020-04-09 DIAGNOSIS — C778 Secondary and unspecified malignant neoplasm of lymph nodes of multiple regions: Secondary | ICD-10-CM | POA: Diagnosis not present

## 2020-04-09 DIAGNOSIS — R252 Cramp and spasm: Secondary | ICD-10-CM

## 2020-04-09 DIAGNOSIS — F329 Major depressive disorder, single episode, unspecified: Secondary | ICD-10-CM | POA: Diagnosis not present

## 2020-04-09 DIAGNOSIS — R110 Nausea: Secondary | ICD-10-CM | POA: Diagnosis not present

## 2020-04-09 DIAGNOSIS — N952 Postmenopausal atrophic vaginitis: Secondary | ICD-10-CM | POA: Diagnosis not present

## 2020-04-09 DIAGNOSIS — C772 Secondary and unspecified malignant neoplasm of intra-abdominal lymph nodes: Secondary | ICD-10-CM | POA: Diagnosis not present

## 2020-04-09 NOTE — Therapy (Signed)
[place], [place], [address] Phone: [phone]   Fax:  [phone]  Physical Therapy Treatment  Patient Details  Name: Ashley Moore MRN: [id number] Date of Birth: October 02, 1950 Referring Provider (PT): Shona Simpson PA-C   Encounter Date: 04/09/2020   PT End of Session - 04/09/20 1446    Visit Number 7    Number of Visits 12    Date for PT Re-Evaluation 05/21/20    Authorization Type [employer]    Authorization - Visit Number 7    Authorization - Number of Visits 12    PT Start Time 0300    PT Stop Time 1450    PT Time Calculation (min) 48 min    Activity Tolerance Patient tolerated treatment well    Behavior During Therapy [facility] for tasks assessed/performed           Past Medical History:  Diagnosis Date  . Bowen's disease    excised 1992  . Family history of breast cancer   . Family history of lung cancer   . Family history of non-Hodgkin's lymphoma   . Family history of ovarian cancer   . Headache   . Hx of varicella   . 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([place])   . Thyroid cancer [facility])     Past Surgical History:  Procedure Laterality Date  . BUNIONECTOMY Right   . CYSTOSCOPY WITH RETROGRADE PYELOGRAM, URETEROSCOPY AND STENT PLACEMENT Right 01/28/2020   Procedure: CYSTOSCOPY WITH RETROGRADE PYELOGRAM, URETEROSCOPY AND STENT PLACEMENT;  Surgeon: Alexis Frock, MD;  Location: [facility];  Service: Urology;  Laterality: Right;  . IR GENERIC HISTORICAL  11/14/2016   IR US GUIDE VASC ACCESS RIGHT 11/14/2016 [facility]  . IR GENERIC HISTORICAL  11/14/2016   IR FLUORO GUIDE CV LINE RIGHT 11/14/2016 [facility]  . IR GENERIC HISTORICAL  12/12/2016   IR US GUIDE VASC ACCESS RIGHT 12/12/2016 [name], MD [facility]  . IR GENERIC HISTORICAL  12/12/2016   IR FLUORO GUIDE CV LINE RIGHT 12/12/2016 [name], MD [facility]  . SKIN CANCER EXCISION     bowens disease    There were no vitals filed for this  visit.   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 Pt initially diagnosed in 2018 with history of Chemotherapy and radiation.  Rt nephroureteral stent in 2021 due to hydronephrosis now decompressed.  Radiation will be performed again most likely with concurrent chemotherapy    Patient Stated Goals I'm not sure but I am going to be doing radiation again    Currently in Pain? Yes    Pain Score 2     Pain Location Flank    Pain Orientation Right;Left;Anterior    Pain Descriptors / Indicators Aching;Sharp    Pain Type Surgical pain    Pain Onset More than a month ago    Pain Frequency Constant                 LYMPHEDEMA/ONCOLOGY QUESTIONNAIRE - 04/09/20 0001      Right Lower Extremity Lymphedema   20 cm Proximal to Floor at Lateral Plantar Foot 34.4 1      Left Lower Extremity Lymphedema   20 cm Proximal to Floor at Lateral Plantar Foot 34.2 cm  [facility] Adult PT Treatment/Exercise - 04/09/20 0001      Manual Therapy   Edema Management less fibrotic edema at the ankles bilaterally today with less pitting.      Manual Lymphatic Drainage (MLD) in supine HOB elevated bil axillary nodes, bil inguino-axillary anastamosis, one leg at at time lateral thigh, medial to lateral thigh, lateral thigh, bottle neck of the knee, posterior knee, all surfaces of the lower leg, bil ankle, dorsum of the foot, re-worked all surfaces from foot to axilla 3x.                       PT Long Term Goals - 02/19/20 1710      PT LONG TERM GOAL #1   Title Pt will be ind with self care for bilateral LE lymphedema to include use of compression garments and MLD and/or compression pump    Time 6    Period Weeks    Status New      PT LONG TERM GOAL #2   Title Pt will report decreased edema in the ankle after  working by at least 50% to demonstrate improved lymphatic flow    Time 6    Period Weeks    Status New                 Plan - 04/09/20 1450    Clinical Impression Statement Pt arrives today starting to feel more pain at the end of trunk radiation but overall happy that her lymphedema has not progressed.  Pt has improved LE status today bilaterally with less pitting edema around the malleoli.  Quick measure of the mid lower leg showing no increases and improved appearance of the foot and ankles.  Pt will return in 3.5 weeks post radiation    PT Duration 6 weeks    PT Treatment/Interventions ADLs/Self Care Home Management;Therapeutic exercise;Patient/family education;Manual lymph drainage;Manual techniques;Passive range of motion    PT Next Visit Plan bil leg MLD using axillary nodes, keep monitoring compression needs but pt seems happy with current, any more visits? DC visit?           Patient will benefit from skilled therapeutic intervention in order to improve the following deficits and impairments:     Visit Diagnosis: Disorder of the skin and subcutaneous tissue related to radiation, unspecified  Aftercare following surgery for neoplasm  Muscle weakness (generalized)  Cramp and spasm  Unspecified lack of coordination  Lymphedema     Problem List Patient Active Problem List   Diagnosis Date Noted  . 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 Genetic testing 01/27/2020  . Family history of breast cancer   . Family history of lung cancer   . Family history of ovarian cancer   . Family history of non-Hodgkin's lymphoma   . 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 Port catheter in place 11/21/2016  . 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 Anal cancer ([place]) 11/03/2016  . Cystitis 05/06/2015  . Degenerative cervical disc 04/08/2015  . Trigger point of neck 03/26/2015  . Pain in joint, ankle and foot 04/23/2013  . Visit for  preventive health examination 02/13/2013  . Routine gynecological examination 02/13/2013  . Family history of breast cancer in first degree relative 02/13/2013  . Rash and nonspecific skin eruption 02/13/2013  . 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 VITAMIN D DEFICIENCY 06/25/2008  . NECK PAIN 06/02/2008  . FOOT PAIN 06/02/2008  . OSTEOPENIA 06/02/2008  Stark Bray 04/09/2020, 4:06 PM  [place], [place], [address] Phone: [phone]   Fax:  [phone]  Name: Ashley Moore MRN: [id number] Date of Birth: Jun 27, 1951

## 2020-04-10 ENCOUNTER — Other Ambulatory Visit: Payer: Self-pay

## 2020-04-10 ENCOUNTER — Ambulatory Visit
Admission: RE | Admit: 2020-04-10 | Discharge: 2020-04-10 | Disposition: A | Discharge status: Home / Self Care | Payer: Medicare HMO | Source: Ambulatory Visit | Attending: Radiation Oncology | Admitting: Radiation Oncology

## 2020-04-10 DIAGNOSIS — Z510 Encounter for antineoplastic radiation therapy: Secondary | ICD-10-CM | POA: Diagnosis not present

## 2020-04-10 DIAGNOSIS — C210 Malignant neoplasm of anus, unspecified: Secondary | ICD-10-CM | POA: Diagnosis not present

## 2020-04-10 DIAGNOSIS — I890 Lymphedema, not elsewhere classified: Secondary | ICD-10-CM | POA: Diagnosis not present

## 2020-04-10 DIAGNOSIS — N952 Postmenopausal atrophic vaginitis: Secondary | ICD-10-CM | POA: Diagnosis not present

## 2020-04-10 DIAGNOSIS — C778 Secondary and unspecified malignant neoplasm of lymph nodes of multiple regions: Secondary | ICD-10-CM | POA: Diagnosis not present

## 2020-04-10 DIAGNOSIS — R110 Nausea: Secondary | ICD-10-CM | POA: Diagnosis not present

## 2020-04-10 DIAGNOSIS — C772 Secondary and unspecified malignant neoplasm of intra-abdominal lymph nodes: Secondary | ICD-10-CM | POA: Diagnosis not present

## 2020-04-10 DIAGNOSIS — F329 Major depressive disorder, single episode, unspecified: Secondary | ICD-10-CM | POA: Diagnosis not present

## 2020-04-13 ENCOUNTER — Encounter: Payer: Self-pay | Admitting: Radiation Oncology

## 2020-04-13 ENCOUNTER — Ambulatory Visit
Admission: RE | Admit: 2020-04-13 | Discharge: 2020-04-13 | Disposition: A | Discharge status: Home / Self Care | Payer: Medicare HMO | Source: Ambulatory Visit | Attending: Radiation Oncology | Admitting: Radiation Oncology

## 2020-04-13 ENCOUNTER — Inpatient Hospital Stay: Payer: Medicare HMO

## 2020-04-13 ENCOUNTER — Other Ambulatory Visit: Payer: Self-pay

## 2020-04-13 ENCOUNTER — Encounter: Payer: Self-pay | Admitting: Nurse Practitioner

## 2020-04-13 ENCOUNTER — Inpatient Hospital Stay: Payer: Medicare HMO | Admitting: Nurse Practitioner

## 2020-04-13 VITALS — BP 128/79 | HR 98 | Temp 97.3°F | Resp 18 | Wt 182.3 lb

## 2020-04-13 DIAGNOSIS — C772 Secondary and unspecified malignant neoplasm of intra-abdominal lymph nodes: Secondary | ICD-10-CM | POA: Diagnosis not present

## 2020-04-13 DIAGNOSIS — Z510 Encounter for antineoplastic radiation therapy: Secondary | ICD-10-CM | POA: Diagnosis not present

## 2020-04-13 DIAGNOSIS — C778 Secondary and unspecified malignant neoplasm of lymph nodes of multiple regions: Secondary | ICD-10-CM | POA: Diagnosis not present

## 2020-04-13 DIAGNOSIS — R110 Nausea: Secondary | ICD-10-CM | POA: Diagnosis not present

## 2020-04-13 DIAGNOSIS — Z803 Family history of malignant neoplasm of breast: Secondary | ICD-10-CM | POA: Diagnosis not present

## 2020-04-13 DIAGNOSIS — I890 Lymphedema, not elsewhere classified: Secondary | ICD-10-CM | POA: Diagnosis not present

## 2020-04-13 DIAGNOSIS — C210 Malignant neoplasm of anus, unspecified: Secondary | ICD-10-CM | POA: Diagnosis not present

## 2020-04-13 DIAGNOSIS — N1330 Unspecified hydronephrosis: Secondary | ICD-10-CM | POA: Diagnosis not present

## 2020-04-13 DIAGNOSIS — F329 Major depressive disorder, single episode, unspecified: Secondary | ICD-10-CM | POA: Diagnosis not present

## 2020-04-13 DIAGNOSIS — N952 Postmenopausal atrophic vaginitis: Secondary | ICD-10-CM | POA: Diagnosis not present

## 2020-04-13 LAB — CBC WITH DIFFERENTIAL (CANCER CENTER ONLY)
Abs Immature Granulocytes: 0.01 10*3/uL (ref 0.00–0.07)
Basophils Absolute: 0 10*3/uL (ref 0.0–0.1)
Basophils Relative: 0 %
Eosinophils Absolute: 0.1 10*3/uL (ref 0.0–0.5)
Eosinophils Relative: 2 %
HCT: 36.6 % (ref 36.0–46.0)
Hemoglobin: 12.1 g/dL (ref 12.0–15.0)
Immature Granulocytes: 0 %
Lymphocytes Relative: 3 %
Lymphs Abs: 0.1 10*3/uL — ABNORMAL LOW (ref 0.7–4.0)
MCH: 30.1 pg (ref 26.0–34.0)
MCHC: 33.1 g/dL (ref 30.0–36.0)
MCV: 91 fL (ref 80.0–100.0)
Monocytes Absolute: 0.4 10*3/uL (ref 0.1–1.0)
Monocytes Relative: 9 %
Neutro Abs: 4.1 10*3/uL (ref 1.7–7.7)
Neutrophils Relative %: 86 %
Platelet Count: 165 10*3/uL (ref 150–400)
RBC: 4.02 MIL/uL (ref 3.87–5.11)
RDW: 18.1 % — ABNORMAL HIGH (ref 11.5–15.5)
WBC Count: 4.9 10*3/uL (ref 4.0–10.5)
nRBC: 0 % (ref 0.0–0.2)

## 2020-04-13 NOTE — Progress Notes (Addendum)
[place] OFFICE PROGRESS NOTE   Diagnosis: Anal cancer  INTERVAL HISTORY:   Ashley Moore returns as scheduled.  She completes the course of radiation/Xeloda today.  No nausea/vomiting.  No mouth sores.  No diarrhea.  No hand or foot pain or redness.  She continues to have pain along the right flank region.  Objective:  Vital signs in last 24 hours:  Blood pressure 128/79, pulse 98, temperature (!) 97.3 F (36.3 C), temperature source Temporal, resp. rate 18, weight 182 lb 4.8 oz (82.7 kg), SpO2 100 %.    HEENT: No thrush or ulcers. Resp: Lungs clear bilaterally. Cardio: Regular rate and rhythm. GI: Abdomen soft, mild generalized tenderness.  No hepatomegaly. Vascular: No leg edema.  Skin: Palms without erythema.   Lab Results:  Lab Results  Component Value Date   WBC 4.9 04/13/2020   HGB 12.1 04/13/2020   HCT 36.6 04/13/2020   MCV 91.0 04/13/2020   PLT 165 04/13/2020   NEUTROABS 4.1 04/13/2020    Imaging:  No results found.  Medications: I have reviewed the patient's current medications.  Assessment/Plan: 1. Anal cancer ? CT abdomen/pelvis 10/21/2016-thickening of the anus extending to the junction between the anus and rectum with a large stool ball in the rectum and mild fat stranding posterior to the rectum. Enlarged lymph nodes in the right and left inguinal regions. A few mildly prominent nodesseen posterior to the rectum. ? 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 ? PET scan 16/06/9603-[id number] hypermetabolic anal mass with hypermetabolic metastases to the groin region bilaterally, left pelvic sidewall and presacral space. ? Initiation of radiation and cycle 1 5-FU/mitomycin C 11/14/2016 ? Cycle 2 5-FU/mitomycin C 12/12/2016 (5-FU dose reduced due to mucositis, diarrhea, skin breakdown) ? Radiation completed 12/23/2016 ? CT abdomen/pelvis 04/14/2017-resolution of anal mass and bilateral inguinal lymphadenopathy. No  residual tumor seen. ? CT abdomen/pelvis 01/28/2020-right hydroureteronephrosis, transition at the level of the mid right ureter, retroperitoneal lymphadenopathy ? CT renal stone study 01/28/2020-severe right hydronephrosis and proximal right hydroureter, 8 mm area of increased attenuation in the proximal right ureter-stone versus soft tissue mass ? PET 02/13/2020-hypermetabolic right retroperitoneal nodal metastases in the aortocaval and posterior pericaval chains, nonspecific mild anal wall hypermetabolism, right nephroureteral stent ? 02/21/2020 anorectal exam per Dr. Ashley Moore radiation changes of the skin around the anal margin. Posterior midline smooth scarring. Mildly stenotic. Stenosis seems better. No palpable concerning lesions. Entire anal canal and distal rectum feels smooth and healthy. Partial anoscopy performed with no lesions in the anal canal. ? Xeloda/radiation beginning 03/02/2020 2. Left labial lesions. Question direct extension from the anal cancer versus metastatic disease from anal cancer versus a separate malignant process. 3. History of pain and bleeding secondary to #1 and skin breakdown 4. 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 5. Multiple family members with breast cancer. 6. History of hypokalemia-likely secondary to decreased nutritional intake and diarrhea; potassium in normal range 01/16/2017. No longer taking a potassium supplement. 7. Right hydroureteronephrosis on CT 01/28/2020, status post a cystoscopy/pyelogram 01/28/2020 confirming extrinsic compression of the right ureter, status post stent placement   Disposition: Ashley Moore appears stable.  She completes the course of radiation/Xeloda today.  Overall she has tolerated the Xeloda portion of her treatment well.  Ashley Moore recommends restaging CT scans in approximately 2 months.  CBC from today reviewed.  She has a ureter stent.  She is having flank pain.   We made a referral to Dr. Tresa Moore.  She will return for lab and CTs in 2 months, follow-up appointment a few days later to review the results.  Patient seen with Ashley Moore.  Ned Card ANP/GNP-BC   04/13/2020  2:30 PM  This was a shared visit with Ned Card.  Ashley Moore will complete the course of Xeloda and radiation today.  She will be scheduled for restaging CTs and an office visit in 2 months.  Ashley Manson, MD

## 2020-04-14 ENCOUNTER — Other Ambulatory Visit: Payer: Medicare HMO

## 2020-04-15 ENCOUNTER — Telehealth: Payer: Self-pay | Admitting: Nurse Practitioner

## 2020-04-15 NOTE — Telephone Encounter (Signed)
Per 7/26 los, completed referral to Urology via [facility]

## 2020-04-15 NOTE — Telephone Encounter (Signed)
Scheduled per 7/26 los. Called and spoke with pt, confirmed 9/29 and 9/30 appts

## 2020-04-17 ENCOUNTER — Other Ambulatory Visit: Payer: Self-pay

## 2020-04-17 ENCOUNTER — Other Ambulatory Visit: Payer: Medicare HMO

## 2020-04-17 DIAGNOSIS — R7989 Other specified abnormal findings of blood chemistry: Secondary | ICD-10-CM | POA: Diagnosis not present

## 2020-04-18 LAB — T4, FREE: Free T4: 1.3 ng/dL (ref 0.8–1.8)

## 2020-04-18 LAB — TSH: TSH: 2.84 mIU/L (ref 0.40–4.50)

## 2020-04-19 NOTE — Progress Notes (Signed)
Thyroid is now in range  Continue same medication    Recheck lab in a year  Can be refilled for a year

## 2020-04-23 DIAGNOSIS — H524 Presbyopia: Secondary | ICD-10-CM | POA: Diagnosis not present

## 2020-04-23 DIAGNOSIS — H04123 Dry eye syndrome of bilateral lacrimal glands: Secondary | ICD-10-CM | POA: Diagnosis not present

## 2020-04-23 DIAGNOSIS — H2513 Age-related nuclear cataract, bilateral: Secondary | ICD-10-CM | POA: Diagnosis not present

## 2020-04-23 DIAGNOSIS — H25013 Cortical age-related cataract, bilateral: Secondary | ICD-10-CM | POA: Diagnosis not present

## 2020-05-04 NOTE — Progress Notes (Signed)
  Radiation Oncology         [phone]) [phone] ________________________________  Name: Ashley Moore MRN: [id number]  Date: 04/13/2020  DOB: 1950-10-05  End of Treatment Note  Diagnosis:   Anal cancer     Indication for treatment::  palliative       Radiation treatment dates:   03/02/20 - 04/13/20  Site/dose:   The retroperitoneal lymph node region within the abdomen was treated to a dose of 54 Gy in 30 fractions using a 2-field IMRT technique.  Narrative: The patient tolerated radiation treatment relatively well.     Plan: The patient has completed radiation treatment. The patient will return to radiation oncology clinic for routine followup in one month. I advised the patient to call or return sooner if they have any questions or concerns related to their recovery or treatment. ________________________________  Jodelle Gross, M.D., Ph.D.

## 2020-05-05 ENCOUNTER — Other Ambulatory Visit: Payer: Self-pay

## 2020-05-05 ENCOUNTER — Ambulatory Visit: Payer: Medicare HMO | Attending: Radiation Oncology | Admitting: Rehabilitation

## 2020-05-05 ENCOUNTER — Encounter: Payer: Self-pay | Admitting: Rehabilitation

## 2020-05-05 DIAGNOSIS — I890 Lymphedema, not elsewhere classified: Secondary | ICD-10-CM | POA: Insufficient documentation

## 2020-05-05 NOTE — Therapy (Signed)
[place], [place], [address] Phone: [phone]   Fax:  [phone]  Physical Therapy Treatment  Patient Details  Name: Ashley Moore MRN: [id number] Date of Birth: 1951/06/22 Referring Provider (PT): Shona Simpson PA-C   Encounter Date: 05/05/2020   PT End of Session - 05/05/20 1653    Visit Number 8    Number of Visits 12    Date for PT Re-Evaluation 05/21/20    PT Start Time 1601    PT Stop Time 1654    PT Time Calculation (min) 53 min    Activity Tolerance Patient tolerated treatment well    Behavior During Therapy [facility] for tasks assessed/performed           Past Medical History:  Diagnosis Date  . Bowen's disease    excised 1992  . Family history of breast cancer   . Family history of lung cancer   . Family history of non-Hodgkin's lymphoma   . Family history of ovarian cancer   . Headache   . Hx of varicella   . 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([place])   . Thyroid cancer [facility])     Past Surgical History:  Procedure Laterality Date  . BUNIONECTOMY Right   . CYSTOSCOPY WITH RETROGRADE PYELOGRAM, URETEROSCOPY AND STENT PLACEMENT Right 01/28/2020   Procedure: CYSTOSCOPY WITH RETROGRADE PYELOGRAM, URETEROSCOPY AND STENT PLACEMENT;  Surgeon: Alexis Frock, MD;  Location[facility];  Service: Urology;  Laterality: Right;  . IR GENERIC HISTORICAL  11/14/2016   IR US GUIDE VASC ACCESS RIGHT 11/14/2016 [facility]  . IR GENERIC HISTORICAL  11/14/2016   IR FLUORO GUIDE CV LINE RIGHT 11/14/2016 [facility]  . IR GENERIC HISTORICAL  12/12/2016   IR US GUIDE VASC ACCESS RIGHT 12/12/2016 [name], MD [facility]  . IR GENERIC HISTORICAL  12/12/2016   IR FLUORO GUIDE CV LINE RIGHT 12/12/2016 [name], MD [facility]  . SKIN CANCER EXCISION     bowens disease    There were no vitals filed for this visit.   Subjective Assessment - 05/05/20 1604    Subjective i got some new shoes.  I am need an orthotic.   I get my stent out on Thursday.    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 Pt initially diagnosed in 2018 with history of Chemotherapy and radiation.  Rt nephroureteral stent in 2021 due to hydronephrosis now decompressed.  Radiation will be performed again most likely with concurrent chemotherapy    Patient Stated Goals I'm not sure but I am going to be doing radiation again    Currently in Pain? Yes    Pain Score 2     Pain Location Back    Pain Orientation Lower    Pain Descriptors / Indicators Aching;Burning    Pain Type Surgical pain    Pain Onset More than a month ago    Pain Frequency Constant                             [facility] Adult PT Treatment/Exercise - 05/05/20 0001      Manual Therapy   Manual Lymphatic Drainage (MLD) in supine HOB elevated bil axillary nodes, bil inguino-axillary anastamosis, one leg at at time lateral thigh, medial to lateral thigh, lateral thigh, bottle neck of the knee, posterior knee, all surfaces of the lower leg, bil ankle, dorsum of the foot, re-worked all  surfaces from foot to axilla 3x.                       PT Long Term Goals - 05/05/20 1609      PT LONG TERM GOAL #1   Title Pt will be ind with self care for bilateral LE lymphedema to include use of compression garments and MLD and/or compression pump    Status Achieved      PT LONG TERM GOAL #2   Title Pt will report decreased edema in the ankle after working by at least 50% to demonstrate improved lymphatic flow    Baseline not bad after work    Status Achieved                 Plan - 05/05/20 1654    Clinical Impression Statement Pt has completed radiation and chemotherapy and is doing well.  Has returned to work full time x 2 days and reports no overall increase in swelling except at the ankles where it is normally stubborn.  Pt is ready for DC and know she can return at any time.           Patient will  benefit from skilled therapeutic intervention in order to improve the following deficits and impairments:     Visit Diagnosis: Lymphedema, not elsewhere classified     Problem List Patient Active Problem List   Diagnosis Date Noted  . 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 Genetic testing 01/27/2020  . Family history of breast cancer   . Family history of lung cancer   . Family history of ovarian cancer   . Family history of non-Hodgkin's lymphoma   . 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 Port catheter in place 11/21/2016  . 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 Anal cancer ([place]) 11/03/2016  . Cystitis 05/06/2015  . Degenerative cervical disc 04/08/2015  . Trigger point of neck 03/26/2015  . Pain in joint, ankle and foot 04/23/2013  . Visit for preventive health examination 02/13/2013  . Routine gynecological examination 02/13/2013  . Family history of breast cancer in first degree relative 02/13/2013  . Rash and nonspecific skin eruption 02/13/2013  . 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 VITAMIN D DEFICIENCY 06/25/2008  . NECK PAIN 06/02/2008  . FOOT PAIN 06/02/2008  . OSTEOPENIA 06/02/2008    Stark Bray 05/05/2020, 5:07 PM  [place], [place], [address] Phone: [phone]   Fax:  [phone]  Name: Ashley Moore MRN: [id number] Date of Birth: [birth year]  PHYSICAL THERAPY DISCHARGE SUMMARY  Visits from Start of Care: 8  Current functional level related to goals / functional outcomes: See above   Remaining deficits: Chronic LE swelling   Education / Equipment: Has compression socks and is ind with self MLD and elevation Plan: Patient agrees to discharge.  Patient goals were met. Patient is being discharged due to being pleased with the current functional level.  ?????    Shan Levans, PT

## 2020-05-06 ENCOUNTER — Telehealth: Payer: Self-pay | Admitting: Internal Medicine

## 2020-05-06 NOTE — Telephone Encounter (Signed)
Called patient and LMOVM to return call  Left a detailed voice message for patient to call back to discuss message from Dr. Regis Bill.

## 2020-05-06 NOTE — Telephone Encounter (Signed)
Refer to ortho pedics   And podiatry

## 2020-05-06 NOTE — Telephone Encounter (Signed)
Pt called to say she thinks she needs the 3rd booster shot vaccine. She has finished chemo and wants to know if she qualifies for it.  Also she wants to see a Dr for orthotics and her feet.  Please advise

## 2020-05-06 NOTE — Telephone Encounter (Signed)
Please see message.  Please advise. 

## 2020-05-06 NOTE — Telephone Encounter (Signed)
If she was on chemo around the time of immunization or ongoing  She may want to get the booster shot. She can ask her oncologist also .  Not enough info about the orthotics  And feet  ie dx   but  Sounds like she may want to see a podiatrist    She can make own appts   If she wishes .

## 2020-05-06 NOTE — Telephone Encounter (Signed)
Called patient and she is requesting a referral for podiatry for one foot is wide and the other foot has no arch.  Also wants a referral to someone about her right knee because it is crooked from a fall on concrete about 10 years ago and she has a hard time with it.  Patient did not have a good cell phone signal and was hard to understand but she is requesting a referral for both.  Please advise.

## 2020-05-07 ENCOUNTER — Other Ambulatory Visit: Payer: Self-pay

## 2020-05-07 DIAGNOSIS — M25561 Pain in right knee: Secondary | ICD-10-CM

## 2020-05-07 DIAGNOSIS — N130 Hydronephrosis with ureteropelvic junction obstruction: Secondary | ICD-10-CM | POA: Diagnosis not present

## 2020-05-07 NOTE — Telephone Encounter (Signed)
Called patient and LMOVM to return call  Left a detailed voice message to let patient know that we are referring to ortho for her right knee.

## 2020-05-11 ENCOUNTER — Telehealth: Payer: Self-pay | Admitting: Radiation Oncology

## 2020-05-11 ENCOUNTER — Other Ambulatory Visit: Payer: Self-pay | Admitting: Urology

## 2020-05-11 NOTE — Telephone Encounter (Signed)
  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  Interval Since Last Radiation:  4 weeks   03/02/20 - 04/13/20: The retroperitoneal lymph node region within the abdomen was treated to a dose of 54 Gy in 30 fractions using a 2-field IMRT technique.  11/14/16-12/23/16: 54 Gy to the pelvic nodes, anus, and bilateral groin nodes.  Narrative:  The patient was contacted today for routine follow-up. During treatment she did very well with radiotherapy and did not have significant desquamation. She reports she is doing well. She denies any abdominal pain any longer and reports she has seen Dr. Tresa Moore and he was pleased with how she's been doing. No other complaints are noted.  Impression/Plan: 1. 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 The patient has been doing well since completion of radiotherapy. She feels that she is healing up nicely since her treatment and she will follow up with Dr. Tresa Moore in urology, hopefully to have her stent out in the next month or so. We discussed that we would be happy to continue to follow her as needed, but she will also continue to follow up with Dr. Learta Codding in medical oncology.     Carola Rhine, PAC

## 2020-05-14 ENCOUNTER — Telehealth: Payer: Self-pay | Admitting: Internal Medicine

## 2020-05-14 ENCOUNTER — Ambulatory Visit: Payer: Medicare HMO | Admitting: Physician Assistant

## 2020-05-14 ENCOUNTER — Ambulatory Visit (INDEPENDENT_AMBULATORY_CARE_PROVIDER_SITE_OTHER): Payer: Medicare HMO

## 2020-05-14 ENCOUNTER — Encounter: Payer: Self-pay | Admitting: Physician Assistant

## 2020-05-14 DIAGNOSIS — G8929 Other chronic pain: Secondary | ICD-10-CM

## 2020-05-14 DIAGNOSIS — M25561 Pain in right knee: Secondary | ICD-10-CM | POA: Diagnosis not present

## 2020-05-14 NOTE — Telephone Encounter (Signed)
clarify  She has a  have her oncology team or gi team     Call her and clarify    Do we need to do a gi referral?    She has a dx of  Anal cancer . Marland Kitchen

## 2020-05-14 NOTE — Telephone Encounter (Signed)
Please see message. °

## 2020-05-14 NOTE — Telephone Encounter (Signed)
Pt is calling to get a referral to have a colonoscopy done.  Please advise

## 2020-05-14 NOTE — Progress Notes (Signed)
Office Visit Note   Patient: Ashley Moore           Date of Birth: 09/21/1950           MRN: [id number] Visit Date: 05/14/2020              Requested by: [name], MD [address] [place],  [place] [address] PCP: [name], MD  Chief Complaint  Patient presents with  . Right Knee - Pain      HPI: The patient is a pleasant 69 year old woman who comes in today to discuss her right knee.  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  She used to take some anti-inflammatories for her right knee but stopped this.  She has had the return of some pain actually on the medial side of the knee no injury.  She also has a request regarding orthotics she is quite flat-footed.  She also would like to get some type of splint with regards to her left index finger.  It has been contracting at the end of the finger for about a year now.  It is her dominant hand and she does get some pain from this  Assessment & Plan: Visit Diagnoses:  1. Chronic pain of right knee     Plan: A splint was given to her with some Coban to keep her PIP and distal phalanx in a straighter position she did say it relieved her pain.  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  She asked about a brace to correct this and I told her that she could get an unloader brace if she had significant pain but this would not correct her valgus alignment.  Since she is not very painful she would like to try getting back on her meloxicam and see if that helps her.  She will follow-up in 4 weeks.  I did give her a suggestion of sole cork orthotics  Follow-Up Instructions: No follow-ups on file.   Ortho Exam  Patient is alert, oriented, no adenopathy, well-dressed, normal affect, normal respiratory effort. Right knee: No erythema no effusion she does have valgus malalignment.  She does not have a lot of pain on the lateral side of the knee more on the medial  side of the knee and patellofemoral.  Left index finger she has malleting at the PIP joint.  There is no surrounding cellulitis it is a flexible correction  Imaging: No results found. No images are attached to the encounter.  Labs: Lab Results  Component Value Date   HGBA1C 5.5 05/11/2015   REPTSTATUS 01/28/2020 FINAL 01/28/2020   CULT MULTIPLE SPECIES PRESENT, SUGGEST RECOLLECTION (A) 01/28/2020     Lab Results  Component Value Date   ALBUMIN 3.5 03/27/2020   ALBUMIN 3.5 03/13/2020   ALBUMIN 3.7 03/02/2020    No results found for: MG Lab Results  Component Value Date   VD25OH 48 08/10/2010   VD25OH 46 11/11/2008    No results found for: PREALBUMIN CBC EXTENDED Latest Ref Rng & Units 04/13/2020 03/27/2020 03/13/2020  WBC 4.0 - 10.5 K/uL 4.9 3.3(L) 3.3(L)  RBC 3.87 - 5.11 MIL/uL 4.02 4.13 4.46  HGB 12.0 - 15.0 g/dL 12.1 12.0 12.7  HCT 36 - 46 % 36.6 36.7 39.5  PLT 150 - 400 K/uL 165 161 232  NEUTROABS 1.7 - 7.7 K/uL 4.1 2.4 2.4  LYMPHSABS 0.7 - 4.0 K/uL 0.1(L) 0.3(L) 0.4(L)  There is no height or weight on file to calculate BMI.  Orders:  Orders Placed This Encounter  Procedures  . XR KNEE 3 VIEW RIGHT   No orders of the defined types were placed in this encounter.    Procedures: No procedures performed  Clinical Data: No additional findings.  ROS:  All other systems negative, except as noted in the HPI. Review of Systems  Objective: Vital Signs: There were no vitals taken for this visit.  Specialty Comments:  No specialty comments available.  PMFS History: Patient Active Problem List   Diagnosis Date Noted  . 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 Genetic testing 01/27/2020  . Family history of breast cancer   . Family history of lung cancer   . Family history of ovarian cancer   . Family history of non-Hodgkin's lymphoma   . 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 Port catheter in place 11/21/2016  . 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 Anal cancer ([place]) 11/03/2016  . Cystitis 05/06/2015  . Degenerative cervical disc 04/08/2015  . Trigger point of neck 03/26/2015  . Pain in joint, ankle and foot 04/23/2013  . Visit for preventive health examination 02/13/2013  . Routine gynecological examination 02/13/2013  . Family history of breast cancer in first degree relative 02/13/2013  . Rash and nonspecific skin eruption 02/13/2013  . 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 VITAMIN D DEFICIENCY 06/25/2008  . NECK PAIN 06/02/2008  . FOOT PAIN 06/02/2008  . OSTEOPENIA 06/02/2008   Past Medical History:  Diagnosis Date  . Bowen's disease    excised 1992  . Family history of breast cancer   . Family history of lung cancer   . Family history of non-Hodgkin's lymphoma   . Family history of ovarian cancer   . Headache   . Hx of varicella   . 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([place])   . Thyroid cancer ([place])     Family History  Problem Relation Age of Onset  . Lung cancer Mother        lung  . Breast cancer Mother 34  . Ovarian cancer Mother 71  . Pancreatitis Father   . Breast cancer Sister 59  . Breast cancer Maternal Grandmother 59       breast   . Breast cancer Maternal Aunt 75       breast  . Melanoma Sister   . Breast cancer Sister 41  . Non-Hodgkin's lymphoma Paternal Grandmother     Past Surgical History:  Procedure Laterality Date  . BUNIONECTOMY Right   . CYSTOSCOPY WITH RETROGRADE PYELOGRAM, URETEROSCOPY AND STENT PLACEMENT Right 01/28/2020   Procedure: CYSTOSCOPY WITH RETROGRADE PYELOGRAM, URETEROSCOPY AND STENT PLACEMENT;  Surgeon: Alexis Frock, MD;  Location: [facility];  Service: Urology;  Laterality: Right;  . IR GENERIC HISTORICAL  11/14/2016   IR US GUIDE VASC ACCESS RIGHT 11/14/2016 [facility]  . IR GENERIC HISTORICAL  11/14/2016   IR FLUORO GUIDE CV LINE RIGHT 11/14/2016 [facility]  . IR GENERIC HISTORICAL  12/12/2016   IR US GUIDE VASC ACCESS RIGHT 12/12/2016 [name], MD  [facility]  . IR GENERIC HISTORICAL  12/12/2016   IR FLUORO GUIDE CV LINE RIGHT 12/12/2016 [name], MD [facility]  . SKIN CANCER EXCISION     bowens disease   Social History   Occupational History  . 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 Smoking status: Former Research scientist (life sciences)  .  Smokeless tobacco: Former Systems developer    Quit date: 09/20/1995  Vaping Use  . Vaping Use: Former  Substance and Sexual Activity  . Alcohol use: Yes    Alcohol/week: 0.0 standard drinks    Comment: occasional  . Drug use: Yes    Types: Marijuana  . Sexual activity: Never

## 2020-05-19 ENCOUNTER — Other Ambulatory Visit: Payer: Self-pay

## 2020-05-19 DIAGNOSIS — C210 Malignant neoplasm of anus, unspecified: Secondary | ICD-10-CM

## 2020-05-19 NOTE — Telephone Encounter (Signed)
Called patient and LMOVM to return call  Called patient and left a detailed voice message to let patient know I am placing a referral to GI for her.

## 2020-05-21 ENCOUNTER — Other Ambulatory Visit: Payer: Self-pay

## 2020-05-21 ENCOUNTER — Telehealth: Payer: Self-pay | Admitting: Physician Assistant

## 2020-05-21 MED ORDER — LEVOTHYROXINE SODIUM 50 MCG PO TABS
50.0000 ug | ORAL_TABLET | Freq: Every day | ORAL | 1 refills | Status: DC
Start: 2020-05-21 — End: 2020-07-06

## 2020-05-21 NOTE — Telephone Encounter (Signed)
Patient called requesting a refill of meloxicam. Please send to pharmacy on file. Patient phone number is [phone].

## 2020-05-22 ENCOUNTER — Other Ambulatory Visit: Payer: Self-pay | Admitting: Physician Assistant

## 2020-05-22 MED ORDER — MELOXICAM 15 MG PO TABS
ORAL_TABLET | ORAL | 0 refills | Status: DC
Start: 2020-05-22 — End: 2020-06-15

## 2020-05-22 NOTE — Telephone Encounter (Signed)
Called and lm on vm to advise that this was sent to [facility] [address]

## 2020-05-22 NOTE — Telephone Encounter (Signed)
Will refill Mobic

## 2020-05-22 NOTE — Telephone Encounter (Signed)
We did not write rx for this pt but would you consider refill of her mobic?

## 2020-05-27 ENCOUNTER — Encounter: Payer: Self-pay | Admitting: Physician Assistant

## 2020-05-27 ENCOUNTER — Ambulatory Visit: Payer: Medicare HMO | Admitting: Physician Assistant

## 2020-05-27 DIAGNOSIS — M25561 Pain in right knee: Secondary | ICD-10-CM

## 2020-05-27 DIAGNOSIS — G8929 Other chronic pain: Secondary | ICD-10-CM

## 2020-05-27 NOTE — Progress Notes (Signed)
Office Visit Note   Patient: Ashley Moore           Date of Birth: Dec 11, 1950           MRN: [id number] Visit Date: 05/27/2020              Requested by: [name], MD [place],  [place] [address] PCP: [name], MD  No chief complaint on file.     HPI: This is a pleasant woman who is following up today for her right valgus knee arthritis.  She again states that her anti-inflammatory seems to help with the pain.  She does say she wishes her leg was straight but she really is not painful.  She is also ordered Hoka shoes and orthotics to help with her flat feet.  She is fashioned her own splint for her finger that helps to keep her finger straight  Assessment & Plan: Visit Diagnoses: No diagnosis found.  Plan: She may follow-up as needed.  She understands if she had increasing pain certainly we could consider injecting her knee or viscosupplementation.  If she has instability because of her knee discontinued valgus she could obtain a brace  Follow-Up Instructions: No follow-ups on file.   Ortho Exam  Patient is alert, oriented, no adenopathy, well-dressed, normal affect, normal respiratory effort. Right knee nontender to palpation valgus deformity secondary to arthritis.  No effusion no cellulitis no tenderness to palpation over the lateral joint line  Imaging: No results found. No images are attached to the encounter.  Labs: Lab Results  Component Value Date   HGBA1C 5.5 05/11/2015   REPTSTATUS 01/28/2020 FINAL 01/28/2020   CULT MULTIPLE SPECIES PRESENT, SUGGEST RECOLLECTION (A) 01/28/2020     Lab Results  Component Value Date   ALBUMIN 3.5 03/27/2020   ALBUMIN 3.5 03/13/2020   ALBUMIN 3.7 03/02/2020    No results found for: MG Lab Results  Component Value Date   VD25OH 48 08/10/2010   VD25OH 46 11/11/2008    No results found for: PREALBUMIN CBC EXTENDED Latest Ref Rng & Units 04/13/2020 03/27/2020 03/13/2020  WBC 4.0 -  10.5 K/uL 4.9 3.3(L) 3.3(L)  RBC 3.87 - 5.11 MIL/uL 4.02 4.13 4.46  HGB 12.0 - 15.0 g/dL 12.1 12.0 12.7  HCT 36 - 46 % 36.6 36.7 39.5  PLT 150 - 400 K/uL 165 161 232  NEUTROABS 1.7 - 7.7 K/uL 4.1 2.4 2.4  LYMPHSABS 0.7 - 4.0 K/uL 0.1(L) 0.3(L) 0.4(L)     There is no height or weight on file to calculate BMI.  Orders:  No orders of the defined types were placed in this encounter.  No orders of the defined types were placed in this encounter.    Procedures: No procedures performed  Clinical Data: No additional findings.  ROS:  All other systems negative, except as noted in the HPI. Review of Systems  Objective: Vital Signs: There were no vitals taken for this visit.  Specialty Comments:  No specialty comments available.  PMFS History: Patient Active Problem List   Diagnosis Date Noted  . 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 Genetic testing 01/27/2020  . Family history of breast cancer   . Family history of lung cancer   . Family history of ovarian cancer   . Family history of non-Hodgkin's lymphoma   . 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 Port catheter in place 11/21/2016  . Secondary malignant neoplasm of vulva ([facility]) 11/14/2016  .  Anal cancer ([facility]) 11/03/2016  . Cystitis 05/06/2015  . Degenerative cervical disc 04/08/2015  . Trigger point of neck 03/26/2015  . Pain in joint, ankle and foot 04/23/2013  . Visit for preventive health examination 02/13/2013  . Routine gynecological examination 02/13/2013  . Family history of breast cancer in first degree relative 02/13/2013  . Rash and nonspecific skin eruption 02/13/2013  . 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 VITAMIN D DEFICIENCY 06/25/2008  . NECK PAIN 06/02/2008  . FOOT PAIN 06/02/2008  . OSTEOPENIA 06/02/2008   Past Medical History:  Diagnosis Date  . Bowen's disease    excised 1992  . Family history of breast cancer   . Family history of lung cancer   . Family history of  non-Hodgkin's lymphoma   . Family history of ovarian cancer   . Headache   . Hx of varicella   . 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([facility])   . Thyroid cancer ([place])     Family History  Problem Relation Age of Onset  . Lung cancer Mother        lung  . Breast cancer Mother 40  . Ovarian cancer Mother 34  . Pancreatitis Father   . Breast cancer Sister 75  . Breast cancer Maternal Grandmother 59       breast   . Breast cancer Maternal Aunt 75       breast  . Melanoma Sister   . Breast cancer Sister 25  . Non-Hodgkin's lymphoma Paternal Grandmother     Past Surgical History:  Procedure Laterality Date  . BUNIONECTOMY Right   . CYSTOSCOPY WITH RETROGRADE PYELOGRAM, URETEROSCOPY AND STENT PLACEMENT Right 01/28/2020   Procedure: CYSTOSCOPY WITH RETROGRADE PYELOGRAM, URETEROSCOPY AND STENT PLACEMENT;  Surgeon: Alexis Frock, MD;  [facility];  Service: Urology;  Laterality: Right;  . IR GENERIC HISTORICAL  11/14/2016   IR US GUIDE VASC ACCESS RIGHT 11/14/2016 [facility]  . IR GENERIC HISTORICAL  11/14/2016   IR FLUORO GUIDE CV LINE RIGHT 11/14/2016 [facility]  . IR GENERIC HISTORICAL  12/12/2016   IR US GUIDE VASC ACCESS RIGHT 12/12/2016 [name], MD [facility]  . IR GENERIC HISTORICAL  12/12/2016   IR FLUORO GUIDE CV LINE RIGHT 12/12/2016 [name], MD [facility]  . SKIN CANCER EXCISION     bowens disease   Social History   Occupational History  . 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 Smoking status: Former Research scientist (life sciences)  . Smokeless tobacco: Former Systems developer    Quit date: 09/20/1995  Vaping Use  . Vaping Use: Former  Substance and Sexual Activity  . Alcohol use: Yes    Alcohol/week: 0.0 standard drinks    Comment: occasional  . Drug use: Yes    Types: Marijuana  . Sexual activity: Never

## 2020-06-08 NOTE — Progress Notes (Signed)
NEUROLOGY FOLLOW UP OFFICE NOTE  Ashley Moore [id number]  HISTORY OF PRESENT ILLNESS: Ashley Moore is a 69 year old right-handed woman with degenerative arthritis of the cervical spine who follows up for bilateral hand numbness, cervicogenic headache, cervical spondylosis.  UPDATE: Last seen in January 2020.  At that time,. She told me that when she looks down, such as while typing or looking at her phone, her fingers (usually middle 3 fingers) turn while and tingling.  No radicular pain down arms or weakness of arms or hands.  It resolves as soon as she looks up.  She does lift heavy weights.  Previously the right side of her neck was sensitive but has since resolved.  MRI of cervical spine on 10/20/2018 showed severe chronic left-sided C1-C2 degeneration with mild stenosis at the cervicomedullary junction and at C5-C6 as well as moderate neural foraminal stenosis at the bilateral C5 through C8 and left C8 nerve levels.  Follow up NCV-EMG of upper extremities on 11/06/2018 confirmed bilateral mild chronic C5-C7 radiculopathy as well as mild bilateral carpal tunnel syndrome.  However, symptoms actually improved and it now rarely occurs.  She stretches her neck when possible and tries to keep her neck neutral position.  She doesn't feel any tension in her neck anymore.  No headaches.  She reports hearing a high-pitched buzzing in her head and both ears since her 73s.  She notices it mostly in the morning.  She thinks wearing the mask may be aggravating it.  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     HISTORY: On 10/28/14, her husband fell.When she tried to get him up, she developed this severe pain in the back of her head.She didn't notice pulling her neck.She has had a constant occipital headache since then.It is located on the left side.When she moves her neck or head slightly in any direction, she feels a burning shooting pain up to the front  of the head and down the left side of her neck to the shoulder.She denies any numbness or tingling in the back of the head or in the extremities.She denies visual symptoms, nausea, photophobia, phonophobia or dizziness.Besides head movement, stressand change in weatheralso exacerbates it.Applying pressure to the area seems to help.  Treatment: included Tylenol, heating pad, ice, massage (only briefly helps for 10 minutes), OMM (helpful).  She was on gabapentin for awhile which she discontinued in late 2019 as headaches had resolved.  MRI of the brain with and without contrast from 06/15/15 showed no acute intracranial process, but did reveal degenerative changes at left C1-2. MRI of cervical spine from 08/07/15 revealed showed moderate bilateral neural foraminal stenosis at C5, C6 and C7 nerve levels. There was also degenerative left C1-C2 articulation with acute marrow edema and involving the central C2 odontoid. Follow up CT of cervical spine verified extensive C1-2 degenerative changes.  PAST MEDICAL HISTORY: Past Medical History:  Diagnosis Date  . Bowen's disease    excised 1992  . Family history of breast cancer   . Family history of lung cancer   . Family history of non-Hodgkin's lymphoma   . Family history of ovarian cancer   . Headache   . Hx of varicella   . 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([facility])   . Thyroid cancer [facility])     MEDICATIONS: Current Outpatient Medications on File Prior to Visit  Medication Sig Dispense Refill  . calcium-vitamin D (OSCAL WITH D) 500-200 MG-UNIT tablet Take 1 tablet  by mouth daily.     . capecitabine (XELODA) 500 MG tablet TAKE 3 TABLETS (1,500 MG TOTAL) BY MOUTH 2 (TWO) TIMES DAILY AFTER A MEAL. TAKE MONDAY- FRIDAY, ONLY ON DAYS OF RADIATION. 52 tablet 0  . conjugated estrogens (PREMARIN) vaginal cream Place 0.5 Applicatorfuls vaginally at bedtime.     Marland Kitchen CRANBERRY SOFT PO Take 1 each by mouth daily.    Marland Kitchen levothyroxine (SYNTHROID) 50 MCG tablet Take 1  tablet (50 mcg total) by mouth daily. 90 tablet 1  . LORazepam (ATIVAN) 0.5 MG tablet One po every 4-6 hours prn anxiety or nausea 90 tablet 0  . meloxicam (MOBIC) 15 MG tablet TAKE 1 TABLET EVERY DAY  ( APPOINTMENT IS NEEDED  ) 90 tablet 0  . mirabegron ER (MYRBETRIQ) 25 MG TB24 tablet Take 25 mg by mouth daily.     . Misc. Devices (VAGINAL SUPPOSITORY APPLICATOR) MISC Place vaginally. Nightly for vaginal moisture    . ondansetron (ZOFRAN) 8 MG tablet TAKE 1 TABLET (8 MG TOTAL) BY MOUTH EVERY 8 (EIGHT) HOURS AS NEEDED FOR NAUSEA OR VOMITING. 90 tablet 0  . tiZANidine (ZANAFLEX) 4 MG tablet TAKE 1 TABLET EVERY 8 HOURS AS NEEDED FOR MUSCLE SPASM(S) 90 tablet 5  . Turmeric 500 MG CAPS Take 1 capsule by mouth daily.     No current facility-administered medications on file prior to visit.    ALLERGIES: Allergies  Allergen Reactions  . Dilaudid [Hydromorphone Hcl]     Throws up  Cannot tolerate, IV or IM   . Benadryl [Diphenhydramine] Other (See Comments)    Tingle all over   . Fentanyl Other (See Comments)    Reports it makes her violent and projectile vomiting  . Melatonin     Tingle all over   . Lemon Oil Diarrhea  . Penicillins Rash    FAMILY HISTORY: Family History  Problem Relation Age of Onset  . Lung cancer Mother        lung  . Breast cancer Mother 75  . Ovarian cancer Mother 56  . Pancreatitis Father   . Breast cancer Sister 15  . Breast cancer Maternal Grandmother 56       breast   . Breast cancer Maternal Aunt 75       breast  . Melanoma Sister   . Breast cancer Sister 31  . Non-Hodgkin's lymphoma Paternal Grandmother    SOCIAL HISTORY: Social History   Socioeconomic History  . Marital status: Widowed    Spouse name: Not on file  . Number of children: 0  . Years of education: 65  . Highest education level: Associate degree: academic program  Occupational History  . 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 Smoking  status: Former Research scientist (life sciences)  . Smokeless tobacco: Former Systems developer    Quit date: 09/20/1995  Vaping Use  . Vaping Use: Former  Substance and Sexual Activity  . Alcohol use: Yes    Alcohol/week: 0.0 standard drinks    Comment: occasional  . Drug use: Yes    Types: Marijuana  . Sexual activity: Never  Other Topics Concern  . Not on file  Social History Narrative   H H  of 1      5 pets.   She is a former smoker   Retired Programmer, applications; Conservator, museum/gallery   etoh   Red wine  1 per night.  Tea green tea and earl gray    Moved from [place] to [place] area in 12-28-83   1 pregnancy   Husband died spring  2016 cv   Sister died 03/30/15  Bone cancer    3 remaining sisters            Social Determinants of Health   Financial Resource Strain: Low Risk   . Difficulty of Paying Living Expenses: Not very hard  Food Insecurity: No Food Insecurity  . Worried About Charity fundraiser in the Last Year: Never true  . Ran Out of Food in the Last Year: Never true  Transportation Needs: No Transportation Needs  . Lack of Transportation (Medical): No  . Lack of Transportation (Non-Medical): No  Physical Activity: Insufficiently Active  . Days of Exercise per Week: 4 days  . Minutes of Exercise per Session: 30 min  Stress: Stress Concern Present  . Feeling of Stress : To some extent  Social Connections: Moderately Isolated  . Frequency of Communication with Friends and Family: More than three times a week  . Frequency of Social Gatherings with Friends and Family: Not on file  . Attends Religious Services: Never  . Active Member of Clubs or Organizations: Yes  . Attends Archivist Meetings: Never  . Marital Status: Widowed  Intimate Partner Violence:   . Fear of Current or Ex-Partner: Not on file  . Emotionally Abused: Not on file  . Physically Abused: Not on file  . Sexually Abused: Not on file    PHYSICAL EXAM: Blood pressure  139/83, pulse 73, height 5\' 2"  (1.575 m), weight 184 lb 6.4 oz (83.6 kg), SpO2 100 %. General: No acute distress.  Patient appears well-groomed.   Head:  Normocephalic/atraumatic Eyes:  Fundi examined but not visualized Neck: supple, right-sided suboccipital and paraspinal myofascial swelling but no tenderness, full range of motion Heart:  Regular rate and rhythm Lungs:  Clear to auscultation bilaterally Back: No paraspinal tenderness Neurological Exam: alert and oriented to person, place, and time. Attention span and concentration intact, recent and remote memory intact, fund of knowledge intact.  Speech fluent and not dysarthric, language intact.  CN II-XII intact. Bulk and tone normal, muscle strength 5/5 throughout.  Sensation to light touch, temperature and vibration intact.  Deep tendon reflexes 2+ throughout, toes downgoing.  Finger to nose and heel to shin testing intact.  Gait normal, Romberg negative.  IMPRESSION: 1.  Cervicogenic headache 2.  Cervical spondylosis 3.  Bilateral cervical radiculopathy and carpal tunnel syndrome  PLAN: Follow up as needed.  Total time spent with patient and accessing her chart:  17 minutes.  Metta Clines, DO  CC: Shanon Ace, MD

## 2020-06-09 ENCOUNTER — Other Ambulatory Visit: Payer: Self-pay

## 2020-06-09 ENCOUNTER — Other Ambulatory Visit (HOSPITAL_COMMUNITY): Payer: Medicare HMO

## 2020-06-09 ENCOUNTER — Encounter (HOSPITAL_BASED_OUTPATIENT_CLINIC_OR_DEPARTMENT_OTHER): Payer: Self-pay | Admitting: Urology

## 2020-06-09 NOTE — Progress Notes (Signed)
Spoke w/ via phone for pre-op interview---pt Lab needs dos----  I stat 8 (gent)             Lab results------none COVID test ------06-10-20 1100 am Arrive at -------1145 am 06-12-20 NPO after MN NO Solid Food.  Clear liquids from MN until---1045 am thwen npo Medications to take morning of surgery -----levothyroxine, lorazepam prn, mybetriq Diabetic medication -----n/a Patient Special Instructions -----none Pre-Op special Istructions -----none Patient verbalized understanding of instructions that were given at this phone interview. Patient denies shortness of breath, chest pain, fever, cough at this phone interview.

## 2020-06-10 ENCOUNTER — Ambulatory Visit: Payer: Medicare HMO | Admitting: Neurology

## 2020-06-10 ENCOUNTER — Other Ambulatory Visit (HOSPITAL_COMMUNITY)
Admission: RE | Admit: 2020-06-10 | Discharge: 2020-06-10 | Disposition: A | Discharge status: Home / Self Care | Payer: Medicare HMO | Source: Ambulatory Visit | Attending: Urology | Admitting: Urology

## 2020-06-10 ENCOUNTER — Encounter: Payer: Self-pay | Admitting: Neurology

## 2020-06-10 VITALS — BP 139/83 | HR 73 | Ht 62.0 in | Wt 184.4 lb

## 2020-06-10 DIAGNOSIS — M47812 Spondylosis without myelopathy or radiculopathy, cervical region: Secondary | ICD-10-CM

## 2020-06-10 DIAGNOSIS — Z20822 Contact with and (suspected) exposure to covid-19: Secondary | ICD-10-CM | POA: Diagnosis not present

## 2020-06-10 DIAGNOSIS — Z01812 Encounter for preprocedural laboratory examination: Secondary | ICD-10-CM | POA: Insufficient documentation

## 2020-06-10 DIAGNOSIS — H9313 Tinnitus, bilateral: Secondary | ICD-10-CM

## 2020-06-10 LAB — SARS CORONAVIRUS 2 (TAT 6-24 HRS): SARS Coronavirus 2: NEGATIVE

## 2020-06-12 ENCOUNTER — Ambulatory Visit (HOSPITAL_BASED_OUTPATIENT_CLINIC_OR_DEPARTMENT_OTHER)
Admission: RE | Admit: 2020-06-12 | Discharge: 2020-06-12 | Disposition: A | Discharge status: Home / Self Care | Payer: Medicare HMO | Attending: Urology | Admitting: Urology

## 2020-06-12 ENCOUNTER — Encounter (HOSPITAL_BASED_OUTPATIENT_CLINIC_OR_DEPARTMENT_OTHER): Payer: Self-pay | Admitting: Urology

## 2020-06-12 ENCOUNTER — Encounter (HOSPITAL_BASED_OUTPATIENT_CLINIC_OR_DEPARTMENT_OTHER)
Admission: RE | Disposition: A | Discharge status: Home / Self Care | Payer: Self-pay | Source: Home / Self Care | Attending: Urology

## 2020-06-12 ENCOUNTER — Other Ambulatory Visit: Payer: Self-pay

## 2020-06-12 ENCOUNTER — Ambulatory Visit (HOSPITAL_BASED_OUTPATIENT_CLINIC_OR_DEPARTMENT_OTHER): Payer: Medicare HMO | Admitting: Certified Registered"

## 2020-06-12 DIAGNOSIS — Z4659 Encounter for fitting and adjustment of other gastrointestinal appliance and device: Secondary | ICD-10-CM | POA: Diagnosis not present

## 2020-06-12 DIAGNOSIS — E039 Hypothyroidism, unspecified: Secondary | ICD-10-CM | POA: Diagnosis not present

## 2020-06-12 DIAGNOSIS — Y838 Other surgical procedures as the cause of abnormal reaction of the patient, or of later complication, without mention of misadventure at the time of the procedure: Secondary | ICD-10-CM | POA: Diagnosis not present

## 2020-06-12 DIAGNOSIS — Z9221 Personal history of antineoplastic chemotherapy: Secondary | ICD-10-CM | POA: Insufficient documentation

## 2020-06-12 DIAGNOSIS — Z85850 Personal history of malignant neoplasm of thyroid: Secondary | ICD-10-CM | POA: Insufficient documentation

## 2020-06-12 DIAGNOSIS — C772 Secondary and unspecified malignant neoplasm of intra-abdominal lymph nodes: Secondary | ICD-10-CM | POA: Diagnosis not present

## 2020-06-12 DIAGNOSIS — N1330 Unspecified hydronephrosis: Secondary | ICD-10-CM | POA: Diagnosis not present

## 2020-06-12 DIAGNOSIS — Z923 Personal history of irradiation: Secondary | ICD-10-CM | POA: Diagnosis not present

## 2020-06-12 DIAGNOSIS — N130 Hydronephrosis with ureteropelvic junction obstruction: Secondary | ICD-10-CM | POA: Diagnosis not present

## 2020-06-12 DIAGNOSIS — T8389XA Other specified complication of genitourinary prosthetic devices, implants and grafts, initial encounter: Secondary | ICD-10-CM | POA: Diagnosis not present

## 2020-06-12 DIAGNOSIS — Z87891 Personal history of nicotine dependence: Secondary | ICD-10-CM | POA: Insufficient documentation

## 2020-06-12 DIAGNOSIS — Z85048 Personal history of other malignant neoplasm of rectum, rectosigmoid junction, and anus: Secondary | ICD-10-CM | POA: Insufficient documentation

## 2020-06-12 DIAGNOSIS — N1339 Other hydronephrosis: Secondary | ICD-10-CM | POA: Insufficient documentation

## 2020-06-12 HISTORY — DX: Malignant (primary) neoplasm, unspecified: C80.1

## 2020-06-12 HISTORY — DX: Unspecified hydronephrosis: N13.30

## 2020-06-12 HISTORY — DX: Unspecified osteoarthritis, unspecified site: M19.90

## 2020-06-12 LAB — POCT I-STAT, CHEM 8
BUN: 14 mg/dL (ref 8–23)
Calcium, Ion: 1.22 mmol/L (ref 1.15–1.40)
Chloride: 103 mmol/L (ref 98–111)
Creatinine, Ser: 0.6 mg/dL (ref 0.44–1.00)
Glucose, Bld: 79 mg/dL (ref 70–99)
HCT: 40 % (ref 36.0–46.0)
Hemoglobin: 13.6 g/dL (ref 12.0–15.0)
Potassium: 4.6 mmol/L (ref 3.5–5.1)
Sodium: 141 mmol/L (ref 135–145)
TCO2: 27 mmol/L (ref 22–32)

## 2020-06-12 SURGERY — CYSTOSCOPY, WITH RETROGRADE PYELOGRAM AND URETERAL STENT INSERTION
Anesthesia: General | Site: Urethra | Laterality: Right

## 2020-06-12 MED ORDER — ONDANSETRON HCL 4 MG/2ML IJ SOLN
INTRAMUSCULAR | Status: AC
  Filled 2020-06-12: qty 2

## 2020-06-12 MED ORDER — GENTAMICIN SULFATE 40 MG/ML IJ SOLN
5.0000 mg/kg | INTRAVENOUS | Status: AC
  Administered 2020-06-12: 310 mg via INTRAVENOUS
  Filled 2020-06-12: qty 7.75

## 2020-06-12 MED ORDER — ONDANSETRON HCL 4 MG/2ML IJ SOLN: 4.0000 mg | Freq: Once | INTRAMUSCULAR | Status: DC | PRN

## 2020-06-12 MED ORDER — LIDOCAINE 2% (20 MG/ML) 5 ML SYRINGE
INTRAMUSCULAR | Status: DC | PRN
  Administered 2020-06-12: 100 mg via INTRAVENOUS

## 2020-06-12 MED ORDER — EPHEDRINE SULFATE-NACL 50-0.9 MG/10ML-% IV SOSY
PREFILLED_SYRINGE | INTRAVENOUS | Status: DC | PRN
  Administered 2020-06-12: 10 mg via INTRAVENOUS

## 2020-06-12 MED ORDER — PROPOFOL 10 MG/ML IV BOLUS
INTRAVENOUS | Status: AC
  Filled 2020-06-12: qty 20

## 2020-06-12 MED ORDER — ONDANSETRON HCL 4 MG/2ML IJ SOLN
INTRAMUSCULAR | Status: DC | PRN
  Administered 2020-06-12: 4 mg via INTRAVENOUS

## 2020-06-12 MED ORDER — LIDOCAINE 2% (20 MG/ML) 5 ML SYRINGE
INTRAMUSCULAR | Status: AC
  Filled 2020-06-12: qty 5

## 2020-06-12 MED ORDER — LACTATED RINGERS IV SOLN
INTRAVENOUS | Status: DC
  Administered 2020-06-12: 50 mL via INTRAVENOUS

## 2020-06-12 MED ORDER — FENTANYL CITRATE (PF) 100 MCG/2ML IJ SOLN
INTRAMUSCULAR | Status: DC | PRN
Start: 2020-06-12 — End: 2020-06-12
  Administered 2020-06-12: 75 ug via INTRAVENOUS
  Administered 2020-06-12: 25 ug via INTRAVENOUS

## 2020-06-12 MED ORDER — PROPOFOL 10 MG/ML IV BOLUS
INTRAVENOUS | Status: DC | PRN
  Administered 2020-06-12: 150 mg via INTRAVENOUS

## 2020-06-12 MED ORDER — FENTANYL CITRATE (PF) 100 MCG/2ML IJ SOLN
INTRAMUSCULAR | Status: AC
  Filled 2020-06-12: qty 2

## 2020-06-12 MED ORDER — TRAMADOL HCL 50 MG PO TABS
50.0000 mg | ORAL_TABLET | Freq: Four times a day (QID) | ORAL | 0 refills | Status: DC | PRN
Start: 2020-06-12 — End: 2020-09-02

## 2020-06-12 MED ORDER — IOHEXOL 300 MG/ML  SOLN
INTRAMUSCULAR | Status: DC | PRN
  Administered 2020-06-12: 10 mL via URETHRAL

## 2020-06-12 MED ORDER — DEXAMETHASONE SODIUM PHOSPHATE 10 MG/ML IJ SOLN
INTRAMUSCULAR | Status: DC | PRN
  Administered 2020-06-12: 5 mg via INTRAVENOUS

## 2020-06-12 MED ORDER — DEXAMETHASONE SODIUM PHOSPHATE 10 MG/ML IJ SOLN
INTRAMUSCULAR | Status: AC
  Filled 2020-06-12: qty 1

## 2020-06-12 MED ORDER — ACETAMINOPHEN 10 MG/ML IV SOLN: 1000.0000 mg | Freq: Once | INTRAVENOUS | Status: DC | PRN

## 2020-06-12 MED ORDER — MORPHINE SULFATE (PF) 4 MG/ML IV SOLN: 1.0000 mg | INTRAVENOUS | Status: DC | PRN

## 2020-06-12 SURGICAL SUPPLY — 18 items
BAG DRAIN URO-CYSTO SKYTR STRL (DRAIN) ×3 IMPLANT
BAG DRN UROCATH (DRAIN) ×2
CATH INTERMIT  6FR 70CM (CATHETERS) ×2 IMPLANT
CLOTH BEACON ORANGE TIMEOUT ST (SAFETY) ×3 IMPLANT
GLOVE BIO SURGEON STRL SZ7.5 (GLOVE) ×3 IMPLANT
GLOVE ECLIPSE 6.5 STRL STRAW (GLOVE) ×4 IMPLANT
GOWN STRL REUS W/ TWL LRG LVL3 (GOWN DISPOSABLE) ×1 IMPLANT
GOWN STRL REUS W/TWL LRG LVL3 (GOWN DISPOSABLE) ×6 IMPLANT
GUIDEWIRE ANG ZIPWIRE 038X150 (WIRE) ×3 IMPLANT
GUIDEWIRE STR DUAL SENSOR (WIRE) ×3 IMPLANT
IV NS IRRIG 3000ML ARTHROMATIC (IV SOLUTION) ×3 IMPLANT
KIT TURNOVER CYSTO (KITS) ×3 IMPLANT
MANIFOLD NEPTUNE II (INSTRUMENTS) ×3 IMPLANT
PACK CYSTO (CUSTOM PROCEDURE TRAY) ×3 IMPLANT
STENT POLARIS LOOP 8FR X 24 CM (STENTS) ×2 IMPLANT
SYR 10ML LL (SYRINGE) ×3 IMPLANT
TUBE CONNECTING 12X1/4 (SUCTIONS) ×3 IMPLANT
TUBING UROLOGY SET (TUBING) ×1 IMPLANT

## 2020-06-12 NOTE — Brief Op Note (Signed)
06/12/2020  3:22 PM  PATIENT:  Ashley Moore  69 y.o. female  PRE-OPERATIVE DIAGNOSIS:  RIGHT MALIGNANT HYDRONEPHROSIS  POST-OPERATIVE DIAGNOSIS:  * No post-op diagnosis entered *  PROCEDURE:  Procedure(s) with comments: CYSTOSCOPY WITH RETROGRADE PYELOGRAM/URETERAL STENT EXCHANGE (Right) - 45 MINS CYSTOSCOPY WITH STENT REMOVAL (Right)  SURGEON:  Surgeon(s) and Role:    * Alexis Frock, MD - Primary  PHYSICIAN ASSISTANT:   ASSISTANTS: none   ANESTHESIA:   general  EBL:  minimal   BLOOD ADMINISTERED:none  DRAINS: none   LOCAL MEDICATIONS USED:  NONE  SPECIMEN:  No Specimen  DISPOSITION OF SPECIMEN:  N/A  COUNTS:  YES  TOURNIQUET:  * No tourniquets in log *  DICTATION: .Other Dictation: Dictation Number [id number]  PLAN OF CARE: Discharge to home after PACU  PATIENT DISPOSITION:  PACU - hemodynamically stable.   Delay start of Pharmacological VTE agent (>24hrs) due to surgical blood loss or risk of bleeding: not applicable

## 2020-06-12 NOTE — Discharge Instructions (Signed)
[facility] [phone] Post Ureteroscopy With or Without Stent Instructions  Definitions:  Ureter: The duct that transports urine from the kidney to the bladder. Stent:   A plastic hollow tube that is placed into the ureter, from the kidney to the bladder to prevent the ureter from swelling shut.  GENERAL INSTRUCTIONS:  Despite the fact that no skin incisions were used, the area around the ureter and bladder is raw and irritated. The stent is a foreign body which will further irritate the bladder wall. This irritation is manifested by increased frequency of urination, both day and night, and by an increase in the urge to urinate. In some, the urge to urinate is present almost always. Sometimes the urge is strong enough that you may not be able to stop yourself from urinating. The only real cure is to remove the stent and then give time for the bladder wall to heal which can't be done until the danger of the ureter swelling shut has passed, which varies.  You may see some blood in your urine while the stent is in place and a few days afterwards. Do not be alarmed, even if the urine was clear for a while. Get off your feet and drink lots of fluids until clearing occurs. If you start to pass clots or don't improve, call us.  DIET: You may return to your normal diet immediately. Because of the raw surface of your bladder, alcohol, spicy foods, acid type foods and drinks with caffeine may cause irritation or frequency and should be used in moderation. To keep your urine flowing freely and to avoid constipation, drink plenty of fluids during the day ( 8-10 glasses ). Tip: Avoid cranberry juice because it is very acidic.  ACTIVITY: Your physical activity doesn't need to be restricted. However, if you are very active, you may see some blood in your urine. We suggest that you reduce your activity under these circumstances until the bleeding has stopped.  BOWELS: It is important to  keep your bowels regular during the postoperative period. Straining with bowel movements can cause bleeding. A bowel movement every other day is reasonable. Use a mild laxative if needed, such as Milk of Magnesia 2-3 tablespoons, or 2 Dulcolax tablets. Call if you continue to have problems. If you have been taking narcotics for pain, before, during or after your surgery, you may be constipated. Take a laxative if necessary.   MEDICATION: You should resume your pre-surgery medications unless told not to. In addition you will often be given an antibiotic to prevent infection. These should be taken as prescribed until the bottles are finished unless you are having an unusual reaction to one of the drugs.  PROBLEMS YOU SHOULD REPORT TO [place]:  Fevers over 100.5 Fahrenheit.  Heavy bleeding, or clots ( See above notes about blood in urine ).  Inability to urinate.  Drug reactions ( hives, rash, nausea, vomiting, diarrhea ).  Severe burning or pain with urination that is not improving.  FOLLOW-UP: You will need a follow-up appointment to monitor your progress. Call for this appointment at the number listed above. Usually the first appointment will be about three to fourteen days after your surgery.      Post Anesthesia Home Care Instructions  Activity: Get plenty of rest for the remainder of the day. A responsible adult should stay with you for 24 hours following the procedure.  For the next 24 hours, DO NOT: -Drive a car -Paediatric nurse -Drink alcoholic beverages -Take  any medication unless instructed by your physician -Make any legal decisions or sign important papers.  Meals: Start with liquid foods such as gelatin or soup. Progress to regular foods as tolerated. Avoid greasy, spicy, heavy foods. If nausea and/or vomiting occur, drink only clear liquids until the nausea and/or vomiting subsides. Call your physician if vomiting continues.  Special Instructions/Symptoms: Your throat  may feel dry or sore from the anesthesia or the breathing tube placed in your throat during surgery. If this causes discomfort, gargle with warm salt water. The discomfort should disappear within 24 hours.  If you had a scopolamine patch placed behind your ear for the management of post- operative nausea and/or vomiting:  1. The medication in the patch is effective for 72 hours, after which it should be removed.  Wrap patch in a tissue and discard in the trash. Wash hands thoroughly with soap and water. 2. You may remove the patch earlier than 72 hours if you experience unpleasant side effects which may include dry mouth, dizziness or visual disturbances. 3. Avoid touching the patch. Wash your hands with soap and water after contact with the patch.   1 - You may have urinary urgency (bladder spasms) and bloody urine on / off with stent in place. This is normal.  2 - Call MD or go to ER for fever >102, severe pain / nausea / vomiting not relieved by medications, or acute change in medical status

## 2020-06-12 NOTE — Anesthesia Postprocedure Evaluation (Signed)
Anesthesia Post Note  Patient: MYRTLE HALLER  Procedure(s) Performed: CYSTOSCOPY WITH RETROGRADE PYELOGRAM/URETERAL STENT EXCHANGE (Right Urethra)     Patient location during evaluation: PACU Anesthesia Type: General Level of consciousness: awake and alert Pain management: pain level controlled Vital Signs Assessment: post-procedure vital signs reviewed and stable Respiratory status: spontaneous breathing, nonlabored ventilation, respiratory function stable and patient connected to nasal cannula oxygen Cardiovascular status: blood pressure returned to baseline and stable Postop Assessment: no apparent nausea or vomiting Anesthetic complications: no   No complications documented.  Last Vitals:  Vitals:   06/12/20 1624 06/12/20 1625  BP:    Pulse: 79 73  Resp: 14 11  Temp:  (!) 36.4 C  SpO2: 100% 100%    Last Pain:  Vitals:   06/12/20 1625  TempSrc: Oral  PainSc:                  Cherell Colvin S

## 2020-06-12 NOTE — Op Note (Signed)
NAME: Ashley Moore, Ashley Moore MEDICAL RECORD [id number] ACCOUNT [id number] DATE OF BIRTH:April 25, 1951 FACILITY: [facility] LOCATION: [facility] PHYSICIAN:Ashley Aderhold Tresa Moore, MD  OPERATIVE REPORT  DATE OF PROCEDURE:  06/12/2020  SURGEON:  Alexis Frock, MD  PREOPERATIVE DIAGNOSIS:  Right malignant hydronephrosis.  PROCEDURE: 1.  Cystoscopy, right retrograde pyelogram, interpretation. 2.  Exchange of right ureteral stent, 8 x 24 Polaris, no tether.  ESTIMATED BLOOD LOSS:  Nil.  COMPLICATIONS:  None.  SPECIMEN:  Right ureteral stent for discard.  FINDINGS: 1.  Minimal encrustation of right ureteral stent. 2.  Improved hydronephrosis following stenting, somewhat decrease in relative narrowing in the mid ureter, but persistent. 3.  Successful replacement of right ureteral stent, proximal end in renal pelvis, distally in urinary bladder. 4.  Mild radiation cystitis changes.  INDICATIONS:  The patient is a pleasant, but unfortunate 69 year old woman with history of metastatic GI cancer.  She was found on restaging imaging to have significant hydronephrosis prior to radiation of a metastatic retroperitoneal lymph node.  She  was referred for consideration of management.  Options were discussed including recommended path of stenting to help preserve renal function, especially during her cancer therapy, and she wished to proceed.  She underwent placement of ureteral stent  approximately 4 months ago and has done fairly well from this, other than some slight worsening of some baseline irritative voiding, as well as likely radiation cystitis changes.  She presents today for her first stent exchange versus removal.  Informed  consent was obtained and placed in medical record.  DESCRIPTION OF PROCEDURE:  The patient being identified, the procedure being right ureteral stent exchange versus removal was confirmed.  Procedure timeout was performed.  Intravenous antibiotics were administered.  General  anesthesia induced.  The  patient was placed into a low lithotomy position.  Sterile field was created, prepped and draped the patient's vagina, introitus and proximal thighs  There was some chronic appearing purpuric changes to the labia majora, likely due to prior radiation.   No evidence of any skin breakdown.  Cystourethroscopy was performed using 21-French rigid cystoscope with offset lens.  There was some tightening of the urethra, again likely consistent with radiation changes as well as some mild radiation cystitis,  mostly in the dome.  The distal end of right ureteral stent was seen.  It was grasped, brought to the level of the urethral meatus.  A 0.03 ZIPwire was advanced and exchanged for open-ended catheter and right retrograde pyelogram was obtained.  Right retrograde pyelogram demonstrated  a single right ureter single, system right kidney.  There was resolution of prior hydronephrosis.  There was some persistent but improved narrowing of the mid ureter.  Spot fluoroscopic images were obtained for  documentation.  Although this is improved, it was clearly felt that interval stenting would still be warranted.  As such, a new 8 x 24 Polaris-type stent was placed over the Sensor working wire using fluoroscopic guidance.  Good proximal and distal  planes were noted.  Anesthesia was terminated.  The patient tolerated the procedure well.  No immediate perioperative complications.  The patient was taken to postanesthesia care unit in stable condition.  Plan for discharge home.  VN/NUANCE  D:06/12/2020 T:06/12/2020 JOB:[id number]

## 2020-06-12 NOTE — Transfer of Care (Signed)
Immediate Anesthesia Transfer of Care Note  Patient: Ashley Moore  Procedure(s) Performed: Procedure(s) (LRB): CYSTOSCOPY WITH RETROGRADE PYELOGRAM/URETERAL STENT EXCHANGE (Right)  Patient Location: PACU  Anesthesia Type: General  Level of Consciousness: awake, oriented, sedated and patient cooperative  Airway & Oxygen Therapy: Patient Spontanous Breathing and Patient connected to face mask oxygen  Post-op Assessment: Report given to PACU RN and Post -op Vital signs reviewed and stable  Post vital signs: Reviewed and stable  Complications: No apparent anesthesia complications Last Vitals:  Vitals Value Taken Time  BP 118/65 06/12/20 1537  Temp 36.4 C 06/12/20 1537  Pulse 78 06/12/20 1540  Resp 10 06/12/20 1540  SpO2 100 % 06/12/20 1540  Vitals shown include unvalidated device data.  Last Pain:  Vitals:   06/12/20 1500  TempSrc: Oral  PainSc:       Patients Stated Pain Goal: 5 (72/25/75 0518)  Complications: No complications documented.

## 2020-06-12 NOTE — Anesthesia Procedure Notes (Signed)
Procedure Name: LMA Insertion Date/Time: 06/12/2020 2:55 PM Performed by: Suan Halter, CRNA Pre-anesthesia Checklist: Patient identified, Emergency Drugs available, Suction available and Patient being monitored Patient Re-evaluated:Patient Re-evaluated prior to induction Oxygen Delivery Method: Circle system utilized Preoxygenation: Pre-oxygenation with 100% oxygen Induction Type: IV induction Ventilation: Mask ventilation without difficulty LMA: LMA inserted LMA Size: 4.0 Number of attempts: 1 Airway Equipment and Method: Bite block Placement Confirmation: positive ETCO2 Tube secured with: Tape Dental Injury: Teeth and Oropharynx as per pre-operative assessment

## 2020-06-12 NOTE — H&P (Signed)
Ashley Moore is an 69 y.o. female.    Chief Complaint: Pre-OP RIGHT ureteral stent exchange v. removal  HPI:   1 - Right Malignant Ureteral Obstruction - h/o advanced anal cancer s/p surgery, chemo with new retroperitoneal adenopathy and Rt hydro on eval flank pain. CR 0.93, UA without over infectious parameters.   Recent Course:  01/2020 - Rt 8x24 polaris stent, Cr 04/2020 0.77 ==> received XRT under [facility] sig for rectal cancer. NO CV disease / blood thinners. Her PCP is Teola Bradley MD. Her medical oncologist is Betsy Coder MD.     Today "Charlee" is seen to exchange v. Remove her right stent. She is s/p radiation. XXXX chemo use. Most recetn UCX negative.   Past Medical History:  Diagnosis Date  . Arthritis   . Bowen's disease    excised 1992  . Cancer [facility])    rectal cancer 2018 chemo and radiation done  . Family history of breast cancer   . Family history of lung cancer   . Family history of non-Hodgkin's lymphoma   . Family history of ovarian cancer   . Hx of varicella   . Hydronephrosis    right malignant    Past Surgical History:  Procedure Laterality Date  . BUNIONECTOMY Right yrs ago  . CYSTOSCOPY WITH RETROGRADE PYELOGRAM, URETEROSCOPY AND STENT PLACEMENT Right 01/28/2020   Procedure: CYSTOSCOPY WITH RETROGRADE PYELOGRAM, URETEROSCOPY AND STENT PLACEMENT;  Surgeon: Alexis Frock, MD;  Location: [facility];  Service: Urology;  Laterality: Right;  . IR GENERIC HISTORICAL  11/14/2016   IR US GUIDE VASC ACCESS RIGHT 11/14/2016 [facility]  . IR GENERIC HISTORICAL  11/14/2016   IR FLUORO GUIDE CV LINE RIGHT 11/14/2016 [facility]  . IR GENERIC HISTORICAL  12/12/2016   IR US GUIDE VASC ACCESS RIGHT 12/12/2016 [name], MD [facility]  . IR GENERIC HISTORICAL  12/12/2016   IR FLUORO GUIDE CV LINE RIGHT 12/12/2016 [name], MD [facility]  . 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 Lung  cancer Mother        lung  . Breast cancer Mother 31  . Ovarian cancer Mother 23  . Pancreatitis Father   . Breast cancer Sister 64  . Breast cancer Maternal Grandmother 60       breast   . Breast cancer Maternal Aunt 75       breast  . Melanoma Sister   . Breast cancer Sister 35  . Non-Hodgkin's lymphoma Paternal Grandmother    Social History:  reports that she quit smoking about 24 years ago. Her smoking use included cigarettes. She has a 25.00 pack-year smoking history. She has never used smokeless tobacco. She reports current alcohol use. She reports current drug use. Drug: Marijuana.  Allergies:  Allergies  Allergen Reactions  . Dilaudid [Hydromorphone Hcl]     Throws up  Cannot tolerate, IV or IM   . Benadryl [Diphenhydramine] Other (See Comments)    Tingle all over   . Fentanyl Other (See Comments)    Reports it makes her violent and projectile vomiting  . Melatonin     Tingle all over   . Lemon Oil Diarrhea  . Penicillins Rash    No medications prior to admission.    Results for orders placed or performed during the hospital encounter of 06/10/20 (from the past 48 hour(s))  SARS CORONAVIRUS 2 (TAT  6-24 HRS) Nasopharyngeal Nasopharyngeal Swab     Status: None   Collection Time: 06/10/20 10:09 AM   Specimen: Nasopharyngeal Swab  Result Value Ref Range   SARS Coronavirus 2 NEGATIVE NEGATIVE    Comment: (NOTE) SARS-CoV-2 target nucleic acids are NOT DETECTED.  The SARS-CoV-2 RNA is generally detectable in upper and lower respiratory specimens during the acute phase of infection. Negative results do not preclude SARS-CoV-2 infection, do not rule out co-infections with other pathogens, and should not be used as the sole basis for treatment or other patient management decisions. Negative results must be combined with clinical observations, patient history, and epidemiological information. The expected result is Negative.  Fact Sheet for  Patients: SugarRoll.be  Fact Sheet for Healthcare Providers: https://www.woods-mathews.com/  This test is not yet approved or cleared by the [place] FDA and  has been authorized for detection and/or diagnosis of SARS-CoV-2 by FDA under an Emergency Use Authorization (EUA). This EUA will remain  in effect (meaning this test can be used) for the duration of the COVID-19 declaration under Se ction 564(b)(1) of the Act, 21 U.S.C. section 360bbb-3(b)(1), unless the authorization is terminated or revoked sooner.  Performed at [facility], [place] [address] [place], [place] [address]    No results found.  Review of Systems  Height 5\' 2"  (1.575 m), weight 81.6 kg. Physical Exam   Assessment/Plan  Proceed as planned with cysto, RIGHT retrograde, possible diagnostic ureteroscopy, possible stent exchange v. Removal for malignant obstruction. Risks, benefits, alternatives, expected peri-op course discussed previously and reiterated today.   Alexis Frock, MD 06/12/2020, 7:07 AM

## 2020-06-12 NOTE — Anesthesia Preprocedure Evaluation (Signed)
Anesthesia Evaluation  Patient identified by MRN, date of birth, ID band Patient awake    Reviewed: Allergy & Precautions, NPO status , Patient's Chart, lab work & pertinent test results  Airway Mallampati: II  TM Distance: >3 FB Neck ROM: Full    Dental  (+) Dental Advisory Given, Missing, Teeth Intact   Pulmonary neg pulmonary ROS, former smoker,    Pulmonary exam normal breath sounds clear to auscultation       Cardiovascular negative cardio ROS Normal cardiovascular exam Rhythm:Regular Rate:Normal     Neuro/Psych  Headaches, negative psych ROS   GI/Hepatic negative GI ROS, Neg liver ROS, Rectal CA   Endo/Other  Hypothyroidism Hx of Thyoid CA  Renal/GU Renal diseaseK+ 3.5 Cr 0.96     Musculoskeletal  (+) Arthritis ,   Abdominal   Peds  Hematology Hgb 12.2   Anesthesia Other Findings   Reproductive/Obstetrics negative OB ROS                            Anesthesia Physical  Anesthesia Plan  ASA: II  Anesthesia Plan: General   Post-op Pain Management:    Induction: Intravenous  PONV Risk Score and Plan: 3 and Treatment may vary due to age or medical condition, Ondansetron, Dexamethasone and Midazolam  Airway Management Planned: LMA  Additional Equipment: None  Intra-op Plan:   Post-operative Plan: Extubation in OR  Informed Consent: I have reviewed the patients History and Physical, chart, labs and discussed the procedure including the risks, benefits and alternatives for the proposed anesthesia with the patient or authorized representative who has indicated his/her understanding and acceptance.     Dental advisory given  Plan Discussed with: CRNA  Anesthesia Plan Comments:         Anesthesia Quick Evaluation

## 2020-06-15 ENCOUNTER — Other Ambulatory Visit: Payer: Medicare HMO

## 2020-06-15 ENCOUNTER — Telehealth: Payer: Self-pay

## 2020-06-15 ENCOUNTER — Encounter (HOSPITAL_BASED_OUTPATIENT_CLINIC_OR_DEPARTMENT_OTHER): Payer: Self-pay | Admitting: Urology

## 2020-06-15 ENCOUNTER — Other Ambulatory Visit: Payer: Self-pay | Admitting: Physician Assistant

## 2020-06-15 MED ORDER — MELOXICAM 15 MG PO TABS
ORAL_TABLET | ORAL | 0 refills | Status: DC
Start: 2020-06-15 — End: 2020-09-07

## 2020-06-15 NOTE — Telephone Encounter (Signed)
Please see message below

## 2020-06-15 NOTE — Telephone Encounter (Signed)
Refill done.  

## 2020-06-15 NOTE — Telephone Encounter (Signed)
Patient would like a Rx refill on Meloxicam.  Cb# [phone].  Please advise.  Thank you.

## 2020-06-16 ENCOUNTER — Ambulatory Visit: Payer: Medicare HMO | Admitting: Oncology

## 2020-06-17 ENCOUNTER — Ambulatory Visit (HOSPITAL_COMMUNITY)
Admission: RE | Admit: 2020-06-17 | Discharge: 2020-06-17 | Disposition: A | Discharge status: Home / Self Care | Payer: Medicare HMO | Source: Ambulatory Visit | Attending: Nurse Practitioner | Admitting: Nurse Practitioner

## 2020-06-17 ENCOUNTER — Ambulatory Visit (HOSPITAL_BASED_OUTPATIENT_CLINIC_OR_DEPARTMENT_OTHER): Payer: Medicare HMO | Admitting: Medical

## 2020-06-17 ENCOUNTER — Other Ambulatory Visit: Payer: Self-pay

## 2020-06-17 ENCOUNTER — Inpatient Hospital Stay: Payer: Medicare HMO | Attending: Gynecologic Oncology

## 2020-06-17 ENCOUNTER — Encounter (HOSPITAL_COMMUNITY): Payer: Self-pay

## 2020-06-17 DIAGNOSIS — Z23 Encounter for immunization: Secondary | ICD-10-CM | POA: Insufficient documentation

## 2020-06-17 DIAGNOSIS — N1330 Unspecified hydronephrosis: Secondary | ICD-10-CM | POA: Diagnosis not present

## 2020-06-17 DIAGNOSIS — C210 Malignant neoplasm of anus, unspecified: Secondary | ICD-10-CM

## 2020-06-17 DIAGNOSIS — I700 Atherosclerosis of aorta: Secondary | ICD-10-CM | POA: Diagnosis not present

## 2020-06-17 DIAGNOSIS — K5750 Diverticulosis of both small and large intestine without perforation or abscess without bleeding: Secondary | ICD-10-CM | POA: Diagnosis not present

## 2020-06-17 LAB — CMP (CANCER CENTER ONLY)
ALT: 16 U/L (ref 0–44)
AST: 23 U/L (ref 15–41)
Albumin: 3.5 g/dL (ref 3.5–5.0)
Alkaline Phosphatase: 79 U/L (ref 38–126)
Anion gap: 4 — ABNORMAL LOW (ref 5–15)
BUN: 16 mg/dL (ref 8–23)
CO2: 29 mmol/L (ref 22–32)
Calcium: 9 mg/dL (ref 8.9–10.3)
Chloride: 104 mmol/L (ref 98–111)
Creatinine: 0.7 mg/dL (ref 0.44–1.00)
GFR, Est AFR Am: >60 mL/min (ref >60)
GFR, Estimated: >60 mL/min (ref >60)
Glucose, Bld: 87 mg/dL (ref 70–99)
Potassium: 4.1 mmol/L (ref 3.5–5.1)
Sodium: 137 mmol/L (ref 135–145)
Total Bilirubin: 0.3 mg/dL (ref 0.3–1.2)
Total Protein: 7.1 g/dL (ref 6.5–8.1)

## 2020-06-17 LAB — CBC WITH DIFFERENTIAL (CANCER CENTER ONLY)
Abs Immature Granulocytes: 0.02 10*3/uL (ref 0.00–0.07)
Basophils Absolute: 0 10*3/uL (ref 0.0–0.1)
Basophils Relative: 1 %
Eosinophils Absolute: 0.2 10*3/uL (ref 0.0–0.5)
Eosinophils Relative: 5 %
HCT: 36.6 % (ref 36.0–46.0)
Hemoglobin: 12 g/dL (ref 12.0–15.0)
Immature Granulocytes: 0 %
Lymphocytes Relative: 9 %
Lymphs Abs: 0.4 10*3/uL — ABNORMAL LOW (ref 0.7–4.0)
MCH: 30.5 pg (ref 26.0–34.0)
MCHC: 32.8 g/dL (ref 30.0–36.0)
MCV: 92.9 fL (ref 80.0–100.0)
Monocytes Absolute: 0.5 10*3/uL (ref 0.1–1.0)
Monocytes Relative: 11 %
Neutro Abs: 3.4 10*3/uL (ref 1.7–7.7)
Neutrophils Relative %: 74 %
Platelet Count: 202 10*3/uL (ref 150–400)
RBC: 3.94 MIL/uL (ref 3.87–5.11)
RDW: 14.6 % (ref 11.5–15.5)
WBC Count: 4.6 10*3/uL (ref 4.0–10.5)
nRBC: 0 % (ref 0.0–0.2)

## 2020-06-17 MED ORDER — IOHEXOL 9 MG/ML PO SOLN
500.0000 mL | ORAL | Status: AC
  Administered 2020-06-17 (×2): 500 mL via ORAL

## 2020-06-17 MED ORDER — IOHEXOL 300 MG/ML  SOLN
100.0000 mL | Freq: Once | INTRAMUSCULAR | Status: AC | PRN
  Administered 2020-06-17: 100 mL via INTRAVENOUS

## 2020-06-17 MED ORDER — IOHEXOL 9 MG/ML PO SOLN
ORAL | Status: AC
  Filled 2020-06-17: qty 1000

## 2020-06-17 NOTE — Progress Notes (Signed)
Upon doing patients CT, pt.c/o severe abdominal pain. We called Dr. Benay Spice and informed us to take the pt.to the [facility] to be evaluated. Patient was escorted to the [facility] after exam. Report was made a STAT call report.

## 2020-06-18 ENCOUNTER — Other Ambulatory Visit: Payer: Self-pay

## 2020-06-18 ENCOUNTER — Inpatient Hospital Stay (HOSPITAL_BASED_OUTPATIENT_CLINIC_OR_DEPARTMENT_OTHER): Payer: Medicare HMO | Admitting: Oncology

## 2020-06-18 ENCOUNTER — Telehealth: Payer: Self-pay | Admitting: Oncology

## 2020-06-18 VITALS — BP 131/66 | HR 71 | Temp 98.5°F | Resp 18 | Ht 62.0 in | Wt 181.2 lb

## 2020-06-18 DIAGNOSIS — C210 Malignant neoplasm of anus, unspecified: Secondary | ICD-10-CM | POA: Diagnosis not present

## 2020-06-18 DIAGNOSIS — Z23 Encounter for immunization: Secondary | ICD-10-CM | POA: Diagnosis not present

## 2020-06-18 MED ORDER — INFLUENZA VAC A&B SA ADJ QUAD 0.5 ML IM PRSY
PREFILLED_SYRINGE | INTRAMUSCULAR | Status: AC
  Filled 2020-06-18: qty 0.5

## 2020-06-18 MED ORDER — INFLUENZA VAC A&B SA ADJ QUAD 0.5 ML IM PRSY
0.5000 mL | PREFILLED_SYRINGE | Freq: Once | INTRAMUSCULAR | Status: AC
  Administered 2020-06-18: 0.5 mL via INTRAMUSCULAR

## 2020-06-18 NOTE — Progress Notes (Signed)
Ashley Moore OFFICE PROGRESS NOTE   Diagnosis: Anal cancer  INTERVAL HISTORY:   Ashley Moore returns as scheduled.  She completed Xeloda radiation in late July.  She reports feeling well.  She no longer has pain with bowel movements.  She underwent exchange of the right ureter stent on 06/12/2020.  She developed abdominal pain following the CT yesterday.  The abdominal pain improved after she urinated several times.  No complaint today.  Objective:  Vital signs in last 24 hours:  Blood pressure 131/66, pulse 71, temperature 98.5 F (36.9 C), temperature source Tympanic, resp. rate 18, height 5\' 2"  (1.575 m), weight 181 lb 3.2 oz (82.2 kg), SpO2 100 %.    HEENT: Neck without mass Lymphatics: No cervical, supraclavicular, axillary, or inguinal nodes, lymphedema in the groin bilaterally Resp: End inspiratory rales at the right greater than left lower posterior chest, no respiratory distress Cardio: Regular rate and rhythm GI: No hepatosplenomegaly, no mass, nontender Vascular: No leg edema     Lab Results:  Lab Results  Component Value Date   WBC 4.6 06/17/2020   HGB 12.0 06/17/2020   HCT 36.6 06/17/2020   MCV 92.9 06/17/2020   PLT 202 06/17/2020   NEUTROABS 3.4 06/17/2020    CMP  Lab Results  Component Value Date   NA 137 06/17/2020   K 4.1 06/17/2020   CL 104 06/17/2020   CO2 29 06/17/2020   GLUCOSE 87 06/17/2020   BUN 16 06/17/2020   CREATININE 0.70 06/17/2020   CALCIUM 9.0 06/17/2020   PROT 7.1 06/17/2020   ALBUMIN 3.5 06/17/2020   AST 23 06/17/2020   ALT 16 06/17/2020   ALKPHOS 79 06/17/2020   BILITOT 0.3 06/17/2020   GFRNONAA >60 06/17/2020   GFRAA >60 06/17/2020      Imaging:  CT Abdomen Pelvis W Contrast  Result Date: 06/17/2020 CLINICAL DATA:  Anal cancer status post chemotherapy and radiation therapy. 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 Restaging post interval therapy. EXAM: CT ABDOMEN AND PELVIS WITH CONTRAST TECHNIQUE: Multidetector CT imaging of the abdomen and pelvis was performed using the standard protocol following bolus administration of intravenous contrast. CONTRAST:  143mL OMNIPAQUE IOHEXOL 300 MG/ML  SOLN COMPARISON:  02/13/2020 PET-CT.  01/28/2020 CT abdomen/pelvis. FINDINGS: Lower chest: No significant pulmonary nodules or acute consolidative airspace disease. Hepatobiliary: Normal liver size. Subcentimeter hypodense left liver lesion is too small to characterize and is unchanged. No new liver lesions. Normal gallbladder with no radiopaque cholelithiasis. No biliary ductal dilatation. Pancreas: Normal, with no mass or duct dilation. Spleen: Normal size. No mass. Adrenals/Urinary Tract: Normal adrenals. Well-positioned right nephroureteral stent at with proximal pigtail portion in central right renal collecting system and distal tip in the bladder. Residual mild right hydronephrosis, improved. No left hydronephrosis. No renal masses. Normal bladder. Stomach/Bowel: Normal non-distended stomach. Normal caliber small bowel with no small bowel wall thickening. Normal appendix. Oral contrast transits to the colon. Mild circumferential anal wall thickening is not appreciably changed. Mild sigmoid diverticulosis. No new sites of large bowel wall thickening or acute pericolonic fat stranding. Vascular/Lymphatic: Atherosclerotic nonaneurysmal abdominal aorta. Patent portal, splenic, hepatic and renal veins. Previously visualized widespread enlarged paracaval nodes have all decreased and are no longer pathologically enlarged. No new or residual pathologically enlarged lymph nodes in the abdomen or pelvis. Reproductive: Grossly normal uterus.  No adnexal mass. Other: No pneumoperitoneum, ascites or focal fluid collection. Musculoskeletal: No aggressive appearing focal osseous  lesions. Marked lumbar spondylosis. IMPRESSION: 1. Interval  positive response to therapy. Resolved right retroperitoneal lymphadenopathy. No new or progressive metastatic disease in the abdomen or pelvis. 2. Well-positioned right nephroureteral stent. Residual mild right hydronephrosis, improved. 3. Stable mild circumferential anal wall thickening, nonspecific, potentially treatment related. 4. Mild sigmoid diverticulosis. 5. Aortic Atherosclerosis (ICD10-I70.0). Electronically Signed   By: Ilona Sorrel M.D.   On: 06/17/2020 15:05    Medications: I have reviewed the patient's current medications.   Assessment/Plan: 1. Anal cancer ? CT abdomen/pelvis 10/21/2016-thickening of the anus extending to the junction between the anus and rectum with a large stool ball in the rectum and mild fat stranding posterior to the rectum. Enlarged lymph nodes in the right and left inguinal regions. A few mildly prominent nodesseen posterior to the rectum. ? 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 ? PET scan [id number] hypermetabolic anal mass with hypermetabolic metastases to the groin region bilaterally, left pelvic sidewall and presacral space. ? Initiation of radiation and cycle 1 5-FU/mitomycin C 11/14/2016 ? Cycle 2 5-FU/mitomycin C 12/12/2016 (5-FU dose reduced due to mucositis, diarrhea, skin breakdown) ? Radiation completed 12/23/2016 ? CT abdomen/pelvis 04/14/2017-resolution of anal mass and bilateral inguinal lymphadenopathy. No residual tumor seen. ? CT abdomen/pelvis 01/28/2020-right hydroureteronephrosis, transition at the level of the mid right ureter, retroperitoneal lymphadenopathy ? CT renal stone study 01/28/2020-severe right hydronephrosis and proximal right hydroureter, 8 mm area of increased attenuation in the proximal right ureter-stone versus soft tissue mass ? PET 02/13/2020-hypermetabolic right retroperitoneal nodal metastases in the aortocaval and posterior pericaval chains, nonspecific mild anal wall hypermetabolism,  right nephroureteral stent ? 02/21/2020 anorectal exam per Dr. Ashley Jacobs radiation changes of the skin around the anal margin. Posterior midline smooth scarring. Mildly stenotic. Stenosis seems better. No palpable concerning lesions. Entire anal canal and distal rectum feels smooth and healthy. Partial anoscopy performed with no lesions in the anal canal. ? Xeloda/radiation beginning 03/02/2020, completed 04/13/2020 ? CT abdomen/pelvis 06/17/2020-resolution of right retroperitoneal lymphadenopathy, no remaining pathologically enlarged lymph nodes, residual mild right hydronephrosis, no evidence of progressive disease, stable anal wall thickening 2. Left labial lesions. Question direct extension from the anal cancer versus metastatic disease from anal cancer versus a separate malignant process. 3. History of pain and bleeding secondary to #1 and skin breakdown 4. 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 5. Multiple family members with breast cancer. 6. History of hypokalemia-likely secondary to decreased nutritional intake and diarrhea; potassium in normal range 01/16/2017. No longer taking a potassium supplement. 7. Right hydroureteronephrosis on CT 01/28/2020, status post a cystoscopy/pyelogram 01/28/2020 confirming extrinsic compression of the right ureter, status post stent placement  Right ureter stent exchange 06/12/2020    Disposition: Ashley Moore completed the course of Xeloda and radiation on 04/13/2020.  The restaging CT yesterday confirms marked improvement in the retroperitoneal lymphadenopathy.  There is no evidence of disease progression.  The plan is to follow her with observation.  She will return for an office visit and restaging CTs in approximately 4 months.  Ashley Moore will continue follow-up with Dr. Tresa Moore for management of the right ureter stent.  She received an influenza vaccine today.  She will return for a COVID-19 booster  vaccine in 2 weeks.  I reviewed the CT images with Ashley Moore.  Betsy Coder, MD  06/18/2020  11:24 AM

## 2020-06-18 NOTE — Telephone Encounter (Signed)
Scheduled per 09/30 los, patient will be notified per my chart.

## 2020-06-19 NOTE — Progress Notes (Signed)
The patient had an ultrasound done of her abdomen today.  She was having abdominal pain and requested to be seen however her abdominal pain resolved and she elected to wait until her appointment tomorrow.  Sandi Mealy, MHS, PA-C Physician Assistant

## 2020-07-02 ENCOUNTER — Telehealth: Payer: Self-pay | Admitting: Internal Medicine

## 2020-07-02 ENCOUNTER — Inpatient Hospital Stay: Payer: Medicare HMO | Attending: Gynecologic Oncology

## 2020-07-02 ENCOUNTER — Other Ambulatory Visit: Payer: Self-pay

## 2020-07-02 DIAGNOSIS — Z23 Encounter for immunization: Secondary | ICD-10-CM | POA: Diagnosis not present

## 2020-07-02 NOTE — Progress Notes (Signed)
  Chronic Care Management   Note  07/02/2020 Name: Ashley Moore MRN: [id number] DOB: Nov 06, 1950  Ashley Moore is a 69 y.o. year old female who is a primary care patient of Panosh, Standley Brooking, MD. I reached out to Reva Bores by phone today in response to a referral sent by Ashley Moore PCP, Panosh, Standley Brooking, MD.   Ashley Moore was given information about Chronic Care Management services today including:  1. CCM service includes personalized support from designated clinical staff supervised by her physician, including individualized plan of care and coordination with other care providers 2. 24/7 contact phone numbers for assistance for urgent and routine care needs. 3. Service will only be billed when office clinical staff spend 20 minutes or more in a month to coordinate care. 4. Only one practitioner may furnish and bill the service in a calendar month. 5. The patient may stop CCM services at any time (effective at the end of the month) by phone call to the office staff.   Patient agreed to services and verbal consent obtained.   Follow up plan:   Carley Perdue [employer] Scheduler

## 2020-07-02 NOTE — Progress Notes (Signed)
  Chronic Care Management   Outreach Note  07/02/2020 Name: KIERNAN FARKAS MRN: [id number] DOB: 06/14/1951  Referred by: [name], MD Reason for referral : No chief complaint on file.   An unsuccessful telephone outreach was attempted today. The patient was referred to the pharmacist for assistance with care management and care coordination.   Follow Up Plan:   Carley Perdue [employer] Scheduler

## 2020-07-06 ENCOUNTER — Other Ambulatory Visit: Payer: Self-pay | Admitting: Internal Medicine

## 2020-08-26 ENCOUNTER — Telehealth: Payer: Self-pay | Admitting: *Deleted

## 2020-08-26 DIAGNOSIS — C210 Malignant neoplasm of anus, unspecified: Secondary | ICD-10-CM | POA: Diagnosis not present

## 2020-08-26 DIAGNOSIS — C7982 Secondary malignant neoplasm of genital organs: Secondary | ICD-10-CM

## 2020-08-26 DIAGNOSIS — N3090 Cystitis, unspecified without hematuria: Secondary | ICD-10-CM

## 2020-08-26 NOTE — Telephone Encounter (Signed)
-----   Message from Viona Gilmore, [facility] sent at 08/25/2020  5:03 PM EST ----- Regarding: CCM referral Hi Apolonio Schneiders,  Can you please place a CCM referral for one of Dr. Velora Mediate patients, Ms. [name]? She is on my schedule next week.  Thank you, Maddie

## 2020-08-31 ENCOUNTER — Telehealth: Payer: Self-pay | Admitting: Pharmacist

## 2020-09-01 NOTE — Chronic Care Management (AMB) (Signed)
Chronic Care Management Pharmacy Assistant   Name: Ashley Moore  MRN: [id number] DOB: [birth year]  Reason for Encounter: Medication Review/Initial Questions for Pharmacist visit on 09-02-2020  Patient Questions: Have you seen any other providers since your last visit? No 2. Any changes in your medications or health? No 3. Any side effects from any medications? No 4. Do you have any symptoms or problems not managed by your medications?  . She states that Mirabegron ER is not working for her. 5. Any concerns about your health right now? No  6. Has your provider asked that you check blood pressure, blood sugar, or follow a special diet at home? No 7. Do you get any type of exercise regularly?   Use total gym and walking daily 8. Can you think of a goal you would like to reach for your health?  . Loss weight about 10 lbs 9. Do you have any problems getting your medications? No 10. Is there anything that you would like to discuss during the appointment? No  The patient was asked to please bring medications, blood pressure/ blood sugar log, and supplements to her appointment.   PCP : [name], MD  Allergies:   Allergies  Allergen Reactions  . Dilaudid [Hydromorphone Hcl]     Throws up  Cannot tolerate, IV or IM   . Benadryl [Diphenhydramine] Other (See Comments)    Tingle all over   . Fentanyl Other (See Comments)    Reports it makes her violent and projectile vomiting  . Melatonin     Tingle all over   . Other Diarrhea    greenbeans and citrus  . Lemon Oil Diarrhea  . Penicillins Rash    Medications: Outpatient Encounter Medications as of 08/31/2020  Medication Sig  . calcium-vitamin D (OSCAL WITH D) 500-200 MG-UNIT tablet Take 1 tablet by mouth daily.   Marland Kitchen CRANBERRY SOFT PO Take 1 each by mouth daily.  Marland Kitchen levothyroxine (SYNTHROID) 50 MCG tablet TAKE 1 TABLET BY MOUTH DAILY. SCHEDULE LAB APPOINTMENT TO RECHECK THYROID IN 2-3 MONTHS.  Marland Kitchen LORazepam (ATIVAN) 0.5  MG tablet One po every 4-6 hours prn anxiety or nausea  . meloxicam (MOBIC) 15 MG tablet TAKE 1 TABLET EVERY DAY  ( APPOINTMENT IS NEEDED  )  . mirabegron ER (MYRBETRIQ) 25 MG TB24 tablet Take 25 mg by mouth daily.   . traMADol (ULTRAM) 50 MG tablet Take 1-2 tablets (50-100 mg total) by mouth every 6 (six) hours as needed for moderate pain or severe pain. Post-operatively  . Turmeric 500 MG CAPS Take 1 capsule by mouth daily.    No facility-administered encounter medications on file as of 08/31/2020.    Current Diagnosis: Patient Active Problem List   Diagnosis Date Noted  . 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 Genetic testing 01/27/2020  . Family history of breast cancer   . Family history of lung cancer   . Family history of ovarian cancer   . Family history of non-Hodgkin's lymphoma   . 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 Port catheter in place 11/21/2016  . 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 Anal cancer ([place]) 11/03/2016  . Cystitis 05/06/2015  . Degenerative cervical disc 04/08/2015  . Trigger point of neck 03/26/2015  . Pain in joint, ankle and foot 04/23/2013  . Visit for preventive health examination 02/13/2013  . Routine gynecological examination 02/13/2013  . Family history of breast cancer in first degree relative 02/13/2013  .  Rash and nonspecific skin eruption 02/13/2013  . 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 VITAMIN D DEFICIENCY 06/25/2008  . NECK PAIN 06/02/2008  . FOOT PAIN 06/02/2008  . OSTEOPENIA 06/02/2008    Goals Addressed   None     Follow-Up:  Pharmacist Review  Maia Breslow, [place] Assistant [phone]

## 2020-09-02 ENCOUNTER — Ambulatory Visit: Payer: Medicare HMO | Admitting: Pharmacist

## 2020-09-02 DIAGNOSIS — M8580 Other specified disorders of bone density and structure, unspecified site: Secondary | ICD-10-CM

## 2020-09-02 DIAGNOSIS — R7989 Other specified abnormal findings of blood chemistry: Secondary | ICD-10-CM

## 2020-09-02 NOTE — Chronic Care Management (AMB) (Signed)
Chronic Care Management Pharmacy  Name: Ashley Moore  MRN: [id number] DOB: 12/24/1950  Initial Planning Appointment: completed 09/01/20  Initial Questions: 1. Have you seen any other providers since your last visit? n/a 2. Any changes in your medicines or health? No   Chief Complaint/ HPI  Ashley Moore,  69 y.o. , female presents for their Initial CCM visit with the clinical pharmacist via telephone due to COVID-19 Pandemic.  PCP : [name], MD  Their chronic conditions include: Hypothyroidism, osteoporosis, overactive bladder, pain  Office Visits: -01/08/20 Shanon Ace, MD: Patient presented for suspected UTI and chronic conditions follow up. Patient reports hematuria and increased frequency and dysuria. Lab results were WNL except TSH was elevated. Prescribed 50 mcg of levothyroxine and plan to recheck TSH and free T4 in 2-3 months. Prescribed Bactrim for UTI.   Consult Visit: -08/26/20 Alcide Evener, MD (Colon and Rectal clinic): Patient presented for follow up for anal squamous cell carcinoma. No changes made. Follow up in 6 months.  -06/18/20 Betsy Coder, MD (oncology): Patient presented for anal cancer follow up. CT confirmed marked improvement in the retroperitoneal lymphadenopathy. Plan is for observation and follow up CTs in 4 months.  -06/12/20 Patient admitted for cystoscopy with ureteral stent exchange.  -06/10/20 Metta Clines, DO (neurology): Patient presented for cervical spondylosis follow up. Follow up PRN.   -05/27/20 Mary Persons, PA (orthopedic surgery): Patient presented for chronic pain of right knee follow up.   -05/14/20 Mary Persons, PA (orthopedic surgery): Patient presented for chronic pain of right knee evaluation. Recommended retrying meloxicam. Plan for follow up in 4 weeks.  -05/07/20 Alexis Frock (urology): Patient presented for follow up. Unable to access notes.  -05/05/20 Shan Levans, PT (outpatient rehab): Patient presented for  PT treatment for lymphedema.  -04/23/20 Phineas Douglas (optometry): Patient presented for eye exam. Unable to access notes.  -04/13/20 Ned Card, NP (oncology): Patient presented for anal cancer follow up. Patient completed course of radiation and Xeloda today.  -03/04/20 Marcy Siren (pelvic health center): Patient presented for 6 month follow up for labial fusion.   Medications: Outpatient Encounter Medications as of 09/02/2020  Medication Sig  . acetaminophen (TYLENOL) 500 MG tablet Take 1,000 mg by mouth daily.  Marland Kitchen conjugated estrogens (PREMARIN) vaginal cream Place 1 Applicatorful vaginally daily.  Marland Kitchen levothyroxine (SYNTHROID) 50 MCG tablet TAKE 1 TABLET BY MOUTH DAILY. SCHEDULE LAB APPOINTMENT TO RECHECK THYROID IN 2-3 MONTHS.  Marland Kitchen meloxicam (MOBIC) 15 MG tablet TAKE 1 TABLET EVERY DAY ( APPOINTMENT IS NEEDED )  . mirabegron ER (MYRBETRIQ) 25 MG TB24 tablet Take 25 mg by mouth daily.   . Multiple Vitamins-Minerals (MULTIVITAMIN ADULT) CHEW Chew 1 capsule by mouth daily.  . Turmeric 500 MG CAPS Take 3 capsules by mouth daily.  . [DISCONTINUED] meloxicam (MOBIC) 15 MG tablet TAKE 1 TABLET EVERY DAY  ( APPOINTMENT IS NEEDED  )  . calcium-vitamin D (OSCAL WITH D) 500-200 MG-UNIT tablet Take 1 tablet by mouth daily.   Marland Kitchen CRANBERRY SOFT PO Take 1 each by mouth daily. (Patient not taking: Reported on 09/02/2020)  . [DISCONTINUED] LORazepam (ATIVAN) 0.5 MG tablet One po every 4-6 hours prn anxiety or nausea  . [DISCONTINUED] traMADol (ULTRAM) 50 MG tablet Take 1-2 tablets (50-100 mg total) by mouth every 6 (six) hours as needed for moderate pain or severe pain. Post-operatively (Patient not taking: Reported on 09/02/2020)   No facility-administered encounter medications on file as of 09/02/2020.   Patient reports she is having  some problems with the Mirabegron ER not working for her. She also has a stent between her kidney and bladder and gets back pain from it every once in a while, which she  takes Tylenol for. Patient also reports that she is almost incontinent and has some blood in her urine.  Patient reports she is not picky and eats everything. 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 She reports eating out about once a week. Otherwise, she eats [place] soup, lots of veggies, salmon, blueberries, bananas, bran cereal, and eggs every once in a while, chicken and veggie patties and not often beef. She loves chips and eats about 1 ounce per day and loves chocolate and eats sweets a lot.  Patient reports she sleeps like a log but does get up about 3-5 times a night to go to the bathroom but goes right back to back to sleep. She does take a nap after 9 hour shift or when watching TV. She works on Mondays and Tuesdays but she is busy stocking up on supplies the other days in the week. 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  For exercise, she uses [employer] to strengthen certain muscles and has started in the last 3 weeks since her operation. She can do about 15 and 20  reps of everything.   Current Diagnosis/Assessment:  Goals Addressed            This Visit's Progress   . Pharmacy Care Plan       CARE PLAN ENTRY (see longitudinal plan of care for additional care plan information)  Current Barriers:  . Chronic Disease Management support, education, and care coordination needs related to Hypothyroidism, Osteoporosis, Overactive Bladder, and pain   Hypothyroidism Lab Results  Component Value Date   TSH 2.84 04/17/2020    . Pharmacist Clinical Goal(s): o Over the next 180 days, patient will work with PharmD and providers to maintain TSH between 0.35 to 4.5  . Current regimen:  o Levothyroxine 50 mcg 1 tablet daily . Interventions: o Discussed taking levothyroxine consistently on an empty stomach before other medications and with water . Patient self care activities - Over the next 180 days, patient  will: o Continue current medication  Osteoporosis . Pharmacist Clinical Goal(s): o Over the next 180 days, patient will work with PharmD and providers to improve bone density . Current regimen:  . Calcium-vitamin D 500-200 mg-unit 1 tablet daily . Vitamin D 2000 units daily . Interventions: o Discussed recommendations for [phone] units of vitamin D daily and 1200 mg of calcium daily from dietary and supplemental sources. o Recommended weight-bearing and muscle strengthening exercises for building and maintaining bone density . Patient self care activities - Over the next 180 days, patient will: o Ensure she is getting adequate supplementation from vitamin D and calcium  Overactive bladder . Pharmacist Clinical Goal(s): o Over the next 180 days, patient will work with PharmD and providers to manage symptoms of overactive bladder . Current regimen:  o Myrbetriq 25 mg 1 tablet daily . Interventions: o Discussed the option to increase to 50 mg tablet daily . Patient self care activities - Over the next 180 days, patient will: o Discuss with Dr. Myrene Galas about increasing dose of Myrbetriq  Pain . Pharmacist Clinical Goal(s) o Over the next 180 days, patient will work with PharmD and providers to manage symptoms of pain . Current regimen:  . Meloxicam 15  mg 1 tablet daily . Tramadol 50 mg 1 tablet as needed . Tylenol 500 mg 2 tablets daily . Interventions: o Discussed not exceeding the maximum of 3,000 mg of Tylenol per day o Recommended diclofenac gel for joint pain as this has a lower risk of causing bleeding than meloxicam . Patient self care activities - Over the next 180 days, patient will: o Continue current medications  Medication management . Pharmacist Clinical Goal(s): o Over the next 180 days, patient will work with PharmD and providers to maintain optimal medication adherence . Current pharmacy: [facility] . Interventions o Comprehensive medication  review performed. o Continue current medication management strategy . Patient self care activities - Over the next 180 days, patient will: o Take medications as prescribed o Report any questions or concerns to PharmD and/or provider(s)  Initial goal documentation       SDOH Interventions   Flowsheet Row Most Recent Value  SDOH Interventions   Financial Strain Interventions Intervention Not Indicated  Transportation Interventions Intervention Not Indicated       Hypothyroidism   Lab Results  Component Value Date/Time   TSH 2.84 04/17/2020 01:28 PM   TSH 7.46 (H) 01/08/2020 10:08 AM   FREET4 1.3 04/17/2020 01:28 PM   FREET4 0.75 01/08/2020 10:08 AM    Patient has failed these meds in past: none Patient is currently controlled on the following medications:  . Levothyroxine 50 mcg 1 tablet daily  We discussed:  consistent administration on an empty stomach before other medications and with water  Plan  Continue current medications and control with diet and exercise   Osteoporosis   Last DEXA Scan: 07/2018    T-Score femoral neck: RFN -2.7, LFN -2.4  T-Score total hip: n/a  T-Score lumbar spine: 1.3  T-Score forearm radius: n/a  10-year probability of major osteoporotic fracture: n/a  10-year probability of hip fracture: n/a  Vit D, 25-Hydroxy  Date Value Ref Range Status  08/10/2010 48 30 - 89 ng/mL Final    Comment:    See lab report for associated comment(s)     Patient is a candidate for pharmacologic treatment due to T-Score < -2.5 in femoral neck  Patient has failed these meds in past: none Patient is currently uncontrolled on the following medications:  . Calcium-vitamin D 500-200 mg-unit 1 tablet daily . Vitamin D 2000 units daily  We discussed:  Recommend [phone] units of vitamin D daily. Recommend 1200 mg of calcium daily from dietary and supplemental sources. Recommend weight-bearing and muscle strengthening exercises for building and  maintaining bone density.; Patient does not consume much calcium in her diet as she drinks almond milk, occasionally yogurt or cheese and no nuts or seeds  Plan Patient will supplement with the recommended amounts of vitamin D and calcium. Will discuss with PCP about starting a medication based on patient's low T-score.   Overactive bladder   Patient has failed these meds in past: none Patient is currently uncontrolled on the following medications:  Marland Kitchen Myrbetriq 25 mg 1 tablet daily  We discussed:  Option to increase to 50 mg tablet - recommended to discuss with prescribing doctor   Plan Managed by Dr. Myrene Galas. Continue current medications   Pain   Patient has failed these meds in past: none Patient is currently controlled on the following medications:  Marland Kitchen Meloxicam 15 mg 1 tablet daily . Tramadol 50 mg 1 tablet as needed . Tylenol 500 mg 2 tablets daily  We discussed:  Using tramadol sparingly; maximum recommended dose of Tylenol 3,000 mg/day; recommended diclofenac gel with lower risk of bleeding and pain control  Plan  Continue current medications   Miscellaneous   Patient is currently on the following medications:  . Turmeric 750 mg 2 capsules daily  . Premarin cream insert 1 applicatorful daily  We discussed:  Increased risk of bleeding with Turmeric and meloxicam with current blood in urine -  patient agreed to decrease Turmeric to 750 mg daily   Plan Decrease Turmeric to 750 mg daily. Continue current medications   Vaccines   Reviewed and discussed patient's vaccination history.    Immunization History  Administered Date(s) Administered  . Fluad Quad(high Dose 65+) 05/23/2019, 06/18/2020  . Influenza Whole 06/25/2008, 08/10/2010  . Influenza, High Dose Seasonal PF 06/11/2015, 10/27/2016, 06/21/2017  . Influenza,inj,Quad PF,6+ Mos 06/28/2018  . PFIZER SARS-COV-2 Vaccination 11/17/2019, 12/10/2019, 07/02/2020  . Pneumococcal Conjugate-13 03/11/2015  .  Pneumococcal Polysaccharide-23 04/29/2016  . Tdap 02/13/2013  . Zoster 02/13/2013    Plan  Recommended patient receive Shingles vaccine at pharmacy.   Medication Management   Patient's preferred pharmacy is:  [facility], [place] [address] Phone: [phone] Fax: [phone]  [id number] [place], [place] - [address] [place] [address] Phone: [phone] Fax: [phone]  [place], [place] - [address] [place] [address] Phone: [phone] Fax: [phone]  Uses pill box? Yes - bi weekly Pt endorses 100% compliance  We discussed: Current pharmacy is preferred with insurance plan and patient is satisfied with pharmacy services  Plan  Continue current medication management strategy   Follow up: 6 month phone visit  [name], PharmD [place] Pharmacist [place] at [place] [phone]

## 2020-09-05 ENCOUNTER — Other Ambulatory Visit: Payer: Self-pay | Admitting: Physician Assistant

## 2020-09-09 DIAGNOSIS — R3915 Urgency of urination: Secondary | ICD-10-CM | POA: Diagnosis not present

## 2020-09-09 DIAGNOSIS — Q525 Fusion of labia: Secondary | ICD-10-CM | POA: Diagnosis not present

## 2020-09-09 DIAGNOSIS — C210 Malignant neoplasm of anus, unspecified: Secondary | ICD-10-CM | POA: Diagnosis not present

## 2020-09-09 DIAGNOSIS — R350 Frequency of micturition: Secondary | ICD-10-CM | POA: Diagnosis not present

## 2020-09-09 DIAGNOSIS — N3281 Overactive bladder: Secondary | ICD-10-CM | POA: Diagnosis not present

## 2020-09-17 ENCOUNTER — Telehealth: Payer: Self-pay | Admitting: Internal Medicine

## 2020-09-17 NOTE — Patient Instructions (Addendum)
Hi Hedda,  It was so lovely to get to meet you over the phone! Below is a summary of some of the topics we discussed. As a reminder, don't forget to decrease your dose of Turmeric to lower your risk of bleeding. Also, don't forget to look into getting your Shingles shot at the pharmacy.  Please give me a call if you have any questions or need anything before our follow up!  Best, Maddie  [name], PharmD [facility] Clinical Pharmacist [place] at [place]   Visit Information  Goals Addressed            This Visit's Progress   . Pharmacy Care Plan       CARE PLAN ENTRY (see longitudinal plan of care for additional care plan information)  Current Barriers:  . Chronic Disease Management support, education, and care coordination needs related to Hypothyroidism, Osteoporosis, Overactive Bladder, and pain   Hypothyroidism Lab Results  Component Value Date   TSH 2.84 04/17/2020    . Pharmacist Clinical Goal(s): o Over the next 180 days, patient will work with PharmD and providers to maintain TSH between 0.35 to 4.5  . Current regimen:  o Levothyroxine 50 mcg 1 tablet daily . Interventions: o Discussed taking levothyroxine consistently on an empty stomach before other medications and with water . Patient self care activities - Over the next 180 days, patient will: o Continue current medication  Osteoporosis . Pharmacist Clinical Goal(s): o Over the next 180 days, patient will work with PharmD and providers to improve bone density . Current regimen:  . Calcium-vitamin D 500-200 mg-unit 1 tablet daily . Vitamin D 2000 units daily . Interventions: o Discussed recommendations for [phone] units of vitamin D daily and 1200 mg of calcium daily from dietary and supplemental sources. o Recommended weight-bearing and muscle strengthening exercises for building and maintaining bone density . Patient self care activities - Over the next 180 days,  patient will: o Ensure she is getting adequate supplementation from vitamin D and calcium  Overactive bladder . Pharmacist Clinical Goal(s): o Over the next 180 days, patient will work with PharmD and providers to manage symptoms of overactive bladder . Current regimen:  o Myrbetriq 25 mg 1 tablet daily . Interventions: o Discussed the option to increase to 50 mg tablet daily . Patient self care activities - Over the next 180 days, patient will: o Discuss with Dr. Myrene Galas about increasing dose of Myrbetriq  Pain . Pharmacist Clinical Goal(s) o Over the next 180 days, patient will work with PharmD and providers to manage symptoms of pain . Current regimen:  . Meloxicam 15 mg 1 tablet daily . Tramadol 50 mg 1 tablet as needed . Tylenol 500 mg 2 tablets daily . Interventions: o Discussed not exceeding the maximum of 3,000 mg of Tylenol per day o Recommended diclofenac gel for joint pain as this has a lower risk of causing bleeding than meloxicam . Patient self care activities - Over the next 180 days, patient will: o Continue current medications  Medication management . Pharmacist Clinical Goal(s): o Over the next 180 days, patient will work with PharmD and providers to maintain optimal medication adherence . [facility] . Interventions o Comprehensive medication review performed. o Continue current medication management strategy . Patient self care activities - Over the next 180 days, patient will: o Take medications as prescribed o Report any questions or concerns to PharmD and/or provider(s)  Initial goal documentation  Ms. Laughter was given information about Chronic Care Management services today including:  1. CCM service includes personalized support from designated clinical staff supervised by her physician, including individualized plan of care and coordination with other care providers 2. 24/7 contact phone numbers for  assistance for urgent and routine care needs. 3. Standard insurance, coinsurance, copays and deductibles apply for chronic care management only during months in which we provide at least 20 minutes of these services. Most insurances cover these services at 100%, however patients may be responsible for any copay, coinsurance and/or deductible if applicable. This service may help you avoid the need for more expensive face-to-face services. 4. Only one practitioner may furnish and bill the service in a calendar month. 5. The patient may stop CCM services at any time (effective at the end of the month) by phone call to the office staff.  Patient agreed to services and verbal consent obtained.   The patient verbalized understanding of instructions, educational materials, and care plan provided today and agreed to receive a mailed copy of patient instructions, educational materials, and care plan.  Telephone follow up appointment with pharmacy team member scheduled for: 6 months  Viona Gilmore, [facility]  Osteoporosis  Osteoporosis is thinning and loss of density in your bones. Osteoporosis makes bones more brittle and fragile and more likely to break (fracture). Over time, osteoporosis can cause your bones to become so weak that they fracture after a minor fall. Bones in the hip, wrist, and spine are most likely to fracture due to osteoporosis. What are the causes? The exact cause of this condition is not known. What increases the risk? You may be at greater risk for osteoporosis if you:  Have a family history of the condition.  Have poor nutrition.  Use steroid medicines, such as prednisone.  Are female.  Are age 66 or older.  Smoke or have a history of smoking.  Are not physically active (are sedentary).  Are white (Caucasian) or of Asian descent.  Have a small body frame.  Take certain medicines, such as antiseizure medicines. What are the signs or symptoms? A fracture might be the  first sign of osteoporosis, especially if the fracture results from a fall or injury that usually would not cause a bone to break. Other signs and symptoms include:  Pain in the neck or low back.  Stooped posture.  Loss of height. How is this diagnosed? This condition may be diagnosed based on:  Your medical history.  A physical exam.  A bone mineral density test, also called a DXA or DEXA test (dual-energy X-ray absorptiometry test). This test uses X-rays to measure the amount of minerals in your bones. How is this treated? The goal of treatment is to strengthen your bones and lower your risk for a fracture. Treatment may involve:  Making lifestyle changes, such as: ? Including foods with more calcium and vitamin D in your diet. ? Doing weight-bearing and muscle-strengthening exercises. ? Stopping tobacco use. ? Limiting alcohol intake.  Taking medicine to slow the process of bone loss or to increase bone density.  Taking daily supplements of calcium and vitamin D.  Taking hormone replacement medicines, such as estrogen for women and testosterone for men.  Monitoring your levels of calcium and vitamin D. Follow these instructions at home:  Activity  Exercise as told by your health care provider. Ask your health care provider what exercises and activities are safe for you. You should do: ? Exercises that make you  work Museum/gallery conservator (weight-bearing exercises), such as tai chi, yoga, or walking. ? Exercises to strengthen muscles, such as lifting weights. Lifestyle  Limit alcohol intake to no more than 1 drink a day for nonpregnant women and 2 drinks a day for men. One drink equals 12 oz of beer, 5 oz of wine, or 1 oz of hard liquor.  Do not use any products that contain nicotine or tobacco, such as cigarettes and e-cigarettes. If you need help quitting, ask your health care provider. Preventing falls  Use devices to help you move around (mobility aids) as needed, such as  canes, walkers, scooters, or crutches.  Keep rooms well-lit and clutter-free.  Remove tripping hazards from walkways, including cords and throw rugs.  Install grab bars in bathrooms and safety rails on stairs.  Use rubber mats in the bathroom and other areas that are often wet or slippery.  Wear closed-toe shoes that fit well and support your feet. Wear shoes that have rubber soles or low heels.  Review your medicines with your health care provider. Some medicines can cause dizziness or changes in blood pressure, which can increase your risk of falling. General instructions  Include calcium and vitamin D in your diet. Calcium is important for bone health, and vitamin D helps your body to absorb calcium. Good sources of calcium and vitamin D include: ? Certain fatty fish, such as salmon and tuna. ? Products that have calcium and vitamin D added to them (fortified products), such as fortified cereals. ? Egg yolks. ? Cheese. ? Liver.  Take over-the-counter and prescription medicines only as told by your health care provider.  Keep all follow-up visits as told by your health care provider. This is important. Contact a health care provider if:  You have never been screened for osteoporosis and you are: ? A woman who is age 75 or older. ? A man who is age 34 or older. Get help right away if:  You fall or injure yourself. Summary  Osteoporosis is thinning and loss of density in your bones. This makes bones more brittle and fragile and more likely to break (fracture),even with minor falls.  The goal of treatment is to strengthen your bones and reduce your risk for a fracture.  Include calcium and vitamin D in your diet. Calcium is important for bone health, and vitamin D helps your body to absorb calcium.  Talk with your health care provider about screening for osteoporosis if you are a woman who is age 38 or older, or a man who is age 53 or older. This information is not intended  to replace advice given to you by your health care provider. Make sure you discuss any questions you have with your health care provider. Document Revised: 08/18/2017 Document Reviewed: 06/30/2017 Elsevier Patient Education  2020 [employer].

## 2020-09-17 NOTE — Telephone Encounter (Signed)
Left message for patient to call back and schedule Medicare Annual Wellness Visit (AWV) either virtually or in office.   Last AWV 09/25/19  please schedule at anytime with [facility] Nurse Health Advisor 1 or 2   This should be a 45 minute visit. 

## 2020-09-22 ENCOUNTER — Other Ambulatory Visit: Payer: Self-pay | Admitting: Physician Assistant

## 2020-09-30 ENCOUNTER — Telehealth: Payer: Self-pay | Admitting: *Deleted

## 2020-09-30 NOTE — Telephone Encounter (Signed)
Called to request her lab appointment for CT scan on 10/14/20. Scheduling message sent.

## 2020-10-02 DIAGNOSIS — R8279 Other abnormal findings on microbiological examination of urine: Secondary | ICD-10-CM | POA: Diagnosis not present

## 2020-10-02 DIAGNOSIS — R350 Frequency of micturition: Secondary | ICD-10-CM | POA: Diagnosis not present

## 2020-10-02 DIAGNOSIS — R3915 Urgency of urination: Secondary | ICD-10-CM | POA: Diagnosis not present

## 2020-10-02 DIAGNOSIS — N130 Hydronephrosis with ureteropelvic junction obstruction: Secondary | ICD-10-CM | POA: Diagnosis not present

## 2020-10-07 ENCOUNTER — Ambulatory Visit: Payer: Medicare HMO

## 2020-10-13 ENCOUNTER — Telehealth: Payer: Self-pay | Admitting: *Deleted

## 2020-10-13 NOTE — Telephone Encounter (Signed)
Called patient to clarify date of her positive covid test--tested positive on home kit on 10/02/20. Informed her she does not need to be re-tested to enter the [facility]--requires a 21 day quaranteen.  Rescheduled her CT for 10/28/20 at 0915/0930 with oral contrast at 0730 and 0830 that day. NPO 4 hours prior. She will pick up her contrast at [facility] on 2/7 or 2/8. Scheduling message sent for lab on 2/9 and OV few days later (prefers [place], [place], Fri).

## 2020-10-14 ENCOUNTER — Telehealth: Payer: Self-pay | Admitting: Oncology

## 2020-10-14 ENCOUNTER — Inpatient Hospital Stay: Payer: Medicare HMO

## 2020-10-14 ENCOUNTER — Ambulatory Visit (HOSPITAL_COMMUNITY): Payer: Medicare HMO

## 2020-10-14 NOTE — Telephone Encounter (Signed)
Scheduled appt per 1/25 sch msg - pt is aware of appt date and time   

## 2020-10-19 ENCOUNTER — Other Ambulatory Visit: Payer: Medicare HMO

## 2020-10-19 ENCOUNTER — Ambulatory Visit (HOSPITAL_COMMUNITY): Payer: Medicare HMO

## 2020-10-19 ENCOUNTER — Other Ambulatory Visit (HOSPITAL_COMMUNITY): Payer: Medicare HMO

## 2020-10-22 ENCOUNTER — Ambulatory Visit: Payer: Medicare HMO | Admitting: Oncology

## 2020-10-28 ENCOUNTER — Inpatient Hospital Stay: Payer: Medicare HMO | Attending: Nurse Practitioner

## 2020-10-28 ENCOUNTER — Ambulatory Visit (HOSPITAL_COMMUNITY)
Admission: RE | Admit: 2020-10-28 | Discharge: 2020-10-28 | Disposition: A | Discharge status: Home / Self Care | Payer: Medicare HMO | Source: Ambulatory Visit | Attending: Oncology | Admitting: Oncology

## 2020-10-28 ENCOUNTER — Other Ambulatory Visit: Payer: Self-pay

## 2020-10-28 DIAGNOSIS — Z8572 Personal history of non-Hodgkin lymphomas: Secondary | ICD-10-CM | POA: Diagnosis not present

## 2020-10-28 DIAGNOSIS — R350 Frequency of micturition: Secondary | ICD-10-CM | POA: Insufficient documentation

## 2020-10-28 DIAGNOSIS — N3289 Other specified disorders of bladder: Secondary | ICD-10-CM | POA: Diagnosis not present

## 2020-10-28 DIAGNOSIS — Z803 Family history of malignant neoplasm of breast: Secondary | ICD-10-CM | POA: Insufficient documentation

## 2020-10-28 DIAGNOSIS — C210 Malignant neoplasm of anus, unspecified: Secondary | ICD-10-CM | POA: Diagnosis not present

## 2020-10-28 DIAGNOSIS — K6289 Other specified diseases of anus and rectum: Secondary | ICD-10-CM | POA: Diagnosis not present

## 2020-10-28 DIAGNOSIS — C19 Malignant neoplasm of rectosigmoid junction: Secondary | ICD-10-CM | POA: Diagnosis not present

## 2020-10-28 LAB — CBC WITH DIFFERENTIAL (CANCER CENTER ONLY)
Abs Immature Granulocytes: 0.01 10*3/uL (ref 0.00–0.07)
Basophils Absolute: 0 10*3/uL (ref 0.0–0.1)
Basophils Relative: 1 %
Eosinophils Absolute: 0.1 10*3/uL (ref 0.0–0.5)
Eosinophils Relative: 4 %
HCT: 37.4 % (ref 36.0–46.0)
Hemoglobin: 11.7 g/dL — ABNORMAL LOW (ref 12.0–15.0)
Immature Granulocytes: 0 %
Lymphocytes Relative: 13 %
Lymphs Abs: 0.5 10*3/uL — ABNORMAL LOW (ref 0.7–4.0)
MCH: 27.5 pg (ref 26.0–34.0)
MCHC: 31.3 g/dL (ref 30.0–36.0)
MCV: 87.8 fL (ref 80.0–100.0)
Monocytes Absolute: 0.4 10*3/uL (ref 0.1–1.0)
Monocytes Relative: 12 %
Neutro Abs: 2.5 10*3/uL (ref 1.7–7.7)
Neutrophils Relative %: 70 %
Platelet Count: 298 10*3/uL (ref 150–400)
RBC: 4.26 MIL/uL (ref 3.87–5.11)
RDW: 14.7 % (ref 11.5–15.5)
WBC Count: 3.6 10*3/uL — ABNORMAL LOW (ref 4.0–10.5)
nRBC: 0 % (ref 0.0–0.2)

## 2020-10-28 LAB — BASIC METABOLIC PANEL - CANCER CENTER ONLY
Anion gap: 6 (ref 5–15)
BUN: 17 mg/dL (ref 8–23)
CO2: 27 mmol/L (ref 22–32)
Calcium: 9.4 mg/dL (ref 8.9–10.3)
Chloride: 104 mmol/L (ref 98–111)
Creatinine: 0.77 mg/dL (ref 0.44–1.00)
GFR, Estimated: >60 mL/min (ref >60)
Glucose, Bld: 81 mg/dL (ref 70–99)
Potassium: 4.4 mmol/L (ref 3.5–5.1)
Sodium: 137 mmol/L (ref 135–145)

## 2020-10-28 MED ORDER — IOHEXOL 300 MG/ML  SOLN
100.0000 mL | Freq: Once | INTRAMUSCULAR | Status: AC | PRN
  Administered 2020-10-28: 100 mL via INTRAVENOUS

## 2020-10-30 ENCOUNTER — Inpatient Hospital Stay: Payer: Medicare HMO | Admitting: Nurse Practitioner

## 2020-10-30 ENCOUNTER — Other Ambulatory Visit: Payer: Self-pay

## 2020-10-30 ENCOUNTER — Encounter: Payer: Self-pay | Admitting: Nurse Practitioner

## 2020-10-30 VITALS — BP 133/86 | HR 78 | Temp 97.7°F | Resp 17 | Ht 62.0 in | Wt 178.5 lb

## 2020-10-30 DIAGNOSIS — C210 Malignant neoplasm of anus, unspecified: Secondary | ICD-10-CM

## 2020-10-30 DIAGNOSIS — R350 Frequency of micturition: Secondary | ICD-10-CM | POA: Diagnosis not present

## 2020-10-30 DIAGNOSIS — Z803 Family history of malignant neoplasm of breast: Secondary | ICD-10-CM | POA: Diagnosis not present

## 2020-10-30 NOTE — Progress Notes (Signed)
[facility] OFF[facility] PROGRESS NOTE   Diagnosis: Anal cancer  INTERVAL HISTORY:   Ashley Moore returns for follow-up.  "Rough morning" with constipation and diarrhea.  She thinks this is related to her nerves.  Bowels overall have been moving regularly.  No rectal bleeding for the past month.  No pain with bowel movements.  No pain in general.  She reports a good appetite.  She continues to have frequent urination.  Objective:  Vital signs in last 24 hours:  Blood pressure 133/86, pulse 78, temperature 97.7 F (36.5 C), temperature source Tympanic, resp. rate 17, height 5\' 2"  (1.575 m), weight 178 lb 8 oz (81 kg), SpO2 100 %.    HEENT: Neck without mass. Lymphatics: No palpable cervical, supraclavicular, axillary or inguinal lymph nodes.  Lymphedema bilateral groin regions. Resp: Lungs clear bilaterally. Cardio: Regular rate and rhythm. GI: No hepatosplenomegaly.  No mass.  Radiation skin change at the perineum. Vascular: No leg edema.   Lab Results:  Lab Results  Component Value Date   WBC 3.6 (L) 10/28/2020   HGB 11.7 (L) 10/28/2020   HCT 37.4 10/28/2020   MCV 87.8 10/28/2020   PLT 298 10/28/2020   NEUTROABS 2.5 10/28/2020    Imaging:  No results found.  Medications: I have reviewed the patient's current medications.  Assessment/Plan: 1. Anal cancer ? CT abdomen/pelvis 10/21/2016-thickening of the anus extending to the junction between the anus and rectum with a large stool ball in the rectum and mild fat stranding posterior to the rectum. Enlarged lymph nodes in the right and left inguinal regions. A few mildly prominent nodesseen posterior to the rectum. ? 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 ? PET scan 46/27/[id number] hypermetabolic anal mass with hypermetabolic metastases to the groin region bilaterally, left pelvic sidewall and presacral space. ? Initiation of radiation and cycle 1 5-FU/mitomycin C  11/14/2016 ? Cycle 2 5-FU/mitomycin C 12/12/2016 (5-FU dose reduced due to mucositis, diarrhea, skin breakdown) ? Radiation completed 12/23/2016 ? CT abdomen/pelvis 04/14/2017-resolution of anal mass and bilateral inguinal lymphadenopathy. No residual tumor seen. ? CT abdomen/pelvis 01/28/2020-right hydroureteronephrosis, transition at the level of the mid right ureter, retroperitoneal lymphadenopathy ? CT renal stone study 01/28/2020-severe right hydronephrosis and proximal right hydroureter, 8 mm area of increased attenuation in the proximal right ureter-stone versus soft tissue mass ? PET 02/13/2020-hypermetabolic right retroperitoneal nodal metastases in the aortocaval and posterior pericaval chains, nonspecific mild anal wall hypermetabolism, right nephroureteral stent ? 02/21/2020 anorectal exam per Dr. Ashley Jacobs radiation changes of the skin around the anal margin. Posterior midline smooth scarring. Mildly stenotic. Stenosis seems better. No palpable concerning lesions. Entire anal canal and distal rectum feels smooth and healthy. Partial anoscopy performed with no lesions in the anal canal. ? Xeloda/radiation beginning 03/02/2020, completed 04/13/2020 ? CT abdomen/pelvis 06/17/2020-resolution of right retroperitoneal lymphadenopathy, no remaining pathologically enlarged lymph nodes, residual mild right hydronephrosis, no evidence of progressive disease, stable anal wall thickening ? Digital rectal exam by Dr. Quentin Cornwall 08/26/2020-posterior midline smooth scarring.  Mildly stenotic.  Stenosis seems better.  No palpable concerning lesions.  Anal canal feels smooth without palpable concern.  Unable to tolerate anoscopy.  Next visit in 6 months for surveillance. ? CT abdomen/pelvis 10/28/2020-no evidence of local recurrence or metastatic disease.  Stable mild rectal wall thickening.  New diffuse bladder wall thickening possibly related to prior radiation. 2. Left labial lesions. Question direct  extension from the anal cancer versus metastatic disease from anal cancer versus a separate malignant process. 3.  History of pain and bleeding secondary to #1 and skin breakdown 4. 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 5. Multiple family members with breast cancer. 6. History of hypokalemia-likely secondary to decreased nutritional intake and diarrhea; potassium in normal range 01/16/2017. No longer taking a potassium supplement. 7. Right hydroureteronephrosis on CT 01/28/2020, status post a cystoscopy/pyelogram 01/28/2020 confirming extrinsic compression of the right ureter, status post stent placement  Right ureter stent exchange 06/12/2020    Disposition: Ashley Moore appears stable.  Recent CT scan shows no recurrent/metastatic disease.  Results reviewed with her at today's visit.  She will continue surveillance anoscopy with Dr. Quentin Cornwall.  She continues follow-up with urology for the ureter stent.  We will see her in follow-up in 4 months.  Plan reviewed with Dr. Benay Spice.    Ned Card ANP/GNP-BC   10/30/2020  10:57 AM

## 2020-11-03 ENCOUNTER — Telehealth: Payer: Self-pay | Admitting: Oncology

## 2020-11-03 NOTE — Telephone Encounter (Signed)
Attempted to contact patient about rescheduled appointment. Left a detailed message regarding the changes.

## 2020-11-04 ENCOUNTER — Other Ambulatory Visit: Payer: Self-pay

## 2020-11-04 ENCOUNTER — Ambulatory Visit (HOSPITAL_COMMUNITY)
Admission: RE | Admit: 2020-11-04 | Discharge: 2020-11-04 | Disposition: A | Discharge status: Home / Self Care | Payer: Medicare HMO | Source: Ambulatory Visit | Attending: Nurse Practitioner | Admitting: Nurse Practitioner

## 2020-11-04 DIAGNOSIS — C210 Malignant neoplasm of anus, unspecified: Secondary | ICD-10-CM | POA: Insufficient documentation

## 2020-11-04 DIAGNOSIS — I2510 Atherosclerotic heart disease of native coronary artery without angina pectoris: Secondary | ICD-10-CM | POA: Diagnosis not present

## 2020-11-04 DIAGNOSIS — I700 Atherosclerosis of aorta: Secondary | ICD-10-CM | POA: Diagnosis not present

## 2020-11-04 DIAGNOSIS — C189 Malignant neoplasm of colon, unspecified: Secondary | ICD-10-CM | POA: Diagnosis not present

## 2020-11-04 DIAGNOSIS — M19011 Primary osteoarthritis, right shoulder: Secondary | ICD-10-CM | POA: Diagnosis not present

## 2020-11-10 ENCOUNTER — Telehealth: Payer: Self-pay

## 2020-11-10 NOTE — Telephone Encounter (Signed)
Second attempt made to pt to review ct scan message left for a return call   Per provider Please call patient, chest CT is negative for cancer

## 2020-11-10 NOTE — Telephone Encounter (Signed)
Called pt to per provider "Please call patient, chest CT is negative for cancer ". Unable to leave message no answering machine

## 2020-11-24 NOTE — Progress Notes (Signed)
Chief Complaint  Patient presents with  . Hearing Problem    Has worsened after cancer treatment and covid. Hearing has worsened. Having conversations is difficult.Past hearing evaluation her right ear canal is smaller. Having trouble with the right ear    HPI: Ashley Moore 70 y.o. come in for   Hearing decline .  She noted that after radiation treatment decreased hearing in both ears.  Over time improved and then worse again with other radiation.   She is under treatment for anorectal cancer. She also had COVID and may have had some symptoms worsening after that but not as dramatic is becoming to the point where it is affecting conversation where she cannot hear      but ? If with covid    She now thinks that after her last 2 CT scans her hearing got worse temporarily one had contrast the other did not.  No new other meds are correlated.  Right ear seems worse than the left. Had tinnitus in her 81s  But hearing ok . ' No family of hearing loss of significance in young age  45- 6 wound a. night club.   Was very loud however nothing recently ROS: See pertinent positives and negatives per HPI.  Past Medical History:  Diagnosis Date  . Arthritis   . Bowen's disease    excised 1992  . Cancer [facility])    rectal cancer 2018 chemo and radiation done  . Family history of breast cancer   . Family history of lung cancer   . Family history of non-Hodgkin's lymphoma   . Family history of ovarian cancer   . Hx of varicella   . Hydronephrosis    right malignant  . Lymphoma ([place]) 12/2019    Family History  Problem Relation Age of Onset  . Lung cancer Mother        lung  . Breast cancer Mother 45  . Ovarian cancer Mother 6  . Pancreatitis Father   . Breast cancer Sister 59  . Breast cancer Maternal Grandmother 79       breast   . Breast cancer Maternal Aunt 75       breast  . Melanoma Sister   . Breast cancer Sister 35  . Non-Hodgkin's lymphoma Paternal Grandmother      Social History   Socioeconomic History  . Marital status: Widowed    Spouse name: Not on file  . Number of children: 0  . Years of education: 8  . Highest education level: Associate degree: academic program  Occupational History  . 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 Smoking status: Former Smoker    Packs/day: 1.00    Years: 25.00    Pack years: 25.00    Types: Cigarettes    Quit date: 06/20/1995    Years since quitting: 25.4  . Smokeless tobacco: Never Used  Vaping Use  . Vaping Use: Never used  Substance and Sexual Activity  . Alcohol use: Yes    Alcohol/week: 0.0 standard drinks    Comment: occasional wine  . Drug use: Yes    Types: Marijuana    Comment: last time August 2021  . Sexual activity: Never  Other Topics Concern  . Not on file  Social History Narrative   H H  of 1      5 pets.   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    Tea green tea and earl gray    Moved from [place] to [place] area in 1983[place]   1 pregnancy   Husband died spring  2016 cv   Sister died 2015/04/02  Bone cancer    3 remaining sisters            Social Determinants of Health   Financial Resource Strain: Low Risk   . Difficulty of Paying Living Expenses: Not hard at all  Food Insecurity: Not on file  Transportation Needs: No Transportation Needs  . Lack of Transportation (Medical): No  . Lack of Transportation (Non-Medical): No  Physical Activity: Not on file  Stress: Not on file  Social Connections: Not on file    Outpatient Medications Prior to Visit  Medication Sig Dispense Refill  . acetaminophen (TYLENOL) 500 MG tablet Take 1,000 mg by mouth daily.    . calcium-vitamin D (OSCAL WITH D) 500-200 MG-UNIT tablet Take 1 tablet by mouth daily.     Marland Kitchen conjugated estrogens (PREMARIN) vaginal cream Place 1 Applicatorful  vaginally daily.    Marland Kitchen levothyroxine (SYNTHROID) 50 MCG tablet TAKE 1 TABLET BY MOUTH DAILY. SCHEDULE LAB APPOINTMENT TO RECHECK THYROID IN 2-3 MONTHS. 90 tablet 1  . meloxicam (MOBIC) 15 MG tablet TAKE 1 TABLET EVERY DAY ( APPOINTMENT IS NEEDED ) 90 tablet 0  . mirabegron ER (MYRBETRIQ) 25 MG TB24 tablet Take 25 mg by mouth daily.     . Multiple Vitamins-Minerals (MULTIVITAMIN ADULT) CHEW Chew 1 capsule by mouth daily.    . Turmeric 500 MG CAPS Take 3 capsules by mouth daily.    Marland Kitchen CRANBERRY SOFT PO Take 1 each by mouth daily. (Patient not taking: Reported on 09/02/2020)     No facility-administered medications prior to visit.     EXAM:  BP 124/80   Pulse 74   Temp 97.8 F (36.6 C) (Oral)   Resp 16   Ht 5\' 2"  (1.575 m)   Wt 181 lb (82.1 kg)   BMI 33.11 kg/m   Body mass index is 33.11 kg/m.  GENERAL: vitals reviewed and listed above, alert, oriented, appears well hydrated and in no acute distress HEENT: atraumatic, conjunctiva  clear, no obvious abnormalities on inspection of external nose and ears  Mod large plug removed right ear  currette and wash irritated but tm intact OP masked NECK: no obvious masses on inspection palpation  MS: moves all extremities without noticeable focal  abnormality PSYCH: pleasant and cooperative, no obvious depression or anxiety Lab Results  Component Value Date   WBC 3.6 (L) 10/28/2020   HGB 11.7 (L) 10/28/2020   HCT 37.4 10/28/2020   PLT 298 10/28/2020   GLUCOSE 81 10/28/2020   CHOL 153 01/08/2020   TRIG 66.0 01/08/2020   HDL 76.30 01/08/2020   LDLCALC 63 01/08/2020   ALT 16 06/17/2020   AST 23 06/17/2020   NA 137 10/28/2020   K 4.4 10/28/2020   CL 104 10/28/2020   CREATININE 0.77 10/28/2020   BUN 17 10/28/2020   CO2 27 10/28/2020   TSH 2.84 04/17/2020   HGBA1C 5.5 05/11/2015   BP Readings from Last 3 Encounters:  11/25/20 124/80  10/30/20 133/86  06/18/20 131/66    ASSESSMENT AND PLAN:  Discussed the following assessment and  plan:  Decreased hearing of both ears - Plan: Ambulatory referral to ENT  Impacted cerumen of  right ear  Anal cancer ([place]) - Plan: Ambulatory referral to ENT  Tinnitus, unspecified laterality - Plan: Ambulatory referral to ENT Some wax removed right ear with help but patient related worsening hearing  Over time and related to  Ct scan triggers more recently wax in eac does not seem to be enough to  Be cause of all sx .  Marland Kitchen  Advise   ent evaluation with audiology evaluation   ? If presbyacusis or other triggers that can be  Modified  Not sure what to make of her perception that is gotten worse every time she gets a CT scan. We will get opinion from ENT. revewie time spent counsel  Ear wax removal  30 minutes  -Patient advised to return or notify health care team  if  new concerns arise.  Patient Instructions   Plan  ent referral   To help assess hearing and causes . Still small amount of wax in ear .  ENT can  Remove the rest as  Needed.    Hearing Loss Hearing loss is a partial or total loss of the ability to hear. This can be temporary or permanent, and it can happen in one or both ears. Medical care is necessary to treat hearing loss properly and to prevent the condition from getting worse. Your hearing may partially or completely come back, depending on what caused your hearing loss and how severe it is. In some cases, hearing loss is permanent. What are the causes? Common causes of hearing loss include:  Too much wax in the ear canal.  Infection of the ear canal or middle ear.  Fluid in the middle ear.  Injury to the ear or surrounding area.  An object stuck in the ear.  A history of prolonged exposure to loud sounds, such as music. Less common causes of hearing loss include:  Tumors in the ear.  Viral or bacterial infections, such as meningitis.  A hole in the eardrum (perforated eardrum).  Problems with the hearing nerve that sends signals between the brain and  the ear.  Certain medicines. What are the signs or symptoms? Symptoms of this condition may include:  Difficulty telling the difference between sounds.  Difficulty following a conversation when there is background noise.  Lack of response to sounds in your environment. This may be most noticeable when you do not respond to startling sounds.  Needing to turn up the volume on the television, radio, or other devices.  Ringing in the ears.  Dizziness. How is this diagnosed? This condition is diagnosed based on:  A physical exam.  A hearing test (audiometry). The audiometry test will be performed by a hearing specialist (audiologist). You may also be referred to an ear, nose, and throat (ENT) specialist (otolaryngologist).   How is this treated? Treatment for hearing loss may include:  Ear wax removal.  Medicines to treat or prevent infection (antibiotics).  Medicines to reduce inflammation (corticosteroids).  Hearing aids for hearing loss related to nerve damage. Follow these instructions at home:  If you were prescribed an antibiotic medicine, take it as told by your health care provider. Do not stop taking the antibiotic even if you start to feel better.  Take over-the-counter and prescription medicines only as told by your health care provider.  Avoid loud noises.  Return to your normal activities as told by your health care provider. Ask your health care provider what activities are safe for you.  Keep all follow-up visits as told  by your health care provider. This is important. Contact a health care provider if:  You feel dizzy.  You develop new symptoms.  You vomit or feel nauseous.  You have a fever. Get help right away if:  You develop sudden changes in your vision.  You have severe ear pain.  You have new or increased weakness.  You have a severe headache. Summary  Hearing loss is a decreased ability to hear sounds around you. It can be temporary  or permanent.  Treatment will depend on the cause of your hearing loss. It may include ear wax removal, medicines, or a hearing aid.  Your hearing may partially or completely come back, depending on what caused your hearing loss and how severe it is.  Keep all follow-up visits as told by your health care provider. This is important. This information is not intended to replace advice given to you by your health care provider. Make sure you discuss any questions you have with your health care provider. Document Revised: 06/05/2018 Document Reviewed: 06/05/2018 Elsevier Patient Education  2021 [place]. Nyiesha Beever M.D.

## 2020-11-25 ENCOUNTER — Other Ambulatory Visit: Payer: Self-pay

## 2020-11-25 ENCOUNTER — Encounter: Payer: Self-pay | Admitting: Internal Medicine

## 2020-11-25 ENCOUNTER — Ambulatory Visit (INDEPENDENT_AMBULATORY_CARE_PROVIDER_SITE_OTHER): Payer: Medicare HMO | Admitting: Internal Medicine

## 2020-11-25 VITALS — BP 124/80 | HR 74 | Temp 97.8°F | Resp 16 | Ht 62.0 in | Wt 181.0 lb

## 2020-11-25 DIAGNOSIS — H9319 Tinnitus, unspecified ear: Secondary | ICD-10-CM | POA: Diagnosis not present

## 2020-11-25 DIAGNOSIS — C210 Malignant neoplasm of anus, unspecified: Secondary | ICD-10-CM | POA: Diagnosis not present

## 2020-11-25 DIAGNOSIS — H6121 Impacted cerumen, right ear: Secondary | ICD-10-CM

## 2020-11-25 DIAGNOSIS — H9193 Unspecified hearing loss, bilateral: Secondary | ICD-10-CM

## 2020-11-25 NOTE — Patient Instructions (Signed)
Plan  ent referral   To help assess hearing and causes . Still small amount of wax in ear .  ENT can  Remove the rest as  Needed.    Hearing Loss Hearing loss is a partial or total loss of the ability to hear. This can be temporary or permanent, and it can happen in one or both ears. Medical care is necessary to treat hearing loss properly and to prevent the condition from getting worse. Your hearing may partially or completely come back, depending on what caused your hearing loss and how severe it is. In some cases, hearing loss is permanent. What are the causes? Common causes of hearing loss include:  Too much wax in the ear canal.  Infection of the ear canal or middle ear.  Fluid in the middle ear.  Injury to the ear or surrounding area.  An object stuck in the ear.  A history of prolonged exposure to loud sounds, such as music. Less common causes of hearing loss include:  Tumors in the ear.  Viral or bacterial infections, such as meningitis.  A hole in the eardrum (perforated eardrum).  Problems with the hearing nerve that sends signals between the brain and the ear.  Certain medicines. What are the signs or symptoms? Symptoms of this condition may include:  Difficulty telling the difference between sounds.  Difficulty following a conversation when there is background noise.  Lack of response to sounds in your environment. This may be most noticeable when you do not respond to startling sounds.  Needing to turn up the volume on the television, radio, or other devices.  Ringing in the ears.  Dizziness. How is this diagnosed? This condition is diagnosed based on:  A physical exam.  A hearing test (audiometry). The audiometry test will be performed by a hearing specialist (audiologist). You may also be referred to an ear, nose, and throat (ENT) specialist (otolaryngologist).   How is this treated? Treatment for hearing loss may include:  Ear wax  removal.  Medicines to treat or prevent infection (antibiotics).  Medicines to reduce inflammation (corticosteroids).  Hearing aids for hearing loss related to nerve damage. Follow these instructions at home:  If you were prescribed an antibiotic medicine, take it as told by your health care provider. Do not stop taking the antibiotic even if you start to feel better.  Take over-the-counter and prescription medicines only as told by your health care provider.  Avoid loud noises.  Return to your normal activities as told by your health care provider. Ask your health care provider what activities are safe for you.  Keep all follow-up visits as told by your health care provider. This is important. Contact a health care provider if:  You feel dizzy.  You develop new symptoms.  You vomit or feel nauseous.  You have a fever. Get help right away if:  You develop sudden changes in your vision.  You have severe ear pain.  You have new or increased weakness.  You have a severe headache. Summary  Hearing loss is a decreased ability to hear sounds around you. It can be temporary or permanent.  Treatment will depend on the cause of your hearing loss. It may include ear wax removal, medicines, or a hearing aid.  Your hearing may partially or completely come back, depending on what caused your hearing loss and how severe it is.  Keep all follow-up visits as told by your health care provider. This is important. This information  is not intended to replace advice given to you by your health care provider. Make sure you discuss any questions you have with your health care provider. Document Revised: 06/05/2018 Document Reviewed: 06/05/2018 Elsevier Patient Education  [place].

## 2020-11-26 DIAGNOSIS — R350 Frequency of micturition: Secondary | ICD-10-CM | POA: Diagnosis not present

## 2020-11-26 DIAGNOSIS — R3915 Urgency of urination: Secondary | ICD-10-CM | POA: Diagnosis not present

## 2020-12-03 DIAGNOSIS — R350 Frequency of micturition: Secondary | ICD-10-CM | POA: Diagnosis not present

## 2020-12-03 DIAGNOSIS — R3915 Urgency of urination: Secondary | ICD-10-CM | POA: Diagnosis not present

## 2020-12-09 DIAGNOSIS — N3281 Overactive bladder: Secondary | ICD-10-CM | POA: Diagnosis not present

## 2020-12-09 DIAGNOSIS — C210 Malignant neoplasm of anus, unspecified: Secondary | ICD-10-CM | POA: Diagnosis not present

## 2020-12-09 DIAGNOSIS — R8281 Pyuria: Secondary | ICD-10-CM | POA: Diagnosis not present

## 2020-12-09 DIAGNOSIS — Q525 Fusion of labia: Secondary | ICD-10-CM | POA: Diagnosis not present

## 2020-12-09 NOTE — Progress Notes (Signed)
Subjective:   Ashley Moore is a 70 y.o. female who presents for Medicare Annual (Subsequent) preventive examination.  Attempted video visit. Connection established however no audio. Video visit changed to telephone visit.  I connected with Ashley Moore  today by telephone and verified that I am speaking with the correct person using two identifiers. Location patient: home Location provider: work Persons participating in the virtual visit: patient, provider.   I discussed the limitations, risks, security and privacy concerns of performing an evaluation and management service by telephone and the availability of in person appointments. I also discussed with the patient that there may be a patient responsible charge related to this service. The patient expressed understanding and verbally consented to this telephonic visit.    Interactive audio and video telecommunications were attempted between this provider and patient, however failed, due to patient having technical difficulties OR patient did not have access to video capability.  We continued and completed visit with audio only.       Review of Systems    N/A  Cardiac Risk Factors include: advanced age (>34men, >47 women)     Objective:    Today's Vitals   There is no height or weight on file to calculate BMI.  Advanced Directives 12/10/2020 06/12/2020 06/10/2020 02/19/2020 02/14/2020 01/28/2020 01/28/2020  Does Patient Have a Medical Advance Directive? Yes No;Yes No No No No No  Type of Advance Directive Living will;Healthcare Power of [place];Living will - - - - -  Does patient want to make changes to medical advance directive? No - Patient declined - - - - - -  Copy of [place] in Chart? No - copy requested No - copy requested - - - - -  Would patient like information on creating a medical advance directive? - - - No - Patient declined No - Patient declined No - Patient  declined No - Patient declined    Current Medications (verified) Outpatient Encounter Medications as of 12/10/2020  Medication Sig  . acetaminophen (TYLENOL) 500 MG tablet Take 1,000 mg by mouth daily.  . calcium-vitamin D (OSCAL WITH D) 500-200 MG-UNIT tablet Take 1 tablet by mouth daily.   Marland Kitchen conjugated estrogens (PREMARIN) vaginal cream Place 1 Applicatorful vaginally daily.  Marland Kitchen levothyroxine (SYNTHROID) 50 MCG tablet TAKE 1 TABLET BY MOUTH DAILY. SCHEDULE LAB APPOINTMENT TO RECHECK THYROID IN 2-3 MONTHS.  Marland Kitchen meloxicam (MOBIC) 15 MG tablet TAKE 1 TABLET EVERY DAY ( APPOINTMENT IS NEEDED )  . Methenamine-Sodium Salicylate (CYSTEX PO) Take by mouth.  . mirabegron ER (MYRBETRIQ) 25 MG TB24 tablet Take 25 mg by mouth daily.   . Multiple Vitamins-Minerals (MULTIVITAMIN ADULT) CHEW Chew 1 capsule by mouth daily.  . NON FORMULARY Total Restore  . Turmeric 500 MG CAPS Take 3 capsules by mouth daily.   No facility-administered encounter medications on file as of 12/10/2020.    Allergies (verified) Benadryl [diphenhydramine], Melatonin, Other, Lemon oil, and Penicillins   History: Past Medical History:  Diagnosis Date  . Arthritis   . Bowen's disease    excised 1992  . Cancer [facility])    rectal cancer 2018 chemo and radiation done  . Family history of breast cancer   . Family history of lung cancer   . Family history of non-Hodgkin's lymphoma   . Family history of ovarian cancer   . Hx of varicella   . Hydronephrosis    right malignant  . Lymphoma ([place]) 12/2019   Past  Surgical History:  Procedure Laterality Date  . BUNIONECTOMY Right yrs ago  . CYSTOSCOPY W/ URETERAL STENT PLACEMENT Right 06/12/2020   Procedure: CYSTOSCOPY WITH RETROGRADE PYELOGRAM/URETERAL STENT EXCHANGE;  Surgeon: Alexis Frock, MD;  Location: [facility];  Service: Urology;  Laterality: Right;  . CYSTOSCOPY WITH RETROGRADE PYELOGRAM, URETEROSCOPY AND STENT PLACEMENT Right 01/28/2020   Procedure:  CYSTOSCOPY WITH RETROGRADE PYELOGRAM, URETEROSCOPY AND STENT PLACEMENT;  Surgeon: Alexis Frock, MD;  Location: [facility];  Service: Urology;  Laterality: Right;  . IR GENERIC HISTORICAL  11/14/2016   IR US GUIDE VASC ACCESS RIGHT 11/14/2016 [facility]  . IR GENERIC HISTORICAL  11/14/2016   IR FLUORO GUIDE CV LINE RIGHT 11/14/2016 [facility]  . IR GENERIC HISTORICAL  12/12/2016   IR US GUIDE VASC ACCESS RIGHT 12/12/2016 [name], MD [facility]  . IR GENERIC HISTORICAL  12/12/2016   IR FLUORO GUIDE CV LINE RIGHT 12/12/2016 [name], MD [facility]  . SKIN CANCER EXCISION  1990   bowens disease   Family History  Problem Relation Age of Onset  . Lung cancer Mother        lung  . Breast cancer Mother 77  . Ovarian cancer Mother 45  . Pancreatitis Father   . Breast cancer Sister 57  . Breast cancer Maternal Grandmother 19       breast   . Breast cancer Maternal Aunt 75       breast  . Melanoma Sister   . Breast cancer Sister 11  . Non-Hodgkin's lymphoma Paternal Grandmother    Social History   Socioeconomic History  . Marital status: Widowed    Spouse name: Not on file  . Number of children: 0  . Years of education: 74  . Highest education level: Associate degree: academic program  Occupational History  . 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 Smoking status: Former Smoker    Packs/day: 1.00    Years: 25.00    Pack years: 25.00    Types: Cigarettes    Quit date: 06/20/1995    Years since quitting: 25.4  . Smokeless tobacco: Never Used  Vaping Use  . Vaping Use: Never used  Substance and Sexual Activity  . Alcohol use: Yes    Alcohol/week: 0.0 standard drinks    Comment: occasional wine  . Drug use: Yes    Types: Marijuana    Comment: last time August 2021  . Sexual activity: Never  Other Topics Concern  . Not on file  Social History Narrative   H H  of 1      5 pets.   She is a former smoker   Retired Forensic psychologist; Conservator, museum/gallery   [employer]   Red wine  1 per night.    Tea green tea and earl gray    Moved from [place] to [place] area in 12[place]1983   1 pregnancy   Husband died spring  2016 cv   Sister died March 30, 2015  Bone cancer    3 remaining sisters            Social Determinants of Health   Financial Resource Strain: Low Risk   . Difficulty of Paying Living Expenses: Not hard at all  Food Insecurity: No Food Insecurity  . Worried About Charity fundraiser in the Last Year: Never true  . Ran Out of Food in the  Last Year: Never true  Transportation Needs: No Transportation Needs  . Lack of Transportation (Medical): No  . Lack of Transportation (Non-Medical): No  Physical Activity: Sufficiently Active  . Days of Exercise per Week: 5 days  . Minutes of Exercise per Session: 30 min  Stress: No Stress Concern Present  . Feeling of Stress : Not at all  Social Connections: Moderately Isolated  . Frequency of Communication with Friends and Family: More than three times a week  . Frequency of Social Gatherings with Friends and Family: More than three times a week  . Attends Religious Services: 1 to 4 times per year  . Active Member of Clubs or Organizations: No  . Attends Archivist Meetings: Never  . Marital Status: Widowed    Tobacco Counseling Counseling given: Not Answered   Clinical Intake:  Pre-visit preparation completed: Yes  Pain : No/denies pain     Nutritional Risks: Nausea/ vomitting/ diarrhea (Diarrhea since last night) Diabetes: No  How often do you need to have someone help you when you read instructions, pamphlets, or other written materials from your doctor or pharmacy?: 1 - Never  Diabetic?No   Interpreter Needed?: No  Information entered by :: [place] of Daily Living In your present state of health, do you have any difficulty performing the following activities: 12/10/2020  06/12/2020  Hearing? Y -  Comment has some hearing issues -  Vision? N -  Difficulty concentrating or making decisions? Y -  Comment has some issues with concentrating at times -  Walking or climbing stairs? Y -  Comment has right knee pain -  Dressing or bathing? N -  Doing errands, shopping? N N  Preparing Food and eating ? N -  Using the Toilet? N -  In the past six months, have you accidently leaked urine? Y -  Comment has a stent and cyst in bladder. -  Do you have problems with loss of bowel control? N -  Managing your Medications? N -  Managing your Finances? N -  Housekeeping or managing your Housekeeping? N -  Some recent data might be hidden    Patient Care Team: Panosh, Standley Brooking, MD as PCP - General Inda Castle, MD (Inactive) as Attending Physician (Gastroenterology) [name], MD as Consulting Physician (Oncology) Kyung Rudd, MD as Consulting Physician (Radiation Oncology) Pieter Partridge, DO as Consulting Physician (Neurology) [name], NP as Nurse Practitioner (Nurse Practitioner) Marcy Siren, MD as Referring Physician (Obstetrics and Gynecology) Viona Gilmore, [facility] as Pharmacist (Pharmacist)  Indicate any recent Medical Services you may have received from other than [facility] providers in the past year (date may be approximate).     Assessment:   This is a routine wellness examination for Deondria.  Hearing/Vision screen  Hearing Screening   125Hz  250Hz  500Hz  1000Hz  2000Hz  3000Hz  4000Hz  6000Hz  8000Hz   Right ear:           Left ear:           Vision Screening Comments: Patient states gets annual eye exams. Wears glasses   Dietary issues and exercise activities discussed: Current Exercise Habits: Home exercise routine, Type of exercise: strength training/weights, Time (Minutes): 30, Frequency (Times/Week): 5, Weekly Exercise (Minutes/Week): 150, Intensity: Mild, Exercise limited by: None identified  Goals    . Exercise 150  minutes per week (moderate activity)     Continue with aggressive exercise x 5 days a week   Start 30 minutes;     Marland Kitchen  Patient Stated     I plan to go camping in my new mini Lucianne Lei!     . Pharmacy Care Plan     CARE PLAN ENTRY (see longitudinal plan of care for additional care plan information)  Current Barriers:  . Chronic Disease Management support, education, and care coordination needs related to Hypothyroidism, Osteoporosis, Overactive Bladder, and pain   Hypothyroidism Lab Results  Component Value Date   TSH 2.84 04/17/2020    . Pharmacist Clinical Goal(s): o Over the next 180 days, patient will work with PharmD and providers to maintain TSH between 0.35 to 4.5  . Current regimen:  o Levothyroxine 50 mcg 1 tablet daily . Interventions: o Discussed taking levothyroxine consistently on an empty stomach before other medications and with water . Patient self care activities - Over the next 180 days, patient will: o Continue current medication  Osteoporosis . Pharmacist Clinical Goal(s): o Over the next 180 days, patient will work with PharmD and providers to improve bone density . Current regimen:  . Calcium-vitamin D 500-200 mg-unit 1 tablet daily . Vitamin D 2000 units daily . Interventions: o Discussed recommendations for [phone] units of vitamin D daily and 1200 mg of calcium daily from dietary and supplemental sources. o Recommended weight-bearing and muscle strengthening exercises for building and maintaining bone density . Patient self care activities - Over the next 180 days, patient will: o Ensure she is getting adequate supplementation from vitamin D and calcium  Overactive bladder . Pharmacist Clinical Goal(s): o Over the next 180 days, patient will work with PharmD and providers to manage symptoms of overactive bladder . Current regimen:  o Myrbetriq 25 mg 1 tablet daily . Interventions: o Discussed the option to increase to 50 mg tablet daily . Patient self  care activities - Over the next 180 days, patient will: o Discuss with Dr. Myrene Galas about increasing dose of Myrbetriq  Pain . Pharmacist Clinical Goal(s) o Over the next 180 days, patient will work with PharmD and providers to manage symptoms of pain . Current regimen:  . Meloxicam 15 mg 1 tablet daily . Tramadol 50 mg 1 tablet as needed . Tylenol 500 mg 2 tablets daily . Interventions: o Discussed not exceeding the maximum of 3,000 mg of Tylenol per day o Recommended diclofenac gel for joint pain as this has a lower risk of causing bleeding than meloxicam . Patient self care activities - Over the next 180 days, patient will: o Continue current medications  Medication management . Pharmacist Clinical Goal(s): o Over the next 180 days, patient will work with PharmD and providers to maintain optimal medication adherence . Current pharmacy: [facility] . Interventions o Comprehensive medication review performed. o Continue current medication management strategy . Patient self care activities - Over the next 180 days, patient will: o Take medications as prescribed o Report any questions or concerns to PharmD and/or provider(s)  Initial goal documentation       Depression Screen PHQ 2/9 Scores 12/10/2020 11/25/2020 09/25/2019 10/24/2017 06/29/2017 02/14/2017 01/24/2017  PHQ - 2 Score 0 0 1 0 0 1 0  PHQ- 9 Score 0 0 - - - - -    Fall Risk Fall Risk  12/10/2020 06/10/2020 09/25/2019 10/09/2018 10/24/2017  Falls in the past year? 1 0 0 0 Yes  Number falls in past yr: 0 0 - - 2 or more  Comment was hit by door and fell on concrete - - - -  Injury with Fall?  1 0 - - Yes  Comment minor bruises - - - Right wrist sprain in December  Risk Factor Category  - - - - -  Risk for fall due to : - - Medication side effect - -  Follow up Falls evaluation completed;Falls prevention discussed - Falls evaluation completed;Education provided;Falls prevention discussed Falls evaluation completed  Falls evaluation completed    FALL RISK PREVENTION PERTAINING TO THE HOME:  Any stairs in or around the home? No  If so, are there any without handrails? No  Home free of loose throw rugs in walkways, pet beds, electrical cords, etc? Yes  Adequate lighting in your home to reduce risk of falls? Yes   ASSISTIVE DEVICES UTILIZED TO PREVENT FALLS:  Life alert? No  Use of a cane, walker or w/c? No  Grab bars in the bathroom? No  Shower chair or bench in shower? No  Elevated toilet seat or a handicapped toilet? No    Cognitive Function: Normal cognitive status assessed by direct observation by this Nurse Health Advisor. No abnormalities found.       6CIT Screen 09/25/2019  What Year? 0 points  What month? 0 points  What time? 0 points  Count back from 20 0 points  Months in reverse 0 points  Repeat phrase 0 points  Total Score 0    Immunizations Immunization History  Administered Date(s) Administered  . Fluad Quad(high Dose 65+) 05/23/2019, 06/18/2020  . Influenza Whole 06/25/2008, 08/10/2010  . Influenza, High Dose Seasonal PF 06/11/2015, 10/27/2016, 06/21/2017  . Influenza,inj,Quad PF,6+ Mos 06/28/2018  . PFIZER(Purple Top)SARS-COV-2 Vaccination 11/17/2019, 12/10/2019, 07/02/2020  . Pneumococcal Conjugate-13 03/11/2015  . Pneumococcal Polysaccharide-23 04/29/2016  . Tdap 02/13/2013  . Zoster 02/13/2013    TDAP status: Up to date  Flu Vaccine status: Up to date  Pneumococcal vaccine status: Up to date  Covid-19 vaccine status: Completed vaccines  Qualifies for Shingles Vaccine? Yes   Zostavax completed Yes   Shingrix Completed?: No.    Education has been provided regarding the importance of this vaccine. Patient has been advised to call insurance company to determine out of pocket expense if they have not yet received this vaccine. Advised may also receive vaccine at local pharmacy or Health Dept. Verbalized acceptance and understanding.  Screening Tests Health  Maintenance  Topic Date Due  . MAMMOGRAM  10/29/2020  . COLONOSCOPY (Pts 45-35yrs Insurance coverage will need to be confirmed)  11/05/2020  . COVID-19 Vaccine (4 - Booster for Pfizer series) 12/31/2020  . TETANUS/TDAP  02/14/2023  . INFLUENZA VACCINE  Completed  . DEXA SCAN  Completed  . Hepatitis C Screening  Completed  . PNA vac Low Risk Adult  Completed  . HPV VACCINES  Aged Out    Health Maintenance  Health Maintenance Due  Topic Date Due  . MAMMOGRAM  10/29/2020  . COLONOSCOPY (Pts 45-54yrs Insurance coverage will need to be confirmed)  11/05/2020    Colorectal Cancer Screening: Currently due, patient will speak with oncologist to see if screening is still necessary.  Mammogram status: Completed 10/30/2019. Repeat every year  Bone Density status: Ordered 12/10/2020. Pt provided with contact info and advised to call to schedule appt.  Lung Cancer Screening: (Low Dose CT Chest recommended if Age 11-80 years, 30 pack-year currently smoking OR have quit w/in 15years.) does not qualify.   Lung Cancer Screening Referral: N/A   Additional Screening:  Hepatitis C Screening: does qualify; Completed 11/07/2017  Vision Screening: Recommended annual ophthalmology exams for early  detection of glaucoma and other disorders of the eye. Is the patient up to date with their annual eye exam?  Yes  Who is the provider or what is the name of the office in which the patient attends annual eye exams? Dr. Tera Mater If pt is not established with a provider, would they like to be referred to a provider to establish care? No .   Dental Screening: Recommended annual dental exams for proper oral hygiene  Community Resource Referral / Chronic Care Management: CRR required this visit?  No   CCM required this visit?  No      Plan:     I have personally reviewed and noted the following in the patient's chart:   . Medical and social history . Use of alcohol, tobacco or illicit drugs   . Current medications and supplements . Functional ability and status . Nutritional status . Physical activity . Advanced directives . List of other physicians . Hospitalizations, surgeries, and ER visits in previous 12 months . Vitals . Screenings to include cognitive, depression, and falls . Referrals and appointments  In addition, I have reviewed and discussed with patient certain preventive protocols, quality metrics, and best practice recommendations. A written personalized care plan for preventive services as well as general preventive health recommendations were provided to patient.     Ofilia Neas, LPN   01/25/3266   Nurse Notes: None

## 2020-12-10 ENCOUNTER — Other Ambulatory Visit: Payer: Self-pay

## 2020-12-10 ENCOUNTER — Ambulatory Visit (INDEPENDENT_AMBULATORY_CARE_PROVIDER_SITE_OTHER): Payer: Medicare HMO

## 2020-12-10 DIAGNOSIS — R350 Frequency of micturition: Secondary | ICD-10-CM | POA: Diagnosis not present

## 2020-12-10 DIAGNOSIS — Z0000 Encounter for general adult medical examination without abnormal findings: Secondary | ICD-10-CM

## 2020-12-10 DIAGNOSIS — Z1231 Encounter for screening mammogram for malignant neoplasm of breast: Secondary | ICD-10-CM | POA: Diagnosis not present

## 2020-12-10 DIAGNOSIS — Z780 Asymptomatic menopausal state: Secondary | ICD-10-CM | POA: Diagnosis not present

## 2020-12-10 DIAGNOSIS — R3915 Urgency of urination: Secondary | ICD-10-CM | POA: Diagnosis not present

## 2020-12-10 NOTE — Patient Instructions (Signed)
[name] , Thank you for taking time to come for your Medicare Wellness Visit. I appreciate your ongoing commitment to your health goals. Please review the following plan we discussed and let me know if I can assist you in the future.   Screening recommendations/referrals: Colonoscopy: Currently due, please speak with your oncologist as discussed if they still recommend colonoscopy for you.  Mammogram: Currently due, orders placed this visit Bone Density: Currently due, orders placed this visit  Recommended yearly ophthalmology/optometry visit for glaucoma screening and checkup Recommended yearly dental visit for hygiene and checkup  Vaccinations: Influenza vaccine: Up to date, next due fall 2022  Pneumococcal vaccine: Completed series  Tdap vaccine: Up to date, next due 02/14/2023 Shingles vaccine: Currently due for Shingrix, You had Zostavax the older shingles vaccine in the past. You do not have to get Zostavax, however if you choose to we recommend that you do at your local pharmacy as it is less expensive.     Advanced directives: Please bring a copy of your POA (Power of Attorney) and/or Living Will to your next appointment.    Conditions/risks identified: None   Next appointment: 03/03/2021 @ 1:00 PM with Pharmacist at [facility] 65 Years and Older, Female Preventive care refers to lifestyle choices and visits with your health care provider that can promote health and wellness. What does preventive care include?  A yearly physical exam. This is also called an annual well check.  Dental exams once or twice a year.  Routine eye exams. Ask your health care provider how often you should have your eyes checked.  Personal lifestyle choices, including:  Daily care of your teeth and gums.  Regular physical activity.  Eating a healthy diet.  Avoiding tobacco and drug use.  Limiting alcohol use.  Practicing safe sex.  Taking low-dose aspirin  every day.  Taking vitamin and mineral supplements as recommended by your health care provider. What happens during an annual well check? The services and screenings done by your health care provider during your annual well check will depend on your age, overall health, lifestyle risk factors, and family history of disease. Counseling  Your health care provider may ask you questions about your:  Alcohol use.  Tobacco use.  Drug use.  Emotional well-being.  Home and relationship well-being.  Sexual activity.  Eating habits.  History of falls.  Memory and ability to understand (cognition).  Work and work Statistician.  Reproductive health. Screening  You may have the following tests or measurements:  Height, weight, and BMI.  Blood pressure.  Lipid and cholesterol levels. These may be checked every 5 years, or more frequently if you are over 72 years old.  Skin check.  Lung cancer screening. You may have this screening every year starting at age 72 if you have a 30-pack-year history of smoking and currently smoke or have quit within the past 15 years.  Fecal occult blood test (FOBT) of the stool. You may have this test every year starting at age 72.  Flexible sigmoidoscopy or colonoscopy. You may have a sigmoidoscopy every 5 years or a colonoscopy every 10 years starting at age 72.  Hepatitis C blood test.  Hepatitis B blood test.  Sexually transmitted disease (STD) testing.  Diabetes screening. This is done by checking your blood sugar (glucose) after you have not eaten for a while (fasting). You may have this done every 1-3 years.  Bone density scan. This is done to screen  for osteoporosis. You may have this done starting at age 72.  Mammogram. This may be done every 1-2 years. Talk to your health care provider about how often you should have regular mammograms. Talk with your health care provider about your test results, treatment options, and if necessary,  the need for more tests. Vaccines  Your health care provider may recommend certain vaccines, such as:  Influenza vaccine. This is recommended every year.  Tetanus, diphtheria, and acellular pertussis (Tdap, Td) vaccine. You may need a Td booster every 10 years.  Zoster vaccine. You may need this after age 72.  Pneumococcal 13-valent conjugate (PCV13) vaccine. One dose is recommended after age 71.  Pneumococcal polysaccharide (PPSV23) vaccine. One dose is recommended after age 72. Talk to your health care provider about which screenings and vaccines you need and how often you need them. This information is not intended to replace advice given to you by your health care provider. Make sure you discuss any questions you have with your health care provider. Document Released: 10/02/2015 Document Revised: 05/25/2016 Document Reviewed: 07/07/2015 Elsevier Interactive Patient Education  2017 [place] Prevention in the Home Falls can cause injuries. They can happen to people of all ages. There are many things you can do to make your home safe and to help prevent falls. What can I do on the outside of my home?  Regularly fix the edges of walkways and driveways and fix any cracks.  Remove anything that might make you trip as you walk through a door, such as a raised step or threshold.  Trim any bushes or trees on the path to your home.  Use bright outdoor lighting.  Clear any walking paths of anything that might make someone trip, such as rocks or tools.  Regularly check to see if handrails are loose or broken. Make sure that both sides of any steps have handrails.  Any raised decks and porches should have guardrails on the edges.  Have any leaves, snow, or ice cleared regularly.  Use sand or salt on walking paths during winter.  Clean up any spills in your garage right away. This includes oil or grease spills. What can I do in the bathroom?  Use night lights.  Install  grab bars by the toilet and in the tub and shower. Do not use towel bars as grab bars.  Use non-skid mats or decals in the tub or shower.  If you need to sit down in the shower, use a plastic, non-slip stool.  Keep the floor dry. Clean up any water that spills on the floor as soon as it happens.  Remove soap buildup in the tub or shower regularly.  Attach bath mats securely with double-sided non-slip rug tape.  Do not have throw rugs and other things on the floor that can make you trip. What can I do in the bedroom?  Use night lights.  Make sure that you have a light by your bed that is easy to reach.  Do not use any sheets or blankets that are too big for your bed. They should not hang down onto the floor.  Have a firm chair that has side arms. You can use this for support while you get dressed.  Do not have throw rugs and other things on the floor that can make you trip. What can I do in the kitchen?  Clean up any spills right away.  Avoid walking on wet floors.  Keep items that you  use a lot in easy-to-reach places.  If you need to reach something above you, use a strong step stool that has a grab bar.  Keep electrical cords out of the way.  Do not use floor polish or wax that makes floors slippery. If you must use wax, use non-skid floor wax.  Do not have throw rugs and other things on the floor that can make you trip. What can I do with my stairs?  Do not leave any items on the stairs.  Make sure that there are handrails on both sides of the stairs and use them. Fix handrails that are broken or loose. Make sure that handrails are as long as the stairways.  Check any carpeting to make sure that it is firmly attached to the stairs. Fix any carpet that is loose or worn.  Avoid having throw rugs at the top or bottom of the stairs. If you do have throw rugs, attach them to the floor with carpet tape.  Make sure that you have a light switch at the top of the stairs and  the bottom of the stairs. If you do not have them, ask someone to add them for you. What else can I do to help prevent falls?  Wear shoes that:  Do not have high heels.  Have rubber bottoms.  Are comfortable and fit you well.  Are closed at the toe. Do not wear sandals.  If you use a stepladder:  Make sure that it is fully opened. Do not climb a closed stepladder.  Make sure that both sides of the stepladder are locked into place.  Ask someone to hold it for you, if possible.  Clearly mark and make sure that you can see:  Any grab bars or handrails.  First and last steps.  Where the edge of each step is.  Use tools that help you move around (mobility aids) if they are needed. These include:  Canes.  Walkers.  Scooters.  Crutches.  Turn on the lights when you go into a dark area. Replace any light bulbs as soon as they burn out.  Set up your furniture so you have a clear path. Avoid moving your furniture around.  If any of your floors are uneven, fix them.  If there are any pets around you, be aware of where they are.  Review your medicines with your doctor. Some medicines can make you feel dizzy. This can increase your chance of falling. Ask your doctor what other things that you can do to help prevent falls. This information is not intended to replace advice given to you by your health care provider. Make sure you discuss any questions you have with your health care provider. Document Released: 07/02/2009 Document Revised: 02/11/2016 Document Reviewed: 10/10/2014 Elsevier Interactive Patient Education  2017 [employer].

## 2020-12-11 ENCOUNTER — Encounter: Payer: Self-pay | Admitting: Internal Medicine

## 2020-12-15 ENCOUNTER — Telehealth: Payer: Self-pay | Admitting: Pharmacist

## 2020-12-15 ENCOUNTER — Other Ambulatory Visit: Payer: Self-pay | Admitting: Urology

## 2020-12-15 DIAGNOSIS — N130 Hydronephrosis with ureteropelvic junction obstruction: Secondary | ICD-10-CM | POA: Diagnosis not present

## 2020-12-15 DIAGNOSIS — R3915 Urgency of urination: Secondary | ICD-10-CM | POA: Diagnosis not present

## 2020-12-15 NOTE — Chronic Care Management (AMB) (Signed)
    Chronic Care Management Pharmacy Assistant   Name: Ashley Moore  MRN: [id number] DOB: 1950/10/25  Reason for Encounter: General Adherence Call       Recent office visits:  . 03.24.2022 Medicare Wellness Exam . 03.09.2022 Panosh, Standley Brooking, MD Internal Medicine  Recent consult visits:  . [phone] Delanna Notice, MD Gynecology . 02.11.2021 [name], NP Hematology and Oncology . 12.22.2021 Delanna Notice, MD Gynecology  Hospital visits:  None in previous 6 months  Medications: Outpatient Encounter Medications as of 12/15/2020  Medication Sig  . acetaminophen (TYLENOL) 500 MG tablet Take 1,000 mg by mouth daily.  . calcium-vitamin D (OSCAL WITH D) 500-200 MG-UNIT tablet Take 1 tablet by mouth daily.   Marland Kitchen conjugated estrogens (PREMARIN) vaginal cream Place 1 Applicatorful vaginally daily.  Marland Kitchen levothyroxine (SYNTHROID) 50 MCG tablet TAKE 1 TABLET BY MOUTH DAILY. SCHEDULE LAB APPOINTMENT TO RECHECK THYROID IN 2-3 MONTHS.  Marland Kitchen meloxicam (MOBIC) 15 MG tablet TAKE 1 TABLET EVERY DAY ( APPOINTMENT IS NEEDED )  . Methenamine-Sodium Salicylate (CYSTEX PO) Take by mouth.  . mirabegron ER (MYRBETRIQ) 25 MG TB24 tablet Take 25 mg by mouth daily.   . Multiple Vitamins-Minerals (MULTIVITAMIN ADULT) CHEW Chew 1 capsule by mouth daily.  . NON FORMULARY Total Restore  . Turmeric 500 MG CAPS Take 3 capsules by mouth daily.   No facility-administered encounter medications on file as of 12/15/2020.   I spoke with the patient and discussed medication adherence. There are no issues currently with the current medication. She states that she has surgery at the end of the April. She states that she does not have any issues with her medication. She states she is continuing her diet and her activities. She denies any emergency department visits since her last CPP or PCP follow-up. She denies any side effects from her medication. Also, she denies any problems with her current  pharmacy.  Maia Breslow, [place] [phone]

## 2020-12-17 ENCOUNTER — Other Ambulatory Visit: Payer: Self-pay | Admitting: Internal Medicine

## 2020-12-18 DIAGNOSIS — R3915 Urgency of urination: Secondary | ICD-10-CM | POA: Diagnosis not present

## 2020-12-18 DIAGNOSIS — R350 Frequency of micturition: Secondary | ICD-10-CM | POA: Diagnosis not present

## 2020-12-24 DIAGNOSIS — R350 Frequency of micturition: Secondary | ICD-10-CM | POA: Diagnosis not present

## 2020-12-24 DIAGNOSIS — R3915 Urgency of urination: Secondary | ICD-10-CM | POA: Diagnosis not present

## 2020-12-31 DIAGNOSIS — R350 Frequency of micturition: Secondary | ICD-10-CM | POA: Diagnosis not present

## 2021-01-07 ENCOUNTER — Inpatient Hospital Stay
Admission: EM | Admit: 2021-01-07 | Discharge: 2021-01-11 | DRG: 872 | Disposition: A | Discharge status: Home / Self Care | Payer: Medicare Other | Attending: Family Medicine | Admitting: Family Medicine

## 2021-01-07 DIAGNOSIS — R8281 Pyuria: Secondary | ICD-10-CM

## 2021-01-07 DIAGNOSIS — Z880 Allergy status to penicillin: Secondary | ICD-10-CM

## 2021-01-07 DIAGNOSIS — N1330 Unspecified hydronephrosis: Secondary | ICD-10-CM

## 2021-01-07 DIAGNOSIS — Z8579 Personal history of other malignant neoplasms of lymphoid, hematopoietic and related tissues: Secondary | ICD-10-CM

## 2021-01-07 DIAGNOSIS — N12 Tubulo-interstitial nephritis, not specified as acute or chronic: Secondary | ICD-10-CM

## 2021-01-07 DIAGNOSIS — Z960 Presence of urogenital implants: Secondary | ICD-10-CM

## 2021-01-07 DIAGNOSIS — R109 Unspecified abdominal pain: Secondary | ICD-10-CM

## 2021-01-07 DIAGNOSIS — M545 Low back pain, unspecified: Secondary | ICD-10-CM

## 2021-01-07 DIAGNOSIS — R319 Hematuria, unspecified: Secondary | ICD-10-CM

## 2021-01-07 DIAGNOSIS — B961 Klebsiella pneumoniae [K. pneumoniae] as the cause of diseases classified elsewhere: Secondary | ICD-10-CM | POA: Diagnosis present

## 2021-01-07 DIAGNOSIS — N136 Pyonephrosis: Secondary | ICD-10-CM | POA: Diagnosis present

## 2021-01-07 DIAGNOSIS — Z8572 Personal history of non-Hodgkin lymphomas: Secondary | ICD-10-CM

## 2021-01-07 DIAGNOSIS — R600 Localized edema: Secondary | ICD-10-CM

## 2021-01-07 DIAGNOSIS — D649 Anemia, unspecified: Secondary | ICD-10-CM | POA: Diagnosis present

## 2021-01-07 DIAGNOSIS — E039 Hypothyroidism, unspecified: Secondary | ICD-10-CM | POA: Diagnosis present

## 2021-01-07 DIAGNOSIS — N139 Obstructive and reflux uropathy, unspecified: Secondary | ICD-10-CM | POA: Diagnosis present

## 2021-01-07 DIAGNOSIS — R7881 Bacteremia: Secondary | ICD-10-CM | POA: Diagnosis present

## 2021-01-07 DIAGNOSIS — Z91018 Allergy to other foods: Secondary | ICD-10-CM

## 2021-01-07 DIAGNOSIS — N10 Acute tubulo-interstitial nephritis: Secondary | ICD-10-CM | POA: Diagnosis present

## 2021-01-07 DIAGNOSIS — Z79890 Hormone replacement therapy (postmenopausal): Secondary | ICD-10-CM

## 2021-01-07 DIAGNOSIS — I10 Essential (primary) hypertension: Secondary | ICD-10-CM | POA: Diagnosis present

## 2021-01-07 DIAGNOSIS — N3289 Other specified disorders of bladder: Secondary | ICD-10-CM

## 2021-01-07 DIAGNOSIS — Z85048 Personal history of other malignant neoplasm of rectum, rectosigmoid junction, and anus: Secondary | ICD-10-CM

## 2021-01-07 DIAGNOSIS — A419 Sepsis, unspecified organism: Principal | ICD-10-CM | POA: Diagnosis present

## 2021-01-07 DIAGNOSIS — Z20822 Contact with and (suspected) exposure to covid-19: Secondary | ICD-10-CM | POA: Diagnosis present

## 2021-01-07 DIAGNOSIS — A414 Sepsis due to anaerobes: Secondary | ICD-10-CM

## 2021-01-07 DIAGNOSIS — R197 Diarrhea, unspecified: Secondary | ICD-10-CM | POA: Diagnosis not present

## 2021-01-07 DIAGNOSIS — R110 Nausea: Secondary | ICD-10-CM | POA: Diagnosis not present

## 2021-01-07 DIAGNOSIS — N200 Calculus of kidney: Secondary | ICD-10-CM | POA: Diagnosis not present

## 2021-01-07 DIAGNOSIS — M549 Dorsalgia, unspecified: Secondary | ICD-10-CM | POA: Diagnosis not present

## 2021-01-07 HISTORY — DX: Malignant (primary) neoplasm, unspecified: C80.1

## 2021-01-07 HISTORY — DX: Essential (primary) hypertension: I10

## 2021-01-07 HISTORY — DX: Sepsis due to anaerobes: A41.4

## 2021-01-07 LAB — CBC AND DIFFERENTIAL
Absolute NRBC: 0 10*3/uL (ref 0.00–0.00)
Basophils Absolute Automated: 0.02 10*3/uL (ref 0.00–0.08)
Basophils Automated: 0.3 %
Eosinophils Absolute Automated: 0.2 10*3/uL (ref 0.00–0.44)
Eosinophils Automated: 3.1 %
Hematocrit: 35.1 % (ref 34.7–43.7)
Hgb: 11 g/dL — ABNORMAL LOW (ref 11.4–14.8)
Immature Granulocytes Absolute: 0.02 10*3/uL (ref 0.00–0.07)
Immature Granulocytes: 0.3 %
Lymphocytes Absolute Automated: 0.67 10*3/uL (ref 0.42–3.22)
Lymphocytes Automated: 10.4 %
MCH: 27.5 pg (ref 25.1–33.5)
MCHC: 31.3 g/dL — ABNORMAL LOW (ref 31.5–35.8)
MCV: 87.8 fL (ref 78.0–96.0)
MPV: 10.3 fL (ref 8.9–12.5)
Monocytes Absolute Automated: 0.55 10*3/uL (ref 0.21–0.85)
Monocytes: 8.6 %
Neutrophils Absolute: 4.96 10*3/uL (ref 1.10–6.33)
Neutrophils: 77.3 %
Nucleated RBC: 0 /100 WBC (ref 0.0–0.0)
Platelets: 261 10*3/uL (ref 142–346)
RBC: 4 10*6/uL (ref 3.90–5.10)
RDW: 15 % (ref 11–15)
WBC: 6.42 10*3/uL (ref 3.10–9.50)

## 2021-01-07 LAB — URINALYSIS, REFLEX TO MICROSCOPIC EXAM IF INDICATED
Bilirubin, UA: NEGATIVE
Glucose, UA: NEGATIVE
Ketones UA: NEGATIVE
Nitrite, UA: POSITIVE — AB
Protein, UR: 30 — AB
Specific Gravity UA: 1.008 (ref 1.001–1.035)
Urine pH: 6 (ref 5.0–8.0)
Urobilinogen, UA: NORMAL mg/dL (ref 0.2–2.0)

## 2021-01-07 LAB — COMPREHENSIVE METABOLIC PANEL
ALT: 16 U/L (ref 0–55)
AST (SGOT): 24 U/L (ref 5–34)
Albumin/Globulin Ratio: 1.1 (ref 0.9–2.2)
Albumin: 3.6 g/dL (ref 3.5–5.0)
Alkaline Phosphatase: 75 U/L (ref 37–106)
Anion Gap: 9 (ref 5.0–15.0)
BUN: 19.6 mg/dL — ABNORMAL HIGH (ref 7.0–19.0)
Bilirubin, Total: 0.4 mg/dL (ref 0.2–1.2)
CO2: 25 mEq/L (ref 22–29)
Calcium: 9.4 mg/dL (ref 7.9–10.2)
Chloride: 101 mEq/L (ref 100–111)
Creatinine: 0.9 mg/dL (ref 0.6–1.0)
Globulin: 3.2 g/dL (ref 2.0–3.6)
Glucose: 107 mg/dL — ABNORMAL HIGH (ref 70–100)
Potassium: 4.7 mEq/L (ref 3.5–5.1)
Protein, Total: 6.8 g/dL (ref 6.0–8.3)
Sodium: 135 mEq/L — ABNORMAL LOW (ref 136–145)

## 2021-01-07 LAB — LIPASE: Lipase: 17 U/L (ref 8–78)

## 2021-01-07 LAB — GFR: EGFR: >60

## 2021-01-07 MED ORDER — FENTANYL CITRATE (PF) 50 MCG/ML IJ SOLN (WRAP)
50.0000 ug | Freq: Once | INTRAMUSCULAR | Status: AC
Start: 2021-01-07 — End: 2021-01-07
  Administered 2021-01-07: 23:00:00 50 ug via INTRAVENOUS
  Filled 2021-01-07: qty 2

## 2021-01-07 MED ORDER — FENTANYL CITRATE (PF) 50 MCG/ML IJ SOLN (WRAP)
25.0000 ug | Freq: Once | INTRAMUSCULAR | Status: AC
Start: 2021-01-07 — End: 2021-01-07
  Administered 2021-01-07: 23:00:00 25 ug via INTRAVENOUS
  Filled 2021-01-07: qty 2

## 2021-01-07 MED ORDER — SODIUM CHLORIDE 0.9 % IV BOLUS
1000.0000 mL | Freq: Once | INTRAVENOUS | Status: AC
Start: 2021-01-07 — End: 2021-01-08
  Administered 2021-01-07: 23:00:00 1000 mL via INTRAVENOUS

## 2021-01-07 MED ORDER — ONDANSETRON HCL 4 MG/2ML IJ SOLN
4.0000 mg | Freq: Once | INTRAMUSCULAR | Status: AC
Start: 2021-01-07 — End: 2021-01-07
  Administered 2021-01-07: 23:00:00 4 mg via INTRAVENOUS
  Filled 2021-01-07: qty 2

## 2021-01-07 NOTE — ED Triage Notes (Signed)
Pt BIBA with 8/10 right lower back pain and nausea starting 3 hours ago. Pt has stents in between kidney and bladder, history of bladder cysts and kidney stones. Pt has scheduled procdedure to giet stents removed or replaced. Hx of cancer that is causing her bilateral leg swelling.

## 2021-01-07 NOTE — ED Provider Notes (Addendum)
History     Chief Complaint   Patient presents with   . Flank Pain   . Nausea     Patient with 3 hours of right low back pain.  History of stent on that side for 7 months with surgery planned in the near future.  History of renal colic.  Patient took Tylenol at onset with no relief.  No other pain or syncope or trauma.  No shortness of breath.  No URI symptoms with occasional rhinorrhea due to allergies.  No nausea vomiting diarrhea constipation.  No fever or rash.  No dysuria or hematuria.    The history is provided by the patient and the EMS personnel.        Past Medical History:   Diagnosis Date   . Hypertension        History reviewed. No pertinent surgical history.    History reviewed. No pertinent family history.    Social  Social History     Tobacco Use   . Smoking status: Never Smoker   . Smokeless tobacco: Never Used   Vaping Use   . Vaping Use: Never used   Substance Use Topics   . Alcohol use: Never   . Drug use: Yes     Comment: marjauna       .     Allergies   Allergen Reactions   . Citrus    . Dilaudid [Hydromorphone]    . Penicillins        Home Medications     Med List Status: In Progress Set By: [place], RN at 01/07/2021 10:56 PM                B Complex-C-E-Zn (MYBEC PO)     Take by mouth     levothyroxine (SYNTHROID) 50 MCG tablet     Take 50 mcg by mouth Once a day at 6:00am     Meloxicam 10 MG Cap     Take 15 mg by mouth     Turmeric (QC Tumeric Complex) 500 MG Cap     Take by mouth           Review of Systems   Constitutional: Negative for activity change and fever.   HENT: Negative for congestion, rhinorrhea and sore throat.    Respiratory: Negative for cough and shortness of breath.    Cardiovascular: Negative for chest pain.   Gastrointestinal: Negative for abdominal pain, constipation, diarrhea, nausea and vomiting.   Genitourinary: Positive for flank pain. Negative for difficulty urinating and dysuria.   Musculoskeletal: Positive for back pain. Negative for neck pain.         No trauma   Skin: Negative for color change and rash.   Neurological: Negative for syncope and headaches.   All other systems reviewed and are negative.      Physical Exam    BP: 126/69, Heart Rate: 91, Temp: 97.8 F (36.6 C), Resp Rate: 22, SpO2: 99 %, Weight: 87.2 kg    Physical Exam  Vitals and nursing note reviewed.   Constitutional:       General: Tina Vazquez is in acute distress.      Appearance: Tina Vazquez is well-developed. Tina Vazquez is not ill-appearing or toxic-appearing.   HENT:      Head: Normocephalic and atraumatic.      Right Ear: External ear normal.      Left Ear: External ear normal.      Nose: Nose normal.  Mouth/Throat:      Mouth: Mucous membranes are moist.      Pharynx: Oropharynx is clear. No pharyngeal swelling, oropharyngeal exudate or posterior oropharyngeal erythema.   Eyes:      General: Lids are normal.      Extraocular Movements: Extraocular movements intact.      Conjunctiva/sclera: Conjunctivae normal.      Pupils: Pupils are equal, round, and reactive to light.   Neck:      Trachea: Trachea normal.   Cardiovascular:      Rate and Rhythm: Normal rate and regular rhythm.      Pulses:           Radial pulses are 2+ on the right side and 2+ on the left side.        Posterior tibial pulses are 2+ on the right side and 2+ on the left side.      Heart sounds: Normal heart sounds. No murmur heard.    No friction rub. No gallop.   Pulmonary:      Effort: Pulmonary effort is normal.      Breath sounds: Normal breath sounds.   Abdominal:      General: Bowel sounds are normal. There is no distension.      Palpations: Abdomen is soft.      Tenderness: There is no abdominal tenderness. There is no guarding or rebound.   Musculoskeletal:         General: Normal range of motion.      Cervical back: Full passive range of motion without pain and neck supple. No spinous process tenderness.      Thoracic back: No tenderness or bony tenderness. Normal range of motion.      Lumbar back: No tenderness or bony  tenderness. Normal range of motion.        Back:       Right lower leg: 1+ Edema present.      Left lower leg: 1+ Edema present.      Comments: Ext nt with full rom and nvi    1+ bilateral pitting edema the patient says is chronic.   Lymphadenopathy:      Cervical: No cervical adenopathy.   Skin:     General: Skin is warm and dry.      Findings: No rash.   Neurological:      Mental Status: Tina Vazquez is alert and oriented to person, place, and time.      GCS: GCS eye subscore is 4. GCS verbal subscore is 5. GCS motor subscore is 6.      Cranial Nerves: Cranial nerves are intact.      Sensory: Sensation is intact.      Motor: Motor function is intact.   Psychiatric:         Behavior: Behavior is cooperative.           MDM and ED Course     ED Medication Orders (From admission, onward)    Start Ordered     Status Ordering Provider    01/08/21 0014 01/08/21 0013  cefTRIAXone (ROCEPHIN) injection 1 g  Once        Route: Intravenous  Ordered Dose: 1 g     Last MAR action: Given Altus Zaino N    01/07/21 2304 01/07/21 2303  fentaNYL (PF) (SUBLIMAZE) injection 50 mcg  Once        Route: Intravenous  Ordered Dose: 50 mcg     Last MAR action: Given Dacoda Spallone  N    01/07/21 2230 01/07/21 2229  sodium chloride 0.9 % bolus 1,000 mL  Once        Route: Intravenous  Ordered Dose: 1,000 mL     Last MAR action: Stopped Dameion Briles N    01/07/21 2230 01/07/21 2229  fentaNYL (PF) (SUBLIMAZE) injection 25 mcg  Once        Route: Intravenous  Ordered Dose: 25 mcg     Last MAR action: Given Matheau Orona N    01/07/21 2230 01/07/21 2229  ondansetron (ZOFRAN) injection 4 mg  Once        Route: Intravenous  Ordered Dose: 4 mg     Last MAR action: Given Shakela Donati N             MDM  Number of Diagnoses or Management Options  Acute right flank pain  Acute right-sided low back pain without sciatica  Bladder wall thickening  Hematuria, unspecified type  Hydronephrosis, right  Pedal  edema  Pyelonephritis  Pyuria  Diagnosis management comments: Patient with right back/flank pain.  History of stent on that side due to renal colic.  Rule out renal colic versus other.  Work-up support and dispo pending.  Patient with baseline pedal edema.    I, Ardelle Park, MD, have been the primary provider for Tina Vazquez during this Emergency Dept visit.    Oxygen saturation by pulse oximetry is 95%-100%, Normal.  Interventions: None Needed    ppe procedure mask and safety glasses and surg cap  Pt with own mask  Family member with own mask    Lactate and ct pending- pyelo on ua with abx ordered- support- med tele admit to follow    ct pending- support- med tele admit to follow    Pt did not want iv contrast in ct- support- med tele admit pending- all dx on list and admit plan d/w pt- pt appears improved    Labs Reviewed  CBC AND DIFFERENTIAL - Abnormal; Notable for the following components:     Hgb                           11.0 (*)               MCHC                          31.3 (*)            All other components within normal limits  COMPREHENSIVE METABOLIC PANEL - Abnormal; Notable for the following components:     Glucose                       107 (*)                BUN                           19.6 (*)               Sodium                        135 (*)             All other components within normal limits  URINALYSIS, REFLEX TO MICROSCOPIC EXAM IF INDICATED - Abnormal; Notable for the following components:  Clarity, UA                   Turbid (*)               Leukocyte Esterase, UA        Large (*)               Nitrite, UA                   Positive (*)               Protein, UR                   30 (*)                 Blood, UA                     Large (*)               RBC, UA                       TNTC (*)               WBC, UA                       TNTC (*)            All other components within normal limits  CULTURE BLOOD AEROBIC AND ANAEROBIC         Narrative: 1 BLUE+1  PURPLE  CULTURE BLOOD AEROBIC AND ANAEROBIC         Narrative: 1 BLUE+1 PURPLE  URINE CULTURE         Narrative: Indications for Urine Culture:->Other (please specify in                  Comments)  LIPASE  GFR  LACTIC ACID, PLASMA    Radiology Results (24 Hour)     Procedure Component Value Units Date/Time    CT Abdomen Pelvis WO Contrast ([id number]) Collected: 01/08/21 0141    Order Status: Completed Updated: 01/08/21 0147    Narrative:      HISTORY: Right flank pain    TECHNIQUE: Helical CT was performed of abdomen and pelvis without IV or  oral contrast.      CT ABDOMEN AND PELVIS WITHOUT CONTRAST:   There are bibasilar atelectasis. Stent is seen within right ureter.  There is mild hydronephrosis and perinephric stranding of right kidney.  Mild asymmetric wall thickening is seen along right lateral bladder  measuring up to 1 cm in thickness. There is no radioopaque stones seen  overlying the kidneys bilaterally.  There is no left-sided  hydronephrosis or hydroureter.  Limited evaluation of the solid  abdominal organs on this non-enhanced CT shows no gross mass.  There is  no evidence of intestinal obstruction.       Impression:           1. Right ureteral stent seen in place with mild hydronephrosis and  perinephric stranding of right kidney  2. Asymmetric right lateral bladder wall thickening of unclear etiology.  Clinical correlation recommended.    Kristine Linea, MD   01/08/2021 1:44 AM      EKG Interpretation:    Rhythm:  Sinus Tachycardia  Rate:  Tachycardic, 109  Axis:  Right  Conduction:  No blocks  ST/T Segments:  Non  spec st/t changes  Other: no old ekg found in epic- ? Lead reversal- repeat ordered       Amount and/or Complexity of Data Reviewed  Clinical lab tests: reviewed and ordered  Tests in the radiology section of CPT: reviewed and ordered  Discuss the patient with other providers: yes (Dr Doyne Keel recc hospitalist admit    Dr Ricka Burdock to admit to med)                     Procedures    Clinical  Impression & Disposition     Clinical Impression  Final diagnoses:   Acute right flank pain   Acute right-sided low back pain without sciatica   Pedal edema   Pyelonephritis   Hematuria, unspecified type   Pyuria   Hydronephrosis, right   Bladder wall thickening        ED Disposition     ED Disposition   Admit to [facility]    Condition   --    Date/Time   Fri Jan 08, 2021 12:12 AM    Comment   --              New Prescriptions    No medications on file                 Ardelle Park, MD  01/08/21 0230       Ardelle Park, MD  01/08/21 [phone]

## 2021-01-08 ENCOUNTER — Encounter: Payer: Self-pay | Admitting: Internal Medicine

## 2021-01-08 ENCOUNTER — Emergency Department: Payer: Medicare Other

## 2021-01-08 ENCOUNTER — Inpatient Hospital Stay: Payer: Medicare Other

## 2021-01-08 DIAGNOSIS — R6510 Systemic inflammatory response syndrome (SIRS) of non-infectious origin without acute organ dysfunction: Secondary | ICD-10-CM

## 2021-01-08 DIAGNOSIS — Z960 Presence of urogenital implants: Secondary | ICD-10-CM

## 2021-01-08 DIAGNOSIS — Z8579 Personal history of other malignant neoplasms of lymphoid, hematopoietic and related tissues: Secondary | ICD-10-CM

## 2021-01-08 DIAGNOSIS — Z01818 Encounter for other preprocedural examination: Secondary | ICD-10-CM

## 2021-01-08 DIAGNOSIS — N139 Obstructive and reflux uropathy, unspecified: Secondary | ICD-10-CM

## 2021-01-08 DIAGNOSIS — Z85048 Personal history of other malignant neoplasm of rectum, rectosigmoid junction, and anus: Secondary | ICD-10-CM

## 2021-01-08 DIAGNOSIS — R000 Tachycardia, unspecified: Secondary | ICD-10-CM

## 2021-01-08 DIAGNOSIS — Z8572 Personal history of non-Hodgkin lymphomas: Secondary | ICD-10-CM

## 2021-01-08 DIAGNOSIS — D649 Anemia, unspecified: Secondary | ICD-10-CM

## 2021-01-08 DIAGNOSIS — N10 Acute tubulo-interstitial nephritis: Secondary | ICD-10-CM | POA: Diagnosis present

## 2021-01-08 DIAGNOSIS — I10 Essential (primary) hypertension: Secondary | ICD-10-CM | POA: Diagnosis present

## 2021-01-08 DIAGNOSIS — Z880 Allergy status to penicillin: Secondary | ICD-10-CM | POA: Diagnosis not present

## 2021-01-08 DIAGNOSIS — R7881 Bacteremia: Secondary | ICD-10-CM | POA: Diagnosis not present

## 2021-01-08 DIAGNOSIS — N12 Tubulo-interstitial nephritis, not specified as acute or chronic: Secondary | ICD-10-CM | POA: Diagnosis not present

## 2021-01-08 DIAGNOSIS — N132 Hydronephrosis with renal and ureteral calculous obstruction: Secondary | ICD-10-CM | POA: Diagnosis not present

## 2021-01-08 DIAGNOSIS — B961 Klebsiella pneumoniae [K. pneumoniae] as the cause of diseases classified elsewhere: Secondary | ICD-10-CM | POA: Diagnosis not present

## 2021-01-08 DIAGNOSIS — R319 Hematuria, unspecified: Secondary | ICD-10-CM | POA: Diagnosis not present

## 2021-01-08 DIAGNOSIS — R8281 Pyuria: Secondary | ICD-10-CM | POA: Diagnosis not present

## 2021-01-08 DIAGNOSIS — E039 Hypothyroidism, unspecified: Secondary | ICD-10-CM | POA: Diagnosis not present

## 2021-01-08 DIAGNOSIS — A419 Sepsis, unspecified organism: Secondary | ICD-10-CM | POA: Diagnosis not present

## 2021-01-08 DIAGNOSIS — N1330 Unspecified hydronephrosis: Secondary | ICD-10-CM | POA: Diagnosis not present

## 2021-01-08 DIAGNOSIS — Z91018 Allergy to other foods: Secondary | ICD-10-CM | POA: Diagnosis not present

## 2021-01-08 DIAGNOSIS — T83592A Infection and inflammatory reaction due to indwelling ureteral stent, initial encounter: Secondary | ICD-10-CM | POA: Diagnosis not present

## 2021-01-08 DIAGNOSIS — R918 Other nonspecific abnormal finding of lung field: Secondary | ICD-10-CM | POA: Diagnosis not present

## 2021-01-08 DIAGNOSIS — N136 Pyonephrosis: Secondary | ICD-10-CM | POA: Diagnosis not present

## 2021-01-08 DIAGNOSIS — Z79890 Hormone replacement therapy (postmenopausal): Secondary | ICD-10-CM | POA: Diagnosis not present

## 2021-01-08 HISTORY — DX: Bacteremia: R78.81

## 2021-01-08 LAB — B-TYPE NATRIURETIC PEPTIDE: B-Natriuretic Peptide: 42.6 pg/mL (ref 0.0–100.0)

## 2021-01-08 LAB — CBC AND DIFFERENTIAL
Absolute NRBC: 0 10*3/uL (ref 0.00–0.00)
Basophils Absolute Automated: 0 10*3/uL (ref 0.00–0.08)
Basophils Automated: 0 %
Eosinophils Absolute Automated: 0.04 10*3/uL (ref 0.00–0.44)
Eosinophils Automated: 0.5 %
Hematocrit: 29.9 % — ABNORMAL LOW (ref 34.7–43.7)
Hgb: 9.6 g/dL — ABNORMAL LOW (ref 11.4–14.8)
Immature Granulocytes Absolute: 0.04 10*3/uL (ref 0.00–0.07)
Immature Granulocytes: 0.5 %
Lymphocytes Absolute Automated: 0.18 10*3/uL — ABNORMAL LOW (ref 0.42–3.22)
Lymphocytes Automated: 2.4 %
MCH: 27.8 pg (ref 25.1–33.5)
MCHC: 32.1 g/dL (ref 31.5–35.8)
MCV: 86.7 fL (ref 78.0–96.0)
MPV: 10.2 fL (ref 8.9–12.5)
Monocytes Absolute Automated: 0.48 10*3/uL (ref 0.21–0.85)
Monocytes: 6.4 %
Neutrophils Absolute: 6.72 10*3/uL — ABNORMAL HIGH (ref 1.10–6.33)
Neutrophils: 90.2 %
Nucleated RBC: 0 /100 WBC (ref 0.0–0.0)
Platelets: 194 10*3/uL (ref 142–346)
RBC: 3.45 10*6/uL — ABNORMAL LOW (ref 3.90–5.10)
RDW: 15 % (ref 11–15)
WBC: 7.46 10*3/uL (ref 3.10–9.50)

## 2021-01-08 LAB — COMPREHENSIVE METABOLIC PANEL
ALT: 12 U/L (ref 0–55)
AST (SGOT): 21 U/L (ref 5–34)
Albumin/Globulin Ratio: 1 (ref 0.9–2.2)
Albumin: 2.8 g/dL — ABNORMAL LOW (ref 3.5–5.0)
Alkaline Phosphatase: 64 U/L (ref 37–106)
Anion Gap: 6 (ref 5.0–15.0)
BUN: 16.9 mg/dL (ref 7.0–19.0)
Bilirubin, Total: 0.2 mg/dL (ref 0.2–1.2)
CO2: 23 mEq/L (ref 22–29)
Calcium: 8.5 mg/dL (ref 7.9–10.2)
Chloride: 107 mEq/L (ref 100–111)
Creatinine: 0.8 mg/dL (ref 0.6–1.0)
Globulin: 2.7 g/dL (ref 2.0–3.6)
Glucose: 114 mg/dL — ABNORMAL HIGH (ref 70–100)
Potassium: 4.1 mEq/L (ref 3.5–5.1)
Protein, Total: 5.5 g/dL — ABNORMAL LOW (ref 6.0–8.3)
Sodium: 136 mEq/L (ref 136–145)

## 2021-01-08 LAB — ECG 12-LEAD
Atrial Rate: 109 {beats}/min
P-R Interval: 204 ms
Q-T Interval: 334 ms
QRS Duration: 90 ms
QTC Calculation (Bezet): 449 ms
R Axis: 170 degrees
T Axis: 161 degrees
Ventricular Rate: 109 {beats}/min

## 2021-01-08 LAB — LACTIC ACID, PLASMA
Lactic Acid: 1.1 mmol/L (ref 0.2–2.0)
Lactic Acid: 1.6 mmol/L (ref 0.2–2.0)
Lactic Acid: 1 mmol/L (ref 0.2–2.0)

## 2021-01-08 LAB — PT AND APTT
PT INR: 1.2 — ABNORMAL HIGH (ref 0.9–1.1)
PT: 14.7 s — ABNORMAL HIGH (ref 10.1–12.9)
PTT: 33 s (ref 27–39)

## 2021-01-08 LAB — MAGNESIUM: Magnesium: 1.7 mg/dL (ref 1.6–2.6)

## 2021-01-08 LAB — GFR: EGFR: >60

## 2021-01-08 MED ORDER — LEVOTHYROXINE SODIUM 50 MCG PO TABS
50.0000 ug | ORAL_TABLET | Freq: Every day | ORAL | Status: DC
Start: 2021-01-08 — End: 2021-01-11
  Administered 2021-01-08 – 2021-01-11 (×4): 50 ug via ORAL
  Filled 2021-01-08 (×7): qty 1

## 2021-01-08 MED ORDER — RISAQUAD PO CAPS
1.0000 | ORAL_CAPSULE | Freq: Every day | ORAL | Status: DC
Start: 2021-01-08 — End: 2021-01-11
  Administered 2021-01-08 – 2021-01-11 (×4): 1 via ORAL
  Filled 2021-01-08 (×3): qty 1

## 2021-01-08 MED ORDER — ONDANSETRON HCL 4 MG/2ML IJ SOLN
4.0000 mg | Freq: Four times a day (QID) | INTRAMUSCULAR | Status: DC | PRN
Start: 2021-01-08 — End: 2021-01-11

## 2021-01-08 MED ORDER — SODIUM CHLORIDE 0.9 % IV MBP
1.0000 g | INTRAVENOUS | Status: DC
Start: 2021-01-09 — End: 2021-01-10
  Administered 2021-01-09 – 2021-01-10 (×2): 1 g via INTRAVENOUS
  Filled 2021-01-08 (×2): qty 1000

## 2021-01-08 MED ORDER — OXYCODONE HCL 5 MG PO TABS
5.0000 mg | ORAL_TABLET | ORAL | Status: DC | PRN
Start: 2021-01-08 — End: 2021-01-11
  Administered 2021-01-09 – 2021-01-11 (×7): 5 mg via ORAL
  Filled 2021-01-08 (×7): qty 1

## 2021-01-08 MED ORDER — CEFTRIAXONE SODIUM 1 G IJ SOLR
1.0000 g | Freq: Once | INTRAMUSCULAR | Status: AC
Start: 2021-01-08 — End: 2021-01-08
  Administered 2021-01-08: 01:00:00 1 g via INTRAVENOUS
  Filled 2021-01-08: qty 1000

## 2021-01-08 MED ORDER — SODIUM CHLORIDE 0.9 % IV SOLN
INTRAVENOUS | Status: DC
Start: 2021-01-08 — End: 2021-01-11

## 2021-01-08 MED ORDER — ACETAMINOPHEN 325 MG PO TABS
650.0000 mg | ORAL_TABLET | ORAL | Status: DC | PRN
Start: 2021-01-08 — End: 2021-01-09
  Administered 2021-01-08 – 2021-01-09 (×6): 650 mg via ORAL
  Filled 2021-01-08 (×6): qty 2

## 2021-01-08 MED ORDER — SODIUM CHLORIDE 0.9 % IV MBP
1.0000 g | INTRAVENOUS | Status: DC
Start: 2021-01-09 — End: 2021-01-08

## 2021-01-08 MED ORDER — MORPHINE SULFATE 2 MG/ML IJ/IV SOLN (WRAP)
2.0000 mg | Status: DC | PRN
Start: 2021-01-08 — End: 2021-01-11
  Administered 2021-01-08: 2 mg via INTRAVENOUS
  Filled 2021-01-08 (×2): qty 1

## 2021-01-08 MED ORDER — SODIUM CHLORIDE 0.9 % IV MBP
1000.0000 mg | Freq: Three times a day (TID) | INTRAVENOUS | Status: DC
Start: 2021-01-08 — End: 2021-01-08
  Administered 2021-01-08: 08:00:00 1000 mg via INTRAVENOUS
  Filled 2021-01-08 (×3): qty 1000

## 2021-01-08 NOTE — Progress Notes (Signed)
01/08/21 0541   Vital Signs   Level of Consciousness Alert   Temp (!) 100.6 F (38.1 C)   Temp Source Temporal   Heart Rate (!) 117   Resp Rate (!) 24   BP 120/70   Patient Position Lying   Oxygen Therapy   SpO2 97 %   O2 Device None (Room air)   Height and Weight   Weight 88.6 kg (195 lb 5.2 oz)   Weight Method Bed Scale       Hospitalist made aware of Mews score of 5; Has placed new orders    Pt arrived to unit via stretcher and was transferred to bed with tap system. Pt was oriented to unit and room. Fall precautions were put in place. Called monitor room to confirm rhythm. Pain and head to toe assessment was completed. Skin assessment was completed with second RN.

## 2021-01-08 NOTE — ED Notes (Signed)
[facility]  ED NURSING NOTE FOR THE RECEIVING INPATIENT NURSE   ED NURSE Rubin Payor [phone]   ED CHARGE RN Misty Stanley   ADMISSION INFORMATION   Tina Vazquez is a 70 y.o. female admitted with an ED diagnosis of:    1. Pyelonephritis    2. Acute right flank pain    3. Acute right-sided low back pain without sciatica    4. Pedal edema    5. Hematuria, unspecified type    6. Pyuria         Isolation: None   Allergies: Citrus, Dilaudid [hydromorphone], and Penicillins   Holding Orders confirmed? No   Belongings Documented? No   Home medications sent to pharmacy confirmed? No   NURSING CARE   Patient Comes From:   Mental Status: Home Independent  alert and oriented   ADL: Independent with all ADLs   Ambulation: mild difficulty   Pertinent Information  and Safety Concerns: N/A     CT / NIH   CT Head ordered on this patient?  No   NIH/Dysphagia assessment done prior to admission? No   VITAL SIGNS (at the time of this note)      Vitals:    01/08/21 0000   BP: 134/70   Pulse: 90   Resp:    Temp:    SpO2: 99%

## 2021-01-08 NOTE — H&P (Signed)
[facility] H&P Note    Patient Info:   Date/Time: 01/08/2021 / 3:00 AM   Admit Date:01/07/2021  Patient Name:Tina Vazquez   ZOX:[id number]   PCP: Patsy Lager, MD  Attending Physician: Dorthula Nettles, MD  Assessment/Plan:   1.  Acute pyelonephritis with SIRS, obstructive uropathy and a right ureter stent in place: Continue IV antibiotics with meropenem, blood cultures and urine cultures pending, continue IV fluid hydration, monitor on telemetry, keep n.p.o. for now, repeat labs in a.m., consult urology in a.m.  2.  Hypertension: Blood pressure stable off medications  3.  Hypothyroid: Continue Synthroid  4.  Normocytic anemia: Hemoglobin stable, continue to monitor  5.  History of anal cancer and lymphoma: Currently in remission    DVT Prohylaxis:SEDs   Code Status: No Order  Disposition:home  Type of Admission: Inpatient  Estimated Length of Stay (including stay in the ER receiving treatment): More than 2 days  Milestones required for discharge: Acute pyelonephritis resolved  Clinical Information and History:   Chief Complaint:  Chief Complaint   Patient presents with   . Flank Pain   . Nausea     Past Medical History:  Past Medical History:   Diagnosis Date   . Hypertension      Past Surgical History:History reviewed. No pertinent surgical history.  Family History:History reviewed. No pertinent family history.  Social History:  Social History     Substance and Sexual Activity   Alcohol Use Never     Social History     Substance and Sexual Activity   Drug Use Yes    Comment: marjauna     Social History     Tobacco Use   Smoking Status Never Smoker   Smokeless Tobacco Never Used     Social History     Socioeconomic History   . Marital status: Single     Spouse name: None   . Number of children: None   . Years of education: None   . Highest education level: None   Occupational History   . None   Tobacco Use   . Smoking status: Never Smoker   . Smokeless tobacco: Never Used   Vaping Use   . Vaping  Use: Never used   Substance and Sexual Activity   . Alcohol use: Never   . Drug use: Yes     Comment: marjauna   . Sexual activity: None   Other Topics Concern   . None   Social History Narrative   . None     Social Determinants of Health     Financial Resource Strain: Not on file   Food Insecurity: Not on file   Transportation Needs: Not on file   Physical Activity: Not on file   Stress: Not on file   Social Connections: Not on file   Intimate Partner Violence: Not on file   Housing Stability: Not on file     Allergies:  Allergies   Allergen Reactions   . Citrus    . Dilaudid [Hydromorphone]    . Penicillins      Medications:(Not in a hospital admission)    Clinical Presentation: HPI:   Tina Vazquez is a 70 y.o. female who has history of History reviewed. No pertinent surgical history.   Past Medical History:   Diagnosis Date   . Hypertension     came with the chief complaint of right flank since 7 PM tonight. Patient symptoms initially started several days ago with dysuria, urinary  frequency, urgency, and hematuria. Patient then suddenly developed right flank pain tonight, sharp, continuous, nonradiating, accompanied with nausea, fever, chills, and generalized malaise/fatigue. Patient denies any specific trigger for her symptoms, denies any recent hospitalization, denies any recent surgery, and denies receiving any antibiotics prior to hospitalization. 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 Patient is currently visiting for vacation from [place], [place].  Patient also has a significant history of anal carcinoma and lymphoma, and claims that she is currently in remission. Patient denies any headache, lightheadedness, chest pain, shortness of breath, coughing, vomiting, abdominal pain, diarrhea, blood in stool, melena, numbness or  focal weakness.    Review of Systems:   Chief Complaint:  Flank Pain and Nausea    Review of Systems   Constitutional: Positive for chills, fever and malaise/fatigue. Negative for diaphoresis and weight loss.   HENT: Negative for congestion, ear pain, hearing loss, nosebleeds, sore throat and tinnitus.    Eyes: Negative for blurred vision, double vision, photophobia and pain.   Respiratory: Negative for cough, hemoptysis, sputum production, shortness of breath, wheezing and stridor.    Cardiovascular: Negative for chest pain, palpitations, orthopnea, claudication, leg swelling and PND.   Gastrointestinal: Positive for nausea. Negative for abdominal pain, blood in stool, diarrhea, heartburn, melena and vomiting.   Genitourinary: Positive for dysuria, flank pain, frequency, hematuria and urgency.   Musculoskeletal: Negative for back pain, falls, joint pain, myalgias and neck pain.   Skin: Negative for itching and rash.   Neurological: Negative for dizziness, tingling, tremors, sensory change, speech change, focal weakness, seizures, loss of consciousness and headaches.   Endo/Heme/Allergies: Negative for polydipsia. Does not bruise/bleed easily.   Psychiatric/Behavioral: Negative for depression, hallucinations, substance abuse and suicidal ideas.     Physical Exam:     Vitals:    01/07/21 2320 01/08/21 0000 01/08/21 0100 01/08/21 0213   BP: 132/58 134/70 110/54 117/51   Pulse: 84 90 (!) 101 (!) 108   Resp:       Temp:       TempSrc:       SpO2: 100% 99% 97% 95%   Weight:       Height:         Physical Exam:   Physical Exam  Constitutional:       General: She is not in acute distress.     Appearance: She is well-developed. She is not diaphoretic.   HENT:      Head: Normocephalic and atraumatic.      Nose: Nose normal.   Eyes:      General: No scleral icterus.     Conjunctiva/sclera: Conjunctivae normal.      Pupils: Pupils are equal, round, and reactive to light.   Neck:      Thyroid: No thyromegaly.      Vascular: No  JVD.      Trachea: No tracheal deviation.   Cardiovascular:      Rate and Rhythm: Regular rhythm. Tachycardia present.      Heart sounds: Normal heart sounds. No murmur heard.    No friction rub. No gallop.   Pulmonary:      Effort: Pulmonary effort is normal. No respiratory distress.      Breath sounds: Normal breath sounds. No stridor. No wheezing or rales.   Chest:  Chest wall: No tenderness.   Abdominal:      General: Bowel sounds are normal. There is no distension.      Palpations: Abdomen is soft. There is no mass.      Tenderness: There is abdominal tenderness. There is no guarding or rebound.      Comments: Right CVA tenderness   Musculoskeletal:         General: No swelling or tenderness.   Lymphadenopathy:      Cervical: No cervical adenopathy.   Skin:     General: Skin is warm.      Coloration: Skin is not pale.      Findings: No erythema or rash.   Neurological:      General: No focal deficit present.      Mental Status: She is alert and oriented to person, place, and time.      Cranial Nerves: No cranial nerve deficit.      Sensory: No sensory deficit.      Motor: No weakness or abnormal muscle tone.   Psychiatric:         Behavior: Behavior normal.         Thought Content: Thought content normal.         Judgment: Judgment normal.       Results of Labs/imaging:   Labs have been reviewed:   Coagulation Profile:       CBC review:   Recent Labs   Lab 01/07/21  2247   WBC 6.42   Hgb 11.0*   Hematocrit 35.1   Platelets 261   MCV 87.8   RDW 15   Neutrophils 77.3   Neutrophils Absolute 4.96   Lymphocytes Automated 10.4   Eosinophils Automated 3.1   Immature Granulocytes 0.3   Immature Granulocytes Absolute 0.02     Chem Review:  Recent Labs   Lab 01/07/21  2247   Sodium 135*   Potassium 4.7   Chloride 101   CO2 25   BUN 19.6*   Creatinine 0.9   Glucose 107*   Calcium 9.4   Bilirubin, Total 0.4   AST (SGOT) 24   ALT 16   Alkaline Phosphatase 75     Results     Procedure Component Value Units Date/Time     Lactic Acid [[id number]] Collected: 01/08/21 0044    Specimen: Blood Updated: 01/08/21 0116     Lactic Acid 1.1 mmol/L     Narrative:      Cancel second order for specimen if the initial level is less  than 2.16mEq/L    Urine culture [[id number]] Collected: 01/07/21 2247    Specimen: Urine, Clean Catch Updated: 01/08/21 0052    Narrative:      Indications for Urine Culture:->Other (please specify in  Comments)    Culture Blood Aerobic and Anaerobic [[id number]] Collected: 01/08/21 0044    Specimen: Arm from Blood, Venipuncture Updated: 01/08/21 0045    Narrative:      1 BLUE+1 PURPLE    Culture Blood Aerobic and Anaerobic [[id number]] Collected: 01/08/21 0044    Specimen: Arm from Blood, Venipuncture Updated: 01/08/21 0045    Narrative:      1 BLUE+1 PURPLE    Comprehensive metabolic panel [[id number]]  (Abnormal) Collected: 01/07/21 2247    Specimen: Blood Updated: 01/07/21 2332     Glucose 107 mg/dL      BUN 74.2 mg/dL      Creatinine 0.9 mg/dL      Sodium 595 mEq/L  Potassium 4.7 mEq/L      Chloride 101 mEq/L      CO2 25 mEq/L      Calcium 9.4 mg/dL      Protein, Total 6.8 g/dL      Albumin 3.6 g/dL      AST (SGOT) 24 U/L      ALT 16 U/L      Alkaline Phosphatase 75 U/L      Bilirubin, Total 0.4 mg/dL      Globulin 3.2 g/dL      Albumin/Globulin Ratio 1.1     Anion Gap 9.0    Lipase [[id number]] Collected: 01/07/21 2247    Specimen: Blood Updated: 01/07/21 2332     Lipase 17 U/L     GFR [[id number]] Collected: 01/07/21 2247     Updated: 01/07/21 2332     EGFR >60.0    Urinalysis Reflex to Microscopic Exam [[id number]]  (Abnormal) Collected: 01/07/21 2247    Specimen: Urine Updated: 01/07/21 2323     Urine Type Clean Catch     Color, UA Yellow     Clarity, UA Turbid     Specific Gravity UA 1.008     Urine pH 6.0     Leukocyte Esterase, UA Large     Nitrite, UA Positive     Protein, UR 30     Glucose, UA Negative     Ketones UA Negative     Urobilinogen, UA Normal mg/dL      Bilirubin, UA Negative     Blood, UA Large      RBC, UA TNTC /hpf      WBC, UA TNTC /hpf      Squamous Epithelial Cells, Urine 0-5 /hpf      Urine Mucus Present    CBC and differential [[id number]]  (Abnormal) Collected: 01/07/21 2247    Specimen: Blood Updated: 01/07/21 2311     WBC 6.42 x10 3/uL      Hgb 11.0 g/dL      Hematocrit 62.9 %      Platelets 261 x10 3/uL      RBC 4.00 x10 6/uL      MCV 87.8 fL      MCH 27.5 pg      MCHC 31.3 g/dL      RDW 15 %      MPV 10.3 fL      Neutrophils 77.3 %      Lymphocytes Automated 10.4 %      Monocytes 8.6 %      Eosinophils Automated 3.1 %      Basophils Automated 0.3 %      Immature Granulocytes 0.3 %      Nucleated RBC 0.0 /100 WBC      Neutrophils Absolute 4.96 x10 3/uL      Lymphocytes Absolute Automated 0.67 x10 3/uL      Monocytes Absolute Automated 0.55 x10 3/uL      Eosinophils Absolute Automated 0.20 x10 3/uL      Basophils Absolute Automated 0.02 x10 3/uL      Immature Granulocytes Absolute 0.02 x10 3/uL      Absolute NRBC 0.00 x10 3/uL         Radiology reports have been reviewed:  Radiology Results (24 Hour)     Procedure Component Value Units Date/Time    CT Abdomen Pelvis WO Contrast [[id number]] Collected: 01/08/21 0141    Order Status: Completed Updated: 01/08/21 0147    Narrative:      HISTORY: Right  flank pain    TECHNIQUE: Helical CT was performed of abdomen and pelvis without IV or  oral contrast.      CT ABDOMEN AND PELVIS WITHOUT CONTRAST:   There are bibasilar atelectasis. Stent is seen within right ureter.  There is mild hydronephrosis and perinephric stranding of right kidney.  Mild asymmetric wall thickening is seen along right lateral bladder  measuring up to 1 cm in thickness. There is no radioopaque stones seen  overlying the kidneys bilaterally.  There is no left-sided  hydronephrosis or hydroureter.  Limited evaluation of the solid  abdominal organs on this non-enhanced CT shows no gross mass.  There is  no evidence of intestinal obstruction.       Impression:           1. Right ureteral  stent seen in place with mild hydronephrosis and  perinephric stranding of right kidney  2. Asymmetric right lateral bladder wall thickening of unclear etiology.  Clinical correlation recommended.    Kristine Linea, MD   01/08/2021 1:44 AM        EKG: EKG reviewed   Last EKG Result     None          Hospitalist:   Signed by:   Kaleyah Labreck, MD  01/08/2021 3:00 AM    *This note was generated by the Epic EMR system/ Dragon speech recognition and may contain inherent errors or omissions not intended by the user. Grammatical errors, random word insertions, deletions, pronoun errors and incomplete sentences are occasional consequences of this technology due to software limitations. Not all errors are caught or corrected. If there are questions or concerns about the content of this note or information contained within the body of this dictation they should be addressed directly with the author for clarification.

## 2021-01-08 NOTE — Consults (Signed)
UROLOGY CONSULTATION    Date Time: 01/08/21 9:47 AM  Patient Name: Tina Vazquez  Requesting Physician: Lennox Laity, MD    Reason for Consultation:   Pyelonephritis [N12]  Pyuria [R82.81]  Pedal edema [R60.0]  Bladder wall thickening [N32.89]  Hydronephrosis, right [N13.30]  Acute right flank pain [R10.9]  Acute right-sided low back pain without sciatica [M54.50]  Hematuria, unspecified type [R31.9]  Assessment:   70 y/o female with retained right ureteral stent with right low back pain; CT shows stent in place,  WBC/lactic acid, kidney function wnl.  Plan:   Recommend antibiotics per Dr. Leonie Man and for pt to return to [facility] for planned intervention with stent.  History:   Tina Vazquez is a 70 y.o. female who presents to the hospital on 01/07/2021 with right low back pain.  I was consulted by Dr. Leonie Man for my opinion on retained ureteral stent.  Pt is a poor historian.  She has had this stent for 7 months.  Believes it was placed after stone surgery.  Has procedure planned for Wednesday, next week - she thinks the doctor was planning on removing it.  Pt denies fever.  Temp in hospital 100.6.  WBC and lactic acid and creatinine wnl.     Body mass index is 34.6 kg/m.    Past Medical History:     Past Medical History:   Diagnosis Date   . Hypertension    . Malignant neoplasm      Past Surgical History:   History reviewed. No pertinent surgical history.  Family History:   History reviewed. No pertinent family history.  Social History:     Social History     Socioeconomic History   . Marital status: Single     Spouse name: Not on file   . Number of children: Not on file   . Years of education: Not on file   . Highest education level: Not on file   Occupational History   . Not on file   Tobacco Use   . Smoking status: Never Smoker   . Smokeless tobacco: Never Used   Vaping Use   . Vaping Use: Never used   Substance and Sexual Activity   . Alcohol use: Yes     Alcohol/week: 1.0 standard drink     Types: 1 Glasses  of wine per week     Comment: 1x every 2 week   . Drug use: Yes     Frequency: 2.0 times per week     Types: Marijuana     Comment: marjauna   . Sexual activity: Not on file   Other Topics Concern   . Not on file   Social History Narrative   . Not on file     Social Determinants of Health     Financial Resource Strain: Not on file   Food Insecurity: Not on file   Transportation Needs: Not on file   Physical Activity: Not on file   Stress: Not on file   Social Connections: Not on file   Intimate Partner Violence: Not on file   Housing Stability: Not on file     Allergies:     Allergies   Allergen Reactions   . Citrus    . Dilaudid [Hydromorphone]    . Penicillins      Medications:     Prior to Admission medications    Medication Sig Start Date End Date Taking? Authorizing Provider   B Complex-C-E-Zn (MYBEC PO) Take by mouth  Yes [provider]   levothyroxine (SYNTHROID) 50 MCG tablet Take 50 mcg by mouth Once a day at 6:00am   Yes [provider]   Meloxicam 10 MG Cap Take 15 mg by mouth   Yes [provider]   Turmeric 500 MG Cap Take by mouth   Yes [provider]      Review of Systems:   A comprehensive review of systems was: General ROS: negative  Psychological ROS: negative for depression, anxiety  Ophthalmic ROS: negative vision changes, eye discharge  ENT ROS: negative for headaches, nasal congestion or visual changes  Allergy and Immunology ROS: negative for nasal congestion or seasonal allergies  Hematological and Lymphatic ROS: negative for bleeding problems, blood clots, night sweats or weight loss  Endocrine ROS: negative for malaise/lethargy, palpitations or skin changes  Breast ROS: negative for new or changing breast lumps  Respiratory ROS: negative for cough, orthopnea, shortness of breath or tachypnea  Cardiovascular ROS: negative for chest pain, dyspnea on exertion or edema  Gastrointestinal ROS: negative for abdominal pain, blood in stools, change in bowel  habits or constipation  Genito-Urinary ROS: negative for change in urinary stream, genital discharge or nocturia  Musculoskeletal ROS: negative for gait disturbance, muscle pain or muscular weakness  Neurological ROS: negative for behavioral changes, gait disturbance, headaches, impaired coordination/balance or numbness/tingling  Dermatological ROS: negative for dry skin, hair changes, lumps and rash  Physical Exam:   Temp:  [97.8 F (36.6 C)-100.6 F (38.1 C)] 100.3 F (37.9 C)  Heart Rate:  [84-117] 112  Resp Rate:  [21-24] 21  BP: (110-134)/(51-70) 110/55  Intake and Output Summary (Last 24 hours) at Date Time  No intake or output data in the 24 hours ending 01/08/21 0947  Body mass index is 34.6 kg/m.  General appearance - alert, well appearing, and in no distress  Mental status - alert, oriented to person, place, and time  Neck - supple, no significant adenopathy  Lymphatics - no palpable lymphadenopathy  Abdomen - soft, nontender, nondistended, no masses or organomegaly  Back exam - full range of motion,palpable spasm or pain on motion  Neurological - alert, oriented, normal speech, no focal findings or movement disorder noted  Musculoskeletal - no joint tenderness, deformity or swelling  Extremities - peripheral pulses normal, no pedal edema, no clubbing or cyanosis  Skin - normal coloration and turgor, no rashes, no suspicious skin lesions noted    Labs Reviewed:     Results     Procedure Component Value Units Date/Time    Lactic Acid [[id number]] Collected: 01/08/21 0753    Specimen: Blood Updated: 01/08/21 0815     Lactic Acid 1.0 mmol/L     Urine culture [[id number]] Collected: 01/07/21 2247    Specimen: Urine, Clean Catch Updated: 01/08/21 0717    Narrative:      Indications for Urine Culture:->Other (please specify in  Comments)    Culture Blood Aerobic and Anaerobic [[id number]] Collected: 01/08/21 0044    Specimen: Arm from Blood, Venipuncture Updated: 01/08/21 0554    Narrative:      1 BLUE+1 PURPLE     B-type Natriuretic Peptide [[id number]] Collected: 01/08/21 0458    Specimen: Blood Updated: 01/08/21 0538     B-Natriuretic Peptide 42.6 pg/mL     Comprehensive metabolic panel [[id number]]  (Abnormal) Collected: 01/08/21 0458    Specimen: Blood Updated: 01/08/21 0532     Glucose 114 mg/dL      BUN 24.4 mg/dL  Creatinine 0.8 mg/dL      Sodium 034 mEq/L      Potassium 4.1 mEq/L      Chloride 107 mEq/L      CO2 23 mEq/L      Calcium 8.5 mg/dL      Protein, Total 5.5 g/dL      Albumin 2.8 g/dL      AST (SGOT) 21 U/L      ALT 12 U/L      Alkaline Phosphatase 64 U/L      Bilirubin, Total 0.2 mg/dL      Globulin 2.7 g/dL      Albumin/Globulin Ratio 1.0     Anion Gap 6.0    GFR [[id number]] Collected: 01/08/21 0458     Updated: 01/08/21 0532     EGFR >60.0    Magnesium [[id number]] Collected: 01/08/21 0458    Specimen: Blood Updated: 01/08/21 0532     Magnesium 1.7 mg/dL     Lactic Acid [[id number]] Collected: 01/08/21 0458    Specimen: Blood Updated: 01/08/21 0523     Lactic Acid 1.6 mmol/L     PT/APTT [[id number]]  (Abnormal) Collected: 01/08/21 0458     Updated: 01/08/21 0519     PT 14.7 sec      PT INR 1.2     PTT 33 sec     CBC and differential [[id number]]  (Abnormal) Collected: 01/08/21 0458    Specimen: Blood Updated: 01/08/21 0509     WBC 7.46 x10 3/uL      Hgb 9.6 g/dL      Hematocrit 25.4 %      Platelets 194 x10 3/uL      RBC 3.45 x10 6/uL      MCV 86.7 fL      MCH 27.8 pg      MCHC 32.1 g/dL      RDW 15 %      MPV 10.2 fL      Neutrophils 90.2 %      Lymphocytes Automated 2.4 %      Monocytes 6.4 %      Eosinophils Automated 0.5 %      Basophils Automated 0.0 %      Immature Granulocytes 0.5 %      Nucleated RBC 0.0 /100 WBC      Neutrophils Absolute 6.72 x10 3/uL      Lymphocytes Absolute Automated 0.18 x10 3/uL      Monocytes Absolute Automated 0.48 x10 3/uL      Eosinophils Absolute Automated 0.04 x10 3/uL      Basophils Absolute Automated 0.00 x10 3/uL      Immature Granulocytes Absolute 0.04 x10 3/uL       Absolute NRBC 0.00 x10 3/uL     Lactic Acid [[id number]] Collected: 01/08/21 0044    Specimen: Blood Updated: 01/08/21 0116     Lactic Acid 1.1 mmol/L     Narrative:      Cancel second order for specimen if the initial level is less  than 2.36mEq/L    Comprehensive metabolic panel [[id number]]  (Abnormal) Collected: 01/07/21 2247    Specimen: Blood Updated: 01/07/21 2332     Glucose 107 mg/dL      BUN 07.3 mg/dL      Creatinine 0.9 mg/dL      Sodium 710 mEq/L      Potassium 4.7 mEq/L      Chloride 101 mEq/L      CO2 25 mEq/L      Calcium  9.4 mg/dL      Protein, Total 6.8 g/dL      Albumin 3.6 g/dL      AST (SGOT) 24 U/L      ALT 16 U/L      Alkaline Phosphatase 75 U/L      Bilirubin, Total 0.4 mg/dL      Globulin 3.2 g/dL      Albumin/Globulin Ratio 1.1     Anion Gap 9.0    Lipase [[id number]] Collected: 01/07/21 2247    Specimen: Blood Updated: 01/07/21 2332     Lipase 17 U/L     GFR [[id number]] Collected: 01/07/21 2247     Updated: 01/07/21 2332     EGFR >60.0    Urinalysis Reflex to Microscopic Exam [[id number]]  (Abnormal) Collected: 01/07/21 2247    Specimen: Urine Updated: 01/07/21 2323     Urine Type Clean Catch     Color, UA Yellow     Clarity, UA Turbid     Specific Gravity UA 1.008     Urine pH 6.0     Leukocyte Esterase, UA Large     Nitrite, UA Positive     Protein, UR 30     Glucose, UA Negative     Ketones UA Negative     Urobilinogen, UA Normal mg/dL      Bilirubin, UA Negative     Blood, UA Large     RBC, UA TNTC /hpf      WBC, UA TNTC /hpf      Squamous Epithelial Cells, Urine 0-5 /hpf      Urine Mucus Present    CBC and differential [[id number]]  (Abnormal) Collected: 01/07/21 2247    Specimen: Blood Updated: 01/07/21 2311     WBC 6.42 x10 3/uL      Hgb 11.0 g/dL      Hematocrit 62.9 %      Platelets 261 x10 3/uL      RBC 4.00 x10 6/uL      MCV 87.8 fL      MCH 27.5 pg      MCHC 31.3 g/dL      RDW 15 %      MPV 10.3 fL      Neutrophils 77.3 %      Lymphocytes Automated 10.4 %      Monocytes 8.6 %       Eosinophils Automated 3.1 %      Basophils Automated 0.3 %      Immature Granulocytes 0.3 %      Nucleated RBC 0.0 /100 WBC      Neutrophils Absolute 4.96 x10 3/uL      Lymphocytes Absolute Automated 0.67 x10 3/uL      Monocytes Absolute Automated 0.55 x10 3/uL      Eosinophils Absolute Automated 0.20 x10 3/uL      Basophils Absolute Automated 0.02 x10 3/uL      Immature Granulocytes Absolute 0.02 x10 3/uL      Absolute NRBC 0.00 x10 3/uL           Rads:     Radiology Results (24 Hour)     Procedure Component Value Units Date/Time    XR Chest AP Portable [[id number]] Collected: 01/08/21 0454    Order Status: Completed Updated: 01/08/21 0458    Narrative:      HISTORY: Shortness of breath.    COMPARISON: Chest radiograph dated one day prior.    TECHNIQUE:  AP chest    FINDINGS:  Mild diffuse interstitial prominence. Heart is not enlarged. No focal  airspace opacity or pleural effusion. No evidence of pneumothorax.  Degenerative changes of the bilateral shoulder joints.      Impression:        1. Mild diffuse interstitial prominence may suggest pulmonary edema.    Judd Gaudier, MD   01/08/2021 4:56 AM    CT Abdomen Pelvis WO Contrast [[id number]] Collected: 01/08/21 0141    Order Status: Completed Updated: 01/08/21 0147    Narrative:      HISTORY: Right flank pain    TECHNIQUE: Helical CT was performed of abdomen and pelvis without IV or  oral contrast.      CT ABDOMEN AND PELVIS WITHOUT CONTRAST:   There are bibasilar atelectasis. Stent is seen within right ureter.  There is mild hydronephrosis and perinephric stranding of right kidney.  Mild asymmetric wall thickening is seen along right lateral bladder  measuring up to 1 cm in thickness. There is no radioopaque stones seen  overlying the kidneys bilaterally.  There is no left-sided  hydronephrosis or hydroureter.  Limited evaluation of the solid  abdominal organs on this non-enhanced CT shows no gross mass.  There is  no evidence of intestinal obstruction.        Impression:           1. Right ureteral stent seen in place with mild hydronephrosis and  perinephric stranding of right kidney  2. Asymmetric right lateral bladder wall thickening of unclear etiology.  Clinical correlation recommended.    Kristine Linea, MD   01/08/2021 1:44 AM           Signed by: Billie Lade, NP, MSN,FNP-C, CUNP  [facility]  [phone] [facility] office

## 2021-01-08 NOTE — ED Notes (Signed)
Pt resting awaiting admit room.

## 2021-01-08 NOTE — UM Notes (Signed)
Hello    REF#  Admission clinicals for 01/07/21-01/08/21  C/b to Wynona Canes [phone]    Thank-you!      DIAGNOSIS    ICD-10-CM    1. Pyelonephritis  N12    2. Acute right flank pain  R10.9    3. Acute right-sided low back pain without sciatica  M54.50    4. Pedal edema  R60.0    5. Hematuria, unspecified type  R31.9    6. Pyuria  R82.81    7. Hydronephrosis, right  N13.30    8. Bladder wall thickening  N32.89          ADMIT DATE: 01/07/2021 at 2228       Reason For Hospitalization:    Pt is a 70 y.o. female who arrived to the ER with C/OFlank Pain and Nausea    Pt BIBA with 8/10 right lower back pain and nausea starting 3 hours ago. Pt has stents in between kidney and bladder, history of bladder cysts and kidney stones. Pt has scheduled procdedure to giet stents removed or replaced. Hx of cancer that is causing her bilateral leg swelling.    PMH:  has a past medical history of Hypertension and Malignant neoplasm.    Vitals:  97.8, HR 94, RR 22-23, BP 132/58, sats 99%    EKG: SUSPECT ARM LEAD REVERSAL, INTERPRETATION ASSUMES NO REVERSAL   SINUS TACHYCARDIA   LATERAL MYOCARDIAL INFARCTION   , AGE UNDETERMINED   INFERIOR MYOCARDIAL INFARCTION   , AGE UNDETERMINED     ED Abnormal Labs:  Hgb 11, Glucose 107, BUN 19.6, NA 135    Urine: turbid, large leukocytes, + nitrates, + protein, large blood    Radiologic Exams:   CT Abdomen Pelvis WO Contrast    Result Date: 01/08/2021   1. Right ureteral stent seen in place with mild hydronephrosis and perinephric stranding of right kidney 2. Asymmetric right lateral bladder wall thickening of unclear etiology. Clinical correlation recommended. Kristine Linea, MD  01/08/2021 1:44 AM    XR Chest AP Portable    Result Date: 01/08/2021  1. Mild diffuse interstitial prominence may suggest pulmonary edema. Judd Gaudier, MD  01/08/2021 4:56 AM         ED meds: IVF bolus , IV Fentanyl, 4mg  IV Zofran, 1g IV Rocephin, IV Fentanyl      Admit to Inpatient on 01/08/21 0421 :  Acute pyelonephritis with SIRS, obstructive uropathy and a right ureter stent in place    MD H&P :    Assessment/Plan:   1.  Acute pyelonephritis with SIRS, obstructive uropathy and a right ureter stent in place: Continue IV antibiotics with meropenem, blood cultures and urine cultures pending, continue IV fluid hydration, monitor on telemetry, keep n.p.o. for now, repeat labs in a.m., consult urology in a.m.  2.  Hypertension: Blood pressure stable off medications  3.  Hypothyroid: Continue Synthroid  4.  Normocytic anemia: Hemoglobin stable, continue to monitor  5.  History of anal cancer and lymphoma: Currently in remission  DVT Prohylaxis:SEDs       Treatment Plan:     Tele, blood cx xs 2, urine cx, NPO, external urinary catheter, prn Tylenol, prn IV Morphine, prn IV Zofran, prn OxyCodone, supp O2, VS q4, strain all urine, URO consult    Scheduled Meds:  Current Facility-Administered Medications   Medication Dose Route Frequency   . [START ON 01/09/2021] cefTRIAXone  1 g Intravenous Q24H   . lactobacillus/streptococcus  1 capsule Oral  Daily   . levothyroxine  50 mcg Oral Daily at 0600   . meropenem  1,000 mg Intravenous Q8H     Continuous Infusions:  . sodium chloride 125 mL/hr at 01/08/21 0616               =======Day 2 01/08/21 on TELE ========= increased MEWS score to 5 w/ fever, tachycardia and tachypnea    Vitals:   Temp:  [97.8 F (36.6 C)-100.6 F (38.1 C)]   Heart Rate:  [84-117]   Resp Rate:  [21-24]   BP: (110-134)/(51-70)   SpO2:  [95 %-100 %]   Height:  [160 cm (5\' 3" )]   Weight:  [87.2 kg (192 lb 3.9 oz)-88.6 kg (195 lb 5.2 oz)]   BMI (calculated):  [34.1]     ST on monitor    T 100.6-100.5   HR 101-117-112  RR to 24    On RA    C/o severe R flank pain 7-9 : 650mg  po Tylenol xs 2, 2mg  IV Morphine xs 1    Abnormal Labs:    Glucose 114, ALB 2.8, Mg 1.7, PTINR 14.7/1.2, H&H 9.6/29.9, RBC 3.45    IV PRN MEDS GIVEN: 2mg  IV Morphine xs 1      Testing/Procedures:      Blood cx xs 2 in process  Urine cx in  process    Continue Plan of Care:      URO consult 01/08/21 :  Assessment:   70 y/o female with retained right ureteral stent with right low back pain; CT shows stent in place,  WBC/lactic acid, kidney function wnl.  Plan:   Recommend antibiotics per Dr. Leonie Man and for pt to return to [facility] for planned intervention with stent.        Scheduled Meds:  Current Facility-Administered Medications   Medication Dose Route Frequency   . [START ON 01/09/2021] cefTRIAXone  1 g Intravenous Q24H   . lactobacillus/streptococcus  1 capsule Oral Daily   . levothyroxine  50 mcg Oral Daily at 0600   . meropenem  1,000 mg Intravenous Q8H     Continuous Infusions:  . sodium chloride 125 mL/hr at 01/08/21 1610       This clinical review is based on/compiled from documentation provided by the treatment team within the patient's medical record.

## 2021-01-08 NOTE — Progress Notes (Signed)
01/08/21 0810   Vital Signs   Level of Consciousness Alert   Temp 100.3 F (37.9 C)   Temp Source Temporal   Heart Rate (!) 112   Pulse (SpO2) (!) 112   Resp Rate 21   BP 110/55   BP Location Right arm   BP Method Automatic   MAP (mmHg) 72   Patient Position Lying   Oxygen Therapy   SpO2 95 %   O2 Device None (Room air)       Informed MD of Pts MEWS of at 4- trending down as previous MEWS was a 5. Pt receiving IV fluids and antibiotics. Has also received Tylenol and Morphine.  Will continue to monitor pt and MEWS score.

## 2021-01-08 NOTE — Progress Notes (Signed)
FOUR EYES SKIN ASSESSMENT NOTE    Tina Vazquez  Jan 17, 1951  [id number]         POC Initiated for Risk for Altered Skin: Yes    Mepilex Dressing Applied to sacrum/heel if any PI risk factors present: Yes    If Wound / Pressure Injury Present:    Wound / PI Documented on Patient Avatar Yes    Wound / PI assessment documented in Flowsheet: Yes    Admitting Physician notified: No      Wound Consult ordered: No      Lisl Slingerland Ernesta Amble, RN  January 08, 2021  7:01 AM    Second RN Name: Malachi Bonds, RN

## 2021-01-09 DIAGNOSIS — R7881 Bacteremia: Secondary | ICD-10-CM

## 2021-01-09 DIAGNOSIS — A419 Sepsis, unspecified organism: Principal | ICD-10-CM

## 2021-01-09 DIAGNOSIS — Z960 Presence of urogenital implants: Secondary | ICD-10-CM | POA: Diagnosis not present

## 2021-01-09 DIAGNOSIS — N10 Acute tubulo-interstitial nephritis: Secondary | ICD-10-CM | POA: Diagnosis not present

## 2021-01-09 DIAGNOSIS — B961 Klebsiella pneumoniae [K. pneumoniae] as the cause of diseases classified elsewhere: Secondary | ICD-10-CM | POA: Diagnosis not present

## 2021-01-09 DIAGNOSIS — I10 Essential (primary) hypertension: Secondary | ICD-10-CM | POA: Diagnosis not present

## 2021-01-09 DIAGNOSIS — N132 Hydronephrosis with renal and ureteral calculous obstruction: Secondary | ICD-10-CM | POA: Diagnosis not present

## 2021-01-09 MED ORDER — BUTALBITAL-APAP-CAFFEINE 50-325-40 MG PO TABS
1.0000 | ORAL_TABLET | Freq: Three times a day (TID) | ORAL | Status: DC | PRN
Start: 2021-01-09 — End: 2021-01-11
  Administered 2021-01-09 – 2021-01-11 (×3): 1 via ORAL
  Filled 2021-01-09 (×3): qty 1

## 2021-01-09 NOTE — Consults (Signed)
INFECTIOUS DISEASE CONSULTATION NOTE    Date Time: 01/09/21 9:13 AM  Patient Name: Tina Vazquez  Requesting Physician: Lennox Laity, MD     Reason for Consultation:   Pyelonephritis, GNR bacteremia    History:   70F w/ PMH HTN, lymphoma, anal cancer s/p radiation    Presented with R flank pain, dysuria, frequency, urgency and hematuria. She has significant urological history. Has obstructive uropathy with right ureteral stent in place.  Had infection last year for which she was treated. She has the stent replaced often. Is due for replacement on Wed by her primary urologist. Was having fevers and chills last few days before presenting. [place] to [place] at [employer]    Past Medical History:     Past Medical History:   Diagnosis Date   . Hypertension    . Malignant neoplasm        Past Surgical History:   History reviewed. No pertinent surgical history.    Family History:   History reviewed. No pertinent family history.    Social History:     Social History     Socioeconomic History   . Marital status: Single     Spouse name: Not on file   . Number of children: Not on file   . Years of education: Not on file   . Highest education level: Not on file   Occupational History   . Not on file   Tobacco Use   . Smoking status: Never Smoker   . Smokeless tobacco: Never Used   Vaping Use   . Vaping Use: Never used   Substance and Sexual Activity   . Alcohol use: Yes     Alcohol/week: 1.0 standard drink     Types: 1 Glasses of wine per week     Comment: 1x every 2 week   . Drug use: Yes     Frequency: 2.0 times per week     Types: Marijuana     Comment: marjauna   . Sexual activity: Not on file   Other Topics Concern   . Not on file   Social History Narrative   . Not on file     Social Determinants of Health     Financial Resource Strain: Not on file   Food Insecurity: Not on file   Transportation Needs: Not on file   Physical Activity: Not on file   Stress: Not on file   Social Connections: Not on file    Intimate Partner Violence: Not on file   Housing Stability: Not on file       Allergies:     Allergies   Allergen Reactions   . Citrus    . Dilaudid [Hydromorphone]    . Penicillins      Tolerated ctx       Medications:     Current Facility-Administered Medications   Medication Dose Route Frequency   . cefTRIAXone  1 g Intravenous Q24H   . lactobacillus/streptococcus  1 capsule Oral Daily   . levothyroxine  50 mcg Oral Daily at 0600       Review of Systems:   Positives in BOLD:    General: Fever, chills, fatigue, weight gain, weight loss, malaise  HEENT: headache, ear ache, blurry vision, vision loss, eye pain, nasal discharge, sneezing, sore throat, lymphadenopathy  CVS: tachycardia, chest pain, shortness of breath, DOE  Lungs: Wheezing, cough, hemoptysis  Abd: Abd pain, nausea, vomiting, diarrhea, constipation, melena, hematochezia, hematemesis  Extremities: Swelling, pain, weakness  Skin: rash, ecchymosis, pruritis  Neurological: confusion, altered mental status, photophobia, weakness, numbness    Physical Exam:     Vitals:    01/09/21 0844   BP: 118/58   Pulse: (!) 101   Resp: 20   Temp: 99.3 F (37.4 C)   SpO2: 94%       General NAD, appears stated age   HEENT Autramatic, PERRL, EOMI, No thrush, Neck supple, no LAD   Heart RRR, S1S2+, No murmurs   Lung Clear to auscultation bilaterally, no wheezes, no rales   Abdomen BS normal. Soft, nondistended, nontender.    Extremities No edema   Skin No rash, no jaundice   Neuro AAOx3, Normal affect, mood. CN grossly normal. No focal neurological deficit.      Lines: PIV  Foley: In Place    Labs Reviewed:       Recent Labs   Lab 01/08/21  0458 01/07/21  2247   WBC 7.46 6.42   RBC 3.45* 4.00   Hgb 9.6* 11.0*   Hematocrit 29.9* 35.1   MCV 86.7 87.8   MCHC 32.1 31.3*   RDW 15 15   MPV 10.2 10.3   Platelets 194 261       Recent Labs   Lab 01/08/21  0458 01/07/21  2247   Sodium 136 135*   Potassium 4.1 4.7   Chloride 107 101   CO2 23 25   BUN 16.9 19.6*   Creatinine 0.8 0.9    Glucose 114* 107*   Calcium 8.5 9.4   Magnesium 1.7  --        Recent Labs   Lab 01/08/21  0458 01/07/21  2247   ALT 12 16   AST (SGOT) 21 24   Bilirubin, Total 0.2 0.4   Albumin 2.8* 3.6   Alkaline Phosphatase 64 75       Microbiology   Hx kleb and e coli uti  4/21 UCx pending  4/22 BCx GNR  Antimicrobials   Ceftriaxone 4/22->    Prev: meropenem    Radiology   CT Abdomen Pelvis WO Contrast    Result Date: 01/08/2021   1. Right ureteral stent seen in place with mild hydronephrosis and perinephric stranding of right kidney 2. Asymmetric right lateral bladder wall thickening of unclear etiology. Clinical correlation recommended. Kristine Linea, MD  01/08/2021 1:44 AM    XR Chest AP Portable    Result Date: 01/08/2021  1. Mild diffuse interstitial prominence may suggest pulmonary edema. Judd Gaudier, MD  01/08/2021 4:56 AM         Assessment/Plan   57F w/ hx obstructive uropathy with R ureteral stent.    Fever. Dysuria.  Pyuria. No leukocytosis.  CXR pulm edema.  CTAP Rt hydro and perinephric stranding.  Historical cultures with pansensitive Klebsiella.    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    Plan:  Await sensitivities.  IV fluids. Analgesia.  Concern for infected ureteral stent.  Recommend stent removal.  Cont ceftriaxone @ 1g daily. No MDR organisms on care everywhere cultures.  If she improves with medical therapy alone, ok to send back home to remove stent with her primary urologist.   Following, thank you for involving [place] in the care of this patient.     Melodie Bouillon, MD   Pager [phone]  [facility]  Office: [phone]  Fax: [phone]

## 2021-01-09 NOTE — Plan of Care (Signed)
Problem: Safety  Goal: Patient will be free from injury during hospitalization  Flowsheets (Taken 01/08/2021 2130)  Patient will be free from injury during hospitalization:   Assess patient's risk for falls and implement fall prevention plan of care per policy   Provide and maintain safe environment   Use appropriate transfer methods   Ensure appropriate safety devices are available at the bedside   Include patient/ family/ care giver in decisions related to safety   Provide alternative method of communication if needed (communication boards, writing)   Assess for patients risk for elopement and implement Elopement Risk Plan per policy   Hourly rounding  Goal: Patient will be free from infection during hospitalization  Flowsheets (Taken 01/08/2021 2130)  Free from Infection during hospitalization:   Assess and monitor for signs and symptoms of infection   Monitor lab/diagnostic results   Monitor all insertion sites (i.e. indwelling lines, tubes, urinary catheters, and drains)   Encourage patient and family to use good hand hygiene technique     Problem: Pain  Goal: Pain at adequate level as identified by patient  Flowsheets (Taken 01/08/2021 2130)  Pain at adequate level as identified by patient:   Identify patient comfort function goal   Assess for risk of opioid induced respiratory depression, including snoring/sleep apnea. Alert healthcare team of risk factors identified.   Assess pain on admission, during daily assessment and/or before any "as needed" intervention(s)   Reassess pain within 30-60 minutes of any procedure/intervention, per Pain Assessment, Intervention, Reassessment (AIR) Cycle   Evaluate if patient comfort function goal is met   Evaluate patient's satisfaction with pain management progress   Offer non-pharmacological pain management interventions   Consult/collaborate with Pain Service   Consult/collaborate with Physical Therapy, Occupational Therapy, and/or Speech Therapy   Include  patient/patient care companion in decisions related to pain management as needed     Problem: Renal Instability  Goal: Fluid and electrolyte balance are achieved/maintained  Flowsheets (Taken 01/08/2021 2130)  Fluid and electrolyte balance are achieved/maintained:   Monitor intake and output every shift   Monitor/assess lab values and report abnormal values   Provide adequate hydration   Assess for confusion/personality changes   Assess and reassess fluid and electrolyte status   Observe for seizure activity and initiate seizure precautions if indicated   Observe for cardiac arrhythmias   Monitor for muscle weakness   Follow fluid restrictions/IV/PO parameters  Goal: Nutritional intake is adequate  Flowsheets (Taken 01/08/2021 2130)  Nutritional intake is adequate:   Assist patient with meals/food selection   Allow adequate time for meals   Encourage/perform oral hygiene as appropriate   Encourage/administer dietary supplements as ordered (i.e. tube feed, TPN, oral, OGT/NGT, supplements)   Consult/collaborate with Clinical Nutritionist   Include patient/patient care companion in decisions related to nutrition   Consult/collaborate with Speech Therapy (swallow evaluations)   Assess anorexia, appetite, and amount of meal/food tolerated  Goal: Perineal skin integrity is maintained or improved  Flowsheets (Taken 01/08/2021 2130)  Perineal skin integrity is maintained or improved:   Keep intact skin clean and dry   Apply urinary containment device as appropriate and/or per order   Use protective skin barriers to decrease potential skin breakdown   Consult/collaborate with Wound Care Nurse  Goal: Free from infection  Flowsheets (Taken 01/08/2021 2130)  Free from infection:   Monitor/assess for signs and symptoms of infection   Assess need for indwelling catheter every shift and discuss with LIP

## 2021-01-09 NOTE — UM Notes (Signed)
Hello    REF#  Admission clinicals for 01/07/21-01/08/21  C/b to Wynona Canes [phone]    Thank-you!      DIAGNOSIS    ICD-10-CM    1. Pyelonephritis  N12    2. Acute right flank pain  R10.9    3. Acute right-sided low back pain without sciatica  M54.50    4. Pedal edema  R60.0    5. Hematuria, unspecified type  R31.9    6. Pyuria  R82.81    7. Hydronephrosis, right  N13.30    8. Bladder wall thickening  N32.89          ADMIT DATE: 01/07/2021 at 2228       Reason For Hospitalization:    Pt is a 70 y.o. female who arrived to the ER with C/OFlank Pain and Nausea    Pt BIBA with 8/10 right lower back pain and nausea starting 3 hours ago. Pt has stents in between kidney and bladder, history of bladder cysts and kidney stones. Pt has scheduled procdedure to giet stents removed or replaced. Hx of cancer that is causing her bilateral leg swelling.    PMH:  has a past medical history of Hypertension and Malignant neoplasm.    Vitals:  97.8, HR 94, RR 22-23, BP 132/58, sats 99%    EKG: SUSPECT ARM LEAD REVERSAL, INTERPRETATION ASSUMES NO REVERSAL   SINUS TACHYCARDIA   LATERAL MYOCARDIAL INFARCTION   , AGE UNDETERMINED   INFERIOR MYOCARDIAL INFARCTION   , AGE UNDETERMINED     ED Abnormal Labs:  Hgb 11, Glucose 107, BUN 19.6, NA 135    Urine: turbid, large leukocytes, + nitrates, + protein, large blood    Radiologic Exams:   CT Abdomen Pelvis WO Contrast    Result Date: 01/08/2021   1. Right ureteral stent seen in place with mild hydronephrosis and perinephric stranding of right kidney 2. Asymmetric right lateral bladder wall thickening of unclear etiology. Clinical correlation recommended. Kristine Linea, MD  01/08/2021 1:44 AM    XR Chest AP Portable    Result Date: 01/08/2021  1. Mild diffuse interstitial prominence may suggest pulmonary edema. Judd Gaudier, MD  01/08/2021 4:56 AM         ED meds: IVF bolus , IV Fentanyl, 4mg  IV Zofran, 1g IV Rocephin, IV Fentanyl      Admit to Inpatient on 01/08/21 0421 :  Acute pyelonephritis with SIRS, obstructive uropathy and a right ureter stent in place    MD H&P :    Assessment/Plan:   1.  Acute pyelonephritis with SIRS, obstructive uropathy and a right ureter stent in place: Continue IV antibiotics with meropenem, blood cultures and urine cultures pending, continue IV fluid hydration, monitor on telemetry, keep n.p.o. for now, repeat labs in a.m., consult urology in a.m.  2.  Hypertension: Blood pressure stable off medications  3.  Hypothyroid: Continue Synthroid  4.  Normocytic anemia: Hemoglobin stable, continue to monitor  5.  History of anal cancer and lymphoma: Currently in remission  DVT Prohylaxis:SEDs       Treatment Plan:     Tele, blood cx xs 2, urine cx, NPO, external urinary catheter, prn Tylenol, prn IV Morphine, prn IV Zofran, prn OxyCodone, supp O2, VS q4, strain all urine, URO consult    Scheduled Meds:  Current Facility-Administered Medications   Medication Dose Route Frequency   . cefTRIAXone  1 g Intravenous Q24H   . lactobacillus/streptococcus  1 capsule Oral Daily   .  levothyroxine  50 mcg Oral Daily at 0600     Continuous Infusions:  . sodium chloride 125 mL/hr at 01/09/21 1101               =======Day 2 01/08/21 on TELE ========= increased MEWS score to 5 w/ fever, tachycardia and tachypnea    Vitals:   Temp:  [97.6 F (36.4 C)-99.9 F (37.7 C)]   Heart Rate:  [80-107]   Resp Rate:  [18-22]   BP: (97-118)/(57-67)   SpO2:  [92 %-99 %]     ST on monitor    T 100.6-100.5   HR 101-117-112  RR to 24    On RA    C/o severe R flank pain 7-9 : 650mg  po Tylenol xs 2, 2mg  IV Morphine xs 1    Abnormal Labs:    Glucose 114, ALB 2.8, Mg 1.7, PTINR 14.7/1.2, H&H 9.6/29.9, RBC 3.45    IV PRN MEDS GIVEN: 2mg  IV Morphine xs 1      Testing/Procedures:      Blood cx xs 2 in process  Urine cx in process    Continue Plan of Care:      URO consult 01/08/21 :  Assessment:   70 y/o female with retained right ureteral stent with right low back pain; CT shows stent in place,   WBC/lactic acid, kidney function wnl.  Plan:   Recommend antibiotics per Dr. Leonie Man and for pt to return to [facility] for planned intervention with stent.        Scheduled Meds:  Current Facility-Administered Medications   Medication Dose Route Frequency   . cefTRIAXone  1 g Intravenous Q24H   . lactobacillus/streptococcus  1 capsule Oral Daily   . levothyroxine  50 mcg Oral Daily at 0600     Continuous Infusions:  . sodium chloride 125 mL/hr at 01/09/21 1101     4/23-  Assessment and Plan:    Sepsis: Secondary to acute pyelonephritis, with bacteremia, her blood culture was positive after less than 24 hours, growing gram-negative rods, continue with IV antibiotics, consult with infectious disease.   Acute pyelonephritis: With a known history of obstructive uropathy, status post ureteral stent placement was initially done 14 months ago, last 1 was replaced 6 months ago and it was done in [place], discussed with urologist, no immediate need for urologist intervention, patient can follow-up with her urologist in [place] in 2 weeks after completing her antibiotic treatment for pyelonephritis.   Hypertension:Blood pressure stable off medications   Hypothyroid:Continue Synthroid   Normocytic anemia:Hemoglobin stable, continue to monitor   History of anal cancer and lymphoma:Currently in remission    DVT Prohylaxis:SEDs  Code Status:No Order  Disposition:home  Type of Admission:Inpatient  Estimated Length of Stay (including stay in the ER receiving treatment):More than 2 days    ID MD notes:  Assessment/Plan   23F w/ hx obstructive uropathy with R ureteral stent.    Fever. Dysuria.  Pyuria. No leukocytosis.  CXR pulm edema.  CTAP Rt hydro and perinephric stranding.  Historical cultures with pansensitive Klebsiella.    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    Plan:  Await  sensitivities.  IV fluids. Analgesia.  Concern for infected ureteral stent.  Recommend stent removal.  Cont ceftriaxone @ 1g daily. No MDR organisms on care everywhere cultures.  If she improves with medical therapy alone, ok to send back  home to remove stent with her primary urologist.     This clinical review is based on/compiled from documentation provided by the treatment team within the patient's medical record.

## 2021-01-09 NOTE — Progress Notes (Signed)
[facility]  Progress Note  Patient Info:   Date/Time: 01/09/2021 / 12:54 PM   Admit Date:01/07/2021  Patient Name:Tina Vazquez   VWU:[id number]   PCP: Patsy Lager, MD  Attending Physician:Christine Schiefelbein, Jerelene Redden, MD     Assessment and Plan:   Sepsis: Secondary to acute pyelonephritis, with bacteremia, her blood culture was positive after less than 24 hours, growing gram-negative rods, continue with IV antibiotics, consult with infectious disease.  Acute pyelonephritis: With a known history of obstructive uropathy, status post ureteral stent placement was initially done 14 months ago, last 1 was replaced 6 months ago and it was done in [place], discussed with urologist, no immediate need for urologist intervention, patient can follow-up with her urologist in [place] in 2 weeks after completing her antibiotic treatment for pyelonephritis.  Hypertension: Blood pressure stable off medications  Hypothyroid: Continue Synthroid  Normocytic anemia: Hemoglobin stable, continue to monitor  History of anal cancer and lymphoma: Currently in remission    DVT Prohylaxis:SEDs   Code Status: No Order  Disposition:home  Type of Admission: Inpatient  Estimated Length of Stay (including stay in the ER receiving treatment): More than 2 days  Hospital Problems:   Principal Problem:    Acute pyelonephritis  Active Problems:    SIRS (systemic inflammatory response syndrome)    Normocytic anemia    HTN (hypertension)    History of anal cancer    History of lymphoma    Obstructive uropathy    Ureteral stent retained    Subjective:   01/09/21 patient is doing well, complaining of some headache, denied any abdominal pain, no fever or chills.    Chief Complaint:  Flank Pain and Nausea    ROS   Feeling better, denied any chest pain, no shortness of breath  Mild headache  No fever or chills  Denied any nausea, vomiting or diarrhea    Objective:     Vitals:    01/08/21 2017 01/09/21 0047 01/09/21 0409 01/09/21 0844   BP:  97/61 101/57 112/67 118/58   Pulse: 91 99 80 (!) 101   Resp: 22 20 20 20    Temp: 97.8 F (36.6 C) 99.9 F (37.7 C) 97.6 F (36.4 C) 99.3 F (37.4 C)   TempSrc: Temporal Temporal Temporal Temporal   SpO2: 99% 96% 92% 94%   Weight:       Height:         Physical Exam:   Physical Exam   Constitutional:       General: She is not in acute distress.     Appearance: She is well-developed. She is not diaphoretic.   HENT:      Head: Normocephalic and atraumatic.      Nose: Nose normal.   Eyes:      General: No scleral icterus.     Conjunctiva/sclera: Conjunctivae normal.      Pupils: Pupils are equal, round, and reactive to light.   Neck:      Thyroid: No thyromegaly.      Vascular: No JVD.      Trachea: No tracheal deviation.   Cardiovascular:      Rate and Rhythm: Regular rhythm. Tachycardia present.      Heart sounds: Normal heart sounds. No murmur heard.    No friction rub. No gallop.   Pulmonary:      Effort: Pulmonary effort is normal. No respiratory distress.      Breath sounds: Normal breath sounds. No stridor. No wheezing or rales.  Chest:      Chest wall: No tenderness.   Abdominal:      General: Bowel sounds are normal. There is no distension.      Palpations: Abdomen is soft. There is no mass.      Tenderness: There is abdominal tenderness. There is no guarding or rebound.      Comments: Right CVA tenderness  improved  Musculoskeletal:         General: No swelling or tenderness.     Results of Labs/imaging   Labs and radiology reports have been reviewed.    Hospitalist   Signed by:   Lennox Laity, MD  01/09/2021 12:54 PM    *This note was generated by the Epic EMR system/ Dragon speech recognition and may contain inherent errors or omissions not intended by the user. Grammatical errors, random word insertions, deletions, pronoun errors and incomplete sentences are occasional consequences of this technology due to software limitations. Not all errors are caught or corrected. If there are questions or concerns  about the content of this note or information contained within the body of this dictation they should be addressed directly with the author for clarification

## 2021-01-09 NOTE — Plan of Care (Signed)
Problem: Safety  Goal: Patient will be free from injury during hospitalization  Outcome: Progressing  Flowsheets (Taken 01/09/2021 1646)  Patient will be free from injury during hospitalization:   Assess patient's risk for falls and implement fall prevention plan of care per policy   Provide and maintain safe environment   Use appropriate transfer methods   Ensure appropriate safety devices are available at the bedside     Problem: Infection  Goal: Free from infection  Flowsheets (Taken 01/09/2021 1649)  Free from infection:   Assess for signs/symptoms of infection   Utilize sepsis protocol

## 2021-01-09 NOTE — Plan of Care (Signed)
Problem: Safety  Goal: Patient will be free from injury during hospitalization  Flowsheets (Taken 01/09/2021 2030)  Patient will be free from injury during hospitalization:   Assess patient's risk for falls and implement fall prevention plan of care per policy   Provide and maintain safe environment   Use appropriate transfer methods   Ensure appropriate safety devices are available at the bedside   Include patient/ family/ care giver in decisions related to safety   Hourly rounding   Assess for patients risk for elopement and implement Elopement Risk Plan per policy   Provide alternative method of communication if needed (communication boards, writing)  Goal: Patient will be free from infection during hospitalization  Flowsheets (Taken 01/09/2021 2030)  Free from Infection during hospitalization:   Assess and monitor for signs and symptoms of infection   Monitor all insertion sites (i.e. indwelling lines, tubes, urinary catheters, and drains)   Monitor lab/diagnostic results   Encourage patient and family to use good hand hygiene technique     Problem: Pain  Goal: Pain at adequate level as identified by patient  Flowsheets (Taken 01/09/2021 2030)  Pain at adequate level as identified by patient:   Identify patient comfort function goal   Assess for risk of opioid induced respiratory depression, including snoring/sleep apnea. Alert healthcare team of risk factors identified.   Assess pain on admission, during daily assessment and/or before any "as needed" intervention(s)   Reassess pain within 30-60 minutes of any procedure/intervention, per Pain Assessment, Intervention, Reassessment (AIR) Cycle   Evaluate if patient comfort function goal is met   Consult/collaborate with Pain Service   Evaluate patient's satisfaction with pain management progress   Offer non-pharmacological pain management interventions   Consult/collaborate with Physical Therapy, Occupational Therapy, and/or Speech Therapy   Include  patient/patient care companion in decisions related to pain management as needed     Problem: Renal Instability  Goal: Fluid and electrolyte balance are achieved/maintained  Flowsheets (Taken 01/09/2021 2030)  Fluid and electrolyte balance are achieved/maintained:   Monitor intake and output every shift   Monitor/assess lab values and report abnormal values   Provide adequate hydration   Monitor daily weight   Assess for confusion/personality changes   Assess and reassess fluid and electrolyte status   Observe for seizure activity and initiate seizure precautions if indicated   Follow fluid restrictions/IV/PO parameters   Observe for cardiac arrhythmias   Monitor for muscle weakness  Goal: Nutritional intake is adequate  Flowsheets (Taken 01/09/2021 2030)  Nutritional intake is adequate:   Monitor daily weights   Assist patient with meals/food selection   Allow adequate time for meals   Encourage/perform oral hygiene as appropriate   Encourage/administer dietary supplements as ordered (i.e. tube feed, TPN, oral, OGT/NGT, supplements)   Consult/collaborate with Clinical Nutritionist   Include patient/patient care companion in decisions related to nutrition   Assess anorexia, appetite, and amount of meal/food tolerated   Consult/collaborate with Speech Therapy (swallow evaluations)  Goal: Perineal skin integrity is maintained or improved  Flowsheets (Taken 01/09/2021 2030)  Perineal skin integrity is maintained or improved:   Keep intact skin clean and dry   Apply urinary containment device as appropriate and/or per order   Use protective skin barriers to decrease potential skin breakdown   Consult/collaborate with Wound Care Nurse  Goal: Free from infection  Flowsheets (Taken 01/09/2021 2030)  Free from infection:   Monitor/assess for signs and symptoms of infection   Assess need for indwelling catheter every shift  and discuss with LIP     Problem: Infection  Goal: Free from infection  Flowsheets (Taken 01/09/2021  2030)  Free from infection:   Assess for signs/symptoms of infection   Utilize isolation precautions per protocol/policy   Assess immunization status   Consult/collaborate with Infection Preventionist   Utilize sepsis protocol

## 2021-01-10 DIAGNOSIS — N10 Acute tubulo-interstitial nephritis: Secondary | ICD-10-CM | POA: Diagnosis not present

## 2021-01-10 DIAGNOSIS — R7881 Bacteremia: Secondary | ICD-10-CM | POA: Diagnosis not present

## 2021-01-10 DIAGNOSIS — B961 Klebsiella pneumoniae [K. pneumoniae] as the cause of diseases classified elsewhere: Secondary | ICD-10-CM | POA: Diagnosis not present

## 2021-01-10 DIAGNOSIS — A419 Sepsis, unspecified organism: Secondary | ICD-10-CM | POA: Diagnosis not present

## 2021-01-10 DIAGNOSIS — I10 Essential (primary) hypertension: Secondary | ICD-10-CM | POA: Diagnosis not present

## 2021-01-10 DIAGNOSIS — Z960 Presence of urogenital implants: Secondary | ICD-10-CM | POA: Diagnosis not present

## 2021-01-10 MED ORDER — SODIUM CHLORIDE 0.9 % IV MBP
1000.0000 mg | INTRAVENOUS | Status: DC
Start: 2021-01-10 — End: 2021-01-11
  Administered 2021-01-10 – 2021-01-11 (×2): 1000 mg via INTRAVENOUS
  Filled 2021-01-10 (×2): qty 1000

## 2021-01-10 NOTE — Plan of Care (Signed)
Problem: Safety  Goal: Patient will be free from injury during hospitalization  01/10/2021 1213 by Newman Nip, RN  Outcome: Progressing  Flowsheets (Taken 01/10/2021 1213)  Patient will be free from injury during hospitalization:   Assess patient's risk for falls and implement fall prevention plan of care per policy   Provide and maintain safe environment   Use appropriate transfer methods  01/10/2021 1212 by Newman Nip, RN  Outcome: Progressing  Flowsheets (Taken 01/10/2021 1212)  Patient will be free from injury during hospitalization:   Assess patient's risk for falls and implement fall prevention plan of care per policy   Provide and maintain safe environment   Use appropriate transfer methods  Goal: Patient will be free from infection during hospitalization  01/10/2021 1213 by Newman Nip, RN  Outcome: Progressing  Flowsheets (Taken 01/10/2021 1213)  Free from Infection during hospitalization:   Assess and monitor for signs and symptoms of infection   Monitor all insertion sites (i.e. indwelling lines, tubes, urinary catheters, and drains)   Monitor lab/diagnostic results  01/10/2021 1212 by Newman Nip, RN  Outcome: Progressing  Flowsheets (Taken 01/10/2021 1212)  Free from Infection during hospitalization:   Assess and monitor for signs and symptoms of infection   Monitor lab/diagnostic results     Problem: Renal Instability  Goal: Fluid and electrolyte balance are achieved/maintained  01/10/2021 1213 by Newman Nip, RN  Outcome: Progressing  Flowsheets (Taken 01/10/2021 1213)  Fluid and electrolyte balance are achieved/maintained:   Monitor/assess lab values and report abnormal values   Provide adequate hydration  01/10/2021 1212 by Newman Nip, RN  Outcome: Progressing  Flowsheets (Taken 01/10/2021 1212)  Fluid and electrolyte balance are achieved/maintained:   Monitor intake and output every shift   Monitor/assess lab values and report abnormal values   Provide adequate  hydration   Monitor daily weight  Goal: Nutritional intake is adequate  01/10/2021 1213 by Newman Nip, RN  Outcome: Progressing  Flowsheets (Taken 01/10/2021 1213)  Nutritional intake is adequate:   Monitor daily weights   Assist patient with meals/food selection   Allow adequate time for meals  01/10/2021 1212 by Newman Nip, RN  Outcome: Progressing  Flowsheets (Taken 01/10/2021 1212)  Nutritional intake is adequate:   Monitor daily weights   Assist patient with meals/food selection   Allow adequate time for meals  Goal: Perineal skin integrity is maintained or improved  01/10/2021 1213 by Newman Nip, RN  Outcome: Progressing  Flowsheets (Taken 01/10/2021 1213)  Perineal skin integrity is maintained or improved: Keep intact skin clean and dry  01/10/2021 1212 by Newman Nip, RN  Outcome: Progressing  Flowsheets (Taken 01/10/2021 1212)  Perineal skin integrity is maintained or improved: Keep intact skin clean and dry  Goal: Free from infection  01/10/2021 1213 by Newman Nip, RN  Outcome: Progressing  Flowsheets (Taken 01/10/2021 1213)  Free from infection: Monitor/assess for signs and symptoms of infection  01/10/2021 1212 by Newman Nip, RN  Outcome: Progressing  Flowsheets (Taken 01/10/2021 1212)  Free from infection: Monitor/assess for signs and symptoms of infection     Problem: Infection  Goal: Free from infection  01/10/2021 1213 by Newman Nip, RN  Outcome: Progressing  01/10/2021 1213 by Newman Nip, RN  Outcome: Progressing  Flowsheets (Taken 01/10/2021 1213)  Free from infection:   Assess for signs/symptoms of infection   Utilize isolation precautions per protocol/policy  01/10/2021 1212 by Newman Nip, RN  Flowsheets (Taken 01/10/2021 1212)  Free from infection:   Assess for signs/symptoms  of infection   Utilize isolation precautions per protocol/policy

## 2021-01-10 NOTE — Progress Notes (Signed)
Infectious disease progress note:    Ceftriaxone #3  Peripheral IV    70 year old female with history of hypertension.  Past history of lymphoma and anal rectal cancer.    She has been having a problem with right ureteral stent obstruction.  This was felt possibly related to previous radiation.  She currently has a stent in her right ureter which is been present for approximately 7 months.    She is here visiting from [place].  She was planning on going to a concert.  She became ill and presented with urinary tract infection with fevers and chills.  Urine has Klebsiella pneumonia growing in culture.  Blood cultures also with Klebsiella pneumoniae.  The patient is currently on ceftriaxone 1 g IV daily.  She states that she is feeling much better.  She is still having intermittent fever but her temperature curve is trending downwards.  No headache  No shortness of breath or cough  No chest pain  No nausea or vomiting, no diarrhea.  She is having some back pain but says it is all musculoskeletal.  Overall she is feeling much better.    BP 132/62   Pulse 90   Temp 98.9 F (37.2 C) (Oral)   Resp 20   Ht 1.6 m (5\' 3" )   Wt 88.6 kg (195 lb 5.2 oz)   SpO2 97%   BMI 34.60 kg/m     She appears in no acute distress.  She is eating her lunch.  Head neck examination is unremarkable  Lungs clear  There is no CVA tenderness  Heart regular rate and rhythm  Abdomen soft and not tender  No rash    No CBC in a couple of days.  Most recent creatinine 0.8.    Assessment and plan:    Medical history as listed above.  Presence of a right ureteral stent.  Presents with fevers chills and urinary tract symptoms.  She has evidence of Klebsiella urinary tract infection with Klebsiella bacteremia.  The isolate does have some resistance to quinolone antibiotics.  Also resistant to cefazolin and ampicillin.    I discussed with the patient that there really are not going to be any adequate oral options for this patient.    I will  switch antibiotics to Invanz 1 g IV daily.  I have written orders for a midline catheter.  I have written home care orders for Invanz through May 1.    The patient will be traveling back to [place] and has an appointment with her urologist on Wednesday, April 27 to have the stent removed.  It is unclear whether she will have a new stent placed.    If everything can be arranged tomorrow, she could potentially travel home to [place].  She will also follow-up with her primary physician in [place].  I told her that we would be available to help with any questions.      Donald Prose, M.D.  [facility], [facility]  Pager [phone]  [phone]

## 2021-01-10 NOTE — Progress Notes (Signed)
[facility] HOSPITAL[facility]  Progress Note  Patient Info:   Date/Time: 01/10/2021 / 3:58 PM   Admit Date:01/07/2021  Patient Name:Tina Vazquez   ZOX:[id number]   PCP: Patsy Lager, MD  Attending Physician:Darian Ace, Jerelene Redden, MD     Assessment and Plan:   Sepsis: Secondary to acute pyelonephritis, with bacteremia, her blood culture came back positive for Klebsiella pneumoniae, most likely right ureter stent contamination, we will arrange for midline tomorrow, and IV Invanz for 7 days patient has an appointment in 3 days with her urologist in [place] to get her stent removed.  Acute pyelonephritis: With a known history of obstructive uropathy, status post ureteral stent placement was initially done 14 months ago, last 1 was replaced 6 months ago and it was done in [place], discussed with urologist, no immediate need for urologist intervention, patient can follow-up with her urologist in [place], she has an appointment on Wednesday.  Bacteremia: With Klebsiella pneumonia  Hypertension:Blood pressure stable off medications  Hypothyroid:Continue Synthroid  Normocytic anemia:Hemoglobin stable, continue to monitor  History of anal cancer and lymphoma:Currently in remission    DVT Prohylaxis:SEDs  Code Status:No Order  Disposition:home  Type of Admission:Inpatient  Estimated Length of Stay (including stay in the ER receiving treatment):More than 2 days    Hospital Problems:   Principal Problem:    Acute pyelonephritis  Active Problems:    SIRS (systemic inflammatory response syndrome)    Normocytic anemia    HTN (hypertension)    History of anal cancer    History of lymphoma    Obstructive uropathy    Ureteral stent retained    Subjective:   01/10/21 patient is doing well, her headache improved, denied any fever or chills  Chief Complaint:  Flank Pain and Nausea    ROS   Feeling better, denied any chest pain, no shortness of breath  Mild headache  No fever or chills  Denied any nausea, vomiting or  diarrhea      Objective:     Vitals:    01/10/21 0012 01/10/21 0423 01/10/21 0842 01/10/21 1300   BP: 127/72 150/84 124/60 132/62   Pulse: 91 95 91 90   Resp: 17 20 18 20    Temp: 98.9 F (37.2 C) 99.7 F (37.6 C) 98.6 F (37 C) 98.9 F (37.2 C)   TempSrc: Temporal Temporal Temporal Oral   SpO2: 94% 93% 97% 97%   Weight:       Height:         Physical Exam:   Physical Exam   Constitutional:   General: She is not in acute distress.  Appearance: She is well-developed. She is notdiaphoretic.   HENT:   Head: Normocephalicand atraumatic.   Nose: Nose normal.   Eyes:   General: No scleral icterus.  Conjunctiva/sclera: Conjunctivae normal.   Pupils: Pupils are equal, round, and reactive to light.   Neck:   Thyroid: No thyromegaly.   Vascular: No JVD.   Trachea: No tracheal deviation.   Cardiovascular:   Rate and Rhythm: Regular rhythm.Tachycardiapresent.   Heart sounds: Normal heart sounds.No murmurheard.  No friction rub. Nogallop.   Pulmonary:   Effort: Pulmonary effort is normal. Norespiratory distress.   Breath sounds: Normal breath sounds. Nostridor. Nowheezingor rales.   Chest:   Chest wall: No tenderness.   Abdominal:   General: Bowel sounds are normal. There is nodistension.   Palpations: Abdomen is soft. There is nomass.   Tenderness: There is abdominal tenderness. There is noguardingor rebound.  Comments: Right CVA tenderness improved  Musculoskeletal:   General: No swellingor tenderness.       Results of Labs/imaging   Labs and radiology reports have been reviewed.    Hospitalist   Signed by:   Lennox Laity, MD  01/10/2021 3:58 PM    *This note was generated by the Epic EMR system/ Dragon speech recognition and may contain inherent errors or omissions not intended by the user. Grammatical errors, random word insertions, deletions, pronoun errors and incomplete sentences are occasional consequences of this technology due to  software limitations. Not all errors are caught or corrected. If there are questions or concerns about the content of this note or information contained within the body of this dictation they should be addressed directly with the author for clarification

## 2021-01-10 NOTE — Plan of Care (Signed)
Problem: Safety  Goal: Patient will be free from injury during hospitalization  Flowsheets (Taken 01/10/2021 2015)  Patient will be free from injury during hospitalization:   Assess patient's risk for falls and implement fall prevention plan of care per policy   Provide and maintain safe environment   Use appropriate transfer methods   Ensure appropriate safety devices are available at the bedside   Include patient/ family/ care giver in decisions related to safety   Hourly rounding   Assess for patients risk for elopement and implement Elopement Risk Plan per policy   Provide alternative method of communication if needed (communication boards, writing)  Goal: Patient will be free from infection during hospitalization  Flowsheets (Taken 01/10/2021 2015)  Free from Infection during hospitalization:   Assess and monitor for signs and symptoms of infection   Monitor lab/diagnostic results   Monitor all insertion sites (i.e. indwelling lines, tubes, urinary catheters, and drains)   Encourage patient and family to use good hand hygiene technique     Problem: Pain  Goal: Pain at adequate level as identified by patient  Flowsheets (Taken 01/10/2021 2015)  Pain at adequate level as identified by patient:   Assess for risk of opioid induced respiratory depression, including snoring/sleep apnea. Alert healthcare team of risk factors identified.   Identify patient comfort function goal   Assess pain on admission, during daily assessment and/or before any "as needed" intervention(s)   Reassess pain within 30-60 minutes of any procedure/intervention, per Pain Assessment, Intervention, Reassessment (AIR) Cycle   Evaluate if patient comfort function goal is met   Evaluate patient's satisfaction with pain management progress   Offer non-pharmacological pain management interventions   Consult/collaborate with Pain Service   Consult/collaborate with Physical Therapy, Occupational Therapy, and/or Speech Therapy   Include  patient/patient care companion in decisions related to pain management as needed     Problem: Renal Instability  Goal: Fluid and electrolyte balance are achieved/maintained  Flowsheets (Taken 01/10/2021 2015)  Fluid and electrolyte balance are achieved/maintained:   Monitor intake and output every shift   Monitor/assess lab values and report abnormal values   Provide adequate hydration   Monitor daily weight   Assess for confusion/personality changes   Assess and reassess fluid and electrolyte status   Observe for seizure activity and initiate seizure precautions if indicated   Observe for cardiac arrhythmias   Monitor for muscle weakness   Follow fluid restrictions/IV/PO parameters  Goal: Nutritional intake is adequate  Flowsheets (Taken 01/10/2021 2015)  Nutritional intake is adequate:   Monitor daily weights   Assist patient with meals/food selection   Allow adequate time for meals   Encourage/perform oral hygiene as appropriate   Encourage/administer dietary supplements as ordered (i.e. tube feed, TPN, oral, OGT/NGT, supplements)   Consult/collaborate with Clinical Nutritionist   Include patient/patient care companion in decisions related to nutrition   Assess anorexia, appetite, and amount of meal/food tolerated   Consult/collaborate with Speech Therapy (swallow evaluations)  Goal: Perineal skin integrity is maintained or improved  Flowsheets (Taken 01/10/2021 2015)  Perineal skin integrity is maintained or improved:   Keep intact skin clean and dry   Apply urinary containment device as appropriate and/or per order   Use protective skin barriers to decrease potential skin breakdown   Consult/collaborate with Wound Care Nurse  Goal: Free from infection  Flowsheets (Taken 01/10/2021 2015)  Free from infection:   Monitor/assess for signs and symptoms of infection   Assess need for indwelling catheter every shift  and discuss with LIP     Problem: Infection  Goal: Free from infection  Flowsheets (Taken 01/10/2021  2015)  Free from infection:   Assess for signs/symptoms of infection   Utilize isolation precautions per protocol/policy   Assess immunization status   Consult/collaborate with Infection Preventionist   Utilize sepsis protocol

## 2021-01-11 ENCOUNTER — Encounter
Admission: EM | Disposition: A | Discharge status: Home / Self Care | Payer: Self-pay | Source: Home / Self Care | Attending: Family Medicine

## 2021-01-11 ENCOUNTER — Encounter (INDEPENDENT_AMBULATORY_CARE_PROVIDER_SITE_OTHER): Payer: Self-pay

## 2021-01-11 ENCOUNTER — Encounter (HOSPITAL_BASED_OUTPATIENT_CLINIC_OR_DEPARTMENT_OTHER): Payer: Self-pay | Admitting: Urology

## 2021-01-11 ENCOUNTER — Other Ambulatory Visit (HOSPITAL_COMMUNITY): Payer: Medicare HMO

## 2021-01-11 DIAGNOSIS — A419 Sepsis, unspecified organism: Secondary | ICD-10-CM | POA: Diagnosis present

## 2021-01-11 DIAGNOSIS — R7881 Bacteremia: Secondary | ICD-10-CM | POA: Diagnosis present

## 2021-01-11 DIAGNOSIS — Z960 Presence of urogenital implants: Secondary | ICD-10-CM | POA: Diagnosis not present

## 2021-01-11 DIAGNOSIS — Z452 Encounter for adjustment and management of vascular access device: Secondary | ICD-10-CM

## 2021-01-11 DIAGNOSIS — N10 Acute tubulo-interstitial nephritis: Secondary | ICD-10-CM | POA: Diagnosis not present

## 2021-01-11 DIAGNOSIS — I10 Essential (primary) hypertension: Secondary | ICD-10-CM | POA: Diagnosis not present

## 2021-01-11 DIAGNOSIS — B961 Klebsiella pneumoniae [K. pneumoniae] as the cause of diseases classified elsewhere: Secondary | ICD-10-CM | POA: Diagnosis not present

## 2021-01-11 HISTORY — PX: NON-TUNNELED CATH PLACEMENT (TDC): IMG2612

## 2021-01-11 HISTORY — DX: Encounter for adjustment and management of vascular access device: Z45.2

## 2021-01-11 LAB — COVID-19 (SARS-COV-2)

## 2021-01-11 SURGERY — NON-TUNNELED CATH PLACEMENT

## 2021-01-11 NOTE — Discharge Summary (Signed)
DISCHARGE NOTE    Date Time: 01/11/2021  3:26 PM  Patient Name:Tina Vazquez  GNF:[id number]  PCP: Patsy Lager, MD  Attending Physician:Lajuane Leatham Drema Pry, MD M.D.    Date of Admission:   01/07/2021    Date of Discharge:   01/11/2021    Chief Complaint:      Chief Complaint   Patient presents with   . Flank Pain   . Nausea       Reason for Admission:   Pyelonephritis [N12]  Pyuria [R82.81]  Pedal edema [R60.0]  Bladder wall thickening [N32.89]  Hydronephrosis, right [N13.30]  Acute right flank pain [R10.9]  Acute right-sided low back pain without sciatica [M54.50]  Hematuria, unspecified type [R31.9]    Discharge Diagnosis:       Lists the present on admission hospital problems  Present on Admission:  . Acute pyelonephritis  . Normocytic anemia  . HTN (hypertension)  . Obstructive uropathy  . Bacteremia  . Sepsis      Hospital Problems:  Principal Problem:    Acute pyelonephritis  Active Problems:    Normocytic anemia    HTN (hypertension)    Obstructive uropathy    Ureteral stent retained    Bacteremia    Sepsis      Problem Lists:  Patient Active Problem List   Diagnosis   . Acute pyelonephritis   . Normocytic anemia   . HTN (hypertension)   . Obstructive uropathy   . Ureteral stent retained   . Bacteremia   . Sepsis         Consult Input/Plan     Plan   IP CONSULT TO UROLOGY  IP CONSULT TO INFECTIOUS DISEASES  [facility] FACE-TO-FACE (FTF) ENCOUNTER  IP CONSULT TO SOCIAL WORK  [facility] FACE-TO-FACE (FTF) ENCOUNTER    Procedures performed:   PICC line placement    Hospital Course:   70 year old female came into the hospital complaining of several days of dysuria and right flank pain.    Patient had a history of obstructive uropathy of the right ureter about 14 months ago, she had a right ureteral stent, that had been changed every few months, last time it was changed was 6 months ago, usually her procedure is performed in [place] where she lives, she is here visiting, patient came in complaining of  dysuria and right flank pain.    ER evaluation showed that the patient has acute pyelonephritis, signs of sepsis, was started empirically on IV meropenem, urine cultures and blood culture were sent.    During her hospital stay, her first urine culture came back positive after less than 24 hours, showing gram-negative rods, final blood culture sensitivity and specificity showed Klebsiella pneumoniae, which also showed on her urine culture, discussed with infectious disease, possibly stent colonization, and stent infection.    Case was discussed with urologist on board, they recommended that the patient will go back to her urologist in [place] to get her stent removed, which was a scheduled for Wednesday.    Results were discussed with infectious disease, recommended IV Invanz for 1 week, patient had a PICC line placed, today she will be discharged home, patient is having right away to her home in [place], arranged for infusion center in [place] to deliver her antibiotics.    Physical Exam:    height is 1.6 m (5\' 3" ) and weight is 88.6 kg (195 lb 5.2 oz). Her temporal temperature is 97.7 F (36.5 C). Her  blood pressure is 143/75 and her pulse is 86. Her respiration is 18 and oxygen saturation is 96%.   Body mass index is 34.6 kg/m.  Vitals:    01/11/21 0043 01/11/21 0533 01/11/21 0835 01/11/21 1245   BP: 124/67 (!) 159/92 145/80 143/75   Pulse: 83 (!) 110 (!) 102 86   Resp: 18 18 19 18    Temp: 97 F (36.1 C) 98.6 F (37 C) 99.1 F (37.3 C) 97.7 F (36.5 C)   TempSrc: Temporal Temporal Temporal Temporal   SpO2: 100% 98% 98% 96%   Weight:       Height:         Physical Exam  Intake and Output Summary (Last 24 hours) at Date Time  No intake or output data in the 24 hours ending 01/11/21 1526    Constitutional: Patient is oriented to person, place, and time. Patient appears well-developed and well-nourished.   Head: Normocephalic and atraumatic.  Eyes- pupils equal and reactive, extraocular eye  movements intact, sclera anicteric  Ears - bilateral TM's and external ear canals normal, right ear normal, left ear normal  Nose - normal and patent, no erythema, discharge or polyps and normal nontender sinuses  Mouth - mucous membranes moist, pharynx normal without lesions  Neck: Normal range of motion. Neck supple. No JVD present. No tracheal deviation present. No thyromegaly present.   Cardiovascular: Normal rate, regular rhythm, normal heart sounds and intact distal pulses.  Exam reveals no gallop and no friction rub. No murmur heard.  Pulmonary/Chest: Effort normal and breath sounds normal. No stridor. No respiratory distress. Patient has no wheezes. No rales were present.  Exhibits no tenderness.   Abdominal: Soft. Bowel sounds are normal. Patient exhibits no distension and no mass was palpable. There is no tenderness. There is no rebound and no guarding.   Musculoskeletal: Normal range of motion. Patient exhibits no edema and no tenderness.   Lymphadenopathy:  Patient has no cervical adenopathy.   Neurological: Patient is alert and oriented to person, place, and time and has normal reflexes. No cranial nerve deficit.  Normal muscle tone. Coordination normal.   Skin: Skin is warm. No rash noted. Patient is not diaphoretic. No erythema. No pallor.   Psychiatric: Has normal mood and affect. Behavior is normal. Judgment and thought content normal.    Labs:     Results     Procedure Component Value Units Date/Time    Culture Blood Aerobic and Anaerobic [[id number]] Collected: 01/08/21 0938    Specimen: Blood, Venipuncture Updated: 01/11/21 1321    Narrative:      ORDER#: [id number]                                    ORDERED BY: MALACHIAS, ZACH  SOURCE: Blood, Venipuncture Right AC                 COLLECTED:  01/08/21 09:38  ANTIBIOTICS AT COLL.:                                RECEIVED :  01/08/21 12:44  Culture Blood Aerobic and Anaerobic        PRELIM      01/11/21 13:21  01/09/21   No Growth after 1 day/s of  incubation.  01/10/21   No Growth after 2 day/s of incubation.  01/11/21  No Growth after 3 day/s of incubation.      COVID-19 (SARS-COV-2) ([place] Standard test)- procedure/surgery: standard PCR [[id number]] Collected: 01/11/21 1216    Specimen: Nasopharyngeal Swab from Nasopharynx Updated: 01/11/21 1216    Narrative:      o Collect and clearly label specimen type:  o PREFERRED-Upper respiratory specimen: One Nasal Swab in  Transport Media.  o Hand deliver to laboratory ASAP  Screening    Urine culture [[id number]] Collected: 01/07/21 2247    Specimen: Urine, Clean Catch Updated: 01/11/21 1104    Narrative:      Indications for Urine Culture:->Other (please specify in  Comments)  ORDER#: [id number]                                    ORDERED BY: MALACHIAS, ZACH  SOURCE: Urine, Clean Catch                           COLLECTED:  01/07/21 22:47  ANTIBIOTICS AT COLL.:                                RECEIVED :  01/08/21 07:17  Culture Urine                              FINAL       01/11/21 11:04   +  01/11/21   >100,000 CFU/ML Klebsiella pneumoniae      01/11/21   70,000 - 90,000 CFU/ML Klebsiella pneumoniae               Second morphotype    _____________________________________________________________________________                                  K.pneumoniae    K.pneumoniae    ANTIBIOTICS                     MIC  INTRP      MIC  INTRP      _____________________________________________________________________________  Amoxicillin/CA                  8/4    S       <=4/2   S        Ampicillin                      >16    R  D1    >16    R  D1    Ampicillin/sulbactam           16/8    I        8/4    S        Aztreonam                       <=2    S        <=2    S        Cefazolin                       >16    R  D2    >16    R  D2    Cefepime                        <=  1    S        <=1    S        Cefoxitin                       <=4    S        <=4    S        Ceftazidime                     <=2    S        <=2    S         Ceftriaxone                      2     I  D3     2     I  D3    Cefuroxime                       8     I         8     I        Ciprofloxacin                    2     R         1     R        Ertapenem                     <=0.25   S      <=0.25   S        Gentamicin                      <=2    S        <=2    S        Levofloxacin                     1     I         1     I        Meropenem                      <=0.5   S       <=0.5   S        Nitrofurantoin                  >64    R  D4    >64    R  D4    Piperacillin/Tazobactam         4/4    S        4/4    S        Tetracycline                    >8     R        >8     R        Trimethoprim/Sulfamethoxazole  >2/38   R       >2/38   R          -----DRUG COMMENTS----------    D1:  If oral therapy is desired AND ampicillin is susceptible,  consider amoxicillin.         [place] [facility] Subcommittee Nov. 2020    D2:  Cefazolin interpretation is for uncomplicated UTI only. If oral         therapy is desired for uncomplicated UTI AND K. pneumoniae is         susceptible to cefazolin, consider cephalexin, cefdinir,         cefpodoxime, or cefprozil.         [place] [facility]committee Nov. 2020    D3:  Ceftriaxone does not predict cefdinir susceptibility.         [place] [facility]committee Nov. 2020    D4:  Nitrofurantoin should only be used for the treatment of         uncomplicated cystitis.         [place] [facility]im[facility]committee June 2015  _____________________________________________________________________________            S=SUSCEPTIBLE     I=INTERMEDIATE     R=RESISTANT                            N/S=NON-SUSCEPTIBLE  _____________________________________________________________________________            Rads:   Radiological Procedure reviewed.  CT Abdomen Pelvis WO Contrast    Result Date: 01/08/2021  HISTORY: Right flank pain TECHNIQUE: Helical CT was performed of abdomen and pelvis without IV or oral contrast.  CT ABDOMEN AND  PELVIS WITHOUT CONTRAST: There are bibasilar atelectasis. Stent is seen within right ureter. There is mild hydronephrosis and perinephric stranding of right kidney. Mild asymmetric wall thickening is seen along right lateral bladder measuring up to 1 cm in thickness. There is no radioopaque stones seen overlying the kidneys bilaterally.  There is no left-sided hydronephrosis or hydroureter.  Limited evaluation of the solid abdominal organs on this non-enhanced CT shows no gross mass.  There is no evidence of intestinal obstruction.      1. Right ureteral stent seen in place with mild hydronephrosis and perinephric stranding of right kidney 2. Asymmetric right lateral bladder wall thickening of unclear etiology. Clinical correlation recommended. Kristine Linea, MD  01/08/2021 1:44 AM    XR Chest AP Portable    Result Date: 01/08/2021  HISTORY: Shortness of breath. COMPARISON: Chest radiograph dated one day prior. TECHNIQUE:  AP chest FINDINGS: Mild diffuse interstitial prominence. Heart is not enlarged. No focal airspace opacity or pleural effusion. No evidence of pneumothorax. Degenerative changes of the bilateral shoulder joints.     1. Mild diffuse interstitial prominence may suggest pulmonary edema. Judd Gaudier, MD  01/08/2021 4:56 AM      Discharge Medications:     Current Discharge Medication List        Current Discharge Medication List      CONTINUE these medications which have NOT CHANGED    Details   B Complex-C-E-Zn (MYBEC PO) Take by mouth      levothyroxine (SYNTHROID) 50 MCG tablet Take 50 mcg by mouth Once a day at 6:00am      Meloxicam 10 MG Cap Take 15 mg by mouth      Turmeric 500 MG Cap Take by mouth           Current Discharge Medication List        Current Discharge Medication List        Discharge Instructions:   Pyelonephritis   Sepsis   Bacteremia   home on IV antibiotics  Pending Labs:         Discharge Destination:   Home    Condition at Discharge :   Guarded    Time spent for Discharge  Care:   45 minutes    Follow-up with PCP:   1 week    Signed by: Lennox Laity, MD, MD

## 2021-01-11 NOTE — Progress Notes (Signed)
[facility] - [facility] [address] PROGRESSIVE CARE   Patient Name: [facility]   Attending Physician: Lennox Laity, MD   Today's date:   01/11/2021 LOS: 3 days   Expected Discharge Date 01/11/2021    Quick  Assessment:                                                              ReAdmit Risk Score: 5.85    Physical Discharge Disposition: Home, Home Health        Name of Home Health Agency Placement: (P)  (Agency arranged through [employer] in [place].)           Name of Infusion Company Placement: (P) Other (comment) ([facility])              Mode of Transportation: (P) Car     Patient/Family/POA notified of transfer plan: (P) Yes  Patient agreeable to discharge plan/expected d/c date?: (P) Yes  Family/POA agreeable to discharge plan/expected d/c date?: (P) Yes  Bedside nurse notified of transport plan?: (P) Yes                              Provider Notifications:   Bedside RN and Attending MD notified; all are in agreement with discharge plan.           Neldon Labella. Terel Bann, BSN, RN, CCM  RN Case Manager II  [facility]    P: [phone]   F: [phone]  [email or web address]

## 2021-01-11 NOTE — Discharge Instr - AVS First Page (Addendum)
Reason for your Hospital Admission:  Bacteremia      Instructions for after your discharge:  Patient has an appointment to follow-up with her urologist [place]   For now she will continue with IV antibiotics through PICC line for 1 week, last day is  May 1st        Bacteremia, Suspected (Adult)  Bacteremia is a bacterial infection that has spread to the bloodstream. This is serious because it can cause a lot of harm to the body. It can spread to other organs, including the kidneys, brain, and lungs. Bacteremia that spreads and harms other parts of the body is called sepsis.   You will have lab tests and imaging tests. The lab tests will include blood cultures to check for bacteremia. They will help show the type of bacteria that you have. You will likely be given antibiotics before the results of the blood cultures are known.   Causes  Bacteremia often starts with an infection in 1 area (local), but it then spreads to the blood. Almost any type of infection can cause bacteremia. This includes:   Urinary tract infection  Skin infection  Gastrointestinal problem  Infection after surgery  Lung infection (pneumonia)  Symptoms  At first symptoms may seem like any local infection or illness. But then they get worse. Symptoms can include:   Fever and chills  Loss of appetite  Nausea or vomiting  Trouble breathing or fast breathing  Fast heart rate  Feeling lightheaded or faint  Skin rashes or blotches  Confusion, severe sleepiness, or loss of consciousness  Home care  People with bacteremia are most often treated in the hospital. After the most severe part of the illness is better, you may be sent home to complete your treatment.   When caring for yourself at home:   Rest at home for the first 2 to 3 days. When resuming activity, don't let yourself become too tired.  You can take acetaminophen or ibuprofen for pain, unless you were given a different pain medicine to use. Talk with  your healthcare provider before using these medicines if you have chronic liver or kidney disease. Talk with your provider if you have had a stomach ulcer or digestive bleeding. Also talk to your provider if you are taking medicine to prevent blood clots.  If you were given antibiotics, take them until they are used up, or your healthcare provider tells you to stop. It's important to finish the antibiotics even though you feel better. This is to make sure the infection has cleared.  Your appetite may be poor, so a light diet is fine. Drink plenty of fluids (6 to 8 glasses of fluid per day). This includes water, soft drinks, sports drinks, juices, tea, or soup.  Follow-up care  Follow up with your healthcare provider, or as advised.   Once the results of the blood culture are known, your healthcare provider may change your antibiotic. You can call for the results.   If you had X-rays, a CT scan, or an ultrasound, a specialist will look at them. You will be told of any results that may affect your care.   Call 911  Call 911 if you have any of these:   Wheezing or trouble breathing  Trouble swallowing  Chest pain  Confusion or sudden change in behavior  Extreme drowsiness or trouble awakening  Fainting or loss of consciousness  Fast heart rate  Low blood pressure  Vomiting blood, or large amounts of blood in stool  Seizure  When to get medical advice  Call your healthcare provider or get other medical care right away if you have any of these:   Cough with lots of colored mucus, or blood in your mucus  Severe headache  Severe face, neck, throat, or ear pain  Pain in the abdomen  Weakness, dizziness, repeated vomiting, or diarrhea  Joint pain or a new rash  Burning when urinating  Fever of 100.45F (38C) or higher, or as directed by your healthcare provider  [employer] last reviewed this educational content on 12/19/2018     2000-2022 [employer], [place]. All rights reserved. This information is not intended as a  substitute for professional medical care. Always follow your healthcare professional's instructions.      Home Health Referral                                                                            Referral from Mechele Collin [phone] (Case Manager) for home health care upon discharge.     By [employer], the patient has the right to freely choose a home care provider.  Arrangements have been made with:     A company of the patients choosing. We have supplied the patient with a listing of providers in your area who asked to be included and participate in Medicare.  [facility], a home care agency that provides both adult home care services which is a wholly owned and operated by [employer] and participates in [employer]  The preferred provider of your insurance company. Choosing a home care provider other than your insurance company's preferred provider may affect your insurance coverage.           Home Health Discharge Information      Your doctor (Dr Montine Circle and Dr Larry Sierras) has ordered Antibiotic Infusion in-home service(s) for you while you recuperate at home, to assist you in the transition from hospital to home.        The agency that you or your representative chose to provide the service:   ][facility])        The Infusion Company:   ][facility] [phone]  Infusion:          Invanz 1 gm daily until Jan 17, 2021        Start on: Date: Day after discharge   [facility] [place] [facility]                                       Elonda Husky (Delos Haring liaison) [phone]     Addendum:  Per Lowella Curb [facility], meds/supplies to be delivered to [facility] this evening; RN [facility] schedule for home visit IV infusion teach between 1 to 2 pm 01/12/2021        The above services were set up by:  Julien Girt, RN  [facility] Liaison)       Additional comments:   If you have not heard from your home health agency within 24-48  hours  after discharge please call your agency  ] to arrange a time for your first visit.  For any scheduling concerns or questions related to home health, such as time or date please contact your home health agency at the number listed above.

## 2021-01-11 NOTE — Progress Notes (Signed)
Home Health face-to-face (FTF) Encounter (Order [id number])  Consult  Date: 01/11/2021 Department: [facility] [address] Progressive Care Ordering/Authorizing: Lennox Laity, MD           Order Information    Order Date/Time Release Date/Time Start Date/Time End Date/Time   01/11/21 03:04 PM None 01/11/21 03:04 PM 01/11/21 03:04 PM       Order Details    Frequency Duration Priority Order Class   Once 1 occurrence Routine Hospital Performed               Standing Order Information    Remaining Occurrences Interval Last Released     0/1 Once 01/11/2021                 Provider Information    Ordering User Ordering Provider Authorizing Provider   Uy-Le, Berton Mount, RN Aswad, Jerelene Redden, MD Lennox Laity, MD   Attending Provider(s) Admitting Provider PCP   Claiborne Rigg, Polo Riley, MD; Dorthula Nettles, MD; Lennox Laity, MD Dorthula Nettles, MD Patsy Lager, MD     Verbal Order Info    Action Created on Order Mode Entered by Responsible Provider Signed by Signed on   Ordering 01/11/21 1504 Telephone with readback Uy-Le, Berton Mount, RN Lennox Laity, MD             Comments    Home F2F for IV antibiotics on separate order by Dr Larry Sierras     Flush order:   5 ml Normal saline flush before and after each use.             Home Health face-to-face (FTF) Encounter: Patient Communication    Not Released Not seen         Order Questions    Question Answer   Date I saw the patient face-to-face: 01/11/2021   Evidence this patient is homebound because: O. N/A DME only   Medical conditions that necessitate Home Health care: M. N/A DME only. No skilled services needed   Per clinical findings, following services are medically necessary: DME   DME Other (add in comment)   IVs IV Antibiotics                   Process Instructions    Please select Home Care Services medically necessary.     Based on the above findings, I certify that this patient is confined to the home and needs intermittent skilled nursing care,  physical therapry and / or speech therapy or continues to need occupational therapy. The patient is under my care, and I have initiated the establishment of the plan of care. This patient will be followed by a physician who will periodically review the plan of care.      Collection Information            Consult Order Info    ID Description Priority Start Date Start Time   [id number] Home Health face-to-face (FTF) Encounter Routine 01/11/2021 3:04 PM   Provider Specialty Referred to   ______________________________________ _____________________________________                         Verbal Order Info    Action Created on Order Mode Entered by Responsible Provider Signed by Signed on   Ordering 01/11/21 1504 Telephone with readback Uy-Le, Berton Mount, RN Lennox Laity, MD             Patient Information  Patient Name   Tina Vazquez, Tina Vazquez Legal Sex   Female DOB   Nov 22, 1950       Additional Information    Associated Reports External References   Priority and Order Details [employer]

## 2021-01-11 NOTE — Progress Notes (Signed)
Initial Case Management Assessment and Discharge Planning      Situation   66 yoF admitted for Acute Pyelonephritis       Background   Assessment completed at bedside with patient.  Patient is visiting from [place] where she lives alone.  Prior to admission she was fully independent with all ADLs and IADLs.  CM verified PCP, prescription coverage and preferred pharmacy; CM made corrections in chart as needed.  Plan is to return home when medically stable.  Patient has a PCP in [place], [place] (see info below) and will use a local [facility] if a short-term prescriptions is needed.    Fully vaccinated and boosted for COVID.     Assessment   Watch for Home Health and Home IV antibiotics need.     Recommendation   CM will continue to follow for discharge planning and needs.         Neldon Labella. Nickalus Thornsberry, BSN, RN, CCM  RN Case Manager II  [facility]    P: [phone]   F: [phone]  [email or web address] [email or web address]         01/11/21 0944   Patient Type   Within 30 Days of Previous Admission? No   Healthcare Decisions   Interviewed: Patient   Orientation/Decision Making Abilities of Patient Alert and Oriented x3, able to make decisions   Advance Directive Patient has advance directive, copy not in chart   Healthcare Agent Appointed No   Prior to admission   Prior level of function Independent with ADLs;Ambulates independently   Type of Residence Private residence   Living Arrangements Alone   How do you get to your MD appointments? Self   How do you get your groceries? Self   Who fixes your meals? Self   Who does your laundry? Self   Who picks up your prescriptions? Self   Dressing Independent   Grooming Independent   Feeding Independent   Bathing Independent   Toileting Independent   Discharge Planning   Support Systems Family members;Friends/neighbors   Patient expects to be discharged to: Home   Anticipated [place] plan discussed with: Same as interviewed   Mode of transportation: Private car (friend)   Does the patient have  perscription coverage? Yes   Consults/Providers   PT Evaluation Needed 1   OT Evalulation Needed 1   SLP Evaluation Needed 2   Correct PCP listed in Epic? Yes   Family and PCP   PCP on file was verified as the current PCP? Provider not on file   In case you are admitted, transferred or discharged, would like family notified? No   In case you are admitted, transferred or discharged, would like your PCP notified? No   PCP - first name Burna Mortimer   PCP - middle name K   PCP - last name Panosh   PCP - city [place]   PCP - state [place]   PCP - phone  [phone]   PCP - NPI [phone]   PCP - [phone]   Important Message from Medicare Notice   Patient received 1st IMM Letter? Yes   Date of most recent IMM given: 01/08/21

## 2021-01-11 NOTE — Plan of Care (Signed)
Problem: Safety  Goal: Patient will be free from injury during hospitalization  Flowsheets (Taken 01/11/2021 0949)  Patient will be free from injury during hospitalization:   Assess patient's risk for falls and implement fall prevention plan of care per policy   Provide and maintain safe environment   Include patient/ family/ care giver in decisions related to safety   Use appropriate transfer methods   Ensure appropriate safety devices are available at the bedside   Hourly rounding   Assess for patients risk for elopement and implement Elopement Risk Plan per policy   Provide alternative method of communication if needed (communication boards, writing)  Goal: Patient will be free from infection during hospitalization  Flowsheets (Taken 01/11/2021 0949)  Free from Infection during hospitalization:   Assess and monitor for signs and symptoms of infection   Monitor lab/diagnostic results   Monitor all insertion sites (i.e. indwelling lines, tubes, urinary catheters, and drains)   Encourage patient and family to use good hand hygiene technique     Problem: Pain  Goal: Pain at adequate level as identified by patient  Flowsheets (Taken 01/11/2021 0949)  Pain at adequate level as identified by patient:   Identify patient comfort function goal   Assess pain on admission, during daily assessment and/or before any "as needed" intervention(s)   Assess for risk of opioid induced respiratory depression, including snoring/sleep apnea. Alert healthcare team of risk factors identified.   Reassess pain within 30-60 minutes of any procedure/intervention, per Pain Assessment, Intervention, Reassessment (AIR) Cycle   Evaluate if patient comfort function goal is met   Evaluate patient's satisfaction with pain management progress   Offer non-pharmacological pain management interventions   Consult/collaborate with Pain Service   Include patient/patient care companion in decisions related to pain management as needed   Consult/collaborate  with Physical Therapy, Occupational Therapy, and/or Speech Therapy     Problem: Side Effects from Pain Analgesia  Goal: Patient will experience minimal side effects of analgesic therapy  Flowsheets (Taken 01/11/2021 0949)  Patient will experience minimal side effects of analgesic therapy:   Assess for changes in cognitive function   Monitor/assess patient's respiratory status (RR depth, effort, breath sounds)   Prevent/manage side effects per LIP orders (i.e. nausea, vomiting, pruritus, constipation, urinary retention, etc.)   Evaluate for opioid-induced sedation with appropriate assessment tool (i.e. POSS)     Problem: Infection  Goal: Free from infection  Flowsheets (Taken 01/11/2021 0949)  Free from infection:   Assess for signs/symptoms of infection   Utilize isolation precautions per protocol/policy   Assess immunization status   Utilize sepsis protocol   Consult/collaborate with Infection Preventionist

## 2021-01-11 NOTE — Progress Notes (Signed)
MIDLINE DICTATION SHEET      PERSON PLACING LINE:  Berniece Salines, BSN RN    INDICATION:  IV ABX    DIAGNOSIS:  Acute pyelonephritis (Klebsiella Pneumoniae) R ureter stent contamination    VEIN USED   (  XX ) Right   (   ) Left    (XX   ) Basilic (   ) Brachial  (   ) Cephalic    CATHETER:  4.5  French   single  LUMEN    LENGTH:  15 CM

## 2021-01-11 NOTE — Progress Notes (Signed)
ID CONSULTATION FOLLOW UP NOTE    Date Time: 01/11/21 12:07 PM  Patient Name: Tina Vazquez  Requesting Physician: Lennox Laity, MD     Antimicrobials   Ertapenem #2  S/p ceftriaxone, meropenem.  Lines/Foley   Midline R brachial  Assessment/Plan   47F w/ hx obstructive uropathy with R ureteral stent.    Fever. Dysuria.  Pyuria. No leukocytosis.  CXR pulm edema.  CTAP Rt hydro and perinephric stranding.  Historical cultures with pansensitive Klebsiella.  Concern for amp c induction of betalactamase as well as sulfa/FQ resistant.    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    Plan:  FTF placed for IV ertapenem infusion.  Concern for infected ureteral stent.  Recommend stent removal, will follow up with primary urologist.  Jovita Gamma my card in case she runs into issues.  Melodie Bouillon, MD  Pager [phone]  [facility]  Office: [phone]  Fax: [phone]   Microbiology        K.pneumoniae  K.pneumoniae    ANTIBIOTICS           MIC INTRP   MIC INTRP     _____________________________________________________________________________   Amoxicillin/CA         8/4  S    <=4/2  S      Ampicillin           >16  R D1  >16  R D1    Ampicillin/sulbactam      16/8  I    8/4  S      Aztreonam            <=2  S    <=2  S      Cefazolin            >16  R D2  >16  R D2    Cefepime            <=1  S    <=1  S      Cefoxitin            <=4  S    <=4  S      Ceftazidime           <=2  S    <=2  S      Ceftriaxone           2   I D3   2   I D3    Cefuroxime            8   I     8   I      Ciprofloxacin          2   R     1   R      Ertapenem            <=0.25  S   <=0.25  S      Gentamicin           <=2  S    <=2  S      Levofloxacin           1   I     1   I      Meropenem           <=0.5  S    <=0.5  S      Nitrofurantoin         >  64  R D4  >64  R D4    Piperacillin/Tazobactam     4/4  S    4/4  S      Tetracycline          >8   R    >8   R      Trimethoprim/Sulfamethoxazole >2/38  R    >2/38  R      Subjective   Feels well. No fever, chills, dysuria,flank pain. Midline placed this am.   Allergies:     Allergies   Allergen Reactions   . Citrus    . Dilaudid [Hydromorphone]    . Penicillins      Tolerated ctx       Medications:     Current Facility-Administered Medications   Medication Dose Route Frequency   . ertapenem  1,000 mg Intravenous Q24H   . lactobacillus/streptococcus  1 capsule Oral Daily   . levothyroxine  50 mcg Oral Daily at 0600       Review of Systems:   Positives in BOLD:    General: Fever, Fatigue, weight gain, weight loss, malaise  HEENT: Headache, ear ache, blurry vision, vision loss, eye pain, nasal discharge, sneezing, sore throat, lymphadenopathy  CVS: tachycardia, chest pain, shortness of breath, DOE  Lungs: Wheezing, cough, hemoptysis  Abd: Abd pain, nausea, vomiting, diarrhea, constipation, melena, hematochezia, hematemesis  Extremities: Swelling, pain, weakness  Skin: rash, ecchymosis, pruritis  Neurological: confusion, altered mental status, photophobia, weakness, numbness    Physical Exam:     Vitals:    01/11/21 0835   BP: 145/80   Pulse: (!) 102   Resp: 19   Temp: 99.1 F (37.3 C)   SpO2: 98%       General NAD   HEENT PERRL,EOMI   Heart RRR   Lung CTAB   Abdomen S/NT/ND   Extremities NO swelling   Skin No rash, no jaundice   Neuro AAOx3, no focal deficit.     Labs Reviewed:     Recent Labs   Lab 01/08/21  0458 01/07/21  2247   WBC 7.46 6.42   RBC 3.45* 4.00   Hgb 9.6* 11.0*    Hematocrit 29.9* 35.1   MCV 86.7 87.8   MCHC 32.1 31.3*   RDW 15 15   MPV 10.2 10.3   Platelets 194 261       Recent Labs   Lab 01/08/21  0458 01/07/21  2247   Sodium 136 135*   Potassium 4.1 4.7   Chloride 107 101   CO2 23 25   BUN 16.9 19.6*   Creatinine 0.8 0.9   Glucose 114* 107*   Calcium 8.5 9.4   Magnesium 1.7  --        Recent Labs   Lab 01/08/21  0458 01/07/21  2247   ALT 12 16   AST (SGOT) 21 24   Bilirubin, Total 0.2 0.4   Albumin 2.8* 3.6   Alkaline Phosphatase 64 75       Radiology   CT Abdomen Pelvis WO Contrast    Result Date: 01/08/2021   1. Right ureteral stent seen in place with mild hydronephrosis and perinephric stranding of right kidney 2. Asymmetric right lateral bladder wall thickening of unclear etiology. Clinical correlation recommended. Kristine Linea, MD  01/08/2021 1:44 AM    XR Chest AP Portable    Result Date: 01/08/2021  1. Mild diffuse interstitial prominence may suggest pulmonary edema. Judd Gaudier, MD  01/08/2021  4:56 AM

## 2021-01-11 NOTE — Progress Notes (Addendum)
Home Health Referral          Referral from Mechele Collin [phone] (Case Manager) for home health care upon discharge.    By [employer], the patient has the right to freely choose a home care provider.  Arrangements have been made with:     A company of the patients choosing. We have supplied the patient with a listing of providers in your area who asked to be included and participate in Medicare.   [facility], a home care agency that provides both adult home care services which is a wholly owned and operated by [employer] and participates in [employer]   The preferred provider of your insurance company. Choosing a home care provider other than your insurance company's preferred provider may affect your insurance coverage.        Home Health Discharge Information     Your doctor (Dr Montine Circle and Dr Larry Sierras) has ordered Antibiotic Infusion in-home service(s) for you while you recuperate at home, to assist you in the transition from hospital to home.      The agency that you or your representative chose to provide the service:   ][facility])      The Infusion Company:   ][facility]  [phone]  Infusion:  Invanz 1 gm daily until Jan 17, 2021     Start on: Date: Day after discharge   [facility] [place] [facility]      Elonda Husky (Delos Haring liaison) [phone]    Addendum:  Per Lowella Curb [facility], meds/supplies to be delivered to [facility] this evening; RN [facility] schedule for home visit IV infusion teach between 1 to 2 pm 01/12/2021      The above services were set up by:  Julien Girt, RN  [facility] Liaison)      Additional comments:   If you have not heard from your home health agency within 24-48 hours after discharge please call your agency  ] to arrange a time for your first visit.  For any scheduling concerns or questions related to home health, such as time or date please contact your home health agency at the number  listed above.           Home Health face-to-face (FTF) Encounter (Order [id number])  Consult  Date: 01/10/2021 Department: [facility] [address] Progressive Care Ordering/Authorizing: Darcel Bayley, MD           Order Information    Order Date/Time Release Date/Time Start Date/Time End Date/Time   01/10/21 01:53 PM None 01/10/21 01:54 PM 01/10/21 01:54 PM       Order Details    Frequency Duration Priority Order Class   Once 1 occurrence Routine Hospital Performed               Standing Order Information    Remaining Occurrences Interval Last Released     0/1 Once 01/10/2021                 Provider Information    Ordering User Ordering Provider Authorizing Provider   Darcel Bayley, MD Darcel Bayley, MD Darcel Bayley, MD   Attending Provider(s) Admitting Provider PCP   Claiborne Rigg Polo Riley, MD; Dorthula Nettles, MD; Lennox Laity, MD Dorthula Nettles, MD Patsy Lager, MD           Comments    Home IV antibiotic orders:     Diagnosis: Right pyelonephritis with  ureteral stent. Klebsiella bacteremia     Routine midline catheter care     Invanz 1 g IV daily through Jan 17, 2021     Weekly labs: CBC, CMP. Fax labs to [phone]     Patient will be traveling home to [place]. IV antibiotics need to be set up with a company that services her home in [place].     Donald Prose, M.D.   [facility], [facility]   Pager [phone]   [phone]             Home Health face-to-face (FTF) Encounter: Patient Communication    Not Released Not seen         Order Questions    Question Answer   Date I saw the patient face-to-face: 01/10/2021   Evidence this patient is homebound because: E. Requires assistance of another person or assistive device to ambulate > 5 feet   Medical conditions that necessitate Home Health care: F. New diagnosis & treatment requiring follow up monitoring and management   Per clinical findings, following services are medically necessary:  Skilled Nursing   Clinical findings that support the need for Skilled Nursing. SN will: E. Educate on IV antibiotic administration and monitor response to treatment   IVs IV Antibiotics                   Process Instructions    Please select Home Care Services medically necessary.     Based on the above findings, I certify that this patient is confined to the home and needs intermittent skilled nursing care, physical therapry and / or speech therapy or continues to need occupational therapy. The patient is under my care, and I have initiated the establishment of the plan of care. This patient will be followed by a physician who will periodically review the plan of care.      Collection Information            Consult Order Info    ID Description Priority Start Date Start Time   [id number] Home Health face-to-face (FTF) Encounter Routine 01/10/2021 1:54 PM   Provider Specialty Referred to   ______________________________________ _____________________________________         Acknowledgement Info    For At Acknowledged By Acknowledged On   Placing Order 01/10/21 1353 Newman Nip, RN 01/10/21 1407                           Patient Information    Patient Name   Tina Vazquez, Tina Vazquez Legal Sex   Female DOB   06-04-1951       Additional Information    Associated Reports External References   Priority and Order Details [employer]             Jamesetta Orleans, RN    Registered Nurse   Interventional Radiology   Progress Notes      Signed   Date of Service:  01/11/2021 9:19 AM   Creation Time:  01/11/2021 9:19 AM              Signed              [] Hide copied text    [] Hover for details    MIDLINE DICTATION SHEET      PERSON PLACING LINE:  Berniece Salines, BSN RN    INDICATION:  IV ABX    DIAGNOSIS:  Acute pyelonephritis (Klebsiella Pneumoniae) R ureter stent contamination  VEIN USED   (  XX ) Right   (   ) Left    (XX   ) Basilic (   ) Brachial  (   ) Cephalic    CATHETER:  4.5  French   single  LUMEN    LENGTH:   15 CM

## 2021-01-11 NOTE — Progress Notes (Signed)
Pt alert and oriented x4. Pt picked up by family and discharged to home. Discharge instructions, benefits and side effects of medications, and follow up appointments reviewed with the patient. Pt verbalized understanding and answered questions to her satisfaction. Midline left in for discharge Telemetry and IV removed. Catheter intact. Tele box returned to tele room. Pt left the unit by wheelchair with nurse to the pickup area. Pt left with all her belongings at time of discharge.

## 2021-01-12 ENCOUNTER — Encounter: Payer: Self-pay | Admitting: Diagnostic Radiology

## 2021-01-12 ENCOUNTER — Other Ambulatory Visit: Payer: Self-pay

## 2021-01-12 ENCOUNTER — Encounter (HOSPITAL_BASED_OUTPATIENT_CLINIC_OR_DEPARTMENT_OTHER): Payer: Self-pay | Admitting: Urology

## 2021-01-12 DIAGNOSIS — R7881 Bacteremia: Secondary | ICD-10-CM | POA: Diagnosis not present

## 2021-01-12 LAB — COVID-19 (SARS-COV-2): SARS CoV 2 Overall Result: NOT DETECTED

## 2021-01-12 NOTE — Progress Notes (Addendum)
ADDENDUM:  Chart reviewed by anesthesia, Dr Conrad Lodi, ok to proceed.   Spoke w/ via phone for pre-op interview--- PT Lab needs dos---- no              Lab results------ current lab results, ekg, cxr, CT abd/ pelvic dated 01-08-2021 in care everywhere COVID test ------ pt needs rapid covid test dos aware to call front desk when arrive and wait in car in parking lot until receives call from [place] at -------  0600 on 01-13-2021 NPO after MN NO Solid Food.  Clear liquids from MN until--- 0645 Med rec completed Medications to take morning of surgery ----- Synthroid Diabetic medication ----- n/a Patient instructed to bring photo id and insurance card day of surgery Patient aware to have Driver (ride ) / caregiver    for 24 hours after surgery-- sister, Lew Dawes Patient Special Instructions ----- n/a Pre-Op special Istructions -----  Pt has PICC in right arm , inserted 01-11-2021 for IV antibiotic,  No bp or stick in right arm Patient verbalized understanding of instructions that were given at this phone interview. Patient denies shortness of breath, chest pain, fever, cough at this phone interview.   Anesthesia Review:   Recent hospital ([address] [facility] in [place], [place])  admission 01-07-2021 for sepsis secondary to acute pyelonephritis with bacteremia, positive blood culture klebsiella pneumoniae.  Pt discharged 01-11-2021 with home IV antibiotic.  Pt denies fever, chills, sob even with exertion, body aches, does have lower extremity edema.  All results in care everwhere Chest x-ray : 01-08-2021 EKG :  01-08-2021 CT abd/ pelvic:  01-08-2021 Labs:  01-08-2021

## 2021-01-13 ENCOUNTER — Encounter (HOSPITAL_BASED_OUTPATIENT_CLINIC_OR_DEPARTMENT_OTHER): Payer: Self-pay | Admitting: Urology

## 2021-01-13 ENCOUNTER — Telehealth: Payer: Self-pay | Admitting: Pharmacist

## 2021-01-13 ENCOUNTER — Ambulatory Visit (HOSPITAL_BASED_OUTPATIENT_CLINIC_OR_DEPARTMENT_OTHER): Payer: Medicare HMO | Admitting: Anesthesiology

## 2021-01-13 ENCOUNTER — Other Ambulatory Visit: Payer: Self-pay

## 2021-01-13 ENCOUNTER — Encounter (HOSPITAL_BASED_OUTPATIENT_CLINIC_OR_DEPARTMENT_OTHER)
Admission: RE | Disposition: A | Discharge status: Home / Self Care | Payer: Self-pay | Source: Home / Self Care | Attending: Urology

## 2021-01-13 ENCOUNTER — Inpatient Hospital Stay (HOSPITAL_COMMUNITY)
Admission: EM | Admit: 2021-01-13 | Discharge: 2021-01-19 | DRG: 871 | Disposition: A | Discharge status: Home / Self Care | Payer: Medicare HMO | Attending: Student | Admitting: Student

## 2021-01-13 ENCOUNTER — Emergency Department (HOSPITAL_COMMUNITY): Payer: Medicare HMO

## 2021-01-13 ENCOUNTER — Encounter (HOSPITAL_COMMUNITY): Payer: Self-pay | Admitting: Internal Medicine

## 2021-01-13 ENCOUNTER — Ambulatory Visit (HOSPITAL_BASED_OUTPATIENT_CLINIC_OR_DEPARTMENT_OTHER)
Admission: RE | Admit: 2021-01-13 | Discharge: 2021-01-13 | Disposition: A | Discharge status: Home / Self Care | Payer: Medicare HMO | Source: Home / Self Care | Attending: Urology | Admitting: Urology

## 2021-01-13 DIAGNOSIS — N12 Tubulo-interstitial nephritis, not specified as acute or chronic: Secondary | ICD-10-CM | POA: Diagnosis not present

## 2021-01-13 DIAGNOSIS — Z9109 Other allergy status, other than to drugs and biological substances: Secondary | ICD-10-CM

## 2021-01-13 DIAGNOSIS — A4159 Other Gram-negative sepsis: Secondary | ICD-10-CM | POA: Diagnosis present

## 2021-01-13 DIAGNOSIS — G44209 Tension-type headache, unspecified, not intractable: Secondary | ICD-10-CM | POA: Diagnosis present

## 2021-01-13 DIAGNOSIS — Z803 Family history of malignant neoplasm of breast: Secondary | ICD-10-CM | POA: Insufficient documentation

## 2021-01-13 DIAGNOSIS — N2889 Other specified disorders of kidney and ureter: Secondary | ICD-10-CM | POA: Insufficient documentation

## 2021-01-13 DIAGNOSIS — N390 Urinary tract infection, site not specified: Secondary | ICD-10-CM | POA: Diagnosis not present

## 2021-01-13 DIAGNOSIS — R6510 Systemic inflammatory response syndrome (SIRS) of non-infectious origin without acute organ dysfunction: Secondary | ICD-10-CM | POA: Diagnosis present

## 2021-01-13 DIAGNOSIS — M545 Low back pain, unspecified: Secondary | ICD-10-CM | POA: Diagnosis present

## 2021-01-13 DIAGNOSIS — Z87440 Personal history of urinary (tract) infections: Secondary | ICD-10-CM | POA: Diagnosis not present

## 2021-01-13 DIAGNOSIS — N1339 Other hydronephrosis: Secondary | ICD-10-CM | POA: Insufficient documentation

## 2021-01-13 DIAGNOSIS — N1330 Unspecified hydronephrosis: Secondary | ICD-10-CM | POA: Diagnosis not present

## 2021-01-13 DIAGNOSIS — N139 Obstructive and reflux uropathy, unspecified: Secondary | ICD-10-CM | POA: Diagnosis present

## 2021-01-13 DIAGNOSIS — Z8041 Family history of malignant neoplasm of ovary: Secondary | ICD-10-CM | POA: Insufficient documentation

## 2021-01-13 DIAGNOSIS — R319 Hematuria, unspecified: Secondary | ICD-10-CM | POA: Diagnosis present

## 2021-01-13 DIAGNOSIS — Z808 Family history of malignant neoplasm of other organs or systems: Secondary | ICD-10-CM

## 2021-01-13 DIAGNOSIS — C7919 Secondary malignant neoplasm of other urinary organs: Secondary | ICD-10-CM | POA: Diagnosis present

## 2021-01-13 DIAGNOSIS — R0689 Other abnormalities of breathing: Secondary | ICD-10-CM | POA: Diagnosis not present

## 2021-01-13 DIAGNOSIS — Y842 Radiological procedure and radiotherapy as the cause of abnormal reaction of the patient, or of later complication, without mention of misadventure at the time of the procedure: Secondary | ICD-10-CM | POA: Diagnosis present

## 2021-01-13 DIAGNOSIS — R3915 Urgency of urination: Secondary | ICD-10-CM | POA: Insufficient documentation

## 2021-01-13 DIAGNOSIS — D649 Anemia, unspecified: Secondary | ICD-10-CM | POA: Diagnosis not present

## 2021-01-13 DIAGNOSIS — E039 Hypothyroidism, unspecified: Secondary | ICD-10-CM | POA: Diagnosis present

## 2021-01-13 DIAGNOSIS — Z801 Family history of malignant neoplasm of trachea, bronchus and lung: Secondary | ICD-10-CM | POA: Insufficient documentation

## 2021-01-13 DIAGNOSIS — N136 Pyonephrosis: Secondary | ICD-10-CM | POA: Diagnosis present

## 2021-01-13 DIAGNOSIS — G8929 Other chronic pain: Secondary | ICD-10-CM | POA: Diagnosis present

## 2021-01-13 DIAGNOSIS — J189 Pneumonia, unspecified organism: Secondary | ICD-10-CM | POA: Diagnosis present

## 2021-01-13 DIAGNOSIS — R000 Tachycardia, unspecified: Secondary | ICD-10-CM | POA: Diagnosis present

## 2021-01-13 DIAGNOSIS — R109 Unspecified abdominal pain: Secondary | ICD-10-CM | POA: Diagnosis not present

## 2021-01-13 DIAGNOSIS — N3040 Irradiation cystitis without hematuria: Secondary | ICD-10-CM | POA: Diagnosis not present

## 2021-01-13 DIAGNOSIS — Z880 Allergy status to penicillin: Secondary | ICD-10-CM | POA: Insufficient documentation

## 2021-01-13 DIAGNOSIS — A419 Sepsis, unspecified organism: Secondary | ICD-10-CM

## 2021-01-13 DIAGNOSIS — Z87891 Personal history of nicotine dependence: Secondary | ICD-10-CM | POA: Insufficient documentation

## 2021-01-13 DIAGNOSIS — Z20822 Contact with and (suspected) exposure to covid-19: Secondary | ICD-10-CM | POA: Diagnosis not present

## 2021-01-13 DIAGNOSIS — B961 Klebsiella pneumoniae [K. pneumoniae] as the cause of diseases classified elsewhere: Secondary | ICD-10-CM | POA: Diagnosis present

## 2021-01-13 DIAGNOSIS — Z923 Personal history of irradiation: Secondary | ICD-10-CM

## 2021-01-13 DIAGNOSIS — Z85828 Personal history of other malignant neoplasm of skin: Secondary | ICD-10-CM

## 2021-01-13 DIAGNOSIS — G4700 Insomnia, unspecified: Secondary | ICD-10-CM | POA: Diagnosis not present

## 2021-01-13 DIAGNOSIS — N10 Acute tubulo-interstitial nephritis: Secondary | ICD-10-CM | POA: Diagnosis not present

## 2021-01-13 DIAGNOSIS — Z9221 Personal history of antineoplastic chemotherapy: Secondary | ICD-10-CM | POA: Insufficient documentation

## 2021-01-13 DIAGNOSIS — R7881 Bacteremia: Secondary | ICD-10-CM | POA: Diagnosis not present

## 2021-01-13 DIAGNOSIS — Z807 Family history of other malignant neoplasms of lymphoid, hematopoietic and related tissues: Secondary | ICD-10-CM

## 2021-01-13 DIAGNOSIS — R21 Rash and other nonspecific skin eruption: Secondary | ICD-10-CM | POA: Diagnosis not present

## 2021-01-13 DIAGNOSIS — E871 Hypo-osmolality and hyponatremia: Secondary | ICD-10-CM | POA: Diagnosis not present

## 2021-01-13 DIAGNOSIS — R059 Cough, unspecified: Secondary | ICD-10-CM | POA: Diagnosis not present

## 2021-01-13 DIAGNOSIS — R0682 Tachypnea, not elsewhere classified: Secondary | ICD-10-CM | POA: Diagnosis present

## 2021-01-13 DIAGNOSIS — R590 Localized enlarged lymph nodes: Secondary | ICD-10-CM | POA: Diagnosis present

## 2021-01-13 DIAGNOSIS — Z888 Allergy status to other drugs, medicaments and biological substances status: Secondary | ICD-10-CM | POA: Insufficient documentation

## 2021-01-13 DIAGNOSIS — R6883 Chills (without fever): Secondary | ICD-10-CM | POA: Diagnosis not present

## 2021-01-13 DIAGNOSIS — Z79899 Other long term (current) drug therapy: Secondary | ICD-10-CM

## 2021-01-13 DIAGNOSIS — R509 Fever, unspecified: Secondary | ICD-10-CM | POA: Diagnosis not present

## 2021-01-13 DIAGNOSIS — I517 Cardiomegaly: Secondary | ICD-10-CM | POA: Diagnosis not present

## 2021-01-13 DIAGNOSIS — R8271 Bacteriuria: Secondary | ICD-10-CM | POA: Insufficient documentation

## 2021-01-13 DIAGNOSIS — T508X5A Adverse effect of diagnostic agents, initial encounter: Secondary | ICD-10-CM | POA: Diagnosis present

## 2021-01-13 DIAGNOSIS — Z85048 Personal history of other malignant neoplasm of rectum, rectosigmoid junction, and anus: Secondary | ICD-10-CM | POA: Diagnosis not present

## 2021-01-13 DIAGNOSIS — R0902 Hypoxemia: Secondary | ICD-10-CM | POA: Diagnosis not present

## 2021-01-13 DIAGNOSIS — Z791 Long term (current) use of non-steroidal anti-inflammatories (NSAID): Secondary | ICD-10-CM

## 2021-01-13 DIAGNOSIS — Z79890 Hormone replacement therapy (postmenopausal): Secondary | ICD-10-CM

## 2021-01-13 DIAGNOSIS — E876 Hypokalemia: Secondary | ICD-10-CM | POA: Diagnosis not present

## 2021-01-13 DIAGNOSIS — C210 Malignant neoplasm of anus, unspecified: Secondary | ICD-10-CM | POA: Diagnosis present

## 2021-01-13 DIAGNOSIS — N179 Acute kidney failure, unspecified: Secondary | ICD-10-CM | POA: Diagnosis present

## 2021-01-13 DIAGNOSIS — E559 Vitamin D deficiency, unspecified: Secondary | ICD-10-CM | POA: Diagnosis not present

## 2021-01-13 DIAGNOSIS — M1990 Unspecified osteoarthritis, unspecified site: Secondary | ICD-10-CM | POA: Diagnosis present

## 2021-01-13 DIAGNOSIS — C772 Secondary and unspecified malignant neoplasm of intra-abdominal lymph nodes: Secondary | ICD-10-CM | POA: Diagnosis present

## 2021-01-13 DIAGNOSIS — F1290 Cannabis use, unspecified, uncomplicated: Secondary | ICD-10-CM | POA: Diagnosis present

## 2021-01-13 HISTORY — DX: Unspecified hydronephrosis: N13.30

## 2021-01-13 HISTORY — DX: Other chronic pain: G89.29

## 2021-01-13 HISTORY — DX: Localized enlarged lymph nodes: R59.0

## 2021-01-13 HISTORY — DX: Unspecified symptoms and signs involving the genitourinary system: R39.9

## 2021-01-13 HISTORY — DX: Irradiation cystitis without hematuria: N30.40

## 2021-01-13 HISTORY — DX: Hypothyroidism, unspecified: E03.9

## 2021-01-13 HISTORY — DX: Low back pain, unspecified: M54.50

## 2021-01-13 HISTORY — DX: Presence of spectacles and contact lenses: Z97.3

## 2021-01-13 HISTORY — DX: Lymphedema, not elsewhere classified: I89.0

## 2021-01-13 HISTORY — DX: Other cervical disc degeneration, unspecified cervical region: M50.30

## 2021-01-13 HISTORY — PX: CYSTOSCOPY W/ URETERAL STENT PLACEMENT: SHX1429

## 2021-01-13 LAB — CBC WITH DIFFERENTIAL/PLATELET
Abs Immature Granulocytes: 0.11 10*3/uL — ABNORMAL HIGH (ref 0.00–0.07)
Basophils Absolute: 0 10*3/uL (ref 0.0–0.1)
Basophils Relative: 0 %
Eosinophils Absolute: 0 10*3/uL (ref 0.0–0.5)
Eosinophils Relative: 0 %
HCT: 32.8 % — ABNORMAL LOW (ref 36.0–46.0)
Hemoglobin: 10.3 g/dL — ABNORMAL LOW (ref 12.0–15.0)
Immature Granulocytes: 1 %
Lymphocytes Relative: 2 %
Lymphs Abs: 0.2 10*3/uL — ABNORMAL LOW (ref 0.7–4.0)
MCH: 27.3 pg (ref 26.0–34.0)
MCHC: 31.4 g/dL (ref 30.0–36.0)
MCV: 87 fL (ref 80.0–100.0)
Monocytes Absolute: 0.3 10*3/uL (ref 0.1–1.0)
Monocytes Relative: 3 %
Neutro Abs: 9 10*3/uL — ABNORMAL HIGH (ref 1.7–7.7)
Neutrophils Relative %: 94 %
Platelets: 235 10*3/uL (ref 150–400)
RBC: 3.77 MIL/uL — ABNORMAL LOW (ref 3.87–5.11)
RDW: 14.8 % (ref 11.5–15.5)
WBC: 9.7 10*3/uL (ref 4.0–10.5)
nRBC: 0 % (ref 0.0–0.2)

## 2021-01-13 LAB — COMPREHENSIVE METABOLIC PANEL
ALT: 40 U/L (ref 0–44)
AST: 52 U/L — ABNORMAL HIGH (ref 15–41)
Albumin: 3.2 g/dL — ABNORMAL LOW (ref 3.5–5.0)
Alkaline Phosphatase: 121 U/L (ref 38–126)
Anion gap: 11 (ref 5–15)
BUN: 19 mg/dL (ref 8–23)
CO2: 23 mmol/L (ref 22–32)
Calcium: 8.6 mg/dL — ABNORMAL LOW (ref 8.9–10.3)
Chloride: 100 mmol/L (ref 98–111)
Creatinine, Ser: 1.16 mg/dL — ABNORMAL HIGH (ref 0.44–1.00)
GFR, Estimated: 50 mL/min — ABNORMAL LOW (ref >60)
Glucose, Bld: 119 mg/dL — ABNORMAL HIGH (ref 70–99)
Potassium: 3.7 mmol/L (ref 3.5–5.1)
Sodium: 134 mmol/L — ABNORMAL LOW (ref 135–145)
Total Bilirubin: 0.3 mg/dL (ref 0.3–1.2)
Total Protein: 7.3 g/dL (ref 6.5–8.1)

## 2021-01-13 LAB — URINALYSIS, ROUTINE W REFLEX MICROSCOPIC
Bilirubin Urine: NEGATIVE
Glucose, UA: NEGATIVE mg/dL
Ketones, ur: 5 mg/dL — AB
Nitrite: NEGATIVE
Protein, ur: 100 mg/dL — AB
RBC / HPF: >50 RBC/hpf — ABNORMAL HIGH (ref 0–5)
Specific Gravity, Urine: 1.026 (ref 1.005–1.030)
WBC, UA: >50 WBC/hpf — ABNORMAL HIGH (ref 0–5)
pH: 6 (ref 5.0–8.0)

## 2021-01-13 LAB — POCT I-STAT, CHEM 8
BUN: 16 mg/dL (ref 8–23)
Calcium, Ion: 1.22 mmol/L (ref 1.15–1.40)
Chloride: 100 mmol/L (ref 98–111)
Creatinine, Ser: 0.9 mg/dL (ref 0.44–1.00)
Glucose, Bld: 100 mg/dL — ABNORMAL HIGH (ref 70–99)
HCT: 34 % — ABNORMAL LOW (ref 36.0–46.0)
Hemoglobin: 11.6 g/dL — ABNORMAL LOW (ref 12.0–15.0)
Potassium: 3.5 mmol/L (ref 3.5–5.1)
Sodium: 139 mmol/L (ref 135–145)
TCO2: 28 mmol/L (ref 22–32)

## 2021-01-13 LAB — RESP PANEL BY RT-PCR (FLU A&B, COVID) ARPGX2
Influenza A by PCR: NEGATIVE
Influenza A by PCR: NEGATIVE
Influenza B by PCR: NEGATIVE
Influenza B by PCR: NEGATIVE
SARS Coronavirus 2 by RT PCR: NEGATIVE
SARS Coronavirus 2 by RT PCR: NEGATIVE

## 2021-01-13 LAB — APTT: aPTT: 33 seconds (ref 24–36)

## 2021-01-13 LAB — PROTIME-INR
INR: 1.2 (ref 0.8–1.2)
Prothrombin Time: 15.5 seconds — ABNORMAL HIGH (ref 11.4–15.2)

## 2021-01-13 LAB — LACTIC ACID, PLASMA: Lactic Acid, Venous: 1.6 mmol/L (ref 0.5–1.9)

## 2021-01-13 SURGERY — CYSTOSCOPY, WITH RETROGRADE PYELOGRAM AND URETERAL STENT INSERTION
Anesthesia: General | Site: Ureter | Laterality: Right

## 2021-01-13 MED ORDER — FENTANYL CITRATE (PF) 100 MCG/2ML IJ SOLN
INTRAMUSCULAR | Status: AC
  Filled 2021-01-13: qty 2

## 2021-01-13 MED ORDER — SODIUM CHLORIDE 0.9 % IR SOLN
Status: DC | PRN
  Administered 2021-01-13: 3000 mL via INTRAVESICAL

## 2021-01-13 MED ORDER — ONDANSETRON HCL 4 MG/2ML IJ SOLN
4.0000 mg | Freq: Once | INTRAMUSCULAR | Status: AC
  Administered 2021-01-13: 4 mg via INTRAVENOUS

## 2021-01-13 MED ORDER — SODIUM CHLORIDE 0.9 % IV SOLN
1.0000 g | Freq: Once | INTRAVENOUS | Status: DC
  Filled 2021-01-13: qty 1

## 2021-01-13 MED ORDER — LACTATED RINGERS IV SOLN: INTRAVENOUS | Status: DC

## 2021-01-13 MED ORDER — FENTANYL CITRATE (PF) 100 MCG/2ML IJ SOLN
50.0000 ug | Freq: Once | INTRAMUSCULAR | Status: AC
  Administered 2021-01-13: 50 ug via INTRAVENOUS

## 2021-01-13 MED ORDER — SODIUM CHLORIDE 0.9 % IV SOLN: INTRAVENOUS | Status: DC

## 2021-01-13 MED ORDER — MORPHINE SULFATE (PF) 4 MG/ML IV SOLN
4.0000 mg | Freq: Once | INTRAVENOUS | Status: AC
  Administered 2021-01-13: 4 mg via INTRAVENOUS
  Filled 2021-01-13: qty 1

## 2021-01-13 MED ORDER — ENSURE ENLIVE PO LIQD
237.0000 mL | Freq: Two times a day (BID) | ORAL | Status: DC
  Administered 2021-01-14 – 2021-01-17 (×5): 237 mL via ORAL

## 2021-01-13 MED ORDER — SODIUM CHLORIDE 0.9 % IV BOLUS (SEPSIS)
1000.0000 mL | Freq: Once | INTRAVENOUS | Status: AC
  Administered 2021-01-13: 1000 mL via INTRAVENOUS

## 2021-01-13 MED ORDER — FENTANYL CITRATE (PF) 100 MCG/2ML IJ SOLN
INTRAMUSCULAR | Status: DC | PRN
  Administered 2021-01-13 (×4): 50 ug via INTRAVENOUS

## 2021-01-13 MED ORDER — ONDANSETRON HCL 4 MG/2ML IJ SOLN
INTRAMUSCULAR | Status: DC | PRN
  Administered 2021-01-13: 4 mg via INTRAVENOUS

## 2021-01-13 MED ORDER — KETOROLAC TROMETHAMINE 30 MG/ML IJ SOLN
INTRAMUSCULAR | Status: DC | PRN
  Administered 2021-01-13: 30 mg via INTRAVENOUS

## 2021-01-13 MED ORDER — PROPOFOL 10 MG/ML IV BOLUS
INTRAVENOUS | Status: AC
  Filled 2021-01-13: qty 20

## 2021-01-13 MED ORDER — LEVOTHYROXINE SODIUM 50 MCG PO TABS
50.0000 ug | ORAL_TABLET | Freq: Every day | ORAL | Status: DC
  Administered 2021-01-14 – 2021-01-19 (×5): 50 ug via ORAL
  Filled 2021-01-13 (×6): qty 1

## 2021-01-13 MED ORDER — TRAMADOL HCL 50 MG PO TABS: 50.0000 mg | ORAL_TABLET | Freq: Four times a day (QID) | ORAL | 0 refills | Status: DC | PRN

## 2021-01-13 MED ORDER — ONDANSETRON HCL 4 MG/2ML IJ SOLN
INTRAMUSCULAR | Status: AC
  Filled 2021-01-13: qty 2

## 2021-01-13 MED ORDER — PROPOFOL 10 MG/ML IV BOLUS
INTRAVENOUS | Status: DC | PRN
  Administered 2021-01-13: 200 mg via INTRAVENOUS

## 2021-01-13 MED ORDER — ACETAMINOPHEN 325 MG PO TABS
650.0000 mg | ORAL_TABLET | Freq: Four times a day (QID) | ORAL | Status: DC | PRN
  Administered 2021-01-13 – 2021-01-18 (×14): 650 mg via ORAL
  Filled 2021-01-13 (×14): qty 2

## 2021-01-13 MED ORDER — DEXAMETHASONE SODIUM PHOSPHATE 10 MG/ML IJ SOLN
INTRAMUSCULAR | Status: AC
  Filled 2021-01-13: qty 1

## 2021-01-13 MED ORDER — IOHEXOL 300 MG/ML  SOLN
INTRAMUSCULAR | Status: DC | PRN
  Administered 2021-01-13: 30 mL via URETHRAL

## 2021-01-13 MED ORDER — GENTAMICIN SULFATE 40 MG/ML IJ SOLN
340.0000 mg | INTRAVENOUS | Status: AC
  Administered 2021-01-13: 340 mg via INTRAVENOUS
  Filled 2021-01-13: qty 8.5

## 2021-01-13 MED ORDER — SODIUM CHLORIDE 0.9 % IV SOLN
1.0000 g | INTRAVENOUS | Status: DC
  Administered 2021-01-14: 1000 mg via INTRAVENOUS
  Filled 2021-01-13: qty 1

## 2021-01-13 MED ORDER — ENOXAPARIN SODIUM 40 MG/0.4ML ~~LOC~~ SOLN
40.0000 mg | SUBCUTANEOUS | Status: DC
  Administered 2021-01-14 – 2021-01-15 (×2): 40 mg via SUBCUTANEOUS
  Filled 2021-01-13 (×2): qty 0.4

## 2021-01-13 MED ORDER — LIDOCAINE 2% (20 MG/ML) 5 ML SYRINGE
INTRAMUSCULAR | Status: DC | PRN
  Administered 2021-01-13: 100 mg via INTRAVENOUS

## 2021-01-13 MED ORDER — DEXAMETHASONE SODIUM PHOSPHATE 4 MG/ML IJ SOLN
INTRAMUSCULAR | Status: DC | PRN
  Administered 2021-01-13: 5 mg via INTRAVENOUS

## 2021-01-13 MED ORDER — LIDOCAINE 2% (20 MG/ML) 5 ML SYRINGE
INTRAMUSCULAR | Status: AC
  Filled 2021-01-13: qty 5

## 2021-01-13 SURGICAL SUPPLY — 24 items
BAG DRAIN URO-CYSTO SKYTR STRL (DRAIN) ×2 IMPLANT
BAG DRN UROCATH (DRAIN) ×1
BASKET LASER NITINOL 1.9FR (BASKET) IMPLANT
BSKT STON RTRVL 120 1.9FR (BASKET)
CATH INTERMIT  6FR 70CM (CATHETERS) IMPLANT
CLOTH BEACON ORANGE TIMEOUT ST (SAFETY) ×2 IMPLANT
FIBER LASER FLEXIVA 365 (UROLOGICAL SUPPLIES) IMPLANT
GLOVE SURG ENC MOIS LTX SZ7.5 (GLOVE) ×2 IMPLANT
GOWN STRL REUS W/TWL LRG LVL3 (GOWN DISPOSABLE) ×2 IMPLANT
GUIDEWIRE ANG ZIPWIRE 038X150 (WIRE) ×2 IMPLANT
GUIDEWIRE STR DUAL SENSOR (WIRE) ×2 IMPLANT
IV NS 1000ML (IV SOLUTION) ×2
IV NS 1000ML BAXH (IV SOLUTION) ×1 IMPLANT
IV NS IRRIG 3000ML ARTHROMATIC (IV SOLUTION) ×2 IMPLANT
KIT TURNOVER CYSTO (KITS) ×2 IMPLANT
MANIFOLD NEPTUNE II (INSTRUMENTS) ×2 IMPLANT
NS IRRIG 500ML POUR BTL (IV SOLUTION) ×2 IMPLANT
PACK CYSTO (CUSTOM PROCEDURE TRAY) ×2 IMPLANT
SYR 10ML LL (SYRINGE) ×2 IMPLANT
TRACTIP FLEXIVA PULS ID 200XHI (Laser) IMPLANT
TRACTIP FLEXIVA PULSE ID 200 (Laser)
TUBE CONNECTING 12X1/4 (SUCTIONS) ×2 IMPLANT
TUBE FEEDING 8FR 16IN STR KANG (MISCELLANEOUS) IMPLANT
TUBING UROLOGY SET (TUBING) ×2 IMPLANT

## 2021-01-13 NOTE — ED Triage Notes (Signed)
BIBA  Per EMS: Pt coming from home with complaints of chill and body aches that began at 3pm today. Pt had Outpt kidney stent removal this morning. Pt on IV antibiotics at home.  PICC R arm  92 RA 96% 3L  101.5 temp  1000mg  tylenol for fever  257mcg fentanyl  250 mL fluids  Hospitalized for pian management x1 wk  Kidney stent removal this morning  138/92 112 HR 29 capnog  95% 3L O2  103 CBG  Hx Anal, thyroid, & bladder CA

## 2021-01-13 NOTE — Anesthesia Procedure Notes (Signed)
Procedure Name: LMA Insertion Date/Time: 01/13/2021 9:33 AM Performed by: Justice Rocher, CRNA Pre-anesthesia Checklist: Patient identified, Emergency Drugs available, Suction available, Patient being monitored and Timeout performed Patient Re-evaluated:Patient Re-evaluated prior to induction Oxygen Delivery Method: Circle system utilized Preoxygenation: Pre-oxygenation with 100% oxygen Induction Type: IV induction Ventilation: Mask ventilation without difficulty LMA: LMA inserted LMA Size: 4.0 Number of attempts: 1 Airway Equipment and Method: Bite block Placement Confirmation: positive ETCO2,  breath sounds checked- equal and bilateral and CO2 detector Tube secured with: Tape Dental Injury: Teeth and Oropharynx as per pre-operative assessment

## 2021-01-13 NOTE — Discharge Instructions (Signed)
  Post Anesthesia Home Care Instructions  Activity: Get plenty of rest for the remainder of the day. A responsible adult should stay with you for 24 hours following the procedure.  For the next 24 hours, DO NOT: -Drive a car -Paediatric nurse -Drink alcoholic beverages -Take any medication unless instructed by your physician -Make any legal decisions or sign important papers.  Meals: Start with liquid foods such as gelatin or soup. Progress to regular foods as tolerated. Avoid greasy, spicy, heavy foods. If nausea and/or vomiting occur, drink only clear liquids until the nausea and/or vomiting subsides. Call your physician if vomiting continues.  Special Instructions/Symptoms: Your throat may feel dry or sore from the anesthesia or the breathing tube placed in your throat during surgery. If this causes discomfort, gargle with warm salt water. The discomfort should disappear within 24 hours.   1 - You may have urinary urgency (bladder spasms) and bloody urine on / off for up to 2 weeks. This is normal.  2 - Call MD or go to ER for fever >102, severe pain / nausea / vomiting not relieved by medications, or acute change in medical status  No ibuprofen, Advil, Aleve, Motrin, or naproxen until after 4:00 pm today if needed.

## 2021-01-13 NOTE — ED Provider Notes (Signed)
[facility] DEPT Provider Note   CSN: [id number] Arrival date & time: 01/13/21  1827     History No chief complaint on file.   Ashley Moore is a 70 y.o. female history of obstructive uropathy here presenting with fever.  Patient was just admitted to the hospital at [place] while she was going on vacation and she had pyelonephritis secondary to Klebsiella.  She was also bacteremic at that time. Patient was discharged from the hospital yesterday and had her ureteral stent removed today. She is already on Invanz and was given a dose today and also given an extra dose prior to the operation.  Patient states that she was doing fine until 3 PM.  She spiked a fever of 101.  Per EMS it was 103.  She was given Tylenol prior to arrival.  Patient states that she has chills as well.  The history is provided by the patient.       Past Medical History:  Diagnosis Date  . Arthritis   . Bowen's disease    excised 1992  . Chronic low back pain   . Chronic radiation cystitis   . DDD (degenerative disc disease), cervical   . Family history of breast cancer   . Family history of lung cancer   . Family history of non-Hodgkin's lymphoma   . Family history of ovarian cancer   . Headache   . Hx of varicella   . Hydronephrosis of right kidney    urologist--- dr Tresa Moore,  malignant,  treated with ureter stent  . Hypothyroidism    followed by pcp  . Lower urinary tract symptoms (LUTS)    01-12-2021  per pt treated with accupuncture at dr Tresa Moore office  . Lymphedema of both lower extremities   . PICC (peripherally inserted central catheter) in place 01/11/2021   placed at [facility] in [place], [place] for IV antibiotics  . Positive blood culture 01/08/2021   Klebsiella Pneumaniae,  treated with daily IV antibiotic  . 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 Sepsis due to Klebsiella pneumoniae [facility]) 01/07/2021   pt admitted to [facility] in [place],  dx sepsis secondary to acute pyelonephritis with bacteremia , positive blood culture,  discharged 01-11-2021 home daily IV antibiotic  . Wears glasses     Patient Active Problem List   Diagnosis Date Noted  . 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 Genetic testing 01/27/2020  . Family history of breast cancer   . Family history of lung cancer   . Family history of ovarian cancer   . Family history of non-Hodgkin's lymphoma   . 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 Port catheter in place 11/21/2016  . 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 Anal cancer ([place]) 11/03/2016  . Cystitis 05/06/2015  . Degenerative cervical disc 04/08/2015  . Trigger point of neck 03/26/2015  . Pain in joint, ankle and foot 04/23/2013  . Visit for preventive health examination 02/13/2013  . Routine gynecological examination 02/13/2013  . Family history of breast cancer in first degree relative 02/13/2013  . Rash and nonspecific skin eruption 02/13/2013  . 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 VITAMIN D DEFICIENCY  06/25/2008  . NECK PAIN 06/02/2008  . FOOT PAIN 06/02/2008  . Osteopenia 06/02/2008    Past Surgical History:  Procedure Laterality Date  . BUNIONECTOMY Right yrs ago  . CYSTOSCOPY W/ URETERAL STENT PLACEMENT Right 06/12/2020   Procedure: CYSTOSCOPY WITH RETROGRADE PYELOGRAM/URETERAL STENT EXCHANGE;  Surgeon: Alexis Frock, MD;  Location: [facility];  Service: Urology;  Laterality: Right;  . CYSTOSCOPY WITH RETROGRADE PYELOGRAM, URETEROSCOPY AND STENT PLACEMENT Right 01/28/2020   Procedure: CYSTOSCOPY WITH RETROGRADE  PYELOGRAM, URETEROSCOPY AND STENT PLACEMENT;  Surgeon: Alexis Frock, MD;  Location: [facility];  Service: Urology;  Laterality: Right;  . IR GENERIC HISTORICAL  11/14/2016   IR US GUIDE VASC ACCESS RIGHT 11/14/2016 [facility]  . IR GENERIC HISTORICAL  11/14/2016   IR FLUORO GUIDE CV LINE RIGHT 11/14/2016 [facility]  . IR GENERIC HISTORICAL  12/12/2016   IR US GUIDE VASC ACCESS RIGHT 12/12/2016 [name], MD [facility]  . IR GENERIC HISTORICAL  12/12/2016   IR FLUORO GUIDE CV LINE RIGHT 12/12/2016 [name], MD [facility]  . SKIN CANCER EXCISION  1990   bowens disease     OB History   No obstetric history on file.    Obstetric Comments  Last mammogram: 09/2018, BIRADS 1 Bone density: 07/2018 Last pap: 2018 - negative, HPV +        Family History  Problem Relation Age of Onset  . Lung cancer Mother        lung  . Breast cancer Mother 74  . Ovarian cancer Mother 73  . Pancreatitis Father   . Breast cancer Sister 88  . Breast cancer Maternal Grandmother 34       breast   . Breast cancer Maternal Aunt 75       breast  . Melanoma Sister   . Breast cancer Sister 12  . Non-Hodgkin's lymphoma Paternal Grandmother     Social History   Tobacco Use  . Smoking status: Former Smoker    Packs/day: 1.00    Years: 25.00    Pack years: 25.00    Types: Cigarettes    Quit date: 06/20/1995    Years since quitting: 25.5  . Smokeless tobacco: Never Used  Vaping Use  . Vaping Use: Never used  Substance Use Topics  . Alcohol use: Not Currently    Alcohol/week: 0.0 standard drinks    Comment: occasional wine  . Drug use: Yes    Types: Marijuana    Comment: 01-12-2021  per pt once a week    Home Medications Prior to Admission medications   Medication Sig Start Date End Date Taking? Authorizing Provider  acetaminophen (TYLENOL) 500 MG tablet Take 1,000 mg by mouth daily.   Yes [provider]  B Complex-C CAPS Take 1 capsule by mouth daily.   Yes [provider]  calcium-vitamin D (OSCAL WITH D) 500-200 MG-UNIT tablet Take 1 tablet by mouth daily.    Yes [provider]  conjugated estrogens (PREMARIN) vaginal cream Place 1 Applicatorful vaginally daily.   Yes [provider]  ertapenem (INVANZ) 1 g injection Inject 1 g into the muscle daily. 01/12/21  Yes [provider]  levothyroxine (SYNTHROID) 50 MCG tablet TAKE 1 TABLET EVERY DAY Patient taking differently: Take 50 mcg by mouth daily before breakfast. 12/17/20  Yes Panosh, Standley Brooking, MD  meloxicam (MOBIC) 15 MG tablet TAKE 1 TABLET EVERY DAY ( APPOINTMENT IS NEEDED ) Patient taking differently: Take 15 mg by mouth  daily. 09/22/20  Yes Persons, [name], PA  Methenamine-Sodium Salicylate (CYSTEX PO) Take 1 tablet by mouth daily.   Yes [provider]  mirabegron ER (MYRBETRIQ) 25 MG TB24 tablet Take 25 mg by mouth daily.  12/12/19  Yes [provider]  Multiple Vitamins-Minerals (MULTIVITAMIN ADULT) CHEW Chew 1 capsule by mouth daily.   Yes [provider]  OVER THE COUNTER MEDICATION Place 1 drop into both eyes daily. Soothing eye wash   Yes [provider]  Turmeric 500 MG CAPS Take 500 mg by mouth daily.   Yes [provider]  traMADol (ULTRAM) 50 MG tablet Take 1-2 tablets (50-100 mg total) by mouth every 6 (six) hours as needed for moderate pain (post-operatively). Patient not taking: No sig reported 01/13/21 01/13/22  Alexis Frock, MD    Allergies    Benadryl [diphenhydramine], Citrus, Melatonin, Other, Lemon oil, and Penicillins  Review of Systems   Review of Systems  Constitutional: Positive for chills and fever.  All other systems reviewed and are negative.   Physical Exam Updated Vital Signs BP 116/65   Pulse (!) 101   Temp (!) 102.8 F (39.3 C) (Oral)   Resp (!) 25   SpO2 97%   Physical Exam Vitals and nursing note reviewed.  Constitutional:      Comments: Chronically ill  HENT:     Head:  Normocephalic.     Mouth/Throat:     Mouth: Mucous membranes are moist.  Eyes:     Extraocular Movements: Extraocular movements intact.     Pupils: Pupils are equal, round, and reactive to light.  Cardiovascular:     Rate and Rhythm: Normal rate and regular rhythm.     Pulses: Normal pulses.     Heart sounds: Normal heart sounds.  Pulmonary:     Effort: Pulmonary effort is normal.     Breath sounds: Normal breath sounds.  Abdominal:     General: Abdomen is flat.     Palpations: Abdomen is soft.     Comments: No obvious CVAT  Musculoskeletal:        General: Normal range of motion.     Cervical back: Normal range of motion and neck supple.     Comments: Right PICC line in place   Skin:    General: Skin is warm.     Capillary Refill: Capillary refill takes less than 2 seconds.  Neurological:     General: No focal deficit present.     Mental Status: She is alert and oriented to person, place, and time.  Psychiatric:        Mood and Affect: Mood normal.        Behavior: Behavior normal.     ED Results / Procedures / Treatments   Labs (all labs ordered are listed, but only abnormal results are displayed) Labs Reviewed  CBC WITH DIFFERENTIAL/PLATELET - Abnormal; Notable for the following components:      Result Value   RBC 3.77 (*)    Hemoglobin 10.3 (*)    HCT 32.8 (*)    Neutro Abs 9.0 (*)    Lymphs Abs 0.2 (*)    Abs Immature Granulocytes 0.11 (*)    All other components within normal limits  PROTIME-INR - Abnormal; Notable for the following components:   Prothrombin Time 15.5 (*)    All other components within normal limits  URINALYSIS, ROUTINE W REFLEX MICROSCOPIC - Abnormal; Notable for the following components:   APPearance CLOUDY (*)  Hgb urine dipstick LARGE (*)    Ketones, ur 5 (*)    Protein, ur 100 (*)    Leukocytes,Ua LARGE (*)    RBC / HPF >50 (*)    WBC, UA >50 (*)    Bacteria, UA RARE (*)    All other components within normal limits  RESP PANEL  BY RT-PCR (FLU A&B, COVID) ARPGX2  CULTURE, BLOOD (ROUTINE X 2)  CULTURE, BLOOD (ROUTINE X 2)  URINE CULTURE  APTT  LACTIC ACID, PLASMA  LACTIC ACID, PLASMA  COMPREHENSIVE METABOLIC PANEL    EKG EKG Interpretation  Date/Time:  Wednesday January 13 2021 19:32:49 EDT Ventricular Rate:  109 PR Interval:  150 QRS Duration: 92 QT Interval:  333 QTC Calculation: 449 R Axis:   56 Text Interpretation: Sinus tachycardia Probable left atrial enlargement RSR' in V1 or V2, right VCD or RVH No significant change since last tracing Confirmed by Wandra Arthurs [id number]) on 01/13/2021 7:43:31 PM   Radiology DG Chest Port 1 View  Result Date: 01/13/2021 CLINICAL DATA:  70 year old female with concern for sepsis. EXAM: PORTABLE CHEST 1 VIEW COMPARISON:  Chest radiograph dated 01/21/2013. chest CT report dated 11/04/2020. The images and non available for viewing. FINDINGS: There is rounded prominence of the right hilum which may represent adenopathy or developing opacity. A mass is less likely as none was reported on the recent chest CT of 11/04/2020. CT may provide better evaluation if clinically indicated. There is mild eventration of the right hemidiaphragm. Mild diffuse interstitial prominence may be chronic. Atypical infection is less likely. Clinical correlation is recommended. No pleural effusion, or pneumothorax. Mild cardiomegaly. no acute osseous pathology. IMPRESSION: 1. Rounded prominence of the right hilum may represent adenopathy or developing opacity. Clinical correlation is recommended. 2. Mild diffuse interstitial prominence may be chronic. Atypical infection is less likely. Electronically Signed   By: Anner Crete M.D.   On: 01/13/2021 19:44    Procedures Procedures   Medications Ordered in ED Medications  sodium chloride 0.9 % bolus 1,000 mL (1,000 mLs Intravenous New Bag/Given 01/13/21 1930)    And  sodium chloride 0.9 % bolus 1,000 mL (1,000 mLs Intravenous New Bag/Given 01/13/21  1954)    And  sodium chloride 0.9 % bolus 1,000 mL (has no administration in time range)    ED Course  I have reviewed the triage vital signs and the nursing notes.  Pertinent labs & imaging results that were available during my care of the patient were reviewed by me and considered in my medical decision making (see chart for details).    MDM Rules/Calculators/A&P                         ULDINE DUFFIN is a 70 y.o. female here presenting with fever.  Patient had her stent removed today.  She was recently bacteremic and is still on Invanz.  She received 2 doses of IV antibiotics already prior to arrival.  Concerned that she may be bacteremic again.  Plan to get CBC and CMP and lactate and urinalysis and urine culture.  Since patient already received IV antibiotics today, will hold off on further antibiotics.   8:58 PM Patient's white blood cell count is normal and lactate is normal.  UA showed UTI.  I talked to Dr. Milus Height from urology.  He states that since patient did not have a stone and recent CT, patient does not need emergent urology consult.  She can be  admitted for IV antibiotics for pyelonephritis.     Final Clinical Impression(s) / ED Diagnoses Final diagnoses:  None    Rx / DC Orders ED Discharge Orders    None       Drenda Freeze, MD 01/13/21 2059

## 2021-01-13 NOTE — H&P (Signed)
Ashley Moore is an 70 y.o. female.    Chief Complaint: Pre-Op RIGHT Ureteral stent exchange v. Removal.  HPI:   1 - Right Malignant Ureteral Obstruction - h/o advanced anal cancer s/p surgery, chemo, XRT with new retroperitoneal adenopathy and Rt hydro on eval flank pain mid 2021. CR 0.93, UA without over infectious parameters.   Recent Course:  01/2020 - Rt 8x24 polaris stent, Cr 04/2020 0.77  05/2020 - Exchange stent  11/2020 - Cr 0.77, CT resolved adenopathy.     2 - Urinary Urgency - pt with expected but very subjectivelyi bothersome urinaru urgency after pelvic radiaiton for anal cancer and with stent in place. UCX non-clonal x several. Not much help with PO meds.   3 - Bacteruria - hospitalization 12/2020 in [place] for pyelo from MDR klebsiella. Most rect CX there and here sens cipro, gent  PMH sig for rectal cancer. NO CV disease / blood thinners. Her PCP is Teola Bradley MD. Her medical oncologist is Betsy Coder MD.     Today "Ashley Moore" is seen to proceed with cysto, right retrogarde and stent removal v. Exchannge. No interval fevers. She is on CX-specific ABX as recent GU infection via PICC. CT from referring facility also w/o sig adenopathy anymore.    Past Medical History:  Diagnosis Date  . Arthritis   . Bowen's disease    excised 1992  . Chronic low back pain   . Chronic radiation cystitis   . DDD (degenerative disc disease), cervical   . Family history of breast cancer   . Family history of lung cancer   . Family history of non-Hodgkin's lymphoma   . Family history of ovarian cancer   . Headache   . Hx of varicella   . Hydronephrosis of right kidney    urologist--- dr Tresa Moore,  malignant,  treated with ureter stent  . Hypothyroidism    followed by pcp  . Lower urinary tract symptoms (LUTS)    01-12-2021  per pt treated with accupuncture at dr Tresa Moore office  . Lymphedema of both lower extremities   . PICC (peripherally inserted central catheter) in place  01/11/2021   placed at [facility] in [place], [place] for IV antibiotics  . Positive blood culture 01/08/2021   Klebsiella Pneumaniae,  treated with daily IV antibiotic  . 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 Sepsis due to Klebsiella pneumoniae [facility]) 01/07/2021   pt admitted to [facility] in [place],  dx sepsis secondary to acute pyelonephritis with bacteremia , positive blood culture,  discharged 01-11-2021 home daily IV antibiotic  . Wears glasses     Past Surgical History:  Procedure Laterality Date  . BUNIONECTOMY Right yrs ago  . CYSTOSCOPY W/ URETERAL STENT PLACEMENT Right 06/12/2020   Procedure: CYSTOSCOPY WITH RETROGRADE PYELOGRAM/URETERAL STENT EXCHANGE;  Surgeon: Alexis Frock, MD;  Location: [facility];  Service: Urology;  Laterality: Right;  . CYSTOSCOPY WITH RETROGRADE PYELOGRAM, URETEROSCOPY AND STENT PLACEMENT Right 01/28/2020   Procedure: CYSTOSCOPY WITH RETROGRADE PYELOGRAM, URETEROSCOPY AND STENT PLACEMENT;  Surgeon: Alexis Frock, MD;  Location: [facility];  Service: Urology;  Laterality: Right;  . IR GENERIC HISTORICAL  11/14/2016   IR US GUIDE VASC ACCESS RIGHT  11/14/2016 [facility]  . IR GENERIC HISTORICAL  11/14/2016   IR FLUORO GUIDE CV LINE RIGHT 11/14/2016 [facility]  . IR GENERIC HISTORICAL  12/12/2016   IR US GUIDE VASC ACCESS RIGHT 12/12/2016 [name], MD [facility]  . IR GENERIC HISTORICAL  12/12/2016   IR FLUORO GUIDE CV LINE RIGHT 12/12/2016 [name], MD [facility]  . 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 Lung cancer Mother         lung  . Breast cancer Mother 21  . Ovarian cancer Mother 52  . Pancreatitis Father   . Breast cancer Sister 48  . Breast cancer Maternal Grandmother 30       breast   . Breast cancer Maternal Aunt 75       breast  . Melanoma Sister   . Breast cancer Sister 32  . Non-Hodgkin's lymphoma Paternal Grandmother    Social History:  reports that she quit smoking about 25 years ago. Her smoking use included cigarettes. She has a 25.00 pack-year smoking history. She has never used smokeless tobacco. She reports previous alcohol use. She reports current drug use. Drug: Marijuana.  Allergies:  Allergies  Allergen Reactions  . Benadryl [Diphenhydramine] Other (See Comments)    Tingle all over   . Melatonin     Tingle all over   . Other Diarrhea    greenbeans and citrus  . Lemon Oil Diarrhea  . Penicillins Rash    No medications prior to admission.    No results found for this or any previous visit (from the past 48 hour(s)). No results found.  Review of Systems  Constitutional: Negative for chills and fever.  Genitourinary: Positive for urgency.  All other systems reviewed and are negative.   Height 5\' 2"  (1.575 m), weight 88.5 kg. Physical Exam Vitals reviewed.  Constitutional:      Comments: Stigmata of advaned cancer and treatment.   HENT:     Head: Normocephalic.     Nose: Nose normal.  Cardiovascular:     Rate and Rhythm: Normal rate.  Pulmonary:     Effort: Pulmonary effort is normal.  Abdominal:     General: Abdomen is flat.  Genitourinary:    Comments: No CVAT at present.  Musculoskeletal:     Cervical back: Normal range of motion.  Skin:    General: Skin is warm.  Neurological:     General: No focal deficit present.     Mental Status: She is alert.      Assessment/Plan Proceed as planned with cysto, RIGHT retrograde and stent exchange v. Removal. Risks, benefits, alternatives, expected peri-op course discussed previously and reiterated today.    Alexis Frock, MD 01/13/2021, 5:34 AM

## 2021-01-13 NOTE — ED Notes (Signed)
ED TO INPATIENT HANDOFF REPORT  Name/Age/Gender Ashley Moore 70 y.o. female  Code Status Advance Directive Documentation   [place] Most Recent Value  Type of Advance Directive Living will  Pre-existing out of facility DNR order (yellow form or pink MOST form) --  "MOST" Form in Place? --      Home/SNF/Other Home   Chief Complaint SIRS (systemic inflammatory response syndrome) ([place]) [R65.10]  Level of Care/Admitting Diagnosis ED Disposition    ED Disposition Condition [place]: [place] [100102]  Level of Care: Med-Surg [16]  Covid Evaluation: Asymptomatic Screening Protocol (No Symptoms)  Diagnosis: SIRS (systemic inflammatory response syndrome) [facility]) [[id number]]  Admitting Physician: Rise Patience [phone]  Attending Physician: Rise Patience [phone]       Medical History Past Medical History:  Diagnosis Date  . Arthritis   . Bowen's disease    excised 1992  . Chronic low back pain   . Chronic radiation cystitis   . DDD (degenerative disc disease), cervical   . Family history of breast cancer   . Family history of lung cancer   . Family history of non-Hodgkin's lymphoma   . Family history of ovarian cancer   . Headache   . Hx of varicella   . Hydronephrosis of right kidney    urologist--- dr Tresa Moore,  malignant,  treated with ureter stent  . Hypothyroidism    followed by pcp  . Lower urinary tract symptoms (LUTS)    01-12-2021  per pt treated with accupuncture at dr Tresa Moore office  . Lymphedema of both lower extremities   . PICC (peripherally inserted central catheter) in place 01/11/2021   placed at [facility] in [place], [place] for IV antibiotics  . Positive blood culture 01/08/2021   Klebsiella Pneumaniae,  treated with daily IV antibiotic  . 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 Sepsis due to Klebsiella pneumoniae [facility]) 01/07/2021   pt admitted to [facility] in [place],  dx sepsis secondary to acute pyelonephritis with bacteremia , positive blood culture,  discharged 01-11-2021 home daily IV antibiotic  . Wears glasses     Allergies Allergies  Allergen Reactions  . Benadryl [Diphenhydramine] Other (See Comments)    Tingle all over   . Citrus   . Melatonin     Tingle all over   . Other Diarrhea    greenbeans and citrus  . Lemon Oil Diarrhea  . Penicillins Rash    IV Location/Drains/Wounds Patient Lines/Drains/Airways Status    Active Line/Drains/Airways    Name Placement date Placement time Site Days   Peripheral IV 01/13/21 Left Antecubital 01/13/21  1930  Antecubital  less than 1   Midline Single Lumen 16/07/37 Right Basilic 10/62/69  4854  Basilic  2   Incision (Closed) 01/13/21 Perineum 01/13/21  0941  -- less than 1          Labs/Imaging Results for orders placed or performed during the hospital encounter of 01/13/21 (from the past 48 hour(s))  Urinalysis, Routine w reflex microscopic     Status: Abnormal   Collection Time: 01/13/21  7:38 PM  Result Value Ref Range   Color, Urine YELLOW YELLOW  APPearance CLOUDY (A) CLEAR   Specific Gravity, Urine 1.026 1.005 - 1.030   pH 6.0 5.0 - 8.0   Glucose, UA NEGATIVE NEGATIVE mg/dL   Hgb urine dipstick LARGE (A) NEGATIVE   Bilirubin Urine NEGATIVE NEGATIVE   Ketones, ur 5 (A) NEGATIVE mg/dL   Protein, ur 100 (A) NEGATIVE mg/dL   Nitrite NEGATIVE NEGATIVE   Leukocytes,Ua LARGE (A) NEGATIVE   RBC / HPF >50 (H) 0 - 5 RBC/hpf   WBC, UA >50 (H) 0 - 5 WBC/hpf   Bacteria, UA RARE (A) NONE SEEN   Squamous Epithelial / LPF 0-5 0 - 5   WBC Clumps PRESENT    Mucus  PRESENT     Comment: Performed at [facility], [place] [address], [place], [place] [address]  Lactic acid, plasma     Status: None   Collection Time: 01/13/21  7:41 PM  Result Value Ref Range   Lactic Acid, Venous 1.6 0.5 - 1.9 mmol/L    Comment: Performed at [facility], [place] [address] [place], [place] [address]  Comprehensive metabolic panel     Status: Abnormal   Collection Time: 01/13/21  7:41 PM  Result Value Ref Range   Sodium 134 (L) 135 - 145 mmol/L   Potassium 3.7 3.5 - 5.1 mmol/L   Chloride 100 98 - 111 mmol/L   CO2 23 22 - 32 mmol/L   Glucose, Bld 119 (H) 70 - 99 mg/dL    Comment: Glucose reference range applies only to samples taken after fasting for at least 8 hours.   BUN 19 8 - 23 mg/dL   Creatinine, Ser 1.16 (H) 0.44 - 1.00 mg/dL   Calcium 8.6 (L) 8.9 - 10.3 mg/dL   Total Protein 7.3 6.5 - 8.1 g/dL   Albumin 3.2 (L) 3.5 - 5.0 g/dL   AST 52 (H) 15 - 41 U/L   ALT 40 0 - 44 U/L   Alkaline Phosphatase 121 38 - 126 U/L   Total Bilirubin 0.3 0.3 - 1.2 mg/dL   GFR, Estimated 50 (L) >60 mL/min    Comment: (NOTE) Calculated using the CKD-EPI Creatinine Equation (2021)    Anion gap 11 5 - 15    Comment: Performed at [facility], [place] [address], [place], [place] [address]  CBC WITH DIFFERENTIAL     Status: Abnormal   Collection Time: 01/13/21  7:41 PM  Result Value Ref Range   WBC 9.7 4.0 - 10.5 K/uL   RBC 3.77 (L) 3.87 - 5.11 MIL/uL   Hemoglobin 10.3 (L) 12.0 - 15.0 g/dL   HCT 32.8 (L) 36.0 - 46.0 %   MCV 87.0 80.0 - 100.0 fL   MCH 27.3 26.0 - 34.0 pg   MCHC 31.4 30.0 - 36.0 g/dL   RDW 14.8 11.5 - 15.5 %   Platelets 235 150 - 400 K/uL   nRBC 0.0 0.0 - 0.2 %   Neutrophils Relative % 94 %   Neutro Abs 9.0 (H) 1.7 - 7.7 K/uL   Lymphocytes Relative 2 %   Lymphs Abs 0.2 (L) 0.7 - 4.0 K/uL   Monocytes Relative 3 %   Monocytes Absolute 0.3 0.1 - 1.0 K/uL   Eosinophils Relative 0 %   Eosinophils Absolute 0.0  0.0 - 0.5 K/uL   Basophils Relative 0 %   Basophils Absolute 0.0 0.0 - 0.1 K/uL   Immature Granulocytes 1 %   Abs Immature Granulocytes 0.11 (H) 0.00 - 0.07 K/uL  Comment: Performed at [facility], [place] [address], [place], [place] [address]  Protime-INR     Status: Abnormal   Collection Time: 01/13/21  7:41 PM  Result Value Ref Range   Prothrombin Time 15.5 (H) 11.4 - 15.2 seconds   INR 1.2 0.8 - 1.2    Comment: (NOTE) INR goal varies based on device and disease states. Performed at [facility], [place] [address], [place], [place] [address]   APTT     Status: None   Collection Time: 01/13/21  7:41 PM  Result Value Ref Range   aPTT 33 24 - 36 seconds    Comment: Performed at [facility], [place] [address], [place], [place] [address]   DG Chest Port 1 View  Result Date: 01/13/2021 CLINICAL DATA:  70 year old female with concern for sepsis. EXAM: PORTABLE CHEST 1 VIEW COMPARISON:  Chest radiograph dated 01/21/2013. chest CT report dated 11/04/2020. The images and non available for viewing. FINDINGS: There is rounded prominence of the right hilum which may represent adenopathy or developing opacity. A mass is less likely as none was reported on the recent chest CT of 11/04/2020. CT may provide better evaluation if clinically indicated. There is mild eventration of the right hemidiaphragm. Mild diffuse interstitial prominence may be chronic. Atypical infection is less likely. Clinical correlation is recommended. No pleural effusion, or pneumothorax. Mild cardiomegaly. no acute osseous pathology. IMPRESSION: 1. Rounded prominence of the right hilum may represent adenopathy or developing opacity. Clinical correlation is recommended. 2. Mild diffuse interstitial prominence may be chronic. Atypical infection is less likely. Electronically Signed   By: Anner Crete M.D.   On: 01/13/2021 19:44    Pending Labs Unresulted Labs (From  admission, onward)          Start     Ordered   01/13/21 1854  Resp Panel by RT-PCR (Flu A&B, Covid) Nasopharyngeal Swab  (Septic presentation on arrival (screening labs, nursing and treatment orders for obvious sepsis))  Once,   STAT       Question Answer Comment  Is this test for diagnosis or screening Screening   Symptomatic for COVID-19 as defined by CDC No   Hospitalized for COVID-19 No   Admitted to ICU for COVID-19 No   Previously tested for COVID-19 Yes   Resident in a congregate (group) care setting No   Employed in healthcare setting No   Pregnant No   Has patient completed COVID vaccination(s) (2 doses of Pfizer/Moderna 1 dose of [employer]) Unknown      01/13/21 1854   01/13/21 1854  Blood Culture (routine x 2)  (Septic presentation on arrival (screening labs, nursing and treatment orders for obvious sepsis))  BLOOD CULTURE X 2,   STAT      01/13/21 1854   01/13/21 1854  Urine culture  (Septic presentation on arrival (screening labs, nursing and treatment orders for obvious sepsis))  ONCE - STAT,   STAT        01/13/21 1854          Vitals/Pain Today's Vitals   01/13/21 2000 01/13/21 2030 01/13/21 2100 01/13/21 2130  BP: 118/68 116/65 125/69 115/69  Pulse: (!) 102 (!) 101 (!) 101 (!) 106  Resp: (!) 22 (!) 25 (!) 23 (!) 22  Temp:   99.9 F (37.7 C)   TempSrc:   Oral   SpO2: 100% 97% 94% 99%  PainSc:   6      Isolation Precautions  No active isolations  Medications Medications  sodium chloride 0.9 % bolus 1,000 mL (0 mLs Intravenous Stopped 01/13/21 2114)    And  sodium chloride 0.9 % bolus 1,000 mL (0 mLs Intravenous Stopped 01/13/21 2114)    And  sodium chloride 0.9 % bolus 1,000 mL (1,000 mLs Intravenous New Bag/Given 01/13/21 2114)  morphine 4 MG/ML injection 4 mg (4 mg Intravenous Given 01/13/21 2124)    Mobility Ambulatory with assistance

## 2021-01-13 NOTE — Chronic Care Management (AMB) (Signed)
Upon chart review noted that patient was in the hospital. Will attempt to do the assessment call upon discharge.   Maia Breslow, [place] [phone]

## 2021-01-13 NOTE — Brief Op Note (Signed)
01/13/2021  9:45 AM  PATIENT:  Reva Bores  70 y.o. female  PRE-OPERATIVE DIAGNOSIS:  RIGHT HYDRONEPHROSIS  POST-OPERATIVE DIAGNOSIS:  RIGHT HYDRONEPHROSIS  PROCEDURE:  Procedure(s) with comments: CYSTOSCOPY WITH RETROGRADE PYELOGRAM/URETERAL STENT REMOVALRIGHT (Right) - 45 MINS  SURGEON:  Surgeon(s) and Role:    * Alexis Frock, MD - Primary  PHYSICIAN ASSISTANT:   ASSISTANTS: none   ANESTHESIA:   general  EBL:  minimal   BLOOD ADMINISTERED:none  DRAINS: none   LOCAL MEDICATIONS USED:  NONE  SPECIMEN:  No Specimen  DISPOSITION OF SPECIMEN:  N/A  COUNTS:  YES  TOURNIQUET:  * No tourniquets in log *  DICTATION: .Other Dictation: Dictation Number [id number]  PLAN OF CARE: Discharge to home after PACU  PATIENT DISPOSITION:  PACU - hemodynamically stable.   Delay start of Pharmacological VTE agent (>24hrs) due to surgical blood loss or risk of bleeding: not applicable

## 2021-01-13 NOTE — H&P (Signed)
History and Physical    FAHMIDA MERGEL [id number] DOB: [birth year] DOA: 01/13/2021  PCP: [name], MD  Patient coming from: Home.  Chief Complaint: Fever and chills.  HPI: Ashley Moore is a 70 y.o. female with history of anal cancer being followed by Dr. Betsy Coder oncologist and history of obstructive uropathy with right-sided ureter being followed by Dr. Tresa Moore urologist was recently admitted in [facility] on January 07, 2021 with signs of sepsis from pyelonephritis.  Blood cultures grew Klebsiella pneumoniae and patient was placed on Invanz with PICC line to be taken for a week last day without being able 28 2022.  Patient's ureteral stent was to be removed and which was removed today by patient's urologist Dr. Tresa Moore after patient reached [place].  Following which patient went home and started having fever chills.  Patient states he has been having some nonproductive cough since her admission at the hospital last week.  Denies nausea vomiting or abdominal pain.  ED Course: In the ER patient was tachycardic with fever 102 F for chest x-ray was showing some opacity in the hilar area concerning for adenopathy versus infiltrate.  UA was consistent with UTI.  COVID test was negative.  Labs show creatinine hemoglobin of 10.3 creatinine 1.1.  Lactic acid was normal.  And ER physician discussed with on-call urologist Dr. McDiarmid who advised antibiotics.  Review of Systems: As per HPI, rest all negative.   Past Medical History:  Diagnosis Date  . Arthritis   . Bowen's disease    excised 1992  . Chronic low back pain   . Chronic radiation cystitis   . DDD (degenerative disc disease), cervical   . Family history of breast cancer   . Family history of lung cancer   . Family history of non-Hodgkin's lymphoma   . Family history of ovarian cancer   . Headache   . Hx of varicella   . Hydronephrosis of right kidney    urologist--- dr Tresa Moore,  malignant,  treated with  ureter stent  . Hypothyroidism    followed by pcp  . Lower urinary tract symptoms (LUTS)    01-12-2021  per pt treated with accupuncture at dr Tresa Moore office  . Lymphedema of both lower extremities   . PICC (peripherally inserted central catheter) in place 01/11/2021   placed at [facility] in [place], [place] for IV antibiotics  . Positive blood culture 01/08/2021   Klebsiella Pneumaniae,  treated with daily IV antibiotic  . 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 Sepsis due to Klebsiella pneumoniae [facility]) 01/07/2021   pt admitted to [facility] in [place],  dx sepsis secondary to acute pyelonephritis with bacteremia , positive blood culture,  discharged 01-11-2021 home daily IV antibiotic  . Wears glasses     Past Surgical History:  Procedure Laterality Date  . BUNIONECTOMY Right yrs ago  . CYSTOSCOPY W/ URETERAL STENT PLACEMENT Right 06/12/2020   Procedure: CYSTOSCOPY WITH RETROGRADE PYELOGRAM/URETERAL STENT EXCHANGE;  Surgeon: Alexis Frock, MD;  Location: [facility];  Service: Urology;  Laterality: Right;  . CYSTOSCOPY WITH RETROGRADE PYELOGRAM, URETEROSCOPY AND STENT PLACEMENT Right 01/28/2020   Procedure: CYSTOSCOPY WITH RETROGRADE PYELOGRAM, URETEROSCOPY AND STENT PLACEMENT;  Surgeon: Alexis Frock, MD;  Location: [facility];  Service: Urology;  Laterality: Right;  . IR GENERIC HISTORICAL  11/14/2016   IR US GUIDE VASC ACCESS RIGHT 11/14/2016 [facility]  . IR GENERIC HISTORICAL  11/14/2016   IR FLUORO GUIDE CV LINE RIGHT 11/14/2016 [facility]  . IR GENERIC HISTORICAL  12/12/2016   IR US GUIDE VASC ACCESS RIGHT 12/12/2016 [name], MD [facility]  . IR GENERIC HISTORICAL  12/12/2016   IR FLUORO GUIDE CV LINE RIGHT 12/12/2016 [name], MD [facility]  . SKIN CANCER EXCISION  1990   bowens disease     reports that she quit smoking about 25 years ago. Her smoking use included cigarettes. She has a 25.00 pack-year smoking history. She has never used smokeless tobacco. She reports previous alcohol use. She reports current drug use. Drug: Marijuana.  Allergies  Allergen Reactions  . Benadryl [Diphenhydramine] Other (See Comments)    Tingle all over   . Citrus   . Melatonin     Tingle all over   . Other Diarrhea    greenbeans and citrus  . Lemon Oil Diarrhea  . Penicillins Rash    Family History  Problem Relation Age of Onset  . Lung cancer Mother        lung  . Breast cancer Mother 50  . Ovarian cancer Mother 92  . Pancreatitis Father   . Breast cancer Sister 25  . Breast cancer Maternal Grandmother 14       breast   . Breast cancer Maternal Aunt 75       breast  . Melanoma Sister   . Breast cancer Sister 64  . Non-Hodgkin's lymphoma Paternal Grandmother     Prior to Admission medications   Medication Sig Start Date End Date Taking? Authorizing Provider  acetaminophen (TYLENOL) 500 MG tablet Take 1,000 mg by mouth daily.   Yes [provider]  B Complex-C CAPS Take 1 capsule by mouth daily.   Yes [provider]  calcium-vitamin D (OSCAL WITH D) 500-200 MG-UNIT tablet Take 1 tablet by mouth daily.    Yes [provider]  conjugated estrogens (PREMARIN) vaginal cream Place 1 Applicatorful vaginally daily.   Yes [provider]  ertapenem (INVANZ) 1 g injection Inject 1 g into the muscle daily. 01/12/21  Yes [provider]  levothyroxine (SYNTHROID) 50 MCG tablet TAKE 1 TABLET EVERY DAY Patient taking differently: Take 50 mcg by mouth daily before breakfast. 12/17/20  Yes Panosh, Standley Brooking, MD  meloxicam (MOBIC) 15 MG tablet TAKE 1 TABLET EVERY DAY (  APPOINTMENT IS NEEDED ) Patient taking differently: Take 15 mg by mouth daily. 09/22/20  Yes Persons, [name], PA  Methenamine-Sodium Salicylate (CYSTEX PO) Take 1 tablet by mouth daily.   Yes [provider]  mirabegron ER (MYRBETRIQ) 25 MG TB24 tablet Take 25 mg by mouth daily.  12/12/19  Yes [provider]  Multiple Vitamins-Minerals (MULTIVITAMIN ADULT) CHEW Chew 1 capsule by mouth daily.   Yes [provider]  OVER THE COUNTER MEDICATION Place 1 drop into both eyes daily. Soothing eye wash   Yes [provider]  Turmeric 500 MG CAPS Take 500 mg by mouth daily.   Yes [provider]  traMADol (ULTRAM) 50 MG tablet Take 1-2 tablets (50-100 mg total) by mouth every 6 (six) hours as needed for moderate pain (post-operatively). Patient not taking: No sig reported 01/13/21 01/13/22  Alexis Frock, MD    Physical  Exam: Constitutional: Moderately built and nourished. Vitals:   01/13/21 2100 01/13/21 2130 01/13/21 2200 01/13/21 2241  BP: 125/69 115/69 122/68 124/70  Pulse: (!) 101 (!) 106 100 100  Resp: (!) 23 (!) 22 20 20   Temp: 99.9 F (37.7 C)   (!) 100.7 F (38.2 C)  TempSrc: Oral   Oral  SpO2: 94% 99% 96% 95%   Eyes: Anicteric no pallor. ENMT: No discharge from the ears eyes nose and mouth. Neck: No mass felt.  No neck rigidity. Respiratory: No rhonchi or crepitations. Cardiovascular: S1-S2 heard. Abdomen: Soft nontender bowel sound present. Musculoskeletal: No edema. Skin: No rash. Neurologic: Alert awake oriented to time place and person.  Moves all extremities. Psychiatric: Appears normal.  Normal affect.   Labs on Admission: I have personally reviewed following labs and imaging studies  CBC: Recent Labs  Lab 01/13/21 0744 01/13/21 1941  WBC  --  9.7  NEUTROABS  --  9.0*  HGB 11.6* 10.3*  HCT 34.0* 32.8*  MCV  --  87.0  PLT  --  226   Basic Metabolic Panel: Recent Labs  Lab 01/13/21 0744 01/13/21 1941  NA 139 134*   K 3.5 3.7  CL 100 100  CO2  --  23  GLUCOSE 100* 119*  BUN 16 19  CREATININE 0.90 1.16*  CALCIUM  --  8.6*   GFR: Estimated Creatinine Clearance: 45.1 mL/min (A) (by C-G formula based on SCr of 1.16 mg/dL (H)). Liver Function Tests: Recent Labs  Lab 01/13/21 1941  AST 52*  ALT 40  ALKPHOS 121  BILITOT 0.3  PROT 7.3  ALBUMIN 3.2*   No results for input(s): LIPASE, AMYLASE in the last 168 hours. No results for input(s): AMMONIA in the last 168 hours. Coagulation Profile: Recent Labs  Lab 01/13/21 1941  INR 1.2   Cardiac Enzymes: No results for input(s): CKTOTAL, CKMB, CKMBINDEX, TROPONINI in the last 168 hours. BNP (last 3 results) No results for input(s): PROBNP in the last 8760 hours. HbA1C: No results for input(s): HGBA1C in the last 72 hours. CBG: No results for input(s): GLUCAP in the last 168 hours. Lipid Profile: No results for input(s): CHOL, HDL, LDLCALC, TRIG, CHOLHDL, LDLDIRECT in the last 72 hours. Thyroid Function Tests: No results for input(s): TSH, T4TOTAL, FREET4, T3FREE, THYROIDAB in the last 72 hours. Anemia Panel: No results for input(s): VITAMINB12, FOLATE, FERRITIN, TIBC, IRON, RETICCTPCT in the last 72 hours. Urine analysis:    Component Value Date/Time   COLORURINE YELLOW 01/13/2021 1938   APPEARANCEUR CLOUDY (A) 01/13/2021 1938   LABSPEC 1.026 01/13/2021 1938   LABSPEC 1.010 02/14/2017 1604   PHURINE 6.0 01/13/2021 1938   GLUCOSEU NEGATIVE 01/13/2021 1938   GLUCOSEU Negative 02/14/2017 1604   HGBUR LARGE (A) 01/13/2021 1938   BILIRUBINUR NEGATIVE 01/13/2021 1938   BILIRUBINUR Negative 01/08/2020 0949   BILIRUBINUR Negative 02/14/2017 1604   KETONESUR 5 (A) 01/13/2021 1938   PROTEINUR 100 (A) 01/13/2021 1938   UROBILINOGEN 0.2 01/08/2020 0949   UROBILINOGEN 0.2 02/14/2017 1604   NITRITE NEGATIVE 01/13/2021 1938   LEUKOCYTESUR LARGE (A) 01/13/2021 1938   LEUKOCYTESUR Small 02/14/2017 1604   Sepsis  Labs: @LABRCNTIP (procalcitonin:4,lacticidven:4) ) Recent Results (from the past 240 hour(s))  Resp Panel by RT-PCR (Flu A&B, Covid) Nasopharyngeal Swab     Status: None   Collection Time: 01/13/21  6:06 AM   Specimen: Nasopharyngeal Swab; Nasopharyngeal(NP) swabs in vial transport medium  Result Value Ref Range Status   SARS Coronavirus 2 by RT  PCR NEGATIVE NEGATIVE Final    Comment: (NOTE) SARS-CoV-2 target nucleic acids are NOT DETECTED.  The SARS-CoV-2 RNA is generally detectable in upper respiratory specimens during the acute phase of infection. The lowest concentration of SARS-CoV-2 viral copies this assay can detect is 138 copies/mL. A negative result does not preclude SARS-Cov-2 infection and should not be used as the sole basis for treatment or other patient management decisions. A negative result may occur with  improper specimen collection/handling, submission of specimen other than nasopharyngeal swab, presence of viral mutation(s) within the areas targeted by this assay, and inadequate number of viral copies(<138 copies/mL). A negative result must be combined with clinical observations, patient history, and epidemiological information. The expected result is Negative.  Fact Sheet for Patients:  EntrepreneurPulse.com.au  Fact Sheet for Healthcare Providers:  IncredibleEmployment.be  This test is no t yet approved or cleared by the [place] FDA and  has been authorized for detection and/or diagnosis of SARS-CoV-2 by FDA under an Emergency Use Authorization (EUA). This EUA will remain  in effect (meaning this test can be used) for the duration of the COVID-19 declaration under Section 564(b)(1) of the Act, 21 U.S.C.section 360bbb-3(b)(1), unless the authorization is terminated  or revoked sooner.       Influenza A by PCR NEGATIVE NEGATIVE Final   Influenza B by PCR NEGATIVE NEGATIVE Final    Comment: (NOTE) The Xpert Xpress  SARS-CoV-2/FLU/RSV plus assay is intended as an aid in the diagnosis of influenza from Nasopharyngeal swab specimens and should not be used as a sole basis for treatment. Nasal washings and aspirates are unacceptable for Xpert Xpress SARS-CoV-2/FLU/RSV testing.  Fact Sheet for Patients: EntrepreneurPulse.com.au  Fact Sheet for Healthcare Providers: IncredibleEmployment.be  This test is not yet approved or cleared by the [place] FDA and has been authorized for detection and/or diagnosis of SARS-CoV-2 by FDA under an Emergency Use Authorization (EUA). This EUA will remain in effect (meaning this test can be used) for the duration of the COVID-19 declaration under Section 564(b)(1) of the Act, 21 U.S.C. section 360bbb-3(b)(1), unless the authorization is terminated or revoked.  Performed at [facility], [place] [address], [place], [place] [address]   Resp Panel by RT-PCR (Flu A&B, Covid) Nasopharyngeal Swab     Status: None   Collection Time: 01/13/21  7:41 PM   Specimen: Nasopharyngeal Swab; Nasopharyngeal(NP) swabs in vial transport medium  Result Value Ref Range Status   SARS Coronavirus 2 by RT PCR NEGATIVE NEGATIVE Final    Comment: (NOTE) SARS-CoV-2 target nucleic acids are NOT DETECTED.  The SARS-CoV-2 RNA is generally detectable in upper respiratory specimens during the acute phase of infection. The lowest concentration of SARS-CoV-2 viral copies this assay can detect is 138 copies/mL. A negative result does not preclude SARS-Cov-2 infection and should not be used as the sole basis for treatment or other patient management decisions. A negative result may occur with  improper specimen collection/handling, submission of specimen other than nasopharyngeal swab, presence of viral mutation(s) within the areas targeted by this assay, and inadequate number of viral copies(<138 copies/mL). A negative result must be  combined with clinical observations, patient history, and epidemiological information. The expected result is Negative.  Fact Sheet for Patients:  EntrepreneurPulse.com.au  Fact Sheet for Healthcare Providers:  IncredibleEmployment.be  This test is no t yet approved or cleared by the [place] FDA and  has been authorized for detection and/or diagnosis of SARS-CoV-2 by FDA under an Emergency Use Authorization (  EUA). This EUA will remain  in effect (meaning this test can be used) for the duration of the COVID-19 declaration under Section 564(b)(1) of the Act, 21 U.S.C.section 360bbb-3(b)(1), unless the authorization is terminated  or revoked sooner.       Influenza A by PCR NEGATIVE NEGATIVE Final   Influenza B by PCR NEGATIVE NEGATIVE Final    Comment: (NOTE) The Xpert Xpress SARS-CoV-2/FLU/RSV plus assay is intended as an aid in the diagnosis of influenza from Nasopharyngeal swab specimens and should not be used as a sole basis for treatment. Nasal washings and aspirates are unacceptable for Xpert Xpress SARS-CoV-2/FLU/RSV testing.  Fact Sheet for Patients: EntrepreneurPulse.com.au  Fact Sheet for Healthcare Providers: IncredibleEmployment.be  This test is not yet approved or cleared by the [place] FDA and has been authorized for detection and/or diagnosis of SARS-CoV-2 by FDA under an Emergency Use Authorization (EUA). This EUA will remain in effect (meaning this test can be used) for the duration of the COVID-19 declaration under Section 564(b)(1) of the Act, 21 U.S.C. section 360bbb-3(b)(1), unless the authorization is terminated or revoked.  Performed at [facility], [place] [address] [place], [place] [address]      Radiological Exams on Admission: DG Chest Port 1 View  Result Date: 01/13/2021 CLINICAL DATA:  70 year old female with concern for sepsis. EXAM:  PORTABLE CHEST 1 VIEW COMPARISON:  Chest radiograph dated 01/21/2013. chest CT report dated 11/04/2020. The images and non available for viewing. FINDINGS: There is rounded prominence of the right hilum which may represent adenopathy or developing opacity. A mass is less likely as none was reported on the recent chest CT of 11/04/2020. CT may provide better evaluation if clinically indicated. There is mild eventration of the right hemidiaphragm. Mild diffuse interstitial prominence may be chronic. Atypical infection is less likely. Clinical correlation is recommended. No pleural effusion, or pneumothorax. Mild cardiomegaly. no acute osseous pathology. IMPRESSION: 1. Rounded prominence of the right hilum may represent adenopathy or developing opacity. Clinical correlation is recommended. 2. Mild diffuse interstitial prominence may be chronic. Atypical infection is less likely. Electronically Signed   By: Anner Crete M.D.   On: 01/13/2021 19:44    EKG: Independently reviewed.  Sinus tachycardia.  Assessment/Plan Principal Problem:   SIRS (systemic inflammatory response syndrome) (HCC) Active Problems:   Anal cancer (HCC)   Pyelonephritis    1. SIRS with possible delving sepsis likely source could be UTI.  However since patient's chest x-ray does show density versus infiltrates we will keep patient on empiric antibiotics.  Patient is already on Invanz which we will continue and vancomycin follow cultures continue hydration. 2. History of right-sided obstructive uropathy was on stent which was just removed today.  We will continue to monitor.  Follow creatinine closely.  Denies any abdominal pain at this time. 3. Anemia -appears to be having progressively worsening hemoglobin.  We will closely monitor CBC. 4. History of anal cancer being followed by Dr. Betsy Coder. 5. Hypothyroidism on Synthroid. 6. Patient has a PICC line in the right upper extremity from recent admission at a [facility] for  sepsis.  At the time blood cultures grew Klebsiella pneumonia.  Likely source from pyelonephritis.   DVT prophylaxis: Lovenox. Code Status: Full code. Family Communication: Discussed with patient. Disposition Plan: Home. Consults called: ER physician discussed with on-call urologist. Admission status: Observation.   Rise Patience MD [facility] Pager [phone].  If 7PM-7AM, please contact night-coverage [email or web address] Password [facility]  01/13/2021,  11:45 PM

## 2021-01-13 NOTE — Transfer of Care (Signed)
Immediate Anesthesia Transfer of Care Note  Patient: Ashley Moore  Procedure(s) Performed: Procedure(s) (LRB): CYSTOSCOPY WITH RETROGRADE PYELOGRAM/URETERAL STENT REMOVALRIGHT (Right)  Patient Location: PACU  Anesthesia Type: General  Level of Consciousness: awake, sedated, patient cooperative and responds to stimulation  Airway & Oxygen Therapy: Patient Spontanous Breathing and Patient connected to [place] 02 and soft FM   Post-op Assessment: Report given to PACU RN, Post -op Vital signs reviewed and stable and Patient moving all extremities  Post vital signs: Reviewed and stable  Complications: No apparent anesthesia complications

## 2021-01-13 NOTE — Anesthesia Postprocedure Evaluation (Signed)
Anesthesia Post Note  Patient: Ashley Moore  Procedure(s) Performed: CYSTOSCOPY WITH RETROGRADE PYELOGRAM/URETERAL STENT REMOVALRIGHT (Right Ureter)     Patient location during evaluation: PACU Anesthesia Type: General Level of consciousness: awake and alert Pain management: pain level controlled Vital Signs Assessment: post-procedure vital signs reviewed and stable Respiratory status: spontaneous breathing, nonlabored ventilation, respiratory function stable and patient connected to nasal cannula oxygen Cardiovascular status: blood pressure returned to baseline and stable Postop Assessment: no apparent nausea or vomiting Anesthetic complications: no   No complications documented.  Last Vitals:  Vitals:   01/13/21 1039 01/13/21 1040  BP:    Pulse: 79 75  Resp: 20 19  Temp:  37.1 C  SpO2: 100% 100%    Last Pain:  Vitals:   01/13/21 1040  TempSrc: Oral  PainSc:                  Jenaro Souder S

## 2021-01-13 NOTE — Anesthesia Preprocedure Evaluation (Signed)
Anesthesia Evaluation  Patient identified by MRN, date of birth, ID band Patient awake    Reviewed: Allergy & Precautions, NPO status , Patient's Chart, lab work & pertinent test results  Airway Mallampati: II  TM Distance: >3 FB Neck ROM: Full    Dental no notable dental hx.    Pulmonary pneumonia, former smoker,    Pulmonary exam normal breath sounds clear to auscultation       Cardiovascular negative cardio ROS Normal cardiovascular exam Rhythm:Regular Rate:Normal     Neuro/Psych negative neurological ROS  negative psych ROS   GI/Hepatic negative GI ROS, Neg liver ROS,   Endo/Other  Hypothyroidism   Renal/GU Renal diseaseUrosepsis 01/08/21  negative genitourinary   Musculoskeletal negative musculoskeletal ROS (+)   Abdominal   Peds negative pediatric ROS (+)  Hematology negative hematology ROS (+)   Anesthesia Other Findings   Reproductive/Obstetrics negative OB ROS                             Anesthesia Physical Anesthesia Plan  ASA: III  Anesthesia Plan: General   Post-op Pain Management:    Induction: Intravenous  PONV Risk Score and Plan: 3 and Ondansetron, Dexamethasone and Treatment may vary due to age or medical condition  Airway Management Planned: LMA  Additional Equipment:   Intra-op Plan:   Post-operative Plan: Extubation in OR  Informed Consent: I have reviewed the patients History and Physical, chart, labs and discussed the procedure including the risks, benefits and alternatives for the proposed anesthesia with the patient or authorized representative who has indicated his/her understanding and acceptance.     Dental advisory given  Plan Discussed with: CRNA and Surgeon  Anesthesia Plan Comments:         Anesthesia Quick Evaluation

## 2021-01-14 ENCOUNTER — Observation Stay (HOSPITAL_COMMUNITY): Payer: Medicare HMO

## 2021-01-14 DIAGNOSIS — N390 Urinary tract infection, site not specified: Secondary | ICD-10-CM | POA: Diagnosis not present

## 2021-01-14 DIAGNOSIS — R109 Unspecified abdominal pain: Secondary | ICD-10-CM | POA: Diagnosis not present

## 2021-01-14 DIAGNOSIS — R7881 Bacteremia: Secondary | ICD-10-CM | POA: Diagnosis not present

## 2021-01-14 DIAGNOSIS — E876 Hypokalemia: Secondary | ICD-10-CM

## 2021-01-14 DIAGNOSIS — N139 Obstructive and reflux uropathy, unspecified: Secondary | ICD-10-CM

## 2021-01-14 DIAGNOSIS — N179 Acute kidney failure, unspecified: Secondary | ICD-10-CM

## 2021-01-14 DIAGNOSIS — A419 Sepsis, unspecified organism: Secondary | ICD-10-CM | POA: Diagnosis not present

## 2021-01-14 DIAGNOSIS — N1330 Unspecified hydronephrosis: Secondary | ICD-10-CM | POA: Diagnosis not present

## 2021-01-14 DIAGNOSIS — E871 Hypo-osmolality and hyponatremia: Secondary | ICD-10-CM | POA: Diagnosis not present

## 2021-01-14 DIAGNOSIS — N12 Tubulo-interstitial nephritis, not specified as acute or chronic: Secondary | ICD-10-CM | POA: Diagnosis not present

## 2021-01-14 LAB — BASIC METABOLIC PANEL
Anion gap: 8 (ref 5–15)
BUN: 19 mg/dL (ref 8–23)
CO2: 20 mmol/L — ABNORMAL LOW (ref 22–32)
Calcium: 7.6 mg/dL — ABNORMAL LOW (ref 8.9–10.3)
Chloride: 106 mmol/L (ref 98–111)
Creatinine, Ser: 1.11 mg/dL — ABNORMAL HIGH (ref 0.44–1.00)
GFR, Estimated: 53 mL/min — ABNORMAL LOW (ref >60)
Glucose, Bld: 116 mg/dL — ABNORMAL HIGH (ref 70–99)
Potassium: 3.4 mmol/L — ABNORMAL LOW (ref 3.5–5.1)
Sodium: 134 mmol/L — ABNORMAL LOW (ref 135–145)

## 2021-01-14 LAB — CBC
HCT: 27.4 % — ABNORMAL LOW (ref 36.0–46.0)
Hemoglobin: 8.5 g/dL — ABNORMAL LOW (ref 12.0–15.0)
MCH: 27.8 pg (ref 26.0–34.0)
MCHC: 31 g/dL (ref 30.0–36.0)
MCV: 89.5 fL (ref 80.0–100.0)
Platelets: 154 10*3/uL (ref 150–400)
RBC: 3.06 MIL/uL — ABNORMAL LOW (ref 3.87–5.11)
RDW: 15.1 % (ref 11.5–15.5)
WBC: 6.4 10*3/uL (ref 4.0–10.5)
nRBC: 0 % (ref 0.0–0.2)

## 2021-01-14 LAB — MRSA PCR SCREENING: MRSA by PCR: NEGATIVE

## 2021-01-14 MED ORDER — VANCOMYCIN HCL 750 MG/150ML IV SOLN: 750.0000 mg | INTRAVENOUS | Status: DC

## 2021-01-14 MED ORDER — VANCOMYCIN HCL 1750 MG/350ML IV SOLN
1750.0000 mg | Freq: Once | INTRAVENOUS | Status: AC
  Administered 2021-01-14: 1750 mg via INTRAVENOUS
  Filled 2021-01-14: qty 350

## 2021-01-14 MED ORDER — SODIUM CHLORIDE 0.9% FLUSH
10.0000 mL | Freq: Two times a day (BID) | INTRAVENOUS | Status: DC
  Administered 2021-01-15 – 2021-01-18 (×7): 10 mL

## 2021-01-14 MED ORDER — TRAMADOL HCL 50 MG PO TABS
50.0000 mg | ORAL_TABLET | Freq: Four times a day (QID) | ORAL | Status: DC | PRN
  Administered 2021-01-14: 50 mg via ORAL
  Filled 2021-01-14: qty 1

## 2021-01-14 MED ORDER — POTASSIUM CHLORIDE CRYS ER 20 MEQ PO TBCR
20.0000 meq | EXTENDED_RELEASE_TABLET | Freq: Once | ORAL | Status: AC
  Administered 2021-01-14: 20 meq via ORAL
  Filled 2021-01-14: qty 1

## 2021-01-14 MED ORDER — SODIUM CHLORIDE 0.9% FLUSH: 10.0000 mL | INTRAVENOUS | Status: DC | PRN

## 2021-01-14 NOTE — Plan of Care (Signed)

## 2021-01-14 NOTE — Progress Notes (Signed)
Pharmacy Antibiotic Note  Ashley Moore is a 70 y.o. female admitted on 01/13/2021 with fever and chills and underwent ureteral stent removal on 4/27.  Patient recently hospitalized for pyelonephritis with MDR klebsiella.  PMH significant for rectal cancer.  Pharmacy has been consulted for Vancomycin dosing.  Plan: - Vancomycin 1750mg  IV x 1 loading dose followed by 750 mg IV Q 24 hrs. Goal AUC 400-550.  Expected AUC: 503  SCr used: 1.1  Vd = 0.5 - Invanz per MD - Follow renal function, culture results & sensitivities     Temp (24hrs), Avg:99.5 F (37.5 C), Min:98.1 F (36.7 C), Max:102.8 F (39.3 C)  Recent Labs  Lab 01/13/21 0744 01/13/21 1941 01/14/21 0415  WBC  --  9.7 6.4  CREATININE 0.90 1.16* 1.11*  LATICACIDVEN  --  1.6  --     Estimated Creatinine Clearance: 47.1 mL/min (A) (by C-G formula based on SCr of 1.11 mg/dL (H)).    Allergies  Allergen Reactions  . Benadryl [Diphenhydramine] Other (See Comments)    Tingle all over   . Citrus   . Melatonin     Tingle all over   . Other Diarrhea    greenbeans and citrus  . Lemon Oil Diarrhea  . Penicillins Rash    Antimicrobials this admission: 4/27 Gent x 1 4/28 Invanz (continued on admission) >>   4/28 Vanc >>    Dose adjustments this admission:    Microbiology results: 4/27 BCx:   4/27 UCx:     Thank you for allowing pharmacy to be a part of this patient's care.  Everette Rank, PharmD 01/14/2021 6:54 AM

## 2021-01-14 NOTE — Op Note (Signed)
Ashley Moore, Ashley Moore MEDICAL RECORD NO: [id number] ACCOUNT NO: [id number] DATE OF BIRTH: 1951-03-18 FACILITY: [facility] LOCATION: [facility] PHYSICIAN: Alexis Frock, MD  Operative Report   DATE OF PROCEDURE: 01/13/2021  PREOPERATIVE DIAGNOSIS:  Malignant right hydronephrosis.  PROCEDURE PERFORMED:  1.  Cystoscopy with right retrograde pyelogram, interpretation. 2.  Removal of right ureteral stent.  ESTIMATED BLOOD LOSS: Nil.  COMPLICATIONS:  None.  SPECIMEN:  Right ureteral stent inspected, intact for discard.  FINDINGS:  1.  Significant resolution of prior right hydronephrosis. 2.  Decision to remove stent instead of exchange. 3.  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  INDICATIONS:  The patient is a 70 year old woman with history of locally advanced rectal cancer.  She is status post radiation and chemotherapy for this.  Initially, she was noted to have right hydronephrosis, malignant in origin and she was managed  during a portion of her local and systemic therapy with right ureteral stenting to maintain renal function.  She on most recent surveillance imaging has had significant reduction in the volume of her retroperitoneal and pelvic adenopathy and bulky  disease and does have significant irritation, likely due to radiation changes but possibly aggravated by her stent.  Options were discussed for management including recommended path of operative cystoscopy and retrograde and removal versus exchange of  stent, pending intraoperative findings.  She wished to proceed.  She does have a history of recent GU infections.  She is on culture-specific antibiotics for this.  She has been afebrile for at least 24 hours.  PROCEDURE IN DETAIL:  The patient being verified, procedure being cystoscopy with right retrograde and stent exchange versus removal was confirmed.  Procedure timeout was performed.  Intravenous antibiotics were administered.  General LMA anesthesia  induced.   The patient placed into a low lithotomy position.  Sterile field was created.  Prepped and draped the patient's vagina, introitus and proximal thigh using iodine.  She does have significant radiation changes to her perineum.  Cystourethroscopy  was performed with 21-French rigid cystoscope with offset lens.  She does have some fixation of the pelvis due to radiation and some diffuse erythema of the bladder consistent with radiation changes.  No papillary lesions or ulcers noted.  Distal end of  the right stent was seen in the expected position.  It was grasped and it was then brought out in its entirety, inspected for discard and the right ureteral orifice was cannulated with a 6-French renal catheter and right retrograde pyelogram was  obtained.  Right retrograde pyelogram demonstrated single right ureter, single system right kidney.  There was minimal caliectasis without frank hydronephrosis. With withdrawing the open-ended catheter, there was still noted to be antegrade efflux of contrast.  Given the favorable findings on retrograde and her most recent CT, it was felt that a trial without stent would be most advantageous.  Decision was made not to exchange this today.  As such, the bladder was emptied per cystoscope.  Procedure was then  terminated.  The patient tolerated the procedure well, no immediate complications.  The patient was taken to postanesthesia care unit in stable condition.  Plan for discharge home.   SHW D: 01/13/2021 9:50:34 am T: 01/14/2021 3:31:00 am  JOB: [id number]/ [id number]

## 2021-01-14 NOTE — Consult Note (Signed)
. Urology Consult   Physician requesting consult: Wendee Beavers  Reason for consult: Fever and recent pyelonephritis  History of Present Illness: Ashley Moore is a 70 y.o. female with PMH significant for right hydro/pyelonephritis, rectal cancer, hypothyroidism, and chronic radiation cystitis who was admitted last night for eval and treatment of fever, headache, and cough.  She was hospitalized in [place] 4/21-4/27/22 for K. Pneumonia bacteremia, pyelonephritis, and obstructive uropathy with right hydro for which she had a stent placed and was discharged on IV Invanz via PICC line. This was to be continued through 01/14/21. She is s/p cysto with right ureteral stent removal and retrograde pyelogram by Dr. Tresa Moore yesterday.  She tolerated the procedure well and was discharged home.  Several hours after d/c home she began to note a terrible headache and fever with chills. Her sister told her to call 911 and she was brought by ambulance to the ED at [facility].    In the ED she was found to be febrile, tachycardic, and tachypnic.  Cr 1.16, WBC 9.7. CXR with some hilar opacity. UA positive for leukocytes and RBCs nitrite negative. Blood and urine cultures pending. She received IV Vanc which was subsequently switched back to Invanz.   She is currently resting comfortably and states she feels better.  She denies F/C, HA, CP, SOB, N/V, diarrhea/constipation, and abdominal pain.  She is voiding without difficulty.    Past Medical History:  Diagnosis Date  . Arthritis   . Bowen's disease    excised 1992  . Chronic low back pain   . Chronic radiation cystitis   . DDD (degenerative disc disease), cervical   . Family history of breast cancer   . Family history of lung cancer   . Family history of non-Hodgkin's lymphoma   . Family history of ovarian cancer   . Headache   . Hx of varicella   . Hydronephrosis of right kidney    urologist--- dr Tresa Moore,  malignant,  treated with ureter stent  . Hypothyroidism     followed by pcp  . Lower urinary tract symptoms (LUTS)    01-12-2021  per pt treated with accupuncture at dr Tresa Moore office  . Lymphedema of both lower extremities   . PICC (peripherally inserted central catheter) in place 01/11/2021   placed at [facility] in [place], [place] for IV antibiotics  . Positive blood culture 01/08/2021   Klebsiella Pneumaniae,  treated with daily IV antibiotic  . 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[facility] oncologist--- dr Benay Spice   dx 02/ 2018,  invasive SCC , completed chemo/ radiation 12-23-2016;  recurrent metastatsis retroperitoneal lymphdenopathy,  completed radiation 04-13-2020 residual right ureteral uropathy obstruction  . 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 Sepsis due to Klebsiella pneumoniae [facility]) 01/07/2021   pt admitted to [facility] in [place],  dx sepsis secondary to acute pyelonephritis with bacteremia , positive blood culture,  discharged 01-11-2021 home daily IV antibiotic  . Wears glasses     Past Surgical History:  Procedure Laterality Date  . BUNIONECTOMY Right yrs ago  . CYSTOSCOPY W/ URETERAL STENT PLACEMENT Right 06/12/2020   Procedure: CYSTOSCOPY WITH RETROGRADE PYELOGRAM/URETERAL STENT EXCHANGE;  Surgeon: Alexis Frock, MD;  Location: [facility];  Service: Urology;  Laterality: Right;  . CYSTOSCOPY WITH RETROGRADE PYELOGRAM, URETEROSCOPY AND STENT PLACEMENT Right 01/28/2020   Procedure: CYSTOSCOPY WITH RETROGRADE PYELOGRAM, URETEROSCOPY AND STENT PLACEMENT;  Surgeon: Alexis Frock, MD;  Location:  [facility];  Service: Urology;  Laterality: Right;  . IR GENERIC HISTORICAL  11/14/2016   IR US GUIDE VASC ACCESS RIGHT 11/14/2016 [facility]  . IR GENERIC HISTORICAL  11/14/2016   IR FLUORO GUIDE CV LINE RIGHT 11/14/2016 [facility]  . IR GENERIC HISTORICAL  12/12/2016   IR US GUIDE VASC ACCESS RIGHT 12/12/2016 [name], MD [facility]  . IR GENERIC  HISTORICAL  12/12/2016   IR FLUORO GUIDE CV LINE RIGHT 12/12/2016 [name], MD [facility]  . SKIN CANCER EXCISION  1990   bowens disease     Current Hospital Medications:  Home meds:  . Current Meds  Medication Sig  . acetaminophen (TYLENOL) 500 MG tablet Take 1,000 mg by mouth daily.  . B Complex-C CAPS Take 1 capsule by mouth daily.  . calcium-vitamin D (OSCAL WITH D) 500-200 MG-UNIT tablet Take 1 tablet by mouth daily.   Marland Kitchen conjugated estrogens (PREMARIN) vaginal cream Place 1 Applicatorful vaginally daily.  . ertapenem (INVANZ) 1 g injection Inject 1 g into the muscle daily.  Marland Kitchen levothyroxine (SYNTHROID) 50 MCG tablet TAKE 1 TABLET EVERY DAY (Patient taking differently: Take 50 mcg by mouth daily before breakfast.)  . meloxicam (MOBIC) 15 MG tablet TAKE 1 TABLET EVERY DAY ( APPOINTMENT IS NEEDED ) (Patient taking differently: Take 15 mg by mouth daily.)  . Methenamine-Sodium Salicylate (CYSTEX PO) Take 1 tablet by mouth daily.  . mirabegron ER (MYRBETRIQ) 25 MG TB24 tablet Take 25 mg by mouth daily.   . Multiple Vitamins-Minerals (MULTIVITAMIN ADULT) CHEW Chew 1 capsule by mouth daily.  Marland Kitchen OVER THE COUNTER MEDICATION Place 1 drop into both eyes daily. Soothing eye wash  . Turmeric 500 MG CAPS Take 500 mg by mouth daily.     Scheduled Meds: . enoxaparin (LOVENOX) injection  40 mg Subcutaneous Q24H  . feeding supplement  237 mL Oral BID BM  . levothyroxine  50 mcg Oral QAC breakfast  . sodium chloride flush  10-40 mL Intracatheter Q12H   Continuous Infusions: . sodium chloride 125 mL/hr at 01/14/21 0832  . ertapenem     PRN Meds:.acetaminophen, sodium chloride flush  Allergies:  Allergies  Allergen Reactions  . Benadryl [Diphenhydramine] Other (See Comments)    Tingle all over   . Citrus   . Melatonin     Tingle all over   . Other Diarrhea    greenbeans and citrus  . Lemon Oil Diarrhea  . Penicillins Rash    Family History  Problem Relation Age of Onset  .  Lung cancer Mother        lung  . Breast cancer Mother 22  . Ovarian cancer Mother 14  . Pancreatitis Father   . Breast cancer Sister 69  . Breast cancer Maternal Grandmother 60       breast   . Breast cancer Maternal Aunt 75       breast  . Melanoma Sister   . Breast cancer Sister 28  . Non-Hodgkin's lymphoma Paternal Grandmother     Social History:  reports that she quit smoking about 25 years ago. Her smoking use included cigarettes. She has a 25.00 pack-year smoking history. She has never used smokeless tobacco. She reports previous alcohol use. She reports current drug use. Drug: Marijuana.  ROS: A complete review of systems was performed.  All systems are negative except for pertinent findings as noted.  Physical Exam:  Vital signs in last 24 hours: Temp:  [98.1 F (36.7 C)-102.8 F (  39.3 C)] 98.6 F (37 C) (04/28 1316) Pulse Rate:  [84-108] 89 (04/28 1316) Resp:  [14-27] 14 (04/28 1316) BP: (101-141)/(55-79) 111/67 (04/28 1316) SpO2:  [93 %-100 %] 97 % (04/28 1316) Constitutional:  Alert and oriented, No acute distress Cardiovascular: Regular rate and rhythm Respiratory: Normal respiratory effort GI: Abdomen is soft, nontender, nondistended GU: No CVA tenderness Lymphatic: No lymphadenopathy Neurologic: Grossly intact, no focal deficits Psychiatric: Normal mood and affect  Laboratory Data:  Recent Labs    01/13/21 0744 01/13/21 1941 01/14/21 0415  WBC  --  9.7 6.4  HGB 11.6* 10.3* 8.5*  HCT 34.0* 32.8* 27.4*  PLT  --  235 154    Recent Labs    01/13/21 0744 01/13/21 1941 01/14/21 0415  NA 139 134* 134*  K 3.5 3.7 3.4*  CL 100 100 106  GLUCOSE 100* 119* 116*  BUN 16 19 19   CALCIUM  --  8.6* 7.6*  CREATININE 0.90 1.16* 1.11*     Results for orders placed or performed during the hospital encounter of 01/13/21 (from the past 24 hour(s))  Blood Culture (routine x 2)     Status: None (Preliminary result)   Collection Time: 01/13/21  7:20 PM    Specimen: BLOOD  Result Value Ref Range   Specimen Description      BLOOD LEFT ANTECUBITAL Performed at [facility], [place] [address] [place], [place] [address]    Special Requests      BOTTLES DRAWN AEROBIC AND ANAEROBIC Blood Culture adequate volume Performed at [facility] [address], [place], [place] [address]    Culture      NO GROWTH < 12 HOURS Performed at [facility] [address], [place], [place] [address]    Report Status PENDING   Blood Culture (routine x 2)     Status: None (Preliminary result)   Collection Time: 01/13/21  7:31 PM   Specimen: BLOOD  Result Value Ref Range   Specimen Description      BLOOD RIGHT PICC LINE Performed at [facility], [place] [address], [place], [place] [address]    Special Requests      BOTTLES DRAWN AEROBIC AND ANAEROBIC Blood Culture adequate volume Performed at [facility] [address], [place], [place] [address]    Culture      NO GROWTH < 12 HOURS Performed at [facility] [address], [place],  [address]    Report Status PENDING   Urinalysis, Routine w reflex microscopic     Status: Abnormal   Collection Time: 01/13/21  7:38 PM  Result Value Ref Range   Color, Urine YELLOW YELLOW   APPearance CLOUDY (A) CLEAR   Specific Gravity, Urine 1.026 1.005 - 1.030   pH 6.0 5.0 - 8.0   Glucose, UA NEGATIVE NEGATIVE mg/dL   Hgb urine dipstick LARGE (A) NEGATIVE   Bilirubin Urine NEGATIVE NEGATIVE   Ketones, ur 5 (A) NEGATIVE mg/dL   Protein, ur 100 (A) NEGATIVE mg/dL   Nitrite NEGATIVE NEGATIVE   Leukocytes,Ua LARGE (A) NEGATIVE   RBC / HPF >50 (H) 0 - 5 RBC/hpf   WBC, UA >50 (H) 0 - 5 WBC/hpf   Bacteria, UA RARE (A) NONE SEEN   Squamous Epithelial / LPF 0-5 0 - 5   WBC Clumps PRESENT    Mucus PRESENT   Resp Panel by RT-PCR (Flu A&B, Covid) Nasopharyngeal Swab     Status: None  Collection Time: 01/13/21  7:41 PM    Specimen: Nasopharyngeal Swab; Nasopharyngeal(NP) swabs in vial transport medium  Result Value Ref Range   SARS Coronavirus 2 by RT PCR NEGATIVE NEGATIVE   Influenza A by PCR NEGATIVE NEGATIVE   Influenza B by PCR NEGATIVE NEGATIVE  Lactic acid, plasma     Status: None   Collection Time: 01/13/21  7:41 PM  Result Value Ref Range   Lactic Acid, Venous 1.6 0.5 - 1.9 mmol/L  Comprehensive metabolic panel     Status: Abnormal   Collection Time: 01/13/21  7:41 PM  Result Value Ref Range   Sodium 134 (L) 135 - 145 mmol/L   Potassium 3.7 3.5 - 5.1 mmol/L   Chloride 100 98 - 111 mmol/L   CO2 23 22 - 32 mmol/L   Glucose, Bld 119 (H) 70 - 99 mg/dL   BUN 19 8 - 23 mg/dL   Creatinine, Ser 1.16 (H) 0.44 - 1.00 mg/dL   Calcium 8.6 (L) 8.9 - 10.3 mg/dL   Total Protein 7.3 6.5 - 8.1 g/dL   Albumin 3.2 (L) 3.5 - 5.0 g/dL   AST 52 (H) 15 - 41 U/L   ALT 40 0 - 44 U/L   Alkaline Phosphatase 121 38 - 126 U/L   Total Bilirubin 0.3 0.3 - 1.2 mg/dL   GFR, Estimated 50 (L) >60 mL/min   Anion gap 11 5 - 15  CBC WITH DIFFERENTIAL     Status: Abnormal   Collection Time: 01/13/21  7:41 PM  Result Value Ref Range   WBC 9.7 4.0 - 10.5 K/uL   RBC 3.77 (L) 3.87 - 5.11 MIL/uL   Hemoglobin 10.3 (L) 12.0 - 15.0 g/dL   HCT 32.8 (L) 36.0 - 46.0 %   MCV 87.0 80.0 - 100.0 fL   MCH 27.3 26.0 - 34.0 pg   MCHC 31.4 30.0 - 36.0 g/dL   RDW 14.8 11.5 - 15.5 %   Platelets 235 150 - 400 K/uL   nRBC 0.0 0.0 - 0.2 %   Neutrophils Relative % 94 %   Neutro Abs 9.0 (H) 1.7 - 7.7 K/uL   Lymphocytes Relative 2 %   Lymphs Abs 0.2 (L) 0.7 - 4.0 K/uL   Monocytes Relative 3 %   Monocytes Absolute 0.3 0.1 - 1.0 K/uL   Eosinophils Relative 0 %   Eosinophils Absolute 0.0 0.0 - 0.5 K/uL   Basophils Relative 0 %   Basophils Absolute 0.0 0.0 - 0.1 K/uL   Immature Granulocytes 1 %   Abs Immature Granulocytes 0.11 (H) 0.00 - 0.07 K/uL  Protime-INR     Status: Abnormal   Collection Time: 01/13/21  7:41 PM  Result Value Ref Range    Prothrombin Time 15.5 (H) 11.4 - 15.2 seconds   INR 1.2 0.8 - 1.2  APTT     Status: None   Collection Time: 01/13/21  7:41 PM  Result Value Ref Range   aPTT 33 24 - 36 seconds  Basic metabolic panel     Status: Abnormal   Collection Time: 01/14/21  4:15 AM  Result Value Ref Range   Sodium 134 (L) 135 - 145 mmol/L   Potassium 3.4 (L) 3.5 - 5.1 mmol/L   Chloride 106 98 - 111 mmol/L   CO2 20 (L) 22 - 32 mmol/L   Glucose, Bld 116 (H) 70 - 99 mg/dL   BUN 19 8 - 23 mg/dL   Creatinine, Ser 1.11 (H) 0.44 - 1.00 mg/dL  Calcium 7.6 (L) 8.9 - 10.3 mg/dL   GFR, Estimated 53 (L) >60 mL/min   Anion gap 8 5 - 15  CBC     Status: Abnormal   Collection Time: 01/14/21  4:15 AM  Result Value Ref Range   WBC 6.4 4.0 - 10.5 K/uL   RBC 3.06 (L) 3.87 - 5.11 MIL/uL   Hemoglobin 8.5 (L) 12.0 - 15.0 g/dL   HCT 27.4 (L) 36.0 - 46.0 %   MCV 89.5 80.0 - 100.0 fL   MCH 27.8 26.0 - 34.0 pg   MCHC 31.0 30.0 - 36.0 g/dL   RDW 15.1 11.5 - 15.5 %   Platelets 154 150 - 400 K/uL   nRBC 0.0 0.0 - 0.2 %  MRSA PCR Screening     Status: None   Collection Time: 01/14/21  9:01 AM   Specimen: Nasal Mucosa; Nasopharyngeal  Result Value Ref Range   MRSA by PCR NEGATIVE NEGATIVE   Recent Results (from the past 240 hour(s))  Resp Panel by RT-PCR (Flu A&B, Covid) Nasopharyngeal Swab     Status: None   Collection Time: 01/13/21  6:06 AM   Specimen: Nasopharyngeal Swab; Nasopharyngeal(NP) swabs in vial transport medium  Result Value Ref Range Status   SARS Coronavirus 2 by RT PCR NEGATIVE NEGATIVE Final    Comment: (NOTE) SARS-CoV-2 target nucleic acids are NOT DETECTED.  The SARS-CoV-2 RNA is generally detectable in upper respiratory specimens during the acute phase of infection. The lowest concentration of SARS-CoV-2 viral copies this assay can detect is 138 copies/mL. A negative result does not preclude SARS-Cov-2 infection and should not be used as the sole basis for treatment or other patient management  decisions. A negative result may occur with  improper specimen collection/handling, submission of specimen other than nasopharyngeal swab, presence of viral mutation(s) within the areas targeted by this assay, and inadequate number of viral copies(<138 copies/mL). A negative result must be combined with clinical observations, patient history, and epidemiological information. The expected result is Negative.  Fact Sheet for Patients:  EntrepreneurPulse.com.au  Fact Sheet for Healthcare Providers:  IncredibleEmployment.be  This test is no t yet approved or cleared by the [place] FDA and  has been authorized for detection and/or diagnosis of SARS-CoV-2 by FDA under an Emergency Use Authorization (EUA). This EUA will remain  in effect (meaning this test can be used) for the duration of the COVID-19 declaration under Section 564(b)(1) of the Act, 21 U.S.C.section 360bbb-3(b)(1), unless the authorization is terminated  or revoked sooner.       Influenza A by PCR NEGATIVE NEGATIVE Final   Influenza B by PCR NEGATIVE NEGATIVE Final    Comment: (NOTE) The Xpert Xpress SARS-CoV-2/FLU/RSV plus assay is intended as an aid in the diagnosis of influenza from Nasopharyngeal swab specimens and should not be used as a sole basis for treatment. Nasal washings and aspirates are unacceptable for Xpert Xpress SARS-CoV-2/FLU/RSV testing.  Fact Sheet for Patients: EntrepreneurPulse.com.au  Fact Sheet for Healthcare Providers: IncredibleEmployment.be  This test is not yet approved or cleared by the [place] FDA and has been authorized for detection and/or diagnosis of SARS-CoV-2 by FDA under an Emergency Use Authorization (EUA). This EUA will remain in effect (meaning this test can be used) for the duration of the COVID-19 declaration under Section 564(b)(1) of the Act, 21 U.S.C. section 360bbb-3(b)(1), unless the  authorization is terminated or revoked.  Performed at [facility], [place] [address] [place], [place] [address]  Blood Culture (routine x 2)     Status: None (Preliminary result)   Collection Time: 01/13/21  7:20 PM   Specimen: BLOOD  Result Value Ref Range Status   Specimen Description   Final    BLOOD LEFT ANTECUBITAL Performed at [facility] [address], [place], [place] [address]    Special Requests   Final    BOTTLES DRAWN AEROBIC AND ANAEROBIC Blood Culture adequate volume Performed at [facility] [address], [place], [place] [address]    Culture   Final    NO GROWTH < 12 HOURS Performed at [facility] [address], [place], [place] [address]    Report Status PENDING  Incomplete  Blood Culture (routine x 2)     Status: None (Preliminary result)   Collection Time: 01/13/21  7:31 PM   Specimen: BLOOD  Result Value Ref Range Status   Specimen Description   Final    BLOOD RIGHT PICC LINE Performed at [facility], [place] [address] [place], [place] [address]    Special Requests   Final    BOTTLES DRAWN AEROBIC AND ANAEROBIC Blood Culture adequate volume Performed at [facility] [address] [place],  [address]    Culture   Final    NO GROWTH < 12 HOURS Performed at [facility] [address], [place],  [address]    Report Status PENDING  Incomplete  Resp Panel by RT-PCR (Flu A&B, Covid) Nasopharyngeal Swab     Status: None   Collection Time: 01/13/21  7:41 PM   Specimen: Nasopharyngeal Swab; Nasopharyngeal(NP) swabs in vial transport medium  Result Value Ref Range Status   SARS Coronavirus 2 by RT PCR NEGATIVE NEGATIVE Final    Comment: (NOTE) SARS-CoV-2 target nucleic acids are NOT DETECTED.  The SARS-CoV-2 RNA is generally detectable in upper respiratory specimens during the acute phase of infection. The lowest concentration  of SARS-CoV-2 viral copies this assay can detect is 138 copies/mL. A negative result does not preclude SARS-Cov-2 infection and should not be used as the sole basis for treatment or other patient management decisions. A negative result may occur with  improper specimen collection/handling, submission of specimen other than nasopharyngeal swab, presence of viral mutation(s) within the areas targeted by this assay, and inadequate number of viral copies(<138 copies/mL). A negative result must be combined with clinical observations, patient history, and epidemiological information. The expected result is Negative.  Fact Sheet for Patients:  EntrepreneurPulse.com.au  Fact Sheet for Healthcare Providers:  IncredibleEmployment.be  This test is no t yet approved or cleared by the [place] FDA and  has been authorized for detection and/or diagnosis of SARS-CoV-2 by FDA under an Emergency Use Authorization (EUA). This EUA will remain  in effect (meaning this test can be used) for the duration of the COVID-19 declaration under Section 564(b)(1) of the Act, 21 U.S.C.section 360bbb-3(b)(1), unless the authorization is terminated  or revoked sooner.       Influenza A by PCR NEGATIVE NEGATIVE Final   Influenza B by PCR NEGATIVE NEGATIVE Final    Comment: (NOTE) The Xpert Xpress SARS-CoV-2/FLU/RSV plus assay is intended as an aid in the diagnosis of influenza from Nasopharyngeal swab specimens and should not be used as a sole basis for treatment. Nasal washings and aspirates are unacceptable for Xpert Xpress SARS-CoV-2/FLU/RSV testing.  Fact Sheet for Patients: EntrepreneurPulse.com.au  Fact Sheet for Healthcare Providers: IncredibleEmployment.be  This test  is not yet approved or cleared by the [place] and has been authorized for detection and/or diagnosis of SARS-CoV-2 by FDA under an Emergency Use  Authorization (EUA). This EUA will remain in effect (meaning this test can be used) for the duration of the COVID-19 declaration under Section 564(b)(1) of the Act, 21 U.S.C. section 360bbb-3(b)(1), unless the authorization is terminated or revoked.  Performed at [facility], [place] [address] [place], [place] [address]   MRSA PCR Screening     Status: None   Collection Time: 01/14/21  9:01 AM   Specimen: Nasal Mucosa; Nasopharyngeal  Result Value Ref Range Status   MRSA by PCR NEGATIVE NEGATIVE Final    Comment:        The GeneXpert MRSA Assay (FDA approved for NASAL specimens only), is one component of a comprehensive MRSA colonization surveillance program. It is not intended to diagnose MRSA infection nor to guide or monitor treatment for MRSA infections. Performed at [facility], [place] [address], [place], [place] [address]     Renal Function: Recent Labs    01/13/21 0744 01/13/21 1941 01/14/21 0415  CREATININE 0.90 1.16* 1.11*   Estimated Creatinine Clearance: 47.1 mL/min (A) (by C-G formula based on SCr of 1.11 mg/dL (H)).  Radiologic Imaging: DG Chest Port 1 View  Result Date: 01/13/2021 CLINICAL DATA:  70 year old female with concern for sepsis. EXAM: PORTABLE CHEST 1 VIEW COMPARISON:  Chest radiograph dated 01/21/2013. chest CT report dated 11/04/2020. The images and non available for viewing. FINDINGS: There is rounded prominence of the right hilum which may represent adenopathy or developing opacity. A mass is less likely as none was reported on the recent chest CT of 11/04/2020. CT may provide better evaluation if clinically indicated. There is mild eventration of the right hemidiaphragm. Mild diffuse interstitial prominence may be chronic. Atypical infection is less likely. Clinical correlation is recommended. No pleural effusion, or pneumothorax. Mild cardiomegaly. no acute osseous pathology. IMPRESSION: 1. Rounded prominence  of the right hilum may represent adenopathy or developing opacity. Clinical correlation is recommended. 2. Mild diffuse interstitial prominence may be chronic. Atypical infection is less likely. Electronically Signed   By: Anner Crete M.D.   On: 01/13/2021 19:44    Impression/Recommendation: Sepsis due to complicated UTI and pyelonephritis--Cr stable and WBC improving. Obtain RUS to eval for hydro.  If hydro present will need to replace stent. Continue IV ABx and IVF.  Review cultures when avail, although not likely to show much given the pt has been on IV ABx for 8 days.    Hematuria not unusual due to recent ureteral stent and should resolve.   Hilar opacity--follow CXR to ensure pt not developing PNA.  IS.  DVT prophy  Debbrah Alar 01/14/2021, 2:44 PM

## 2021-01-14 NOTE — Progress Notes (Addendum)
PROGRESS NOTE  Ashley Moore VOH:[id number] DOB: 1951/04/05   PCP: [name], MD  Patient is from: Home  DOA: 01/13/2021 LOS: 0  Chief complaints: Fever, chills and dry cough  Brief Narrative / Interim history: 70 year old F with history of anal cancer followed by Dr. Benay Spice, and recent hospitalization from 4/21-4//27 for Klebsiella pneumonia bacteremia, pyelonephritis, obstructive uropathy with right hydronephrosis for which she had right ureteral stent and discharged on IV Invanz via PICC line through 01/14/2021.  She had ureteral stent removed on 4/27 and developed fever, chills and dry cough after she returned home that prompted her to come to ED.  Patient was febrile, tachycardic and tachypneic in ED.  UA with pyuria and hematuria.  CXR with some hilar opacity.  Lactic acid within normal.  EDP discussed with on-call urologist who recommended IV antibiotics.  Cultures drawn.  Admitted with working diagnosis of sepsis.   Subjective: Seen and examined earlier this morning.  No major events overnight or this morning.  Feels better this morning.  She denies chest pain, shortness of breath, GI or UTI symptoms.  Objective: Vitals:   01/13/21 2241 01/14/21 0246 01/14/21 0322 01/14/21 0600  BP: 124/70 121/72 (!) 101/55 106/68  Pulse: 100 (!) 108 97 84  Resp: 20 20 20 20   Temp: (!) 100.7 F (38.2 C) 99.2 F (37.3 C) 99.9 F (37.7 C) 98.1 F (36.7 C)  TempSrc: Oral Oral Oral Oral  SpO2: 95% 94% 93% 99%    Intake/Output Summary (Last 24 hours) at 01/14/2021 1309 Last data filed at 01/14/2021 0857 Gross per 24 hour  Intake 4270.3 ml  Output 1350 ml  Net 2920.3 ml   There were no vitals filed for this visit.  Examination:  GENERAL: No apparent distress.  Nontoxic. HEENT: MMM.  Vision and hearing grossly intact.  NECK: Supple.  No apparent JVD.  RESP:  No IWOB.  Fair aeration bilaterally. CVS:  RRR. Heart sounds normal.  ABD/GI/GU: BS+. Abd soft, NTND.  MSK/EXT:   Moves extremities. No apparent deformity. No edema.  SKIN: no apparent skin lesion or wound NEURO: Awake, alert and oriented appropriately.  No apparent focal neuro deficit. PSYCH: Calm. Normal affect.  Procedures:  None  Microbiology summarized: COVID-19 PCR nonreactive. MRSA PCR nonreactive. Blood cultures NGTD. Urine culture pending  Assessment & Plan: Sepsis due to complicated UTI in patient with recent pyelonephritis, bacteremia and right hydronephrosis s/p ureteral stent and removal.  UA with pyuria and hematuria.  She had fever, tachycardia and tachypnea on presentation.  Lactic acid within normal.  Blood cultures NGTD.  Urine culture pending.  MRSA PCR negative. -Discontinue IV vancomycin -Continue IV Invanz pending urine culture. -Continue IV fluid  History of obstructive uropathy/right hydronephrosis s/p ureteral stent which was removed on 4/27. -Antibiotics as above -Monitor urine output and renal function -Check renal ultrasound -Consult urology for further input.  Followed by Dr. Tresa Moore  Addendum -Discussed with urology, Dr. Junious Silk who is in agreement with the above plan plus CT abdomen and pelvis if renal US  Normocytic anemia: Noted about 2.5 g drop in Hgb.  Likely dilutional from IV fluid resuscitation.  She denies melena or hematochezia.  However, she has some hematuria Recent Labs    03/02/20 1002 03/13/20 1325 03/27/20 1405 04/13/20 1352 06/12/20 1228 06/17/20 0906 10/28/20 0834 01/13/21 0744 01/13/21 1941 01/14/21 0415  HGB 13.1 12.7 12.0 12.1 13.6 12.0 11.7* 11.6* 10.3* 8.5*  -Continue monitoring  AKI: Creatinine seems to be plateauing. Recent Labs  01/28/20 0004 03/02/20 1002 03/13/20 1325 03/27/20 1405 06/12/20 1228 06/17/20 0906 10/28/20 0834 01/13/21 0744 01/13/21 1941 01/14/21 0415  BUN 22 15 14 11 14 16 17 16 19 19   CREATININE 0.96 0.81 0.80 0.77 0.60 0.70 0.77 0.90 1.16* 1.11*  -Continue monitoring -Continue IV  fluid -Monitor urine output -Renal ultrasound -Avoid nephrotoxic meds  Hypokalemia: K3.4. -K-Dur 40 mill equivalent x1  Hyponatremia: Na 134, stable -Recheck  History of anal cancer-Per last oncology note, seems to be in remission after she completed course of Xeloda and radiation on 04/13/2020.  She is followed by Dr. Benay Spice -Outpatient follow-up.  Hypothyroidism -Continue home Synthroid    There is no height or weight on file to calculate BMI.         DVT prophylaxis:  enoxaparin (LOVENOX) injection 40 mg Start: 01/14/21 1000  Code Status: Full code Family Communication: Patient and/or RN. Available if any question.  Level of care: Med-Surg Status is: Observation  The patient will require care spanning > 2 midnights and should be moved to inpatient because: Persistent severe electrolyte disturbances, Ongoing diagnostic testing needed not appropriate for outpatient work up, IV treatments appropriate due to intensity of illness or inability to take PO and Inpatient level of care appropriate due to severity of illness  Dispo: The patient is from: Home              Anticipated d/c is to: Home              Patient currently is not medically stable to d/c.   Difficult to place patient No       Consultants:  Urology   Sch Meds:  Scheduled Meds: . enoxaparin (LOVENOX) injection  40 mg Subcutaneous Q24H  . feeding supplement  237 mL Oral BID BM  . levothyroxine  50 mcg Oral QAC breakfast  . sodium chloride flush  10-40 mL Intracatheter Q12H   Continuous Infusions: . sodium chloride 125 mL/hr at 01/14/21 0832  . ertapenem     PRN Meds:.acetaminophen, sodium chloride flush  Antimicrobials: Anti-infectives (From admission, onward)   Start     Dose/Rate Route Frequency Ordered Stop   01/15/21 0800  vancomycin (VANCOREADY) IVPB 750 mg/150 mL  Status:  Discontinued        750 mg 150 mL/hr over 60 Minutes Intravenous Every 24 hours 01/14/21 0651 01/14/21 1235    01/14/21 1600  ertapenem (INVANZ) 1,000 mg in sodium chloride 0.9 % 100 mL IVPB        1 g 200 mL/hr over 30 Minutes Intravenous Every 24 hours 01/13/21 2345     01/14/21 0745  vancomycin (VANCOREADY) IVPB 1750 mg/350 mL        1,750 mg 175 mL/hr over 120 Minutes Intravenous  Once 01/14/21 0649 01/14/21 1026   01/13/21 1915  ertapenem (INVANZ) 1,000 mg in sodium chloride 0.9 % 100 mL IVPB  Status:  Discontinued        1 g 200 mL/hr over 30 Minutes Intravenous  Once 01/13/21 1855 01/13/21 1902       I have personally reviewed the following labs and images: CBC: Recent Labs  Lab 01/13/21 0744 01/13/21 1941 01/14/21 0415  WBC  --  9.7 6.4  NEUTROABS  --  9.0*  --   HGB 11.6* 10.3* 8.5*  HCT 34.0* 32.8* 27.4*  MCV  --  87.0 89.5  PLT  --  235 154   BMP &GFR Recent Labs  Lab 01/13/21 0744 01/13/21 1941 01/14/21 0415  NA 139 134* 134*  K 3.5 3.7 3.4*  CL 100 100 106  CO2  --  23 20*  GLUCOSE 100* 119* 116*  BUN 16 19 19   CREATININE 0.90 1.16* 1.11*  CALCIUM  --  8.6* 7.6*   Estimated Creatinine Clearance: 47.1 mL/min (A) (by C-G formula based on SCr of 1.11 mg/dL (H)). Liver & Pancreas: Recent Labs  Lab 01/13/21 1941  AST 52*  ALT 40  ALKPHOS 121  BILITOT 0.3  PROT 7.3  ALBUMIN 3.2*   No results for input(s): LIPASE, AMYLASE in the last 168 hours. No results for input(s): AMMONIA in the last 168 hours. Diabetic: No results for input(s): HGBA1C in the last 72 hours. No results for input(s): GLUCAP in the last 168 hours. Cardiac Enzymes: No results for input(s): CKTOTAL, CKMB, CKMBINDEX, TROPONINI in the last 168 hours. No results for input(s): PROBNP in the last 8760 hours. Coagulation Profile: Recent Labs  Lab 01/13/21 1941  INR 1.2   Thyroid Function Tests: No results for input(s): TSH, T4TOTAL, FREET4, T3FREE, THYROIDAB in the last 72 hours. Lipid Profile: No results for input(s): CHOL, HDL, LDLCALC, TRIG, CHOLHDL, LDLDIRECT in the last 72  hours. Anemia Panel: No results for input(s): VITAMINB12, FOLATE, FERRITIN, TIBC, IRON, RETICCTPCT in the last 72 hours. Urine analysis:    Component Value Date/Time   COLORURINE YELLOW 01/13/2021 1938   APPEARANCEUR CLOUDY (A) 01/13/2021 1938   LABSPEC 1.026 01/13/2021 1938   LABSPEC 1.010 02/14/2017 1604   PHURINE 6.0 01/13/2021 1938   GLUCOSEU NEGATIVE 01/13/2021 1938   GLUCOSEU Negative 02/14/2017 1604   HGBUR LARGE (A) 01/13/2021 1938   BILIRUBINUR NEGATIVE 01/13/2021 1938   BILIRUBINUR Negative 01/08/2020 0949   BILIRUBINUR Negative 02/14/2017 1604   KETONESUR 5 (A) 01/13/2021 1938   PROTEINUR 100 (A) 01/13/2021 1938   UROBILINOGEN 0.2 01/08/2020 0949   UROBILINOGEN 0.2 02/14/2017 1604   NITRITE NEGATIVE 01/13/2021 1938   LEUKOCYTESUR LARGE (A) 01/13/2021 1938   LEUKOCYTESUR Small 02/14/2017 1604   Sepsis Labs: Invalid input(s): PROCALCITONIN, [place]  Microbiology: Recent Results (from the past 240 hour(s))  Resp Panel by RT-PCR (Flu A&B, Covid) Nasopharyngeal Swab     Status: None   Collection Time: 01/13/21  6:06 AM   Specimen: Nasopharyngeal Swab; Nasopharyngeal(NP) swabs in vial transport medium  Result Value Ref Range Status   SARS Coronavirus 2 by RT PCR NEGATIVE NEGATIVE Final    Comment: (NOTE) SARS-CoV-2 target nucleic acids are NOT DETECTED.  The SARS-CoV-2 RNA is generally detectable in upper respiratory specimens during the acute phase of infection. The lowest concentration of SARS-CoV-2 viral copies this assay can detect is 138 copies/mL. A negative result does not preclude SARS-Cov-2 infection and should not be used as the sole basis for treatment or other patient management decisions. A negative result may occur with  improper specimen collection/handling, submission of specimen other than nasopharyngeal swab, presence of viral mutation(s) within the areas targeted by this assay, and inadequate number of viral copies(<138 copies/mL). A negative  result must be combined with clinical observations, patient history, and epidemiological information. The expected result is Negative.  Fact Sheet for Patients:  EntrepreneurPulse.com.au  Fact Sheet for Healthcare Providers:  IncredibleEmployment.be  This test is no t yet approved or cleared by the [place] FDA and  has been authorized for detection and/or diagnosis of SARS-CoV-2 by FDA under an Emergency Use Authorization (EUA). This EUA will remain  in effect (meaning this test can be used) for the duration of  the COVID-19 declaration under Section 564(b)(1) of the Act, 21 U.S.C.section 360bbb-3(b)(1), unless the authorization is terminated  or revoked sooner.       Influenza A by PCR NEGATIVE NEGATIVE Final   Influenza B by PCR NEGATIVE NEGATIVE Final    Comment: (NOTE) The Xpert Xpress SARS-CoV-2/FLU/RSV plus assay is intended as an aid in the diagnosis of influenza from Nasopharyngeal swab specimens and should not be used as a sole basis for treatment. Nasal washings and aspirates are unacceptable for Xpert Xpress SARS-CoV-2/FLU/RSV testing.  Fact Sheet for Patients: EntrepreneurPulse.com.au  Fact Sheet for Healthcare Providers: IncredibleEmployment.be  This test is not yet approved or cleared by the [place] FDA and has been authorized for detection and/or diagnosis of SARS-CoV-2 by FDA under an Emergency Use Authorization (EUA). This EUA will remain in effect (meaning this test can be used) for the duration of the COVID-19 declaration under Section 564(b)(1) of the Act, 21 U.S.C. section 360bbb-3(b)(1), unless the authorization is terminated or revoked.  Performed at [facility], [place] [address] [place], [place] [address]   Blood Culture (routine x 2)     Status: None (Preliminary result)   Collection Time: 01/13/21  7:20 PM   Specimen: BLOOD  Result Value Ref  Range Status   Specimen Description   Final    BLOOD LEFT ANTECUBITAL Performed at [place] [address], [place], [place] [address]    Special Requests   Final    BOTTLES DRAWN AEROBIC AND ANAEROBIC Blood Culture adequate volume Performed at [place] [address] [place], [place] [address]    Culture   Final    NO GROWTH < 12 HOURS Performed at [facility] [address], [place], [place] [address]    Report Status PENDING  Incomplete  Blood Culture (routine x 2)     Status: None (Preliminary result)   Collection Time: 01/13/21  7:31 PM   Specimen: BLOOD  Result Value Ref Range Status   Specimen Description   Final    BLOOD RIGHT PICC LINE Performed at [facility], [place] [address], [place], [place] [address]    Special Requests   Final    BOTTLES DRAWN AEROBIC AND ANAEROBIC Blood Culture adequate volume Performed at [place] [address] [place], [place] [address]    Culture   Final    NO GROWTH < 12 HOURS Performed at [facility] [address], [place], [place] [address]    Report Status PENDING  Incomplete  Resp Panel by RT-PCR (Flu A&B, Covid) Nasopharyngeal Swab     Status: None   Collection Time: 01/13/21  7:41 PM   Specimen: Nasopharyngeal Swab; Nasopharyngeal(NP) swabs in vial transport medium  Result Value Ref Range Status   SARS Coronavirus 2 by RT PCR NEGATIVE NEGATIVE Final    Comment: (NOTE) SARS-CoV-2 target nucleic acids are NOT DETECTED.  The SARS-CoV-2 RNA is generally detectable in upper respiratory specimens during the acute phase of infection. The lowest concentration of SARS-CoV-2 viral copies this assay can detect is 138 copies/mL. A negative result does not preclude SARS-Cov-2 infection and should not be used as the sole basis for treatment or other patient management decisions. A negative result may occur with  improper specimen  collection/handling, submission of specimen other than nasopharyngeal swab, presence of viral mutation(s) within the areas targeted by this assay, and inadequate number of viral copies(<138 copies/mL). A negative result  must be combined with clinical observations, patient history, and epidemiological information. The expected result is Negative.  Fact Sheet for Patients:  EntrepreneurPulse.com.au  Fact Sheet for Healthcare Providers:  IncredibleEmployment.be  This test is no t yet approved or cleared by the [place] FDA and  has been authorized for detection and/or diagnosis of SARS-CoV-2 by FDA under an Emergency Use Authorization (EUA). This EUA will remain  in effect (meaning this test can be used) for the duration of the COVID-19 declaration under Section 564(b)(1) of the Act, 21 U.S.C.section 360bbb-3(b)(1), unless the authorization is terminated  or revoked sooner.       Influenza A by PCR NEGATIVE NEGATIVE Final   Influenza B by PCR NEGATIVE NEGATIVE Final    Comment: (NOTE) The Xpert Xpress SARS-CoV-2/FLU/RSV plus assay is intended as an aid in the diagnosis of influenza from Nasopharyngeal swab specimens and should not be used as a sole basis for treatment. Nasal washings and aspirates are unacceptable for Xpert Xpress SARS-CoV-2/FLU/RSV testing.  Fact Sheet for Patients: EntrepreneurPulse.com.au  Fact Sheet for Healthcare Providers: IncredibleEmployment.be  This test is not yet approved or cleared by the [place] FDA and has been authorized for detection and/or diagnosis of SARS-CoV-2 by FDA under an Emergency Use Authorization (EUA). This EUA will remain in effect (meaning this test can be used) for the duration of the COVID-19 declaration under Section 564(b)(1) of the Act, 21 U.S.C. section 360bbb-3(b)(1), unless the authorization is terminated or revoked.  Performed at [facility], [place] [address] [place], [place] [address]   MRSA PCR Screening     Status: None   Collection Time: 01/14/21  9:01 AM   Specimen: Nasal Mucosa; Nasopharyngeal  Result Value Ref Range Status   MRSA by PCR NEGATIVE NEGATIVE Final    Comment:        The GeneXpert MRSA Assay (FDA approved for NASAL specimens only), is one component of a comprehensive MRSA colonization surveillance program. It is not intended to diagnose MRSA infection nor to guide or monitor treatment for MRSA infections. Performed at [facility], [place] [address], [place], [place] [address]     Radiology Studies: [facility] Chest [place] 1 View  Result Date: 01/13/2021 CLINICAL DATA:  70 year old female with concern for sepsis. EXAM: PORTABLE CHEST 1 VIEW COMPARISON:  Chest radiograph dated 01/21/2013. chest CT report dated 11/04/2020. The images and non available for viewing. FINDINGS: There is rounded prominence of the right hilum which may represent adenopathy or developing opacity. A mass is less likely as none was reported on the recent chest CT of 11/04/2020. CT may provide better evaluation if clinically indicated. There is mild eventration of the right hemidiaphragm. Mild diffuse interstitial prominence may be chronic. Atypical infection is less likely. Clinical correlation is recommended. No pleural effusion, or pneumothorax. Mild cardiomegaly. no acute osseous pathology. IMPRESSION: 1. Rounded prominence of the right hilum may represent adenopathy or developing opacity. Clinical correlation is recommended. 2. Mild diffuse interstitial prominence may be chronic. Atypical infection is less likely. Electronically Signed   By: Anner Crete M.D.   On: 01/13/2021 19:44     Dontrez Pettis T. [place]  If 7PM-7AM, please contact night-coverage [email or web address] 01/14/2021, 1:09 PM

## 2021-01-15 ENCOUNTER — Encounter (HOSPITAL_BASED_OUTPATIENT_CLINIC_OR_DEPARTMENT_OTHER): Payer: Self-pay | Admitting: Urology

## 2021-01-15 DIAGNOSIS — G4700 Insomnia, unspecified: Secondary | ICD-10-CM | POA: Diagnosis not present

## 2021-01-15 DIAGNOSIS — N1330 Unspecified hydronephrosis: Secondary | ICD-10-CM

## 2021-01-15 DIAGNOSIS — Z85048 Personal history of other malignant neoplasm of rectum, rectosigmoid junction, and anus: Secondary | ICD-10-CM | POA: Diagnosis not present

## 2021-01-15 DIAGNOSIS — R6510 Systemic inflammatory response syndrome (SIRS) of non-infectious origin without acute organ dysfunction: Secondary | ICD-10-CM | POA: Diagnosis not present

## 2021-01-15 DIAGNOSIS — N390 Urinary tract infection, site not specified: Secondary | ICD-10-CM | POA: Diagnosis not present

## 2021-01-15 DIAGNOSIS — C210 Malignant neoplasm of anus, unspecified: Secondary | ICD-10-CM | POA: Diagnosis not present

## 2021-01-15 DIAGNOSIS — A419 Sepsis, unspecified organism: Secondary | ICD-10-CM | POA: Diagnosis not present

## 2021-01-15 DIAGNOSIS — R7881 Bacteremia: Secondary | ICD-10-CM | POA: Diagnosis not present

## 2021-01-15 DIAGNOSIS — N179 Acute kidney failure, unspecified: Secondary | ICD-10-CM | POA: Diagnosis not present

## 2021-01-15 DIAGNOSIS — N12 Tubulo-interstitial nephritis, not specified as acute or chronic: Secondary | ICD-10-CM | POA: Diagnosis not present

## 2021-01-15 DIAGNOSIS — J189 Pneumonia, unspecified organism: Secondary | ICD-10-CM | POA: Diagnosis not present

## 2021-01-15 LAB — RENAL FUNCTION PANEL
Albumin: 2.3 g/dL — ABNORMAL LOW (ref 3.5–5.0)
Anion gap: 6 (ref 5–15)
BUN: 19 mg/dL (ref 8–23)
CO2: 21 mmol/L — ABNORMAL LOW (ref 22–32)
Calcium: 8 mg/dL — ABNORMAL LOW (ref 8.9–10.3)
Chloride: 108 mmol/L (ref 98–111)
Creatinine, Ser: 0.92 mg/dL (ref 0.44–1.00)
GFR, Estimated: >60 mL/min (ref >60)
Glucose, Bld: 106 mg/dL — ABNORMAL HIGH (ref 70–99)
Phosphorus: 2.4 mg/dL — ABNORMAL LOW (ref 2.5–4.6)
Potassium: 3.8 mmol/L (ref 3.5–5.1)
Sodium: 135 mmol/L (ref 135–145)

## 2021-01-15 LAB — URINE CULTURE: Culture: NO GROWTH

## 2021-01-15 LAB — CBC
HCT: 26.5 % — ABNORMAL LOW (ref 36.0–46.0)
Hemoglobin: 8.2 g/dL — ABNORMAL LOW (ref 12.0–15.0)
MCH: 27.2 pg (ref 26.0–34.0)
MCHC: 30.9 g/dL (ref 30.0–36.0)
MCV: 87.7 fL (ref 80.0–100.0)
Platelets: 175 10*3/uL (ref 150–400)
RBC: 3.02 MIL/uL — ABNORMAL LOW (ref 3.87–5.11)
RDW: 15.4 % (ref 11.5–15.5)
WBC: 7.5 10*3/uL (ref 4.0–10.5)
nRBC: 0 % (ref 0.0–0.2)

## 2021-01-15 LAB — MAGNESIUM: Magnesium: 1.7 mg/dL (ref 1.7–2.4)

## 2021-01-15 MED ORDER — POLYETHYLENE GLYCOL 3350 17 G PO PACK: 17.0000 g | PACK | Freq: Two times a day (BID) | ORAL | Status: DC | PRN

## 2021-01-15 MED ORDER — SENNOSIDES-DOCUSATE SODIUM 8.6-50 MG PO TABS: 1.0000 | ORAL_TABLET | Freq: Two times a day (BID) | ORAL | Status: DC | PRN

## 2021-01-15 MED ORDER — MORPHINE SULFATE (PF) 2 MG/ML IV SOLN
0.5000 mg | Freq: Once | INTRAVENOUS | Status: AC
  Administered 2021-01-15: 0.5 mg via INTRAVENOUS
  Filled 2021-01-15: qty 1

## 2021-01-15 MED ORDER — MAGNESIUM SULFATE 2 GM/50ML IV SOLN
2.0000 g | Freq: Once | INTRAVENOUS | Status: AC
  Administered 2021-01-15: 2 g via INTRAVENOUS
  Filled 2021-01-15: qty 50

## 2021-01-15 MED ORDER — TRAMADOL HCL 50 MG PO TABS
50.0000 mg | ORAL_TABLET | Freq: Three times a day (TID) | ORAL | Status: DC | PRN
  Filled 2021-01-15: qty 1

## 2021-01-15 NOTE — Progress Notes (Signed)
PROGRESS NOTE  Ashley Moore:[id number] DOB: [birth year]   PCP: [name], MD  Patient is from: Home  DOA: 01/13/2021 LOS: 0  Chief complaints: Fever, chills and dry cough  Brief Narrative / Interim history: 70 year old F with history of anal cancer followed by Dr. Benay Spice, and recent hospitalization from 4/21-4//27 for Klebsiella pneumonia bacteremia, pyelonephritis, obstructive uropathy with right hydronephrosis for which she had right ureteral stent and discharged on IV Invanz via PICC line through 01/14/2021.  She had ureteral stent removed on 4/27 and developed fever, chills and dry cough after she returned home that prompted her to come to ED.  Patient was febrile, tachycardic and tachypneic in ED.  UA with pyuria and hematuria.  CXR with some hilar opacity.  Lactic acid within normal.  EDP discussed with on-call urologist who recommended IV antibiotics.  Cultures drawn.  Admitted with working diagnosis of sepsis.   Fever curve down trended.  Sepsis physiology resolved.  Blood and urine cultures NGTD.  MRSA PCR negative.  Renal function improved.  Antibiotics discontinued.  Renal US showed moderate right-sided hydronephrosis.  Given her previous history of significant pain with stent, her urologist recommended allowing some degree of hydronephrosis as long as renal function is a stable, and also observing off antibiotics until she is afebrile for 24 hours.   Subjective: Seen and examined earlier this morning.  No major events overnight or this morning.  She reports pain across lower abdomen.  No other complaints.  Denies chest pain, dyspnea, nausea, vomiting or dysuria.  Denies back pain.  Objective: Vitals:   01/14/21 1316 01/14/21 2201 01/15/21 0543 01/15/21 1302  BP: 111/67 126/65 130/66 (!) 147/77  Pulse: 89 93 86 96  Resp: 14 20 20  (!) 22  Temp: 98.6 F (37 C) 100.1 F (37.8 C) 98.5 F (36.9 C) 98.8 F (37.1 C)  TempSrc: Oral Oral Oral Oral  SpO2: 97% 97%  95% 100%    Intake/Output Summary (Last 24 hours) at 01/15/2021 1650 Last data filed at 01/15/2021 0556 Gross per 24 hour  Intake 700 ml  Output 780 ml  Net -80 ml   There were no vitals filed for this visit.  Examination: GENERAL: No apparent distress.  Nontoxic. HEENT: MMM.  Vision and hearing grossly intact.  NECK: Supple.  No apparent JVD.  RESP: On RA.  No IWOB.  Fair aeration bilaterally. CVS:  RRR. Heart sounds normal.  ABD/GI/GU: BS+. Abd soft, NTND.  MSK/EXT:  Moves extremities. No apparent deformity. No edema.  SKIN: no apparent skin lesion or wound NEURO: Awake, alert and oriented appropriately.  No apparent focal neuro deficit. PSYCH: Calm. Normal affect.   Procedures:  None  Microbiology summarized: COVID-19 PCR nonreactive. MRSA PCR nonreactive. Blood cultures NGTD. Urine culture NGTD  Assessment & Plan: Sepsis due to complicated UTI in patient with recent pyelonephritis, bacteremia and right hydronephrosis s/p ureteral stent and removal.  UA with pyuria and hematuria.  She had fever, tachycardia and tachypnea on presentation.  Lactic acid within normal.  Blood and urine culture NGTD.  MRSA PCR negative.  Sepsis physiology resolved. -Discontinued IV vancomycin on 4/28 -Discontinued Invanz -Monitor off antibiotics for the next 24 hours  History of obstructive uropathy/right hydronephrosis s/p ureteral stent which was removed on 4/27.  Renal ultrasound with moderate hydronephrosis on the right. -Her urologist, Dr. Tresa Moore recommended allowing some degree of hydronephrosis given her previous history of significant pain with stent unless renal function is worse. Dr. Tresa Moore also recommended observing off  antibiotics until she is fever free for 24 hours -Monitor urine output and renal function  Normocytic anemia: Noted about 2.5 g drop in Hgb.  Likely dilutional from IV fluid resuscitation.  She denies melena or hematochezia.  However, she has some hematuria Recent Labs     03/13/20 1325 03/27/20 1405 04/13/20 1352 06/12/20 1228 06/17/20 0906 10/28/20 0834 01/13/21 0744 01/13/21 1941 01/14/21 0415 01/15/21 0614  HGB 12.7 12.0 12.1 13.6 12.0 11.7* 11.6* 10.3* 8.5* 8.2*  -Discontinue IV fluid and subcu Lovenox -SCD for VTE prophylaxis -Recheck CBC in the morning  AKI: Improving.  Excellent urine output.  Renal US with moderate right hydro Recent Labs    03/02/20 1002 03/13/20 1325 03/27/20 1405 06/12/20 1228 06/17/20 0906 10/28/20 0834 01/13/21 0744 01/13/21 1941 01/14/21 0415 01/15/21 0614  BUN 15 14 11 14 16 17 16 19 19 19   CREATININE 0.81 0.80 0.77 0.60 0.70 0.77 0.90 1.16* 1.11* 0.92  -Recheck in the morning  Hypokalemia/hypomagnesemia: Hypokalemia resolved.  Mg 1.7. -IV magnesium sulfate 2 g x 1  Hyponatremia: Resolved.  History of anal cancer-Per last oncology note, seems to be in remission after she completed course of Xeloda and radiation on 04/13/2020.  She is followed by Dr. Benay Spice -Outpatient follow-up.  Hypothyroidism -Continue home Synthroid    There is no height or weight on file to calculate BMI.         DVT prophylaxis:  Place and maintain sequential compression device Start: 01/15/21 1648  Code Status: Full code Family Communication: Patient and/or RN. Available if any question.  Level of care: Med-Surg Status is: Observation  The patient remains OBS appropriate and will d/c before 2 midnights.  Dispo: The patient is from: Home              Anticipated d/c is to: Home on 4/30              Patient currently is not medically stable to d/c.   Difficult to place patient No        Consultants:  Urology   Sch Meds:  Scheduled Meds: . feeding supplement  237 mL Oral BID BM  . levothyroxine  50 mcg Oral QAC breakfast  . sodium chloride flush  10-40 mL Intracatheter Q12H   Continuous Infusions:  PRN Meds:.acetaminophen, sodium chloride flush, traMADol  Antimicrobials: Anti-infectives (From  admission, onward)   Start     Dose/Rate Route Frequency Ordered Stop   01/15/21 0800  vancomycin (VANCOREADY) IVPB 750 mg/150 mL  Status:  Discontinued        750 mg 150 mL/hr over 60 Minutes Intravenous Every 24 hours 01/14/21 0651 01/14/21 1235   01/14/21 1600  ertapenem (INVANZ) 1,000 mg in sodium chloride 0.9 % 100 mL IVPB  Status:  Discontinued        1 g 200 mL/hr over 30 Minutes Intravenous Every 24 hours 01/13/21 2345 01/15/21 1155   01/14/21 0745  vancomycin (VANCOREADY) IVPB 1750 mg/350 mL        1,750 mg 175 mL/hr over 120 Minutes Intravenous  Once 01/14/21 0649 01/14/21 1026   01/13/21 1915  ertapenem (INVANZ) 1,000 mg in sodium chloride 0.9 % 100 mL IVPB  Status:  Discontinued        1 g 200 mL/hr over 30 Minutes Intravenous  Once 01/13/21 1855 01/13/21 1902       I have personally reviewed the following labs and images: CBC: Recent Labs  Lab 01/13/21 0744 01/13/21 1941 01/14/21 0415 01/15/21 1287  WBC  --  9.7 6.4 7.5  NEUTROABS  --  9.0*  --   --   HGB 11.6* 10.3* 8.5* 8.2*  HCT 34.0* 32.8* 27.4* 26.5*  MCV  --  87.0 89.5 87.7  PLT  --  235 154 175   BMP &GFR Recent Labs  Lab 01/13/21 0744 01/13/21 1941 01/14/21 0415 01/15/21 0614  NA 139 134* 134* 135  K 3.5 3.7 3.4* 3.8  CL 100 100 106 108  CO2  --  23 20* 21*  GLUCOSE 100* 119* 116* 106*  BUN 16 19 19 19   CREATININE 0.90 1.16* 1.11* 0.92  CALCIUM  --  8.6* 7.6* 8.0*  MG  --   --   --  1.7  PHOS  --   --   --  2.4*   Estimated Creatinine Clearance: 56.8 mL/min (by C-G formula based on SCr of 0.92 mg/dL). Liver & Pancreas: Recent Labs  Lab 01/13/21 1941 01/15/21 0614  AST 52*  --   ALT 40  --   ALKPHOS 121  --   BILITOT 0.3  --   PROT 7.3  --   ALBUMIN 3.2* 2.3*   No results for input(s): LIPASE, AMYLASE in the last 168 hours. No results for input(s): AMMONIA in the last 168 hours. Diabetic: No results for input(s): HGBA1C in the last 72 hours. No results for input(s): GLUCAP in the  last 168 hours. Cardiac Enzymes: No results for input(s): CKTOTAL, CKMB, CKMBINDEX, TROPONINI in the last 168 hours. No results for input(s): PROBNP in the last 8760 hours. Coagulation Profile: Recent Labs  Lab 01/13/21 1941  INR 1.2   Thyroid Function Tests: No results for input(s): TSH, T4TOTAL, FREET4, T3FREE, THYROIDAB in the last 72 hours. Lipid Profile: No results for input(s): CHOL, HDL, LDLCALC, TRIG, CHOLHDL, LDLDIRECT in the last 72 hours. Anemia Panel: No results for input(s): VITAMINB12, FOLATE, FERRITIN, TIBC, IRON, RETICCTPCT in the last 72 hours. Urine analysis:    Component Value Date/Time   COLORURINE YELLOW 01/13/2021 1938   APPEARANCEUR CLOUDY (A) 01/13/2021 1938   LABSPEC 1.026 01/13/2021 1938   LABSPEC 1.010 02/14/2017 1604   PHURINE 6.0 01/13/2021 1938   GLUCOSEU NEGATIVE 01/13/2021 1938   GLUCOSEU Negative 02/14/2017 1604   HGBUR LARGE (A) 01/13/2021 1938   BILIRUBINUR NEGATIVE 01/13/2021 1938   BILIRUBINUR Negative 01/08/2020 0949   BILIRUBINUR Negative 02/14/2017 1604   KETONESUR 5 (A) 01/13/2021 1938   PROTEINUR 100 (A) 01/13/2021 1938   UROBILINOGEN 0.2 01/08/2020 0949   UROBILINOGEN 0.2 02/14/2017 1604   NITRITE NEGATIVE 01/13/2021 1938   LEUKOCYTESUR LARGE (A) 01/13/2021 1938   LEUKOCYTESUR Small 02/14/2017 1604   Sepsis Labs: Invalid input(s): PROCALCITONIN, [place]  Microbiology: Recent Results (from the past 240 hour(s))  Resp Panel by RT-PCR (Flu A&B, Covid) Nasopharyngeal Swab     Status: None   Collection Time: 01/13/21  6:06 AM   Specimen: Nasopharyngeal Swab; Nasopharyngeal(NP) swabs in vial transport medium  Result Value Ref Range Status   SARS Coronavirus 2 by RT PCR NEGATIVE NEGATIVE Final    Comment: (NOTE) SARS-CoV-2 target nucleic acids are NOT DETECTED.  The SARS-CoV-2 RNA is generally detectable in upper respiratory specimens during the acute phase of infection. The lowest concentration of SARS-CoV-2 viral copies  this assay can detect is 138 copies/mL. A negative result does not preclude SARS-Cov-2 infection and should not be used as the sole basis for treatment or other patient management decisions. A negative result may occur with  improper  specimen collection/handling, submission of specimen other than nasopharyngeal swab, presence of viral mutation(s) within the areas targeted by this assay, and inadequate number of viral copies(<138 copies/mL). A negative result must be combined with clinical observations, patient history, and epidemiological information. The expected result is Negative.  Fact Sheet for Patients:  EntrepreneurPulse.com.au  Fact Sheet for Healthcare Providers:  IncredibleEmployment.be  This test is no t yet approved or cleared by the [place] FDA and  has been authorized for detection and/or diagnosis of SARS-CoV-2 by FDA under an Emergency Use Authorization (EUA). This EUA will remain  in effect (meaning this test can be used) for the duration of the COVID-19 declaration under Section 564(b)(1) of the Act, 21 U.S.C.section 360bbb-3(b)(1), unless the authorization is terminated  or revoked sooner.       Influenza A by PCR NEGATIVE NEGATIVE Final   Influenza B by PCR NEGATIVE NEGATIVE Final    Comment: (NOTE) The Xpert Xpress SARS-CoV-2/FLU/RSV plus assay is intended as an aid in the diagnosis of influenza from Nasopharyngeal swab specimens and should not be used as a sole basis for treatment. Nasal washings and aspirates are unacceptable for Xpert Xpress SARS-CoV-2/FLU/RSV testing.  Fact Sheet for Patients: EntrepreneurPulse.com.au  Fact Sheet for Healthcare Providers: IncredibleEmployment.be  This test is not yet approved or cleared by the [place] FDA and has been authorized for detection and/or diagnosis of SARS-CoV-2 by FDA under an Emergency Use Authorization (EUA). This EUA  will remain in effect (meaning this test can be used) for the duration of the COVID-19 declaration under Section 564(b)(1) of the Act, 21 U.S.C. section 360bbb-3(b)(1), unless the authorization is terminated or revoked.  Performed at [facility], [place] [address], [place], [place] [address]   Blood Culture (routine x 2)     Status: None (Preliminary result)   Collection Time: 01/13/21  7:20 PM   Specimen: BLOOD  Result Value Ref Range Status   Specimen Description   Final    BLOOD LEFT ANTECUBITAL Performed at [facility] [address] [place], [place] [address]    Special Requests   Final    BOTTLES DRAWN AEROBIC AND ANAEROBIC Blood Culture adequate volume Performed at [facility] [address], [place], [place] [address]    Culture   Final    NO GROWTH 2 DAYS Performed at [facility] [address] [place], [place] [address]    Report Status PENDING  Incomplete  Blood Culture (routine x 2)     Status: None (Preliminary result)   Collection Time: 01/13/21  7:31 PM   Specimen: BLOOD  Result Value Ref Range Status   Specimen Description   Final    BLOOD RIGHT PICC LINE Performed at [facility], [place] [address] [place], [place] [address]    Special Requests   Final    BOTTLES DRAWN AEROBIC AND ANAEROBIC Blood Culture adequate volume Performed at [facility] [address] [place], [place] [address]    Culture   Final    NO GROWTH 2 DAYS Performed at [facility] [address] [place], [place] [address]    Report Status PENDING  Incomplete  Urine culture     Status: None   Collection Time: 01/13/21  7:38 PM   Specimen: In/Out Cath Urine  Result Value Ref Range Status   Specimen Description   Final    IN/OUT CATH URINE Performed at [facility],  [place] [address], [place], [place] [address]    Special Requests   Final     NONE Performed at [facility], [place] [address], [place], [place] [address]    Culture   Final    NO GROWTH Performed at [facility], [place] [address] [place], [place] [address]    Report Status 01/15/2021 FINAL  Final  Resp Panel by RT-PCR (Flu A&B, Covid) Nasopharyngeal Swab     Status: None   Collection Time: 01/13/21  7:41 PM   Specimen: Nasopharyngeal Swab; Nasopharyngeal(NP) swabs in vial transport medium  Result Value Ref Range Status   SARS Coronavirus 2 by RT PCR NEGATIVE NEGATIVE Final    Comment: (NOTE) SARS-CoV-2 target nucleic acids are NOT DETECTED.  The SARS-CoV-2 RNA is generally detectable in upper respiratory specimens during the acute phase of infection. The lowest concentration of SARS-CoV-2 viral copies this assay can detect is 138 copies/mL. A negative result does not preclude SARS-Cov-2 infection and should not be used as the sole basis for treatment or other patient management decisions. A negative result may occur with  improper specimen collection/handling, submission of specimen other than nasopharyngeal swab, presence of viral mutation(s) within the areas targeted by this assay, and inadequate number of viral copies(<138 copies/mL). A negative result must be combined with clinical observations, patient history, and epidemiological information. The expected result is Negative.  Fact Sheet for Patients:  EntrepreneurPulse.com.au  Fact Sheet for Healthcare Providers:  IncredibleEmployment.be  This test is no t yet approved or cleared by the [place] FDA and  has been authorized for detection and/or diagnosis of SARS-CoV-2 by FDA under an Emergency Use Authorization (EUA). This EUA will remain  in effect (meaning this test can be used) for the duration of the COVID-19 declaration under Section 564(b)(1) of the Act, 21 U.S.C.section 360bbb-3(b)(1), unless the authorization is terminated  or  revoked sooner.       Influenza A by PCR NEGATIVE NEGATIVE Final   Influenza B by PCR NEGATIVE NEGATIVE Final    Comment: (NOTE) The Xpert Xpress SARS-CoV-2/FLU/RSV plus assay is intended as an aid in the diagnosis of influenza from Nasopharyngeal swab specimens and should not be used as a sole basis for treatment. Nasal washings and aspirates are unacceptable for Xpert Xpress SARS-CoV-2/FLU/RSV testing.  Fact Sheet for Patients: EntrepreneurPulse.com.au  Fact Sheet for Healthcare Providers: IncredibleEmployment.be  This test is not yet approved or cleared by the [place] FDA and has been authorized for detection and/or diagnosis of SARS-CoV-2 by FDA under an Emergency Use Authorization (EUA). This EUA will remain in effect (meaning this test can be used) for the duration of the COVID-19 declaration under Section 564(b)(1) of the Act, 21 U.S.C. section 360bbb-3(b)(1), unless the authorization is terminated or revoked.  Performed at [facility], [place] [address], [place], [place] [address]   MRSA PCR Screening     Status: None   Collection Time: 01/14/21  9:01 AM   Specimen: Nasal Mucosa; Nasopharyngeal  Result Value Ref Range Status   MRSA by PCR NEGATIVE NEGATIVE Final    Comment:        The GeneXpert MRSA Assay (FDA approved for NASAL specimens only), is one component of a comprehensive MRSA colonization surveillance program. It is not intended to diagnose MRSA infection nor to guide or monitor treatment for MRSA infections. Performed at [facility], [place] [address] [place],  [address]     Radiology Studies: No results  found.   Dosha Broshears T. [place]  If 7PM-7AM, please contact night-coverage [email or web address] 01/15/2021, 4:50 PM

## 2021-01-16 ENCOUNTER — Observation Stay (HOSPITAL_COMMUNITY): Payer: Medicare HMO

## 2021-01-16 DIAGNOSIS — J189 Pneumonia, unspecified organism: Secondary | ICD-10-CM | POA: Diagnosis present

## 2021-01-16 DIAGNOSIS — N1330 Unspecified hydronephrosis: Secondary | ICD-10-CM | POA: Diagnosis not present

## 2021-01-16 DIAGNOSIS — E039 Hypothyroidism, unspecified: Secondary | ICD-10-CM | POA: Diagnosis present

## 2021-01-16 DIAGNOSIS — G8929 Other chronic pain: Secondary | ICD-10-CM | POA: Diagnosis present

## 2021-01-16 DIAGNOSIS — R6883 Chills (without fever): Secondary | ICD-10-CM | POA: Diagnosis not present

## 2021-01-16 DIAGNOSIS — R319 Hematuria, unspecified: Secondary | ICD-10-CM | POA: Diagnosis present

## 2021-01-16 DIAGNOSIS — E871 Hypo-osmolality and hyponatremia: Secondary | ICD-10-CM | POA: Diagnosis present

## 2021-01-16 DIAGNOSIS — C772 Secondary and unspecified malignant neoplasm of intra-abdominal lymph nodes: Secondary | ICD-10-CM | POA: Diagnosis present

## 2021-01-16 DIAGNOSIS — B961 Klebsiella pneumoniae [K. pneumoniae] as the cause of diseases classified elsewhere: Secondary | ICD-10-CM | POA: Diagnosis present

## 2021-01-16 DIAGNOSIS — Z85048 Personal history of other malignant neoplasm of rectum, rectosigmoid junction, and anus: Secondary | ICD-10-CM | POA: Diagnosis not present

## 2021-01-16 DIAGNOSIS — R059 Cough, unspecified: Secondary | ICD-10-CM | POA: Diagnosis not present

## 2021-01-16 DIAGNOSIS — R0682 Tachypnea, not elsewhere classified: Secondary | ICD-10-CM | POA: Diagnosis not present

## 2021-01-16 DIAGNOSIS — G44209 Tension-type headache, unspecified, not intractable: Secondary | ICD-10-CM

## 2021-01-16 DIAGNOSIS — N139 Obstructive and reflux uropathy, unspecified: Secondary | ICD-10-CM | POA: Diagnosis present

## 2021-01-16 DIAGNOSIS — R6510 Systemic inflammatory response syndrome (SIRS) of non-infectious origin without acute organ dysfunction: Secondary | ICD-10-CM | POA: Diagnosis present

## 2021-01-16 DIAGNOSIS — Z9109 Other allergy status, other than to drugs and biological substances: Secondary | ICD-10-CM | POA: Diagnosis not present

## 2021-01-16 DIAGNOSIS — N390 Urinary tract infection, site not specified: Secondary | ICD-10-CM | POA: Diagnosis not present

## 2021-01-16 DIAGNOSIS — Z880 Allergy status to penicillin: Secondary | ICD-10-CM | POA: Diagnosis not present

## 2021-01-16 DIAGNOSIS — N136 Pyonephrosis: Secondary | ICD-10-CM | POA: Diagnosis present

## 2021-01-16 DIAGNOSIS — E876 Hypokalemia: Secondary | ICD-10-CM | POA: Diagnosis present

## 2021-01-16 DIAGNOSIS — A4159 Other Gram-negative sepsis: Secondary | ICD-10-CM | POA: Diagnosis present

## 2021-01-16 DIAGNOSIS — D649 Anemia, unspecified: Secondary | ICD-10-CM | POA: Diagnosis not present

## 2021-01-16 DIAGNOSIS — Z923 Personal history of irradiation: Secondary | ICD-10-CM | POA: Diagnosis not present

## 2021-01-16 DIAGNOSIS — M545 Low back pain, unspecified: Secondary | ICD-10-CM | POA: Diagnosis present

## 2021-01-16 DIAGNOSIS — R509 Fever, unspecified: Secondary | ICD-10-CM | POA: Diagnosis not present

## 2021-01-16 DIAGNOSIS — Y842 Radiological procedure and radiotherapy as the cause of abnormal reaction of the patient, or of later complication, without mention of misadventure at the time of the procedure: Secondary | ICD-10-CM | POA: Diagnosis present

## 2021-01-16 DIAGNOSIS — N179 Acute kidney failure, unspecified: Secondary | ICD-10-CM | POA: Diagnosis present

## 2021-01-16 DIAGNOSIS — A419 Sepsis, unspecified organism: Secondary | ICD-10-CM | POA: Diagnosis not present

## 2021-01-16 DIAGNOSIS — R7881 Bacteremia: Secondary | ICD-10-CM | POA: Diagnosis not present

## 2021-01-16 DIAGNOSIS — N12 Tubulo-interstitial nephritis, not specified as acute or chronic: Secondary | ICD-10-CM | POA: Diagnosis not present

## 2021-01-16 DIAGNOSIS — C210 Malignant neoplasm of anus, unspecified: Secondary | ICD-10-CM | POA: Diagnosis present

## 2021-01-16 DIAGNOSIS — C7919 Secondary malignant neoplasm of other urinary organs: Secondary | ICD-10-CM | POA: Diagnosis present

## 2021-01-16 DIAGNOSIS — Z20822 Contact with and (suspected) exposure to covid-19: Secondary | ICD-10-CM | POA: Diagnosis present

## 2021-01-16 DIAGNOSIS — G4700 Insomnia, unspecified: Secondary | ICD-10-CM | POA: Diagnosis not present

## 2021-01-16 DIAGNOSIS — N3040 Irradiation cystitis without hematuria: Secondary | ICD-10-CM | POA: Diagnosis present

## 2021-01-16 LAB — CBC
HCT: 26.3 % — ABNORMAL LOW (ref 36.0–46.0)
Hemoglobin: 8.4 g/dL — ABNORMAL LOW (ref 12.0–15.0)
MCH: 27.7 pg (ref 26.0–34.0)
MCHC: 31.9 g/dL (ref 30.0–36.0)
MCV: 86.8 fL (ref 80.0–100.0)
Platelets: 209 10*3/uL (ref 150–400)
RBC: 3.03 MIL/uL — ABNORMAL LOW (ref 3.87–5.11)
RDW: 15.3 % (ref 11.5–15.5)
WBC: 7.5 10*3/uL (ref 4.0–10.5)
nRBC: 0 % (ref 0.0–0.2)

## 2021-01-16 LAB — RENAL FUNCTION PANEL
Albumin: 2.2 g/dL — ABNORMAL LOW (ref 3.5–5.0)
Anion gap: 8 (ref 5–15)
BUN: 15 mg/dL (ref 8–23)
CO2: 22 mmol/L (ref 22–32)
Calcium: 8.2 mg/dL — ABNORMAL LOW (ref 8.9–10.3)
Chloride: 104 mmol/L (ref 98–111)
Creatinine, Ser: 1 mg/dL (ref 0.44–1.00)
GFR, Estimated: >60 mL/min (ref >60)
Glucose, Bld: 108 mg/dL — ABNORMAL HIGH (ref 70–99)
Phosphorus: 2.2 mg/dL — ABNORMAL LOW (ref 2.5–4.6)
Potassium: 3.8 mmol/L (ref 3.5–5.1)
Sodium: 134 mmol/L — ABNORMAL LOW (ref 135–145)

## 2021-01-16 LAB — MAGNESIUM: Magnesium: 2 mg/dL (ref 1.7–2.4)

## 2021-01-16 MED ORDER — SODIUM CHLORIDE 0.9 % IV SOLN
1.0000 g | INTRAVENOUS | Status: AC
  Administered 2021-01-16 – 2021-01-18 (×3): 1000 mg via INTRAVENOUS
  Filled 2021-01-16 (×3): qty 1

## 2021-01-16 MED ORDER — IBUPROFEN 200 MG PO TABS
400.0000 mg | ORAL_TABLET | Freq: Once | ORAL | Status: AC
  Administered 2021-01-16: 400 mg via ORAL
  Filled 2021-01-16: qty 2

## 2021-01-16 MED ORDER — IOHEXOL 300 MG/ML  SOLN
100.0000 mL | Freq: Once | INTRAMUSCULAR | Status: AC | PRN
  Administered 2021-01-16: 100 mL via INTRAVENOUS

## 2021-01-16 NOTE — Progress Notes (Signed)
Patient called out around 0000 for antibiotic. No antibiotics ordered at this time. Patient informed. Pt would like to know if she is finished with antibiotics. Pt complain of HA, chills, febrile once this shift so far.  Will continue to monitor.

## 2021-01-16 NOTE — Progress Notes (Signed)
PROGRESS NOTE  Ashley GilfordCatherine C Moore ZOX:[id number] DOB: 10/13/1950   PCP: Madelin HeadingsPanosh, Wanda K, MD  Patient is from: Home  DOA: 01/13/2021 LOS: 0  Chief complaints: Fever, chills and dry cough  Brief Narrative / Interim history: 70 year old F with history of anal cancer followed by Dr. Truett PernaSherrill, and recent hospitalization from 4/21-4//27 for Klebsiella pneumonia bacteremia, pyelonephritis, obstructive uropathy with right hydronephrosis for which she had right ureteral stent and discharged on IV Invanz via PICC line through 01/17/21.  She had ureteral stent removed on 4/27 and developed fever, chills and dry cough after she returned home that prompted her to come to ED.  Patient was febrile, tachycardic and tachypneic in ED.  UA with pyuria and hematuria.  CXR with some hilar opacity.  Lactic acid within normal.  EDP discussed with on-call urologist who recommended IV antibiotics.  Cultures drawn.  Admitted with working diagnosis of sepsis.   Fever curve down trended.  Sepsis physiology resolved.  Blood and urine cultures NGTD.  MRSA PCR negative.  Renal function improved.  Antibiotics discontinued.  Renal US showed moderate right-sided hydronephrosis.  Given her previous history of significant pain with stent, her urologist recommended allowing some degree of hydronephrosis unless she is clinically worse.    Subjective: Seen and examined earlier this morning.  Patient had headache last night that has improved this morning.  She has no photophobia phonophobia.  She also spiked fever to 101.3 about 7 AM.  She also felt chills.  Denies nausea, vomiting or abdominal pain.  She denies UTI symptoms.  Objective: Vitals:   01/15/21 1826 01/15/21 2138 01/16/21 0550 01/16/21 1204  BP:  (!) 152/83 (!) 141/77 126/70  Pulse:  87 80 81  Resp:  20 20 20   Temp: (!) 101.3 F (38.5 C) 99.6 F (37.6 C) 98.3 F (36.8 C) 99.3 F (37.4 C)  TempSrc: Oral Oral Oral Oral  SpO2:  96% 96% 95%    Intake/Output  Summary (Last 24 hours) at 01/16/2021 1512 Last data filed at 01/16/2021 1030 Gross per 24 hour  Intake 480 ml  Output --  Net 480 ml   There were no vitals filed for this visit.  Examination:  GENERAL: No apparent distress.  Nontoxic. HEENT: MMM.  Vision and hearing grossly intact.  NECK: Supple.  No apparent JVD.  RESP: On RA.  No IWOB.  Fair aeration bilaterally. CVS:  RRR. Heart sounds normal.  ABD/GI/GU: BS+. Abd soft, NTND.  MSK/EXT:  Moves extremities. No apparent deformity. No edema.  SKIN: no apparent skin lesion or wound NEURO: Awake, alert and oriented appropriately.  No apparent focal neuro deficit. PSYCH: Calm. Normal affect.   Procedures:  None  Microbiology summarized: COVID-19 PCR nonreactive. MRSA PCR nonreactive. Blood cultures NGTD. Urine culture NGTD  Assessment & Plan: Sepsis due to complicated UTI in patient with recent pyelonephritis, bacteremia and right hydronephrosis s/p ureteral stent and removal.  UA with pyuria and hematuria.  She had fever, tachycardia and tachypnea on presentation.  Lactic acid within normal.  Blood and urine culture NGTD.  MRSA PCR negative.  Spiked fever to 101.37F last night. -Discontinued IV vancomycin on 4/28 -CT abdomen and pelvis -Check two-view chest x-ray given ongoing cough. -Discussed with ID. Dr. Renold DonVu and her urologist, Dr. Berneice HeinrichManny  -resume P[facility] SanesInvanz pending CT abdomen pelvis  -check full RVP  History of obstructive uropathy/right hydronephrosis s/p ureteral stent which was removed on 4/27.  Renal ultrasound with moderate hydronephrosis on the right. -Management as above -Monitor urine output  and renal function  Normocytic anemia: Noted about 2.5 g drop in Hgb.  Likely dilutional from IV fluid resuscitation.  She denies melena or hematochezia.  However, she has some hematuria Recent Labs    03/27/20 1405 04/13/20 1352 06/12/20 1228 06/17/20 0906 10/28/20 0834 01/13/21 0744 01/13/21 1941 01/14/21 0415  01/15/21 0614 01/16/21 0315  HGB 12.0 12.1 13.6 12.0 11.7* 11.6* 10.3* 8.5* 8.2* 8.4*  -SCD for VTE prophylaxis -Continue monitoring  AKI: Improving.  Excellent urine output.  Renal US with moderate right hydro Recent Labs    03/13/20 1325 03/27/20 1405 06/12/20 1228 06/17/20 0906 10/28/20 0834 01/13/21 0744 01/13/21 1941 01/14/21 0415 01/15/21 0614 01/16/21 0315  BUN 14 11 14 16 17 16 19 19 19 15   CREATININE 0.80 0.77 0.60 0.70 0.77 0.90 1.16* 1.11* 0.92 1.00  -Recheck in the morning  Hypokalemia/hypomagnesemia: Hypokalemia resolved.  Mg 1.7. -IV magnesium sulfate 2 g x 1  Hyponatremia: Relatively stable. -Continue monitoring  History of anal cancer-Per last oncology note, seems to be in remission after she completed course of Xeloda and radiation on 04/13/2020.  She is followed by Dr. Benay Spice -Outpatient follow-up.  Hypothyroidism -Continue home Synthroid  Headache: Likely tension headache.  Improved this morning. -Continue as needed Tylenol and tramadol  There is no height or weight on file to calculate BMI.         DVT prophylaxis:  Place and maintain sequential compression device Start: 01/15/21 1648  Code Status: Full code Family Communication: Patient and/or RN. Available if any question.  Level of care: Med-Surg Status is: Observation  The patient will require care spanning > 2 midnights and should be moved to inpatient because: Ongoing diagnostic testing needed not appropriate for outpatient work up, IV treatments appropriate due to intensity of illness or inability to take PO and Inpatient level of care appropriate due to severity of illness  Dispo: The patient is from: Home              Anticipated d/c is to: Home              Patient currently is not medically stable to d/c.   Difficult to place patient No        Consultants:  Urology Infectious disease over the phone   Sch Meds:  Scheduled Meds: . feeding supplement  237 mL Oral BID  BM  . levothyroxine  50 mcg Oral QAC breakfast  . sodium chloride flush  10-40 mL Intracatheter Q12H   Continuous Infusions: . ertapenem     PRN Meds:.acetaminophen, polyethylene glycol, senna-docusate, sodium chloride flush, traMADol  Antimicrobials: Anti-infectives (From admission, onward)   Start     Dose/Rate Route Frequency Ordered Stop   01/16/21 1600  ertapenem (INVANZ) 1,000 mg in sodium chloride 0.9 % 100 mL IVPB        1 g 200 mL/hr over 30 Minutes Intravenous Every 24 hours 01/16/21 1503 01/19/21 1559   01/15/21 0800  vancomycin (VANCOREADY) IVPB 750 mg/150 mL  Status:  Discontinued        750 mg 150 mL/hr over 60 Minutes Intravenous Every 24 hours 01/14/21 0651 01/14/21 1235   01/14/21 1600  ertapenem (INVANZ) 1,000 mg in sodium chloride 0.9 % 100 mL IVPB  Status:  Discontinued        1 g 200 mL/hr over 30 Minutes Intravenous Every 24 hours 01/13/21 2345 01/15/21 1155   01/14/21 0745  vancomycin (VANCOREADY) IVPB 1750 mg/350 mL  1,750 mg 175 mL/hr over 120 Minutes Intravenous  Once 01/14/21 0649 01/14/21 1026   01/13/21 1915  ertapenem (INVANZ) 1,000 mg in sodium chloride 0.9 % 100 mL IVPB  Status:  Discontinued        1 g 200 mL/hr over 30 Minutes Intravenous  Once 01/13/21 1855 01/13/21 1902       I have personally reviewed the following labs and images: CBC: Recent Labs  Lab 01/13/21 0744 01/13/21 1941 01/14/21 0415 01/15/21 0614 01/16/21 0315  WBC  --  9.7 6.4 7.5 7.5  NEUTROABS  --  9.0*  --   --   --   HGB 11.6* 10.3* 8.5* 8.2* 8.4*  HCT 34.0* 32.8* 27.4* 26.5* 26.3*  MCV  --  87.0 89.5 87.7 86.8  PLT  --  235 154 175 209   BMP &GFR Recent Labs  Lab 01/13/21 0744 01/13/21 1941 01/14/21 0415 01/15/21 0614 01/16/21 0315  NA 139 134* 134* 135 134*  K 3.5 3.7 3.4* 3.8 3.8  CL 100 100 106 108 104  CO2  --  23 20* 21* 22  GLUCOSE 100* 119* 116* 106* 108*  BUN 16 19 19 19 15   CREATININE 0.90 1.16* 1.11* 0.92 1.00  CALCIUM  --  8.6* 7.6*  8.0* 8.2*  MG  --   --   --  1.7 2.0  PHOS  --   --   --  2.4* 2.2*   Estimated Creatinine Clearance: 52.3 mL/min (by C-G formula based on SCr of 1 mg/dL). Liver & Pancreas: Recent Labs  Lab 01/13/21 1941 01/15/21 0614 01/16/21 0315  AST 52*  --   --   ALT 40  --   --   ALKPHOS 121  --   --   BILITOT 0.3  --   --   PROT 7.3  --   --   ALBUMIN 3.2* 2.3* 2.2*   No results for input(s): LIPASE, AMYLASE in the last 168 hours. No results for input(s): AMMONIA in the last 168 hours. Diabetic: No results for input(s): HGBA1C in the last 72 hours. No results for input(s): GLUCAP in the last 168 hours. Cardiac Enzymes: No results for input(s): CKTOTAL, CKMB, CKMBINDEX, TROPONINI in the last 168 hours. No results for input(s): PROBNP in the last 8760 hours. Coagulation Profile: Recent Labs  Lab 01/13/21 1941  INR 1.2   Thyroid Function Tests: No results for input(s): TSH, T4TOTAL, FREET4, T3FREE, THYROIDAB in the last 72 hours. Lipid Profile: No results for input(s): CHOL, HDL, LDLCALC, TRIG, CHOLHDL, LDLDIRECT in the last 72 hours. Anemia Panel: No results for input(s): VITAMINB12, FOLATE, FERRITIN, TIBC, IRON, RETICCTPCT in the last 72 hours. Urine analysis:    Component Value Date/Time   COLORURINE YELLOW 01/13/2021 1938   APPEARANCEUR CLOUDY (A) 01/13/2021 1938   LABSPEC 1.026 01/13/2021 1938   LABSPEC 1.010 02/14/2017 1604   PHURINE 6.0 01/13/2021 1938   GLUCOSEU NEGATIVE 01/13/2021 1938   GLUCOSEU Negative 02/14/2017 1604   HGBUR LARGE (A) 01/13/2021 1938   BILIRUBINUR NEGATIVE 01/13/2021 1938   BILIRUBINUR Negative 01/08/2020 0949   BILIRUBINUR Negative 02/14/2017 1604   KETONESUR 5 (A) 01/13/2021 1938   PROTEINUR 100 (A) 01/13/2021 1938   UROBILINOGEN 0.2 01/08/2020 0949   UROBILINOGEN 0.2 02/14/2017 1604   NITRITE NEGATIVE 01/13/2021 1938   LEUKOCYTESUR LARGE (A) 01/13/2021 1938   LEUKOCYTESUR Small 02/14/2017 1604   Sepsis Labs: Invalid input(s):  PROCALCITONIN, LACTICIDVEN  Microbiology: Recent Results (from the past 240 hour(s))  Resp  Panel by RT-PCR (Flu A&B, Covid) Nasopharyngeal Swab     Status: None   Collection Time: 01/13/21  6:06 AM   Specimen: Nasopharyngeal Swab; Nasopharyngeal(NP) swabs in vial transport medium  Result Value Ref Range Status   SARS Coronavirus 2 by RT PCR NEGATIVE NEGATIVE Final    Comment: (NOTE) SARS-CoV-2 target nucleic acids are NOT DETECTED.  The SARS-CoV-2 RNA is generally detectable in upper respiratory specimens during the acute phase of infection. The lowest concentration of SARS-CoV-2 viral copies this assay can detect is 138 copies/mL. A negative result does not preclude SARS-Cov-2 infection and should not be used as the sole basis for treatment or other patient management decisions. A negative result may occur with  improper specimen collection/handling, submission of specimen other than nasopharyngeal swab, presence of viral mutation(s) within the areas targeted by this assay, and inadequate number of viral copies(<138 copies/mL). A negative result must be combined with clinical observations, patient history, and epidemiological information. The expected result is Negative.  Fact Sheet for Patients:  EntrepreneurPulse.com.au  Fact Sheet for Healthcare Providers:  IncredibleEmployment.be  This test is no t yet approved or cleared by the [place] FDA and  has been authorized for detection and/or diagnosis of SARS-CoV-2 by FDA under an Emergency Use Authorization (EUA). This EUA will remain  in effect (meaning this test can be used) for the duration of the COVID-19 declaration under Section 564(b)(1) of the Act, 21 U.S.C.section 360bbb-3(b)(1), unless the authorization is terminated  or revoked sooner.       Influenza A by PCR NEGATIVE NEGATIVE Final   Influenza B by PCR NEGATIVE NEGATIVE Final    Comment: (NOTE) The Xpert Xpress  SARS-CoV-2/FLU/RSV plus assay is intended as an aid in the diagnosis of influenza from Nasopharyngeal swab specimens and should not be used as a sole basis for treatment. Nasal washings and aspirates are unacceptable for Xpert Xpress SARS-CoV-2/FLU/RSV testing.  Fact Sheet for Patients: EntrepreneurPulse.com.au  Fact Sheet for Healthcare Providers: IncredibleEmployment.be  This test is not yet approved or cleared by the [place] FDA and has been authorized for detection and/or diagnosis of SARS-CoV-2 by FDA under an Emergency Use Authorization (EUA). This EUA will remain in effect (meaning this test can be used) for the duration of the COVID-19 declaration under Section 564(b)(1) of the Act, 21 U.S.C. section 360bbb-3(b)(1), unless the authorization is terminated or revoked.  Performed at [facility], [place] [address], [place], [place] [address]   Blood Culture (routine x 2)     Status: None (Preliminary result)   Collection Time: 01/13/21  7:20 PM   Specimen: BLOOD  Result Value Ref Range Status   Specimen Description   Final    BLOOD LEFT ANTECUBITAL Performed at [place] [address] [place], [place] [address]    Special Requests   Final    BOTTLES DRAWN AEROBIC AND ANAEROBIC Blood Culture adequate volume Performed at [place] [address], [place], [place] [address]    Culture   Final    NO GROWTH 3 DAYS Performed at [facility], [place] [address], [place], [place] [address]    Report Status PENDING  Incomplete  Blood Culture (routine x 2)     Status: None (Preliminary result)   Collection Time: 01/13/21  7:31 PM   Specimen: BLOOD  Result Value Ref Range Status   Specimen Description   Final    BLOOD RIGHT PICC LINE Performed at [facility], [place] [address] [place], [place] [address]    Special Requests   Final    BOTTLES DRAWN  AEROBIC AND ANAEROBIC Blood Culture adequate volume Performed at [place] [address] [place], [place] [address]    Culture   Final    NO GROWTH 3 DAYS Performed at [facility], [place] [address] [place], [place] [address]    Report Status PENDING  Incomplete  Urine culture     Status: None   Collection Time: 01/13/21  7:38 PM   Specimen: In/Out Cath Urine  Result Value Ref Range Status   Specimen Description   Final    IN/OUT CATH URINE Performed at [place] [address], [place], [place] [address]    Special Requests   Final    NONE Performed at [facility], [place] [address] [place], [place] [address]    Culture   Final    NO GROWTH Performed at [facility], [place] [address], [place], [place] [address]    Report Status 01/15/2021 FINAL  Final  Resp Panel by RT-PCR (Flu A&B, Covid) Nasopharyngeal Swab     Status: None   Collection Time: 01/13/21  7:41 PM   Specimen: Nasopharyngeal Swab; Nasopharyngeal(NP) swabs in vial transport medium  Result Value Ref Range Status   SARS Coronavirus 2 by RT PCR NEGATIVE NEGATIVE Final    Comment: (NOTE) SARS-CoV-2 target nucleic acids are NOT DETECTED.  The SARS-CoV-2 RNA is generally detectable in upper respiratory specimens during the acute phase of infection. The lowest concentration of SARS-CoV-2 viral copies this assay can detect is 138 copies/mL. A negative result does not preclude SARS-Cov-2 infection and should not be used as the sole basis for treatment or other patient management decisions. A negative result may occur with  improper specimen collection/handling, submission of specimen other than nasopharyngeal swab, presence of viral mutation(s) within the areas targeted by this assay, and inadequate number of viral copies(<138 copies/mL). A negative result must be combined with clinical observations, patient history, and  epidemiological information. The expected result is Negative.  Fact Sheet for Patients:  EntrepreneurPulse.com.au  Fact Sheet for Healthcare Providers:  IncredibleEmployment.be  This test is no t yet approved or cleared by the [place] FDA and  has been authorized for detection and/or diagnosis of SARS-CoV-2 by FDA under an Emergency Use Authorization (EUA). This EUA will remain  in effect (meaning this test can be used) for the duration of the COVID-19 declaration under Section 564(b)(1) of the Act, 21 U.S.C.section 360bbb-3(b)(1), unless the authorization is terminated  or revoked sooner.       Influenza A by PCR NEGATIVE NEGATIVE Final   Influenza B by PCR NEGATIVE NEGATIVE Final    Comment: (NOTE) The Xpert Xpress SARS-CoV-2/FLU/RSV plus assay is intended as an aid in the diagnosis of influenza from Nasopharyngeal swab specimens and should not be used as a sole basis for treatment. Nasal washings and aspirates are unacceptable for Xpert Xpress SARS-CoV-2/FLU/RSV testing.  Fact Sheet for Patients: EntrepreneurPulse.com.au  Fact Sheet for Healthcare Providers: IncredibleEmployment.be  This test is not yet approved or cleared by the [place] FDA and has been authorized for detection and/or diagnosis of SARS-CoV-2 by FDA under an Emergency Use Authorization (EUA). This EUA will remain in effect (meaning this test can be used) for the duration of the COVID-19 declaration under Section 564(b)(1) of the Act, 21 U.S.C. section 360bbb-3(b)(1), unless the authorization is  terminated or revoked.  Performed at [facility], [place] [address], [place], [place] [address]   MRSA PCR Screening     Status: None   Collection Time: 01/14/21  9:01 AM   Specimen: Nasal Mucosa; Nasopharyngeal  Result Value Ref Range Status   MRSA by PCR NEGATIVE NEGATIVE Final    Comment:        The  GeneXpert MRSA Assay (FDA approved for NASAL specimens only), is one component of a comprehensive MRSA colonization surveillance program. It is not intended to diagnose MRSA infection nor to guide or monitor treatment for MRSA infections. Performed at [facility], [place] [address] [place],  [address]     Radiology Studies: No results found.   Bettylou Frew T. Dowling  If 7PM-7AM, please contact night-coverage [email or web address] 01/16/2021, 3:12 PM

## 2021-01-17 DIAGNOSIS — J189 Pneumonia, unspecified organism: Secondary | ICD-10-CM

## 2021-01-17 DIAGNOSIS — R7881 Bacteremia: Secondary | ICD-10-CM | POA: Diagnosis not present

## 2021-01-17 DIAGNOSIS — A419 Sepsis, unspecified organism: Secondary | ICD-10-CM

## 2021-01-17 DIAGNOSIS — Z85048 Personal history of other malignant neoplasm of rectum, rectosigmoid junction, and anus: Secondary | ICD-10-CM

## 2021-01-17 DIAGNOSIS — G4700 Insomnia, unspecified: Secondary | ICD-10-CM | POA: Diagnosis not present

## 2021-01-17 DIAGNOSIS — D649 Anemia, unspecified: Secondary | ICD-10-CM

## 2021-01-17 DIAGNOSIS — N179 Acute kidney failure, unspecified: Secondary | ICD-10-CM | POA: Diagnosis not present

## 2021-01-17 DIAGNOSIS — R319 Hematuria, unspecified: Secondary | ICD-10-CM

## 2021-01-17 LAB — RESPIRATORY PANEL BY PCR

## 2021-01-17 LAB — RENAL FUNCTION PANEL
Albumin: 2.3 g/dL — ABNORMAL LOW (ref 3.5–5.0)
Anion gap: 6 (ref 5–15)
BUN: 14 mg/dL (ref 8–23)
CO2: 24 mmol/L (ref 22–32)
Calcium: 8.1 mg/dL — ABNORMAL LOW (ref 8.9–10.3)
Chloride: 105 mmol/L (ref 98–111)
Creatinine, Ser: 0.96 mg/dL (ref 0.44–1.00)
GFR, Estimated: >60 mL/min (ref >60)
Glucose, Bld: 103 mg/dL — ABNORMAL HIGH (ref 70–99)
Phosphorus: 3.4 mg/dL (ref 2.5–4.6)
Potassium: 3.5 mmol/L (ref 3.5–5.1)
Sodium: 135 mmol/L (ref 135–145)

## 2021-01-17 LAB — CBC
HCT: 28.9 % — ABNORMAL LOW (ref 36.0–46.0)
Hemoglobin: 9.1 g/dL — ABNORMAL LOW (ref 12.0–15.0)
MCH: 27.4 pg (ref 26.0–34.0)
MCHC: 31.5 g/dL (ref 30.0–36.0)
MCV: 87 fL (ref 80.0–100.0)
Platelets: 243 10*3/uL (ref 150–400)
RBC: 3.32 MIL/uL — ABNORMAL LOW (ref 3.87–5.11)
RDW: 15.2 % (ref 11.5–15.5)
WBC: 5.7 10*3/uL (ref 4.0–10.5)
nRBC: 0 % (ref 0.0–0.2)

## 2021-01-17 LAB — MAGNESIUM: Magnesium: 2.1 mg/dL (ref 1.7–2.4)

## 2021-01-17 LAB — PROCALCITONIN: Procalcitonin: 0.32 ng/mL

## 2021-01-17 MED ORDER — TRAZODONE HCL 50 MG PO TABS
50.0000 mg | ORAL_TABLET | Freq: Every day | ORAL | Status: DC
  Administered 2021-01-17 – 2021-01-18 (×2): 50 mg via ORAL
  Filled 2021-01-17 (×2): qty 1

## 2021-01-17 MED ORDER — LOPERAMIDE HCL 2 MG PO CAPS
2.0000 mg | ORAL_CAPSULE | ORAL | Status: DC | PRN
  Administered 2021-01-17 – 2021-01-19 (×3): 2 mg via ORAL
  Filled 2021-01-17 (×3): qty 1

## 2021-01-17 MED ORDER — AZITHROMYCIN 250 MG PO TABS
500.0000 mg | ORAL_TABLET | Freq: Every day | ORAL | Status: DC
  Administered 2021-01-17 – 2021-01-19 (×3): 500 mg via ORAL
  Filled 2021-01-17 (×3): qty 2

## 2021-01-17 MED ORDER — MENTHOL 3 MG MT LOZG
1.0000 | LOZENGE | OROMUCOSAL | Status: DC | PRN
  Administered 2021-01-17: 3 mg via ORAL
  Filled 2021-01-17: qty 9

## 2021-01-17 MED ORDER — GUAIFENESIN-DM 100-10 MG/5ML PO SYRP
5.0000 mL | ORAL_SOLUTION | ORAL | Status: DC | PRN
  Administered 2021-01-17: 5 mL via ORAL
  Filled 2021-01-17: qty 10

## 2021-01-17 NOTE — Progress Notes (Signed)
PROGRESS NOTE  TENIYA FILTER XNA:[id number] DOB: Aug 01, 1951   PCP: [name], MD  Patient is from: Home  DOA: 01/13/2021 LOS: 1  Chief complaints: Fever, chills and dry cough  Brief Narrative / Interim history: 70 year old F with history of anal cancer followed by Dr. Benay Spice, and recent hospitalization from 4/21-4//27 for Klebsiella pneumonia bacteremia, pyelonephritis, obstructive uropathy with right hydronephrosis for which she had right ureteral stent and discharged on IV Invanz via PICC line through 01/17/21.  She had ureteral stent removed on 4/27 and developed fever, chills and dry cough after she returned home that prompted her to come to ED.  Patient was febrile, tachycardic and tachypneic in ED.  UA with pyuria and hematuria.  CXR with some hilar opacity.  Lactic acid within normal.  Admitted with working diagnosis of sepsis.   Blood and urine cultures NGTD.  MRSA PCR negative.  Renal function improved.  Renal US showed moderate right-sided hydronephrosis.  Given her previous history of significant pain with stent, her urologist recommended allowing some degree of hydronephrosis unless she is clinically worse.   Patient continues to spike fever intermittently.  CT abdomen and pelvis with mild to moderate hydronephrosis, possible recurrent LAD.  2 view CXR concerning for atypical pneumonia.  Full RVP negative Continued on IV Invanz.  Also started on azithromycin for atypical pneumonia  Subjective: Seen and examined earlier this morning.  No major events overnight of this morning.  She had fever to 101 and chills last night.  Continues to have dry cough but no shortness of breath or chest pain.  She denies GI or UTI symptoms.  Objective: Vitals:   01/16/21 2145 01/17/21 0153 01/17/21 0519 01/17/21 0718  BP:   140/69   Pulse:   70   Resp:   20   Temp: (!) 101 F (38.3 C) 98.5 F (36.9 C) 98.1 F (36.7 C) 98.7 F (37.1 C)  TempSrc: Oral Oral Oral   SpO2:   96%      Intake/Output Summary (Last 24 hours) at 01/17/2021 1158 Last data filed at 01/17/2021 1000 Gross per 24 hour  Intake 820 ml  Output --  Net 820 ml   There were no vitals filed for this visit.  Examination:  GENERAL: No apparent distress.  Nontoxic. HEENT: MMM.  Vision and hearing grossly intact.  NECK: Supple.  No apparent JVD.  RESP: On RA.  No IWOB.  Fair aeration bilaterally. CVS:  RRR. Heart sounds normal.  ABD/GI/GU: BS+. Abd soft, NTND.  No suprapubic or CVA tenderness. MSK/EXT:  Moves extremities. No apparent deformity. No edema.  SKIN: no apparent skin lesion or wound NEURO: Awake, alert and oriented appropriately.  No apparent focal neuro deficit. PSYCH: Calm. Normal affect.   Procedures:  None  Microbiology summarized: COVID-19 PCR nonreactive. MRSA PCR nonreactive. Blood cultures NGTD. Urine culture NGTD  Assessment & Plan: Sepsis due to atypical pneumonia and complicated UTI in patient with recent pyelonephritis, bacteremia and right hydronephrosis s/p ureteral stent and removal.  UA with pyuria and hematuria but urine and blood cultures negative.  Renal US and CT abdomen and pelvis shows recurrent mild to moderate hydronephrosis.  CXR showing atypical pneumonia.  RVP negative.  Procalcitonin 0.32. -Continue IV Invanz-started at outside hospital on 4/21 -Start azithromycin 500 mg daily -Follow fever curve -Appreciate input by ID and urology.  History of obstructive uropathy/right hydronephrosis s/p ureteral stent which was removed on 4/27.  Renal US and CT abdomen and pelvis with mild to  moderate residual right hydronephrosis.  Urology prefers to avoid ureteral stent placement given history of intolerance -Management as above -Monitor urine output and renal function  Normocytic anemia: Noted about 2.5 g drop in Hgb.  Likely dilutional from IV fluid resuscitation.  She denies melena or hematochezia.  However, she has some hematuria Recent Labs     04/13/20 1352 06/12/20 1228 06/17/20 0906 10/28/20 0834 01/13/21 0744 01/13/21 1941 01/14/21 0415 01/15/21 0614 01/16/21 0315 01/17/21 0449  HGB 12.1 13.6 12.0 11.7* 11.6* 10.3* 8.5* 8.2* 8.4* 9.1*  -SCD for VTE prophylaxis -Continue monitoring  AKI: Improving.  Excellent urine output.  Renal US with moderate right hydro Recent Labs    03/27/20 1405 06/12/20 1228 06/17/20 0906 10/28/20 0834 01/13/21 0744 01/13/21 1941 01/14/21 0415 01/15/21 0614 01/16/21 0315 01/17/21 0449  BUN 11 14 16 17 16 19 19 19 15 14   CREATININE 0.77 0.60 0.70 0.77 0.90 1.16* 1.11* 0.92 1.00 0.96  -Recheck in the morning  Hypokalemia/hypomagnesemia: K3.5.  Mg 2.1. -K-Dur 40 mill equivalent x1  Hyponatremia: Relatively stable. -Continue monitoring  History of anal cancer-Per last oncology note, seems to be in remission after she completed course of Xeloda and radiation on 04/13/2020.  She is followed by Dr. Benay Spice -Outpatient follow-up.  Hypothyroidism -Continue home Synthroid  Headache: Likely tension headache.  Resolved. -As needed Tylenol  Insomnia -Trazodone at night.  There is no height or weight on file to calculate BMI.         DVT prophylaxis:  Place and maintain sequential compression device Start: 01/15/21 1648  Code Status: Full code Family Communication: Patient and/or RN. Available if any question.  Level of care: Med-Surg  Status is: Inpatient  Remains inpatient appropriate because:IV treatments appropriate due to intensity of illness or inability to take PO and Inpatient level of care appropriate due to severity of illness   Dispo: The patient is from: Home              Anticipated d/c is to: Home              Patient currently is not medically stable to d/c.   Difficult to place patient No             Consultants:  Urology Infectious disease over the phone   Sch Meds:  Scheduled Meds: . azithromycin  500 mg Oral Daily  . feeding  supplement  237 mL Oral BID BM  . levothyroxine  50 mcg Oral QAC breakfast  . sodium chloride flush  10-40 mL Intracatheter Q12H   Continuous Infusions: . ertapenem 1,000 mg (01/16/21 1719)   PRN Meds:.acetaminophen, polyethylene glycol, senna-docusate, sodium chloride flush, traMADol  Antimicrobials: Anti-infectives (From admission, onward)   Start     Dose/Rate Route Frequency Ordered Stop   01/17/21 0900  azithromycin (ZITHROMAX) tablet 500 mg        500 mg Oral Daily 01/17/21 0739 01/22/21 0959   01/16/21 1600  ertapenem (INVANZ) 1,000 mg in sodium chloride 0.9 % 100 mL IVPB        1 g 200 mL/hr over 30 Minutes Intravenous Every 24 hours 01/16/21 1503 01/19/21 1559   01/15/21 0800  vancomycin (VANCOREADY) IVPB 750 mg/150 mL  Status:  Discontinued        750 mg 150 mL/hr over 60 Minutes Intravenous Every 24 hours 01/14/21 0651 01/14/21 1235   01/14/21 1600  ertapenem (INVANZ) 1,000 mg in sodium chloride 0.9 % 100 mL IVPB  Status:  Discontinued  1 g 200 mL/hr over 30 Minutes Intravenous Every 24 hours 01/13/21 2345 01/15/21 1155   01/14/21 0745  vancomycin (VANCOREADY) IVPB 1750 mg/350 mL        1,750 mg 175 mL/hr over 120 Minutes Intravenous  Once 01/14/21 0649 01/14/21 1026   01/13/21 1915  ertapenem (INVANZ) 1,000 mg in sodium chloride 0.9 % 100 mL IVPB  Status:  Discontinued        1 g 200 mL/hr over 30 Minutes Intravenous  Once 01/13/21 1855 01/13/21 1902       I have personally reviewed the following labs and images: CBC: Recent Labs  Lab 01/13/21 1941 01/14/21 0415 01/15/21 0614 01/16/21 0315 01/17/21 0449  WBC 9.7 6.4 7.5 7.5 5.7  NEUTROABS 9.0*  --   --   --   --   HGB 10.3* 8.5* 8.2* 8.4* 9.1*  HCT 32.8* 27.4* 26.5* 26.3* 28.9*  MCV 87.0 89.5 87.7 86.8 87.0  PLT 235 154 175 209 243   BMP &GFR Recent Labs  Lab 01/13/21 1941 01/14/21 0415 01/15/21 0614 01/16/21 0315 01/17/21 0449  NA 134* 134* 135 134* 135  K 3.7 3.4* 3.8 3.8 3.5  CL 100 106  108 104 105  CO2 23 20* 21* 22 24  GLUCOSE 119* 116* 106* 108* 103*  BUN 19 19 19 15 14   CREATININE 1.16* 1.11* 0.92 1.00 0.96  CALCIUM 8.6* 7.6* 8.0* 8.2* 8.1*  MG  --   --  1.7 2.0 2.1  PHOS  --   --  2.4* 2.2* 3.4   Estimated Creatinine Clearance: 54.5 mL/min (by C-G formula based on SCr of 0.96 mg/dL). Liver & Pancreas: Recent Labs  Lab 01/13/21 1941 01/15/21 0614 01/16/21 0315 01/17/21 0449  AST 52*  --   --   --   ALT 40  --   --   --   ALKPHOS 121  --   --   --   BILITOT 0.3  --   --   --   PROT 7.3  --   --   --   ALBUMIN 3.2* 2.3* 2.2* 2.3*   No results for input(s): LIPASE, AMYLASE in the last 168 hours. No results for input(s): AMMONIA in the last 168 hours. Diabetic: No results for input(s): HGBA1C in the last 72 hours. No results for input(s): GLUCAP in the last 168 hours. Cardiac Enzymes: No results for input(s): CKTOTAL, CKMB, CKMBINDEX, TROPONINI in the last 168 hours. No results for input(s): PROBNP in the last 8760 hours. Coagulation Profile: Recent Labs  Lab 01/13/21 1941  INR 1.2   Thyroid Function Tests: No results for input(s): TSH, T4TOTAL, FREET4, T3FREE, THYROIDAB in the last 72 hours. Lipid Profile: No results for input(s): CHOL, HDL, LDLCALC, TRIG, CHOLHDL, LDLDIRECT in the last 72 hours. Anemia Panel: No results for input(s): VITAMINB12, FOLATE, FERRITIN, TIBC, IRON, RETICCTPCT in the last 72 hours. Urine analysis:    Component Value Date/Time   COLORURINE YELLOW 01/13/2021 1938   APPEARANCEUR CLOUDY (A) 01/13/2021 1938   LABSPEC 1.026 01/13/2021 1938   LABSPEC 1.010 02/14/2017 1604   PHURINE 6.0 01/13/2021 1938   GLUCOSEU NEGATIVE 01/13/2021 1938   GLUCOSEU Negative 02/14/2017 1604   HGBUR LARGE (A) 01/13/2021 1938   BILIRUBINUR NEGATIVE 01/13/2021 1938   BILIRUBINUR Negative 01/08/2020 0949   BILIRUBINUR Negative 02/14/2017 1604   KETONESUR 5 (A) 01/13/2021 1938   PROTEINUR 100 (A) 01/13/2021 1938   UROBILINOGEN 0.2 01/08/2020  0949   UROBILINOGEN 0.2 02/14/2017 1604  NITRITE NEGATIVE 01/13/2021 1938   LEUKOCYTESUR LARGE (A) 01/13/2021 1938   LEUKOCYTESUR Small 02/14/2017 1604   Sepsis Labs: Invalid input(s): PROCALCITONIN, [place]  Microbiology: Recent Results (from the past 240 hour(s))  Resp Panel by RT-PCR (Flu A&B, Covid) Nasopharyngeal Swab     Status: None   Collection Time: 01/13/21  6:06 AM   Specimen: Nasopharyngeal Swab; Nasopharyngeal(NP) swabs in vial transport medium  Result Value Ref Range Status   SARS Coronavirus 2 by RT PCR NEGATIVE NEGATIVE Final    Comment: (NOTE) SARS-CoV-2 target nucleic acids are NOT DETECTED.  The SARS-CoV-2 RNA is generally detectable in upper respiratory specimens during the acute phase of infection. The lowest concentration of SARS-CoV-2 viral copies this assay can detect is 138 copies/mL. A negative result does not preclude SARS-Cov-2 infection and should not be used as the sole basis for treatment or other patient management decisions. A negative result may occur with  improper specimen collection/handling, submission of specimen other than nasopharyngeal swab, presence of viral mutation(s) within the areas targeted by this assay, and inadequate number of viral copies(<138 copies/mL). A negative result must be combined with clinical observations, patient history, and epidemiological information. The expected result is Negative.  Fact Sheet for Patients:  EntrepreneurPulse.com.au  Fact Sheet for Healthcare Providers:  IncredibleEmployment.be  This test is no t yet approved or cleared by the [place] FDA and  has been authorized for detection and/or diagnosis of SARS-CoV-2 by FDA under an Emergency Use Authorization (EUA). This EUA will remain  in effect (meaning this test can be used) for the duration of the COVID-19 declaration under Section 564(b)(1) of the Act, 21 U.S.C.section 360bbb-3(b)(1), unless the  authorization is terminated  or revoked sooner.       Influenza A by PCR NEGATIVE NEGATIVE Final   Influenza B by PCR NEGATIVE NEGATIVE Final    Comment: (NOTE) The Xpert Xpress SARS-CoV-2/FLU/RSV plus assay is intended as an aid in the diagnosis of influenza from Nasopharyngeal swab specimens and should not be used as a sole basis for treatment. Nasal washings and aspirates are unacceptable for Xpert Xpress SARS-CoV-2/FLU/RSV testing.  Fact Sheet for Patients: EntrepreneurPulse.com.au  Fact Sheet for Healthcare Providers: IncredibleEmployment.be  This test is not yet approved or cleared by the [place] FDA and has been authorized for detection and/or diagnosis of SARS-CoV-2 by FDA under an Emergency Use Authorization (EUA). This EUA will remain in effect (meaning this test can be used) for the duration of the COVID-19 declaration under Section 564(b)(1) of the Act, 21 U.S.C. section 360bbb-3(b)(1), unless the authorization is terminated or revoked.  Performed at [facility], [place] [address], [place], [place] [address]   Blood Culture (routine x 2)     Status: None (Preliminary result)   Collection Time: 01/13/21  7:20 PM   Specimen: BLOOD  Result Value Ref Range Status   Specimen Description   Final    BLOOD LEFT ANTECUBITAL Performed at [place] [address], [place], [place] [address]    Special Requests   Final    BOTTLES DRAWN AEROBIC AND ANAEROBIC Blood Culture adequate volume Performed at [place] [address], [place], [place] [address]    Culture   Final    NO GROWTH 3 DAYS Performed at [facility], [place] [address] [place], [place] [address]    Report Status PENDING  Incomplete  Blood Culture (routine x 2)     Status: None (Preliminary result)  Collection Time: 01/13/21  7:31 PM   Specimen: BLOOD  Result Value Ref Range Status   Specimen  Description   Final    BLOOD RIGHT PICC LINE Performed at [facility], [place] [address] [place], [place] [address]    Special Requests   Final    BOTTLES DRAWN AEROBIC AND ANAEROBIC Blood Culture adequate volume Performed at [place] [address], [place], [place] [address]    Culture   Final    NO GROWTH 3 DAYS Performed at [facility], [place] [address] [place], [place] [address]    Report Status PENDING  Incomplete  Urine culture     Status: None   Collection Time: 01/13/21  7:38 PM   Specimen: In/Out Cath Urine  Result Value Ref Range Status   Specimen Description   Final    IN/OUT CATH URINE Performed at [place] [address], [place], [place] [address]    Special Requests   Final    NONE Performed at [facility], [place] [address] [place], [place] [address]    Culture   Final    NO GROWTH Performed at [facility], [place] [address], [place],  [address]    Report Status 01/15/2021 FINAL  Final  Resp Panel by RT-PCR (Flu A&B, Covid) Nasopharyngeal Swab     Status: None   Collection Time: 01/13/21  7:41 PM   Specimen: Nasopharyngeal Swab; Nasopharyngeal(NP) swabs in vial transport medium  Result Value Ref Range Status   SARS Coronavirus 2 by RT PCR NEGATIVE NEGATIVE Final    Comment: (NOTE) SARS-CoV-2 target nucleic acids are NOT DETECTED.  The SARS-CoV-2 RNA is generally detectable in upper respiratory specimens during the acute phase of infection. The lowest concentration of SARS-CoV-2 viral copies this assay can detect is 138 copies/mL. A negative result does not preclude SARS-Cov-2 infection and should not be used as the sole basis for treatment or other patient management decisions. A negative result may occur with  improper specimen collection/handling, submission of specimen other than nasopharyngeal swab, presence of viral mutation(s) within the areas  targeted by this assay, and inadequate number of viral copies(<138 copies/mL). A negative result must be combined with clinical observations, patient history, and epidemiological information. The expected result is Negative.  Fact Sheet for Patients:  EntrepreneurPulse.com.au  Fact Sheet for Healthcare Providers:  IncredibleEmployment.be  This test is no t yet approved or cleared by the [place] FDA and  has been authorized for detection and/or diagnosis of SARS-CoV-2 by FDA under an Emergency Use Authorization (EUA). This EUA will remain  in effect (meaning this test can be used) for the duration of the COVID-19 declaration under Section 564(b)(1) of the Act, 21 U.S.C.section 360bbb-3(b)(1), unless the authorization is terminated  or revoked sooner.       Influenza A by PCR NEGATIVE NEGATIVE Final   Influenza B by PCR NEGATIVE NEGATIVE Final    Comment: (NOTE) The Xpert Xpress SARS-CoV-2/FLU/RSV plus assay is intended as an aid in the diagnosis of influenza from Nasopharyngeal swab specimens and should not be used as a sole basis for treatment. Nasal washings and aspirates are unacceptable for Xpert Xpress SARS-CoV-2/FLU/RSV testing.  Fact Sheet for Patients: EntrepreneurPulse.com.au  Fact Sheet for Healthcare Providers: IncredibleEmployment.be  This test is not yet approved or cleared by the [place] FDA and has been authorized for detection and/or diagnosis of SARS-CoV-2 by FDA under an Emergency Use Authorization (  EUA). This EUA will remain in effect (meaning this test can be used) for the duration of the COVID-19 declaration under Section 564(b)(1) of the Act, 21 U.S.C. section 360bbb-3(b)(1), unless the authorization is terminated or revoked.  Performed at [facility], [place] [address], [place], [place] [address]   MRSA PCR Screening     Status: None   Collection  Time: 01/14/21  9:01 AM   Specimen: Nasal Mucosa; Nasopharyngeal  Result Value Ref Range Status   MRSA by PCR NEGATIVE NEGATIVE Final    Comment:        The GeneXpert MRSA Assay (FDA approved for NASAL specimens only), is one component of a comprehensive MRSA colonization surveillance program. It is not intended to diagnose MRSA infection nor to guide or monitor treatment for MRSA infections. Performed at [facility], [place] [address] [place], [place] [address]   Respiratory (~20 pathogens) panel by PCR     Status: None   Collection Time: 01/16/21  5:20 PM   Specimen: Nasopharyngeal Swab; Respiratory  Result Value Ref Range Status   Adenovirus NOT DETECTED NOT DETECTED Final   Coronavirus 229E NOT DETECTED NOT DETECTED Final    Comment: (NOTE) The Coronavirus on the Respiratory Panel, DOES NOT test for the novel  Coronavirus (2019 nCoV)    Coronavirus HKU1 NOT DETECTED NOT DETECTED Final   Coronavirus NL63 NOT DETECTED NOT DETECTED Final   Coronavirus OC43 NOT DETECTED NOT DETECTED Final   Metapneumovirus NOT DETECTED NOT DETECTED Final   Rhinovirus / Enterovirus NOT DETECTED NOT DETECTED Final   Influenza A NOT DETECTED NOT DETECTED Final   Influenza B NOT DETECTED NOT DETECTED Final   Parainfluenza Virus 1 NOT DETECTED NOT DETECTED Final   Parainfluenza Virus 2 NOT DETECTED NOT DETECTED Final   Parainfluenza Virus 3 NOT DETECTED NOT DETECTED Final   Parainfluenza Virus 4 NOT DETECTED NOT DETECTED Final   Respiratory Syncytial Virus NOT DETECTED NOT DETECTED Final   Bordetella pertussis NOT DETECTED NOT DETECTED Final   Bordetella Parapertussis NOT DETECTED NOT DETECTED Final   Chlamydophila pneumoniae NOT DETECTED NOT DETECTED Final   Mycoplasma pneumoniae NOT DETECTED NOT DETECTED Final    Comment: Performed at [facility], [place]. [address] [place], [place] [address]    Radiology Studies: DG Chest 2 View  Result Date: 01/16/2021 CLINICAL  DATA:  Tachypnea, cough, fever.  History of anal cancer. EXAM: CHEST - 2 VIEW COMPARISON:  Chest x-ray dated 01/13/2021. FINDINGS: Increasing prominence of the interstitial markings throughout both lungs. No pleural effusion or pneumothorax is seen. Heart size and mediastinal contours are stable. Suspected chronic pulmonary artery hypertension. Sclerotic changes at the RIGHT humeral head, suggesting old trauma or avascular necrosis. No acute-appearing osseous abnormality. IMPRESSION: Increasing prominence of the interstitial markings throughout both lungs, compatible with atypical pneumonia or acute interstitial edema due to CHF/volume overload. Electronically Signed   By: Franki Cabot M.D.   On: 01/16/2021 17:28   CT ABDOMEN PELVIS W CONTRAST  Result Date: 01/16/2021 CLINICAL DATA:  Recent hospitalization for bacteremia, history of ureteral stent removal with subsequent development of fever and chills, history of anal cancer EXAM: CT ABDOMEN AND PELVIS WITH CONTRAST TECHNIQUE: Multidetector CT imaging of the abdomen and pelvis was performed using the standard protocol following bolus administration of intravenous contrast. CONTRAST:  153mL OMNIPAQUE IOHEXOL 300 MG/ML  SOLN COMPARISON:  10/28/2020 FINDINGS: Lower chest: There are small bilateral pleural effusions, right greater than left. Minimal dependent consolidation right  lower lobe consistent with atelectasis. Hepatobiliary: No focal liver abnormality is seen. No gallstones, gallbladder wall thickening, or biliary dilatation. Pancreas: Unremarkable. No pancreatic ductal dilatation or surrounding inflammatory changes. Spleen: Normal in size without focal abnormality. Adrenals/Urinary Tract: There has been interval removal of the right ureteral stent, with development of mild to moderate right-sided hydronephrosis and proximal right hydroureter. Delayed enhancement of the right kidney may reflect sequela of obstruction or underlying infection. The left  kidney enhances normally.  No urinary tract calculi. The adrenals are unremarkable. Bladder is decompressed, with diffuse nonspecific bladder wall thickening. Gas in the bladder lumen may be from recent catheterization. Stomach/Bowel: No bowel obstruction or ileus. Stable circumferential rectal wall thickening which may be related to previous radiation therapy. No acute inflammatory changes. Vascular/Lymphatic: Minimal atherosclerosis of the aorta again noted. Marked ectasia of the bilateral common iliac arteries unchanged. There is new soft tissue density interposed between the IVC and right psoas muscle on image 38/2, measuring approximately 2.3 x 1.5 cm. Lymphadenopathy cannot be excluded. This is at the level of apparent obstruction of the right ureter. Reproductive: Marked uterine atrophy again noted. No adnexal masses. Other: No free fluid or free gas.  No abdominal wall hernia. Musculoskeletal: Subcutaneous edema is seen within the bilateral flanks and lower anterior abdominal wall. There are no acute or destructive bony lesions. Reconstructed images demonstrate no additional findings. IMPRESSION: 1. Recurrent right-sided hydronephrosis and proximal right hydroureter after removal of right ureteral stent. Delayed enhancement of the right kidney may reflect sequela of obstruction versus underlying infection. 2. New soft tissue nodule along the course of the right ureter, interposed between the right psoas muscle and IVC, suspicious for recurrent lymphadenopathy. 3. Stable circumferential rectal wall thickening, likely sequela of previous radiation therapy. 4. Stable nonspecific bladder wall thickening given limited distention of the bladder. This may also be related to previous radiation therapy. Electronically Signed   By: Randa Ngo M.D.   On: 01/16/2021 17:15     Ved Martos T. Franklin  If 7PM-7AM, please contact night-coverage [email or web address] 01/17/2021, 11:58 AM

## 2021-01-17 NOTE — Progress Notes (Signed)
Urology Progress Note   Subjective: NAEON Continues to have low grade temperatures  Objective: Vital signs in last 24 hours: Temp:  [98.1 F (36.7 C)-101 F (38.3 C)] 98.1 F (36.7 C) (05/01 0519) Pulse Rate:  [70-106] 70 (05/01 0519) Resp:  [20] 20 (05/01 0519) BP: (126-154)/(69-83) 140/69 (05/01 0519) SpO2:  [95 %-96 %] 96 % (05/01 0519)  Intake/Output from previous day: 04/30 0701 - 05/01 0700 In: 820 [P.O.:720; IV Piggyback:100] Out: -  Intake/Output this shift: No intake/output data recorded.  Physical Exam:  General: Alert and oriented CV: Regular rate Lungs: No increased work of breathing Abdomen:  Soft, appropriately tender. No CVA tenderness GU: voiding spontaneously Ext: NT, No erythema  Lab Results: Recent Labs    01/15/21 0614 01/16/21 0315 01/17/21 0449  HGB 8.2* 8.4* 9.1*  HCT 26.5* 26.3* 28.9*   Recent Labs    01/16/21 0315 01/17/21 0449  NA 134* 135  K 3.8 3.5  CL 104 105  CO2 22 24  GLUCOSE 108* 103*  BUN 15 14  CREATININE 1.00 0.96  CALCIUM 8.2* 8.1*    Studies/Results: DG Chest 2 View  Result Date: 01/16/2021 CLINICAL DATA:  Tachypnea, cough, fever.  History of anal cancer. EXAM: CHEST - 2 VIEW COMPARISON:  Chest x-ray dated 01/13/2021. FINDINGS: Increasing prominence of the interstitial markings throughout both lungs. No pleural effusion or pneumothorax is seen. Heart size and mediastinal contours are stable. Suspected chronic pulmonary artery hypertension. Sclerotic changes at the RIGHT humeral head, suggesting old trauma or avascular necrosis. No acute-appearing osseous abnormality. IMPRESSION: Increasing prominence of the interstitial markings throughout both lungs, compatible with atypical pneumonia or acute interstitial edema due to CHF/volume overload. Electronically Signed   By: Franki Cabot M.D.   On: 01/16/2021 17:28   CT ABDOMEN PELVIS W CONTRAST  Result Date: 01/16/2021 CLINICAL DATA:  Recent hospitalization for bacteremia,  history of ureteral stent removal with subsequent development of fever and chills, history of anal cancer EXAM: CT ABDOMEN AND PELVIS WITH CONTRAST TECHNIQUE: Multidetector CT imaging of the abdomen and pelvis was performed using the standard protocol following bolus administration of intravenous contrast. CONTRAST:  17mL OMNIPAQUE IOHEXOL 300 MG/ML  SOLN COMPARISON:  10/28/2020 FINDINGS: Lower chest: There are small bilateral pleural effusions, right greater than left. Minimal dependent consolidation right lower lobe consistent with atelectasis. Hepatobiliary: No focal liver abnormality is seen. No gallstones, gallbladder wall thickening, or biliary dilatation. Pancreas: Unremarkable. No pancreatic ductal dilatation or surrounding inflammatory changes. Spleen: Normal in size without focal abnormality. Adrenals/Urinary Tract: There has been interval removal of the right ureteral stent, with development of mild to moderate right-sided hydronephrosis and proximal right hydroureter. Delayed enhancement of the right kidney may reflect sequela of obstruction or underlying infection. The left kidney enhances normally.  No urinary tract calculi. The adrenals are unremarkable. Bladder is decompressed, with diffuse nonspecific bladder wall thickening. Gas in the bladder lumen may be from recent catheterization. Stomach/Bowel: No bowel obstruction or ileus. Stable circumferential rectal wall thickening which may be related to previous radiation therapy. No acute inflammatory changes. Vascular/Lymphatic: Minimal atherosclerosis of the aorta again noted. Marked ectasia of the bilateral common iliac arteries unchanged. There is new soft tissue density interposed between the IVC and right psoas muscle on image 38/2, measuring approximately 2.3 x 1.5 cm. Lymphadenopathy cannot be excluded. This is at the level of apparent obstruction of the right ureter. Reproductive: Marked uterine atrophy again noted. No adnexal masses. Other:  No free fluid or free gas.  No abdominal wall hernia. Musculoskeletal: Subcutaneous edema is seen within the bilateral flanks and lower anterior abdominal wall. There are no acute or destructive bony lesions. Reconstructed images demonstrate no additional findings. IMPRESSION: 1. Recurrent right-sided hydronephrosis and proximal right hydroureter after removal of right ureteral stent. Delayed enhancement of the right kidney may reflect sequela of obstruction versus underlying infection. 2. New soft tissue nodule along the course of the right ureter, interposed between the right psoas muscle and IVC, suspicious for recurrent lymphadenopathy. 3. Stable circumferential rectal wall thickening, likely sequela of previous radiation therapy. 4. Stable nonspecific bladder wall thickening given limited distention of the bladder. This may also be related to previous radiation therapy. Electronically Signed   By: Randa Ngo M.D.   On: 01/16/2021 17:15    Assessment/Plan:  70 y.o. female with history of advanced anal ca s/p surgery/chemo/XRT with retroperitoneal adenopathy, malignant R ureteral obstruction 2021 managed with indwelling stent which was removed 4/27, she presented to ED the following day with fevers chills concerning for UTI.  Patient's labs have improved overall with supportive treatment, mild AKI resolved, and patient has been feeling better since admission, CT imaging with slowly draining kidney and mild hydronephrosis on the right but overall reassuring. Cultures have returned negative in the setting of recent IV antibiotics. Even though she has continued to have low grade temperatures, this is likely due to cycling from pyelonephritis and we would still like to avoid placement of ureteral stent if possible given that she did not tolerate the stent preivously. Will defer to ID regarding antibiotic management.   Discussed with Dr.Manny [facility]   LOS: 1 day

## 2021-01-18 DIAGNOSIS — A419 Sepsis, unspecified organism: Secondary | ICD-10-CM | POA: Diagnosis not present

## 2021-01-18 DIAGNOSIS — N179 Acute kidney failure, unspecified: Secondary | ICD-10-CM | POA: Diagnosis not present

## 2021-01-18 DIAGNOSIS — Z85048 Personal history of other malignant neoplasm of rectum, rectosigmoid junction, and anus: Secondary | ICD-10-CM | POA: Diagnosis not present

## 2021-01-18 DIAGNOSIS — G4700 Insomnia, unspecified: Secondary | ICD-10-CM | POA: Diagnosis not present

## 2021-01-18 LAB — RENAL FUNCTION PANEL
Albumin: 2.4 g/dL — ABNORMAL LOW (ref 3.5–5.0)
Anion gap: 10 (ref 5–15)
BUN: 12 mg/dL (ref 8–23)
CO2: 25 mmol/L (ref 22–32)
Calcium: 8.4 mg/dL — ABNORMAL LOW (ref 8.9–10.3)
Chloride: 105 mmol/L (ref 98–111)
Creatinine, Ser: 0.96 mg/dL (ref 0.44–1.00)
GFR, Estimated: >60 mL/min (ref >60)
Glucose, Bld: 106 mg/dL — ABNORMAL HIGH (ref 70–99)
Phosphorus: 3.1 mg/dL (ref 2.5–4.6)
Potassium: 3.4 mmol/L — ABNORMAL LOW (ref 3.5–5.1)
Sodium: 140 mmol/L (ref 135–145)

## 2021-01-18 LAB — CULTURE, BLOOD (ROUTINE X 2)
Culture: NO GROWTH
Culture: NO GROWTH
Special Requests: ADEQUATE
Special Requests: ADEQUATE

## 2021-01-18 LAB — CBC
HCT: 29.8 % — ABNORMAL LOW (ref 36.0–46.0)
Hemoglobin: 9.4 g/dL — ABNORMAL LOW (ref 12.0–15.0)
MCH: 27 pg (ref 26.0–34.0)
MCHC: 31.5 g/dL (ref 30.0–36.0)
MCV: 85.6 fL (ref 80.0–100.0)
Platelets: 270 10*3/uL (ref 150–400)
RBC: 3.48 MIL/uL — ABNORMAL LOW (ref 3.87–5.11)
RDW: 15.2 % (ref 11.5–15.5)
WBC: 5.5 10*3/uL (ref 4.0–10.5)
nRBC: 0 % (ref 0.0–0.2)

## 2021-01-18 LAB — PROCALCITONIN: Procalcitonin: 0.23 ng/mL

## 2021-01-18 LAB — MAGNESIUM: Magnesium: 2.3 mg/dL (ref 1.7–2.4)

## 2021-01-18 MED ORDER — ENSURE ENLIVE PO LIQD
237.0000 mL | Freq: Two times a day (BID) | ORAL | Status: DC
  Administered 2021-01-18 – 2021-01-19 (×2): 237 mL via ORAL

## 2021-01-18 MED ORDER — POTASSIUM CHLORIDE CRYS ER 20 MEQ PO TBCR
40.0000 meq | EXTENDED_RELEASE_TABLET | ORAL | Status: AC
  Administered 2021-01-18 (×2): 40 meq via ORAL
  Filled 2021-01-18 (×2): qty 2

## 2021-01-18 MED ORDER — ADULT MULTIVITAMIN W/MINERALS CH
1.0000 | ORAL_TABLET | Freq: Every day | ORAL | Status: DC
  Administered 2021-01-18 – 2021-01-19 (×2): 1 via ORAL
  Filled 2021-01-18 (×2): qty 1

## 2021-01-18 NOTE — Progress Notes (Signed)
Initial Nutrition Assessment  DOCUMENTATION CODES:  Not applicable  INTERVENTION:  Continue current diet order.  Add Ensure Enlive po BID, each supplement provides 350 kcal and 20 grams of protein.  Add MVI with minerals daily.  NUTRITION DIAGNOSIS:  Increased nutrient needs related to acute illness as evidenced by estimated needs.  GOAL:  Patient will meet greater than or equal to 90% of their needs  MONITOR:  PO intake,Supplement acceptance,Labs,Weight trends,Skin,I & O's  REASON FOR ASSESSMENT:  Malnutrition Screening Tool    ASSESSMENT:  70 yo female with a PMH of anal cancer and recent hospitalization from 4/21-4/27 for Klebsiella pneumonia bacteremia, pyelonephritis, obstructive uropathy with right hydronephrosis for which she had right ureteral stent and discharged on IV Invanz via PICC line through 01/17/21. She had ureteral stent removed on 4/27 and developed fever, chills and dry cough after she returned home. Presents with sepsis d/t atypical PNA and complicated UTI. 1/61 - diet advanced to regular  Pt receiving shower at time of RD visit. Unable to perform pt interview. Few meals documented in Epic, however, pt eating 50-100%, most 90-100%, of meals.  Per Epic, slight weight increase from last month, which was relatively stable since 02/2020.  Recommend adding Ensure BID to promote protein and calorie intake and MVI with minerals daily.  Medications: Synthroid, azithromycin, Invanz Labs: reviewed; K 3.4, Glucose 106, serum Ca 8.4  NUTRITION - FOCUSED PHYSICAL EXAM: Unable to perform at this time  Diet Order:   Diet Order            Diet regular Room service appropriate? Yes; Fluid consistency: Thin  Diet effective now                EDUCATION NEEDS:  Education needs have been addressed  Skin:  Skin Assessment: Skin Integrity Issues: Skin Integrity Issues:: Incisions Incisions: Perineum, closed  Last BM:  01/17/21 - Type 7, medium amount  Height:  Ht  Readings from Last 1 Encounters:  01/13/21 5\' 2"  (1.575 m)   Weight:  Wt Readings from Last 1 Encounters:  01/13/21 85.4 kg   Ideal Body Weight:  50 kg  BMI: 34.44 kg/m^2  Estimated Nutritional Needs:  Kcal:  1800-2000 Protein:  90-105 grams Fluid:  >1.8 L  Derrel Nip, RD, LDN Registered Dietitian After Hours/Weekend Pager # in [place]

## 2021-01-18 NOTE — Progress Notes (Signed)
PROGRESS NOTE  Ashley Moore JIR:[id number] DOB: [birth year]   PCP: [name], MD  Patient is from: Home  DOA: 01/13/2021 LOS: 2  Chief complaints: Fever, chills and dry cough  Brief Narrative / Interim history: 70 year old F with history of anal cancer followed by Dr. Benay Spice, and recent hospitalization from 4/21-4//27 for Klebsiella pneumonia bacteremia, pyelonephritis, obstructive uropathy with right hydronephrosis for which she had right ureteral stent and discharged on IV Invanz via PICC line through 01/17/21.  She had ureteral stent removed on 4/27 and developed fever, chills and dry cough after she returned home that prompted her to come to ED.  Patient was febrile, tachycardic and tachypneic in ED.  UA with pyuria and hematuria.  CXR with some hilar opacity.  Lactic acid within normal.  Admitted with working diagnosis of sepsis.   Blood and urine cultures NGTD.  MRSA PCR negative.  Renal function improved.  Renal US showed moderate right-sided hydronephrosis.  Given her previous history of significant pain with stent, her urologist recommended allowing some degree of hydronephrosis unless she is clinically worse.   Patient continues to spike fever intermittently.  CT abdomen and pelvis with mild to moderate hydronephrosis, possible recurrent LAD.  2 view CXR concerning for atypical pneumonia.  Full RVP negative Continued on IV Invanz.  Also started on azithromycin for atypical pneumonia on 5/1.  Subjective: Seen and examined earlier this morning.  No major events overnight of this morning.  Had good sleep last night.  Feels better today.  Cough improved.  Diarrhea improved as well.  Denies chest pain, shortness of breath, nausea, vomiting, abdominal pain or UTI symptoms.  Objective: Vitals:   01/17/21 0718 01/17/21 1202 01/17/21 2110 01/18/21 0459  BP:  128/71 (!) 153/88 140/79  Pulse:  89 82 72  Resp:    18  Temp: 98.7 F (37.1 C) 98.8 F (37.1 C) 99.3 F (37.4 C)  98.9 F (37.2 C)  TempSrc:  Oral Oral Oral  SpO2:  100% 97% 95%    Intake/Output Summary (Last 24 hours) at 01/18/2021 1242 Last data filed at 01/18/2021 0900 Gross per 24 hour  Intake 240 ml  Output --  Net 240 ml   There were no vitals filed for this visit.  Examination:  GENERAL: No apparent distress.  Nontoxic. HEENT: MMM.  Vision and hearing grossly intact.  NECK: Supple.  No apparent JVD.  RESP: On RA.  No IWOB.  Fair aeration bilaterally. CVS:  RRR. Heart sounds normal.  ABD/GI/GU: BS+. Abd soft, NTND.  MSK/EXT:  Moves extremities. No apparent deformity. No edema.  SKIN: no apparent skin lesion or wound NEURO: Awake, alert and oriented appropriately.  No apparent focal neuro deficit. PSYCH: Calm. Normal affect.  Procedures:  None  Microbiology summarized: COVID-19 PCR nonreactive. MRSA PCR nonreactive. Blood cultures NGTD. Urine culture NGTD  Assessment & Plan: Sepsis due to atypical pneumonia and complicated UTI in patient with recent pyelonephritis, bacteremia and right hydronephrosis s/p ureteral stent and removal.  UA with pyuria and hematuria but urine and blood cultures negative.  Renal US and CT abdomen and pelvis shows recurrent mild to moderate hydronephrosis.  CXR showing atypical pneumonia.  RVP negative.  Procalcitonin 0.32> 0.23. -Continue IV Invanz-started at outside hospital on 4/21.  We will run it through 5/3. -Azithromycin 500 mg daily 5/1>>  -Follow fever curve -Appreciate input by ID and urology. -Can be discharged on 5/3 if she remains afebrile  History of obstructive uropathy/right hydronephrosis s/p ureteral stent which  was removed on 4/27.  Renal US and CT abdomen and pelvis with mild to moderate residual right hydronephrosis.  Urology prefers to avoid ureteral stent placement given history of intolerance -Management as above -Monitor urine output and renal function  Normocytic anemia: Noted about 2.5 g drop in Hgb.  Likely dilutional from  IV fluid resuscitation.  She denies melena or hematochezia.  However, she has some hematuria Recent Labs    06/12/20 1228 06/17/20 0906 10/28/20 0834 01/13/21 0744 01/13/21 1941 01/14/21 0415 01/15/21 0614 01/16/21 0315 01/17/21 0449 01/18/21 0420  HGB 13.6 12.0 11.7* 11.6* 10.3* 8.5* 8.2* 8.4* 9.1* 9.4*  -Continue monitoring  AKI: Improving.  Excellent urine output.  Renal US with moderate right hydro Recent Labs    06/12/20 1228 06/17/20 0906 10/28/20 0834 01/13/21 0744 01/13/21 1941 01/14/21 0415 01/15/21 0614 01/16/21 0315 01/17/21 0449 01/18/21 0420  BUN 14 16 17 16 19 19 19 15 14 12   CREATININE 0.60 0.70 0.77 0.90 1.16* 1.11* 0.92 1.00 0.96 0.96  -Recheck in the morning  Hypokalemia/hypomagnesemia: K3.4.  Mg 2.3. -K-Dur 40 mill equivalent x2  Hyponatremia: Relatively stable. -Continue monitoring  History of anal cancer-Per last oncology note, seems to be in remission after she completed course of Xeloda and radiation on 04/13/2020.  She is followed by Dr. Benay Spice -Outpatient follow-up.  Hypothyroidism -Continue home Synthroid  Headache: Likely tension headache.  Resolved. -As needed Tylenol  Insomnia -Trazodone at night.  There is no height or weight on file to calculate BMI.         DVT prophylaxis:  Place and maintain sequential compression device Start: 01/15/21 1648  Code Status: Full code Family Communication: Patient and/or RN. Available if any question.  Level of care: Med-Surg  Status is: Inpatient  Remains inpatient appropriate because:IV treatments appropriate due to intensity of illness or inability to take PO and Inpatient level of care appropriate due to severity of illness   Dispo: The patient is from: Home              Anticipated d/c is to: Home on 5/3 if remains afebrile              Patient currently is not medically stable to d/c.   Difficult to place patient No             Consultants:  Urology Infectious  disease over the phone   Sch Meds:  Scheduled Meds: . azithromycin  500 mg Oral Daily  . levothyroxine  50 mcg Oral QAC breakfast  . sodium chloride flush  10-40 mL Intracatheter Q12H  . traZODone  50 mg Oral QHS   Continuous Infusions: . ertapenem 1,000 mg (01/17/21 1509)   PRN Meds:.acetaminophen, guaiFENesin-dextromethorphan, loperamide, menthol-cetylpyridinium, sodium chloride flush, traMADol  Antimicrobials: Anti-infectives (From admission, onward)   Start     Dose/Rate Route Frequency Ordered Stop   01/17/21 0900  azithromycin (ZITHROMAX) tablet 500 mg        500 mg Oral Daily 01/17/21 0739 01/22/21 0959   01/16/21 1600  ertapenem (INVANZ) 1,000 mg in sodium chloride 0.9 % 100 mL IVPB        1 g 200 mL/hr over 30 Minutes Intravenous Every 24 hours 01/16/21 1503 01/19/21 1559   01/15/21 0800  vancomycin (VANCOREADY) IVPB 750 mg/150 mL  Status:  Discontinued        750 mg 150 mL/hr over 60 Minutes Intravenous Every 24 hours 01/14/21 0651 01/14/21 1235   01/14/21 1600  ertapenem (INVANZ) 1,000 mg  in sodium chloride 0.9 % 100 mL IVPB  Status:  Discontinued        1 g 200 mL/hr over 30 Minutes Intravenous Every 24 hours 01/13/21 2345 01/15/21 1155   01/14/21 0745  vancomycin (VANCOREADY) IVPB 1750 mg/350 mL        1,750 mg 175 mL/hr over 120 Minutes Intravenous  Once 01/14/21 0649 01/14/21 1026   01/13/21 1915  ertapenem (INVANZ) 1,000 mg in sodium chloride 0.9 % 100 mL IVPB  Status:  Discontinued        1 g 200 mL/hr over 30 Minutes Intravenous  Once 01/13/21 1855 01/13/21 1902       I have personally reviewed the following labs and images: CBC: Recent Labs  Lab 01/13/21 1941 01/14/21 0415 01/15/21 0614 01/16/21 0315 01/17/21 0449 01/18/21 0420  WBC 9.7 6.4 7.5 7.5 5.7 5.5  NEUTROABS 9.0*  --   --   --   --   --   HGB 10.3* 8.5* 8.2* 8.4* 9.1* 9.4*  HCT 32.8* 27.4* 26.5* 26.3* 28.9* 29.8*  MCV 87.0 89.5 87.7 86.8 87.0 85.6  PLT 235 154 175 209 243 270   BMP  &GFR Recent Labs  Lab 01/14/21 0415 01/15/21 0614 01/16/21 0315 01/17/21 0449 01/18/21 0420  NA 134* 135 134* 135 140  K 3.4* 3.8 3.8 3.5 3.4*  CL 106 108 104 105 105  CO2 20* 21* 22 24 25   GLUCOSE 116* 106* 108* 103* 106*  BUN 19 19 15 14 12   CREATININE 1.11* 0.92 1.00 0.96 0.96  CALCIUM 7.6* 8.0* 8.2* 8.1* 8.4*  MG  --  1.7 2.0 2.1 2.3  PHOS  --  2.4* 2.2* 3.4 3.1   Estimated Creatinine Clearance: 54.5 mL/min (by C-G formula based on SCr of 0.96 mg/dL). Liver & Pancreas: Recent Labs  Lab 01/13/21 1941 01/15/21 [id number] 01/16/21 0315 01/17/21 0449 01/18/21 0420  AST 52*  --   --   --   --   ALT 40  --   --   --   --   ALKPHOS 121  --   --   --   --   BILITOT 0.3  --   --   --   --   PROT 7.3  --   --   --   --   ALBUMIN 3.2* 2.3* 2.2* 2.3* 2.4*   No results for input(s): LIPASE, AMYLASE in the last 168 hours. No results for input(s): AMMONIA in the last 168 hours. Diabetic: No results for input(s): HGBA1C in the last 72 hours. No results for input(s): GLUCAP in the last 168 hours. Cardiac Enzymes: No results for input(s): CKTOTAL, CKMB, CKMBINDEX, TROPONINI in the last 168 hours. No results for input(s): PROBNP in the last 8760 hours. Coagulation Profile: Recent Labs  Lab 01/13/21 1941  INR 1.2   Thyroid Function Tests: No results for input(s): TSH, T4TOTAL, FREET4, T3FREE, THYROIDAB in the last 72 hours. Lipid Profile: No results for input(s): CHOL, HDL, LDLCALC, TRIG, CHOLHDL, LDLDIRECT in the last 72 hours. Anemia Panel: No results for input(s): VITAMINB12, FOLATE, FERRITIN, TIBC, IRON, RETICCTPCT in the last 72 hours. Urine analysis:    Component Value Date/Time   COLORURINE YELLOW 01/13/2021 1938   APPEARANCEUR CLOUDY (A) 01/13/2021 1938   LABSPEC 1.026 01/13/2021 1938   LABSPEC 1.010 02/14/2017 1604   PHURINE 6.0 01/13/2021 1938   GLUCOSEU NEGATIVE 01/13/2021 1938   GLUCOSEU Negative 02/14/2017 1604   HGBUR LARGE (A) 01/13/2021 1938   BILIRUBINUR  NEGATIVE 01/13/2021 1938   BILIRUBINUR Negative 01/08/2020 0949   BILIRUBINUR Negative 02/14/2017 1604   KETONESUR 5 (A) 01/13/2021 1938   PROTEINUR 100 (A) 01/13/2021 1938   UROBILINOGEN 0.2 01/08/2020 0949   UROBILINOGEN 0.2 02/14/2017 1604   NITRITE NEGATIVE 01/13/2021 1938   LEUKOCYTESUR LARGE (A) 01/13/2021 1938   LEUKOCYTESUR Small 02/14/2017 1604   Sepsis Labs: Invalid input(s): PROCALCITONIN, [place]  Microbiology: Recent Results (from the past 240 hour(s))  Resp Panel by RT-PCR (Flu A&B, Covid) Nasopharyngeal Swab     Status: None   Collection Time: 01/13/21  6:06 AM   Specimen: Nasopharyngeal Swab; Nasopharyngeal(NP) swabs in vial transport medium  Result Value Ref Range Status   SARS Coronavirus 2 by RT PCR NEGATIVE NEGATIVE Final    Comment: (NOTE) SARS-CoV-2 target nucleic acids are NOT DETECTED.  The SARS-CoV-2 RNA is generally detectable in upper respiratory specimens during the acute phase of infection. The lowest concentration of SARS-CoV-2 viral copies this assay can detect is 138 copies/mL. A negative result does not preclude SARS-Cov-2 infection and should not be used as the sole basis for treatment or other patient management decisions. A negative result may occur with  improper specimen collection/handling, submission of specimen other than nasopharyngeal swab, presence of viral mutation(s) within the areas targeted by this assay, and inadequate number of viral copies(<138 copies/mL). A negative result must be combined with clinical observations, patient history, and epidemiological information. The expected result is Negative.  Fact Sheet for Patients:  EntrepreneurPulse.com.au  Fact Sheet for Healthcare Providers:  IncredibleEmployment.be  This test is no t yet approved or cleared by the [place] FDA and  has been authorized for detection and/or diagnosis of SARS-CoV-2 by FDA under an Emergency Use  Authorization (EUA). This EUA will remain  in effect (meaning this test can be used) for the duration of the COVID-19 declaration under Section 564(b)(1) of the Act, 21 U.S.C.section 360bbb-3(b)(1), unless the authorization is terminated  or revoked sooner.       Influenza A by PCR NEGATIVE NEGATIVE Final   Influenza B by PCR NEGATIVE NEGATIVE Final    Comment: (NOTE) The Xpert Xpress SARS-CoV-2/FLU/RSV plus assay is intended as an aid in the diagnosis of influenza from Nasopharyngeal swab specimens and should not be used as a sole basis for treatment. Nasal washings and aspirates are unacceptable for Xpert Xpress SARS-CoV-2/FLU/RSV testing.  Fact Sheet for Patients: EntrepreneurPulse.com.au  Fact Sheet for Healthcare Providers: IncredibleEmployment.be  This test is not yet approved or cleared by the [place] FDA and has been authorized for detection and/or diagnosis of SARS-CoV-2 by FDA under an Emergency Use Authorization (EUA). This EUA will remain in effect (meaning this test can be used) for the duration of the COVID-19 declaration under Section 564(b)(1) of the Act, 21 U.S.C. section 360bbb-3(b)(1), unless the authorization is terminated or revoked.  Performed at [facility], [place] [address] [place], [place] [address]   Blood Culture (routine x 2)     Status: None   Collection Time: 01/13/21  7:20 PM   Specimen: BLOOD  Result Value Ref Range Status   Specimen Description   Final    BLOOD LEFT ANTECUBITAL Performed at [place] [facility] [address], [place], [place] [address]    Special Requests   Final    BOTTLES DRAWN AEROBIC AND ANAEROBIC Blood Culture adequate volume Performed at [place] [facility] [address] [place], [place] [address]    Culture   Final  NO GROWTH 5 DAYS Performed at [facility], [place] [address] [place], [place] [address]    Report  Status 01/18/2021 FINAL  Final  Blood Culture (routine x 2)     Status: None   Collection Time: 01/13/21  7:31 PM   Specimen: BLOOD  Result Value Ref Range Status   Specimen Description   Final    BLOOD RIGHT PICC LINE Performed at [place] [address] [place], [place] [address]    Special Requests   Final    BOTTLES DRAWN AEROBIC AND ANAEROBIC Blood Culture adequate volume Performed at [place] [address] [place], [place] [address]    Culture   Final    NO GROWTH 5 DAYS Performed at [facility], [place] [address] [place], [place] [address]    Report Status 01/18/2021 FINAL  Final  Urine culture     Status: None   Collection Time: 01/13/21  7:38 PM   Specimen: In/Out Cath Urine  Result Value Ref Range Status   Specimen Description   Final    IN/OUT CATH URINE Performed at [place] [address] [place], [place] [address]    Special Requests   Final    NONE Performed at [facility], [place] [address] [place], [place] [address]    Culture   Final    NO GROWTH Performed at [facility], [place] [address] [place], [place] [address]    Report Status 01/15/2021 FINAL  Final  Resp Panel by RT-PCR (Flu A&B, Covid) Nasopharyngeal Swab     Status: None   Collection Time: 01/13/21  7:41 PM   Specimen: Nasopharyngeal Swab; Nasopharyngeal(NP) swabs in vial transport medium  Result Value Ref Range Status   SARS Coronavirus 2 by RT PCR NEGATIVE NEGATIVE Final    Comment: (NOTE) SARS-CoV-2 target nucleic acids are NOT DETECTED.  The SARS-CoV-2 RNA is generally detectable in upper respiratory specimens during the acute phase of infection. The lowest concentration of SARS-CoV-2 viral copies this assay can detect is 138 copies/mL. A negative result does not preclude SARS-Cov-2 infection and should not be used as the sole basis for treatment or other patient management decisions.  A negative result may occur with  improper specimen collection/handling, submission of specimen other than nasopharyngeal swab, presence of viral mutation(s) within the areas targeted by this assay, and inadequate number of viral copies(<138 copies/mL). A negative result must be combined with clinical observations, patient history, and epidemiological information. The expected result is Negative.  Fact Sheet for Patients:  EntrepreneurPulse.com.au  Fact Sheet for Healthcare Providers:  IncredibleEmployment.be  This test is no t yet approved or cleared by the [place] FDA and  has been authorized for detection and/or diagnosis of SARS-CoV-2 by FDA under an Emergency Use Authorization (EUA). This EUA will remain  in effect (meaning this test can be used) for the duration of the COVID-19 declaration under Section 564(b)(1) of the Act, 21 U.S.C.section 360bbb-3(b)(1), unless the authorization is terminated  or revoked sooner.       Influenza A by PCR NEGATIVE NEGATIVE Final   Influenza B by PCR NEGATIVE NEGATIVE Final    Comment: (NOTE) The Xpert Xpress SARS-CoV-2/FLU/RSV plus assay is intended as an aid in the diagnosis of influenza from Nasopharyngeal swab specimens and should not be used as a sole basis for treatment. Nasal washings and aspirates are unacceptable for Xpert Xpress SARS-CoV-2/FLU/RSV testing.  Fact Sheet  for Patients: EntrepreneurPulse.com.au  Fact Sheet for Healthcare Providers: IncredibleEmployment.be  This test is not yet approved or cleared by the [place] FDA and has been authorized for detection and/or diagnosis of SARS-CoV-2 by FDA under an Emergency Use Authorization (EUA). This EUA will remain in effect (meaning this test can be used) for the duration of the COVID-19 declaration under Section 564(b)(1) of the Act, 21 U.S.C. section 360bbb-3(b)(1), unless the authorization  is terminated or revoked.  Performed at [facility], [place] [address] [place], [place] [address]   MRSA PCR Screening     Status: None   Collection Time: 01/14/21  9:01 AM   Specimen: Nasal Mucosa; Nasopharyngeal  Result Value Ref Range Status   MRSA by PCR NEGATIVE NEGATIVE Final    Comment:        The GeneXpert MRSA Assay (FDA approved for NASAL specimens only), is one component of a comprehensive MRSA colonization surveillance program. It is not intended to diagnose MRSA infection nor to guide or monitor treatment for MRSA infections. Performed at [facility], [place] [address] [place], [place] [address]   Respiratory (~20 pathogens) panel by PCR     Status: None   Collection Time: 01/16/21  5:20 PM   Specimen: Nasopharyngeal Swab; Respiratory  Result Value Ref Range Status   Adenovirus NOT DETECTED NOT DETECTED Final   Coronavirus 229E NOT DETECTED NOT DETECTED Final    Comment: (NOTE) The Coronavirus on the Respiratory Panel, DOES NOT test for the novel  Coronavirus (2019 nCoV)    Coronavirus HKU1 NOT DETECTED NOT DETECTED Final   Coronavirus NL63 NOT DETECTED NOT DETECTED Final   Coronavirus OC43 NOT DETECTED NOT DETECTED Final   Metapneumovirus NOT DETECTED NOT DETECTED Final   Rhinovirus / Enterovirus NOT DETECTED NOT DETECTED Final   Influenza A NOT DETECTED NOT DETECTED Final   Influenza B NOT DETECTED NOT DETECTED Final   Parainfluenza Virus 1 NOT DETECTED NOT DETECTED Final   Parainfluenza Virus 2 NOT DETECTED NOT DETECTED Final   Parainfluenza Virus 3 NOT DETECTED NOT DETECTED Final   Parainfluenza Virus 4 NOT DETECTED NOT DETECTED Final   Respiratory Syncytial Virus NOT DETECTED NOT DETECTED Final   Bordetella pertussis NOT DETECTED NOT DETECTED Final   Bordetella Parapertussis NOT DETECTED NOT DETECTED Final   Chlamydophila pneumoniae NOT DETECTED NOT DETECTED Final   Mycoplasma pneumoniae NOT DETECTED NOT DETECTED  Final    Comment: Performed at [facility], [place]. [address], [place], [place] [address]    Radiology Studies: No results found.   Sadarius Norman T. [place]  If 7PM-7AM, please contact night-coverage [email or web address] 01/18/2021, 12:42 PM

## 2021-01-18 NOTE — Progress Notes (Signed)
Talked with patient regarding  changing IV midline dressing every 7 days which is due today.  Pt deferred at this time - explained that there is an anti microbial patch around insertion site and that it is best practice to change that, scrub the skin surrounding area and apply an new antimicrobial patch - Patient stated that she should be going home tomorrow.

## 2021-01-19 DIAGNOSIS — C210 Malignant neoplasm of anus, unspecified: Secondary | ICD-10-CM | POA: Diagnosis not present

## 2021-01-19 DIAGNOSIS — J189 Pneumonia, unspecified organism: Secondary | ICD-10-CM | POA: Diagnosis not present

## 2021-01-19 DIAGNOSIS — N179 Acute kidney failure, unspecified: Secondary | ICD-10-CM | POA: Diagnosis not present

## 2021-01-19 DIAGNOSIS — N12 Tubulo-interstitial nephritis, not specified as acute or chronic: Secondary | ICD-10-CM | POA: Diagnosis not present

## 2021-01-19 LAB — CBC
HCT: 28.7 % — ABNORMAL LOW (ref 36.0–46.0)
Hemoglobin: 9 g/dL — ABNORMAL LOW (ref 12.0–15.0)
MCH: 27.6 pg (ref 26.0–34.0)
MCHC: 31.4 g/dL (ref 30.0–36.0)
MCV: 88 fL (ref 80.0–100.0)
Platelets: 263 10*3/uL (ref 150–400)
RBC: 3.26 MIL/uL — ABNORMAL LOW (ref 3.87–5.11)
RDW: 15.2 % (ref 11.5–15.5)
WBC: 5.6 10*3/uL (ref 4.0–10.5)
nRBC: 0 % (ref 0.0–0.2)

## 2021-01-19 LAB — RENAL FUNCTION PANEL
Albumin: 2.4 g/dL — ABNORMAL LOW (ref 3.5–5.0)
Anion gap: 9 (ref 5–15)
BUN: 16 mg/dL (ref 8–23)
CO2: 23 mmol/L (ref 22–32)
Calcium: 8.4 mg/dL — ABNORMAL LOW (ref 8.9–10.3)
Chloride: 107 mmol/L (ref 98–111)
Creatinine, Ser: 1.1 mg/dL — ABNORMAL HIGH (ref 0.44–1.00)
GFR, Estimated: 54 mL/min — ABNORMAL LOW (ref >60)
Glucose, Bld: 101 mg/dL — ABNORMAL HIGH (ref 70–99)
Phosphorus: 3.3 mg/dL (ref 2.5–4.6)
Potassium: 4.4 mmol/L (ref 3.5–5.1)
Sodium: 139 mmol/L (ref 135–145)

## 2021-01-19 LAB — MAGNESIUM: Magnesium: 2.3 mg/dL (ref 1.7–2.4)

## 2021-01-19 LAB — PROCALCITONIN: Procalcitonin: 0.12 ng/mL

## 2021-01-19 MED ORDER — AZITHROMYCIN 500 MG PO TABS: 500.0000 mg | ORAL_TABLET | Freq: Every day | ORAL | 0 refills | Status: AC

## 2021-01-19 NOTE — Discharge Summary (Signed)
Physician Discharge Summary  Ashley Moore [id number] DOB: 23-Jun-1951 DOA: 01/13/2021  PCP: [name], MD  Admit date: 01/13/2021 Discharge date: 01/19/2021  Admitted From: Home Disposition: Home  Recommendations for Outpatient Follow-up:  1. Follow ups as below. 2. Please obtain CBC/BMP/Mag at follow up 3. Please follow up on the following pending results: None  Home Health: None Equipment/Devices: None  Discharge Condition: Stable CODE STATUS: Full code   Follow-up Information    Panosh, Standley Brooking, MD. Schedule an appointment as soon as possible for a visit in 1 week(s).   Specialties: Internal Medicine, Pediatrics Contact information: [place] [address] [phone]                Hospital Course: 70 year old F with history of anal cancer followed by Dr. Benay Spice, and recent hospitalization from 4/21-4//27 for Klebsiella pneumonia bacteremia, pyelonephritis, obstructive uropathy with right hydronephrosis for which she had right ureteral stent and discharged on IV Invanz via midline through 01/17/21.  She had ureteral stent removed on 4/27 and developed fever, chills and dry cough after she returned home that prompted her to come to ED.  Patient was febrile, tachycardic and tachypneic in ED.  UA with pyuria and hematuria.  CXR with some hilar opacity.  Lactic acid within normal.  Admitted with working diagnosis of sepsis.   Blood and urine cultures NGTD.  MRSA PCR negative.  Renal function improved.  Renal US showed moderate right-sided hydronephrosis.  Given her previous history of significant pain with stent, her urologist recommended allowing some degree of hydronephrosis unless she is clinically worse.   Patient continued to spike fever intermittently.  CT abdomen and pelvis with mild to moderate hydronephrosis, possible recurrent LAD. Two view CXR concerning for atypical pneumonia.  Full RVP negative.  Started on azithromycin  for atypical pneumonia on 5/1.  Eventually fever resolved, and remained afebrile for over 48 hours prior to discharge.  She completed course of IV Invanz on 5/2.  Midline removed.  Discharged on azithromycin for 2 more days to complete 5 days course.  See individual problem list below for more on hospital course.  Discharge Diagnoses:  Sepsis due to atypical pneumonia and complicated UTI in patient with recent pyelonephritis, bacteremia and right hydronephrosis s/p ureteral stent and removal.  UA with pyuria and hematuria but urine and blood cultures negative.  Renal US and CT abdomen and pelvis shows recurrent mild to moderate hydronephrosis.  CXR showing atypical pneumonia.  RVP negative.  Procalcitonin 0.32> 0.23>0.12.  Last fever on 01/16/2021. -IV Invanz from 01/07/2021 to 01/18/2021 -Azithromycin 500 mg daily 5/1 through 5/5 -Appreciate input by ID and urology.  History of obstructive uropathy/right hydronephrosis s/p ureteral stent which was removed on 4/27.  Renal US and CT abdomen and pelvis with mild to moderate residual right hydronephrosis.  Urology prefers to avoid ureteral stent placement given history of intolerance -Outpatient follow-up with urology  Normocytic anemia: Noted about 2.5 g drop in Hgb.  Likely dilutional from IV fluid resuscitation.  She denies melena or hematochezia.  However, she has some hematuria Recent Labs    06/17/20 0906 10/28/20 0834 01/13/21 0744 01/13/21 1941 01/14/21 0415 01/15/21 0614 01/16/21 0315 01/17/21 0449 01/18/21 0420 01/19/21 0353  HGB 12.0 11.7* 11.6* 10.3* 8.5* 8.2* 8.4* 9.1* 9.4* 9.0*  -Recheck CBC at follow-up  AKI: Improving.  Excellent urine output.  Renal US with moderate right hydro Recent Labs    06/17/20 0906 10/28/20 0834 01/13/21 0744 01/13/21 1941  01/14/21 0415 01/15/21 [id number] 01/16/21 0315 01/17/21 0449 01/18/21 0420 01/19/21 0353  BUN 16 17 16 19 19 19 15 14 12 16   CREATININE 0.70 0.77 0.90 1.16* 1.11* 0.92  1.00 0.96 0.96 1.10*  -Recheck renal function at follow-up -Discontinued Mobic on discharge -Advised to avoid NSAIDs  Hypokalemia/hypomagnesemia/hyponatremia:  Resolved  History of anal cancer-Per last oncology note, seems to be in remission after she completed course of Xeloda and radiation on 04/13/2020.  She is followed by Dr. Benay Spice -Outpatient follow-up with oncology  Hypothyroidism -Continue home Synthroid  Headache: Likely tension headache.  Resolved. -As needed Tylenol  Insomnia -Trazodone at night.  Nutrition There is no height or weight on file to calculate BMI. Nutrition Problem: Increased nutrient needs Etiology: acute illness Signs/Symptoms: estimated needs Interventions: Ensure Enlive (each supplement provides 350kcal and 20 grams of protein),MVI      Discharge Exam: Vitals:   01/18/21 2122 01/19/21 0456  BP: 131/72 (!) 142/80  Pulse: 90 82  Resp: 20 18  Temp: 98 F (36.7 C) 98.6 F (37 C)  SpO2: 100% 97%    GENERAL: No apparent distress.  Nontoxic. HEENT: MMM.  Vision and hearing grossly intact.  NECK: Supple.  No apparent JVD.  RESP: On RA.  No IWOB.  Fair aeration bilaterally. CVS:  RRR. Heart sounds normal.  ABD/GI/GU: Bowel sounds present. Soft. Non tender.  MSK/EXT:  Moves extremities. No apparent deformity. No edema.  SKIN: no apparent skin lesion or wound NEURO: Awake, alert and oriented appropriately.  No apparent focal neuro deficit. PSYCH: Calm. Normal affect.   Discharge Instructions  Discharge Instructions    Call MD for:  extreme fatigue   Complete by: As directed    Call MD for:  persistant dizziness or light-headedness   Complete by: As directed    Call MD for:  persistant nausea and vomiting   Complete by: As directed    Call MD for:  severe uncontrolled pain   Complete by: As directed    Call MD for:  temperature >100.4   Complete by: As directed    Diet general   Complete by: As directed    Discharge instructions    Complete by: As directed    It has been a pleasure taking care of you!  You were hospitalized with fever and chills likely from pneumonia and prior kidney infection.  You completed treatment for the kidney infection.  We have started you on antibiotic for pneumonia.  We are discharging you more of this antibiotic for 2 more days to complete treatment course.  Please follow-up with your primary care doctor in 1 to 2 weeks or sooner.  You may follow-up with urologist as previously planned.  Please avoid meloxicam or any over-the-counter pain medication other than plain Tylenol as they are not good for your kidneys.   Take care,   Increase activity slowly   Complete by: As directed    No wound care   Complete by: As directed      Allergies as of 01/19/2021      Reactions   Benadryl [diphenhydramine] Other (See Comments)   Clayton Lefort all over    Otis all over    Other Diarrhea   greenbeans and citrus   Lemon Oil Diarrhea   Penicillins Rash      Medication List    STOP taking these medications   B Complex-C Caps   ertapenem 1 g injection Commonly known as:  INVANZ   meloxicam 15 MG tablet Commonly known as: MOBIC     TAKE these medications   acetaminophen 500 MG tablet Commonly known as: TYLENOL Take 1,000 mg by mouth daily.   azithromycin 500 MG tablet Commonly known as: ZITHROMAX Take 1 tablet (500 mg total) by mouth daily for 2 days.   calcium-vitamin D 500-200 MG-UNIT tablet Commonly known as: OSCAL WITH D Take 1 tablet by mouth daily.   conjugated estrogens vaginal cream Commonly known as: PREMARIN Place 1 Applicatorful vaginally daily.   CYSTEX PO Take 1 tablet by mouth daily.   levothyroxine 50 MCG tablet Commonly known as: SYNTHROID TAKE 1 TABLET EVERY DAY What changed: when to take this   mirabegron ER 25 MG Tb24 tablet Commonly known as: MYRBETRIQ Take 25 mg by mouth daily.   Multivitamin Adult Chew Chew 1 capsule by mouth  daily.   OVER THE COUNTER MEDICATION Place 1 drop into both eyes daily. Soothing eye wash   traMADol 50 MG tablet Commonly known as: Ultram Take 1-2 tablets (50-100 mg total) by mouth every 6 (six) hours as needed for moderate pain (post-operatively).   Turmeric 500 MG Caps Take 500 mg by mouth daily.       Consultations:  Neurology  Infectious disease over the phone  Procedures/Studies:   DG Chest 2 View  Result Date: 01/16/2021 CLINICAL DATA:  Tachypnea, cough, fever.  History of anal cancer. EXAM: CHEST - 2 VIEW COMPARISON:  Chest x-ray dated 01/13/2021. FINDINGS: Increasing prominence of the interstitial markings throughout both lungs. No pleural effusion or pneumothorax is seen. Heart size and mediastinal contours are stable. Suspected chronic pulmonary artery hypertension. Sclerotic changes at the RIGHT humeral head, suggesting old trauma or avascular necrosis. No acute-appearing osseous abnormality. IMPRESSION: Increasing prominence of the interstitial markings throughout both lungs, compatible with atypical pneumonia or acute interstitial edema due to CHF/volume overload. Electronically Signed   By: Franki Cabot M.D.   On: 01/16/2021 17:28   CT ABDOMEN PELVIS W CONTRAST  Result Date: 01/16/2021 CLINICAL DATA:  Recent hospitalization for bacteremia, history of ureteral stent removal with subsequent development of fever and chills, history of anal cancer EXAM: CT ABDOMEN AND PELVIS WITH CONTRAST TECHNIQUE: Multidetector CT imaging of the abdomen and pelvis was performed using the standard protocol following bolus administration of intravenous contrast. CONTRAST:  134mL OMNIPAQUE IOHEXOL 300 MG/ML  SOLN COMPARISON:  10/28/2020 FINDINGS: Lower chest: There are small bilateral pleural effusions, right greater than left. Minimal dependent consolidation right lower lobe consistent with atelectasis. Hepatobiliary: No focal liver abnormality is seen. No gallstones, gallbladder wall  thickening, or biliary dilatation. Pancreas: Unremarkable. No pancreatic ductal dilatation or surrounding inflammatory changes. Spleen: Normal in size without focal abnormality. Adrenals/Urinary Tract: There has been interval removal of the right ureteral stent, with development of mild to moderate right-sided hydronephrosis and proximal right hydroureter. Delayed enhancement of the right kidney may reflect sequela of obstruction or underlying infection. The left kidney enhances normally.  No urinary tract calculi. The adrenals are unremarkable. Bladder is decompressed, with diffuse nonspecific bladder wall thickening. Gas in the bladder lumen may be from recent catheterization. Stomach/Bowel: No bowel obstruction or ileus. Stable circumferential rectal wall thickening which may be related to previous radiation therapy. No acute inflammatory changes. Vascular/Lymphatic: Minimal atherosclerosis of the aorta again noted. Marked ectasia of the bilateral common iliac arteries unchanged. There is new soft tissue density interposed between the IVC and right psoas muscle on image 38/2, measuring approximately 2.3 x 1.5  cm. Lymphadenopathy cannot be excluded. This is at the level of apparent obstruction of the right ureter. Reproductive: Marked uterine atrophy again noted. No adnexal masses. Other: No free fluid or free gas.  No abdominal wall hernia. Musculoskeletal: Subcutaneous edema is seen within the bilateral flanks and lower anterior abdominal wall. There are no acute or destructive bony lesions. Reconstructed images demonstrate no additional findings. IMPRESSION: 1. Recurrent right-sided hydronephrosis and proximal right hydroureter after removal of right ureteral stent. Delayed enhancement of the right kidney may reflect sequela of obstruction versus underlying infection. 2. New soft tissue nodule along the course of the right ureter, interposed between the right psoas muscle and IVC, suspicious for recurrent  lymphadenopathy. 3. Stable circumferential rectal wall thickening, likely sequela of previous radiation therapy. 4. Stable nonspecific bladder wall thickening given limited distention of the bladder. This may also be related to previous radiation therapy. Electronically Signed   By: Randa Ngo M.D.   On: 01/16/2021 17:15   US RENAL  Result Date: 01/14/2021 CLINICAL DATA:  Acute renal insufficiency. Recent right stent removal. EXAM: RENAL / URINARY TRACT ULTRASOUND COMPLETE COMPARISON:  CT scan 10/28/2020 FINDINGS: Right Kidney: Renal measurements: 12.4 x 5.4 x 6.2 cm = volume: 218.9 mL. Normal renal cortical thickness and echogenicity. There is moderate hydronephrosis. Left Kidney: Renal measurements: 12.0 x 5.8 x 5.6 cm = volume: 202.8 mL. Normal renal cortical thickness and echogenicity. No lesions or hydronephrosis. Bladder: Unremarkable. Other: None. IMPRESSION: 1. Moderate right-sided hydronephrosis. 2. No renal lesions or renal calculi. 3. No bladder mass or calculi. Electronically Signed   By: Marijo Sanes M.D.   On: 01/14/2021 16:37   DG Chest Port 1 View  Result Date: 01/13/2021 CLINICAL DATA:  70 year old female with concern for sepsis. EXAM: PORTABLE CHEST 1 VIEW COMPARISON:  Chest radiograph dated 01/21/2013. chest CT report dated 11/04/2020. The images and non available for viewing. FINDINGS: There is rounded prominence of the right hilum which may represent adenopathy or developing opacity. A mass is less likely as none was reported on the recent chest CT of 11/04/2020. CT may provide better evaluation if clinically indicated. There is mild eventration of the right hemidiaphragm. Mild diffuse interstitial prominence may be chronic. Atypical infection is less likely. Clinical correlation is recommended. No pleural effusion, or pneumothorax. Mild cardiomegaly. no acute osseous pathology. IMPRESSION: 1. Rounded prominence of the right hilum may represent adenopathy or developing opacity.  Clinical correlation is recommended. 2. Mild diffuse interstitial prominence may be chronic. Atypical infection is less likely. Electronically Signed   By: Anner Crete M.D.   On: 01/13/2021 19:44        The results of significant diagnostics from this hospitalization (including imaging, microbiology, ancillary and laboratory) are listed below for reference.     Microbiology: Recent Results (from the past 240 hour(s))  Resp Panel by RT-PCR (Flu A&B, Covid) Nasopharyngeal Swab     Status: None   Collection Time: 01/13/21  6:06 AM   Specimen: Nasopharyngeal Swab; Nasopharyngeal(NP) swabs in vial transport medium  Result Value Ref Range Status   SARS Coronavirus 2 by RT PCR NEGATIVE NEGATIVE Final    Comment: (NOTE) SARS-CoV-2 target nucleic acids are NOT DETECTED.  The SARS-CoV-2 RNA is generally detectable in upper respiratory specimens during the acute phase of infection. The lowest concentration of SARS-CoV-2 viral copies this assay can detect is 138 copies/mL. A negative result does not preclude SARS-Cov-2 infection and should not be used as the sole basis for treatment or other  patient management decisions. A negative result may occur with  improper specimen collection/handling, submission of specimen other than nasopharyngeal swab, presence of viral mutation(s) within the areas targeted by this assay, and inadequate number of viral copies(<138 copies/mL). A negative result must be combined with clinical observations, patient history, and epidemiological information. The expected result is Negative.  Fact Sheet for Patients:  EntrepreneurPulse.com.au  Fact Sheet for Healthcare Providers:  IncredibleEmployment.be  This test is no t yet approved or cleared by the [place] FDA and  has been authorized for detection and/or diagnosis of SARS-CoV-2 by FDA under an Emergency Use Authorization (EUA). This EUA will remain  in effect  (meaning this test can be used) for the duration of the COVID-19 declaration under Section 564(b)(1) of the Act, 21 U.S.C.section 360bbb-3(b)(1), unless the authorization is terminated  or revoked sooner.       Influenza A by PCR NEGATIVE NEGATIVE Final   Influenza B by PCR NEGATIVE NEGATIVE Final    Comment: (NOTE) The Xpert Xpress SARS-CoV-2/FLU/RSV plus assay is intended as an aid in the diagnosis of influenza from Nasopharyngeal swab specimens and should not be used as a sole basis for treatment. Nasal washings and aspirates are unacceptable for Xpert Xpress SARS-CoV-2/FLU/RSV testing.  Fact Sheet for Patients: EntrepreneurPulse.com.au  Fact Sheet for Healthcare Providers: IncredibleEmployment.be  This test is not yet approved or cleared by the [place] FDA and has been authorized for detection and/or diagnosis of SARS-CoV-2 by FDA under an Emergency Use Authorization (EUA). This EUA will remain in effect (meaning this test can be used) for the duration of the COVID-19 declaration under Section 564(b)(1) of the Act, 21 U.S.C. section 360bbb-3(b)(1), unless the authorization is terminated or revoked.  Performed at [facility], [place] [address], [place], [place] [address]   Blood Culture (routine x 2)     Status: None   Collection Time: 01/13/21  7:20 PM   Specimen: BLOOD  Result Value Ref Range Status   Specimen Description   Final    BLOOD LEFT ANTECUBITAL Performed at [facility] [address], [place], [place] [address]    Special Requests   Final    BOTTLES DRAWN AEROBIC AND ANAEROBIC Blood Culture adequate volume Performed at [facility] [address], [place], [place] [address]    Culture   Final    NO GROWTH 5 DAYS Performed at [facility], [place] [address], [place], [place] [address]    Report Status 01/18/2021 FINAL  Final  Blood Culture (routine x  2)     Status: None   Collection Time: 01/13/21  7:31 PM   Specimen: BLOOD  Result Value Ref Range Status   Specimen Description   Final    BLOOD RIGHT PICC LINE Performed at [facility] [address] [place], [place] [address]    Special Requests   Final    BOTTLES DRAWN AEROBIC AND ANAEROBIC Blood Culture adequate volume Performed at [facility] [address] [place], [place] [address]    Culture   Final    NO GROWTH 5 DAYS Performed at [facility], [place] [address] [place], [place] [address]    Report Status 01/18/2021 FINAL  Final  Urine culture     Status: None   Collection Time: 01/13/21  7:38 PM   Specimen: In/Out Cath Urine  Result Value Ref Range Status   Specimen Description   Final  IN/OUT CATH URINE Performed at [facility], [place] [address], [place], [place] [address]    Special Requests   Final    NONE Performed at [facility], [place] [address] [place], [place] [address]    Culture   Final    NO GROWTH Performed at [facility], [place] [address] [place], [place] [address]    Report Status 01/15/2021 FINAL  Final  Resp Panel by RT-PCR (Flu A&B, Covid) Nasopharyngeal Swab     Status: None   Collection Time: 01/13/21  7:41 PM   Specimen: Nasopharyngeal Swab; Nasopharyngeal(NP) swabs in vial transport medium  Result Value Ref Range Status   SARS Coronavirus 2 by RT PCR NEGATIVE NEGATIVE Final    Comment: (NOTE) SARS-CoV-2 target nucleic acids are NOT DETECTED.  The SARS-CoV-2 RNA is generally detectable in upper respiratory specimens during the acute phase of infection. The lowest concentration of SARS-CoV-2 viral copies this assay can detect is 138 copies/mL. A negative result does not preclude SARS-Cov-2 infection and should not be used as the sole basis for treatment or other patient management decisions. A negative result may occur with  improper specimen  collection/handling, submission of specimen other than nasopharyngeal swab, presence of viral mutation(s) within the areas targeted by this assay, and inadequate number of viral copies(<138 copies/mL). A negative result must be combined with clinical observations, patient history, and epidemiological information. The expected result is Negative.  Fact Sheet for Patients:  EntrepreneurPulse.com.au  Fact Sheet for Healthcare Providers:  IncredibleEmployment.be  This test is no t yet approved or cleared by the [place] FDA and  has been authorized for detection and/or diagnosis of SARS-CoV-2 by FDA under an Emergency Use Authorization (EUA). This EUA will remain  in effect (meaning this test can be used) for the duration of the COVID-19 declaration under Section 564(b)(1) of the Act, 21 U.S.C.section 360bbb-3(b)(1), unless the authorization is terminated  or revoked sooner.       Influenza A by PCR NEGATIVE NEGATIVE Final   Influenza B by PCR NEGATIVE NEGATIVE Final    Comment: (NOTE) The Xpert Xpress SARS-CoV-2/FLU/RSV plus assay is intended as an aid in the diagnosis of influenza from Nasopharyngeal swab specimens and should not be used as a sole basis for treatment. Nasal washings and aspirates are unacceptable for Xpert Xpress SARS-CoV-2/FLU/RSV testing.  Fact Sheet for Patients: EntrepreneurPulse.com.au  Fact Sheet for Healthcare Providers: IncredibleEmployment.be  This test is not yet approved or cleared by the [place] FDA and has been authorized for detection and/or diagnosis of SARS-CoV-2 by FDA under an Emergency Use Authorization (EUA). This EUA will remain in effect (meaning this test can be used) for the duration of the COVID-19 declaration under Section 564(b)(1) of the Act, 21 U.S.C. section 360bbb-3(b)(1), unless the authorization is terminated or revoked.  Performed at [facility], [place] [address], [place], [place] [address]   MRSA PCR Screening     Status: None   Collection Time: 01/14/21  9:01 AM   Specimen: Nasal Mucosa; Nasopharyngeal  Result Value Ref Range Status   MRSA by PCR NEGATIVE NEGATIVE Final    Comment:        The GeneXpert MRSA Assay (FDA approved for NASAL specimens only), is one component of a comprehensive MRSA colonization surveillance program. It is not intended to diagnose MRSA infection nor to guide or monitor treatment for MRSA infections. Performed at [facility], [place] [place], [place]  [address]   Respiratory (~20 pathogens) panel by PCR     Status: None   Collection Time: 01/16/21  5:20 PM   Specimen: Nasopharyngeal Swab; Respiratory  Result Value Ref Range Status   Adenovirus NOT DETECTED NOT DETECTED Final   Coronavirus 229E NOT DETECTED NOT DETECTED Final    Comment: (NOTE) The Coronavirus on the Respiratory Panel, DOES NOT test for the novel  Coronavirus (2019 nCoV)    Coronavirus HKU1 NOT DETECTED NOT DETECTED Final   Coronavirus NL63 NOT DETECTED NOT DETECTED Final   Coronavirus OC43 NOT DETECTED NOT DETECTED Final   Metapneumovirus NOT DETECTED NOT DETECTED Final   Rhinovirus / Enterovirus NOT DETECTED NOT DETECTED Final   Influenza A NOT DETECTED NOT DETECTED Final   Influenza B NOT DETECTED NOT DETECTED Final   Parainfluenza Virus 1 NOT DETECTED NOT DETECTED Final   Parainfluenza Virus 2 NOT DETECTED NOT DETECTED Final   Parainfluenza Virus 3 NOT DETECTED NOT DETECTED Final   Parainfluenza Virus 4 NOT DETECTED NOT DETECTED Final   Respiratory Syncytial Virus NOT DETECTED NOT DETECTED Final   Bordetella pertussis NOT DETECTED NOT DETECTED Final   Bordetella Parapertussis NOT DETECTED NOT DETECTED Final   Chlamydophila pneumoniae NOT DETECTED NOT DETECTED Final   Mycoplasma pneumoniae NOT DETECTED NOT DETECTED Final    Comment: Performed at [facility], [address] [place], [place] [address]     Labs:  CBC: Recent Labs  Lab 01/13/21 1941 01/14/21 0415 01/15/21 9937 01/16/21 0315 01/17/21 0449 01/18/21 0420 01/19/21 0353  WBC 9.7   < > 7.5 7.5 5.7 5.5 5.6  NEUTROABS 9.0*  --   --   --   --   --   --   HGB 10.3*   < > 8.2* 8.4* 9.1* 9.4* 9.0*  HCT 32.8*   < > 26.5* 26.3* 28.9* 29.8* 28.7*  MCV 87.0   < > 87.7 86.8 87.0 85.6 88.0  PLT 235   < > 175 209 243 270 263   < > = values in this interval not displayed.   BMP &GFR Recent Labs  Lab 01/15/21 0614 01/16/21 0315 01/17/21 0449 01/18/21 0420 01/19/21 0353  NA 135 134* 135 140 139  K 3.8 3.8 3.5 3.4* 4.4  CL 108 104 105 105 107  CO2 21* 22 24 25 23   GLUCOSE 106* 108* 103* 106* 101*  BUN 19 15 14 12 16   CREATININE 0.92 1.00 0.96 0.96 1.10*  CALCIUM 8.0* 8.2* 8.1* 8.4* 8.4*  MG 1.7 2.0 2.1 2.3 2.3  PHOS 2.4* 2.2* 3.4 3.1 3.3   Estimated Creatinine Clearance: 47.5 mL/min (A) (by C-G formula based on SCr of 1.1 mg/dL (H)). Liver & Pancreas: Recent Labs  Lab 01/13/21 1941 01/15/21 1696 01/16/21 0315 01/17/21 0449 01/18/21 0420 01/19/21 0353  AST 52*  --   --   --   --   --   ALT 40  --   --   --   --   --   ALKPHOS 121  --   --   --   --   --   BILITOT 0.3  --   --   --   --   --   PROT 7.3  --   --   --   --   --   ALBUMIN 3.2* 2.3* 2.2* 2.3* 2.4* 2.4*   No results for input(s): LIPASE, AMYLASE in the last 168 hours. No results for input(s): AMMONIA in  the last 168 hours. Diabetic: No results for input(s): HGBA1C in the last 72 hours. No results for input(s): GLUCAP in the last 168 hours. Cardiac Enzymes: No results for input(s): CKTOTAL, CKMB, CKMBINDEX, TROPONINI in the last 168 hours. No results for input(s): PROBNP in the last 8760 hours. Coagulation Profile: Recent Labs  Lab 01/13/21 1941  INR 1.2   Thyroid Function Tests: No results for input(s): TSH, T4TOTAL, FREET4, T3FREE, THYROIDAB in the last 72 hours. Lipid Profile: No results for  input(s): CHOL, HDL, LDLCALC, TRIG, CHOLHDL, LDLDIRECT in the last 72 hours. Anemia Panel: No results for input(s): VITAMINB12, FOLATE, FERRITIN, TIBC, IRON, RETICCTPCT in the last 72 hours. Urine analysis:    Component Value Date/Time   COLORURINE YELLOW 01/13/2021 1938   APPEARANCEUR CLOUDY (A) 01/13/2021 1938   LABSPEC 1.026 01/13/2021 1938   LABSPEC 1.010 02/14/2017 1604   PHURINE 6.0 01/13/2021 1938   GLUCOSEU NEGATIVE 01/13/2021 1938   GLUCOSEU Negative 02/14/2017 1604   HGBUR LARGE (A) 01/13/2021 1938   BILIRUBINUR NEGATIVE 01/13/2021 1938   BILIRUBINUR Negative 01/08/2020 0949   BILIRUBINUR Negative 02/14/2017 1604   KETONESUR 5 (A) 01/13/2021 1938   PROTEINUR 100 (A) 01/13/2021 1938   UROBILINOGEN 0.2 01/08/2020 0949   UROBILINOGEN 0.2 02/14/2017 1604   NITRITE NEGATIVE 01/13/2021 1938   LEUKOCYTESUR LARGE (A) 01/13/2021 1938   LEUKOCYTESUR Small 02/14/2017 1604   Sepsis Labs: Invalid input(s): PROCALCITONIN, LACTICIDVEN   Time coordinating discharge: 40 minutes  SIGNED:  Mercy Riding, MD  [facility] 01/19/2021, 5:26 PM  If 7PM-7AM, please contact night-coverage [email or web address]

## 2021-01-19 NOTE — Plan of Care (Signed)
Plan of care reviewed with pt. Pt alert and oriented and verbalizes understanding and cooperation with discharge instructions. Pt's family member to pick her up via personal vehicle and pt to discharge home with self care. Midline discontinued prior to discharge.  Problem: Education: Goal: Knowledge of General Education information will improve Description: Including pain rating scale, medication(s)/side effects and non-pharmacologic comfort measures 01/19/2021 0833 by Barrington Ellison, RN Outcome: Adequate for Discharge 01/19/2021 0833 by Barrington Ellison, RN Outcome: Adequate for Discharge   Problem: Health Behavior/Discharge Planning: Goal: Ability to manage health-related needs will improve 01/19/2021 0833 by Barrington Ellison, RN Outcome: Adequate for Discharge 01/19/2021 0833 by Barrington Ellison, RN Outcome: Adequate for Discharge   Problem: Clinical Measurements: Goal: Ability to maintain clinical measurements within normal limits will improve 01/19/2021 0833 by Barrington Ellison, RN Outcome: Adequate for Discharge 01/19/2021 0833 by Barrington Ellison, RN Outcome: Adequate for Discharge Goal: Will remain free from infection 01/19/2021 0833 by Barrington Ellison, RN Outcome: Adequate for Discharge 01/19/2021 0833 by Barrington Ellison, RN Outcome: Adequate for Discharge Goal: Diagnostic test results will improve 01/19/2021 0833 by Barrington Ellison, RN Outcome: Adequate for Discharge 01/19/2021 0833 by Barrington Ellison, RN Outcome: Adequate for Discharge Goal: Respiratory complications will improve 01/19/2021 0833 by Barrington Ellison, RN Outcome: Adequate for Discharge 01/19/2021 0833 by Barrington Ellison, RN Outcome: Adequate for Discharge Goal: Cardiovascular complication will be avoided 01/19/2021 1245 by Barrington Ellison, RN Outcome: Adequate for Discharge 01/19/2021 [id number] by Barrington Ellison, RN Outcome: Adequate for Discharge   Problem: Activity: Goal: Risk for activity intolerance will  decrease 01/19/2021 0833 by Barrington Ellison, RN Outcome: Adequate for Discharge 01/19/2021 0833 by Barrington Ellison, RN Outcome: Adequate for Discharge   Problem: Nutrition: Goal: Adequate nutrition will be maintained 01/19/2021 0833 by Barrington Ellison, RN Outcome: Adequate for Discharge 01/19/2021 0833 by Barrington Ellison, RN Outcome: Adequate for Discharge   Problem: Coping: Goal: Level of anxiety will decrease 01/19/2021 0833 by Barrington Ellison, RN Outcome: Adequate for Discharge 01/19/2021 0833 by Barrington Ellison, RN Outcome: Adequate for Discharge   Problem: Elimination: Goal: Will not experience complications related to bowel motility 01/19/2021 0833 by Barrington Ellison, RN Outcome: Adequate for Discharge 01/19/2021 0833 by Barrington Ellison, RN Outcome: Adequate for Discharge Goal: Will not experience complications related to urinary retention 01/19/2021 0833 by Barrington Ellison, RN Outcome: Adequate for Discharge 01/19/2021 0833 by Barrington Ellison, RN Outcome: Adequate for Discharge   Problem: Pain Managment: Goal: General experience of comfort will improve 01/19/2021 0833 by Barrington Ellison, RN Outcome: Adequate for Discharge 01/19/2021 0833 by Barrington Ellison, RN Outcome: Adequate for Discharge   Problem: Safety: Goal: Ability to remain free from injury will improve 01/19/2021 0833 by Barrington Ellison, RN Outcome: Adequate for Discharge 01/19/2021 0833 by Barrington Ellison, RN Outcome: Adequate for Discharge   Problem: Skin Integrity: Goal: Risk for impaired skin integrity will decrease 01/19/2021 0833 by Barrington Ellison, RN Outcome: Adequate for Discharge 01/19/2021 0833 by Barrington Ellison, RN Outcome: Adequate for Discharge   Problem: Urinary Elimination: Goal: Signs and symptoms of infection will decrease 01/19/2021 0833 by Barrington Ellison, RN Outcome: Adequate for Discharge 01/19/2021 0833 by Barrington Ellison, RN Outcome: Adequate for Discharge

## 2021-01-19 NOTE — Plan of Care (Signed)

## 2021-01-21 ENCOUNTER — Telehealth: Payer: Self-pay

## 2021-01-21 NOTE — Telephone Encounter (Signed)
Transition Care Management Unsuccessful Follow-up Telephone Call  Date of discharge and from where:  01/19/2021  [facility]   Attempts:  1st Attempt  Reason for unsuccessful TCM follow-up call:  Left voice message

## 2021-01-22 ENCOUNTER — Telehealth: Payer: Self-pay

## 2021-01-22 NOTE — Telephone Encounter (Cosign Needed)
Transition Care Management Unsuccessful Follow-up Telephone Call  Date of discharge and from where:  01/19/2021  [facility]   Attempts:  2nd Attempt  Reason for unsuccessful TCM follow-up call:  Unable to reach patient

## 2021-01-23 ENCOUNTER — Emergency Department (HOSPITAL_COMMUNITY): Payer: Medicare HMO

## 2021-01-23 ENCOUNTER — Encounter (HOSPITAL_COMMUNITY): Payer: Self-pay

## 2021-01-23 ENCOUNTER — Emergency Department (HOSPITAL_COMMUNITY)
Admission: EM | Admit: 2021-01-23 | Discharge: 2021-01-23 | Disposition: A | Discharge status: Home / Self Care | Payer: Medicare HMO | Attending: Emergency Medicine | Admitting: Emergency Medicine

## 2021-01-23 ENCOUNTER — Other Ambulatory Visit: Payer: Self-pay

## 2021-01-23 DIAGNOSIS — N1330 Unspecified hydronephrosis: Secondary | ICD-10-CM | POA: Diagnosis not present

## 2021-01-23 DIAGNOSIS — Z87891 Personal history of nicotine dependence: Secondary | ICD-10-CM | POA: Diagnosis not present

## 2021-01-23 DIAGNOSIS — Z85048 Personal history of other malignant neoplasm of rectum, rectosigmoid junction, and anus: Secondary | ICD-10-CM | POA: Insufficient documentation

## 2021-01-23 DIAGNOSIS — R109 Unspecified abdominal pain: Secondary | ICD-10-CM | POA: Diagnosis not present

## 2021-01-23 DIAGNOSIS — Z8544 Personal history of malignant neoplasm of other female genital organs: Secondary | ICD-10-CM | POA: Insufficient documentation

## 2021-01-23 LAB — CBC WITH DIFFERENTIAL/PLATELET
Abs Immature Granulocytes: 0.07 10*3/uL (ref 0.00–0.07)
Basophils Absolute: 0.1 10*3/uL (ref 0.0–0.1)
Basophils Relative: 1 %
Eosinophils Absolute: 0.2 10*3/uL (ref 0.0–0.5)
Eosinophils Relative: 3 %
HCT: 34.1 % — ABNORMAL LOW (ref 36.0–46.0)
Hemoglobin: 10.6 g/dL — ABNORMAL LOW (ref 12.0–15.0)
Immature Granulocytes: 1 %
Lymphocytes Relative: 11 %
Lymphs Abs: 0.8 10*3/uL (ref 0.7–4.0)
MCH: 27 pg (ref 26.0–34.0)
MCHC: 31.1 g/dL (ref 30.0–36.0)
MCV: 86.8 fL (ref 80.0–100.0)
Monocytes Absolute: 0.7 10*3/uL (ref 0.1–1.0)
Monocytes Relative: 10 %
Neutro Abs: 5.2 10*3/uL (ref 1.7–7.7)
Neutrophils Relative %: 74 %
Platelets: 417 10*3/uL — ABNORMAL HIGH (ref 150–400)
RBC: 3.93 MIL/uL (ref 3.87–5.11)
RDW: 15.1 % (ref 11.5–15.5)
WBC: 6.9 10*3/uL (ref 4.0–10.5)
nRBC: 0 % (ref 0.0–0.2)

## 2021-01-23 LAB — MAGNESIUM: Magnesium: 2.5 mg/dL — ABNORMAL HIGH (ref 1.7–2.4)

## 2021-01-23 LAB — COMPREHENSIVE METABOLIC PANEL
ALT: 28 U/L (ref 0–44)
AST: 29 U/L (ref 15–41)
Albumin: 3.3 g/dL — ABNORMAL LOW (ref 3.5–5.0)
Alkaline Phosphatase: 100 U/L (ref 38–126)
Anion gap: 10 (ref 5–15)
BUN: 22 mg/dL (ref 8–23)
CO2: 23 mmol/L (ref 22–32)
Calcium: 8.9 mg/dL (ref 8.9–10.3)
Chloride: 101 mmol/L (ref 98–111)
Creatinine, Ser: 1.12 mg/dL — ABNORMAL HIGH (ref 0.44–1.00)
GFR, Estimated: 53 mL/min — ABNORMAL LOW (ref >60)
Glucose, Bld: 103 mg/dL — ABNORMAL HIGH (ref 70–99)
Potassium: 4.2 mmol/L (ref 3.5–5.1)
Sodium: 134 mmol/L — ABNORMAL LOW (ref 135–145)
Total Bilirubin: 0.5 mg/dL (ref 0.3–1.2)
Total Protein: 7.7 g/dL (ref 6.5–8.1)

## 2021-01-23 LAB — URINALYSIS, ROUTINE W REFLEX MICROSCOPIC
Bacteria, UA: NONE SEEN
Bilirubin Urine: NEGATIVE
Glucose, UA: NEGATIVE mg/dL
Ketones, ur: NEGATIVE mg/dL
Nitrite: NEGATIVE
Protein, ur: NEGATIVE mg/dL
Specific Gravity, Urine: 1.005 (ref 1.005–1.030)
pH: 6 (ref 5.0–8.0)

## 2021-01-23 MED ORDER — OXYCODONE-ACETAMINOPHEN 5-325 MG PO TABS
1.0000 | ORAL_TABLET | Freq: Once | ORAL | Status: AC
  Administered 2021-01-23: 1 via ORAL
  Filled 2021-01-23: qty 1

## 2021-01-23 NOTE — ED Triage Notes (Signed)
Pt reports right flank pain. Pt states she had a stent removed on 4/27. Pt denies hematuria and any problems urinating.

## 2021-01-23 NOTE — Discharge Instructions (Addendum)
You were seen in the ER for recurrent right flank pain  Labs and CT were ok  Hydronephrosis (swelling of kidney) is stable  Take [phone] mg acetaminophen every 6 hours for pain  Speak to your primary care doctor if your pain continues and you need longer acting opioid pain medicine prescription  I spoke to Dr Roni Bread ([facility]) who was on call - at this time there are no emergent interventions recommended. He would like you to follow up in clinic for re-evaluation.  Call urology and make an appointment for close follow up  Return for worsening pain, nausea, vomiting, fever, changes to urine output, UTI symptoms

## 2021-01-23 NOTE — ED Provider Notes (Addendum)
Emergency Medicine Provider Triage Evaluation Note  Ashley [facility] , a 70 y.o. female  was evaluated in triage.  Pt complains of worsening right sided flank pain x 2-3 days. Hx of sepsis at the end of April [facility] to pyelo. Had stent removal removed 04/27 and subsequently developed chills, came back to the ED and was found to have worsening hydro on ultrasound and concern for nodule seen in right ureter on CT scan (04/30) and new PNA. Admitted at that time and discharged 05/03. Has been having worsening pain unrelieved by Tylenol. No urinary symptoms. No nausea or vomiting. Pt states she was told she needed to have the nodule re-evaluated in the next week and is concerned that is causing pain.   Review of Systems  Positive: + right sided flank pain Negative: - fevers, chills, nausea, vomiting  Physical Exam  BP (!) 143/84 (BP Location: Left Arm)   Pulse 90   Temp 98.2 F (36.8 C) (Oral)   Resp 16   SpO2 100%  Gen:   Awake, no distress   Resp:  Normal effort  MSK:   Moves extremities without difficulty  Other:  + right CVA TTP  Medical Decision Making  Medically screening exam initiated at 3:47 PM.  Appropriate orders placed.  Ashley [facility] was informed that the remainder of the evaluation will be completed by another provider, this initial triage assessment does not replace that evaluation, and the importance of remaining in the ED until their evaluation is complete.        Eustaquio Maize, PA-C 01/23/21 1550    Luna Fuse, MD 01/23/21 2231

## 2021-01-23 NOTE — ED Notes (Signed)
Renal ultrasound

## 2021-01-23 NOTE — ED Provider Notes (Signed)
[facility]-EMERGENCY DEPT Provider Note   CSN: [id number] Arrival date & time: 01/23/21  1512     History Chief Complaint  Patient presents with  . Flank Pain   Ashley [facility] is a 70 y.o. female with pertinent PMH of recent hospitalization 4/21-4/27 for klebsiella pneumonia bacteremia, pyelonephritis, obstructive uropathy and right hydronephrosis s/p Ertapenem via midline (finished 5/2) and ureteral stent that was removed 4/27 and second hospitalization 4/27-5/3 for atypical pneumonia treated with azithromycin (finished 5/5), anemia, hypothyroidism presents to ER for evaluation of acute on chronic right flank pain. Reports this has been presents for 1.5 years, but worsened again yesterday. Non radiating. Feels like a throb/sharp. At first thought pain was from being in bed during hospitalizations. She exercised and pain worsened.  She was told at discharge from hospital that she had a "nodule" between her right kidney and bladder and that it needed to be rechecked with a CT scan soon. She doesn't have a PCP appointment until the 5/16. Since the pain worsened yesterday she decided to come in to make sure that nodule isn't getting worse.  Reports lingering but significantly improved cough since discharge. No fever, CP, SOB. No nausea, vomiting, diarrhea. No dysuria, hematuria, urgency or decreased urine output. No fall. No rash. Has been taking Tylenol without relief. Does not have a urology appointment yet.   HPI     Past Medical History:  Diagnosis Date  . Arthritis   . Bowen's disease    excised 1992  . Chronic low back pain   . Chronic radiation cystitis   . DDD (degenerative disc disease), cervical   . Family history of breast cancer   . Family history of lung cancer   . Family history of non-Hodgkin's lymphoma   . Family history of ovarian cancer   . Headache   . Hx of varicella   . Hydronephrosis of right kidney    urologist--- dr Berneice Heinrich,  malignant,   treated with ureter stent  . Hypothyroidism    followed by pcp  . Lower urinary tract symptoms (LUTS)    01-12-2021  per pt treated with accupuncture at dr Berneice Heinrich office  . Lymphedema of both lower extremities   . PICC (peripherally inserted central catheter) in place 01/11/2021   placed at [facility] in [place], [place] for IV antibiotics  . Positive blood culture 01/08/2021   Klebsiella Pneumaniae,  treated with daily IV antibiotic  . 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 Sepsis due to Klebsiella pneumoniae [facility]) 01/07/2021   pt admitted to [facility] in [place],  dx sepsis secondary to acute pyelonephritis with bacteremia , positive blood culture,  discharged 01-11-2021 home daily IV antibiotic  . Wears glasses     Patient Active Problem List   Diagnosis Date Noted  . SIRS (systemic inflammatory response syndrome) (HCC) 01/13/2021  . Pyelonephritis 01/13/2021  . 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 Genetic testing 01/27/2020  . Family history of breast cancer   . Family history of lung cancer   . Family history of ovarian cancer   . Family history of non-Hodgkin's lymphoma   . 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 Port catheter in place  11/21/2016  . 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 Anal cancer ([place]) 11/03/2016  . Cystitis 05/06/2015  . Degenerative cervical disc 04/08/2015  . Trigger point of neck 03/26/2015  . Pain in joint, ankle and foot 04/23/2013  . Visit for preventive health examination 02/13/2013  . Routine gynecological examination 02/13/2013  . Family history of breast  cancer in first degree relative 02/13/2013  . Rash and nonspecific skin eruption 02/13/2013  . 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 VITAMIN D DEFICIENCY 06/25/2008  . NECK PAIN 06/02/2008  . FOOT PAIN 06/02/2008  . Osteopenia 06/02/2008    Past Surgical History:  Procedure Laterality Date  . BUNIONECTOMY Right yrs ago  . CYSTOSCOPY W/ URETERAL STENT PLACEMENT Right 06/12/2020   Procedure: CYSTOSCOPY WITH RETROGRADE PYELOGRAM/URETERAL STENT EXCHANGE;  Surgeon: Alexis Frock, MD;  Location: [facility];  Service: Urology;  Laterality: Right;  . CYSTOSCOPY W/ URETERAL STENT PLACEMENT Right 01/13/2021   Procedure: CYSTOSCOPY WITH RETROGRADE PYELOGRAM/URETERAL STENT REMOVALRIGHT;  Surgeon: Alexis Frock, MD;  Location: [facility];  Service: Urology;  Laterality: Right;  45 MINS  . CYSTOSCOPY WITH RETROGRADE PYELOGRAM, URETEROSCOPY AND STENT PLACEMENT Right 01/28/2020   Procedure: CYSTOSCOPY WITH RETROGRADE PYELOGRAM, URETEROSCOPY AND STENT PLACEMENT;  Surgeon: Alexis Frock, MD;  Location: [facility];  Service: Urology;  Laterality: Right;  . IR GENERIC HISTORICAL  11/14/2016   IR US GUIDE VASC ACCESS RIGHT 11/14/2016 [facility]  . IR GENERIC HISTORICAL  11/14/2016   IR FLUORO GUIDE CV LINE RIGHT 11/14/2016 [facility]  . IR GENERIC HISTORICAL  12/12/2016   IR US GUIDE VASC ACCESS RIGHT 12/12/2016 [name], MD [facility]  . IR GENERIC HISTORICAL  12/12/2016   IR FLUORO GUIDE CV LINE RIGHT 12/12/2016 [name], MD [facility]  . SKIN CANCER EXCISION  1990   bowens disease     OB History   No obstetric history on file.    Obstetric Comments  Last mammogram: 09/2018, BIRADS 1 Bone density: 07/2018 Last pap: 2018 - negative, HPV +        Family History  Problem Relation Age of Onset  . Lung cancer Mother        lung  . Breast cancer Mother 82  . Ovarian cancer Mother 25  . Pancreatitis Father   . Breast cancer Sister 78  . Breast cancer Maternal  Grandmother 34       breast   . Breast cancer Maternal Aunt 75       breast  . Melanoma Sister   . Breast cancer Sister 109  . Non-Hodgkin's lymphoma Paternal Grandmother     Social History   Tobacco Use  . Smoking status: Former Smoker    Packs/day: 1.00    Years: 25.00    Pack years: 25.00    Types: Cigarettes    Quit date: 06/20/1995    Years since quitting: 25.6  . Smokeless tobacco: Never Used  Vaping Use  . Vaping Use: Never used  Substance Use Topics  . Alcohol use: Not Currently    Alcohol/week: 0.0 standard drinks    Comment: occasional wine  . Drug use: Yes    Types: Marijuana    Comment: 01-12-2021  per pt once a week    Home Medications Prior to Admission medications   Medication Sig Start Date End Date Taking? Authorizing Provider  acetaminophen (TYLENOL) 500 MG tablet Take 500 mg by mouth in the morning and at bedtime.   Yes [provider]  calcium-vitamin D (OSCAL WITH D) 500-200 MG-UNIT tablet Take 1 tablet by mouth daily.    Yes [provider]  conjugated estrogens (PREMARIN) vaginal cream Place 1 Applicatorful vaginally daily.   Yes [provider]  levothyroxine (SYNTHROID) 50 MCG tablet TAKE 1 TABLET EVERY DAY Patient taking differently: Take 50 mcg by mouth daily before breakfast. 12/17/20  Yes Panosh, Standley Brooking, MD  Methenamine-Sodium Salicylate (CYSTEX PO) Take 1 tablet by mouth daily.   Yes [provider]  Multiple Vitamins-Minerals (MULTIVITAMIN ADULT) CHEW Chew 1 capsule by mouth daily.   Yes [provider]  MYRBETRIQ 50 MG TB24 tablet Take 50 mg by mouth daily. 01/19/21  Yes [provider]  OVER THE COUNTER MEDICATION Place 1 drop into both eyes daily as needed (dry eyes).   Yes [provider]  Turmeric 500 MG CAPS Take 500 mg by mouth daily.   Yes [provider]  traMADol (ULTRAM) 50 MG tablet Take 1-2 tablets (50-100 mg total) by mouth every 6 (six) hours as needed for  moderate pain (post-operatively). Patient not taking: No sig reported 01/13/21 01/13/22  Alexis Frock, MD    Allergies    Benadryl [diphenhydramine], Citrus, Melatonin, Other, Lemon oil, and Penicillins  Review of Systems   Review of Systems  Genitourinary: Positive for flank pain.  All other systems reviewed and are negative.   Physical Exam Updated Vital Signs BP 120/64 (BP Location: Left Arm)   Pulse 84   Temp 97.7 F (36.5 C) (Oral)   Resp 18   SpO2 92%   Physical Exam Vitals and nursing note reviewed.  Constitutional:      Appearance: She is well-developed.     Comments: Non toxic in NAD  HENT:     Head: Normocephalic and atraumatic.     Nose: Nose normal.  Eyes:     Conjunctiva/sclera: Conjunctivae normal.  Cardiovascular:     Rate and Rhythm: Normal rate and regular rhythm.     Pulses:          Radial pulses are 1+ on the right side and 1+ on the left side.       Dorsalis pedis pulses are 1+ on the right side and 1+ on the left side.  Pulmonary:     Effort: Pulmonary effort is normal.     Breath sounds: Normal breath sounds.  Abdominal:     General: Bowel sounds are normal.     Palpations: Abdomen is soft.     Tenderness: There is no abdominal tenderness.     Comments: No G/R/R. No suprapubic or CVA tenderness. Negative Murphy's and McBurney's. Active BS to lower quadrants.   Musculoskeletal:        General: Normal range of motion.     Cervical back: Normal range of motion.     Lumbar back: Tenderness present.       Back:     Comments: Skin normal over lumbar/thoracic back. No midline TL spine tenderness. No CVAT. Reproducible right paraspinal lumbar muscle tenderness.   Skin:    General: Skin is warm and dry.     Capillary Refill: Capillary refill takes less than 2 seconds.  Neurological:     Mental Status: She is alert.     Comments: Sensation and strength intact in upper/lower extremities   Psychiatric:        Behavior: Behavior normal.     ED  Results / Procedures / Treatments   Labs (all labs ordered  are listed, but only abnormal results are displayed) Labs Reviewed  COMPREHENSIVE METABOLIC PANEL - Abnormal; Notable for the following components:      Result Value   Sodium 134 (*)    Glucose, Bld 103 (*)    Creatinine, Ser 1.12 (*)    Albumin 3.3 (*)    GFR, Estimated 53 (*)    All other components within normal limits  CBC WITH DIFFERENTIAL/PLATELET - Abnormal; Notable for the following components:   Hemoglobin 10.6 (*)    HCT 34.1 (*)    Platelets 417 (*)    All other components within normal limits  URINALYSIS, ROUTINE W REFLEX MICROSCOPIC - Abnormal; Notable for the following components:   Color, Urine STRAW (*)    Hgb urine dipstick MODERATE (*)    Leukocytes,Ua MODERATE (*)    All other components within normal limits  MAGNESIUM - Abnormal; Notable for the following components:   Magnesium 2.5 (*)    All other components within normal limits    EKG None  Radiology US Renal  Result Date: 01/23/2021 CLINICAL DATA:  Right flank pain. EXAM: RENAL / URINARY TRACT ULTRASOUND COMPLETE COMPARISON:  None. FINDINGS: Right Kidney: Renal measurements: 11.3 cm x 5.7 cm x 7.2 cm = volume: 245 mL. Echogenicity within normal limits. There is moderate severity right-sided hydronephrosis. No mass is visualized. Left Kidney: Renal measurements: 10.1 cm x 6.6 cm x 5.2 cm = volume: 184 mL. Echogenicity within normal limits. No mass or hydronephrosis visualized. Bladder: Appears normal for degree of bladder distention. Other: None. IMPRESSION: Moderate severity right-sided hydronephrosis without visualization of obstructing renal calculi. Electronically Signed   By: Virgina Norfolk M.D.   On: 01/23/2021 18:23    Procedures Procedures   Medications Ordered in ED Medications  oxyCODONE-acetaminophen (PERCOCET/ROXICET) 5-325 MG per tablet 1 tablet (1 tablet Oral Given 01/23/21 1717)    ED Course  I have reviewed the triage vital  signs and the nursing notes.  Pertinent labs & imaging results that were available during my care of the patient were reviewed by me and considered in my medical decision making (see chart for details).  Clinical Course as of 01/23/21 1923  Sat Jan 23, 2021  1642 Hemoglobin(!): 10.6 [CG]  1642 WBC: 6.9 [CG]  1643 Creatinine(!): 1.12 [CG]  1643 GFR, Estimated(!): 53 [CG]  1716 Chalmers Guest): MODERATE [CG]  1716 WBC, UA: 21-50 [CG]  1716 Bacteria, UA: NONE SEEN [CG]  1918 Spoke to Dr Jeffie Pollock - states given stable appearance of hydro, creatinine, no urinary symptoms no need for further emergent work up or intervention. Stent could be considered however appears urology would like to avoid this given previous intolerance. Recommends OP follow up with urology.  [CG]    Clinical Course User Index [CG] Arlean Hopping   MDM Rules/Calculators/A&P                          EMR triage and nursing notes reviewed  70 yo F here for acute on chronic right flank pain. She is concerned because she was told she had a "nodule" between her right kidney and bladder in last CT scan.   Last hospitalization 4/27-5/3 for atypical pneumonia s/p azithromycin. Previous hospitalization 4/21-4/27 for klebsiella bacteremia, pyelonephritis, obstructive uropathy with right hydrnephrosis s/p IV ertapenem and right ureteral stent that was removed 4/27.  CT scan on 4/30 revealed "new soft tissue density between IVC and right psoas muscle" concern for recurrent lymphadenopathy.  Patient has reproducible right lumbar muscular tenderness. No fever, CVAT. No changes in urine output.   Will start with labs, UA. Will give percocet, reassess. Renal US. Will consider CT renal based on results.   1920: labs and imaging reviewed and interpreted by me  Labs reveal - stable creatinine. Normal WBC. UA actually looks better than previous - no bacteria, moderate leukocytes, 21-50 WBC.    Imaging reveals - stable right  hydronephrosis  Patient re-evaluated, pain tolerable.  I spoke with Dr Roni Bread, see above.   Will discharge with tylenol, close urology follow up. No need for further emergent intervention given normal labs, stable hydro, normal urine output.  Return precautions given. Patient in agreement with POC. Discussed with EDP Karle Starch.   Final Clinical Impression(s) / ED Diagnoses Final diagnoses:  Flank pain  Hydronephrosis of right kidney    Rx / DC Orders ED Discharge Orders    None       Arlean Hopping 01/23/21 1923    Truddie Hidden, MD 01/23/21 2040

## 2021-01-26 ENCOUNTER — Other Ambulatory Visit: Payer: Self-pay

## 2021-01-27 ENCOUNTER — Encounter: Payer: Self-pay | Admitting: Internal Medicine

## 2021-01-27 ENCOUNTER — Ambulatory Visit (INDEPENDENT_AMBULATORY_CARE_PROVIDER_SITE_OTHER): Payer: Medicare HMO | Admitting: Internal Medicine

## 2021-01-27 VITALS — BP 110/70 | HR 79 | Temp 97.8°F | Ht 62.0 in | Wt 167.4 lb

## 2021-01-27 DIAGNOSIS — Z79899 Other long term (current) drug therapy: Secondary | ICD-10-CM | POA: Diagnosis not present

## 2021-01-27 DIAGNOSIS — Z85048 Personal history of other malignant neoplasm of rectum, rectosigmoid junction, and anus: Secondary | ICD-10-CM

## 2021-01-27 DIAGNOSIS — D649 Anemia, unspecified: Secondary | ICD-10-CM | POA: Diagnosis not present

## 2021-01-27 DIAGNOSIS — N1330 Unspecified hydronephrosis: Secondary | ICD-10-CM

## 2021-01-27 DIAGNOSIS — R109 Unspecified abdominal pain: Secondary | ICD-10-CM | POA: Diagnosis not present

## 2021-01-27 DIAGNOSIS — C210 Malignant neoplasm of anus, unspecified: Secondary | ICD-10-CM | POA: Diagnosis not present

## 2021-01-27 DIAGNOSIS — G8929 Other chronic pain: Secondary | ICD-10-CM | POA: Diagnosis not present

## 2021-01-27 LAB — BASIC METABOLIC PANEL
BUN: 23 mg/dL (ref 6–23)
CO2: 25 mEq/L (ref 19–32)
Calcium: 9.4 mg/dL (ref 8.4–10.5)
Chloride: 99 mEq/L (ref 96–112)
Creatinine, Ser: 1.04 mg/dL (ref 0.40–1.20)
GFR: 54.1 mL/min — ABNORMAL LOW (ref >60.00)
Glucose, Bld: 88 mg/dL (ref 70–99)
Potassium: 4.4 mEq/L (ref 3.5–5.1)
Sodium: 134 mEq/L — ABNORMAL LOW (ref 135–145)

## 2021-01-27 LAB — CBC WITH DIFFERENTIAL/PLATELET
Basophils Absolute: 0.1 10*3/uL (ref 0.0–0.1)
Basophils Relative: 0.9 % (ref 0.0–3.0)
Eosinophils Absolute: 0.1 10*3/uL (ref 0.0–0.7)
Eosinophils Relative: 2.2 % (ref 0.0–5.0)
HCT: 34.5 % — ABNORMAL LOW (ref 36.0–46.0)
Hemoglobin: 11.7 g/dL — ABNORMAL LOW (ref 12.0–15.0)
Lymphocytes Relative: 14.1 % (ref 12.0–46.0)
Lymphs Abs: 0.9 10*3/uL (ref 0.7–4.0)
MCHC: 33.8 g/dL (ref 30.0–36.0)
MCV: 83.1 fl (ref 78.0–100.0)
Monocytes Absolute: 0.5 10*3/uL (ref 0.1–1.0)
Monocytes Relative: 8.5 % (ref 3.0–12.0)
Neutro Abs: 4.6 10*3/uL (ref 1.4–7.7)
Neutrophils Relative %: 74.3 % (ref 43.0–77.0)
Platelets: 380 10*3/uL (ref 150.0–400.0)
RBC: 4.15 Mil/uL (ref 3.87–5.11)
RDW: 15.6 % — ABNORMAL HIGH (ref 11.5–15.5)
WBC: 6.2 10*3/uL (ref 4.0–10.5)

## 2021-01-27 LAB — MAGNESIUM: Magnesium: 2.3 mg/dL (ref 1.5–2.5)

## 2021-01-27 NOTE — Patient Instructions (Signed)
Lab today .   I will  Involved dr Benay Spice team about  Working through the  Nodule and consider second opinion about the hydronephrosis and pain.  Consider other scanning imaging  I will be out for  2 weeks  But will try to get   The ball rolling. Forward

## 2021-01-27 NOTE — Progress Notes (Signed)
Anemia slightly better kidney function stable magnesium normal f:orwarding information to Dr. Benay Spice who plans on seeing you later this week

## 2021-01-27 NOTE — Progress Notes (Signed)
Chief Complaint  Patient presents with  . Hospitalization Follow-up    HPI: Ashley Moore 70 y.o. come in for  fu hospital initially in [place] for "sepsis" pneumonia and pyelonephritis with antibiotics and continued right lower flank back pain. She was then hospitalized at [facility] as follow-up and continued treatment with antibiotics.  She has hydronephrosis of the right kidney that is persisted despite stenting and removal. She has a history of anal cancer with recurrence treated.  And has been stable. She has had some difficulty and follow-up response with urology she has had 3 separate stents 1 for renal stones the others not. She states that the right lower flank back pain feels better if she lays down and is worse when she sitting for a while but does not radiate. No current fever UTI or hematuria. She reports there was a "nodule" on the right side near her kidney that was noted on some of her scans.  Undefined at this time.  Social her sister there has been helping take care of her and taking her to visit he does have other supports driving ROS: See pertinent positives and negatives per HPI.  Past Medical History:  Diagnosis Date  . Arthritis   . Bowen's disease    excised 1992  . Chronic low back pain   . Chronic radiation cystitis   . DDD (degenerative disc disease), cervical   . Family history of breast cancer   . Family history of lung cancer   . Family history of non-Hodgkin's lymphoma   . Family history of ovarian cancer   . Headache   . Hx of varicella   . Hydronephrosis of right kidney    urologist--- dr Tresa Moore,  malignant,  treated with ureter stent  . Hypothyroidism    followed by pcp  . Lower urinary tract symptoms (LUTS)    01-12-2021  per pt treated with accupuncture at dr Tresa Moore office  . Lymphedema of both lower extremities   . PICC (peripherally inserted central catheter) in place 01/11/2021   placed at [facility] in [place], [place]  for IV antibiotics  . Positive blood culture 01/08/2021   Klebsiella Pneumaniae,  treated with daily IV antibiotic  . 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 Sepsis due to Klebsiella pneumoniae [facility]) 01/07/2021   pt admitted to [facility] in [place],  dx sepsis secondary to acute pyelonephritis with bacteremia , positive blood culture,  discharged 01-11-2021 home daily IV antibiotic  . Wears glasses     Family History  Problem Relation Age of Onset  . Lung cancer Mother        lung  . Breast cancer Mother 57  . Ovarian cancer Mother 46  . Pancreatitis Father   . Breast cancer Sister 66  . Breast cancer Maternal Grandmother 45       breast   . Breast cancer Maternal Aunt 75       breast  . Melanoma Sister   . Breast cancer Sister 49  . Non-Hodgkin's lymphoma Paternal Grandmother     Social History   Socioeconomic History  . Marital status: Widowed    Spouse name: Not on file  . Number of children:  0  . Years of education: 16  . Highest education level: Associate degree: academic program  Occupational History  . 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 Smoking status: Former Smoker    Packs/day: 1.00    Years: 25.00    Pack years: 25.00    Types: Cigarettes    Quit date: 06/20/1995    Years since quitting: 25.6  . Smokeless tobacco: Never Used  Vaping Use  . Vaping Use: Never used  Substance and Sexual Activity  . Alcohol use: Not Currently    Alcohol/week: 0.0 standard drinks    Comment: occasional wine  . Drug use: Yes    Types: Marijuana    Comment: 01-12-2021  per pt once a week  .  Sexual activity: Not on file  Other Topics Concern  . Not on file  Social History Narrative   H H  of 1      5 pets.   She is a former smoker   Retired Programmer, applications; Conservator, museum/gallery   [employer]   Red wine  1 per night.    Tea green tea and earl gray    Moved from [place] to [place] area in [place]Jan[place]1984   1 pregnancy   Husband died spring  2016 cv   Sister died 03-14-2015  Bone cancer    3 remaining sisters            Social Determinants of Health   Financial Resource Strain: Low Risk   . Difficulty of Paying Living Expenses: Not hard at all  Food Insecurity: No Food Insecurity  . Worried About Charity fundraiser in the Last Year: Never true  . Ran Out of Food in the Last Year: Never true  Transportation Needs: No Transportation Needs  . Lack of Transportation (Medical): No  . Lack of Transportation (Non-Medical): No  Physical Activity: Sufficiently Active  . Days of Exercise per Week: 5 days  . Minutes of Exercise per Session: 30 min  Stress: No Stress Concern Present  . Feeling of Stress : Not at all  Social Connections: Moderately Isolated  . Frequency of Communication with Friends and Family: More than three times a week  . Frequency of Social Gatherings with Friends and Family: More than three times a week  . Attends Religious Services: 1 to 4 times per year  . Active Member of Clubs or Organizations: No  . Attends Archivist Meetings: Never  . Marital Status: Widowed    Outpatient Medications Prior to Visit  Medication Sig Dispense Refill  . acetaminophen (TYLENOL) 500 MG tablet Take 500 mg by mouth in the morning and at bedtime.    Marland Kitchen azithromycin (ZITHROMAX) 500 MG tablet Take by mouth daily.    . calcium-vitamin D (OSCAL WITH D) 500-200 MG-UNIT tablet Take 1 tablet by mouth daily.     Marland Kitchen conjugated estrogens (PREMARIN) vaginal cream Place 1 Applicatorful vaginally daily.    Marland Kitchen levothyroxine (SYNTHROID) 50 MCG  tablet TAKE 1 TABLET EVERY DAY (Patient taking differently: Take 50 mcg by mouth daily before breakfast.) 90 tablet 0  . Methenamine-Sodium Salicylate (CYSTEX PO) Take 1 tablet by mouth daily.    . Multiple Vitamins-Minerals (MULTIVITAMIN ADULT) CHEW Chew 1 capsule by mouth daily.    Marland Kitchen MYRBETRIQ 50 MG TB24 tablet Take 50 mg by mouth daily.    Marland Kitchen OVER THE COUNTER MEDICATION Place  1 drop into both eyes daily as needed (dry eyes).    . Turmeric 500 MG CAPS Take 500 mg by mouth daily.    . traMADol (ULTRAM) 50 MG tablet Take 1-2 tablets (50-100 mg total) by mouth every 6 (six) hours as needed for moderate pain (post-operatively). (Patient not taking: No sig reported) 15 tablet 0   No facility-administered medications prior to visit.     EXAM:  BP 110/70 (BP Location: Left Arm, Patient Position: Sitting, Cuff Size: Normal)   Pulse 79   Temp 97.8 F (36.6 C) (Oral)   Ht 5\' 2"  (1.575 m)   Wt 167 lb 6.4 oz (75.9 kg)   SpO2 99%   BMI 30.62 kg/m   Body mass index is 30.62 kg/m.  GENERAL: vitals reviewed and listed above, alert, oriented, appears well hydrated and in no acute distress she is mildly anxious but appears well oriented sometimes will lay down flat to relieve her pain but otherwise is fine and walking normally. HEENT: atraumatic, conjunctiva  clear, no obvious abnormalities on inspection of external nose and ears OP : Masked NECK: no obvious masses on inspection palpation  LUNGS: clear to auscultation bilaterally, no wheezes, rales or rhonchi, good air movement CV: HRRR, no clubbing cyanosis or  peripheral edema nl cap refill  Abdomen soft without obvious organomegaly or masses area of concern pain but not reproducible is at the right lower above the lumbar area lower flank no midline tenderness skin changes. MS: moves all extremities without noticeable focal  abnormality PSYCH: pleasant and cooperative emotional at times discussing  the 2 hospitalizations where she was very  sick. Lab Results  Component Value Date   WBC 6.2 01/27/2021   HGB 11.7 (L) 01/27/2021   HCT 34.5 (L) 01/27/2021   PLT 380.0 01/27/2021   GLUCOSE 88 01/27/2021   CHOL 153 01/08/2020   TRIG 66.0 01/08/2020   HDL 76.30 01/08/2020   LDLCALC 63 01/08/2020   ALT 28 01/23/2021   AST 29 01/23/2021   NA 134 (L) 01/27/2021   K 4.4 01/27/2021   CL 99 01/27/2021   CREATININE 1.04 01/27/2021   BUN 23 01/27/2021   CO2 25 01/27/2021   TSH 2.84 04/17/2020   INR 1.2 01/13/2021   HGBA1C 5.5 05/11/2015   BP Readings from Last 3 Encounters:  01/27/21 110/70  01/23/21 110/64  01/19/21 (!) 142/80   There is new soft tissue density interposed between the IVC and right psoas muscle on image 38/2, measuring approximately 2.3 x 1.5 cm. Lymphadenopathy cannot be excluded. This is at the level of apparent obstruction of the right ureter.  ASSESSMENT AND PLAN:  Discussed the following assessment and plan:  Chronic right flank pain - Plan: Basic metabolic panel, CBC with Differential/Platelet, Magnesium, POCT Urinalysis Dipstick (Automated), Urine Culture, Urine Culture, POCT Urinalysis Dipstick (Automated), Magnesium, CBC with Differential/Platelet, Basic metabolic panel  Anemia, unspecified type - Plan: Basic metabolic panel, CBC with Differential/Platelet, Magnesium, POCT Urinalysis Dipstick (Automated), POCT Urinalysis Dipstick (Automated), Magnesium, CBC with Differential/Platelet, Basic metabolic panel  Medication management - Plan: Basic metabolic panel, CBC with Differential/Platelet, Magnesium, POCT Urinalysis Dipstick (Automated), POCT Urinalysis Dipstick (Automated), Magnesium, CBC with Differential/Platelet, Basic metabolic panel  History of anal cancer - Plan: Basic metabolic panel, CBC with Differential/Platelet, Magnesium, POCT Urinalysis Dipstick (Automated), POCT Urinalysis Dipstick (Automated), Magnesium, CBC with Differential/Platelet, Basic metabolic panel  Hydronephrosis,  unspecified hydronephrosis type - Plan: Basic metabolic panel, CBC with Differential/Platelet, Magnesium, POCT Urinalysis Dipstick (Automated), Urine Culture, Urine Culture,  POCT Urinalysis Dipstick (Automated), Magnesium, CBC with Differential/Platelet, Basic metabolic panel  Anal cancer (HCC) Posthospitalization for pneumonia and pyelonephritis recent removal of stent right ureter.  With persistent hydronephrosis. Ongoing significant right lower flank upper back side pain relieved by laying that becomes very severe at times. Reported nodule or area in the right psoas on one of the imaging studies that is unclear of follow-up. Uncertain cause of the pain although in the past has had Pyelo  and renal stones. Consider better imaging MRI ?   Will involve Dr. Learta Codding invite his input also. It sounds like she will need a new urology team consult based on discordant uncertain care at this time.  And no follow-up advised.  I Contacted Dr. Benay Spice  By ehr and phone and he will contact her to see her in the office in follow-up.this week.  And help more evaluation. She aware I will be out of the office for 2 weeks.  Other providers can help if she needs [place] in the interim. -Patient advised to return or notify health care team  if  new concerns arise.  Patient Instructions  Lab today .   I will  Involved dr Benay Spice team about  Working through the  Nodule and consider second opinion about the hydronephrosis and pain.  Consider other scanning imaging  I will be out for  2 weeks  But will try to get   The ball rolling. Forward     [place] K. Ashley Bultema M.D.

## 2021-01-28 LAB — URINE CULTURE
MICRO NUMBER:: 11877327
SPECIMEN QUALITY:: ADEQUATE

## 2021-01-29 ENCOUNTER — Other Ambulatory Visit: Payer: Self-pay

## 2021-01-29 ENCOUNTER — Inpatient Hospital Stay: Payer: Medicare HMO | Attending: Nurse Practitioner | Admitting: Oncology

## 2021-01-29 VITALS — BP 134/75 | HR 94 | Temp 97.9°F | Resp 18 | Ht 62.0 in | Wt 167.6 lb

## 2021-01-29 DIAGNOSIS — N898 Other specified noninflammatory disorders of vagina: Secondary | ICD-10-CM | POA: Insufficient documentation

## 2021-01-29 DIAGNOSIS — Z923 Personal history of irradiation: Secondary | ICD-10-CM | POA: Insufficient documentation

## 2021-01-29 DIAGNOSIS — I899 Noninfective disorder of lymphatic vessels and lymph nodes, unspecified: Secondary | ICD-10-CM | POA: Insufficient documentation

## 2021-01-29 DIAGNOSIS — C210 Malignant neoplasm of anus, unspecified: Secondary | ICD-10-CM | POA: Insufficient documentation

## 2021-01-29 NOTE — Progress Notes (Signed)
[place] OFFICE PROGRESS NOTE   Diagnosis: Anal cancer  INTERVAL HISTORY:   Ashley Moore is here prior to a scheduled visit.  She reports having intermittent diarrhea over the month prior to hospital admission in [place] on 01/07/2021.  She had an episode of diarrhea on the evening prior to hospital admission and then developed fever and weakness the following day.  She was admitted and diagnosed with acute pyelonephritis.  She had signs of sepsis and was treated with intravenous antibiotics.  A urine culture and blood cultures returned positive for Klebsiella pneumonia.  She was treated with 1 week of IV Invanz via a PICC.  She was discharged home on 01/11/2021. Her last stent exchange had been approximately 6 months prior to hospital admission.  She removal of the right ureter stent on 01/13/2021 by Dr. Tresa Moore.  A pyelogram revealed minimal caliectasis without frank hydronephrosis. She developed a fever of 102 degrees and presented to the emergency room on 01/13/2021.  She was admitted and placed on intravenous antibiotics.  A renal ultrasound revealed moderate right hydronephrosis.  She was started on azithromycin for possible atypical pneumonia.  She  completed a course of IV Invanz on 01/18/2021.  A CT of the abdomen and pelvis on 01/16/2021 revealed recurrent right-sided hydronephrosis and proximal right hydroureter.  A new soft tissue nodule was noted at the course of the right ureter between the right psoas and IVC.  This is at the course of the obstruction of the right ureter.  Stable circumferential rectal wall thickening.  She was discharged on 01/19/2021.  She has discomfort in the right flank region.  She takes Tylenol as needed.  No other complaint.  Objective:  Vital signs in last 24 hours:  Blood pressure 134/75, pulse 94, temperature 97.9 F (36.6 C), temperature source Oral, resp. rate 18, height 5\' 2"  (1.575 m), weight 167 lb 9.6 oz (76 kg), SpO2 100 %.     Lymphatics: No cervical, supraclavicular, axillary, or inguinal nodes Resp: Lungs clear bilaterally Cardio: Regular rate and rhythm GI: No hepatosplenomegaly, no mass, mild edema in the suprapubic and groin area bilaterally Vascular: No leg edema   Lab Results:  Lab Results  Component Value Date   WBC 6.2 01/27/2021   HGB 11.7 (L) 01/27/2021   HCT 34.5 (L) 01/27/2021   MCV 83.1 01/27/2021   PLT 380.0 01/27/2021   NEUTROABS 4.6 01/27/2021    CMP  Lab Results  Component Value Date   NA 134 (L) 01/27/2021   K 4.4 01/27/2021   CL 99 01/27/2021   CO2 25 01/27/2021   GLUCOSE 88 01/27/2021   BUN 23 01/27/2021   CREATININE 1.04 01/27/2021   CALCIUM 9.4 01/27/2021   PROT 7.7 01/23/2021   ALBUMIN 3.3 (L) 01/23/2021   AST 29 01/23/2021   ALT 28 01/23/2021   ALKPHOS 100 01/23/2021   BILITOT 0.5 01/23/2021   GFRNONAA 53 (L) 01/23/2021   GFRAA >60 06/17/2020    Lab Results  Component Value Date   CEA1 1.08 11/14/2016     Medications: I have reviewed the patient's current medications.   Assessment/Plan: 1. Anal cancer ? CT abdomen/pelvis 10/21/2016-thickening of the anus extending to the junction between the anus and rectum with a large stool ball in the rectum and mild fat stranding posterior to the rectum. Enlarged lymph nodes in the right and left inguinal regions. A few mildly prominent nodesseen posterior to the rectum. ? 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 ?  PET scan 43/32/[id number] hypermetabolic anal mass with hypermetabolic metastases to the groin region bilaterally, left pelvic sidewall and presacral space. ? Initiation of radiation and cycle 1 5-FU/mitomycin C 11/14/2016 ? Cycle 2 5-FU/mitomycin C 12/12/2016 (5-FU dose reduced due to mucositis, diarrhea, skin breakdown) ? Radiation completed 12/23/2016 ? CT abdomen/pelvis 04/14/2017-resolution of anal mass and bilateral inguinal lymphadenopathy. No residual tumor seen. ? CT  abdomen/pelvis 01/28/2020-right hydroureteronephrosis, transition at the level of the mid right ureter, retroperitoneal lymphadenopathy ? CT renal stone study 01/28/2020-severe right hydronephrosis and proximal right hydroureter, 8 mm area of increased attenuation in the proximal right ureter-stone versus soft tissue mass ? PET 02/13/2020-hypermetabolic right retroperitoneal nodal metastases in the aortocaval and posterior pericaval chains, nonspecific mild anal wall hypermetabolism, right nephroureteral stent ? 02/21/2020 anorectal exam per Dr. Ashley Jacobs radiation changes of the skin around the anal margin. Posterior midline smooth scarring. Mildly stenotic. Stenosis seems better. No palpable concerning lesions. Entire anal canal and distal rectum feels smooth and healthy. Partial anoscopy performed with no lesions in the anal canal. ? Xeloda/radiation beginning 03/02/2020, completed 04/13/2020 ? CT abdomen/pelvis 06/17/2020-resolution of right retroperitoneal lymphadenopathy, no remaining pathologically enlarged lymph nodes, residual mild right hydronephrosis, no evidence of progressive disease, stable anal wall thickening ? Digital rectal exam by Dr. Quentin Cornwall 08/26/2020-posterior midline smooth scarring.  Mildly stenotic.  Stenosis seems better.  No palpable concerning lesions.  Anal canal feels smooth without palpable concern.  Unable to tolerate anoscopy.  Next visit in 6 months for surveillance. ? CT abdomen/pelvis 10/28/2020-no evidence of local recurrence or metastatic disease.  Stable mild rectal wall thickening.  New diffuse bladder wall thickening possibly related to prior radiation ? CT abdomen/pelvis 01/16/2021- recurrent right-sided hydronephrosis and proximal right hydroureter status post removal of right ureter stent, new soft tissue nodule along the course of the right ureter between the right psoas and IVC suspicious for recurrent lymphadenopathy 2. Left labial lesions. Question direct  extension from the anal cancer versus metastatic disease from anal cancer versus a separate malignant process. 3. History of pain and bleeding secondary to #1 and skin breakdown 4. 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 5. Multiple family members with breast cancer. 6. History of hypokalemia-likely secondary to decreased nutritional intake and diarrhea; potassium in normal range 01/16/2017. No longer taking a potassium supplement. 7. Right hydroureteronephrosis on CT 01/28/2020, status post a cystoscopy/pyelogram 01/28/2020 confirming extrinsic compression of the right ureter, status post stent placement  Right ureter stent exchange 06/12/2020  Right ureter stent removed 01/13/2021  8.  Admission 01/07/2021 with Klebsiella pneumoniae urosepsis/bacteremia     Disposition: Ashley. Moore has a history of anal cancer.  She was treated with capecitabine and radiation beginning on 0/16/0109 for hypermetabolic adenopathy in the right retroperitoneum.  A CT in September 2021 revealed no pathologic enlarged lymph nodes.  She was admitted last month with urosepsis and underwent removal of the right ureter stent.  A CT 01/16/2021 reveals an apparent lymph node mass in the right retroperitoneum.  I reviewed the CT images with Ashley Moore.  The mass near the psoas and IVC is most likely indicative of progressive metastatic anal cancer.  We discussed the differential diagnosis and treatment plan.  She will be referred for a staging PET scan.  I will present her case at the GI tumor conference on 02/03/2021 to decide on the indication for a diagnostic biopsy.  Ashley. Moore will return for an office visit on 02/08/2021.  Betsy Coder, MD  01/29/2021  3:23 PM

## 2021-02-03 ENCOUNTER — Other Ambulatory Visit: Payer: Self-pay

## 2021-02-03 NOTE — Progress Notes (Signed)
The proposed treatment discussed in conference is for discussion purposes only and is not a binding recommendation.  The patients have not been physically examined, or presented with their treatment options.  Therefore, final treatment plans cannot be decided.   

## 2021-02-08 ENCOUNTER — Other Ambulatory Visit: Payer: Self-pay

## 2021-02-08 ENCOUNTER — Inpatient Hospital Stay: Payer: Medicare HMO | Admitting: Oncology

## 2021-02-08 VITALS — BP 134/78 | HR 85 | Temp 97.8°F | Resp 18 | Ht 62.0 in | Wt 172.8 lb

## 2021-02-08 DIAGNOSIS — I899 Noninfective disorder of lymphatic vessels and lymph nodes, unspecified: Secondary | ICD-10-CM | POA: Diagnosis not present

## 2021-02-08 DIAGNOSIS — Z923 Personal history of irradiation: Secondary | ICD-10-CM | POA: Diagnosis not present

## 2021-02-08 DIAGNOSIS — C210 Malignant neoplasm of anus, unspecified: Secondary | ICD-10-CM | POA: Diagnosis not present

## 2021-02-08 DIAGNOSIS — N898 Other specified noninflammatory disorders of vagina: Secondary | ICD-10-CM | POA: Diagnosis not present

## 2021-02-08 NOTE — Progress Notes (Signed)
[facility] OFFICE PROGRESS NOTE   Diagnosis: Anal cancer  INTERVAL HISTORY:   Ashley Moore returns as scheduled.  She continues to have discomfort at the right flank and right lower abdomen.  She takes Tylenol as needed for pain.  No fever.  No dysuria.  Objective:  Vital signs in last 24 hours:  Blood pressure 134/78, pulse 85, temperature 97.8 F (36.6 C), temperature source Oral, resp. rate 18, height 5\' 2"  (1.575 m), weight 172 lb 12.8 oz (78.4 kg), SpO2 100 %.     GI: Tender in the right lower abdomen, no mass Musculoskeletal: No tenderness at the right flank   Lab Results:  Lab Results  Component Value Date   WBC 6.2 01/27/2021   HGB 11.7 (L) 01/27/2021   HCT 34.5 (L) 01/27/2021   MCV 83.1 01/27/2021   PLT 380.0 01/27/2021   NEUTROABS 4.6 01/27/2021    CMP  Lab Results  Component Value Date   NA 134 (L) 01/27/2021   K 4.4 01/27/2021   CL 99 01/27/2021   CO2 25 01/27/2021   GLUCOSE 88 01/27/2021   BUN 23 01/27/2021   CREATININE 1.04 01/27/2021   CALCIUM 9.4 01/27/2021   PROT 7.7 01/23/2021   ALBUMIN 3.3 (L) 01/23/2021   AST 29 01/23/2021   ALT 28 01/23/2021   ALKPHOS 100 01/23/2021   BILITOT 0.5 01/23/2021   GFRNONAA 53 (L) 01/23/2021   GFRAA >60 06/17/2020     No results found.  Medications: I have reviewed the patient's current medications.   Assessment/Plan: 1. Anal cancer ? CT abdomen/pelvis 10/21/2016-thickening of the anus extending to the junction between the anus and rectum with a large stool ball in the rectum and mild fat stranding posterior to the rectum. Enlarged lymph nodes in the right and left inguinal regions. A few mildly prominent nodesseen posterior to the rectum. ? 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 ? PET scan 70/62/[id number] hypermetabolic anal mass with hypermetabolic metastases to the groin region bilaterally, left pelvic sidewall and presacral space. ? Initiation of  radiation and cycle 1 5-FU/mitomycin C 11/14/2016 ? Cycle 2 5-FU/mitomycin C 12/12/2016 (5-FU dose reduced due to mucositis, diarrhea, skin breakdown) ? Radiation completed 12/23/2016 ? CT abdomen/pelvis 04/14/2017-resolution of anal mass and bilateral inguinal lymphadenopathy. No residual tumor seen. ? CT abdomen/pelvis 01/28/2020-right hydroureteronephrosis, transition at the level of the mid right ureter, retroperitoneal lymphadenopathy ? CT renal stone study 01/28/2020-severe right hydronephrosis and proximal right hydroureter, 8 mm area of increased attenuation in the proximal right ureter-stone versus soft tissue mass ? PET 02/13/2020-hypermetabolic right retroperitoneal nodal metastases in the aortocaval and posterior pericaval chains, nonspecific mild anal wall hypermetabolism, right nephroureteral stent ? 02/21/2020 anorectal exam per Dr. Ashley Jacobs radiation changes of the skin around the anal margin. Posterior midline smooth scarring. Mildly stenotic. Stenosis seems better. No palpable concerning lesions. Entire anal canal and distal rectum feels smooth and healthy. Partial anoscopy performed with no lesions in the anal canal. ? Xeloda/radiation beginning 03/02/2020, completed 04/13/2020 ? CT abdomen/pelvis 06/17/2020-resolution of right retroperitoneal lymphadenopathy, no remaining pathologically enlarged lymph nodes, residual mild right hydronephrosis, no evidence of progressive disease, stable anal wall thickening ? Digital rectal exam by Dr. Quentin Cornwall 08/26/2020-posterior midline smooth scarring.  Mildly stenotic.  Stenosis seems better.  No palpable concerning lesions.  Anal canal feels smooth without palpable concern.  Unable to tolerate anoscopy.  Next visit in 6 months for surveillance. ? CT abdomen/pelvis 10/28/2020-no evidence of local recurrence or metastatic disease.  Stable  mild rectal wall thickening.  New diffuse bladder wall thickening possibly related to prior radiation ? CT  abdomen/pelvis 01/16/2021- recurrent right-sided hydronephrosis and proximal right hydroureter status post removal of right ureter stent, new soft tissue nodule along the course of the right ureter between the right psoas and IVC suspicious for recurrent lymphadenopathy 2. Left labial lesions. Question direct extension from the anal cancer versus metastatic disease from anal cancer versus a separate malignant process. 3. History of pain and bleeding secondary to #1 and skin breakdown 4. 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 5. Multiple family members with breast cancer. 6. History of hypokalemia-likely secondary to decreased nutritional intake and diarrhea; potassium in normal range 01/16/2017. No longer taking a potassium supplement. 7. Right hydroureteronephrosis on CT 01/28/2020, status post a cystoscopy/pyelogram 01/28/2020 confirming extrinsic compression of the right ureter, status post stent placement  Right ureter stent exchange 06/12/2020  Right ureter stent removed 01/13/2021  8.  Admission 01/07/2021 with Klebsiella pneumoniae urosepsis/bacteremia       Disposition: Ms. Keys has a history of anal cancer.  She was recently admitted with urosepsis secondary to obstruction of the right ureter.  She appears to have a lymph node mass adjacent to the right psoas and IVC causing the right ureter obstruction.  She understands the high likelihood is the mass is related to progression of anal cancer.  She is scheduled for a staging PET scan on 02/12/2021.  We will contact her with these results.  I presented her case at the GI tumor conference last week.  The only potentially curative therapy is surgical resection.  We will make referral to Dr. Cecil Cobbs to consider resection of the mass.  The mass is potentially amenable to percutaneous biopsy if needed.  I discussed the case with Dr. Tresa Moore.  He favors leaving the ureter stent out as she did  not tolerate the stent well.  Ms. Hoselton will return for an office visit as scheduled on 03/01/2021.  She will call for increased pain.  We will consider systemic chemotherapy and immunotherapy options if she is not a candidate for surgery.  Betsy Coder, MD  02/08/2021  2:42 PM

## 2021-02-09 ENCOUNTER — Encounter: Payer: Self-pay | Admitting: *Deleted

## 2021-02-09 NOTE — Progress Notes (Signed)
Faxed referral order, demographics and chart information to [facility] surgical oncology [phone]. Contacted radiology to push over last CT scans to [facility] system.

## 2021-02-12 ENCOUNTER — Encounter (HOSPITAL_COMMUNITY)
Admission: RE | Admit: 2021-02-12 | Discharge: 2021-02-12 | Disposition: A | Discharge status: Home / Self Care | Payer: Medicare HMO | Source: Ambulatory Visit | Attending: Oncology | Admitting: Oncology

## 2021-02-12 ENCOUNTER — Other Ambulatory Visit: Payer: Self-pay

## 2021-02-12 DIAGNOSIS — C7989 Secondary malignant neoplasm of other specified sites: Secondary | ICD-10-CM | POA: Diagnosis not present

## 2021-02-12 DIAGNOSIS — C210 Malignant neoplasm of anus, unspecified: Secondary | ICD-10-CM | POA: Insufficient documentation

## 2021-02-12 DIAGNOSIS — R911 Solitary pulmonary nodule: Secondary | ICD-10-CM | POA: Diagnosis not present

## 2021-02-12 LAB — GLUCOSE, CAPILLARY: Glucose-Capillary: 99 mg/dL (ref 70–99)

## 2021-02-12 MED ORDER — FLUDEOXYGLUCOSE F - 18 (FDG) INJECTION
8.6500 | Freq: Once | INTRAVENOUS | Status: AC | PRN
  Administered 2021-02-12: 8.65 via INTRAVENOUS

## 2021-02-16 ENCOUNTER — Telehealth: Payer: Self-pay | Admitting: *Deleted

## 2021-02-16 ENCOUNTER — Other Ambulatory Visit: Payer: Self-pay | Admitting: Oncology

## 2021-02-16 DIAGNOSIS — C210 Malignant neoplasm of anus, unspecified: Secondary | ICD-10-CM

## 2021-02-16 NOTE — Telephone Encounter (Addendum)
Patient called very distraught and crying. Wants to speak with Dr. Benay Spice regarding her prognosis. Informed her per Dr. Benay Spice that if node biopsy is positive for cancer, we will not be able to cure her,but we can get her into a long remission/control of disease, so death is not Administrator, arts.

## 2021-02-17 ENCOUNTER — Other Ambulatory Visit: Payer: Self-pay | Admitting: Physician Assistant

## 2021-02-18 ENCOUNTER — Encounter (HOSPITAL_COMMUNITY): Payer: Self-pay

## 2021-02-18 NOTE — Progress Notes (Unsigned)
[facility]    2       Reva Bores Female, 70 y.o., 09-Sep-1951  MRN:  [id number] Phone:  [phone] Jerilynn Mages)       PCP:  [name], MD Coverage:  [facility] Medicare/[employer] Medicare Hmo  Next Appt With Radiology ([facility]) 02/26/2021 at 1:00 PM           RE: Biopsy Received: Duaine Dredge, MD  Mykael Batz E  Ok   [place] core L supraclav or axillary LAN  See PET   DD[facility]        Previous Messages   ----- Message -----  From: Lenore Cordia  Sent: 02/17/2021  2:30 PM EDT  To: [facility] Procedure Requests  Subject: Biopsy                      Procedure Requested: [place] Core Biopsy (Lymph Nodes)    Reason for Procedure: History of anal cancer, hypermetabolic supraclavicular  and axillary nodes on PET    Provider Requesting: Dr Benay Spice  Provider Telephone: [phone]

## 2021-02-19 ENCOUNTER — Encounter: Payer: Self-pay | Admitting: *Deleted

## 2021-02-19 NOTE — Progress Notes (Signed)
Received notification from [facility] that patient declined surgical oncology referral when they called to schedule appointment.

## 2021-02-22 DIAGNOSIS — H9313 Tinnitus, bilateral: Secondary | ICD-10-CM | POA: Diagnosis not present

## 2021-02-22 DIAGNOSIS — H903 Sensorineural hearing loss, bilateral: Secondary | ICD-10-CM | POA: Diagnosis not present

## 2021-02-24 ENCOUNTER — Other Ambulatory Visit: Payer: Self-pay | Admitting: Radiology

## 2021-02-25 ENCOUNTER — Ambulatory Visit: Payer: Medicare HMO | Admitting: Oncology

## 2021-02-26 ENCOUNTER — Ambulatory Visit (HOSPITAL_COMMUNITY)
Admission: RE | Admit: 2021-02-26 | Discharge: 2021-02-26 | Disposition: A | Discharge status: Home / Self Care | Payer: Medicare HMO | Source: Ambulatory Visit | Attending: Oncology | Admitting: Oncology

## 2021-02-26 ENCOUNTER — Encounter (HOSPITAL_COMMUNITY): Payer: Self-pay

## 2021-02-26 ENCOUNTER — Other Ambulatory Visit: Payer: Self-pay

## 2021-02-26 DIAGNOSIS — N1330 Unspecified hydronephrosis: Secondary | ICD-10-CM | POA: Insufficient documentation

## 2021-02-26 DIAGNOSIS — Z85048 Personal history of other malignant neoplasm of rectum, rectosigmoid junction, and anus: Secondary | ICD-10-CM | POA: Diagnosis not present

## 2021-02-26 DIAGNOSIS — Z87891 Personal history of nicotine dependence: Secondary | ICD-10-CM | POA: Diagnosis not present

## 2021-02-26 DIAGNOSIS — R590 Localized enlarged lymph nodes: Secondary | ICD-10-CM | POA: Diagnosis not present

## 2021-02-26 DIAGNOSIS — C770 Secondary and unspecified malignant neoplasm of lymph nodes of head, face and neck: Secondary | ICD-10-CM | POA: Insufficient documentation

## 2021-02-26 DIAGNOSIS — Z79890 Hormone replacement therapy (postmenopausal): Secondary | ICD-10-CM | POA: Insufficient documentation

## 2021-02-26 DIAGNOSIS — Z803 Family history of malignant neoplasm of breast: Secondary | ICD-10-CM | POA: Diagnosis not present

## 2021-02-26 DIAGNOSIS — Z79899 Other long term (current) drug therapy: Secondary | ICD-10-CM | POA: Diagnosis not present

## 2021-02-26 DIAGNOSIS — Z801 Family history of malignant neoplasm of trachea, bronchus and lung: Secondary | ICD-10-CM | POA: Diagnosis not present

## 2021-02-26 DIAGNOSIS — Z791 Long term (current) use of non-steroidal anti-inflammatories (NSAID): Secondary | ICD-10-CM | POA: Insufficient documentation

## 2021-02-26 DIAGNOSIS — R911 Solitary pulmonary nodule: Secondary | ICD-10-CM | POA: Insufficient documentation

## 2021-02-26 DIAGNOSIS — C210 Malignant neoplasm of anus, unspecified: Secondary | ICD-10-CM | POA: Diagnosis not present

## 2021-02-26 DIAGNOSIS — Z807 Family history of other malignant neoplasms of lymphoid, hematopoietic and related tissues: Secondary | ICD-10-CM | POA: Insufficient documentation

## 2021-02-26 DIAGNOSIS — R591 Generalized enlarged lymph nodes: Secondary | ICD-10-CM | POA: Diagnosis present

## 2021-02-26 LAB — CBC WITH DIFFERENTIAL/PLATELET
Abs Immature Granulocytes: 0.02 10*3/uL (ref 0.00–0.07)
Basophils Absolute: 0 10*3/uL (ref 0.0–0.1)
Basophils Relative: 1 %
Eosinophils Absolute: 0.1 10*3/uL (ref 0.0–0.5)
Eosinophils Relative: 1 %
HCT: 37 % (ref 36.0–46.0)
Hemoglobin: 11.6 g/dL — ABNORMAL LOW (ref 12.0–15.0)
Immature Granulocytes: 0 %
Lymphocytes Relative: 11 %
Lymphs Abs: 0.6 10*3/uL — ABNORMAL LOW (ref 0.7–4.0)
MCH: 27.4 pg (ref 26.0–34.0)
MCHC: 31.4 g/dL (ref 30.0–36.0)
MCV: 87.5 fL (ref 80.0–100.0)
Monocytes Absolute: 0.5 10*3/uL (ref 0.1–1.0)
Monocytes Relative: 9 %
Neutro Abs: 4.3 10*3/uL (ref 1.7–7.7)
Neutrophils Relative %: 78 %
Platelets: 259 10*3/uL (ref 150–400)
RBC: 4.23 MIL/uL (ref 3.87–5.11)
RDW: 14.9 % (ref 11.5–15.5)
WBC: 5.5 10*3/uL (ref 4.0–10.5)
nRBC: 0 % (ref 0.0–0.2)

## 2021-02-26 LAB — PROTIME-INR
INR: 1.1 (ref 0.8–1.2)
Prothrombin Time: 14.1 seconds (ref 11.4–15.2)

## 2021-02-26 MED ORDER — MIDAZOLAM HCL 2 MG/2ML IJ SOLN
INTRAMUSCULAR | Status: AC | PRN
  Administered 2021-02-26 (×2): 1 mg via INTRAVENOUS

## 2021-02-26 MED ORDER — FENTANYL CITRATE (PF) 100 MCG/2ML IJ SOLN
INTRAMUSCULAR | Status: AC
  Filled 2021-02-26: qty 2

## 2021-02-26 MED ORDER — SODIUM CHLORIDE 0.9 % IV SOLN
INTRAVENOUS | Status: DC
Start: 2021-02-26 — End: 2021-02-27

## 2021-02-26 MED ORDER — FENTANYL CITRATE (PF) 100 MCG/2ML IJ SOLN
INTRAMUSCULAR | Status: AC | PRN
  Administered 2021-02-26 (×2): 50 ug via INTRAVENOUS

## 2021-02-26 MED ORDER — LIDOCAINE HCL 1 % IJ SOLN
INTRAMUSCULAR | Status: AC | PRN
  Administered 2021-02-26: 10 mL via INTRADERMAL

## 2021-02-26 MED ORDER — LIDOCAINE HCL 1 % IJ SOLN
INTRAMUSCULAR | Status: AC
  Filled 2021-02-26: qty 10

## 2021-02-26 MED ORDER — MIDAZOLAM HCL 2 MG/2ML IJ SOLN
INTRAMUSCULAR | Status: AC
  Filled 2021-02-26: qty 2

## 2021-02-26 NOTE — H&P (Signed)
Chief Complaint: Patient was seen in consultation today for lymph node biopsy   Referring Physician(s): Sherrill,Gary B  Supervising Physician: Aletta Edouard  Patient Status: [facility] - Out-pt  History of Present Illness: Ashley Moore is a 70 y.o. female with a medical history significant for anal cancer, first diagnosed in 2018. She has been treated with chemotherapy and radiation. She also has a history of right hydronephrosis requiring stent placement. The stent was removed 01/13/21 and she developed fever and chills after she returned home. She presented to the ED the night of 01/13/21 and was hospitalized for urosepsis secondary to obstruction of the right ureter. CT imaging during that hospitalization showed right-sided hydronephrosis and a new soft tissue nodule along the course of the right ureter.The patient had additional imaging after hospital discharge.  PET 02/12/21 IMPRESSION: 1. Small focus of hypermetabolism at the anorectal junction without a clearly definable mass on the CT portion of the examination. This may represent residual disease. 2. Right retroperitoneal nodal metastasis again noted, overall decreased compared to the prior study, but with obstruction of the proximal third of the right ureter and severe proximal right hydronephrosis. 3. Right middle lobe hypermetabolic pulmonary nodule likely to represent a metastatic lesion. Hypermetabolism in the right hilar region without clearly defined nodal mass suspicious for potential nodal metastasis as well. 4. Nodal metastatic disease in the left supraclavicular and left axillary regions, as above. 5. Nonspecific skeletal hypermetabolism most evident in the facet joints throughout the cervical and thoracic spine, nonspecific, but favored to be physiologic given the symmetry of the findings and lack of gross destructive lesions on CT images.  Interventional Radiology has been asked to evaluate this patient  for an image-guided lymph node biopsy for further work up. This case was reviewed and procedure approved by Dr. Vernard Gambles.   Past Medical History:  Diagnosis Date   Arthritis    Bowen's disease    excised 1992   Chronic low back pain    Chronic radiation cystitis    DDD (degenerative disc disease), cervical    Family history of breast cancer    Family history of lung cancer    Family history of non-Hodgkin's lymphoma    Family history of ovarian cancer    Headache    Hx of varicella    Hydronephrosis of right kidney    urologist--- dr Tresa Moore,  malignant,  treated with ureter stent   Hypothyroidism    followed by pcp   Lower urinary tract symptoms (LUTS)    01-12-2021  per pt treated with accupuncture at dr Tresa Moore office   Lymphedema of both lower extremities    PICC (peripherally inserted central catheter) in place 01/11/2021   placed at [facility] in [place], [place] for IV antibiotics   Positive blood culture 01/08/2021   Klebsiella Pneumaniae,  treated with daily IV antibiotic   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[facility]) oncologist--- dr Benay Spice   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,  invasive SCC , completed chemo/ radiation 12-23-2016;  recurrent metastatsis retroperitoneal lymphdenopathy,  completed radiation 04-13-2020 residual right ureteral uropathy obstruction   Retroperitoneal lymphadenopathy    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   Sepsis due to Klebsiella pneumoniae [facility]) 01/07/2021   pt admitted to [facility] in [place],  dx sepsis secondary to acute pyelonephritis with bacteremia , positive blood culture,  discharged 01-11-2021 home daily IV antibiotic   Wears glasses     Past Surgical History:  Procedure Laterality Date  BUNIONECTOMY Right yrs ago   [place] Right 06/12/2020   Procedure: CYSTOSCOPY WITH RETROGRADE PYELOGRAM/URETERAL STENT EXCHANGE;  Surgeon: Alexis Frock, MD;  Location: [facility];   Service: Urology;  Laterality: Right;   CYSTOSCOPY W/ URETERAL STENT PLACEMENT Right 01/13/2021   Procedure: CYSTOSCOPY WITH RETROGRADE PYELOGRAM/URETERAL STENT REMOVALRIGHT;  Surgeon: Alexis Frock, MD;  Location: [facility];  Service: Urology;  Laterality: Right;  [place], URETEROSCOPY AND STENT PLACEMENT Right 01/28/2020   Procedure: CYSTOSCOPY WITH RETROGRADE PYELOGRAM, URETEROSCOPY AND STENT PLACEMENT;  Surgeon: Alexis Frock, MD;  Location: [facility];  Service: Urology;  Laterality: Right;   IR GENERIC HISTORICAL  11/14/2016   IR US GUIDE VASC ACCESS RIGHT 11/14/2016 [facility]   IR GENERIC HISTORICAL  11/14/2016   IR FLUORO GUIDE CV LINE RIGHT 11/14/2016 [facility]   IR GENERIC HISTORICAL  12/12/2016   IR US GUIDE VASC ACCESS RIGHT 12/12/2016 [name], MD [facility]   IR GENERIC HISTORICAL  12/12/2016   IR FLUORO GUIDE CV LINE RIGHT 12/12/2016 [name], MD [facility]   SKIN CANCER EXCISION  1990   bowens disease    Allergies: Benadryl [diphenhydramine], Citrus, Melatonin, Other, Lemon oil, and Penicillins  Medications: Prior to Admission medications   Medication Sig Start Date End Date Taking? Authorizing Provider  calcium gluconate 500 MG tablet Take 1 tablet by mouth 3 (three) times daily. Takes 250 mg   Yes [provider]  Cholecalciferol (VITAMIN D3 PO) Take 2,000 Units by mouth daily.   Yes [provider]  conjugated estrogens (PREMARIN) vaginal cream Place 1 Applicatorful vaginally daily.   Yes [provider]  levothyroxine (SYNTHROID) 50 MCG tablet TAKE 1 TABLET EVERY DAY Patient taking differently: Take 50 mcg by mouth daily before breakfast. 12/17/20  Yes Panosh, Standley Brooking, MD  Methenamine-Sodium Salicylate (CYSTEX PO) Take 1 tablet by mouth daily.   Yes [provider]  Multiple Vitamins-Minerals (MULTIVITAMIN ADULT) CHEW Chew 1 capsule by mouth daily.   Yes [provider]  MYRBETRIQ 50 MG TB24 tablet Take 50 mg by mouth daily. 01/19/21  Yes [provider]  OVER THE COUNTER MEDICATION Place 1 drop into both eyes daily as needed (dry eyes).   Yes [provider]  OVER THE COUNTER MEDICATION Take 3 tablets by mouth daily. [facility] by Dr. Ardeth Perfect   Yes [provider]  traMADol (ULTRAM) 50 MG tablet Take 100 mg by mouth every 6 (six) hours as needed for moderate pain.   Yes [provider]  acetaminophen (TYLENOL) 500 MG tablet Take 500 mg by mouth in the morning and at bedtime.    [provider]  meloxicam (MOBIC) 15 MG tablet TAKE 1 TABLET EVERY DAY (APPOINTMENT IS NEEDED FOR REFILLS) 02/17/21   Persons, [name], PA  Turmeric 500 MG CAPS Take 500 mg by mouth daily.    [provider]     Family History  Problem Relation Age of Onset   Lung cancer Mother        lung   Breast cancer Mother 32   Ovarian cancer Mother 68   Pancreatitis Father    Breast cancer Sister 1   Breast cancer Maternal Grandmother 67       breast    Breast cancer Maternal Aunt 75       breast   Melanoma Sister    Breast cancer Sister 85   Non-Hodgkin's  lymphoma Paternal Grandmother     Social History   Socioeconomic History   Marital status: Widowed    Spouse name: Not on file   Number of children: 0   Years of education: 14   Highest education level: Associate degree: academic program  Occupational History   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   She is a former smoker   Retired Programmer, applications; Conservator, museum/gallery   [employer]   Red wine  1 per night.    Tea green tea and earl gray    Moved from [place] to [place] area in 12/12/83   1 pregnancy   Husband died spring  2016 cv   Sister died Mar 14, 2015  Bone cancer    3 remaining sisters            Social Determinants of Health   Financial Resource Strain: Low Risk    Difficulty of Paying Living Expenses: Not hard at all  Food Insecurity: No Food Insecurity   Worried About Charity fundraiser in the Last Year: Never true   Arboriculturist in the Last Year: Never true  Transportation Needs: No Transportation Needs   Lack of Transportation (Medical): No   Lack of Transportation (Non-Medical): No  Physical Activity: Sufficiently Active   Days of Exercise per Week: 5 days   Minutes of Exercise per Session: 30 min  Stress: No Stress Concern Present   Feeling of Stress : Not at all  Social Connections: Moderately Isolated   Frequency of Communication with Friends and Family: More than three times a week   Frequency of Social Gatherings with Friends and Family: More than three times a week   Attends Religious Services: 1 to 4 times per year   Active Member of [employer] or Organizations: No   Attends Archivist Meetings: Never   Marital Status: Widowed    Review of Systems: A 12 point ROS discussed and pertinent positives are indicated in the HPI above.  All other systems are negative.  Review of Systems  Constitutional:  Negative for fatigue and fever.  Respiratory:  Negative for cough and shortness of breath.   Cardiovascular:  Negative for chest pain and leg swelling.  Gastrointestinal:  Negative for abdominal pain, diarrhea, nausea and vomiting.  Genitourinary:  Negative for difficulty  urinating.  Musculoskeletal:  Positive for arthralgias.       States she fell approximately 6 weeks ago and now has right shoulder and knee pain.   Neurological:  Negative for dizziness and headaches.   Vital Signs: BP (!) 173/101 (BP Location: Right Arm)   Pulse Marland Kitchen)  102   Temp 97.8 F (36.6 C)   Resp 18   SpO2 100%   Physical Exam Constitutional:      General: She is not in acute distress. HENT:     Mouth/Throat:     Mouth: Mucous membranes are moist.     Pharynx: Oropharynx is clear.  Cardiovascular:     Rate and Rhythm: Normal rate and regular rhythm.     Pulses: Normal pulses.     Heart sounds: Normal heart sounds.  Pulmonary:     Effort: Pulmonary effort is normal.     Breath sounds: Normal breath sounds.  Abdominal:     General: Bowel sounds are normal.     Palpations: Abdomen is soft.  Skin:    General: Skin is warm and dry.  Neurological:     General: No focal deficit present.     Mental Status: She is alert and oriented to person, place, and time.    Imaging: NM PET Image Restag (PS) Skull Base To Thigh  Result Date: 02/13/2021 CLINICAL DATA:  Subsequent treatment strategy for anal cancer. EXAM: NUCLEAR MEDICINE PET SKULL BASE TO THIGH TECHNIQUE: 8.6 millicurie mCi N-86 FDG was injected intravenously. Full-ring PET imaging was performed from the skull base to thigh after the radiotracer. CT data was obtained and used for attenuation correction and anatomic localization. Fasting blood glucose: 99 mg/dl COMPARISON:  PET-CT 12/14/2019. FINDINGS: Mediastinal blood pool activity: SUV max 2.5 Liver activity: SUV max NA NECK: Multiple foci of hypermetabolism are noted associated with the facet joints in the cervical spine, nonspecific. Incidental CT findings: none CHEST: Supraclavicular lymph node measuring 8 mm (axial image 34 of series 4) is hypermetabolic (SUVmax = 5.3). More posteriorly there is another left supraclavicular lymph node (axial image 34 of series 4)  measuring 9 mm (SUVmax = 4.0) . Left axillary lymphadenopathy measuring 1.3 cm and 1.1 cm in short axis (axial image 39 of series 4 and 46 of series 4) are hypermetabolic (SUVmax = 76.7 and 12.7 respectively). Hypermetabolism in the right hilar region without a discrete lesion (SUVmax = 5.0) . 5 mm right middle lobe pulmonary nodule (axial image 63 of series 4) is hypermetabolic (SUVmax = 2.0) . Incidental CT findings: Heart size is normal. There is no significant pericardial fluid, thickening or pericardial calcification. There is aortic atherosclerosis, as well as atherosclerosis of the great vessels of the mediastinum and the coronary arteries, including calcified atherosclerotic plaque in the left main, left anterior descending and left circumflex coronary arteries. No acute consolidative airspace disease. No pleural effusions. ABDOMEN/PELVIS: In the right-side of the retroperitoneum posterolateral to the IVC and medial to the right psoas musculature (potentially partially invasive into the medial aspect of the right psoas musculature) there is a soft tissue attenuation mass (axial image 117 of series 4) measuring 4.1 x 2.7 cm which is diffusely hypermetabolic (SUVmax = 20.9). Small focus of hypermetabolism at the anorectal junction (SUVmax = 4.7) without well-defined mass. Incidental CT findings: Right-sided retroperitoneal mass appears intimately associated with the proximal third of the right ureter, and associated with this there is proximal severe hydronephrosis. Aortic atherosclerosis. SKELETON: Widespread relatively symmetric hypermetabolism throughout the facet joints in the cervical and thoracic spine, favored to be physiologic, as there are no destructive bony lesions to correlate to these findings on the CT images. No other discrete osseous lesions are confidently identified. Incidental CT findings: none IMPRESSION: 1. Small focus of hypermetabolism at the anorectal junction without a clearly  definable mass  on the CT portion of the examination. This may represent residual disease. 2. Right retroperitoneal nodal metastasis again noted, overall decreased compared to the prior study, but with obstruction of the proximal third of the right ureter and severe proximal right hydronephrosis. 3. Right middle lobe hypermetabolic pulmonary nodule likely to represent a metastatic lesion. Hypermetabolism in the right hilar region without clearly defined nodal mass suspicious for potential nodal metastasis as well. 4. Nodal metastatic disease in the left supraclavicular and left axillary regions, as above. 5. Nonspecific skeletal hypermetabolism most evident in the facet joints throughout the cervical and thoracic spine, nonspecific, but favored to be physiologic given the symmetry of the findings and lack of gross destructive lesions on CT images. Electronically Signed   By: Vinnie Langton M.D.   On: 02/13/2021 12:17    Labs:  CBC: Recent Labs    01/19/21 0353 01/23/21 1608 01/27/21 1454 02/26/21 1200  WBC 5.6 6.9 6.2 5.5  HGB 9.0* 10.6* 11.7* 11.6*  HCT 28.7* 34.1* 34.5* 37.0  PLT 263 417* 380.0 259    COAGS: Recent Labs    01/13/21 1941 02/26/21 1200  INR 1.2 1.1  APTT 33  --     BMP: Recent Labs    03/02/20 1002 03/13/20 1325 03/27/20 1405 06/12/20 1228 06/17/20 0906 10/28/20 0834 01/17/21 0449 01/18/21 0420 01/19/21 0353 01/23/21 1608 01/27/21 1454  NA 140 140 137   < > 137   < > 135 140 139 134* 134*  K 4.0 3.9 3.6   < > 4.1   < > 3.5 3.4* 4.4 4.2 4.4  CL 105 106 106   < > 104   < > 105 105 107 101 99  CO2 26 26 24   --  29   < > 24 25 23 23 25   GLUCOSE 89 106* 105*   < > 87   < > 103* 106* 101* 103* 88  BUN 15 14 11    < > 16   < > 14 12 16 22 23   CALCIUM 9.4 9.3 9.0  --  9.0   < > 8.1* 8.4* 8.4* 8.9 9.4  CREATININE 0.81 0.80 0.77   < > 0.70   < > 0.96 0.96 1.10* 1.12* 1.04  GFRNONAA >60 >60 >60  --  >60   < > >60 >60 54* 53*  --   GFRAA >60 >60 >60  --  >60  --    --   --   --   --   --    < > = values in this interval not displayed.    LIVER FUNCTION TESTS: Recent Labs    03/27/20 1405 06/17/20 0906 01/13/21 1941 01/15/21 0614 01/17/21 0449 01/18/21 0420 01/19/21 0353 01/23/21 1608  BILITOT 0.3 0.3 0.3  --   --   --   --  0.5  AST 25 23 52*  --   --   --   --  29  ALT 13 16 40  --   --   --   --  28  ALKPHOS 68 79 121  --   --   --   --  100  PROT 6.9 7.1 7.3  --   --   --   --  7.7  ALBUMIN 3.5 3.5 3.2*   < > 2.3* 2.4* 2.4* 3.3*   < > = values in this interval not displayed.    TUMOR MARKERS: No results for input(s): AFPTM, CEA, CA199, CHROMGRNA in the  last 8760 hours.  Assessment and Plan:  Anal cancer; lymphadenopathy: Ashley Moore, 70 year old female, presents today to the [facility] department for an image-guided lymph node biopsy.   Risks and benefits of this procedure were discussed with the patient and/or patient's family including, but not limited to bleeding, infection, damage to adjacent structures or low yield requiring additional tests.  All of the questions were answered and there is agreement to proceed. She has been NPO. Labs and vitals have been reviewed.   Consent signed and in chart.  Thank you for this interesting consult.  I greatly enjoyed meeting Ashley Moore and look forward to participating in their care.  A copy of this report was sent to the requesting provider on this date.  Electronically Signed: Soyla Dryer, AGACNP-BC [phone] 02/26/2021, 12:38 PM   I spent a total of  30 Minutes   in face to face in clinical consultation, greater than 50% of which was counseling/coordinating care for image-guided lymph node biopsy.

## 2021-02-26 NOTE — Discharge Instructions (Signed)
Urgent needs - Interventional Radiology on call MD [phone]  Wound - May remove dressing and shower in 24 to 48 hours.  Keep site clean and dry.  Replace with bandaid as needed.  Do not submerge in tub or water until site healing well. If closed with glue, glue will flake off on its own.

## 2021-02-26 NOTE — Procedures (Signed)
Interventional Radiology Procedure Note  Procedure: US Guided Biopsy of left supraclavicular lymph node  Complications: None  Estimated Blood Loss: < 10 mL  Findings: 22 G core biopsy of left supraclavicular LN performed under US guidance.  Four core samples obtained and sent to Pathology.  Venetia Night. Kathlene Cote, M.D Pager:  [phone]

## 2021-03-01 ENCOUNTER — Other Ambulatory Visit: Payer: Self-pay | Admitting: *Deleted

## 2021-03-01 ENCOUNTER — Other Ambulatory Visit: Payer: Self-pay

## 2021-03-01 ENCOUNTER — Inpatient Hospital Stay: Payer: Medicare HMO | Attending: Nurse Practitioner | Admitting: Oncology

## 2021-03-01 ENCOUNTER — Encounter: Payer: Self-pay | Admitting: *Deleted

## 2021-03-01 VITALS — BP 156/86 | HR 93 | Temp 97.8°F | Resp 18 | Ht 62.0 in | Wt 171.4 lb

## 2021-03-01 DIAGNOSIS — Z7189 Other specified counseling: Secondary | ICD-10-CM | POA: Diagnosis not present

## 2021-03-01 DIAGNOSIS — N1330 Unspecified hydronephrosis: Secondary | ICD-10-CM | POA: Insufficient documentation

## 2021-03-01 DIAGNOSIS — C778 Secondary and unspecified malignant neoplasm of lymph nodes of multiple regions: Secondary | ICD-10-CM | POA: Insufficient documentation

## 2021-03-01 DIAGNOSIS — Z5111 Encounter for antineoplastic chemotherapy: Secondary | ICD-10-CM | POA: Insufficient documentation

## 2021-03-01 DIAGNOSIS — Z923 Personal history of irradiation: Secondary | ICD-10-CM | POA: Diagnosis not present

## 2021-03-01 DIAGNOSIS — C210 Malignant neoplasm of anus, unspecified: Secondary | ICD-10-CM | POA: Insufficient documentation

## 2021-03-01 LAB — SURGICAL PATHOLOGY

## 2021-03-01 MED ORDER — LORAZEPAM 0.5 MG PO TABS: 0.5000 mg | ORAL_TABLET | Freq: Two times a day (BID) | ORAL | 0 refills | Status: DC | PRN

## 2021-03-01 NOTE — Progress Notes (Signed)
DISCONTINUE ON PATHWAY REGIMEN - Anal Carcinoma     Chemotherapy concurrent with RT:     Mitomycin        Dose Mod: None     5-Fluorouracil        Dose Mod: None  **Always confirm dose/schedule in your pharmacy ordering system**  REASON: Disease Progression PRIOR TREATMENT: ANLOS01: 5-Fluorouracil 1,000 mg/m2/day CIV D1-4, 29-32 + Mitomycin 10 mg/m2 D1, 29 + Concurrent Radiation Therapy TREATMENT RESPONSE: Partial Response (PR)  START ON PATHWAY REGIMEN - Anal Carcinoma     A cycle is every 28 days:     Paclitaxel      Carboplatin   **Always confirm dose/schedule in your pharmacy ordering system**  Patient Characteristics: Anal Canal Tumors, Distant Metastases / Local Recurrence - Unresectable, First Line Therapeutic Status: Distant Metastases Line of therapy: First Line  Intent of Therapy: Non-Curative / Palliative Intent, Discussed with Patient

## 2021-03-01 NOTE — Progress Notes (Signed)
[facility] OFFICE VISIT PROGRESS NOTE  I connected with Ashley Moore on 03/01/21 at 11:00 AM EDT by video enabled telemedicine visit and verified that I am speaking with the correct person using two identifiers.   I discussed the limitations, risks, security and privacy concerns of performing an evaluation and management service by telemedicine and the availability of in-person appointments. I also discussed with the patient that there may be a patient responsible charge related to this service. The patient expressed understanding and agreed to proceed.   Patient's location: Office Provider's location: Home   Diagnosis: Anal cancer  INTERVAL HISTORY:  Ashley Moore is seen for a scheduled visit.  She continues to have discomfort in the lower abdomen.  She has intermittent "bleeding ".  She is not sure whether this is coming from the urine or rectum.  Her pain has improved since removal of the ureter stent.  She is anxious.  I reviewed the PET results with her by telephone 2 weeks ago.  She underwent ultrasound-guided biopsy of a left supraclavicular lymph node on 02/26/2021.  She reports tolerating the procedure well.    Lab Results:  Lab Results  Component Value Date   WBC 5.5 02/26/2021   HGB 11.6 (L) 02/26/2021   HCT 37.0 02/26/2021   MCV 87.5 02/26/2021   PLT 259 02/26/2021   NEUTROABS 4.3 02/26/2021    Imaging:  No results found.  Medications: I have reviewed the patient's current medications.  Assessment/Plan:  Anal cancer CT abdomen/pelvis 10/21/2016-thickening of the anus extending to the junction between the anus and rectum with a large stool ball in the rectum and mild fat stranding posterior to the rectum. Enlarged lymph nodes in the right and left inguinal regions. A few mildly prominent nodes seen posterior to the rectum. 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 PET scan  [id number] hypermetabolic anal mass with hypermetabolic metastases to the groin region bilaterally, left pelvic sidewall and presacral space. Initiation of radiation and cycle 1 5-FU/mitomycin C 11/14/2016 Cycle 2 5-FU/mitomycin C 12/12/2016 (5-FU dose reduced due to mucositis, diarrhea, skin breakdown) Radiation completed 12/23/2016 CT abdomen/pelvis 04/14/2017-resolution of anal mass and bilateral inguinal lymphadenopathy. No residual tumor seen. CT abdomen/pelvis 01/28/2020-right hydroureteronephrosis, transition at the level of the mid right ureter, retroperitoneal lymphadenopathy CT renal stone study 01/28/2020-severe right hydronephrosis and proximal right hydroureter, 8 mm area of increased attenuation in the proximal right ureter-stone versus soft tissue mass PET 02/13/2020-hypermetabolic right retroperitoneal nodal metastases in the aortocaval and posterior pericaval chains, nonspecific mild anal wall hypermetabolism, right nephroureteral stent 02/21/2020 anorectal exam per Dr. Ashley Jacobs radiation changes of the skin around the anal margin.  Posterior midline smooth scarring.  Mildly stenotic.  Stenosis seems better.  No palpable concerning lesions.  Entire anal canal and distal rectum feels smooth and healthy.  Partial anoscopy performed with no lesions in the anal canal. Xeloda/radiation beginning 03/02/2020, completed 04/13/2020 CT abdomen/pelvis 06/17/2020-resolution of right retroperitoneal lymphadenopathy, no remaining pathologically enlarged lymph nodes, residual mild right hydronephrosis, no evidence of progressive disease, stable anal wall thickening Digital rectal exam by Dr. Quentin Cornwall 08/26/2020-posterior midline smooth scarring.  Mildly stenotic.  Stenosis seems better.  No palpable concerning lesions.  Anal canal feels smooth without palpable concern.  Unable to tolerate anoscopy.  Next visit in 6 months for surveillance. CT abdomen/pelvis 10/28/2020-no evidence of local  recurrence or metastatic disease.  Stable mild rectal wall thickening.  New diffuse bladder wall thickening possibly related to prior  radiation CT abdomen/pelvis 01/16/2021- recurrent right-sided hydronephrosis and proximal right hydroureter status post removal of right ureter stent, new soft tissue nodule along the course of the right ureter between the right psoas and IVC suspicious for recurrent lymphadenopathy PET 02/12/2021-small focus of hypermetabolism at the anorectal junction without a definable mass, right retroperitoneal nodal metastasis with obstruction of the proximal right ureter, right middle lobe hypermetabolic nodule, hypermetabolism in the right hilum without a defined nodal mass, hypermetabolic left supraclavicular and left axillary nodes 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 Question direct extension from the anal cancer versus metastatic disease from anal cancer versus a separate malignant process. 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 Multiple family members with breast cancer. History of hypokalemia-likely secondary to decreased nutritional intake and diarrhea; potassium in normal range 01/16/2017. No longer taking a potassium supplement. Right hydroureteronephrosis on CT 01/28/2020, status post a cystoscopy/pyelogram 01/28/2020 confirming extrinsic compression of the right ureter, status post stent placement Right ureter stent exchange 06/12/2020 Right ureter stent removed 01/13/2021  8.  Admission 01/07/2021 with Klebsiella pneumoniae urosepsis/bacteremia          Disposition: Ashley Moore has a history of anal cancer with clinical evidence of metastatic disease involving right retroperitoneal lymph nodes.  There is now PET evidence of distant metastatic disease with a biopsy of a left  supraclavicular lymph node on 02/26/2021 confirming metastatic anal cancer.  I discussed the diagnosis, prognosis, and treatment options with Ashley Moore.  We reviewed the PET and CT images from 02/12/2021.  She understands no therapy will be curative.  We discussed palliative treatment options including standard systemic chemotherapy, immunotherapy, and referral to consider a clinical trial.  I recommend proceeding with Taxol/carboplatin chemotherapy for 4 cycles prior to a restaging CT evaluation.  She agrees.  We reviewed potential toxicities associated with the paclitaxel/carboplatin chemotherapy regimen including the chance for nausea/vomiting, alopecia, and hematologic toxicity.  We discussed the chance of allergic reaction, bone pain, and neuropathy with paclitaxel.  We discussed the potential need for white cell growth factor support.  We reviewed the rash, bone pain, and splenic rupture associated with G-CSF.  She agrees to proceed.  The plan is to begin paclitaxel/carboplatin on a day 1 every 3-week schedule within the next 1-2 weeks.  We will switch to weekly paclitaxel dosing depending on tolerance.  We will recommend a trial of a PD-1 inhibitor if there is progressive disease on paclitaxel/carboplatin.  A chemotherapy plan was entered today.   I discussed the assessment and treatment plan with the patient. The patient was provided an opportunity to ask questions and all were answered. The patient agreed with the plan and demonstrated an understanding of the instructions.   The patient was advised to call back or seek an in-person evaluation if the symptoms worsen or if the condition fails to improve as anticipated.  I provided 50 minutes of chart review, video, and documentation time during this encounter, and > 50% was spent counseling as documented under my assessment & plan.  A chemotherapy plan was entered today.  Betsy Coder ANP/GNP-BC   03/01/2021 10:55 AM

## 2021-03-01 NOTE — Progress Notes (Signed)
Per Dr. Benay Spice request: Provided handouts on Taxol and Carboplatin. Patient declines a formal education session, but agrees to allow RN to call her this week to discuss treatment and she will try to see if her sister can be present to listen in as well. She was very tearful today due to being surprised by fact that her anal cancer is now metastatic. Sat with her and allowed her to cry and vent her fears. Reminded her that she been faced with difficulties in the past and to call upon that strength she has used in the past to get through it. We will help her as well all we can. RN will call her tomorrow to f/u on how she wishes to discuss the treatment and potential side effects. Provided print out of Taxol and Carboplatin to read at home. Walked patient out to her Lucianne Lei.

## 2021-03-01 NOTE — Progress Notes (Signed)
Placed CSW referral for anxiety and dealing with metastatic cancer.

## 2021-03-02 ENCOUNTER — Encounter: Payer: Self-pay | Admitting: General Practice

## 2021-03-02 ENCOUNTER — Telehealth: Payer: Self-pay | Admitting: Pharmacist

## 2021-03-02 NOTE — Chronic Care Management (AMB) (Signed)
Date- Patient called to remind of appointment with Watt Climes on 03/03/2021 at 1:00 pm  Patient aware of appointment date, time, and type of appointment (telephone ). Patient aware to have/bring all medications, supplements, blood pressure and/or blood sugar logs to visit.  Questions: Have you had any recent office visit or specialist visit outside of [facility]? No Are there any concerns you would like to discuss during your office visit? No Are you having any problems obtaining your medications? (Whether it pharmacy issues or cost) No If patient has any PAP medications ask if they are having any problems getting their PAP medication or refill? No  Star Rating Drug:  None  Any gaps in medications fill history? No  Maia Breslow, [facility] Pharmacist Assistant [phone]

## 2021-03-02 NOTE — Progress Notes (Signed)
[facility] CSW Progress Notes  Urgent referral received from Dionne Milo, RN for emotional support for patient.  Called patient, no answer, left generic VM w my contact information and encouragement to call back.  Will try again.  Edwyna Shell, LCSW Clinical Social Worker Phone:  [phone]

## 2021-03-02 NOTE — Progress Notes (Signed)
[facility] CSW Progress Notes  Patient returned call, discussed any possible needs for support/resources.  She was understandably upset by news of metastatic cancer yesterday, her reactions appear to be normal and expected.  She is working today, expressed appreciation for support.  She will call us as needed for resources going forward.  Briefly explained Patient and [facility] and encouraged her to contact us as needed.  Edwyna Shell, LCSW Clinical Social Worker Phone:  [phone]

## 2021-03-03 ENCOUNTER — Encounter: Payer: Self-pay | Admitting: Physician Assistant

## 2021-03-03 ENCOUNTER — Ambulatory Visit: Payer: Self-pay

## 2021-03-03 ENCOUNTER — Ambulatory Visit (INDEPENDENT_AMBULATORY_CARE_PROVIDER_SITE_OTHER): Payer: Medicare HMO | Admitting: Pharmacist

## 2021-03-03 ENCOUNTER — Ambulatory Visit (INDEPENDENT_AMBULATORY_CARE_PROVIDER_SITE_OTHER): Payer: Medicare HMO | Admitting: Physician Assistant

## 2021-03-03 ENCOUNTER — Other Ambulatory Visit: Payer: Self-pay

## 2021-03-03 DIAGNOSIS — M79601 Pain in right arm: Secondary | ICD-10-CM | POA: Diagnosis not present

## 2021-03-03 DIAGNOSIS — M25511 Pain in right shoulder: Secondary | ICD-10-CM | POA: Diagnosis not present

## 2021-03-03 DIAGNOSIS — M810 Age-related osteoporosis without current pathological fracture: Secondary | ICD-10-CM

## 2021-03-03 DIAGNOSIS — R7989 Other specified abnormal findings of blood chemistry: Secondary | ICD-10-CM

## 2021-03-03 NOTE — Progress Notes (Signed)
Chronic Care Management Pharmacy Note  03/18/2021 Name:  Ashley Moore MRN:  [id number] DOB:  Dec 21, 1950  Summary: Patient reports some uncontrolled joint pain  Recommendations/Changes made from today's visit: -Recommended avoidance of NSAIDs and use of Tylenol, diclofenac gel or capsaicin cream  Plan: Follow up in 6 months   Subjective: Ashley Moore is an 70 y.o. year old female who is a primary patient of Panosh, Standley Brooking, MD.  The CCM team was consulted for assistance with disease management and care coordination needs.    Engaged with patient by telephone for follow up visit in response to provider referral for pharmacy case management and/or care coordination services.   Consent to Services:  The patient was given information about Chronic Care Management services, agreed to services, and gave verbal consent prior to initiation of services.  Please see initial visit note for detailed documentation.   Patient Care Team: Panosh, Standley Brooking, MD as PCP - General Inda Castle, MD (Inactive) as Attending Physician (Gastroenterology) [name], MD as Consulting Physician (Oncology) Kyung Rudd, MD as Consulting Physician (Radiation Oncology) Pieter Partridge, DO as Consulting Physician (Neurology) [name], NP as Nurse Practitioner (Nurse Practitioner) Marcy Siren, MD as Referring Physician (Obstetrics and Gynecology) Viona Gilmore, [facility] as Pharmacist (Pharmacist)  Recent office visits: 01/29/21 Shanon Ace, MD: Patient presented for hospitalization follow up for sepsis.  12/10/20 Ofilia Neas, LPN: Patient presented for AWV.  11/25/20 Shanon Ace, MD: Patient presented for hearing problem. Referral placed to ENT.  Recent consult visits: 03/01/21 Betsy Coder, MD (oncology): Patient presented for anal cancer follow up. Plan for paclitaxel and carboplatin regimen for metastatic growth to lymph nodes.  02/22/21 Pietro Cassis, PA-C  (ENT): Patient presented for initial consult for hearing loss.  02/08/21 Betsy Coder, MD (oncology): Patient presented for anal cancer follow up. Lymph node mass was found when she was admitted with urosepsis. Referred to Dr. Cecil Cobbs for consideration of surgical resection of the mass.  12/10/20 Alexis Frock, MD (urology): Unable to access notes.  12/09/20 Marcy Siren, MD (pelvic health center): Patient presented for OAB.  10/30/20 Ned Card, NP (oncology): Patient presented for anal cancer follow up.   Hospital visits: 01/23/21 Patient presented to the ED with flank pain. Recommended urology follow up.   4/27-01/19/21 Patient admitted for urosepsis. Patient had ureteral stent removed on 4/27 and developed fever, chills and dry cough after she returned home that prompted her to come to ED.  4/21-4/25/22 Patient admitted with pyelonephritis.   Objective:  Lab Results  Component Value Date   CREATININE 0.87 03/15/2021   BUN 17 03/15/2021   GFR 54.10 (L) 01/27/2021   GFRNONAA >60 03/15/2021   GFRAA >60 06/17/2020   NA 135 03/15/2021   K 4.1 03/15/2021   CALCIUM 9.8 03/15/2021   CO2 27 03/15/2021   GLUCOSE 94 03/15/2021    Lab Results  Component Value Date/Time   HGBA1C 5.5 05/11/2015 11:18 AM   GFR 54.10 (L) 01/27/2021 02:54 PM   GFR 81.29 01/08/2020 10:08 AM    Last diabetic Eye exam: No results found for: HMDIABEYEEXA  Last diabetic Foot exam: No results found for: HMDIABFOOTEX   Lab Results  Component Value Date   CHOL 153 01/08/2020   HDL 76.30 01/08/2020   LDLCALC 63 01/08/2020   TRIG 66.0 01/08/2020   CHOLHDL 2 01/08/2020    Hepatic Function Latest Ref Rng & Units 03/15/2021 03/04/2021 01/23/2021  Total Protein 6.5 - 8.1 g/dL 7.2  7.4 7.7  Albumin 3.5 - 5.0 g/dL 3.9 3.7 3.3(L)  AST 15 - 41 U/L $Remo'27 19 29  'kxgPp$ ALT 0 - 44 U/L $Remo'15 9 28  'fFSuE$ Alk Phosphatase 38 - 126 U/L 56 59 100  Total Bilirubin 0.3 - 1.2 mg/dL 0.5 0.2(L) 0.5  Bilirubin, Direct 0.0 - 0.3 mg/dL - - -     Lab Results  Component Value Date/Time   TSH 2.84 04/17/2020 01:28 PM   TSH 7.46 (H) 01/08/2020 10:08 AM   FREET4 1.3 04/17/2020 01:28 PM   FREET4 0.75 01/08/2020 10:08 AM    CBC Latest Ref Rng & Units 03/15/2021 03/04/2021 02/26/2021  WBC 4.0 - 10.5 K/uL 4.4 4.1 5.5  Hemoglobin 12.0 - 15.0 g/dL 10.9(L) 10.1(L) 11.6(L)  Hematocrit 36.0 - 46.0 % 34.9(L) 32.1(L) 37.0  Platelets 150 - 400 K/uL 222 275 259    Lab Results  Component Value Date/Time   VD25OH 48 08/10/2010 09:31 PM   VD25OH 46 11/11/2008 10:55 PM    Clinical ASCVD: No  The 10-year ASCVD risk score Mikey Bussing DC Jr., et al., 2013) is: 13.7%   Values used to calculate the score:     Age: 1 years     Sex: Female     Is Non-Hispanic African American: No     Diabetic: No     Tobacco smoker: No     Systolic Blood Pressure: 654 mmHg     Is BP treated: No     HDL Cholesterol: 76.3 mg/dL     Total Cholesterol: 153 mg/dL    Depression screen [facility] 2/9 12/10/2020 11/25/2020 09/25/2019  Decreased Interest 0 0 0  Down, Depressed, Hopeless 0 0 1  PHQ - 2 Score 0 0 1  Altered sleeping 0 0 -  Tired, decreased energy 0 0 -  Change in appetite 0 0 -  Feeling bad or failure about yourself  0 0 -  Trouble concentrating 0 0 -  Moving slowly or fidgety/restless 0 0 -  Suicidal thoughts 0 0 -  PHQ-9 Score 0 0 -  Difficult doing work/chores Not difficult at all Not difficult at all -  Some recent data might be hidden     Social History   Tobacco Use  Smoking Status Former   Packs/day: 1.00   Years: 25.00   Pack years: 25.00   Types: Cigarettes   Quit date: 06/20/1995   Years since quitting: 25.7  Smokeless Tobacco Never   BP Readings from Last 3 Encounters:  03/15/21 (!) 157/80  03/11/21 (!) 144/80  03/11/21 (!) 161/68   Pulse Readings from Last 3 Encounters:  03/15/21 80  03/11/21 92  03/11/21 (!) 111   Wt Readings from Last 3 Encounters:  03/11/21 172 lb (78 kg)  03/01/21 171 lb 6.4 oz (77.7 kg)  02/08/21 172 lb  12.8 oz (78.4 kg)   BMI Readings from Last 3 Encounters:  03/11/21 31.46 kg/m  03/01/21 31.35 kg/m  02/08/21 31.61 kg/m    Assessment/Interventions: Review of patient past medical history, allergies, medications, health status, including review of consultants reports, laboratory and other test data, was performed as part of comprehensive evaluation and provision of chronic care management services.   SDOH:  (Social Determinants of Health) assessments and interventions performed: No  SDOH Screenings   Alcohol Screen: Low Risk    Last Alcohol Screening Score (AUDIT): 2  Depression (PHQ2-9): Low Risk    PHQ-2 Score: 0  Financial Resource Strain: Low Risk    Difficulty of Paying  Living Expenses: Not hard at all  Food Insecurity: No Food Insecurity   Worried About Charity fundraiser in the Last Year: Never true   Ran Out of Food in the Last Year: Never true  Housing: Low Risk    Last Housing Risk Score: 0  Physical Activity: Sufficiently Active   Days of Exercise per Week: 5 days   Minutes of Exercise per Session: 30 min  Social Connections: Moderately Isolated   Frequency of Communication with Friends and Family: More than three times a week   Frequency of Social Gatherings with Friends and Family: More than three times a week   Attends Religious Services: 1 to 4 times per year   Active Member of [employer] or Organizations: No   Attends Archivist Meetings: Never   Marital Status: Widowed  Stress: No Stress Concern Present   Feeling of Stress : Not at all  Tobacco Use: Medium Risk   Smoking Tobacco Use: Former   Smokeless Tobacco Use: Never  Transportation Needs: No Data processing manager (Medical): No   Lack of Transportation (Non-Medical): No    CCM Care Plan  Allergies  Allergen Reactions   Benadryl [Diphenhydramine] Other (See Comments)    Tingle all over    Citrus Diarrhea   Melatonin     Tingle all over    Other Diarrhea     greenbeans and citrus   Lemon Oil Diarrhea   Penicillins Rash    Did it involve swelling of the face/tongue/throat, SOB, or low BP? N Did it involve sudden or severe rash/hives, skin peeling, or any reaction on the inside of your mouth or nose? Y Did you need to seek medical attention at a hospital or doctor's office? N When did it last happen? Almost 50 years Ago      If all above answers are "NO", may proceed with cephalosporin use.     Medications Reviewed Today     Reviewed by [name], NP (Nurse Practitioner) on 03/11/21 at [phone]  Med List Status: <None>   Medication Order Taking? Sig Documenting Provider Last Dose Status Informant  acetaminophen (TYLENOL) 500 MG tablet [id number] Yes Take 500 mg by mouth in the morning and at bedtime. [provider] Taking Active Self  calcium gluconate 500 MG tablet [id number] Yes Take 1 tablet by mouth 3 (three) times daily. Takes 250 mg [provider] Taking Active   Cholecalciferol (VITAMIN D3 PO) [id number] Yes Take 2,000 Units by mouth daily. [provider] Taking Active Self  conjugated estrogens (PREMARIN) vaginal cream [id number] Yes Place 1 Applicatorful vaginally daily. [provider] Taking Active Self  dexamethasone (DECADRON) 2 MG tablet [id number] Yes Take 5 tablets (10 mg total) by mouth as directed. Take 10 mg at 10 pm night before 1st Taxol treatment and repeat at 6 am day of 1st Taxol treatment [name], MD Taking Active   diclofenac Sodium (VOLTAREN) 1 % GEL [id number] Yes Apply topically 4 (four) times daily. To shoulder [provider] Taking Active   levothyroxine (SYNTHROID) 50 MCG tablet [id number] Yes TAKE 1 TABLET EVERY DAY Panosh, Standley Brooking, MD Taking Active   LORazepam (ATIVAN) 0.5 MG tablet [id number] Yes Take 1 tablet (0.5 mg total) by mouth every 12 (twelve) hours as needed for anxiety. [name], MD Taking Active   Misc Natural Products ([place])  [id number] Yes Take 1 capsule by mouth daily. [provider] Taking Active  Multiple Vitamins-Minerals (MULTIVITAMIN ADULT) CHEW [id number] Yes Chew 1 capsule by mouth daily. [provider] Taking Active Self  MYRBETRIQ 50 MG TB24 tablet [id number] Yes Take 50 mg by mouth daily. [provider] Taking Active Self  ondansetron (ZOFRAN) 8 MG tablet [id number] Yes Take 1 tablet (8 mg total) by mouth every 8 (eight) hours as needed for nausea or vomiting. Start 72 hours after IV chemotherapy treatment [name], MD Taking Active   OVER THE COUNTER MEDICATION [id number] Yes Place 1 drop into both eyes daily as needed (dry eyes). [provider] Taking Active Self  OVER THE COUNTER MEDICATION [id number] Yes Take 3 tablets by mouth daily. Total Health by Dr. Ardeth Perfect [provider] Taking Active Self           Med Note Landry Mellow, Meredith Mody   Fri Feb 26, 2021 12:07 PM) "Total restore"  prochlorperazine (COMPAZINE) 10 MG tablet [id number] Yes Take 1 tablet (10 mg total) by mouth every 6 (six) hours as needed for nausea. [name], MD Taking Active   traMADol Veatrice Bourbon) 50 MG tablet [id number]  Take 100 mg by mouth every 6 (six) hours as needed for moderate pain. [provider]  Active   Turmeric 500 MG CAPS [id number] Yes Take 500 mg by mouth daily. 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   Anal cancer ([place]) 11/03/2016   Cystitis 05/06/2015   Degenerative cervical disc 04/08/2015   Trigger point of neck 03/26/2015   Pain in joint, ankle and foot 04/23/2013   Visit for preventive health examination 02/13/2013   Routine gynecological examination 02/13/2013   Family history of breast cancer in first degree relative 02/13/2013   Rash and nonspecific skin eruption 02/13/2013   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   VITAMIN D DEFICIENCY 06/25/2008   NECK PAIN 06/02/2008   FOOT PAIN 06/02/2008   Osteopenia 06/02/2008    Immunization History  Administered Date(s) Administered   Fluad Quad(high Dose 65+) 05/23/2019, 06/18/2020   Influenza Whole 06/25/2008, 08/10/2010   Influenza, High Dose Seasonal PF 06/11/2015, 10/27/2016, 06/21/2017   Influenza,inj,Quad PF,6+ Mos 06/28/2018   PFIZER(Purple Top)SARS-COV-2 Vaccination 11/17/2019, 12/10/2019, 07/02/2020   Pneumococcal Conjugate-13 03/11/2015   Pneumococcal Polysaccharide-23 04/29/2016   Tdap 02/13/2013   Zoster, Live 02/13/2013    Conditions to be addressed/monitored:  Hypothyroidism, Osteoporosis, Overactive Bladder, and Pain  Care Plan : [place]  Updates made by Viona Gilmore, [place] since 03/18/2021 12:00 AM     Problem: Problem: Hypothyroidism, Osteoporosis, Overactive Bladder, and Pain      Long-Range Goal: Patient-Specific Goal   Start Date: 03/03/2021  Expected End Date: 03/03/2022  This Visit's Progress: On track  Priority: High  Note:   Current Barriers:  Unable to independently monitor therapeutic efficacy  Pharmacist Clinical Goal(s):  Patient will achieve adherence to monitoring guidelines and medication adherence to achieve  therapeutic efficacy through collaboration with PharmD and provider.   Interventions: 1:1 collaboration with Panosh, Standley Brooking, MD regarding development and update of comprehensive plan of care as evidenced by provider attestation and  co-signature Inter-disciplinary care team collaboration (see longitudinal plan of care) Comprehensive medication review performed; medication list updated in electronic medical record  Osteoporosis (Goal prevent fractures) -Not ideally controlled -Last DEXA Scan: 07/2018               T-Score femoral neck: RFN -2.7, LFN -2.4             T-Score total hip: n/a             T-Score lumbar spine: 1.3             T-Score forearm radius: n/a             10-year probability of major osteoporotic fracture: n/a             10-year probability of hip fracture: n/a -Patient is a candidate for pharmacologic treatment due to T-Score < -2.5 in femoral neck -Current treatment  Calcium-vitamin D 500-200 mg-unit 1 tablet daily Vitamin D 2000 units daily -Medications previously tried: none  -Recommend [phone] units of vitamin D daily. Recommend 1200 mg of calcium daily from dietary and supplemental sources. Recommend weight-bearing and muscle strengthening exercises for building and maintaining bone density. - Patient does not consume much calcium in her diet as she drinks almond milk, occasionally yogurt or cheese and no nuts or seeds  Hypothyroidism (Goal: TSH 0.35-4.5) -Controlled -Current treatment  Levothyroxine 50 mcg 1 tablet daily -Medications previously tried: none -Recommended to continue current medication  Anxiety (Goal: minimize symptoms) -Not ideally controlled -Current treatment  Lorazepem 0.5 mg as needed -Medications previously tried: none  -Counseled on limiting use due to sedation Patient is using around the clock  Overactive bladder (Goal: minimize symptoms) -Controlled -Current treatment  Myrbetriq 50 mg 1 tablet daily -Medications previously tried: none  -Recommended to continue current medication  Pain (Goal: minimize pain) -Uncontrolled -Current treatment  Tramadol 50 mg 1 tablet as needed Tylenol 500 mg 2 tablets daily -Medications previously tried: none   -Counseled on Using tramadol sparingly; maximum recommended dose of Tylenol 3,000 mg/day Recommended  diclofenac gel with lower risk of bleeding and pain control or capsaicin cream for joint pain  Health Maintenance -Vaccine gaps: shingles -Current therapy:  Turmeric 550 mg 1 capsule daily Premarin cream insert 1 applicatorful daily -Educated on Cost vs benefit of each product must be carefully weighed by individual consumer -Patient is satisfied with current therapy and denies issues -Recommended to continue current medication  Patient Goals/Self-Care Activities Patient will:  - take medications as prescribed target a minimum of 150 minutes of moderate intensity exercise weekly  Follow Up Plan: Telephone follow up appointment with care management team member scheduled for: 6 months      Medication Assistance: None required.  Patient affirms current coverage meets needs.  Compliance/Adherence/Medication fill history: Care Gaps: Shingrix, COVID booster, mammogram, colonoscopy  Star-Rating Drugs: None  Patient's preferred pharmacy is:  [id number] - [place], [place] [place] [address] Phone: [phone] Fax: [phone]  Uses pill box? No - has own system Pt endorses 100% compliance  We discussed: Current pharmacy is preferred with insurance plan and patient is satisfied with pharmacy services Patient decided to: Continue current medication management strategy  Care Plan and Follow  Up Patient Decision:  Patient agrees to Care Plan and Follow-up.  Plan: Telephone follow up appointment with care management team member scheduled for:  6 months  [name], PharmD, [place] Pharmacist [place] at [place] [phone]

## 2021-03-03 NOTE — Progress Notes (Signed)
Office Visit Note   Patient: Ashley Moore           Date of Birth: [birth year]           MRN: [id number] Visit Date: 03/03/2021              Requested by: [name], MD [place],  [place] [address] PCP: [name], MD  Chief Complaint  Patient presents with   Right Arm - Pain      HPI: Patient is a pleasant 70 year old woman who presents today with a chief complaint of right shoulder pain.  When she originally made this appointment she was going to have her knee injected.  She has been recently diagnosed with metastatic anal cancer and is undergoing chemotherapy and does not want to have any steroid injections at this time.  With regards to her shoulder she fell on her arm about 2-1/2 months ago.  She had quite a bit of bruising.  Both of her pain resolved with the exception of pain over the right shoulder.  She also finds it difficult to carry things with this arm.  She also notices decreased motion points to the posterior aspect of the glenohumeral joint as her main concern  Assessment & Plan: Visit Diagnoses:  1. Right arm pain     Plan: Discussed with the patient that I think this is most likely from a rotator cuff injury on top of some glenohumeral arthritis.  She does not want to go forward with an injection understandably.  We discussed trying physical therapy to improve her motion as well as topical Voltaren gel.  She would like to try this and we will go forward and order this  Follow-Up Instructions: No follow-ups on file.   Ortho Exam  Patient is alert, oriented, no adenopathy, well-dressed, normal affect, normal respiratory effort. Right shoulder clinical abnormality she does have some tenderness over the glenohumeral joint.  She has forward elevation to about 106 20 degrees resist abduction is painful.  She can only internally rotate to below her belt line.  Grip strength is good.  She has had a complete PET scan to evaluate her  metastases  Imaging: No results found. No images are attached to the encounter.  Labs: Lab Results  Component Value Date   HGBA1C 5.5 05/11/2015   REPTSTATUS 01/15/2021 FINAL 01/13/2021   CULT  01/13/2021    NO GROWTH Performed at [facility] [address] [place], [place] [address]      Lab Results  Component Value Date   ALBUMIN 3.3 (L) 01/23/2021   ALBUMIN 2.4 (L) 01/19/2021   ALBUMIN 2.4 (L) 01/18/2021    Lab Results  Component Value Date   MG 2.3 01/27/2021   MG 2.5 (H) 01/23/2021   MG 2.3 01/19/2021   Lab Results  Component Value Date   VD25OH 48 08/10/2010   VD25OH 46 11/11/2008    No results found for: PREALBUMIN CBC EXTENDED Latest Ref Rng & Units 02/26/2021 01/27/2021 01/23/2021  WBC 4.0 - 10.5 K/uL 5.5 6.2 6.9  RBC 3.87 - 5.11 MIL/uL 4.23 4.15 3.93  HGB 12.0 - 15.0 g/dL 11.6(L) 11.7(L) 10.6(L)  HCT 36.0 - 46.0 % 37.0 34.5(L) 34.1(L)  PLT 150 - 400 K/uL 259 380.0 417(H)  NEUTROABS 1.7 - 7.7 K/uL 4.3 4.6 5.2  LYMPHSABS 0.7 - 4.0 K/uL 0.6(L) 0.9 0.8     There is no height or weight on file to calculate  BMI.  Orders:  Orders Placed This Encounter  Procedures   XR Humerus Right   No orders of the defined types were placed in this encounter.    Procedures: No procedures performed  Clinical Data: No additional findings.  ROS:  All other systems negative, except as noted in the HPI. Review of Systems  Objective: Vital Signs: There were no vitals taken for this visit.  Specialty Comments:  No specialty comments available.  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   Anal cancer ([place]) 11/03/2016   Cystitis 05/06/2015   Degenerative cervical disc 04/08/2015   Trigger point of neck 03/26/2015   Pain in joint, ankle and foot 04/23/2013   Visit for preventive health examination 02/13/2013   Routine gynecological examination 02/13/2013   Family history of breast cancer in first degree relative 02/13/2013   Rash and nonspecific skin eruption 02/13/2013   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   VITAMIN D DEFICIENCY 06/25/2008   NECK PAIN 06/02/2008   FOOT PAIN 06/02/2008   Osteopenia 06/02/2008   Past Medical History:  Diagnosis Date   Arthritis    Bowen's disease    excised 1992   Chronic low back pain    Chronic radiation cystitis    DDD (degenerative disc disease), cervical    Family history of breast cancer    Family history of lung cancer    Family history of non-Hodgkin's lymphoma    Family history of ovarian cancer    Headache    Hx of varicella    Hydronephrosis of right kidney    urologist--- dr Tresa Moore,  malignant,  treated with ureter stent   Hypothyroidism    followed by pcp   Lower urinary tract symptoms (LUTS)    01-12-2021  per pt treated with accupuncture at dr Tresa Moore office   Lymphedema of both lower extremities    PICC (peripherally inserted central catheter) in place 01/11/2021   placed at [facility] in [place], [place] for IV antibiotics   Positive blood culture 01/08/2021   Klebsiella Pneumaniae,  treated with daily IV antibiotic   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[facility]) oncologist--- dr Benay Spice   dx 02/ 2018,  invasive SCC , completed chemo/ radiation 12-23-2016;  recurrent metastatsis retroperitoneal lymphdenopathy,  completed radiation 04-13-2020 residual right ureteral uropathy obstruction   Retroperitoneal lymphadenopathy    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  s/p radiation completed 07/ 2021   Sepsis due to Klebsiella pneumoniae ([place]) 01/07/2021   pt admitted to [facility] in [place],  dx sepsis secondary to acute pyelonephritis with bacteremia , positive blood culture,  discharged 01-11-2021 home daily IV antibiotic   Wears glasses     Family History  Problem Relation Age of Onset   Lung cancer Mother        lung   Breast cancer Mother 72   Ovarian cancer Mother 68   Pancreatitis Father    Breast cancer Sister 81   Breast cancer Maternal  Grandmother 77       breast    Breast cancer Maternal Aunt 75       breast   Melanoma Sister    Breast cancer Sister 20   Non-Hodgkin's lymphoma Paternal Grandmother     Past Surgical History:  Procedure Laterality Date   BUNIONECTOMY Right yrs ago   CYSTOSCOPY W/ URETERAL STENT PLACEMENT Right 06/12/2020   Procedure: CYSTOSCOPY WITH RETROGRADE PYELOGRAM/URETERAL STENT EXCHANGE;  Surgeon: Alexis Frock, MD;  Location: [facility];  Service: Urology;  Laterality: Right;   CYSTOSCOPY W/ URETERAL STENT PLACEMENT Right 01/13/2021   Procedure: CYSTOSCOPY WITH RETROGRADE PYELOGRAM/URETERAL STENT REMOVALRIGHT;  Surgeon: Alexis Frock, MD;  Location[facility];  Service: Urology;  Laterality: Right;  [place], URETEROSCOPY AND STENT PLACEMENT Right 01/28/2020   Procedure: CYSTOSCOPY WITH RETROGRADE PYELOGRAM, URETEROSCOPY AND STENT PLACEMENT;  Surgeon: Alexis Frock, MD;  Location: [facility];  Service: Urology;  Laterality: Right;   IR GENERIC HISTORICAL  11/14/2016   IR US GUIDE VASC ACCESS RIGHT 11/14/2016 [facility]   IR GENERIC HISTORICAL  11/14/2016   IR FLUORO GUIDE CV LINE RIGHT 11/14/2016 [facility]   IR GENERIC HISTORICAL  12/12/2016   IR US GUIDE VASC ACCESS RIGHT 12/12/2016 [name], MD [facility]   IR GENERIC HISTORICAL  12/12/2016   IR FLUORO GUIDE CV LINE RIGHT 12/12/2016 [name], MD [facility]    SKIN CANCER EXCISION  1990   bowens disease   Social History   Occupational History   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

## 2021-03-04 ENCOUNTER — Inpatient Hospital Stay: Payer: Medicare HMO

## 2021-03-04 DIAGNOSIS — C778 Secondary and unspecified malignant neoplasm of lymph nodes of multiple regions: Secondary | ICD-10-CM | POA: Diagnosis not present

## 2021-03-04 DIAGNOSIS — Z5111 Encounter for antineoplastic chemotherapy: Secondary | ICD-10-CM | POA: Diagnosis not present

## 2021-03-04 DIAGNOSIS — N1330 Unspecified hydronephrosis: Secondary | ICD-10-CM | POA: Diagnosis not present

## 2021-03-04 DIAGNOSIS — C210 Malignant neoplasm of anus, unspecified: Secondary | ICD-10-CM | POA: Diagnosis not present

## 2021-03-04 DIAGNOSIS — Z923 Personal history of irradiation: Secondary | ICD-10-CM | POA: Diagnosis not present

## 2021-03-04 LAB — CMP (CANCER CENTER ONLY)
ALT: 9 U/L (ref 0–44)
AST: 19 U/L (ref 15–41)
Albumin: 3.7 g/dL (ref 3.5–5.0)
Alkaline Phosphatase: 59 U/L (ref 38–126)
Anion gap: 6 (ref 5–15)
BUN: 18 mg/dL (ref 8–23)
CO2: 26 mmol/L (ref 22–32)
Calcium: 9.6 mg/dL (ref 8.9–10.3)
Chloride: 102 mmol/L (ref 98–111)
Creatinine: 0.92 mg/dL (ref 0.44–1.00)
GFR, Estimated: >60 mL/min (ref >60)
Glucose, Bld: 97 mg/dL (ref 70–99)
Potassium: 4.6 mmol/L (ref 3.5–5.1)
Sodium: 134 mmol/L — ABNORMAL LOW (ref 135–145)
Total Bilirubin: 0.2 mg/dL — ABNORMAL LOW (ref 0.3–1.2)
Total Protein: 7.4 g/dL (ref 6.5–8.1)

## 2021-03-04 LAB — CBC WITH DIFFERENTIAL (CANCER CENTER ONLY)
Abs Immature Granulocytes: 0 10*3/uL (ref 0.00–0.07)
Basophils Absolute: 0 10*3/uL (ref 0.0–0.1)
Basophils Relative: 1 %
Eosinophils Absolute: 0.1 10*3/uL (ref 0.0–0.5)
Eosinophils Relative: 3 %
HCT: 32.1 % — ABNORMAL LOW (ref 36.0–46.0)
Hemoglobin: 10.1 g/dL — ABNORMAL LOW (ref 12.0–15.0)
Immature Granulocytes: 0 %
Lymphocytes Relative: 16 %
Lymphs Abs: 0.7 10*3/uL (ref 0.7–4.0)
MCH: 27.2 pg (ref 26.0–34.0)
MCHC: 31.5 g/dL (ref 30.0–36.0)
MCV: 86.5 fL (ref 80.0–100.0)
Monocytes Absolute: 0.4 10*3/uL (ref 0.1–1.0)
Monocytes Relative: 11 %
Neutro Abs: 2.8 10*3/uL (ref 1.7–7.7)
Neutrophils Relative %: 69 %
Platelet Count: 275 10*3/uL (ref 150–400)
RBC: 3.71 MIL/uL — ABNORMAL LOW (ref 3.87–5.11)
RDW: 15 % (ref 11.5–15.5)
WBC Count: 4.1 10*3/uL (ref 4.0–10.5)
nRBC: 0 % (ref 0.0–0.2)

## 2021-03-04 LAB — CEA (ACCESS): CEA (CHCC): 1.16 ng/mL (ref 0.00–5.00)

## 2021-03-04 MED ORDER — PROCHLORPERAZINE MALEATE 10 MG PO TABS: 10.0000 mg | ORAL_TABLET | Freq: Four times a day (QID) | ORAL | 1 refills | Status: DC | PRN

## 2021-03-04 MED ORDER — ONDANSETRON HCL 8 MG PO TABS: 8.0000 mg | ORAL_TABLET | Freq: Three times a day (TID) | ORAL | 1 refills | Status: DC | PRN

## 2021-03-04 MED ORDER — DEXAMETHASONE 2 MG PO TABS: 10.0000 mg | ORAL_TABLET | ORAL | 0 refills | Status: DC

## 2021-03-05 ENCOUNTER — Other Ambulatory Visit: Payer: Medicare HMO

## 2021-03-05 ENCOUNTER — Other Ambulatory Visit: Payer: Self-pay

## 2021-03-05 ENCOUNTER — Ambulatory Visit: Payer: Medicare HMO | Admitting: Rehabilitative and Restorative Service Providers"

## 2021-03-05 DIAGNOSIS — R600 Localized edema: Secondary | ICD-10-CM

## 2021-03-05 DIAGNOSIS — M25511 Pain in right shoulder: Secondary | ICD-10-CM

## 2021-03-05 DIAGNOSIS — M6281 Muscle weakness (generalized): Secondary | ICD-10-CM | POA: Diagnosis not present

## 2021-03-05 DIAGNOSIS — R293 Abnormal posture: Secondary | ICD-10-CM | POA: Diagnosis not present

## 2021-03-05 DIAGNOSIS — G8929 Other chronic pain: Secondary | ICD-10-CM

## 2021-03-05 NOTE — Therapy (Signed)
[facility] [address] [place], [place], [address] Phone: [phone]   Fax:  [phone]  Physical Therapy Evaluation  Patient Details  Name: CAMRYNN MCCLINTIC MRN: [id number] Date of Birth: 1952-06-72 Referring Provider (PT): [name] Persons PA-C   Encounter Date: 03/05/2021   PT End of Session - 03/05/21 1058     Visit Number 1    Number of Visits 12    Date for PT Re-Evaluation 05/14/21   extended for HUMANA timeframe approval   Authorization Type HUMANA 12 visits until 05/14/2021 requested    Authorization Time Period 05/14/2021    Authorization - Visit Number 1    Authorization - Number of Visits 12    Progress Note Due on Visit 10    Activity Tolerance Patient tolerated treatment well    Behavior During Therapy [facility] for tasks assessed/performed             Past Medical History:  Diagnosis Date   Arthritis    Bowen's disease    excised 1992   Chronic low back pain    Chronic radiation cystitis    DDD (degenerative disc disease), cervical    Family history of breast cancer    Family history of lung cancer    Family history of non-Hodgkin's lymphoma    Family history of ovarian cancer    Headache    Hx of varicella    Hydronephrosis of right kidney    urologist--- dr Tresa Moore,  malignant,  treated with ureter stent   Hypothyroidism    followed by pcp   Lower urinary tract symptoms (LUTS)    01-12-2021  per pt treated with accupuncture at dr Tresa Moore office   Lymphedema of both lower extremities    PICC (peripherally inserted central catheter) in place 01/11/2021   placed at [facility] in [place], [place] for IV antibiotics   Positive blood culture 01/08/2021   Klebsiella Pneumaniae,  treated with daily IV antibiotic   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[facility]) oncologist--- dr Benay Spice   dx 02/ 2018,  invasive SCC , completed chemo/ radiation 12-23-2016;  recurrent metastatsis retroperitoneal lymphdenopathy,  completed radiation  04-13-2020 residual right ureteral uropathy obstruction   Retroperitoneal lymphadenopathy    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   Sepsis due to Klebsiella pneumoniae ([place]) 01/07/2021   pt admitted to [facility] in [place],  dx sepsis secondary to acute pyelonephritis with bacteremia , positive blood culture,  discharged 01-11-2021 home daily IV antibiotic   Wears glasses     Past Surgical History:  Procedure Laterality Date   BUNIONECTOMY Right yrs ago   [place] Right 06/12/2020   Procedure: CYSTOSCOPY WITH RETROGRADE PYELOGRAM/URETERAL STENT EXCHANGE;  Surgeon: Alexis Frock, MD;  Location: [facility];  Service: Urology;  Laterality: Right;   CYSTOSCOPY W/ URETERAL STENT PLACEMENT Right 01/13/2021   Procedure: CYSTOSCOPY WITH RETROGRADE PYELOGRAM/URETERAL STENT REMOVALRIGHT;  Surgeon: Alexis Frock, MD;  Location: [facility];  Service: Urology;  Laterality: Right;  [place], URETEROSCOPY AND STENT PLACEMENT Right 01/28/2020   Procedure: CYSTOSCOPY WITH RETROGRADE PYELOGRAM, URETEROSCOPY AND STENT PLACEMENT;  Surgeon: Alexis Frock, MD;  Location[facility];  Service: Urology;  Laterality: Right;   IR GENERIC HISTORICAL  11/14/2016   IR US GUIDE VASC ACCESS RIGHT 11/14/2016 [facility]   IR GENERIC HISTORICAL  11/14/2016   IR FLUORO GUIDE CV LINE RIGHT 11/14/2016 [facility]  IR GENERIC HISTORICAL  12/12/2016   IR US GUIDE VASC ACCESS RIGHT 12/12/2016 [name], MD [facility]   IR GENERIC HISTORICAL  12/12/2016   IR FLUORO GUIDE CV LINE RIGHT 12/12/2016 [name], MD [facility]   SKIN CANCER EXCISION  1990   bowens disease    There were no vitals filed for this visit.    Subjective Assessment - 03/05/21 1108     Subjective Pt. comes to clinic s/p fall about 2.5 -3 months ago onto concrete resulting Rt shoulder/arm pain that  has continued to bother her, resulting in MD visits and ultimately referral for physical therapy.  Pt. indicated trouble c arm use during day and variable pain complaints that disrupt sleeping.    Pertinent History Recent dx of metastatic rectal cancer with chemotherapy ongoing  DDD cervical, history of chronic back pain,    Limitations Lifting;House hold activities    Patient Stated Goals Reduce pain, improved Rt arm movements.    Currently in Pain? Yes    Pain Score 6    pain at worst 10/10 in last few weeks   Pain Location Shoulder    Pain Orientation Right    Pain Descriptors / Indicators Aching;Sore;Sharp    Pain Type Chronic pain    Pain Radiating Towards Rt shoulder to upper arm    Pain Onset More than a month ago    Pain Frequency Intermittent    Aggravating Factors  lifting arm, reaching, "motion"    Pain Relieving Factors Tylenol, heating pad, gel    Effect of Pain on Daily Activities Limited in Rt arm movements/lifting                [facility] PT Assessment - 03/05/21 0001       Assessment   Medical Diagnosis Rt shoulder/arm pain    Referring Provider (PT) [name] Persons PA-C    Onset Date/Surgical Date 12/18/20    Hand Dominance Left      Precautions   Precaution Comments Active cancer      Balance Screen   Has the patient fallen in the past 6 months Yes    How many times? 1    Has the patient had a decrease in activity level because of a fear of falling?  No    Is the patient reluctant to leave their home because of a fear of falling?  No      Home Social worker Private residence    Living Arrangements Alone    Additional Comments has sisters that show up to help.  4 stairs to enter house, 8-9 to enter garage      Prior [place] with shopping, lifting, carrying (limited lifting at this time)    Leisure Has 5 acres, gardening, Nurse, learning disability care      Cognition   Overall Cognitive Status Within  Functional Limits for tasks assessed      Observation/Other Assessments   Observations Localized edema Rt shoulder laterally    Focus on Therapeutic Outcomes (FOTO)  Intake 48%, prediction 64%      Sensation   Light Touch Appears Intact      Posture/Postural Control   Posture/Postural Control Postural limitations    Postural Limitations Rounded Shoulders      ROM / Strength   AROM / PROM / Strength Strength;PROM;AROM      AROM   Overall AROM Comments Seated active flexion to 100 degress c end  range pain, mild shrug.  Rt hand behind back to Rt PSIS with pain, Rt hand behind head to occiput.  Lt shoulder WFL at this time    AROM Assessment Site Shoulder    Right/Left Shoulder Left;Right    Right Shoulder Flexion 140 Degrees   in supine c pain   Right Shoulder ABduction 100 Degrees   in supine c pain   Right Shoulder Internal Rotation 35 Degrees   in supine 30 deg c pain   Right Shoulder External Rotation 30 Degrees   in supine 30 deg abduction c pain     PROM   PROM Assessment Site Shoulder    Right/Left Shoulder Left;Right    Right Shoulder Flexion 142 Degrees   in supine c pain   Right Shoulder ABduction 120 Degrees   in supine c pain   Right Shoulder Internal Rotation 35 Degrees   in supine 30 deg abduction c pain   Right Shoulder External Rotation 30 Degrees   in supine 30 deg abduction c pain     Strength   Strength Assessment Site Elbow;Shoulder    Right/Left Shoulder Left;Right    Right Shoulder Flexion 3+/5   pain   Right Shoulder ABduction 2/5   pain   Right Shoulder Internal Rotation 3+/5   pain   Right Shoulder External Rotation 3+/5   pain   Left Shoulder Flexion 5/5    Left Shoulder ABduction 5/5    Left Shoulder Internal Rotation 5/5    Left Shoulder External Rotation 5/5    Right/Left Elbow Left;Right    Right Elbow Flexion 5/5    Right Elbow Extension 5/5    Left Elbow Flexion 5/5    Left Elbow Extension 5/5      Palpation   Palpation comment  Tenderness to Rt upper trap, infraspinatus, supraspinatus, middle and anterior deltoid.  No joint hypomobility noted in early and mid range, muscle guarding in PROM assessment.      Special Tests   Other special tests (-) Lift off on Rt, painful arc on Rt in mid range (also end range pain)                        Objective measurements completed on examination: See above findings.       [facility] Adult PT Treatment/Exercise - 03/05/21 0001       Exercises   Exercises Other Exercises;Shoulder    Other Exercises  HEP instruction/performance c cues for techniques, handout provided.  Trial set performed of each for comprehension and symptom assessment.  HEP consisting of supine wand flexion/Lt arm support, scapular retraction 5 sec hold, seated Lt arm support pendulums (sling positioning).      Manual Therapy   Manual therapy comments g2 inferior joint mobilizations, prom to tolerance                    PT Education - 03/05/21 1058     Education Details HEP, POC    Person(s) Educated Patient    Methods Explanation;Demonstration;Verbal cues;Handout    Comprehension Returned demonstration;Verbalized understanding              PT Short Term Goals - 03/05/21 1100       PT SHORT TERM GOAL #1   Title Patient will demonstrate independent use of home exercise program to maintain progress from in clinic treatments.    Time 3    Period Weeks    Status New  Target Date 03/26/21               PT Long Term Goals - 03/05/21 1100       PT LONG TERM GOAL #1   Title Patient will demonstrate/report pain at worst less than or equal to 2/10 to facilitate minimal limitation in daily activity secondary to pain symptoms.    Time 10    Period Weeks    Status New    Target Date 05/14/21      PT LONG TERM GOAL #2   Title Patient will demonstrate independent use of home exercise program to facilitate ability to maintain/progress functional gains from skilled  physical therapy services.    Time 10    Period Weeks    Status New    Target Date 05/14/21      PT LONG TERM GOAL #3   Title Patient will demonstrate Rt [place] joint mobility WFL to facilitate usual self care, dressing, reaching overhead at PLOF s limitation due to symptoms.    Time 10    Period Weeks    Status New    Target Date 05/14/21      PT LONG TERM GOAL #4   Title Patient will demonstrate Rt UE MMT 5/5 throughout to facilitate usual lifting, carrying in functional activity to PLOF s limitation.    Time 10    Period Weeks    Status New    Target Date 05/14/21      PT LONG TERM GOAL #5   Title Pt. will demonstrate FOTO outcome > or = 64% to indicate reduced disability due to condition.    Time 10    Period Weeks    Status New    Target Date 05/14/21                    Plan - 03/05/21 1059     Clinical Impression Statement Patient is a 70 y.o. who comes to clinic with complaints of Rt shoulder/arm pain s/p fall approx. 2.5 months ago  with mobility, strength and movement coordination deficits that impair their ability to perform usual daily and recreational functional activities without increase difficulty/symptoms at this time.  Patient to benefit from skilled PT services to address impairments and limitations to improve to previous level of function without restriction secondary to condition.    Personal Factors and Comorbidities Comorbidity 3+    Comorbidities Recent dx of metastatic rectal cancer with chemotherapy ongoing  DDD cervical, history of chronic back pain,    Examination-Activity Limitations Bathing;Sleep;Bed Mobility;Carry;Dressing;Hygiene/Grooming;Lift;Reach Overhead    Examination-Participation Restrictions Cleaning;Community Activity;Shop;Occupation;Meal Prep;Laundry    Stability/Clinical Decision Making Stable/Uncomplicated    Clinical Decision Making Low    Rehab Potential Good    PT Frequency 2x / week    PT Duration Other (comment)   10 weeks  (extended for HUMANA approval time)   PT Treatment/Interventions ADLs/Self Care Home Management;Cryotherapy;Moist Heat;Traction;Balance training;Therapeutic exercise;Therapeutic activities;Functional mobility training;Stair training;Gait training;DME Instruction;Ultrasound;Neuromuscular re-education;Patient/family education;Passive range of motion;Spinal Manipulations;Joint Manipulations;Dry needling;Taping;Vasopneumatic Device;Manual techniques    PT Next Visit Plan Progress passive/AAROM with goals of introducting active movement in gravity reduced positioning, possible dry needling? (havent discussed yet)    PT Home Exercise Plan [id number]    Consulted and Agree with Plan of Care Patient             Patient will benefit from skilled therapeutic intervention in order to improve the following deficits and impairments:  Decreased endurance, Hypomobility, Pain, Impaired UE functional use,  Increased fascial restricitons, Decreased strength, Decreased activity tolerance, Increased edema, Decreased mobility, Decreased range of motion, Impaired flexibility, Improper body mechanics, Impaired perceived functional ability, Postural dysfunction  Visit Diagnosis: Chronic right shoulder pain  Muscle weakness (generalized)  Abnormal posture  Localized edema     Problem List Patient Active Problem List   Diagnosis Date Noted   Goals of care, counseling/discussion 03/01/2021   SIRS (systemic inflammatory response syndrome) ([place]) 01/13/2021   Pyelonephritis 01/13/2021   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   Genetic testing 01/27/2020   Family history of breast cancer    Family history of lung cancer    Family history of ovarian cancer    Family history of non-Hodgkin's lymphoma    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   Port catheter in place 11/21/2016   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 M79.601 Treatment diagnosis? (if different than referring diagnosis) M25.511 What was this (referring dx) caused by? []  Surgery [x]  Fall [x]  Ongoing issue []  Arthritis []  Other: ____________  Laterality: [x]  Rt []  Lt []  Both  Check all possible CPT codes:      [x]  97110 (Therapeutic Exercise)  []  92507 (SLP Treatment)  [x]  97112 (Neuro Re-ed)   []  92526 (Swallowing Treatment)   [x]  97116 (Gait Training)   []  [id number] (Cognitive Training, 1st 15 minutes) [x]  97140 (Manual Therapy)   []  97130 (Cognitive Training, each add'l 15 minutes)  [x]  97530 (Therapeutic Activities)  []  Other, List CPT Code ____________    [x]  78938 (Self Care)       []  All codes above (97110 - 97535)  [x]  97012 (Mechanical Traction)  [x]  97014 (E-stim Unattended)  []  97032 (E-stim manual)  []  97033 (Ionto)  []  97035 (Ultrasound)  []  97760 (Orthotic Fit) [x]  [id number] (Physical Performance Training) []  [id number] (Aquatic Therapy) []  10175 (Contrast Bath) []  [id number] (Paraffin) []  97597 (Wound Care 1st 20 sq cm) []  97598 (Wound Care each add'l 20 sq cm) [x]  97016 (Vasopneumatic Device) []  [phone] Comptroller) []  [phone] (Prosthetic Training)   Scot Jun, PT, DPT, OCS, ATC 03/05/21  11:54 AM    [facility] Physical [facility] [address] [place], [place], [address] Phone: [phone]   Fax:  [phone]  Name: MARIJO QUIZON MRN: [id number] Date of Birth: [birth year]

## 2021-03-05 NOTE — Progress Notes (Signed)
The following biosimilar Fulphila ([id number]) has been selected for use in this patient.  Kennith Center, Pharm.D., CPP 03/05/2021@8 :45 AM

## 2021-03-05 NOTE — Patient Instructions (Signed)
Access Code: [email or web address] URL: [email or web address] Date: 03/05/2021 Prepared by: Scot Jun  Exercises Supine Shoulder Flexion Extension AAROM with Dowel - 2-3 x daily - 7 x weekly - 3 sets - 10 reps - 5 hold Supine Shoulder Flexion PROM (Mirrored) - 2-3 x daily - 7 x weekly - 1-2 sets - 10 reps - 5 hold Seated Scapular Retraction - 2 x daily - 7 x weekly - 2 sets - 10 reps - 5 hold Standing Circular Shoulder Pendulum Supported with Arm Bent - 2 x daily - 7 x weekly - 2-3 sets - 10 reps

## 2021-03-06 ENCOUNTER — Other Ambulatory Visit: Payer: Self-pay | Admitting: Oncology

## 2021-03-09 ENCOUNTER — Telehealth: Payer: Self-pay | Admitting: Nurse Practitioner

## 2021-03-09 LAB — CEA (IN HOUSE-CHCC): CEA (CHCC-In House): 1.15 ng/mL (ref 0.00–5.00)

## 2021-03-09 NOTE — Progress Notes (Signed)
Pharmacist Chemotherapy Monitoring - Initial Assessment    Anticipated start date: 03/11/21   The following has been reviewed per standard work regarding the patient's treatment regimen: The patient's diagnosis, treatment plan and drug doses, and organ/hematologic function Lab orders and baseline tests specific to treatment regimen  The treatment plan start date, drug sequencing, and pre-medications Prior authorization status  Patient's documented medication list, including drug-drug interaction screen and prescriptions for anti-emetics and supportive care specific to the treatment regimen The drug concentrations, fluid compatibility, administration routes, and timing of the medications to be used The patient's access for treatment and lifetime cumulative dose history, if applicable  The patient's medication allergies and previous infusion related reactions, if applicable   Changes made to treatment plan:  N/A  Follow up needed:  N/A   Ashley Moore, [place], 03/09/2021  8:08 AM

## 2021-03-09 NOTE — Telephone Encounter (Signed)
Scheduled appointment per 06/17 sch msg. Patient is aware. 

## 2021-03-11 ENCOUNTER — Encounter: Payer: Self-pay | Admitting: Nurse Practitioner

## 2021-03-11 ENCOUNTER — Ambulatory Visit: Payer: Self-pay

## 2021-03-11 ENCOUNTER — Other Ambulatory Visit: Payer: Self-pay

## 2021-03-11 ENCOUNTER — Ambulatory Visit: Payer: Self-pay | Admitting: Nurse Practitioner

## 2021-03-11 ENCOUNTER — Other Ambulatory Visit: Payer: Medicare HMO

## 2021-03-11 ENCOUNTER — Inpatient Hospital Stay (HOSPITAL_BASED_OUTPATIENT_CLINIC_OR_DEPARTMENT_OTHER): Payer: Medicare HMO | Admitting: Nurse Practitioner

## 2021-03-11 ENCOUNTER — Inpatient Hospital Stay: Payer: Medicare HMO

## 2021-03-11 VITALS — BP 161/68 | HR 111 | Temp 97.8°F | Resp 18 | Ht 62.0 in | Wt 172.0 lb

## 2021-03-11 VITALS — BP 144/80 | HR 92 | Resp 18

## 2021-03-11 DIAGNOSIS — N1330 Unspecified hydronephrosis: Secondary | ICD-10-CM | POA: Diagnosis not present

## 2021-03-11 DIAGNOSIS — C210 Malignant neoplasm of anus, unspecified: Secondary | ICD-10-CM | POA: Diagnosis not present

## 2021-03-11 DIAGNOSIS — Z5111 Encounter for antineoplastic chemotherapy: Secondary | ICD-10-CM | POA: Diagnosis not present

## 2021-03-11 DIAGNOSIS — C778 Secondary and unspecified malignant neoplasm of lymph nodes of multiple regions: Secondary | ICD-10-CM | POA: Diagnosis not present

## 2021-03-11 DIAGNOSIS — Z923 Personal history of irradiation: Secondary | ICD-10-CM | POA: Diagnosis not present

## 2021-03-11 MED ORDER — SODIUM CHLORIDE 0.9 % IV SOLN
150.0000 mg | Freq: Once | INTRAVENOUS | Status: AC
  Administered 2021-03-11: 150 mg via INTRAVENOUS
  Filled 2021-03-11: qty 5

## 2021-03-11 MED ORDER — SODIUM CHLORIDE 0.9 % IV SOLN
150.0000 mg/m2 | Freq: Once | INTRAVENOUS | Status: AC
  Administered 2021-03-11: 276 mg via INTRAVENOUS
  Filled 2021-03-11: qty 46

## 2021-03-11 MED ORDER — FAMOTIDINE 20 MG IN NS 100 ML IVPB
20.0000 mg | Freq: Once | INTRAVENOUS | Status: AC
  Administered 2021-03-11: 20 mg via INTRAVENOUS
  Filled 2021-03-11: qty 100

## 2021-03-11 MED ORDER — PALONOSETRON HCL INJECTION 0.25 MG/5ML
0.2500 mg | Freq: Once | INTRAVENOUS | Status: AC
  Administered 2021-03-11: 0.25 mg via INTRAVENOUS
  Filled 2021-03-11: qty 5

## 2021-03-11 MED ORDER — SODIUM CHLORIDE 0.9 % IV SOLN
353.2000 mg | Freq: Once | INTRAVENOUS | Status: AC
  Administered 2021-03-11: 350 mg via INTRAVENOUS
  Filled 2021-03-11: qty 35

## 2021-03-11 MED ORDER — SODIUM CHLORIDE 0.9 % IV SOLN
Freq: Once | INTRAVENOUS | Status: AC
  Filled 2021-03-11: qty 250

## 2021-03-11 MED ORDER — SODIUM CHLORIDE 0.9 % IV SOLN
10.0000 mg | Freq: Once | INTRAVENOUS | Status: AC
Start: 2021-03-11 — End: 2021-03-11
  Administered 2021-03-11: 10 mg via INTRAVENOUS
  Filled 2021-03-11: qty 1

## 2021-03-11 NOTE — Progress Notes (Signed)
During infusion after eating half a sandwich pt c/o stomach cramping.  No other issues noted. Continued to monitor. At the end of taxol infusion pt c/o stomach cramping worse and that it comes and goes.  [place] RN made are, Gentle IVF hung.  Pt then felt nauseated. IVF increased. VSS, call made to [facility].  Lattie Haw here to see pt;Marland Kitchen  Pian now gone.  Ok to infuse Carboplatin

## 2021-03-11 NOTE — Patient Instructions (Signed)
Ashley Moore  Discharge Instructions: Thank you for choosing [facility] to provide your oncology and hematology care.   If you have a lab appointment with the [facility], please go directly to the [facility] and check in at the registration area.   Wear comfortable clothing and clothing appropriate for easy access to any Portacath or PICC line.   We strive to give you quality time with your provider. You may need to reschedule your appointment if you arrive late (15 or more minutes).  Arriving late affects you and other patients whose appointments are after yours.  Also, if you miss three or more appointments without notifying the office, you may be dismissed from the clinic at the provider's discretion.      For prescription refill requests, have your pharmacy contact our office and allow 72 hours for refills to be completed.    Today you received the following chemotherapy and/or immunotherapy agents TAXOL and CARBOPLATIN      To help prevent nausea and vomiting after your treatment, we encourage you to take your nausea medication as directed.  BELOW ARE SYMPTOMS THAT SHOULD BE REPORTED IMMEDIATELY: *FEVER GREATER THAN 100.4 F (38 C) OR HIGHER *CHILLS OR SWEATING *NAUSEA AND VOMITING THAT IS NOT CONTROLLED WITH YOUR NAUSEA MEDICATION *UNUSUAL SHORTNESS OF BREATH *UNUSUAL BRUISING OR BLEEDING *URINARY PROBLEMS (pain or burning when urinating, or frequent urination) *BOWEL PROBLEMS (unusual diarrhea, constipation, pain near the anus) TENDERNESS IN MOUTH AND THROAT WITH OR WITHOUT PRESENCE OF ULCERS (sore throat, sores in mouth, or a toothache) UNUSUAL RASH, SWELLING OR PAIN  UNUSUAL VAGINAL DISCHARGE OR ITCHING   Items with * indicate a potential emergency and should be followed up as soon as possible or go to the Emergency Department if any problems should occur.  Please show the CHEMOTHERAPY ALERT CARD or IMMUNOTHERAPY ALERT CARD at  check-in to the Emergency Department and triage nurse.  Should you have questions after your visit or need to cancel or reschedule your appointment, please contact [facility]  Dept: [phone]  and follow the prompts.  Office hours are 8:00 a.m. to 4:30 p.m. Monday - Friday. Please note that voicemails left after 4:00 p.m. may not be returned until the following business day.  We are closed weekends and major holidays. You have access to a nurse at all times for urgent questions. Please call the main number to the clinic Dept: [phone] and follow the prompts.   For any non-urgent questions, you may also contact your provider using MyChart. We now offer e-Visits for anyone 1 and older to request care online for non-urgent symptoms. For details visit [email or web address].   Also download the MyChart app! Go to the app store, search "MyChart", open the app, select [facility], and log in with your MyChart username and password.  Due to Covid, a mask is required upon entering the hospital/clinic. If you do not have a mask, one will be given to you upon arrival. For doctor visits, patients may have 1 support person aged 3 or older with them. For treatment visits, patients cannot have anyone with them due to current Covid guidelines and our immunocompromised population.   Paclitaxel injection What is this medication? PACLITAXEL (PAK li TAX el) is a chemotherapy drug. It targets fast dividing cells, like cancer cells, and causes these cells to die. This medicine is used to treat ovarian cancer, breast cancer, lung cancer, Kaposi's sarcoma, andother cancers. This medicine  may be used for other purposes; ask your health care provider orpharmacist if you have questions. COMMON BRAND NAME(S): Onxol, Taxol What should I tell my care team before I take this medication? They need to know if you have any of these conditions: history of irregular heartbeat liver disease low  blood counts, like low white cell, platelet, or red cell counts lung or breathing disease, like asthma tingling of the fingers or toes, or other nerve disorder an unusual or allergic reaction to paclitaxel, alcohol, polyoxyethylated castor oil, other chemotherapy, other medicines, foods, dyes, or preservatives pregnant or trying to get pregnant breast-feeding How should I use this medication? This drug is given as an infusion into a vein. It is administered in a hospitalor clinic by a specially trained health care professional. Talk to your pediatrician regarding the use of this medicine in children.Special care may be needed. Overdosage: If you think you have taken too much of this medicine contact apoison control center or emergency room at once. NOTE: This medicine is only for you. Do not share this medicine with others. What if I miss a dose? It is important not to miss your dose. Call your doctor or health careprofessional if you are unable to keep an appointment. What may interact with this medication? Do not take this medicine with any of the following medications: live virus vaccines This medicine may also interact with the following medications: antiviral medicines for hepatitis, HIV or AIDS certain antibiotics like erythromycin and clarithromycin certain medicines for fungal infections like ketoconazole and itraconazole certain medicines for seizures like carbamazepine, phenobarbital, phenytoin gemfibrozil nefazodone rifampin St. John's wort This list may not describe all possible interactions. Give your health care provider a list of all the medicines, herbs, non-prescription drugs, or dietary supplements you use. Also tell them if you smoke, drink alcohol, or use illegaldrugs. Some items may interact with your medicine. What should I watch for while using this medication? Your condition will be monitored carefully while you are receiving this medicine. You will need important  blood work done while you are taking thismedicine. This medicine can cause serious allergic reactions. To reduce your risk you will need to take other medicine(s) before treatment with this medicine. If you experience allergic reactions like skin rash, itching or hives, swelling of theface, lips, or tongue, tell your doctor or health care professional right away. In some cases, you may be given additional medicines to help with side effects.Follow all directions for their use. This drug may make you feel generally unwell. This is not uncommon, as chemotherapy can affect healthy cells as well as cancer cells. Report any side effects. Continue your course of treatment even though you feel ill unless yourdoctor tells you to stop. Call your doctor or health care professional for advice if you get a fever, chills or sore throat, or other symptoms of a cold or flu. Do not treat yourself. This drug decreases your body's ability to fight infections. Try toavoid being around people who are sick. This medicine may increase your risk to bruise or bleed. Call your doctor orhealth care professional if you notice any unusual bleeding. Be careful brushing and flossing your teeth or using a toothpick because you may get an infection or bleed more easily. If you have any dental work done,tell your dentist you are receiving this medicine. Avoid taking products that contain aspirin, acetaminophen, ibuprofen, naproxen, or ketoprofen unless instructed by your doctor. These medicines may hide afever. Do not become pregnant while taking  this medicine. Women should inform their doctor if they wish to become pregnant or think they might be pregnant. There is a potential for serious side effects to an unborn child. Talk to your health care professional or pharmacist for more information. Do not breast-feed aninfant while taking this medicine. Men are advised not to father a child while receiving this medicine. This product may  contain alcohol. Ask your pharmacist or healthcare provider if this medicine contains alcohol. Be sure to tell all healthcare providers you are taking this medicine. Certain medicines, like metronidazole and disulfiram, can cause an unpleasant reaction when taken with alcohol. The reaction includes flushing, headache, nausea, vomiting, sweating, and increased thirst. Thereaction can last from 30 minutes to several hours. What side effects may I notice from receiving this medication? Side effects that you should report to your doctor or health care professionalas soon as possible: allergic reactions like skin rash, itching or hives, swelling of the face, lips, or tongue breathing problems changes in vision fast, irregular heartbeat high or low blood pressure mouth sores pain, tingling, numbness in the hands or feet signs of decreased platelets or bleeding - bruising, pinpoint red spots on the skin, black, tarry stools, blood in the urine signs of decreased red blood cells - unusually weak or tired, feeling faint or lightheaded, falls signs of infection - fever or chills, cough, sore throat, pain or difficulty passing urine signs and symptoms of liver injury like dark yellow or brown urine; general ill feeling or flu-like symptoms; light-colored stools; loss of appetite; nausea; right upper belly pain; unusually weak or tired; yellowing of the eyes or skin swelling of the ankles, feet, hands unusually slow heartbeat Side effects that usually do not require medical attention (report to yourdoctor or health care professional if they continue or are bothersome): diarrhea hair loss loss of appetite muscle or joint pain nausea, vomiting pain, redness, or irritation at site where injected tiredness This list may not describe all possible side effects. Call your doctor for medical advice about side effects. You may report side effects to FDA at1-800-FDA-1088. Where should I keep my medication? This  drug is given in a hospital or clinic and will not be stored at home. NOTE: This sheet is a summary. It may not cover all possible information. If you have questions about this medicine, talk to your doctor, pharmacist, orhealth care provider.  2022 Elsevier/Gold Standard (2019-08-07 13:37:23)   Carboplatin injection What is this medication? CARBOPLATIN (KAR boe pla tin) is a chemotherapy drug. It targets fast dividing cells, like cancer cells, and causes these cells to die. This medicine is usedto treat ovarian cancer and many other cancers. This medicine may be used for other purposes; ask your health care provider orpharmacist if you have questions. COMMON BRAND NAME(S): Paraplatin What should I tell my care team before I take this medication? They need to know if you have any of these conditions: blood disorders hearing problems kidney disease recent or ongoing radiation therapy an unusual or allergic reaction to carboplatin, cisplatin, other chemotherapy, other medicines, foods, dyes, or preservatives pregnant or trying to get pregnant breast-feeding How should I use this medication? This drug is usually given as an infusion into a vein. It is administered in [facility] or clinic by a specially trained health care professional. Talk to your pediatrician regarding the use of this medicine in children.Special care may be needed. Overdosage: If you think you have taken too much of this medicine contact apoison control center or  emergency room at once. NOTE: This medicine is only for you. Do not share this medicine with others. What if I miss a dose? It is important not to miss a dose. Call your doctor or health careprofessional if you are unable to keep an appointment. What may interact with this medication? medicines for seizures medicines to increase blood counts like filgrastim, pegfilgrastim, sargramostim some antibiotics like amikacin, gentamicin, neomycin, streptomycin,  tobramycin vaccines Talk to your doctor or health care professional before taking any of thesemedicines: acetaminophen aspirin ibuprofen ketoprofen naproxen This list may not describe all possible interactions. Give your health care provider a list of all the medicines, herbs, non-prescription drugs, or dietary supplements you use. Also tell them if you smoke, drink alcohol, or use illegaldrugs. Some items may interact with your medicine. What should I watch for while using this medication? Your condition will be monitored carefully while you are receiving this medicine. You will need important blood work done while you are taking thismedicine. This drug may make you feel generally unwell. This is not uncommon, as chemotherapy can affect healthy cells as well as cancer cells. Report any side effects. Continue your course of treatment even though you feel ill unless yourdoctor tells you to stop. In some cases, you may be given additional medicines to help with side effects.Follow all directions for their use. Call your doctor or health care professional for advice if you get a fever, chills or sore throat, or other symptoms of a cold or flu. Do not treat yourself. This drug decreases your body's ability to fight infections. Try toavoid being around people who are sick. This medicine may increase your risk to bruise or bleed. Call your doctor orhealth care professional if you notice any unusual bleeding. Be careful brushing and flossing your teeth or using a toothpick because you may get an infection or bleed more easily. If you have any dental work done,tell your dentist you are receiving this medicine. Avoid taking products that contain aspirin, acetaminophen, ibuprofen, naproxen, or ketoprofen unless instructed by your doctor. These medicines may hide afever. Do not become pregnant while taking this medicine. Women should inform their doctor if they wish to become pregnant or think they might be  pregnant. There is a potential for serious side effects to an unborn child. Talk to your health care professional or pharmacist for more information. Do not breast-feed aninfant while taking this medicine. What side effects may I notice from receiving this medication? Side effects that you should report to your doctor or health care professionalas soon as possible: allergic reactions like skin rash, itching or hives, swelling of the face, lips, or tongue signs of infection - fever or chills, cough, sore throat, pain or difficulty passing urine signs of decreased platelets or bleeding - bruising, pinpoint red spots on the skin, black, tarry stools, nosebleeds signs of decreased red blood cells - unusually weak or tired, fainting spells, lightheadedness breathing problems changes in hearing changes in vision chest pain high blood pressure low blood counts - This drug may decrease the number of white blood cells, red blood cells and platelets. You may be at increased risk for infections and bleeding. nausea and vomiting pain, swelling, redness or irritation at the injection site pain, tingling, numbness in the hands or feet problems with balance, talking, walking trouble passing urine or change in the amount of urine Side effects that usually do not require medical attention (report to yourdoctor or health care professional if they continue or are  bothersome): hair loss loss of appetite metallic taste in the mouth or changes in taste This list may not describe all possible side effects. Call your doctor for medical advice about side effects. You may report side effects to FDA at1-800-FDA-1088. Where should I keep my medication? This drug is given in a hospital or clinic and will not be stored at home. NOTE: This sheet is a summary. It may not cover all possible information. If you have questions about this medicine, talk to your doctor, pharmacist, orhealth care provider.  2022 Elsevier/Gold  Standard (2007-12-11 14:38:05)

## 2021-03-11 NOTE — Progress Notes (Signed)
[facility] OFFICE PROGRESS NOTE   Diagnosis: Anal cancer  INTERVAL HISTORY:   Ashley Moore returns as scheduled.  She denies pain.  No nausea or vomiting.  Bowels are moving.  She has a good appetite.  She reports intermittent leg edema.  Swelling tends to improve overnight.  No fever or shortness of breath.  Occasional cough.  Objective:  Vital signs in last 24 hours:  Blood pressure (!) 161/68, pulse (!) 111, temperature 97.8 F (36.6 C), temperature source Oral, resp. rate 18, height 5\' 2"  (1.575 m), weight 172 lb (78 kg), SpO2 100 %.    HEENT: No thrush or ulcers. Resp: Inspiratory rales both lung bases.  No respiratory distress. Cardio: Regular rate and rhythm. GI: Abdomen soft.  Tender at the low mid abdomen.  No hepatomegaly.  No mass. Vascular: Pitting edema lower leg bilaterally.    Lab Results:  Lab Results  Component Value Date   WBC 4.1 03/04/2021   HGB 10.1 (L) 03/04/2021   HCT 32.1 (L) 03/04/2021   MCV 86.5 03/04/2021   PLT 275 03/04/2021   NEUTROABS 2.8 03/04/2021    Imaging:  No results found.  Medications: I have reviewed the patient's current medications.  Assessment/Plan:  Anal cancer CT abdomen/pelvis 10/21/2016-thickening of the anus extending to the junction between the anus and rectum with a large stool ball in the rectum and mild fat stranding posterior to the rectum. Enlarged lymph nodes in the right and left inguinal regions. A few mildly prominent nodes seen posterior to the rectum. 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 PET scan [id number] hypermetabolic anal mass with hypermetabolic metastases to the groin region bilaterally, left pelvic sidewall and presacral space. Initiation of radiation and cycle 1 5-FU/mitomycin C 11/14/2016 Cycle 2 5-FU/mitomycin C 12/12/2016 (5-FU dose reduced due to mucositis, diarrhea, skin breakdown) Radiation completed 12/23/2016 CT abdomen/pelvis  04/14/2017-resolution of anal mass and bilateral inguinal lymphadenopathy. No residual tumor seen. CT abdomen/pelvis 01/28/2020-right hydroureteronephrosis, transition at the level of the mid right ureter, retroperitoneal lymphadenopathy CT renal stone study 01/28/2020-severe right hydronephrosis and proximal right hydroureter, 8 mm area of increased attenuation in the proximal right ureter-stone versus soft tissue mass PET 02/13/2020-hypermetabolic right retroperitoneal nodal metastases in the aortocaval and posterior pericaval chains, nonspecific mild anal wall hypermetabolism, right nephroureteral stent 02/21/2020 anorectal exam per Dr. Ashley Jacobs radiation changes of the skin around the anal margin.  Posterior midline smooth scarring.  Mildly stenotic.  Stenosis seems better.  No palpable concerning lesions.  Entire anal canal and distal rectum feels smooth and healthy.  Partial anoscopy performed with no lesions in the anal canal. Xeloda/radiation beginning 03/02/2020, completed 04/13/2020 CT abdomen/pelvis 06/17/2020-resolution of right retroperitoneal lymphadenopathy, no remaining pathologically enlarged lymph nodes, residual mild right hydronephrosis, no evidence of progressive disease, stable anal wall thickening Digital rectal exam by Dr. Quentin Cornwall 08/26/2020-posterior midline smooth scarring.  Mildly stenotic.  Stenosis seems better.  No palpable concerning lesions.  Anal canal feels smooth without palpable concern.  Unable to tolerate anoscopy.  Next visit in 6 months for surveillance. CT abdomen/pelvis 10/28/2020-no evidence of local recurrence or metastatic disease.  Stable mild rectal wall thickening.  New diffuse bladder wall thickening possibly related to prior radiation CT abdomen/pelvis 01/16/2021- recurrent right-sided hydronephrosis and proximal right hydroureter status post removal of right ureter stent, new soft tissue nodule along the course of the right ureter between the right psoas and  IVC suspicious for recurrent lymphadenopathy PET 02/12/2021-small focus of hypermetabolism at the anorectal junction  without a definable mass, right retroperitoneal nodal metastasis with obstruction of the proximal right ureter, right middle lobe hypermetabolic nodule, hypermetabolism in the right hilum without a defined nodal mass, hypermetabolic left supraclavicular and left axillary nodes 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 Question direct extension from the anal cancer versus metastatic disease from anal cancer versus a separate malignant process. 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 Multiple family members with breast cancer. History of hypokalemia-likely secondary to decreased nutritional intake and diarrhea; potassium in normal range 01/16/2017. No longer taking a potassium supplement. Right hydroureteronephrosis on CT 01/28/2020, status post a cystoscopy/pyelogram 01/28/2020 confirming extrinsic compression of the right ureter, status post stent placement Right ureter stent exchange 06/12/2020 Right ureter stent removed 01/13/2021   8.  Admission 01/07/2021 with Klebsiella pneumoniae urosepsis/bacteremia      Disposition: Ashley Moore appears unchanged.  She is scheduled for cycle 1 Taxol/carboplatin today.  We again reviewed potential toxicities.  She agrees to proceed.  She confirmed taking dexamethasone last night and this morning.  Labs from 03/04/2021 reviewed, adequate to proceed with treatment.  She will return for a nadir CBC on 03/23/2021.  She will return for lab, follow-up, cycle 2 Taxol/carboplatin in 3 weeks.  We are available to see her sooner if needed.  Patient seen with Dr. Benay Spice.    Ned Card ANP/GNP-BC   03/11/2021  8:21 AM This was a  shared visit with Ned Card.  Ms. Ditommaso will complete cycle 1 Taxol/carboplatin today.  We discussed the indication for growth factor support.  She will not receive G-CSF with cycle 1 and will return for a nadir CBC.  I was present for greater than 50% of today's visit.  I performed medical decision making.  Julieanne Manson, MD

## 2021-03-11 NOTE — Progress Notes (Signed)
Taxol to be in 500 mL due to peripheral line.

## 2021-03-12 ENCOUNTER — Telehealth: Payer: Self-pay

## 2021-03-12 NOTE — Telephone Encounter (Signed)
24 hour post chemo call completed. Spoke with sister, Pt not home, Will call if any questions or issues arise.

## 2021-03-15 ENCOUNTER — Encounter (HOSPITAL_BASED_OUTPATIENT_CLINIC_OR_DEPARTMENT_OTHER): Payer: Self-pay | Admitting: Emergency Medicine

## 2021-03-15 ENCOUNTER — Other Ambulatory Visit: Payer: Self-pay

## 2021-03-15 ENCOUNTER — Emergency Department (HOSPITAL_BASED_OUTPATIENT_CLINIC_OR_DEPARTMENT_OTHER)
Admission: EM | Admit: 2021-03-15 | Discharge: 2021-03-15 | Disposition: A | Discharge status: Home / Self Care | Payer: Medicare HMO | Attending: Emergency Medicine | Admitting: Emergency Medicine

## 2021-03-15 DIAGNOSIS — C210 Malignant neoplasm of anus, unspecified: Secondary | ICD-10-CM | POA: Insufficient documentation

## 2021-03-15 DIAGNOSIS — Z8544 Personal history of malignant neoplasm of other female genital organs: Secondary | ICD-10-CM | POA: Insufficient documentation

## 2021-03-15 DIAGNOSIS — K5900 Constipation, unspecified: Secondary | ICD-10-CM | POA: Diagnosis not present

## 2021-03-15 DIAGNOSIS — I10 Essential (primary) hypertension: Secondary | ICD-10-CM | POA: Diagnosis not present

## 2021-03-15 DIAGNOSIS — E039 Hypothyroidism, unspecified: Secondary | ICD-10-CM | POA: Insufficient documentation

## 2021-03-15 DIAGNOSIS — M436 Torticollis: Secondary | ICD-10-CM | POA: Diagnosis not present

## 2021-03-15 DIAGNOSIS — Z79899 Other long term (current) drug therapy: Secondary | ICD-10-CM | POA: Diagnosis not present

## 2021-03-15 DIAGNOSIS — M549 Dorsalgia, unspecified: Secondary | ICD-10-CM | POA: Diagnosis present

## 2021-03-15 DIAGNOSIS — Z87891 Personal history of nicotine dependence: Secondary | ICD-10-CM | POA: Diagnosis not present

## 2021-03-15 LAB — COMPREHENSIVE METABOLIC PANEL
ALT: 15 U/L (ref 0–44)
AST: 27 U/L (ref 15–41)
Albumin: 3.9 g/dL (ref 3.5–5.0)
Alkaline Phosphatase: 56 U/L (ref 38–126)
Anion gap: 8 (ref 5–15)
BUN: 17 mg/dL (ref 8–23)
CO2: 27 mmol/L (ref 22–32)
Calcium: 9.8 mg/dL (ref 8.9–10.3)
Chloride: 100 mmol/L (ref 98–111)
Creatinine, Ser: 0.87 mg/dL (ref 0.44–1.00)
GFR, Estimated: >60 mL/min (ref >60)
Glucose, Bld: 94 mg/dL (ref 70–99)
Potassium: 4.1 mmol/L (ref 3.5–5.1)
Sodium: 135 mmol/L (ref 135–145)
Total Bilirubin: 0.5 mg/dL (ref 0.3–1.2)
Total Protein: 7.2 g/dL (ref 6.5–8.1)

## 2021-03-15 LAB — URINALYSIS, ROUTINE W REFLEX MICROSCOPIC
Bilirubin Urine: NEGATIVE
Glucose, UA: NEGATIVE mg/dL
Ketones, ur: NEGATIVE mg/dL
Leukocytes,Ua: NEGATIVE
Nitrite: NEGATIVE
Protein, ur: NEGATIVE mg/dL
Specific Gravity, Urine: 1.007 (ref 1.005–1.030)
pH: 7 (ref 5.0–8.0)

## 2021-03-15 LAB — CBC WITH DIFFERENTIAL/PLATELET
Abs Immature Granulocytes: 0 10*3/uL (ref 0.00–0.07)
Basophils Absolute: 0 10*3/uL (ref 0.0–0.1)
Basophils Relative: 0 %
Eosinophils Absolute: 0.1 10*3/uL (ref 0.0–0.5)
Eosinophils Relative: 2 %
HCT: 34.9 % — ABNORMAL LOW (ref 36.0–46.0)
Hemoglobin: 10.9 g/dL — ABNORMAL LOW (ref 12.0–15.0)
Immature Granulocytes: 0 %
Lymphocytes Relative: 6 %
Lymphs Abs: 0.3 10*3/uL — ABNORMAL LOW (ref 0.7–4.0)
MCH: 26.7 pg (ref 26.0–34.0)
MCHC: 31.2 g/dL (ref 30.0–36.0)
MCV: 85.3 fL (ref 80.0–100.0)
Monocytes Absolute: 0.1 10*3/uL (ref 0.1–1.0)
Monocytes Relative: 2 %
Neutro Abs: 4 10*3/uL (ref 1.7–7.7)
Neutrophils Relative %: 90 %
Platelets: 222 10*3/uL (ref 150–400)
RBC: 4.09 MIL/uL (ref 3.87–5.11)
RDW: 15.1 % (ref 11.5–15.5)
WBC: 4.4 10*3/uL (ref 4.0–10.5)
nRBC: 0 % (ref 0.0–0.2)

## 2021-03-15 LAB — LIPASE, BLOOD: Lipase: <10 U/L — ABNORMAL LOW (ref 11–51)

## 2021-03-15 MED ORDER — SORBITOL 70 % SOLN
960.0000 mL | TOPICAL_OIL | Freq: Once | ORAL | Status: AC
  Administered 2021-03-15: 960 mL via RECTAL
  Filled 2021-03-15 (×2): qty 240

## 2021-03-15 MED ORDER — POLYETHYLENE GLYCOL 3350 17 G PO PACK: 17.0000 g | PACK | Freq: Every day | ORAL | 0 refills | Status: DC

## 2021-03-15 MED ORDER — SODIUM CHLORIDE 0.9 % IV BOLUS
1000.0000 mL | Freq: Once | INTRAVENOUS | Status: AC
  Administered 2021-03-15: 1000 mL via INTRAVENOUS

## 2021-03-15 MED ORDER — ONDANSETRON HCL 4 MG/2ML IJ SOLN
4.0000 mg | Freq: Once | INTRAMUSCULAR | Status: AC
  Administered 2021-03-15: 4 mg via INTRAVENOUS
  Filled 2021-03-15: qty 2

## 2021-03-15 MED ORDER — LORAZEPAM 2 MG/ML IJ SOLN
1.0000 mg | Freq: Once | INTRAMUSCULAR | Status: AC
  Administered 2021-03-15: 1 mg via INTRAVENOUS
  Filled 2021-03-15: qty 1

## 2021-03-15 MED ORDER — FLEET ENEMA 7-19 GM/118ML RE ENEM
1.0000 | ENEMA | Freq: Once | RECTAL | Status: AC
  Administered 2021-03-15: 1 via RECTAL
  Filled 2021-03-15: qty 1

## 2021-03-15 MED ORDER — MORPHINE SULFATE (PF) 4 MG/ML IV SOLN
4.0000 mg | Freq: Once | INTRAVENOUS | Status: AC
  Administered 2021-03-15: 4 mg via INTRAVENOUS
  Filled 2021-03-15: qty 1

## 2021-03-15 MED ORDER — ACETAMINOPHEN 500 MG PO TABS
1000.0000 mg | ORAL_TABLET | Freq: Once | ORAL | Status: AC
  Administered 2021-03-15: 1000 mg via ORAL
  Filled 2021-03-15: qty 2

## 2021-03-15 MED ORDER — DIAZEPAM 5 MG PO TABS: 5.0000 mg | ORAL_TABLET | Freq: Two times a day (BID) | ORAL | 0 refills | Status: DC

## 2021-03-15 MED ORDER — MAGNESIUM CITRATE PO SOLN
1.0000 | Freq: Once | ORAL | Status: AC
  Administered 2021-03-15: 1 via ORAL
  Filled 2021-03-15: qty 296

## 2021-03-15 MED ORDER — OXYCODONE-ACETAMINOPHEN 5-325 MG PO TABS: 1.0000 | ORAL_TABLET | Freq: Four times a day (QID) | ORAL | 0 refills | Status: DC | PRN

## 2021-03-15 NOTE — ED Notes (Signed)
Pt walked out to lobby and sat down. Registration come in to see if she can leave. Writer took paperwork to pt and discussed if she is ready to leave. She feels she will be ok at home. Daughter present during conversation and understands to pick up medications. If continued pain or other problems develop she is welcome to bring her back. Both verbalize understanding. Pt assisted to car.

## 2021-03-15 NOTE — Discharge Instructions (Addendum)
Take valium instead of ativan.

## 2021-03-15 NOTE — ED Notes (Signed)
Up top bedside commode  Taking several steps.  Small mostly liquid results BM

## 2021-03-15 NOTE — ED Notes (Signed)
Had very small amount liquid brown stool from enema

## 2021-03-15 NOTE — ED Provider Notes (Addendum)
[facility] EMERGENCY DEPT Provider Note   CSN: [id number] Arrival date & time: 03/15/21  1026     History Chief Complaint  Patient presents with   Back Pain   Neck Pain    Ashley Moore is a 70 y.o. female.  Pt presents to the ED today with pain all over.  Pt has a hx of anal cancer and last had chemo on 6/23.  She said her neck and back hurts.  She has also been constipated with no bm in 4 days.  She has tried tylenol and oxycodone for pain, but this has not helped.  Pt denies any f/c.  No n/v.      Past Medical History:  Diagnosis Date   Arthritis    Bowen's disease    excised 1992   Chronic low back pain    Chronic radiation cystitis    DDD (degenerative disc disease), cervical    Family history of breast cancer    Family history of lung cancer    Family history of non-Hodgkin's lymphoma    Family history of ovarian cancer    Headache    Hx of varicella    Hydronephrosis of right kidney    urologist--- dr Tresa Moore,  malignant,  treated with ureter stent   Hypothyroidism    followed by pcp   Lower urinary tract symptoms (LUTS)    01-12-2021  per pt treated with accupuncture at dr Tresa Moore office   Lymphedema of both lower extremities    PICC (peripherally inserted central catheter) in place 01/11/2021   placed at [facility] in [place], [place] for IV antibiotics   Positive blood culture 01/08/2021   Klebsiella Pneumaniae,  treated with daily IV antibiotic   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[facility]) oncologist--- dr Benay Spice   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,  invasive SCC , completed chemo/ radiation 12-23-2016;  recurrent metastatsis retroperitoneal lymphdenopathy,  completed radiation 04-13-2020 residual right ureteral uropathy obstruction   Retroperitoneal lymphadenopathy    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   Sepsis due to Klebsiella pneumoniae ([place]) 01/07/2021   pt admitted to [facility] in [place],  dx sepsis secondary to  acute pyelonephritis with bacteremia , positive blood culture,  discharged 01-11-2021 home daily IV antibiotic   Wears glasses     Patient Active Problem List   Diagnosis Date Noted   Goals of care, counseling/discussion 03/01/2021   SIRS (systemic inflammatory response syndrome) ([place]) 01/13/2021   Pyelonephritis 01/13/2021   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   Genetic testing 01/27/2020   Family history of breast cancer    Family history of lung cancer    Family history of ovarian cancer    Family history of non-Hodgkin's lymphoma    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   Port catheter in place 11/21/2016   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   Anal cancer ([place]) 11/03/2016   Cystitis 05/06/2015   Degenerative cervical disc 04/08/2015   Trigger point of neck 03/26/2015   Pain in joint, ankle and foot 04/23/2013   Visit for preventive health examination 02/13/2013   Routine gynecological examination 02/13/2013   Family history of breast cancer in first degree relative 02/13/2013   Rash and nonspecific skin eruption 02/13/2013   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   VITAMIN D DEFICIENCY 06/25/2008   NECK PAIN 06/02/2008   FOOT PAIN 06/02/2008   Osteopenia 06/02/2008    Past Surgical History:  Procedure Laterality Date   BUNIONECTOMY  Right yrs ago   [place] Right 06/12/2020   Procedure: CYSTOSCOPY WITH RETROGRADE PYELOGRAM/URETERAL STENT EXCHANGE;  Surgeon: Alexis Frock, MD;  Location: [facility];  Service: Urology;  Laterality: Right;   CYSTOSCOPY W/ URETERAL STENT PLACEMENT Right 01/13/2021   Procedure: CYSTOSCOPY WITH RETROGRADE PYELOGRAM/URETERAL STENT REMOVALRIGHT;  Surgeon: Alexis Frock, MD;  Location: [facility];  Service: Urology;  Laterality: Right;  [place], URETEROSCOPY AND STENT PLACEMENT Right 01/28/2020   Procedure: CYSTOSCOPY  WITH RETROGRADE PYELOGRAM, URETEROSCOPY AND STENT PLACEMENT;  Surgeon: Alexis Frock, MD;  Location: [facility];  Service: Urology;  Laterality: Right;   IR GENERIC HISTORICAL  11/14/2016   IR US GUIDE VASC ACCESS RIGHT 11/14/2016 [facility]   IR GENERIC HISTORICAL  11/14/2016   IR FLUORO GUIDE CV LINE RIGHT 11/14/2016 [facility]   IR GENERIC HISTORICAL  12/12/2016   IR US GUIDE VASC ACCESS RIGHT 12/12/2016 [name], MD [facility]   IR GENERIC HISTORICAL  12/12/2016   IR FLUORO GUIDE CV LINE RIGHT 12/12/2016 [name], MD [facility]   SKIN CANCER EXCISION  1990   bowens disease     OB History   No obstetric history on file.    Obstetric Comments  Last mammogram: 09/2018, BIRADS 1 Bone density: 07/2018 Last pap: 2018 - negative, HPV +         Family History  Problem Relation Age of Onset   Lung cancer Mother        lung   Breast cancer Mother 30   Ovarian cancer Mother 54   Pancreatitis Father    Breast cancer Sister 19   Breast cancer Maternal Grandmother 28       breast    Breast cancer Maternal Aunt 75       breast   Melanoma Sister    Breast cancer Sister 90   Non-Hodgkin's lymphoma Paternal Grandmother     Social History   Tobacco Use   Smoking status: Former    Packs/day: 1.00    Years: 25.00    Pack years: 25.00    Types: Cigarettes    Quit date: 06/20/1995    Years since quitting: 25.7   Smokeless tobacco: Never  Vaping Use   Vaping Use: Never used  Substance Use Topics   Alcohol use: Not Currently    Alcohol/week: 0.0 standard drinks    Comment: occasional wine   Drug use: Yes    Types: Marijuana    Comment: 01-12-2021  per pt once a week    Home Medications Prior to Admission medications   Medication Sig Start Date End Date Taking? Authorizing Provider  diazepam (VALIUM) 5 MG tablet Take 1 tablet (5 mg total) by mouth 2 (two) times daily. 03/15/21  Yes Isla Pence, MD  oxyCODONE-acetaminophen (PERCOCET/ROXICET) 5-325 MG tablet Take 1  tablet by mouth every 6 (six) hours as needed for severe pain. 03/15/21  Yes Isla Pence, MD  polyethylene glycol (MIRALAX) 17 g packet Take 17 g by mouth daily. 03/15/21  Yes Isla Pence, MD  acetaminophen (TYLENOL) 500 MG tablet Take 500 mg by mouth in the morning and at bedtime.    [provider]  calcium gluconate 500 MG tablet Take 1 tablet by mouth 3 (three) times daily. Takes 250 mg    [provider]  Cholecalciferol (VITAMIN D3 PO) Take 2,000 Units by mouth daily.    [provider]  conjugated estrogens (  PREMARIN) vaginal cream Place 1 Applicatorful vaginally daily.    [provider]  dexamethasone (DECADRON) 2 MG tablet Take 5 tablets (10 mg total) by mouth as directed. Take 10 mg at 10 pm night before 1st Taxol treatment and repeat at 6 am day of 1st Taxol treatment 03/04/21   [name], MD  diclofenac Sodium (VOLTAREN) 1 % GEL Apply topically 4 (four) times daily. To shoulder 03/04/21   [provider]  levothyroxine (SYNTHROID) 50 MCG tablet TAKE 1 TABLET EVERY DAY 12/17/20   Panosh, Standley Brooking, MD  LORazepam (ATIVAN) 0.5 MG tablet Take 1 tablet (0.5 mg total) by mouth every 12 (twelve) hours as needed for anxiety. 03/01/21   [name], MD  Misc Natural Products [facility] URINARY HEALTH PO) Take 1 capsule by mouth daily.    [provider]  Multiple Vitamins-Minerals (MULTIVITAMIN ADULT) CHEW Chew 1 capsule by mouth daily.    [provider]  MYRBETRIQ 50 MG TB24 tablet Take 50 mg by mouth daily. 01/19/21   [provider]  ondansetron (ZOFRAN) 8 MG tablet Take 1 tablet (8 mg total) by mouth every 8 (eight) hours as needed for nausea or vomiting. Start 72 hours after IV chemotherapy treatment 03/04/21   [name], MD  OVER THE COUNTER MEDICATION Place 1 drop into both eyes daily as needed (dry eyes).    [provider]  OVER THE COUNTER MEDICATION Take 3 tablets by mouth daily. [facility]  by Dr. Ardeth Perfect    [provider]  prochlorperazine (COMPAZINE) 10 MG tablet Take 1 tablet (10 mg total) by mouth every 6 (six) hours as needed for nausea. 03/04/21   [name], MD  traMADol (ULTRAM) 50 MG tablet Take 100 mg by mouth every 6 (six) hours as needed for moderate pain.    [provider]  Turmeric 500 MG CAPS Take 500 mg by mouth daily.    [provider]    Allergies    Benadryl [diphenhydramine], Citrus, Melatonin, Other, Lemon oil, and Penicillins  Review of Systems   Review of Systems  Gastrointestinal:  Positive for constipation.  Musculoskeletal:  Positive for back pain and neck pain.  All other systems reviewed and are negative.  Physical Exam Updated Vital Signs BP (!) 157/80   Pulse 80   Temp 98.2 F (36.8 C) (Oral)   Resp 14   SpO2 98%   Physical Exam Vitals and nursing note reviewed.  Constitutional:      Appearance: Normal appearance.  HENT:     Head: Normocephalic and atraumatic.     Right Ear: External ear normal.     Left Ear: External ear normal.     Nose: Nose normal.     Mouth/Throat:     Mouth: Mucous membranes are moist.     Pharynx: Oropharynx is clear.  Eyes:     Extraocular Movements: Extraocular movements intact.     Conjunctiva/sclera: Conjunctivae normal.     Pupils: Pupils are equal, round, and reactive to light.  Cardiovascular:     Rate and Rhythm: Normal rate and regular rhythm.     Pulses: Normal pulses.     Heart sounds: Normal heart sounds.  Pulmonary:     Effort: Pulmonary effort is normal.     Breath sounds: Normal breath sounds.  Abdominal:     General: Abdomen is flat. Bowel sounds are decreased.     Palpations: Abdomen is soft.  Genitourinary:    Exam  position: Knee-chest position.     Comments: Stenosis at anus.  Stool in vault, but no fecal impaction. Musculoskeletal:        General: Normal range of motion.     Cervical back: Neck supple. Torticollis present. Muscular tenderness  present.  Skin:    General: Skin is warm and dry.     Capillary Refill: Capillary refill takes less than 2 seconds.  Neurological:     General: No focal deficit present.     Mental Status: She is alert and oriented to person, place, and time.  Psychiatric:        Mood and Affect: Mood normal.        Behavior: Behavior normal.    ED Results / Procedures / Treatments   Labs (all labs ordered are listed, but only abnormal results are displayed) Labs Reviewed  CBC WITH DIFFERENTIAL/PLATELET - Abnormal; Notable for the following components:      Result Value   Hemoglobin 10.9 (*)    HCT 34.9 (*)    Lymphs Abs 0.3 (*)    All other components within normal limits  LIPASE, BLOOD - Abnormal; Notable for the following components:   Lipase <10 (*)    All other components within normal limits  URINALYSIS, ROUTINE W REFLEX MICROSCOPIC - Abnormal; Notable for the following components:   Color, Urine COLORLESS (*)    Hgb urine dipstick SMALL (*)    All other components within normal limits  COMPREHENSIVE METABOLIC PANEL    EKG None  Radiology No results found.  Procedures Procedures   Medications Ordered in ED Medications  sorbitol, milk of mag, mineral oil, glycerin (SMOG) enema (has no administration in time range)  morphine 4 MG/ML injection 4 mg (4 mg Intravenous Given 03/15/21 1106)  ondansetron (ZOFRAN) injection 4 mg (4 mg Intravenous Given 03/15/21 1105)  sodium chloride 0.9 % bolus 1,000 mL (0 mLs Intravenous Stopped 03/15/21 1245)  sodium phosphate (FLEET) 7-19 GM/118ML enema 1 enema (1 enema Rectal Given 03/15/21 1140)  magnesium citrate solution 1 Bottle (1 Bottle Oral Given 03/15/21 1133)  LORazepam (ATIVAN) injection 1 mg (1 mg Intravenous Given 03/15/21 1323)  acetaminophen (TYLENOL) tablet 1,000 mg (1,000 mg Oral Given 03/15/21 1428)    ED Course  I have reviewed the triage vital signs and the nursing notes.  Pertinent labs & imaging results that were available  during my care of the patient were reviewed by me and considered in my medical decision making (see chart for details).    MDM Rules/Calculators/A&P                          Pain is much improved.  She has had a good bowel movement with fleet, smog and mgci.  She is much more mobile.  She is stable for d/c.  Return if worse.   Final Clinical Impression(s) / ED Diagnoses Final diagnoses:  Constipation, unspecified constipation type  Torticollis  Anal cancer ([place])    Rx / DC Orders ED Discharge Orders          Ordered    diazepam (VALIUM) 5 MG tablet  2 times daily        03/15/21 1416    oxyCODONE-acetaminophen (PERCOCET/ROXICET) 5-325 MG tablet  Every 6 hours PRN        03/15/21 1418    polyethylene glycol (MIRALAX) 17 g packet  Daily        03/15/21 1418  Isla Pence, MD 03/15/21 1436    Isla Pence, MD 03/15/21 1459

## 2021-03-15 NOTE — ED Triage Notes (Addendum)
Pt arrives to ED via [facility] with c/o of x4 days of severe back and neck pain. The pain comes and goes and is described as sharp. Alleviating factors include heat packs and laying flat. Standing up makes the pain worse.Tylenol and oxycodone have not been able to control pain. Pt reports no BM in x4 days and increased urinary frequency.

## 2021-03-15 NOTE — ED Notes (Signed)
Tolerated about half volume of SMOG enema for small soft BM in bed.  On bedside commode at present

## 2021-03-16 ENCOUNTER — Encounter: Payer: Medicare HMO | Admitting: Dietician

## 2021-03-18 NOTE — Patient Instructions (Addendum)
Hi Ashley Moore,  It was great to speak with you again! Below is a summary of some of the topics we discussed.   Please reach out to me if you have any questions or need anything before our follow up!  Best, Ashley Moore  Ashley Moore, PharmD, [facility] [place]   Visit Information   Goals Addressed   None    Patient Care Plan: CCM Pharmacy Care Plan     Problem Identified: Problem: Hypothyroidism, Osteoporosis, Overactive Bladder, and Pain      Long-Range Goal: Patient-Specific Goal   Start Date: 03/03/2021  Expected End Date: 03/03/2022  This Visit's Progress: On track  Priority: High  Note:   Current Barriers:  Unable to independently monitor therapeutic efficacy  Pharmacist Clinical Goal(s):  Patient will achieve adherence to monitoring guidelines and medication adherence to achieve therapeutic efficacy through collaboration with PharmD and provider.   Interventions: 1:1 collaboration with Ashley Moore, Ashley Brooking, MD regarding development and update of comprehensive plan of care as evidenced by provider attestation and co-signature Inter-disciplinary care team collaboration (see longitudinal plan of care) Comprehensive medication review performed; medication list updated in electronic medical record  Osteoporosis (Goal prevent fractures) -Not ideally controlled -Last DEXA Scan: 07/2018               T-Score femoral neck: RFN -2.7, LFN -2.4             T-Score total hip: n/a             T-Score lumbar spine: 1.3             T-Score forearm radius: n/a             10-year probability of major osteoporotic fracture: n/a             10-year probability of hip fracture: n/a -Patient is a candidate for pharmacologic treatment due to T-Score < -2.5 in femoral neck -Current treatment  Calcium-vitamin D 500-200 mg-unit 1 tablet daily Vitamin D 2000 units daily -Medications previously tried: none  -Recommend [phone] units of  vitamin D daily. Recommend 1200 mg of calcium daily from dietary and supplemental sources. Recommend weight-bearing and muscle strengthening exercises for building and maintaining bone density. - Patient does not consume much calcium in her diet as she drinks almond milk, occasionally yogurt or cheese and no nuts or seeds  Hypothyroidism (Goal: TSH 0.35-4.5) -Controlled -Current treatment  Levothyroxine 50 mcg 1 tablet daily -Medications previously tried: none -Recommended to continue current medication  Anxiety (Goal: minimize symptoms) -Not ideally controlled -Current treatment  Lorazepem 0.5 mg as needed -Medications previously tried: none  -Counseled on limiting use due to sedation Patient is using around the clock  Overactive bladder (Goal: minimize symptoms) -Controlled -Current treatment  Myrbetriq 50 mg 1 tablet daily -Medications previously tried: none  -Recommended to continue current medication  Pain (Goal: minimize pain) -Uncontrolled -Current treatment  Tramadol 50 mg 1 tablet as needed Tylenol 500 mg 2 tablets daily -Medications previously tried: none  -Counseled on Using tramadol sparingly; maximum recommended dose of Tylenol 3,000 mg/day Recommended  diclofenac gel with lower risk of bleeding and pain control or capsaicin cream for joint pain  Health Maintenance -Vaccine gaps: shingles -Current therapy:  Turmeric 550 mg 1 capsule daily Premarin cream insert 1 applicatorful daily -Educated on Cost vs benefit of each product must be carefully weighed by individual consumer -Patient is satisfied with current therapy and denies issues -Recommended to continue current  medication  Patient Goals/Self-Care Activities Patient will:  - take medications as prescribed target a minimum of 150 minutes of moderate intensity exercise weekly  Follow Up Plan: Telephone follow up appointment with care management team member scheduled for: 6 months       Patient  verbalizes understanding of instructions provided today and agrees to view in [place].  Telephone follow up appointment with pharmacy team member scheduled for: 6 months  Viona Gilmore, [facility]

## 2021-03-19 ENCOUNTER — Encounter: Payer: Medicare HMO | Admitting: Physical Therapy

## 2021-03-23 ENCOUNTER — Other Ambulatory Visit: Payer: Self-pay | Admitting: Nurse Practitioner

## 2021-03-23 ENCOUNTER — Inpatient Hospital Stay: Payer: Medicare HMO | Attending: Nurse Practitioner

## 2021-03-23 ENCOUNTER — Other Ambulatory Visit: Payer: Self-pay

## 2021-03-23 ENCOUNTER — Telehealth: Payer: Self-pay

## 2021-03-23 DIAGNOSIS — C778 Secondary and unspecified malignant neoplasm of lymph nodes of multiple regions: Secondary | ICD-10-CM | POA: Diagnosis not present

## 2021-03-23 DIAGNOSIS — C210 Malignant neoplasm of anus, unspecified: Secondary | ICD-10-CM | POA: Diagnosis not present

## 2021-03-23 DIAGNOSIS — D701 Agranulocytosis secondary to cancer chemotherapy: Secondary | ICD-10-CM | POA: Diagnosis not present

## 2021-03-23 DIAGNOSIS — Z5111 Encounter for antineoplastic chemotherapy: Secondary | ICD-10-CM | POA: Insufficient documentation

## 2021-03-23 LAB — CBC WITH DIFFERENTIAL (CANCER CENTER ONLY)
Abs Immature Granulocytes: 0 10*3/uL (ref 0.00–0.07)
Basophils Absolute: 0 10*3/uL (ref 0.0–0.1)
Basophils Relative: 1 %
Eosinophils Absolute: 0.1 10*3/uL (ref 0.0–0.5)
Eosinophils Relative: 5 %
HCT: 32.5 % — ABNORMAL LOW (ref 36.0–46.0)
Hemoglobin: 10 g/dL — ABNORMAL LOW (ref 12.0–15.0)
Immature Granulocytes: 0 %
Lymphocytes Relative: 32 %
Lymphs Abs: 0.4 10*3/uL — ABNORMAL LOW (ref 0.7–4.0)
MCH: 26.3 pg (ref 26.0–34.0)
MCHC: 30.8 g/dL (ref 30.0–36.0)
MCV: 85.5 fL (ref 80.0–100.0)
Monocytes Absolute: 0.3 10*3/uL (ref 0.1–1.0)
Monocytes Relative: 30 %
Neutro Abs: 0.3 10*3/uL — CL (ref 1.7–7.7)
Neutrophils Relative %: 32 %
Platelet Count: 202 10*3/uL (ref 150–400)
RBC: 3.8 MIL/uL — ABNORMAL LOW (ref 3.87–5.11)
RDW: 15 % (ref 11.5–15.5)
WBC Count: 1.1 10*3/uL — ABNORMAL LOW (ref 4.0–10.5)
nRBC: 0 % (ref 0.0–0.2)

## 2021-03-23 MED ORDER — CIPROFLOXACIN HCL 500 MG PO TABS: 500.0000 mg | ORAL_TABLET | Freq: Two times a day (BID) | ORAL | 0 refills | Status: AC

## 2021-03-23 NOTE — Telephone Encounter (Signed)
Called made pt aware of most recent cbc/diff results low ANC per provider neutropenic precautions reviewed made pt aware cipro called into pharmacy take 1 tab BID po X 5 days pt understands she agree to repeat labs Friday July 8th 2022

## 2021-03-24 ENCOUNTER — Telehealth: Payer: Self-pay

## 2021-03-24 NOTE — Telephone Encounter (Signed)
Pt called stated she thought cipro was making her dizzy however when speaking with her further found with food pt feels better no true neg reaction to cipro no further c/o GI upset reviewed with pt negative s/e when to call 911 reminded to call for any questions concerns or changes pt in office tomorrow for appt

## 2021-03-25 ENCOUNTER — Encounter: Payer: Medicare HMO | Admitting: Physical Therapy

## 2021-03-26 ENCOUNTER — Other Ambulatory Visit: Payer: Self-pay

## 2021-03-26 ENCOUNTER — Inpatient Hospital Stay: Payer: Medicare HMO

## 2021-03-26 ENCOUNTER — Telehealth: Payer: Self-pay

## 2021-03-26 DIAGNOSIS — C210 Malignant neoplasm of anus, unspecified: Secondary | ICD-10-CM

## 2021-03-26 DIAGNOSIS — Z5111 Encounter for antineoplastic chemotherapy: Secondary | ICD-10-CM | POA: Diagnosis not present

## 2021-03-26 DIAGNOSIS — D701 Agranulocytosis secondary to cancer chemotherapy: Secondary | ICD-10-CM | POA: Diagnosis not present

## 2021-03-26 DIAGNOSIS — C778 Secondary and unspecified malignant neoplasm of lymph nodes of multiple regions: Secondary | ICD-10-CM | POA: Diagnosis not present

## 2021-03-26 LAB — CBC WITH DIFFERENTIAL (CANCER CENTER ONLY)
Abs Immature Granulocytes: 0.01 10*3/uL (ref 0.00–0.07)
Basophils Absolute: 0 10*3/uL (ref 0.0–0.1)
Basophils Relative: 1 %
Eosinophils Absolute: 0 10*3/uL (ref 0.0–0.5)
Eosinophils Relative: 3 %
HCT: 33.1 % — ABNORMAL LOW (ref 36.0–46.0)
Hemoglobin: 10.3 g/dL — ABNORMAL LOW (ref 12.0–15.0)
Immature Granulocytes: 1 %
Lymphocytes Relative: 31 %
Lymphs Abs: 0.4 10*3/uL — ABNORMAL LOW (ref 0.7–4.0)
MCH: 26.8 pg (ref 26.0–34.0)
MCHC: 31.1 g/dL (ref 30.0–36.0)
MCV: 86.2 fL (ref 80.0–100.0)
Monocytes Absolute: 0.3 10*3/uL (ref 0.1–1.0)
Monocytes Relative: 24 %
Neutro Abs: 0.6 10*3/uL — ABNORMAL LOW (ref 1.7–7.7)
Neutrophils Relative %: 40 %
Platelet Count: 193 10*3/uL (ref 150–400)
RBC: 3.84 MIL/uL — ABNORMAL LOW (ref 3.87–5.11)
RDW: 15.1 % (ref 11.5–15.5)
WBC Count: 1.4 10*3/uL — ABNORMAL LOW (ref 4.0–10.5)
nRBC: 0 % (ref 0.0–0.2)

## 2021-03-26 NOTE — Telephone Encounter (Signed)
Called spoke with resident concerning most recent WBC improving advises pt to complete Cipro call for s/s of infection pt states she understands.

## 2021-03-26 NOTE — Telephone Encounter (Signed)
-----   Message from [name], NP sent at 03/26/2021  2:28 PM EDT ----- Please let her know the white count is better but remains low.  Continue Cipro.  Follow-up as scheduled.  Call with fever, chills, other signs of infection.

## 2021-03-28 ENCOUNTER — Other Ambulatory Visit: Payer: Self-pay | Admitting: Oncology

## 2021-03-30 ENCOUNTER — Other Ambulatory Visit: Payer: Self-pay

## 2021-03-30 ENCOUNTER — Encounter: Payer: Self-pay | Admitting: Rehabilitative and Restorative Service Providers"

## 2021-03-30 ENCOUNTER — Ambulatory Visit: Payer: Medicare HMO | Admitting: Rehabilitative and Restorative Service Providers"

## 2021-03-30 DIAGNOSIS — M6281 Muscle weakness (generalized): Secondary | ICD-10-CM

## 2021-03-30 DIAGNOSIS — R293 Abnormal posture: Secondary | ICD-10-CM

## 2021-03-30 DIAGNOSIS — R600 Localized edema: Secondary | ICD-10-CM | POA: Diagnosis not present

## 2021-03-30 DIAGNOSIS — G8929 Other chronic pain: Secondary | ICD-10-CM

## 2021-03-30 DIAGNOSIS — M25511 Pain in right shoulder: Secondary | ICD-10-CM

## 2021-03-30 NOTE — Therapy (Signed)
[facility] Therapy [address] [place], [place], [address] Phone: [phone]   Fax:  [phone]  Physical Therapy Treatment  Patient Details  Name: Ashley Moore MRN: [id number] Date of Birth: 1951/08/12 Referring Provider (PT): [name] Persons PA-C   Encounter Date: 03/30/2021   PT End of Session - 03/30/21 1638     Visit Number 2    Number of Visits 12    Date for PT Re-Evaluation 05/10/21   extended for HUMANA timeframe approval   Authorization Type HUMANA 12 visits until 05/10/2021    Authorization Time Period 05/10/2021    Authorization - Visit Number 2    Authorization - Number of Visits 12    Progress Note Due on Visit 10    PT Start Time 1558    PT Stop Time 1637    PT Time Calculation (min) 39 min    Activity Tolerance Patient tolerated treatment well    Behavior During Therapy [facility] for tasks assessed/performed             Past Medical History:  Diagnosis Date   Arthritis    Bowen's disease    excised 1992   Chronic low back pain    Chronic radiation cystitis    DDD (degenerative disc disease), cervical    Family history of breast cancer    Family history of lung cancer    Family history of non-Hodgkin's lymphoma    Family history of ovarian cancer    Headache    Hx of varicella    Hydronephrosis of right kidney    urologist--- dr Tresa Moore,  malignant,  treated with ureter stent   Hypothyroidism    followed by pcp   Lower urinary tract symptoms (LUTS)    01-12-2021  per pt treated with accupuncture at dr Tresa Moore office   Lymphedema of both lower extremities    PICC (peripherally inserted central catheter) in place 01/11/2021   placed at [facility] in [place], [place] for IV antibiotics   Positive blood culture 01/08/2021   Klebsiella Pneumaniae,  treated with daily IV antibiotic   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,  invasive SCC , completed chemo/ radiation 12-23-2016;  recurrent  metastatsis retroperitoneal lymphdenopathy,  completed radiation 04-13-2020 residual right ureteral uropathy obstruction   Retroperitoneal lymphadenopathy    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   Sepsis due to Klebsiella pneumoniae ([place]) 01/07/2021   pt admitted to [facility] in [place],  dx sepsis secondary to acute pyelonephritis with bacteremia , positive blood culture,  discharged 01-11-2021 home daily IV antibiotic   Wears glasses     Past Surgical History:  Procedure Laterality Date   BUNIONECTOMY Right yrs ago   [place] Right 06/12/2020   Procedure: CYSTOSCOPY WITH RETROGRADE PYELOGRAM/URETERAL STENT EXCHANGE;  Surgeon: Alexis Frock, MD;  Location: [facility];  Service: Urology;  Laterality: Right;   CYSTOSCOPY W/ URETERAL STENT PLACEMENT Right 01/13/2021   Procedure: CYSTOSCOPY WITH RETROGRADE PYELOGRAM/URETERAL STENT REMOVALRIGHT;  Surgeon: Alexis Frock, MD;  Location: [facility];  Service: Urology;  Laterality: Right;  [place], URETEROSCOPY AND STENT PLACEMENT Right 01/28/2020   Procedure: CYSTOSCOPY WITH RETROGRADE PYELOGRAM, URETEROSCOPY AND STENT PLACEMENT;  Surgeon: Alexis Frock, MD;  Location: [facility];  Service: Urology;  Laterality: Right;   IR GENERIC HISTORICAL  11/14/2016   IR US GUIDE VASC ACCESS  RIGHT 11/14/2016 [facility]   IR GENERIC HISTORICAL  11/14/2016   IR FLUORO GUIDE CV LINE RIGHT 11/14/2016 [facility]   IR GENERIC HISTORICAL  12/12/2016   IR US GUIDE VASC ACCESS RIGHT 12/12/2016 [name], MD [facility]   IR GENERIC HISTORICAL  12/12/2016   IR FLUORO GUIDE CV LINE RIGHT 12/12/2016 [name], MD [facility]   SKIN CANCER EXCISION  1990   bowens disease    There were no vitals filed for this visit.   Subjective Assessment - 03/30/21 1558     Subjective Pt. indicated having some other medical  complications since last visit that resulted in some troubles of pain throughout body and decreased activity overall.  Pt. indicated she has been doing some exercise routine in last few days including HEP and also walking.    Pertinent History Recent dx of metastatic rectal cancer with chemotherapy ongoing  DDD cervical, history of chronic back pain,    Limitations Lifting;House hold activities    Patient Stated Goals Reduce pain, improved Rt arm movements.    Currently in Pain? Yes    Pain Score 2     Pain Location Shoulder    Pain Orientation Right    Pain Descriptors / Indicators Sore    Pain Onset More than a month ago    Aggravating Factors  sometimes after HEP pain, reaching/lifting    Pain Relieving Factors some movement has helped.                [facility] PT Assessment - 03/30/21 0001       Assessment   Medical Diagnosis Rt shoulder/arm pain    Referring Provider (PT) [name] Persons PA-C    Onset Date/Surgical Date 12/18/20    Hand Dominance Left      AROM   Overall AROM Comments Below measured in supine    Right Shoulder Flexion 150 Degrees   end range pain   Right Shoulder ABduction 100 Degrees   pain at end range   Right Shoulder Internal Rotation 45 Degrees   in 45 deg abduction in supine   Right Shoulder External Rotation 30 Degrees   in 45 deg abduction in supine                          [facility] Adult PT Treatment/Exercise - 03/30/21 0001       Shoulder Exercises: Supine   Other Supine Exercises supine wand flexion 2-3 second hold x 10, ER in 45 deg abduction 5 sec hold x 10    Other Supine Exercises supine wand protraction bilateral 5 sec hold x 10      Shoulder Exercises: Seated   Other Seated Exercises seated scapular retraction 5 sec hold x 10      Manual Therapy   Manual therapy comments Rt shoulder PROM to tolerance, g2-g3 inferior joint mobs in flexion, scaption and abduction, sustained posterior glide c ER mobilization c movement in  45 deg abduction Rt shoulder                    PT Education - 03/30/21 1634     Education Details HEP progression    Person(s) Educated Patient    Methods Explanation;Demonstration;Verbal cues;Handout    Comprehension Returned demonstration;Verbalized understanding              PT Short Term Goals - 03/30/21 1603       PT SHORT TERM GOAL #  1   Title Patient will demonstrate independent use of home exercise program to maintain progress from in clinic treatments.    Time 3    Period Weeks    Status On-going    Target Date 03/26/21               PT Long Term Goals - 03/05/21 1100       PT LONG TERM GOAL #1   Title Patient will demonstrate/report pain at worst less than or equal to 2/10 to facilitate minimal limitation in daily activity secondary to pain symptoms.    Time 10    Period Weeks    Status New    Target Date 05/14/21      PT LONG TERM GOAL #2   Title Patient will demonstrate independent use of home exercise program to facilitate ability to maintain/progress functional gains from skilled physical therapy services.    Time 10    Period Weeks    Status New    Target Date 05/14/21      PT LONG TERM GOAL #3   Title Patient will demonstrate Rt Ducor joint mobility WFL to facilitate usual self care, dressing, reaching overhead at PLOF s limitation due to symptoms.    Time 10    Period Weeks    Status New    Target Date 05/14/21      PT LONG TERM GOAL #4   Title Patient will demonstrate Rt UE MMT 5/5 throughout to facilitate usual lifting, carrying in functional activity to PLOF s limitation.    Time 10    Period Weeks    Status New    Target Date 05/14/21      PT LONG TERM GOAL #5   Title Pt. will demonstrate FOTO outcome > or = 64% to indicate reduced disability due to condition.    Time 10    Period Weeks    Status New    Target Date 05/14/21                   Plan - 03/30/21 1637     Clinical Impression Statement Overall  progression initially limited by recent hospital stay.  Pt. did show some mild gains in mobility but still presents c indications of capsular and periscapular myofascial restriction for mobility at this time.    Personal Factors and Comorbidities Comorbidity 3+    Comorbidities Recent dx of metastatic rectal cancer with chemotherapy ongoing  DDD cervical, history of chronic back pain,    Examination-Activity Limitations Bathing;Sleep;Bed Mobility;Carry;Dressing;Hygiene/Grooming;Lift;Reach Overhead    Examination-Participation Restrictions Cleaning;Community Activity;Shop;Occupation;Meal Prep;Laundry    Stability/Clinical Decision Making Stable/Uncomplicated    Rehab Potential Good    PT Frequency 2x / week    PT Duration Other (comment)   10 weeks (extended for HUMANA approval time)   PT Treatment/Interventions ADLs/Self Care Home Management;Cryotherapy;Moist Heat;Traction;Balance training;Therapeutic exercise;Therapeutic activities;Functional mobility training;Stair training;Gait training;DME Instruction;Ultrasound;Neuromuscular re-education;Patient/family education;Passive range of motion;Spinal Manipulations;Joint Manipulations;Dry needling;Taping;Vasopneumatic Device;Manual techniques    PT Next Visit Plan Progress passive/AAROM with goals of introducting active movement in gravity reduced positioning, compression to Rt infraspinatus    PT Home Exercise Plan [email or web address]    Consulted and Agree with Plan of Care Patient             Patient will benefit from skilled therapeutic intervention in order to improve the following deficits and impairments:  Decreased endurance, Hypomobility, Pain, Impaired UE functional use, Increased fascial restricitons, Decreased strength, Decreased activity tolerance, Increased edema, Decreased mobility,  Decreased range of motion, Impaired flexibility, Improper body mechanics, Impaired perceived functional ability, Postural dysfunction  Visit Diagnosis: Chronic  right shoulder pain  Muscle weakness (generalized)  Abnormal posture  Localized edema     Problem List Patient Active Problem List   Diagnosis Date Noted   Goals of care, counseling/discussion 03/01/2021   SIRS (systemic inflammatory response syndrome) ([place]) 01/13/2021   Pyelonephritis 01/13/2021   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   Genetic testing 01/27/2020   Family history of breast cancer    Family history of lung cancer    Family history of ovarian cancer    Family history of non-Hodgkin's lymphoma    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   Port catheter in place 11/21/2016   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   Anal cancer ([place]) 11/03/2016   Cystitis 05/06/2015   Degenerative cervical disc 04/08/2015   Trigger point of neck 03/26/2015   Pain in joint, ankle and foot 04/23/2013   Visit for preventive health examination 02/13/2013   Routine gynecological examination 02/13/2013   Family history of breast cancer in first degree relative 02/13/2013   Rash and nonspecific skin eruption 02/13/2013   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   VITAMIN D DEFICIENCY 06/25/2008   NECK PAIN 06/02/2008   FOOT PAIN 06/02/2008   Osteopenia 06/02/2008    Ashley Moore, PT, DPT, OCS, ATC 03/30/21  4:40 PM    [facility] [address] [place], [place], [address] Phone: [phone]   Fax:  [phone]  Name: ARRIA NAIM MRN: [id number] Date of Birth: 1951/04/04

## 2021-03-30 NOTE — Patient Instructions (Signed)
Access Code: [email or web address] URL: [email or web address] Date: 03/30/2021 Prepared by: Scot Jun  Exercises Supine Shoulder Flexion Extension AAROM with Dowel - 2-3 x daily - 7 x weekly - 3 sets - 10 reps - 5 hold Supine Shoulder Flexion PROM (Mirrored) - 2-3 x daily - 7 x weekly - 1-2 sets - 10 reps - 5 hold Seated Scapular Retraction - 2 x daily - 7 x weekly - 2 sets - 10 reps - 5 hold Standing Circular Shoulder Pendulum Supported with Arm Bent - 2 x daily - 7 x weekly - 2-3 sets - 10 reps Supine Shoulder Protraction with Dowel - 2 x daily - 7 x weekly - 1 sets - 10 reps - 5 hold Supine Shoulder External Rotation in 45 Degrees Abduction AAROM with Dowel (Mirrored) - 2 x daily - 7 x weekly - 1-2 sets - 10 reps - 5 hold

## 2021-03-31 ENCOUNTER — Inpatient Hospital Stay (HOSPITAL_BASED_OUTPATIENT_CLINIC_OR_DEPARTMENT_OTHER): Payer: Medicare HMO | Admitting: Nurse Practitioner

## 2021-03-31 ENCOUNTER — Encounter: Payer: Self-pay | Admitting: Nurse Practitioner

## 2021-03-31 ENCOUNTER — Inpatient Hospital Stay: Payer: Medicare HMO | Admitting: Nurse Practitioner

## 2021-03-31 ENCOUNTER — Inpatient Hospital Stay: Payer: Medicare HMO

## 2021-03-31 VITALS — BP 157/81 | HR 92 | Temp 97.7°F | Resp 18 | Wt 167.8 lb

## 2021-03-31 DIAGNOSIS — D701 Agranulocytosis secondary to cancer chemotherapy: Secondary | ICD-10-CM | POA: Diagnosis not present

## 2021-03-31 DIAGNOSIS — Z5111 Encounter for antineoplastic chemotherapy: Secondary | ICD-10-CM | POA: Diagnosis not present

## 2021-03-31 DIAGNOSIS — C210 Malignant neoplasm of anus, unspecified: Secondary | ICD-10-CM | POA: Diagnosis not present

## 2021-03-31 DIAGNOSIS — C778 Secondary and unspecified malignant neoplasm of lymph nodes of multiple regions: Secondary | ICD-10-CM | POA: Diagnosis not present

## 2021-03-31 LAB — CBC WITH DIFFERENTIAL (CANCER CENTER ONLY)
Abs Immature Granulocytes: 0.02 10*3/uL (ref 0.00–0.07)
Basophils Absolute: 0 10*3/uL (ref 0.0–0.1)
Basophils Relative: 1 %
Eosinophils Absolute: 0 10*3/uL (ref 0.0–0.5)
Eosinophils Relative: 1 %
HCT: 35.2 % — ABNORMAL LOW (ref 36.0–46.0)
Hemoglobin: 10.9 g/dL — ABNORMAL LOW (ref 12.0–15.0)
Immature Granulocytes: 1 %
Lymphocytes Relative: 12 %
Lymphs Abs: 0.5 10*3/uL — ABNORMAL LOW (ref 0.7–4.0)
MCH: 26.7 pg (ref 26.0–34.0)
MCHC: 31 g/dL (ref 30.0–36.0)
MCV: 86.3 fL (ref 80.0–100.0)
Monocytes Absolute: 0.5 10*3/uL (ref 0.1–1.0)
Monocytes Relative: 13 %
Neutro Abs: 2.9 10*3/uL (ref 1.7–7.7)
Neutrophils Relative %: 72 %
Platelet Count: 205 10*3/uL (ref 150–400)
RBC: 4.08 MIL/uL (ref 3.87–5.11)
RDW: 15.9 % — ABNORMAL HIGH (ref 11.5–15.5)
WBC Count: 3.9 10*3/uL — ABNORMAL LOW (ref 4.0–10.5)
nRBC: 0 % (ref 0.0–0.2)

## 2021-03-31 LAB — CMP (CANCER CENTER ONLY)
ALT: 12 U/L (ref 0–44)
AST: 21 U/L (ref 15–41)
Albumin: 4 g/dL (ref 3.5–5.0)
Alkaline Phosphatase: 70 U/L (ref 38–126)
Anion gap: 9 (ref 5–15)
BUN: 16 mg/dL (ref 8–23)
CO2: 26 mmol/L (ref 22–32)
Calcium: 9.5 mg/dL (ref 8.9–10.3)
Chloride: 104 mmol/L (ref 98–111)
Creatinine: 0.92 mg/dL (ref 0.44–1.00)
GFR, Estimated: >60 mL/min (ref >60)
Glucose, Bld: 79 mg/dL (ref 70–99)
Potassium: 3.6 mmol/L (ref 3.5–5.1)
Sodium: 139 mmol/L (ref 135–145)
Total Bilirubin: 0.3 mg/dL (ref 0.3–1.2)
Total Protein: 7.1 g/dL (ref 6.5–8.1)

## 2021-03-31 MED ORDER — FAMOTIDINE 20 MG IN NS 100 ML IVPB
20.0000 mg | Freq: Once | INTRAVENOUS | Status: AC
  Administered 2021-03-31: 20 mg via INTRAVENOUS
  Filled 2021-03-31: qty 100

## 2021-03-31 MED ORDER — SODIUM CHLORIDE 0.9 % IV SOLN
10.0000 mg | Freq: Once | INTRAVENOUS | Status: AC
  Administered 2021-03-31: 10 mg via INTRAVENOUS
  Filled 2021-03-31: qty 1

## 2021-03-31 MED ORDER — PALONOSETRON HCL INJECTION 0.25 MG/5ML
0.2500 mg | Freq: Once | INTRAVENOUS | Status: AC
  Administered 2021-03-31: 0.25 mg via INTRAVENOUS
  Filled 2021-03-31: qty 5

## 2021-03-31 MED ORDER — SODIUM CHLORIDE 0.9 % IV SOLN
Freq: Once | INTRAVENOUS | Status: AC
Start: 2021-03-31 — End: 2021-03-31
  Filled 2021-03-31: qty 250

## 2021-03-31 MED ORDER — LORAZEPAM 0.5 MG PO TABS: 0.5000 mg | ORAL_TABLET | Freq: Two times a day (BID) | ORAL | 0 refills | Status: DC | PRN

## 2021-03-31 MED ORDER — SODIUM CHLORIDE 0.9 % IV SOLN
150.0000 mg/m2 | Freq: Once | INTRAVENOUS | Status: AC
  Administered 2021-03-31: 276 mg via INTRAVENOUS
  Filled 2021-03-31: qty 46

## 2021-03-31 MED ORDER — SODIUM CHLORIDE 0.9 % IV SOLN
353.2000 mg | Freq: Once | INTRAVENOUS | Status: AC
  Administered 2021-03-31: 350 mg via INTRAVENOUS
  Filled 2021-03-31: qty 35

## 2021-03-31 MED ORDER — SODIUM CHLORIDE 0.9 % IV SOLN
150.0000 mg | Freq: Once | INTRAVENOUS | Status: AC
  Administered 2021-03-31: 150 mg via INTRAVENOUS
  Filled 2021-03-31: qty 5

## 2021-03-31 NOTE — Progress Notes (Signed)
[facility] OFFICE PROGRESS NOTE   Diagnosis: Anal cancer  INTERVAL HISTORY:   Ashley Moore returns as scheduled.  She completed cycle 1 Taxol/carboplatin 03/11/2021.  She denies significant nausea/vomiting.  No mouth sores.  No diarrhea.  She was seen in the emergency department on 03/15/2021 with generalized achy pain and constipation.  Both have improved.  Objective:  Vital signs in last 24 hours:  Blood pressure (!) 157/81, pulse 92, temperature 97.7 F (36.5 C), temperature source Temporal, resp. rate 18, weight 167 lb 12.8 oz (76.1 kg), SpO2 100 %.    HEENT: No thrush or ulcers. Resp: Lungs clear bilaterally. Cardio: Regular rate and rhythm. GI: Abdomen soft and nontender.  No hepatomegaly.  No mass. Vascular: Pitting edema lower leg bilaterally.    Lab Results:  Lab Results  Component Value Date   WBC 3.9 (L) 03/31/2021   HGB 10.9 (L) 03/31/2021   HCT 35.2 (L) 03/31/2021   MCV 86.3 03/31/2021   PLT 205 03/31/2021   NEUTROABS 2.9 03/31/2021    Medications: I have reviewed the patient's current medications.  Assessment/Plan:  Anal cancer CT abdomen/pelvis 10/21/2016-thickening of the anus extending to the junction between the anus and rectum with a large stool ball in the rectum and mild fat stranding posterior to the rectum. Enlarged lymph nodes in the right and left inguinal regions. A few mildly prominent nodes seen posterior to the rectum. 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 PET scan 03/12/7627-BTDVVOHY hypermetabolic anal mass with hypermetabolic metastases to the groin region bilaterally, left pelvic sidewall and presacral space. Initiation of radiation and cycle 1 5-FU/mitomycin C 11/14/2016 Cycle 2 5-FU/mitomycin C 12/12/2016 (5-FU dose reduced due to mucositis, diarrhea, skin breakdown) Radiation completed 12/23/2016 CT abdomen/pelvis 04/14/2017-resolution of anal mass and bilateral inguinal lymphadenopathy. No  residual tumor seen. CT abdomen/pelvis 01/28/2020-right hydroureteronephrosis, transition at the level of the mid right ureter, retroperitoneal lymphadenopathy CT renal stone study 01/28/2020-severe right hydronephrosis and proximal right hydroureter, 8 mm area of increased attenuation in the proximal right ureter-stone versus soft tissue mass PET 02/13/2020-hypermetabolic right retroperitoneal nodal metastases in the aortocaval and posterior pericaval chains, nonspecific mild anal wall hypermetabolism, right nephroureteral stent 02/21/2020 anorectal exam per Dr. Ashley Jacobs radiation changes of the skin around the anal margin.  Posterior midline smooth scarring.  Mildly stenotic.  Stenosis seems better.  No palpable concerning lesions.  Entire anal canal and distal rectum feels smooth and healthy.  Partial anoscopy performed with no lesions in the anal canal. Xeloda/radiation beginning 03/02/2020, completed 04/13/2020 CT abdomen/pelvis 06/17/2020-resolution of right retroperitoneal lymphadenopathy, no remaining pathologically enlarged lymph nodes, residual mild right hydronephrosis, no evidence of progressive disease, stable anal wall thickening Digital rectal exam by Dr. Quentin Cornwall 08/26/2020-posterior midline smooth scarring.  Mildly stenotic.  Stenosis seems better.  No palpable concerning lesions.  Anal canal feels smooth without palpable concern.  Unable to tolerate anoscopy.  Next visit in 6 months for surveillance. CT abdomen/pelvis 10/28/2020-no evidence of local recurrence or metastatic disease.  Stable mild rectal wall thickening.  New diffuse bladder wall thickening possibly related to prior radiation CT abdomen/pelvis 01/16/2021- recurrent right-sided hydronephrosis and proximal right hydroureter status post removal of right ureter stent, new soft tissue nodule along the course of the right ureter between the right psoas and IVC suspicious for recurrent lymphadenopathy PET 02/12/2021-small focus of  hypermetabolism at the anorectal junction without a definable mass, right retroperitoneal nodal metastasis with obstruction of the proximal right ureter, right middle lobe hypermetabolic nodule, hypermetabolism in  the right hilum without a defined nodal mass, hypermetabolic left supraclavicular and left axillary nodes 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 Cycle 1 Taxol/carboplatin 03/11/2021 Cycle 2 Taxol/carboplatin 03/31/2021, Fulphila Left labial lesions. Question direct extension from the anal cancer versus metastatic disease from anal cancer versus a separate malignant process. 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 Multiple family members with breast cancer. History of hypokalemia-likely secondary to decreased nutritional intake and diarrhea; potassium in normal range 01/16/2017. No longer taking a potassium supplement. Right hydroureteronephrosis on CT 01/28/2020, status post a cystoscopy/pyelogram 01/28/2020 confirming extrinsic compression of the right ureter, status post stent placement Right ureter stent exchange 06/12/2020 Right ureter stent removed 01/13/2021   8.  Admission 01/07/2021 with Klebsiella pneumoniae urosepsis/bacteremia      Disposition: Ashley Moore appears stable.  She has completed 1 cycle of Taxol/carboplatin.  She had constipation and bone pain.  She will go ahead and begin a laxative regimen.  We discussed Tylenol and/or ibuprofen for the bone pain.  She developed significant neutropenia following cycle 1.  She will receive white cell growth factor support with this cycle.  We reviewed potential toxicities including bone pain, rash, splenic rupture.  She agrees to proceed.  Plan to proceed with cycle 2 Taxol/carboplatin today as scheduled.  We reviewed the CBC and chemistry panel from today.  Labs adequate  to proceed with treatment.  She will return for a follow-up CBC 10 to 12 days.  She will return for lab, follow-up, cycle 3 Taxol/carboplatin in 3 weeks.  We are available to see her sooner if needed.  Patient seen with Dr. Benay Spice.    Ned Card ANP/GNP-BC   03/31/2021  9:01 AM This was a shared visit with Ned Card.  Ashley Moore developed neutropenia following cycle 1 Taxol/carboplatin.  She also had bone pain.  The bone pain was likely related to Taxol.  She will receive G-CSF with this cycle of chemotherapy.  I was present for greater than 50% of today's visit.  I performed medical decision making.  Julieanne Manson, MD

## 2021-04-01 ENCOUNTER — Other Ambulatory Visit: Payer: Self-pay

## 2021-04-01 ENCOUNTER — Inpatient Hospital Stay: Payer: Medicare HMO

## 2021-04-01 VITALS — BP 134/87 | HR 109 | Temp 97.5°F | Resp 18

## 2021-04-01 DIAGNOSIS — D701 Agranulocytosis secondary to cancer chemotherapy: Secondary | ICD-10-CM | POA: Diagnosis not present

## 2021-04-01 DIAGNOSIS — C778 Secondary and unspecified malignant neoplasm of lymph nodes of multiple regions: Secondary | ICD-10-CM | POA: Diagnosis not present

## 2021-04-01 DIAGNOSIS — Z5111 Encounter for antineoplastic chemotherapy: Secondary | ICD-10-CM | POA: Diagnosis not present

## 2021-04-01 DIAGNOSIS — C210 Malignant neoplasm of anus, unspecified: Secondary | ICD-10-CM | POA: Diagnosis not present

## 2021-04-01 MED ORDER — PEGFILGRASTIM-JMDB 6 MG/0.6ML ~~LOC~~ SOSY
6.0000 mg | PREFILLED_SYRINGE | Freq: Once | SUBCUTANEOUS | Status: AC
Start: 2021-04-01 — End: 2021-04-01
  Administered 2021-04-01: 6 mg via SUBCUTANEOUS

## 2021-04-01 NOTE — Patient Instructions (Signed)

## 2021-04-02 ENCOUNTER — Telehealth: Payer: Self-pay

## 2021-04-02 ENCOUNTER — Encounter: Payer: Self-pay | Admitting: Gastroenterology

## 2021-04-02 NOTE — Telephone Encounter (Signed)
Called stating noted rash on nose and cheek.  Not raised or itchy. No other issues.  was concerned form shot yesterday.  insturctied ot use cold compress and OTC benadryl.  Call back if worse or noted respiratory distress. verbalized understanding

## 2021-04-06 ENCOUNTER — Emergency Department (HOSPITAL_COMMUNITY)
Admission: EM | Admit: 2021-04-06 | Discharge: 2021-04-06 | Disposition: A | Discharge status: Home / Self Care | Payer: Medicare HMO | Attending: Emergency Medicine | Admitting: Emergency Medicine

## 2021-04-06 ENCOUNTER — Other Ambulatory Visit: Payer: Self-pay

## 2021-04-06 ENCOUNTER — Encounter: Payer: Medicare HMO | Admitting: Rehabilitative and Restorative Service Providers"

## 2021-04-06 ENCOUNTER — Encounter (HOSPITAL_COMMUNITY): Payer: Self-pay | Admitting: Student

## 2021-04-06 ENCOUNTER — Emergency Department (HOSPITAL_COMMUNITY): Payer: Medicare HMO

## 2021-04-06 DIAGNOSIS — Z79899 Other long term (current) drug therapy: Secondary | ICD-10-CM | POA: Insufficient documentation

## 2021-04-06 DIAGNOSIS — Z85048 Personal history of other malignant neoplasm of rectum, rectosigmoid junction, and anus: Secondary | ICD-10-CM | POA: Diagnosis not present

## 2021-04-06 DIAGNOSIS — D696 Thrombocytopenia, unspecified: Secondary | ICD-10-CM | POA: Insufficient documentation

## 2021-04-06 DIAGNOSIS — E039 Hypothyroidism, unspecified: Secondary | ICD-10-CM | POA: Insufficient documentation

## 2021-04-06 DIAGNOSIS — R600 Localized edema: Secondary | ICD-10-CM | POA: Insufficient documentation

## 2021-04-06 DIAGNOSIS — R0789 Other chest pain: Secondary | ICD-10-CM | POA: Diagnosis not present

## 2021-04-06 DIAGNOSIS — R770 Abnormality of albumin: Secondary | ICD-10-CM | POA: Diagnosis not present

## 2021-04-06 DIAGNOSIS — D649 Anemia, unspecified: Secondary | ICD-10-CM | POA: Diagnosis not present

## 2021-04-06 DIAGNOSIS — Z87891 Personal history of nicotine dependence: Secondary | ICD-10-CM | POA: Diagnosis not present

## 2021-04-06 DIAGNOSIS — R079 Chest pain, unspecified: Secondary | ICD-10-CM | POA: Insufficient documentation

## 2021-04-06 LAB — COMPREHENSIVE METABOLIC PANEL
ALT: 14 U/L (ref 0–44)
AST: 24 U/L (ref 15–41)
Albumin: 3.3 g/dL — ABNORMAL LOW (ref 3.5–5.0)
Alkaline Phosphatase: 82 U/L (ref 38–126)
Anion gap: 9 (ref 5–15)
BUN: 15 mg/dL (ref 8–23)
CO2: 26 mmol/L (ref 22–32)
Calcium: 9.1 mg/dL (ref 8.9–10.3)
Chloride: 101 mmol/L (ref 98–111)
Creatinine, Ser: 0.96 mg/dL (ref 0.44–1.00)
GFR, Estimated: >60 mL/min (ref >60)
Glucose, Bld: 94 mg/dL (ref 70–99)
Potassium: 4.3 mmol/L (ref 3.5–5.1)
Sodium: 136 mmol/L (ref 135–145)
Total Bilirubin: 0.4 mg/dL (ref 0.3–1.2)
Total Protein: 6.5 g/dL (ref 6.5–8.1)

## 2021-04-06 LAB — TROPONIN I (HIGH SENSITIVITY)
Troponin I (High Sensitivity): 12 ng/L (ref <18)
Troponin I (High Sensitivity): 10 ng/L (ref <18)

## 2021-04-06 LAB — CBC WITH DIFFERENTIAL/PLATELET
Abs Immature Granulocytes: 0 10*3/uL (ref 0.00–0.07)
Basophils Absolute: 0.1 10*3/uL (ref 0.0–0.1)
Basophils Relative: 1 %
Eosinophils Absolute: 0.3 10*3/uL (ref 0.0–0.5)
Eosinophils Relative: 4 %
HCT: 31.9 % — ABNORMAL LOW (ref 36.0–46.0)
Hemoglobin: 10 g/dL — ABNORMAL LOW (ref 12.0–15.0)
Lymphocytes Relative: 10 %
Lymphs Abs: 0.7 10*3/uL (ref 0.7–4.0)
MCH: 27.3 pg (ref 26.0–34.0)
MCHC: 31.3 g/dL (ref 30.0–36.0)
MCV: 87.2 fL (ref 80.0–100.0)
Monocytes Absolute: 0.4 10*3/uL (ref 0.1–1.0)
Monocytes Relative: 6 %
Neutro Abs: 5.1 10*3/uL (ref 1.7–7.7)
Neutrophils Relative %: 79 %
Platelets: 149 10*3/uL — ABNORMAL LOW (ref 150–400)
RBC: 3.66 MIL/uL — ABNORMAL LOW (ref 3.87–5.11)
RDW: 15.9 % — ABNORMAL HIGH (ref 11.5–15.5)
WBC: 6.5 10*3/uL (ref 4.0–10.5)
nRBC: 0 % (ref 0.0–0.2)
nRBC: 1 /100 WBC — ABNORMAL HIGH

## 2021-04-06 LAB — D-DIMER, QUANTITATIVE: D-Dimer, Quant: 0.5 ug/mL-FEU (ref 0.00–0.50)

## 2021-04-06 NOTE — ED Provider Notes (Signed)
[facility] Provider Note   CSN: [id number] Arrival date & time: 04/06/21  1356    History Chief Complaint  Patient presents with   Chest Pain    Ashley Moore is a 70 y.o. female with a hx of lymphedema, & rectal cancer with retroperitoneal lymph nodes receiving chemotherapy who presents to the ED via EMS with complaints of chest pain that began @ 11AM that is now resolved @ present.  Patient reports that she was doing normal household activities, was not overly exerting herself, and developed central chest pressure that radiated to her throat with associated nausea.  She tried taking Ativan without relief.  Nothing seem to specifically aggravate the chest discomfort.  EMS was called, they gave 4 aspirin which patient thinks relieved her pain and 1 nitroglycerin  subsequent resolution of the pain without return.  Total pain lasted about 1 hour. She has not had a history of similar in the past.  She denies shortness of breath, vomiting, diaphoresis, dizziness, lightheadedness, or syncope.  Patient is on Premarin.  She denies recent long travel, trauma, surgery, or acute change in her lower extremity swelling.  HPI     Past Medical History:  Diagnosis Date   Arthritis    Bowen's disease    excised 1992   Chronic low back pain    Chronic radiation cystitis    DDD (degenerative disc disease), cervical    Family history of breast cancer    Family history of lung cancer    Family history of non-Hodgkin's lymphoma    Family history of ovarian cancer    Headache    Hx of varicella    Hydronephrosis of right kidney    urologist--- dr Tresa Moore,  malignant,  treated with ureter stent   Hypothyroidism    followed by pcp   Lower urinary tract symptoms (LUTS)    01-12-2021  per pt treated with accupuncture at dr Tresa Moore office   Lymphedema of both lower extremities    PICC (peripherally inserted central catheter) in place 01/11/2021   placed at  [facility] in [place], [place] for IV antibiotics   Positive blood culture 01/08/2021   Klebsiella Pneumaniae,  treated with daily IV antibiotic   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[facility]) oncologist--- dr Benay Spice   dx 02/ 2018,  invasive SCC , completed chemo/ radiation 12-23-2016;  recurrent metastatsis retroperitoneal lymphdenopathy,  completed radiation 04-13-2020 residual right ureteral uropathy obstruction   Retroperitoneal lymphadenopathy    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   Sepsis due to Klebsiella pneumoniae ([place]) 01/07/2021   pt admitted to [facility] in [place],  dx sepsis secondary to acute pyelonephritis with bacteremia , positive blood culture,  discharged 01-11-2021 home daily IV antibiotic   Wears glasses     Patient Active Problem List   Diagnosis Date Noted   Goals of care, counseling/discussion 03/01/2021   SIRS (systemic inflammatory response syndrome) ([place]) 01/13/2021   Pyelonephritis 01/13/2021   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   Genetic testing 01/27/2020   Family history of breast cancer    Family history of lung cancer    Family history of ovarian cancer    Family history of non-Hodgkin's lymphoma    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   Port catheter in place 11/21/2016   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   Anal cancer ([place]) 11/03/2016   Cystitis 05/06/2015   Degenerative cervical disc 04/08/2015  Trigger point of neck 03/26/2015   Pain in joint, ankle and foot 04/23/2013   Visit for preventive health examination 02/13/2013   Routine gynecological examination 02/13/2013   Family history of breast cancer in first degree relative 02/13/2013   Rash and nonspecific skin eruption 02/13/2013   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   VITAMIN D DEFICIENCY 06/25/2008   NECK PAIN 06/02/2008   FOOT PAIN 06/02/2008   Osteopenia 06/02/2008    Past Surgical History:  Procedure  Laterality Date   BUNIONECTOMY Right yrs ago   CYSTOSCOPY W/ URETERAL STENT PLACEMENT Right 06/12/2020   Procedure: CYSTOSCOPY WITH RETROGRADE PYELOGRAM/URETERAL STENT EXCHANGE;  Surgeon: Alexis Frock, MD;  Location: [facility];  Service: Urology;  Laterality: Right;   CYSTOSCOPY W/ URETERAL STENT PLACEMENT Right 01/13/2021   Procedure: CYSTOSCOPY WITH RETROGRADE PYELOGRAM/URETERAL STENT REMOVALRIGHT;  Surgeon: Alexis Frock, MD;  Location: [facility];  Service: Urology;  Laterality: Right;  [place], URETEROSCOPY AND STENT PLACEMENT Right 01/28/2020   Procedure: CYSTOSCOPY WITH RETROGRADE PYELOGRAM, URETEROSCOPY AND STENT PLACEMENT;  Surgeon: Alexis Frock, MD;  Location: [facility];  Service: Urology;  Laterality: Right;   IR GENERIC HISTORICAL  11/14/2016   IR US GUIDE VASC ACCESS RIGHT 11/14/2016 [facility]   IR GENERIC HISTORICAL  11/14/2016   IR FLUORO GUIDE CV LINE RIGHT 11/14/2016 [facility]   IR GENERIC HISTORICAL  12/12/2016   IR US GUIDE VASC ACCESS RIGHT 12/12/2016 [name], MD [facility]   IR GENERIC HISTORICAL  12/12/2016   IR FLUORO GUIDE CV LINE RIGHT 12/12/2016 [name], MD [facility]   SKIN CANCER EXCISION  1990   bowens disease     OB History   No obstetric history on file.    Obstetric Comments  Last mammogram: 09/2018, BIRADS 1 Bone density: 07/2018 Last pap: 2018 - negative, HPV +         Family History  Problem Relation Age of Onset   Lung cancer Mother        lung   Breast cancer Mother 22   Ovarian cancer Mother 58   Pancreatitis Father    Breast cancer Sister 57   Breast cancer Maternal Grandmother 55       breast    Breast cancer Maternal Aunt 75       breast   Melanoma Sister    Breast cancer Sister 63   Non-Hodgkin's lymphoma Paternal Grandmother     Social History   Tobacco Use   Smoking status: Former    Packs/day: 1.00    Years: 25.00    Pack years: 25.00     Types: Cigarettes    Quit date: 06/20/1995    Years since quitting: 25.8   Smokeless tobacco: Never  Vaping Use   Vaping Use: Never used  Substance Use Topics   Alcohol use: Not Currently    Alcohol/week: 0.0 standard drinks    Comment: occasional wine   Drug use: Yes    Types: Marijuana    Comment: 01-12-2021  per pt once a week    Home Medications Prior to Admission medications   Medication Sig Start Date End Date Taking? Authorizing Provider  acetaminophen (TYLENOL) 500 MG tablet Take 500 mg by mouth in the morning and at bedtime.    [provider]  calcium gluconate 500 MG tablet Take 1 tablet by mouth 3 (three) times daily. Takes 250 mg    [provider]  Cholecalciferol (VITAMIN  D3 PO) Take 2,000 Units by mouth daily.    [provider]  conjugated estrogens (PREMARIN) vaginal cream Place 1 Applicatorful vaginally daily.    [provider]  dexamethasone (DECADRON) 2 MG tablet Take 5 tablets (10 mg total) by mouth as directed. Take 10 mg at 10 pm night before 1st Taxol treatment and repeat at 6 am day of 1st Taxol treatment 03/04/21   [name], MD  diazepam (VALIUM) 5 MG tablet Take 1 tablet (5 mg total) by mouth 2 (two) times daily. 03/15/21   Isla Pence, MD  diclofenac Sodium (VOLTAREN) 1 % GEL Apply topically 4 (four) times daily. To shoulder 03/04/21   [provider]  levothyroxine (SYNTHROID) 50 MCG tablet TAKE 1 TABLET EVERY DAY 12/17/20   Panosh, Standley Brooking, MD  LORazepam (ATIVAN) 0.5 MG tablet Take 1 tablet (0.5 mg total) by mouth every 12 (twelve) hours as needed for anxiety. 03/31/21   [name], NP  meloxicam (MOBIC) 15 MG tablet Take 15 mg by mouth daily. 03/15/21   [provider]  Misc Natural Products ([place]) Take 1 capsule by mouth daily.    [provider]  Multiple Vitamins-Minerals (MULTIVITAMIN ADULT) CHEW Chew 1 capsule by mouth daily.    [provider]   MYRBETRIQ 50 MG TB24 tablet Take 50 mg by mouth daily. 01/19/21   [provider]  ondansetron (ZOFRAN) 8 MG tablet Take 1 tablet (8 mg total) by mouth every 8 (eight) hours as needed for nausea or vomiting. Start 72 hours after IV chemotherapy treatment 03/04/21   [name], MD  OVER THE COUNTER MEDICATION Place 1 drop into both eyes daily as needed (dry eyes).    [provider]  OVER THE COUNTER MEDICATION Take 3 tablets by mouth daily. [facility] by Dr. Ardeth Perfect    [provider]  oxyCODONE-acetaminophen (PERCOCET/ROXICET) 5-325 MG tablet Take 1 tablet by mouth every 6 (six) hours as needed for severe pain. 03/15/21   Isla Pence, MD  polyethylene glycol (MIRALAX) 17 g packet Take 17 g by mouth daily. 03/15/21   Isla Pence, MD  prochlorperazine (COMPAZINE) 10 MG tablet Take 1 tablet (10 mg total) by mouth every 6 (six) hours as needed for nausea. 03/04/21   [name], MD  traMADol (ULTRAM) 50 MG tablet Take 100 mg by mouth every 6 (six) hours as needed for moderate pain.    [provider]  Turmeric 500 MG CAPS Take 500 mg by mouth daily.    [provider]    Allergies    Benadryl [diphenhydramine], Citrus, Melatonin, Other, Lemon oil, and Penicillins  Review of Systems   Review of Systems  Constitutional:  Negative for chills and fever.  Respiratory:  Negative for cough and shortness of breath.   Cardiovascular:  Positive for chest pain and leg swelling (Chronic without acute change.).  Gastrointestinal:  Negative for abdominal pain.  Neurological:  Negative for dizziness and syncope.  All other systems reviewed and are negative.  Physical Exam Updated Vital Signs BP 129/83   Pulse 94   Temp 98.1 F (36.7 C) (Oral)   Resp 17   SpO2 100%   Physical Exam Vitals and nursing note reviewed.  Constitutional:      General: She is not in acute distress.    Appearance: She is well-developed. She is not toxic-appearing.   HENT:     Head: Normocephalic and atraumatic.  Eyes:  General:        Right eye: No discharge.        Left eye: No discharge.     Conjunctiva/sclera: Conjunctivae normal.  Cardiovascular:     Rate and Rhythm: Normal rate and regular rhythm.     Pulses:          Radial pulses are 2+ on the right side and 2+ on the left side.  Pulmonary:     Effort: Pulmonary effort is normal. No respiratory distress.     Breath sounds: Normal breath sounds. No wheezing, rhonchi or rales.  Abdominal:     General: There is no distension.     Palpations: Abdomen is soft.     Tenderness: There is no abdominal tenderness.  Musculoskeletal:     Cervical back: Neck supple.     Right lower leg: Edema present.     Left lower leg: Edema present.  Skin:    General: Skin is warm and dry.     Findings: No rash.  Neurological:     Mental Status: She is alert.     Comments: Clear speech.   Psychiatric:        Behavior: Behavior normal.    ED Results / Procedures / Treatments   Labs (all labs ordered are listed, but only abnormal results are displayed) Labs Reviewed - No data to display  EKG EKG Interpretation  Date/Time:  Tuesday April 06 2021 14:10:59 EDT Ventricular Rate:  91 PR Interval:  152 QRS Duration: 89 QT Interval:  356 QTC Calculation: 438 R Axis:   31 Text Interpretation: Sinus rhythm RSR' in V1 or V2, probably normal variant Confirmed by Quintella Reichert [id number]) on 04/06/2021 4:11:28 PM  Radiology DG Chest 2 View  Result Date: 04/06/2021 CLINICAL DATA:  Chest pain EXAM: CHEST - 2 VIEW COMPARISON:  01/16/2021, PET CT 02/12/2021 FINDINGS: The heart size and mediastinal contours are within normal limits. Both lungs are clear. The visualized skeletal structures are unremarkable. IMPRESSION: No active cardiopulmonary disease. Electronically Signed   By: Donavan Foil M.D.   On: 04/06/2021 16:30    Procedures Procedures   Medications Ordered in ED Medications - No data to  display  ED Course  I have reviewed the triage vital signs and the nursing notes.  Pertinent labs & imaging results that were available during my care of the patient were reviewed by me and considered in my medical decision making (see chart for details).    MDM Rules/Calculators/A&P                           Patient presents to the emergency department with chest pain. Patient nontoxic appearing, in no apparent distress, vitals without significant abnormality.   DDX: ACS, pulmonary embolism, dissection, pneumothorax, pneumonia, arrhythmia, severe anemia, MSK, GERD, anxiety.   Additional history obtained:  Additional history obtained from chart review & nursing note review.   EKG: Sinus rhythm, no STEMI  Lab Tests:  I Ordered, reviewed, and interpreted labs, which included:  CBC: Mild anemia similar to prior, mild thrombocytopenia BMP: Mild hypoalbuminemia, no significant electrolyte derangement. Troponin: Initial 12  Imaging Studies ordered:  I ordered imaging studies which included CXR, I independently reviewed, formal radiology impression shows: No active cardiopulmonary disease.  ED Course:  Heart Pathway Score 4- EKG without obvious acute ischemia, high sensitivity delta troponin negative, will provide cardiology follow up. D-dimer WNL feel PE less likely, no hypoxia/tachycardia & pain free. Pulses  symmetric, no widened mediastinum on CXR, normotensive- doubt dissection. CXR & Labs overall reassuring. Patient has remained pain free in the ED. She has appeared hemodynamically stable throughout ER visit and appears safe for discharge with close PCP/cardiology follow up. I discussed results, treatment plan, need for PCP follow-up, and return precautions with the patient. Provided opportunity for questions, patient confirmed understanding and is in agreement with plan.   This is a shared visit with supervising physician Dr. Ralene Bathe who has independently evaluated patient & provided  guidance in evaluation/management/disposition, in agreement with care   Portions of this note were generated with Dragon dictation software. Dictation errors may occur despite best attempts at proofreading.    Final Clinical Impression(s) / ED Diagnoses Final diagnoses:  Chest pain, unspecified type    Rx / DC Orders ED Discharge Orders     None        Leafy Kindle 04/06/21 2004    Quintella Reichert, MD 04/07/21 [phone]

## 2021-04-06 NOTE — Discharge Instructions (Addendum)
You were seen in the emergency department today for chest pain. Your work-up in the emergency department has been overall reassuring. Your labs have been fairly normal and or similar to previous blood work you have had done-your platelets were mildly low, please have these rechecked by your oncologist.. Your EKG and the enzyme we use to check your heart did not show an acute heart attack at this time. Your chest x-ray was normal.   We would like you to follow up closely with your primary care provider and/or the cardiologist provided in your discharge instructions within 1-3 days. Return to the ER immediately should you experience any new or worsening symptoms including but not limited to return of pain, worsened pain, vomiting, shortness of breath, dizziness, lightheadedness, passing out, or any other concerns that you may have.

## 2021-04-06 NOTE — ED Triage Notes (Signed)
PT BIB EMS due to chest pain at 1130 today at rest. Pt states it is chest pressure non radiating. Pt has anal cancer and had chemo last Thursday. EMS gave pt 1 nitro and 4 aspirin. Pts states it is more ingestion than chest pain and has never had issues in past. Axox4.

## 2021-04-07 ENCOUNTER — Encounter: Payer: Medicare HMO | Admitting: Dietician

## 2021-04-08 ENCOUNTER — Ambulatory Visit: Payer: Medicare HMO | Admitting: Rehabilitative and Restorative Service Providers"

## 2021-04-08 ENCOUNTER — Encounter: Payer: Self-pay | Admitting: Rehabilitative and Restorative Service Providers"

## 2021-04-08 ENCOUNTER — Other Ambulatory Visit: Payer: Self-pay

## 2021-04-08 DIAGNOSIS — G8929 Other chronic pain: Secondary | ICD-10-CM

## 2021-04-08 DIAGNOSIS — M6281 Muscle weakness (generalized): Secondary | ICD-10-CM

## 2021-04-08 DIAGNOSIS — M25511 Pain in right shoulder: Secondary | ICD-10-CM

## 2021-04-08 DIAGNOSIS — R293 Abnormal posture: Secondary | ICD-10-CM

## 2021-04-08 DIAGNOSIS — R600 Localized edema: Secondary | ICD-10-CM

## 2021-04-09 ENCOUNTER — Telehealth: Payer: Self-pay

## 2021-04-09 ENCOUNTER — Inpatient Hospital Stay: Payer: Medicare HMO

## 2021-04-09 DIAGNOSIS — D701 Agranulocytosis secondary to cancer chemotherapy: Secondary | ICD-10-CM | POA: Diagnosis not present

## 2021-04-09 DIAGNOSIS — C210 Malignant neoplasm of anus, unspecified: Secondary | ICD-10-CM

## 2021-04-09 DIAGNOSIS — C778 Secondary and unspecified malignant neoplasm of lymph nodes of multiple regions: Secondary | ICD-10-CM | POA: Diagnosis not present

## 2021-04-09 DIAGNOSIS — Z5111 Encounter for antineoplastic chemotherapy: Secondary | ICD-10-CM | POA: Diagnosis not present

## 2021-04-09 LAB — CBC WITH DIFFERENTIAL (CANCER CENTER ONLY)
Abs Immature Granulocytes: 0.72 10*3/uL — ABNORMAL HIGH (ref 0.00–0.07)
Basophils Absolute: 0.1 10*3/uL (ref 0.0–0.1)
Basophils Relative: 1 %
Eosinophils Absolute: 0 10*3/uL (ref 0.0–0.5)
Eosinophils Relative: 0 %
HCT: 33.3 % — ABNORMAL LOW (ref 36.0–46.0)
Hemoglobin: 10.4 g/dL — ABNORMAL LOW (ref 12.0–15.0)
Immature Granulocytes: 5 %
Lymphocytes Relative: 5 %
Lymphs Abs: 0.8 10*3/uL (ref 0.7–4.0)
MCH: 27 pg (ref 26.0–34.0)
MCHC: 31.2 g/dL (ref 30.0–36.0)
MCV: 86.5 fL (ref 80.0–100.0)
Monocytes Absolute: 1.2 10*3/uL — ABNORMAL HIGH (ref 0.1–1.0)
Monocytes Relative: 8 %
Neutro Abs: 11.8 10*3/uL — ABNORMAL HIGH (ref 1.7–7.7)
Neutrophils Relative %: 81 %
Platelet Count: 198 10*3/uL (ref 150–400)
RBC: 3.85 MIL/uL — ABNORMAL LOW (ref 3.87–5.11)
RDW: 16.4 % — ABNORMAL HIGH (ref 11.5–15.5)
Smear Review: NORMAL
WBC Count: 14.6 10*3/uL — ABNORMAL HIGH (ref 4.0–10.5)
nRBC: 0 % (ref 0.0–0.2)

## 2021-04-09 NOTE — Telephone Encounter (Signed)
TC from Pt stating she would like to talk to Dr. Benay Spice about taking the gcsf after her treatment. Pt stated she was in so much pain that she had to go to the ER after previous injection. Discussed with Dr Benay Spice he stated he will discuss during her next visit about eliminating next injection. Relayed this to Pt. She verbalized understanding.

## 2021-04-09 NOTE — Therapy (Signed)
[facility] Physical Therapy [address] [place], [place], [address] Phone: [phone]   Fax:  [phone]  Physical Therapy  No Treatment(Pt. Left without treatment)  Patient Details  Name: Ashley Moore MRN: [id number] Date of Birth: 1951/01/31 Referring Provider (PT): [name] Persons PA-C   Encounter Date: 04/08/2021   PT End of Session - 04/08/21 1607     Visit Number 2    Number of Visits 12    Date for PT Re-Evaluation 05/10/21   extended for HUMANA timeframe approval   Authorization Type HUMANA 12 visits until 05/10/2021    Authorization Time Period 05/10/2021    Authorization - Visit Number 2    Authorization - Number of Visits 12    Progress Note Due on Visit 10    PT Start Time [id number]    PT Stop Time [id number]    PT Time Calculation (min) 10 min    Activity Tolerance Other (comment)   limited by side effects of cancer treatment   Behavior During Therapy [facility] for tasks assessed/performed             Past Medical History:  Diagnosis Date   Arthritis    Bowen's disease    excised 1992   Chronic low back pain    Chronic radiation cystitis    DDD (degenerative disc disease), cervical    Family history of breast cancer    Family history of lung cancer    Family history of non-Hodgkin's lymphoma    Family history of ovarian cancer    Headache    Hx of varicella    Hydronephrosis of right kidney    urologist--- dr Tresa Moore,  malignant,  treated with ureter stent   Hypothyroidism    followed by pcp   Lower urinary tract symptoms (LUTS)    01-12-2021  per pt treated with accupuncture at dr Tresa Moore office   Lymphedema of both lower extremities    PICC (peripherally inserted central catheter) in place 01/11/2021   placed at [facility] in [place], [place] for IV antibiotics   Positive blood culture 01/08/2021   Klebsiella Pneumaniae,  treated with daily IV antibiotic   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[facility]) oncologist--- dr Benay Spice   dx 02/ 2018,   invasive SCC , completed chemo/ radiation 12-23-2016;  recurrent metastatsis retroperitoneal lymphdenopathy,  completed radiation 04-13-2020 residual right ureteral uropathy obstruction   Retroperitoneal lymphadenopathy    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   Sepsis due to Klebsiella pneumoniae ([place]) 01/07/2021   pt admitted to [facility] in [place],  dx sepsis secondary to acute pyelonephritis with bacteremia , positive blood culture,  discharged 01-11-2021 home daily IV antibiotic   Wears glasses     Past Surgical History:  Procedure Laterality Date   BUNIONECTOMY Right yrs ago   [place] Right 06/12/2020   Procedure: CYSTOSCOPY WITH RETROGRADE PYELOGRAM/URETERAL STENT EXCHANGE;  Surgeon: Alexis Frock, MD;  Location: [facility];  Service: Urology;  Laterality: Right;   CYSTOSCOPY W/ URETERAL STENT PLACEMENT Right 01/13/2021   Procedure: CYSTOSCOPY WITH RETROGRADE PYELOGRAM/URETERAL STENT REMOVALRIGHT;  Surgeon: Alexis Frock, MD;  Location: [facility];  Service: Urology;  Laterality: Right;  [place], URETEROSCOPY AND STENT PLACEMENT Right 01/28/2020   Procedure: CYSTOSCOPY WITH RETROGRADE PYELOGRAM, URETEROSCOPY AND STENT PLACEMENT;  Surgeon: Alexis Frock, MD;  [facility];  Service: Urology;  Laterality: Right;   IR  GENERIC HISTORICAL  11/14/2016   IR US GUIDE VASC ACCESS RIGHT 11/14/2016 [facility]   IR GENERIC HISTORICAL  11/14/2016   IR FLUORO GUIDE CV LINE RIGHT 11/14/2016 [facility]   IR GENERIC HISTORICAL  12/12/2016   IR US GUIDE VASC ACCESS RIGHT 12/12/2016 [name], MD [facility]   IR GENERIC HISTORICAL  12/12/2016   IR FLUORO GUIDE CV LINE RIGHT 12/12/2016 [name], MD [facility]   SKIN CANCER EXCISION  1990   bowens disease    There were no vitals filed for this visit.   Subjective Assessment - 04/08/21  1609     Subjective Pt. arrived to clinic today initially stating "I'm not sure if I should be here."  Pt. indicated hospital recently this week  for chest pain.  Pt. indicated today having treatment for her WBC counts and that has caused her a lot of pain everwhere including the Rt shoulder/arm.    Pertinent History Recent dx of metastatic rectal cancer with chemotherapy ongoing  DDD cervical, history of chronic back pain,    Limitations Lifting;House hold activities    Patient Stated Goals Reduce pain, improved Rt arm movements.    Currently in Pain? Yes    Pain Onset More than a month ago                                         PT Short Term Goals - 03/30/21 1603       PT SHORT TERM GOAL #1   Title Patient will demonstrate independent use of home exercise program to maintain progress from in clinic treatments.    Time 3    Period Weeks    Status On-going    Target Date 03/26/21               PT Long Term Goals - 03/05/21 1100       PT LONG TERM GOAL #1   Title Patient will demonstrate/report pain at worst less than or equal to 2/10 to facilitate minimal limitation in daily activity secondary to pain symptoms.    Time 10    Period Weeks    Status New    Target Date 05/14/21      PT LONG TERM GOAL #2   Title Patient will demonstrate independent use of home exercise program to facilitate ability to maintain/progress functional gains from skilled physical therapy services.    Time 10    Period Weeks    Status New    Target Date 05/14/21      PT LONG TERM GOAL #3   Title Patient will demonstrate Rt [place] joint mobility WFL to facilitate usual self care, dressing, reaching overhead at PLOF s limitation due to symptoms.    Time 10    Period Weeks    Status New    Target Date 05/14/21      PT LONG TERM GOAL #4   Title Patient will demonstrate Rt UE MMT 5/5 throughout to facilitate usual lifting, carrying in functional activity to PLOF s  limitation.    Time 10    Period Weeks    Status New    Target Date 05/14/21      PT LONG TERM GOAL #5   Title Pt. will demonstrate FOTO outcome > or = 64% to indicate reduced disability due to condition.    Time 10    Period  Weeks    Status New    Target Date 05/14/21                   Plan - 04/08/21 1615     Clinical Impression Statement Due to symptom presentation, no treatment provided today and will plan to address in future as allowed.  Cancer treatments are taking an impact on her ability at this time.    Personal Factors and Comorbidities Comorbidity 3+    Comorbidities Recent dx of metastatic rectal cancer with chemotherapy ongoing  DDD cervical, history of chronic back pain,    Examination-Activity Limitations Bathing;Sleep;Bed Mobility;Carry;Dressing;Hygiene/Grooming;Lift;Reach Overhead    Examination-Participation Restrictions Cleaning;Community Activity;Shop;Occupation;Meal Prep;Laundry    Stability/Clinical Decision Making Stable/Uncomplicated    Rehab Potential Good    PT Frequency 2x / week    PT Duration Other (comment)   10 weeks (extended for HUMANA approval time)   PT Treatment/Interventions ADLs/Self Care Home Management;Cryotherapy;Moist Heat;Traction;Balance training;Therapeutic exercise;Therapeutic activities;Functional mobility training;Stair training;Gait training;DME Instruction;Ultrasound;Neuromuscular re-education;Patient/family education;Passive range of motion;Spinal Manipulations;Joint Manipulations;Dry needling;Taping;Vasopneumatic Device;Manual techniques    PT Next Visit Plan Treat arm as able.    PT Home Exercise Plan [email or web address]    Consulted and Agree with Plan of Care Patient             Patient will benefit from skilled therapeutic intervention in order to improve the following deficits and impairments:  Decreased endurance, Hypomobility, Pain, Impaired UE functional use, Increased fascial restricitons, Decreased strength, Decreased  activity tolerance, Increased edema, Decreased mobility, Decreased range of motion, Impaired flexibility, Improper body mechanics, Impaired perceived functional ability, Postural dysfunction  Visit Diagnosis: Chronic right shoulder pain  Muscle weakness (generalized)  Abnormal posture  Localized edema     Problem List Patient Active Problem List   Diagnosis Date Noted   Goals of care, counseling/discussion 03/01/2021   SIRS (systemic inflammatory response syndrome) ([place]) 01/13/2021   Pyelonephritis 01/13/2021   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   Genetic testing 01/27/2020   Family history of breast cancer    Family history of lung cancer    Family history of ovarian cancer    Family history of non-Hodgkin's lymphoma    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   Port catheter in place 11/21/2016   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   Anal cancer ([place]) 11/03/2016   Cystitis 05/06/2015   Degenerative cervical disc 04/08/2015   Trigger point of neck 03/26/2015   Pain in joint, ankle and foot 04/23/2013   Visit for preventive health examination 02/13/2013   Routine gynecological examination 02/13/2013   Family history of breast cancer in first degree relative 02/13/2013   Rash and nonspecific skin eruption 02/13/2013   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   VITAMIN D DEFICIENCY 06/25/2008   NECK PAIN 06/02/2008   FOOT PAIN 06/02/2008   Osteopenia 06/02/2008   Scot Jun, PT, DPT, OCS, ATC 04/09/21  7:38 AM    [facility] Physical Therapy [address] [place], [place], [address] Phone: [phone]   Fax:  [phone]  Name: Ashley Moore MRN: [id number] Date of Birth: 1950/10/24

## 2021-04-13 ENCOUNTER — Encounter: Payer: Medicare HMO | Admitting: Rehabilitative and Restorative Service Providers"

## 2021-04-15 ENCOUNTER — Other Ambulatory Visit: Payer: Self-pay

## 2021-04-15 ENCOUNTER — Ambulatory Visit (INDEPENDENT_AMBULATORY_CARE_PROVIDER_SITE_OTHER): Payer: Medicare HMO | Admitting: Rehabilitative and Restorative Service Providers"

## 2021-04-15 ENCOUNTER — Encounter: Payer: Self-pay | Admitting: Rehabilitative and Restorative Service Providers"

## 2021-04-15 DIAGNOSIS — G8929 Other chronic pain: Secondary | ICD-10-CM

## 2021-04-15 DIAGNOSIS — M6281 Muscle weakness (generalized): Secondary | ICD-10-CM

## 2021-04-15 DIAGNOSIS — M25511 Pain in right shoulder: Secondary | ICD-10-CM

## 2021-04-15 DIAGNOSIS — R600 Localized edema: Secondary | ICD-10-CM

## 2021-04-15 DIAGNOSIS — R293 Abnormal posture: Secondary | ICD-10-CM

## 2021-04-15 NOTE — Patient Instructions (Signed)
Access Code: [email or web address] URL: [email or web address] Date: 04/15/2021 Prepared by: Scot Jun  Exercises Supine Shoulder Flexion Extension AAROM with [place] - 2 x daily - 7 x weekly - 3 sets - 10 reps - 5 hold Supine Shoulder Flexion PROM (Mirrored) - 2 x daily - 7 x weekly - 1-2 sets - 10 reps - 5 hold Seated Scapular Retraction - 2 x daily - 7 x weekly - 2 sets - 10 reps - 5 hold Supine Shoulder External Rotation in 45 Degrees Abduction AAROM with [place] (Mirrored) - 2 x daily - 7 x weekly - 1-2 sets - 10 reps - 5 hold Isometric Shoulder Flexion at Wall (Mirrored) - 1 x daily - 7 x weekly - 1 sets - 10 reps - 5 hold Standing Isometric Shoulder External Rotation with Doorway (Mirrored) - 1 x daily - 7 x weekly - 1 sets - 10 reps - 5 hold Standing Isometric Shoulder Internal Rotation at Doorway - 1 x daily - 7 x weekly - 1 sets - 10 reps - 5 hold Standing Isometric Shoulder Abduction with Doorway - Arm Bent - 1 x daily - 7 x weekly - 1 sets - 10 reps - 5 hold

## 2021-04-15 NOTE — Therapy (Addendum)
[facility] Physical Therapy [address] [place], [place], [address] Phone: [phone]   Fax:  [phone]  Physical Therapy Treatment / Discharge  Patient Details  Name: Ashley Moore MRN: [id number] Date of Birth: 12/11/1950 Referring Provider (PT): [name] Persons PA-C   Encounter Date: 04/15/2021   PT End of Session - 04/15/21 1557     Visit Number 3    Number of Visits 12    Date for PT Re-Evaluation 05/10/21   extended for HUMANA timeframe approval   Authorization Type HUMANA 12 visits until 05/10/2021    Authorization Time Period 05/10/2021    Authorization - Visit Number 3    Authorization - Number of Visits 12    Progress Note Due on Visit 10    PT Start Time 9371    PT Stop Time 1632    PT Time Calculation (min) 38 min    Activity Tolerance Patient limited by pain   limited by side effects of cancer treatment   Behavior During Therapy [facility] for tasks assessed/performed             Past Medical History:  Diagnosis Date   Arthritis    Bowen's disease    excised 1992   Chronic low back pain    Chronic radiation cystitis    DDD (degenerative disc disease), cervical    Family history of breast cancer    Family history of lung cancer    Family history of non-Hodgkin's lymphoma    Family history of ovarian cancer    Headache    Hx of varicella    Hydronephrosis of right kidney    urologist--- dr Tresa Moore,  malignant,  treated with ureter stent   Hypothyroidism    followed by pcp   Lower urinary tract symptoms (LUTS)    01-12-2021  per pt treated with accupuncture at dr Tresa Moore office   Lymphedema of both lower extremities    PICC (peripherally inserted central catheter) in place 01/11/2021   placed at [facility] in [place], [place] for IV antibiotics   Positive blood culture 01/08/2021   Klebsiella Pneumaniae,  treated with daily IV antibiotic   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[facility]) oncologist--- dr Benay Spice   dx 02/ 2018,  invasive SCC ,  completed chemo/ radiation 12-23-2016;  recurrent metastatsis retroperitoneal lymphdenopathy,  completed radiation 04-13-2020 residual right ureteral uropathy obstruction   Retroperitoneal lymphadenopathy    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   Sepsis due to Klebsiella pneumoniae ([place]) 01/07/2021   pt admitted to [facility] in [place],  dx sepsis secondary to acute pyelonephritis with bacteremia , positive blood culture,  discharged 01-11-2021 home daily IV antibiotic   Wears glasses     Past Surgical History:  Procedure Laterality Date   BUNIONECTOMY Right yrs ago   [place] Right 06/12/2020   Procedure: CYSTOSCOPY WITH RETROGRADE PYELOGRAM/URETERAL STENT EXCHANGE;  Surgeon: Alexis Frock, MD;  Location: [facility];  Service: Urology;  Laterality: Right;   CYSTOSCOPY W/ URETERAL STENT PLACEMENT Right 01/13/2021   Procedure: CYSTOSCOPY WITH RETROGRADE PYELOGRAM/URETERAL STENT REMOVALRIGHT;  Surgeon: Alexis Frock, MD;  Location: [facility];  Service: Urology;  Laterality: Right;  [place], URETEROSCOPY AND STENT PLACEMENT Right 01/28/2020   Procedure: CYSTOSCOPY WITH RETROGRADE PYELOGRAM, URETEROSCOPY AND STENT PLACEMENT;  Surgeon: Alexis Frock, MD;  [facility];  Service: Urology;  Laterality: Right;   IR GENERIC  HISTORICAL  11/14/2016   IR US GUIDE VASC ACCESS RIGHT 11/14/2016 [facility]   IR GENERIC HISTORICAL  11/14/2016   IR FLUORO GUIDE CV LINE RIGHT 11/14/2016 [facility]   IR GENERIC HISTORICAL  12/12/2016   IR US GUIDE VASC ACCESS RIGHT 12/12/2016 [name], MD [facility]   IR GENERIC HISTORICAL  12/12/2016   IR FLUORO GUIDE CV LINE RIGHT 12/12/2016 [name], MD [facility]   SKIN CANCER EXCISION  1990   bowens disease    There were no vitals filed for this visit.   Subjective Assessment - 04/15/21 1558      Subjective Pt. indicated "feeling it all the time."  Taking medicine and gel    Pertinent History Recent dx of metastatic rectal cancer with chemotherapy ongoing  DDD cervical, history of chronic back pain,    Limitations Lifting;House hold activities    Patient Stated Goals Reduce pain, improved Rt arm movements.    Currently in Pain? Yes    Pain Score 2     Pain Location Shoulder    Pain Descriptors / Indicators Sore;Aching    Pain Type Chronic pain    Pain Onset More than a month ago    Pain Frequency Constant    Aggravating Factors  constant    Pain Relieving Factors gel, medicine helps some.                [facility] PT Assessment - 04/15/21 0001       Assessment   Medical Diagnosis Rt shoulder/arm pain    Referring Provider (PT) [name] Persons PA-C    Onset Date/Surgical Date 12/18/20    Hand Dominance Left      Observation/Other Assessments   Focus on Therapeutic Outcomes (FOTO)  intake 57%      PROM   Right Shoulder Flexion 140 Degrees   in supine   Right Shoulder ABduction 130 Degrees   in supine   Right Shoulder Internal Rotation 50 Degrees   in supine 45 deg abduction   Right Shoulder External Rotation 45 Degrees   in supine 45 deg abduction                          [facility] Adult PT Treatment/Exercise - 04/15/21 0001       Shoulder Exercises: Isometric Strengthening   Other Isometric Exercises standing flexion, abd, er, ir submax painfree isometric hold 5 sec x 10 each      Manual Therapy   Manual therapy comments Rt shoulder PROM to tolerance, g2-g3 inferior joint mobs in flexion, scaption and abduction, sustained posterior glide c ER mobilization c movement in 45 deg abduction Rt shoulder                    PT Education - 04/15/21 1619     Education Details HEP progression    Person(s) Educated Patient    Methods Explanation;Demonstration;Verbal cues;Handout    Comprehension Returned demonstration;Verbalized understanding               PT Short Term Goals - 04/15/21 1621       PT SHORT TERM GOAL #1   Title Patient will demonstrate independent use of home exercise program to maintain progress from in clinic treatments.    Time 3    Period Weeks    Status Achieved    Target Date 03/26/21  PT Long Term Goals - 04/15/21 1621       PT LONG TERM GOAL #1   Title Patient will demonstrate/report pain at worst less than or equal to 2/10 to facilitate minimal limitation in daily activity secondary to pain symptoms.    Time 10    Period Weeks    Status On-going    Target Date 05/14/21      PT LONG TERM GOAL #2   Title Patient will demonstrate independent use of home exercise program to facilitate ability to maintain/progress functional gains from skilled physical therapy services.    Time 10    Period Weeks    Status On-going    Target Date 05/14/21      PT LONG TERM GOAL #3   Title Patient will demonstrate Rt [place] joint mobility WFL to facilitate usual self care, dressing, reaching overhead at PLOF s limitation due to symptoms.    Time 10    Period Weeks    Status On-going    Target Date 05/14/21      PT LONG TERM GOAL #4   Title Patient will demonstrate Rt UE MMT 5/5 throughout to facilitate usual lifting, carrying in functional activity to PLOF s limitation.    Time 4    Period Weeks    Status Revised    Target Date 05/14/21      PT LONG TERM GOAL #5   Title Pt. will demonstrate FOTO outcome > or = 64% to indicate reduced disability due to condition.    Time 10    Period Weeks    Status On-going    Target Date 05/14/21                   Plan - 04/15/21 1626     Clinical Impression Statement Pt. demonstrated some gains in movement as noted compared to evaluation.  FOTO improved as well compared to evaluation.  Pt. continued to present shoulder mobility deficits c strength deficits that impair daily activity from PLOF.  Concurrent cancer treatments do continue  to plan impact on presentation and ability to participate.    Personal Factors and Comorbidities Comorbidity 3+    Comorbidities Recent dx of metastatic rectal cancer with chemotherapy ongoing  DDD cervical, history of chronic back pain,    Examination-Activity Limitations Bathing;Sleep;Bed Mobility;Carry;Dressing;Hygiene/Grooming;Lift;Reach Overhead    Examination-Participation Restrictions Cleaning;Community Activity;Shop;Occupation;Meal Prep;Laundry    Stability/Clinical Decision Making Stable/Uncomplicated    Rehab Potential Good    PT Frequency 2x / week    PT Duration Other (comment)   10 weeks (extended for HUMANA approval time)   PT Treatment/Interventions ADLs/Self Care Home Management;Cryotherapy;Moist Heat;Traction;Balance training;Therapeutic exercise;Therapeutic activities;Functional mobility training;Stair training;Gait training;DME Instruction;Ultrasound;Neuromuscular re-education;Patient/family education;Passive range of motion;Spinal Manipulations;Joint Manipulations;Dry needling;Taping;Vasopneumatic Device;Manual techniques    PT Next Visit Plan Mobilization for shoulder mobility gains, AROM?    PT Home Exercise Plan [email or web address]    Consulted and Agree with Plan of Care Patient             Patient will benefit from skilled therapeutic intervention in order to improve the following deficits and impairments:  Decreased endurance, Hypomobility, Pain, Impaired UE functional use, Increased fascial restricitons, Decreased strength, Decreased activity tolerance, Increased edema, Decreased mobility, Decreased range of motion, Impaired flexibility, Improper body mechanics, Impaired perceived functional ability, Postural dysfunction  Visit Diagnosis: Chronic right shoulder pain  Muscle weakness (generalized)  Abnormal posture  Localized edema     Problem List Patient Active Problem List   Diagnosis Date Noted  Goals of care, counseling/discussion 03/01/2021   SIRS (systemic  inflammatory response syndrome) ([place]) 01/13/2021   Pyelonephritis 01/13/2021   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   Genetic testing 01/27/2020   Family history of breast cancer    Family history of lung cancer    Family history of ovarian cancer    Family history of non-Hodgkin's lymphoma    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   Port catheter in place 11/21/2016   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   Anal cancer ([place]) 11/03/2016   Cystitis 05/06/2015   Degenerative cervical disc 04/08/2015   Trigger point of neck 03/26/2015   Pain in joint, ankle and foot 04/23/2013   Visit for preventive health examination 02/13/2013   Routine gynecological examination 02/13/2013   Family history of breast cancer in first degree relative 02/13/2013   Rash and nonspecific skin eruption 02/13/2013   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   VITAMIN D DEFICIENCY 06/25/2008   NECK PAIN 06/02/2008   FOOT PAIN 06/02/2008   Osteopenia 06/02/2008    Scot Jun, PT, DPT, OCS, ATC 04/15/21  4:31 PM  PHYSICAL THERAPY DISCHARGE SUMMARY  Visits from Start of Care: 3  Current functional level related to goals / functional outcomes: See note   Remaining deficits: See note   Education / Equipment: HEP   Patient agrees to discharge. Patient goals were partially met. Patient is being discharged due to a change in medical status.  Scot Jun, PT, DPT, OCS, ATC 05/18/21  11:27 AM     [facility] Physical Therapy [address] [place], [place], [address] Phone: [phone]   Fax:  [phone]  Name: CAYLIE SANDQUIST MRN: [id number] Date of Birth: 1951/09/09

## 2021-04-16 ENCOUNTER — Other Ambulatory Visit: Payer: Self-pay | Admitting: Oncology

## 2021-04-19 ENCOUNTER — Other Ambulatory Visit: Payer: Self-pay

## 2021-04-19 NOTE — Progress Notes (Signed)
Chief Complaint  Patient presents with   Hospitalization Follow-up    HPI: Ashley Moore 70 y.o. come in for FU ed visit for  mid chest squeezing chest pain  she is under rx for metastatic anal cancer . Was given something for  HB in the ambulance that helped the pain and hasnt recurred .  Pain was something she had never had was severe and asked her sister to call the ambulance.  It improved but then recurred.  Has number for cards but hasnt contacted yet  wanted to talk to [place] first .    No hx of heart problems  never had this before and not since  in middle of chemo for metastatic  cancer .  Still owns business and working .  Currently no chest pain shortness of breath anticipates side effects from the chemo including constipation. Hasnt been taking  thyroid med since her hosp for s sepsis in April. ROS: See pertinent positives and negatives per HPI.  Past Medical History:  Diagnosis Date   Arthritis    Bowen's disease    excised 1992   Chronic low back pain    Chronic radiation cystitis    DDD (degenerative disc disease), cervical    Family history of breast cancer    Family history of lung cancer    Family history of non-Hodgkin's lymphoma    Family history of ovarian cancer    Headache    Hx of varicella    Hydronephrosis of right kidney    urologist--- dr Tresa Moore,  malignant,  treated with ureter stent   Hypothyroidism    followed by pcp   Lower urinary tract symptoms (LUTS)    01-12-2021  per pt treated with accupuncture at dr Tresa Moore office   Lymphedema of both lower extremities    PICC (peripherally inserted central catheter) in place 01/11/2021   placed at [facility] in [place], [place] for IV antibiotics   Positive blood culture 01/08/2021   Klebsiella Pneumaniae,  treated with daily IV antibiotic   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[facility]) oncologist--- dr Benay Spice   dx 02/ 2018,  invasive SCC , completed chemo/ radiation 12-23-2016;  recurrent metastatsis retroperitoneal  lymphdenopathy,  completed radiation 04-13-2020 residual right ureteral uropathy obstruction   Retroperitoneal lymphadenopathy    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   Sepsis due to Klebsiella pneumoniae ([place]) 01/07/2021   pt admitted to [facility] in [place],  dx sepsis secondary to acute pyelonephritis with bacteremia , positive blood culture,  discharged 01-11-2021 home daily IV antibiotic   Wears glasses     Family History  Problem Relation Age of Onset   Lung cancer Mother        lung   Breast cancer Mother 73   Ovarian cancer Mother 74   Pancreatitis Father    Breast cancer Sister 65   Breast cancer Maternal Grandmother 82       breast    Breast cancer Maternal Aunt 75       breast   Melanoma Sister    Breast cancer Sister 40   Non-Hodgkin's lymphoma Paternal Grandmother     Social History   Socioeconomic History   Marital status: Widowed    Spouse name: Not on file   Number of children: 0   Years of education: 14   Highest education level: Associate degree: academic program  Occupational History   Occupation: self employed    Comment: full  time gas station owner  Tobacco Use   Smoking status: Former    Packs/day: 1.00    Years: 25.00    Pack years: 25.00    Types: Cigarettes    Quit date: 06/20/1995    Years since quitting: 25.8   Smokeless tobacco: Never  Vaping Use   Vaping Use: Never used  Substance and Sexual Activity   Alcohol use: Not Currently    Alcohol/week: 0.0 standard drinks    Comment: occasional wine   Drug use: Yes    Types: Marijuana    Comment: 01-12-2021  per pt once a week   Sexual activity: Not on file  Other Topics Concern   Not on file  Social History Narrative   H H  of 1      5 pets.   She is a former smoker   Retired Programmer, applications; Conservator, museum/gallery   etoh   Red wine  1 per night.    Tea green tea and earl gray     Moved from [place] to [place] area in 1983-12-13   1 pregnancy   Husband died spring  2016 cv   Sister died 03/15/2015  Bone cancer    3 remaining sisters            Social Determinants of Health   Financial Resource Strain: Low Risk    Difficulty of Paying Living Expenses: Not hard at all  Food Insecurity: No Food Insecurity   Worried About Charity fundraiser in the Last Year: Never true   Arboriculturist in the Last Year: Never true  Transportation Needs: No Transportation Needs   Lack of Transportation (Medical): No   Lack of Transportation (Non-Medical): No  Physical Activity: Sufficiently Active   Days of Exercise per Week: 5 days   Minutes of Exercise per Session: 30 min  Stress: No Stress Concern Present   Feeling of Stress : Not at all  Social Connections: Moderately Isolated   Frequency of Communication with Friends and Family: More than three times a week   Frequency of Social Gatherings with Friends and Family: More than three times a week   Attends Religious Services: 1 to 4 times per year   Active Member of [employer] or Organizations: No   Attends Archivist Meetings: Never   Marital Status: Widowed    Outpatient Medications Prior to Visit  Medication Sig Dispense Refill   acetaminophen (TYLENOL) 500 MG tablet Take 500 mg by mouth in the morning and at bedtime.     calcium gluconate 500 MG tablet Take 1 tablet by mouth 3 (three) times daily. Takes 250 mg     Cholecalciferol (VITAMIN D3 PO) Take 2,000 Units by mouth daily.     conjugated estrogens (PREMARIN) vaginal cream Place 1 Applicatorful vaginally daily.     dexamethasone (DECADRON) 2 MG tablet Take 5 tablets (10 mg total) by mouth as directed. Take 10 mg at 10 pm night before 1st Taxol treatment and repeat at 6 am day of 1st Taxol treatment 10 tablet 0   diclofenac Sodium (VOLTAREN) 1 % GEL Apply topically 4 (four) times daily. To shoulder     levothyroxine (SYNTHROID) 50 MCG tablet TAKE 1 TABLET EVERY DAY 90  tablet 0   LORazepam (ATIVAN) 0.5 MG tablet Take 1 tablet (0.5 mg total) by mouth every 12 (twelve) hours as needed for anxiety. 60 tablet 0   Misc Natural Products (  CYSTEX URINARY HEALTH PO) Take 1 capsule by mouth daily.     Multiple Vitamins-Minerals (MULTIVITAMIN ADULT) CHEW Chew 1 capsule by mouth daily.     MYRBETRIQ 50 MG TB24 tablet Take 50 mg by mouth daily.     ondansetron (ZOFRAN) 8 MG tablet Take 1 tablet (8 mg total) by mouth every 8 (eight) hours as needed for nausea or vomiting. Start 72 hours after IV chemotherapy treatment 30 tablet 1   OVER THE COUNTER MEDICATION Place 1 drop into both eyes daily as needed (dry eyes).     OVER THE COUNTER MEDICATION Take 3 tablets by mouth daily. Total Health by Dr. Ardeth Perfect     oxyCODONE-acetaminophen (PERCOCET/ROXICET) 5-325 MG tablet Take 1 tablet by mouth every 6 (six) hours as needed for severe pain. 10 tablet 0   polyethylene glycol (MIRALAX) 17 g packet Take 17 g by mouth daily. 14 each 0   prochlorperazine (COMPAZINE) 10 MG tablet Take 1 tablet (10 mg total) by mouth every 6 (six) hours as needed for nausea. 60 tablet 1   traMADol (ULTRAM) 50 MG tablet Take 100 mg by mouth every 6 (six) hours as needed for moderate pain.     Turmeric 500 MG CAPS Take 500 mg by mouth daily.     diazepam (VALIUM) 5 MG tablet Take 1 tablet (5 mg total) by mouth 2 (two) times daily. 10 tablet 0   meloxicam (MOBIC) 15 MG tablet TAKE 1 TABLET EVERY DAY (APPOINTMENT IS NEEDED FOR REFILLS) 90 tablet 0   No facility-administered medications prior to visit.     EXAM:  BP 134/80 (BP Location: Left Arm, Patient Position: Sitting, Cuff Size: Normal)   Pulse 81   Temp 97.7 F (36.5 C) (Oral)   Ht '5\' 2"'$  (1.575 m)   Wt 165 lb 9.6 oz (75.1 kg)   SpO2 98%   BMI 30.29 kg/m   Body mass index is 30.29 kg/m.  GENERAL: vitals reviewed and listed above, alert, oriented, appears well hydrated and in no acute distress HEENT: atraumatic, conjunctiva  clear, no  obvious abnormalities on inspection of external nose and ears OP : Mast NECK: no obvious masses on inspection palpation  LUNGS: clear to auscultation bilaterally, no wheezes, rales or rhonchi, good air movement CV: HRRR, no clubbing cyanosis  significant peripheral edema nl cap refill  MS: moves all extremities without noticeable focal  abnormality normal gait. PSYCH: pleasant and cooperative, no obvious depression or anxiety Lab Results  Component Value Date   WBC 14.6 (H) 04/09/2021   HGB 10.4 (L) 04/09/2021   HCT 33.3 (L) 04/09/2021   PLT 198 04/09/2021   GLUCOSE 94 04/06/2021   CHOL 153 01/08/2020   TRIG 66.0 01/08/2020   HDL 76.30 01/08/2020   LDLCALC 63 01/08/2020   ALT 14 04/06/2021   AST 24 04/06/2021   NA 136 04/06/2021   K 4.3 04/06/2021   CL 101 04/06/2021   CREATININE 0.96 04/06/2021   BUN 15 04/06/2021   CO2 26 04/06/2021   TSH 2.84 04/17/2020   INR 1.1 02/26/2021   HGBA1C 5.5 05/11/2015   BP Readings from Last 3 Encounters:  04/20/21 134/80  04/06/21 (!) 142/97  04/01/21 134/87   ED record reviewed troponins negative, plain chest x-ray negative negative D-dimer. Last TSH done with over a year ago. 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  Will make cards consult    for reasons discussed.   -Patient advised to return or notify health care team  if  new concerns arise.  Patient Instructions    I will send a message to Dr Bernette Redbird team to see if they can add on thyroid testing to the lab work.  Will do referral to cards for the reason stated this may have been esophageal spasm but  need opinion from cardiology  .    Standley Brooking. Everlina Gotts M.D.

## 2021-04-20 ENCOUNTER — Other Ambulatory Visit: Payer: Self-pay | Admitting: Physician Assistant

## 2021-04-20 ENCOUNTER — Ambulatory Visit (INDEPENDENT_AMBULATORY_CARE_PROVIDER_SITE_OTHER): Payer: Medicare HMO | Admitting: Internal Medicine

## 2021-04-20 ENCOUNTER — Encounter: Payer: Self-pay | Admitting: Internal Medicine

## 2021-04-20 ENCOUNTER — Other Ambulatory Visit: Payer: Self-pay | Admitting: Oncology

## 2021-04-20 VITALS — BP 134/80 | HR 81 | Temp 97.7°F | Ht 62.0 in | Wt 165.6 lb

## 2021-04-20 DIAGNOSIS — C210 Malignant neoplasm of anus, unspecified: Secondary | ICD-10-CM

## 2021-04-20 DIAGNOSIS — R072 Precordial pain: Secondary | ICD-10-CM | POA: Diagnosis not present

## 2021-04-20 DIAGNOSIS — E079 Disorder of thyroid, unspecified: Secondary | ICD-10-CM | POA: Diagnosis not present

## 2021-04-20 DIAGNOSIS — C772 Secondary and unspecified malignant neoplasm of intra-abdominal lymph nodes: Secondary | ICD-10-CM

## 2021-04-20 DIAGNOSIS — E039 Hypothyroidism, unspecified: Secondary | ICD-10-CM

## 2021-04-20 NOTE — Patient Instructions (Signed)
   I will send a message to Dr Bernette Redbird team to see if they can add on thyroid testing to the lab work.  Will do referral to cards for the reason stated this may have been esophageal spasm but  need opinion from cardiology  .

## 2021-04-21 ENCOUNTER — Inpatient Hospital Stay: Payer: Medicare HMO | Attending: Nurse Practitioner

## 2021-04-21 ENCOUNTER — Inpatient Hospital Stay: Payer: Medicare HMO | Admitting: *Deleted

## 2021-04-21 ENCOUNTER — Inpatient Hospital Stay: Payer: Medicare HMO | Admitting: Oncology

## 2021-04-21 ENCOUNTER — Other Ambulatory Visit: Payer: Self-pay

## 2021-04-21 VITALS — BP 142/81 | HR 85 | Temp 97.8°F | Resp 18 | Ht 62.0 in | Wt 165.0 lb

## 2021-04-21 DIAGNOSIS — Z5189 Encounter for other specified aftercare: Secondary | ICD-10-CM | POA: Insufficient documentation

## 2021-04-21 DIAGNOSIS — E039 Hypothyroidism, unspecified: Secondary | ICD-10-CM

## 2021-04-21 DIAGNOSIS — Z5111 Encounter for antineoplastic chemotherapy: Secondary | ICD-10-CM | POA: Insufficient documentation

## 2021-04-21 DIAGNOSIS — C210 Malignant neoplasm of anus, unspecified: Secondary | ICD-10-CM

## 2021-04-21 DIAGNOSIS — Z79899 Other long term (current) drug therapy: Secondary | ICD-10-CM | POA: Insufficient documentation

## 2021-04-21 DIAGNOSIS — C778 Secondary and unspecified malignant neoplasm of lymph nodes of multiple regions: Secondary | ICD-10-CM | POA: Diagnosis not present

## 2021-04-21 LAB — CMP (CANCER CENTER ONLY)
ALT: 8 U/L (ref 0–44)
AST: 20 U/L (ref 15–41)
Albumin: 4 g/dL (ref 3.5–5.0)
Alkaline Phosphatase: 83 U/L (ref 38–126)
Anion gap: 10 (ref 5–15)
BUN: 19 mg/dL (ref 8–23)
CO2: 22 mmol/L (ref 22–32)
Calcium: 9 mg/dL (ref 8.9–10.3)
Chloride: 104 mmol/L (ref 98–111)
Creatinine: 0.83 mg/dL (ref 0.44–1.00)
GFR, Estimated: >60 mL/min (ref >60)
Glucose, Bld: 69 mg/dL — ABNORMAL LOW (ref 70–99)
Potassium: 3.9 mmol/L (ref 3.5–5.1)
Sodium: 136 mmol/L (ref 135–145)
Total Bilirubin: 0.3 mg/dL (ref 0.3–1.2)
Total Protein: 7.2 g/dL (ref 6.5–8.1)

## 2021-04-21 LAB — CBC WITH DIFFERENTIAL (CANCER CENTER ONLY)
Abs Immature Granulocytes: 0.02 10*3/uL (ref 0.00–0.07)
Basophils Absolute: 0 10*3/uL (ref 0.0–0.1)
Basophils Relative: 1 %
Eosinophils Absolute: 0 10*3/uL (ref 0.0–0.5)
Eosinophils Relative: 1 %
HCT: 31.5 % — ABNORMAL LOW (ref 36.0–46.0)
Hemoglobin: 9.7 g/dL — ABNORMAL LOW (ref 12.0–15.0)
Immature Granulocytes: 0 %
Lymphocytes Relative: 8 %
Lymphs Abs: 0.4 10*3/uL — ABNORMAL LOW (ref 0.7–4.0)
MCH: 27.2 pg (ref 26.0–34.0)
MCHC: 30.8 g/dL (ref 30.0–36.0)
MCV: 88.2 fL (ref 80.0–100.0)
Monocytes Absolute: 0.5 10*3/uL (ref 0.1–1.0)
Monocytes Relative: 11 %
Neutro Abs: 3.9 10*3/uL (ref 1.7–7.7)
Neutrophils Relative %: 79 %
Platelet Count: 253 10*3/uL (ref 150–400)
RBC: 3.57 MIL/uL — ABNORMAL LOW (ref 3.87–5.11)
RDW: 17.7 % — ABNORMAL HIGH (ref 11.5–15.5)
WBC Count: 4.9 10*3/uL (ref 4.0–10.5)
nRBC: 0 % (ref 0.0–0.2)

## 2021-04-21 LAB — T4, FREE: Free T4: 0.94 ng/dL (ref 0.61–1.12)

## 2021-04-21 MED ORDER — FAMOTIDINE 20 MG IN NS 100 ML IVPB
20.0000 mg | Freq: Once | INTRAVENOUS | Status: AC
  Administered 2021-04-21: 20 mg via INTRAVENOUS

## 2021-04-21 MED ORDER — SODIUM CHLORIDE 0.9 % IV SOLN
Freq: Once | INTRAVENOUS | Status: AC
  Filled 2021-04-21: qty 250

## 2021-04-21 MED ORDER — PALONOSETRON HCL INJECTION 0.25 MG/5ML
0.2500 mg | Freq: Once | INTRAVENOUS | Status: AC
  Administered 2021-04-21: 0.25 mg via INTRAVENOUS
  Filled 2021-04-21: qty 5

## 2021-04-21 MED ORDER — SODIUM CHLORIDE 0.9 % IV SOLN
150.0000 mg | Freq: Once | INTRAVENOUS | Status: AC
  Administered 2021-04-21: 150 mg via INTRAVENOUS
  Filled 2021-04-21: qty 5

## 2021-04-21 MED ORDER — TRAMADOL HCL 50 MG PO TABS: 50.0000 mg | ORAL_TABLET | Freq: Four times a day (QID) | ORAL | 0 refills | Status: DC | PRN

## 2021-04-21 MED ORDER — SODIUM CHLORIDE 0.9 % IV SOLN
10.0000 mg | Freq: Once | INTRAVENOUS | Status: AC
  Administered 2021-04-21: 10 mg via INTRAVENOUS
  Filled 2021-04-21: qty 1

## 2021-04-21 MED ORDER — SODIUM CHLORIDE 0.9 % IV SOLN
150.0000 mg/m2 | Freq: Once | INTRAVENOUS | Status: AC
  Administered 2021-04-21: 276 mg via INTRAVENOUS
  Filled 2021-04-21: qty 46

## 2021-04-21 MED ORDER — SODIUM CHLORIDE 0.9 % IV SOLN
353.2000 mg | Freq: Once | INTRAVENOUS | Status: AC
  Administered 2021-04-21: 350 mg via INTRAVENOUS
  Filled 2021-04-21: qty 35

## 2021-04-21 NOTE — Progress Notes (Signed)
[facility] OFFICE PROGRESS NOTE   Diagnosis: Anal cancer  INTERVAL HISTORY:   Ashley Moore completed another cycle of Taxol/carboplatin on 03/31/2021.  She received G-CSF on 04/01/2021.  No symptom or allergic reaction.  No nausea or neuropathy symptoms.  She developed central anterior chest pain lasting for approximately 15 minutes on 04/06/2021.  She was seen in the emergency room on 04/06/2021.  The chest pain was felt to be noncardiac.  She did not have significant pain at other sites.  She no longer has right abdomen or flank pain.  She has chronic pain at the right shoulder and right thigh after falling.  She has noted increased right greater than left lower leg swelling since eating salt yesterday.  The swelling improved partially overnight.  Objective:  Vital signs in last 24 hours:  Blood pressure (!) 142/81, pulse 85, temperature 97.8 F (36.6 C), temperature source Oral, resp. rate 18, height '5\' 2"'$  (1.575 m), weight 165 lb (74.8 kg), SpO2 99 %.    HEENT: No thrush or ulcers Lymphatics: Soft mobile 1-2 cm left lobe scalene/supraclavicular nodes, no axillary nodes Resp: Lungs clear bilaterally Cardio: Regular rate and rhythm GI: No mass, nontender, no hepatosplenomegaly Vascular: The right lower leg is slightly larger than the left side, no erythema or tenderness.  Trace pitting edema at the ankle bilaterally   Lab Results:  Lab Results  Component Value Date   WBC 4.9 04/21/2021   HGB 9.7 (L) 04/21/2021   HCT 31.5 (L) 04/21/2021   MCV 88.2 04/21/2021   PLT 253 04/21/2021   NEUTROABS 3.9 04/21/2021    CMP  Lab Results  Component Value Date   NA 136 04/06/2021   K 4.3 04/06/2021   CL 101 04/06/2021   CO2 26 04/06/2021   GLUCOSE 94 04/06/2021   BUN 15 04/06/2021   CREATININE 0.96 04/06/2021   CALCIUM 9.1 04/06/2021   PROT 6.5 04/06/2021   ALBUMIN 3.3 (L) 04/06/2021   AST 24 04/06/2021   ALT 14 04/06/2021   ALKPHOS 82 04/06/2021   BILITOT 0.4  04/06/2021   GFRNONAA >60 04/06/2021   GFRAA >60 06/17/2020    Lab Results  Component Value Date   CEA1 1.15 03/04/2021   CEA 1.16 03/04/2021     Medications: I have reviewed the patient's current medications.   Assessment/Plan:  Anal cancer CT abdomen/pelvis 10/21/2016-thickening of the anus extending to the junction between the anus and rectum with a large stool ball in the rectum and mild fat stranding posterior to the rectum. Enlarged lymph nodes in the right and left inguinal regions. A few mildly prominent nodes seen posterior to the rectum. 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 PET scan [id number] hypermetabolic anal mass with hypermetabolic metastases to the groin region bilaterally, left pelvic sidewall and presacral space. Initiation of radiation and cycle 1 5-FU/mitomycin C 11/14/2016 Cycle 2 5-FU/mitomycin C 12/12/2016 (5-FU dose reduced due to mucositis, diarrhea, skin breakdown) Radiation completed 12/23/2016 CT abdomen/pelvis 04/14/2017-resolution of anal mass and bilateral inguinal lymphadenopathy. No residual tumor seen. CT abdomen/pelvis 01/28/2020-right hydroureteronephrosis, transition at the level of the mid right ureter, retroperitoneal lymphadenopathy CT renal stone study 01/28/2020-severe right hydronephrosis and proximal right hydroureter, 8 mm area of increased attenuation in the proximal right ureter-stone versus soft tissue mass PET 02/13/2020-hypermetabolic right retroperitoneal nodal metastases in the aortocaval and posterior pericaval chains, nonspecific mild anal wall hypermetabolism, right nephroureteral stent 02/21/2020 anorectal exam per Dr. Ashley Jacobs radiation changes of the skin around the anal  margin.  Posterior midline smooth scarring.  Mildly stenotic.  Stenosis seems better.  No palpable concerning lesions.  Entire anal canal and distal rectum feels smooth and healthy.  Partial anoscopy performed with no lesions  in the anal canal. Xeloda/radiation beginning 03/02/2020, completed 04/13/2020 CT abdomen/pelvis 06/17/2020-resolution of right retroperitoneal lymphadenopathy, no remaining pathologically enlarged lymph nodes, residual mild right hydronephrosis, no evidence of progressive disease, stable anal wall thickening Digital rectal exam by Dr. Quentin Cornwall 08/26/2020-posterior midline smooth scarring.  Mildly stenotic.  Stenosis seems better.  No palpable concerning lesions.  Anal canal feels smooth without palpable concern.  Unable to tolerate anoscopy.  Next visit in 6 months for surveillance. CT abdomen/pelvis 10/28/2020-no evidence of local recurrence or metastatic disease.  Stable mild rectal wall thickening.  New diffuse bladder wall thickening possibly related to prior radiation CT abdomen/pelvis 01/16/2021- recurrent right-sided hydronephrosis and proximal right hydroureter status post removal of right ureter stent, new soft tissue nodule along the course of the right ureter between the right psoas and IVC suspicious for recurrent lymphadenopathy PET 02/12/2021-small focus of hypermetabolism at the anorectal junction without a definable mass, right retroperitoneal nodal metastasis with obstruction of the proximal right ureter, right middle lobe hypermetabolic nodule, hypermetabolism in the right hilum without a defined nodal mass, hypermetabolic left supraclavicular and left axillary nodes 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 Cycle 1 Taxol/carboplatin 03/11/2021 Cycle 2 Taxol/carboplatin 03/31/2021, Fulphila Cycle 3 Taxol/carboplatin 04/21/2021, Fulphila Left labial lesions. Question direct extension from the anal cancer versus metastatic disease from anal cancer versus a separate malignant process. 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 Multiple  family members with breast cancer. History of hypokalemia-likely secondary to decreased nutritional intake and diarrhea; potassium in normal range 01/16/2017. No longer taking a potassium supplement. Right hydroureteronephrosis on CT 01/28/2020, status post a cystoscopy/pyelogram 01/28/2020 confirming extrinsic compression of the right ureter, status post stent placement Right ureter stent exchange 06/12/2020 Right ureter stent removed 01/13/2021   8.  Admission 01/07/2021 with Klebsiella pneumoniae urosepsis/bacteremia     Disposition: Ashley Moore has completed 2 cycles of Taxol/carboplatin.  She received G-CSF support with cycle 2.  She did not develop neutropenia with cycle 2. She had an episode of chest pain approximately 5 days following cycle 2.  The etiology of the pain is unclear.  The pain could have been related to G-CSF or paclitaxel, but she did not have pain in other sites.  The pain may have been related to reflux.  She has mild swelling in the right lower leg today.  I have a low clinical suspicion for deep vein thrombosis.  She will contact us for increased swelling, erythema, or pain.  Ashley Moore will complete cycle 3 Taxol/carboplatin today.  She will undergo restaging CTs after this cycle.  I refilled her prescription for tramadol.  Betsy Coder, MD  04/21/2021  9:26 AM

## 2021-04-21 NOTE — Patient Instructions (Signed)
[place]   Discharge Instructions: Thank you for choosing [facility] to provide your oncology and hematology care.   If you have a lab appointment with the [facility], please go directly to the [facility] and check in at the registration area.   Wear comfortable clothing and clothing appropriate for easy access to any Portacath or PICC line.   We strive to give you quality time with your provider. You may need to reschedule your appointment if you arrive late (15 or more minutes).  Arriving late affects you and other patients whose appointments are after yours.  Also, if you miss three or more appointments without notifying the office, you may be dismissed from the clinic at the provider's discretion.      For prescription refill requests, have your pharmacy contact our office and allow 72 hours for refills to be completed.    Today you received the following chemotherapy and/or immunotherapy agents Paclitaxel (TAXOL) & Carboplatin Paraplatin).      To help prevent nausea and vomiting after your treatment, we encourage you to take your nausea medication as directed.  BELOW ARE SYMPTOMS THAT SHOULD BE REPORTED IMMEDIATELY: *FEVER GREATER THAN 100.4 F (38 C) OR HIGHER *CHILLS OR SWEATING *NAUSEA AND VOMITING THAT IS NOT CONTROLLED WITH YOUR NAUSEA MEDICATION *UNUSUAL SHORTNESS OF BREATH *UNUSUAL BRUISING OR BLEEDING *URINARY PROBLEMS (pain or burning when urinating, or frequent urination) *BOWEL PROBLEMS (unusual diarrhea, constipation, pain near the anus) TENDERNESS IN MOUTH AND THROAT WITH OR WITHOUT PRESENCE OF ULCERS (sore throat, sores in mouth, or a toothache) UNUSUAL RASH, SWELLING OR PAIN  UNUSUAL VAGINAL DISCHARGE OR ITCHING   Items with * indicate a potential emergency and should be followed up as soon as possible or go to the Emergency Department if any problems should occur.  Please show the CHEMOTHERAPY ALERT CARD or  IMMUNOTHERAPY ALERT CARD at check-in to the Emergency Department and triage nurse.  Should you have questions after your visit or need to cancel or reschedule your appointment, please contact [facility]  Dept: [phone]  and follow the prompts.  Office hours are 8:00 a.m. to 4:30 p.m. Monday - Friday. Please note that voicemails left after 4:00 p.m. may not be returned until the following business day.  We are closed weekends and major holidays. You have access to a nurse at all times for urgent questions. Please call the main number to the clinic Dept: [phone] and follow the prompts.   For any non-urgent questions, you may also contact your provider using MyChart. We now offer e-Visits for anyone 42 and older to request care online for non-urgent symptoms. For details visit [email or web address].   Also download the MyChart app! Go to the app store, search "MyChart", open the app, select [facility], and log in with your MyChart username and password.  Due to Covid, a mask is required upon entering the hospital/clinic. If you do not have a mask, one will be given to you upon arrival. For doctor visits, patients may have 1 support person aged 29 or older with them. For treatment visits, patients cannot have anyone with them due to current Covid guidelines and our immunocompromised population.   Paclitaxel injection What is this medication? PACLITAXEL (PAK li TAX el) is a chemotherapy drug. It targets fast dividing cells, like cancer cells, and causes these cells to die. This medicine is used to treat ovarian cancer, breast cancer, lung cancer, Kaposi's sarcoma, andother  cancers. This medicine may be used for other purposes; ask your health care provider orpharmacist if you have questions. COMMON BRAND NAME(S): Onxol, Taxol What should I tell my care team before I take this medication? They need to know if you have any of these conditions: history of irregular  heartbeat liver disease low blood counts, like low white cell, platelet, or red cell counts lung or breathing disease, like asthma tingling of the fingers or toes, or other nerve disorder an unusual or allergic reaction to paclitaxel, alcohol, polyoxyethylated castor oil, other chemotherapy, other medicines, foods, dyes, or preservatives pregnant or trying to get pregnant breast-feeding How should I use this medication? This drug is given as an infusion into a vein. It is administered in a hospitalor clinic by a specially trained health care professional. Talk to your pediatrician regarding the use of this medicine in children.Special care may be needed. Overdosage: If you think you have taken too much of this medicine contact apoison control center or emergency room at once. NOTE: This medicine is only for you. Do not share this medicine with others. What if I miss a dose? It is important not to miss your dose. Call your doctor or health careprofessional if you are unable to keep an appointment. What may interact with this medication? Do not take this medicine with any of the following medications: live virus vaccines This medicine may also interact with the following medications: antiviral medicines for hepatitis, HIV or AIDS certain antibiotics like erythromycin and clarithromycin certain medicines for fungal infections like ketoconazole and itraconazole certain medicines for seizures like carbamazepine, phenobarbital, phenytoin gemfibrozil nefazodone rifampin St. John's wort This list may not describe all possible interactions. Give your health care provider a list of all the medicines, herbs, non-prescription drugs, or dietary supplements you use. Also tell them if you smoke, drink alcohol, or use illegaldrugs. Some items may interact with your medicine. What should I watch for while using this medication? Your condition will be monitored carefully while you are receiving this  medicine. You will need important blood work done while you are taking thismedicine. This medicine can cause serious allergic reactions. To reduce your risk you will need to take other medicine(s) before treatment with this medicine. If you experience allergic reactions like skin rash, itching or hives, swelling of theface, lips, or tongue, tell your doctor or health care professional right away. In some cases, you may be given additional medicines to help with side effects.Follow all directions for their use. This drug may make you feel generally unwell. This is not uncommon, as chemotherapy can affect healthy cells as well as cancer cells. Report any side effects. Continue your course of treatment even though you feel ill unless yourdoctor tells you to stop. Call your doctor or health care professional for advice if you get a fever, chills or sore throat, or other symptoms of a cold or flu. Do not treat yourself. This drug decreases your body's ability to fight infections. Try toavoid being around people who are sick. This medicine may increase your risk to bruise or bleed. Call your doctor orhealth care professional if you notice any unusual bleeding. Be careful brushing and flossing your teeth or using a toothpick because you may get an infection or bleed more easily. If you have any dental work done,tell your dentist you are receiving this medicine. Avoid taking products that contain aspirin, acetaminophen, ibuprofen, naproxen, or ketoprofen unless instructed by your doctor. These medicines may hide afever. Do not become  pregnant while taking this medicine. Women should inform their doctor if they wish to become pregnant or think they might be pregnant. There is a potential for serious side effects to an unborn child. Talk to your health care professional or pharmacist for more information. Do not breast-feed aninfant while taking this medicine. Men are advised not to father a child while receiving  this medicine. This product may contain alcohol. Ask your pharmacist or healthcare provider if this medicine contains alcohol. Be sure to tell all healthcare providers you are taking this medicine. Certain medicines, like metronidazole and disulfiram, can cause an unpleasant reaction when taken with alcohol. The reaction includes flushing, headache, nausea, vomiting, sweating, and increased thirst. Thereaction can last from 30 minutes to several hours. What side effects may I notice from receiving this medication? Side effects that you should report to your doctor or health care professionalas soon as possible: allergic reactions like skin rash, itching or hives, swelling of the face, lips, or tongue breathing problems changes in vision fast, irregular heartbeat high or low blood pressure mouth sores pain, tingling, numbness in the hands or feet signs of decreased platelets or bleeding - bruising, pinpoint red spots on the skin, black, tarry stools, blood in the urine signs of decreased red blood cells - unusually weak or tired, feeling faint or lightheaded, falls signs of infection - fever or chills, cough, sore throat, pain or difficulty passing urine signs and symptoms of liver injury like dark yellow or brown urine; general ill feeling or flu-like symptoms; light-colored stools; loss of appetite; nausea; right upper belly pain; unusually weak or tired; yellowing of the eyes or skin swelling of the ankles, feet, hands unusually slow heartbeat Side effects that usually do not require medical attention (report to yourdoctor or health care professional if they continue or are bothersome): diarrhea hair loss loss of appetite muscle or joint pain nausea, vomiting pain, redness, or irritation at site where injected tiredness This list may not describe all possible side effects. Call your doctor for medical advice about side effects. You may report side effects to FDA at1-800-FDA-1088. Where  should I keep my medication? This drug is given in a hospital or clinic and will not be stored at home. NOTE: This sheet is a summary. It may not cover all possible information. If you have questions about this medicine, talk to your doctor, pharmacist, orhealth care provider.  2022 Elsevier/Gold Standard (2019-08-07 13:37:23)  Carboplatin injection What is this medication? CARBOPLATIN (KAR boe pla tin) is a chemotherapy drug. It targets fast dividing cells, like cancer cells, and causes these cells to die. This medicine is usedto treat ovarian cancer and many other cancers. This medicine may be used for other purposes; ask your health care provider orpharmacist if you have questions. COMMON BRAND NAME(S): Paraplatin What should I tell my care team before I take this medication? They need to know if you have any of these conditions: blood disorders hearing problems kidney disease recent or ongoing radiation therapy an unusual or allergic reaction to carboplatin, cisplatin, other chemotherapy, other medicines, foods, dyes, or preservatives pregnant or trying to get pregnant breast-feeding How should I use this medication? This drug is usually given as an infusion into a vein. It is administered in [facility] or clinic by a specially trained health care professional. Talk to your pediatrician regarding the use of this medicine in children.Special care may be needed. Overdosage: If you think you have taken too much of this medicine contact apoison control  center or emergency room at once. NOTE: This medicine is only for you. Do not share this medicine with others. What if I miss a dose? It is important not to miss a dose. Call your doctor or health careprofessional if you are unable to keep an appointment. What may interact with this medication? medicines for seizures medicines to increase blood counts like filgrastim, pegfilgrastim, sargramostim some antibiotics like amikacin, gentamicin,  neomycin, streptomycin, tobramycin vaccines Talk to your doctor or health care professional before taking any of thesemedicines: acetaminophen aspirin ibuprofen ketoprofen naproxen This list may not describe all possible interactions. Give your health care provider a list of all the medicines, herbs, non-prescription drugs, or dietary supplements you use. Also tell them if you smoke, drink alcohol, or use illegaldrugs. Some items may interact with your medicine. What should I watch for while using this medication? Your condition will be monitored carefully while you are receiving this medicine. You will need important blood work done while you are taking thismedicine. This drug may make you feel generally unwell. This is not uncommon, as chemotherapy can affect healthy cells as well as cancer cells. Report any side effects. Continue your course of treatment even though you feel ill unless yourdoctor tells you to stop. In some cases, you may be given additional medicines to help with side effects.Follow all directions for their use. Call your doctor or health care professional for advice if you get a fever, chills or sore throat, or other symptoms of a cold or flu. Do not treat yourself. This drug decreases your body's ability to fight infections. Try toavoid being around people who are sick. This medicine may increase your risk to bruise or bleed. Call your doctor orhealth care professional if you notice any unusual bleeding. Be careful brushing and flossing your teeth or using a toothpick because you may get an infection or bleed more easily. If you have any dental work done,tell your dentist you are receiving this medicine. Avoid taking products that contain aspirin, acetaminophen, ibuprofen, naproxen, or ketoprofen unless instructed by your doctor. These medicines may hide afever. Do not become pregnant while taking this medicine. Women should inform their doctor if they wish to become pregnant  or think they might be pregnant. There is a potential for serious side effects to an unborn child. Talk to your health care professional or pharmacist for more information. Do not breast-feed aninfant while taking this medicine. What side effects may I notice from receiving this medication? Side effects that you should report to your doctor or health care professionalas soon as possible: allergic reactions like skin rash, itching or hives, swelling of the face, lips, or tongue signs of infection - fever or chills, cough, sore throat, pain or difficulty passing urine signs of decreased platelets or bleeding - bruising, pinpoint red spots on the skin, black, tarry stools, nosebleeds signs of decreased red blood cells - unusually weak or tired, fainting spells, lightheadedness breathing problems changes in hearing changes in vision chest pain high blood pressure low blood counts - This drug may decrease the number of white blood cells, red blood cells and platelets. You may be at increased risk for infections and bleeding. nausea and vomiting pain, swelling, redness or irritation at the injection site pain, tingling, numbness in the hands or feet problems with balance, talking, walking trouble passing urine or change in the amount of urine Side effects that usually do not require medical attention (report to yourdoctor or health care professional if they continue  or are bothersome): hair loss loss of appetite metallic taste in the mouth or changes in taste This list may not describe all possible side effects. Call your doctor for medical advice about side effects. You may report side effects to FDA at1-800-FDA-1088. Where should I keep my medication? This drug is given in a hospital or clinic and will not be stored at home. NOTE: This sheet is a summary. It may not cover all possible information. If you have questions about this medicine, talk to your doctor, pharmacist, orhealth care  provider.  2022 Elsevier/Gold Standard (2007-12-11 14:38:05)

## 2021-04-22 ENCOUNTER — Inpatient Hospital Stay: Payer: Medicare HMO

## 2021-04-22 ENCOUNTER — Telehealth: Payer: Self-pay | Admitting: Internal Medicine

## 2021-04-22 VITALS — BP 158/97 | Temp 98.0°F | Resp 18

## 2021-04-22 DIAGNOSIS — Z5111 Encounter for antineoplastic chemotherapy: Secondary | ICD-10-CM | POA: Diagnosis not present

## 2021-04-22 DIAGNOSIS — Z79899 Other long term (current) drug therapy: Secondary | ICD-10-CM | POA: Diagnosis not present

## 2021-04-22 DIAGNOSIS — C210 Malignant neoplasm of anus, unspecified: Secondary | ICD-10-CM

## 2021-04-22 DIAGNOSIS — C778 Secondary and unspecified malignant neoplasm of lymph nodes of multiple regions: Secondary | ICD-10-CM | POA: Diagnosis not present

## 2021-04-22 DIAGNOSIS — Z5189 Encounter for other specified aftercare: Secondary | ICD-10-CM | POA: Diagnosis not present

## 2021-04-22 LAB — THYROID PANEL WITH TSH
Free Thyroxine Index: 2.1 (ref 1.2–4.9)
T3 Uptake Ratio: 25 % (ref 24–39)
T4, Total: 8.2 ug/dL (ref 4.5–12.0)
TSH: 7.85 u[IU]/mL — ABNORMAL HIGH (ref 0.450–4.500)

## 2021-04-22 MED ORDER — PEGFILGRASTIM-JMDB 6 MG/0.6ML ~~LOC~~ SOSY
6.0000 mg | PREFILLED_SYRINGE | Freq: Once | SUBCUTANEOUS | Status: AC
  Administered 2021-04-22: 6 mg via SUBCUTANEOUS

## 2021-04-22 NOTE — Telephone Encounter (Signed)
Please see result note 

## 2021-04-22 NOTE — Patient Instructions (Signed)

## 2021-04-22 NOTE — Telephone Encounter (Signed)
PT called to return the missed call about results

## 2021-04-22 NOTE — Progress Notes (Signed)
You may benefit from going back on the low-dose thyroid medicine.  Based on lab tests If you agree restart the 50 mcg levothyroxine once a day.( Please refill if needed)  repeat  tsh   lab in 3-4 months

## 2021-04-23 ENCOUNTER — Telehealth: Payer: Self-pay | Admitting: *Deleted

## 2021-04-23 NOTE — Telephone Encounter (Signed)
Patient left VM at 0830 reporting significant pain right side of head, chest area and right arm. Took a Tramadol, Tylenol and Claritin. Had the Fulphila injection yesterday. RN called back and she reports her pain is improved--chest and head no longer hurt, but her right arm still aches and is an ache that feels like it is in her bone. Pain had decreased from 8/10 to 5/10 now. Informed her that bone pain is common side effect of the injection and to continue to take the Tramadol, Tylenol and Claritin as needed to stay comfortable. Suggested staying warm and a warm bath may help. It should subside in couple days. She will be called if NP feels any other intervention is needed. She did decline stronger pain med.

## 2021-04-28 ENCOUNTER — Encounter: Payer: Self-pay | Admitting: Oncology

## 2021-05-03 ENCOUNTER — Telehealth: Payer: Self-pay | Admitting: Pharmacist

## 2021-05-03 NOTE — Progress Notes (Signed)
Patient returned call wants to know if there is anything additional she can be taking for her arthritis an if the Vitamin D 100 mg she is taking is sufficient as her med list states 2000 units  [facility] Pharmacist Assistant [phone]

## 2021-05-03 NOTE — Chronic Care Management (AMB) (Signed)
Chronic Care Management Pharmacy Assistant   Name: Ashley Moore  MRN: [id number] DOB: June 18, 1951  Reason for Encounter: General Adherence Call   Recent office visits:  04-20-2021 [name], MD - Patient presented for Precordial pain and other concerns. Stopped Diazepam and Meloxicam.  Recent consult visits:  04-21-2021 [name], MD (Oncology) - Patient presented for Anal cancer. Changed Tramadol to 50-100 mg Oral every 6 hrs PRN. Stopped Oxycodone-acetaminophen.  03-31-2021 [name], NP (Oncology) - Patient presented for Anal Cancer. No medication changes.   03-11-2021 [name], NP (Oncology) - Patient presented for Anal Cancer. No medication changes.  03-03-2021 Persons, [name], PA (Orthopedics) - Patient presented for right arm pain. No medication changes.  Hospital visits:  Medication Reconciliation was completed by comparing discharge summary, patient's EMR and Pharmacy list, and upon discussion with patient.  Presented to [facility] ED on 04-06-2021  due to Chest pain. Patient was there for 6 hours. New?Medications Started at [facility] Discharge:?? -started   None  Medication Changes at Hospital Discharge: -Changed  None   Medications Discontinued at Hospital Discharge: -Stopped  None  Medications that remain the same after Hospital Discharge:??  -All other medications will remain the same.    Medication Reconciliation was completed by comparing discharge summary, patient's EMR and Pharmacy list, and upon discussion with patient.  Presented to [facility]  ED on 03-15-2021  due to constipation and other concerns. Patient was there for 6 hours. New?Medications Started at [facility] Discharge:?? -started  Diazepam '5mg'$  take one twice daily Oxycodone-acetaminophen 5-325 MG take one every 6 hours PRN Polyethylene glycol 17 g packet take daily  Medication Changes at Hospital Discharge: -Changed  None    Medications Discontinued at Hospital Discharge: -Stopped  None  Medications that remain the same after Hospital Discharge:??  -All other medications will remain the same.    Medications: Outpatient Encounter Medications as of 05/03/2021  Medication Sig Note   acetaminophen (TYLENOL) 500 MG tablet Take 500 mg by mouth in the morning and at bedtime.    calcium gluconate 500 MG tablet Take 1 tablet by mouth 3 (three) times daily. Takes 250 mg    Cholecalciferol (VITAMIN D3 PO) Take 2,000 Units by mouth daily.    conjugated estrogens (PREMARIN) vaginal cream Place 1 Applicatorful vaginally daily.    dexamethasone (DECADRON) 2 MG tablet Take 5 tablets (10 mg total) by mouth as directed. Take 10 mg at 10 pm night before 1st Taxol treatment and repeat at 6 am day of 1st Taxol treatment (Patient not taking: Reported on 04/21/2021)    diclofenac Sodium (VOLTAREN) 1 % GEL Apply topically 4 (four) times daily. To shoulder    levothyroxine (SYNTHROID) 50 MCG tablet TAKE 1 TABLET EVERY DAY (Patient not taking: Reported on 04/21/2021)    LORazepam (ATIVAN) 0.5 MG tablet Take 1 tablet (0.5 mg total) by mouth every 12 (twelve) hours as needed for anxiety.    Misc Natural Products (CYSTEX URINARY HEALTH PO) Take 1 capsule by mouth daily.    Multiple Vitamins-Minerals (MULTIVITAMIN ADULT) CHEW Chew 1 capsule by mouth daily.    MYRBETRIQ 50 MG TB24 tablet Take 50 mg by mouth daily. (Patient not taking: Reported on 04/21/2021)    ondansetron (ZOFRAN) 8 MG tablet Take 1 tablet (8 mg total) by mouth every 8 (eight) hours as needed for nausea or vomiting. Start 72 hours after IV chemotherapy treatment (Patient not taking: Reported on 04/21/2021)  OVER THE COUNTER MEDICATION Place 1 drop into both eyes daily as needed (dry eyes).    OVER THE COUNTER MEDICATION Take 3 tablets by mouth daily. [facility] by Dr. Ardeth Perfect 02/26/2021: "Total restore"   polyethylene glycol (MIRALAX) 17 g packet Take 17 g by mouth daily.     prochlorperazine (COMPAZINE) 10 MG tablet Take 1 tablet (10 mg total) by mouth every 6 (six) hours as needed for nausea.    traMADol (ULTRAM) 50 MG tablet Take 1-2 tablets (50-100 mg total) by mouth every 6 (six) hours as needed for moderate pain.    Turmeric 500 MG CAPS Take 500 mg by mouth daily.    No facility-administered encounter medications on file as of 05/03/2021.  Notes: 05-03-2021 Spoke to pt she advised she is taking the above medications as prescribed. She is taking an OTC of the Vitamin D-3 and is 100 mg daily. She also notes that she has been having some acid reflux and indigestion from her chemo and shots but Rolaids seem to be working well for it. She reports she has also started taking Zazzee 500 mg organic cranberry and Dr Ardeth Perfect total restore 3 times daily. She reports she is good on her fills and Is not in need of any medications at this time she reports no other issues or concerns.  Care Gaps: Zoster Vaccine - Overdue COVID Booster #4 Therapist, music) - Overdue Colonoscopy - Overdue AWV- MSG sent to Ramond Craver CMA to schedule. CCM  /U Call - Scheduled 09-02-2021 2 pm  Star Rating Drugs: None  Ned Clines [facility] Clinical Pharmacist Assistant [phone]

## 2021-05-06 ENCOUNTER — Telehealth: Payer: Self-pay

## 2021-05-06 NOTE — Telephone Encounter (Signed)
TC from Pt stating she would like to take the water based contrast for her scheduled CT scan on 05/11/21 Informed Pt that she would have to arrive 2 hours early to drink the contrast. Pt agreed to be there at 8am to drink contrast appointment note made.

## 2021-05-09 ENCOUNTER — Other Ambulatory Visit: Payer: Self-pay | Admitting: Oncology

## 2021-05-11 ENCOUNTER — Telehealth: Payer: Self-pay | Admitting: Pharmacist

## 2021-05-11 ENCOUNTER — Inpatient Hospital Stay: Payer: Medicare HMO

## 2021-05-11 ENCOUNTER — Other Ambulatory Visit: Payer: Self-pay

## 2021-05-11 ENCOUNTER — Ambulatory Visit (HOSPITAL_BASED_OUTPATIENT_CLINIC_OR_DEPARTMENT_OTHER)
Admission: RE | Admit: 2021-05-11 | Discharge: 2021-05-11 | Disposition: A | Discharge status: Home / Self Care | Payer: Medicare HMO | Source: Ambulatory Visit | Attending: Oncology | Admitting: Oncology

## 2021-05-11 DIAGNOSIS — C210 Malignant neoplasm of anus, unspecified: Secondary | ICD-10-CM | POA: Insufficient documentation

## 2021-05-11 DIAGNOSIS — Z5111 Encounter for antineoplastic chemotherapy: Secondary | ICD-10-CM | POA: Diagnosis not present

## 2021-05-11 DIAGNOSIS — N3289 Other specified disorders of bladder: Secondary | ICD-10-CM | POA: Diagnosis not present

## 2021-05-11 DIAGNOSIS — Z79899 Other long term (current) drug therapy: Secondary | ICD-10-CM | POA: Diagnosis not present

## 2021-05-11 DIAGNOSIS — C778 Secondary and unspecified malignant neoplasm of lymph nodes of multiple regions: Secondary | ICD-10-CM | POA: Diagnosis not present

## 2021-05-11 DIAGNOSIS — R911 Solitary pulmonary nodule: Secondary | ICD-10-CM | POA: Diagnosis not present

## 2021-05-11 DIAGNOSIS — Z5189 Encounter for other specified aftercare: Secondary | ICD-10-CM | POA: Diagnosis not present

## 2021-05-11 LAB — CBC WITH DIFFERENTIAL (CANCER CENTER ONLY)
Abs Immature Granulocytes: 0.02 10*3/uL (ref 0.00–0.07)
Basophils Absolute: 0 10*3/uL (ref 0.0–0.1)
Basophils Relative: 1 %
Eosinophils Absolute: 0 10*3/uL (ref 0.0–0.5)
Eosinophils Relative: 1 %
HCT: 32.4 % — ABNORMAL LOW (ref 36.0–46.0)
Hemoglobin: 10.1 g/dL — ABNORMAL LOW (ref 12.0–15.0)
Immature Granulocytes: 0 %
Lymphocytes Relative: 7 %
Lymphs Abs: 0.4 10*3/uL — ABNORMAL LOW (ref 0.7–4.0)
MCH: 28.1 pg (ref 26.0–34.0)
MCHC: 31.2 g/dL (ref 30.0–36.0)
MCV: 90 fL (ref 80.0–100.0)
Monocytes Absolute: 0.5 10*3/uL (ref 0.1–1.0)
Monocytes Relative: 9 %
Neutro Abs: 4.8 10*3/uL (ref 1.7–7.7)
Neutrophils Relative %: 82 %
Platelet Count: 209 10*3/uL (ref 150–400)
RBC: 3.6 MIL/uL — ABNORMAL LOW (ref 3.87–5.11)
RDW: 18.9 % — ABNORMAL HIGH (ref 11.5–15.5)
WBC Count: 5.8 10*3/uL (ref 4.0–10.5)
nRBC: 0 % (ref 0.0–0.2)

## 2021-05-11 LAB — CMP (CANCER CENTER ONLY)
ALT: 9 U/L (ref 0–44)
AST: 19 U/L (ref 15–41)
Albumin: 3.9 g/dL (ref 3.5–5.0)
Alkaline Phosphatase: 76 U/L (ref 38–126)
Anion gap: 8 (ref 5–15)
BUN: 22 mg/dL (ref 8–23)
CO2: 26 mmol/L (ref 22–32)
Calcium: 9.5 mg/dL (ref 8.9–10.3)
Chloride: 103 mmol/L (ref 98–111)
Creatinine: 0.91 mg/dL (ref 0.44–1.00)
GFR, Estimated: >60 mL/min (ref >60)
Glucose, Bld: 87 mg/dL (ref 70–99)
Potassium: 4.1 mmol/L (ref 3.5–5.1)
Sodium: 137 mmol/L (ref 135–145)
Total Bilirubin: 0.3 mg/dL (ref 0.3–1.2)
Total Protein: 7.4 g/dL (ref 6.5–8.1)

## 2021-05-11 MED ORDER — IOHEXOL 350 MG/ML SOLN
60.0000 mL | Freq: Once | INTRAVENOUS | Status: AC | PRN
  Administered 2021-05-11: 60 mL via INTRAVENOUS

## 2021-05-11 MED ORDER — IOHEXOL 350 MG/ML SOLN: 80.0000 mL | Freq: Once | INTRAVENOUS | Status: DC | PRN

## 2021-05-11 NOTE — Chronic Care Management (AMB) (Signed)
    Chronic Care Management Pharmacy Assistant   Name: Ashley Moore  MRN: [id number] DOB: [birth year]  Reason for Encounter: Call to patient per [name]: Make sure she is taking/using the voltaren gel for the arthritis. If she wants to try something else she could use capsaicin cream which is an OTC as well. She can also take more Tylenol if what she is taking isn't helping. She can safely take up to 6 of the 500 mg tabs of Tylenol or 4 of the arthritis strength (650 mg).    Patient verified she is taking OTC 100 mg of Vitamin D , she noted she has been using the Voltaren gel as well as using the tylenol and arthritis tylenol alternating between the two and does not seem to help. She reports she has not tried the capsaicin cream and will look for OTC .  Medications: Outpatient Encounter Medications as of 05/11/2021  Medication Sig Note   acetaminophen (TYLENOL) 500 MG tablet Take 500 mg by mouth in the morning and at bedtime.    calcium gluconate 500 MG tablet Take 1 tablet by mouth 3 (three) times daily. Takes 250 mg    Cholecalciferol (VITAMIN D3 PO) Take 2,000 Units by mouth daily.    conjugated estrogens (PREMARIN) vaginal cream Place 1 Applicatorful vaginally daily.    dexamethasone (DECADRON) 2 MG tablet Take 5 tablets (10 mg total) by mouth as directed. Take 10 mg at 10 pm night before 1st Taxol treatment and repeat at 6 am day of 1st Taxol treatment (Patient not taking: Reported on 04/21/2021)    diclofenac Sodium (VOLTAREN) 1 % GEL Apply topically 4 (four) times daily. To shoulder    levothyroxine (SYNTHROID) 50 MCG tablet TAKE 1 TABLET EVERY DAY (Patient not taking: Reported on 04/21/2021)    LORazepam (ATIVAN) 0.5 MG tablet Take 1 tablet (0.5 mg total) by mouth every 12 (twelve) hours as needed for anxiety.    Misc Natural Products (CYSTEX URINARY HEALTH PO) Take 1 capsule by mouth daily.    Multiple Vitamins-Minerals (MULTIVITAMIN ADULT) CHEW Chew 1 capsule by mouth daily.     MYRBETRIQ 50 MG TB24 tablet Take 50 mg by mouth daily. (Patient not taking: Reported on 04/21/2021)    ondansetron (ZOFRAN) 8 MG tablet Take 1 tablet (8 mg total) by mouth every 8 (eight) hours as needed for nausea or vomiting. Start 72 hours after IV chemotherapy treatment (Patient not taking: Reported on 04/21/2021)    OVER THE COUNTER MEDICATION Place 1 drop into both eyes daily as needed (dry eyes).    OVER THE COUNTER MEDICATION Take 3 tablets by mouth daily. [facility] by Dr. Ardeth Perfect 02/26/2021: "Total restore"   polyethylene glycol (MIRALAX) 17 g packet Take 17 g by mouth daily.    prochlorperazine (COMPAZINE) 10 MG tablet Take 1 tablet (10 mg total) by mouth every 6 (six) hours as needed for nausea.    traMADol (ULTRAM) 50 MG tablet Take 1-2 tablets (50-100 mg total) by mouth every 6 (six) hours as needed for moderate pain.    Turmeric 500 MG CAPS Take 500 mg by mouth daily.    No facility-administered encounter medications on file as of 05/11/2021.    Care Gaps: Zoster Vaccine - Overdue COVID Booster #4 Therapist, music) - Overdue Colonoscopy - Overdue AWV- MSG sent to Ramond Craver CMA to schedule. CCM  /U Call - Scheduled 09-02-2021 2 pm  Star Rating Drugs: None  Ned Clines Ritchie Clinical Pharmacist Assistant [phone]

## 2021-05-12 ENCOUNTER — Inpatient Hospital Stay: Payer: Medicare HMO | Admitting: Oncology

## 2021-05-12 ENCOUNTER — Inpatient Hospital Stay: Payer: Medicare HMO

## 2021-05-12 VITALS — BP 153/84 | HR 103 | Temp 97.8°F | Resp 18 | Ht 62.0 in | Wt 165.0 lb

## 2021-05-12 DIAGNOSIS — C210 Malignant neoplasm of anus, unspecified: Secondary | ICD-10-CM

## 2021-05-12 DIAGNOSIS — C778 Secondary and unspecified malignant neoplasm of lymph nodes of multiple regions: Secondary | ICD-10-CM | POA: Diagnosis not present

## 2021-05-12 DIAGNOSIS — Z79899 Other long term (current) drug therapy: Secondary | ICD-10-CM | POA: Diagnosis not present

## 2021-05-12 DIAGNOSIS — Z5189 Encounter for other specified aftercare: Secondary | ICD-10-CM | POA: Diagnosis not present

## 2021-05-12 DIAGNOSIS — Z5111 Encounter for antineoplastic chemotherapy: Secondary | ICD-10-CM | POA: Diagnosis not present

## 2021-05-12 MED ORDER — SODIUM CHLORIDE 0.9 % IV SOLN
120.0000 mg/m2 | Freq: Once | INTRAVENOUS | Status: AC
  Administered 2021-05-12: 222 mg via INTRAVENOUS
  Filled 2021-05-12: qty 37

## 2021-05-12 MED ORDER — FAMOTIDINE 20 MG IN NS 100 ML IVPB
20.0000 mg | Freq: Once | INTRAVENOUS | Status: AC
  Administered 2021-05-12: 20 mg via INTRAVENOUS
  Filled 2021-05-12: qty 100

## 2021-05-12 MED ORDER — SODIUM CHLORIDE 0.9 % IV SOLN
264.9000 mg | Freq: Once | INTRAVENOUS | Status: AC
  Administered 2021-05-12: 260 mg via INTRAVENOUS
  Filled 2021-05-12: qty 26

## 2021-05-12 MED ORDER — DEXAMETHASONE SODIUM PHOSPHATE 100 MG/10ML IJ SOLN
10.0000 mg | Freq: Once | INTRAMUSCULAR | Status: AC
  Administered 2021-05-12: 10 mg via INTRAVENOUS
  Filled 2021-05-12: qty 1

## 2021-05-12 MED ORDER — SODIUM CHLORIDE 0.9 % IV SOLN: Freq: Once | INTRAVENOUS | Status: AC

## 2021-05-12 MED ORDER — PALONOSETRON HCL INJECTION 0.25 MG/5ML
0.2500 mg | Freq: Once | INTRAVENOUS | Status: AC
  Administered 2021-05-12: 0.25 mg via INTRAVENOUS
  Filled 2021-05-12: qty 5

## 2021-05-12 MED ORDER — SODIUM CHLORIDE 0.9 % IV SOLN
150.0000 mg | Freq: Once | INTRAVENOUS | Status: AC
  Administered 2021-05-12: 150 mg via INTRAVENOUS
  Filled 2021-05-12: qty 5

## 2021-05-12 NOTE — Progress Notes (Signed)
[facility] OFFICE PROGRESS NOTE   Diagnosis: Anal cancer  INTERVAL HISTORY:   Ashley Moore completed another cycle of Taxol/carboplatin on 04/21/2021.  She had bone pain following Neulasta treatment.  The bone pain lasted for 2 weeks.  The pain was severe and unrelieved with tramadol.  Oxycodone helped.  No neuropathy symptoms.  No sign of allergic reaction.  Good appetite.  She reports an episode of hematuria this week.  Objective:  Vital signs in last 24 hours:  Blood pressure (!) 153/84, pulse (!) 103, temperature 97.8 F (36.6 C), temperature source Oral, resp. rate 18, height '5\' 2"'$  (1.575 m), weight 165 lb (74.8 kg), SpO2 100 %.    HEENT: No thrush or ulcers Lymphatics: Soft mobile 1.5 cm left scalene/supraclavicular node, no other cervical, axillary, or inguinal nodes Resp: Lungs clear bilaterally Cardio: Regular rate and rhythm GI: No hepatosplenomegaly, no mass, nontender Vascular: No leg edema   Lab Results:  Lab Results  Component Value Date   WBC 5.8 05/11/2021   HGB 10.1 (L) 05/11/2021   HCT 32.4 (L) 05/11/2021   MCV 90.0 05/11/2021   PLT 209 05/11/2021   NEUTROABS 4.8 05/11/2021    CMP  Lab Results  Component Value Date   NA 137 05/11/2021   K 4.1 05/11/2021   CL 103 05/11/2021   CO2 26 05/11/2021   GLUCOSE 87 05/11/2021   BUN 22 05/11/2021   CREATININE 0.91 05/11/2021   CALCIUM 9.5 05/11/2021   PROT 7.4 05/11/2021   ALBUMIN 3.9 05/11/2021   AST 19 05/11/2021   ALT 9 05/11/2021   ALKPHOS 76 05/11/2021   BILITOT 0.3 05/11/2021   GFRNONAA >60 05/11/2021   GFRAA >60 06/17/2020    Lab Results  Component Value Date   CEA1 1.15 03/04/2021   CEA 1.16 03/04/2021    Lab Results  Component Value Date   INR 1.1 02/26/2021   LABPROT 14.1 02/26/2021    Imaging:  CT CHEST ABDOMEN PELVIS W CONTRAST  Result Date: 05/12/2021 CLINICAL DATA:  Anal cancer restaging EXAM: CT CHEST, ABDOMEN, AND PELVIS WITH CONTRAST TECHNIQUE:  Multidetector CT imaging of the chest, abdomen and pelvis was performed following the standard protocol during bolus administration of intravenous contrast. CONTRAST:  25m OMNIPAQUE IOHEXOL 350 MG/ML SOLN, additional oral enteric contrast COMPARISON:  PET-CT, 02/12/2021, CT abdomen pelvis, 01/16/2021 FINDINGS: CT CHEST FINDINGS Cardiovascular: Aortic atherosclerosis. Normal heart size. Left coronary artery calcifications. No pericardial effusion. Mediastinum/Nodes: No enlarged mediastinal or hilar lymph nodes. Interval decrease in size of left axillary and subpectoral lymph nodes, previously FDG avid, a previously seen subpectoral node now almost completely resolved, measuring no larger than 3-4 mm (series 2, image 12), and a left axillary node measuring 1.3 x 0.9 cm, previously 1.5 x 1.1 cm (series 2, image 14). Thyroid gland, trachea, and esophagus demonstrate no significant findings. Lungs/Pleura: FDG avid nodule of the lateral segment right middle lobe is diminished in size, 0.4 cm, previously 0.6 cm (series 4, image 70). Additional subpleural nodule of the anterior right middle lobe is slightly enlarged, measuring 0.7 cm, previously 0.4 cm (series 4, image 99). Mild dependent bibasilar scarring. No pleural effusion or pneumothorax. Musculoskeletal: No chest wall mass or suspicious bone lesions identified. CT ABDOMEN PELVIS FINDINGS Hepatobiliary: No solid liver abnormality is seen. No gallstones, gallbladder wall thickening, or biliary dilatation. Pancreas: Unremarkable. No pancreatic ductal dilatation or surrounding inflammatory changes. Spleen: Normal in size without significant abnormality. Adrenals/Urinary Tract: Adrenal glands are unremarkable. Redemonstrated obstruction of the  proximal right ureter by soft tissue nodule detailed below, with severe right hydronephrosis and cortical atrophy. The left kidney is normal. No calculi or hydronephrosis. Unchanged thickening of the urinary bladder. Stomach/Bowel:  Stomach is within normal limits. Appendix appears normal. Unchanged circumferential thickening of the low rectum and perirectal and presacral fat stranding, likely sequelae of local radiation therapy (series 2, image 96). Vascular/Lymphatic: Aortic atherosclerosis. Unchanged enlarged, matted appearing previously FDG avid right iliac lymph nodes or soft tissue nodule, measuring 3.9 x 2.8 cm (series 2, image 68). Reproductive: No mass or other abnormality. Other: No abdominal wall hernia or abnormality. No abdominopelvic ascites. Musculoskeletal: No acute or significant osseous findings. IMPRESSION: 1. FDG avid nodule of the lateral segment right middle lobe is diminished in size, 0.4 cm, previously 0.6 cm. 2. Additional subpleural nodule of the anterior right middle lobe is slightly enlarged, measuring 0.7 cm, previously 0.4 cm. 3. Interval decrease in size of previously FDG avid left axillary and subpectoral lymph nodes. 4. Unchanged enlarged, matted appearing previously FDG avid right iliac lymph nodes or soft tissue nodule. 5. Overall constellation of findings is most consistent with stable to slightly improved metastatic disease, with the exception of enlarged right middle lobe pulmonary nodule as detailed above. 6. Redemonstrated obstruction of the proximal right ureter by soft tissue nodule detailed above, with severe right hydronephrosis and cortical atrophy. 7. Unchanged circumferential thickening of the low rectum and perirectal and presacral fat stranding, likely sequelae of local radiation therapy. 8. Unchanged thickening of the urinary bladder, which may be related to radiation therapy or nonspecific infectious/inflammatory cystitis. 9. Coronary artery disease. Aortic Atherosclerosis (ICD10-I70.0). Electronically Signed   By: Eddie Candle M.D.   On: 05/12/2021 08:47    Medications: I have reviewed the patient's current medications.   Assessment/Plan:  Anal cancer CT abdomen/pelvis  10/21/2016-thickening of the anus extending to the junction between the anus and rectum with a large stool ball in the rectum and mild fat stranding posterior to the rectum. Enlarged lymph nodes in the right and left inguinal regions. A few mildly prominent nodes seen posterior to the rectum. 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 PET scan [id number] hypermetabolic anal mass with hypermetabolic metastases to the groin region bilaterally, left pelvic sidewall and presacral space. Initiation of radiation and cycle 1 5-FU/mitomycin C 11/14/2016 Cycle 2 5-FU/mitomycin C 12/12/2016 (5-FU dose reduced due to mucositis, diarrhea, skin breakdown) Radiation completed 12/23/2016 CT abdomen/pelvis 04/14/2017-resolution of anal mass and bilateral inguinal lymphadenopathy. No residual tumor seen. CT abdomen/pelvis 01/28/2020-right hydroureteronephrosis, transition at the level of the mid right ureter, retroperitoneal lymphadenopathy CT renal stone study 01/28/2020-severe right hydronephrosis and proximal right hydroureter, 8 mm area of increased attenuation in the proximal right ureter-stone versus soft tissue mass PET 02/13/2020-hypermetabolic right retroperitoneal nodal metastases in the aortocaval and posterior pericaval chains, nonspecific mild anal wall hypermetabolism, right nephroureteral stent 02/21/2020 anorectal exam per Dr. Ashley Jacobs radiation changes of the skin around the anal margin.  Posterior midline smooth scarring.  Mildly stenotic.  Stenosis seems better.  No palpable concerning lesions.  Entire anal canal and distal rectum feels smooth and healthy.  Partial anoscopy performed with no lesions in the anal canal. Xeloda/radiation beginning 03/02/2020, completed 04/13/2020 CT abdomen/pelvis 06/17/2020-resolution of right retroperitoneal lymphadenopathy, no remaining pathologically enlarged lymph nodes, residual mild right hydronephrosis, no evidence of progressive  disease, stable anal wall thickening Digital rectal exam by Dr. Quentin Cornwall 08/26/2020-posterior midline smooth scarring.  Mildly stenotic.  Stenosis seems better.  No palpable concerning lesions.  Anal canal feels smooth without palpable concern.  Unable to tolerate anoscopy.  Next visit in 6 months for surveillance. CT abdomen/pelvis 10/28/2020-no evidence of local recurrence or metastatic disease.  Stable mild rectal wall thickening.  New diffuse bladder wall thickening possibly related to prior radiation CT abdomen/pelvis 01/16/2021- recurrent right-sided hydronephrosis and proximal right hydroureter status post removal of right ureter stent, new soft tissue nodule along the course of the right ureter between the right psoas and IVC suspicious for recurrent lymphadenopathy PET 02/12/2021-small focus of hypermetabolism at the anorectal junction without a definable mass, right retroperitoneal nodal metastasis with obstruction of the proximal right ureter, right middle lobe hypermetabolic nodule, hypermetabolism in the right hilum without a defined nodal mass, hypermetabolic left supraclavicular and left axillary nodes 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 Question direct extension from the anal cancer versus metastatic disease from anal cancer versus a separate malignant process. 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 Multiple family members with breast cancer. History of hypokalemia-likely secondary to decreased nutritional intake and diarrhea; potassium in normal range 01/16/2017. No longer taking a potassium supplement. Right hydroureteronephrosis on CT 01/28/2020, status post a cystoscopy/pyelogram 01/28/2020 confirming extrinsic compression of the right ureter, status post stent placement Right ureter stent exchange 06/12/2020 Right ureter stent removed 01/13/2021   8.  Admission 01/07/2021 with Klebsiella pneumoniae urosepsis/bacteremia       Disposition: Ashley Moore appears stable.  The restaging CTs reveal overall stable to slightly improved disease.  I reviewed the CT images.  I will present her case at the GI tumor conference.  The plan is to continue Taxol/carboplatin.  G-CSF will be discontinued secondary to the prolonged pain following G-CSF administration.  I dose reduce the Taxol and carboplatin today.  Ms. Brinck will return for a nadir CBC in approximately 10 days.  She will return for an office visit and cycle 5 Taxol/carboplatin in 3 weeks.  We will plan for a restaging CT evaluation after 3 more cycles of Taxol/carboplatin.  Betsy Coder, MD  05/12/2021  9:19 AM

## 2021-05-12 NOTE — Patient Instructions (Signed)
[place] [facility]   Discharge Instructions: Thank you for choosing [facility] to provide your oncology and hematology care.   If you have a lab appointment with the [facility], please go directly to the [facility] and check in at the registration area.   Wear comfortable clothing and clothing appropriate for easy access to any Portacath or PICC line.   We strive to give you quality time with your provider. You may need to reschedule your appointment if you arrive late (15 or more minutes).  Arriving late affects you and other patients whose appointments are after yours.  Also, if you miss three or more appointments without notifying the office, you may be dismissed from the clinic at the provider's discretion.      For prescription refill requests, have your pharmacy contact our office and allow 72 hours for refills to be completed.    Today you received the following chemotherapy and/or immunotherapy agents Paclitaxel (TAXOL) & Carboplatin (PARAPLATIN).      To help prevent nausea and vomiting after your treatment, we encourage you to take your nausea medication as directed.  BELOW ARE SYMPTOMS THAT SHOULD BE REPORTED IMMEDIATELY: *FEVER GREATER THAN 100.4 F (38 C) OR HIGHER *CHILLS OR SWEATING *NAUSEA AND VOMITING THAT IS NOT CONTROLLED WITH YOUR NAUSEA MEDICATION *UNUSUAL SHORTNESS OF BREATH *UNUSUAL BRUISING OR BLEEDING *URINARY PROBLEMS (pain or burning when urinating, or frequent urination) *BOWEL PROBLEMS (unusual diarrhea, constipation, pain near the anus) TENDERNESS IN MOUTH AND THROAT WITH OR WITHOUT PRESENCE OF ULCERS (sore throat, sores in mouth, or a toothache) UNUSUAL RASH, SWELLING OR PAIN  UNUSUAL VAGINAL DISCHARGE OR ITCHING   Items with * indicate a potential emergency and should be followed up as soon as possible or go to the Emergency Department if any problems should occur.  Please show the CHEMOTHERAPY ALERT CARD or  IMMUNOTHERAPY ALERT CARD at check-in to the Emergency Department and triage nurse.  Should you have questions after your visit or need to cancel or reschedule your appointment, please contact [facility]  Dept: [phone]  and follow the prompts.  Office hours are 8:00 a.m. to 4:30 p.m. Monday - Friday. Please note that voicemails left after 4:00 p.m. may not be returned until the following business day.  We are closed weekends and major holidays. You have access to a nurse at all times for urgent questions. Please call the main number to the clinic Dept: [phone] and follow the prompts.   For any non-urgent questions, you may also contact your provider using MyChart. We now offer e-Visits for anyone 19 and older to request care online for non-urgent symptoms. For details visit [email or web address].   Also download the MyChart app! Go to the app store, search "MyChart", open the app, select [facility], and log in with your MyChart username and password.  Due to Covid, a mask is required upon entering the hospital/clinic. If you do not have a mask, one will be given to you upon arrival. For doctor visits, patients may have 1 support person aged 33 or older with them. For treatment visits, patients cannot have anyone with them due to current Covid guidelines and our immunocompromised population.   Paclitaxel injection What is this medication? PACLITAXEL (PAK li TAX el) is a chemotherapy drug. It targets fast dividing cells, like cancer cells, and causes these cells to die. This medicine is used to treat ovarian cancer, breast cancer, lung cancer, Kaposi's sarcoma, andother  cancers. This medicine may be used for other purposes; ask your health care provider orpharmacist if you have questions. COMMON BRAND NAME(S): Onxol, Taxol What should I tell my care team before I take this medication? They need to know if you have any of these conditions: history of irregular  heartbeat liver disease low blood counts, like low white cell, platelet, or red cell counts lung or breathing disease, like asthma tingling of the fingers or toes, or other nerve disorder an unusual or allergic reaction to paclitaxel, alcohol, polyoxyethylated castor oil, other chemotherapy, other medicines, foods, dyes, or preservatives pregnant or trying to get pregnant breast-feeding How should I use this medication? This drug is given as an infusion into a vein. It is administered in a hospitalor clinic by a specially trained health care professional. Talk to your pediatrician regarding the use of this medicine in children.Special care may be needed. Overdosage: If you think you have taken too much of this medicine contact apoison control center or emergency room at once. NOTE: This medicine is only for you. Do not share this medicine with others. What if I miss a dose? It is important not to miss your dose. Call your doctor or health careprofessional if you are unable to keep an appointment. What may interact with this medication? Do not take this medicine with any of the following medications: live virus vaccines This medicine may also interact with the following medications: antiviral medicines for hepatitis, HIV or AIDS certain antibiotics like erythromycin and clarithromycin certain medicines for fungal infections like ketoconazole and itraconazole certain medicines for seizures like carbamazepine, phenobarbital, phenytoin gemfibrozil nefazodone rifampin St. John's wort This list may not describe all possible interactions. Give your health care provider a list of all the medicines, herbs, non-prescription drugs, or dietary supplements you use. Also tell them if you smoke, drink alcohol, or use illegaldrugs. Some items may interact with your medicine. What should I watch for while using this medication? Your condition will be monitored carefully while you are receiving this  medicine. You will need important blood work done while you are taking thismedicine. This medicine can cause serious allergic reactions. To reduce your risk you will need to take other medicine(s) before treatment with this medicine. If you experience allergic reactions like skin rash, itching or hives, swelling of theface, lips, or tongue, tell your doctor or health care professional right away. In some cases, you may be given additional medicines to help with side effects.Follow all directions for their use. This drug may make you feel generally unwell. This is not uncommon, as chemotherapy can affect healthy cells as well as cancer cells. Report any side effects. Continue your course of treatment even though you feel ill unless yourdoctor tells you to stop. Call your doctor or health care professional for advice if you get a fever, chills or sore throat, or other symptoms of a cold or flu. Do not treat yourself. This drug decreases your body's ability to fight infections. Try toavoid being around people who are sick. This medicine may increase your risk to bruise or bleed. Call your doctor orhealth care professional if you notice any unusual bleeding. Be careful brushing and flossing your teeth or using a toothpick because you may get an infection or bleed more easily. If you have any dental work done,tell your dentist you are receiving this medicine. Avoid taking products that contain aspirin, acetaminophen, ibuprofen, naproxen, or ketoprofen unless instructed by your doctor. These medicines may hide afever. Do not become  pregnant while taking this medicine. Women should inform their doctor if they wish to become pregnant or think they might be pregnant. There is a potential for serious side effects to an unborn child. Talk to your health care professional or pharmacist for more information. Do not breast-feed aninfant while taking this medicine. Men are advised not to father a child while receiving  this medicine. This product may contain alcohol. Ask your pharmacist or healthcare provider if this medicine contains alcohol. Be sure to tell all healthcare providers you are taking this medicine. Certain medicines, like metronidazole and disulfiram, can cause an unpleasant reaction when taken with alcohol. The reaction includes flushing, headache, nausea, vomiting, sweating, and increased thirst. Thereaction can last from 30 minutes to several hours. What side effects may I notice from receiving this medication? Side effects that you should report to your doctor or health care professionalas soon as possible: allergic reactions like skin rash, itching or hives, swelling of the face, lips, or tongue breathing problems changes in vision fast, irregular heartbeat high or low blood pressure mouth sores pain, tingling, numbness in the hands or feet signs of decreased platelets or bleeding - bruising, pinpoint red spots on the skin, black, tarry stools, blood in the urine signs of decreased red blood cells - unusually weak or tired, feeling faint or lightheaded, falls signs of infection - fever or chills, cough, sore throat, pain or difficulty passing urine signs and symptoms of liver injury like dark yellow or brown urine; general ill feeling or flu-like symptoms; light-colored stools; loss of appetite; nausea; right upper belly pain; unusually weak or tired; yellowing of the eyes or skin swelling of the ankles, feet, hands unusually slow heartbeat Side effects that usually do not require medical attention (report to yourdoctor or health care professional if they continue or are bothersome): diarrhea hair loss loss of appetite muscle or joint pain nausea, vomiting pain, redness, or irritation at site where injected tiredness This list may not describe all possible side effects. Call your doctor for medical advice about side effects. You may report side effects to FDA at1-800-FDA-1088. Where  should I keep my medication? This drug is given in a hospital or clinic and will not be stored at home. NOTE: This sheet is a summary. It may not cover all possible information. If you have questions about this medicine, talk to your doctor, pharmacist, orhealth care provider.  2022 Elsevier/Gold Standard (2019-08-07 13:37:23)  Carboplatin injection What is this medication? CARBOPLATIN (KAR boe pla tin) is a chemotherapy drug. It targets fast dividing cells, like cancer cells, and causes these cells to die. This medicine is usedto treat ovarian cancer and many other cancers. This medicine may be used for other purposes; ask your health care provider orpharmacist if you have questions. COMMON BRAND NAME(S): Paraplatin What should I tell my care team before I take this medication? They need to know if you have any of these conditions: blood disorders hearing problems kidney disease recent or ongoing radiation therapy an unusual or allergic reaction to carboplatin, cisplatin, other chemotherapy, other medicines, foods, dyes, or preservatives pregnant or trying to get pregnant breast-feeding How should I use this medication? This drug is usually given as an infusion into a vein. It is administered in [place] [facility] or clinic by a specially trained health care professional. Talk to your pediatrician regarding the use of this medicine in children.Special care may be needed. Overdosage: If you think you have taken too much of this medicine contact apoison control  center or emergency room at once. NOTE: This medicine is only for you. Do not share this medicine with others. What if I miss a dose? It is important not to miss a dose. Call your doctor or health careprofessional if you are unable to keep an appointment. What may interact with this medication? medicines for seizures medicines to increase blood counts like filgrastim, pegfilgrastim, sargramostim some antibiotics like amikacin, gentamicin,  neomycin, streptomycin, tobramycin vaccines Talk to your doctor or health care professional before taking any of thesemedicines: acetaminophen aspirin ibuprofen ketoprofen naproxen This list may not describe all possible interactions. Give your health care provider a list of all the medicines, herbs, non-prescription drugs, or dietary supplements you use. Also tell them if you smoke, drink alcohol, or use illegaldrugs. Some items may interact with your medicine. What should I watch for while using this medication? Your condition will be monitored carefully while you are receiving this medicine. You will need important blood work done while you are taking thismedicine. This drug may make you feel generally unwell. This is not uncommon, as chemotherapy can affect healthy cells as well as cancer cells. Report any side effects. Continue your course of treatment even though you feel ill unless yourdoctor tells you to stop. In some cases, you may be given additional medicines to help with side effects.Follow all directions for their use. Call your doctor or health care professional for advice if you get a fever, chills or sore throat, or other symptoms of a cold or flu. Do not treat yourself. This drug decreases your body's ability to fight infections. Try toavoid being around people who are sick. This medicine may increase your risk to bruise or bleed. Call your doctor orhealth care professional if you notice any unusual bleeding. Be careful brushing and flossing your teeth or using a toothpick because you may get an infection or bleed more easily. If you have any dental work done,tell your dentist you are receiving this medicine. Avoid taking products that contain aspirin, acetaminophen, ibuprofen, naproxen, or ketoprofen unless instructed by your doctor. These medicines may hide afever. Do not become pregnant while taking this medicine. Women should inform their doctor if they wish to become pregnant  or think they might be pregnant. There is a potential for serious side effects to an unborn child. Talk to your health care professional or pharmacist for more information. Do not breast-feed aninfant while taking this medicine. What side effects may I notice from receiving this medication? Side effects that you should report to your doctor or health care professionalas soon as possible: allergic reactions like skin rash, itching or hives, swelling of the face, lips, or tongue signs of infection - fever or chills, cough, sore throat, pain or difficulty passing urine signs of decreased platelets or bleeding - bruising, pinpoint red spots on the skin, black, tarry stools, nosebleeds signs of decreased red blood cells - unusually weak or tired, fainting spells, lightheadedness breathing problems changes in hearing changes in vision chest pain high blood pressure low blood counts - This drug may decrease the number of white blood cells, red blood cells and platelets. You may be at increased risk for infections and bleeding. nausea and vomiting pain, swelling, redness or irritation at the injection site pain, tingling, numbness in the hands or feet problems with balance, talking, walking trouble passing urine or change in the amount of urine Side effects that usually do not require medical attention (report to yourdoctor or health care professional if they continue  or are bothersome): hair loss loss of appetite metallic taste in the mouth or changes in taste This list may not describe all possible side effects. Call your doctor for medical advice about side effects. You may report side effects to FDA at1-800-FDA-1088. Where should I keep my medication? This drug is given in a hospital or clinic and will not be stored at home. NOTE: This sheet is a summary. It may not cover all possible information. If you have questions about this medicine, talk to your doctor, pharmacist, orhealth care  provider.  2022 Elsevier/Gold Standard (2007-12-11 14:38:05)

## 2021-05-13 ENCOUNTER — Telehealth: Payer: Self-pay | Admitting: Gastroenterology

## 2021-05-13 NOTE — Telephone Encounter (Signed)
Spoke with patient, she states that she was recently diagnosed with metastatic anal cancer. She states that her oncologist, Dr. Benay Spice, does not think she needs another colonoscopy. Patient wanted you to be aware of what is going on, her records are in epic. Thanks

## 2021-05-13 NOTE — Telephone Encounter (Signed)
I am terribly sorry to hear that, thank you for the update.  I recall her diagnosis in February 2018 and I reviewed Dr. Gearldine Shown office note from yesterday.  - HD

## 2021-05-13 NOTE — Telephone Encounter (Signed)
Spoke with patient, she is aware and was thankful for the message from Dr. Loletha Carrow.

## 2021-05-14 ENCOUNTER — Other Ambulatory Visit: Payer: Self-pay | Admitting: Nurse Practitioner

## 2021-05-14 DIAGNOSIS — C210 Malignant neoplasm of anus, unspecified: Secondary | ICD-10-CM

## 2021-05-14 MED ORDER — OXYCODONE-ACETAMINOPHEN 5-325 MG PO TABS: 1.0000 | ORAL_TABLET | Freq: Four times a day (QID) | ORAL | 0 refills | Status: DC | PRN

## 2021-05-15 ENCOUNTER — Emergency Department (HOSPITAL_BASED_OUTPATIENT_CLINIC_OR_DEPARTMENT_OTHER): Payer: Medicare HMO | Admitting: Radiology

## 2021-05-15 ENCOUNTER — Inpatient Hospital Stay (HOSPITAL_BASED_OUTPATIENT_CLINIC_OR_DEPARTMENT_OTHER)
Admission: EM | Admit: 2021-05-15 | Discharge: 2021-05-18 | DRG: 690 | Disposition: A | Discharge status: Home / Self Care | Payer: Medicare HMO | Attending: Internal Medicine | Admitting: Internal Medicine

## 2021-05-15 ENCOUNTER — Encounter (HOSPITAL_BASED_OUTPATIENT_CLINIC_OR_DEPARTMENT_OTHER): Payer: Self-pay

## 2021-05-15 DIAGNOSIS — N3000 Acute cystitis without hematuria: Secondary | ICD-10-CM

## 2021-05-15 DIAGNOSIS — N390 Urinary tract infection, site not specified: Secondary | ICD-10-CM | POA: Diagnosis not present

## 2021-05-15 DIAGNOSIS — Z20822 Contact with and (suspected) exposure to covid-19: Secondary | ICD-10-CM | POA: Diagnosis present

## 2021-05-15 DIAGNOSIS — C772 Secondary and unspecified malignant neoplasm of intra-abdominal lymph nodes: Secondary | ICD-10-CM | POA: Diagnosis present

## 2021-05-15 DIAGNOSIS — K5900 Constipation, unspecified: Secondary | ICD-10-CM | POA: Diagnosis not present

## 2021-05-15 DIAGNOSIS — Z888 Allergy status to other drugs, medicaments and biological substances status: Secondary | ICD-10-CM

## 2021-05-15 DIAGNOSIS — E871 Hypo-osmolality and hyponatremia: Secondary | ICD-10-CM | POA: Diagnosis not present

## 2021-05-15 DIAGNOSIS — R1030 Lower abdominal pain, unspecified: Secondary | ICD-10-CM | POA: Diagnosis not present

## 2021-05-15 DIAGNOSIS — Z79890 Hormone replacement therapy (postmenopausal): Secondary | ICD-10-CM

## 2021-05-15 DIAGNOSIS — N139 Obstructive and reflux uropathy, unspecified: Secondary | ICD-10-CM | POA: Diagnosis not present

## 2021-05-15 DIAGNOSIS — N138 Other obstructive and reflux uropathy: Secondary | ICD-10-CM | POA: Diagnosis not present

## 2021-05-15 DIAGNOSIS — R109 Unspecified abdominal pain: Secondary | ICD-10-CM | POA: Diagnosis present

## 2021-05-15 DIAGNOSIS — N10 Acute tubulo-interstitial nephritis: Secondary | ICD-10-CM

## 2021-05-15 DIAGNOSIS — R1909 Other intra-abdominal and pelvic swelling, mass and lump: Secondary | ICD-10-CM | POA: Diagnosis present

## 2021-05-15 DIAGNOSIS — Z808 Family history of malignant neoplasm of other organs or systems: Secondary | ICD-10-CM

## 2021-05-15 DIAGNOSIS — G8929 Other chronic pain: Secondary | ICD-10-CM | POA: Diagnosis present

## 2021-05-15 DIAGNOSIS — C799 Secondary malignant neoplasm of unspecified site: Secondary | ICD-10-CM

## 2021-05-15 DIAGNOSIS — Z79899 Other long term (current) drug therapy: Secondary | ICD-10-CM | POA: Diagnosis not present

## 2021-05-15 DIAGNOSIS — Z86007 Personal history of in-situ neoplasm of skin: Secondary | ICD-10-CM | POA: Diagnosis not present

## 2021-05-15 DIAGNOSIS — C210 Malignant neoplasm of anus, unspecified: Secondary | ICD-10-CM | POA: Diagnosis present

## 2021-05-15 DIAGNOSIS — Z85828 Personal history of other malignant neoplasm of skin: Secondary | ICD-10-CM | POA: Diagnosis not present

## 2021-05-15 DIAGNOSIS — Z8041 Family history of malignant neoplasm of ovary: Secondary | ICD-10-CM | POA: Diagnosis not present

## 2021-05-15 DIAGNOSIS — D638 Anemia in other chronic diseases classified elsewhere: Secondary | ICD-10-CM | POA: Diagnosis not present

## 2021-05-15 DIAGNOSIS — Z880 Allergy status to penicillin: Secondary | ICD-10-CM

## 2021-05-15 DIAGNOSIS — Z6830 Body mass index (BMI) 30.0-30.9, adult: Secondary | ICD-10-CM

## 2021-05-15 DIAGNOSIS — N1339 Other hydronephrosis: Secondary | ICD-10-CM | POA: Diagnosis not present

## 2021-05-15 DIAGNOSIS — N1330 Unspecified hydronephrosis: Secondary | ICD-10-CM | POA: Diagnosis not present

## 2021-05-15 DIAGNOSIS — Z87891 Personal history of nicotine dependence: Secondary | ICD-10-CM | POA: Diagnosis not present

## 2021-05-15 DIAGNOSIS — Z807 Family history of other malignant neoplasms of lymphoid, hematopoietic and related tissues: Secondary | ICD-10-CM | POA: Diagnosis not present

## 2021-05-15 DIAGNOSIS — N136 Pyonephrosis: Secondary | ICD-10-CM | POA: Diagnosis not present

## 2021-05-15 DIAGNOSIS — Z803 Family history of malignant neoplasm of breast: Secondary | ICD-10-CM

## 2021-05-15 DIAGNOSIS — R112 Nausea with vomiting, unspecified: Secondary | ICD-10-CM | POA: Diagnosis not present

## 2021-05-15 DIAGNOSIS — Z801 Family history of malignant neoplasm of trachea, bronchus and lung: Secondary | ICD-10-CM

## 2021-05-15 DIAGNOSIS — M545 Low back pain, unspecified: Secondary | ICD-10-CM | POA: Diagnosis present

## 2021-05-15 DIAGNOSIS — D696 Thrombocytopenia, unspecified: Secondary | ICD-10-CM | POA: Diagnosis present

## 2021-05-15 DIAGNOSIS — E039 Hypothyroidism, unspecified: Secondary | ICD-10-CM | POA: Diagnosis not present

## 2021-05-15 DIAGNOSIS — N261 Atrophy of kidney (terminal): Secondary | ICD-10-CM | POA: Diagnosis present

## 2021-05-15 DIAGNOSIS — E669 Obesity, unspecified: Secondary | ICD-10-CM | POA: Diagnosis not present

## 2021-05-15 LAB — COMPREHENSIVE METABOLIC PANEL
ALT: 13 U/L (ref 0–44)
AST: 26 U/L (ref 15–41)
Albumin: 3.7 g/dL (ref 3.5–5.0)
Alkaline Phosphatase: 64 U/L (ref 38–126)
Anion gap: 12 (ref 5–15)
BUN: 18 mg/dL (ref 8–23)
CO2: 21 mmol/L — ABNORMAL LOW (ref 22–32)
Calcium: 9.6 mg/dL (ref 8.9–10.3)
Chloride: 101 mmol/L (ref 98–111)
Creatinine, Ser: 0.73 mg/dL (ref 0.44–1.00)
GFR, Estimated: >60 mL/min (ref >60)
Glucose, Bld: 101 mg/dL — ABNORMAL HIGH (ref 70–99)
Potassium: 3.7 mmol/L (ref 3.5–5.1)
Sodium: 134 mmol/L — ABNORMAL LOW (ref 135–145)
Total Bilirubin: 0.6 mg/dL (ref 0.3–1.2)
Total Protein: 7.2 g/dL (ref 6.5–8.1)

## 2021-05-15 LAB — CBC WITH DIFFERENTIAL/PLATELET
Abs Immature Granulocytes: 0.02 10*3/uL (ref 0.00–0.07)
Basophils Absolute: 0 10*3/uL (ref 0.0–0.1)
Basophils Relative: 0 %
Eosinophils Absolute: 0 10*3/uL (ref 0.0–0.5)
Eosinophils Relative: 0 %
HCT: 32.6 % — ABNORMAL LOW (ref 36.0–46.0)
Hemoglobin: 10.6 g/dL — ABNORMAL LOW (ref 12.0–15.0)
Immature Granulocytes: 0 %
Lymphocytes Relative: 3 %
Lymphs Abs: 0.2 10*3/uL — ABNORMAL LOW (ref 0.7–4.0)
MCH: 28.3 pg (ref 26.0–34.0)
MCHC: 32.5 g/dL (ref 30.0–36.0)
MCV: 86.9 fL (ref 80.0–100.0)
Monocytes Absolute: 0.1 10*3/uL (ref 0.1–1.0)
Monocytes Relative: 2 %
Neutro Abs: 8.3 10*3/uL — ABNORMAL HIGH (ref 1.7–7.7)
Neutrophils Relative %: 95 %
Platelets: 173 10*3/uL (ref 150–400)
RBC: 3.75 MIL/uL — ABNORMAL LOW (ref 3.87–5.11)
RDW: 18.6 % — ABNORMAL HIGH (ref 11.5–15.5)
WBC: 8.7 10*3/uL (ref 4.0–10.5)
nRBC: 0 % (ref 0.0–0.2)

## 2021-05-15 MED ORDER — IOHEXOL 9 MG/ML PO SOLN
500.0000 mL | Freq: Two times a day (BID) | ORAL | Status: DC | PRN
  Administered 2021-05-15: 500 mL via ORAL

## 2021-05-15 MED ORDER — HYDROMORPHONE HCL 1 MG/ML IJ SOLN
1.0000 mg | Freq: Once | INTRAMUSCULAR | Status: AC
  Administered 2021-05-15: 1 mg via INTRAVENOUS
  Filled 2021-05-15: qty 1

## 2021-05-15 NOTE — ED Triage Notes (Addendum)
Pt was BIBA from home for constipation, rectal pain, and abdominal pain "all over". Last BM 05/13/21. Pt has taken Miralax and stool softeners with no relief. Pt has taken hydrocodone and tramadol for pain. Denies N/V. Currently receiving chemo, last infusion on on 05/12/21.

## 2021-05-15 NOTE — ED Provider Notes (Signed)
[facility] EMERGENCY DEPT Provider Note   CSN: [id number] Arrival date & time: 05/15/21  2211     History Chief Complaint  Patient presents with   Constipation    Ashley Moore is a 70 y.o. female.   Constipation Associated symptoms: abdominal pain, back pain and nausea   Associated symptoms: no vomiting   Patient with history of metastatic anal cancer.  On chemotherapy.  States she feels as if she has to have a bowel movement cannot.  States she feels as if she is impacted.  Is on pain medicine.  Has had some mild nausea but not vomiting.  States her whole abdomen hurts.  Last chemo was on Wednesday with today being Saturday.  Pain is severe.  Unrelieved with pain medicines at home.  No fevers.    Past Medical History:  Diagnosis Date   Arthritis    Bowen's disease    excised 1992   Chronic low back pain    Chronic radiation cystitis    DDD (degenerative disc disease), cervical    Family history of breast cancer    Family history of lung cancer    Family history of non-Hodgkin's lymphoma    Family history of ovarian cancer    Headache    Hx of varicella    Hydronephrosis of right kidney    urologist--- dr Tresa Moore,  malignant,  treated with ureter stent   Hypothyroidism    followed by pcp   Lower urinary tract symptoms (LUTS)    01-12-2021  per pt treated with accupuncture at dr Tresa Moore office   Lymphedema of both lower extremities    PICC (peripherally inserted central catheter) in place 01/11/2021   placed at [facility] in [place], [place] for IV antibiotics   Positive blood culture 01/08/2021   Klebsiella Pneumaniae,  treated with daily IV antibiotic   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[facility]) oncologist--- dr Benay Spice   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,  invasive SCC , completed chemo/ radiation 12-23-2016;  recurrent metastatsis retroperitoneal lymphdenopathy,  completed radiation 04-13-2020 residual right ureteral uropathy obstruction   Retroperitoneal lymphadenopathy     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   Sepsis due to Klebsiella pneumoniae ([place]) 01/07/2021   pt admitted to [facility] in [place],  dx sepsis secondary to acute pyelonephritis with bacteremia , positive blood culture,  discharged 01-11-2021 home daily IV antibiotic   Wears glasses     Patient Active Problem List   Diagnosis Date Noted   Goals of care, counseling/discussion 03/01/2021   SIRS (systemic inflammatory response syndrome) ([place]) 01/13/2021   Pyelonephritis 01/13/2021   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   Genetic testing 01/27/2020   Family history of breast cancer    Family history of lung cancer    Family history of ovarian cancer    Family history of non-Hodgkin's lymphoma    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   Port catheter in place 11/21/2016   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   Anal cancer ([place]) 11/03/2016   Cystitis 05/06/2015   Degenerative cervical disc 04/08/2015   Trigger point of neck 03/26/2015   Pain in joint, ankle and foot 04/23/2013   Visit for preventive health examination 02/13/2013   Routine gynecological examination 02/13/2013   Family history of breast cancer in first degree relative 02/13/2013   Rash and nonspecific skin eruption 02/13/2013   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   VITAMIN D DEFICIENCY 06/25/2008   NECK PAIN 06/02/2008  FOOT PAIN 06/02/2008   Osteopenia 06/02/2008    Past Surgical History:  Procedure Laterality Date   BUNIONECTOMY Right yrs ago   CYSTOSCOPY W/ URETERAL STENT PLACEMENT Right 06/12/2020   Procedure: CYSTOSCOPY WITH RETROGRADE PYELOGRAM/URETERAL STENT EXCHANGE;  Surgeon: Alexis Frock, MD;  Location: [facility];  Service: Urology;  Laterality: Right;   CYSTOSCOPY W/ URETERAL STENT PLACEMENT Right 01/13/2021   Procedure: CYSTOSCOPY WITH RETROGRADE PYELOGRAM/URETERAL STENT REMOVALRIGHT;  Surgeon: Alexis Frock, MD;  Location: [facility];  Service: Urology;  Laterality: Right;  [place], URETEROSCOPY AND STENT PLACEMENT Right 01/28/2020   Procedure: CYSTOSCOPY WITH RETROGRADE PYELOGRAM, URETEROSCOPY AND STENT PLACEMENT;  Surgeon: Alexis Frock, MD;  Location: [facility];  Service: Urology;  Laterality: Right;   IR GENERIC HISTORICAL  11/14/2016   IR US GUIDE VASC ACCESS RIGHT 11/14/2016 [facility]   IR GENERIC HISTORICAL  11/14/2016   IR FLUORO GUIDE CV LINE RIGHT 11/14/2016 [facility]   IR GENERIC HISTORICAL  12/12/2016   IR US GUIDE VASC ACCESS RIGHT 12/12/2016 [name], MD [facility]   IR GENERIC HISTORICAL  12/12/2016   IR FLUORO GUIDE CV LINE RIGHT 12/12/2016 [name], MD [facility]   SKIN CANCER EXCISION  1990   bowens disease     OB History   No obstetric history on file.    Obstetric Comments  Last mammogram: 09/2018, BIRADS 1 Bone density: 07/2018 Last pap: 2018 - negative, HPV +         Family History  Problem Relation Age of Onset   Lung cancer Mother        lung   Breast cancer Mother 83   Ovarian cancer Mother 73   Pancreatitis Father    Breast cancer Sister 39   Breast cancer Maternal Grandmother 58       breast    Breast cancer Maternal Aunt 75       breast   Melanoma Sister    Breast cancer Sister 51   Non-Hodgkin's lymphoma Paternal Grandmother     Social History   Tobacco Use   Smoking status: Former    Packs/day: 1.00    Years: 25.00    Pack years: 25.00    Types: Cigarettes    Quit date: 06/20/1995    Years since quitting: 25.9   Smokeless tobacco: Never  Vaping Use   Vaping Use: Never used  Substance Use Topics   Alcohol use: Not Currently    Alcohol/week: 0.0 standard drinks    Comment: occasional wine   Drug use: Yes    Types: Marijuana    Comment: 01-12-2021  per pt once a week    Home Medications Prior to Admission medications   Medication Sig Start Date End Date  Taking? Authorizing Provider  acetaminophen (TYLENOL) 500 MG tablet Take 500 mg by mouth in the morning and at bedtime.    [provider]  calcium gluconate 500 MG tablet Take 1 tablet by mouth 3 (three) times daily. Takes 250 mg    [provider]  Cholecalciferol (VITAMIN D3 PO) Take 2,000 Units by mouth daily.    [provider]  conjugated estrogens (PREMARIN) vaginal cream Place 1 Applicatorful vaginally daily.    [provider]  dexamethasone (DECADRON) 2 MG tablet Take 5 tablets (10 mg total) by mouth as directed. Take 10 mg at 10 pm night before 1st Taxol treatment and repeat at 6 am day of 1st  Taxol treatment Patient not taking: No sig reported 03/04/21   [name], MD  diclofenac Sodium (VOLTAREN) 1 % GEL Apply topically 4 (four) times daily. To shoulder 03/04/21   [provider]  levothyroxine (SYNTHROID) 50 MCG tablet TAKE 1 TABLET EVERY DAY 12/17/20   Panosh, Standley Brooking, MD  LORazepam (ATIVAN) 0.5 MG tablet Take 1 tablet (0.5 mg total) by mouth every 12 (twelve) hours as needed for anxiety. 03/31/21   [name], NP  Misc Natural Products [facility] URINARY HEALTH PO) Take 1 capsule by mouth daily.    [provider]  Multiple Vitamins-Minerals (MULTIVITAMIN ADULT) CHEW Chew 1 capsule by mouth daily.    [provider]  MYRBETRIQ 50 MG TB24 tablet Take 50 mg by mouth daily. 01/19/21   [provider]  ondansetron (ZOFRAN) 8 MG tablet Take 1 tablet (8 mg total) by mouth every 8 (eight) hours as needed for nausea or vomiting. Start 72 hours after IV chemotherapy treatment Patient not taking: No sig reported 03/04/21   [name], MD  OVER THE COUNTER MEDICATION Place 1 drop into both eyes daily as needed (dry eyes).    [provider]  OVER THE COUNTER MEDICATION Take 3 tablets by mouth daily. [facility] by Dr. Ardeth Perfect    [provider]  oxyCODONE-acetaminophen (PERCOCET/ROXICET) 5-325 MG  tablet Take 1 tablet by mouth every 6 (six) hours as needed for severe pain. 05/14/21   [name], NP  polyethylene glycol (MIRALAX) 17 g packet Take 17 g by mouth daily. 03/15/21   Isla Pence, MD  prochlorperazine (COMPAZINE) 10 MG tablet Take 1 tablet (10 mg total) by mouth every 6 (six) hours as needed for nausea. Patient not taking: Reported on 05/12/2021 03/04/21   [name], MD  traMADol (ULTRAM) 50 MG tablet Take 1-2 tablets (50-100 mg total) by mouth every 6 (six) hours as needed for moderate pain. 04/21/21   [name], MD  Turmeric 500 MG CAPS Take 500 mg by mouth daily.    [provider]    Allergies    Benadryl [diphenhydramine], Citrus, Melatonin, Other, Lemon oil, and Penicillins  Review of Systems   Review of Systems  Constitutional:  Positive for appetite change.  HENT:  Negative for congestion.   Respiratory:  Negative for shortness of breath.   Cardiovascular:  Negative for chest pain.  Gastrointestinal:  Positive for abdominal pain, constipation, nausea and rectal pain. Negative for abdominal distention and vomiting.  Genitourinary:  Negative for flank pain.  Musculoskeletal:  Positive for back pain.  Skin:  Negative for rash.  Neurological:  Negative for weakness.   Physical Exam Updated Vital Signs BP 122/77   Pulse (!) 102   Temp 98.4 F (36.9 C)   Resp 17   SpO2 100%   Physical Exam Vitals and nursing note reviewed.  HENT:     Head: Atraumatic.  Eyes:     Pupils: Pupils are equal, round, and reactive to light.  Cardiovascular:     Rate and Rhythm: Regular rhythm.  Pulmonary:     Effort: Pulmonary effort is normal.  Abdominal:     Tenderness: There is abdominal tenderness.     Comments: Diffuse tenderness with some guarding.  Patient appears somewhat tearful.  Musculoskeletal:        General: No tenderness.     Cervical back: Neck supple.     Right lower leg: Edema present.     Left lower leg:  Edema present.  Skin:     General: Skin is warm.  Neurological:     Mental Status: She is alert and oriented to person, place, and time.    ED Results / Procedures / Treatments   Labs (all labs ordered are listed, but only abnormal results are displayed) Labs Reviewed  COMPREHENSIVE METABOLIC PANEL - Abnormal; Notable for the following components:      Result Value   Sodium 134 (*)    CO2 21 (*)    Glucose, Bld 101 (*)    All other components within normal limits  CBC WITH DIFFERENTIAL/PLATELET - Abnormal; Notable for the following components:   RBC 3.75 (*)    Hemoglobin 10.6 (*)    HCT 32.6 (*)    RDW 18.6 (*)    Neutro Abs 8.3 (*)    Lymphs Abs 0.2 (*)    All other components within normal limits  RESP PANEL BY RT-PCR (FLU A&B, COVID) ARPGX2  URINALYSIS, ROUTINE W REFLEX MICROSCOPIC    EKG None  Radiology DG Abdomen Acute W/Chest  Result Date: 05/15/2021 CLINICAL DATA:  Abdominal pain, constipation EXAM: DG ABDOMEN ACUTE WITH 1 VIEW CHEST COMPARISON:  CT chest abdomen pelvis dated 05/12/2021 FINDINGS: Increased interstitial markings. Known small pulmonary nodules are better evaluated on recent CT. No focal consolidation. No pleural effusion or pneumothorax. The heart is normal in size. Nonobstructive bowel gas pattern.  Normal colonic stool burden. No evidence of free air under the diaphragm on the upright view. Degenerative changes of the visualized thoracolumbar spine. IMPRESSION: No evidence of acute cardiopulmonary disease. No evidence of small bowel obstruction or free air. Normal colonic stool burden. Electronically Signed   By: Julian Hy M.D.   On: 05/15/2021 23:14    Procedures Procedures   Medications Ordered in ED Medications  iohexol (OMNIPAQUE) 9 MG/ML oral solution 500 mL (500 mLs Oral Contrast Given 05/15/21 2359)  HYDROmorphone (DILAUDID) injection 1 mg (1 mg Intravenous Given 05/15/21 2240)  HYDROmorphone (DILAUDID) injection 1 mg (1 mg Intravenous Given 05/15/21 2259)     ED Course  I have reviewed the triage vital signs and the nursing notes.  Pertinent labs & imaging results that were available during my care of the patient were reviewed by me and considered in my medical decision making (see chart for details).    MDM Rules/Calculators/A&P                           Patient with abdominal pain.  History of anal cancer.  Recent chemotherapy.  Initially diffusely tender.  Has had constipation.  There was rectal stool and some tenderness but no clear mass felt.  Unable to get good disimpaction due to patient discomfort.  Had been given pain medicines and feels somewhat better.  Less tender on abdomen.  Lab work initially reassuring.  Acute abdominal series reassuring.  We will add CT for further evaluation due to cancer and chemotherapy.  Then reevaluate by Dr. Nicholes Stairs to help determine further treatment and disposition. Final Clinical Impression(s) / ED Diagnoses Final diagnoses:  Lower abdominal pain    Rx / DC Orders ED Discharge Orders     None        Davonna Belling, MD 05/16/21 [phone]

## 2021-05-16 ENCOUNTER — Encounter (HOSPITAL_BASED_OUTPATIENT_CLINIC_OR_DEPARTMENT_OTHER): Payer: Self-pay | Admitting: Radiology

## 2021-05-16 ENCOUNTER — Emergency Department (HOSPITAL_BASED_OUTPATIENT_CLINIC_OR_DEPARTMENT_OTHER): Payer: Medicare HMO

## 2021-05-16 ENCOUNTER — Other Ambulatory Visit: Payer: Self-pay

## 2021-05-16 DIAGNOSIS — D696 Thrombocytopenia, unspecified: Secondary | ICD-10-CM | POA: Diagnosis present

## 2021-05-16 DIAGNOSIS — E871 Hypo-osmolality and hyponatremia: Secondary | ICD-10-CM

## 2021-05-16 DIAGNOSIS — N261 Atrophy of kidney (terminal): Secondary | ICD-10-CM | POA: Diagnosis present

## 2021-05-16 DIAGNOSIS — C799 Secondary malignant neoplasm of unspecified site: Secondary | ICD-10-CM

## 2021-05-16 DIAGNOSIS — R109 Unspecified abdominal pain: Secondary | ICD-10-CM | POA: Diagnosis present

## 2021-05-16 DIAGNOSIS — N10 Acute tubulo-interstitial nephritis: Secondary | ICD-10-CM

## 2021-05-16 DIAGNOSIS — Z87891 Personal history of nicotine dependence: Secondary | ICD-10-CM | POA: Diagnosis not present

## 2021-05-16 DIAGNOSIS — R1909 Other intra-abdominal and pelvic swelling, mass and lump: Secondary | ICD-10-CM | POA: Diagnosis present

## 2021-05-16 DIAGNOSIS — R112 Nausea with vomiting, unspecified: Secondary | ICD-10-CM | POA: Diagnosis not present

## 2021-05-16 DIAGNOSIS — Z888 Allergy status to other drugs, medicaments and biological substances status: Secondary | ICD-10-CM | POA: Diagnosis not present

## 2021-05-16 DIAGNOSIS — Z807 Family history of other malignant neoplasms of lymphoid, hematopoietic and related tissues: Secondary | ICD-10-CM | POA: Diagnosis not present

## 2021-05-16 DIAGNOSIS — N136 Pyonephrosis: Secondary | ICD-10-CM | POA: Diagnosis present

## 2021-05-16 DIAGNOSIS — C772 Secondary and unspecified malignant neoplasm of intra-abdominal lymph nodes: Secondary | ICD-10-CM

## 2021-05-16 DIAGNOSIS — K5900 Constipation, unspecified: Secondary | ICD-10-CM | POA: Diagnosis not present

## 2021-05-16 DIAGNOSIS — N139 Obstructive and reflux uropathy, unspecified: Secondary | ICD-10-CM

## 2021-05-16 DIAGNOSIS — G8929 Other chronic pain: Secondary | ICD-10-CM | POA: Diagnosis present

## 2021-05-16 DIAGNOSIS — Z880 Allergy status to penicillin: Secondary | ICD-10-CM | POA: Diagnosis not present

## 2021-05-16 DIAGNOSIS — E039 Hypothyroidism, unspecified: Secondary | ICD-10-CM

## 2021-05-16 DIAGNOSIS — N1330 Unspecified hydronephrosis: Secondary | ICD-10-CM

## 2021-05-16 DIAGNOSIS — C210 Malignant neoplasm of anus, unspecified: Secondary | ICD-10-CM | POA: Diagnosis not present

## 2021-05-16 DIAGNOSIS — Z20822 Contact with and (suspected) exposure to covid-19: Secondary | ICD-10-CM | POA: Diagnosis present

## 2021-05-16 DIAGNOSIS — Z85828 Personal history of other malignant neoplasm of skin: Secondary | ICD-10-CM | POA: Diagnosis not present

## 2021-05-16 DIAGNOSIS — Z6830 Body mass index (BMI) 30.0-30.9, adult: Secondary | ICD-10-CM | POA: Diagnosis not present

## 2021-05-16 DIAGNOSIS — M545 Low back pain, unspecified: Secondary | ICD-10-CM | POA: Diagnosis present

## 2021-05-16 DIAGNOSIS — Z8041 Family history of malignant neoplasm of ovary: Secondary | ICD-10-CM | POA: Diagnosis not present

## 2021-05-16 DIAGNOSIS — N3000 Acute cystitis without hematuria: Secondary | ICD-10-CM | POA: Diagnosis not present

## 2021-05-16 DIAGNOSIS — E669 Obesity, unspecified: Secondary | ICD-10-CM

## 2021-05-16 DIAGNOSIS — Z808 Family history of malignant neoplasm of other organs or systems: Secondary | ICD-10-CM | POA: Diagnosis not present

## 2021-05-16 DIAGNOSIS — Z801 Family history of malignant neoplasm of trachea, bronchus and lung: Secondary | ICD-10-CM | POA: Diagnosis not present

## 2021-05-16 DIAGNOSIS — Z86007 Personal history of in-situ neoplasm of skin: Secondary | ICD-10-CM | POA: Diagnosis not present

## 2021-05-16 DIAGNOSIS — Z803 Family history of malignant neoplasm of breast: Secondary | ICD-10-CM | POA: Diagnosis not present

## 2021-05-16 DIAGNOSIS — Z79899 Other long term (current) drug therapy: Secondary | ICD-10-CM | POA: Diagnosis not present

## 2021-05-16 DIAGNOSIS — D638 Anemia in other chronic diseases classified elsewhere: Secondary | ICD-10-CM | POA: Diagnosis present

## 2021-05-16 LAB — URINALYSIS, ROUTINE W REFLEX MICROSCOPIC
Bilirubin Urine: NEGATIVE
Glucose, UA: NEGATIVE mg/dL
Nitrite: POSITIVE — AB
Protein, ur: 30 mg/dL — AB
RBC / HPF: >50 RBC/hpf — ABNORMAL HIGH (ref 0–5)
Specific Gravity, Urine: >1.046 — ABNORMAL HIGH (ref 1.005–1.030)
pH: 7.5 (ref 5.0–8.0)

## 2021-05-16 LAB — RESP PANEL BY RT-PCR (FLU A&B, COVID) ARPGX2
Influenza A by PCR: NEGATIVE
Influenza B by PCR: NEGATIVE
SARS Coronavirus 2 by RT PCR: NEGATIVE

## 2021-05-16 LAB — PROTIME-INR
INR: 1.2 (ref 0.8–1.2)
Prothrombin Time: 15.2 seconds (ref 11.4–15.2)

## 2021-05-16 MED ORDER — TRAMADOL HCL 50 MG PO TABS
50.0000 mg | ORAL_TABLET | Freq: Four times a day (QID) | ORAL | Status: DC | PRN
  Administered 2021-05-17: 50 mg via ORAL
  Filled 2021-05-16: qty 1

## 2021-05-16 MED ORDER — SODIUM CHLORIDE 0.9 % IV SOLN
2.0000 g | INTRAVENOUS | Status: DC
  Administered 2021-05-17 – 2021-05-18 (×2): 2 g via INTRAVENOUS
  Filled 2021-05-16 (×2): qty 2

## 2021-05-16 MED ORDER — ONDANSETRON HCL 4 MG/2ML IJ SOLN: 4.0000 mg | Freq: Four times a day (QID) | INTRAMUSCULAR | Status: DC | PRN

## 2021-05-16 MED ORDER — POLYETHYLENE GLYCOL 3350 17 G PO PACK
17.0000 g | PACK | Freq: Every day | ORAL | Status: DC
  Administered 2021-05-18: 17 g via ORAL
  Filled 2021-05-16: qty 1

## 2021-05-16 MED ORDER — MIRABEGRON ER 25 MG PO TB24
50.0000 mg | ORAL_TABLET | Freq: Every day | ORAL | Status: DC
  Administered 2021-05-16 – 2021-05-18 (×3): 50 mg via ORAL
  Filled 2021-05-16 (×3): qty 2

## 2021-05-16 MED ORDER — ONDANSETRON HCL 4 MG/2ML IJ SOLN: 4.0000 mg | Freq: Four times a day (QID) | INTRAMUSCULAR | Status: DC

## 2021-05-16 MED ORDER — FENTANYL CITRATE (PF) 100 MCG/2ML IJ SOLN
50.0000 ug | Freq: Once | INTRAMUSCULAR | Status: AC
  Administered 2021-05-16: 50 ug via INTRAVENOUS
  Filled 2021-05-16: qty 2

## 2021-05-16 MED ORDER — ACETAMINOPHEN 500 MG PO TABS
1000.0000 mg | ORAL_TABLET | Freq: Once | ORAL | Status: AC
  Administered 2021-05-16: 1000 mg via ORAL
  Filled 2021-05-16: qty 2

## 2021-05-16 MED ORDER — SODIUM CHLORIDE 0.9 % IV SOLN
1.0000 g | Freq: Once | INTRAVENOUS | Status: AC
  Administered 2021-05-16: 1 g via INTRAVENOUS
  Filled 2021-05-16: qty 1

## 2021-05-16 MED ORDER — HYDROMORPHONE HCL 2 MG PO TABS
2.0000 mg | ORAL_TABLET | ORAL | Status: DC | PRN
Start: 2021-05-16 — End: 2021-05-18
  Administered 2021-05-16: 2 mg via ORAL
  Filled 2021-05-16: qty 1

## 2021-05-16 MED ORDER — SODIUM CHLORIDE 0.9 % IV SOLN
1.0000 g | Freq: Once | INTRAVENOUS | Status: AC
  Administered 2021-05-16: 1 g via INTRAVENOUS
  Filled 2021-05-16: qty 10

## 2021-05-16 MED ORDER — ACETAMINOPHEN 650 MG RE SUPP: 650.0000 mg | Freq: Four times a day (QID) | RECTAL | Status: DC | PRN

## 2021-05-16 MED ORDER — IOHEXOL 350 MG/ML SOLN
75.0000 mL | Freq: Once | INTRAVENOUS | Status: AC | PRN
  Administered 2021-05-16: 75 mL via INTRAVENOUS

## 2021-05-16 MED ORDER — OXYCODONE-ACETAMINOPHEN 5-325 MG PO TABS: 1.0000 | ORAL_TABLET | Freq: Four times a day (QID) | ORAL | Status: DC | PRN

## 2021-05-16 MED ORDER — ACETAMINOPHEN 325 MG PO TABS
650.0000 mg | ORAL_TABLET | Freq: Four times a day (QID) | ORAL | Status: DC | PRN
  Administered 2021-05-16: 650 mg via ORAL
  Filled 2021-05-16: qty 2

## 2021-05-16 MED ORDER — ACETAMINOPHEN 500 MG PO TABS
1000.0000 mg | ORAL_TABLET | Freq: Three times a day (TID) | ORAL | Status: DC | PRN
  Administered 2021-05-16 – 2021-05-18 (×5): 1000 mg via ORAL
  Filled 2021-05-16 (×5): qty 2

## 2021-05-16 MED ORDER — ONDANSETRON HCL 4 MG PO TABS
4.0000 mg | ORAL_TABLET | Freq: Four times a day (QID) | ORAL | Status: DC | PRN
  Filled 2021-05-16: qty 1

## 2021-05-16 MED ORDER — LORAZEPAM 0.5 MG PO TABS: 0.5000 mg | ORAL_TABLET | Freq: Two times a day (BID) | ORAL | Status: DC | PRN

## 2021-05-16 MED ORDER — MORPHINE SULFATE (PF) 2 MG/ML IV SOLN: 2.0000 mg | INTRAVENOUS | Status: DC | PRN

## 2021-05-16 MED ORDER — LEVOTHYROXINE SODIUM 50 MCG PO TABS
50.0000 ug | ORAL_TABLET | Freq: Every day | ORAL | Status: DC
  Administered 2021-05-17 – 2021-05-18 (×2): 50 ug via ORAL
  Filled 2021-05-16 (×2): qty 1

## 2021-05-16 NOTE — ED Notes (Signed)
Pt given diet sprite/ raisin bran

## 2021-05-16 NOTE — H&P (Signed)
History and Physical    CHRISTMAS STUTEVILLE [id number] DOB: [birth year] DOA: 05/15/2021  PCP: [name], MD Patient coming from:   Chief Complaint: Nausea and abdominal pain  HPI: Ashley Moore is a 70 y.o. female with medical history significant of metastatic anal cancer under current chemotherapy, right kidney hydronephrosis, chronic low back pain, chronic radiation cystitis, hypothyroidism, who presented with nausea and abdominal pain.  Patient reports that her last chemotherapy was 1 week ago.  She started to have nausea and abdominal pain last night.  Her abdomen pain was located to the entire abdomen, sharp pain, constant, severe pain with no radiation.  No aggravating or alleviating factors.  Denies fevers, chills, shortness of breath, cough, wheezing, chest pain, vomiting, diarrhea, dysuria, urinary frequency or urgency.  Patient came to the [facility] ED for further evaluation last night.  Her vitals showed temperature 98.4 F, pulse 97, RR 22, BP 148/72, room air O2 sats 100%.  The labs showed sodium 134, nonrevealing CMP, WBC 8.7, UA showing pyuria, negative COVID, negative influenza.  Abdomen CT showed "Severe right hydronephrosis with right renal parenchyma atrophy similar to prior CT and secondary to obstruction of the proximal right ureter by retroperitoneal soft tissue mass/adenopathy on the right psoas muscle".  Patient was transferred to [facility] for further management.   Review of Systems: As per HPI otherwise 10 point review of systems negative.  Review of Systems Otherwise negative except as per HPI, including: General: Denies fever, chills, night sweats or unintended weight loss. Resp: Denies cough, wheezing, shortness of breath. Cardiac: Denies chest pain, palpitations, orthopnea, paroxysmal nocturnal dyspnea. GI: Denies vomiting, diarrhea or constipation.  Positive for nausea and abdominal pain. GU: Denies dysuria, frequency, hesitancy or  incontinence MS: Denies muscle aches, joint pain or swelling Neuro: Denies headache, neurologic deficits (focal weakness, numbness, tingling), abnormal gait Psych: Denies anxiety, depression, SI/HI/AVH Skin: Denies new rashes or lesions ID: Denies sick contacts, exotic exposures, travel  Past Medical History:  Diagnosis Date   Arthritis    Bowen's disease    excised 1992   Chronic low back pain    Chronic radiation cystitis    DDD (degenerative disc disease), cervical    Family history of breast cancer    Family history of lung cancer    Family history of non-Hodgkin's lymphoma    Family history of ovarian cancer    Headache    Hx of varicella    Hydronephrosis of right kidney    urologist--- dr Tresa Moore,  malignant,  treated with ureter stent   Hypothyroidism    followed by pcp   Lower urinary tract symptoms (LUTS)    01-12-2021  per pt treated with accupuncture at dr Tresa Moore office   Lymphedema of both lower extremities    PICC (peripherally inserted central catheter) in place 01/11/2021   placed at [facility] in [place], [place] for IV antibiotics   Positive blood culture 01/08/2021   Klebsiella Pneumaniae,  treated with daily IV antibiotic   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[facility]) oncologist--- dr Benay Spice   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,  invasive SCC , completed chemo/ radiation 12-23-2016;  recurrent metastatsis retroperitoneal lymphdenopathy,  completed radiation 04-13-2020 residual right ureteral uropathy obstruction   Retroperitoneal lymphadenopathy    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   Sepsis due to Klebsiella pneumoniae ([place]) 01/07/2021   pt admitted to [facility] in [place],  dx sepsis secondary to acute pyelonephritis with bacteremia , positive  blood culture,  discharged 01-11-2021 home daily IV antibiotic   Wears glasses     Past Surgical History:  Procedure Laterality Date   BUNIONECTOMY Right yrs ago   [place] Right 06/12/2020   Procedure: CYSTOSCOPY WITH RETROGRADE PYELOGRAM/URETERAL STENT EXCHANGE;  Surgeon: Alexis Frock, MD;  Location: [facility];  Service: Urology;  Laterality: Right;   CYSTOSCOPY W/ URETERAL STENT PLACEMENT Right 01/13/2021   Procedure: CYSTOSCOPY WITH RETROGRADE PYELOGRAM/URETERAL STENT REMOVALRIGHT;  Surgeon: Alexis Frock, MD;  Location: [facility];  Service: Urology;  Laterality: Right;  [place], URETEROSCOPY AND STENT PLACEMENT Right 01/28/2020   Procedure: CYSTOSCOPY WITH RETROGRADE PYELOGRAM, URETEROSCOPY AND STENT PLACEMENT;  Surgeon: Alexis Frock, MD;  Location: [facility];  Service: Urology;  Laterality: Right;   IR GENERIC HISTORICAL  11/14/2016   IR US GUIDE VASC ACCESS RIGHT 11/14/2016 [facility]   IR GENERIC HISTORICAL  11/14/2016   IR FLUORO GUIDE CV LINE RIGHT 11/14/2016 [facility]   IR GENERIC HISTORICAL  12/12/2016   IR US GUIDE VASC ACCESS RIGHT 12/12/2016 [name], MD [facility]   IR GENERIC HISTORICAL  12/12/2016   IR FLUORO GUIDE CV LINE RIGHT 12/12/2016 [name], MD [facility]   SKIN CANCER EXCISION  1990   bowens disease    SOCIAL HISTORY:  reports that she quit smoking about 25 years ago. Her smoking use included cigarettes. She has a 25.00 pack-year smoking history. She has never used smokeless tobacco. She reports that she does not currently use alcohol. She reports current drug use. Drug: Marijuana.  Allergies  Allergen Reactions   Benadryl [Diphenhydramine] Other (See Comments)    Tingle all over    Citrus Diarrhea   Melatonin     Tingle all over    Other Diarrhea    greenbeans and citrus   Lemon Oil Diarrhea   Penicillins Rash    Did it involve swelling of the face/tongue/throat, SOB, or low BP? N Did it involve sudden or severe rash/hives, skin peeling, or any reaction on the inside of your mouth or nose? Y Did you need to seek medical attention at  a hospital or doctor's office? N When did it last happen? Almost 50 years Ago      If all above answers are "NO", may proceed with cephalosporin use.     FAMILY HISTORY: Family History  Problem Relation Age of Onset   Lung cancer Mother        lung   Breast cancer Mother 38   Ovarian cancer Mother 9   Pancreatitis Father    Breast cancer Sister 61   Breast cancer Maternal Grandmother 55       breast    Breast cancer Maternal Aunt 75       breast   Melanoma Sister    Breast cancer Sister 16   Non-Hodgkin's lymphoma Paternal Grandmother      Prior to Admission medications   Medication Sig Start Date End Date Taking? Authorizing Provider  acetaminophen (TYLENOL) 500 MG tablet Take 500 mg by mouth in the morning and at bedtime.    [provider]  calcium gluconate 500 MG tablet Take 1 tablet by mouth 3 (three) times daily. Takes 250 mg    [provider]  Cholecalciferol (VITAMIN D3 PO) Take 2,000 Units by mouth daily.    [provider]  conjugated estrogens (PREMARIN) vaginal cream Place 1 Applicatorful vaginally daily.  [provider]  dexamethasone (DECADRON) 2 MG tablet Take 5 tablets (10 mg total) by mouth as directed. Take 10 mg at 10 pm night before 1st Taxol treatment and repeat at 6 am day of 1st Taxol treatment Patient not taking: No sig reported 03/04/21   [name], MD  diclofenac Sodium (VOLTAREN) 1 % GEL Apply topically 4 (four) times daily. To shoulder 03/04/21   [provider]  levothyroxine (SYNTHROID) 50 MCG tablet TAKE 1 TABLET EVERY DAY 12/17/20   Panosh, Standley Brooking, MD  LORazepam (ATIVAN) 0.5 MG tablet Take 1 tablet (0.5 mg total) by mouth every 12 (twelve) hours as needed for anxiety. 03/31/21   [name], NP  Misc Natural Products [facility] URINARY HEALTH PO) Take 1 capsule by mouth daily.    [provider]  Multiple Vitamins-Minerals (MULTIVITAMIN ADULT) CHEW Chew 1 capsule by mouth daily.     [provider]  MYRBETRIQ 50 MG TB24 tablet Take 50 mg by mouth daily. 01/19/21   [provider]  ondansetron (ZOFRAN) 8 MG tablet Take 1 tablet (8 mg total) by mouth every 8 (eight) hours as needed for nausea or vomiting. Start 72 hours after IV chemotherapy treatment Patient not taking: No sig reported 03/04/21   [name], MD  OVER THE COUNTER MEDICATION Place 1 drop into both eyes daily as needed (dry eyes).    [provider]  OVER THE COUNTER MEDICATION Take 3 tablets by mouth daily. Total [facility] by Dr. Ardeth Perfect    [provider]  oxyCODONE-acetaminophen (PERCOCET/ROXICET) 5-325 MG tablet Take 1 tablet by mouth every 6 (six) hours as needed for severe pain. 05/14/21   [name], NP  polyethylene glycol (MIRALAX) 17 g packet Take 17 g by mouth daily. 03/15/21   Isla Pence, MD  prochlorperazine (COMPAZINE) 10 MG tablet Take 1 tablet (10 mg total) by mouth every 6 (six) hours as needed for nausea. Patient not taking: Reported on 05/12/2021 03/04/21   [name], MD  traMADol (ULTRAM) 50 MG tablet Take 1-2 tablets (50-100 mg total) by mouth every 6 (six) hours as needed for moderate pain. 04/21/21   [name], MD  Turmeric 500 MG CAPS Take 500 mg by mouth daily.    [provider]    Physical Exam: Vitals:   05/16/21 0700 05/16/21 0949 05/16/21 0954 05/16/21 1331  BP: 134/72 136/78  128/67  Pulse: 86 91  93  Resp: '20 17  17  '$ Temp:  97.8 F (36.6 C)  98.4 F (36.9 C)  TempSrc:  Oral  Oral  SpO2: 96% 100%  100%  Weight:   74.6 kg   Height:   '5\' 2"'$  (1.575 m)       Constitutional: NAD, calm, comfortable Eyes: PERRL, lids and conjunctivae normal ENMT: Mucous membranes are moist. Posterior pharynx clear of any exudate or lesions.Normal dentition.  Neck: normal, supple, no masses, no thyromegaly Respiratory: clear to auscultation bilaterally, no wheezing, no crackles. Normal respiratory effort. No accessory muscle use.   Cardiovascular: Regular rate and rhythm, no murmurs / rubs / gallops. No extremity edema. 2+ pedal pulses. No carotid bruits.  Abdomen: no tenderness, no masses palpated. No hepatosplenomegaly. Bowel sounds positive.  Right-sided CVA tenderness Musculoskeletal: no clubbing / cyanosis. No joint deformity upper and lower extremities. Good ROM, no contractures. Normal muscle tone.  Skin: no rashes, lesions, ulcers. No induration Neurologic: CN 2-12 grossly intact. Sensation intact, DTR normal. Strength 5/5 in all 4.  Psychiatric: Normal judgment and insight. Alert and oriented x 3. Normal mood.     Labs on Admission: I have personally reviewed following labs and imaging studies  CBC: Recent Labs  Lab 05/11/21 0819 05/15/21 2230  WBC 5.8 8.7  NEUTROABS 4.8 8.3*  HGB 10.1* 10.6*  HCT 32.4* 32.6*  MCV 90.0 86.9  PLT 209 [id number]   Basic Metabolic Panel: Recent Labs  Lab 05/11/21 0819 05/15/21 2230  Tinea Nobile 137 134*  K 4.1 3.7  CL 103 101  CO2 26 21*  GLUCOSE 87 101*  BUN 22 18  CREATININE 0.91 0.73  CALCIUM 9.5 9.6   GFR: Estimated Creatinine Clearance: 61 mL/min (by C-G formula based on SCr of 0.73 mg/dL). Liver Function Tests: Recent Labs  Lab 05/11/21 0819 05/15/21 2230  AST 19 26  ALT 9 13  ALKPHOS 76 64  BILITOT 0.3 0.6  PROT 7.4 7.2  ALBUMIN 3.9 3.7   No results for input(s): LIPASE, AMYLASE in the last 168 hours. No results for input(s): AMMONIA in the last 168 hours. Coagulation Profile: No results for input(s): INR, PROTIME in the last 168 hours. Cardiac Enzymes: No results for input(s): CKTOTAL, CKMB, CKMBINDEX, TROPONINI in the last 168 hours. BNP (last 3 results) No results for input(s): PROBNP in the last 8760 hours. HbA1C: No results for input(s): HGBA1C in the last 72 hours. CBG: No results for input(s): GLUCAP in the last 168 hours. Lipid Profile: No results for input(s): CHOL, HDL, LDLCALC, TRIG, CHOLHDL, LDLDIRECT in the last 72 hours. Thyroid  Function Tests: No results for input(s): TSH, T4TOTAL, FREET4, T3FREE, THYROIDAB in the last 72 hours. Anemia Panel: No results for input(s): VITAMINB12, FOLATE, FERRITIN, TIBC, IRON, RETICCTPCT in the last 72 hours. Urine analysis:    Component Value Date/Time   COLORURINE YELLOW 05/16/2021 0157   APPEARANCEUR HAZY (A) 05/16/2021 0157   LABSPEC >1.046 (H) 05/16/2021 0157   LABSPEC 1.010 02/14/2017 1604   PHURINE 7.5 05/16/2021 0157   GLUCOSEU NEGATIVE 05/16/2021 0157   GLUCOSEU Negative 02/14/2017 1604   HGBUR MODERATE (A) 05/16/2021 0157   BILIRUBINUR NEGATIVE 05/16/2021 0157   BILIRUBINUR Negative 01/08/2020 0949   BILIRUBINUR Negative 02/14/2017 1604   KETONESUR TRACE (A) 05/16/2021 0157   PROTEINUR 30 (A) 05/16/2021 0157   UROBILINOGEN 0.2 01/08/2020 0949   UROBILINOGEN 0.2 02/14/2017 1604   NITRITE POSITIVE (A) 05/16/2021 0157   LEUKOCYTESUR LARGE (A) 05/16/2021 0157   LEUKOCYTESUR Small 02/14/2017 1604   Sepsis Labs: !!!!!!!!!!!!!!!!!!!!!!!!!!!!!!!!!!!!!!!!!!!! '@LABRCNTIP'$ (procalcitonin:4,lacticidven:4) ) Recent Results (from the past 240 hour(s))  Resp Panel by RT-PCR (Flu A&B, Covid) Nasopharyngeal Swab     Status: None   Collection Time: 05/15/21 11:02 PM   Specimen: Nasopharyngeal Swab; Nasopharyngeal(NP) swabs in vial transport medium  Result Value Ref Range Status   SARS Coronavirus 2 by RT PCR NEGATIVE NEGATIVE Final    Comment: (NOTE) SARS-CoV-2 target nucleic acids are NOT DETECTED.  The SARS-CoV-2 RNA is generally detectable in upper respiratory specimens during the acute phase of infection. The lowest concentration of SARS-CoV-2 viral copies this assay can detect is 138 copies/mL. A negative result does not preclude SARS-Cov-2 infection and should not be used as the sole basis for treatment or other patient management decisions. A negative result may occur with  improper specimen collection/handling, submission of specimen other than nasopharyngeal swab,  presence of viral mutation(s) within the areas targeted by this assay, and inadequate number of viral copies(<138 copies/mL). A negative result must be combined with clinical observations, patient history,  and epidemiological information. The expected result is Negative.  Fact Sheet for Patients:  EntrepreneurPulse.com.au  Fact Sheet for Healthcare Providers:  IncredibleEmployment.be  This test is no t yet approved or cleared by the [place] FDA and  has been authorized for detection and/or diagnosis of SARS-CoV-2 by FDA under an Emergency Use Authorization (EUA). This EUA will remain  in effect (meaning this test can be used) for the duration of the COVID-19 declaration under Section 564(b)(1) of the Act, 21 U.S.C.section 360bbb-3(b)(1), unless the authorization is terminated  or revoked sooner.       Influenza A by PCR NEGATIVE NEGATIVE Final   Influenza B by PCR NEGATIVE NEGATIVE Final    Comment: (NOTE) The Xpert Xpress SARS-CoV-2/FLU/RSV plus assay is intended as an aid in the diagnosis of influenza from Nasopharyngeal swab specimens and should not be used as a sole basis for treatment. Nasal washings and aspirates are unacceptable for Xpert Xpress SARS-CoV-2/FLU/RSV testing.  Fact Sheet for Patients: EntrepreneurPulse.com.au  Fact Sheet for Healthcare Providers: IncredibleEmployment.be  This test is not yet approved or cleared by the [place] FDA and has been authorized for detection and/or diagnosis of SARS-CoV-2 by FDA under an Emergency Use Authorization (EUA). This EUA will remain in effect (meaning this test can be used) for the duration of the COVID-19 declaration under Section 564(b)(1) of the Act, 21 U.S.C. section 360bbb-3(b)(1), unless the authorization is terminated or revoked.  Performed at [facility], [address], [place], [place] [address]       Radiological Exams on Admission: CT ABDOMEN PELVIS W CONTRAST  Result Date: 05/16/2021 CLINICAL DATA:  Left lower quadrant abdominal pain. Metastatic anal cancer. EXAM: CT ABDOMEN AND PELVIS WITH CONTRAST TECHNIQUE: Multidetector CT imaging of the abdomen and pelvis was performed using the standard protocol following bolus administration of intravenous contrast. CONTRAST:  23m OMNIPAQUE IOHEXOL 350 MG/ML SOLN COMPARISON:  CT of the chest abdomen pelvis dated 05/11/2021. FINDINGS: Lower chest: The visualized lung bases are clear. No intra-abdominal free air.  Trace free fluid in the pelvis Hepatobiliary: Subcentimeter left hepatic hypodense focus is too small to characterize. No intrahepatic biliary ductal dilatation. The gallbladder is unremarkable. Pancreas: The pancreas is atrophic and poorly visualized. No active inflammatory changes. Spleen: Choose wall Adrenals/Urinary Tract: The adrenal glands are unremarkable. There is severe right hydronephrosis with right renal parenchyma atrophy. There is obstruction of the proximal right ureter secondary to retroperitoneal soft tissue mass implanted in the right psoas muscle. The left kidney is unremarkable. There is thickened appearance of the urinary bladder as seen on the prior CT. Stomach/Bowel: Perirectal stranding and circumferential thickening of the rectal wall as seen previously and may be related to post radiation changes. There is sigmoid diverticulosis without active inflammatory changes. There is moderate stool throughout the colon. There is no bowel obstruction. The appendix is normal. Vascular/Lymphatic: Mild aortoiliac atherosclerotic disease. There is compression of the IVC secondary to a 4.4 x 2.4 cm (36/2) retroperitoneal mass/adenopathy on the psoas muscle. No portal venous gas. Reproductive: The uterus is grossly unremarkable. No adnexal masses. Other: None Musculoskeletal: None degenerative changes of the spine and hips. No acute osseous  pathology. IMPRESSION: 1. Severe right hydronephrosis with right renal parenchyma atrophy similar to prior CT and secondary to obstruction of the proximal right ureter by retroperitoneal soft tissue mass/adenopathy on the right psoas muscle. 2. Thickened appearance of the urinary bladder and rectum as seen previously and may be related to post radiation changes. 3. Sigmoid diverticulosis.  No bowel obstruction. Normal appendix. 4. Aortic Atherosclerosis (ICD10-I70.0). Electronically Signed   By: Anner Crete M.D.   On: 05/16/2021 01:35   DG Abdomen Acute W/Chest  Result Date: 05/15/2021 CLINICAL DATA:  Abdominal pain, constipation EXAM: DG ABDOMEN ACUTE WITH 1 VIEW CHEST COMPARISON:  CT chest abdomen pelvis dated 05/12/2021 FINDINGS: Increased interstitial markings. Known small pulmonary nodules are better evaluated on recent CT. No focal consolidation. No pleural effusion or pneumothorax. The heart is normal in size. Nonobstructive bowel gas pattern.  Normal colonic stool burden. No evidence of free air under the diaphragm on the upright view. Degenerative changes of the visualized thoracolumbar spine. IMPRESSION: No evidence of acute cardiopulmonary disease. No evidence of small bowel obstruction or free air. Normal colonic stool burden. Electronically Signed   By: Julian Hy M.D.   On: 05/15/2021 23:14     All images have been reviewed by me personally.  EKG: Independently reviewed.   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   -Patient is hemodynamically stable.  Does not meet criteria for sepsis. -Blood and urine cultures are pending ( ordered upon arrival to [facility] hospital, she had received Abx at outside ED) -Ceftriaxone 2 g daily -CT scan demonstrated right severe hydronephrosis due to obstructive uropathy.  Patient states that she lost follow-up with urology and does not know what to do with her condition.  I have called on-call urologist and discussed with on-call urologist for a consult.   #Metastatic anal cancer under current chemotherapy -Follow-up with oncology as outpatient  #Chronic back pain -Chronic and stable  #hypothyroidism -Chronic and stable  #Hyponatremia -Sodium 134 -Increase oral intake -Follow-up BMP in the morning  #Obesity Body mass index is 30.09 kg/m.    -weight loss per PCP as outpatient        DVT prophylaxis: SCD pending neurology consult Code Status: Full code Family Communication: None present Consults called: Urology Admission status: Observation  Status is: Observation  The patient remains OBS appropriate and will d/c before 2 midnights.  Dispo: The patient is from: Home              Anticipated d/c is to: Home              Patient currently is not medically stable to d/c.   Difficult to place patient No       Time Spent: 65 minutes.  >50% of the time was devoted to discussing the patients care, assessment, plan and disposition with other care givers along with counseling the patient about the risks and benefits of treatment.    Charlann Lange MD [facility]  If 7PM-7AM, please contact night-coverage   05/16/2021, 2:54 PM

## 2021-05-16 NOTE — Consult Note (Addendum)
Urology Consult Note   Requesting Attending Physician:  Charlann Lange, MD Service Providing Consult: Urology  Consulting Attending: Dr. Rexene Alberts   Reason for Consult:  Right hydronephrosis, UTI  HPI: Ashley Moore is seen in consultation for reasons noted above at the request of Nicoletta Dress, Na, MD for evaluation of right hydronephrosis 2/2 malignant obstruction and UTI.  This is a 70 y.o. female with Hx of metastatic rectal cancer currently on chemotherapy c/b right malignant ureteral obstruction w/ hydronephrosis. Previously she had a stent on the right due to obstruction from her adenopathy; however, she had improvement of her adenopathy so her stent was removed in 12/2020. She did not tolerate the stent well with increased irritative voiding symptoms while it was in place. She presented to the ED with diffuse abdominal pain yesterday evening and nausea. Primary diagnosis of constipation; however, CT imaging demonstrated right hydronephrosis 2/2 soft tissue obstruction. So she was transferred to [facility] for additional workup. She was also incidentally found to have a UTI. She is feeling significantly better after having some bowel movements. She otherwise denies any dysuria or LUTS. She denied any fevers/chills prior to admission.   Past Medical History: Past Medical History:  Diagnosis Date   Arthritis    Bowen's disease    excised 1992   Chronic low back pain    Chronic radiation cystitis    DDD (degenerative disc disease), cervical    Family history of breast cancer    Family history of lung cancer    Family history of non-Hodgkin's lymphoma    Family history of ovarian cancer    Headache    Hx of varicella    Hydronephrosis of right kidney    urologist--- dr Tresa Moore,  malignant,  treated with ureter stent   Hypothyroidism    followed by pcp   Lower urinary tract symptoms (LUTS)    01-12-2021  per pt treated with accupuncture at dr Tresa Moore office   Lymphedema of both lower  extremities    PICC (peripherally inserted central catheter) in place 01/11/2021   placed at [facility] in [place], [place] for IV antibiotics   Positive blood culture 01/08/2021   Klebsiella Pneumaniae,  treated with daily IV antibiotic   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[facility]) oncologist--- dr Benay Spice   dx 02/ 2018,  invasive SCC , completed chemo/ radiation 12-23-2016;  recurrent metastatsis retroperitoneal lymphdenopathy,  completed radiation 04-13-2020 residual right ureteral uropathy obstruction   Retroperitoneal lymphadenopathy    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   Sepsis due to Klebsiella pneumoniae ([place]) 01/07/2021   pt admitted to [facility] in [place],  dx sepsis secondary to acute pyelonephritis with bacteremia , positive blood culture,  discharged 01-11-2021 home daily IV antibiotic   Wears glasses     Past Surgical History:  Past Surgical History:  Procedure Laterality Date   BUNIONECTOMY Right yrs ago   [place] Right 06/12/2020   Procedure: CYSTOSCOPY WITH RETROGRADE PYELOGRAM/URETERAL STENT EXCHANGE;  Surgeon: Alexis Frock, MD;  Location: [facility];  Service: Urology;  Laterality: Right;   CYSTOSCOPY W/ URETERAL STENT PLACEMENT Right 01/13/2021   Procedure: CYSTOSCOPY WITH RETROGRADE PYELOGRAM/URETERAL STENT REMOVALRIGHT;  Surgeon: Alexis Frock, MD;  Location: [facility];  Service: Urology;  Laterality: Right;  [place], URETEROSCOPY AND STENT PLACEMENT Right 01/28/2020   Procedure: CYSTOSCOPY WITH RETROGRADE PYELOGRAM, URETEROSCOPY AND STENT PLACEMENT;  Surgeon:  Alexis Frock, MD;  Location: [facility];  Service: Urology;  Laterality: Right;   IR GENERIC HISTORICAL  11/14/2016   IR US GUIDE VASC ACCESS RIGHT 11/14/2016 [facility]   IR GENERIC HISTORICAL  11/14/2016   IR FLUORO GUIDE CV LINE RIGHT 11/14/2016 [facility]   IR  GENERIC HISTORICAL  12/12/2016   IR US GUIDE VASC ACCESS RIGHT 12/12/2016 [name], MD [facility]   IR GENERIC HISTORICAL  12/12/2016   IR FLUORO GUIDE CV LINE RIGHT 12/12/2016 [name], MD [facility]   SKIN CANCER EXCISION  1990   bowens disease    Medication: Current Facility-Administered Medications  Medication Dose Route Frequency Provider Last Rate Last Admin   acetaminophen (TYLENOL) suppository 650 mg  650 mg Rectal Q6H PRN Nicoletta Dress, Na, MD       acetaminophen (TYLENOL) tablet 1,000 mg  1,000 mg Oral Q8H PRN Nicoletta Dress, Na, MD       cefTRIAXone (ROCEPHIN) 1 g in sodium chloride 0.9 % 100 mL IVPB  1 g Intravenous Once Nicoletta Dress, Na, MD       [START ON 05/17/2021] cefTRIAXone (ROCEPHIN) 2 g in sodium chloride 0.9 % 100 mL IVPB  2 g Intravenous Q24H Nicoletta Dress, Na, MD       HYDROmorphone (DILAUDID) tablet 2 mg  2 mg Oral Q3H PRN Nicoletta Dress, Na, MD   2 mg at 05/16/21 1527   iohexol (OMNIPAQUE) 9 MG/ML oral solution 500 mL  500 mL Oral BID PRN Nicoletta Dress, Na, MD   500 mL at 05/15/21 2359   levothyroxine (SYNTHROID) tablet 50 mcg  50 mcg Oral Daily Li, Na, MD       LORazepam (ATIVAN) tablet 0.5 mg  0.5 mg Oral Q12H PRN Nicoletta Dress, Na, MD       mirabegron ER (MYRBETRIQ) tablet 50 mg  50 mg Oral Daily Li, Na, MD       ondansetron (ZOFRAN) injection 4 mg  4 mg Intravenous Q6H Li, Na, MD       ondansetron (ZOFRAN) tablet 4 mg  4 mg Oral Q6H PRN Nicoletta Dress, Na, MD       Or   ondansetron (ZOFRAN) injection 4 mg  4 mg Intravenous Q6H PRN Nicoletta Dress, Na, MD       oxyCODONE-acetaminophen (PERCOCET/ROXICET) 5-325 MG per tablet 1 tablet  1 tablet Oral Q6H PRN Li, Na, MD       polyethylene glycol (MIRALAX / GLYCOLAX) packet 17 g  17 g Oral Daily Li, Na, MD       traMADol (ULTRAM) tablet 50 mg  50 mg Oral Q6H PRN Charlann Lange, MD        Allergies: Allergies  Allergen Reactions   Benadryl [Diphenhydramine] Other (See Comments)    Tingle all over    Citrus Diarrhea   Melatonin     Tingle all over    Other Diarrhea    greenbeans and citrus   Lemon Oil Diarrhea    Penicillins Rash    Did it involve swelling of the face/tongue/throat, SOB, or low BP? N Did it involve sudden or severe rash/hives, skin peeling, or any reaction on the inside of your mouth or nose? Y Did you need to seek medical attention at a hospital or doctor's office? N When did it last happen? Almost 50 years Ago      If all above answers are "NO", may proceed with cephalosporin use.     Social History: Social History   Tobacco Use   Smoking status: Former  Packs/day: 1.00    Years: 25.00    Pack years: 25.00    Types: Cigarettes    Quit date: 06/20/1995    Years since quitting: 25.9   Smokeless tobacco: Never  Vaping Use   Vaping Use: Never used  Substance Use Topics   Alcohol use: Not Currently    Alcohol/week: 0.0 standard drinks    Comment: occasional wine   Drug use: Yes    Types: Marijuana    Comment: 01-12-2021  per pt once a week    Family History Family History  Problem Relation Age of Onset   Lung cancer Mother        lung   Breast cancer Mother 17   Ovarian cancer Mother 70   Pancreatitis Father    Breast cancer Sister 39   Breast cancer Maternal Grandmother 32       breast    Breast cancer Maternal Aunt 75       breast   Melanoma Sister    Breast cancer Sister 55   Non-Hodgkin's lymphoma Paternal Grandmother     Review of Systems 10 systems were reviewed and are negative except as noted specifically in the HPI.  Objective   Vital signs in last 24 hours: BP 128/67 (BP Location: Left Arm)   Pulse 93   Temp 98.4 F (36.9 C) (Oral)   Resp 17   Ht '5\' 2"'$  (1.575 m)   Wt 74.6 kg   SpO2 100%   BMI 30.09 kg/m   Physical Exam General: NAD, A&O, resting, appropriate HEENT: [place]/AT, EOMI, MMM Pulmonary: Normal work of breathing Cardiovascular: HDS, adequate peripheral perfusion Abdomen: Soft, some moderate RLQ pain otherwise NTTP, nondistended. GU: Voiding spontaneously, No CVA tenderness Extremities: warm and well perfused Neuro:  Appropriate, no focal neurological deficits  Most Recent Labs: Lab Results  Component Value Date   WBC 8.7 05/15/2021   HGB 10.6 (L) 05/15/2021   HCT 32.6 (L) 05/15/2021   PLT 173 05/15/2021    Lab Results  Component Value Date   NA 134 (L) 05/15/2021   K 3.7 05/15/2021   CL 101 05/15/2021   CO2 21 (L) 05/15/2021   BUN 18 05/15/2021   CREATININE 0.73 05/15/2021   CALCIUM 9.6 05/15/2021   MG 2.3 01/27/2021   PHOS 3.3 01/19/2021    Lab Results  Component Value Date   INR 1.1 02/26/2021   APTT 33 01/13/2021    IMAGING: CT ABDOMEN PELVIS W CONTRAST  Result Date: 05/16/2021 CLINICAL DATA:  Left lower quadrant abdominal pain. Metastatic anal cancer. EXAM: CT ABDOMEN AND PELVIS WITH CONTRAST TECHNIQUE: Multidetector CT imaging of the abdomen and pelvis was performed using the standard protocol following bolus administration of intravenous contrast. CONTRAST:  25m OMNIPAQUE IOHEXOL 350 MG/ML SOLN COMPARISON:  CT of the chest abdomen pelvis dated 05/11/2021. FINDINGS: Lower chest: The visualized lung bases are clear. No intra-abdominal free air.  Trace free fluid in the pelvis Hepatobiliary: Subcentimeter left hepatic hypodense focus is too small to characterize. No intrahepatic biliary ductal dilatation. The gallbladder is unremarkable. Pancreas: The pancreas is atrophic and poorly visualized. No active inflammatory changes. Spleen: Choose wall Adrenals/Urinary Tract: The adrenal glands are unremarkable. There is severe right hydronephrosis with right renal parenchyma atrophy. There is obstruction of the proximal right ureter secondary to retroperitoneal soft tissue mass implanted in the right psoas muscle. The left kidney is unremarkable. There is thickened appearance of the urinary bladder as seen on the prior CT. Stomach/Bowel: Perirectal stranding and  circumferential thickening of the rectal wall as seen previously and may be related to post radiation changes. There is sigmoid  diverticulosis without active inflammatory changes. There is moderate stool throughout the colon. There is no bowel obstruction. The appendix is normal. Vascular/Lymphatic: Mild aortoiliac atherosclerotic disease. There is compression of the IVC secondary to a 4.4 x 2.4 cm (36/2) retroperitoneal mass/adenopathy on the psoas muscle. No portal venous gas. Reproductive: The uterus is grossly unremarkable. No adnexal masses. Other: None Musculoskeletal: None degenerative changes of the spine and hips. No acute osseous pathology. IMPRESSION: 1. Severe right hydronephrosis with right renal parenchyma atrophy similar to prior CT and secondary to obstruction of the proximal right ureter by retroperitoneal soft tissue mass/adenopathy on the right psoas muscle. 2. Thickened appearance of the urinary bladder and rectum as seen previously and may be related to post radiation changes. 3. Sigmoid diverticulosis. No bowel obstruction. Normal appendix. 4. Aortic Atherosclerosis (ICD10-I70.0). Electronically Signed   By: Anner Crete M.D.   On: 05/16/2021 01:35   DG Abdomen Acute W/Chest  Result Date: 05/15/2021 CLINICAL DATA:  Abdominal pain, constipation EXAM: DG ABDOMEN ACUTE WITH 1 VIEW CHEST COMPARISON:  CT chest abdomen pelvis dated 05/12/2021 FINDINGS: Increased interstitial markings. Known small pulmonary nodules are better evaluated on recent CT. No focal consolidation. No pleural effusion or pneumothorax. The heart is normal in size. Nonobstructive bowel gas pattern.  Normal colonic stool burden. No evidence of free air under the diaphragm on the upright view. Degenerative changes of the visualized thoracolumbar spine. IMPRESSION: No evidence of acute cardiopulmonary disease. No evidence of small bowel obstruction or free air. Normal colonic stool burden. Electronically Signed   By: Julian Hy M.D.   On: 05/15/2021 23:14    ------  Assessment:  70 y.o. female with Hx of metastatic rectal cancer  currently on chemotherapy c/b right malignant ureteral obstruction w/ hydronephrosis and incidentally found UTI.  Overall the patient's primary complaints seem to have been relieved with the relief of her constipation. She is otherwise AF, HDS. Labwork is overall reassuring (WBC 8.7, Cr 0.73). She does appear to have UTI per urinalysis, urine culture is pending.   Discussed options with the patient including conservative management with antibiotics for UTI or more aggressive intervention w/ urinary tract decompression with ureteral stent versus nephrostomy tube. Discussed the risks of not decompressing the right collecting system in the setting of a UTI, including the progression to sepsis. Given the stability of the patient and lack of concerning clinic symptoms at this point, we collectively decided to manage conservatively with antibiotics for her UTI.   It does appear she has recurrent right maligant obstruction of her kidney; however, she appears to be asymptomatic from this (NO CVA tenderness) and her Cr is at her baseline. If her Cr begins to increase or she becomes symptomatic from this obstruction in the future (right flank pain) then a discussion with her would be needed regarding need for urinary tract decompression.   Recommendations: - No acute urologic intervention warranted - Recommend empiric antibiotics for UTI w/ narrowing based off culture results. Recommended duration of [id number] days for complicated UTI - If patient clinically worsens with hemodynamic instability or fevers, please page the Urology consult pager as patient may need urinary tract decompression - Recommend continued monitoring of patient's Cr and symptoms. If symptomatic obstruction or rising Cr becomes present, then patient may benefit from repeat right ureteral stent placement versus nephrostomy tube placement.    Thank you for this consult.  Please contact the urology consult pager with any further  questions/concerns.  I have seen and examined the patient and agree with the above assessment and plan.  Malignant right ureteral obstruction now with evidence of right kidney atrophy: Seen on CT A/P 05/16/2021. Previously managed with indwelling right ureteral stents, poorly tolerated, right stent removed in 12/2020 Creatine stable at 0.73 on 05/15/21 2. UTI: UA with positive nitrites, Ucx pending  -I discussed options with her including observation, right ureteral stent or right nephrostomy tube. Presently her pain is well controlled. She did not tolerate stents well in the past and would like to avoid that option. She is also not currently in favor of a nephrostomy tube. -I did discuss evidence of atrophy of her right kidney and that her overall renal function may decline. I also discussed risk of developing pyelonephritis ascending UTI and obstruction of her right kidney. -Please call us back if she would like to proceed with stent or nephrostomy tube.  [facility]  Pager: [phone]

## 2021-05-17 ENCOUNTER — Telehealth: Payer: Self-pay

## 2021-05-17 ENCOUNTER — Telehealth: Payer: Self-pay | Admitting: Internal Medicine

## 2021-05-17 LAB — CBC
HCT: 30.7 % — ABNORMAL LOW (ref 36.0–46.0)
Hemoglobin: 9.7 g/dL — ABNORMAL LOW (ref 12.0–15.0)
MCH: 28.6 pg (ref 26.0–34.0)
MCHC: 31.6 g/dL (ref 30.0–36.0)
MCV: 90.6 fL (ref 80.0–100.0)
Platelets: 141 10*3/uL — ABNORMAL LOW (ref 150–400)
RBC: 3.39 MIL/uL — ABNORMAL LOW (ref 3.87–5.11)
RDW: 19 % — ABNORMAL HIGH (ref 11.5–15.5)
WBC: 4.6 10*3/uL (ref 4.0–10.5)
nRBC: 0 % (ref 0.0–0.2)

## 2021-05-17 LAB — BASIC METABOLIC PANEL
Anion gap: 6 (ref 5–15)
BUN: 15 mg/dL (ref 8–23)
CO2: 24 mmol/L (ref 22–32)
Calcium: 9.1 mg/dL (ref 8.9–10.3)
Chloride: 103 mmol/L (ref 98–111)
Creatinine, Ser: 0.79 mg/dL (ref 0.44–1.00)
GFR, Estimated: >60 mL/min (ref >60)
Glucose, Bld: 99 mg/dL (ref 70–99)
Potassium: 4.3 mmol/L (ref 3.5–5.1)
Sodium: 133 mmol/L — ABNORMAL LOW (ref 135–145)

## 2021-05-17 NOTE — Telephone Encounter (Signed)
Patient called requesting a referral to an  urologist.

## 2021-05-17 NOTE — Evaluation (Signed)
Physical Therapy Evaluation Patient Details Name: Ashley Moore MRN: [id number] DOB: [birth year] Today's Date: 05/17/2021   History of Present Illness  Patient is a 70 y.o. female who presented with nausea and abdominal pain. Patient reports that her last chemotherapy was 1 week ago.  She started to have nausea and abdominal pain last night.  Her abdomen pain was located to the entire abdomen, sharp pain, constant, severe pain with no radiation. Abdomen CT showed "Severe right hydronephrosis with right renal parenchyma atrophy similar to prior CT and secondary to obstruction of the proximal right ureter by retroperitoneal soft tissue mass/adenopathy on the right psoas muscle". PMH significant of metastatic anal cancer under current chemotherapy, right kidney hydronephrosis, chronic low back pain, chronic radiation cystitis, hypothyroidism, lymphedema.    Clinical Impression  Ashley Moore is 70 y.o. female admitted with above HPI and diagnosis. Patient is currently limited by functional impairments below (see PT problem list). Patient lives alone and is independent at baseline and has assist from family available. Patient evaluated by Physical Therapy with no further acute PT needs identified. She completed transfers, gait, and high level balance while showering at supervision/mod Independent level. All education has been completed and the patient has no further questions. See below for any follow-up Physical Therapy or equipment needs. PT is signing off. Thank you for this referral.     Follow Up Recommendations No PT follow up    Equipment Recommendations  None recommended by PT    Recommendations for Other Services       Precautions / Restrictions Precautions Precautions: Fall Restrictions Weight Bearing Restrictions: No      Mobility  Bed Mobility Overal bed mobility: Independent                  Transfers Overall transfer level: Modified independent Equipment  used: None             General transfer comment: pt taking extra time to rise but steady.  Ambulation/Gait Ambulation/Gait assistance: Supervision;Modified independent (Device/Increase time) Gait Distance (Feet): 360 Feet Assistive device: IV Pole;None Gait Pattern/deviations: Step-through pattern;Decreased stride length;Trunk flexed Gait velocity: decr   General Gait Details: pt with no overt LOB noted. leaning on IV pole with flexed posture and more upright with no device. pt ambulating in high guard position.  Stairs            Wheelchair Mobility    Modified Rankin (Stroke Patients Only)       Balance Overall balance assessment: Needs assistance Sitting-balance support: Feet supported Sitting balance-Leahy Scale: Good     Standing balance support: During functional activity;Bilateral upper extremity supported Standing balance-Leahy Scale: Good Standing balance comment: pt able to step in/out of shower with use of grab bar. pt able to wash up in shower.                             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 used to work  for [employer].     Hand Dominance   Dominant Hand: Right    Extremity/Trunk Assessment   Upper Extremity Assessment Upper Extremity Assessment: Overall WFL for tasks assessed    Lower Extremity Assessment Lower Extremity Assessment: Overall WFL for tasks assessed    Cervical / Trunk Assessment Cervical / Trunk Assessment: Normal   Communication   Communication: No difficulties  Cognition Arousal/Alertness: Awake/alert Behavior During Therapy: WFL for tasks assessed/performed Overall Cognitive Status: Within Functional Limits for tasks assessed                                        General Comments      Exercises     Assessment/Plan    PT Assessment Patent does not need any further PT services  PT Problem List         PT Treatment Interventions      PT Goals (Current goals can be found in the Care Plan section)  Acute Rehab PT Goals Patient Stated Goal: return home PT Goal Formulation: All assessment and education complete, DC therapy Time For Goal Achievement: 05/24/21 Potential to Achieve Goals: Good    Frequency     Barriers to discharge        Co-evaluation               AM-PAC PT "6 Clicks" Mobility  Outcome Measure Help needed turning from your back to your side while in a flat bed without using bedrails?: None Help needed moving from lying on your back to sitting on the side of a flat bed without using bedrails?: None Help needed moving to and from a bed to a chair (including a wheelchair)?: None Help needed standing up from a chair using your arms (e.g., wheelchair or bedside chair)?: None Help needed to walk in hospital room?: A Little Help needed climbing 3-5 steps with a railing? : A Little 6 Click Score: 22    End of Session   Activity Tolerance: Patient tolerated treatment well Patient left: in bed;with call bell/phone within reach;with nursing/sitter in room Nurse Communication: Mobility status PT Visit Diagnosis: Unsteadiness on feet (R26.81);Muscle weakness (generalized) (M62.81)    Time: 1810-1849 PT Time Calculation (min) (ACUTE ONLY): 39 min   Charges:   PT Evaluation $PT Eval Low Complexity: 1 Low PT Treatments $Gait Training: 8-22 mins $Therapeutic Activity: 8-22 mins        Verner Mould, DPT Acute Rehabilitation Services Office  [phone] Pager [phone]   [name] 05/17/2021, 7:23 PM

## 2021-05-17 NOTE — Progress Notes (Signed)
Patient ID: Ashley Moore, female   DOB: March 15, 1951, 70 y.o.   MRN: [id number]  PROGRESS NOTE    Ashley Moore  [id number] DOB: 1951-08-15 DOA: 05/15/2021 PCP: [name], MD   Brief Narrative:  70 y.o. female with medical history significant of metastatic anal cancer under current chemotherapy, right kidney hydronephrosis, chronic low back pain, chronic radiation cystitis, hypothyroidism presented with nausea and abdominal pain.  On presentation, UA showed pyuria; negative COVID and influenza.  Abdominal CT showed severe right hydronephrosis with right renal parenchyma atrophy similar to prior CT and secondary to obstruction of the proximal right ureter by retroperitoneal soft tissue mass/adenopathy on the right psoas muscle.  Urology was consulted.  She was started on IV antibiotics.  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  Patient has history of right ureteral stent placement and removal in the past. -Urology evaluation appreciated: Recommend empiric antibiotics for complicated UTI for 10 to 14 days.  If patient clinically worsens with hemodynamic instability or fevers, patient will need urology reevaluation for possible need for urinary tract decompression -Apparently patient did not tolerate stents well in the past and would like to avoid that option; currently not in favor of nephrostomy tube as well as per urology. -Currently MRI negative stable with no temperature spikes.  No elevated WBCs.  Continue Rocephin for 1 more day.  Follow cultures. -Will need outpatient follow-up with urology.  Metastatic anal cancer currently undergoing chemotherapy -Follows up with Dr. Benay Spice  Chronic back pain -Continue current pain regimen.  Outpatient follow-up with PCP/pain management  Hyponatremia -Mild.  Monitor  next  Hypothyroidism -Continue levothyroxine  Thrombocytopenia -Questionable cause.  Monitor  Anemia of chronic disease -Possibly from cancer.  Hemoglobin stable.  Monitor  Obesity -Outpatient follow-up  DVT prophylaxis: SCDs Code Status: Full Family Communication: None at bedside Disposition Plan: Status is: Inpatient  Remains inpatient appropriate because:Inpatient level of care appropriate due to severity of illness  Dispo: The patient is from: Home              Anticipated d/c is to: Home              Patient currently is not medically stable to d/c.   Difficult to place patient No  Consultants: Urology  Procedures: None  Antimicrobials: Rocephin from 05/16/2021 onwards   Subjective: Patient seen and examined at bedside.  He is slightly better but still complains of intermittent nausea and back pain.  No overnight fever or vomiting reported.  Objective: Vitals:   05/16/21 2142 05/17/21 0147 05/17/21 0600 05/17/21 0937  BP: 127/68 128/69 139/78 (!) 122/103  Pulse: (!) 103 89 87 (!) 102  Resp: '15 16 18 17  '$ Temp: 98.8 F (37.1 C) 98.4 F (36.9 C) 98.3 F (36.8 C) 98.2 F (36.8 C)  TempSrc: Oral Oral Oral Oral  SpO2: 98% 97% 97% 100%  Weight:      Height:        Intake/Output Summary (Last 24 hours) at 05/17/2021 1112 Last data filed at 05/17/2021 1000 Gross per 24 hour  Intake 700 ml  Output 1501 ml  Net -801 ml   Filed Weights   05/16/21 0954  Weight: 74.6 kg    Examination:  General exam: Appears calm and comfortable.  Currently on room air.  Looks chronically ill and deconditioned. Respiratory system: Bilateral decreased breath sounds  at bases with some scattered crackles Cardiovascular system: S1 & S2 heard, intermittently tachycardic gastrointestinal system: Abdomen is nondistended, soft and nontender. Normal bowel sounds heard. Extremities: No cyanosis, clubbing, edema  Central nervous system: Alert and oriented. No focal neurological  deficits. Moving extremities Skin: No rashes, lesions or ulcers Psychiatry: Affect is mostly flat.   Data Reviewed: I have personally reviewed following labs and imaging studies  CBC: Recent Labs  Lab 05/11/21 0819 05/15/21 2230 05/17/21 0747  WBC 5.8 8.7 4.6  NEUTROABS 4.8 8.3*  --   HGB 10.1* 10.6* 9.7*  HCT 32.4* 32.6* 30.7*  MCV 90.0 86.9 90.6  PLT 209 173 [id number]*   Basic Metabolic Panel: Recent Labs  Lab 05/11/21 0819 05/15/21 2230 05/17/21 0747  NA 137 134* 133*  K 4.1 3.7 4.3  CL 103 101 103  CO2 26 21* 24  GLUCOSE 87 101* 99  BUN '22 18 15  '$ CREATININE 0.91 0.73 0.79  CALCIUM 9.5 9.6 9.1   GFR: Estimated Creatinine Clearance: 61 mL/min (by C-G formula based on SCr of 0.79 mg/dL). Liver Function Tests: Recent Labs  Lab 05/11/21 0819 05/15/21 2230  AST 19 26  ALT 9 13  ALKPHOS 76 64  BILITOT 0.3 0.6  PROT 7.4 7.2  ALBUMIN 3.9 3.7   No results for input(s): LIPASE, AMYLASE in the last 168 hours. No results for input(s): AMMONIA in the last 168 hours. Coagulation Profile: Recent Labs  Lab 05/16/21 1725  INR 1.2   Cardiac Enzymes: No results for input(s): CKTOTAL, CKMB, CKMBINDEX, TROPONINI in the last 168 hours. BNP (last 3 results) No results for input(s): PROBNP in the last 8760 hours. HbA1C: No results for input(s): HGBA1C in the last 72 hours. CBG: No results for input(s): GLUCAP in the last 168 hours. Lipid Profile: No results for input(s): CHOL, HDL, LDLCALC, TRIG, CHOLHDL, LDLDIRECT in the last 72 hours. Thyroid Function Tests: No results for input(s): TSH, T4TOTAL, FREET4, T3FREE, THYROIDAB in the last 72 hours. Anemia Panel: No results for input(s): VITAMINB12, FOLATE, FERRITIN, TIBC, IRON, RETICCTPCT in the last 72 hours. Sepsis Labs: No results for input(s): PROCALCITON, LATICACIDVEN in the last 168 hours.  Recent Results (from the past 240 hour(s))  Resp Panel by RT-PCR (Flu A&B, Covid) Nasopharyngeal Swab     Status: None    Collection Time: 05/15/21 11:02 PM   Specimen: Nasopharyngeal Swab; Nasopharyngeal(NP) swabs in vial transport medium  Result Value Ref Range Status   SARS Coronavirus 2 by RT PCR NEGATIVE NEGATIVE Final    Comment: (NOTE) SARS-CoV-2 target nucleic acids are NOT DETECTED.  The SARS-CoV-2 RNA is generally detectable in upper respiratory specimens during the acute phase of infection. The lowest concentration of SARS-CoV-2 viral copies this assay can detect is 138 copies/mL. A negative result does not preclude SARS-Cov-2 infection and should not be used as the sole basis for treatment or other patient management decisions. A negative result may occur with  improper specimen collection/handling, submission of specimen other than nasopharyngeal swab, presence of viral mutation(s) within the areas targeted by this assay, and inadequate number of viral copies(<138 copies/mL). A negative result must be combined with clinical observations, patient history, and epidemiological information. The expected result is Negative.  Fact Sheet for Patients:  EntrepreneurPulse.com.au  Fact Sheet for Healthcare Providers:  IncredibleEmployment.be  This test is no t yet approved or cleared by the [place] FDA and  has been authorized for detection and/or diagnosis of SARS-CoV-2 by FDA under an Emergency Use Authorization (  EUA). This EUA will remain  in effect (meaning this test can be used) for the duration of the COVID-19 declaration under Section 564(b)(1) of the Act, 21 U.S.C.section 360bbb-3(b)(1), unless the authorization is terminated  or revoked sooner.       Influenza A by PCR NEGATIVE NEGATIVE Final   Influenza B by PCR NEGATIVE NEGATIVE Final    Comment: (NOTE) The Xpert Xpress SARS-CoV-2/FLU/RSV plus assay is intended as an aid in the diagnosis of influenza from Nasopharyngeal swab specimens and should not be used as a sole basis for treatment.  Nasal washings and aspirates are unacceptable for Xpert Xpress SARS-CoV-2/FLU/RSV testing.  Fact Sheet for Patients: EntrepreneurPulse.com.au  Fact Sheet for Healthcare Providers: IncredibleEmployment.be  This test is not yet approved or cleared by the [place] FDA and has been authorized for detection and/or diagnosis of SARS-CoV-2 by FDA under an Emergency Use Authorization (EUA). This EUA will remain in effect (meaning this test can be used) for the duration of the COVID-19 declaration under Section 564(b)(1) of the Act, 21 U.S.C. section 360bbb-3(b)(1), unless the authorization is terminated or revoked.  Performed at [facility], [address] [place], [place] [address]   Culture, blood (Routine X 2) w Reflex to ID Panel     Status: None (Preliminary result)   Collection Time: 05/16/21  5:25 PM   Specimen: BLOOD  Result Value Ref Range Status   Specimen Description   Final    BLOOD BLOOD RIGHT HAND Performed at [facility] [address], [place], [place] [address]    Special Requests   Final    BOTTLES DRAWN AEROBIC AND ANAEROBIC Blood Culture adequate volume Performed at [facility] [address], [place], [place] [address]    Culture   Final    NO GROWTH < 12 HOURS Performed at [facility] [address], [place], [place] [address]    Report Status PENDING  Incomplete  Culture, blood (Routine X 2) w Reflex to ID Panel     Status: None (Preliminary result)   Collection Time: 05/16/21  5:25 PM   Specimen: BLOOD  Result Value Ref Range Status   Specimen Description   Final    BLOOD RIGHT ANTECUBITAL Performed at [facility] [address], [place], [place] [address]    Special Requests   Final    BOTTLES DRAWN AEROBIC AND ANAEROBIC Blood Culture adequate volume Performed at [facility] [address]  [place], [place] [address]    Culture   Final    NO GROWTH < 12 HOURS Performed at [facility] [address] [place],  [address]    Report Status PENDING  Incomplete         Radiology Studies: CT ABDOMEN PELVIS W CONTRAST  Result Date: 05/16/2021 CLINICAL DATA:  Left lower quadrant abdominal pain. Metastatic anal cancer. EXAM: CT ABDOMEN AND PELVIS WITH CONTRAST TECHNIQUE: Multidetector CT imaging of the abdomen and pelvis was performed using the standard protocol following bolus administration of intravenous contrast. CONTRAST:  21m OMNIPAQUE IOHEXOL 350 MG/ML SOLN COMPARISON:  CT of the chest abdomen pelvis dated 05/11/2021. FINDINGS: Lower chest: The visualized lung bases are clear. No intra-abdominal free air.  Trace free fluid in the pelvis Hepatobiliary: Subcentimeter left hepatic hypodense focus is too small to characterize. No intrahepatic biliary ductal dilatation. The gallbladder is unremarkable. Pancreas: The pancreas is atrophic and poorly visualized. No active inflammatory changes. Spleen: Choose  wall Adrenals/Urinary Tract: The adrenal glands are unremarkable. There is severe right hydronephrosis with right renal parenchyma atrophy. There is obstruction of the proximal right ureter secondary to retroperitoneal soft tissue mass implanted in the right psoas muscle. The left kidney is unremarkable. There is thickened appearance of the urinary bladder as seen on the prior CT. Stomach/Bowel: Perirectal stranding and circumferential thickening of the rectal wall as seen previously and may be related to post radiation changes. There is sigmoid diverticulosis without active inflammatory changes. There is moderate stool throughout the colon. There is no bowel obstruction. The appendix is normal. Vascular/Lymphatic: Mild aortoiliac atherosclerotic disease. There is compression of the IVC secondary to a 4.4 x 2.4 cm (36/2) retroperitoneal mass/adenopathy on the psoas muscle. No portal  venous gas. Reproductive: The uterus is grossly unremarkable. No adnexal masses. Other: None Musculoskeletal: None degenerative changes of the spine and hips. No acute osseous pathology. IMPRESSION: 1. Severe right hydronephrosis with right renal parenchyma atrophy similar to prior CT and secondary to obstruction of the proximal right ureter by retroperitoneal soft tissue mass/adenopathy on the right psoas muscle. 2. Thickened appearance of the urinary bladder and rectum as seen previously and may be related to post radiation changes. 3. Sigmoid diverticulosis. No bowel obstruction. Normal appendix. 4. Aortic Atherosclerosis (ICD10-I70.0). Electronically Signed   By: Anner Crete M.D.   On: 05/16/2021 01:35   DG Abdomen Acute W/Chest  Result Date: 05/15/2021 CLINICAL DATA:  Abdominal pain, constipation EXAM: DG ABDOMEN ACUTE WITH 1 VIEW CHEST COMPARISON:  CT chest abdomen pelvis dated 05/12/2021 FINDINGS: Increased interstitial markings. Known small pulmonary nodules are better evaluated on recent CT. No focal consolidation. No pleural effusion or pneumothorax. The heart is normal in size. Nonobstructive bowel gas pattern.  Normal colonic stool burden. No evidence of free air under the diaphragm on the upright view. Degenerative changes of the visualized thoracolumbar spine. IMPRESSION: No evidence of acute cardiopulmonary disease. No evidence of small bowel obstruction or free air. Normal colonic stool burden. Electronically Signed   By: Julian Hy M.D.   On: 05/15/2021 23:14        Scheduled Meds:  levothyroxine  50 mcg Oral Daily   mirabegron ER  50 mg Oral Daily   ondansetron (ZOFRAN) IV  4 mg Intravenous Q6H   polyethylene glycol  17 g Oral Daily   Continuous Infusions:  cefTRIAXone (ROCEPHIN)  IV 2 g (05/17/21 1011)          Aline August, MD [facility] 05/17/2021, 11:12 AM

## 2021-05-17 NOTE — Telephone Encounter (Signed)
So this is confusing.  She should not need a referral as the inpatient and urology team should arrange follow-up.  I see that she is still in the hospital and urology.  has consulted .  the inpatient team should arrange a follow-up appointment follow-up as indicated  Please advise /contact patient to ascertain what is actually required

## 2021-05-17 NOTE — Telephone Encounter (Signed)
Pt call and stated she the name and # of the neurologist that dr.panosh gave her and want a call back.

## 2021-05-17 NOTE — Telephone Encounter (Signed)
I spoke with the pt and she stated that she would follow up with care team regarding this referral to Urology.

## 2021-05-17 NOTE — Telephone Encounter (Signed)
Pt to follow up with [facility] care team regarding referral

## 2021-05-17 NOTE — Telephone Encounter (Signed)
Pt stated she would like referral to Urologist because she was admitted to ED on 05/15/2021 and they recommended she follow up urgently with Urologist. 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

## 2021-05-17 NOTE — Telephone Encounter (Signed)
Pt informed of Neurologist contact information

## 2021-05-18 DIAGNOSIS — N10 Acute tubulo-interstitial nephritis: Secondary | ICD-10-CM

## 2021-05-18 DIAGNOSIS — K5900 Constipation, unspecified: Secondary | ICD-10-CM

## 2021-05-18 DIAGNOSIS — R112 Nausea with vomiting, unspecified: Secondary | ICD-10-CM

## 2021-05-18 DIAGNOSIS — N390 Urinary tract infection, site not specified: Secondary | ICD-10-CM

## 2021-05-18 DIAGNOSIS — C210 Malignant neoplasm of anus, unspecified: Secondary | ICD-10-CM

## 2021-05-18 DIAGNOSIS — N138 Other obstructive and reflux uropathy: Secondary | ICD-10-CM

## 2021-05-18 DIAGNOSIS — R109 Unspecified abdominal pain: Secondary | ICD-10-CM

## 2021-05-18 DIAGNOSIS — N1339 Other hydronephrosis: Secondary | ICD-10-CM

## 2021-05-18 MED ORDER — ACETAMINOPHEN 500 MG PO TABS
1000.0000 mg | ORAL_TABLET | Freq: Four times a day (QID) | ORAL | Status: DC | PRN
  Administered 2021-05-18: 1000 mg via ORAL
  Filled 2021-05-18: qty 2

## 2021-05-18 MED ORDER — TRAMADOL HCL 50 MG PO TABS: 50.0000 mg | ORAL_TABLET | Freq: Four times a day (QID) | ORAL | 0 refills | Status: DC | PRN

## 2021-05-18 MED ORDER — CEPHALEXIN 500 MG PO CAPS: 500.0000 mg | ORAL_CAPSULE | Freq: Three times a day (TID) | ORAL | 0 refills | Status: AC

## 2021-05-18 MED ORDER — POLYETHYLENE GLYCOL 3350 17 G PO PACK
17.0000 g | PACK | Freq: Every day | ORAL | Status: DC | PRN
Start: 2021-05-18 — End: 2022-03-24

## 2021-05-18 NOTE — Plan of Care (Signed)
Instructions were reviewed with patient. All questions were answered. Patient was transported to main entrance by wheelchair. ° °

## 2021-05-18 NOTE — Discharge Summary (Signed)
Physician Discharge Summary  Ashley Moore [id number] DOB: 17-Apr-1951 DOA: 05/15/2021  PCP: [name], MD  Admit date: 05/15/2021 Discharge date: 05/18/2021  Admitted From: Home Disposition: Home  Recommendations for Outpatient Follow-up:  Follow up with PCP in 1 week with repeat CBC/BMP Outpatient follow-up with urology Follow up in ED if symptoms worsen or new appear   Home Health: No Equipment/Devices: None  Discharge Condition: Stable CODE STATUS: Full Diet recommendation: Heart healthy  Brief/Interim Summary: 70 y.o. female with medical history significant of metastatic anal cancer under current chemotherapy, right kidney hydronephrosis, chronic low back pain, chronic radiation cystitis, hypothyroidism presented with nausea and abdominal pain.  On presentation, UA showed pyuria; negative COVID and influenza.  Abdominal CT showed severe right hydronephrosis with right renal parenchyma atrophy similar to prior CT and secondary to obstruction of the proximal right ureter by retroperitoneal soft tissue mass/adenopathy on the right psoas muscle.  Urology was consulted.  She was started on IV antibiotics.  Urology recommended conservative management with antibiotics for now with outpatient follow-up with urology.  She has remained hemodynamically stable.  Cultures are negative so far.  She will be discharged home on oral Keflex today.  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  Patient has history of right ureteral stent placement and removal in the past. -Urology evaluation appreciated: Recommend empiric antibiotics for complicated UTI for 10 to 14 days.  If patient clinically worsens with hemodynamic instability or fevers, patient will need urology reevaluation for possible need for urinary tract  decompression -Apparently patient did not tolerate stents well in the past and would like to avoid that option; currently not in favor of nephrostomy tube as well as per urology. -Currently hemodynamically stable with no temperature spikes.  No elevated WBCs.  Currently on Rocephin.  Cultures negative so far. -Discharge patient home today on oral Keflex -Will need outpatient follow-up with urology.   Metastatic anal cancer currently undergoing chemotherapy -Follows up with Dr. Benay Spice   Chronic back pain -Outpatient follow-up with PCP/pain management  Hyponatremia -Mild.  Outpatient follow-up  Hypothyroidism -Continue levothyroxine  Thrombocytopenia -Questionable cause.  Mild.  Outpatient follow-up.  No signs of bleeding.  Anemia of chronic disease -Possibly from cancer.  Hemoglobin stable.  Monitor  Obesity -Outpatient follow-up  Discharge Instructions  Discharge Instructions     Diet - low sodium heart healthy   Complete by: As directed    Increase activity slowly   Complete by: As directed       Allergies as of 05/18/2021       Reactions   Benadryl [diphenhydramine] Other (See Comments)   Tingle all over    Citrus Diarrhea   Melatonin    Tingle all over    Other Diarrhea   greenbeans and citrus   Lemon Oil Diarrhea   Penicillins Rash   Did it involve swelling of the face/tongue/throat, SOB, or low BP? N Did it involve sudden or severe rash/hives, skin peeling, or any reaction on the inside of your mouth or nose? Y Did you need to seek medical attention at a hospital or doctor's office? N When did it last happen? Almost 50 years Ago      If all above answers are "NO", may proceed with cephalosporin use.        Medication List     STOP taking these medications  dexamethasone 2 MG tablet Commonly known as: DECADRON   oxyCODONE-acetaminophen 5-325 MG tablet Commonly known as: PERCOCET/ROXICET       TAKE these medications    acetaminophen 500  MG tablet Commonly known as: TYLENOL Take 1,000 mg by mouth every 8 (eight) hours as needed for moderate pain.   calcium gluconate 500 MG tablet Take 1 tablet by mouth 3 (three) times daily.   cephALEXin 500 MG capsule Commonly known as: KEFLEX Take 1 capsule (500 mg total) by mouth 3 (three) times daily for 10 days.   conjugated estrogens vaginal cream Commonly known as: PREMARIN Place 1 Applicatorful vaginally daily.   CYSTEX URINARY HEALTH PO Take 1 capsule by mouth daily.   diclofenac Sodium 1 % Gel Commonly known as: VOLTAREN Apply 2 g topically daily. To shoulder   levothyroxine 50 MCG tablet Commonly known as: SYNTHROID TAKE 1 TABLET EVERY DAY What changed: when to take this   LORazepam 0.5 MG tablet Commonly known as: ATIVAN Take 1 tablet (0.5 mg total) by mouth every 12 (twelve) hours as needed for anxiety.   Multivitamin Adult Chew Chew 1 capsule by mouth daily.   Myrbetriq 50 MG Tb24 tablet Generic drug: mirabegron ER Take 50 mg by mouth daily.   ondansetron 8 MG tablet Commonly known as: ZOFRAN Take 1 tablet (8 mg total) by mouth every 8 (eight) hours as needed for nausea or vomiting. Start 72 hours after IV chemotherapy treatment   OVER THE COUNTER MEDICATION Place 1 drop into both eyes daily as needed (dry eyes).   OVER THE COUNTER MEDICATION Take 3 tablets by mouth daily. Total Health by Dr. Ardeth Perfect   polyethylene glycol 17 g packet Commonly known as: MiraLax Take 17 g by mouth daily as needed for mild constipation.   prochlorperazine 10 MG tablet Commonly known as: COMPAZINE Take 1 tablet (10 mg total) by mouth every 6 (six) hours as needed for nausea.   traMADol 50 MG tablet Commonly known as: ULTRAM Take 1 tablet (50 mg total) by mouth every 6 (six) hours as needed for moderate pain. What changed: how much to take   Turmeric 500 MG Caps Take 500 mg by mouth daily.   VITAMIN D3 PO Take 2,000 Units by mouth daily.        Follow-up  Information     Panosh, Standley Brooking, MD. Schedule an appointment as soon as possible for a visit in 1 week(s).   Specialties: Internal Medicine, Pediatrics Contact information: [place] [address] [phone]         Janith Lima, MD. Schedule an appointment as soon as possible for a visit in 1 week(s).   Specialty: Urology Contact information: [address]  [place] [address] [phone]                Allergies  Allergen Reactions   Benadryl [Diphenhydramine] Other (See Comments)    Clayton Lefort all over    Citrus Diarrhea   Melatonin     Tingle all over    Other Diarrhea    greenbeans and citrus   Lemon Oil Diarrhea   Penicillins Rash    Did it involve swelling of the face/tongue/throat, SOB, or low BP? N Did it involve sudden or severe rash/hives, skin peeling, or any reaction on the inside of your mouth or nose? Y Did you need to seek medical attention at a hospital or doctor's office? N When did it last happen? Almost 50 years Ago  If all above answers are "NO", may proceed with cephalosporin use.     Consultations: Urology   Procedures/Studies: CT ABDOMEN PELVIS W CONTRAST  Result Date: 05/16/2021 CLINICAL DATA:  Left lower quadrant abdominal pain. Metastatic anal cancer. EXAM: CT ABDOMEN AND PELVIS WITH CONTRAST TECHNIQUE: Multidetector CT imaging of the abdomen and pelvis was performed using the standard protocol following bolus administration of intravenous contrast. CONTRAST:  26m OMNIPAQUE IOHEXOL 350 MG/ML SOLN COMPARISON:  CT of the chest abdomen pelvis dated 05/11/2021. FINDINGS: Lower chest: The visualized lung bases are clear. No intra-abdominal free air.  Trace free fluid in the pelvis Hepatobiliary: Subcentimeter left hepatic hypodense focus is too small to characterize. No intrahepatic biliary ductal dilatation. The gallbladder is unremarkable. Pancreas: The pancreas is atrophic and poorly visualized. No active  inflammatory changes. Spleen: Choose wall Adrenals/Urinary Tract: The adrenal glands are unremarkable. There is severe right hydronephrosis with right renal parenchyma atrophy. There is obstruction of the proximal right ureter secondary to retroperitoneal soft tissue mass implanted in the right psoas muscle. The left kidney is unremarkable. There is thickened appearance of the urinary bladder as seen on the prior CT. Stomach/Bowel: Perirectal stranding and circumferential thickening of the rectal wall as seen previously and may be related to post radiation changes. There is sigmoid diverticulosis without active inflammatory changes. There is moderate stool throughout the colon. There is no bowel obstruction. The appendix is normal. Vascular/Lymphatic: Mild aortoiliac atherosclerotic disease. There is compression of the IVC secondary to a 4.4 x 2.4 cm (36/2) retroperitoneal mass/adenopathy on the psoas muscle. No portal venous gas. Reproductive: The uterus is grossly unremarkable. No adnexal masses. Other: None Musculoskeletal: None degenerative changes of the spine and hips. No acute osseous pathology. IMPRESSION: 1. Severe right hydronephrosis with right renal parenchyma atrophy similar to prior CT and secondary to obstruction of the proximal right ureter by retroperitoneal soft tissue mass/adenopathy on the right psoas muscle. 2. Thickened appearance of the urinary bladder and rectum as seen previously and may be related to post radiation changes. 3. Sigmoid diverticulosis. No bowel obstruction. Normal appendix. 4. Aortic Atherosclerosis (ICD10-I70.0). Electronically Signed   By: AAnner CreteM.D.   On: 05/16/2021 01:35   CT CHEST ABDOMEN PELVIS W CONTRAST  Result Date: 05/12/2021 CLINICAL DATA:  Anal cancer restaging EXAM: CT CHEST, ABDOMEN, AND PELVIS WITH CONTRAST TECHNIQUE: Multidetector CT imaging of the chest, abdomen and pelvis was performed following the standard protocol during bolus  administration of intravenous contrast. CONTRAST:  646mOMNIPAQUE IOHEXOL 350 MG/ML SOLN, additional oral enteric contrast COMPARISON:  PET-CT, 02/12/2021, CT abdomen pelvis, 01/16/2021 FINDINGS: CT CHEST FINDINGS Cardiovascular: Aortic atherosclerosis. Normal heart size. Left coronary artery calcifications. No pericardial effusion. Mediastinum/Nodes: No enlarged mediastinal or hilar lymph nodes. Interval decrease in size of left axillary and subpectoral lymph nodes, previously FDG avid, a previously seen subpectoral node now almost completely resolved, measuring no larger than 3-4 mm (series 2, image 12), and a left axillary node measuring 1.3 x 0.9 cm, previously 1.5 x 1.1 cm (series 2, image 14). Thyroid gland, trachea, and esophagus demonstrate no significant findings. Lungs/Pleura: FDG avid nodule of the lateral segment right middle lobe is diminished in size, 0.4 cm, previously 0.6 cm (series 4, image 70). Additional subpleural nodule of the anterior right middle lobe is slightly enlarged, measuring 0.7 cm, previously 0.4 cm (series 4, image 99). Mild dependent bibasilar scarring. No pleural effusion or pneumothorax. Musculoskeletal: No chest wall mass or suspicious bone lesions identified. CT ABDOMEN PELVIS FINDINGS Hepatobiliary:  No solid liver abnormality is seen. No gallstones, gallbladder wall thickening, or biliary dilatation. Pancreas: Unremarkable. No pancreatic ductal dilatation or surrounding inflammatory changes. Spleen: Normal in size without significant abnormality. Adrenals/Urinary Tract: Adrenal glands are unremarkable. Redemonstrated obstruction of the proximal right ureter by soft tissue nodule detailed below, with severe right hydronephrosis and cortical atrophy. The left kidney is normal. No calculi or hydronephrosis. Unchanged thickening of the urinary bladder. Stomach/Bowel: Stomach is within normal limits. Appendix appears normal. Unchanged circumferential thickening of the low rectum and  perirectal and presacral fat stranding, likely sequelae of local radiation therapy (series 2, image 96). Vascular/Lymphatic: Aortic atherosclerosis. Unchanged enlarged, matted appearing previously FDG avid right iliac lymph nodes or soft tissue nodule, measuring 3.9 x 2.8 cm (series 2, image 68). Reproductive: No mass or other abnormality. Other: No abdominal wall hernia or abnormality. No abdominopelvic ascites. Musculoskeletal: No acute or significant osseous findings. IMPRESSION: 1. FDG avid nodule of the lateral segment right middle lobe is diminished in size, 0.4 cm, previously 0.6 cm. 2. Additional subpleural nodule of the anterior right middle lobe is slightly enlarged, measuring 0.7 cm, previously 0.4 cm. 3. Interval decrease in size of previously FDG avid left axillary and subpectoral lymph nodes. 4. Unchanged enlarged, matted appearing previously FDG avid right iliac lymph nodes or soft tissue nodule. 5. Overall constellation of findings is most consistent with stable to slightly improved metastatic disease, with the exception of enlarged right middle lobe pulmonary nodule as detailed above. 6. Redemonstrated obstruction of the proximal right ureter by soft tissue nodule detailed above, with severe right hydronephrosis and cortical atrophy. 7. Unchanged circumferential thickening of the low rectum and perirectal and presacral fat stranding, likely sequelae of local radiation therapy. 8. Unchanged thickening of the urinary bladder, which may be related to radiation therapy or nonspecific infectious/inflammatory cystitis. 9. Coronary artery disease. Aortic Atherosclerosis (ICD10-I70.0). Electronically Signed   By: Eddie Candle M.D.   On: 05/12/2021 08:47   DG Abdomen Acute W/Chest  Result Date: 05/15/2021 CLINICAL DATA:  Abdominal pain, constipation EXAM: DG ABDOMEN ACUTE WITH 1 VIEW CHEST COMPARISON:  CT chest abdomen pelvis dated 05/12/2021 FINDINGS: Increased interstitial markings. Known small  pulmonary nodules are better evaluated on recent CT. No focal consolidation. No pleural effusion or pneumothorax. The heart is normal in size. Nonobstructive bowel gas pattern.  Normal colonic stool burden. No evidence of free air under the diaphragm on the upright view. Degenerative changes of the visualized thoracolumbar spine. IMPRESSION: No evidence of acute cardiopulmonary disease. No evidence of small bowel obstruction or free air. Normal colonic stool burden. Electronically Signed   By: Julian Hy M.D.   On: 05/15/2021 23:14      Subjective: Patient seen and examined at bedside.  Denies any overnight fever, nausea or vomiting.  Feels okay to go home today.  Discharge Exam: Vitals:   05/17/21 2157 05/18/21 0620  BP: 112/63 140/60  Pulse: 93 71  Resp: 18 18  Temp: 98.9 F (37.2 C) 98.4 F (36.9 C)  SpO2: 100% 98%    General: Pt is alert, awake, not in acute distress.  Currently on room air. Cardiovascular: rate controlled, S1/S2 + Respiratory: bilateral decreased breath sounds at bases Abdominal: Soft, NT, ND, bowel sounds + Extremities: no edema, no cyanosis    The results of significant diagnostics from this hospitalization (including imaging, microbiology, ancillary and laboratory) are listed below for reference.     Microbiology: Recent Results (from the past 240 hour(s))  Resp Panel by RT-PCR (  Flu A&B, Covid) Nasopharyngeal Swab     Status: None   Collection Time: 05/15/21 11:02 PM   Specimen: Nasopharyngeal Swab; Nasopharyngeal(NP) swabs in vial transport medium  Result Value Ref Range Status   SARS Coronavirus 2 by RT PCR NEGATIVE NEGATIVE Final    Comment: (NOTE) SARS-CoV-2 target nucleic acids are NOT DETECTED.  The SARS-CoV-2 RNA is generally detectable in upper respiratory specimens during the acute phase of infection. The lowest concentration of SARS-CoV-2 viral copies this assay can detect is 138 copies/mL. A negative result does not preclude  SARS-Cov-2 infection and should not be used as the sole basis for treatment or other patient management decisions. A negative result may occur with  improper specimen collection/handling, submission of specimen other than nasopharyngeal swab, presence of viral mutation(s) within the areas targeted by this assay, and inadequate number of viral copies(<138 copies/mL). A negative result must be combined with clinical observations, patient history, and epidemiological information. The expected result is Negative.  Fact Sheet for Patients:  EntrepreneurPulse.com.au  Fact Sheet for Healthcare Providers:  IncredibleEmployment.be  This test is no t yet approved or cleared by the [place] FDA and  has been authorized for detection and/or diagnosis of SARS-CoV-2 by FDA under an Emergency Use Authorization (EUA). This EUA will remain  in effect (meaning this test can be used) for the duration of the COVID-19 declaration under Section 564(b)(1) of the Act, 21 U.S.C.section 360bbb-3(b)(1), unless the authorization is terminated  or revoked sooner.       Influenza A by PCR NEGATIVE NEGATIVE Final   Influenza B by PCR NEGATIVE NEGATIVE Final    Comment: (NOTE) The Xpert Xpress SARS-CoV-2/FLU/RSV plus assay is intended as an aid in the diagnosis of influenza from Nasopharyngeal swab specimens and should not be used as a sole basis for treatment. Nasal washings and aspirates are unacceptable for Xpert Xpress SARS-CoV-2/FLU/RSV testing.  Fact Sheet for Patients: EntrepreneurPulse.com.au  Fact Sheet for Healthcare Providers: IncredibleEmployment.be  This test is not yet approved or cleared by the [place] FDA and has been authorized for detection and/or diagnosis of SARS-CoV-2 by FDA under an Emergency Use Authorization (EUA). This EUA will remain in effect (meaning this test can be used) for the duration of  the COVID-19 declaration under Section 564(b)(1) of the Act, 21 U.S.C. section 360bbb-3(b)(1), unless the authorization is terminated or revoked.  Performed at [facility], [address] [place], [place] [address]   Culture, blood (Routine X 2) w Reflex to ID Panel     Status: None (Preliminary result)   Collection Time: 05/16/21  5:25 PM   Specimen: BLOOD  Result Value Ref Range Status   Specimen Description   Final    BLOOD BLOOD RIGHT HAND Performed at [facility] [address] [place], [place] [address]    Special Requests   Final    BOTTLES DRAWN AEROBIC AND ANAEROBIC Blood Culture adequate volume Performed at [facility] [address] [place], [place] [address]    Culture   Final    NO GROWTH 2 DAYS Performed at [facility] [address] [place], [place] [address]    Report Status PENDING  Incomplete  Culture, blood (Routine X 2) w Reflex to ID Panel     Status: None (Preliminary result)   Collection Time: 05/16/21  5:25 PM   Specimen: BLOOD  Result Value Ref Range Status   Specimen Description   Final    BLOOD  RIGHT ANTECUBITAL Performed at [facility] [address] [place], [place] [address]    Special Requests   Final    BOTTLES DRAWN AEROBIC AND ANAEROBIC Blood Culture adequate volume Performed at [facility] [address], [place], [place] [address]    Culture   Final    NO GROWTH 2 DAYS Performed at [facility] [address] [place], [place] [address]    Report Status PENDING  Incomplete     Labs: BNP (last 3 results) No results for input(s): BNP in the last 8760 hours. Basic Metabolic Panel: Recent Labs  Lab 05/15/21 2230 05/17/21 0747  NA 134* 133*  K 3.7 4.3  CL 101 103  CO2 21* 24  GLUCOSE 101* 99  BUN 18 15  CREATININE 0.73 0.79  CALCIUM 9.6 9.1   Liver Function Tests: Recent Labs  Lab 05/15/21 2230  AST  26  ALT 13  ALKPHOS 64  BILITOT 0.6  PROT 7.2  ALBUMIN 3.7   No results for input(s): LIPASE, AMYLASE in the last 168 hours. No results for input(s): AMMONIA in the last 168 hours. CBC: Recent Labs  Lab 05/15/21 2230 05/17/21 0747  WBC 8.7 4.6  NEUTROABS 8.3*  --   HGB 10.6* 9.7*  HCT 32.6* 30.7*  MCV 86.9 90.6  PLT 173 141*   Cardiac Enzymes: No results for input(s): CKTOTAL, CKMB, CKMBINDEX, TROPONINI in the last 168 hours. BNP: Invalid input(s): POCBNP CBG: No results for input(s): GLUCAP in the last 168 hours. D-Dimer No results for input(s): DDIMER in the last 72 hours. Hgb A1c No results for input(s): HGBA1C in the last 72 hours. Lipid Profile No results for input(s): CHOL, HDL, LDLCALC, TRIG, CHOLHDL, LDLDIRECT in the last 72 hours. Thyroid function studies No results for input(s): TSH, T4TOTAL, T3FREE, THYROIDAB in the last 72 hours.  Invalid input(s): FREET3 Anemia work up No results for input(s): VITAMINB12, FOLATE, FERRITIN, TIBC, IRON, RETICCTPCT in the last 72 hours. Urinalysis    Component Value Date/Time   COLORURINE YELLOW 05/16/2021 0157   APPEARANCEUR HAZY (A) 05/16/2021 0157   LABSPEC >1.046 (H) 05/16/2021 0157   LABSPEC 1.010 02/14/2017 1604   PHURINE 7.5 05/16/2021 0157   GLUCOSEU NEGATIVE 05/16/2021 0157   GLUCOSEU Negative 02/14/2017 1604   HGBUR MODERATE (A) 05/16/2021 0157   BILIRUBINUR NEGATIVE 05/16/2021 0157   BILIRUBINUR Negative 01/08/2020 0949   BILIRUBINUR Negative 02/14/2017 1604   KETONESUR TRACE (A) 05/16/2021 0157   PROTEINUR 30 (A) 05/16/2021 0157   UROBILINOGEN 0.2 01/08/2020 0949   UROBILINOGEN 0.2 02/14/2017 1604   NITRITE POSITIVE (A) 05/16/2021 0157   LEUKOCYTESUR LARGE (A) 05/16/2021 0157   LEUKOCYTESUR Small 02/14/2017 1604   Sepsis Labs Invalid input(s): PROCALCITONIN,  WBC,  LACTICIDVEN Microbiology Recent Results (from the past 240 hour(s))  Resp Panel by RT-PCR (Flu A&B, Covid) Nasopharyngeal Swab      Status: None   Collection Time: 05/15/21 11:02 PM   Specimen: Nasopharyngeal Swab; Nasopharyngeal(NP) swabs in vial transport medium  Result Value Ref Range Status   SARS Coronavirus 2 by RT PCR NEGATIVE NEGATIVE Final    Comment: (NOTE) SARS-CoV-2 target nucleic acids are NOT DETECTED.  The SARS-CoV-2 RNA is generally detectable in upper respiratory specimens during the acute phase of infection. The lowest concentration of SARS-CoV-2 viral copies this assay can detect is 138 copies/mL. A negative result does not preclude SARS-Cov-2 infection and should not be used as the sole basis for treatment or other  patient management decisions. A negative result may occur with  improper specimen collection/handling, submission of specimen other than nasopharyngeal swab, presence of viral mutation(s) within the areas targeted by this assay, and inadequate number of viral copies(<138 copies/mL). A negative result must be combined with clinical observations, patient history, and epidemiological information. The expected result is Negative.  Fact Sheet for Patients:  EntrepreneurPulse.com.au  Fact Sheet for Healthcare Providers:  IncredibleEmployment.be  This test is no t yet approved or cleared by the [place] FDA and  has been authorized for detection and/or diagnosis of SARS-CoV-2 by FDA under an Emergency Use Authorization (EUA). This EUA will remain  in effect (meaning this test can be used) for the duration of the COVID-19 declaration under Section 564(b)(1) of the Act, 21 U.S.C.section 360bbb-3(b)(1), unless the authorization is terminated  or revoked sooner.       Influenza A by PCR NEGATIVE NEGATIVE Final   Influenza B by PCR NEGATIVE NEGATIVE Final    Comment: (NOTE) The Xpert Xpress SARS-CoV-2/FLU/RSV plus assay is intended as an aid in the diagnosis of influenza from Nasopharyngeal swab specimens and should not be used as a sole basis  for treatment. Nasal washings and aspirates are unacceptable for Xpert Xpress SARS-CoV-2/FLU/RSV testing.  Fact Sheet for Patients: EntrepreneurPulse.com.au  Fact Sheet for Healthcare Providers: IncredibleEmployment.be  This test is not yet approved or cleared by the [place] FDA and has been authorized for detection and/or diagnosis of SARS-CoV-2 by FDA under an Emergency Use Authorization (EUA). This EUA will remain in effect (meaning this test can be used) for the duration of the COVID-19 declaration under Section 564(b)(1) of the Act, 21 U.S.C. section 360bbb-3(b)(1), unless the authorization is terminated or revoked.  Performed at [facility], [address], [place], [place] [address]   Culture, blood (Routine X 2) w Reflex to ID Panel     Status: None (Preliminary result)   Collection Time: 05/16/21  5:25 PM   Specimen: BLOOD  Result Value Ref Range Status   Specimen Description   Final    BLOOD BLOOD RIGHT HAND Performed at [facility] [address] [place], [place] [address]    Special Requests   Final    BOTTLES DRAWN AEROBIC AND ANAEROBIC Blood Culture adequate volume Performed at [facility] [address], [place], [place] [address]    Culture   Final    NO GROWTH 2 DAYS Performed at [facility] [address], [place], [place] [address]    Report Status PENDING  Incomplete  Culture, blood (Routine X 2) w Reflex to ID Panel     Status: None (Preliminary result)   Collection Time: 05/16/21  5:25 PM   Specimen: BLOOD  Result Value Ref Range Status   Specimen Description   Final    BLOOD RIGHT ANTECUBITAL Performed at [facility] [address] [place], [place] [address]    Special Requests   Final    BOTTLES DRAWN AEROBIC AND ANAEROBIC Blood Culture adequate volume Performed at [facility] [address] [place], [place] [address]    Culture   Final    NO GROWTH 2 DAYS Performed at [facility] [address] [place], [place] [address]    Report Status PENDING  Incomplete     Time coordinating discharge: 35 minutes  SIGNED:   Aline August, MD  [facility] 05/18/2021, 10:03 AM

## 2021-05-18 NOTE — Progress Notes (Addendum)
HEMATOLOGY-ONCOLOGY PROGRESS NOTE  SUBJECTIVE: Ashley Moore is followed by our office for anal cancer.  She has been receiving treatment with Taxol/carboplatin.  Last cycle was given on 05/12/2021.  G-CSF was discontinued with last cycle secondary to poor tolerance.  Her Taxol and carboplatin have been dose reduced.  She had scans prior to her last cycle of chemotherapy which showed overall stable to slightly improved disease.  She was admitted with nausea and abdominal pain.  Scan showed severe right hydronephrosis.  She was seen by urology this admission and treated for possible UTI.  They recommend outpatient follow-up.  She is being transitioned to oral antibiotics today with plan for outpatient urology follow-up  When seen today, she reported that she is feeling well.  She has no specific complaints.  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  Radiation: 11/14/16-12/23/16: 54 Gy to the pelvic nodes, anus, and bilateral groins.  At diagnosis, she had advanced locoregional disease with bilateral inguinal adenopathy, PET positive presacral, left pelvic sidewall, and a lesion of the left labia consistent with a satellite lesion. She was treated with 5FU and radiotherapy and developed significant desquamation of the tissue. She has been seen due to labial agglutination by Dr. Myrene Galas and continues with premarin daily for this. She also is being followed by Dr. Quentin Cornwall in [place] for her anoscopy.    Anal cancer ([place])  11/03/2016 Initial Diagnosis   Anal cancer ([place])   06/27/2019 Imaging   CT C/A/P - No evidence of recurrent or metastatic carcinoma.   Mild diffuse bladder wall thickening consistent with cystitis, likely due to prior radiation therapy. Colonic diverticulosis, without radiographic evidence of diverticulitis. Aortic and coronary artery atherosclerosis.   03/11/2021 -  Chemotherapy    Patient is on Treatment Plan: GASTROESOPHAGEAL CARBOPLATIN  + PACLITAXEL Q21D        PHYSICAL EXAMINATION:  Vitals:   05/17/21 2157 05/18/21 0620  BP: 112/63 140/60  Pulse: 93 71  Resp: 18 18  Temp: 98.9 F (37.2 C) 98.4 F (36.9 C)  SpO2: 100% 98%   Filed Weights   05/16/21 0954  Weight: 74.6 kg    Intake/Output from previous day: 08/29 0701 - 08/30 0700 In: 760 [P.O.:660; IV Piggyback:100] Out: 2300 [Urine:2300]  GENERAL:alert, no distress and comfortable SKIN: skin color, texture, turgor are normal, no rashes or significant lesions EYES: normal, Conjunctiva are pink and non-injected, sclera clear OROPHARYNX:no exudate, no erythema and lips, buccal mucosa, and tongue normal  LUNGS: clear to auscultation and percussion with normal breathing effort HEART: regular rate & rhythm and no murmurs and no lower extremity edema ABDOMEN:abdomen soft, non-tender and normal bowel sounds NEURO: alert & oriented x 3 with fluent speech, no focal motor/sensory deficits  LABORATORY DATA:  I have reviewed the data as listed CMP Latest Ref Rng & Units 05/17/2021 05/15/2021 05/11/2021  Glucose 70 - 99 mg/dL 99 101(H) 87  BUN 8 - 23 mg/dL '15 18 22  '$ Creatinine 0.44 - 1.00 mg/dL 0.79 0.73 0.91  Sodium 135 - 145 mmol/L 133(L) 134(L) 137  Potassium 3.5 - 5.1 mmol/L 4.3 3.7 4.1  Chloride 98 - 111 mmol/L 103 101 103  CO2 22 - 32 mmol/L 24 21(L) 26  Calcium 8.9 - 10.3 mg/dL 9.1 9.6 9.5  Total Protein 6.5 - 8.1 g/dL - 7.2 7.4  Total Bilirubin 0.3 - 1.2 mg/dL - 0.6 0.3  Alkaline Phos 38 - 126 U/L - 64 76  AST 15 - 41  U/L - 26 19  ALT 0 - 44 U/L - 13 9    Lab Results  Component Value Date   WBC 4.6 05/17/2021   HGB 9.7 (L) 05/17/2021   HCT 30.7 (L) 05/17/2021   MCV 90.6 05/17/2021   PLT 141 (L) 05/17/2021   NEUTROABS 8.3 (H) 05/15/2021    CT ABDOMEN PELVIS W CONTRAST  Result Date: 05/16/2021 CLINICAL DATA:  Left lower quadrant abdominal pain. Metastatic anal cancer. EXAM: CT ABDOMEN AND PELVIS WITH CONTRAST TECHNIQUE: Multidetector CT  imaging of the abdomen and pelvis was performed using the standard protocol following bolus administration of intravenous contrast. CONTRAST:  46m OMNIPAQUE IOHEXOL 350 MG/ML SOLN COMPARISON:  CT of the chest abdomen pelvis dated 05/11/2021. FINDINGS: Lower chest: The visualized lung bases are clear. No intra-abdominal free air.  Trace free fluid in the pelvis Hepatobiliary: Subcentimeter left hepatic hypodense focus is too small to characterize. No intrahepatic biliary ductal dilatation. The gallbladder is unremarkable. Pancreas: The pancreas is atrophic and poorly visualized. No active inflammatory changes. Spleen: Choose wall Adrenals/Urinary Tract: The adrenal glands are unremarkable. There is severe right hydronephrosis with right renal parenchyma atrophy. There is obstruction of the proximal right ureter secondary to retroperitoneal soft tissue mass implanted in the right psoas muscle. The left kidney is unremarkable. There is thickened appearance of the urinary bladder as seen on the prior CT. Stomach/Bowel: Perirectal stranding and circumferential thickening of the rectal wall as seen previously and may be related to post radiation changes. There is sigmoid diverticulosis without active inflammatory changes. There is moderate stool throughout the colon. There is no bowel obstruction. The appendix is normal. Vascular/Lymphatic: Mild aortoiliac atherosclerotic disease. There is compression of the IVC secondary to a 4.4 x 2.4 cm (36/2) retroperitoneal mass/adenopathy on the psoas muscle. No portal venous gas. Reproductive: The uterus is grossly unremarkable. No adnexal masses. Other: None Musculoskeletal: None degenerative changes of the spine and hips. No acute osseous pathology. IMPRESSION: 1. Severe right hydronephrosis with right renal parenchyma atrophy similar to prior CT and secondary to obstruction of the proximal right ureter by retroperitoneal soft tissue mass/adenopathy on the right psoas muscle. 2.  Thickened appearance of the urinary bladder and rectum as seen previously and may be related to post radiation changes. 3. Sigmoid diverticulosis. No bowel obstruction. Normal appendix. 4. Aortic Atherosclerosis (ICD10-I70.0). Electronically Signed   By: AAnner CreteM.D.   On: 05/16/2021 01:35   CT CHEST ABDOMEN PELVIS W CONTRAST  Result Date: 05/12/2021 CLINICAL DATA:  Anal cancer restaging EXAM: CT CHEST, ABDOMEN, AND PELVIS WITH CONTRAST TECHNIQUE: Multidetector CT imaging of the chest, abdomen and pelvis was performed following the standard protocol during bolus administration of intravenous contrast. CONTRAST:  664mOMNIPAQUE IOHEXOL 350 MG/ML SOLN, additional oral enteric contrast COMPARISON:  PET-CT, 02/12/2021, CT abdomen pelvis, 01/16/2021 FINDINGS: CT CHEST FINDINGS Cardiovascular: Aortic atherosclerosis. Normal heart size. Left coronary artery calcifications. No pericardial effusion. Mediastinum/Nodes: No enlarged mediastinal or hilar lymph nodes. Interval decrease in size of left axillary and subpectoral lymph nodes, previously FDG avid, a previously seen subpectoral node now almost completely resolved, measuring no larger than 3-4 mm (series 2, image 12), and a left axillary node measuring 1.3 x 0.9 cm, previously 1.5 x 1.1 cm (series 2, image 14). Thyroid gland, trachea, and esophagus demonstrate no significant findings. Lungs/Pleura: FDG avid nodule of the lateral segment right middle lobe is diminished in size, 0.4 cm, previously 0.6 cm (series 4, image 70). Additional subpleural nodule of the anterior right  middle lobe is slightly enlarged, measuring 0.7 cm, previously 0.4 cm (series 4, image 99). Mild dependent bibasilar scarring. No pleural effusion or pneumothorax. Musculoskeletal: No chest wall mass or suspicious bone lesions identified. CT ABDOMEN PELVIS FINDINGS Hepatobiliary: No solid liver abnormality is seen. No gallstones, gallbladder wall thickening, or biliary dilatation.  Pancreas: Unremarkable. No pancreatic ductal dilatation or surrounding inflammatory changes. Spleen: Normal in size without significant abnormality. Adrenals/Urinary Tract: Adrenal glands are unremarkable. Redemonstrated obstruction of the proximal right ureter by soft tissue nodule detailed below, with severe right hydronephrosis and cortical atrophy. The left kidney is normal. No calculi or hydronephrosis. Unchanged thickening of the urinary bladder. Stomach/Bowel: Stomach is within normal limits. Appendix appears normal. Unchanged circumferential thickening of the low rectum and perirectal and presacral fat stranding, likely sequelae of local radiation therapy (series 2, image 96). Vascular/Lymphatic: Aortic atherosclerosis. Unchanged enlarged, matted appearing previously FDG avid right iliac lymph nodes or soft tissue nodule, measuring 3.9 x 2.8 cm (series 2, image 68). Reproductive: No mass or other abnormality. Other: No abdominal wall hernia or abnormality. No abdominopelvic ascites. Musculoskeletal: No acute or significant osseous findings. IMPRESSION: 1. FDG avid nodule of the lateral segment right middle lobe is diminished in size, 0.4 cm, previously 0.6 cm. 2. Additional subpleural nodule of the anterior right middle lobe is slightly enlarged, measuring 0.7 cm, previously 0.4 cm. 3. Interval decrease in size of previously FDG avid left axillary and subpectoral lymph nodes. 4. Unchanged enlarged, matted appearing previously FDG avid right iliac lymph nodes or soft tissue nodule. 5. Overall constellation of findings is most consistent with stable to slightly improved metastatic disease, with the exception of enlarged right middle lobe pulmonary nodule as detailed above. 6. Redemonstrated obstruction of the proximal right ureter by soft tissue nodule detailed above, with severe right hydronephrosis and cortical atrophy. 7. Unchanged circumferential thickening of the low rectum and perirectal and presacral  fat stranding, likely sequelae of local radiation therapy. 8. Unchanged thickening of the urinary bladder, which may be related to radiation therapy or nonspecific infectious/inflammatory cystitis. 9. Coronary artery disease. Aortic Atherosclerosis (ICD10-I70.0). Electronically Signed   By: Eddie Candle M.D.   On: 05/12/2021 08:47   DG Abdomen Acute W/Chest  Result Date: 05/15/2021 CLINICAL DATA:  Abdominal pain, constipation EXAM: DG ABDOMEN ACUTE WITH 1 VIEW CHEST COMPARISON:  CT chest abdomen pelvis dated 05/12/2021 FINDINGS: Increased interstitial markings. Known small pulmonary nodules are better evaluated on recent CT. No focal consolidation. No pleural effusion or pneumothorax. The heart is normal in size. Nonobstructive bowel gas pattern.  Normal colonic stool burden. No evidence of free air under the diaphragm on the upright view. Degenerative changes of the visualized thoracolumbar spine. IMPRESSION: No evidence of acute cardiopulmonary disease. No evidence of small bowel obstruction or free air. Normal colonic stool burden. Electronically Signed   By: Julian Hy M.D.   On: 05/15/2021 23:14    ASSESSMENT AND PLAN:  Anal cancer CT abdomen/pelvis 10/21/2016-thickening of the anus extending to the junction between the anus and rectum with a large stool ball in the rectum and mild fat stranding posterior to the rectum. Enlarged lymph nodes in the right and left inguinal regions. A few mildly prominent nodes seen posterior to the rectum. 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 PET scan [id number] hypermetabolic anal mass with hypermetabolic metastases to the groin region bilaterally, left pelvic sidewall and presacral space. Initiation of radiation and cycle 1 5-FU/mitomycin C 11/14/2016 Cycle 2 5-FU/mitomycin C 12/12/2016 (5-FU dose  reduced due to mucositis, diarrhea, skin breakdown) Radiation completed 12/23/2016 CT abdomen/pelvis 04/14/2017-resolution of  anal mass and bilateral inguinal lymphadenopathy. No residual tumor seen. CT abdomen/pelvis 01/28/2020-right hydroureteronephrosis, transition at the level of the mid right ureter, retroperitoneal lymphadenopathy CT renal stone study 01/28/2020-severe right hydronephrosis and proximal right hydroureter, 8 mm area of increased attenuation in the proximal right ureter-stone versus soft tissue mass PET 02/13/2020-hypermetabolic right retroperitoneal nodal metastases in the aortocaval and posterior pericaval chains, nonspecific mild anal wall hypermetabolism, right nephroureteral stent 02/21/2020 anorectal exam per Dr. Ashley Jacobs radiation changes of the skin around the anal margin.  Posterior midline smooth scarring.  Mildly stenotic.  Stenosis seems better.  No palpable concerning lesions.  Entire anal canal and distal rectum feels smooth and healthy.  Partial anoscopy performed with no lesions in the anal canal. Xeloda/radiation beginning 03/02/2020, completed 04/13/2020 CT abdomen/pelvis 06/17/2020-resolution of right retroperitoneal lymphadenopathy, no remaining pathologically enlarged lymph nodes, residual mild right hydronephrosis, no evidence of progressive disease, stable anal wall thickening Digital rectal exam by Dr. Quentin Cornwall 08/26/2020-posterior midline smooth scarring.  Mildly stenotic.  Stenosis seems better.  No palpable concerning lesions.  Anal canal feels smooth without palpable concern.  Unable to tolerate anoscopy.  Next visit in 6 months for surveillance. CT abdomen/pelvis 10/28/2020-no evidence of local recurrence or metastatic disease.  Stable mild rectal wall thickening.  New diffuse bladder wall thickening possibly related to prior radiation CT abdomen/pelvis 01/16/2021- recurrent right-sided hydronephrosis and proximal right hydroureter status post removal of right ureter stent, new soft tissue nodule along the course of the right ureter between the right psoas and IVC suspicious for  recurrent lymphadenopathy PET 02/12/2021-small focus of hypermetabolism at the anorectal junction without a definable mass, right retroperitoneal nodal metastasis with obstruction of the proximal right ureter, right middle lobe hypermetabolic nodule, hypermetabolism in the right hilum without a defined nodal mass, hypermetabolic left supraclavicular and left axillary nodes 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 Question direct extension from the anal cancer versus metastatic disease from anal cancer versus a separate malignant process. 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 Multiple family members with breast cancer. History of hypokalemia-likely secondary to decreased nutritional intake and diarrhea; potassium in normal range 01/16/2017. No longer taking a potassium supplement. Right hydroureteronephrosis on CT 01/28/2020, status post a cystoscopy/pyelogram 01/28/2020 confirming extrinsic compression of the right ureter, status post stent placement Right ureter stent exchange 06/12/2020 Right ureter stent removed 01/13/2021   8.  Admission 01/07/2021 with Klebsiella pneumoniae urosepsis/bacteremia  9.  Hospital admission 05/15/2021 with UTI/possible right pyelonephritis  Ms. Riggan was admitted with UTI/possible right pyelonephritis.  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  They recommended conservative management and she received treatment with IV antibiotics and is now transitioning to p.o. antibiotics.  Recommend continuation of oral antibiotics and outpatient follow-up with urology.  Will defer to urology regarding stent placement versus nephrostomy tube placement.  From an oncology standpoint, she has  had stable disease.  We will plan to continue her current chemotherapy regimen as previously scheduled.  She has outpatient follow-up already scheduled on 06/02/2021.  If she has disease progression on her current therapy, will consider switching her to nivolumab.  Recommendations: 1.  Continue oral antibiotics and outpatient follow-up with urology. 2.  Keep outpatient follow-up as previously scheduled at the cancer center on 06/02/2021. 3.  Will defer to urology regarding stent placement versus nephrostomy tube placement.   LOS: 2 days   Mikey Bussing, DNP, AGPCNP-BC, AOCNP 05/18/21 Ms. Winslett was admitted on 05/16/2021 with severe abdominal pain and constipation.  She now feels better.  She has been treated with antibiotics for possible urinary tract infection.  She was seen by urology.  She could not tolerate a ureter stent in the past.  She may be a candidate for a nephrostomy in the future.  She is now day 7 following a cycle of Taxol/carboplatin.  She will return for a nadir CBC as scheduled on 05/21/2021.  I was present for greater than 50% of today's visit.  I performed medical decision making.

## 2021-05-18 NOTE — Progress Notes (Addendum)
Mobility Specialist - Progress Note    05/18/21 1000  Mobility  Activity Ambulated in hall  Level of Assistance Independent after set-up  Assistive Device None  Distance Ambulated (ft) 500 ft  Mobility Ambulated independently in hallway  Mobility Response Tolerated well  Mobility performed by Mobility specialist  $Mobility charge 1 Mobility    Pre-mobility: 98% SpO2  Upon entry pt c/o of side pain but was agreeable to ambulate in hallway and used no assistive device to walk 500 ft in hallway. Pt very pleasant and tolerated session well. No other complaints were mentioned during session. Pt was returned to bed after ambulation with call bell at side.   [place] Specialist [facility] Services Phone: [phone] 05/18/21, 10:03 AM

## 2021-05-19 ENCOUNTER — Other Ambulatory Visit: Payer: Self-pay

## 2021-05-19 NOTE — Progress Notes (Signed)
The proposed treatment discussed in conference is for discussion purpose only and is not a binding recommendation.  The patients have not been physically examined, or presented with their treatment options.  Therefore, final treatment plans cannot be decided.  

## 2021-05-20 ENCOUNTER — Telehealth: Payer: Self-pay

## 2021-05-20 NOTE — Telephone Encounter (Signed)
Transition Care Management Follow-up Telephone Call Date of discharge and from where: [facility] 05/18/2021 How have you been since you were released from the hospital? Much better  Any questions or concerns? No  Items Reviewed: Did the pt receive and understand the discharge instructions provided? Yes  Medications obtained and verified? Yes  Other?  N/a Any new allergies since your discharge? No  Dietary orders reviewed? Yes Do you have support at home? Yes   Home Care and Equipment/Supplies: Were home health services ordered? not applicable If so, what is the name of the agency? N/a Has the agency set up a time to come to the patient's home? not applicable Were any new equipment or medical supplies ordered?  No What is the name of the medical supply agency? N/a Were you able to get the supplies/equipment? not applicable Do you have any questions related to the use of the equipment or supplies? No  Functional Questionnaire: (I = Independent and D = Dependent) ADLs: I  Bathing/Dressing- I  Meal Prep- I  Eating- I  Maintaining continence- I  Transferring/Ambulation- I  Managing Meds- I  Follow up appointments reviewed:  PCP Hospital f/u appt confirmed? Yes  Scheduled to see Dr.Panosh  on 06/08/2021 @ 300pm. [facility] f/u appt confirmed? Yes  Scheduled to see Dr.GAY on 08/19/2021 @ 1200. Are transportation arrangements needed? No  If their condition worsens, is the pt aware to call PCP or go to the Emergency Dept.? Yes Was the patient provided with contact information for the PCP's office or ED? Yes Was to pt encouraged to call back with questions or concerns? Yes

## 2021-05-21 ENCOUNTER — Telehealth: Payer: Self-pay

## 2021-05-21 ENCOUNTER — Other Ambulatory Visit: Payer: Self-pay

## 2021-05-21 ENCOUNTER — Inpatient Hospital Stay: Payer: Medicare HMO | Attending: Nurse Practitioner

## 2021-05-21 DIAGNOSIS — C210 Malignant neoplasm of anus, unspecified: Secondary | ICD-10-CM | POA: Insufficient documentation

## 2021-05-21 DIAGNOSIS — C772 Secondary and unspecified malignant neoplasm of intra-abdominal lymph nodes: Secondary | ICD-10-CM | POA: Diagnosis not present

## 2021-05-21 DIAGNOSIS — Z5111 Encounter for antineoplastic chemotherapy: Secondary | ICD-10-CM | POA: Diagnosis not present

## 2021-05-21 LAB — CULTURE, BLOOD (ROUTINE X 2)
Culture: NO GROWTH
Culture: NO GROWTH
Special Requests: ADEQUATE
Special Requests: ADEQUATE

## 2021-05-21 LAB — CBC WITH DIFFERENTIAL (CANCER CENTER ONLY)
Abs Immature Granulocytes: 0.01 10*3/uL (ref 0.00–0.07)
Basophils Absolute: 0 10*3/uL (ref 0.0–0.1)
Basophils Relative: 1 %
Eosinophils Absolute: 0.1 10*3/uL (ref 0.0–0.5)
Eosinophils Relative: 1 %
HCT: 30.2 % — ABNORMAL LOW (ref 36.0–46.0)
Hemoglobin: 9.5 g/dL — ABNORMAL LOW (ref 12.0–15.0)
Immature Granulocytes: 0 %
Lymphocytes Relative: 13 %
Lymphs Abs: 0.5 10*3/uL — ABNORMAL LOW (ref 0.7–4.0)
MCH: 28.4 pg (ref 26.0–34.0)
MCHC: 31.5 g/dL (ref 30.0–36.0)
MCV: 90.4 fL (ref 80.0–100.0)
Monocytes Absolute: 0.4 10*3/uL (ref 0.1–1.0)
Monocytes Relative: 10 %
Neutro Abs: 3.1 10*3/uL (ref 1.7–7.7)
Neutrophils Relative %: 75 %
Platelet Count: 178 10*3/uL (ref 150–400)
RBC: 3.34 MIL/uL — ABNORMAL LOW (ref 3.87–5.11)
RDW: 18.6 % — ABNORMAL HIGH (ref 11.5–15.5)
WBC Count: 4.2 10*3/uL (ref 4.0–10.5)
nRBC: 0 % (ref 0.0–0.2)

## 2021-05-21 NOTE — Telephone Encounter (Signed)
-----   Message from [name], MD sent at 05/21/2021  1:17 PM EDT ----- Please call patient, blood counts look good, follow-up as scheduled

## 2021-05-21 NOTE — Telephone Encounter (Signed)
TC to Pt informed her of good blood counts per Dr. Benay Spice and follow up as necessary. Pt verbalized understanding.

## 2021-05-26 DIAGNOSIS — N130 Hydronephrosis with ureteropelvic junction obstruction: Secondary | ICD-10-CM | POA: Diagnosis not present

## 2021-05-26 DIAGNOSIS — R311 Benign essential microscopic hematuria: Secondary | ICD-10-CM | POA: Diagnosis not present

## 2021-05-26 DIAGNOSIS — R310 Gross hematuria: Secondary | ICD-10-CM | POA: Diagnosis not present

## 2021-05-29 ENCOUNTER — Other Ambulatory Visit: Payer: Self-pay | Admitting: Oncology

## 2021-05-31 ENCOUNTER — Other Ambulatory Visit: Payer: Self-pay | Admitting: Internal Medicine

## 2021-06-02 ENCOUNTER — Encounter: Payer: Self-pay | Admitting: Nurse Practitioner

## 2021-06-02 ENCOUNTER — Inpatient Hospital Stay: Payer: Medicare HMO

## 2021-06-02 ENCOUNTER — Inpatient Hospital Stay (HOSPITAL_BASED_OUTPATIENT_CLINIC_OR_DEPARTMENT_OTHER): Payer: Medicare HMO | Admitting: Nurse Practitioner

## 2021-06-02 ENCOUNTER — Other Ambulatory Visit: Payer: Self-pay

## 2021-06-02 DIAGNOSIS — C210 Malignant neoplasm of anus, unspecified: Secondary | ICD-10-CM

## 2021-06-02 DIAGNOSIS — Z5111 Encounter for antineoplastic chemotherapy: Secondary | ICD-10-CM | POA: Diagnosis not present

## 2021-06-02 DIAGNOSIS — C772 Secondary and unspecified malignant neoplasm of intra-abdominal lymph nodes: Secondary | ICD-10-CM | POA: Diagnosis not present

## 2021-06-02 LAB — CMP (CANCER CENTER ONLY)
ALT: 11 U/L (ref 0–44)
AST: 23 U/L (ref 15–41)
Albumin: 4.2 g/dL (ref 3.5–5.0)
Alkaline Phosphatase: 74 U/L (ref 38–126)
Anion gap: 8 (ref 5–15)
BUN: 20 mg/dL (ref 8–23)
CO2: 25 mmol/L (ref 22–32)
Calcium: 9.8 mg/dL (ref 8.9–10.3)
Chloride: 103 mmol/L (ref 98–111)
Creatinine: 0.8 mg/dL (ref 0.44–1.00)
GFR, Estimated: >60 mL/min (ref >60)
Glucose, Bld: 78 mg/dL (ref 70–99)
Potassium: 4.1 mmol/L (ref 3.5–5.1)
Sodium: 136 mmol/L (ref 135–145)
Total Bilirubin: 0.3 mg/dL (ref 0.3–1.2)
Total Protein: 7.6 g/dL (ref 6.5–8.1)

## 2021-06-02 LAB — CBC WITH DIFFERENTIAL (CANCER CENTER ONLY)
Abs Immature Granulocytes: 0.01 10*3/uL (ref 0.00–0.07)
Basophils Absolute: 0 10*3/uL (ref 0.0–0.1)
Basophils Relative: 1 %
Eosinophils Absolute: 0 10*3/uL (ref 0.0–0.5)
Eosinophils Relative: 1 %
HCT: 34.1 % — ABNORMAL LOW (ref 36.0–46.0)
Hemoglobin: 10.6 g/dL — ABNORMAL LOW (ref 12.0–15.0)
Immature Granulocytes: 0 %
Lymphocytes Relative: 11 %
Lymphs Abs: 0.4 10*3/uL — ABNORMAL LOW (ref 0.7–4.0)
MCH: 28.3 pg (ref 26.0–34.0)
MCHC: 31.1 g/dL (ref 30.0–36.0)
MCV: 91.2 fL (ref 80.0–100.0)
Monocytes Absolute: 0.4 10*3/uL (ref 0.1–1.0)
Monocytes Relative: 11 %
Neutro Abs: 3 10*3/uL (ref 1.7–7.7)
Neutrophils Relative %: 76 %
Platelet Count: 174 10*3/uL (ref 150–400)
RBC: 3.74 MIL/uL — ABNORMAL LOW (ref 3.87–5.11)
RDW: 18.2 % — ABNORMAL HIGH (ref 11.5–15.5)
WBC Count: 3.9 10*3/uL — ABNORMAL LOW (ref 4.0–10.5)
nRBC: 0 % (ref 0.0–0.2)

## 2021-06-02 MED ORDER — SODIUM CHLORIDE 0.9 % IV SOLN
264.9000 mg | Freq: Once | INTRAVENOUS | Status: AC
  Administered 2021-06-02: 260 mg via INTRAVENOUS
  Filled 2021-06-02: qty 26

## 2021-06-02 MED ORDER — SODIUM CHLORIDE 0.9 % IV SOLN
120.0000 mg/m2 | Freq: Once | INTRAVENOUS | Status: AC
  Administered 2021-06-02: 222 mg via INTRAVENOUS
  Filled 2021-06-02: qty 37

## 2021-06-02 MED ORDER — PALONOSETRON HCL INJECTION 0.25 MG/5ML
0.2500 mg | Freq: Once | INTRAVENOUS | Status: AC
  Administered 2021-06-02: 0.25 mg via INTRAVENOUS
  Filled 2021-06-02: qty 5

## 2021-06-02 MED ORDER — SODIUM CHLORIDE 0.9 % IV SOLN
10.0000 mg | Freq: Once | INTRAVENOUS | Status: AC
  Administered 2021-06-02: 10 mg via INTRAVENOUS
  Filled 2021-06-02: qty 1

## 2021-06-02 MED ORDER — SODIUM CHLORIDE 0.9 % IV SOLN
150.0000 mg | Freq: Once | INTRAVENOUS | Status: AC
  Administered 2021-06-02: 150 mg via INTRAVENOUS
  Filled 2021-06-02: qty 5

## 2021-06-02 MED ORDER — FAMOTIDINE 20 MG IN NS 100 ML IVPB
20.0000 mg | Freq: Once | INTRAVENOUS | Status: AC
  Administered 2021-06-02: 20 mg via INTRAVENOUS
  Filled 2021-06-02: qty 100

## 2021-06-02 MED ORDER — LORAZEPAM 0.5 MG PO TABS: 0.5000 mg | ORAL_TABLET | Freq: Two times a day (BID) | ORAL | 0 refills | Status: DC | PRN

## 2021-06-02 MED ORDER — SODIUM CHLORIDE 0.9 % IV SOLN: Freq: Once | INTRAVENOUS | Status: AC

## 2021-06-02 NOTE — Patient Instructions (Signed)
[facility]  Discharge Instructions: Thank you for choosing [facility] to provide your oncology and hematology care.   If you have a lab appointment with the [facility], please go directly to the [facility] and check in at the registration area.   Wear comfortable clothing and clothing appropriate for easy access to any Portacath or PICC line.   We strive to give you quality time with your provider. You may need to reschedule your appointment if you arrive late (15 or more minutes).  Arriving late affects you and other patients whose appointments are after yours.  Also, if you miss three or more appointments without notifying the office, you may be dismissed from the clinic at the provider's discretion.      For prescription refill requests, have your pharmacy contact our office and allow 72 hours for refills to be completed.    Today you received the following chemotherapy and/or immunotherapy agents TAXOL and CARBOPLATIN      To help prevent nausea and vomiting after your treatment, we encourage you to take your nausea medication as directed.  BELOW ARE SYMPTOMS THAT SHOULD BE REPORTED IMMEDIATELY: *FEVER GREATER THAN 100.4 F (38 C) OR HIGHER *CHILLS OR SWEATING *NAUSEA AND VOMITING THAT IS NOT CONTROLLED WITH YOUR NAUSEA MEDICATION *UNUSUAL SHORTNESS OF BREATH *UNUSUAL BRUISING OR BLEEDING *URINARY PROBLEMS (pain or burning when urinating, or frequent urination) *BOWEL PROBLEMS (unusual diarrhea, constipation, pain near the anus) TENDERNESS IN MOUTH AND THROAT WITH OR WITHOUT PRESENCE OF ULCERS (sore throat, sores in mouth, or a toothache) UNUSUAL RASH, SWELLING OR PAIN  UNUSUAL VAGINAL DISCHARGE OR ITCHING   Items with * indicate a potential emergency and should be followed up as soon as possible or go to the Emergency Department if any problems should occur.  Please show the CHEMOTHERAPY ALERT CARD or IMMUNOTHERAPY ALERT CARD at  check-in to the Emergency Department and triage nurse.  Should you have questions after your visit or need to cancel or reschedule your appointment, please contact [facility]  Dept: [phone]  and follow the prompts.  Office hours are 8:00 a.m. to 4:30 p.m. Monday - Friday. Please note that voicemails left after 4:00 p.m. may not be returned until the following business day.  We are closed weekends and major holidays. You have access to a nurse at all times for urgent questions. Please call the main number to the clinic Dept: [phone] and follow the prompts.   For any non-urgent questions, you may also contact your provider using MyChart. We now offer e-Visits for anyone 6 and older to request care online for non-urgent symptoms. For details visit [email or web address].   Also download the MyChart app! Go to the app store, search "MyChart", open the app, select [facility], and log in with your MyChart username and password.  Due to Covid, a mask is required upon entering the hospital/clinic. If you do not have a mask, one will be given to you upon arrival. For doctor visits, patients may have 1 support person aged 84 or older with them. For treatment visits, patients cannot have anyone with them due to current Covid guidelines and our immunocompromised population.   Paclitaxel injection What is this medication? PACLITAXEL (PAK li TAX el) is a chemotherapy drug. It targets fast dividing cells, like cancer cells, and causes these cells to die. This medicine is used to treat ovarian cancer, breast cancer, lung cancer, Kaposi's sarcoma, and other cancers. This  medicine may be used for other purposes; ask your health care provider or pharmacist if you have questions. COMMON BRAND NAME(S): Onxol, Taxol What should I tell my care team before I take this medication? They need to know if you have any of these conditions: history of irregular heartbeat liver  disease low blood counts, like low white cell, platelet, or red cell counts lung or breathing disease, like asthma tingling of the fingers or toes, or other nerve disorder an unusual or allergic reaction to paclitaxel, alcohol, polyoxyethylated castor oil, other chemotherapy, other medicines, foods, dyes, or preservatives pregnant or trying to get pregnant breast-feeding How should I use this medication? This drug is given as an infusion into a vein. It is administered in a hospital or clinic by a specially trained health care professional. Talk to your pediatrician regarding the use of this medicine in children. Special care may be needed. Overdosage: If you think you have taken too much of this medicine contact a poison control center or emergency room at once. NOTE: This medicine is only for you. Do not share this medicine with others. What if I miss a dose? It is important not to miss your dose. Call your doctor or health care professional if you are unable to keep an appointment. What may interact with this medication? Do not take this medicine with any of the following medications: live virus vaccines This medicine may also interact with the following medications: antiviral medicines for hepatitis, HIV or AIDS certain antibiotics like erythromycin and clarithromycin certain medicines for fungal infections like ketoconazole and itraconazole certain medicines for seizures like carbamazepine, phenobarbital, phenytoin gemfibrozil nefazodone rifampin St. John's wort This list may not describe all possible interactions. Give your health care provider a list of all the medicines, herbs, non-prescription drugs, or dietary supplements you use. Also tell them if you smoke, drink alcohol, or use illegal drugs. Some items may interact with your medicine. What should I watch for while using this medication? Your condition will be monitored carefully while you are receiving this medicine. You  will need important blood work done while you are taking this medicine. This medicine can cause serious allergic reactions. To reduce your risk you will need to take other medicine(s) before treatment with this medicine. If you experience allergic reactions like skin rash, itching or hives, swelling of the face, lips, or tongue, tell your doctor or health care professional right away. In some cases, you may be given additional medicines to help with side effects. Follow all directions for their use. This drug may make you feel generally unwell. This is not uncommon, as chemotherapy can affect healthy cells as well as cancer cells. Report any side effects. Continue your course of treatment even though you feel ill unless your doctor tells you to stop. Call your doctor or health care professional for advice if you get a fever, chills or sore throat, or other symptoms of a cold or flu. Do not treat yourself. This drug decreases your body's ability to fight infections. Try to avoid being around people who are sick. This medicine may increase your risk to bruise or bleed. Call your doctor or health care professional if you notice any unusual bleeding. Be careful brushing and flossing your teeth or using a toothpick because you may get an infection or bleed more easily. If you have any dental work done, tell your dentist you are receiving this medicine. Avoid taking products that contain aspirin, acetaminophen, ibuprofen, naproxen, or ketoprofen unless instructed  by your doctor. These medicines may hide a fever. Do not become pregnant while taking this medicine. Women should inform their doctor if they wish to become pregnant or think they might be pregnant. There is a potential for serious side effects to an unborn child. Talk to your health care professional or pharmacist for more information. Do not breast-feed an infant while taking this medicine. Men are advised not to father a child while receiving this  medicine. This product may contain alcohol. Ask your pharmacist or healthcare provider if this medicine contains alcohol. Be sure to tell all healthcare providers you are taking this medicine. Certain medicines, like metronidazole and disulfiram, can cause an unpleasant reaction when taken with alcohol. The reaction includes flushing, headache, nausea, vomiting, sweating, and increased thirst. The reaction can last from 30 minutes to several hours. What side effects may I notice from receiving this medication? Side effects that you should report to your doctor or health care professional as soon as possible: allergic reactions like skin rash, itching or hives, swelling of the face, lips, or tongue breathing problems changes in vision fast, irregular heartbeat high or low blood pressure mouth sores pain, tingling, numbness in the hands or feet signs of decreased platelets or bleeding - bruising, pinpoint red spots on the skin, black, tarry stools, blood in the urine signs of decreased red blood cells - unusually weak or tired, feeling faint or lightheaded, falls signs of infection - fever or chills, cough, sore throat, pain or difficulty passing urine signs and symptoms of liver injury like dark yellow or brown urine; general ill feeling or flu-like symptoms; light-colored stools; loss of appetite; nausea; right upper belly pain; unusually weak or tired; yellowing of the eyes or skin swelling of the ankles, feet, hands unusually slow heartbeat Side effects that usually do not require medical attention (report to your doctor or health care professional if they continue or are bothersome): diarrhea hair loss loss of appetite muscle or joint pain nausea, vomiting pain, redness, or irritation at site where injected tiredness This list may not describe all possible side effects. Call your doctor for medical advice about side effects. You may report side effects to FDA at 1-800-FDA-1088. Where  should I keep my medication? This drug is given in a hospital or clinic and will not be stored at home. NOTE: This sheet is a summary. It may not cover all possible information. If you have questions about this medicine, talk to your doctor, pharmacist, or health care provider.  2022 Elsevier/Gold Standard (2019-08-07 13:37:23)  Carboplatin injection What is this medication? CARBOPLATIN (KAR boe pla tin) is a chemotherapy drug. It targets fast dividing cells, like cancer cells, and causes these cells to die. This medicine is used to treat ovarian cancer and many other cancers. This medicine may be used for other purposes; ask your health care provider or pharmacist if you have questions. COMMON BRAND NAME(S): Paraplatin What should I tell my care team before I take this medication? They need to know if you have any of these conditions: blood disorders hearing problems kidney disease recent or ongoing radiation therapy an unusual or allergic reaction to carboplatin, cisplatin, other chemotherapy, other medicines, foods, dyes, or preservatives pregnant or trying to get pregnant breast-feeding How should I use this medication? This drug is usually given as an infusion into a vein. It is administered in a hospital or clinic by a specially trained health care professional. Talk to your pediatrician regarding the use of this medicine  in children. Special care may be needed. Overdosage: If you think you have taken too much of this medicine contact a poison control center or emergency room at once. NOTE: This medicine is only for you. Do not share this medicine with others. What if I miss a dose? It is important not to miss a dose. Call your doctor or health care professional if you are unable to keep an appointment. What may interact with this medication? medicines for seizures medicines to increase blood counts like filgrastim, pegfilgrastim, sargramostim some antibiotics like amikacin,  gentamicin, neomycin, streptomycin, tobramycin vaccines Talk to your doctor or health care professional before taking any of these medicines: acetaminophen aspirin ibuprofen ketoprofen naproxen This list may not describe all possible interactions. Give your health care provider a list of all the medicines, herbs, non-prescription drugs, or dietary supplements you use. Also tell them if you smoke, drink alcohol, or use illegal drugs. Some items may interact with your medicine. What should I watch for while using this medication? Your condition will be monitored carefully while you are receiving this medicine. You will need important blood work done while you are taking this medicine. This drug may make you feel generally unwell. This is not uncommon, as chemotherapy can affect healthy cells as well as cancer cells. Report any side effects. Continue your course of treatment even though you feel ill unless your doctor tells you to stop. In some cases, you may be given additional medicines to help with side effects. Follow all directions for their use. Call your doctor or health care professional for advice if you get a fever, chills or sore throat, or other symptoms of a cold or flu. Do not treat yourself. This drug decreases your body's ability to fight infections. Try to avoid being around people who are sick. This medicine may increase your risk to bruise or bleed. Call your doctor or health care professional if you notice any unusual bleeding. Be careful brushing and flossing your teeth or using a toothpick because you may get an infection or bleed more easily. If you have any dental work done, tell your dentist you are receiving this medicine. Avoid taking products that contain aspirin, acetaminophen, ibuprofen, naproxen, or ketoprofen unless instructed by your doctor. These medicines may hide a fever. Do not become pregnant while taking this medicine. Women should inform their doctor if they wish  to become pregnant or think they might be pregnant. There is a potential for serious side effects to an unborn child. Talk to your health care professional or pharmacist for more information. Do not breast-feed an infant while taking this medicine. What side effects may I notice from receiving this medication? Side effects that you should report to your doctor or health care professional as soon as possible: allergic reactions like skin rash, itching or hives, swelling of the face, lips, or tongue signs of infection - fever or chills, cough, sore throat, pain or difficulty passing urine signs of decreased platelets or bleeding - bruising, pinpoint red spots on the skin, black, tarry stools, nosebleeds signs of decreased red blood cells - unusually weak or tired, fainting spells, lightheadedness breathing problems changes in hearing changes in vision chest pain high blood pressure low blood counts - This drug may decrease the number of white blood cells, red blood cells and platelets. You may be at increased risk for infections and bleeding. nausea and vomiting pain, swelling, redness or irritation at the injection site pain, tingling, numbness in the hands or  feet problems with balance, talking, walking trouble passing urine or change in the amount of urine Side effects that usually do not require medical attention (report to your doctor or health care professional if they continue or are bothersome): hair loss loss of appetite metallic taste in the mouth or changes in taste This list may not describe all possible side effects. Call your doctor for medical advice about side effects. You may report side effects to FDA at 1-800-FDA-1088. Where should I keep my medication? This drug is given in a hospital or clinic and will not be stored at home. NOTE: This sheet is a summary. It may not cover all possible information. If you have questions about this medicine, talk to your doctor, pharmacist,  or health care provider.  2022 Elsevier/Gold Standard (2007-12-11 14:38:05)

## 2021-06-02 NOTE — Progress Notes (Signed)
[facility] OFFICE PROGRESS NOTE   Diagnosis: Anal cancer  INTERVAL HISTORY:   [name] returns as scheduled.  She completed cycle 4 Taxol/carboplatin 05/12/2021.  She was hospitalized 05/15/2021 through 05/18/2021 with nausea and abdominal pain.  She was experiencing constipation.  CT scan showed severe right hydronephrosis.  She is following up with urology.  She reports continued urinary frequency, dysuria, hematuria.  No fever or chills.  No back pain.  Objective:  Vital signs in last 24 hours:  Blood pressure (!) 144/90, pulse 90, temperature 98.1 F (36.7 C), temperature source Oral, resp. rate 18, height '5\' 2"'$  (1.575 m), weight 164 lb 12.8 oz (74.8 kg), SpO2 100 %.    HEENT: No thrush or ulcers. Lymphatics: Approximately 1.5 cm mobile left supraclavicular lymph node. Resp: Lungs clear bilaterally. Cardio: Regular rate and rhythm. GI: Abdomen soft and nontender.  No hepatosplenomegaly. Vascular: No significant leg edema.   Lab Results:  Lab Results  Component Value Date   WBC 3.9 (L) 06/02/2021   HGB 10.6 (L) 06/02/2021   HCT 34.1 (L) 06/02/2021   MCV 91.2 06/02/2021   PLT 174 06/02/2021   NEUTROABS 3.0 06/02/2021    Imaging:  No results found.  Medications: I have reviewed the patient's current medications.  Assessment/Plan:  Anal cancer CT abdomen/pelvis 10/21/2016-thickening of the anus extending to the junction between the anus and rectum with a large stool ball in the rectum and mild fat stranding posterior to the rectum. Enlarged lymph nodes in the right and left inguinal regions. A few mildly prominent nodes seen posterior to the rectum. 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 PET scan [id number] hypermetabolic anal mass with hypermetabolic metastases to the groin region bilaterally, left pelvic sidewall and presacral space. Initiation of radiation and cycle 1 5-FU/mitomycin C 11/14/2016 Cycle 2  5-FU/mitomycin C 12/12/2016 (5-FU dose reduced due to mucositis, diarrhea, skin breakdown) Radiation completed 12/23/2016 CT abdomen/pelvis 04/14/2017-resolution of anal mass and bilateral inguinal lymphadenopathy. No residual tumor seen. CT abdomen/pelvis 01/28/2020-right hydroureteronephrosis, transition at the level of the mid right ureter, retroperitoneal lymphadenopathy CT renal stone study 01/28/2020-severe right hydronephrosis and proximal right hydroureter, 8 mm area of increased attenuation in the proximal right ureter-stone versus soft tissue mass PET 02/13/2020-hypermetabolic right retroperitoneal nodal metastases in the aortocaval and posterior pericaval chains, nonspecific mild anal wall hypermetabolism, right nephroureteral stent 02/21/2020 anorectal exam per Dr. Ashley Jacobs radiation changes of the skin around the anal margin.  Posterior midline smooth scarring.  Mildly stenotic.  Stenosis seems better.  No palpable concerning lesions.  Entire anal canal and distal rectum feels smooth and healthy.  Partial anoscopy performed with no lesions in the anal canal. Xeloda/radiation beginning 03/02/2020, completed 04/13/2020 CT abdomen/pelvis 06/17/2020-resolution of right retroperitoneal lymphadenopathy, no remaining pathologically enlarged lymph nodes, residual mild right hydronephrosis, no evidence of progressive disease, stable anal wall thickening Digital rectal exam by Dr. Quentin Cornwall 08/26/2020-posterior midline smooth scarring.  Mildly stenotic.  Stenosis seems better.  No palpable concerning lesions.  Anal canal feels smooth without palpable concern.  Unable to tolerate anoscopy.  Next visit in 6 months for surveillance. CT abdomen/pelvis 10/28/2020-no evidence of local recurrence or metastatic disease.  Stable mild rectal wall thickening.  New diffuse bladder wall thickening possibly related to prior radiation CT abdomen/pelvis 01/16/2021- recurrent right-sided hydronephrosis and proximal right  hydroureter status post removal of right ureter stent, new soft tissue nodule along the course of the right ureter between the right psoas and IVC suspicious for recurrent lymphadenopathy  PET 02/12/2021-small focus of hypermetabolism at the anorectal junction without a definable mass, right retroperitoneal nodal metastasis with obstruction of the proximal right ureter, right middle lobe hypermetabolic nodule, hypermetabolism in the right hilum without a defined nodal mass, hypermetabolic left supraclavicular and left axillary nodes 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 Question direct extension from the anal cancer versus metastatic disease from anal cancer versus a separate malignant process. 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 Multiple family members with breast cancer. History of hypokalemia-likely secondary to decreased nutritional intake and diarrhea; potassium in normal range 01/16/2017. No longer taking a potassium supplement. Right hydroureteronephrosis on CT 01/28/2020, status post a cystoscopy/pyelogram 01/28/2020 confirming extrinsic compression of the right ureter, status post stent placement Right ureter stent exchange 06/12/2020 Right ureter stent removed 01/13/2021   8.  Admission 01/07/2021 with Klebsiella  pneumoniae urosepsis/bacteremia  9.  Hospital admission 05/15/2021 with UTI/possible right pyelonephritis  Disposition: [name] appears unchanged.  She has completed 4 cycles of Taxol/carboplatin.  Plan to proceed with cycle 5 today as scheduled.  She reports developing constipation after cycle 4.  She will begin a laxative regimen this evening.  We reviewed the CBC and chemistry panel from today.  Labs adequate to proceed as above.  We had a long discussion today regarding the goal of chemotherapy.  She understands treatment is palliative, not curative.  We discussed focusing on quality of life with a supportive care approach.  We also discussed other treatment options including immunotherapy.  She agreed to proceed with Taxol/carboplatin today as scheduled.  She will return for follow-up 06/07/2021.  She will contact the office in the interim with any problems.    Ned Card ANP/GNP-BC   06/02/2021  9:21 AM

## 2021-06-02 NOTE — Progress Notes (Signed)
Patient presents for treatment. RN assessment completed along with the following:  Treatment Conditions (labs/vitals/weight) reviewed - Yes, and within treatment parameters.   Oncology Treatment Attestation completed for current therapy- Yes, on date 03/01/21 Informed consent completed and reflects current therapy/intent - Yes, on date 03/11/21             Provider progress note reviewed - Yes, today's provider note was reviewed. Treatment/Antibody/Supportive plan reviewed - "Yes, "Yes, and there are no adjustments needed for today's treatment."} S&H and other orders reviewed - Yes, and there are no additional orders identified. Previous treatment date reviewed - Yes, and the appropriate amount of time has elapsed between treatments. Clinic Hand Off Received from - L . [place]  Patient to proceed with treatment.

## 2021-06-03 ENCOUNTER — Telehealth: Payer: Self-pay | Admitting: Pharmacist

## 2021-06-03 NOTE — Chronic Care Management (AMB) (Signed)
Chronic Care Management Pharmacy Assistant   Name: Ashley Moore  MRN: [id number] DOB: [birth year]   Reason for Encounter: General Assessment Call    Conditions to be addressed/monitored: Anxiety and Pain  Recent office visits:  None  Recent consult visits:  06-02-2021 [name], NP (Oncology) - Patient presented for Anal cancer follow up. No medication changes.  05-12-2021 [name], MD (Oncology) - Patient presented for Anal cancer follow up. No medication changes.  05-11-2021 [place]- Patient presented for CT Chest ABD/Pelvic scans.  Hospital visits:  Medication Reconciliation was completed by comparing discharge summary, patient's EMR and Pharmacy list, and upon discussion with patient.  Patient presented to [facility] on 05-15-2021 due to Intractable abdominal pain and other concerns. Discharge date was 05-18-2021.   New?Medications Started at [facility] Discharge:?? -started  acetaminophen 500 MG 1,000 mg by mouth every 8 (eight) hours as needed   Medication Changes at Hospital Discharge: -Changed  levothyroxine 50 MCG What changed: when to take this  Medications Discontinued at Hospital Discharge: -Stopped  dexamethasone 2 MG  oxyCODONE-acetaminophen 5-325 MG  Medications that remain the same after Hospital Discharge:??  -All other medications will remain the same.    Medications: Outpatient Encounter Medications as of 06/03/2021  Medication Sig Note   acetaminophen (TYLENOL) 500 MG tablet Take 1,000 mg by mouth every 8 (eight) hours as needed for moderate pain.    calcium gluconate 500 MG tablet Take 1 tablet by mouth 3 (three) times daily. (Patient not taking: Reported on 06/02/2021)    Cholecalciferol (VITAMIN D3 PO) Take 2,000 Units by mouth daily.    conjugated estrogens (PREMARIN) vaginal cream Place 1 Applicatorful vaginally daily.    diclofenac Sodium (VOLTAREN) 1 % GEL Apply 2 g topically daily. To  shoulder    levothyroxine (SYNTHROID) 50 MCG tablet TAKE 1 TABLET EVERY DAY (NEED MD APPOINTMENT FOR REFILLS)    LORazepam (ATIVAN) 0.5 MG tablet Take 1 tablet (0.5 mg total) by mouth every 12 (twelve) hours as needed for anxiety.    Misc Natural Products (CYSTEX URINARY HEALTH PO) Take 1 capsule by mouth daily. (Patient not taking: Reported on 06/02/2021)    Multiple Vitamins-Minerals (MULTIVITAMIN ADULT) CHEW Chew 1 capsule by mouth daily.    MYRBETRIQ 50 MG TB24 tablet Take 50 mg by mouth daily.    ondansetron (ZOFRAN) 8 MG tablet Take 1 tablet (8 mg total) by mouth every 8 (eight) hours as needed for nausea or vomiting. Start 72 hours after IV chemotherapy treatment (Patient not taking: No sig reported)    OVER THE COUNTER MEDICATION Place 1 drop into both eyes daily as needed (dry eyes).    OVER THE COUNTER MEDICATION Take 3 tablets by mouth daily. Total Health by Dr. Ardeth Perfect (Patient not taking: Reported on 06/02/2021) 02/26/2021: "Total restore"   polyethylene glycol (MIRALAX) 17 g packet Take 17 g by mouth daily as needed for mild constipation. (Patient not taking: Reported on 06/02/2021)    prochlorperazine (COMPAZINE) 10 MG tablet Take 1 tablet (10 mg total) by mouth every 6 (six) hours as needed for nausea. (Patient not taking: No sig reported)    traMADol (ULTRAM) 50 MG tablet Take 1 tablet (50 mg total) by mouth every 6 (six) hours as needed for moderate pain. (Patient not taking: Reported on 06/02/2021) 06/02/2021: Says it "puts me in the hospital"   Turmeric 500 MG CAPS Take 500 mg by mouth daily. (Patient not taking: Reported on 06/02/2021)  No facility-administered encounter medications on file as of 06/03/2021.   Anxiety (Goal: minimize symptoms) -Not ideally controlled -Current treatment  Lorazepem 0.5 mg as needed  Pain (Goal: minimize pain) -Uncontrolled -Current treatment  Tramadol 50 mg 1 tablet as needed Tylenol 500 mg 2 tablets daily   Notes: Patient reports she has not  been feeling her best, she reports she had chemo on yesterday. Patient reports for her Lorazepam she has been needing to use it more regularly every day and she just got a refill for it on yesterday. She reports for pain she will not use her Tramadol and has not been using. She is using the max of 3000 of tylenol daily. Offered a new follow up appointment with the Pharmacist on 09-01-2021 as her prior appointment on 09-02-21 is on a Thursday and she will be in a different office. Patient ok with the change.  Care Gaps: Zoster Vaccine - Overdue COVID Booster #4 Therapist, music) - Overdue Colonoscopy - Overdue AWV- done 12-10-2020 CCM  /U Call - 09-01-2021  Star Rating Drugs: None  Ned Clines Solomon Clinical Pharmacist Assistant [phone]

## 2021-06-04 ENCOUNTER — Inpatient Hospital Stay: Payer: Medicare HMO

## 2021-06-04 ENCOUNTER — Encounter (HOSPITAL_BASED_OUTPATIENT_CLINIC_OR_DEPARTMENT_OTHER): Payer: Self-pay

## 2021-06-04 ENCOUNTER — Other Ambulatory Visit: Payer: Self-pay

## 2021-06-04 ENCOUNTER — Emergency Department (HOSPITAL_BASED_OUTPATIENT_CLINIC_OR_DEPARTMENT_OTHER)
Admission: EM | Admit: 2021-06-04 | Discharge: 2021-06-05 | Disposition: A | Discharge status: Home / Self Care | Payer: Medicare HMO | Attending: Emergency Medicine | Admitting: Emergency Medicine

## 2021-06-04 ENCOUNTER — Emergency Department (HOSPITAL_BASED_OUTPATIENT_CLINIC_OR_DEPARTMENT_OTHER): Payer: Medicare HMO

## 2021-06-04 ENCOUNTER — Ambulatory Visit: Payer: Medicare HMO

## 2021-06-04 DIAGNOSIS — K627 Radiation proctitis: Secondary | ICD-10-CM

## 2021-06-04 DIAGNOSIS — Z87891 Personal history of nicotine dependence: Secondary | ICD-10-CM | POA: Insufficient documentation

## 2021-06-04 DIAGNOSIS — Z8544 Personal history of malignant neoplasm of other female genital organs: Secondary | ICD-10-CM | POA: Diagnosis not present

## 2021-06-04 DIAGNOSIS — N1330 Unspecified hydronephrosis: Secondary | ICD-10-CM | POA: Diagnosis not present

## 2021-06-04 DIAGNOSIS — R1030 Lower abdominal pain, unspecified: Secondary | ICD-10-CM | POA: Diagnosis present

## 2021-06-04 DIAGNOSIS — W908XXA Exposure to other nonionizing radiation, initial encounter: Secondary | ICD-10-CM | POA: Diagnosis not present

## 2021-06-04 DIAGNOSIS — K5902 Outlet dysfunction constipation: Secondary | ICD-10-CM

## 2021-06-04 DIAGNOSIS — N134 Hydroureter: Secondary | ICD-10-CM | POA: Diagnosis not present

## 2021-06-04 DIAGNOSIS — Z85048 Personal history of other malignant neoplasm of rectum, rectosigmoid junction, and anus: Secondary | ICD-10-CM | POA: Insufficient documentation

## 2021-06-04 DIAGNOSIS — K5900 Constipation, unspecified: Secondary | ICD-10-CM | POA: Diagnosis not present

## 2021-06-04 DIAGNOSIS — Z79899 Other long term (current) drug therapy: Secondary | ICD-10-CM | POA: Diagnosis not present

## 2021-06-04 DIAGNOSIS — E039 Hypothyroidism, unspecified: Secondary | ICD-10-CM | POA: Insufficient documentation

## 2021-06-04 LAB — CBC WITH DIFFERENTIAL/PLATELET
Abs Immature Granulocytes: 0.01 10*3/uL (ref 0.00–0.07)
Basophils Absolute: 0 10*3/uL (ref 0.0–0.1)
Basophils Relative: 0 %
Eosinophils Absolute: 0 10*3/uL (ref 0.0–0.5)
Eosinophils Relative: 1 %
HCT: 32.4 % — ABNORMAL LOW (ref 36.0–46.0)
Hemoglobin: 10.1 g/dL — ABNORMAL LOW (ref 12.0–15.0)
Immature Granulocytes: 0 %
Lymphocytes Relative: 4 %
Lymphs Abs: 0.3 10*3/uL — ABNORMAL LOW (ref 0.7–4.0)
MCH: 28.2 pg (ref 26.0–34.0)
MCHC: 31.2 g/dL (ref 30.0–36.0)
MCV: 90.5 fL (ref 80.0–100.0)
Monocytes Absolute: 0.3 10*3/uL (ref 0.1–1.0)
Monocytes Relative: 4 %
Neutro Abs: 6 10*3/uL (ref 1.7–7.7)
Neutrophils Relative %: 91 %
Platelets: 143 10*3/uL — ABNORMAL LOW (ref 150–400)
RBC: 3.58 MIL/uL — ABNORMAL LOW (ref 3.87–5.11)
RDW: 18 % — ABNORMAL HIGH (ref 11.5–15.5)
WBC: 6.6 10*3/uL (ref 4.0–10.5)
nRBC: 0 % (ref 0.0–0.2)

## 2021-06-04 LAB — COMPREHENSIVE METABOLIC PANEL
ALT: 14 U/L (ref 0–44)
AST: 25 U/L (ref 15–41)
Albumin: 4 g/dL (ref 3.5–5.0)
Alkaline Phosphatase: 66 U/L (ref 38–126)
Anion gap: 8 (ref 5–15)
BUN: 23 mg/dL (ref 8–23)
CO2: 26 mmol/L (ref 22–32)
Calcium: 9.8 mg/dL (ref 8.9–10.3)
Chloride: 102 mmol/L (ref 98–111)
Creatinine, Ser: 0.85 mg/dL (ref 0.44–1.00)
GFR, Estimated: >60 mL/min (ref >60)
Glucose, Bld: 107 mg/dL — ABNORMAL HIGH (ref 70–99)
Potassium: 4.8 mmol/L (ref 3.5–5.1)
Sodium: 136 mmol/L (ref 135–145)
Total Bilirubin: 0.3 mg/dL (ref 0.3–1.2)
Total Protein: 7.1 g/dL (ref 6.5–8.1)

## 2021-06-04 LAB — LIPASE, BLOOD: Lipase: <10 U/L — ABNORMAL LOW (ref 11–51)

## 2021-06-04 MED ORDER — IOHEXOL 350 MG/ML SOLN
75.0000 mL | Freq: Once | INTRAVENOUS | Status: AC | PRN
  Administered 2021-06-04: 75 mL via INTRAVENOUS

## 2021-06-04 MED ORDER — FLEET ENEMA 7-19 GM/118ML RE ENEM
1.0000 | ENEMA | Freq: Once | RECTAL | Status: AC
  Administered 2021-06-05: 1 via RECTAL
  Filled 2021-06-04: qty 1

## 2021-06-04 MED ORDER — HYDROMORPHONE HCL 1 MG/ML IJ SOLN
1.0000 mg | Freq: Once | INTRAMUSCULAR | Status: AC
Start: 2021-06-04 — End: 2021-06-04
  Administered 2021-06-04: 1 mg via INTRAVENOUS
  Filled 2021-06-04: qty 1

## 2021-06-04 MED ORDER — SODIUM CHLORIDE 0.9 % IV BOLUS
1000.0000 mL | Freq: Once | INTRAVENOUS | Status: AC
  Administered 2021-06-04: 1000 mL via INTRAVENOUS

## 2021-06-04 MED ORDER — ONDANSETRON HCL 4 MG/2ML IJ SOLN
4.0000 mg | Freq: Once | INTRAMUSCULAR | Status: AC
  Administered 2021-06-04: 4 mg via INTRAVENOUS
  Filled 2021-06-04: qty 2

## 2021-06-04 NOTE — ED Triage Notes (Signed)
Pt c/o lower abd pain -  pt is chemo for anal cancer. LBM was 2 days and is on stool softener  Denies fever  -  was recently admitted at [facility].

## 2021-06-04 NOTE — Discharge Instructions (Signed)
Take 8 scoops of miralax in 32oz of whatever you would like to drink.(Gatorade comes in this size) You can also use a fleets enema which you can buy over the counter at the pharmacy.  Return for worsening abdominal pain, vomiting or fever. ? ?

## 2021-06-04 NOTE — ED Provider Notes (Signed)
[facility] EMERGENCY DEPT Provider Note   CSN: [id number] Arrival date & time: 06/04/21  2005     History Chief Complaint  Patient presents with   Abdominal Pain    Ashley Moore is a 70 y.o. female.  70 yo F with a chief complaints of lower abdominal pain.  Worsening over the past day or so.  Lower colicky.  Some constipation with this no significant bowel movement since chemotherapy on Wednesday.  Has a history of rectal cancer.  Had symptoms like this previously that required her to come into the hospital.  Nausea but no vomiting.  Has had some hematuria which the urologist told her would likely be ongoing for some months.  The history is provided by the patient.  Abdominal Pain Pain location:  Suprapubic Pain quality: aching and cramping   Pain radiates to:  Does not radiate Pain severity:  Severe Onset quality:  Gradual Duration:  1 day Timing:  Intermittent Progression:  Waxing and waning Chronicity:  Recurrent Relieved by:  Nothing Worsened by:  Nothing Ineffective treatments:  None tried Associated symptoms: constipation   Associated symptoms: no chest pain, no chills, no dysuria, no fever, no nausea, no shortness of breath and no vomiting       Past Medical History:  Diagnosis Date   Arthritis    Bowen's disease    excised 1992   Chronic low back pain    Chronic radiation cystitis    DDD (degenerative disc disease), cervical    Family history of breast cancer    Family history of lung cancer    Family history of non-Hodgkin's lymphoma    Family history of ovarian cancer    Headache    Hx of varicella    Hydronephrosis of right kidney    urologist--- dr Tresa Moore,  malignant,  treated with ureter stent   Hypothyroidism    followed by pcp   Lower urinary tract symptoms (LUTS)    01-12-2021  per pt treated with accupuncture at dr Tresa Moore office   Lymphedema of both lower extremities    PICC (peripherally inserted central catheter) in  place 01/11/2021   placed at [facility] in [place], [place] for IV antibiotics   Positive blood culture 01/08/2021   Klebsiella Pneumaniae,  treated with daily IV antibiotic   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[facility]) oncologist--- dr Benay Spice   dx 02/ 2018,  invasive SCC , completed chemo/ radiation 12-23-2016;  recurrent metastatsis retroperitoneal lymphdenopathy,  completed radiation 04-13-2020 residual right ureteral uropathy obstruction   Retroperitoneal lymphadenopathy    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   Sepsis due to Klebsiella pneumoniae ([place]) 01/07/2021   pt admitted to [facility] in [place],  dx sepsis secondary to acute pyelonephritis with bacteremia , positive blood culture,  discharged 01-11-2021 home daily IV antibiotic   Wears glasses     Patient Active Problem List   Diagnosis Date Noted   Intractable abdominal pain 05/16/2021   Acute pyelonephritis 05/16/2021   Acute cystitis without hematuria 05/16/2021   Hydronephrosis of right kidney 05/16/2021   Obstructive uropathy 05/16/2021   Hypothyroidism 05/16/2021   Hyponatremia 05/16/2021   Obesity (BMI 30-39.9) 05/16/2021   Goals of care, counseling/discussion 03/01/2021   SIRS (systemic inflammatory response syndrome) ([place]) 01/13/2021   Pyelonephritis 01/13/2021   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   Genetic testing 01/27/2020   Family history of breast cancer    Family history of lung cancer  Family history of ovarian cancer    Family history of non-Hodgkin's lymphoma    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   Port catheter in place 11/21/2016   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   Anal cancer ([place]) 11/03/2016   Cystitis 05/06/2015   Degenerative cervical disc 04/08/2015   Trigger point of neck 03/26/2015   Pain in joint, ankle and foot 04/23/2013   Visit for preventive health examination 02/13/2013   Routine gynecological  examination 02/13/2013   Family history of breast cancer in first degree relative 02/13/2013   Rash and nonspecific skin eruption 02/13/2013   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   VITAMIN D DEFICIENCY 06/25/2008   NECK PAIN 06/02/2008   FOOT PAIN 06/02/2008   Osteopenia 06/02/2008    Past Surgical History:  Procedure Laterality Date   BUNIONECTOMY Right yrs ago   CYSTOSCOPY W/ URETERAL STENT PLACEMENT Right 06/12/2020   Procedure: CYSTOSCOPY WITH RETROGRADE PYELOGRAM/URETERAL STENT EXCHANGE;  Surgeon: Alexis Frock, MD;  Location: [facility];  Service: Urology;  Laterality: Right;   CYSTOSCOPY W/ URETERAL STENT PLACEMENT Right 01/13/2021   Procedure: CYSTOSCOPY WITH RETROGRADE PYELOGRAM/URETERAL STENT REMOVALRIGHT;  Surgeon: Alexis Frock, MD;  Location: [facility];  Service: Urology;  Laterality: Right;  [place], URETEROSCOPY AND STENT PLACEMENT Right 01/28/2020   Procedure: CYSTOSCOPY WITH RETROGRADE PYELOGRAM, URETEROSCOPY AND STENT PLACEMENT;  Surgeon: Alexis Frock, MD;  Location: [facility];  Service: Urology;  Laterality: Right;   IR GENERIC HISTORICAL  11/14/2016   IR US GUIDE VASC ACCESS RIGHT 11/14/2016 [facility]   IR GENERIC HISTORICAL  11/14/2016   IR FLUORO GUIDE CV LINE RIGHT 11/14/2016 [facility]   IR GENERIC HISTORICAL  12/12/2016   IR US GUIDE VASC ACCESS RIGHT 12/12/2016 [name], MD [facility]   IR GENERIC HISTORICAL  12/12/2016   IR FLUORO GUIDE CV LINE RIGHT 12/12/2016 [name], MD [facility]   SKIN CANCER EXCISION  1990   bowens disease     OB History   No obstetric history on file.    Obstetric Comments  Last mammogram: 09/2018, BIRADS 1 Bone density: 07/2018 Last pap: 2018 - negative, HPV +         Family History  Problem Relation Age of Onset   Lung cancer Mother        lung   Breast cancer Mother 52   Ovarian cancer Mother 48   Pancreatitis Father    Breast cancer Sister  65   Breast cancer Maternal Grandmother 81       breast    Breast cancer Maternal Aunt 75       breast   Melanoma Sister    Breast cancer Sister 39   Non-Hodgkin's lymphoma Paternal Grandmother     Social History   Tobacco Use   Smoking status: Former    Packs/day: 1.00    Years: 25.00    Pack years: 25.00    Types: Cigarettes    Quit date: 06/20/1995    Years since quitting: 25.9   Smokeless tobacco: Never  Vaping Use   Vaping Use: Never used  Substance Use Topics   Alcohol use: Not Currently    Alcohol/week: 0.0 standard drinks    Comment: occasional wine   Drug use: Yes    Types: Marijuana    Comment: 01-12-2021  per pt once a week    Home Medications Prior to Admission medications   Medication Sig Start Date End  Date Taking? Authorizing Provider  acetaminophen (TYLENOL) 500 MG tablet Take 1,000 mg by mouth every 8 (eight) hours as needed for moderate pain.    [provider]  calcium gluconate 500 MG tablet Take 1 tablet by mouth 3 (three) times daily. Patient not taking: Reported on 06/02/2021    [provider]  Cholecalciferol (VITAMIN D3 PO) Take 2,000 Units by mouth daily.    [provider]  conjugated estrogens (PREMARIN) vaginal cream Place 1 Applicatorful vaginally daily.    [provider]  diclofenac Sodium (VOLTAREN) 1 % GEL Apply 2 g topically daily. To shoulder 03/04/21   [provider]  levothyroxine (SYNTHROID) 50 MCG tablet TAKE 1 TABLET EVERY DAY (NEED MD APPOINTMENT FOR REFILLS) 06/01/21   Panosh, Standley Brooking, MD  LORazepam (ATIVAN) 0.5 MG tablet Take 1 tablet (0.5 mg total) by mouth every 12 (twelve) hours as needed for anxiety. 06/02/21   [name], NP  Misc Natural Products [facility] URINARY HEALTH PO) Take 1 capsule by mouth daily. Patient not taking: Reported on 06/02/2021    [provider]  Multiple Vitamins-Minerals (MULTIVITAMIN ADULT) CHEW Chew 1 capsule by mouth daily.    [provider]  MYRBETRIQ 50 MG TB24 tablet Take 50 mg by mouth daily. 01/19/21   [provider]  ondansetron (ZOFRAN) 8 MG tablet Take 1 tablet (8 mg total) by mouth every 8 (eight) hours as needed for nausea or vomiting. Start 72 hours after IV chemotherapy treatment Patient not taking: No sig reported 03/04/21   [name], MD  OVER THE COUNTER MEDICATION Place 1 drop into both eyes daily as needed (dry eyes).    [provider]  OVER THE COUNTER MEDICATION Take 3 tablets by mouth daily. Total Health by Dr. Ardeth Perfect Patient not taking: Reported on 06/02/2021    [provider]  polyethylene glycol (MIRALAX) 17 g packet Take 17 g by mouth daily as needed for mild constipation. Patient not taking: Reported on 06/02/2021 05/18/21   Aline August, MD  prochlorperazine (COMPAZINE) 10 MG tablet Take 1 tablet (10 mg total) by mouth every 6 (six) hours as needed for nausea. Patient not taking: No sig reported 03/04/21   [name], MD  traMADol (ULTRAM) 50 MG tablet Take 1 tablet (50 mg total) by mouth every 6 (six) hours as needed for moderate pain. Patient not taking: Reported on 06/02/2021 05/18/21   Aline August, MD  Turmeric 500 MG CAPS Take 500 mg by mouth daily. Patient not taking: Reported on 06/02/2021    [provider]    Allergies    Benadryl [diphenhydramine], Citrus, Melatonin, Other, Lemon oil, and Penicillins  Review of Systems   Review of Systems  Constitutional:  Negative for chills and fever.  HENT:  Negative for congestion and rhinorrhea.   Eyes:  Negative for redness and visual disturbance.  Respiratory:  Negative for shortness of breath and wheezing.   Cardiovascular:  Negative for chest pain and palpitations.  Gastrointestinal:  Positive for abdominal pain and constipation. Negative for nausea and vomiting.  Genitourinary:  Negative for dysuria and urgency.  Musculoskeletal:  Negative for arthralgias and myalgias.  Skin:   Negative for pallor and wound.  Neurological:  Negative for dizziness and headaches.   Physical Exam Updated Vital Signs BP 124/82 (BP Location: Right Arm)   Pulse 97   Temp 98.3 F (36.8 C) (Oral)   Resp 18   Ht '5\' 2"'$  (1.575 m)   Wt  75 kg   SpO2 100%   BMI 30.24 kg/m   Physical Exam Vitals and nursing note reviewed.  Constitutional:      General: She is not in acute distress.    Appearance: She is well-developed. She is not diaphoretic.  HENT:     Head: Normocephalic and atraumatic.  Eyes:     Pupils: Pupils are equal, round, and reactive to light.  Cardiovascular:     Rate and Rhythm: Normal rate and regular rhythm.     Heart sounds: No murmur heard.   No friction rub. No gallop.  Pulmonary:     Effort: Pulmonary effort is normal.     Breath sounds: No wheezing or rales.  Abdominal:     General: There is no distension.     Palpations: Abdomen is soft.     Tenderness: There is abdominal tenderness (worst to the suprapubic region).  Musculoskeletal:        General: No tenderness.     Cervical back: Normal range of motion and neck supple.  Skin:    General: Skin is warm and dry.  Neurological:     Mental Status: She is alert and oriented to person, place, and time.  Psychiatric:        Behavior: Behavior normal.    ED Results / Procedures / Treatments   Labs (all labs ordered are listed, but only abnormal results are displayed) Labs Reviewed  CBC WITH DIFFERENTIAL/PLATELET  COMPREHENSIVE METABOLIC PANEL  LIPASE, BLOOD  URINALYSIS, ROUTINE W REFLEX MICROSCOPIC    EKG None  Radiology No results found.  Procedures Procedures   Medications Ordered in ED Medications  sodium chloride 0.9 % bolus 1,000 mL (has no administration in time range)  HYDROmorphone (DILAUDID) injection 1 mg (has no administration in time range)  ondansetron (ZOFRAN) injection 4 mg (has no administration in time range)    ED Course  I have reviewed the triage vital signs and  the nursing notes.  Pertinent labs & imaging results that were available during my care of the patient were reviewed by me and considered in my medical decision making (see chart for details).    MDM Rules/Calculators/A&P                           70 yo F with a chief complaints of crampy lower abdominal pain.  Going on since this morning.  Patient feels constipated has been trying over-the-counter laxatives without improvement.  Patient had symptoms previous to this and was admitted to the hospital.  Old records were reviewed and the patient had significant right-sided hydronephrosis due to malignant obstruction.  Her symptoms were improved with improvement of her constipation.  Had a urinary tract infection concomitantly that was treated with antibiotics.  Patient's CT scan consistent with prior visits for the same.  As this has improved previously with bowel cleanout I discussed different options.  Patient electing to try an enema here.  Patient care signed out to Dr. Florina Ou, please see his note for further details care in the ED.  The patients results and plan were reviewed and discussed.   Any x-rays performed were independently reviewed by myself.   Differential diagnosis were considered with the presenting HPI.  Medications  sodium chloride 0.9 % bolus 1,000 mL (1,000 mLs Intravenous New Bag/Given 06/04/21 2130)  HYDROmorphone (DILAUDID) injection 1 mg (1 mg Intravenous Given 06/04/21 2131)  ondansetron (ZOFRAN) injection 4 mg (4 mg Intravenous Given 06/04/21  2131)  iohexol (OMNIPAQUE) 350 MG/ML injection 75 mL (75 mLs Intravenous Contrast Given 06/04/21 2214)  sodium phosphate (FLEET) 7-19 GM/118ML enema 1 enema (1 enema Rectal Given 06/05/21 0002)  lidocaine (XYLOCAINE) 2 % jelly 1 application (1 application Topical Given 06/05/21 0206)    Vitals:   06/05/21 0045 06/05/21 0115 06/05/21 0200 06/05/21 0325  BP: 133/81 129/73  122/86  Pulse: 97 86 81 (!) 106  Resp: '17 17  20  '$ Temp:     98.3 F (36.8 C)  TempSrc:    Oral  SpO2: 100% 100% 100% 100%  Weight:      Height:        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

## 2021-06-05 MED ORDER — LIDOCAINE HCL URETHRAL/MUCOSAL 2 % EX GEL
1.0000 "application " | Freq: Once | CUTANEOUS | Status: AC
  Administered 2021-06-05: 1 via TOPICAL
  Filled 2021-06-05: qty 11

## 2021-06-05 NOTE — ED Provider Notes (Signed)
Patient was unable to tolerate a soapsuds enema and did not have significant passage of stool.  She was offered admission but she would prefer to go home and start home MiraLAX.     Kataya Guimont, Jenny Reichmann, MD 06/05/21 [phone]

## 2021-06-07 ENCOUNTER — Telehealth: Payer: Self-pay | Admitting: Physician Assistant

## 2021-06-07 ENCOUNTER — Other Ambulatory Visit: Payer: Self-pay

## 2021-06-07 ENCOUNTER — Telehealth: Payer: Self-pay

## 2021-06-07 ENCOUNTER — Inpatient Hospital Stay: Payer: Medicare HMO | Admitting: Nurse Practitioner

## 2021-06-07 VITALS — BP 146/90 | HR 106 | Temp 97.8°F | Resp 18 | Ht 62.0 in | Wt 165.0 lb

## 2021-06-07 DIAGNOSIS — Z5111 Encounter for antineoplastic chemotherapy: Secondary | ICD-10-CM | POA: Diagnosis not present

## 2021-06-07 DIAGNOSIS — C772 Secondary and unspecified malignant neoplasm of intra-abdominal lymph nodes: Secondary | ICD-10-CM | POA: Diagnosis not present

## 2021-06-07 DIAGNOSIS — C210 Malignant neoplasm of anus, unspecified: Secondary | ICD-10-CM | POA: Diagnosis not present

## 2021-06-07 NOTE — Telephone Encounter (Signed)
Spoke iwth pt in regards to constipation issues over the weekend.   She was seen in ED and recived enemas with good reuslts. Feels better now.

## 2021-06-07 NOTE — Telephone Encounter (Signed)
Patient called. She would like a referral to start back with physical therapy here at [facility]. Her call back number is [phone]

## 2021-06-07 NOTE — Progress Notes (Signed)
[place] OFFICE PROGRESS NOTE   Diagnosis: Anal cancer  INTERVAL HISTORY:   Ms. Guion returns as scheduled.  She completed cycle 5 Taxol/carboplatin 06/02/2021.  She was seen in the emergency department 06/04/2021/06/05/2021 due to constipation.  She reports having 2 enemas while in the emergency department.  Bowels now moving.  She had a single episode of nausea with the constipation.  She reports pain related to a right rotator cuff injury and plans to resume physical therapy.  Objective:  Vital signs in last 24 hours:  Blood pressure (!) 146/90, pulse (!) 106, temperature 97.8 F (36.6 C), temperature source Oral, resp. rate 18, height 5\' 2"  (1.575 m), weight 165 lb (74.8 kg), SpO2 100 %.    HEENT: No thrush or ulcers. Resp: Lungs clear bilaterally. Cardio: Regular rate and rhythm. GI: Abdomen soft and nontender.  No hepatomegaly. Vascular: Pitting edema both lower legs.   Lab Results:  Lab Results  Component Value Date   WBC 6.6 06/04/2021   HGB 10.1 (L) 06/04/2021   HCT 32.4 (L) 06/04/2021   MCV 90.5 06/04/2021   PLT 143 (L) 06/04/2021   NEUTROABS 6.0 06/04/2021    Imaging:  No results found.  Medications: I have reviewed the patient's current medications.  Assessment/Plan:  Anal cancer CT abdomen/pelvis 10/21/2016-thickening of the anus extending to the junction between the anus and rectum with a large stool ball in the rectum and mild fat stranding posterior to the rectum. Enlarged lymph nodes in the right and left inguinal regions. A few mildly prominent nodes seen posterior to the rectum. 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 PET scan [id number] hypermetabolic anal mass with hypermetabolic metastases to the groin region bilaterally, left pelvic sidewall and presacral space. Initiation of radiation and cycle 1 5-FU/mitomycin C 11/14/2016 Cycle 2 5-FU/mitomycin C 12/12/2016 (5-FU dose reduced due to  mucositis, diarrhea, skin breakdown) Radiation completed 12/23/2016 CT abdomen/pelvis 04/14/2017-resolution of anal mass and bilateral inguinal lymphadenopathy. No residual tumor seen. CT abdomen/pelvis 01/28/2020-right hydroureteronephrosis, transition at the level of the mid right ureter, retroperitoneal lymphadenopathy CT renal stone study 01/28/2020-severe right hydronephrosis and proximal right hydroureter, 8 mm area of increased attenuation in the proximal right ureter-stone versus soft tissue mass PET 02/13/2020-hypermetabolic right retroperitoneal nodal metastases in the aortocaval and posterior pericaval chains, nonspecific mild anal wall hypermetabolism, right nephroureteral stent 02/21/2020 anorectal exam per Dr. Ashley Jacobs radiation changes of the skin around the anal margin.  Posterior midline smooth scarring.  Mildly stenotic.  Stenosis seems better.  No palpable concerning lesions.  Entire anal canal and distal rectum feels smooth and healthy.  Partial anoscopy performed with no lesions in the anal canal. Xeloda/radiation beginning 03/02/2020, completed 04/13/2020 CT abdomen/pelvis 06/17/2020-resolution of right retroperitoneal lymphadenopathy, no remaining pathologically enlarged lymph nodes, residual mild right hydronephrosis, no evidence of progressive disease, stable anal wall thickening Digital rectal exam by Dr. Quentin Cornwall 08/26/2020-posterior midline smooth scarring.  Mildly stenotic.  Stenosis seems better.  No palpable concerning lesions.  Anal canal feels smooth without palpable concern.  Unable to tolerate anoscopy.  Next visit in 6 months for surveillance. CT abdomen/pelvis 10/28/2020-no evidence of local recurrence or metastatic disease.  Stable mild rectal wall thickening.  New diffuse bladder wall thickening possibly related to prior radiation CT abdomen/pelvis 01/16/2021- recurrent right-sided hydronephrosis and proximal right hydroureter status post removal of right ureter stent,  new soft tissue nodule along the course of the right ureter between the right psoas and IVC suspicious for recurrent lymphadenopathy PET 02/12/2021-small  focus of hypermetabolism at the anorectal junction without a definable mass, right retroperitoneal nodal metastasis with obstruction of the proximal right ureter, right middle lobe hypermetabolic nodule, hypermetabolism in the right hilum without a defined nodal mass, hypermetabolic left supraclavicular and left axillary nodes 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 Question direct extension from the anal cancer versus metastatic disease from anal cancer versus a separate malignant process. 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 Multiple family members with breast cancer. History of hypokalemia-likely secondary to decreased nutritional intake and diarrhea; potassium in normal range 01/16/2017. No longer taking a potassium supplement. Right hydroureteronephrosis on CT 01/28/2020, status post a cystoscopy/pyelogram 01/28/2020 confirming extrinsic compression of the right ureter, status post stent placement Right ureter stent exchange 06/12/2020 Right ureter stent removed 01/13/2021   8.  Admission 01/07/2021 with Klebsiella pneumoniae urosepsis/bacteremia  9.  Hospital  admission 05/15/2021 with UTI/possible right pyelonephritis    Disposition: Ms. Vanduzer appears unchanged.  She completed cycle 5 Taxol/carboplatin 06/02/2021.  She again developed constipation prompting a visit to the emergency department.  CT scan in the emergency department was negative for bowel obstruction.  There was a large volume of stool throughout the colon.  Right retroperitoneal mass stable to mildly decreased in size.  Bowels are now moving regularly.  She will return for follow-up 06/23/2021.  We are available to see her sooner if needed.    Ned Card ANP/GNP-BC   06/07/2021  1:32 PM

## 2021-06-08 ENCOUNTER — Other Ambulatory Visit: Payer: Self-pay

## 2021-06-08 ENCOUNTER — Encounter: Payer: Self-pay | Admitting: Internal Medicine

## 2021-06-08 ENCOUNTER — Ambulatory Visit (INDEPENDENT_AMBULATORY_CARE_PROVIDER_SITE_OTHER): Payer: Medicare HMO | Admitting: Internal Medicine

## 2021-06-08 VITALS — BP 122/72 | HR 104 | Temp 98.2°F | Ht 62.0 in | Wt 165.0 lb

## 2021-06-08 DIAGNOSIS — C210 Malignant neoplasm of anus, unspecified: Secondary | ICD-10-CM

## 2021-06-08 DIAGNOSIS — C772 Secondary and unspecified malignant neoplasm of intra-abdominal lymph nodes: Secondary | ICD-10-CM | POA: Diagnosis not present

## 2021-06-08 DIAGNOSIS — E039 Hypothyroidism, unspecified: Secondary | ICD-10-CM

## 2021-06-08 DIAGNOSIS — K5900 Constipation, unspecified: Secondary | ICD-10-CM | POA: Diagnosis not present

## 2021-06-08 DIAGNOSIS — Z79899 Other long term (current) drug therapy: Secondary | ICD-10-CM | POA: Diagnosis not present

## 2021-06-08 NOTE — Progress Notes (Signed)
Chief Complaint  Patient presents with   Hospitalization Follow-up    HPI: Ashley Moore 70 y.o. come in for fu ed  for constipation she feels related to her cancer diagnosis and her chemotherapy She ended up in the emergency room and had multiple MIs enemas including the last [place] soap enema. Had vomiting x1 and had diarrhea type stool no active bleeding. Took Imodium after the [place] enema.  But only once.  September 18 Sunday and then had loose stools no obvious blood Is on chemotherapy 1 more to go tends to get constipation with this. She now knows may be how to prepare for this She did have a recent UTI.  No active bleeding.  She restarted her thyroid medicine at the end of August.   ROS: See pertinent positives and negatives per HPI.  Past Medical History:  Diagnosis Date   Arthritis    Bowen's disease    excised 1992   Chronic low back pain    Chronic radiation cystitis    DDD (degenerative disc disease), cervical    Family history of breast cancer    Family history of lung cancer    Family history of non-Hodgkin's lymphoma    Family history of ovarian cancer    Headache    Hx of varicella    Hydronephrosis of right kidney    urologist--- dr Tresa Moore,  malignant,  treated with ureter stent   Hypothyroidism    followed by pcp   Lower urinary tract symptoms (LUTS)    01-12-2021  per pt treated with accupuncture at dr Tresa Moore office   Lymphedema of both lower extremities    PICC (peripherally inserted central catheter) in place 01/11/2021   placed at [facility] in [place], [place] for IV antibiotics   Positive blood culture 01/08/2021   Klebsiella Pneumaniae,  treated with daily IV antibiotic   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[facility]) oncologist--- dr Benay Spice   dx 02/ 2018,  invasive SCC , completed chemo/ radiation 12-23-2016;  recurrent metastatsis retroperitoneal lymphdenopathy,  completed radiation 04-13-2020 residual right ureteral uropathy obstruction    Retroperitoneal lymphadenopathy    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   Sepsis due to Klebsiella pneumoniae ([place]) 01/07/2021   pt admitted to [facility] in [place],  dx sepsis secondary to acute pyelonephritis with bacteremia , positive blood culture,  discharged 01-11-2021 home daily IV antibiotic   Wears glasses     Family History  Problem Relation Age of Onset   Lung cancer Mother        lung   Breast cancer Mother 64   Ovarian cancer Mother 80   Pancreatitis Father    Breast cancer Sister 77   Breast cancer Maternal Grandmother 82       breast    Breast cancer Maternal Aunt 75       breast   Melanoma Sister    Breast cancer Sister 78   Non-Hodgkin's lymphoma Paternal Grandmother     Social History   Socioeconomic History   Marital status: Widowed    Spouse name: Not on file   Number of children: 0   Years of education: 14   Highest education level: Associate degree: academic program  Occupational History   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   She is a former smoker   Retired Programmer, applications; Conservator, museum/gallery   etoh   Red wine  1 per night.    Tea green tea and earl gray    Moved from [place] to [place] area in 12-14-83   1 pregnancy   Husband died spring  2016 cv   Sister  died 03/16/2015  Bone cancer    3 remaining sisters            Social Determinants of Health   Financial Resource Strain: Low Risk    Difficulty of Paying Living Expenses: Not hard at all  Food Insecurity: No Food Insecurity   Worried About Charity fundraiser in the Last Year: Never true   Arboriculturist in the Last Year: Never true  Transportation Needs: No Transportation Needs   Lack of Transportation (Medical): No   Lack of Transportation (Non-Medical): No  Physical Activity: Sufficiently Active   Days of Exercise per Week: 5 days   Minutes of Exercise per Session: 30 min  Stress: No Stress Concern Present   Feeling of Stress : Not at all  Social Connections: Moderately Isolated   Frequency of Communication with Friends and Family: More than three times a week   Frequency of Social Gatherings with Friends and Family: More than three times a week   Attends Religious Services: 1 to 4 times per year   Active Member of [employer] or Organizations: No   Attends Archivist Meetings: Never   Marital Status: Widowed    Outpatient Medications Prior to Visit  Medication Sig Dispense Refill   acetaminophen (TYLENOL) 500 MG tablet Take 1,000 mg by mouth every 8 (eight) hours as needed for moderate pain.     Cholecalciferol (VITAMIN D3 PO) Take 2,000 Units by mouth daily.     conjugated estrogens (PREMARIN) vaginal cream Place 1 Applicatorful vaginally daily.     diclofenac Sodium (VOLTAREN) 1 % GEL Apply 2 g topically daily. To shoulder     levothyroxine (SYNTHROID) 50 MCG tablet TAKE 1 TABLET EVERY DAY (NEED MD APPOINTMENT FOR REFILLS) 90 tablet 0   loratadine-pseudoephedrine (CLARITIN-D 12 HOUR) 5-120 MG tablet Take 1 tablet by mouth 2 (two) times daily.     LORazepam (ATIVAN) 0.5 MG tablet Take 1 tablet (0.5 mg total) by mouth every 12 (twelve) hours as needed for anxiety. 60 tablet 0   Multiple Vitamins-Minerals (MULTIVITAMIN ADULT) CHEW Chew 1 capsule by mouth daily.      MYRBETRIQ 50 MG TB24 tablet Take 50 mg by mouth daily.     OVER THE COUNTER MEDICATION Place 1 drop into both eyes daily as needed (dry eyes).     polyethylene glycol (MIRALAX) 17 g packet Take 17 g by mouth daily as needed for mild constipation.  No facility-administered medications prior to visit.     EXAM:  BP 122/72 (BP Location: Left Arm, Patient Position: Sitting, Cuff Size: Normal)   Pulse (!) 104   Temp 98.2 F (36.8 C) (Oral)   Ht 5\' 2"  (1.575 m)   Wt 165 lb (74.8 kg)   SpO2 98%   BMI 30.18 kg/m   Body mass index is 30.18 kg/m. Wt Readings from Last 3 Encounters:  06/08/21 165 lb (74.8 kg)  06/07/21 165 lb (74.8 kg)  06/04/21 165 lb 5.5 oz (75 kg)    GENERAL: vitals reviewed and listed above, alert, oriented, appears well hydrated and in no acute distress HEENT: atraumatic, conjunctiva  clear, no obvious abnormalities on inspection of external nose and ears OP : Masked NECK: no obvious masses on inspection palpation  LUNGS: clear to auscultation bilaterally, no wheezes, rales or rhonchi, good air movement CV: HRRR, no clubbing cyanosis or  peripheral edema nl cap refill  Abdomen soft without again a megaly no obvious focal tenderness or masses. MS: moves all extremities without noticeable focal  abnormality given nonjaundiced no active bruising PSYCH: pleasant and cooperative, no obvious depression or anxiety Lab Results  Component Value Date   WBC 6.6 06/04/2021   HGB 10.1 (L) 06/04/2021   HCT 32.4 (L) 06/04/2021   PLT 143 (L) 06/04/2021   GLUCOSE 107 (H) 06/04/2021   CHOL 153 01/08/2020   TRIG 66.0 01/08/2020   HDL 76.30 01/08/2020   LDLCALC 63 01/08/2020   ALT 14 06/04/2021   AST 25 06/04/2021   NA 136 06/04/2021   K 4.8 06/04/2021   CL 102 06/04/2021   CREATININE 0.85 06/04/2021   BUN 23 06/04/2021   CO2 26 06/04/2021   TSH 7.850 (H) 04/21/2021   INR 1.2 05/16/2021   HGBA1C 5.5 05/11/2015   BP Readings from Last 3 Encounters:  06/08/21  122/72  06/07/21 (!) 146/90  06/05/21 122/86    ASSESSMENT AND PLAN:  Discussed the following assessment and plan:  Medication management - Plan: TSH, T4, free  Constipation, unspecified constipation type - related to chemo and mechanical condition  see ed visit better  now loose and took imodium x 1   Hypothyroidism, unspecified type - Plan: TSH, T4, free  Anal cancer (HCC) - Active chemotherapy.  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 Gearldine Shown or oncology's ongoing lab blood testing.  -Patient advised to return or notify health care team  if  new concerns arise.  Patient Instructions  Caution with imodium can cause constipation. Continue thyroid med    not enough can aggravate constipation.  Plan labs  end of October .  Will give dr Benay Spice a heads  if he is doing lab at that time.  Stay hydrated.     Standley Brooking. Almir Botts M.D.

## 2021-06-08 NOTE — Patient Instructions (Signed)
Caution with imodium can cause constipation. Continue thyroid med    not enough can aggravate constipation.  Plan labs  end of October .  Will give dr Benay Spice a heads  if he is doing lab at that time.  Stay hydrated.

## 2021-06-09 ENCOUNTER — Other Ambulatory Visit: Payer: Self-pay

## 2021-06-09 DIAGNOSIS — M79601 Pain in right arm: Secondary | ICD-10-CM

## 2021-06-09 NOTE — Telephone Encounter (Signed)
Ok per MD to enter referral for physical therapy of shoulder pt would like to proceed with this now.

## 2021-06-11 ENCOUNTER — Other Ambulatory Visit: Payer: Self-pay | Admitting: Family Medicine

## 2021-06-11 ENCOUNTER — Other Ambulatory Visit: Payer: Self-pay | Admitting: Nurse Practitioner

## 2021-06-11 DIAGNOSIS — E2839 Other primary ovarian failure: Secondary | ICD-10-CM

## 2021-06-11 DIAGNOSIS — C210 Malignant neoplasm of anus, unspecified: Secondary | ICD-10-CM

## 2021-06-16 ENCOUNTER — Ambulatory Visit
Admission: RE | Admit: 2021-06-16 | Discharge: 2021-06-16 | Disposition: A | Discharge status: Home / Self Care | Payer: Medicare HMO | Source: Ambulatory Visit | Attending: Family Medicine | Admitting: Family Medicine

## 2021-06-16 ENCOUNTER — Other Ambulatory Visit: Payer: Self-pay

## 2021-06-16 DIAGNOSIS — Z780 Asymptomatic menopausal state: Secondary | ICD-10-CM | POA: Diagnosis not present

## 2021-06-16 DIAGNOSIS — Z1231 Encounter for screening mammogram for malignant neoplasm of breast: Secondary | ICD-10-CM

## 2021-06-16 DIAGNOSIS — M810 Age-related osteoporosis without current pathological fracture: Secondary | ICD-10-CM | POA: Diagnosis not present

## 2021-06-16 DIAGNOSIS — E2839 Other primary ovarian failure: Secondary | ICD-10-CM

## 2021-06-16 DIAGNOSIS — C210 Malignant neoplasm of anus, unspecified: Secondary | ICD-10-CM | POA: Diagnosis not present

## 2021-06-20 ENCOUNTER — Other Ambulatory Visit: Payer: Self-pay | Admitting: Oncology

## 2021-06-23 ENCOUNTER — Inpatient Hospital Stay (HOSPITAL_BASED_OUTPATIENT_CLINIC_OR_DEPARTMENT_OTHER): Payer: Medicare HMO | Admitting: Oncology

## 2021-06-23 ENCOUNTER — Inpatient Hospital Stay: Payer: Medicare HMO

## 2021-06-23 ENCOUNTER — Other Ambulatory Visit: Payer: Self-pay

## 2021-06-23 ENCOUNTER — Inpatient Hospital Stay: Payer: Medicare HMO | Attending: Nurse Practitioner | Admitting: Nurse Practitioner

## 2021-06-23 ENCOUNTER — Telehealth: Payer: Self-pay

## 2021-06-23 VITALS — BP 145/83 | HR 107 | Temp 97.9°F | Resp 18 | Ht 62.0 in | Wt 167.2 lb

## 2021-06-23 DIAGNOSIS — C210 Malignant neoplasm of anus, unspecified: Secondary | ICD-10-CM | POA: Diagnosis not present

## 2021-06-23 DIAGNOSIS — E039 Hypothyroidism, unspecified: Secondary | ICD-10-CM

## 2021-06-23 DIAGNOSIS — Z5111 Encounter for antineoplastic chemotherapy: Secondary | ICD-10-CM | POA: Insufficient documentation

## 2021-06-23 DIAGNOSIS — D701 Agranulocytosis secondary to cancer chemotherapy: Secondary | ICD-10-CM | POA: Insufficient documentation

## 2021-06-23 DIAGNOSIS — Z79899 Other long term (current) drug therapy: Secondary | ICD-10-CM | POA: Insufficient documentation

## 2021-06-23 LAB — CMP (CANCER CENTER ONLY)
ALT: 10 U/L (ref 0–44)
AST: 23 U/L (ref 15–41)
Albumin: 4 g/dL (ref 3.5–5.0)
Alkaline Phosphatase: 74 U/L (ref 38–126)
Anion gap: 9 (ref 5–15)
BUN: 16 mg/dL (ref 8–23)
CO2: 23 mmol/L (ref 22–32)
Calcium: 9.6 mg/dL (ref 8.9–10.3)
Chloride: 104 mmol/L (ref 98–111)
Creatinine: 0.95 mg/dL (ref 0.44–1.00)
GFR, Estimated: >60 mL/min (ref >60)
Glucose, Bld: 93 mg/dL (ref 70–99)
Potassium: 4.2 mmol/L (ref 3.5–5.1)
Sodium: 136 mmol/L (ref 135–145)
Total Bilirubin: 0.3 mg/dL (ref 0.3–1.2)
Total Protein: 7.2 g/dL (ref 6.5–8.1)

## 2021-06-23 LAB — CBC WITH DIFFERENTIAL (CANCER CENTER ONLY)
Abs Immature Granulocytes: 0.01 10*3/uL (ref 0.00–0.07)
Basophils Absolute: 0 10*3/uL (ref 0.0–0.1)
Basophils Relative: 1 %
Eosinophils Absolute: 0 10*3/uL (ref 0.0–0.5)
Eosinophils Relative: 2 %
HCT: 29.4 % — ABNORMAL LOW (ref 36.0–46.0)
Hemoglobin: 9.4 g/dL — ABNORMAL LOW (ref 12.0–15.0)
Immature Granulocytes: 0 %
Lymphocytes Relative: 11 %
Lymphs Abs: 0.3 10*3/uL — ABNORMAL LOW (ref 0.7–4.0)
MCH: 29.1 pg (ref 26.0–34.0)
MCHC: 32 g/dL (ref 30.0–36.0)
MCV: 91 fL (ref 80.0–100.0)
Monocytes Absolute: 0.4 10*3/uL (ref 0.1–1.0)
Monocytes Relative: 18 %
Neutro Abs: 1.6 10*3/uL — ABNORMAL LOW (ref 1.7–7.7)
Neutrophils Relative %: 68 %
Platelet Count: 155 10*3/uL (ref 150–400)
RBC: 3.23 MIL/uL — ABNORMAL LOW (ref 3.87–5.11)
RDW: 16.6 % — ABNORMAL HIGH (ref 11.5–15.5)
WBC Count: 2.4 10*3/uL — ABNORMAL LOW (ref 4.0–10.5)
nRBC: 0 % (ref 0.0–0.2)

## 2021-06-23 LAB — T4, FREE: Free T4: 0.95 ng/dL (ref 0.61–1.12)

## 2021-06-23 LAB — TSH: TSH: 6.938 u[IU]/mL — ABNORMAL HIGH (ref 0.350–4.500)

## 2021-06-23 NOTE — Progress Notes (Signed)
[place] OFFICE PROGRESS NOTE   Diagnosis: Anal cancer  INTERVAL HISTORY:   Ashley Moore returns as scheduled.  She completed another cycle of Taxol/carboplatin on 06/02/2021.  She developed severe constipation and was seen in the emergency room on 06/04/2021.  She had an episode of vomiting the same day.  Her bowels are now moving.  She had no nausea immediately following chemotherapy.  No bone pain.  She is working.  She complains of numbness in the feet.  This has developed over the past several weeks.  No hand numbness.  Objective:  Vital signs in last 24 hours:  Blood pressure (!) 145/83, pulse (!) 107, temperature 97.9 F (36.6 C), temperature source Oral, resp. rate 18, height 5\' 2"  (1.575 m), weight 167 lb 3.2 oz (75.8 kg), SpO2 100 %.    HEENT: No thrush or ulcers Resp: Lungs clear bilaterally Cardio: Regular rate and rhythm GI: No hepatosplenomegaly, nontender Vascular: Trace pitting edema to lower leg bilaterally Neuro: Sensation is in tact to light touch at the soles bilaterally Lymph nodes: Mobile 1-2 cm left scalene and low left anterior cervical nodes, no inguinal nodes  Portacath/PICC-without erythema  Lab Results:  Lab Results  Component Value Date   WBC 2.4 (L) 06/23/2021   HGB 9.4 (L) 06/23/2021   HCT 29.4 (L) 06/23/2021   MCV 91.0 06/23/2021   PLT 155 06/23/2021   NEUTROABS 1.6 (L) 06/23/2021    CMP  Lab Results  Component Value Date   NA 136 06/04/2021   K 4.8 06/04/2021   CL 102 06/04/2021   CO2 26 06/04/2021   GLUCOSE 107 (H) 06/04/2021   BUN 23 06/04/2021   CREATININE 0.85 06/04/2021   CALCIUM 9.8 06/04/2021   PROT 7.1 06/04/2021   ALBUMIN 4.0 06/04/2021   AST 25 06/04/2021   ALT 14 06/04/2021   ALKPHOS 66 06/04/2021   BILITOT 0.3 06/04/2021   GFRNONAA >60 06/04/2021   GFRAA >60 06/17/2020    Lab Results  Component Value Date   CEA1 1.15 03/04/2021   CEA 1.16 03/04/2021     Medications: I have reviewed the  patient's current medications.   Assessment/Plan:  Anal cancer CT abdomen/pelvis 10/21/2016-thickening of the anus extending to the junction between the anus and rectum with a large stool ball in the rectum and mild fat stranding posterior to the rectum. Enlarged lymph nodes in the right and left inguinal regions. A few mildly prominent nodes seen posterior to the rectum. 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 PET scan [id number] hypermetabolic anal mass with hypermetabolic metastases to the groin region bilaterally, left pelvic sidewall and presacral space. Initiation of radiation and cycle 1 5-FU/mitomycin C 11/14/2016 Cycle 2 5-FU/mitomycin C 12/12/2016 (5-FU dose reduced due to mucositis, diarrhea, skin breakdown) Radiation completed 12/23/2016 CT abdomen/pelvis 04/14/2017-resolution of anal mass and bilateral inguinal lymphadenopathy. No residual tumor seen. CT abdomen/pelvis 01/28/2020-right hydroureteronephrosis, transition at the level of the mid right ureter, retroperitoneal lymphadenopathy CT renal stone study 01/28/2020-severe right hydronephrosis and proximal right hydroureter, 8 mm area of increased attenuation in the proximal right ureter-stone versus soft tissue mass PET 02/13/2020-hypermetabolic right retroperitoneal nodal metastases in the aortocaval and posterior pericaval chains, nonspecific mild anal wall hypermetabolism, right nephroureteral stent 02/21/2020 anorectal exam per Dr. Ashley Jacobs radiation changes of the skin around the anal margin.  Posterior midline smooth scarring.  Mildly stenotic.  Stenosis seems better.  No palpable concerning lesions.  Entire anal canal and distal rectum feels smooth and healthy.  Partial anoscopy performed with no lesions in the anal canal. Xeloda/radiation beginning 03/02/2020, completed 04/13/2020 CT abdomen/pelvis 06/17/2020-resolution of right retroperitoneal lymphadenopathy, no remaining pathologically  enlarged lymph nodes, residual mild right hydronephrosis, no evidence of progressive disease, stable anal wall thickening Digital rectal exam by Dr. Quentin Cornwall 08/26/2020-posterior midline smooth scarring.  Mildly stenotic.  Stenosis seems better.  No palpable concerning lesions.  Anal canal feels smooth without palpable concern.  Unable to tolerate anoscopy.  Next visit in 6 months for surveillance. CT abdomen/pelvis 10/28/2020-no evidence of local recurrence or metastatic disease.  Stable mild rectal wall thickening.  New diffuse bladder wall thickening possibly related to prior radiation CT abdomen/pelvis 01/16/2021- recurrent right-sided hydronephrosis and proximal right hydroureter status post removal of right ureter stent, new soft tissue nodule along the course of the right ureter between the right psoas and IVC suspicious for recurrent lymphadenopathy PET 02/12/2021-small focus of hypermetabolism at the anorectal junction without a definable mass, right retroperitoneal nodal metastasis with obstruction of the proximal right ureter, right middle lobe hypermetabolic nodule, hypermetabolism in the right hilum without a defined nodal mass, hypermetabolic left supraclavicular and left axillary nodes 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 Question direct extension from the anal cancer versus metastatic disease from anal cancer versus a  separate malignant process. 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 Multiple family members with breast cancer. History of hypokalemia-likely secondary to decreased nutritional intake and diarrhea; potassium in normal range 01/16/2017. No longer taking a potassium supplement. Right hydroureteronephrosis on CT 01/28/2020, status post a cystoscopy/pyelogram 01/28/2020 confirming extrinsic compression of the right ureter, status post stent placement Right ureter stent exchange 06/12/2020 Right ureter stent removed 01/13/2021   8.  Admission 01/07/2021 with Klebsiella pneumoniae urosepsis/bacteremia  9.  Hospital admission 05/15/2021 with UTI/possible right pyelonephritis      Disposition: Ms. Mergen has completed 5 cycles of Taxol/carboplatin.  Her overall clinical status appears stable, but she has developed numbness in the feet.  She has mild neutropenia today.  She does not wish to receive G-CSF.  We decided to hold chemotherapy today.  She will return for an office visit in the next cycle of chemotherapy 1 week.  We will decide on proceeding with Taxol/carboplatin versus single agent carboplatin based on her neuropathy symptoms next week.  The CT abdomen/pelvis 06/04/2021 revealed stable to a mild decrease in the right retroperitoneal mass.  She will undergo a restaging chest CT after 2 more cycles of chemotherapy.  Ashley Coder, MD  06/23/2021  9:22 AM

## 2021-06-23 NOTE — Telephone Encounter (Signed)
Patient seen by Dr. Benay Spice today  Vitals are within treatment parameters.  Labs reviewed by Dr. Benay Spice and are not all within treatment parameters.   ANC 1.6  Per physician team, patient will not be receiving treatment today.

## 2021-06-25 ENCOUNTER — Encounter: Payer: Self-pay | Admitting: Oncology

## 2021-06-28 DIAGNOSIS — Q525 Fusion of labia: Secondary | ICD-10-CM | POA: Diagnosis not present

## 2021-06-28 DIAGNOSIS — N3001 Acute cystitis with hematuria: Secondary | ICD-10-CM | POA: Diagnosis not present

## 2021-06-28 DIAGNOSIS — N3281 Overactive bladder: Secondary | ICD-10-CM | POA: Diagnosis not present

## 2021-06-28 DIAGNOSIS — N952 Postmenopausal atrophic vaginitis: Secondary | ICD-10-CM | POA: Diagnosis not present

## 2021-06-28 DIAGNOSIS — C210 Malignant neoplasm of anus, unspecified: Secondary | ICD-10-CM | POA: Diagnosis not present

## 2021-06-29 ENCOUNTER — Encounter: Payer: Self-pay | Admitting: Cardiovascular Disease

## 2021-06-29 ENCOUNTER — Other Ambulatory Visit: Payer: Self-pay

## 2021-06-29 ENCOUNTER — Ambulatory Visit (INDEPENDENT_AMBULATORY_CARE_PROVIDER_SITE_OTHER): Payer: Medicare HMO | Admitting: Cardiovascular Disease

## 2021-06-29 DIAGNOSIS — R0789 Other chest pain: Secondary | ICD-10-CM

## 2021-06-29 NOTE — Progress Notes (Signed)
Cardiology Office Note:    Date:  06/29/2021   ID:  Ashley Moore, DOB [birth year], MRN [id number]  PCP:  [name], MD   [facility] Providers Cardiologist:  Kiyoto Slomski    Referring MD: [name], MD   Chief Complaint  Patient presents with   Chest Pain    Oct. 11, 2022   Ashley Moore is a 70 y.o. female with a hx of chest pain .  We were asked to see her today by Dr. Fara Boros for further evaluation of her chest discomfort.  She has metastatic anal cancer - multiple widespread  Had an injection of of G- CSF.   Side effect of bone or muscle pan is listed at the 1st common side effect.   Not able to do much exercise Is just now able to do normal daily activities.   She fell in April.  Hs extensive bruising  Broke her R shoulder  Has done PT but has not needed surgery   Past Medical History:  Diagnosis Date   Arthritis    Bowen's disease    excised 1992   Chronic low back pain    Chronic radiation cystitis    DDD (degenerative disc disease), cervical    Family history of breast cancer    Family history of lung cancer    Family history of non-Hodgkin's lymphoma    Family history of ovarian cancer    Headache    Hx of varicella    Hydronephrosis of right kidney    urologist--- dr Tresa Moore,  malignant,  treated with ureter stent   Hypothyroidism    followed by pcp   Lower urinary tract symptoms (LUTS)    01-12-2021  per pt treated with accupuncture at dr Tresa Moore office   Lymphedema of both lower extremities    PICC (peripherally inserted central catheter) in place 01/11/2021   placed at [facility] in [place], [place] for IV antibiotics   Positive blood culture 01/08/2021   Klebsiella Pneumaniae,  treated with daily IV antibiotic   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[facility]) oncologist--- dr Benay Spice   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,  invasive SCC , completed chemo/ radiation 12-23-2016;  recurrent metastatsis retroperitoneal lymphdenopathy,  completed radiation 04-13-2020 residual  right ureteral uropathy obstruction   Retroperitoneal lymphadenopathy    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   Sepsis due to Klebsiella pneumoniae ([place]) 01/07/2021   pt admitted to [facility] in [place],  dx sepsis secondary to acute pyelonephritis with bacteremia , positive blood culture,  discharged 01-11-2021 home daily IV antibiotic   Wears glasses     Past Surgical History:  Procedure Laterality Date   BUNIONECTOMY Right yrs ago   [place] Right 06/12/2020   Procedure: CYSTOSCOPY WITH RETROGRADE PYELOGRAM/URETERAL STENT EXCHANGE;  Surgeon: Alexis Frock, MD;  Location: [facility];  Service: Urology;  Laterality: Right;   CYSTOSCOPY W/ URETERAL STENT PLACEMENT Right 01/13/2021   Procedure: CYSTOSCOPY WITH RETROGRADE PYELOGRAM/URETERAL STENT REMOVALRIGHT;  Surgeon: Alexis Frock, MD;  Location: [facility];  Service: Urology;  Laterality: Right;  [place], URETEROSCOPY AND STENT PLACEMENT Right 01/28/2020   Procedure: CYSTOSCOPY WITH RETROGRADE PYELOGRAM, URETEROSCOPY AND STENT PLACEMENT;  Surgeon: Alexis Frock, MD;  Location: [facility];  Service: Urology;  Laterality: Right;   IR GENERIC HISTORICAL  11/14/2016   IR US GUIDE VASC ACCESS RIGHT 11/14/2016 [facility]   IR GENERIC  HISTORICAL  11/14/2016   IR FLUORO GUIDE CV LINE RIGHT 11/14/2016 [facility]   IR GENERIC HISTORICAL  12/12/2016   IR US GUIDE VASC ACCESS RIGHT 12/12/2016 [name], MD [facility]   IR GENERIC HISTORICAL  12/12/2016   IR FLUORO GUIDE CV LINE RIGHT 12/12/2016 [name], MD [facility]   SKIN CANCER EXCISION  1990   bowens disease    Current Medications: Current Meds  Medication Sig   acetaminophen (TYLENOL) 500 MG tablet Take 1,000 mg by mouth every 8 (eight) hours as needed for moderate pain.   Cholecalciferol (VITAMIN D3 PO) Take 2,000 Units by mouth  daily.   conjugated estrogens (PREMARIN) vaginal cream Place 1 Applicatorful vaginally daily.   diclofenac Sodium (VOLTAREN) 1 % GEL Apply 2 g topically daily. To shoulder   levothyroxine (SYNTHROID) 50 MCG tablet TAKE 1 TABLET EVERY DAY (NEED MD APPOINTMENT FOR REFILLS)   loratadine-pseudoephedrine (CLARITIN-D 12 HOUR) 5-120 MG tablet Take 1 tablet by mouth 2 (two) times daily.   LORazepam (ATIVAN) 0.5 MG tablet Take 1 tablet (0.5 mg total) by mouth every 12 (twelve) hours as needed for anxiety.   Multiple Vitamins-Minerals (MULTIVITAMIN ADULT) CHEW Chew 1 capsule by mouth daily.   MYRBETRIQ 50 MG TB24 tablet Take 50 mg by mouth daily.   nitrofurantoin, macrocrystal-monohydrate, (MACROBID) 100 MG capsule Take 1 capsule by mouth 2 (two) times daily. For 7 days   OVER THE COUNTER MEDICATION Place 1 drop into both eyes daily as needed (dry eyes).   polyethylene glycol (MIRALAX) 17 g packet Take 17 g by mouth daily as needed for mild constipation.     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   She is a former smoker   Retired Programmer, applications; Conservator, museum/gallery   etoh   Red wine  1 per night.    Tea green tea and earl gray    Moved from [place] to [place] area in 1984/01/03   1 pregnancy   Husband died spring  2016 cv   Sister died 04/05/2015  Bone cancer    3 remaining sisters            Social Determinants of Health   Financial Resource Strain: Low Risk    Difficulty of Paying Living Expenses: Not hard  at all  Food Insecurity: No Food Insecurity   Worried About Charity fundraiser in the Last Year: Never true   Ran Out of Food in the Last Year: Never true  Transportation Needs: No Transportation Needs   Lack of Transportation (Medical): No   Lack of Transportation (Non-Medical): No  Physical Activity: Sufficiently Active   Days of Exercise per Week: 5 days   Minutes of Exercise per Session: 30 min  Stress: No Stress Concern Present   Feeling of Stress : Not at all  Social Connections: Moderately Isolated   Frequency of Communication with Friends and Family: More than three times a week   Frequency of Social Gatherings with Friends and Family: More than three times a week   Attends Religious Services: 1 to 4 times per year   Active Member of [employer] or Organizations: No   Attends Archivist Meetings: Never   Marital Status: Widowed     Family History: The patient's family history includes Breast cancer (age of onset: 16) in her mother; Breast cancer (age of onset: 45) in her sister and sister; Breast cancer (age of onset: 70) in her maternal aunt; Breast cancer (age of onset: 42) in her maternal grandmother; Lung cancer in her mother; Melanoma in her sister; Non-Hodgkin's lymphoma in her paternal grandmother; Ovarian cancer (age of onset: 11) in her mother; Pancreatitis in her father.  ROS:   Please see the history of present illness.     All other systems reviewed and are negative.  EKGs/Labs/Other  Studies Reviewed:    The following studies were reviewed today:   EKG:  April 07, 2021:   NSR at 91.  Normal   Recent Labs: 01/27/2021: Magnesium 2.3 06/23/2021: ALT 10; BUN 16; Creatinine 0.95; Hemoglobin 9.4; Platelet Count 155; Potassium 4.2; Sodium 136; TSH 6.938  Recent Lipid Panel    Component Value Date/Time   CHOL 153 01/08/2020 1008   TRIG 66.0 01/08/2020 1008   HDL 76.30 01/08/2020 1008   CHOLHDL 2 01/08/2020 1008   VLDL 13.2 01/08/2020 1008   LDLCALC 63 01/08/2020 1008     Risk Assessment/Calculations:           Physical Exam:    VS:  BP 130/84   Moore (!) 115   Ht 5\' 2"  (1.575 m)   Wt 169 lb 3.2 oz (76.7 kg)   SpO2 99%   BMI 30.95 kg/m     Wt Readings from Last 3 Encounters:  06/29/21 169 lb 3.2 oz (76.7 kg)  06/23/21 167 lb 3.2 oz (75.8 kg)  06/08/21 165 lb (74.8 kg)     GEN:  Well nourished, well developed in no acute distress HEENT: Normal NECK: No JVD; No carotid bruits LYMPHATICS: No lymphadenopathy CARDIAC: RRR, no murmurs, rubs, gallops RESPIRATORY:  Clear to auscultation without rales, wheezing or rhonchi  ABDOMEN: Soft, non-tender, non-distended MUSCULOSKELETAL:  No edema; No deformity  SKIN: Warm and dry NEUROLOGIC:  Alert and oriented x 3 PSYCHIATRIC:  Normal affect   ASSESSMENT:    1. Atypical chest pain    PLAN:    In order of problems listed above:  Atypical chest pain: Ashley Moore presents for further evaluation of some atypical chest pain.  The pain occurred after she she received G- CSF.   Bone and muscle pain are the top side effects listed for this medication .   At this time it appears that she has atypical chest pain that is not due to a cardiac etiology.  I do not think she needs any additional testing at this time.  I will reevaluate her in 6 months make sure that she is doing well from a cardiac standpoint.       Medication Adjustments/Labs and Tests Ordered: Current medicines are reviewed at length with the patient  today.  Concerns regarding medicines are outlined above.  No orders of the defined types were placed in this encounter.  No orders of the defined types were placed in this encounter.   Patient Instructions  Medication Instructions:  Your physician recommends that you continue on your current medications as directed. Please refer to the Current Medication list given to you today.  *If you need a refill on your cardiac medications before your next appointment, please call your pharmacy*   Lab Work: None If you have labs (blood work) drawn today and your tests are completely normal, you will receive your results only by: [place] (if you have MyChart) OR A paper copy in the mail If you have any lab test that is abnormal or we need to change your treatment, we will call you to review the results.   Testing/Procedures: None   Follow-Up: At [facility], you and your health needs are our priority.  As part of our continuing mission to provide you with exceptional heart care, we have created designated Provider Care Teams.  These Care Teams include your primary Cardiologist (physician) and Advanced Practice Providers (APPs -  Physician Assistants and Nurse Practitioners) who all work together to provide you with the care you need, when you need it.  We recommend signing up for the patient portal called "MyChart".  Sign up information is provided on this After Visit Summary.  MyChart is used to connect with patients for Virtual Visits (Telemedicine).  Patients are able to view lab/test results, encounter notes, upcoming appointments, etc.  Non-urgent messages can be sent to your provider as well.   To learn more about what you can do with MyChart, go to [email or web address].    Your next appointment:   6 month(s)  The format for your next appointment:   In Person  Provider:   You may see Mertie Moores, MD or one of the following Advanced Practice Providers on your designated  Care Team:   Richardson Dopp, PA-C Robbie Lis, [place]   Other Instructions     Signed, Mertie Moores, MD  06/29/2021 4:49 PM    [place]

## 2021-06-29 NOTE — Patient Instructions (Signed)
Medication Instructions:  Your physician recommends that you continue on your current medications as directed. Please refer to the Current Medication list given to you today.  *If you need a refill on your cardiac medications before your next appointment, please call your pharmacy*   Lab Work: None If you have labs (blood work) drawn today and your tests are completely normal, you will receive your results only by: [place] (if you have MyChart) OR A paper copy in the mail If you have any lab test that is abnormal or we need to change your treatment, we will call you to review the results.   Testing/Procedures: None   Follow-Up: At [facility], you and your health needs are our priority.  As part of our continuing mission to provide you with exceptional heart care, we have created designated Provider Care Teams.  These Care Teams include your primary Cardiologist (physician) and Advanced Practice Providers (APPs -  Physician Assistants and Nurse Practitioners) who all work together to provide you with the care you need, when you need it.  We recommend signing up for the patient portal called "MyChart".  Sign up information is provided on this After Visit Summary.  MyChart is used to connect with patients for Virtual Visits (Telemedicine).  Patients are able to view lab/test results, encounter notes, upcoming appointments, etc.  Non-urgent messages can be sent to your provider as well.   To learn more about what you can do with MyChart, go to [email or web address].    Your next appointment:   6 month(s)  The format for your next appointment:   In Person  Provider:   You may see Mertie Moores, MD or one of the following Advanced Practice Providers on your designated Care Team:   Richardson Dopp, PA-C Robbie Lis, [place]   Other Instructions

## 2021-07-01 ENCOUNTER — Inpatient Hospital Stay: Payer: Medicare HMO

## 2021-07-01 ENCOUNTER — Encounter: Payer: Self-pay | Admitting: Nurse Practitioner

## 2021-07-01 ENCOUNTER — Other Ambulatory Visit: Payer: Self-pay

## 2021-07-01 ENCOUNTER — Inpatient Hospital Stay: Payer: Medicare HMO | Admitting: Nurse Practitioner

## 2021-07-01 VITALS — HR 96

## 2021-07-01 VITALS — BP 133/83 | HR 109 | Temp 98.7°F | Resp 18 | Ht 62.0 in | Wt 168.8 lb

## 2021-07-01 DIAGNOSIS — C210 Malignant neoplasm of anus, unspecified: Secondary | ICD-10-CM

## 2021-07-01 DIAGNOSIS — Z79899 Other long term (current) drug therapy: Secondary | ICD-10-CM | POA: Diagnosis not present

## 2021-07-01 DIAGNOSIS — Z5111 Encounter for antineoplastic chemotherapy: Secondary | ICD-10-CM | POA: Diagnosis not present

## 2021-07-01 DIAGNOSIS — D701 Agranulocytosis secondary to cancer chemotherapy: Secondary | ICD-10-CM | POA: Diagnosis not present

## 2021-07-01 LAB — CBC WITH DIFFERENTIAL (CANCER CENTER ONLY)
Abs Immature Granulocytes: 0.01 10*3/uL (ref 0.00–0.07)
Basophils Absolute: 0 10*3/uL (ref 0.0–0.1)
Basophils Relative: 0 %
Eosinophils Absolute: 0.1 10*3/uL (ref 0.0–0.5)
Eosinophils Relative: 1 %
HCT: 29.4 % — ABNORMAL LOW (ref 36.0–46.0)
Hemoglobin: 9.2 g/dL — ABNORMAL LOW (ref 12.0–15.0)
Immature Granulocytes: 0 %
Lymphocytes Relative: 8 %
Lymphs Abs: 0.3 10*3/uL — ABNORMAL LOW (ref 0.7–4.0)
MCH: 28.8 pg (ref 26.0–34.0)
MCHC: 31.3 g/dL (ref 30.0–36.0)
MCV: 92.2 fL (ref 80.0–100.0)
Monocytes Absolute: 0.4 10*3/uL (ref 0.1–1.0)
Monocytes Relative: 9 %
Neutro Abs: 3.3 10*3/uL (ref 1.7–7.7)
Neutrophils Relative %: 82 %
Platelet Count: 167 10*3/uL (ref 150–400)
RBC: 3.19 MIL/uL — ABNORMAL LOW (ref 3.87–5.11)
RDW: 16 % — ABNORMAL HIGH (ref 11.5–15.5)
WBC Count: 4.1 10*3/uL (ref 4.0–10.5)
nRBC: 0 % (ref 0.0–0.2)

## 2021-07-01 LAB — CMP (CANCER CENTER ONLY)
ALT: 10 U/L (ref 0–44)
AST: 22 U/L (ref 15–41)
Albumin: 3.8 g/dL (ref 3.5–5.0)
Alkaline Phosphatase: 74 U/L (ref 38–126)
Anion gap: 10 (ref 5–15)
BUN: 25 mg/dL — ABNORMAL HIGH (ref 8–23)
CO2: 22 mmol/L (ref 22–32)
Calcium: 9.2 mg/dL (ref 8.9–10.3)
Chloride: 103 mmol/L (ref 98–111)
Creatinine: 1.08 mg/dL — ABNORMAL HIGH (ref 0.44–1.00)
GFR, Estimated: 55 mL/min — ABNORMAL LOW (ref >60)
Glucose, Bld: 86 mg/dL (ref 70–99)
Potassium: 4.4 mmol/L (ref 3.5–5.1)
Sodium: 135 mmol/L (ref 135–145)
Total Bilirubin: 0.2 mg/dL — ABNORMAL LOW (ref 0.3–1.2)
Total Protein: 7.1 g/dL (ref 6.5–8.1)

## 2021-07-01 MED ORDER — SODIUM CHLORIDE 0.9 % IV SOLN
150.0000 mg | Freq: Once | INTRAVENOUS | Status: AC
  Administered 2021-07-01: 150 mg via INTRAVENOUS
  Filled 2021-07-01: qty 5

## 2021-07-01 MED ORDER — SODIUM CHLORIDE 0.9 % IV SOLN
Freq: Once | INTRAVENOUS | Status: AC
Start: 2021-07-01 — End: 2021-07-01

## 2021-07-01 MED ORDER — SODIUM CHLORIDE 0.9 % IV SOLN
250.8000 mg | Freq: Once | INTRAVENOUS | Status: AC
  Administered 2021-07-01: 250 mg via INTRAVENOUS
  Filled 2021-07-01: qty 25

## 2021-07-01 MED ORDER — PALONOSETRON HCL INJECTION 0.25 MG/5ML
0.2500 mg | Freq: Once | INTRAVENOUS | Status: AC
  Administered 2021-07-01: 0.25 mg via INTRAVENOUS
  Filled 2021-07-01: qty 5

## 2021-07-01 MED ORDER — FAMOTIDINE 20 MG IN NS 100 ML IVPB
20.0000 mg | Freq: Once | INTRAVENOUS | Status: AC
Start: 2021-07-01 — End: 2021-07-01
  Administered 2021-07-01: 20 mg via INTRAVENOUS
  Filled 2021-07-01: qty 100

## 2021-07-01 MED ORDER — SODIUM CHLORIDE 0.9 % IV SOLN
10.0000 mg | Freq: Once | INTRAVENOUS | Status: AC
  Administered 2021-07-01: 10 mg via INTRAVENOUS
  Filled 2021-07-01: qty 10

## 2021-07-01 NOTE — Progress Notes (Signed)
Cmp entered

## 2021-07-01 NOTE — Progress Notes (Signed)
Patient seen by Lisa Thomas NP today  Vitals are within treatment parameters.  Labs reviewed by Lisa Thomas NP and are within treatment parameters.  Per physician team, patient is ready for treatment and there are NO modifications to the treatment plan.     

## 2021-07-01 NOTE — Progress Notes (Signed)
Patient presents for treatment. RN assessment completed along with the following:  Labs/vitals reviewed - Yes, and within treatment parameters.   Weight within 10% of previous measurement - Yes Oncology Treatment Attestation completed for current therapy- Yes, on date 03/01/2021 Informed consent completed and reflects current therapy/intent - Yes, on date 03/11/2021             Provider progress note reviewed - Yes, today's provider note was reviewed. Treatment/Antibody/Supportive plan reviewed - Yes, and hold Taxol today due to persistent foot numbness . S&H and other orders reviewed - Yes, and there are no additional orders identified. Previous treatment date reviewed - Yes, and the appropriate amount of time has elapsed between treatments. Clinic Hand Off Received from - Yes, Lattie Haw, NP  Patient to proceed with treatment.

## 2021-07-01 NOTE — Progress Notes (Signed)
[facility] OFFICE PROGRESS NOTE   Diagnosis: Anal cancer  INTERVAL HISTORY:   Ashley Moore returns as scheduled.  She completed cycle 5 Taxol/carboplatin 06/02/2021.  Treatment was held 06/23/2021 due to mild neutropenia and foot numbness.  The bilateral foot numbness is unchanged.  The numbness does not interfere with her ability to ambulate.  She denies nausea/vomiting.  Bowels are moving.  No fever.  Objective:  Vital signs in last 24 hours:  Blood pressure 133/83, pulse (!) 109, temperature 98.7 F (37.1 C), temperature source Oral, resp. rate 18, height 5\' 2"  (1.575 m), weight 168 lb 12.8 oz (76.6 kg), SpO2 100 %.    HEENT: No thrush or ulcers. Lymphatics: Multiple small left scalene and low left anterior cervical nodes.  Estimate largest to be about 1 cm. Resp: Lungs clear bilaterally. Cardio: Regular rate and rhythm. GI: Abdomen soft and nontender.  No hepatosplenomegaly. Vascular: Trace pitting edema lower leg bilaterally.     Lab Results:  Lab Results  Component Value Date   WBC 4.1 07/01/2021   HGB 9.2 (L) 07/01/2021   HCT 29.4 (L) 07/01/2021   MCV 92.2 07/01/2021   PLT 167 07/01/2021   NEUTROABS 3.3 07/01/2021    Imaging:  No results found.  Medications: I have reviewed the patient's current medications.  Assessment/Plan:  Anal cancer CT abdomen/pelvis 10/21/2016-thickening of the anus extending to the junction between the anus and rectum with a large stool ball in the rectum and mild fat stranding posterior to the rectum. Enlarged lymph nodes in the right and left inguinal regions. A few mildly prominent nodes seen posterior to the rectum. 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 PET scan 25/85/2778-[id number] hypermetabolic anal mass with hypermetabolic metastases to the groin region bilaterally, left pelvic sidewall and presacral space. Initiation of radiation and cycle 1 5-FU/mitomycin C 11/14/2016 Cycle 2  5-FU/mitomycin C 12/12/2016 (5-FU dose reduced due to mucositis, diarrhea, skin breakdown) Radiation completed 12/23/2016 CT abdomen/pelvis 04/14/2017-resolution of anal mass and bilateral inguinal lymphadenopathy. No residual tumor seen. CT abdomen/pelvis 01/28/2020-right hydroureteronephrosis, transition at the level of the mid right ureter, retroperitoneal lymphadenopathy CT renal stone study 01/28/2020-severe right hydronephrosis and proximal right hydroureter, 8 mm area of increased attenuation in the proximal right ureter-stone versus soft tissue mass PET 02/13/2020-hypermetabolic right retroperitoneal nodal metastases in the aortocaval and posterior pericaval chains, nonspecific mild anal wall hypermetabolism, right nephroureteral stent 02/21/2020 anorectal exam per Dr. Ashley Jacobs radiation changes of the skin around the anal margin.  Posterior midline smooth scarring.  Mildly stenotic.  Stenosis seems better.  No palpable concerning lesions.  Entire anal canal and distal rectum feels smooth and healthy.  Partial anoscopy performed with no lesions in the anal canal. Xeloda/radiation beginning 03/02/2020, completed 04/13/2020 CT abdomen/pelvis 06/17/2020-resolution of right retroperitoneal lymphadenopathy, no remaining pathologically enlarged lymph nodes, residual mild right hydronephrosis, no evidence of progressive disease, stable anal wall thickening Digital rectal exam by Dr. Quentin Cornwall 08/26/2020-posterior midline smooth scarring.  Mildly stenotic.  Stenosis seems better.  No palpable concerning lesions.  Anal canal feels smooth without palpable concern.  Unable to tolerate anoscopy.  Next visit in 6 months for surveillance. CT abdomen/pelvis 10/28/2020-no evidence of local recurrence or metastatic disease.  Stable mild rectal wall thickening.  New diffuse bladder wall thickening possibly related to prior radiation CT abdomen/pelvis 01/16/2021- recurrent right-sided hydronephrosis and proximal right  hydroureter status post removal of right ureter stent, new soft tissue nodule along the course of the right ureter between the right psoas  and IVC suspicious for recurrent lymphadenopathy PET 02/12/2021-small focus of hypermetabolism at the anorectal junction without a definable mass, right retroperitoneal nodal metastasis with obstruction of the proximal right ureter, right middle lobe hypermetabolic nodule, hypermetabolism in the right hilum without a defined nodal mass, hypermetabolic left supraclavicular and left axillary nodes 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 Cycle 1 Taxol/carboplatin 03/11/2021 Cycle 2 Taxol/carboplatin 03/31/2021, Fulphila Cycle 3 Taxol/carboplatin 04/21/2021, Fulphila CTs 05/11/2021-decrease size of FDG avid nodule in right middle lobe, right middle lobe subpleural nodule slightly enlarged, decreased in size of left axillary and subpectoral nodes, unchanged FDG avid right iliac nodes Cycle 4 Taxol/carboplatin 05/12/2021, G-CSF discontinued secondary to poor tolerance, Taxol and carboplatin dose reduced Cycle 5 Taxol/carboplatin 06/02/2021 Cycle 6 carboplatin 07/01/2021, Taxol held due to neuropathy Left labial lesions. Question direct extension from the anal cancer versus metastatic disease from anal cancer versus a separate malignant process. 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 Multiple family members with breast cancer. History of hypokalemia-likely secondary to decreased nutritional intake and diarrhea; potassium in normal range 01/16/2017. No longer taking a potassium supplement. Right hydroureteronephrosis on CT 01/28/2020, status post a cystoscopy/pyelogram 01/28/2020 confirming extrinsic compression of the right ureter, status post stent placement Right ureter stent exchange 06/12/2020 Right ureter stent  removed 01/13/2021   8.  Admission 01/07/2021 with Klebsiella pneumoniae urosepsis/bacteremia  9.  Hospital admission 05/15/2021 with UTI/possible right pyelonephritis      Disposition: Ashley Moore appears unchanged.  She has completed 5 cycles of Taxol/carboplatin.  Treatment was held last week due to bilateral foot numbness and mild neutropenia.  The foot numbness persists.  ANC is in adequate range to treat.  Plan to hold Taxol today, proceed with carboplatin.  Restaging chest CT after cycle 7.  CBC from today reviewed.  Counts adequate for treatment.  ANC improved at 3.3.  She will return for lab, follow-up, chemotherapy in 3 weeks.    Ned Card ANP/GNP-BC   07/01/2021  8:50 AM

## 2021-07-01 NOTE — Patient Instructions (Addendum)
[place]   Discharge Instructions: Thank you for choosing [facility] to provide your oncology and hematology care.   If you have a lab appointment with the [facility], please go directly to the [facility] and check in at the registration area.   Wear comfortable clothing and clothing appropriate for easy access to any Portacath or PICC line.   We strive to give you quality time with your provider. You may need to reschedule your appointment if you arrive late (15 or more minutes).  Arriving late affects you and other patients whose appointments are after yours.  Also, if you miss three or more appointments without notifying the office, you may be dismissed from the clinic at the provider's discretion.      For prescription refill requests, have your pharmacy contact our office and allow 72 hours for refills to be completed.    Today you received the following chemotherapy and/or immunotherapy agents Carboplatin (ELOXATIN).     To help prevent nausea and vomiting after your treatment, we encourage you to take your nausea medication as directed.  BELOW ARE SYMPTOMS THAT SHOULD BE REPORTED IMMEDIATELY: *FEVER GREATER THAN 100.4 F (38 C) OR HIGHER *CHILLS OR SWEATING *NAUSEA AND VOMITING THAT IS NOT CONTROLLED WITH YOUR NAUSEA MEDICATION *UNUSUAL SHORTNESS OF BREATH *UNUSUAL BRUISING OR BLEEDING *URINARY PROBLEMS (pain or burning when urinating, or frequent urination) *BOWEL PROBLEMS (unusual diarrhea, constipation, pain near the anus) TENDERNESS IN MOUTH AND THROAT WITH OR WITHOUT PRESENCE OF ULCERS (sore throat, sores in mouth, or a toothache) UNUSUAL RASH, SWELLING OR PAIN  UNUSUAL VAGINAL DISCHARGE OR ITCHING   Items with * indicate a potential emergency and should be followed up as soon as possible or go to the Emergency Department if any problems should occur.  Please show the CHEMOTHERAPY ALERT CARD or IMMUNOTHERAPY ALERT CARD at  check-in to the Emergency Department and triage nurse.  Should you have questions after your visit or need to cancel or reschedule your appointment, please contact [facility]  Dept: [phone]  and follow the prompts.  Office hours are 8:00 a.m. to 4:30 p.m. Monday - Friday. Please note that voicemails left after 4:00 p.m. may not be returned until the following business day.  We are closed weekends and major holidays. You have access to a nurse at all times for urgent questions. Please call the main number to the clinic Dept: [phone] and follow the prompts.   For any non-urgent questions, you may also contact your provider using MyChart. We now offer e-Visits for anyone 2 and older to request care online for non-urgent symptoms. For details visit [email or web address].   Also download the MyChart app! Go to the app store, search "MyChart", open the app, select [facility], and log in with your MyChart username and password.  Due to Covid, a mask is required upon entering the hospital/clinic. If you do not have a mask, one will be given to you upon arrival. For doctor visits, patients may have 1 support person aged 72 or older with them. For treatment visits, patients cannot have anyone with them due to current Covid guidelines and our immunocompromised population.    Carboplatin injection What is this medication? CARBOPLATIN (KAR boe pla tin) is a chemotherapy drug. It targets fast dividing cells, like cancer cells, and causes these cells to die. This medicine is used to treat ovarian cancer and many other cancers. This medicine may be used for  other purposes; ask your health care provider or pharmacist if you have questions. COMMON BRAND NAME(S): Paraplatin What should I tell my care team before I take this medication? They need to know if you have any of these conditions: blood disorders hearing problems kidney disease recent or ongoing radiation  therapy an unusual or allergic reaction to carboplatin, cisplatin, other chemotherapy, other medicines, foods, dyes, or preservatives pregnant or trying to get pregnant breast-feeding How should I use this medication? This drug is usually given as an infusion into a vein. It is administered in a hospital or clinic by a specially trained health care professional. Talk to your pediatrician regarding the use of this medicine in children. Special care may be needed. Overdosage: If you think you have taken too much of this medicine contact a poison control center or emergency room at once. NOTE: This medicine is only for you. Do not share this medicine with others. What if I miss a dose? It is important not to miss a dose. Call your doctor or health care professional if you are unable to keep an appointment. What may interact with this medication? medicines for seizures medicines to increase blood counts like filgrastim, pegfilgrastim, sargramostim some antibiotics like amikacin, gentamicin, neomycin, streptomycin, tobramycin vaccines Talk to your doctor or health care professional before taking any of these medicines: acetaminophen aspirin ibuprofen ketoprofen naproxen This list may not describe all possible interactions. Give your health care provider a list of all the medicines, herbs, non-prescription drugs, or dietary supplements you use. Also tell them if you smoke, drink alcohol, or use illegal drugs. Some items may interact with your medicine. What should I watch for while using this medication? Your condition will be monitored carefully while you are receiving this medicine. You will need important blood work done while you are taking this medicine. This drug may make you feel generally unwell. This is not uncommon, as chemotherapy can affect healthy cells as well as cancer cells. Report any side effects. Continue your course of treatment even though you feel ill unless your doctor tells  you to stop. In some cases, you may be given additional medicines to help with side effects. Follow all directions for their use. Call your doctor or health care professional for advice if you get a fever, chills or sore throat, or other symptoms of a cold or flu. Do not treat yourself. This drug decreases your body's ability to fight infections. Try to avoid being around people who are sick. This medicine may increase your risk to bruise or bleed. Call your doctor or health care professional if you notice any unusual bleeding. Be careful brushing and flossing your teeth or using a toothpick because you may get an infection or bleed more easily. If you have any dental work done, tell your dentist you are receiving this medicine. Avoid taking products that contain aspirin, acetaminophen, ibuprofen, naproxen, or ketoprofen unless instructed by your doctor. These medicines may hide a fever. Do not become pregnant while taking this medicine. Women should inform their doctor if they wish to become pregnant or think they might be pregnant. There is a potential for serious side effects to an unborn child. Talk to your health care professional or pharmacist for more information. Do not breast-feed an infant while taking this medicine. What side effects may I notice from receiving this medication? Side effects that you should report to your doctor or health care professional as soon as possible: allergic reactions like skin rash, itching or  hives, swelling of the face, lips, or tongue signs of infection - fever or chills, cough, sore throat, pain or difficulty passing urine signs of decreased platelets or bleeding - bruising, pinpoint red spots on the skin, black, tarry stools, nosebleeds signs of decreased red blood cells - unusually weak or tired, fainting spells, lightheadedness breathing problems changes in hearing changes in vision chest pain high blood pressure low blood counts - This drug may  decrease the number of white blood cells, red blood cells and platelets. You may be at increased risk for infections and bleeding. nausea and vomiting pain, swelling, redness or irritation at the injection site pain, tingling, numbness in the hands or feet problems with balance, talking, walking trouble passing urine or change in the amount of urine Side effects that usually do not require medical attention (report to your doctor or health care professional if they continue or are bothersome): hair loss loss of appetite metallic taste in the mouth or changes in taste This list may not describe all possible side effects. Call your doctor for medical advice about side effects. You may report side effects to FDA at 1-800-FDA-1088. Where should I keep my medication? This drug is given in a hospital or clinic and will not be stored at home. NOTE: This sheet is a summary. It may not cover all possible information. If you have questions about this medicine, talk to your doctor, pharmacist, or health care provider.  2022 Elsevier/Gold Standard (2007-12-11 14:38:05)

## 2021-07-02 ENCOUNTER — Ambulatory Visit: Payer: Medicare HMO | Admitting: Rehabilitative and Restorative Service Providers"

## 2021-07-02 ENCOUNTER — Encounter: Payer: Self-pay | Admitting: Rehabilitative and Restorative Service Providers"

## 2021-07-02 DIAGNOSIS — R600 Localized edema: Secondary | ICD-10-CM

## 2021-07-02 DIAGNOSIS — M25611 Stiffness of right shoulder, not elsewhere classified: Secondary | ICD-10-CM

## 2021-07-02 DIAGNOSIS — G8929 Other chronic pain: Secondary | ICD-10-CM

## 2021-07-02 DIAGNOSIS — M25511 Pain in right shoulder: Secondary | ICD-10-CM

## 2021-07-02 DIAGNOSIS — M6281 Muscle weakness (generalized): Secondary | ICD-10-CM

## 2021-07-02 NOTE — Therapy (Signed)
[facility] Physical Therapy [address] [place], [place], [address] Phone: [phone]   Fax:  [phone]  Physical Therapy Evaluation  Patient Details  Name: Ashley Moore MRN: [id number] Date of Birth: 11-20-1950 Referring Provider (PT): Newt Minion MD  Referring diagnosis? M79.601 Treatment diagnosis? (if different than referring diagnosis) M25.611  M62.81  M25.511  G89.29  R60.0 What was this (referring dx) caused by? []  Surgery [x]  Fall []  Ongoing issue []  Arthritis []  Other: ____________  Laterality: [x]  Rt []  Lt []  Both  Check all possible CPT codes:      [x]  97110 (Therapeutic Exercise)  []  92507 (SLP Treatment)  [x]  76195 (Neuro Re-ed)   []  92526 (Swallowing Treatment)   []  97116 (Gait Training)   []  [id number] (Cognitive Training, 1st 15 minutes) [x]  97140 (Manual Therapy)   []  97130 (Cognitive Training, each add'l 15 minutes)  [x]  97530 (Therapeutic Activities)  []  Other, List CPT Code ____________    []  09326 (Self Care)       [x]  All codes above (97110 - 97535)  []  97012 (Mechanical Traction)  [x]  97014 (E-stim Unattended)  []  97032 (E-stim manual)  []  97033 (Ionto)  []  97035 (Ultrasound)  []  97760 (Orthotic Fit) [x]  97750 (Physical Performance Training) []  [id number] (Aquatic Therapy) []  97034 (Contrast Bath) []  [id number] (Paraffin) []  97597 (Wound Care 1st 20 sq cm) []  97598 (Wound Care each add'l 20 sq cm) [x]  97016 (Vasopneumatic Device) []  [id number] (Orthotic Training) []  [phone] (Prosthetic Training)   Encounter Date: 07/02/2021   PT End of Session - 07/02/21 1635     Visit Number 1    Number of Visits 16    Date for PT Re-Evaluation 08/27/21    Authorization Type [employer]    Authorization Time Period Through 08/27/2021    Authorization - Visit Number 1    PT Start Time 1430    PT Stop Time 1515    PT Time Calculation (min) 45 min    Activity Tolerance Patient tolerated treatment well    Behavior During Therapy WFL for tasks  assessed/performed             Past Medical History:  Diagnosis Date   Arthritis    Bowen's disease    excised 1992   Chronic low back pain    Chronic radiation cystitis    DDD (degenerative disc disease), cervical    Family history of breast cancer    Family history of lung cancer    Family history of non-Hodgkin's lymphoma    Family history of ovarian cancer    Headache    Hx of varicella    Hydronephrosis of right kidney    urologist--- dr Tresa Moore,  malignant,  treated with ureter stent   Hypothyroidism    followed by pcp   Lower urinary tract symptoms (LUTS)    01-12-2021  per pt treated with accupuncture at dr Tresa Moore office   Lymphedema of both lower extremities    PICC (peripherally inserted central catheter) in place 01/11/2021   placed at [facility] 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  Sepsis due to Klebsiella pneumoniae ([place]) 01/07/2021   pt admitted to [facility] in [place],  dx sepsis secondary to acute pyelonephritis with bacteremia , positive blood culture,  discharged 01-11-2021 home daily IV antibiotic   Wears glasses     Past Surgical History:  Procedure Laterality Date   BUNIONECTOMY Right yrs ago   Bend Right 06/12/2020   Procedure: CYSTOSCOPY WITH RETROGRADE PYELOGRAM/URETERAL STENT EXCHANGE;  Surgeon: Alexis Frock, MD;  Location: [facility];  Service: Urology;  Laterality: Right;   CYSTOSCOPY W/ URETERAL STENT PLACEMENT Right  01/13/2021   Procedure: CYSTOSCOPY WITH RETROGRADE PYELOGRAM/URETERAL STENT REMOVALRIGHT;  Surgeon: Alexis Frock, MD;  Location[facility];  Service: Urology;  Laterality: Right;  [place], URETEROSCOPY AND STENT PLACEMENT Right 01/28/2020   Procedure: CYSTOSCOPY WITH RETROGRADE PYELOGRAM, URETEROSCOPY AND STENT PLACEMENT;  Surgeon: Alexis Frock, MD;  Location: [facility];  Service: Urology;  Laterality: Right;   IR GENERIC HISTORICAL  11/14/2016   IR US GUIDE VASC ACCESS RIGHT 11/14/2016 [facility]   IR GENERIC HISTORICAL  11/14/2016   IR FLUORO GUIDE CV LINE RIGHT 11/14/2016 [facility]   IR GENERIC HISTORICAL  12/12/2016   IR US GUIDE VASC ACCESS RIGHT 12/12/2016 [name], MD [facility]   IR GENERIC HISTORICAL  12/12/2016   IR FLUORO GUIDE CV LINE RIGHT 12/12/2016 [name], MD [facility]   SKIN CANCER EXCISION  1990   bowens disease    There were no vitals filed for this visit.    Subjective Assessment - 07/02/21 1631     Subjective Ashley Moore had a fall at work this spring which resulted in a humerus fracture and probable RTC tear.  She started PT with [place] this summer but was interrupted with treatment for cancer.  She returns today wanting to get back into normal work and exercise activities with her R shoulder.    Pertinent History Active metastatic cancer, cervical and lumbar DDD    Limitations Lifting;House hold activities    Patient Stated Goals Return to work and exercise with use of her R shoulder    Currently in Pain? Yes    Pain Score 5     Pain Location Shoulder    Pain Orientation Right    Pain Descriptors / Indicators Aching;Sharp    Pain Type Chronic pain    Pain Radiating Towards NA    Pain Onset More than a month ago    Pain Frequency Constant    Aggravating Factors  Most movement hurts and she can get sharp pains with R shoulder use    Pain Relieving Factors Change of position    Effect of Pain on Daily  Activities Unable to use R UE with most ADLs    Multiple Pain Sites No                [facility] PT Assessment - 07/02/21 0001       Assessment   Medical Diagnosis R shoulder pain    Referring Provider (PT) Newt Minion MD    Onset Date/Surgical Date --   Had a fall in the spring of this year with a humeral fracture and possible RTC tear   Hand Dominance Left    Prior Therapy Had to stop for cancer treatment      Precautions   Precautions None      Balance Screen   Has the patient fallen in the  past 6 months No    Has the patient had a decrease in activity level because of a fear of falling?  No    Is the patient reluctant to leave their home because of a fear of falling?  No      Home Ecologist residence    Living Arrangements Other (Comment)    Available Help at Discharge Personal care attendant      Prior Function   Level of [place] Retired    Leisure Return to exercise      Cognition   Overall Cognitive Status Within Functional Limits for tasks assessed      Observation/Other Assessments   Focus on Therapeutic Outcomes (FOTO)  45 (59 Goal)      ROM / Strength   AROM / PROM / Strength AROM;Strength      AROM   Overall AROM  Deficits    Overall AROM Comments Supine (PROM R)    AROM Assessment Site Shoulder    Right/Left Shoulder Left;Right    Right Shoulder Flexion 140 Degrees    Right Shoulder Internal Rotation 40 Degrees    Right Shoulder External Rotation 40 Degrees    Right Shoulder Horizontal  ADduction 20 Degrees    Left Shoulder Flexion 170 Degrees    Left Shoulder Internal Rotation 70 Degrees    Left Shoulder External Rotation 90 Degrees    Left Shoulder Horizontal ADduction 50 Degrees      Strength   Overall Strength Deficits    Strength Assessment Site Shoulder    Right/Left Shoulder Left;Right    Right Shoulder Internal Rotation --   7.2 pounds   Right Shoulder External Rotation --    < 3 pounds (0.0)   Left Shoulder Internal Rotation --   23.8 pounds   Left Shoulder External Rotation --   16.7 pounds                       Objective measurements completed on examination: See above findings.       [facility] Adult PT Treatment/Exercise - 07/02/21 0001       Exercises   Exercises Shoulder      Shoulder Exercises: Supine   Protraction Strengthening;Right;20 reps;Limitations    Protraction Limitations 3 seconds, palm facing in, comfortable range, L helps PRN      Shoulder Exercises: Seated   Retraction Strengthening;Both;10 reps;Limitations    Retraction Limitations 5 seconds      Shoulder Exercises: Standing   External Rotation Strengthening;Right;10 reps;Limitations    External Rotation Limitations Isometrics in doorframe 5 seconds    Internal Rotation Strengthening;Right;10 reps;Limitations    Internal Rotation Limitations Isometrics 5 seconds in doorframe 5 seconds                     PT Education - 07/02/21 1635     Education Details Reviewed exam findings, imaging and shoulder anatomy    Person(s) Educated Patient    Methods Explanation;Demonstration;Tactile cues;Verbal cues;Handout    Comprehension Verbalized understanding;Tactile cues required;Need further instruction;Returned demonstration;Verbal cues required              PT Short Term Goals - 07/02/21 1643       PT SHORT TERM GOAL #1   Title Ashley Moore will improve her R shoulder PROM (supine) for flexion to 150 degrees; IR to 50 degrees; ER to 60 degrees and horizontal adduction to 30 degrees.  Baseline 140; 40; 40; 20    Time 6    Period Weeks    Status New    Target Date 08/13/21               PT Long Term Goals - 07/02/21 1645       PT LONG TERM GOAL #1   Title Improve FOTO to 59.    Baseline 45    Time 8    Period Weeks    Status New    Target Date 08/27/21      PT LONG TERM GOAL #2   Title Ashley Moore will report less shoulder pain at rest and less  frequent "stabbing" pain with R UE use.    Baseline Aches all the time and frequent stabbing with movement.    Time 8    Period Weeks    Status New    Target Date 08/27/21      PT LONG TERM GOAL #3   Title Improve R shoulder strength as assessed by objective strength measures and FOTO scores.    Baseline ER 0 pounds and IR 7.2 pounds strength testing.    Time 8    Period Weeks    Status New    Target Date 08/27/21      PT LONG TERM GOAL #4   Title Ashley Moore will be independent with her long-term maintenance HEP at DC.    Baseline Prescribed today.    Time 8    Period Weeks    Status New    Target Date 08/27/21                    Plan - 07/02/21 1636     Clinical Impression Statement Ashley Moore has some capsular tightness and general R UE weakness several months after a humerus fracture with probable RTC tear.  She would like to be more functional with her R shoulder at work and return to some exercise.  She will benefit from skilled PT focused on scapular and RTC strength, capsular stretching and functional performance.    Personal Factors and Comorbidities Comorbidity 3+    Comorbidities Metastatic cancer, cervical and lumbar DDD    Examination-Activity Limitations Bathing;Reach Overhead;Dressing;Hygiene/Grooming;Sleep;Lift;Carry    Examination-Participation Restrictions Occupation;Community Activity    Stability/Clinical Decision Making Stable/Uncomplicated    Clinical Decision Making Low    Rehab Potential Good    PT Frequency 2x / week    PT Duration 8 weeks    PT Treatment/Interventions ADLs/Self Care Home Management;Cryotherapy;Electrical Stimulation;Therapeutic activities;Therapeutic exercise;Neuromuscular re-education;Patient/family education;Manual techniques;Passive range of motion    PT Next Visit Plan Review HEP, possibly try UBE, further strength progressions, AROM/capsular stretching as able    PT Home Exercise Plan [email or web address]    Consulted and Agree with Plan of  Care Patient             Patient will benefit from skilled therapeutic intervention in order to improve the following deficits and impairments:  Decreased activity tolerance, Decreased endurance, Decreased mobility, Decreased range of motion, Decreased strength, Hypomobility, Increased edema, Impaired UE functional use, Pain  Visit Diagnosis: Muscle weakness (generalized)  Stiffness of right shoulder, not elsewhere classified  Chronic right shoulder pain  Localized edema     Problem List Patient Active Problem List   Diagnosis Date Noted   Atypical chest pain 06/29/2021   Intractable abdominal pain 05/16/2021   Acute pyelonephritis 05/16/2021   Acute cystitis without hematuria 05/16/2021   Hydronephrosis of right kidney 05/16/2021   Obstructive  uropathy 05/16/2021   Hypothyroidism 05/16/2021   Hyponatremia 05/16/2021   Obesity (BMI 30-39.9) 05/16/2021   Goals of care, counseling/discussion 03/01/2021   SIRS (systemic inflammatory response syndrome) ([place]) 01/13/2021   Pyelonephritis 01/13/2021   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   Genetic testing 01/27/2020   Family history of breast cancer    Family history of lung cancer    Family history of ovarian cancer    Family history of non-Hodgkin's lymphoma    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   Port catheter in place 11/21/2016   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   Anal cancer ([place]) 11/03/2016   Cystitis 05/06/2015   Degenerative cervical disc 04/08/2015   Trigger point of neck 03/26/2015   Pain in joint, ankle and foot 04/23/2013   Visit for preventive health examination 02/13/2013   Routine gynecological examination 02/13/2013   Family history of breast cancer in first degree relative 02/13/2013   Rash and nonspecific skin eruption 02/13/2013   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   VITAMIN D DEFICIENCY 06/25/2008   NECK PAIN 06/02/2008   FOOT PAIN 06/02/2008   Osteopenia  06/02/2008    Farley Ly, PT, MPT 07/02/2021, 4:50 PM  [facility] Physical Therapy [address] [place], [place], [address] Phone: [phone]   Fax:  [phone]  Name: Ashley Moore MRN: [id number] Date of Birth: 05/14/1951

## 2021-07-02 NOTE — Patient Instructions (Signed)
Access Code: [email or web address] URL: [email or web address] Date: 07/02/2021 Prepared by: Vista Mink  Exercises Supine Scapular Protraction in Flexion with Dumbbells - 2-3 x daily - 7 x weekly - 1 sets - 20 reps - 3 seconds hold Standing Scapular Retraction - 5 x daily - 7 x weekly - 1 sets - 5 reps - 5 second hold Standing Isometric Shoulder External Rotation with Doorway and Towel Roll - 3-5 x daily - 7 x weekly - 1 sets - 10 reps - 5 seconds hold Standing Isometric Shoulder Internal Rotation with Towel Roll at Doorway - 3-5 x daily - 7 x weekly - 1 sets - 10 reps - 5 seconds hold

## 2021-07-04 NOTE — Progress Notes (Signed)
So  your thyroid tests are still showing  not enough thyroid medication. And if you have been taking  regularly since end of August  then I think we should increase does of medication.  From 50 mcg per day to 75 mcg per day .  Please  rx  synthroid/ levothyroxine  75 mcg  disp 90 refill x 1   Plan  tsh free t4 in approx 3 months)( place orders please)

## 2021-07-05 DIAGNOSIS — H43391 Other vitreous opacities, right eye: Secondary | ICD-10-CM | POA: Diagnosis not present

## 2021-07-05 DIAGNOSIS — H2513 Age-related nuclear cataract, bilateral: Secondary | ICD-10-CM | POA: Diagnosis not present

## 2021-07-05 DIAGNOSIS — H40013 Open angle with borderline findings, low risk, bilateral: Secondary | ICD-10-CM | POA: Diagnosis not present

## 2021-07-05 DIAGNOSIS — H524 Presbyopia: Secondary | ICD-10-CM | POA: Diagnosis not present

## 2021-07-05 MED ORDER — LEVOTHYROXINE SODIUM 75 MCG PO TABS: 75.0000 ug | ORAL_TABLET | Freq: Every day | ORAL | 1 refills | Status: DC

## 2021-07-13 ENCOUNTER — Encounter: Payer: Medicare HMO | Admitting: Rehabilitative and Restorative Service Providers"

## 2021-07-15 ENCOUNTER — Ambulatory Visit: Payer: Medicare HMO | Admitting: Rehabilitative and Restorative Service Providers"

## 2021-07-15 ENCOUNTER — Other Ambulatory Visit: Payer: Self-pay

## 2021-07-15 ENCOUNTER — Encounter: Payer: Self-pay | Admitting: Rehabilitative and Restorative Service Providers"

## 2021-07-15 DIAGNOSIS — M25611 Stiffness of right shoulder, not elsewhere classified: Secondary | ICD-10-CM

## 2021-07-15 DIAGNOSIS — M6281 Muscle weakness (generalized): Secondary | ICD-10-CM | POA: Diagnosis not present

## 2021-07-15 DIAGNOSIS — R600 Localized edema: Secondary | ICD-10-CM | POA: Diagnosis not present

## 2021-07-15 DIAGNOSIS — M25511 Pain in right shoulder: Secondary | ICD-10-CM | POA: Diagnosis not present

## 2021-07-15 DIAGNOSIS — G8929 Other chronic pain: Secondary | ICD-10-CM

## 2021-07-15 NOTE — Therapy (Signed)
[facility] [address] [place], [place], [address] Phone: [phone]   Fax:  [phone]  Physical Therapy Treatment  Patient Details  Name: Ashley Moore MRN: [id number] Date of Birth: 01-15-1951 Referring Provider (PT): Newt Minion MD   Encounter Date: 07/15/2021   PT End of Session - 07/15/21 1010     Visit Number 2    Number of Visits 16    Date for PT Re-Evaluation 08/27/21    Authorization Type [employer]    Authorization Time Period 07/02/2021 - 08/18/2021    Authorization - Visit Number 2    Authorization - Number of Visits 12    PT Start Time 1012    PT Stop Time 9244    PT Time Calculation (min) 39 min    Activity Tolerance Patient tolerated treatment well    Behavior During Therapy [facility] for tasks assessed/performed             Past Medical History:  Diagnosis Date   Arthritis    Bowen's disease    excised 1992   Chronic low back pain    Chronic radiation cystitis    DDD (degenerative disc disease), cervical    Family history of breast cancer    Family history of lung cancer    Family history of non-Hodgkin's lymphoma    Family history of ovarian cancer    Headache    Hx of varicella    Hydronephrosis of right kidney    urologist--- dr Tresa Moore,  malignant,  treated with ureter stent   Hypothyroidism    followed by pcp   Lower urinary tract symptoms (LUTS)    01-12-2021  per pt treated with accupuncture at dr Tresa Moore office   Lymphedema of both lower extremities    PICC (peripherally inserted central catheter) in place 01/11/2021   placed at 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   Sepsis due to Klebsiella pneumoniae ([place]) 01/07/2021   pt admitted to [facility] in [place],  dx sepsis secondary to acute pyelonephritis with bacteremia , positive blood culture,  discharged 01-11-2021 home daily IV antibiotic   Wears glasses     Past Surgical History:  Procedure Laterality Date   BUNIONECTOMY Right yrs ago   [place] Right 06/12/2020   Procedure: CYSTOSCOPY WITH RETROGRADE PYELOGRAM/URETERAL STENT EXCHANGE;  Surgeon: Alexis Frock, MD;  Location: [facility];  Service: Urology;  Laterality: Right;   CYSTOSCOPY W/ URETERAL STENT PLACEMENT Right 01/13/2021   Procedure: CYSTOSCOPY WITH RETROGRADE PYELOGRAM/URETERAL STENT REMOVALRIGHT;  Surgeon: Alexis Frock, MD;  Location: [facility];  Service: Urology;  Laterality: Right;  [place], URETEROSCOPY AND STENT PLACEMENT Right 01/28/2020   Procedure: CYSTOSCOPY WITH RETROGRADE PYELOGRAM, URETEROSCOPY AND STENT PLACEMENT;  Surgeon: Alexis Frock, MD;  [facility];  Service: Urology;  Laterality: Right;   IR GENERIC HISTORICAL  11/14/2016   IR US GUIDE VASC ACCESS RIGHT 11/14/2016 [facility]   IR GENERIC HISTORICAL  11/14/2016   IR FLUORO GUIDE CV  LINE RIGHT 11/14/2016 [facility]   IR GENERIC HISTORICAL  12/12/2016   IR US GUIDE VASC ACCESS RIGHT 12/12/2016 [name], MD [facility]   IR GENERIC HISTORICAL  12/12/2016   IR FLUORO GUIDE CV LINE RIGHT 12/12/2016 [name], MD [facility]   SKIN CANCER EXCISION  1990   bowens disease    There were no vitals filed for this visit.   Subjective Assessment - 07/15/21 1013     Subjective Pt. indicated trying to walk with Rt arm at side but its been troublesome.  Up and down at night because of shoulder.  Rated 4-5/10  today.    Pertinent History Active metastatic cancer, cervical and lumbar DDD    Limitations Lifting;House hold activities    Patient Stated Goals Return to work and exercise with use of her Rt shoulder    Currently in Pain? Yes    Pain Score 4     Pain Location Shoulder    Pain Orientation Right    Pain Descriptors / Indicators Aching;Sore;Sharp    Pain Type Chronic pain    Pain Onset More than a month ago    Pain Frequency Constant    Aggravating Factors  walking with arm at side, lifting arm    Pain Relieving Factors resting                [facility] PT Assessment - 07/15/21 0001       Assessment   Medical Diagnosis Rt shoulder pain    Referring Provider (PT) Newt Minion MD    Onset Date/Surgical Date 12/18/20   approx date of fall     AROM   Right Shoulder Flexion 145 Degrees   in supine   Right Shoulder Internal Rotation 59 Degrees   in supine 45 deg abduction   Right Shoulder External Rotation --   in supine 45 deg abduction                          [facility] Adult PT Treatment/Exercise - 07/15/21 0001       Exercises   Exercises Other Exercises    Other Exercises  HEP review c cues, printout provided      Shoulder Exercises: Supine   Protraction Strengthening;5 reps   3 sec holds (review for HEP)   Flexion AAROM;Both   1 lb bar x 15 movement to tolerance   Other Supine Exercises supine ER/IR 2 min long duration stretching in 45 deg (cues for home)    Other Supine Exercises supine 90 deg flexion circles cw, ccw 30 x 1 each way      Shoulder Exercises: Seated   Retraction Other (comment)   Reviewed verball for HEP(good knowledge)     Shoulder Exercises: ROM/Strengthening   UBE (Upper Arm Bike) Lvl 1.5 1 min forward (stopped due to pain), 4 mins backward      Shoulder Exercises: Isometric Strengthening   External Rotation Other (comment)   5 seconds x 10 standing   Internal Rotation Other (comment)   5 seconds x 10 standing     Manual Therapy    Manual therapy comments Contract/relax techniques for ER/IR mobility gains in 45 deg abduction, g3 inferior joint mobs in mid range for pain relief.                     PT Education - 07/15/21 1046     Education Details HEP update, long duration stretching  benefit    Person(s) Educated Patient    Methods Explanation;Demonstration;Verbal cues;Handout    Comprehension Verbalized understanding;Returned demonstration              PT Short Term Goals - 07/02/21 1643       PT SHORT TERM GOAL #1   Title Ashley Moore improve her R shoulder PROM (supine) for flexion to 150 degrees; IR to 50 degrees; ER to 60 degrees and horizontal adduction to 30 degrees.    Baseline 140; 40; 40; 20    Time 6    Period Weeks    Status New    Target Date 08/13/21               PT Long Term Goals - 07/02/21 1645       PT LONG TERM GOAL #1   Title Improve FOTO to 59.    Baseline 45    Time 8    Period Weeks    Status New    Target Date 08/27/21      PT LONG TERM GOAL #2   Title Ashley Moore report less shoulder pain at rest and less frequent "stabbing" pain with R UE use.    Baseline Aches all the time and frequent stabbing with movement.    Time 8    Period Weeks    Status New    Target Date 08/27/21      PT LONG TERM GOAL #3   Title Improve R shoulder strength as assessed by objective strength measures and FOTO scores.    Baseline ER 0 pounds and IR 7.2 pounds strength testing.    Time 8    Period Weeks    Status New    Target Date 08/27/21      PT LONG TERM GOAL #4   Title Ashley Moore be independent with her long-term maintenance HEP at DC.    Baseline Prescribed today.    Time 8    Period Weeks    Status New    Target Date 08/27/21                   Plan - 07/15/21 1030     Clinical Impression Statement Joint mobility in early to mid range showed minimal restriction.  Myofascial restriction and guarding most noted in ER/IR assessment.  Very positive  improvement noted in ER/IR c application of contract/relax techniques and prolonged low load stretching to give time for muscle relaxation.  Encouraged use similar at home for mobility gains.  Active movements continue to be troublesome secondary to pain and weakness.    Personal Factors and Comorbidities Comorbidity 3+    Comorbidities Metastatic cancer, cervical and lumbar DDD    Examination-Activity Limitations Bathing;Reach Overhead;Dressing;Hygiene/Grooming;Sleep;Lift;Carry    Examination-Participation Restrictions Occupation;Community Activity    Stability/Clinical Decision Making Stable/Uncomplicated    Rehab Potential Good    PT Frequency 2x / week    PT Duration 8 weeks    PT Treatment/Interventions ADLs/Self Care Home Management;Cryotherapy;Electrical Stimulation;Therapeutic activities;Therapeutic exercise;Neuromuscular re-education;Patient/family education;Manual techniques;Passive range of motion    PT Next Visit Plan High recommendation for contract/relax long duration ER/IR stretching as tolerated in manual and HEP. AAROM transitioning as tolerated in gravity reduced positioning.    PT Home Exercise Plan [email or web address]    Consulted and Agree with Plan of Care Patient             Patient Moore benefit from skilled therapeutic intervention in order to improve the following deficits and impairments:  Decreased activity tolerance, Decreased endurance, Decreased mobility, Decreased range of motion, Decreased strength, Hypomobility, Increased edema, Impaired UE functional use, Pain  Visit Diagnosis: Muscle weakness (generalized)  Stiffness of right shoulder, not elsewhere classified  Chronic right shoulder pain  Localized edema     Problem List Patient Active Problem List   Diagnosis Date Noted   Atypical chest pain 06/29/2021   Intractable abdominal pain 05/16/2021   Acute pyelonephritis 05/16/2021   Acute cystitis without hematuria 05/16/2021   Hydronephrosis of right  kidney 05/16/2021   Obstructive uropathy 05/16/2021   Hypothyroidism 05/16/2021   Hyponatremia 05/16/2021   Obesity (BMI 30-39.9) 05/16/2021   Goals of care, counseling/discussion 03/01/2021   SIRS (systemic inflammatory response syndrome) ([place]) 01/13/2021   Pyelonephritis 01/13/2021   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   Genetic testing 01/27/2020   Family history of breast cancer    Family history of lung cancer    Family history of ovarian cancer    Family history of non-Hodgkin's lymphoma    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   Port catheter in place 11/21/2016   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   Anal cancer ([place]) 11/03/2016   Cystitis 05/06/2015   Degenerative cervical disc 04/08/2015   Trigger point of neck 03/26/2015   Pain in joint, ankle and foot 04/23/2013   Visit for preventive health examination 02/13/2013   Routine gynecological examination 02/13/2013   Family history of breast cancer in first degree relative 02/13/2013   Rash and nonspecific skin eruption 02/13/2013   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   VITAMIN D DEFICIENCY 06/25/2008   NECK PAIN 06/02/2008   FOOT PAIN 06/02/2008   Osteopenia 06/02/2008    Scot Jun, PT, DPT, OCS, ATC 07/15/21  10:54 AM    [facility] [address] [place], [place], [address] Phone: [phone]   Fax:  [phone]  Name: CATELIN MANTHE MRN: [id number] Date of Birth: 1951-01-02

## 2021-07-15 NOTE — Patient Instructions (Signed)
Access Code: [email or web address] URL: [email or web address] Date: 07/15/2021 Prepared by: Scot Jun  Exercises Supine Scapular Protraction in Flexion with Dumbbells - 2-3 x daily - 7 x weekly - 1 sets - 20 reps - 3 seconds hold Standing Scapular Retraction - 5 x daily - 7 x weekly - 1 sets - 5 reps - 5 second hold Standing Isometric Shoulder External Rotation with Doorway and Towel Roll - 3-5 x daily - 7 x weekly - 1 sets - 10 reps - 5 seconds hold Standing Isometric Shoulder Internal Rotation with Towel Roll at Doorway - 3-5 x daily - 7 x weekly - 1 sets - 10 reps - 5 seconds hold Supine Shoulder External Rotation Stretch - 2-3 x daily - 7 x weekly - 1 sets - 1 reps - 1-2 mins hold Supine Shoulder Internal Rotation Stretch - 2-3 x daily - 7 x weekly - 1 sets - 1 reps - 1-2 min hold

## 2021-07-18 ENCOUNTER — Other Ambulatory Visit: Payer: Self-pay | Admitting: Oncology

## 2021-07-20 ENCOUNTER — Ambulatory Visit: Payer: Medicare HMO | Admitting: Rehabilitative and Restorative Service Providers"

## 2021-07-20 ENCOUNTER — Encounter: Payer: Self-pay | Admitting: Rehabilitative and Restorative Service Providers"

## 2021-07-20 ENCOUNTER — Other Ambulatory Visit: Payer: Self-pay

## 2021-07-20 DIAGNOSIS — R600 Localized edema: Secondary | ICD-10-CM

## 2021-07-20 DIAGNOSIS — M25611 Stiffness of right shoulder, not elsewhere classified: Secondary | ICD-10-CM

## 2021-07-20 DIAGNOSIS — M25511 Pain in right shoulder: Secondary | ICD-10-CM

## 2021-07-20 DIAGNOSIS — G8929 Other chronic pain: Secondary | ICD-10-CM | POA: Diagnosis not present

## 2021-07-20 DIAGNOSIS — M6281 Muscle weakness (generalized): Secondary | ICD-10-CM | POA: Diagnosis not present

## 2021-07-20 NOTE — Therapy (Signed)
[facility] [address] [place], [place], [address] Phone: [phone]   Fax:  [phone]  Physical Therapy Treatment  Patient Details  Name: Ashley Moore MRN: [id number] Date of Birth: 01-22-1951 Referring Provider (PT): Newt Minion MD   Encounter Date: 07/20/2021   PT End of Session - 07/20/21 1437     Visit Number 3    Number of Visits 16    Date for PT Re-Evaluation 08/27/21    Authorization Type [employer]    Authorization Time Period 07/02/2021 - 08/18/2021    Authorization - Visit Number 2    Authorization - Number of Visits 12    PT Start Time 1430    PT Stop Time 1510    PT Time Calculation (min) 40 min    Activity Tolerance Patient limited by pain    Behavior During Therapy [facility] for tasks assessed/performed             Past Medical History:  Diagnosis Date   Arthritis    Bowen's disease    excised 1992   Chronic low back pain    Chronic radiation cystitis    DDD (degenerative disc disease), cervical    Family history of breast cancer    Family history of lung cancer    Family history of non-Hodgkin's lymphoma    Family history of ovarian cancer    Headache    Hx of varicella    Hydronephrosis of right kidney    urologist--- dr Tresa Moore,  malignant,  treated with ureter stent   Hypothyroidism    followed by pcp   Lower urinary tract symptoms (LUTS)    01-12-2021  per pt treated with accupuncture at dr Tresa Moore office   Lymphedema of both lower extremities    PICC (peripherally inserted central catheter) in place 01/11/2021   placed at [facility] in [place], [place] for IV antibiotics   Positive blood culture 01/08/2021   Klebsiella Pneumaniae,  treated with daily IV antibiotic   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[facility]) oncologist--- dr Benay Spice   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,  invasive SCC , completed chemo/ radiation 12-23-2016;  recurrent metastatsis retroperitoneal lymphdenopathy,  completed radiation 04-13-2020 residual right ureteral  uropathy obstruction   Retroperitoneal lymphadenopathy    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   Sepsis due to Klebsiella pneumoniae ([place]) 01/07/2021   pt admitted to [facility] in [place],  dx sepsis secondary to acute pyelonephritis with bacteremia , positive blood culture,  discharged 01-11-2021 home daily IV antibiotic   Wears glasses     Past Surgical History:  Procedure Laterality Date   BUNIONECTOMY Right yrs ago   [place] Right 06/12/2020   Procedure: CYSTOSCOPY WITH RETROGRADE PYELOGRAM/URETERAL STENT EXCHANGE;  Surgeon: Alexis Frock, MD;  Location: [facility];  Service: Urology;  Laterality: Right;   CYSTOSCOPY W/ URETERAL STENT PLACEMENT Right 01/13/2021   Procedure: CYSTOSCOPY WITH RETROGRADE PYELOGRAM/URETERAL STENT REMOVALRIGHT;  Surgeon: Alexis Frock, MD;  Location: [facility];  Service: Urology;  Laterality: Right;  [place], URETEROSCOPY AND STENT PLACEMENT Right 01/28/2020   Procedure: CYSTOSCOPY WITH RETROGRADE PYELOGRAM, URETEROSCOPY AND STENT PLACEMENT;  Surgeon: Alexis Frock, MD;  [facility];  Service: Urology;  Laterality: Right;   IR GENERIC HISTORICAL  11/14/2016   IR US GUIDE VASC ACCESS RIGHT 11/14/2016 [facility]   IR GENERIC HISTORICAL  11/14/2016   IR FLUORO GUIDE CV  LINE RIGHT 11/14/2016 [facility]   IR GENERIC HISTORICAL  12/12/2016   IR US GUIDE VASC ACCESS RIGHT 12/12/2016 [name], MD [facility]   IR GENERIC HISTORICAL  12/12/2016   IR FLUORO GUIDE CV LINE RIGHT 12/12/2016 [name], MD [facility]   SKIN CANCER EXCISION  1990   bowens disease    There were no vitals filed for this visit.   Subjective Assessment - 07/20/21 1437     Subjective Pt. stated no pain at rest in waiting room.  Reported 7/10 pain c movement today.  Pt. indicated feeling complaints of soreness and pain off  and on in last few days, noted increased c rain.    Pertinent History Active metastatic cancer, cervical and lumbar DDD    Limitations Lifting;House hold activities    Patient Stated Goals Return to work and exercise with use of her Rt shoulder    Currently in Pain? Yes    Pain Score 7    7/10 at worst today   Pain Location Shoulder    Pain Orientation Right    Pain Descriptors / Indicators Aching;Sore    Pain Type Chronic pain    Pain Onset More than a month ago    Pain Frequency Intermittent    Aggravating Factors  rain day, arm movements, end ranges    Pain Relieving Factors rest                               [facility] Adult PT Treatment/Exercise - 07/20/21 0001       Shoulder Exercises: Supine   Other Supine Exercises supine AROM flexion/extension Rt elbow      Shoulder Exercises: Seated   Other Seated Exercises seated isometric holds against clinician pressure painfree 5 sec hold x 15 IR/ER in neutral    Other Seated Exercises Rt elbow flexion/extension x 1 (moved to supine)      Shoulder Exercises: Standing   Row Both   green band x 9     Shoulder Exercises: ROM/Strengthening   UBE (Upper Arm Bike) Lvl 1 4 mins reverse (still limited due to pain forward)      Manual Therapy   Manual therapy comments Contract/relax techniques for ER/IR mobility gains in 45 deg abduction, g3 inferior joint mobs in mid range for pain relief.                       PT Short Term Goals - 07/20/21 1452       PT SHORT TERM GOAL #1   Title Tye Maryland will improve her R shoulder PROM (supine) for flexion to 150 degrees; IR to 50 degrees; ER to 60 degrees and horizontal adduction to 30 degrees.    Baseline 140; 40; 40; 20    Time 6    Period Weeks    Status On-going    Target Date 08/13/21               PT Long Term Goals - 07/02/21 1645       PT LONG TERM GOAL #1   Title Improve FOTO to 59.    Baseline 45    Time 8    Period Weeks    Status New     Target Date 08/27/21      PT LONG TERM GOAL #2   Title Tye Maryland will report less shoulder pain at rest and less frequent "stabbing" pain with R  UE use.    Baseline Aches all the time and frequent stabbing with movement.    Time 8    Period Weeks    Status New    Target Date 08/27/21      PT LONG TERM GOAL #3   Title Improve R shoulder strength as assessed by objective strength measures and FOTO scores.    Baseline ER 0 pounds and IR 7.2 pounds strength testing.    Time 8    Period Weeks    Status New    Target Date 08/27/21      PT LONG TERM GOAL #4   Title Tye Maryland will be independent with her long-term maintenance HEP at DC.    Baseline Prescribed today.    Time 8    Period Weeks    Status New    Target Date 08/27/21                   Plan - 07/20/21 1455     Clinical Impression Statement Noted pain limitations in shoulder/elbow movements within clinic activity today , requiring consistent adaptations and adjustments of plan for intervention.  Slow and steady approach in manual and ther ex to promote improved mobility and strength.  Continued skilled PT indicated at this time.    Personal Factors and Comorbidities Comorbidity 3+    Comorbidities Metastatic cancer, cervical and lumbar DDD    Examination-Activity Limitations Bathing;Reach Overhead;Dressing;Hygiene/Grooming;Sleep;Lift;Carry    Examination-Participation Restrictions Occupation;Community Activity    Stability/Clinical Decision Making Stable/Uncomplicated    Rehab Potential Good    PT Frequency 2x / week    PT Duration 8 weeks    PT Treatment/Interventions ADLs/Self Care Home Management;Cryotherapy;Electrical Stimulation;Therapeutic activities;Therapeutic exercise;Neuromuscular re-education;Patient/family education;Manual techniques;Passive range of motion    PT Next Visit Plan Slow progression due to high severity/irritability    PT Home Exercise Plan [email or web address]    Consulted and Agree with Plan of Care Patient              Patient will benefit from skilled therapeutic intervention in order to improve the following deficits and impairments:  Decreased activity tolerance, Decreased endurance, Decreased mobility, Decreased range of motion, Decreased strength, Hypomobility, Increased edema, Impaired UE functional use, Pain  Visit Diagnosis: Muscle weakness (generalized)  Stiffness of right shoulder, not elsewhere classified  Chronic right shoulder pain  Localized edema     Problem List Patient Active Problem List   Diagnosis Date Noted   Atypical chest pain 06/29/2021   Intractable abdominal pain 05/16/2021   Acute pyelonephritis 05/16/2021   Acute cystitis without hematuria 05/16/2021   Hydronephrosis of right kidney 05/16/2021   Obstructive uropathy 05/16/2021   Hypothyroidism 05/16/2021   Hyponatremia 05/16/2021   Obesity (BMI 30-39.9) 05/16/2021   Goals of care, counseling/discussion 03/01/2021   SIRS (systemic inflammatory response syndrome) ([place]) 01/13/2021   Pyelonephritis 01/13/2021   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   Genetic testing 01/27/2020   Family history of breast cancer    Family history of lung cancer    Family history of ovarian cancer    Family history of non-Hodgkin's lymphoma    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   Port catheter in place 11/21/2016   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   Anal cancer ([place]) 11/03/2016   Cystitis 05/06/2015   Degenerative cervical disc 04/08/2015   Trigger point of neck 03/26/2015   Pain in joint, ankle and foot 04/23/2013   Visit for preventive health examination 02/13/2013   Routine  gynecological examination 02/13/2013   Family history of breast cancer in first degree relative 02/13/2013   Rash and nonspecific skin eruption 02/13/2013   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   VITAMIN D DEFICIENCY 06/25/2008   NECK PAIN 06/02/2008   FOOT PAIN 06/02/2008   Osteopenia 06/02/2008     Scot Jun, PT, DPT, OCS, ATC 07/20/21  3:11 PM    [facility] [address] [place], [place], [address] Phone: [phone]   Fax:  [phone]  Name: JESSIE COWHER MRN: [id number] Date of Birth: 1951/09/08

## 2021-07-22 ENCOUNTER — Inpatient Hospital Stay: Payer: Medicare HMO

## 2021-07-22 ENCOUNTER — Inpatient Hospital Stay: Payer: Medicare HMO | Admitting: Oncology

## 2021-07-22 ENCOUNTER — Other Ambulatory Visit: Payer: Self-pay

## 2021-07-22 ENCOUNTER — Inpatient Hospital Stay: Payer: Medicare HMO | Attending: Nurse Practitioner

## 2021-07-22 VITALS — BP 131/80 | HR 93 | Temp 97.8°F | Resp 19 | Wt 176.2 lb

## 2021-07-22 DIAGNOSIS — C770 Secondary and unspecified malignant neoplasm of lymph nodes of head, face and neck: Secondary | ICD-10-CM | POA: Diagnosis not present

## 2021-07-22 DIAGNOSIS — Z5111 Encounter for antineoplastic chemotherapy: Secondary | ICD-10-CM | POA: Diagnosis not present

## 2021-07-22 DIAGNOSIS — Z23 Encounter for immunization: Secondary | ICD-10-CM

## 2021-07-22 DIAGNOSIS — C210 Malignant neoplasm of anus, unspecified: Secondary | ICD-10-CM

## 2021-07-22 DIAGNOSIS — D701 Agranulocytosis secondary to cancer chemotherapy: Secondary | ICD-10-CM | POA: Diagnosis not present

## 2021-07-22 LAB — CBC WITH DIFFERENTIAL (CANCER CENTER ONLY)
Abs Immature Granulocytes: 0 10*3/uL (ref 0.00–0.07)
Basophils Absolute: 0 10*3/uL (ref 0.0–0.1)
Basophils Relative: 1 %
Eosinophils Absolute: 0.1 10*3/uL (ref 0.0–0.5)
Eosinophils Relative: 3 %
HCT: 31.6 % — ABNORMAL LOW (ref 36.0–46.0)
Hemoglobin: 9.8 g/dL — ABNORMAL LOW (ref 12.0–15.0)
Immature Granulocytes: 0 %
Lymphocytes Relative: 19 %
Lymphs Abs: 0.4 10*3/uL — ABNORMAL LOW (ref 0.7–4.0)
MCH: 29.2 pg (ref 26.0–34.0)
MCHC: 31 g/dL (ref 30.0–36.0)
MCV: 94 fL (ref 80.0–100.0)
Monocytes Absolute: 0.4 10*3/uL (ref 0.1–1.0)
Monocytes Relative: 19 %
Neutro Abs: 1.2 10*3/uL — ABNORMAL LOW (ref 1.7–7.7)
Neutrophils Relative %: 58 %
Platelet Count: 138 10*3/uL — ABNORMAL LOW (ref 150–400)
RBC: 3.36 MIL/uL — ABNORMAL LOW (ref 3.87–5.11)
RDW: 15.4 % (ref 11.5–15.5)
WBC Count: 2.1 10*3/uL — ABNORMAL LOW (ref 4.0–10.5)
nRBC: 0 % (ref 0.0–0.2)

## 2021-07-22 LAB — CMP (CANCER CENTER ONLY)
ALT: 11 U/L (ref 0–44)
AST: 22 U/L (ref 15–41)
Albumin: 3.8 g/dL (ref 3.5–5.0)
Alkaline Phosphatase: 73 U/L (ref 38–126)
Anion gap: 8 (ref 5–15)
BUN: 21 mg/dL (ref 8–23)
CO2: 25 mmol/L (ref 22–32)
Calcium: 9 mg/dL (ref 8.9–10.3)
Chloride: 101 mmol/L (ref 98–111)
Creatinine: 0.93 mg/dL (ref 0.44–1.00)
GFR, Estimated: >60 mL/min (ref >60)
Glucose, Bld: 77 mg/dL (ref 70–99)
Potassium: 4.1 mmol/L (ref 3.5–5.1)
Sodium: 134 mmol/L — ABNORMAL LOW (ref 135–145)
Total Bilirubin: 0.2 mg/dL — ABNORMAL LOW (ref 0.3–1.2)
Total Protein: 7.3 g/dL (ref 6.5–8.1)

## 2021-07-22 MED ORDER — INFLUENZA VAC A&B SA ADJ QUAD 0.5 ML IM PRSY
0.5000 mL | PREFILLED_SYRINGE | Freq: Once | INTRAMUSCULAR | Status: AC
  Administered 2021-07-22: 0.5 mL via INTRAMUSCULAR
  Filled 2021-07-22: qty 0.5

## 2021-07-22 NOTE — Progress Notes (Signed)
[facility] OFFICE PROGRESS NOTE   Diagnosis: Anal cancer  INTERVAL HISTORY:   Ashley Moore completed another cycle of carboplatin on 07/01/2021.  She had nausea following chemotherapy.  She complains of bilateral leg swelling.  She feels the left neck lymph nodes are smaller.  She reports persistent numbness in the feet.  Objective:  Vital signs in last 24 hours:  Blood pressure 131/80, pulse 93, temperature 97.8 F (36.6 C), temperature source Temporal, resp. rate 19, weight 176 lb 3.2 oz (79.9 kg), SpO2 100 %.    HEENT: No thrush or ulcers Lymphatics: 1-2 cm mobile left scalene and medial left supraclavicular nodes Resp: Scattered end inspiratory fine rales, no respiratory distress Cardio: Regular rate and rhythm GI: No hepatosplenomegaly, nontender Vascular: 1-2+ pitting edema in the legs bilaterally, extending above the knee, no edema at the waistline   Lab Results:  Lab Results  Component Value Date   WBC 2.1 (L) 07/22/2021   HGB 9.8 (L) 07/22/2021   HCT 31.6 (L) 07/22/2021   MCV 94.0 07/22/2021   PLT 138 (L) 07/22/2021   NEUTROABS 1.2 (L) 07/22/2021    CMP  Lab Results  Component Value Date   NA 135 07/01/2021   K 4.4 07/01/2021   CL 103 07/01/2021   CO2 22 07/01/2021   GLUCOSE 86 07/01/2021   BUN 25 (H) 07/01/2021   CREATININE 1.08 (H) 07/01/2021   CALCIUM 9.2 07/01/2021   PROT 7.1 07/01/2021   ALBUMIN 3.8 07/01/2021   AST 22 07/01/2021   ALT 10 07/01/2021   ALKPHOS 74 07/01/2021   BILITOT 0.2 (L) 07/01/2021   GFRNONAA 55 (L) 07/01/2021   GFRAA >60 06/17/2020    Lab Results  Component Value Date   CEA1 1.15 03/04/2021   CEA 1.16 03/04/2021     Medications: I have reviewed the patient's current medications.   Assessment/Plan:  Anal cancer CT abdomen/pelvis 10/21/2016-thickening of the anus extending to the junction between the anus and rectum with a large stool ball in the rectum and mild fat stranding posterior to the  rectum. Enlarged lymph nodes in the right and left inguinal regions. A few mildly prominent nodes seen posterior to the rectum. 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 PET scan [id number] hypermetabolic anal mass with hypermetabolic metastases to the groin region bilaterally, left pelvic sidewall and presacral space. Initiation of radiation and cycle 1 5-FU/mitomycin C 11/14/2016 Cycle 2 5-FU/mitomycin C 12/12/2016 (5-FU dose reduced due to mucositis, diarrhea, skin breakdown) Radiation completed 12/23/2016 CT abdomen/pelvis 04/14/2017-resolution of anal mass and bilateral inguinal lymphadenopathy. No residual tumor seen. CT abdomen/pelvis 01/28/2020-right hydroureteronephrosis, transition at the level of the mid right ureter, retroperitoneal lymphadenopathy CT renal stone study 01/28/2020-severe right hydronephrosis and proximal right hydroureter, 8 mm area of increased attenuation in the proximal right ureter-stone versus soft tissue mass PET 02/13/2020-hypermetabolic right retroperitoneal nodal metastases in the aortocaval and posterior pericaval chains, nonspecific mild anal wall hypermetabolism, right nephroureteral stent 02/21/2020 anorectal exam per Dr. Ashley Jacobs radiation changes of the skin around the anal margin.  Posterior midline smooth scarring.  Mildly stenotic.  Stenosis seems better.  No palpable concerning lesions.  Entire anal canal and distal rectum feels smooth and healthy.  Partial anoscopy performed with no lesions in the anal canal. Xeloda/radiation beginning 03/02/2020, completed 04/13/2020 CT abdomen/pelvis 06/17/2020-resolution of right retroperitoneal lymphadenopathy, no remaining pathologically enlarged lymph nodes, residual mild right hydronephrosis, no evidence of progressive disease, stable anal wall thickening Digital rectal exam by Dr. Quentin Cornwall 08/26/2020-posterior midline  smooth scarring.  Mildly stenotic.  Stenosis seems better.  No  palpable concerning lesions.  Anal canal feels smooth without palpable concern.  Unable to tolerate anoscopy.  Next visit in 6 months for surveillance. CT abdomen/pelvis 10/28/2020-no evidence of local recurrence or metastatic disease.  Stable mild rectal wall thickening.  New diffuse bladder wall thickening possibly related to prior radiation CT abdomen/pelvis 01/16/2021- recurrent right-sided hydronephrosis and proximal right hydroureter status post removal of right ureter stent, new soft tissue nodule along the course of the right ureter between the right psoas and IVC suspicious for recurrent lymphadenopathy PET 02/12/2021-small focus of hypermetabolism at the anorectal junction without a definable mass, right retroperitoneal nodal metastasis with obstruction of the proximal right ureter, right middle lobe hypermetabolic nodule, hypermetabolism in the right hilum without a defined nodal mass, hypermetabolic left supraclavicular and left axillary nodes 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 Cycle 1 Taxol/carboplatin 03/11/2021 Cycle 2 Taxol/carboplatin 03/31/2021, Fulphila Cycle 3 Taxol/carboplatin 04/21/2021, Fulphila CTs 05/11/2021-decrease size of FDG avid nodule in right middle lobe, right middle lobe subpleural nodule slightly enlarged, decreased in size of left axillary and subpectoral nodes, unchanged FDG avid right iliac nodes Cycle 4 Taxol/carboplatin 05/12/2021, G-CSF discontinued secondary to poor tolerance, Taxol and carboplatin dose reduced Cycle 5 Taxol/carboplatin 06/02/2021 Cycle 6 carboplatin 07/01/2021, Taxol held due to neuropathy Cycle 7 carboplatin 07/29/2021, Taxol held due to neuropathy Left labial lesions. Question direct extension from the anal cancer versus metastatic disease from anal cancer versus a separate malignant process. 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 Multiple family members with breast cancer. History of hypokalemia-likely secondary to decreased nutritional intake and diarrhea; potassium in normal range 01/16/2017. No longer taking a potassium supplement. Right hydroureteronephrosis on CT 01/28/2020, status post a cystoscopy/pyelogram 01/28/2020 confirming extrinsic compression of the right ureter, status post stent placement Right ureter stent exchange 06/12/2020 Right ureter stent removed 01/13/2021   8.  Admission 01/07/2021 with Klebsiella pneumoniae urosepsis/bacteremia  9.  Hospital admission 05/15/2021 with UTI/possible right pyelonephritis        Disposition: Ms. Perdue has mild neutropenia today.  We discussed the risk of developing severe neutropenia if we proceeded with chemotherapy.  We discussed dose reduction of chemotherapy and proceeding with G-CSF support.  She does not wish to receive G-CSF.  We decided to hold chemotherapy for 1 week.  Ms. Mccaffery will return for the next cycle of carboplatin on 07/29/2021.  Taxol will remain on hold.  She will undergo restaging CTs after this cycle of chemotherapy.  She will return for an office visit on 08/18/2021.  Ms. Tiede received an influenza vaccine today.  Betsy Coder, MD  07/22/2021  8:22 AM

## 2021-07-29 ENCOUNTER — Inpatient Hospital Stay: Payer: Medicare HMO

## 2021-07-29 ENCOUNTER — Other Ambulatory Visit: Payer: Self-pay

## 2021-07-29 VITALS — BP 139/80 | HR 94 | Temp 97.7°F | Resp 20 | Ht 62.0 in | Wt 176.1 lb

## 2021-07-29 DIAGNOSIS — C210 Malignant neoplasm of anus, unspecified: Secondary | ICD-10-CM

## 2021-07-29 DIAGNOSIS — Z23 Encounter for immunization: Secondary | ICD-10-CM | POA: Diagnosis not present

## 2021-07-29 DIAGNOSIS — C770 Secondary and unspecified malignant neoplasm of lymph nodes of head, face and neck: Secondary | ICD-10-CM | POA: Diagnosis not present

## 2021-07-29 DIAGNOSIS — Z5111 Encounter for antineoplastic chemotherapy: Secondary | ICD-10-CM | POA: Diagnosis not present

## 2021-07-29 DIAGNOSIS — D701 Agranulocytosis secondary to cancer chemotherapy: Secondary | ICD-10-CM | POA: Diagnosis not present

## 2021-07-29 LAB — CBC WITH DIFFERENTIAL (CANCER CENTER ONLY)
Abs Immature Granulocytes: 0.02 10*3/uL (ref 0.00–0.07)
Basophils Absolute: 0 10*3/uL (ref 0.0–0.1)
Basophils Relative: 0 %
Eosinophils Absolute: 0.1 10*3/uL (ref 0.0–0.5)
Eosinophils Relative: 2 %
HCT: 30.5 % — ABNORMAL LOW (ref 36.0–46.0)
Hemoglobin: 9.6 g/dL — ABNORMAL LOW (ref 12.0–15.0)
Immature Granulocytes: 0 %
Lymphocytes Relative: 9 %
Lymphs Abs: 0.4 10*3/uL — ABNORMAL LOW (ref 0.7–4.0)
MCH: 28.8 pg (ref 26.0–34.0)
MCHC: 31.5 g/dL (ref 30.0–36.0)
MCV: 91.6 fL (ref 80.0–100.0)
Monocytes Absolute: 0.6 10*3/uL (ref 0.1–1.0)
Monocytes Relative: 13 %
Neutro Abs: 3.6 10*3/uL (ref 1.7–7.7)
Neutrophils Relative %: 76 %
Platelet Count: 105 10*3/uL — ABNORMAL LOW (ref 150–400)
RBC: 3.33 MIL/uL — ABNORMAL LOW (ref 3.87–5.11)
RDW: 15.2 % (ref 11.5–15.5)
WBC Count: 4.7 10*3/uL (ref 4.0–10.5)
nRBC: 0 % (ref 0.0–0.2)

## 2021-07-29 MED ORDER — SODIUM CHLORIDE 0.9 % IV SOLN
150.0000 mg | Freq: Once | INTRAVENOUS | Status: AC
  Administered 2021-07-29: 150 mg via INTRAVENOUS
  Filled 2021-07-29: qty 5

## 2021-07-29 MED ORDER — PALONOSETRON HCL INJECTION 0.25 MG/5ML
0.2500 mg | Freq: Once | INTRAVENOUS | Status: AC
  Administered 2021-07-29: 0.25 mg via INTRAVENOUS
  Filled 2021-07-29: qty 5

## 2021-07-29 MED ORDER — SODIUM CHLORIDE 0.9 % IV SOLN
264.9000 mg | Freq: Once | INTRAVENOUS | Status: AC
  Administered 2021-07-29: 260 mg via INTRAVENOUS
  Filled 2021-07-29: qty 26

## 2021-07-29 MED ORDER — SODIUM CHLORIDE 0.9 % IV SOLN
10.0000 mg | Freq: Once | INTRAVENOUS | Status: AC
  Administered 2021-07-29: 10 mg via INTRAVENOUS
  Filled 2021-07-29: qty 1

## 2021-07-29 MED ORDER — FAMOTIDINE 20 MG IN NS 100 ML IVPB
20.0000 mg | Freq: Once | INTRAVENOUS | Status: AC
  Administered 2021-07-29: 20 mg via INTRAVENOUS
  Filled 2021-07-29: qty 100

## 2021-07-29 MED ORDER — SODIUM CHLORIDE 0.9 % IV SOLN: Freq: Once | INTRAVENOUS | Status: AC

## 2021-07-29 NOTE — Progress Notes (Signed)
Patient presents for treatment. RN assessment completed along with the following:  Labs/vitals reviewed - Yes, and within treatment parameters.   Weight within 10% of previous measurement - Yes Oncology Treatment Attestation completed for current therapy- Yes, on date 07/22/2021 Informed consent completed and reflects current therapy/intent - Yes, on date 03/11/2021             Provider progress note reviewed - Patient not seen by provider today. Most recent note dated 07/22/2021 reviewed. Treatment/Antibody/Supportive plan reviewed - Yes, and there are no adjustments needed for today's treatment. S&H and other orders reviewed - Yes, and there are no additional orders identified. Previous treatment date reviewed - Yes, and the appropriate amount of time has elapsed between treatments. Clinic Hand Off Received from - No.  Patient to proceed with treatment.

## 2021-07-29 NOTE — Patient Instructions (Signed)
Ashley Moore   Discharge Instructions: Thank you for choosing [facility] to provide your oncology and hematology care.   If you have a lab appointment with the [facility], please go directly to the [facility] and check in at the registration area.   Wear comfortable clothing and clothing appropriate for easy access to any Portacath or PICC line.   We strive to give you quality time with your provider. You may need to reschedule your appointment if you arrive late (15 or more minutes).  Arriving late affects you and other patients whose appointments are after yours.  Also, if you miss three or more appointments without notifying the office, you may be dismissed from the clinic at the provider's discretion.      For prescription refill requests, have your pharmacy contact our office and allow 72 hours for refills to be completed.    Today you received the following chemotherapy and/or immunotherapy agents Carboplatin (PARAPLATIN).      To help prevent nausea and vomiting after your treatment, we encourage you to take your nausea medication as directed.  BELOW ARE SYMPTOMS THAT SHOULD BE REPORTED IMMEDIATELY: *FEVER GREATER THAN 100.4 F (38 C) OR HIGHER *CHILLS OR SWEATING *NAUSEA AND VOMITING THAT IS NOT CONTROLLED WITH YOUR NAUSEA MEDICATION *UNUSUAL SHORTNESS OF BREATH *UNUSUAL BRUISING OR BLEEDING *URINARY PROBLEMS (pain or burning when urinating, or frequent urination) *BOWEL PROBLEMS (unusual diarrhea, constipation, pain near the anus) TENDERNESS IN MOUTH AND THROAT WITH OR WITHOUT PRESENCE OF ULCERS (sore throat, sores in mouth, or a toothache) UNUSUAL RASH, SWELLING OR PAIN  UNUSUAL VAGINAL DISCHARGE OR ITCHING   Items with * indicate a potential emergency and should be followed up as soon as possible or go to the Emergency Department if any problems should occur.  Please show the CHEMOTHERAPY ALERT CARD or IMMUNOTHERAPY ALERT CARD  at check-in to the Emergency Department and triage nurse.  Should you have questions after your visit or need to cancel or reschedule your appointment, please contact [facility]  Dept: [phone]  and follow the prompts.  Office hours are 8:00 a.m. to 4:30 p.m. Monday - Friday. Please note that voicemails left after 4:00 p.m. may not be returned until the following business day.  We are closed weekends and major holidays. You have access to a nurse at all times for urgent questions. Please call the main number to the clinic Dept: [phone] and follow the prompts.   For any non-urgent questions, you may also contact your provider using MyChart. We now offer e-Visits for anyone 36 and older to request care online for non-urgent symptoms. For details visit [email or web address].   Also download the MyChart app! Go to the app store, search "MyChart", open the app, select [facility], and log in with your MyChart username and password.  Due to Covid, a mask is required upon entering the hospital/clinic. If you do not have a mask, one will be given to you upon arrival. For doctor visits, patients may have 1 support person aged 72 or older with them. For treatment visits, patients cannot have anyone with them due to current Covid guidelines and our immunocompromised population.   Carboplatin injection What is this medication? CARBOPLATIN (KAR boe pla tin) is a chemotherapy drug. It targets fast dividing cells, like cancer cells, and causes these cells to die. This medicine is used to treat ovarian cancer and many other cancers. This medicine may be used for  other purposes; ask your health care provider or pharmacist if you have questions. COMMON BRAND NAME(S): Paraplatin What should I tell my care team before I take this medication? They need to know if you have any of these conditions: blood disorders hearing problems kidney disease recent or ongoing radiation  therapy an unusual or allergic reaction to carboplatin, cisplatin, other chemotherapy, other medicines, foods, dyes, or preservatives pregnant or trying to get pregnant breast-feeding How should I use this medication? This drug is usually given as an infusion into a vein. It is administered in a hospital or clinic by a specially trained health care professional. Talk to your pediatrician regarding the use of this medicine in children. Special care may be needed. Overdosage: If you think you have taken too much of this medicine contact a poison control center or emergency room at once. NOTE: This medicine is only for you. Do not share this medicine with others. What if I miss a dose? It is important not to miss a dose. Call your doctor or health care professional if you are unable to keep an appointment. What may interact with this medication? medicines for seizures medicines to increase blood counts like filgrastim, pegfilgrastim, sargramostim some antibiotics like amikacin, gentamicin, neomycin, streptomycin, tobramycin vaccines Talk to your doctor or health care professional before taking any of these medicines: acetaminophen aspirin ibuprofen ketoprofen naproxen This list may not describe all possible interactions. Give your health care provider a list of all the medicines, herbs, non-prescription drugs, or dietary supplements you use. Also tell them if you smoke, drink alcohol, or use illegal drugs. Some items may interact with your medicine. What should I watch for while using this medication? Your condition will be monitored carefully while you are receiving this medicine. You will need important blood work done while you are taking this medicine. This drug may make you feel generally unwell. This is not uncommon, as chemotherapy can affect healthy cells as well as cancer cells. Report any side effects. Continue your course of treatment even though you feel ill unless your doctor tells  you to stop. In some cases, you may be given additional medicines to help with side effects. Follow all directions for their use. Call your doctor or health care professional for advice if you get a fever, chills or sore throat, or other symptoms of a cold or flu. Do not treat yourself. This drug decreases your body's ability to fight infections. Try to avoid being around people who are sick. This medicine may increase your risk to bruise or bleed. Call your doctor or health care professional if you notice any unusual bleeding. Be careful brushing and flossing your teeth or using a toothpick because you may get an infection or bleed more easily. If you have any dental work done, tell your dentist you are receiving this medicine. Avoid taking products that contain aspirin, acetaminophen, ibuprofen, naproxen, or ketoprofen unless instructed by your doctor. These medicines may hide a fever. Do not become pregnant while taking this medicine. Women should inform their doctor if they wish to become pregnant or think they might be pregnant. There is a potential for serious side effects to an unborn child. Talk to your health care professional or pharmacist for more information. Do not breast-feed an infant while taking this medicine. What side effects may I notice from receiving this medication? Side effects that you should report to your doctor or health care professional as soon as possible: allergic reactions like skin rash, itching or  hives, swelling of the face, lips, or tongue signs of infection - fever or chills, cough, sore throat, pain or difficulty passing urine signs of decreased platelets or bleeding - bruising, pinpoint red spots on the skin, black, tarry stools, nosebleeds signs of decreased red blood cells - unusually weak or tired, fainting spells, lightheadedness breathing problems changes in hearing changes in vision chest pain high blood pressure low blood counts - This drug may  decrease the number of white blood cells, red blood cells and platelets. You may be at increased risk for infections and bleeding. nausea and vomiting pain, swelling, redness or irritation at the injection site pain, tingling, numbness in the hands or feet problems with balance, talking, walking trouble passing urine or change in the amount of urine Side effects that usually do not require medical attention (report to your doctor or health care professional if they continue or are bothersome): hair loss loss of appetite metallic taste in the mouth or changes in taste This list may not describe all possible side effects. Call your doctor for medical advice about side effects. You may report side effects to FDA at 1-800-FDA-1088. Where should I keep my medication? This drug is given in a hospital or clinic and will not be stored at home. NOTE: This sheet is a summary. It may not cover all possible information. If you have questions about this medicine, talk to your doctor, pharmacist, or health care provider.  2022 [employer]/Gold Standard (2008-02-13 00:00:00)

## 2021-07-30 ENCOUNTER — Other Ambulatory Visit: Payer: Self-pay

## 2021-07-30 DIAGNOSIS — C210 Malignant neoplasm of anus, unspecified: Secondary | ICD-10-CM

## 2021-08-05 ENCOUNTER — Other Ambulatory Visit: Payer: Self-pay

## 2021-08-05 ENCOUNTER — Encounter: Payer: Self-pay | Admitting: Rehabilitative and Restorative Service Providers"

## 2021-08-05 ENCOUNTER — Ambulatory Visit: Payer: Medicare HMO | Admitting: Rehabilitative and Restorative Service Providers"

## 2021-08-05 DIAGNOSIS — M25511 Pain in right shoulder: Secondary | ICD-10-CM | POA: Diagnosis not present

## 2021-08-05 DIAGNOSIS — M25611 Stiffness of right shoulder, not elsewhere classified: Secondary | ICD-10-CM

## 2021-08-05 DIAGNOSIS — G8929 Other chronic pain: Secondary | ICD-10-CM

## 2021-08-05 DIAGNOSIS — M6281 Muscle weakness (generalized): Secondary | ICD-10-CM | POA: Diagnosis not present

## 2021-08-05 DIAGNOSIS — R600 Localized edema: Secondary | ICD-10-CM

## 2021-08-05 NOTE — Therapy (Signed)
[facility] [address] [place], [place], [address] Phone: [phone]   Fax:  [phone]  Physical Therapy Treatment  Patient Details  Name: Ashley Moore MRN: [id number] Date of Birth: 1950/10/22 Referring Provider (PT): Newt Minion MD   Encounter Date: 08/05/2021   PT End of Session - 08/05/21 1510     Visit Number 4    Number of Visits 16    Date for PT Re-Evaluation 08/27/21    Authorization Type [employer]    Authorization Time Period 07/02/2021 - 08/18/2021    Authorization - Visit Number 4    Authorization - Number of Visits 12    PT Start Time 2706    PT Stop Time 1550    PT Time Calculation (min) 39 min    Activity Tolerance Patient limited by pain    Behavior During Therapy [facility] for tasks assessed/performed             Past Medical History:  Diagnosis Date   Arthritis    Bowen's disease    excised 1992   Chronic low back pain    Chronic radiation cystitis    DDD (degenerative disc disease), cervical    Family history of breast cancer    Family history of lung cancer    Family history of non-Hodgkin's lymphoma    Family history of ovarian cancer    Headache    Hx of varicella    Hydronephrosis of right kidney    urologist--- dr Tresa Moore,  malignant,  treated with ureter stent   Hypothyroidism    followed by pcp   Lower urinary tract symptoms (LUTS)    01-12-2021  per pt treated with accupuncture at dr Tresa Moore office   Lymphedema of both lower extremities    PICC (peripherally inserted central catheter) in place 01/11/2021   placed at [facility] in [place], [place] for IV antibiotics   Positive blood culture 01/08/2021   Klebsiella Pneumaniae,  treated with daily IV antibiotic   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[facility]) oncologist--- dr Benay Spice   dx 02/ 2018,  invasive SCC , completed chemo/ radiation 12-23-2016;  recurrent metastatsis retroperitoneal lymphdenopathy,  completed radiation 04-13-2020 residual right ureteral  uropathy obstruction   Retroperitoneal lymphadenopathy    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   Sepsis due to Klebsiella pneumoniae ([place]) 01/07/2021   pt admitted to [facility] in [place],  dx sepsis secondary to acute pyelonephritis with bacteremia , positive blood culture,  discharged 01-11-2021 home daily IV antibiotic   Wears glasses     Past Surgical History:  Procedure Laterality Date   BUNIONECTOMY Right yrs ago   [place] Right 06/12/2020   Procedure: CYSTOSCOPY WITH RETROGRADE PYELOGRAM/URETERAL STENT EXCHANGE;  Surgeon: Alexis Frock, MD;  Location: [facility];  Service: Urology;  Laterality: Right;   CYSTOSCOPY W/ URETERAL STENT PLACEMENT Right 01/13/2021   Procedure: CYSTOSCOPY WITH RETROGRADE PYELOGRAM/URETERAL STENT REMOVALRIGHT;  Surgeon: Alexis Frock, MD;  Location: [facility];  Service: Urology;  Laterality: Right;  [place], URETEROSCOPY AND STENT PLACEMENT Right 01/28/2020   Procedure: CYSTOSCOPY WITH RETROGRADE PYELOGRAM, URETEROSCOPY AND STENT PLACEMENT;  Surgeon: Alexis Frock, MD;  Location: [facility];  Service: Urology;  Laterality: Right;   IR GENERIC HISTORICAL  11/14/2016   IR US GUIDE VASC ACCESS RIGHT 11/14/2016 [facility]   IR GENERIC HISTORICAL  11/14/2016   IR FLUORO GUIDE CV  LINE RIGHT 11/14/2016 [facility]   IR GENERIC HISTORICAL  12/12/2016   IR US GUIDE VASC ACCESS RIGHT 12/12/2016 [name], MD [facility]   IR GENERIC HISTORICAL  12/12/2016   IR FLUORO GUIDE CV LINE RIGHT 12/12/2016 [name], MD [facility]   SKIN CANCER EXCISION  1990   bowens disease    There were no vitals filed for this visit.   Subjective Assessment - 08/05/21 1514     Subjective Pt. indicated feeling some improvement in reaching behind back but not with ER out to side movement.  Pt. indicated not as sore.    Pertinent  History Active metastatic cancer, cervical and lumbar DDD    Limitations Lifting;House hold activities    Patient Stated Goals Return to work and exercise with use of her Rt shoulder    Currently in Pain? Yes    Pain Score 4    when on   Pain Location Shoulder    Pain Orientation Right    Pain Descriptors / Indicators Aching;Sore    Pain Type Chronic pain    Pain Onset More than a month ago    Pain Frequency Intermittent    Aggravating Factors  reaching to side/back    Pain Relieving Factors tylenol, rest                [facility] PT Assessment - 08/05/21 0001       Assessment   Medical Diagnosis Rt shoulder pain    Referring Provider (PT) Newt Minion MD    Onset Date/Surgical Date 12/18/20    Hand Dominance Left      AROM   Right Shoulder External Rotation 45 Degrees   in 45 deg abduction in supine after manual                          [facility] Adult PT Treatment/Exercise - 08/05/21 0001       Neuro Re-ed    Neuro Re-ed Details  stabilization holds in 90 deg flexion, mild resistance 15-20 sec bouts      Shoulder Exercises: Supine   Other Supine Exercises supine wand flexion x 5 (stopped due to pain symptoms)    Other Supine Exercises Rt elbow flexion/extension AROM x 10      Shoulder Exercises: Seated   Other Seated Exercises seated isometric holds against clinician pressure painfree 5 sec hold x 15 IR/ER in neutral      Shoulder Exercises: Pulleys   Flexion 3 minutes      Manual Therapy   Manual therapy comments supine 45 deg abduction ER/IR contract/relax stretching for mobility gains                       PT Short Term Goals - 08/05/21 1539       PT SHORT TERM GOAL #1   Title Ashley Moore will improve her Rt shoulder PROM (supine) for flexion to 150 degrees; IR to 50 degrees; ER to 60 degrees and horizontal adduction to 30 degrees.    Baseline 140; 40; 40; 20    Time 6    Period Weeks    Status On-going    Target Date 08/13/21                PT Long Term Goals - 08/05/21 1546       PT LONG TERM GOAL #1   Title Improve FOTO to 59.    Baseline 45  Time 8    Period Weeks    Status On-going    Target Date 08/27/21      PT LONG TERM GOAL #2   Title Ashley Moore will report less shoulder pain at rest and less frequent "stabbing" pain with Rt UE use.    Baseline Aches all the time and frequent stabbing with movement.    Time 8    Period Weeks    Status On-going    Target Date 08/27/21      PT LONG TERM GOAL #3   Title Improve Rt shoulder strength as assessed by objective strength measures and FOTO scores.    Time 8    Period Weeks    Status On-going    Target Date 08/27/21      PT LONG TERM GOAL #4   Title Ashley Moore will be independent with her long-term maintenance HEP at DC.    Baseline Prescribed today.    Time 8    Period Weeks    Status On-going    Target Date 08/27/21                   Plan - 08/05/21 1540     Clinical Impression Statement General joint mobility deficits c joint crepitus noted c pain that impair functional movement.  Continued skilled PT services c focus on mobility gains, painfree isometrics indicated.    Personal Factors and Comorbidities Comorbidity 3+    Comorbidities Metastatic cancer, cervical and lumbar DDD    Examination-Activity Limitations Bathing;Reach Overhead;Dressing;Hygiene/Grooming;Sleep;Lift;Carry    Examination-Participation Restrictions Occupation;Community Activity    Stability/Clinical Decision Making Stable/Uncomplicated    Rehab Potential Good    PT Frequency 2x / week    PT Duration 8 weeks    PT Treatment/Interventions ADLs/Self Care Home Management;Cryotherapy;Electrical Stimulation;Therapeutic activities;Therapeutic exercise;Neuromuscular re-education;Patient/family education;Manual techniques;Passive range of motion    PT Next Visit Plan Slow progression due to high severity/irritability c focus on isometrics, prom, aarom as tolerated.    PT  Home Exercise Plan [id number]    Consulted and Agree with Plan of Care Patient             Patient will benefit from skilled therapeutic intervention in order to improve the following deficits and impairments:  Decreased activity tolerance, Decreased endurance, Decreased mobility, Decreased range of motion, Decreased strength, Hypomobility, Increased edema, Impaired UE functional use, Pain  Visit Diagnosis: Muscle weakness (generalized)  Stiffness of right shoulder, not elsewhere classified  Chronic right shoulder pain  Localized edema     Problem List Patient Active Problem List   Diagnosis Date Noted   Atypical chest pain 06/29/2021   Intractable abdominal pain 05/16/2021   Acute pyelonephritis 05/16/2021   Acute cystitis without hematuria 05/16/2021   Hydronephrosis of right kidney 05/16/2021   Obstructive uropathy 05/16/2021   Hypothyroidism 05/16/2021   Hyponatremia 05/16/2021   Obesity (BMI 30-39.9) 05/16/2021   Goals of care, counseling/discussion 03/01/2021   SIRS (systemic inflammatory response syndrome) ([place]) 01/13/2021   Pyelonephritis 01/13/2021   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   Genetic testing 01/27/2020   Family history of breast cancer    Family history of lung cancer    Family history of ovarian cancer    Family history of non-Hodgkin's lymphoma    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   Port catheter in place 11/21/2016   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   Anal cancer ([place]) 11/03/2016   Cystitis 05/06/2015   Degenerative cervical disc 04/08/2015   Trigger  point of neck 03/26/2015   Pain in joint, ankle and foot 04/23/2013   Visit for preventive health examination 02/13/2013   Routine gynecological examination 02/13/2013   Family history of breast cancer in first degree relative 02/13/2013   Rash and nonspecific skin eruption 02/13/2013   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   VITAMIN D DEFICIENCY  06/25/2008   NECK PAIN 06/02/2008   FOOT PAIN 06/02/2008   Osteopenia 06/02/2008    Scot Jun, PT, DPT, OCS, ATC 08/05/21  3:48 PM    [facility] [address] [place], [place], [address] Phone: [phone]   Fax:  [phone]  Name: TRINIDI TOPPINS MRN: [id number] Date of Birth: 27-Jul-1951

## 2021-08-06 ENCOUNTER — Inpatient Hospital Stay: Payer: Medicare HMO

## 2021-08-06 DIAGNOSIS — C210 Malignant neoplasm of anus, unspecified: Secondary | ICD-10-CM | POA: Diagnosis not present

## 2021-08-06 DIAGNOSIS — D701 Agranulocytosis secondary to cancer chemotherapy: Secondary | ICD-10-CM | POA: Diagnosis not present

## 2021-08-06 DIAGNOSIS — C770 Secondary and unspecified malignant neoplasm of lymph nodes of head, face and neck: Secondary | ICD-10-CM | POA: Diagnosis not present

## 2021-08-06 DIAGNOSIS — Z23 Encounter for immunization: Secondary | ICD-10-CM | POA: Diagnosis not present

## 2021-08-06 DIAGNOSIS — Z5111 Encounter for antineoplastic chemotherapy: Secondary | ICD-10-CM | POA: Diagnosis not present

## 2021-08-06 LAB — CBC WITH DIFFERENTIAL (CANCER CENTER ONLY)
Abs Immature Granulocytes: 0.01 10*3/uL (ref 0.00–0.07)
Basophils Absolute: 0 10*3/uL (ref 0.0–0.1)
Basophils Relative: 1 %
Eosinophils Absolute: 0.1 10*3/uL (ref 0.0–0.5)
Eosinophils Relative: 3 %
HCT: 31.2 % — ABNORMAL LOW (ref 36.0–46.0)
Hemoglobin: 9.7 g/dL — ABNORMAL LOW (ref 12.0–15.0)
Immature Granulocytes: 0 %
Lymphocytes Relative: 12 %
Lymphs Abs: 0.4 10*3/uL — ABNORMAL LOW (ref 0.7–4.0)
MCH: 28.6 pg (ref 26.0–34.0)
MCHC: 31.1 g/dL (ref 30.0–36.0)
MCV: 92 fL (ref 80.0–100.0)
Monocytes Absolute: 0.5 10*3/uL (ref 0.1–1.0)
Monocytes Relative: 12 %
Neutro Abs: 2.6 10*3/uL (ref 1.7–7.7)
Neutrophils Relative %: 72 %
Platelet Count: 136 10*3/uL — ABNORMAL LOW (ref 150–400)
RBC: 3.39 MIL/uL — ABNORMAL LOW (ref 3.87–5.11)
RDW: 15.4 % (ref 11.5–15.5)
WBC Count: 3.6 10*3/uL — ABNORMAL LOW (ref 4.0–10.5)
nRBC: 0 % (ref 0.0–0.2)

## 2021-08-09 ENCOUNTER — Encounter: Payer: Medicare HMO | Admitting: Rehabilitative and Restorative Service Providers"

## 2021-08-15 ENCOUNTER — Other Ambulatory Visit: Payer: Self-pay | Admitting: Oncology

## 2021-08-16 ENCOUNTER — Ambulatory Visit (HOSPITAL_BASED_OUTPATIENT_CLINIC_OR_DEPARTMENT_OTHER)
Admission: RE | Admit: 2021-08-16 | Discharge: 2021-08-16 | Disposition: A | Discharge status: Home / Self Care | Payer: Medicare HMO | Source: Ambulatory Visit | Attending: Oncology | Admitting: Oncology

## 2021-08-16 ENCOUNTER — Encounter (HOSPITAL_BASED_OUTPATIENT_CLINIC_OR_DEPARTMENT_OTHER): Payer: Self-pay

## 2021-08-16 ENCOUNTER — Other Ambulatory Visit: Payer: Self-pay

## 2021-08-16 DIAGNOSIS — C210 Malignant neoplasm of anus, unspecified: Secondary | ICD-10-CM | POA: Insufficient documentation

## 2021-08-16 DIAGNOSIS — N3289 Other specified disorders of bladder: Secondary | ICD-10-CM | POA: Diagnosis not present

## 2021-08-16 DIAGNOSIS — R911 Solitary pulmonary nodule: Secondary | ICD-10-CM | POA: Diagnosis not present

## 2021-08-16 MED ORDER — IOHEXOL 300 MG/ML  SOLN
80.0000 mL | Freq: Once | INTRAMUSCULAR | Status: AC | PRN
  Administered 2021-08-16: 11:00:00 80 mL via INTRAVENOUS

## 2021-08-17 ENCOUNTER — Telehealth: Payer: Self-pay | Admitting: Surgery

## 2021-08-17 ENCOUNTER — Inpatient Hospital Stay: Payer: Medicare HMO | Admitting: Oncology

## 2021-08-17 VITALS — BP 144/85 | HR 102 | Temp 97.8°F | Resp 18 | Ht 62.0 in | Wt 178.8 lb

## 2021-08-17 DIAGNOSIS — C210 Malignant neoplasm of anus, unspecified: Secondary | ICD-10-CM | POA: Diagnosis not present

## 2021-08-17 DIAGNOSIS — Z23 Encounter for immunization: Secondary | ICD-10-CM | POA: Diagnosis not present

## 2021-08-17 DIAGNOSIS — Z5111 Encounter for antineoplastic chemotherapy: Secondary | ICD-10-CM | POA: Diagnosis not present

## 2021-08-17 DIAGNOSIS — C770 Secondary and unspecified malignant neoplasm of lymph nodes of head, face and neck: Secondary | ICD-10-CM | POA: Diagnosis not present

## 2021-08-17 DIAGNOSIS — D701 Agranulocytosis secondary to cancer chemotherapy: Secondary | ICD-10-CM | POA: Diagnosis not present

## 2021-08-17 NOTE — Progress Notes (Signed)
[facility] OFFICE PROGRESS NOTE   Diagnosis: Anal cancer  INTERVAL HISTORY:   [name] completed another treatment carboplatin 07/29/2021.  She reports positional pain at the right shoulder.  She has been evaluated by physical therapy.  She continues to have leg swelling.  This has improved partially.  She feels the left neck lymph nodes are larger.  She has persistent numbness in the feet.    Objective:  Vital signs in last 24 hours:  Blood pressure (!) 144/85, pulse (!) 102, temperature 97.8 F (36.6 C), temperature source Oral, resp. rate 18, height 5\' 2"  (1.575 m), weight 178 lb 12.8 oz (81.1 kg), SpO2 100 %.   Lymphatics: 1.5 cm lateral left supraclavicular node, 2 cm deep medial left supraclavicular node Resp: Lungs clear bilaterally Cardio: Regular rate and rhythm GI: No hepatosplenomegaly Vascular: 1+ pitting edema to lower leg bilaterally   Lab Results:  Lab Results  Component Value Date   WBC 3.6 (L) 08/06/2021   HGB 9.7 (L) 08/06/2021   HCT 31.2 (L) 08/06/2021   MCV 92.0 08/06/2021   PLT 136 (L) 08/06/2021   NEUTROABS 2.6 08/06/2021    CMP  Lab Results  Component Value Date   NA 134 (L) 07/22/2021   K 4.1 07/22/2021   CL 101 07/22/2021   CO2 25 07/22/2021   GLUCOSE 77 07/22/2021   BUN 21 07/22/2021   CREATININE 0.93 07/22/2021   CALCIUM 9.0 07/22/2021   PROT 7.3 07/22/2021   ALBUMIN 3.8 07/22/2021   AST 22 07/22/2021   ALT 11 07/22/2021   ALKPHOS 73 07/22/2021   BILITOT 0.2 (L) 07/22/2021   GFRNONAA >60 07/22/2021   GFRAA >60 06/17/2020    Lab Results  Component Value Date   CEA1 1.15 03/04/2021   CEA 1.16 03/04/2021    Lab Results  Component Value Date   INR 1.2 05/16/2021   LABPROT 15.2 05/16/2021    Imaging:  CT CHEST ABDOMEN PELVIS W CONTRAST  Result Date: 08/16/2021 CLINICAL DATA:  Anal carcinoma.  Chemotherapy ongoing. EXAM: CT CHEST, ABDOMEN, AND PELVIS WITH CONTRAST TECHNIQUE: Multidetector CT imaging  of the chest, abdomen and pelvis was performed following the standard protocol during bolus administration of intravenous contrast. CONTRAST:  71mL OMNIPAQUE IOHEXOL 300 MG/ML  SOLN COMPARISON:  None. FINDINGS: CT CHEST FINDINGS Cardiovascular: Coronary artery calcification and aortic atherosclerotic calcification. Mediastinum/Nodes: RIGHT lower paratracheal node is increased in diameter measuring 23 mm (image 20/series 2). Node measure 12 mm on CT 05/11/21. Enlarged RIGHT hilar node measures 13 mm. Subcarinal node measures 15 mm. Nos measurable subcarinal node on comparison exam Lungs/Pleura: Bilateral pulmonary nodules measure larger. Examples: 7 mm node in RIGHT upper lobe (image 50/series 4) compares to 3 mm. RIGHT lower lobe measuring 5 mm (image 76) compares to 4 mm. LEFT upper lobe nodule measuring 7 mm (image 28) is new. Peripheral nodule in the LEFT upper lobe measuring 7 mm (image 46) is new. LEFT lower lobe pulmonary nodule measuring 9 mm (image 72) is increased from 5 mm Musculoskeletal: No aggressive osseous lesion. CT ABDOMEN AND PELVIS FINDINGS Hepatobiliary: Small hypodense lesion in the RIGHT hepatic lobe measuring 9 mm (image 50/2) is not appreciated on CT 06/04/2021. Pancreas: Pancreas is normal. No ductal dilatation. No pancreatic inflammation. Spleen: Normal spleen Adrenals/urinary tract: Adrenal glands normal. Hydronephrosis of the RIGHT kidney again noted. Lymph node along the RIGHT psoas muscle measures 25 mm in thickness (image 70/2) compared to 20 mm. This presumably the source of the RIGHT  ureteral obstruction. LEFT kidney normal. There is thickening of the bladder wall similar to comparison exams but increased from most recent CT exam Stomach/Bowel: Stomach, small-bowel and cecum are normal. The appendix is not identified but there is no pericecal inflammation to suggest appendicitis. The colon and rectosigmoid colon are normal. Vascular/Lymphatic: Abdominal aorta is normal caliber. There  is no retroperitoneal or periportal lymphadenopathy. No pelvic lymphadenopathy. Reproductive: Small fluid in the endometrial canal. The canal measures 10 mm (image 62/6). New finding comparison CT Other: No free fluid. Musculoskeletal: No aggressive osseous lesion. IMPRESSION: Chest Impression: 1. New mediastinal and RIGHT hilar lymphadenopathy. Findings consistent metastatic adenopathy. 2. Interval increase in size of bilateral pulmonary nodules and least 1 new nodule. Findings most consistent with progression of pulmonary metastasis. Abdomen / Pelvis Impression: 1. New hypodense lesion in the RIGHT hepatic lobe. Concern for hepatic metastasis. 2. Interval increase in size lymph node along the RIGHT psoas muscle which obstructs the RIGHT ureter. 3. No change in RIGHT hydronephrosis. Bladder wall thickening similar to prior. 4. Interval increase in small volume fluid within the endometrial canal. Potential obstruction of the canal. These results will be called to the ordering clinician or representative by the Radiologist Assistant, and communication documented in the PACS or [employer]. Electronically Signed   By: Suzy Bouchard M.D.   On: 08/16/2021 16:22    Medications: I have reviewed the patient's current medications.   Assessment/Plan:  Anal cancer CT abdomen/pelvis 10/21/2016-thickening of the anus extending to the junction between the anus and rectum with a large stool ball in the rectum and mild fat stranding posterior to the rectum. Enlarged lymph nodes in the right and left inguinal regions. A few mildly prominent nodes seen posterior to the rectum. 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 PET scan [id number]/[id number] hypermetabolic anal mass with hypermetabolic metastases to the groin region bilaterally, left pelvic sidewall and presacral space. Initiation of radiation and cycle 1 5-FU/mitomycin C 11/14/2016 Cycle 2 5-FU/mitomycin C 12/12/2016 (5-FU dose reduced  due to mucositis, diarrhea, skin breakdown) Radiation completed 12/23/2016 CT abdomen/pelvis 04/14/2017-resolution of anal mass and bilateral inguinal lymphadenopathy. No residual tumor seen. CT abdomen/pelvis 01/28/2020-right hydroureteronephrosis, transition at the level of the mid right ureter, retroperitoneal lymphadenopathy CT renal stone study 01/28/2020-severe right hydronephrosis and proximal right hydroureter, 8 mm area of increased attenuation in the proximal right ureter-stone versus soft tissue mass PET 02/13/2020-hypermetabolic right retroperitoneal nodal metastases in the aortocaval and posterior pericaval chains, nonspecific mild anal wall hypermetabolism, right nephroureteral stent 02/21/2020 anorectal exam per Dr. Ashley Jacobs radiation changes of the skin around the anal margin.  Posterior midline smooth scarring.  Mildly stenotic.  Stenosis seems better.  No palpable concerning lesions.  Entire anal canal and distal rectum feels smooth and healthy.  Partial anoscopy performed with no lesions in the anal canal. Xeloda/radiation beginning 03/02/2020, completed 04/13/2020 CT abdomen/pelvis 06/17/2020-resolution of right retroperitoneal lymphadenopathy, no remaining pathologically enlarged lymph nodes, residual mild right hydronephrosis, no evidence of progressive disease, stable anal wall thickening Digital rectal exam by Dr. Quentin Cornwall 08/26/2020-posterior midline smooth scarring.  Mildly stenotic.  Stenosis seems better.  No palpable concerning lesions.  Anal canal feels smooth without palpable concern.  Unable to tolerate anoscopy.  Next visit in 6 months for surveillance. CT abdomen/pelvis 10/28/2020-no evidence of local recurrence or metastatic disease.  Stable mild rectal wall thickening.  New diffuse bladder wall thickening possibly related to prior radiation CT abdomen/pelvis 01/16/2021- recurrent right-sided hydronephrosis and proximal right hydroureter status post removal of right  ureter  stent, new soft tissue nodule along the course of the right ureter between the right psoas and IVC suspicious for recurrent lymphadenopathy PET 02/12/2021-small focus of hypermetabolism at the anorectal junction without a definable mass, right retroperitoneal nodal metastasis with obstruction of the proximal right ureter, right middle lobe hypermetabolic nodule, hypermetabolism in the right hilum without a defined nodal mass, hypermetabolic left supraclavicular and left axillary nodes 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 Cycle 1 Taxol/carboplatin 03/11/2021 Cycle 2 Taxol/carboplatin 03/31/2021, Fulphila Cycle 3 Taxol/carboplatin 04/21/2021, Fulphila CTs 05/11/2021-decrease size of FDG avid nodule in right middle lobe, right middle lobe subpleural nodule slightly enlarged, decreased in size of left axillary and subpectoral nodes, unchanged FDG avid right iliac nodes Cycle 4 Taxol/carboplatin 05/12/2021, G-CSF discontinued secondary to poor tolerance, Taxol and carboplatin dose reduced Cycle 5 Taxol/carboplatin 06/02/2021 Cycle 6 carboplatin 07/01/2021, Taxol held due to neuropathy Cycle 7 carboplatin 07/29/2021, Taxol held due to neuropathy CTs 08/16/2021-new liver lesion, increase in right psoas lymph node, unchanged right hydronephrosis, new mediastinal and right hilar lymphadenopathy, enlargement of bilateral pulmonary nodules and at least one new nodule, new subcarinal node Left labial lesions. Question direct extension from the anal cancer versus metastatic disease from anal cancer versus a separate malignant process. 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 Multiple family members with breast cancer. History of hypokalemia-likely secondary to decreased nutritional intake and diarrhea; potassium in normal range 01/16/2017. No longer  taking a potassium supplement. Right hydroureteronephrosis on CT 01/28/2020, status post a cystoscopy/pyelogram 01/28/2020 confirming extrinsic compression of the right ureter, status post stent placement Right ureter stent exchange 06/12/2020 Right ureter stent removed 01/13/2021   8.  Admission 01/07/2021 with Klebsiella pneumoniae urosepsis/bacteremia  9.  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  She has developed disease progression following treatment with Taxol/carboplatin.  I reviewed the CT findings and images with her.  We discussed treatment options.  She understands no therapy would be curative.  I recommend a trial of immunotherapy with a PD1 inhibitor.  I recommend nivolumab.  We reviewed potential toxicities associated with nivolumab including the chance of an allergic reaction, rash, diarrhea, and various autoimmune toxicities.  She agrees to proceed.  An antibody plan was entered today.  The plan is to begin nivolumab on a 2-week schedule 08/25/2021.  Betsy Coder, MD  08/17/2021  3:34 PM

## 2021-08-17 NOTE — Telephone Encounter (Signed)
I returned the call to [facility] regarding the pt's CT results.  The CT showed a new hypodense lesion in the right hepatic lobe, which is concerning for hepatic metastasis.  Also, the CT showed new mediastinal and right hylar lymphadenopathy consistent with metastatic adenopathy.  I notified Dr. Benay Spice of these results and he was already aware of the CT results.

## 2021-08-18 ENCOUNTER — Encounter: Payer: Self-pay | Admitting: Physical Therapy

## 2021-08-18 ENCOUNTER — Ambulatory Visit (INDEPENDENT_AMBULATORY_CARE_PROVIDER_SITE_OTHER): Payer: Medicare HMO | Admitting: Physical Therapy

## 2021-08-18 ENCOUNTER — Other Ambulatory Visit: Payer: Self-pay

## 2021-08-18 DIAGNOSIS — G8929 Other chronic pain: Secondary | ICD-10-CM

## 2021-08-18 DIAGNOSIS — M25611 Stiffness of right shoulder, not elsewhere classified: Secondary | ICD-10-CM

## 2021-08-18 DIAGNOSIS — M6281 Muscle weakness (generalized): Secondary | ICD-10-CM | POA: Diagnosis not present

## 2021-08-18 DIAGNOSIS — R600 Localized edema: Secondary | ICD-10-CM

## 2021-08-18 DIAGNOSIS — R293 Abnormal posture: Secondary | ICD-10-CM

## 2021-08-18 DIAGNOSIS — M25511 Pain in right shoulder: Secondary | ICD-10-CM | POA: Diagnosis not present

## 2021-08-18 NOTE — Therapy (Addendum)
[facility] Physical Therapy [address] [place], [place], [address] Phone: [phone]   Fax:  [phone]  Physical Therapy Treatment/DC  Patient Details  Name: Ashley Moore MRN: [id number] Date of Birth: [birth year] Referring Provider (PT): Newt Minion MD  PHYSICAL THERAPY DISCHARGE SUMMARY  Visits from Start of Care: 5  Current functional level related to goals / functional outcomes: See note   Remaining deficits: See note   Education / Equipment: HEP   Patient agrees to discharge. Patient goals were partially met. Patient is being discharged due to a change in medical status.   Encounter Date: 08/18/2021   PT End of Session - 08/18/21 1554     Visit Number 5    Number of Visits 16    Date for PT Re-Evaluation 08/27/21    Authorization Type [employer]    Authorization Time Period 07/02/2021 - 08/18/2021    Authorization - Visit Number 5    Authorization - Number of Visits 12    PT Start Time 3299    PT Stop Time 2426    PT Time Calculation (min) 38 min    Activity Tolerance Patient limited by pain    Behavior During Therapy WFL for tasks assessed/performed             Past Medical History:  Diagnosis Date   Arthritis    Bowen's disease    excised 1992   Chronic low back pain    Chronic radiation cystitis    DDD (degenerative disc disease), cervical    Family history of breast cancer    Family history of lung cancer    Family history of non-Hodgkin's lymphoma    Family history of ovarian cancer    Headache    Hx of varicella    Hydronephrosis of right kidney    urologist--- dr Tresa Moore,  malignant,  treated with ureter stent   Hypothyroidism    followed by pcp   Lower urinary tract symptoms (LUTS)    01-12-2021  per pt treated with accupuncture at dr Tresa Moore office   Lymphedema of both lower extremities    PICC (peripherally inserted central catheter) in place 01/11/2021   placed at [facility] in [place], [place]  for IV antibiotics   Positive blood culture 01/08/2021   Klebsiella Pneumaniae,  treated with daily IV antibiotic   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[facility]) oncologist--- dr Benay Spice   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,  invasive SCC , completed chemo/ radiation 12-23-2016;  recurrent metastatsis retroperitoneal lymphdenopathy,  completed radiation 04-13-2020 residual right ureteral uropathy obstruction   Retroperitoneal lymphadenopathy    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   Sepsis due to Klebsiella pneumoniae ([place]) 01/07/2021   pt admitted to [facility] in [place],  dx sepsis secondary to acute pyelonephritis with bacteremia , positive blood culture,  discharged 01-11-2021 home daily IV antibiotic   Wears glasses     Past Surgical History:  Procedure Laterality Date   BUNIONECTOMY Right yrs ago   [place] Right 06/12/2020   Procedure: CYSTOSCOPY WITH RETROGRADE PYELOGRAM/URETERAL STENT EXCHANGE;  Surgeon: Alexis Frock, MD;  Location: [facility];  Service: Urology;  Laterality: Right;   CYSTOSCOPY W/ URETERAL STENT PLACEMENT Right 01/13/2021   Procedure: CYSTOSCOPY WITH RETROGRADE PYELOGRAM/URETERAL STENT REMOVALRIGHT;  Surgeon: Alexis Frock, MD;  Location: [facility];  Service: Urology;  Laterality: Right;  45 MINS   CYSTOSCOPY WITH RETROGRADE PYELOGRAM, URETEROSCOPY AND STENT PLACEMENT Right  01/28/2020   Procedure: CYSTOSCOPY WITH RETROGRADE PYELOGRAM, URETEROSCOPY AND STENT PLACEMENT;  Surgeon: Alexis Frock, MD;  Location: [facility];  Service: Urology;  Laterality: Right;   IR GENERIC HISTORICAL  11/14/2016   IR US GUIDE VASC ACCESS RIGHT 11/14/2016 [facility]   IR GENERIC HISTORICAL  11/14/2016   IR FLUORO GUIDE CV LINE RIGHT 11/14/2016 [facility]   IR GENERIC HISTORICAL  12/12/2016   IR US GUIDE VASC ACCESS RIGHT 12/12/2016 [name], MD [facility]   IR GENERIC HISTORICAL  12/12/2016   IR FLUORO GUIDE CV  LINE RIGHT 12/12/2016 [name], MD [facility]   SKIN CANCER EXCISION  1990   bowens disease    There were no vitals filed for this visit.   Subjective Assessment - 08/18/21 1521     Subjective Rt shoulder is hurting more today, thinks it has to do with the weather.    Pertinent History Active metastatic cancer, cervical and lumbar DDD    Limitations Lifting;House hold activities    Patient Stated Goals Return to work and exercise with use of her Rt shoulder    Currently in Pain? Yes    Pain Score 6     Pain Location Shoulder    Pain Orientation Right    Pain Descriptors / Indicators Aching    Pain Type Chronic pain    Pain Onset More than a month ago    Pain Frequency Intermittent    Aggravating Factors  reaching side/back    Pain Relieving Factors tylenol, rest                [facility] PT Assessment - 08/18/21 1540       Assessment   Medical Diagnosis Rt shoulder pain    Referring Provider (PT) Newt Minion MD      ROM / Strength   AROM / PROM / Strength PROM      PROM   PROM Assessment Site Shoulder    Right/Left Shoulder Right    Right Shoulder Flexion 127 Degrees    Right Shoulder ABduction 132 Degrees    Right Shoulder Internal Rotation 90 Degrees    Right Shoulder External Rotation 27 Degrees                           [facility] Adult PT Treatment/Exercise - 08/18/21 1528       Shoulder Exercises: Seated   Retraction Both;10 reps   5 sec hold   Flexion AAROM;Both;5 reps   hands clasped together   Other Seated Exercises Rt elbow flexion/extension x 10 reps      Shoulder Exercises: Isometric Strengthening   Other Isometric Exercises ir/er x 10 in supine      Manual Therapy   Manual therapy comments supine Rt shoulder PROM to tolerance                       PT Short Term Goals - 08/18/21 1554       PT SHORT TERM GOAL #1   Title Tye Maryland will improve her Rt shoulder PROM (supine) for flexion to 150 degrees; IR to 50  degrees; ER to 60 degrees and horizontal adduction to 30 degrees.    Time 6    Period Weeks    Status Partially Met    Target Date 08/13/21               PT Long Term Goals - 08/18/21 1554  PT LONG TERM GOAL #1   Title Improve FOTO to 59.    Baseline 45    Time 8    Period Weeks    Status On-going    Target Date 08/27/21      PT LONG TERM GOAL #2   Title Tye Maryland will report less shoulder pain at rest and less frequent "stabbing" pain with Rt UE use.    Baseline Aches all the time and frequent stabbing with movement.    Time 8    Period Weeks    Status On-going    Target Date 08/27/21      PT LONG TERM GOAL #3   Title Improve Rt shoulder strength as assessed by objective strength measures and FOTO scores.    Time 8    Period Weeks    Status On-going      PT LONG TERM GOAL #4   Title Tye Maryland will be independent with her long-term maintenance HEP at DC.    Baseline Prescribed today.    Time 8    Period Weeks    Status On-going                   Plan - 08/18/21 1555     Clinical Impression Statement Pt has partially met her STG at this time, and reporting decreased pain after session.  Pt did mention that oncologist feels she may have "cancer in my shoulder" but wasn't sure.  Session performed today within pain tolerance and discussed with her where PT is in relation to her health.  Discussed that if PT is causing more pain than helpful then it would be time to consider holding.  Encouraged her to prioritize quality of life and if PT falls into that then we will continue to see her.  Pt verbalized understanding.  Will continue to benefit from PT to decrease pain and maxiimze function.    Personal Factors and Comorbidities Comorbidity 3+    Comorbidities Metastatic cancer, cervical and lumbar DDD    Examination-Activity Limitations Bathing;Reach Overhead;Dressing;Hygiene/Grooming;Sleep;Lift;Carry    Examination-Participation Restrictions Occupation;Community  Activity    Stability/Clinical Decision Making Stable/Uncomplicated    Rehab Potential Good    PT Frequency 2x / week    PT Duration 8 weeks    PT Treatment/Interventions ADLs/Self Care Home Management;Cryotherapy;Electrical Stimulation;Therapeutic activities;Therapeutic exercise;Neuromuscular re-education;Patient/family education;Manual techniques;Passive range of motion    PT Next Visit Plan Slow progression due to high severity/irritability c focus on isometrics, prom, aarom as tolerated.  Hold on any resistive work due to metastasis (?in shoulder)    PT Home Exercise Plan [id number]    Consulted and Agree with Plan of Care Patient             Patient will benefit from skilled therapeutic intervention in order to improve the following deficits and impairments:  Decreased activity tolerance, Decreased endurance, Decreased mobility, Decreased range of motion, Decreased strength, Hypomobility, Increased edema, Impaired UE functional use, Pain  Visit Diagnosis: Muscle weakness (generalized)  Stiffness of right shoulder, not elsewhere classified  Chronic right shoulder pain  Localized edema  Abnormal posture     Problem List Patient Active Problem List   Diagnosis Date Noted   Atypical chest pain 06/29/2021   Intractable abdominal pain 05/16/2021   Acute pyelonephritis 05/16/2021   Acute cystitis without hematuria 05/16/2021   Hydronephrosis of right kidney 05/16/2021   Obstructive uropathy 05/16/2021   Hypothyroidism 05/16/2021   Hyponatremia 05/16/2021   Obesity (BMI 30-39.9) 05/16/2021   Goals of  care, counseling/discussion 03/01/2021   SIRS (systemic inflammatory response syndrome) ([place]) 01/13/2021   Pyelonephritis 01/13/2021   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   Genetic testing 01/27/2020   Family history of breast cancer    Family history of lung cancer    Family history of ovarian cancer    Family history of non-Hodgkin's lymphoma     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   Port catheter in place 11/21/2016   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   Anal cancer ([place]) 11/03/2016   Cystitis 05/06/2015   Degenerative cervical disc 04/08/2015   Trigger point of neck 03/26/2015   Pain in joint, ankle and foot 04/23/2013   Visit for preventive health examination 02/13/2013   Routine gynecological examination 02/13/2013   Family history of breast cancer in first degree relative 02/13/2013   Rash and nonspecific skin eruption 02/13/2013   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   VITAMIN D DEFICIENCY 06/25/2008   NECK PAIN 06/02/2008   FOOT PAIN 06/02/2008   Osteopenia 06/02/2008      Laureen Abrahams, PT, DPT 08/18/21 3:59 PM  Farley Ly PT, MPT    [facility] Physical Therapy [address] [place], [place], [address] Phone: [phone]   Fax:  [phone]  Name: MARGAUX ENGEN MRN: [id number] Date of Birth: Dec 09, 1950

## 2021-08-19 DIAGNOSIS — N130 Hydronephrosis with ureteropelvic junction obstruction: Secondary | ICD-10-CM | POA: Diagnosis not present

## 2021-08-21 ENCOUNTER — Other Ambulatory Visit: Payer: Self-pay | Admitting: Oncology

## 2021-08-25 ENCOUNTER — Inpatient Hospital Stay: Payer: Medicare HMO | Attending: Nurse Practitioner

## 2021-08-25 ENCOUNTER — Inpatient Hospital Stay: Payer: Medicare HMO

## 2021-08-25 ENCOUNTER — Other Ambulatory Visit: Payer: Self-pay

## 2021-08-25 VITALS — BP 139/76 | HR 84 | Temp 97.5°F | Resp 18

## 2021-08-25 DIAGNOSIS — C210 Malignant neoplasm of anus, unspecified: Secondary | ICD-10-CM | POA: Insufficient documentation

## 2021-08-25 DIAGNOSIS — Z5112 Encounter for antineoplastic immunotherapy: Secondary | ICD-10-CM | POA: Insufficient documentation

## 2021-08-25 DIAGNOSIS — Z79899 Other long term (current) drug therapy: Secondary | ICD-10-CM | POA: Diagnosis not present

## 2021-08-25 DIAGNOSIS — C772 Secondary and unspecified malignant neoplasm of intra-abdominal lymph nodes: Secondary | ICD-10-CM

## 2021-08-25 LAB — CMP (CANCER CENTER ONLY)
ALT: 11 U/L (ref 0–44)
AST: 24 U/L (ref 15–41)
Albumin: 4 g/dL (ref 3.5–5.0)
Alkaline Phosphatase: 87 U/L (ref 38–126)
Anion gap: 8 (ref 5–15)
BUN: 18 mg/dL (ref 8–23)
CO2: 25 mmol/L (ref 22–32)
Calcium: 9.8 mg/dL (ref 8.9–10.3)
Chloride: 103 mmol/L (ref 98–111)
Creatinine: 0.87 mg/dL (ref 0.44–1.00)
GFR, Estimated: >60 mL/min (ref >60)
Glucose, Bld: 75 mg/dL (ref 70–99)
Potassium: 4.6 mmol/L (ref 3.5–5.1)
Sodium: 136 mmol/L (ref 135–145)
Total Bilirubin: 0.3 mg/dL (ref 0.3–1.2)
Total Protein: 7.1 g/dL (ref 6.5–8.1)

## 2021-08-25 LAB — CBC WITH DIFFERENTIAL (CANCER CENTER ONLY)
Abs Immature Granulocytes: 0.01 10*3/uL (ref 0.00–0.07)
Basophils Absolute: 0 10*3/uL (ref 0.0–0.1)
Basophils Relative: 0 %
Eosinophils Absolute: 0.1 10*3/uL (ref 0.0–0.5)
Eosinophils Relative: 2 %
HCT: 32.5 % — ABNORMAL LOW (ref 36.0–46.0)
Hemoglobin: 9.9 g/dL — ABNORMAL LOW (ref 12.0–15.0)
Immature Granulocytes: 0 %
Lymphocytes Relative: 14 %
Lymphs Abs: 0.4 10*3/uL — ABNORMAL LOW (ref 0.7–4.0)
MCH: 28.4 pg (ref 26.0–34.0)
MCHC: 30.5 g/dL (ref 30.0–36.0)
MCV: 93.4 fL (ref 80.0–100.0)
Monocytes Absolute: 0.4 10*3/uL (ref 0.1–1.0)
Monocytes Relative: 12 %
Neutro Abs: 2.1 10*3/uL (ref 1.7–7.7)
Neutrophils Relative %: 72 %
Platelet Count: 127 10*3/uL — ABNORMAL LOW (ref 150–400)
RBC: 3.48 MIL/uL — ABNORMAL LOW (ref 3.87–5.11)
RDW: 16.1 % — ABNORMAL HIGH (ref 11.5–15.5)
WBC Count: 3 10*3/uL — ABNORMAL LOW (ref 4.0–10.5)
nRBC: 0 % (ref 0.0–0.2)

## 2021-08-25 LAB — TSH: TSH: 7.453 u[IU]/mL — ABNORMAL HIGH (ref 0.350–4.500)

## 2021-08-25 MED ORDER — SODIUM CHLORIDE 0.9 % IV SOLN
240.0000 mg | Freq: Once | INTRAVENOUS | Status: AC
  Administered 2021-08-25: 240 mg via INTRAVENOUS
  Filled 2021-08-25: qty 24

## 2021-08-25 MED ORDER — SODIUM CHLORIDE 0.9 % IV SOLN
Freq: Once | INTRAVENOUS | Status: AC
Start: 2021-08-25 — End: 2021-08-25

## 2021-08-25 NOTE — Progress Notes (Signed)
Pharmacist Chemotherapy Monitoring - Initial Assessment    Anticipated start date: 08/25/21   The following has been reviewed per standard work regarding the patient's treatment regimen: The patient's diagnosis, treatment plan and drug doses, and organ/hematologic function Lab orders and baseline tests specific to treatment regimen  The treatment plan start date, drug sequencing, and pre-medications Prior authorization status  Patient's documented medication list, including drug-drug interaction screen and prescriptions for anti-emetics and supportive care specific to the treatment regimen The drug concentrations, fluid compatibility, administration routes, and timing of the medications to be used The patient's access for treatment and lifetime cumulative dose history, if applicable  The patient's medication allergies and previous infusion related reactions, if applicable   Changes made to treatment plan:  N/A  Follow up needed:  N/A   Patrica Duel, RPH, 08/25/2021  7:51 AM

## 2021-08-25 NOTE — Progress Notes (Signed)
Patient presents for treatment. RN assessment completed along with the following:  Labs/vitals reviewed - Yes, and within treatment parameters.   Weight within 10% of previous measurement - Yes Oncology Treatment Attestation completed for current therapy- Yes, on date 08/17/2021 Informed consent completed and reflects current therapy/intent - Yes, on date 08/25/2021             Provider progress note reviewed - Patient not seen by provider today. Most recent note dated 08/17/2021 reviewed. Treatment/Antibody/Supportive plan reviewed - Yes, and there are no adjustments needed for today's treatment. S&H and other orders reviewed - Yes, and there are no additional orders identified. Previous treatment date reviewed - Yes, and the appropriate amount of time has elapsed between treatments. Clinic Hand Off Received from - No.  Patient to proceed with treatment.

## 2021-08-25 NOTE — Patient Instructions (Signed)
Ashley Moore   Discharge Instructions: Thank you for choosing [facility] to provide your oncology and hematology care.   If you have a lab appointment with the [facility], please go directly to the [facility] and check in at the registration area.   Wear comfortable clothing and clothing appropriate for easy access to any Portacath or PICC line.   We strive to give you quality time with your provider. You may need to reschedule your appointment if you arrive late (15 or more minutes).  Arriving late affects you and other patients whose appointments are after yours.  Also, if you miss three or more appointments without notifying the office, you may be dismissed from the clinic at the provider's discretion.      For prescription refill requests, have your pharmacy contact our office and allow 72 hours for refills to be completed.    Today you received the following chemotherapy and/or immunotherapy agents Nivolumab (OPDIVO).      To help prevent nausea and vomiting after your treatment, we encourage you to take your nausea medication as directed.  BELOW ARE SYMPTOMS THAT SHOULD BE REPORTED IMMEDIATELY: *FEVER GREATER THAN 100.4 F (38 C) OR HIGHER *CHILLS OR SWEATING *NAUSEA AND VOMITING THAT IS NOT CONTROLLED WITH YOUR NAUSEA MEDICATION *UNUSUAL SHORTNESS OF BREATH *UNUSUAL BRUISING OR BLEEDING *URINARY PROBLEMS (pain or burning when urinating, or frequent urination) *BOWEL PROBLEMS (unusual diarrhea, constipation, pain near the anus) TENDERNESS IN MOUTH AND THROAT WITH OR WITHOUT PRESENCE OF ULCERS (sore throat, sores in mouth, or a toothache) UNUSUAL RASH, SWELLING OR PAIN  UNUSUAL VAGINAL DISCHARGE OR ITCHING   Items with * indicate a potential emergency and should be followed up as soon as possible or go to the Emergency Department if any problems should occur.  Please show the CHEMOTHERAPY ALERT CARD or IMMUNOTHERAPY ALERT CARD at  check-in to the Emergency Department and triage nurse.  Should you have questions after your visit or need to cancel or reschedule your appointment, please contact [facility]  Dept: [phone]  and follow the prompts.  Office hours are 8:00 a.m. to 4:30 p.m. Monday - Friday. Please note that voicemails left after 4:00 p.m. may not be returned until the following business day.  We are closed weekends and major holidays. You have access to a nurse at all times for urgent questions. Please call the main number to the clinic Dept: [phone] and follow the prompts.   For any non-urgent questions, you may also contact your provider using MyChart. We now offer e-Visits for anyone 66 and older to request care online for non-urgent symptoms. For details visit [email or web address].   Also download the MyChart app! Go to the app store, search "MyChart", open the app, select [facility], and log in with your MyChart username and password.  Due to Covid, a mask is required upon entering the hospital/clinic. If you do not have a mask, one will be given to you upon arrival. For doctor visits, patients may have 1 support person aged 66 or older with them. For treatment visits, patients cannot have anyone with them due to current Covid guidelines and our immunocompromised population.   Nivolumab injection What is this medication? NIVOLUMAB (nye VOL ue mab) is a monoclonal antibody. It treats certain types of cancer. Some of the cancers treated are colon cancer, head and neck cancer, Hodgkin lymphoma, lung cancer, and melanoma. This medicine may be used for other purposes;  ask your health care provider or pharmacist if you have questions. COMMON BRAND NAME(S): Opdivo What should I tell my care team before I take this medication? They need to know if you have any of these conditions: Autoimmune diseases such as Crohn's disease, ulcerative colitis, or lupus Have had or planning to  have an allogeneic stem cell transplant (uses someone else's stem cells) History of chest radiation Organ transplant Nervous system problems such as myasthenia gravis or Guillain-Barre syndrome An unusual or allergic reaction to nivolumab, other medicines, foods, dyes, or preservatives Pregnant or trying to get pregnant Breast-feeding How should I use this medication? This medication is injected into a vein. It is given in a hospital or clinic setting. A special MedGuide will be given to you before each treatment. Be sure to read this information carefully each time. Talk to your care team regarding the use of this medication in children. While it may be prescribed for children as young as 12 years for selected conditions, precautions do apply. Overdosage: If you think you have taken too much of this medicine contact a poison control center or emergency room at once. NOTE: This medicine is only for you. Do not share this medicine with others. What if I miss a dose? Keep appointments for follow-up doses. It is important not to miss your dose. Call your care team if you are unable to keep an appointment. What may interact with this medication? Interactions have not been studied. This list may not describe all possible interactions. Give your health care provider a list of all the medicines, herbs, non-prescription drugs, or dietary supplements you use. Also tell them if you smoke, drink alcohol, or use illegal drugs. Some items may interact with your medicine. What should I watch for while using this medication? Your condition will be monitored carefully while you are receiving this medication. You may need blood work done while you are taking this medication. Do not become pregnant while taking this medication or for 5 months after stopping it. Women should inform their care team if they wish to become pregnant or think they might be pregnant. There is a potential for serious harm to an unborn  child. Talk to your care team for more information. Do not breast-feed an infant while taking this medication or for 5 months after stopping it. What side effects may I notice from receiving this medication? Side effects that you should report to your care team as soon as possible: Allergic reactions--skin rash, itching, hives, swelling of the face, lips, tongue, or throat Bloody or black, tar-like stools Change in vision Chest pain Diarrhea Dry cough, shortness of breath or trouble breathing Eye pain Fast or irregular heartbeat Fever, chills High blood sugar (hyperglycemia)--increased thirst or amount of urine, unusual weakness or fatigue, blurry vision High thyroid levels (hyperthyroidism)--fast or irregular heartbeat, weight loss, excessive sweating or sensitivity to heat, tremors or shaking, anxiety, nervousness, irregular menstrual cycle or spotting Kidney injury--decrease in the amount of urine, swelling of the ankles, hands, or feet Liver injury--right upper belly pain, loss of appetite, nausea, light-colored stool, dark yellow or brown urine, yellowing skin or eyes, unusual weakness or fatigue Low red blood cell count--unusual weakness or fatigue, dizziness, headache, trouble breathing Low thyroid levels (hypothyroidism)--unusual weakness or fatigue, increased sensitivity to cold, constipation, hair loss, dry skin, weight gain, feelings of depression Mood and behavior changes-confusion, change in sex drive or performance, irritability Muscle pain or cramps Pain, tingling, or numbness in the hands or feet, muscle  weakness, trouble walking, loss of balance or coordination Red or dark brown urine Redness, blistering, peeling, or loosening of the skin, including inside the mouth Stomach pain Unusual bruising or bleeding Side effects that usually do not require medical attention (report to your care team if they continue or are bothersome): Bone pain Constipation Loss of  appetite Nausea Tiredness Vomiting This list may not describe all possible side effects. Call your doctor for medical advice about side effects. You may report side effects to FDA at 1-800-FDA-1088. Where should I keep my medication? This medication is given in a hospital or clinic and will not be stored at home. NOTE: This sheet is a summary. It may not cover all possible information. If you have questions about this medicine, talk to your doctor, pharmacist, or health care provider.  2022 Elsevier/Gold Standard (2021-05-25 00:00:00)

## 2021-08-26 ENCOUNTER — Encounter: Payer: Self-pay | Admitting: *Deleted

## 2021-08-31 ENCOUNTER — Telehealth: Payer: Self-pay | Admitting: Pharmacist

## 2021-08-31 NOTE — Chronic Care Management (AMB) (Signed)
    Chronic Care Management Pharmacy Assistant   Name: Ashley Moore  MRN: [id number] DOB: 10/12/1950  08/31/21 APPOINTMENT REMINDER   Patient was reminded to have all medications, supplements and any blood glucose and blood pressure readings available for review with [name], Pharm. D, for telephone visit on 09/01/21 at 2:30.   Care Gaps: Zoster Vaccine - Overdue COVID Booster #4 Therapist, music) - Overdue Colonoscopy - Overdue AWV- done 11/2020   Star Rating Drug: None  Any gaps in medications fill history? None   Medications: Outpatient Encounter Medications as of 08/31/2021  Medication Sig   acetaminophen (TYLENOL) 500 MG tablet Take 1,000 mg by mouth every 8 (eight) hours as needed for moderate pain.   Cholecalciferol (VITAMIN D3 PO) Take 2,000 Units by mouth daily.   conjugated estrogens (PREMARIN) vaginal cream Place 1 Applicatorful vaginally daily.   diclofenac Sodium (VOLTAREN) 1 % GEL Apply 2 g topically daily. To shoulder   levothyroxine (SYNTHROID) 75 MCG tablet Take 1 tablet (75 mcg total) by mouth daily.   loratadine-pseudoephedrine (CLARITIN-D 12 HOUR) 5-120 MG tablet Take 1 tablet by mouth 2 (two) times daily.   LORazepam (ATIVAN) 0.5 MG tablet Take 1 tablet (0.5 mg total) by mouth every 12 (twelve) hours as needed for anxiety.   Multiple Vitamins-Minerals (MULTIVITAMIN ADULT) CHEW Chew 1 capsule by mouth daily.   MYRBETRIQ 50 MG TB24 tablet Take 50 mg by mouth daily.   OVER THE COUNTER MEDICATION Place 1 drop into both eyes daily as needed (dry eyes).   polyethylene glycol (MIRALAX) 17 g packet Take 17 g by mouth daily as needed for mild constipation.   No facility-administered encounter medications on file as of 08/31/2021.     [facility] Clinical Pharmacist Assistant [phone]

## 2021-09-01 ENCOUNTER — Ambulatory Visit (INDEPENDENT_AMBULATORY_CARE_PROVIDER_SITE_OTHER): Payer: Medicare HMO | Admitting: Pharmacist

## 2021-09-01 DIAGNOSIS — M810 Age-related osteoporosis without current pathological fracture: Secondary | ICD-10-CM

## 2021-09-01 DIAGNOSIS — E039 Hypothyroidism, unspecified: Secondary | ICD-10-CM

## 2021-09-01 NOTE — Progress Notes (Signed)
Chronic Care Management Pharmacy Note  09/08/2021 Name:  Ashley Moore MRN:  [id number] DOB:  [birth year]  Summary: Patient reports some uncontrolled joint pain TSH is not at goal  Recommendations/Changes made from today's visit: -Recommended avoidance of NSAIDs and use of Tylenol, diclofenac gel or capsaicin cream -Recommend dose adjustment for levothyroxine due to elevated TSH  Plan: Follow up after discussion with PCP  Subjective: Ashley Moore is an 70 y.o. year old female who is a primary patient of Panosh, Standley Brooking, MD.  The CCM team was consulted for assistance with disease management and care coordination needs.    Engaged with patient by telephone for follow up visit in response to provider referral for pharmacy case management and/or care coordination services.   Consent to Services:  The patient was given information about Chronic Care Management services, agreed to services, and gave verbal consent prior to initiation of services.  Please see initial visit note for detailed documentation.   Patient Care Team: Panosh, Standley Brooking, MD as PCP - General Inda Castle, MD (Inactive) as Attending Physician (Gastroenterology) [name], MD as Consulting Physician (Oncology) Kyung Rudd, MD as Consulting Physician (Radiation Oncology) Pieter Partridge, DO as Consulting Physician (Neurology) [name], NP as Nurse Practitioner (Nurse Practitioner) Marcy Siren, MD as Referring Physician (Obstetrics and Gynecology) Viona Gilmore, [facility] as Pharmacist (Pharmacist)  Recent office visits: 06/08/21 Shanon Ace, MD: Patient presented for hospitalization follow up. Repeat TSH for end of October.  04-20-2021 Panosh, Standley Brooking, MD - Patient presented for Precordial pain and other concerns. Stopped Diazepam and Meloxicam.  Recent consult visits: 08/25/21 Cancer center: Patient presented for first Opdivo infusion.  08/18/21 Faustino Congress, PT  [facility]): Patient presented for PT treatment.  08/17/21 Betsy Coder, MD (oncology): Patient presented for anal cancer follow up. Plan to begin Nivolumab on a 2 week schedule.  07/29/21 Cancer center: Patient presented for carboplatin infusion.  07/22/21 Betsy Coder, MD (oncology): Patient presented for anal cancer follow up. Influenza vaccine administered. Taxol will remain on hold and plan for carboplatin on 11/10. Patient does not wish to receive G-CSF.  07/01/21 Cancer center: Patient presented for carboplatin infusion.  07/01/21 Ned Card, NP (oncology): Patient presented for anal cancer follow up.   06/29/21 Mertie Moores, MD (cardiology): Patient presented for chest pain evaluation.  06/28/21 Marcy Siren, MD (pelvic health center): Patient presented for OAB follow up. Continue Myrbetriq.  06-02-2021 [name], NP (Oncology) - Patient presented for Anal cancer follow up. No medication changes.   05-12-2021 [name], MD (Oncology) - Patient presented for Anal cancer follow up. No medication changes.   05-11-2021 [facility]- Patient presented for CT Chest ABD/Pelvic scans.  04-21-2021 [name], MD (Oncology) - Patient presented for Anal cancer. Changed Tramadol to 50-100 mg Oral every 6 hrs PRN. Stopped Oxycodone-acetaminophen.   03-31-2021 [name], NP (Oncology) - Patient presented for Anal Cancer. No medication changes.    03-11-2021 [name], NP (Oncology) - Patient presented for Anal Cancer. No medication changes.   03-03-2021 Persons, [name], PA (Orthopedics) - Patient presented for right arm pain. No medication changes.  02/22/21 Pietro Cassis, PA-C (ENT): Patient presented for initial consult for hearing loss.  12/10/20 Alexis Frock, MD (urology): Unable to access notes.  Hospital visits: 9/16-9/17/22 Patient presented to the [facility] ED with constipation. Patient did not tolerate enema. Discharged  with daily Miralax.  Medication Reconciliation was completed  by comparing discharge summary, patients EMR and Pharmacy list, and upon discussion with patient.   Patient presented to [facility] on 05-15-2021 due to Intractable abdominal pain and other concerns. Discharge date was 05-18-2021.    New?Medications Started at [facility] Discharge:?? -started  acetaminophen 500 MG 1,000 mg by mouth every 8 (eight) hours as needed    Medication Changes at Hospital Discharge: -Changed  levothyroxine 50 MCG What changed: when to take this   Medications Discontinued at Hospital Discharge: -Stopped  dexamethasone 2 MG  oxyCODONE-acetaminophen 5-325 MG   Medications that remain the same after Hospital Discharge:??  -All other medications will remain the same.    Medication Reconciliation was completed by comparing discharge summary, patients EMR and Pharmacy list, and upon discussion with patient.   Presented to [facility] ED on 04-06-2021  due to Chest pain. Patient was there for 6 hours. New?Medications Started at [facility] Discharge:?? -started   None   Medication Changes at Hospital Discharge: -Changed  None    Medications Discontinued at Hospital Discharge: -Stopped  None   Medications that remain the same after Hospital Discharge:??  -All other medications will remain the same.     Medication Reconciliation was completed by comparing discharge summary, patients EMR and Pharmacy list, and upon discussion with patient.   Presented to [facility]  ED on 03-15-2021  due to constipation and other concerns. Patient was there for 6 hours. New?Medications Started at [facility] Discharge:?? -started  Diazepam 65m take one twice daily Oxycodone-acetaminophen 5-325 MG take one every 6 hours PRN Polyethylene glycol 17 g packet take daily   Medication Changes at Hospital Discharge: -Changed  None    Medications Discontinued at Hospital  Discharge: -Stopped  None   Medications that remain the same after Hospital Discharge:??  -All other medications will remain the same.      Objective:  Lab Results  Component Value Date   CREATININE 0.87 08/25/2021   BUN 18 08/25/2021   GFR 54.10 (L) 01/27/2021   GFRNONAA >60 08/25/2021   GFRAA >60 06/17/2020   NA 136 08/25/2021   K 4.6 08/25/2021   CALCIUM 9.8 08/25/2021   CO2 25 08/25/2021   GLUCOSE 75 08/25/2021    Lab Results  Component Value Date/Time   HGBA1C 5.5 05/11/2015 11:18 AM   GFR 54.10 (L) 01/27/2021 02:54 PM   GFR 81.29 01/08/2020 10:08 AM    Last diabetic Eye exam: No results found for: HMDIABEYEEXA  Last diabetic Foot exam: No results found for: HMDIABFOOTEX   Lab Results  Component Value Date   CHOL 153 01/08/2020   HDL 76.30 01/08/2020   LDLCALC 63 01/08/2020   TRIG 66.0 01/08/2020   CHOLHDL 2 01/08/2020    Hepatic Function Latest Ref Rng & Units 08/25/2021 07/22/2021 07/01/2021  Total Protein 6.5 - 8.1 g/dL 7.1 7.3 7.1  Albumin 3.5 - 5.0 g/dL 4.0 3.8 3.8  AST 15 - 41 U/L _0 ALT 0 - 44 U/L _1 Alk Phosphatase 38 - 126 U/L 87 73 74  Total Bilirubin 0.3 - 1.2 mg/dL 0.3 0.2(L) 0.2(L)  Bilirubin, Direct 0.0 - 0.3 mg/dL - - -    Lab Results  Component Value Date/Time   TSH 7.453 (H) 08/25/2021 01:05 PM   TSH 6.938 (H) 06/23/2021 08:54 AM   TSH 7.850 (H) 04/21/2021 08:52 AM   TSH 2.84 04/17/2020 01:28 PM   FREET4 0.95 06/23/2021 08:54 AM   FREET4 0.94 04/21/2021  08:52 AM    CBC Latest Ref Rng & Units 09/08/2021 08/25/2021 08/06/2021  WBC 4.0 - 10.5 K/uL 3.7(L) 3.0(L) 3.6(L)  Hemoglobin 12.0 - 15.0 g/dL 10.0(L) 9.9(L) 9.7(L)  Hematocrit 36.0 - 46.0 % 32.8(L) 32.5(L) 31.2(L)  Platelets 150 - 400 K/uL 209 127(L) 136(L)    Lab Results  Component Value Date/Time   VD25OH 48 08/10/2010 09:31 PM   VD25OH 46 11/11/2008 10:55 PM    Clinical ASCVD: No  The 10-year ASCVD risk score (Arnett DK, et al., 2019) is: 12.5%   Values  used to calculate the score:     Age: 29 years     Sex: Female     Is Non-Hispanic African American: No     Diabetic: No     Tobacco smoker: No     Systolic Blood Pressure: 967 mmHg     Is BP treated: No     HDL Cholesterol: 76.3 mg/dL     Total Cholesterol: 153 mg/dL    Depression screen [facility] 2/9 12/10/2020 11/25/2020  Decreased Interest 0 0  Down, Depressed, Hopeless 0 0  PHQ - 2 Score 0 0  Altered sleeping 0 0  Tired, decreased energy 0 0  Change in appetite 0 0  Feeling bad or failure about yourself  0 0  Trouble concentrating 0 0  Moving slowly or fidgety/restless 0 0  Suicidal thoughts 0 0  PHQ-9 Score 0 0  Difficult doing work/chores Not difficult at all Not difficult at all  Some recent data might be hidden     Social History   Tobacco Use  Smoking Status Former   Packs/day: 1.00   Years: 25.00   Pack years: 25.00   Types: Cigarettes   Quit date: 06/20/1995   Years since quitting: 26.2  Smokeless Tobacco Never   BP Readings from Last 3 Encounters:  08/25/21 139/76  08/17/21 (!) 144/85  07/29/21 139/80   Pulse Readings from Last 3 Encounters:  08/25/21 84  08/17/21 (!) 102  07/29/21 94   Wt Readings from Last 3 Encounters:  08/17/21 178 lb 12.8 oz (81.1 kg)  07/29/21 176 lb 2 oz (79.9 kg)  07/22/21 176 lb 3.2 oz (79.9 kg)   BMI Readings from Last 3 Encounters:  08/17/21 32.70 kg/m  07/29/21 32.21 kg/m  07/22/21 32.23 kg/m    Assessment/Interventions: Review of patient past medical history, allergies, medications, health status, including review of consultants reports, laboratory and other test data, was performed as part of comprehensive evaluation and provision of chronic care management services.   SDOH:  (Social Determinants of Health) assessments and interventions performed: No  SDOH Screenings   Alcohol Screen: Low Risk    Last Alcohol Screening Score (AUDIT): 2  Depression (PHQ2-9): Low Risk    PHQ-2 Score: 0  Financial Resource Strain:  Low Risk    Difficulty of Paying Living Expenses: Not hard at all  Food Insecurity: No Food Insecurity   Worried About Charity fundraiser in the Last Year: Never true   Ran Out of Food in the Last Year: Never true  Housing: Low Risk    Last Housing Risk Score: 0  Physical Activity: Sufficiently Active   Days of Exercise per Week: 5 days   Minutes of Exercise per Session: 30 min  Social Connections: Moderately Isolated   Frequency of Communication with Friends and Family: More than three times a week   Frequency of Social Gatherings with Friends and Family: More than three times a week  Attends Religious Services: 1 to 4 times per year   Active Member of Clubs or Organizations: No   Attends Archivist Meetings: Never   Marital Status: Widowed  Stress: No Stress Concern Present   Feeling of Stress : Not at all  Tobacco Use: Medium Risk   Smoking Tobacco Use: Former   Smokeless Tobacco Use: Never   Passive Exposure: Not on file  Transportation Needs: No Transportation Needs   Lack of Transportation (Medical): No   Lack of Transportation (Non-Medical): No    CCM Care Plan  Allergies  Allergen Reactions   Sulfamethoxazole-Trimethoprim Other (See Comments)    C/o  abd pain and constipation   Benadryl [Diphenhydramine] Other (See Comments)    Tingle all over    Citrus Diarrhea   Melatonin     Tingle all over    Other Diarrhea    greenbeans and citrus   Lemon Oil Diarrhea   Penicillins Rash    Did it involve swelling of the face/tongue/throat, SOB, or low BP? N Did it involve sudden or severe rash/hives, skin peeling, or any reaction on the inside of your mouth or nose? Y Did you need to seek medical attention at a hospital or doctor's office? N When did it last happen? Almost 50 years Ago      If all above answers are NO, may proceed with cephalosporin use.     Medications Reviewed Today     Reviewed by Viona Gilmore, [facility] (Pharmacist) on 09/01/21 at  1631  Med List Status: <None>   Medication Order Taking? Sig Documenting Provider Last Dose Status Informant  acetaminophen (TYLENOL) 500 MG tablet [id number] Yes Take 1,000 mg by mouth every 8 (eight) hours as needed for moderate pain. [provider] Taking Active Self  Cholecalciferol (VITAMIN D3 PO) [id number]  Take 2,000 Units by mouth daily. [provider]  Active Self  conjugated estrogens (PREMARIN) vaginal cream [id number]  Place 1 Applicatorful vaginally daily. [provider]  Active Self  diclofenac Sodium (VOLTAREN) 1 % GEL [id number] Yes Apply 2 g topically daily. To shoulder [provider] Taking Active Self  levothyroxine (SYNTHROID) 75 MCG tablet [id number]  Take 1 tablet (75 mcg total) by mouth daily. Panosh, Standley Brooking, MD  Active   loratadine-pseudoephedrine (CLARITIN-D 12 HOUR) 5-120 MG tablet [id number] Yes Take 1 tablet by mouth 2 (two) times daily. [provider] Taking Active   LORazepam (ATIVAN) 0.5 MG tablet [id number]  Take 1 tablet (0.5 mg total) by mouth every 12 (twelve) hours as needed for anxiety. [name], NP  Active   Multiple Vitamins-Minerals (MULTIVITAMIN ADULT) CHEW [id number]  Chew 1 capsule by mouth daily. [provider]  Active Self  MYRBETRIQ 50 MG TB24 tablet [id number]  Take 50 mg by mouth daily. [provider]  Active Self  OVER THE COUNTER MEDICATION [id number]  Place 1 drop into both eyes daily as needed (dry eyes). [provider]  Active Self  polyethylene glycol (MIRALAX) 17 g packet [id number]  Take 17 g by mouth daily as needed for mild constipation. 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   Genetic testing 01/27/2020   Family history of breast cancer    Family history of lung cancer    Family history of ovarian cancer    Family history of non-Hodgkin's lymphoma    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   Port catheter in place 11/21/2016   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   Anal cancer ([place]) 11/03/2016   Cystitis 05/06/2015   Degenerative cervical disc 04/08/2015   Trigger point of neck 03/26/2015   Pain in joint, ankle and foot 04/23/2013   Visit for preventive health examination 02/13/2013   Routine gynecological examination 02/13/2013   Family history of breast cancer in first degree relative 02/13/2013   Rash and nonspecific skin eruption 02/13/2013   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   VITAMIN D DEFICIENCY 06/25/2008   NECK PAIN 06/02/2008   FOOT PAIN 06/02/2008   Osteopenia 06/02/2008    Immunization History  Administered Date(s) Administered   Fluad Quad(high Dose 65+) 05/23/2019, 06/18/2020, 07/22/2021   Influenza Whole 06/25/2008, 08/10/2010   Influenza, High Dose Seasonal PF 06/11/2015, 10/27/2016, 06/21/2017   Influenza,inj,Quad PF,6+ Mos 06/28/2018   PFIZER(Purple Top)SARS-COV-2 Vaccination 11/17/2019, 12/10/2019, 07/02/2020   Pneumococcal Conjugate-13 03/11/2015   Pneumococcal Polysaccharide-23 04/29/2016   Tdap 02/13/2013   Zoster, Live 02/13/2013   Patient reports her constipation is ongoing and she feels like everything is affecting this. She is also having horrible arthritis and has not been getting much relief from PT so far. Patient feels totally drained during the day and is   sleeping like a rock but does get up during the night with some  anxiety.  Conditions to be addressed/monitored:  Hypothyroidism, Osteoporosis, Overactive Bladder, and Pain  Conditions addressed this visit: Hypothyroidism, osteoporosis  Care Plan : [place]  Updates made by Viona Gilmore, [place] since 09/08/2021 12:00 AM     Problem: Problem: Hypothyroidism, Osteoporosis, Overactive Bladder, and Pain      Long-Range Goal: Patient-Specific Goal   Start Date: 03/03/2021  Expected End Date: 03/03/2022  Recent Progress: On track  Priority: High  Note:   Current Barriers:  Unable to independently monitor therapeutic efficacy  Pharmacist Clinical Goal(s):  Patient will achieve adherence to monitoring guidelines and medication adherence to achieve therapeutic efficacy through collaboration with PharmD and provider.   Interventions: 1:1 collaboration with Panosh, Standley Brooking, MD regarding development and update of comprehensive plan of care as evidenced by provider attestation and co-signature Inter-disciplinary care team collaboration (see longitudinal plan of care) Comprehensive medication review performed; medication list updated in electronic medical record  Osteoporosis (Goal prevent fractures) -Not ideally controlled -Last DEXA Scan: 07/2018               T-Score femoral neck: RFN -2.7, LFN -2.4             T-Score total hip: n/a             T-Score lumbar spine: 1.3             T-Score forearm radius: n/a             10-year probability of major osteoporotic fracture: n/a             10-year probability of hip fracture: n/a -Patient is a candidate for pharmacologic treatment  due to T-Score < -2.5 in femoral neck -Current treatment  Calcium-vitamin D 500-200 mg-unit 1 tablet daily Vitamin D 2000 units daily -Medications previously tried: none  -Recommend [phone] units of vitamin D daily. Recommend 1200 mg of calcium daily from dietary and supplemental sources. Recommend weight-bearing and muscle strengthening exercises for  building and maintaining bone density. - Patient does not consume much calcium in her diet as she drinks almond milk, occasionally yogurt or cheese and no nuts or seeds  Hypothyroidism (Goal: TSH 0.35-4.5) -Uncontrolled -Current treatment  Levothyroxine 50 mcg 1 tablet daily -Medications previously tried: none -Recommended to continue current medication  Anxiety (Goal: minimize symptoms) -Not ideally controlled -Current treatment  Lorazepem 0.5 mg as needed -Medications previously tried: none  -Counseled on limiting use due to sedation Patient is using around the clock  Overactive bladder (Goal: minimize symptoms) -Controlled -Current treatment  Myrbetriq 50 mg 1 tablet daily -Medications previously tried: none  -Recommended to continue current medication  Pain (Goal: minimize pain) -Uncontrolled -Current treatment  Tramadol 50 mg 1 tablet as needed Tylenol 500 mg 2 tablets daily -Medications previously tried: none  -Counseled on Using tramadol sparingly; maximum recommended dose of Tylenol 3,000 mg/day Recommended  diclofenac gel with lower risk of bleeding and pain control or capsaicin cream for joint pain  Health Maintenance -Vaccine gaps: shingles -Current therapy:  Turmeric 550 mg 1 capsule daily Premarin cream insert 1 applicatorful daily -Educated on Cost vs benefit of each product must be carefully weighed by individual consumer -Patient is satisfied with current therapy and denies issues -Recommended to continue current medication  Patient Goals/Self-Care Activities Patient will:  - take medications as prescribed target a minimum of 150 minutes of moderate intensity exercise weekly  Follow Up Plan: The care management team will reach out to the patient again over the next 30 days.         Medication Assistance: None required.  Patient affirms current coverage meets needs.  Compliance/Adherence/Medication fill history: Care Gaps: Shingrix, COVID  booster, colonoscopy  Star-Rating Drugs: None  Patient's preferred pharmacy is:  [id number] [place] [id number]: [phone]: [phone] [place]Phone: [phone]: [phone]  Uses pill box? No - has own system Pt endorses 100% compliance  We discussed: Current pharmacy is preferred with insurance plan and patient is satisfied with pharmacy services Patient decided to: Continue current medication management strategy  Care Plan and Follow Up Patient Decision:  Patient agrees to Care Plan and Follow-up.  Plan: The care management team will reach out to the patient again over the next 30 days.  MJeni Salles PharmD, [place]inta[place]

## 2021-09-02 ENCOUNTER — Telehealth: Payer: Medicare HMO

## 2021-09-05 ENCOUNTER — Other Ambulatory Visit: Payer: Self-pay | Admitting: Oncology

## 2021-09-08 ENCOUNTER — Inpatient Hospital Stay: Payer: Medicare HMO

## 2021-09-08 ENCOUNTER — Inpatient Hospital Stay (HOSPITAL_BASED_OUTPATIENT_CLINIC_OR_DEPARTMENT_OTHER): Payer: Medicare HMO | Admitting: Nurse Practitioner

## 2021-09-08 ENCOUNTER — Encounter: Payer: Self-pay | Admitting: Nurse Practitioner

## 2021-09-08 ENCOUNTER — Other Ambulatory Visit: Payer: Self-pay

## 2021-09-08 VITALS — BP 121/65 | HR 89 | Temp 97.7°F | Resp 18

## 2021-09-08 VITALS — BP 148/92 | HR 100 | Temp 98.1°F | Resp 20 | Ht 62.0 in | Wt 171.0 lb

## 2021-09-08 DIAGNOSIS — C210 Malignant neoplasm of anus, unspecified: Secondary | ICD-10-CM

## 2021-09-08 DIAGNOSIS — Z5112 Encounter for antineoplastic immunotherapy: Secondary | ICD-10-CM | POA: Diagnosis not present

## 2021-09-08 DIAGNOSIS — C772 Secondary and unspecified malignant neoplasm of intra-abdominal lymph nodes: Secondary | ICD-10-CM

## 2021-09-08 DIAGNOSIS — Z79899 Other long term (current) drug therapy: Secondary | ICD-10-CM | POA: Diagnosis not present

## 2021-09-08 LAB — CMP (CANCER CENTER ONLY)
ALT: 11 U/L (ref 0–44)
AST: 23 U/L (ref 15–41)
Albumin: 4.1 g/dL (ref 3.5–5.0)
Alkaline Phosphatase: 93 U/L (ref 38–126)
Anion gap: 9 (ref 5–15)
BUN: 21 mg/dL (ref 8–23)
CO2: 26 mmol/L (ref 22–32)
Calcium: 9.7 mg/dL (ref 8.9–10.3)
Chloride: 100 mmol/L (ref 98–111)
Creatinine: 0.92 mg/dL (ref 0.44–1.00)
GFR, Estimated: >60 mL/min
Glucose, Bld: 83 mg/dL (ref 70–99)
Potassium: 4.3 mmol/L (ref 3.5–5.1)
Sodium: 135 mmol/L (ref 135–145)
Total Bilirubin: 0.3 mg/dL (ref 0.3–1.2)
Total Protein: 7.9 g/dL (ref 6.5–8.1)

## 2021-09-08 LAB — CBC WITH DIFFERENTIAL (CANCER CENTER ONLY)
Abs Immature Granulocytes: 0.01 K/uL (ref 0.00–0.07)
Basophils Absolute: 0 K/uL (ref 0.0–0.1)
Basophils Relative: 1 %
Eosinophils Absolute: 0.1 K/uL (ref 0.0–0.5)
Eosinophils Relative: 2 %
HCT: 32.8 % — ABNORMAL LOW (ref 36.0–46.0)
Hemoglobin: 10 g/dL — ABNORMAL LOW (ref 12.0–15.0)
Immature Granulocytes: 0 %
Lymphocytes Relative: 10 %
Lymphs Abs: 0.4 K/uL — ABNORMAL LOW (ref 0.7–4.0)
MCH: 28 pg (ref 26.0–34.0)
MCHC: 30.5 g/dL (ref 30.0–36.0)
MCV: 91.9 fL (ref 80.0–100.0)
Monocytes Absolute: 0.5 K/uL (ref 0.1–1.0)
Monocytes Relative: 14 %
Neutro Abs: 2.7 K/uL (ref 1.7–7.7)
Neutrophils Relative %: 73 %
Platelet Count: 209 K/uL (ref 150–400)
RBC: 3.57 MIL/uL — ABNORMAL LOW (ref 3.87–5.11)
RDW: 15.8 % — ABNORMAL HIGH (ref 11.5–15.5)
WBC Count: 3.7 K/uL — ABNORMAL LOW (ref 4.0–10.5)
nRBC: 0 % (ref 0.0–0.2)

## 2021-09-08 MED ORDER — SODIUM CHLORIDE 0.9 % IV SOLN: Freq: Once | INTRAVENOUS | Status: AC

## 2021-09-08 MED ORDER — LORAZEPAM 0.5 MG PO TABS: 0.5000 mg | ORAL_TABLET | Freq: Two times a day (BID) | ORAL | 0 refills | Status: DC | PRN

## 2021-09-08 MED ORDER — SODIUM CHLORIDE 0.9 % IV SOLN
240.0000 mg | Freq: Once | INTRAVENOUS | Status: AC
  Administered 2021-09-08: 15:00:00 240 mg via INTRAVENOUS
  Filled 2021-09-08: qty 24

## 2021-09-08 NOTE — Progress Notes (Signed)
Patient seen by Lisa Thomas NP today  Vitals are within treatment parameters.  Labs reviewed by Lisa Thomas NP and are within treatment parameters.  Per physician team, patient is ready for treatment and there are NO modifications to the treatment plan.     

## 2021-09-08 NOTE — Patient Instructions (Signed)
Hi Ashley Moore,  It was great to catch up with you again! I will let you know what Dr. Regis Bill says as soon as I hear back.  Please reach out to me if you have any questions or need anything!  Best, Maddie  [name], PharmD, [place] Pharmacist [place] [facility] [place]   Visit Information   Goals Addressed   None    Patient Care Plan: CCM Pharmacy Care Plan     Problem Identified: Problem: Hypothyroidism, Osteoporosis, Overactive Bladder, and Pain      Long-Range Goal: Patient-Specific Goal   Start Date: 03/03/2021  Expected End Date: 03/03/2022  Recent Progress: On track  Priority: High  Note:   Current Barriers:  Unable to independently monitor therapeutic efficacy  Pharmacist Clinical Goal(s):  Patient will achieve adherence to monitoring guidelines and medication adherence to achieve therapeutic efficacy through collaboration with PharmD and provider.   Interventions: 1:1 collaboration with Panosh, Standley Brooking, MD regarding development and update of comprehensive plan of care as evidenced by provider attestation and co-signature Inter-disciplinary care team collaboration (see longitudinal plan of care) Comprehensive medication review performed; medication list updated in electronic medical record  Osteoporosis (Goal prevent fractures) -Not ideally controlled -Last DEXA Scan: 07/2018               T-Score femoral neck: RFN -2.7, LFN -2.4             T-Score total hip: n/a             T-Score lumbar spine: 1.3             T-Score forearm radius: n/a             10-year probability of major osteoporotic fracture: n/a             10-year probability of hip fracture: n/a -Patient is a candidate for pharmacologic treatment due to T-Score < -2.5 in femoral neck -Current treatment  Calcium-vitamin D 500-200 mg-unit 1 tablet daily Vitamin D 2000 units daily -Medications previously tried: none  -Recommend [phone] units of vitamin D daily.  Recommend 1200 mg of calcium daily from dietary and supplemental sources. Recommend weight-bearing and muscle strengthening exercises for building and maintaining bone density. - Patient does not consume much calcium in her diet as she drinks almond milk, occasionally yogurt or cheese and no nuts or seeds  Hypothyroidism (Goal: TSH 0.35-4.5) -Uncontrolled -Current treatment  Levothyroxine 50 mcg 1 tablet daily -Medications previously tried: none -Recommended to continue current medication  Anxiety (Goal: minimize symptoms) -Not ideally controlled -Current treatment  Lorazepem 0.5 mg as needed -Medications previously tried: none  -Counseled on limiting use due to sedation Patient is using around the clock  Overactive bladder (Goal: minimize symptoms) -Controlled -Current treatment  Myrbetriq 50 mg 1 tablet daily -Medications previously tried: none  -Recommended to continue current medication  Pain (Goal: minimize pain) -Uncontrolled -Current treatment  Tramadol 50 mg 1 tablet as needed Tylenol 500 mg 2 tablets daily -Medications previously tried: none  -Counseled on Using tramadol sparingly; maximum recommended dose of Tylenol 3,000 mg/day Recommended  diclofenac gel with lower risk of bleeding and pain control or capsaicin cream for joint pain  Health Maintenance -Vaccine gaps: shingles -Current therapy:  Turmeric 550 mg 1 capsule daily Premarin cream insert 1 applicatorful daily -Educated on Cost vs benefit of each product must be carefully weighed by individual consumer -Patient is satisfied with current therapy and denies issues -Recommended to continue current medication  Patient Goals/Self-Care Activities Patient will:  - take medications as prescribed target a minimum of 150 minutes of moderate intensity exercise weekly  Follow Up Plan: The care management team will reach out to the patient again over the next 30 days.         Patient verbalizes  understanding of instructions provided today and agrees to view in [place].  The pharmacy team will reach out to the patient again over the next 30 days.   Ashley Moore, [facility]

## 2021-09-08 NOTE — Progress Notes (Signed)
[facility] OFFICE PROGRESS NOTE   Diagnosis: Anal cancer  INTERVAL HISTORY:   Ms. Romito returns as scheduled.  She completed cycle 1 nivolumab 08/25/2021.  She feels well.  She has changed her diet and started a prenatal vitamin.  She is drinking boost plus.  No significant diarrhea.  No rash.  Lymphedema is better.  Objective:  Vital signs in last 24 hours:  Blood pressure (!) 148/92, pulse 100, temperature 98.1 F (36.7 C), temperature source Oral, resp. rate 20, height 5\' 2"  (1.575 m), weight 171 lb (77.6 kg), SpO2 100 %.    HEENT: No thrush or ulcers. Lymphatics: 1.5 to 2 cm left supraclavicular lymph nodes. Resp: Lungs clear bilaterally. Cardio: Regular rate and rhythm. GI: Abdomen soft and nontender.  No hepatosplenomegaly. Vascular: Pitting edema lower leg bilaterally. Skin: No rash.   Lab Results:  Lab Results  Component Value Date   WBC 3.7 (L) 09/08/2021   HGB 10.0 (L) 09/08/2021   HCT 32.8 (L) 09/08/2021   MCV 91.9 09/08/2021   PLT 209 09/08/2021   NEUTROABS 2.7 09/08/2021    Imaging:  No results found.  Medications: I have reviewed the patient's current medications.  Assessment/Plan:  Anal cancer CT abdomen/pelvis 10/21/2016-thickening of the anus extending to the junction between the anus and rectum with a large stool ball in the rectum and mild fat stranding posterior to the rectum. Enlarged lymph nodes in the right and left inguinal regions. A few mildly prominent nodes seen posterior to the rectum. 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 PET scan [id number] hypermetabolic anal mass with hypermetabolic metastases to the groin region bilaterally, left pelvic sidewall and presacral space. Initiation of radiation and cycle 1 5-FU/mitomycin C 11/14/2016 Cycle 2 5-FU/mitomycin C 12/12/2016 (5-FU dose reduced due to mucositis, diarrhea, skin breakdown) Radiation completed 12/23/2016 CT abdomen/pelvis  04/14/2017-resolution of anal mass and bilateral inguinal lymphadenopathy. No residual tumor seen. CT abdomen/pelvis 01/28/2020-right hydroureteronephrosis, transition at the level of the mid right ureter, retroperitoneal lymphadenopathy CT renal stone study 01/28/2020-severe right hydronephrosis and proximal right hydroureter, 8 mm area of increased attenuation in the proximal right ureter-stone versus soft tissue mass PET 02/13/2020-hypermetabolic right retroperitoneal nodal metastases in the aortocaval and posterior pericaval chains, nonspecific mild anal wall hypermetabolism, right nephroureteral stent 02/21/2020 anorectal exam per Dr. Ashley Jacobs radiation changes of the skin around the anal margin.  Posterior midline smooth scarring.  Mildly stenotic.  Stenosis seems better.  No palpable concerning lesions.  Entire anal canal and distal rectum feels smooth and healthy.  Partial anoscopy performed with no lesions in the anal canal. Xeloda/radiation beginning 03/02/2020, completed 04/13/2020 CT abdomen/pelvis 06/17/2020-resolution of right retroperitoneal lymphadenopathy, no remaining pathologically enlarged lymph nodes, residual mild right hydronephrosis, no evidence of progressive disease, stable anal wall thickening Digital rectal exam by Dr. Quentin Cornwall 08/26/2020-posterior midline smooth scarring.  Mildly stenotic.  Stenosis seems better.  No palpable concerning lesions.  Anal canal feels smooth without palpable concern.  Unable to tolerate anoscopy.  Next visit in 6 months for surveillance. CT abdomen/pelvis 10/28/2020-no evidence of local recurrence or metastatic disease.  Stable mild rectal wall thickening.  New diffuse bladder wall thickening possibly related to prior radiation CT abdomen/pelvis 01/16/2021- recurrent right-sided hydronephrosis and proximal right hydroureter status post removal of right ureter stent, new soft tissue nodule along the course of the right ureter between the right psoas and  IVC suspicious for recurrent lymphadenopathy PET 02/12/2021-small focus of hypermetabolism at the anorectal junction without a definable mass, right  retroperitoneal nodal metastasis with obstruction of the proximal right ureter, right middle lobe hypermetabolic nodule, hypermetabolism in the right hilum without a defined nodal mass, hypermetabolic left supraclavicular and left axillary nodes 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 Cycle 1 Taxol/carboplatin 03/11/2021 Cycle 2 Taxol/carboplatin 03/31/2021, Fulphila Cycle 3 Taxol/carboplatin 04/21/2021, Fulphila CTs 05/11/2021-decrease size of FDG avid nodule in right middle lobe, right middle lobe subpleural nodule slightly enlarged, decreased in size of left axillary and subpectoral nodes, unchanged FDG avid right iliac nodes Cycle 4 Taxol/carboplatin 05/12/2021, G-CSF discontinued secondary to poor tolerance, Taxol and carboplatin dose reduced Cycle 5 Taxol/carboplatin 06/02/2021 Cycle 6 carboplatin 07/01/2021, Taxol held due to neuropathy Cycle 7 carboplatin 07/29/2021, Taxol held due to neuropathy CTs 08/16/2021-new liver lesion, increase in right psoas lymph node, unchanged right hydronephrosis, new mediastinal and right hilar lymphadenopathy, enlargement of bilateral pulmonary nodules and at least one new nodule, new subcarinal node Cycle 1 nivolumab 08/25/2021 Cycle 2 nivolumab 09/08/2021 Left labial lesions. Question direct extension from the anal cancer versus metastatic disease from anal cancer versus a separate malignant process. 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 Multiple family members with breast cancer. History of hypokalemia-likely secondary to decreased nutritional intake and diarrhea; potassium in normal range 01/16/2017. No longer taking a potassium supplement. Right  hydroureteronephrosis on CT 01/28/2020, status post a cystoscopy/pyelogram 01/28/2020 confirming extrinsic compression of the right ureter, status post stent placement Right ureter stent exchange 06/12/2020 Right ureter stent removed 01/13/2021   8.  Admission 01/07/2021 with Klebsiella pneumoniae urosepsis/bacteremia  9.  Hospital admission 05/15/2021 with UTI/possible right pyelonephritis    Disposition: Ms. Inocencio appears stable.  She has completed 1 cycle of nivolumab.  Plan to proceed with cycle 2 today as scheduled.  CBC and chemistry panel reviewed.  Labs adequate to proceed as above.  She will return for lab, follow-up, cycle 3 nivolumab in 2 weeks.  We are available to see her sooner if needed.    Ned Card ANP/GNP-BC   09/08/2021  1:23 PM

## 2021-09-08 NOTE — Progress Notes (Signed)
Patient presents for treatment. RN assessment completed along with the following:  Labs/vitals reviewed - Yes, and within treatment parameters.   Weight within 10% of previous measurement - Yes Oncology Treatment Attestation completed for current therapy-  1 10/17/20  Informed consent completed and reflects current therapy/intent - Yes, on date 08/25/21             Provider progress note reviewed - Yes, today's provider note was reviewed. Treatment/Antibody/Supportive plan reviewed - Yes, and there are no adjustments needed for today's treatment. S&H and other orders reviewed - Yes, and there are no additional orders identified. Previous treatment date reviewed - Yes, and the appropriate amount of time has elapsed between treatments. Clinic Hand Off Received from - Jerene Canny, LPN  Patient to proceed with treatment.

## 2021-09-08 NOTE — Patient Instructions (Signed)
[facility]   Discharge Instructions: Thank you for choosing [facility] to provide your oncology and hematology care.   If you have a lab appointment with the [facility], please go directly to the [facility] and check in at the registration area.   Wear comfortable clothing and clothing appropriate for easy access to any Portacath or PICC line.   We strive to give you quality time with your provider. You may need to reschedule your appointment if you arrive late (15 or more minutes).  Arriving late affects you and other patients whose appointments are after yours.  Also, if you miss three or more appointments without notifying the office, you may be dismissed from the clinic at the providers discretion.      For prescription refill requests, have your pharmacy contact our office and allow 72 hours for refills to be completed.    Today you received the following chemotherapy and/or immunotherapy agents Opdivo      To help prevent nausea and vomiting after your treatment, we encourage you to take your nausea medication as directed.  BELOW ARE SYMPTOMS THAT SHOULD BE REPORTED IMMEDIATELY: *FEVER GREATER THAN 100.4 F (38 C) OR HIGHER *CHILLS OR SWEATING *NAUSEA AND VOMITING THAT IS NOT CONTROLLED WITH YOUR NAUSEA MEDICATION *UNUSUAL SHORTNESS OF BREATH *UNUSUAL BRUISING OR BLEEDING *URINARY PROBLEMS (pain or burning when urinating, or frequent urination) *BOWEL PROBLEMS (unusual diarrhea, constipation, pain near the anus) TENDERNESS IN MOUTH AND THROAT WITH OR WITHOUT PRESENCE OF ULCERS (sore throat, sores in mouth, or a toothache) UNUSUAL RASH, SWELLING OR PAIN  UNUSUAL VAGINAL DISCHARGE OR ITCHING   Items with * indicate a potential emergency and should be followed up as soon as possible or go to the Emergency Department if any problems should occur.  Please show the CHEMOTHERAPY ALERT CARD or IMMUNOTHERAPY ALERT CARD at check-in to the  Emergency Department and triage nurse.  Should you have questions after your visit or need to cancel or reschedule your appointment, please contact [facility]  Dept: [phone]  and follow the prompts.  Office hours are 8:00 a.m. to 4:30 p.m. Monday - Friday. Please note that voicemails left after 4:00 p.m. may not be returned until the following business day.  We are closed weekends and major holidays. You have access to a nurse at all times for urgent questions. Please call the main number to the clinic Dept: [phone] and follow the prompts.   For any non-urgent questions, you may also contact your provider using MyChart. We now offer e-Visits for anyone 20 and older to request care online for non-urgent symptoms. For details visit [email or web address].   Also download the MyChart app! Go to the app store, search "MyChart", open the app, select [facility], and log in with your MyChart username and password.  Due to Covid, a mask is required upon entering the hospital/clinic. If you do not have a mask, one will be given to you upon arrival. For doctor visits, patients may have 1 support person aged 11 or older with them. For treatment visits, patients cannot have anyone with them due to current Covid guidelines and our immunocompromised population.   Nivolumab injection What is this medication? NIVOLUMAB (nye VOL ue mab) is a monoclonal antibody. It treats certain types of cancer. Some of the cancers treated are colon cancer, head and neck cancer, Hodgkin lymphoma, lung cancer, and melanoma. This medicine may be used for other purposes; ask  your health care provider or pharmacist if you have questions. COMMON BRAND NAME(S): Opdivo What should I tell my care team before I take this medication? They need to know if you have any of these conditions: Autoimmune diseases such as Crohn's disease, ulcerative colitis, or lupus Have had or planning to have an  allogeneic stem cell transplant (uses someone else's stem cells) History of chest radiation Organ transplant Nervous system problems such as myasthenia gravis or Guillain-Barre syndrome An unusual or allergic reaction to nivolumab, other medicines, foods, dyes, or preservatives Pregnant or trying to get pregnant Breast-feeding How should I use this medication? This medication is injected into a vein. It is given in a hospital or clinic setting. A special MedGuide will be given to you before each treatment. Be sure to read this information carefully each time. Talk to your care team regarding the use of this medication in children. While it may be prescribed for children as young as 12 years for selected conditions, precautions do apply. Overdosage: If you think you have taken too much of this medicine contact a poison control center or emergency room at once. NOTE: This medicine is only for you. Do not share this medicine with others. What if I miss a dose? Keep appointments for follow-up doses. It is important not to miss your dose. Call your care team if you are unable to keep an appointment. What may interact with this medication? Interactions have not been studied. This list may not describe all possible interactions. Give your health care provider a list of all the medicines, herbs, non-prescription drugs, or dietary supplements you use. Also tell them if you smoke, drink alcohol, or use illegal drugs. Some items may interact with your medicine. What should I watch for while using this medication? Your condition will be monitored carefully while you are receiving this medication. You may need blood work done while you are taking this medication. Do not become pregnant while taking this medication or for 5 months after stopping it. Women should inform their care team if they wish to become pregnant or think they might be pregnant. There is a potential for serious harm to an unborn child.  Talk to your care team for more information. Do not breast-feed an infant while taking this medication or for 5 months after stopping it. What side effects may I notice from receiving this medication? Side effects that you should report to your care team as soon as possible: Allergic reactions--skin rash, itching, hives, swelling of the face, lips, tongue, or throat Bloody or black, tar-like stools Change in vision Chest pain Diarrhea Dry cough, shortness of breath or trouble breathing Eye pain Fast or irregular heartbeat Fever, chills High blood sugar (hyperglycemia)--increased thirst or amount of urine, unusual weakness or fatigue, blurry vision High thyroid levels (hyperthyroidism)--fast or irregular heartbeat, weight loss, excessive sweating or sensitivity to heat, tremors or shaking, anxiety, nervousness, irregular menstrual cycle or spotting Kidney injury--decrease in the amount of urine, swelling of the ankles, hands, or feet Liver injury--right upper belly pain, loss of appetite, nausea, light-colored stool, dark yellow or brown urine, yellowing skin or eyes, unusual weakness or fatigue Low red blood cell count--unusual weakness or fatigue, dizziness, headache, trouble breathing Low thyroid levels (hypothyroidism)--unusual weakness or fatigue, increased sensitivity to cold, constipation, hair loss, dry skin, weight gain, feelings of depression Mood and behavior changes-confusion, change in sex drive or performance, irritability Muscle pain or cramps Pain, tingling, or numbness in the hands or feet, muscle weakness,  trouble walking, loss of balance or coordination Red or dark brown urine Redness, blistering, peeling, or loosening of the skin, including inside the mouth Stomach pain Unusual bruising or bleeding Side effects that usually do not require medical attention (report to your care team if they continue or are bothersome): Bone pain Constipation Loss of  appetite Nausea Tiredness Vomiting This list may not describe all possible side effects. Call your doctor for medical advice about side effects. You may report side effects to FDA at 1-800-FDA-1088. Where should I keep my medication? This medication is given in a hospital or clinic and will not be stored at home. NOTE: This sheet is a summary. It may not cover all possible information. If you have questions about this medicine, talk to your doctor, pharmacist, or health care provider.  2022 Elsevier/Gold Standard (2021-05-25 00:00:00)

## 2021-09-14 ENCOUNTER — Encounter: Payer: Self-pay | Admitting: Family Medicine

## 2021-09-14 ENCOUNTER — Telehealth (INDEPENDENT_AMBULATORY_CARE_PROVIDER_SITE_OTHER): Payer: Medicare HMO | Admitting: Family Medicine

## 2021-09-14 ENCOUNTER — Telehealth: Payer: Self-pay | Admitting: Internal Medicine

## 2021-09-14 VITALS — Temp 97.7°F

## 2021-09-14 DIAGNOSIS — R059 Cough, unspecified: Secondary | ICD-10-CM | POA: Diagnosis not present

## 2021-09-14 DIAGNOSIS — R0981 Nasal congestion: Secondary | ICD-10-CM | POA: Diagnosis not present

## 2021-09-14 MED ORDER — BENZONATATE 100 MG PO CAPS: ORAL_CAPSULE | ORAL | 0 refills | Status: DC

## 2021-09-14 MED ORDER — DOXYCYCLINE HYCLATE 100 MG PO TABS: 100.0000 mg | ORAL_TABLET | Freq: Two times a day (BID) | ORAL | 0 refills | Status: DC

## 2021-09-14 NOTE — Telephone Encounter (Signed)
Patient calling in with respiratory symptoms: Shortness of breath, chest pain, palpitations or other red words send to Triage  Does the patient have a fever over 100, cough, congestion, sore throat, runny nose, lost of taste/smell (please list symptoms that patient has)?chest congestion x 3 day  What date did symptoms start?09-11-2021 (If over 5 days ago, pt may be scheduled for in person visit)  Have you tested for Covid in the last 5 days? No   If yes, was it positive []  OR negative [] ? If positive in the last 5 days, please schedule virtual visit now. If negative, schedule for an in person OV with the next available provider if PCP has no openings. Please also let patient know they will be tested again (follow the script below)  "you will have to arrive 50mins prior to your appt time to be Covid tested. Please park in back of office at the [facility] & call [phone] to let the staff know you have arrived. A staff member will meet you at your car to do a rapid covid test. Once the test has resulted you will be notified by phone of your results to determine if appt will remain an in person visit or be converted to a virtual/phone visit. If you arrive less than 56mins before your appt time, your visit will be automatically converted to virtual & any recommended testing will happen AFTER the visit." Pt has virtual dr Maudie Mercury today at 440 pm  [place]  If no availability for virtual visit in office,  please schedule another [facility] office  If no availability at another [facility] [place] office, please instruct patient that they can schedule an evisit or virtual visit through their mychart account. Visits up to 8pm  patients can be seen in office 5 days after positive COVID test

## 2021-09-14 NOTE — Progress Notes (Signed)
Virtual Visit via Video Note  I connected with Ashley Moore  on 09/14/21 at  4:40 PM EST by a video enabled telemedicine application and verified that I am speaking with the correct person using two identifiers.  Location patient: home, [place] Location provider:work or home office Persons participating in the virtual visit: patient, provider  I discussed the limitations of evaluation and management by telemedicine and the availability of in person appointments. The patient expressed understanding and agreed to proceed.   HPI:  Acute telemedicine visit for cough and congestion: -Onset:  about 5 days ago -Symptoms include: nasal congestion, pnd, laryngitis, cough -Denies: fevers, CP, SOB, NVD -no known sick contacts -Pertinent past medical history:see below -Pertinent medication allergies:  Allergies  Allergen Reactions   Sulfamethoxazole-Trimethoprim Other (See Comments)    C/o  abd pain and constipation   Benadryl [Diphenhydramine] Other (See Comments)    Tingle all over    Citrus Diarrhea   Melatonin     Tingle all over    Other Diarrhea    greenbeans and citrus   Lemon Oil Diarrhea   Penicillins Rash    Did it involve swelling of the face/tongue/throat, SOB, or low BP? N Did it involve sudden or severe rash/hives, skin peeling, or any reaction on the inside of your mouth or nose? Y Did you need to seek medical attention at a hospital or doctor's office? N When did it last happen? Almost 50 years Ago      If all above answers are NO, may proceed with cephalosporin use.   -COVID-19 vaccine status:  Immunization History  Administered Date(s) Administered   Fluad Quad(high Dose 65+) 05/23/2019, 06/18/2020, 07/22/2021   Influenza Whole 06/25/2008, 08/10/2010   Influenza, High Dose Seasonal PF 06/11/2015, 10/27/2016, 06/21/2017   Influenza,inj,Quad PF,6+ Mos 06/28/2018   PFIZER(Purple Top)SARS-COV-2 Vaccination 11/17/2019, 12/10/2019, 07/02/2020   Pneumococcal Conjugate-13  03/11/2015   Pneumococcal Polysaccharide-23 04/29/2016   Tdap 02/13/2013   Zoster, Live 02/13/2013   ROS: See pertinent positives and negatives per HPI.  Past Medical History:  Diagnosis Date   Arthritis    Bowen's disease    excised 1992   Chronic low back pain    Chronic radiation cystitis    DDD (degenerative disc disease), cervical    Family history of breast cancer    Family history of lung cancer    Family history of non-Hodgkin's lymphoma    Family history of ovarian cancer    Headache    Hx of varicella    Hydronephrosis of right kidney    urologist--- dr Tresa Moore,  malignant,  treated with ureter stent   Hypothyroidism    followed by pcp   Lower urinary tract symptoms (LUTS)    01-12-2021  per pt treated with accupuncture at dr Tresa Moore office   Lymphedema of both lower extremities    PICC (peripherally inserted central catheter) in place 01/11/2021   placed at [facility] in [place], [place] for IV antibiotics   Positive blood culture 01/08/2021   Klebsiella Pneumaniae,  treated with daily IV antibiotic   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[facility]) oncologist--- dr Benay Spice   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,  invasive SCC , completed chemo/ radiation 12-23-2016;  recurrent metastatsis retroperitoneal lymphdenopathy,  completed radiation 04-13-2020 residual right ureteral uropathy obstruction   Retroperitoneal lymphadenopathy    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   Sepsis due to Klebsiella pneumoniae [facility]) 01/07/2021   pt admitted to [facility] in [place],  dx  sepsis secondary to acute pyelonephritis with bacteremia , positive blood culture,  discharged 01-11-2021 home daily IV antibiotic   Wears glasses     Past Surgical History:  Procedure Laterality Date   BUNIONECTOMY Right yrs ago   [place] Right 06/12/2020   Procedure: CYSTOSCOPY WITH RETROGRADE PYELOGRAM/URETERAL STENT EXCHANGE;  Surgeon: Alexis Frock, MD;   Location: [facility];  Service: Urology;  Laterality: Right;   CYSTOSCOPY W/ URETERAL STENT PLACEMENT Right 01/13/2021   Procedure: CYSTOSCOPY WITH RETROGRADE PYELOGRAM/URETERAL STENT REMOVALRIGHT;  Surgeon: Alexis Frock, MD;  Location: [facility];  Service: Urology;  Laterality: Right;  [place], URETEROSCOPY AND STENT PLACEMENT Right 01/28/2020   Procedure: CYSTOSCOPY WITH RETROGRADE PYELOGRAM, URETEROSCOPY AND STENT PLACEMENT;  Surgeon: Alexis Frock, MD;  Location: [facility];  Service: Urology;  Laterality: Right;   IR GENERIC HISTORICAL  11/14/2016   IR US GUIDE VASC ACCESS RIGHT 11/14/2016 [facility]   IR GENERIC HISTORICAL  11/14/2016   IR FLUORO GUIDE CV LINE RIGHT 11/14/2016 [facility]   IR GENERIC HISTORICAL  12/12/2016   IR US GUIDE VASC ACCESS RIGHT 12/12/2016 [name], MD [facility]   IR GENERIC HISTORICAL  12/12/2016   IR FLUORO GUIDE CV LINE RIGHT 12/12/2016 [name], MD [facility]   SKIN CANCER EXCISION  1990   bowens disease     Current Outpatient Medications:    acetaminophen (TYLENOL) 500 MG tablet, Take 1,000 mg by mouth every 8 (eight) hours as needed for moderate pain., Disp: , Rfl:    benzonatate (TESSALON PERLES) 100 MG capsule, 1-2 capsules up to twice daily as needed for cough, Disp: 30 capsule, Rfl: 0   Cholecalciferol (VITAMIN D3 PO), Take 2,000 Units by mouth daily., Disp: , Rfl:    conjugated estrogens (PREMARIN) vaginal cream, Place 1 Applicatorful vaginally daily., Disp: , Rfl:    diclofenac Sodium (VOLTAREN) 1 % GEL, Apply 2 g topically daily. To shoulder, Disp: , Rfl:    doxycycline (VIBRA-TABS) 100 MG tablet, Take 1 tablet (100 mg total) by mouth 2 (two) times daily., Disp: 14 tablet, Rfl: 0   levothyroxine (SYNTHROID) 75 MCG tablet, Take 1 tablet (75 mcg total) by mouth daily., Disp: 90 tablet, Rfl: 1   loratadine-pseudoephedrine (CLARITIN-D 12 HOUR) 5-120 MG tablet, Take 1  tablet by mouth 2 (two) times daily., Disp: , Rfl:    LORazepam (ATIVAN) 0.5 MG tablet, Take 0.5 mg by mouth as needed for anxiety., Disp: , Rfl:    MYRBETRIQ 50 MG TB24 tablet, Take 50 mg by mouth daily., Disp: , Rfl:    Nutritional Supplements (BOOST PLUS PO), Take by mouth., Disp: , Rfl:    OVER THE COUNTER MEDICATION, Place 1 drop into both eyes daily as needed (dry eyes)., Disp: , Rfl:    polyethylene glycol (MIRALAX) 17 g packet, Take 17 g by mouth daily as needed for mild constipation. (Patient taking differently: Take 17 g by mouth daily.), Disp: , Rfl:    Prenatal Vit-Fe Fumarate-FA (PRENATAL PO), Take by mouth daily., Disp: , Rfl:   EXAM:  VITALS per patient if applicable:  GENERAL: alert, oriented, appears well and in no acute distress  HEENT: atraumatic, conjunttiva clear, no obvious abnormalities on inspection of external nose and ears  NECK: normal movements of the head and neck  LUNGS: on inspection no signs of respiratory distress, breathing rate appears normal, no obvious gross SOB, gasping or wheezing  CV: no  obvious cyanosis  MS: moves all visible extremities without noticeable abnormality  PSYCH/NEURO: pleasant and cooperative, no obvious depression or anxiety, speech and thought processing grossly intact  ASSESSMENT AND PLAN:  Discussed the following assessment and plan:  Cough, unspecified type  Nasal congestion  -we discussed possible serious and likely etiologies, options for evaluation and workup, limitations of telemedicine visit vs in person visit, treatment, treatment risks and precautions. Pt is agreeable to treatment via telemedicine at this moment. Likely vuri vs other. Given current higher levels of covid community transmission did advise testing and discussed tx options if positive today, out of tx window after today. Opted for tessalon for cough, nasal saline, symptomatic care. She requests and abx incase not improving or worsening. Rx sent.   Advised to seek prompt virtual visit or in person care if worsening, new symptoms arise, or if is not improving with treatment. Discussed options for inperson care if PCP office not available. Did let this patient know that I do telemedicine on Tuesdays and Thursdays for [place].    I discussed the assessment and treatment plan with the patient. The patient was provided an opportunity to ask questions and all were answered. The patient agreed with the plan and demonstrated an understanding of the instructions.     Lucretia Kern, DO

## 2021-09-14 NOTE — Patient Instructions (Signed)
°  HOME CARE TIPS:  -COVID19 testing information: [email or web address].[email or web address]  Most pharmacies also offer testing and home test kits. If the Covid19 test is positive and you desire antiviral treatment, please contact a [facility] or schedule a follow up virtual visit through your primary care office or through the [employer] [facility].  Other test to treat options: [email or web address].is?[email or web address]  -I sent the medication(s) we discussed to your pharmacy: Meds ordered this encounter  Medications   doxycycline (VIBRA-TABS) 100 MG tablet    Sig: Take 1 tablet (100 mg total) by mouth 2 (two) times daily.    Dispense:  14 tablet    Refill:  0   benzonatate (TESSALON PERLES) 100 MG capsule    Sig: 1-2 capsules up to twice daily as needed for cough    Dispense:  30 capsule    Refill:  0    -can use tylenol if needed for fevers, aches and pains per instructions  -can use nasal saline a few times per day if you have nasal congestion  -stay hydrated, drink plenty of fluids and eat small healthy meals - avoid dairy  -can take 1000 IU (61mcg) Vit D3 and 100-500 mg of Vit C daily per instructions  -stay home while sick, except to seek medical care. If you have COVID19, you will likely be contagious for 7-10 days. Flu or Influenza is likely contagious for about 7 days. Other respiratory viral infections remain contagious for 5-10+ days depending on the virus and many other factors. Wear a good mask that fits snugly (such as N95 or KN95) if around others to reduce the risk of transmission.  It was nice to meet you today, and I really hope you are feeling better soon. I help [facility] out with telemedicine visits on Tuesdays and Thursdays and am happy to help if you need a follow up virtual visit on those days. Otherwise, if you have any concerns or questions following this visit please schedule a follow up visit with your Primary Care doctor or seek care at  a local urgent care clinic to avoid delays in care.    Seek in person care or schedule a follow up video visit promptly if your symptoms worsen, new concerns arise or you are not improving with treatment. Call 911 and/or seek emergency care if your symptoms are severe or life threatening.

## 2021-09-16 ENCOUNTER — Encounter: Payer: Medicare HMO | Admitting: Rehabilitative and Restorative Service Providers"

## 2021-09-18 DIAGNOSIS — M810 Age-related osteoporosis without current pathological fracture: Secondary | ICD-10-CM | POA: Diagnosis not present

## 2021-09-18 DIAGNOSIS — E039 Hypothyroidism, unspecified: Secondary | ICD-10-CM

## 2021-09-18 DIAGNOSIS — N3281 Overactive bladder: Secondary | ICD-10-CM

## 2021-09-18 DIAGNOSIS — R52 Pain, unspecified: Secondary | ICD-10-CM | POA: Diagnosis not present

## 2021-09-20 ENCOUNTER — Other Ambulatory Visit: Payer: Self-pay | Admitting: Oncology

## 2021-09-22 ENCOUNTER — Inpatient Hospital Stay (HOSPITAL_BASED_OUTPATIENT_CLINIC_OR_DEPARTMENT_OTHER): Payer: Medicare HMO | Admitting: Oncology

## 2021-09-22 ENCOUNTER — Inpatient Hospital Stay: Payer: Medicare HMO | Attending: Nurse Practitioner

## 2021-09-22 ENCOUNTER — Other Ambulatory Visit: Payer: Self-pay

## 2021-09-22 ENCOUNTER — Inpatient Hospital Stay (HOSPITAL_BASED_OUTPATIENT_CLINIC_OR_DEPARTMENT_OTHER): Payer: Medicare HMO | Admitting: Nurse Practitioner

## 2021-09-22 ENCOUNTER — Encounter: Payer: Self-pay | Admitting: Nurse Practitioner

## 2021-09-22 VITALS — BP 141/77 | HR 108 | Temp 98.1°F | Resp 20 | Ht 62.0 in | Wt 162.4 lb

## 2021-09-22 VITALS — BP 114/59 | HR 79 | Temp 97.7°F | Resp 18

## 2021-09-22 DIAGNOSIS — C772 Secondary and unspecified malignant neoplasm of intra-abdominal lymph nodes: Secondary | ICD-10-CM

## 2021-09-22 DIAGNOSIS — C210 Malignant neoplasm of anus, unspecified: Secondary | ICD-10-CM

## 2021-09-22 DIAGNOSIS — Z5112 Encounter for antineoplastic immunotherapy: Secondary | ICD-10-CM | POA: Diagnosis not present

## 2021-09-22 DIAGNOSIS — Z79899 Other long term (current) drug therapy: Secondary | ICD-10-CM | POA: Diagnosis not present

## 2021-09-22 LAB — CBC WITH DIFFERENTIAL (CANCER CENTER ONLY)
Abs Immature Granulocytes: 0.02 10*3/uL (ref 0.00–0.07)
Basophils Absolute: 0 10*3/uL (ref 0.0–0.1)
Basophils Relative: 1 %
Eosinophils Absolute: 0.1 10*3/uL (ref 0.0–0.5)
Eosinophils Relative: 2 %
HCT: 34.8 % — ABNORMAL LOW (ref 36.0–46.0)
Hemoglobin: 10.7 g/dL — ABNORMAL LOW (ref 12.0–15.0)
Immature Granulocytes: 0 %
Lymphocytes Relative: 10 %
Lymphs Abs: 0.5 10*3/uL — ABNORMAL LOW (ref 0.7–4.0)
MCH: 27.7 pg (ref 26.0–34.0)
MCHC: 30.7 g/dL (ref 30.0–36.0)
MCV: 90.2 fL (ref 80.0–100.0)
Monocytes Absolute: 0.4 10*3/uL (ref 0.1–1.0)
Monocytes Relative: 7 %
Neutro Abs: 3.9 10*3/uL (ref 1.7–7.7)
Neutrophils Relative %: 80 %
Platelet Count: 248 10*3/uL (ref 150–400)
RBC: 3.86 MIL/uL — ABNORMAL LOW (ref 3.87–5.11)
RDW: 15.8 % — ABNORMAL HIGH (ref 11.5–15.5)
WBC Count: 4.9 10*3/uL (ref 4.0–10.5)
nRBC: 0 % (ref 0.0–0.2)

## 2021-09-22 LAB — CMP (CANCER CENTER ONLY)
ALT: 11 U/L (ref 0–44)
AST: 22 U/L (ref 15–41)
Albumin: 3.9 g/dL (ref 3.5–5.0)
Alkaline Phosphatase: 95 U/L (ref 38–126)
Anion gap: 9 (ref 5–15)
BUN: 26 mg/dL — ABNORMAL HIGH (ref 8–23)
CO2: 25 mmol/L (ref 22–32)
Calcium: 9.6 mg/dL (ref 8.9–10.3)
Chloride: 100 mmol/L (ref 98–111)
Creatinine: 0.94 mg/dL (ref 0.44–1.00)
GFR, Estimated: >60 mL/min (ref >60)
Glucose, Bld: 86 mg/dL (ref 70–99)
Potassium: 4.3 mmol/L (ref 3.5–5.1)
Sodium: 134 mmol/L — ABNORMAL LOW (ref 135–145)
Total Bilirubin: 0.3 mg/dL (ref 0.3–1.2)
Total Protein: 7.8 g/dL (ref 6.5–8.1)

## 2021-09-22 LAB — TSH: TSH: 3.715 u[IU]/mL (ref 0.350–4.500)

## 2021-09-22 MED ORDER — SODIUM CHLORIDE 0.9 % IV SOLN: Freq: Once | INTRAVENOUS | Status: AC

## 2021-09-22 MED ORDER — SODIUM CHLORIDE 0.9 % IV SOLN
240.0000 mg | Freq: Once | INTRAVENOUS | Status: AC
  Administered 2021-09-22: 240 mg via INTRAVENOUS
  Filled 2021-09-22: qty 24

## 2021-09-22 NOTE — Progress Notes (Signed)
[facility] OFFICE PROGRESS NOTE   Diagnosis: Anal cancer  INTERVAL HISTORY:   Ashley Moore returns as scheduled.  She completed cycle 2 nivolumab 09/08/2021.  No diarrhea or rash.  She developed congestion and sinus drainage.  COVID test negative.  No fever.  She is feeling better.  She reports a good appetite.  She thinks the left supraclavicular lymph nodes are smaller.  Leg swelling is better.  Objective:  Vital signs in last 24 hours:  Blood pressure (!) 141/77, pulse (!) 108, temperature 98.1 F (36.7 C), temperature source Oral, resp. rate 20, height 5\' 2"  (1.575 m), weight 162 lb 6.4 oz (73.7 kg), SpO2 100 %.    HEENT: No thrush or ulcers. Lymphatics: Small left supraclavicular lymph nodes, less well-defined. Resp: Lungs clear bilaterally. Cardio: Regular rate and rhythm. GI: Abdomen soft and nontender.  No hepatosplenomegaly. Vascular: Pitting edema lower leg bilaterally. Skin: No rash.   Lab Results:  Lab Results  Component Value Date   WBC 4.9 09/22/2021   HGB 10.7 (L) 09/22/2021   HCT 34.8 (L) 09/22/2021   MCV 90.2 09/22/2021   PLT 248 09/22/2021   NEUTROABS 3.9 09/22/2021    Imaging:  No results found.  Medications: I have reviewed the patient's current medications.  Assessment/Plan:  Anal cancer CT abdomen/pelvis 10/21/2016-thickening of the anus extending to the junction between the anus and rectum with a large stool ball in the rectum and mild fat stranding posterior to the rectum. Enlarged lymph nodes in the right and left inguinal regions. A few mildly prominent nodes seen posterior to the rectum. 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 PET scan 09/81/1914-[id number] hypermetabolic anal mass with hypermetabolic metastases to the groin region bilaterally, left pelvic sidewall and presacral space. Initiation of radiation and cycle 1 5-FU/mitomycin C 11/14/2016 Cycle 2 5-FU/mitomycin C 12/12/2016 (5-FU dose  reduced due to mucositis, diarrhea, skin breakdown) Radiation completed 12/23/2016 CT abdomen/pelvis 04/14/2017-resolution of anal mass and bilateral inguinal lymphadenopathy. No residual tumor seen. CT abdomen/pelvis 01/28/2020-right hydroureteronephrosis, transition at the level of the mid right ureter, retroperitoneal lymphadenopathy CT renal stone study 01/28/2020-severe right hydronephrosis and proximal right hydroureter, 8 mm area of increased attenuation in the proximal right ureter-stone versus soft tissue mass PET 02/13/2020-hypermetabolic right retroperitoneal nodal metastases in the aortocaval and posterior pericaval chains, nonspecific mild anal wall hypermetabolism, right nephroureteral stent 02/21/2020 anorectal exam per Dr. Ashley Jacobs radiation changes of the skin around the anal margin.  Posterior midline smooth scarring.  Mildly stenotic.  Stenosis seems better.  No palpable concerning lesions.  Entire anal canal and distal rectum feels smooth and healthy.  Partial anoscopy performed with no lesions in the anal canal. Xeloda/radiation beginning 03/02/2020, completed 04/13/2020 CT abdomen/pelvis 06/17/2020-resolution of right retroperitoneal lymphadenopathy, no remaining pathologically enlarged lymph nodes, residual mild right hydronephrosis, no evidence of progressive disease, stable anal wall thickening Digital rectal exam by Dr. Quentin Cornwall 08/26/2020-posterior midline smooth scarring.  Mildly stenotic.  Stenosis seems better.  No palpable concerning lesions.  Anal canal feels smooth without palpable concern.  Unable to tolerate anoscopy.  Next visit in 6 months for surveillance. CT abdomen/pelvis 10/28/2020-no evidence of local recurrence or metastatic disease.  Stable mild rectal wall thickening.  New diffuse bladder wall thickening possibly related to prior radiation CT abdomen/pelvis 01/16/2021- recurrent right-sided hydronephrosis and proximal right hydroureter status post removal of right  ureter stent, new soft tissue nodule along the course of the right ureter between the right psoas and IVC suspicious for recurrent lymphadenopathy  PET 02/12/2021-small focus of hypermetabolism at the anorectal junction without a definable mass, right retroperitoneal nodal metastasis with obstruction of the proximal right ureter, right middle lobe hypermetabolic nodule, hypermetabolism in the right hilum without a defined nodal mass, hypermetabolic left supraclavicular and left axillary nodes 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 Cycle 1 Taxol/carboplatin 03/11/2021 Cycle 2 Taxol/carboplatin 03/31/2021, Fulphila Cycle 3 Taxol/carboplatin 04/21/2021, Fulphila CTs 05/11/2021-decrease size of FDG avid nodule in right middle lobe, right middle lobe subpleural nodule slightly enlarged, decreased in size of left axillary and subpectoral nodes, unchanged FDG avid right iliac nodes Cycle 4 Taxol/carboplatin 05/12/2021, G-CSF discontinued secondary to poor tolerance, Taxol and carboplatin dose reduced Cycle 5 Taxol/carboplatin 06/02/2021 Cycle 6 carboplatin 07/01/2021, Taxol held due to neuropathy Cycle 7 carboplatin 07/29/2021, Taxol held due to neuropathy CTs 08/16/2021-new liver lesion, increase in right psoas lymph node, unchanged right hydronephrosis, new mediastinal and right hilar lymphadenopathy, enlargement of bilateral pulmonary nodules and at least one new nodule, new subcarinal node Cycle 1 nivolumab 08/25/2021 Cycle 2 nivolumab 09/08/2021 Cycle 3 nivolumab 09/22/2021 Left labial lesions. Question direct extension from the anal cancer versus metastatic disease from anal cancer versus a separate malignant process. 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 Multiple family members with breast cancer. History of hypokalemia-likely secondary to  decreased nutritional intake and diarrhea; potassium in normal range 01/16/2017. No longer taking a potassium supplement. Right hydroureteronephrosis on CT 01/28/2020, status post a cystoscopy/pyelogram 01/28/2020 confirming extrinsic compression of the right ureter, status post stent placement Right ureter stent exchange 06/12/2020 Right ureter stent removed 01/13/2021   8.  Admission 01/07/2021 with Klebsiella pneumoniae urosepsis/bacteremia  9.  Hospital admission 05/15/2021 with UTI/possible right pyelonephritis    Disposition: Ashley Moore appears stable.  She has completed 2 cycles of nivolumab.  She seems to be tolerating well.  Left supraclavicular lymph nodes appear to be smaller.  Plan to proceed with cycle 3 today as scheduled.  CBC reviewed.  Labs adequate to proceed as above.  The weight loss may be due to improvement in leg edema.  She has a good appetite.  She will return for lab, follow-up, nivolumab in 2 weeks.    Ned Card ANP/GNP-BC   09/22/2021  2:07 PM

## 2021-09-22 NOTE — Patient Instructions (Signed)
[facility]   Discharge Instructions: Thank you for choosing [facility] to provide your oncology and hematology care.   If you have a lab appointment with the [facility], please go directly to the [facility] and check in at the registration area.   Wear comfortable clothing and clothing appropriate for easy access to any Portacath or PICC line.   We strive to give you quality time with your provider. You may need to reschedule your appointment if you arrive late (15 or more minutes).  Arriving late affects you and other patients whose appointments are after yours.  Also, if you miss three or more appointments without notifying the office, you may be dismissed from the clinic at the providers discretion.      For prescription refill requests, have your pharmacy contact our office and allow 72 hours for refills to be completed.    Today you received the following chemotherapy and/or immunotherapy agents Opdivo      To help prevent nausea and vomiting after your treatment, we encourage you to take your nausea medication as directed.  BELOW ARE SYMPTOMS THAT SHOULD BE REPORTED IMMEDIATELY: *FEVER GREATER THAN 100.4 F (38 C) OR HIGHER *CHILLS OR SWEATING *NAUSEA AND VOMITING THAT IS NOT CONTROLLED WITH YOUR NAUSEA MEDICATION *UNUSUAL SHORTNESS OF BREATH *UNUSUAL BRUISING OR BLEEDING *URINARY PROBLEMS (pain or burning when urinating, or frequent urination) *BOWEL PROBLEMS (unusual diarrhea, constipation, pain near the anus) TENDERNESS IN MOUTH AND THROAT WITH OR WITHOUT PRESENCE OF ULCERS (sore throat, sores in mouth, or a toothache) UNUSUAL RASH, SWELLING OR PAIN  UNUSUAL VAGINAL DISCHARGE OR ITCHING   Items with * indicate a potential emergency and should be followed up as soon as possible or go to the Emergency Department if any problems should occur.  Please show the CHEMOTHERAPY ALERT CARD or IMMUNOTHERAPY ALERT CARD at check-in to the  Emergency Department and triage nurse.  Should you have questions after your visit or need to cancel or reschedule your appointment, please contact [facility]  Dept: [phone]  and follow the prompts.  Office hours are 8:00 a.m. to 4:30 p.m. Monday - Friday. Please note that voicemails left after 4:00 p.m. may not be returned until the following business day.  We are closed weekends and major holidays. You have access to a nurse at all times for urgent questions. Please call the main number to the clinic Dept: [phone] and follow the prompts.   For any non-urgent questions, you may also contact your provider using MyChart. We now offer e-Visits for anyone 104 and older to request care online for non-urgent symptoms. For details visit [email or web address].   Also download the MyChart app! Go to the app store, search "MyChart", open the app, select [facility], and log in with your MyChart username and password.  Due to Covid, a mask is required upon entering the hospital/clinic. If you do not have a mask, one will be given to you upon arrival. For doctor visits, patients may have 1 support person aged 79 or older with them. For treatment visits, patients cannot have anyone with them due to current Covid guidelines and our immunocompromised population.   Nivolumab injection What is this medication? NIVOLUMAB (nye VOL ue mab) is a monoclonal antibody. It treats certain types of cancer. Some of the cancers treated are colon cancer, head and neck cancer, Hodgkin lymphoma, lung cancer, and melanoma. This medicine may be used for other purposes; ask  your health care provider or pharmacist if you have questions. COMMON BRAND NAME(S): Opdivo What should I tell my care team before I take this medication? They need to know if you have any of these conditions: Autoimmune diseases such as Crohn's disease, ulcerative colitis, or lupus Have had or planning to have an  allogeneic stem cell transplant (uses someone else's stem cells) History of chest radiation Organ transplant Nervous system problems such as myasthenia gravis or Guillain-Barre syndrome An unusual or allergic reaction to nivolumab, other medicines, foods, dyes, or preservatives Pregnant or trying to get pregnant Breast-feeding How should I use this medication? This medication is injected into a vein. It is given in a hospital or clinic setting. A special MedGuide will be given to you before each treatment. Be sure to read this information carefully each time. Talk to your care team regarding the use of this medication in children. While it may be prescribed for children as young as 12 years for selected conditions, precautions do apply. Overdosage: If you think you have taken too much of this medicine contact a poison control center or emergency room at once. NOTE: This medicine is only for you. Do not share this medicine with others. What if I miss a dose? Keep appointments for follow-up doses. It is important not to miss your dose. Call your care team if you are unable to keep an appointment. What may interact with this medication? Interactions have not been studied. This list may not describe all possible interactions. Give your health care provider a list of all the medicines, herbs, non-prescription drugs, or dietary supplements you use. Also tell them if you smoke, drink alcohol, or use illegal drugs. Some items may interact with your medicine. What should I watch for while using this medication? Your condition will be monitored carefully while you are receiving this medication. You may need blood work done while you are taking this medication. Do not become pregnant while taking this medication or for 5 months after stopping it. Women should inform their care team if they wish to become pregnant or think they might be pregnant. There is a potential for serious harm to an unborn child.  Talk to your care team for more information. Do not breast-feed an infant while taking this medication or for 5 months after stopping it. What side effects may I notice from receiving this medication? Side effects that you should report to your care team as soon as possible: Allergic reactions--skin rash, itching, hives, swelling of the face, lips, tongue, or throat Bloody or black, tar-like stools Change in vision Chest pain Diarrhea Dry cough, shortness of breath or trouble breathing Eye pain Fast or irregular heartbeat Fever, chills High blood sugar (hyperglycemia)--increased thirst or amount of urine, unusual weakness or fatigue, blurry vision High thyroid levels (hyperthyroidism)--fast or irregular heartbeat, weight loss, excessive sweating or sensitivity to heat, tremors or shaking, anxiety, nervousness, irregular menstrual cycle or spotting Kidney injury--decrease in the amount of urine, swelling of the ankles, hands, or feet Liver injury--right upper belly pain, loss of appetite, nausea, light-colored stool, dark yellow or brown urine, yellowing skin or eyes, unusual weakness or fatigue Low red blood cell count--unusual weakness or fatigue, dizziness, headache, trouble breathing Low thyroid levels (hypothyroidism)--unusual weakness or fatigue, increased sensitivity to cold, constipation, hair loss, dry skin, weight gain, feelings of depression Mood and behavior changes-confusion, change in sex drive or performance, irritability Muscle pain or cramps Pain, tingling, or numbness in the hands or feet, muscle weakness,  trouble walking, loss of balance or coordination Red or dark brown urine Redness, blistering, peeling, or loosening of the skin, including inside the mouth Stomach pain Unusual bruising or bleeding Side effects that usually do not require medical attention (report to your care team if they continue or are bothersome): Bone pain Constipation Loss of  appetite Nausea Tiredness Vomiting This list may not describe all possible side effects. Call your doctor for medical advice about side effects. You may report side effects to FDA at 1-800-FDA-1088. Where should I keep my medication? This medication is given in a hospital or clinic and will not be stored at home. NOTE: This sheet is a summary. It may not cover all possible information. If you have questions about this medicine, talk to your doctor, pharmacist, or health care provider.  2022 Elsevier/Gold Standard (2021-05-25 00:00:00)

## 2021-09-22 NOTE — Progress Notes (Signed)
Patient presents for treatment. RN assessment completed along with the following:  Labs/vitals reviewed - Yes, and within treatment parameters.   Weight within 10% of previous measurement -  Ned Card, NP aware of 10 lb weight loss.  Chemo dosage is not weight-based.  Per Ned Card, NP ok to proceed with treatment.  Oncology Treatment Attestation completed for current therapy- Yes, on date 08/17/21 Informed consent completed and reflects current therapy/intent - Yes, on date 08/25/21             Provider progress note reviewed - Yes, today's provider note was reviewed. Treatment/Antibody/Supportive plan reviewed - Yes, and there are no adjustments needed for today's treatment. S&H and other orders reviewed - Yes, and there are no additional orders identified. Previous treatment date reviewed - Yes, and the appropriate amount of time has elapsed between treatments. Clinic Hand Off Received from - Ned Card, NP  Patient to proceed with treatment.

## 2021-09-27 ENCOUNTER — Ambulatory Visit: Payer: Self-pay

## 2021-09-27 ENCOUNTER — Ambulatory Visit (INDEPENDENT_AMBULATORY_CARE_PROVIDER_SITE_OTHER): Payer: Medicare HMO | Admitting: Physician Assistant

## 2021-09-27 ENCOUNTER — Other Ambulatory Visit: Payer: Self-pay

## 2021-09-27 ENCOUNTER — Encounter: Payer: Self-pay | Admitting: Physician Assistant

## 2021-09-27 ENCOUNTER — Encounter: Payer: Medicare HMO | Admitting: Rehabilitative and Restorative Service Providers"

## 2021-09-27 DIAGNOSIS — M25511 Pain in right shoulder: Secondary | ICD-10-CM | POA: Diagnosis not present

## 2021-09-27 DIAGNOSIS — G8929 Other chronic pain: Secondary | ICD-10-CM | POA: Diagnosis not present

## 2021-09-27 NOTE — Progress Notes (Addendum)
Office Visit Note   Patient: Ashley Moore           Date of Birth: 1950-12-10           MRN: [id number] Visit Date: 09/27/2021              Requested by: [name], MD [place],  [place] [address] PCP: [name], MD  Chief Complaint  Patient presents with   Right Shoulder - Pain      HPI: Is a pleasant 71 year old woman who follows up today on her right shoulder pain and stiffness.  This is status post a fall earlier this year.  I saw her 2-1/2 months after the fall and she was sent to physical therapy to improve her range of motion.  She comes in today because her shoulder still continues to be stiff no is improved.  She did find physical therapy helpful.  She was undergoing chemotherapy for metastatic anal cancer.  She said that treatment was difficult to tolerate and she was in the hospital several times and was not using her arm much.  She is now on immunotherapy which she is tolerating better.  She is left arm dominant.  She complains that she cannot bring her arm up very far above her head  Assessment & Plan: Visit Diagnoses:  1. Chronic right shoulder pain     Plan: Advanced glenohumeral arthritis I had a long talk with the patient she understands that I am not sure anything can be done to improve her motion however we will send her to physical therapy 1 more time.  She is not crazy about steroid injections because of her history of chemotherapy and immunosuppression.  She will follow-up in 1 month we will order physical therapy in the meantime  Follow-Up Instructions: No follow-ups on file.   Ortho Exam Examination of her right shoulder.  Distal pulses are intact she has no acute swelling.  She has good strength with resisted abduction.  She has stiffness with internal rotation behind her back she can go to her that her buttock.  She said this is better.  She has forward elevation to about 95 degrees at this point it gets stiff and painful  but she can push through this.  She has good strength with resistance.  Findings more consistent with arthritis then impingement She has crepitus with internal external rotation of her shoulder Patient is alert, oriented, no adenopathy, well-dressed, normal affect, normal respiratory effort.   Imaging: No results found. No images are attached to the encounter.  Labs: Lab Results  Component Value Date   HGBA1C 5.5 05/11/2015   REPTSTATUS 05/21/2021 FINAL 05/16/2021   REPTSTATUS 05/21/2021 FINAL 05/16/2021   CULT  05/16/2021    NO GROWTH 5 DAYS Performed at [facility], [place] [address], [place], [place] [address]    CULT  05/16/2021    NO GROWTH 5 DAYS Performed at [facility] [address], [place], [place] [address]      Lab Results  Component Value Date   ALBUMIN 3.9 09/22/2021   ALBUMIN 4.1 09/08/2021   ALBUMIN 4.0 08/25/2021    Lab Results  Component Value Date   MG 2.3 01/27/2021   MG 2.5 (H) 01/23/2021   MG 2.3 01/19/2021   Lab Results  Component Value Date   VD25OH 48 08/10/2010   VD25OH 46 11/11/2008    No results found for: PREALBUMIN CBC EXTENDED Latest Ref  Rng & Units 09/22/2021 09/08/2021 08/25/2021  WBC 4.0 - 10.5 K/uL 4.9 3.7(L) 3.0(L)  RBC 3.87 - 5.11 MIL/uL 3.86(L) 3.57(L) 3.48(L)  HGB 12.0 - 15.0 g/dL 10.7(L) 10.0(L) 9.9(L)  HCT 36.0 - 46.0 % 34.8(L) 32.8(L) 32.5(L)  PLT 150 - 400 K/uL 248 209 127(L)  NEUTROABS 1.7 - 7.7 K/uL 3.9 2.7 2.1  LYMPHSABS 0.7 - 4.0 K/uL 0.5(L) 0.4(L) 0.4(L)     There is no height or weight on file to calculate BMI.  Orders:  Orders Placed This Encounter  Procedures   XR Shoulder Right   No orders of the defined types were placed in this encounter.    Procedures: No procedures performed  Clinical Data: No additional findings.  ROS:  All other systems negative, except as noted in the HPI. Review of Systems  All other systems reviewed and are negative.  Objective: Vital Signs: There were  no vitals taken for this visit.  Specialty Comments:  No specialty comments available.  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   Genetic testing 01/27/2020   Family history of breast cancer    Family history of lung cancer    Family history of ovarian cancer    Family history of non-Hodgkin's lymphoma    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   Port catheter in place 11/21/2016   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   Anal cancer ([facility]) 11/03/2016   Cystitis 05/06/2015   Degenerative cervical disc 04/08/2015   Trigger point of neck 03/26/2015   Pain in joint, ankle and foot 04/23/2013   Visit for preventive health examination 02/13/2013   Routine gynecological examination 02/13/2013   Family history of breast cancer in first degree relative 02/13/2013   Rash and nonspecific skin eruption 02/13/2013   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   VITAMIN D DEFICIENCY 06/25/2008   NECK PAIN 06/02/2008   FOOT PAIN 06/02/2008   Osteopenia 06/02/2008   Past Medical History:  Diagnosis Date   Arthritis    Bowen's disease    excised 1992   Chronic low back pain    Chronic radiation cystitis    DDD (degenerative disc disease), cervical    Family history of breast cancer    Family history of lung cancer    Family history of non-Hodgkin's lymphoma    Family history of ovarian cancer    Headache    Hx of varicella    Hydronephrosis  of right kidney    urologist--- dr Tresa Moore,  malignant,  treated with ureter stent   Hypothyroidism    followed by pcp   Lower urinary tract symptoms (LUTS)    01-12-2021  per pt treated with accupuncture at dr Tresa Moore office   Lymphedema of both lower extremities    PICC (peripherally inserted central catheter) in place 01/11/2021   placed at [facility] in [place], [place] for IV antibiotics   Positive blood culture 01/08/2021   Klebsiella Pneumaniae,  treated with daily IV antibiotic   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,  invasive SCC , completed chemo/ radiation 12-23-2016;  recurrent metastatsis retroperitoneal lymphdenopathy,  completed radiation 04-13-2020 residual right ureteral uropathy obstruction   Retroperitoneal lymphadenopathy    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   Sepsis due to Klebsiella pneumoniae ([place] [facility]) 01/07/2021   pt admitted to [facility] in [place],  dx sepsis secondary to acute pyelonephritis with bacteremia , positive blood culture,  discharged 01-11-2021 home daily IV antibiotic   Wears glasses     Family History  Problem Relation Age of Onset   Lung cancer Mother        lung   Breast cancer Mother 32   Ovarian cancer Mother 25   Pancreatitis Father    Breast cancer Sister 55   Breast cancer Maternal Grandmother 82       breast    Breast cancer Maternal Aunt 75       breast   Melanoma Sister    Breast cancer Sister 38   Non-Hodgkin's lymphoma Paternal Grandmother     Past Surgical History:  Procedure Laterality Date   BUNIONECTOMY Right yrs ago   CYSTOSCOPY W/ URETERAL STENT PLACEMENT Right 06/12/2020   Procedure: CYSTOSCOPY WITH RETROGRADE PYELOGRAM/URETERAL STENT EXCHANGE;  Surgeon: Alexis Frock, MD;  Location: [facility];  Service: Urology;  Laterality: Right;   CYSTOSCOPY W/ URETERAL STENT PLACEMENT Right 01/13/2021   Procedure: CYSTOSCOPY WITH  RETROGRADE PYELOGRAM/URETERAL STENT REMOVALRIGHT;  Surgeon: Alexis Frock, MD;  Location: [facility];  Service: Urology;  Laterality: Right;  [place], URETEROSCOPY AND STENT PLACEMENT Right 01/28/2020   Procedure: CYSTOSCOPY WITH RETROGRADE PYELOGRAM, URETEROSCOPY AND STENT PLACEMENT;  Surgeon: Alexis Frock, MD;  Location: [facility];  Service: Urology;  Laterality: Right;   IR GENERIC HISTORICAL  11/14/2016   IR US GUIDE VASC ACCESS RIGHT 11/14/2016 [facility]   IR GENERIC HISTORICAL  11/14/2016   IR FLUORO GUIDE CV LINE RIGHT 11/14/2016 [facility]   IR GENERIC HISTORICAL  12/12/2016   IR US GUIDE VASC ACCESS RIGHT 12/12/2016 [name], MD [facility]   IR GENERIC HISTORICAL  12/12/2016   IR FLUORO GUIDE CV LINE RIGHT 12/12/2016 [name], MD [facility]   SKIN CANCER EXCISION  1990   bowens disease   Social History   Occupational History   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

## 2021-09-27 NOTE — Addendum Note (Signed)
Addended by: Georgette Dover on: 09/27/2021 02:47 PM   Modules accepted: Orders

## 2021-10-03 ENCOUNTER — Other Ambulatory Visit: Payer: Self-pay | Admitting: Oncology

## 2021-10-06 ENCOUNTER — Inpatient Hospital Stay: Payer: Medicare HMO

## 2021-10-06 ENCOUNTER — Inpatient Hospital Stay: Payer: Medicare HMO | Admitting: Oncology

## 2021-10-06 ENCOUNTER — Other Ambulatory Visit: Payer: Self-pay

## 2021-10-06 ENCOUNTER — Encounter: Payer: Self-pay | Admitting: *Deleted

## 2021-10-06 VITALS — BP 137/80 | HR 81 | Temp 98.0°F | Resp 18

## 2021-10-06 VITALS — BP 148/82 | HR 95 | Temp 97.8°F | Resp 18 | Ht 62.0 in | Wt 160.2 lb

## 2021-10-06 DIAGNOSIS — C210 Malignant neoplasm of anus, unspecified: Secondary | ICD-10-CM | POA: Diagnosis not present

## 2021-10-06 DIAGNOSIS — C772 Secondary and unspecified malignant neoplasm of intra-abdominal lymph nodes: Secondary | ICD-10-CM

## 2021-10-06 DIAGNOSIS — Z5112 Encounter for antineoplastic immunotherapy: Secondary | ICD-10-CM | POA: Diagnosis not present

## 2021-10-06 DIAGNOSIS — Z79899 Other long term (current) drug therapy: Secondary | ICD-10-CM | POA: Diagnosis not present

## 2021-10-06 LAB — CMP (CANCER CENTER ONLY)
ALT: 14 U/L (ref 0–44)
AST: 26 U/L (ref 15–41)
Albumin: 3.8 g/dL (ref 3.5–5.0)
Alkaline Phosphatase: 84 U/L (ref 38–126)
Anion gap: 10 (ref 5–15)
BUN: 29 mg/dL — ABNORMAL HIGH (ref 8–23)
CO2: 22 mmol/L (ref 22–32)
Calcium: 9.4 mg/dL (ref 8.9–10.3)
Chloride: 101 mmol/L (ref 98–111)
Creatinine: 0.91 mg/dL (ref 0.44–1.00)
GFR, Estimated: >60 mL/min (ref >60)
Glucose, Bld: 87 mg/dL (ref 70–99)
Potassium: 4.6 mmol/L (ref 3.5–5.1)
Sodium: 133 mmol/L — ABNORMAL LOW (ref 135–145)
Total Bilirubin: 0.3 mg/dL (ref 0.3–1.2)
Total Protein: 7.6 g/dL (ref 6.5–8.1)

## 2021-10-06 LAB — CBC WITH DIFFERENTIAL (CANCER CENTER ONLY)
Abs Immature Granulocytes: 0.02 10*3/uL (ref 0.00–0.07)
Basophils Absolute: 0 10*3/uL (ref 0.0–0.1)
Basophils Relative: 1 %
Eosinophils Absolute: 0.1 10*3/uL (ref 0.0–0.5)
Eosinophils Relative: 3 %
HCT: 32.7 % — ABNORMAL LOW (ref 36.0–46.0)
Hemoglobin: 10.3 g/dL — ABNORMAL LOW (ref 12.0–15.0)
Immature Granulocytes: 1 %
Lymphocytes Relative: 11 %
Lymphs Abs: 0.4 10*3/uL — ABNORMAL LOW (ref 0.7–4.0)
MCH: 27.5 pg (ref 26.0–34.0)
MCHC: 31.5 g/dL (ref 30.0–36.0)
MCV: 87.4 fL (ref 80.0–100.0)
Monocytes Absolute: 0.5 10*3/uL (ref 0.1–1.0)
Monocytes Relative: 14 %
Neutro Abs: 2.6 10*3/uL (ref 1.7–7.7)
Neutrophils Relative %: 70 %
Platelet Count: 187 10*3/uL (ref 150–400)
RBC: 3.74 MIL/uL — ABNORMAL LOW (ref 3.87–5.11)
RDW: 16.1 % — ABNORMAL HIGH (ref 11.5–15.5)
WBC Count: 3.6 10*3/uL — ABNORMAL LOW (ref 4.0–10.5)
nRBC: 0 % (ref 0.0–0.2)

## 2021-10-06 MED ORDER — SODIUM CHLORIDE 0.9 % IV SOLN: Freq: Once | INTRAVENOUS | Status: AC

## 2021-10-06 MED ORDER — SODIUM CHLORIDE 0.9 % IV SOLN
240.0000 mg | Freq: Once | INTRAVENOUS | Status: AC
  Administered 2021-10-06: 240 mg via INTRAVENOUS
  Filled 2021-10-06: qty 24

## 2021-10-06 NOTE — Progress Notes (Signed)
Patient seen by Dr. Sherrill today ? ?Vitals are within treatment parameters. ? ?Labs reviewed by Dr. Sherrill and are within treatment parameters. ? ?Per physician team, patient is ready for treatment and there are NO modifications to the treatment plan.  ?

## 2021-10-06 NOTE — Progress Notes (Signed)
[facility] OFFICE PROGRESS NOTE   Diagnosis: Anal cancer  INTERVAL HISTORY:   Ashley Moore completed another cycle of nivolumab on 09/22/2021.  No rash or diarrhea.  She developed an upper respiratory infection approximately 3 weeks ago with nasal/upper airway congestion, a cough, and hoarseness.  Doxycycline did not help.  She continues to have a cough.  This has improved.  No dyspnea.  No fever. The neck lymph nodes have resolved.  Leg swelling is much improved.  Objective:  Vital signs in last 24 hours:  Blood pressure (!) 148/82, pulse 95, temperature 97.8 F (36.6 C), temperature source Oral, resp. rate 18, height 5\' 2"  (1.575 m), weight 160 lb 3.2 oz (72.7 kg), SpO2 100 %.    Lymphatics: Less than 1 cm mobile left low cervical/scalene nodes, no inguinal nodes Resp: Scattered inspiratory rhonchi and rales bilaterally, no respiratory distress Cardio: Regular rate and rhythm GI: Nontender, no mass, no hepatosplenomegaly Vascular: 1+ edema to lower leg bilaterally   Lab Results:  Lab Results  Component Value Date   WBC 3.6 (L) 10/06/2021   HGB 10.3 (L) 10/06/2021   HCT 32.7 (L) 10/06/2021   MCV 87.4 10/06/2021   PLT 187 10/06/2021   NEUTROABS 2.6 10/06/2021    CMP  Lab Results  Component Value Date   NA 133 (L) 10/06/2021   K 4.6 10/06/2021   CL 101 10/06/2021   CO2 22 10/06/2021   GLUCOSE 87 10/06/2021   BUN 29 (H) 10/06/2021   CREATININE 0.91 10/06/2021   CALCIUM 9.4 10/06/2021   PROT 7.6 10/06/2021   ALBUMIN 3.8 10/06/2021   AST 26 10/06/2021   ALT 14 10/06/2021   ALKPHOS 84 10/06/2021   BILITOT 0.3 10/06/2021   GFRNONAA >60 10/06/2021   GFRAA >60 06/17/2020    Lab Results  Component Value Date   CEA1 1.15 03/04/2021   CEA 1.16 03/04/2021    Medications: I have reviewed the patient's current medications.   Assessment/Plan:  Anal cancer CT abdomen/pelvis 10/21/2016-thickening of the anus extending to the junction between the anus  and rectum with a large stool ball in the rectum and mild fat stranding posterior to the rectum. Enlarged lymph nodes in the right and left inguinal regions. A few mildly prominent nodes seen posterior to the rectum. 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 PET scan [id number] hypermetabolic anal mass with hypermetabolic metastases to the groin region bilaterally, left pelvic sidewall and presacral space. Initiation of radiation and cycle 1 5-FU/mitomycin C 11/14/2016 Cycle 2 5-FU/mitomycin C 12/12/2016 (5-FU dose reduced due to mucositis, diarrhea, skin breakdown) Radiation completed 12/23/2016 CT abdomen/pelvis 04/14/2017-resolution of anal mass and bilateral inguinal lymphadenopathy. No residual tumor seen. CT abdomen/pelvis 01/28/2020-right hydroureteronephrosis, transition at the level of the mid right ureter, retroperitoneal lymphadenopathy CT renal stone study 01/28/2020-severe right hydronephrosis and proximal right hydroureter, 8 mm area of increased attenuation in the proximal right ureter-stone versus soft tissue mass PET 02/13/2020-hypermetabolic right retroperitoneal nodal metastases in the aortocaval and posterior pericaval chains, nonspecific mild anal wall hypermetabolism, right nephroureteral stent 02/21/2020 anorectal exam per Dr. Ashley Jacobs radiation changes of the skin around the anal margin.  Posterior midline smooth scarring.  Mildly stenotic.  Stenosis seems better.  No palpable concerning lesions.  Entire anal canal and distal rectum feels smooth and healthy.  Partial anoscopy performed with no lesions in the anal canal. Xeloda/radiation beginning 03/02/2020, completed 04/13/2020 CT abdomen/pelvis 06/17/2020-resolution of right retroperitoneal lymphadenopathy, no remaining pathologically enlarged lymph nodes, residual mild right  hydronephrosis, no evidence of progressive disease, stable anal wall thickening Digital rectal exam by Dr. Quentin Cornwall  08/26/2020-posterior midline smooth scarring.  Mildly stenotic.  Stenosis seems better.  No palpable concerning lesions.  Anal canal feels smooth without palpable concern.  Unable to tolerate anoscopy.  Next visit in 6 months for surveillance. CT abdomen/pelvis 10/28/2020-no evidence of local recurrence or metastatic disease.  Stable mild rectal wall thickening.  New diffuse bladder wall thickening possibly related to prior radiation CT abdomen/pelvis 01/16/2021- recurrent right-sided hydronephrosis and proximal right hydroureter status post removal of right ureter stent, new soft tissue nodule along the course of the right ureter between the right psoas and IVC suspicious for recurrent lymphadenopathy PET 02/12/2021-small focus of hypermetabolism at the anorectal junction without a definable mass, right retroperitoneal nodal metastasis with obstruction of the proximal right ureter, right middle lobe hypermetabolic nodule, hypermetabolism in the right hilum without a defined nodal mass, hypermetabolic left supraclavicular and left axillary nodes 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 Cycle 1 Taxol/carboplatin 03/11/2021 Cycle 2 Taxol/carboplatin 03/31/2021, Fulphila Cycle 3 Taxol/carboplatin 04/21/2021, Fulphila CTs 05/11/2021-decrease size of FDG avid nodule in right middle lobe, right middle lobe subpleural nodule slightly enlarged, decreased in size of left axillary and subpectoral nodes, unchanged FDG avid right iliac nodes Cycle 4 Taxol/carboplatin 05/12/2021, G-CSF discontinued secondary to poor tolerance, Taxol and carboplatin dose reduced Cycle 5 Taxol/carboplatin 06/02/2021 Cycle 6 carboplatin 07/01/2021, Taxol held due to neuropathy Cycle 7 carboplatin 07/29/2021, Taxol held due to neuropathy CTs 08/16/2021-new liver lesion, increase in right psoas lymph node, unchanged right hydronephrosis, new mediastinal and right hilar lymphadenopathy,  enlargement of bilateral pulmonary nodules and at least one new nodule, new subcarinal node Cycle 1 nivolumab 08/25/2021 Cycle 2 nivolumab 09/08/2021 Cycle 3 nivolumab 09/22/2021 Cycle 4 nivolumab 10/06/2021 Left labial lesions. Question direct extension from the anal cancer versus metastatic disease from anal cancer versus a separate malignant process. 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 Multiple family members with breast cancer. History of hypokalemia-likely secondary to decreased nutritional intake and diarrhea; potassium in normal range 01/16/2017. No longer taking a potassium supplement. Right hydroureteronephrosis on CT 01/28/2020, status post a cystoscopy/pyelogram 01/28/2020 confirming extrinsic compression of the right ureter, status post stent placement Right ureter stent exchange 06/12/2020 Right ureter stent removed 01/13/2021   8.  Admission 01/07/2021 with Klebsiella pneumoniae urosepsis/bacteremia  9.  Hospital admission 05/15/2021 with UTI/possible right pyelonephritis      Disposition: Ashley Moore has completed 3 treatments with nivolumab.  Her performance status has improved.  Palpable lymph nodes in the left neck are smaller.  She will complete cycle 4 today.  Ashley Moore had an upper respiratory infection last month.  She has a persistent cough.  She will follow-up with Dr. Regis Bill if the cough and hoarseness do not improve.  She will call for shortness of breath or fever.  I have a low clinical suspicion for nivolumab induced pneumonitis.  Ashley Moore will return for an office visit in nivolumab in 2 weeks.    Betsy Coder, MD  10/06/2021  12:28 PM

## 2021-10-06 NOTE — Patient Instructions (Signed)
Ashley Moore   Discharge Instructions: Thank you for choosing [facility] to provide your oncology and hematology care.   If you have a lab appointment with the [facility], please go directly to the [facility] and check in at the registration area.   Wear comfortable clothing and clothing appropriate for easy access to any Portacath or PICC line.   We strive to give you quality time with your provider. You may need to reschedule your appointment if you arrive late (15 or more minutes).  Arriving late affects you and other patients whose appointments are after yours.  Also, if you miss three or more appointments without notifying the office, you may be dismissed from the clinic at the providers discretion.      For prescription refill requests, have your pharmacy contact our office and allow 72 hours for refills to be completed.    Today you received the following chemotherapy and/or immunotherapy agents Nivolumab (OPDIVO).      To help prevent nausea and vomiting after your treatment, we encourage you to take your nausea medication as directed.  BELOW ARE SYMPTOMS THAT SHOULD BE REPORTED IMMEDIATELY: *FEVER GREATER THAN 100.4 F (38 C) OR HIGHER *CHILLS OR SWEATING *NAUSEA AND VOMITING THAT IS NOT CONTROLLED WITH YOUR NAUSEA MEDICATION *UNUSUAL SHORTNESS OF BREATH *UNUSUAL BRUISING OR BLEEDING *URINARY PROBLEMS (pain or burning when urinating, or frequent urination) *BOWEL PROBLEMS (unusual diarrhea, constipation, pain near the anus) TENDERNESS IN MOUTH AND THROAT WITH OR WITHOUT PRESENCE OF ULCERS (sore throat, sores in mouth, or a toothache) UNUSUAL RASH, SWELLING OR PAIN  UNUSUAL VAGINAL DISCHARGE OR ITCHING   Items with * indicate a potential emergency and should be followed up as soon as possible or go to the Emergency Department if any problems should occur.  Please show the CHEMOTHERAPY ALERT CARD or IMMUNOTHERAPY ALERT CARD at  check-in to the Emergency Department and triage nurse.  Should you have questions after your visit or need to cancel or reschedule your appointment, please contact [facility]  Dept: [phone]  and follow the prompts.  Office hours are 8:00 a.m. to 4:30 p.m. Monday - Friday. Please note that voicemails left after 4:00 p.m. may not be returned until the following business day.  We are closed weekends and major holidays. You have access to a nurse at all times for urgent questions. Please call the main number to the clinic Dept: [phone] and follow the prompts.   For any non-urgent questions, you may also contact your provider using MyChart. We now offer e-Visits for anyone 60 and older to request care online for non-urgent symptoms. For details visit [email or web address].   Also download the MyChart app! Go to the app store, search "MyChart", open the app, select [facility], and log in with your MyChart username and password.  Due to Covid, a mask is required upon entering the hospital/clinic. If you do not have a mask, one will be given to you upon arrival. For doctor visits, patients may have 1 support person aged 47 or older with them. For treatment visits, patients cannot have anyone with them due to current Covid guidelines and our immunocompromised population.   Nivolumab injection What is this medication? NIVOLUMAB (nye VOL ue mab) is a monoclonal antibody. It treats certain types of cancer. Some of the cancers treated are colon cancer, head and neck cancer, Hodgkin lymphoma, lung cancer, and melanoma. This medicine may be used for other purposes;  ask your health care provider or pharmacist if you have questions. COMMON BRAND NAME(S): Opdivo What should I tell my care team before I take this medication? They need to know if you have any of these conditions: Autoimmune diseases such as Crohn's disease, ulcerative colitis, or lupus Have had or planning to  have an allogeneic stem cell transplant (uses someone else's stem cells) History of chest radiation Organ transplant Nervous system problems such as myasthenia gravis or Guillain-Barre syndrome An unusual or allergic reaction to nivolumab, other medicines, foods, dyes, or preservatives Pregnant or trying to get pregnant Breast-feeding How should I use this medication? This medication is injected into a vein. It is given in a hospital or clinic setting. A special MedGuide will be given to you before each treatment. Be sure to read this information carefully each time. Talk to your care team regarding the use of this medication in children. While it may be prescribed for children as young as 12 years for selected conditions, precautions do apply. Overdosage: If you think you have taken too much of this medicine contact a poison control center or emergency room at once. NOTE: This medicine is only for you. Do not share this medicine with others. What if I miss a dose? Keep appointments for follow-up doses. It is important not to miss your dose. Call your care team if you are unable to keep an appointment. What may interact with this medication? Interactions have not been studied. This list may not describe all possible interactions. Give your health care provider a list of all the medicines, herbs, non-prescription drugs, or dietary supplements you use. Also tell them if you smoke, drink alcohol, or use illegal drugs. Some items may interact with your medicine. What should I watch for while using this medication? Your condition will be monitored carefully while you are receiving this medication. You may need blood work done while you are taking this medication. Do not become pregnant while taking this medication or for 5 months after stopping it. Women should inform their care team if they wish to become pregnant or think they might be pregnant. There is a potential for serious harm to an unborn  child. Talk to your care team for more information. Do not breast-feed an infant while taking this medication or for 5 months after stopping it. What side effects may I notice from receiving this medication? Side effects that you should report to your care team as soon as possible: Allergic reactions--skin rash, itching, hives, swelling of the face, lips, tongue, or throat Bloody or black, tar-like stools Change in vision Chest pain Diarrhea Dry cough, shortness of breath or trouble breathing Eye pain Fast or irregular heartbeat Fever, chills High blood sugar (hyperglycemia)--increased thirst or amount of urine, unusual weakness or fatigue, blurry vision High thyroid levels (hyperthyroidism)--fast or irregular heartbeat, weight loss, excessive sweating or sensitivity to heat, tremors or shaking, anxiety, nervousness, irregular menstrual cycle or spotting Kidney injury--decrease in the amount of urine, swelling of the ankles, hands, or feet Liver injury--right upper belly pain, loss of appetite, nausea, light-colored stool, dark yellow or brown urine, yellowing skin or eyes, unusual weakness or fatigue Low red blood cell count--unusual weakness or fatigue, dizziness, headache, trouble breathing Low thyroid levels (hypothyroidism)--unusual weakness or fatigue, increased sensitivity to cold, constipation, hair loss, dry skin, weight gain, feelings of depression Mood and behavior changes-confusion, change in sex drive or performance, irritability Muscle pain or cramps Pain, tingling, or numbness in the hands or feet, muscle  weakness, trouble walking, loss of balance or coordination Red or dark brown urine Redness, blistering, peeling, or loosening of the skin, including inside the mouth Stomach pain Unusual bruising or bleeding Side effects that usually do not require medical attention (report to your care team if they continue or are bothersome): Bone pain Constipation Loss of  appetite Nausea Tiredness Vomiting This list may not describe all possible side effects. Call your doctor for medical advice about side effects. You may report side effects to FDA at 1-800-FDA-1088. Where should I keep my medication? This medication is given in a hospital or clinic and will not be stored at home. NOTE: This sheet is a summary. It may not cover all possible information. If you have questions about this medicine, talk to your doctor, pharmacist, or health care provider.  2022 Elsevier/Gold Standard (2021-05-25 00:00:00)

## 2021-10-06 NOTE — Progress Notes (Signed)
Patient presents for treatment. RN assessment completed along with the following:  Labs/vitals reviewed - Yes, and within treatment parameters.   Weight within 10% of previous measurement - Yes Informed consent completed and reflects current therapy/intent - Yes, on date 08/25/2021             Provider progress note reviewed - Yes, today's provider note was reviewed. Treatment/Antibody/Supportive plan reviewed - Yes, and there are no adjustments needed for today's treatment. S&H and other orders reviewed - Yes, and there are no additional orders identified. Previous treatment date reviewed - Yes, and the appropriate amount of time has elapsed between treatments. Clinic Hand Off Received from - Yes from [facility], RN  Patient to proceed with treatment.

## 2021-10-11 ENCOUNTER — Ambulatory Visit: Payer: Medicare HMO | Admitting: Physical Therapy

## 2021-10-14 ENCOUNTER — Telehealth (INDEPENDENT_AMBULATORY_CARE_PROVIDER_SITE_OTHER): Payer: Medicare HMO | Admitting: Family Medicine

## 2021-10-14 DIAGNOSIS — R0981 Nasal congestion: Secondary | ICD-10-CM

## 2021-10-14 DIAGNOSIS — R059 Cough, unspecified: Secondary | ICD-10-CM

## 2021-10-14 MED ORDER — DOXYCYCLINE HYCLATE 100 MG PO TABS: 100.0000 mg | ORAL_TABLET | Freq: Two times a day (BID) | ORAL | 0 refills | Status: DC

## 2021-10-14 NOTE — Progress Notes (Signed)
Virtual Visit via Video Note  I connected with Ashley Moore  on 10/14/21 at  1:20 PM EST by a video enabled telemedicine application and verified that I am speaking with the correct person using two identifiers.  Location patient: [place] Location provider:work or home office Persons participating in the virtual visit: patient, provider  I discussed the limitations and requested verbal permission for telemedicine visit. The patient expressed understanding and agreed to proceed.   HPI:  Acute telemedicine visit for sinus issues and a cough: -Onset: the end of December  with a cold -Symptoms include: doing better over all, but still has a lot of white and yellow nasal sinus discharge, pnd and cough and  -she did take the doxy 7 days as reports she was told by her oncologist that he thinks she got pneumonia from the infusions, reports the doxy helped - but feels did not take it long enough  -she was exposed to someone with URI and laryngitis right before getting sick -Denies: fevers, CP, SOB, wheezing, headache, sinus pain - but is on 24/7 tylenol -Pertinent past medical history: see below -Pertinent medication allergies: Allergies  Allergen Reactions   Sulfamethoxazole-Trimethoprim Other (See Comments)    C/o  abd pain and constipation   Benadryl [Diphenhydramine] Other (See Comments)    Tingle all over    Citrus Diarrhea   Melatonin     Tingle all over    Other Diarrhea    greenbeans and citrus   Lemon Oil Diarrhea   Penicillins Rash    Did it involve swelling of the face/tongue/throat, SOB, or low BP? N Did it involve sudden or severe rash/hives, skin peeling, or any reaction on the inside of your mouth or nose? Y Did you need to seek medical attention at a hospital or doctor's office? N When did it last happen? Almost 50 years Ago      If all above answers are NO, may proceed with cephalosporin use.   -COVID-19 vaccine status:  Immunization History  Administered Date(s) Administered    Fluad Quad(high Dose 65+) 05/23/2019, 06/18/2020, 07/22/2021   Influenza Whole 06/25/2008, 08/10/2010   Influenza, High Dose Seasonal PF 06/11/2015, 10/27/2016, 06/21/2017   Influenza,inj,Quad PF,6+ Mos 06/28/2018   PFIZER(Purple Top)SARS-COV-2 Vaccination 11/17/2019, 12/10/2019, 07/02/2020   Pneumococcal Conjugate-13 03/11/2015   Pneumococcal Polysaccharide-23 04/29/2016   Tdap 02/13/2013   Zoster, Live 02/13/2013     ROS: See pertinent positives and negatives per HPI.  Past Medical History:  Diagnosis Date   Arthritis    Bowen's disease    excised 1992   Chronic low back pain    Chronic radiation cystitis    DDD (degenerative disc disease), cervical    Family history of breast cancer    Family history of lung cancer    Family history of non-Hodgkin's lymphoma    Family history of ovarian cancer    Headache    Hx of varicella    Hydronephrosis of right kidney    urologist--- dr Tresa Moore,  malignant,  treated with ureter stent   Hypothyroidism    followed by pcp   Lower urinary tract symptoms (LUTS)    01-12-2021  per pt treated with accupuncture at dr Tresa Moore office   Lymphedema of both lower extremities    PICC (peripherally inserted central catheter) in place 01/11/2021   placed at [facility] in [place], [place] for IV antibiotics   Positive blood culture 01/08/2021   Klebsiella Pneumaniae,  treated with daily IV antibiotic   Rectal  cancer [facility]) oncologist--- dr Benay Spice   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,  invasive SCC , completed chemo/ radiation 12-23-2016;  recurrent metastatsis retroperitoneal lymphdenopathy,  completed radiation 04-13-2020 residual right ureteral uropathy obstruction   Retroperitoneal lymphadenopathy    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   Sepsis due to Klebsiella pneumoniae [facility]) 01/07/2021   pt admitted to [facility] in [place],  dx sepsis secondary to acute pyelonephritis with bacteremia , positive blood  culture,  discharged 01-11-2021 home daily IV antibiotic   Wears glasses     Past Surgical History:  Procedure Laterality Date   BUNIONECTOMY Right yrs ago   [place] Right 06/12/2020   Procedure: CYSTOSCOPY WITH RETROGRADE PYELOGRAM/URETERAL STENT EXCHANGE;  Surgeon: Alexis Frock, MD;  Location: [facility];  Service: Urology;  Laterality: Right;   CYSTOSCOPY W/ URETERAL STENT PLACEMENT Right 01/13/2021   Procedure: CYSTOSCOPY WITH RETROGRADE PYELOGRAM/URETERAL STENT REMOVALRIGHT;  Surgeon: Alexis Frock, MD;  Location: [facility];  Service: Urology;  Laterality: Right;  [place], URETEROSCOPY AND STENT PLACEMENT Right 01/28/2020   Procedure: CYSTOSCOPY WITH RETROGRADE PYELOGRAM, URETEROSCOPY AND STENT PLACEMENT;  Surgeon: Alexis Frock, MD;  Location: [facility];  Service: Urology;  Laterality: Right;   IR GENERIC HISTORICAL  11/14/2016   IR US GUIDE VASC ACCESS RIGHT 11/14/2016 [facility]   IR GENERIC HISTORICAL  11/14/2016   IR FLUORO GUIDE CV LINE RIGHT 11/14/2016 [facility]   IR GENERIC HISTORICAL  12/12/2016   IR US GUIDE VASC ACCESS RIGHT 12/12/2016 [name], MD [facility]   IR GENERIC HISTORICAL  12/12/2016   IR FLUORO GUIDE CV LINE RIGHT 12/12/2016 [name], MD [facility]   SKIN CANCER EXCISION  1990   bowens disease     Current Outpatient Medications:    doxycycline (VIBRA-TABS) 100 MG tablet, Take 1 tablet (100 mg total) by mouth 2 (two) times daily., Disp: 20 tablet, Rfl: 0   acetaminophen (TYLENOL) 500 MG tablet, Take 1,000 mg by mouth every 8 (eight) hours as needed for moderate pain., Disp: , Rfl:    benzonatate (TESSALON PERLES) 100 MG capsule, 1-2 capsules up to twice daily as needed for cough, Disp: 30 capsule, Rfl: 0   Cholecalciferol (VITAMIN D3 PO), Take 2,000 Units by mouth daily., Disp: , Rfl:    conjugated estrogens (PREMARIN) vaginal cream, Place 1  Applicatorful vaginally daily., Disp: , Rfl:    diclofenac Sodium (VOLTAREN) 1 % GEL, Apply 2 g topically daily. To shoulder, Disp: , Rfl:    levothyroxine (SYNTHROID) 75 MCG tablet, Take 1 tablet (75 mcg total) by mouth daily., Disp: 90 tablet, Rfl: 1   loratadine-pseudoephedrine (CLARITIN-D 12 HOUR) 5-120 MG tablet, Take 1 tablet by mouth 2 (two) times daily., Disp: , Rfl:    LORazepam (ATIVAN) 0.5 MG tablet, Take 0.5 mg by mouth as needed for anxiety., Disp: , Rfl:    MYRBETRIQ 50 MG TB24 tablet, Take 50 mg by mouth daily., Disp: , Rfl:    Nutritional Supplements (BOOST PLUS PO), Take by mouth., Disp: , Rfl:    OVER THE COUNTER MEDICATION, Place 1 drop into both eyes daily as needed (dry eyes)., Disp: , Rfl:    polyethylene glycol (MIRALAX) 17 g packet, Take 17 g by mouth daily as needed for mild constipation. (Patient taking differently: Take 17 g by mouth daily.), Disp: , Rfl:    Prenatal Vit-Fe Fumarate-FA (PRENATAL PO), Take by  mouth daily., Disp: , Rfl:   EXAM:  VITALS per patient if applicable:  GENERAL: alert, oriented, appears well and in no acute distress  HEENT: atraumatic, conjunttiva clear, no obvious abnormalities on inspection of external nose and ears  NECK: normal movements of the head and neck  LUNGS: on inspection no signs of respiratory distress, breathing rate appears normal, no obvious gross SOB, gasping or wheezing  CV: no obvious cyanosis  MS: moves all visible extremities without noticeable abnormality  PSYCH/NEURO: pleasant and cooperative, no obvious depression or anxiety, speech and thought processing grossly intact  ASSESSMENT AND PLAN:  Discussed the following assessment and plan:  Nasal sinus congestion  Cough, unspecified type  -we discussed possible serious and likely etiologies, options for evaluation and workup, limitations of telemedicine visit vs in person visit, treatment, treatment risks and precautions. Pt is agreeable to treatment via  telemedicine at this moment. Query sinusitis, perhaps partially treated vs other. Discussed various options and she prefers to try a longer course of doxy as reports she thinks it did help but was not long enough. Also nasal saline sinus rinse and she plans to use OTC options for cough if needed. Advised 1 week inperson follow up to ensure improving and to r/o other etiologies for the cough if not.  Sent message to schedulers to assist and advised patient to contact PCP office to schedule if does not receive call back in next 24 hours. Advise to seek prompt in person care sooner if worsening, new symptoms arise, or if is not improving with treatment as expected per our conversation of expected course. Discussed options for follow up care. Did let this patient know that I do telemedicine on Tuesdays and Thursdays for  and those are the days I am logged into the system.  I discussed the assessment and treatment plan with the patient. The patient was provided an opportunity to ask questions and all were answered. The patient agreed with the plan and demonstrated an understanding of the instructions.     Lucretia Kern, DO

## 2021-10-14 NOTE — Patient Instructions (Signed)
-  I sent the medication(s) we discussed to your pharmacy: Meds ordered this encounter  Medications   doxycycline (VIBRA-TABS) 100 MG tablet    Sig: Take 1 tablet (100 mg total) by mouth 2 (two) times daily.    Dispense:  20 tablet    Refill:  0   Nasal saline sinus rinse twice daily.  Avoiding dairy products can sometimes help too.  I sent a message to Dr. Velora Mediate office requesting that they assist you in scheduling a 1 week follow up in the office to make sure you are doing better. Please call the office if they have not contacted you about this in the next 24-48 hours.   I hope you are feeling better soon!  Seek in person care promptly sooner if your symptoms worsen, new concerns arise or you are not improving with treatment.  It was nice to meet you today. I help Chase out with telemedicine visits on Tuesdays and Thursdays and am happy to help if you need a virtual follow up visit on those days. Otherwise, if you have any concerns or questions following this visit please schedule a follow up visit with your Primary Care office or seek care at a local urgent care clinic to avoid delays in care

## 2021-10-17 ENCOUNTER — Other Ambulatory Visit: Payer: Self-pay | Admitting: Oncology

## 2021-10-19 ENCOUNTER — Ambulatory Visit: Payer: Medicare HMO | Admitting: Physical Therapy

## 2021-10-19 ENCOUNTER — Other Ambulatory Visit: Payer: Self-pay

## 2021-10-19 ENCOUNTER — Encounter: Payer: Self-pay | Admitting: Physical Therapy

## 2021-10-19 DIAGNOSIS — M25511 Pain in right shoulder: Secondary | ICD-10-CM

## 2021-10-19 DIAGNOSIS — M6281 Muscle weakness (generalized): Secondary | ICD-10-CM | POA: Diagnosis not present

## 2021-10-19 DIAGNOSIS — M25611 Stiffness of right shoulder, not elsewhere classified: Secondary | ICD-10-CM | POA: Diagnosis not present

## 2021-10-19 DIAGNOSIS — R293 Abnormal posture: Secondary | ICD-10-CM | POA: Diagnosis not present

## 2021-10-19 DIAGNOSIS — R600 Localized edema: Secondary | ICD-10-CM

## 2021-10-19 DIAGNOSIS — G8929 Other chronic pain: Secondary | ICD-10-CM

## 2021-10-19 NOTE — Therapy (Signed)
OUTPATIENT PHYSICAL THERAPY SHOULDER EVALUATION   Patient Name: Ashley Moore MRN: [id number] DOB:06-04-1951, 71 y.o., female Today's Date: 10/19/2021   PT End of Session - 10/19/21 1311     Visit Number 1    Number of Visits 12    Date for PT Re-Evaluation 01/11/22    Authorization Type [employer] 12 visits applied for on 10/19/2021    Progress Note Due on Visit 10    PT Start Time 1300    PT Stop Time 0814    PT Time Calculation (min) 45 min    Activity Tolerance Patient tolerated treatment well    Behavior During Therapy WFL for tasks assessed/performed             Past Medical History:  Diagnosis Date   Arthritis    Bowen's disease    excised 1992   Chronic low back pain    Chronic radiation cystitis    DDD (degenerative disc disease), cervical    Family history of breast cancer    Family history of lung cancer    Family history of non-Hodgkin's lymphoma    Family history of ovarian cancer    Headache    Hx of varicella    Hydronephrosis of right kidney    urologist--- dr Tresa Moore,  malignant,  treated with ureter stent   Hypothyroidism    followed by pcp   Lower urinary tract symptoms (LUTS)    01-12-2021  per pt treated with accupuncture at dr Tresa Moore office   Lymphedema of both lower extremities    PICC (peripherally inserted central catheter) in place 01/11/2021   placed at [facility] in [place], [place] for IV antibiotics   Positive blood culture 01/08/2021   Klebsiella Pneumaniae,  treated with daily IV antibiotic   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[facility]) oncologist--- dr Benay Spice   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,  invasive SCC , completed chemo/ radiation 12-23-2016;  recurrent metastatsis retroperitoneal lymphdenopathy,  completed radiation 04-13-2020 residual right ureteral uropathy obstruction   Retroperitoneal lymphadenopathy    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   Sepsis due to Klebsiella pneumoniae ([place]) 01/07/2021   pt admitted to [facility] in [place],  dx sepsis secondary to acute pyelonephritis with bacteremia , positive blood culture,  discharged 01-11-2021 home daily IV antibiotic   Wears glasses    Past Surgical History:  Procedure Laterality Date   BUNIONECTOMY Right yrs ago   [place] Right 06/12/2020   Procedure: CYSTOSCOPY WITH RETROGRADE PYELOGRAM/URETERAL STENT EXCHANGE;  Surgeon: Alexis Frock, MD;  Location: [facility];  Service: Urology;  Laterality: Right;   CYSTOSCOPY W/ URETERAL STENT PLACEMENT Right 01/13/2021   Procedure: CYSTOSCOPY WITH RETROGRADE PYELOGRAM/URETERAL STENT REMOVALRIGHT;  Surgeon: Alexis Frock, MD;  Location: [facility];  Service: Urology;  Laterality: Right;  [place], URETEROSCOPY AND STENT PLACEMENT Right 01/28/2020   Procedure: CYSTOSCOPY WITH RETROGRADE PYELOGRAM, URETEROSCOPY AND STENT PLACEMENT;  Surgeon: Alexis Frock, MD;  Location: [facility];  Service: Urology;  Laterality: Right;   IR GENERIC HISTORICAL  11/14/2016   IR US GUIDE VASC ACCESS RIGHT 11/14/2016 [facility]   IR GENERIC HISTORICAL  11/14/2016   IR FLUORO GUIDE CV LINE RIGHT 11/14/2016 [facility]   IR GENERIC HISTORICAL  12/12/2016   IR US GUIDE VASC ACCESS RIGHT 12/12/2016 [name], MD [facility]   IR GENERIC HISTORICAL  12/12/2016   IR FLUORO GUIDE CV  LINE RIGHT 12/12/2016 [name], MD [facility]   SKIN CANCER EXCISION  1990   bowens disease   Patient Active Problem List   Diagnosis Date Noted   Atypical chest pain 06/29/2021   Intractable abdominal pain 05/16/2021   Acute pyelonephritis 05/16/2021   Acute cystitis without hematuria 05/16/2021   Hydronephrosis of right kidney 05/16/2021   Obstructive uropathy 05/16/2021   Hypothyroidism 05/16/2021   Hyponatremia 05/16/2021   Obesity (BMI 30-39.9) 05/16/2021   Goals of care, counseling/discussion 03/01/2021   SIRS (systemic  inflammatory response syndrome) ([place]) 01/13/2021   Pyelonephritis 01/13/2021   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   Genetic testing 01/27/2020   Family history of breast cancer    Family history of lung cancer    Family history of ovarian cancer    Family history of non-Hodgkin's lymphoma    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   Port catheter in place 11/21/2016   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   Anal cancer ([place]) 11/03/2016   Cystitis 05/06/2015   Degenerative cervical disc 04/08/2015   Trigger point of neck 03/26/2015   Pain in joint, ankle and foot 04/23/2013   Visit for preventive health examination 02/13/2013   Routine gynecological examination 02/13/2013   Family history of breast cancer in first degree relative 02/13/2013   Rash and nonspecific skin eruption 02/13/2013   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   VITAMIN D DEFICIENCY 06/25/2008   NECK PAIN 06/02/2008   FOOT PAIN 06/02/2008   Osteopenia 06/02/2008    PCP: [name], MD  REFERRING PROVIDER: Persons, [name], PA  REFERRING DIAG: [phone] (ICD-10-CM) - Chronic right shoulder pain   THERAPY DIAG:  Stiffness of right shoulder, not elsewhere classified  Chronic right shoulder pain  Localized edema  Abnormal posture  Muscle weakness (generalized)   ONSET DATE: 2.5 months ago  SUBJECTIVE:                                                                                                                                                                                      SUBJECTIVE STATEMENT: Is a pleasant 71 year old woman who follows up today on her right shoulder pain and stiffness.  This is status post a fall earlier this year.  She was seen 2.5 months after the fall and she was sent to physical therapy to improve her range of motion.  She comes in today because her shoulder still continues to be stiff no is improved.  She did find physical therapy  helpful and wishes to try it again.  Pt reporting difficulty with lifting her arm overhead and reaching activities as well as ALD's.  She was undergoing chemotherapy for metastatic anal cancer.  She said that treatment was difficult to tolerate and she was in the hospital several times and was not using her arm much.  She is now on immunotherapy which she is tolerating better.  She is left arm dominant.    PERTINENT HISTORY: Arthritis Bowen's diseaseexcised 1992 Chronic low back pain Chronic radiation cystitis DDD (degenerative disc disease), cervical Family history of breast cancer Family history of lung cancer Family history of non-Hodgkin's lymphoma Family history of ovarian cancer Headache Hx of varicella Hydronephrosis of right kidney rectal cancer  PAIN:  Are you having pain? No pt reporting taking pain meds and voltaran gel NPRS scale: 0/10 Pain location: right shoulder Pain orientation: Right  PAIN TYPE: aching Pain description: intermittent  Aggravating factors: reaching outward and overhead Relieving factors: resting, pain medications, topical Voltaren gel  PRECAUTIONS: Other: infusion therapy for Cancer , fall precuations   WEIGHT BEARING RESTRICTIONS No  FALLS:  Has patient fallen in last 6 months? Yes Number of falls: last weekend fell backwards onto wall onto right shoulder. Pt stating she tripped over mat. Pt stating she slid down the wall.   LIVING ENVIRONMENT: Lives with: pets, friends and family coming in and out Lives in: House/apartment Stairs: Yes; External: 4 steps; on right going up and left and can reach both Has following equipment at home: Shower bench and Grab bars, under Engineer, mining bed room   OCCUPATION: Owns gas station  PLOF: Independent  PATIENT GOALS be able to use my arm without pain, reach the back of my head for dressing  OBJECTIVE:   DIAGNOSTIC FINDINGS:  Findings more consistent with arthritis then impingement She has crepitus  with internal external rotation of her shoulder   PATIENT SURVEYS:  FOTO: 51% on 10/19/2021  COGNITION:  Overall cognitive status: Within functional limits for tasks assessed       POSTURE: Rounded shoulders and forward head  PALPATION: Tenderness over anterior shoulder joint line, supraspinatus tendon and bicep tendon  UPPER EXTREMITY AROM/PROM:  Active ROM  In supine Right 10/19/2021 Left 10/19/2021  Shoulder flexion 120 164  Shoulder extension 30 45   Shoulder abduction 98 160  Shoulder adduction    Shoulder internal rotation 75 shoulder abd 45 degrees 75  Shoulder external rotation 15 Shoulder abd 45 degrees 80  Elbow flexion 140  145  Elbow extension 0 0  Wrist flexion    Wrist extension    Wrist ulnar deviation    Wrist radial deviation    Wrist pronation    Wrist supination    (Blank rows = not tested)  Passive ROM Right 10/19/2021 Left 10/19/2021  Shoulder flexion 130   Shoulder extension 35   Shoulder abduction 105   Shoulder adduction    Shoulder internal rotation 80 shoulder abd 45 degrees   Shoulder external rotation 20 Shoulder abd 45 degrees   Elbow flexion    Elbow extension    Wrist flexion    Wrist extension    Wrist ulnar deviation    Wrist radial deviation    Wrist pronation    Wrist supination      UPPER EXTREMITY MMT:  MMT Right 10/19/2021 Left 10/19/2021  Shoulder flexion 2+/5 4+/5  Shoulder extension 2+/5 4+/5  Shoulder abduction 2+/5 4+/5  Shoulder adduction    Shoulder internal rotation 2+/5 4+/5  Shoulder external rotation 2+/5 4+/5  Middle trapezius    Lower trapezius    Elbow flexion  Elbow extension    Wrist flexion    Wrist extension    Wrist ulnar deviation    Wrist radial deviation    Wrist pronation    Wrist supination    Grip strength (lbs) 31.8 pounds 48 pounds   (Blank rows = not tested)  SHOULDER SPECIAL TESTS:  Impingement tests: Neer impingement test: positive  on right        PALPATION:   TTP: anterior joint line and lateral shoulder  POSTURE:  Rounded shoulders, forward head   TODAY'S TREATMENT:  10/19/2021: Therapeutic Exercises:  -Supine shoulder flexion: AAROM with 1# bar x 10 holding 5 seconds, pt reporting increased pain with initial motion in arc 30-90 degrees. -Table slides: flexion and ER x 10 holding 5-10 seconds each -Scapular retraction holding 5 seconds x 10 in sitting    PATIENT EDUCATION: Education details: PT POC, HEP Person educated: Patient Education method: Explanation Education comprehension: verbalized understanding, returned demonstration, and verbal cues required   HOME EXERCISE PROGRAM: Access Code: [email or web address] URL: [email or web address] Date: 10/19/2021 Prepared by: Kearney Hard  Exercises Seated Shoulder Flexion Towel Slide at Table Top - 2-3 x daily - 7 x weekly - 1 sets - 10 reps - 5-10 seocnds hold Seated Shoulder External Rotation PROM on Table - 2-3 x daily - 7 x weekly - 1 sets - 10 reps - 5-10 seconds hold Seated Scapular Retraction - 2-3 x daily - 7 x weekly - 1 sets - 10 reps - 5-10 seconds hold   ASSESSMENT:  CLINICAL IMPRESSION: Patient is a 71 y.o. female who was seen today for physical therapy evaluation and treatment for right shoulder pain. Objective impairments include decreased mobility, decreased ROM, decreased strength, increased edema, impaired flexibility, impaired UE functional use, and pain.  These impairments are limiting patient from cleaning and community activity. Personal factors including 3+ comorbidities:   Arthritis Bowen's diseaseexcised 1992 Chronic low back pain Chronic radiation cystitis DDD (degenerative disc disease), cervical Family history of breast cancer Family history of lung cancer Family history of non-Hodgkin's lymphoma Family history of ovarian cancer Headache Hx of varicella Hydronephrosis of right kidney  are also affecting patient's functional outcome. Patient will benefit  from skilled PT to address above impairments and improve overall function.  REHAB POTENTIAL: fair, due to co-morbidities  CLINICAL DECISION MAKING: Evolving/moderate complexity  EVALUATION COMPLEXITY: Moderate   GOALS: Goals reviewed with patient? Yes  SHORT TERM GOALS:  STG Name Target Date Goal status  1 Pt will be independent with her initial HEP.   11/16/2021 INITIAL  2 Pt will be able to perform 5 time sit to stand with no UE support </= 15 seconds Baseline:  11/16/2021 INITIAL                            LONG TERM GOALS:   LTG Name Target Date Goal status  1 PT will be independent in her advanced HEP.  Baseline: 01/11/2022 INITIAL  2 Pt will improve FOTO score to >/= 63 % Baseline: 51% on 10/19/2021 01/11/2022 INITIAL  3 Pt will improve her right shoulder ER to >/= 30 degrees to improve functional mobility.  Baseline: 01/11/2022 INITIAL  4 Pt will be able to report pain of </= 2/10 with ADL's using her right UE.  Baseline: 01/11/2022 INITIAL                  PLAN: PT FREQUENCY: 1x/ week for 12 weeks  PT DURATION:  12 weeks  PLANNED INTERVENTIONS: Therapeutic exercises, Therapeutic activity, Neuro Muscular re-education, Balance training, Gait training, Patient/Family education, Joint mobilization, Dry Needling, Cryotherapy, Moist heat, Taping, Vasopneumatic device, and Manual therapy  PLAN FOR NEXT SESSION: shoulder ROM, mobs, PROM, gentle stretching and strengthening as tolerated   Oretha Caprice, PT MPT 10/19/2021, 2:41 PM    Referring diagnosis?M25.511,G89.29 (ICD-10-CM) - Chronic right shoulder pain  Treatment diagnosis? (if different than referring diagnosis) m25.611, m25.511, R60.0, R29.3, M62.81 What was this (referring dx) caused by? []  Surgery []  Fall [x]  Ongoing issue []  Arthritis []  Other: ____________  Laterality: [x]  Rt []  Lt []  Both  Check all possible CPT codes:  *CHOOSE 10 OR LESS*    [x]  97110 (Therapeutic Exercise)  []  92507 (SLP  Treatment)  [x]  97112 (Neuro Re-ed)   []  92526 (Swallowing Treatment)   [x]  97116 (Gait Training)   []  [id number] (Cognitive Training, 1st 15 minutes) [x]  97140 (Manual Therapy)   []  97130 (Cognitive Training, each add'l 15 minutes)  [x]  97530 (Therapeutic Activities)  []  Other, List CPT Code ____________    []  97535 (Self Care)       []  All codes above (97110 - 97535)  []  97012 (Mechanical Traction)  []  97014 (E-stim Unattended)  []  97032 (E-stim manual)  []  97033 (Ionto)  []  97035 (Ultrasound)  []  97760 (Orthotic Fit) []  97750 (Physical Performance Training) []  [id number] (Aquatic Therapy) []  97034 (Contrast Bath) []  [id number] (Paraffin) []  97597 (Wound Care 1st 20 sq cm) []  97598 (Wound Care each add'l 20 sq cm) []  97016 (Vasopneumatic Device) []  [phone] Comptroller) []  [id number] (Prosthetic Training)

## 2021-10-20 ENCOUNTER — Encounter: Payer: Self-pay | Admitting: Nurse Practitioner

## 2021-10-20 ENCOUNTER — Inpatient Hospital Stay: Payer: Medicare HMO

## 2021-10-20 ENCOUNTER — Encounter: Payer: Self-pay | Admitting: *Deleted

## 2021-10-20 ENCOUNTER — Inpatient Hospital Stay: Payer: Medicare HMO | Admitting: Nurse Practitioner

## 2021-10-20 ENCOUNTER — Inpatient Hospital Stay: Payer: Medicare HMO | Attending: Nurse Practitioner

## 2021-10-20 VITALS — BP 139/88 | HR 94 | Temp 98.1°F | Resp 20 | Ht 62.0 in | Wt 157.8 lb

## 2021-10-20 DIAGNOSIS — C772 Secondary and unspecified malignant neoplasm of intra-abdominal lymph nodes: Secondary | ICD-10-CM

## 2021-10-20 DIAGNOSIS — Z79899 Other long term (current) drug therapy: Secondary | ICD-10-CM | POA: Insufficient documentation

## 2021-10-20 DIAGNOSIS — C210 Malignant neoplasm of anus, unspecified: Secondary | ICD-10-CM | POA: Diagnosis not present

## 2021-10-20 DIAGNOSIS — Z5112 Encounter for antineoplastic immunotherapy: Secondary | ICD-10-CM | POA: Diagnosis not present

## 2021-10-20 LAB — CBC WITH DIFFERENTIAL (CANCER CENTER ONLY)
Abs Immature Granulocytes: 0.02 10*3/uL (ref 0.00–0.07)
Basophils Absolute: 0 10*3/uL (ref 0.0–0.1)
Basophils Relative: 1 %
Eosinophils Absolute: 0.1 10*3/uL (ref 0.0–0.5)
Eosinophils Relative: 2 %
HCT: 34.3 % — ABNORMAL LOW (ref 36.0–46.0)
Hemoglobin: 10.7 g/dL — ABNORMAL LOW (ref 12.0–15.0)
Immature Granulocytes: 0 %
Lymphocytes Relative: 10 %
Lymphs Abs: 0.6 10*3/uL — ABNORMAL LOW (ref 0.7–4.0)
MCH: 27.2 pg (ref 26.0–34.0)
MCHC: 31.2 g/dL (ref 30.0–36.0)
MCV: 87.1 fL (ref 80.0–100.0)
Monocytes Absolute: 0.5 10*3/uL (ref 0.1–1.0)
Monocytes Relative: 9 %
Neutro Abs: 4.3 10*3/uL (ref 1.7–7.7)
Neutrophils Relative %: 78 %
Platelet Count: 196 10*3/uL (ref 150–400)
RBC: 3.94 MIL/uL (ref 3.87–5.11)
RDW: 15.5 % (ref 11.5–15.5)
WBC Count: 5.5 10*3/uL (ref 4.0–10.5)
nRBC: 0 % (ref 0.0–0.2)

## 2021-10-20 LAB — CMP (CANCER CENTER ONLY)
ALT: 12 U/L (ref 0–44)
AST: 25 U/L (ref 15–41)
Albumin: 3.8 g/dL (ref 3.5–5.0)
Alkaline Phosphatase: 87 U/L (ref 38–126)
Anion gap: 9 (ref 5–15)
BUN: 37 mg/dL — ABNORMAL HIGH (ref 8–23)
CO2: 21 mmol/L — ABNORMAL LOW (ref 22–32)
Calcium: 9.2 mg/dL (ref 8.9–10.3)
Chloride: 102 mmol/L (ref 98–111)
Creatinine: 0.95 mg/dL (ref 0.44–1.00)
GFR, Estimated: >60 mL/min (ref >60)
Glucose, Bld: 87 mg/dL (ref 70–99)
Potassium: 4.6 mmol/L (ref 3.5–5.1)
Sodium: 132 mmol/L — ABNORMAL LOW (ref 135–145)
Total Bilirubin: 0.3 mg/dL (ref 0.3–1.2)
Total Protein: 7.4 g/dL (ref 6.5–8.1)

## 2021-10-20 MED ORDER — SODIUM CHLORIDE 0.9 % IV SOLN
240.0000 mg | Freq: Once | INTRAVENOUS | Status: AC
  Administered 2021-10-20: 240 mg via INTRAVENOUS
  Filled 2021-10-20: qty 24

## 2021-10-20 MED ORDER — SODIUM CHLORIDE 0.9 % IV SOLN: Freq: Once | INTRAVENOUS | Status: AC

## 2021-10-20 NOTE — Patient Instructions (Signed)
Ashley Moore   Discharge Instructions: Thank you for choosing [facility] to provide your oncology and hematology care.   If you have a lab appointment with the [facility], please go directly to the [facility] and check in at the registration area.   Wear comfortable clothing and clothing appropriate for easy access to any Portacath or PICC line.   We strive to give you quality time with your provider. You may need to reschedule your appointment if you arrive late (15 or more minutes).  Arriving late affects you and other patients whose appointments are after yours.  Also, if you miss three or more appointments without notifying the office, you may be dismissed from the clinic at the providers discretion.      For prescription refill requests, have your pharmacy contact our office and allow 72 hours for refills to be completed.    Today you received the following chemotherapy and/or immunotherapy agents Nivolumab (OPDIVO)      To help prevent nausea and vomiting after your treatment, we encourage you to take your nausea medication as directed.  BELOW ARE SYMPTOMS THAT SHOULD BE REPORTED IMMEDIATELY: *FEVER GREATER THAN 100.4 F (38 C) OR HIGHER *CHILLS OR SWEATING *NAUSEA AND VOMITING THAT IS NOT CONTROLLED WITH YOUR NAUSEA MEDICATION *UNUSUAL SHORTNESS OF BREATH *UNUSUAL BRUISING OR BLEEDING *URINARY PROBLEMS (pain or burning when urinating, or frequent urination) *BOWEL PROBLEMS (unusual diarrhea, constipation, pain near the anus) TENDERNESS IN MOUTH AND THROAT WITH OR WITHOUT PRESENCE OF ULCERS (sore throat, sores in mouth, or a toothache) UNUSUAL RASH, SWELLING OR PAIN  UNUSUAL VAGINAL DISCHARGE OR ITCHING   Items with * indicate a potential emergency and should be followed up as soon as possible or go to the Emergency Department if any problems should occur.  Please show the CHEMOTHERAPY ALERT CARD or IMMUNOTHERAPY ALERT CARD at  check-in to the Emergency Department and triage nurse.  Should you have questions after your visit or need to cancel or reschedule your appointment, please contact [facility]  Dept: [phone]  and follow the prompts.  Office hours are 8:00 a.m. to 4:30 p.m. Monday - Friday. Please note that voicemails left after 4:00 p.m. may not be returned until the following business day.  We are closed weekends and major holidays. You have access to a nurse at all times for urgent questions. Please call the main number to the clinic Dept: [phone] and follow the prompts.   For any non-urgent questions, you may also contact your provider using MyChart. We now offer e-Visits for anyone 21 and older to request care online for non-urgent symptoms. For details visit [email or web address].   Also download the MyChart app! Go to the app store, search "MyChart", open the app, select [facility], and log in with your MyChart username and password.  Due to Covid, a mask is required upon entering the hospital/clinic. If you do not have a mask, one will be given to you upon arrival. For doctor visits, patients may have 1 support person aged 53 or older with them. For treatment visits, patients cannot have anyone with them due to current Covid guidelines and our immunocompromised population.   Nivolumab injection What is this medication? NIVOLUMAB (nye VOL ue mab) is a monoclonal antibody. It treats certain types of cancer. Some of the cancers treated are colon cancer, head and neck cancer, Hodgkin lymphoma, lung cancer, and melanoma. This medicine may be used for other purposes;  ask your health care provider or pharmacist if you have questions. COMMON BRAND NAME(S): Opdivo What should I tell my care team before I take this medication? They need to know if you have any of these conditions: Autoimmune diseases such as Crohn's disease, ulcerative colitis, or lupus Have had or planning to  have an allogeneic stem cell transplant (uses someone else's stem cells) History of chest radiation Organ transplant Nervous system problems such as myasthenia gravis or Guillain-Barre syndrome An unusual or allergic reaction to nivolumab, other medicines, foods, dyes, or preservatives Pregnant or trying to get pregnant Breast-feeding How should I use this medication? This medication is injected into a vein. It is given in a hospital or clinic setting. A special MedGuide will be given to you before each treatment. Be sure to read this information carefully each time. Talk to your care team regarding the use of this medication in children. While it may be prescribed for children as young as 12 years for selected conditions, precautions do apply. Overdosage: If you think you have taken too much of this medicine contact a poison control center or emergency room at once. NOTE: This medicine is only for you. Do not share this medicine with others. What if I miss a dose? Keep appointments for follow-up doses. It is important not to miss your dose. Call your care team if you are unable to keep an appointment. What may interact with this medication? Interactions have not been studied. This list may not describe all possible interactions. Give your health care provider a list of all the medicines, herbs, non-prescription drugs, or dietary supplements you use. Also tell them if you smoke, drink alcohol, or use illegal drugs. Some items may interact with your medicine. What should I watch for while using this medication? Your condition will be monitored carefully while you are receiving this medication. You may need blood work done while you are taking this medication. Do not become pregnant while taking this medication or for 5 months after stopping it. Women should inform their care team if they wish to become pregnant or think they might be pregnant. There is a potential for serious harm to an unborn  child. Talk to your care team for more information. Do not breast-feed an infant while taking this medication or for 5 months after stopping it. What side effects may I notice from receiving this medication? Side effects that you should report to your care team as soon as possible: Allergic reactions--skin rash, itching, hives, swelling of the face, lips, tongue, or throat Bloody or black, tar-like stools Change in vision Chest pain Diarrhea Dry cough, shortness of breath or trouble breathing Eye pain Fast or irregular heartbeat Fever, chills High blood sugar (hyperglycemia)--increased thirst or amount of urine, unusual weakness or fatigue, blurry vision High thyroid levels (hyperthyroidism)--fast or irregular heartbeat, weight loss, excessive sweating or sensitivity to heat, tremors or shaking, anxiety, nervousness, irregular menstrual cycle or spotting Kidney injury--decrease in the amount of urine, swelling of the ankles, hands, or feet Liver injury--right upper belly pain, loss of appetite, nausea, light-colored stool, dark yellow or brown urine, yellowing skin or eyes, unusual weakness or fatigue Low red blood cell count--unusual weakness or fatigue, dizziness, headache, trouble breathing Low thyroid levels (hypothyroidism)--unusual weakness or fatigue, increased sensitivity to cold, constipation, hair loss, dry skin, weight gain, feelings of depression Mood and behavior changes-confusion, change in sex drive or performance, irritability Muscle pain or cramps Pain, tingling, or numbness in the hands or feet, muscle  weakness, trouble walking, loss of balance or coordination Red or dark brown urine Redness, blistering, peeling, or loosening of the skin, including inside the mouth Stomach pain Unusual bruising or bleeding Side effects that usually do not require medical attention (report to your care team if they continue or are bothersome): Bone pain Constipation Loss of  appetite Nausea Tiredness Vomiting This list may not describe all possible side effects. Call your doctor for medical advice about side effects. You may report side effects to FDA at 1-800-FDA-1088. Where should I keep my medication? This medication is given in a hospital or clinic and will not be stored at home. NOTE: This sheet is a summary. It may not cover all possible information. If you have questions about this medicine, talk to your doctor, pharmacist, or health care provider.  2022 Elsevier/Gold Standard (2021-05-25 00:00:00)

## 2021-10-20 NOTE — Progress Notes (Signed)
[place] OFFICE PROGRESS NOTE   Diagnosis: Anal cancer  INTERVAL HISTORY:   Ashley Moore returns as scheduled.  She completed another cycle of nivolumab 10/06/2021.  No rash or diarrhea.  She no longer feels lymph nodes at the left neck.  Cough has improved "a lot".  No shortness of breath.  No fever.  Objective:  Vital signs in last 24 hours:  Blood pressure 139/88, pulse 94, temperature 98.1 F (36.7 C), temperature source Oral, resp. rate 20, height 5\' 2"  (1.575 m), weight 157 lb 12.8 oz (71.6 kg), SpO2 98 %.    HEENT: No thrush or ulcers. Lymphatics: Question tiny left low neck lymph node. Resp: Faint rales at both lung bases.  No respiratory distress. Cardio: Regular rate and rhythm. GI: No hepatosplenomegaly. Vascular: Trace to 1+ lower leg edema bilaterally. Skin: No rash.   Lab Results:  Lab Results  Component Value Date   WBC 5.5 10/20/2021   HGB 10.7 (L) 10/20/2021   HCT 34.3 (L) 10/20/2021   MCV 87.1 10/20/2021   PLT 196 10/20/2021   NEUTROABS 4.3 10/20/2021    Imaging:  No results found.  Medications: I have reviewed the patient's current medications.  Assessment/Plan:  Anal cancer CT abdomen/pelvis 10/21/2016-thickening of the anus extending to the junction between the anus and rectum with a large stool ball in the rectum and mild fat stranding posterior to the rectum. Enlarged lymph nodes in the right and left inguinal regions. A few mildly prominent nodes seen posterior to the rectum. 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 PET scan 97/35/3299-[id number] hypermetabolic anal mass with hypermetabolic metastases to the groin region bilaterally, left pelvic sidewall and presacral space. Initiation of radiation and cycle 1 5-FU/mitomycin C 11/14/2016 Cycle 2 5-FU/mitomycin C 12/12/2016 (5-FU dose reduced due to mucositis, diarrhea, skin breakdown) Radiation completed 12/23/2016 CT abdomen/pelvis  04/14/2017-resolution of anal mass and bilateral inguinal lymphadenopathy. No residual tumor seen. CT abdomen/pelvis 01/28/2020-right hydroureteronephrosis, transition at the level of the mid right ureter, retroperitoneal lymphadenopathy CT renal stone study 01/28/2020-severe right hydronephrosis and proximal right hydroureter, 8 mm area of increased attenuation in the proximal right ureter-stone versus soft tissue mass PET 02/13/2020-hypermetabolic right retroperitoneal nodal metastases in the aortocaval and posterior pericaval chains, nonspecific mild anal wall hypermetabolism, right nephroureteral stent 02/21/2020 anorectal exam per Dr. Ashley Jacobs radiation changes of the skin around the anal margin.  Posterior midline smooth scarring.  Mildly stenotic.  Stenosis seems better.  No palpable concerning lesions.  Entire anal canal and distal rectum feels smooth and healthy.  Partial anoscopy performed with no lesions in the anal canal. Xeloda/radiation beginning 03/02/2020, completed 04/13/2020 CT abdomen/pelvis 06/17/2020-resolution of right retroperitoneal lymphadenopathy, no remaining pathologically enlarged lymph nodes, residual mild right hydronephrosis, no evidence of progressive disease, stable anal wall thickening Digital rectal exam by Dr. Quentin Cornwall 08/26/2020-posterior midline smooth scarring.  Mildly stenotic.  Stenosis seems better.  No palpable concerning lesions.  Anal canal feels smooth without palpable concern.  Unable to tolerate anoscopy.  Next visit in 6 months for surveillance. CT abdomen/pelvis 10/28/2020-no evidence of local recurrence or metastatic disease.  Stable mild rectal wall thickening.  New diffuse bladder wall thickening possibly related to prior radiation CT abdomen/pelvis 01/16/2021- recurrent right-sided hydronephrosis and proximal right hydroureter status post removal of right ureter stent, new soft tissue nodule along the course of the right ureter between the right psoas and  IVC suspicious for recurrent lymphadenopathy PET 02/12/2021-small focus of hypermetabolism at the anorectal junction without a definable  mass, right retroperitoneal nodal metastasis with obstruction of the proximal right ureter, right middle lobe hypermetabolic nodule, hypermetabolism in the right hilum without a defined nodal mass, hypermetabolic left supraclavicular and left axillary nodes 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 Cycle 1 Taxol/carboplatin 03/11/2021 Cycle 2 Taxol/carboplatin 03/31/2021, Fulphila Cycle 3 Taxol/carboplatin 04/21/2021, Fulphila CTs 05/11/2021-decrease size of FDG avid nodule in right middle lobe, right middle lobe subpleural nodule slightly enlarged, decreased in size of left axillary and subpectoral nodes, unchanged FDG avid right iliac nodes Cycle 4 Taxol/carboplatin 05/12/2021, G-CSF discontinued secondary to poor tolerance, Taxol and carboplatin dose reduced Cycle 5 Taxol/carboplatin 06/02/2021 Cycle 6 carboplatin 07/01/2021, Taxol held due to neuropathy Cycle 7 carboplatin 07/29/2021, Taxol held due to neuropathy CTs 08/16/2021-new liver lesion, increase in right psoas lymph node, unchanged right hydronephrosis, new mediastinal and right hilar lymphadenopathy, enlargement of bilateral pulmonary nodules and at least one new nodule, new subcarinal node Cycle 1 nivolumab 08/25/2021 Cycle 2 nivolumab 09/08/2021 Cycle 3 nivolumab 09/22/2021 Cycle 4 nivolumab 10/06/2021 Left labial lesions. Question direct extension from the anal cancer versus metastatic disease from anal cancer versus a separate malignant process. 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 Multiple family members with breast cancer. History of hypokalemia-likely secondary to decreased nutritional intake and diarrhea; potassium in normal range  01/16/2017. No longer taking a potassium supplement. Right hydroureteronephrosis on CT 01/28/2020, status post a cystoscopy/pyelogram 01/28/2020 confirming extrinsic compression of the right ureter, status post stent placement Right ureter stent exchange 06/12/2020 Right ureter stent removed 01/13/2021   8.  Admission 01/07/2021 with Klebsiella pneumoniae urosepsis/bacteremia  9.  Hospital admission 05/15/2021 with UTI/possible right pyelonephritis      Disposition: Ashley Moore appears well.  She has completed 4 treatments with nivolumab.  Performance status continues to be improved.  Previous palpable left neck nodes are smaller.  Plan to proceed with cycle 5 nivolumab today as scheduled.  CBC and chemistry panel reviewed.  Labs adequate to proceed as above.  The cough she had a few weeks ago continues to improve.  She will return for lab, follow-up, nivolumab in 2 weeks.  She will contact the office in the interim with any problems.    Ned Card ANP/GNP-BC   10/20/2021  1:53 PM

## 2021-10-20 NOTE — Progress Notes (Signed)
Patient presents for treatment. RN assessment completed along with the following:  Labs/vitals reviewed - Yes, and within treatment parameters.   Weight within 10% of previous measurement - Yes Informed consent completed and reflects current therapy/intent - Yes, on date 08/25/2021             Provider progress note reviewed - Yes, today's provider note was reviewed. Treatment/Antibody/Supportive plan reviewed - Yes, and there are no adjustments needed for today's treatment. S&H and other orders reviewed - Yes, and there are no additional orders identified. Previous treatment date reviewed - Yes, and the appropriate amount of time has elapsed between treatments. Clinic Hand Off Received from - Yes from [facility], RN  Patient to proceed with treatment.

## 2021-10-20 NOTE — Progress Notes (Signed)
Patient seen by Lisa Thomas NP today  Vitals are within treatment parameters.  Labs reviewed by Lisa Thomas NP and are within treatment parameters.  Per physician team, patient is ready for treatment and there are NO modifications to the treatment plan.     

## 2021-10-25 ENCOUNTER — Encounter: Payer: Self-pay | Admitting: Internal Medicine

## 2021-10-25 ENCOUNTER — Ambulatory Visit (INDEPENDENT_AMBULATORY_CARE_PROVIDER_SITE_OTHER): Payer: Medicare HMO | Admitting: Internal Medicine

## 2021-10-25 VITALS — BP 120/82 | Temp 97.5°F | Ht 62.0 in | Wt 155.0 lb

## 2021-10-25 DIAGNOSIS — C799 Secondary malignant neoplasm of unspecified site: Secondary | ICD-10-CM

## 2021-10-25 DIAGNOSIS — R0989 Other specified symptoms and signs involving the circulatory and respiratory systems: Secondary | ICD-10-CM

## 2021-10-25 DIAGNOSIS — C20 Malignant neoplasm of rectum: Secondary | ICD-10-CM

## 2021-10-25 DIAGNOSIS — R0982 Postnasal drip: Secondary | ICD-10-CM

## 2021-10-25 MED ORDER — AZELASTINE HCL 0.1 % NA SOLN: 2.0000 | Freq: Two times a day (BID) | NASAL | 1 refills | Status: DC

## 2021-10-25 NOTE — Patient Instructions (Signed)
Good to see you  Try  antihistamine nose spray for the runny nose  .  Use at least a week or so to see if  helps.  Saline is still ok . If bothersome we can get a ent to see you but exam is reassuring. Today .

## 2021-10-25 NOTE — Progress Notes (Signed)
Chief Complaint  Patient presents with   Sinus Problem    HPI: Ashley Moore 71 y.o. come in for post nasal drainage .  On and off for couple of months.  She describes this as big sludge down the back of her throat has been on Claritin-D for a while for bone pain Began with laryngitis a couple months ago saw Dr. Maudie Mercury virtually x2 initially doxycycline and some improvement.  She has some continued sinus or an drippy nose and also postnasal drainage but it does not keep her up at night.  Cough is not significant apparently getting better.  She is battling pain in her right shoulder is to have physical therapy and follow-up. Denies any new shortness of breath she has tried the saline or the other washes and not sure she is doing it right.   Under rx for metastatic cancer lymph nodes in the supraclavicular neck area have shrunk down recently Big sludge down throat   Claritin D>  Began with laryngitis and  saw dr Maudie Mercury x 2   ROS: See pertinent positives and negatives per HPI.  Denies true dysphagia.  But sometimes says it might be hard to swallow.  Again no nocturnal symptoms.  She is overall much better than she was a couple months ago.  Past Medical History:  Diagnosis Date   Arthritis    Bowen's disease    excised 1992   Chronic low back pain    Chronic radiation cystitis    DDD (degenerative disc disease), cervical    Family history of breast cancer    Family history of lung cancer    Family history of non-Hodgkin's lymphoma    Family history of ovarian cancer    Headache    Hx of varicella    Hydronephrosis of right kidney    urologist--- dr Tresa Moore,  malignant,  treated with ureter stent   Hypothyroidism    followed by pcp   Lower urinary tract symptoms (LUTS)    01-12-2021  per pt treated with accupuncture at dr Tresa Moore office   Lymphedema of both lower extremities    PICC (peripherally inserted central catheter) in place 01/11/2021   placed at [facility] in  [place], [place] for IV antibiotics   Positive blood culture 01/08/2021   Klebsiella Pneumaniae,  treated with daily IV antibiotic   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[facility]) oncologist--- dr Benay Spice   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,  invasive SCC , completed chemo/ radiation 12-23-2016;  recurrent metastatsis retroperitoneal lymphdenopathy,  completed radiation 04-13-2020 residual right ureteral uropathy obstruction   Retroperitoneal lymphadenopathy    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   Sepsis due to Klebsiella pneumoniae ([place]) 01/07/2021   pt admitted to [facility] in [place],  dx sepsis secondary to acute pyelonephritis with bacteremia , positive blood culture,  discharged 01-11-2021 home daily IV antibiotic   Wears glasses     Family History  Problem Relation Age of Onset   Lung cancer Mother        lung   Breast cancer Mother 19   Ovarian cancer Mother 46   Pancreatitis Father    Breast cancer Sister 24   Breast cancer Maternal Grandmother 82       breast    Breast cancer Maternal Aunt 75       breast   Melanoma Sister    Breast cancer Sister 47   Non-Hodgkin's lymphoma Paternal Grandmother     Social  History   Socioeconomic History   Marital status: Widowed    Spouse name: Not on file   Number of children: 0   Years of education: 14   Highest education level: Associate degree: academic program  Occupational History   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   Other Topics Concern   Not on file  Social History Narrative   H H  of 1      5 pets.   She is a former smoker   Retired Programmer, applications; Conservator, museum/gallery   [employer]   Red wine  1 per night.    Tea green tea and earl gray    Moved from [place] to [place] area in 12/15/83   1 pregnancy   Husband died spring  2016 cv   Sister died 2015-03-17  Bone cancer    3 remaining sisters            Social Determinants of Health   Financial Resource Strain: Low Risk    Difficulty of Paying Living Expenses: Not hard at all  Food Insecurity: No Food Insecurity   Worried About Charity fundraiser in the Last Year: Never true   Arboriculturist in the Last Year: Never true  Transportation Needs: No Transportation Needs   Lack of Transportation (Medical): No   Lack of Transportation (Non-Medical): No  Physical Activity: Sufficiently Active   Days of Exercise per Week: 5 days   Minutes of Exercise per Session: 30 min  Stress: No Stress Concern Present   Feeling of Stress : Not at all  Social Connections: Moderately Isolated   Frequency of Communication with Friends and Family: More than three times a week   Frequency of Social Gatherings with Friends and Family: More than three times a week   Attends Religious Services: 1 to 4 times per year   Active Member of [employer] or Organizations: No   Attends Archivist Meetings: Never   Marital Status: Widowed    Outpatient Medications Prior to Visit  Medication Sig Dispense Refill   acetaminophen (TYLENOL) 500 MG tablet Take 1,000 mg by mouth every 8 (eight) hours as needed for moderate pain.     Ascorbic Acid (VITAMIN C) 1000 MG tablet Take 1,000 mg by mouth daily.     Cholecalciferol (VITAMIN D3 PO) Take 2,000 Units by mouth daily.     conjugated estrogens (PREMARIN) vaginal cream Place 1 Applicatorful vaginally daily.     diclofenac Sodium (VOLTAREN) 1 % GEL Apply 2 g topically daily. To  shoulder     levothyroxine (SYNTHROID) 75 MCG tablet Take 1 tablet (75 mcg total) by mouth daily. 90 tablet 1   loratadine-pseudoephedrine (CLARITIN-D 12 HOUR) 5-120 MG tablet Take 1 tablet by mouth 2 (two) times daily.  LORazepam (ATIVAN) 0.5 MG tablet Take 0.5 mg by mouth as needed for anxiety.     MYRBETRIQ 50 MG TB24 tablet Take 50 mg by mouth daily.     Nutritional Supplements (BOOST PLUS PO) Take by mouth.     OVER THE COUNTER MEDICATION Place 1 drop into both eyes daily as needed (dry eyes).     polyethylene glycol (MIRALAX) 17 g packet Take 17 g by mouth daily as needed for mild constipation. (Patient taking differently: Take 17 g by mouth daily.)     Prenatal Vit-Fe Fumarate-FA (PRENATAL PO) Take by mouth daily.     benzonatate (TESSALON PERLES) 100 MG capsule 1-2 capsules up to twice daily as needed for cough (Patient not taking: Reported on 10/25/2021) 30 capsule 0   doxycycline (VIBRA-TABS) 100 MG tablet Take 1 tablet (100 mg total) by mouth 2 (two) times daily. (Patient not taking: Reported on 10/25/2021) 20 tablet 0   No facility-administered medications prior to visit.     EXAM:  BP 120/82 (BP Location: Left Arm, Patient Position: Sitting, Cuff Size: Normal)    Temp (!) 97.5 F (36.4 C) (Oral)    Ht 5\' 2"  (1.575 m)    Wt 155 lb (70.3 kg)    BMI 28.35 kg/m   Body mass index is 28.35 kg/m.  GENERAL: vitals reviewed and listed above, alert, oriented, appears well hydrated and in no acute distress HEENT: atraumatic, conjunctiva  clear, no obvious abnormalities on inspection of external nose and ears TMs are clear nares patent some mucoid discharge face plus minus tender OP : no lesion edema or exudate thick clear mucus postnasal drainage some cobblestoning airway clear NECK: no obvious masses on inspection left supraclavicular area shotty LUNGS: clear to auscultation bilaterally, no wheezes, rales or rhonchi, CV: HRRR, no clubbing cyanosis or  peripheral edema nl cap refill   MS: moves all extremities without noticeable focal  abnormality PSYCH: pleasant and cooperative, no obvious depression or anxiety Lab Results  Component Value Date   WBC 5.5 10/20/2021   HGB 10.7 (L) 10/20/2021   HCT 34.3 (L) 10/20/2021   PLT 196 10/20/2021   GLUCOSE 87 10/20/2021   CHOL 153 01/08/2020   TRIG 66.0 01/08/2020   HDL 76.30 01/08/2020   LDLCALC 63 01/08/2020   ALT 12 10/20/2021   AST 25 10/20/2021   NA 132 (L) 10/20/2021   K 4.6 10/20/2021   CL 102 10/20/2021   CREATININE 0.95 10/20/2021   BUN 37 (H) 10/20/2021   CO2 21 (L) 10/20/2021   TSH 3.715 09/22/2021   INR 1.2 05/16/2021   HGBA1C 5.5 05/11/2015   BP Readings from Last 3 Encounters:  10/25/21 120/82  10/20/21 139/88  10/06/21 137/80    ASSESSMENT AND PLAN:  Discussed the following assessment and plan:  Runny nose  Post-nasal drainage  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  Can try Astelin for the runny nose with instructions about how to use the nose spray use it for 1 to 2 weeks to see if it is helpful realizing it is not a cure but a management. If alarm symptoms plan follow-up let us know how things are doing in 1 to 2 weeks. Chest is clear to auscultation today.  Uncertain how much of her symptoms are reactive to the environment after infection i.e.  postinfectious. -Patient advised to return or notify health care team  if  new concerns arise.  Patient Instructions  Good to see you  Try  antihistamine nose spray for the runny nose  .  Use at least a week or so to see if  helps.  Saline is still ok . If bothersome we can get a ent to see you but exam is reassuring. Today .     Standley Brooking. Fronie Holstein M.D.

## 2021-10-26 ENCOUNTER — Ambulatory Visit: Payer: Medicare HMO | Admitting: Physical Therapy

## 2021-10-26 ENCOUNTER — Encounter: Payer: Self-pay | Admitting: Physical Therapy

## 2021-10-26 ENCOUNTER — Other Ambulatory Visit: Payer: Self-pay

## 2021-10-26 DIAGNOSIS — M25611 Stiffness of right shoulder, not elsewhere classified: Secondary | ICD-10-CM

## 2021-10-26 DIAGNOSIS — R293 Abnormal posture: Secondary | ICD-10-CM | POA: Diagnosis not present

## 2021-10-26 DIAGNOSIS — M6281 Muscle weakness (generalized): Secondary | ICD-10-CM

## 2021-10-26 DIAGNOSIS — M25511 Pain in right shoulder: Secondary | ICD-10-CM | POA: Diagnosis not present

## 2021-10-26 DIAGNOSIS — R600 Localized edema: Secondary | ICD-10-CM | POA: Diagnosis not present

## 2021-10-26 DIAGNOSIS — G8929 Other chronic pain: Secondary | ICD-10-CM | POA: Diagnosis not present

## 2021-10-26 NOTE — Therapy (Signed)
OUTPATIENT PHYSICAL THERAPY TREATMENT NOTE   Patient Name: Ashley Moore MRN: [id number] DOB:1950-10-15, 71 y.o., female Today's Date: 10/26/2021  PCP: [name], MD REFERRING PROVIDER: Persons, [name], [place]   PT End of Session - 10/26/21 1109     Visit Number 2    Number of Visits 12    Date for PT Re-Evaluation 01/11/22    Authorization Type [employer] 12 visits applied for on 10/19/2021    Progress Note Due on Visit 10    PT Start Time 1058    PT Stop Time 7902    PT Time Calculation (min) 46 min    Activity Tolerance Patient tolerated treatment well    Behavior During Therapy WFL for tasks assessed/performed             Past Medical History:  Diagnosis Date   Arthritis    Bowen's disease    excised 1992   Chronic low back pain    Chronic radiation cystitis    DDD (degenerative disc disease), cervical    Family history of breast cancer    Family history of lung cancer    Family history of non-Hodgkin's lymphoma    Family history of ovarian cancer    Headache    Hx of varicella    Hydronephrosis of right kidney    urologist--- dr Tresa Moore,  malignant,  treated with ureter stent   Hypothyroidism    followed by pcp   Lower urinary tract symptoms (LUTS)    01-12-2021  per pt treated with accupuncture at dr Tresa Moore office   Lymphedema of both lower extremities    PICC (peripherally inserted central catheter) in place 01/11/2021   placed at [facility] in [place], [place] for IV antibiotics   Positive blood culture 01/08/2021   Klebsiella Pneumaniae,  treated with daily IV antibiotic   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[facility]) oncologist--- dr Benay Spice   dx 02/ 2018,  invasive SCC , completed chemo/ radiation 12-23-2016;  recurrent metastatsis retroperitoneal lymphdenopathy,  completed radiation 04-13-2020 residual right ureteral uropathy obstruction   Retroperitoneal lymphadenopathy    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   Sepsis due to  Klebsiella pneumoniae ([place]) 01/07/2021   pt admitted to [facility] in [place],  dx sepsis secondary to acute pyelonephritis with bacteremia , positive blood culture,  discharged 01-11-2021 home daily IV antibiotic   Wears glasses    Past Surgical History:  Procedure Laterality Date   BUNIONECTOMY Right yrs ago   [place] Right 06/12/2020   Procedure: CYSTOSCOPY WITH RETROGRADE PYELOGRAM/URETERAL STENT EXCHANGE;  Surgeon: Alexis Frock, MD;  Location: [facility];  Service: Urology;  Laterality: Right;   CYSTOSCOPY W/ URETERAL STENT PLACEMENT Right 01/13/2021   Procedure: CYSTOSCOPY WITH RETROGRADE PYELOGRAM/URETERAL STENT REMOVALRIGHT;  Surgeon: Alexis Frock, MD;  Location: [facility];  Service: Urology;  Laterality: Right;  [place], URETEROSCOPY AND STENT PLACEMENT Right 01/28/2020   Procedure: CYSTOSCOPY WITH RETROGRADE PYELOGRAM, URETEROSCOPY AND STENT PLACEMENT;  Surgeon: Alexis Frock, MD;  [facility];  Service: Urology;  Laterality: Right;   IR GENERIC HISTORICAL  11/14/2016   IR US GUIDE VASC ACCESS RIGHT 11/14/2016 [facility]   IR GENERIC HISTORICAL  11/14/2016   IR FLUORO GUIDE CV LINE RIGHT 11/14/2016 [facility]   IR GENERIC HISTORICAL  12/12/2016   IR US GUIDE VASC ACCESS RIGHT 12/12/2016 [name], MD [facility]  IR GENERIC HISTORICAL  12/12/2016   IR FLUORO GUIDE CV LINE RIGHT 12/12/2016 [name], MD [facility]   SKIN CANCER EXCISION  1990   bowens disease   Patient Active Problem List   Diagnosis Date Noted   Atypical chest pain 06/29/2021   Intractable abdominal pain 05/16/2021   Acute pyelonephritis 05/16/2021   Acute cystitis without hematuria 05/16/2021   Hydronephrosis of right kidney 05/16/2021   Obstructive uropathy 05/16/2021   Hypothyroidism 05/16/2021   Hyponatremia 05/16/2021   Obesity (BMI 30-39.9) 05/16/2021   Goals of  care, counseling/discussion 03/01/2021   SIRS (systemic inflammatory response syndrome) ([place]) 01/13/2021   Pyelonephritis 01/13/2021   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 ([place]) 02/18/2020   Genetic testing 01/27/2020   Family history of breast cancer    Family history of lung cancer    Family history of ovarian cancer    Family history of non-Hodgkin's lymphoma    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   Port catheter in place 11/21/2016   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   Anal cancer ([place]) 11/03/2016   Cystitis 05/06/2015   Degenerative cervical disc 04/08/2015   Trigger point of neck 03/26/2015   Pain in joint, ankle and foot 04/23/2013   Visit for preventive health examination 02/13/2013   Routine gynecological examination 02/13/2013   Family history of breast cancer in first degree relative 02/13/2013   Rash and nonspecific skin eruption 02/13/2013   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   VITAMIN D DEFICIENCY 06/25/2008   NECK PAIN 06/02/2008   FOOT PAIN 06/02/2008   Osteopenia 06/02/2008    REFERRING DIAG: M25.511,G89.29 (ICD-10-CM) - Chronic right shoulder pain   THERAPY DIAG:  Stiffness of right shoulder, not elsewhere classified  Localized edema  Muscle weakness (generalized)  Chronic right shoulder pain  Abnormal posture  PERTINENT HISTORY: Arthritis Bowen's diseaseexcised 1992 Chronic low back pain Chronic radiation cystitis DDD (degenerative disc disease), cervical Family history of breast cancer Family history of lung cancer Family history of non-Hodgkin's lymphoma Family history of ovarian cancer Headache Hx of varicella Hydronephrosis of right kidney rectal cancer   PRECAUTIONS: Other: infusion therapy for Cancer , fall precuations   SUBJECTIVE: Pt arriving today reporting 7/10 pain in her left shoulder. Pt reporting pain following her infusion treatment.   PAIN:  Are you having pain? Yes NPRS scale: 7/10 Pain location: anterior  shoulder Pain orientation: Right  PAIN TYPE: aching Pain description: constant  Aggravating factors: reaching upward Relieving factors: resting and heat    OBJECTIVE:      PATIENT SURVEYS:  FOTO: 51% on 10/19/2021  UPPER EXTREMITY AROM/PROM:   Active ROM  In supine Right 10/19/2021 Left 10/19/2021  Shoulder flexion 120 164  Shoulder extension 30 45    Shoulder abduction 98 160  Shoulder adduction      Shoulder internal rotation 75 shoulder abd 45 degrees 75  Shoulder external rotation 15 Shoulder abd 45 degrees 80  Elbow flexion 140   145  Elbow extension 0 0  Wrist flexion      Wrist extension      Wrist ulnar deviation      Wrist radial deviation      Wrist pronation      Wrist supination      (Blank rows = not tested)   Passive ROM Right 10/19/2021 Left 10/19/2021  Shoulder flexion 130    Shoulder extension 35    Shoulder abduction 105    Shoulder adduction      Shoulder internal  rotation 80 shoulder abd 45 degrees    Shoulder external rotation 20 Shoulder abd 45 degrees    Elbow flexion      Elbow extension      Wrist flexion      Wrist extension      Wrist ulnar deviation      Wrist radial deviation      Wrist pronation      Wrist supination          UPPER EXTREMITY MMT:   MMT Right 10/19/2021 Left 10/19/2021  Shoulder flexion 2+/5 4+/5  Shoulder extension 2+/5 4+/5  Shoulder abduction 2+/5 4+/5  Shoulder adduction      Shoulder internal rotation 2+/5 4+/5  Shoulder external rotation 2+/5 4+/5  Middle trapezius      Lower trapezius      Elbow flexion      Elbow extension      Wrist flexion      Wrist extension      Wrist ulnar deviation      Wrist radial deviation      Wrist pronation      Wrist supination      Grip strength (lbs) 31.8 pounds 48 pounds    (Blank rows = not tested)                  TODAY'S TREATMENT:    10/26/21  Therapeutic Exercises:   -Pulleys: abduction 2 minutes, flexion x 2 minutes -table slides:  ER and  scaption x 15 each holding 5-10 seconds -AAROM: with 1# bar flexion and ER x 15 holding 5 seconds at end range to pt's tolerance.  Manual:  PROM to right shoulder Grade 2-3 AP GH shoulder mobs  Modalities:  Moist Heat: 5 minutes to right shoulder      10/19/2021: Therapeutic Exercises:  -Supine shoulder flexion: AAROM with 1# bar x 10 holding 5 seconds, pt reporting increased pain with initial motion in arc 30-90 degrees. -Table slides: flexion and ER x 10 holding 5-10 seconds each -Scapular retraction holding 5 seconds x 10 in sitting       PATIENT EDUCATION: Education details: reviewed HEP Person educated: Patient Education method: Explanation Education comprehension: verbalized understanding, returned demonstration, and verbal cues required     HOME EXERCISE PROGRAM: Access Code: [email or web address] URL: [email or web address] Date: 10/19/2021 Prepared by: Kearney Hard   Exercises Seated Shoulder Flexion Towel Slide at Table Top - 2-3 x daily - 7 x weekly - 1 sets - 10 reps - 5-10 seocnds hold Seated Shoulder External Rotation PROM on Table - 2-3 x daily - 7 x weekly - 1 sets - 10 reps - 5-10 seconds hold Seated Scapular Retraction - 2-3 x daily - 7 x weekly - 1 sets - 10 reps - 5-10 seconds hold     ASSESSMENT:   CLINICAL IMPRESSION: 10/26/2021:  Pt with limited tolerance to exercises due to increased pain in her right shoulder of 7-8/10. Pt reporting her pain has increased due to tolerance of her infusion treatments. Pt reporting pain more with end ranges. Pt with good tolerance to manual therapy. Pt's AAROM of her right shoulder flexion was 135 degrees. Continue skilled PT to maximize pt's function.   10/19/2021:  Patient is a 71 y.o. female who was seen today for physical therapy evaluation and treatment for right shoulder pain. Objective impairments include decreased mobility, decreased ROM, decreased strength, increased edema, impaired flexibility, impaired  UE functional use, and pain.  These impairments are limiting patient from cleaning  and community activity. Personal factors including 3+ comorbidities:   Arthritis Bowen's diseaseexcised 1992 Chronic low back pain Chronic radiation cystitis DDD (degenerative disc disease), cervical Family history of breast cancer Family history of lung cancer Family history of non-Hodgkin's lymphoma Family history of ovarian cancer Headache Hx of varicella Hydronephrosis of right kidney  are also affecting patient's functional outcome. Patient will benefit from skilled PT to address above impairments and improve overall function.   REHAB POTENTIAL: fair, due to co-morbidities   CLINICAL DECISION MAKING: Evolving/moderate complexity   EVALUATION COMPLEXITY: Moderate     GOALS: Goals reviewed with patient? Yes   SHORT TERM GOALS:   STG Name Target Date Goal status  1 Pt will be independent with her initial HEP.    11/16/2021 INITIAL  2 Pt will be able to perform 5 time sit to stand with no UE support </= 15 seconds Baseline:  11/16/2021 INITIAL                                                 LONG TERM GOALS:    LTG Name Target Date Goal status  1 PT will be independent in her advanced HEP.  Baseline: 01/11/2022 INITIAL  2 Pt will improve FOTO score to >/= 63 % Baseline: 51% on 10/19/2021 01/11/2022 INITIAL  3 Pt will improve her right shoulder ER to >/= 30 degrees to improve functional mobility.  Baseline: 01/11/2022 INITIAL  4 Pt will be able to report pain of </= 2/10 with ADL's using her right UE.  Baseline: 01/11/2022 INITIAL                               PLAN: PT FREQUENCY: 1x/ week for 12 weeks   PT DURATION: 12 weeks   PLANNED INTERVENTIONS: Therapeutic exercises, Therapeutic activity, Neuro Muscular re-education, Balance training, Gait training, Patient/Family education, Joint mobilization, Dry Needling, Cryotherapy, Moist heat, Taping, Vasopneumatic device, and Manual therapy   PLAN FOR  NEXT SESSION:  shoulder ROM, mobs, PROM, gentle stretching and strengthening as tolerated      Oretha Caprice, PT, MPT 10/26/2021, 11:46 AM

## 2021-10-28 ENCOUNTER — Ambulatory Visit: Payer: Medicare HMO | Admitting: Orthopaedic Surgery

## 2021-10-28 ENCOUNTER — Encounter: Payer: Self-pay | Admitting: Orthopaedic Surgery

## 2021-10-28 ENCOUNTER — Other Ambulatory Visit: Payer: Self-pay

## 2021-10-28 DIAGNOSIS — M19011 Primary osteoarthritis, right shoulder: Secondary | ICD-10-CM

## 2021-10-28 MED ORDER — METHYLPREDNISOLONE ACETATE 40 MG/ML IJ SUSP
40.0000 mg | INTRAMUSCULAR | Status: AC | PRN
  Administered 2021-10-28: 40 mg via INTRA_ARTICULAR

## 2021-10-28 MED ORDER — BUPIVACAINE HCL 0.25 % IJ SOLN
2.0000 mL | INTRAMUSCULAR | Status: AC | PRN
  Administered 2021-10-28: 2 mL via INTRA_ARTICULAR

## 2021-10-28 MED ORDER — LIDOCAINE HCL 1 % IJ SOLN
2.0000 mL | INTRAMUSCULAR | Status: AC | PRN
  Administered 2021-10-28: 2 mL

## 2021-10-28 NOTE — Progress Notes (Signed)
Office Visit Note   Patient: Ashley Moore           Date of Birth: 1951/08/18           MRN: [id number] Visit Date: 10/28/2021              Requested by: Ashley Medin, MD [place],  [place] [id number] PCP: Ashley Medin, MD   Assessment & Plan: Visit Diagnoses:  1. Primary osteoarthritis, right shoulder     Plan: Ashley Moore is osteoarthritis of her right shoulder films demonstrate significant narrowing of the glenohumeral joint associated with a large inferior humeral head spur and subchondral cyst formation throughout the entire humeral head.  I do not see any superior migration so I am not sure the integrity of the rotator cuff but this may be primary osteoarthritis.  We had discussed cortisone injection in the past as a temporizing procedure she would like to proceed.  She is fully aware that definitive procedure would be shoulder replacement.  She need a CT scan before we determine whether to reverse or a primary shoulder.  She is aware of the above but wants to proceed with a cortisone  Follow-Up Instructions: Return if symptoms worsen or fail to improve.   Orders:  No orders of the defined types were placed in this encounter.  No orders of the defined types were placed in this encounter.     Procedures: Large Joint Inj: R glenohumeral on 10/28/2021 2:53 PM Indications: pain and diagnostic evaluation Details: 25 G 1.5 in needle, posterior approach  Arthrogram: No  Medications: 2 mL lidocaine 1 %; 40 mg methylPREDNISolone acetate 40 MG/ML; 2 mL bupivacaine 0.25 % Consent was given by the patient. Immediately prior to procedure a time out was called to verify the correct patient, procedure, equipment, support staff and site/side marked as required. Patient was prepped and draped in the usual sterile fashion.      Clinical Data: No additional findings.   Subjective: Chief Complaint  Patient presents with   Right Shoulder - Follow-up   Patient presents today for a month follow up on her right shoulder. She saw [facility] a month [facility]. She has been going to physical therapy once weekly. She does feel like there has been some improvement. Limited range of motion. She takes Ashley Moore for pain relief. No prior shoulder surgery. She is left hand dominant.  She is in the midst of immunotherapy for her breast cancer  HPI  Review of Systems   Objective: Vital Signs: There were no vitals taken for this visit.  Physical Exam Constitutional:      Appearance: She is well-developed.  Eyes:     Pupils: Pupils are equal, round, and reactive to light.  Pulmonary:     Effort: Pulmonary effort is normal.  Skin:    General: Skin is warm and dry.  Neurological:     Mental Status: She is alert and oriented to person, place, and time.  Psychiatric:        Behavior: Behavior normal.    Ortho Exam right shoulder with positive crepitation with internal and external rotation.  There is obvious loss of motion based on her arthritis.  About 80 degrees to 90 degrees of flexion and limitation of internal or external rotation.  Good strength.  Good grip.  No pain with her arm at her side either but palpation or with movement  Specialty Comments:  No specialty comments available.  Imaging: No results  found.   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   Genetic testing 01/27/2020   Family history of breast cancer    Family history of lung cancer    Family history of ovarian cancer    Family history of non-Hodgkin's lymphoma    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   Port catheter in place 11/21/2016   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   Anal cancer ([place]) 11/03/2016   Cystitis 05/06/2015   Degenerative cervical disc 04/08/2015   Trigger point of neck 03/26/2015   Pain in joint, ankle and foot 04/23/2013   Visit for preventive health examination 02/13/2013   Routine gynecological examination 02/13/2013   Family history of breast cancer in first degree relative 02/13/2013   Rash and nonspecific skin eruption 02/13/2013   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   VITAMIN D DEFICIENCY 06/25/2008   NECK PAIN 06/02/2008   FOOT PAIN 06/02/2008   Osteopenia 06/02/2008   Past Medical History:  Diagnosis Date   Arthritis    Bowen's disease    excised 1992   Chronic low back pain    Chronic radiation cystitis    DDD (degenerative disc disease), cervical    Family history of breast cancer    Family history of lung cancer    Family history of non-Hodgkin's lymphoma    Family history of ovarian cancer    Headache    Hx of varicella    Hydronephrosis of right kidney    urologist--- dr Tresa Moore,  malignant,  treated with ureter stent   Hypothyroidism    followed by pcp   Lower urinary tract symptoms (LUTS)    01-12-2021  per pt treated with accupuncture at dr Tresa Moore office   Lymphedema of both lower extremities    PICC (peripherally inserted central catheter) in place 01/11/2021   placed at [facility] in [place], [place] for IV antibiotics   Positive blood culture 01/08/2021   Klebsiella Pneumaniae,  treated with daily IV antibiotic   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[facility]) oncologist--- dr Benay Spice   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,  invasive SCC , completed chemo/ radiation 12-23-2016;  recurrent metastatsis retroperitoneal lymphdenopathy,   completed radiation 04-13-2020 residual right ureteral uropathy obstruction   Retroperitoneal lymphadenopathy    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   Sepsis due to Klebsiella pneumoniae ([place]) 01/07/2021   pt admitted to [facility] in [place],  dx sepsis secondary to acute pyelonephritis with bacteremia , positive blood culture,  discharged 01-11-2021 home daily IV antibiotic   Wears glasses     Family History  Problem Relation Age of Onset   Lung cancer Mother        lung   Breast cancer Mother 65   Ovarian cancer Mother 18   Pancreatitis Father    Breast cancer Sister 57   Breast cancer Maternal Grandmother 82       breast    Breast cancer Maternal Aunt 62  breast   Melanoma Sister    Breast cancer Sister 57   Non-Hodgkin's lymphoma Paternal Grandmother     Past Surgical History:  Procedure Laterality Date   BUNIONECTOMY Right yrs ago   CYSTOSCOPY W/ URETERAL STENT PLACEMENT Right 06/12/2020   Procedure: CYSTOSCOPY WITH RETROGRADE PYELOGRAM/URETERAL STENT EXCHANGE;  Surgeon: Alexis Frock, MD;  Location: [facility];  Service: Urology;  Laterality: Right;   CYSTOSCOPY W/ URETERAL STENT PLACEMENT Right 01/13/2021   Procedure: CYSTOSCOPY WITH RETROGRADE PYELOGRAM/URETERAL STENT REMOVALRIGHT;  Surgeon: Alexis Frock, MD;  [facility];  Service: Urology;  Laterality: Right;  [place], URETEROSCOPY AND STENT PLACEMENT Right 01/28/2020   Procedure: CYSTOSCOPY WITH RETROGRADE PYELOGRAM, URETEROSCOPY AND STENT PLACEMENT;  Surgeon: Alexis Frock, MD;  [facility];  Service: Urology;  Laterality: Right;   IR GENERIC HISTORICAL  11/14/2016   IR US GUIDE VASC ACCESS RIGHT 11/14/2016 [facility]   IR GENERIC HISTORICAL  11/14/2016   IR FLUORO GUIDE CV LINE RIGHT 11/14/2016 [facility]   IR GENERIC HISTORICAL  12/12/2016   IR US GUIDE VASC ACCESS RIGHT  12/12/2016 [name], MD [facility]   IR GENERIC HISTORICAL  12/12/2016   IR FLUORO GUIDE CV LINE RIGHT 12/12/2016 [name], MD [facility]   SKIN CANCER EXCISION  1990   bowens disease   Social History   Occupational History   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

## 2021-10-31 ENCOUNTER — Other Ambulatory Visit: Payer: Self-pay | Admitting: Oncology

## 2021-11-01 ENCOUNTER — Encounter: Payer: Self-pay | Admitting: Physical Therapy

## 2021-11-01 ENCOUNTER — Ambulatory Visit: Payer: Medicare HMO | Admitting: Physical Therapy

## 2021-11-01 ENCOUNTER — Other Ambulatory Visit: Payer: Self-pay

## 2021-11-01 DIAGNOSIS — M25511 Pain in right shoulder: Secondary | ICD-10-CM | POA: Diagnosis not present

## 2021-11-01 DIAGNOSIS — G8929 Other chronic pain: Secondary | ICD-10-CM

## 2021-11-01 DIAGNOSIS — M6281 Muscle weakness (generalized): Secondary | ICD-10-CM

## 2021-11-01 DIAGNOSIS — R600 Localized edema: Secondary | ICD-10-CM | POA: Diagnosis not present

## 2021-11-01 DIAGNOSIS — M25611 Stiffness of right shoulder, not elsewhere classified: Secondary | ICD-10-CM

## 2021-11-01 DIAGNOSIS — R293 Abnormal posture: Secondary | ICD-10-CM | POA: Diagnosis not present

## 2021-11-01 NOTE — Therapy (Signed)
OUTPATIENT PHYSICAL THERAPY TREATMENT NOTE   Patient Name: Ashley Moore MRN: [id number] DOB:1951-08-27, 71 y.o., female Today's Date: 11/01/2021  PCP: [name], MD REFERRING PROVIDER: Newt Minion, MD   PT End of Session - 11/01/21 1516     Visit Number 3    Number of Visits 12    Date for PT Re-Evaluation 01/11/22    Authorization Type [employer] 12 visits applied for on 10/19/2021    PT Start Time 1515    PT Stop Time 1555    PT Time Calculation (min) 40 min    Activity Tolerance Patient tolerated treatment well             Past Medical History:  Diagnosis Date   Arthritis    Bowen's disease    excised 1992   Chronic low back pain    Chronic radiation cystitis    DDD (degenerative disc disease), cervical    Family history of breast cancer    Family history of lung cancer    Family history of non-Hodgkin's lymphoma    Family history of ovarian cancer    Headache    Hx of varicella    Hydronephrosis of right kidney    urologist--- dr Tresa Moore,  malignant,  treated with ureter stent   Hypothyroidism    followed by pcp   Lower urinary tract symptoms (LUTS)    01-12-2021  per pt treated with accupuncture at dr Tresa Moore office   Lymphedema of both lower extremities    PICC (peripherally inserted central catheter) in place 01/11/2021   placed at [facility] in [place], [place] for IV antibiotics   Positive blood culture 01/08/2021   Klebsiella Pneumaniae,  treated with daily IV antibiotic   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[facility]) oncologist--- dr Benay Spice   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,  invasive SCC , completed chemo/ radiation 12-23-2016;  recurrent metastatsis retroperitoneal lymphdenopathy,  completed radiation 04-13-2020 residual right ureteral uropathy obstruction   Retroperitoneal lymphadenopathy    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   Sepsis due to Klebsiella pneumoniae ([place]) 01/07/2021   pt admitted to [facility] in [place],  dx  sepsis secondary to acute pyelonephritis with bacteremia , positive blood culture,  discharged 01-11-2021 home daily IV antibiotic   Wears glasses    Past Surgical History:  Procedure Laterality Date   BUNIONECTOMY Right yrs ago   Joice Right 06/12/2020   Procedure: CYSTOSCOPY WITH RETROGRADE PYELOGRAM/URETERAL STENT EXCHANGE;  Surgeon: Alexis Frock, MD;  Location: [facility];  Service: Urology;  Laterality: Right;   CYSTOSCOPY W/ URETERAL STENT PLACEMENT Right 01/13/2021   Procedure: CYSTOSCOPY WITH RETROGRADE PYELOGRAM/URETERAL STENT REMOVALRIGHT;  Surgeon: Alexis Frock, MD;  Location: [facility];  Service: Urology;  Laterality: Right;  Gleason, URETEROSCOPY AND STENT PLACEMENT Right 01/28/2020   Procedure: CYSTOSCOPY WITH RETROGRADE PYELOGRAM, URETEROSCOPY AND STENT PLACEMENT;  Surgeon: Alexis Frock, MD;  Location: [facility];  Service: Urology;  Laterality: Right;   IR GENERIC HISTORICAL  11/14/2016   IR US GUIDE VASC ACCESS RIGHT 11/14/2016 [facility]   IR GENERIC HISTORICAL  11/14/2016   IR FLUORO GUIDE CV LINE RIGHT 11/14/2016 [facility]   IR GENERIC HISTORICAL  12/12/2016   IR US GUIDE VASC ACCESS RIGHT 12/12/2016 [name], MD [facility]   IR GENERIC HISTORICAL  12/12/2016   IR FLUORO GUIDE CV LINE RIGHT 12/12/2016 [name], MD [facility]   SKIN CANCER EXCISION  1990   bowens disease   Patient Active Problem List   Diagnosis Date Noted   Primary osteoarthritis, 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/28/2021   Atypical chest pain 06/29/2021   Intractable abdominal pain 05/16/2021   Acute pyelonephritis 05/16/2021   Acute cystitis without hematuria 05/16/2021   Hydronephrosis of right kidney 05/16/2021   Obstructive uropathy 05/16/2021   Hypothyroidism 05/16/2021   Hyponatremia 05/16/2021   Obesity (BMI 30-39.9) 05/16/2021   Goals of care, counseling/discussion 03/01/2021   SIRS  (systemic inflammatory response syndrome) ([place]) 01/13/2021   Pyelonephritis 01/13/2021   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   Genetic testing 01/27/2020   Family history of breast cancer    Family history of lung cancer    Family history of ovarian cancer    Family history of non-Hodgkin's lymphoma    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   Port catheter in place 11/21/2016   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   Anal cancer ([place]) 11/03/2016   Cystitis 05/06/2015   Degenerative cervical disc 04/08/2015   Trigger point of neck 03/26/2015   Pain in joint, ankle and foot 04/23/2013   Visit for preventive health examination 02/13/2013   Routine gynecological examination 02/13/2013   Family history of breast cancer in first degree relative 02/13/2013   Rash and nonspecific skin eruption 02/13/2013   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   VITAMIN D DEFICIENCY 06/25/2008   NECK PAIN 06/02/2008   FOOT PAIN 06/02/2008   Osteopenia 06/02/2008    REFERRING DIAG: M25.511,G89.29 (ICD-10-CM) - Chronic right shoulder pain   THERAPY DIAG:  Stiffness of right shoulder, not elsewhere classified  Localized edema  Muscle weakness (generalized)  Chronic right shoulder pain  Abnormal posture  PERTINENT HISTORY: Arthritis Bowen's diseaseexcised 1992 Chronic low back pain Chronic radiation cystitis DDD (degenerative disc disease), cervical Family history of breast cancer Family history of lung cancer Family history of non-Hodgkin's lymphoma Family history of ovarian cancer Headache Hx of varicella Hydronephrosis of right kidney rectal cancer   PRECAUTIONS: Other: infusion therapy for Cancer , fall precuations   SUBJECTIVE: Pt arriving today reporting 4/10 pain in her left shoulder. Pt also reporting her MD is going to inject her knee.   PAIN:  Are you having pain? Yes NPRS scale: 4/10 Pain location: anterior shoulder Pain orientation: Right  PAIN TYPE:  aching Pain description: constant  Aggravating factors: reaching upward Relieving factors: resting and heat    OBJECTIVE:      PATIENT SURVEYS:  FOTO: 51% on 10/19/2021  UPPER EXTREMITY AROM/PROM:   Active ROM  In supine Right 10/19/2021 Left 10/19/2021  Shoulder flexion 120 164  Shoulder extension 30 45    Shoulder abduction 98 160  Shoulder adduction      Shoulder internal rotation 75 shoulder abd 45 degrees 75  Shoulder external rotation 15 Shoulder abd 45 degrees 80  Elbow flexion 140   145  Elbow extension 0 0  Wrist flexion      Wrist extension      Wrist ulnar deviation      Wrist radial deviation      Wrist pronation      Wrist supination      (Blank rows = not tested)   Passive ROM Right 10/19/2021 Left 10/19/2021  Shoulder flexion 130    Shoulder extension 35    Shoulder abduction 105    Shoulder adduction      Shoulder internal rotation 80 shoulder abd 45 degrees  Shoulder external rotation 20 Shoulder abd 45 degrees    Elbow flexion      Elbow extension      Wrist flexion      Wrist extension      Wrist ulnar deviation      Wrist radial deviation      Wrist pronation      Wrist supination          UPPER EXTREMITY MMT:   MMT Right 10/19/2021 Left 10/19/2021  Shoulder flexion 2+/5 4+/5  Shoulder extension 2+/5 4+/5  Shoulder abduction 2+/5 4+/5  Shoulder adduction      Shoulder internal rotation 2+/5 4+/5  Shoulder external rotation 2+/5 4+/5  Middle trapezius      Lower trapezius      Elbow flexion      Elbow extension      Wrist flexion      Wrist extension      Wrist ulnar deviation      Wrist radial deviation      Wrist pronation      Wrist supination      Grip strength (lbs) 31.8 pounds 48 pounds    (Blank rows = not tested)                  TODAY'S TREATMENT:     11/01/2021 Therapeutic Exercises:   UBE x 3 minutes forward Level 1 -Pulleys: flexion x 3 minutes Wall Ladder: x 10 (highest level was 16) -table slides:   ER and scaption x 15 each holding 5-10 seconds -AAROM: with 1# bar flexion and ER x 15 holding 5 seconds at end range to pt's tolerance.  Manual: PROM Rt shoulder and Grade 2-3 mobs Modalities: moist heat x 5 minutes to Rt shoulder   10/26/21 Therapeutic Exercises:   -Pulleys: abduction 2 minutes, flexion x 2 minutes -table slides:  ER and scaption x 15 each holding 5-10 seconds -AAROM: with 1# bar flexion and ER x 15 holding 5 seconds at end range to pt's tolerance.  Manual:  PROM to right shoulder Grade 2-3 AP GH shoulder mobs  Modalities:  Moist Heat: 5 minutes to right shoulder      10/19/2021: Therapeutic Exercises:  -Supine shoulder flexion: AAROM with 1# bar x 10 holding 5 seconds, pt reporting increased pain with initial motion in arc 30-90 degrees. -Table slides: flexion and ER x 10 holding 5-10 seconds each -Scapular retraction holding 5 seconds x 10 in sitting       PATIENT EDUCATION: 11/01/2021 Education details: Discussed taking it easy with her exercises due to increased "popping" sensation.  Person educated: Patient Education method: Explanation Education comprehension: verbalized understanding, returned demonstration, and verbal cues required     HOME EXERCISE PROGRAM: Access Code: [email or web address] URL: [email or web address] Date: 10/19/2021 Prepared by: Kearney Hard   Exercises Seated Shoulder Flexion Towel Slide at Table Top - 2-3 x daily - 7 x weekly - 1 sets - 10 reps - 5-10 seocnds hold Seated Shoulder External Rotation PROM on Table - 2-3 x daily - 7 x weekly - 1 sets - 10 reps - 5-10 seconds hold Seated Scapular Retraction - 2-3 x daily - 7 x weekly - 1 sets - 10 reps - 5-10 seconds hold     ASSESSMENT:   CLINICAL IMPRESSION:  2/13/ 2023: Pt arriving today reporting 4/10 pain in her right shoulder. Pt reporting "popping" sensation noted around posterior shoulder. Pt with 3 episodes of the "popping" during therapy session with  exercises. Pt was  instructed to focus on more gentle stretching for her HEP. Pt feels she may have over done it over the weekend added some of her own exercises to her HEP and added stretching due to decreased pain. Pt reporting feeling better after gentle mobs and moist heat treatment. Continue skilled PT to maximize function.    10/26/2021:  Pt with limited tolerance to exercises due to increased pain in her right shoulder of 7-8/10. Pt reporting her pain has increased due to tolerance of her infusion treatments. Pt reporting pain more with end ranges. Pt with good tolerance to manual therapy. Pt's AAROM of her right shoulder flexion was 135 degrees. Continue skilled PT to maximize pt's function.   10/19/2021:  Patient is a 71 y.o. female who was seen today for physical therapy evaluation and treatment for right shoulder pain. Objective impairments include decreased mobility, decreased ROM, decreased strength, increased edema, impaired flexibility, impaired UE functional use, and pain.  These impairments are limiting patient from cleaning and community activity. Personal factors including 3+ comorbidities:   Arthritis Bowen's diseaseexcised 1992 Chronic low back pain Chronic radiation cystitis DDD (degenerative disc disease), cervical Family history of breast cancer Family history of lung cancer Family history of non-Hodgkin's lymphoma Family history of ovarian cancer Headache Hx of varicella Hydronephrosis of right kidney  are also affecting patient's functional outcome. Patient will benefit from skilled PT to address above impairments and improve overall function.   REHAB POTENTIAL: fair, due to co-morbidities   CLINICAL DECISION MAKING: Evolving/moderate complexity   EVALUATION COMPLEXITY: Moderate     GOALS: Goals reviewed with patient? Yes   SHORT TERM GOALS:   STG Name Target Date Goal status  1 Pt will be independent with her initial HEP.    11/16/2021 INITIAL  2 Pt will be able to  perform 5 time sit to stand with no UE support </= 15 seconds Baseline:  11/16/2021 INITIAL                                                 LONG TERM GOALS:    LTG Name Target Date Goal status  1 PT will be independent in her advanced HEP.  Baseline: 01/11/2022 INITIAL  2 Pt will improve FOTO score to >/= 63 % Baseline: 51% on 10/19/2021 01/11/2022 INITIAL  3 Pt will improve her right shoulder ER to >/= 30 degrees to improve functional mobility.  Baseline: 01/11/2022 INITIAL  4 Pt will be able to report pain of </= 2/10 with ADL's using her right UE.  Baseline: 01/11/2022 INITIAL                               PLAN: PT FREQUENCY: 1x/ week for 12 weeks   PT DURATION: 12 weeks   PLANNED INTERVENTIONS: Therapeutic exercises, Therapeutic activity, Neuro Muscular re-education, Balance training, Gait training, Patient/Family education, Joint mobilization, Dry Needling, Cryotherapy, Moist heat, Taping, Vasopneumatic device, and Manual therapy   PLAN FOR NEXT SESSION:  shoulder ROM, mobs, PROM, gentle stretching and strengthening as tolerated      Oretha Caprice, PT, MPT 11/01/2021, 4:02 PM

## 2021-11-03 ENCOUNTER — Inpatient Hospital Stay: Payer: Medicare HMO

## 2021-11-03 ENCOUNTER — Inpatient Hospital Stay: Payer: Medicare HMO | Admitting: Oncology

## 2021-11-04 ENCOUNTER — Inpatient Hospital Stay: Payer: Medicare HMO

## 2021-11-04 ENCOUNTER — Other Ambulatory Visit: Payer: Self-pay

## 2021-11-04 ENCOUNTER — Inpatient Hospital Stay: Payer: Medicare HMO | Admitting: Oncology

## 2021-11-04 ENCOUNTER — Encounter: Payer: Self-pay | Admitting: *Deleted

## 2021-11-04 VITALS — BP 108/56 | HR 87 | Temp 98.0°F | Resp 20

## 2021-11-04 VITALS — BP 145/87 | HR 100 | Temp 98.7°F | Resp 18 | Ht 62.0 in | Wt 154.8 lb

## 2021-11-04 DIAGNOSIS — C210 Malignant neoplasm of anus, unspecified: Secondary | ICD-10-CM

## 2021-11-04 DIAGNOSIS — Z79899 Other long term (current) drug therapy: Secondary | ICD-10-CM | POA: Diagnosis not present

## 2021-11-04 DIAGNOSIS — Z5112 Encounter for antineoplastic immunotherapy: Secondary | ICD-10-CM | POA: Diagnosis not present

## 2021-11-04 DIAGNOSIS — C772 Secondary and unspecified malignant neoplasm of intra-abdominal lymph nodes: Secondary | ICD-10-CM

## 2021-11-04 LAB — CMP (CANCER CENTER ONLY)
ALT: 14 U/L (ref 0–44)
AST: 24 U/L (ref 15–41)
Albumin: 4.1 g/dL (ref 3.5–5.0)
Alkaline Phosphatase: 79 U/L (ref 38–126)
Anion gap: 10 (ref 5–15)
BUN: 25 mg/dL — ABNORMAL HIGH (ref 8–23)
CO2: 22 mmol/L (ref 22–32)
Calcium: 9.6 mg/dL (ref 8.9–10.3)
Chloride: 102 mmol/L (ref 98–111)
Creatinine: 0.96 mg/dL (ref 0.44–1.00)
GFR, Estimated: >60 mL/min (ref >60)
Glucose, Bld: 75 mg/dL (ref 70–99)
Potassium: 4.2 mmol/L (ref 3.5–5.1)
Sodium: 134 mmol/L — ABNORMAL LOW (ref 135–145)
Total Bilirubin: 0.3 mg/dL (ref 0.3–1.2)
Total Protein: 7.7 g/dL (ref 6.5–8.1)

## 2021-11-04 LAB — CBC WITH DIFFERENTIAL (CANCER CENTER ONLY)
Abs Immature Granulocytes: 0.01 10*3/uL (ref 0.00–0.07)
Basophils Absolute: 0 10*3/uL (ref 0.0–0.1)
Basophils Relative: 1 %
Eosinophils Absolute: 0.1 10*3/uL (ref 0.0–0.5)
Eosinophils Relative: 3 %
HCT: 36.4 % (ref 36.0–46.0)
Hemoglobin: 11.3 g/dL — ABNORMAL LOW (ref 12.0–15.0)
Immature Granulocytes: 0 %
Lymphocytes Relative: 12 %
Lymphs Abs: 0.5 10*3/uL — ABNORMAL LOW (ref 0.7–4.0)
MCH: 27.2 pg (ref 26.0–34.0)
MCHC: 31 g/dL (ref 30.0–36.0)
MCV: 87.7 fL (ref 80.0–100.0)
Monocytes Absolute: 0.3 10*3/uL (ref 0.1–1.0)
Monocytes Relative: 6 %
Neutro Abs: 3.4 10*3/uL (ref 1.7–7.7)
Neutrophils Relative %: 78 %
Platelet Count: 215 10*3/uL (ref 150–400)
RBC: 4.15 MIL/uL (ref 3.87–5.11)
RDW: 15.8 % — ABNORMAL HIGH (ref 11.5–15.5)
WBC Count: 4.4 10*3/uL (ref 4.0–10.5)
nRBC: 0 % (ref 0.0–0.2)

## 2021-11-04 LAB — TSH: TSH: 4.22 u[IU]/mL (ref 0.350–4.500)

## 2021-11-04 MED ORDER — SODIUM CHLORIDE 0.9 % IV SOLN
240.0000 mg | Freq: Once | INTRAVENOUS | Status: AC
  Administered 2021-11-04: 240 mg via INTRAVENOUS
  Filled 2021-11-04: qty 24

## 2021-11-04 MED ORDER — SODIUM CHLORIDE 0.9 % IV SOLN: Freq: Once | INTRAVENOUS | Status: AC

## 2021-11-04 NOTE — Progress Notes (Signed)
Patient presents for treatment. RN assessment completed along with the following:  Labs/vitals reviewed - Yes, and Please see collab nurse note.    Weight within 10% of previous measurement - Yes Informed consent completed and reflects current therapy/intent - Yes, on date 1207/2022             Provider progress note reviewed - Today's provider note is not yet available. I reviewed the most recent oncology provider progress note in chart dated 10/20/2021. Treatment/Antibody/Supportive plan reviewed - Yes, and there are no adjustments needed for today's treatment. S&H and other orders reviewed - Yes, and there are no additional orders identified. Previous treatment date reviewed - Yes, and the appropriate amount of time has elapsed between treatments. Clinic Hand Off Received from - Yes Susan-C, RN  Patient to proceed with treatment.

## 2021-11-04 NOTE — Progress Notes (Signed)
Ashley Moore OFFICE PROGRESS NOTE   Diagnosis: Anal cancer  INTERVAL HISTORY:   Ashley Moore completed another cycle of nivolumab on 10/20/2021.  No rash or diarrhea.  She reports malaise for 3 to 4 days following the last treatment.  She developed pain "all over".  She had a steroid injection to the right shoulder on 10/28/2021.  She reports feeling better after the procedure.  She has developed purple discoloration of the hands.  No pain.  Objective:  Vital signs in last 24 hours:  Blood pressure (!) 145/87, pulse 100, temperature 98.7 F (37.1 C), temperature source Oral, resp. rate 18, height 5\' 2"  (1.575 m), weight 154 lb 12.8 oz (70.2 kg), SpO2 100 %.    Lymphatics: No cervical, supraclavicular, or inguinal nodes Resp: Scattered end inspiratory rales at the posterior chest bilaterally, no respiratory distress Cardio: Regular rate and rhythm GI: No mass, no hepatosplenomegaly, nontender Vascular: Trace edema at the lower leg bilaterally  Skin: Mild erythema to distal fingers bilaterally, no skin breakdown   Lab Results  Component Value Date   WBC 4.4 11/04/2021   HGB 11.3 (L) 11/04/2021   HCT 36.4 11/04/2021   MCV 87.7 11/04/2021   PLT 215 11/04/2021   NEUTROABS 3.4 11/04/2021    CMP  Lab Results  Component Value Date   NA 134 (L) 11/04/2021   K 4.2 11/04/2021   CL 102 11/04/2021   CO2 22 11/04/2021   GLUCOSE 75 11/04/2021   BUN 25 (H) 11/04/2021   CREATININE 0.96 11/04/2021   CALCIUM 9.6 11/04/2021   PROT 7.7 11/04/2021   ALBUMIN 4.1 11/04/2021   AST 24 11/04/2021   ALT 14 11/04/2021   ALKPHOS 79 11/04/2021   BILITOT 0.3 11/04/2021   GFRNONAA >60 11/04/2021   GFRAA >60 06/17/2020    Lab Results  Component Value Date   CEA1 1.15 03/04/2021   CEA 1.16 03/04/2021     Medications: I have reviewed the patient's current medications.   Assessment/Plan: Anal cancer CT abdomen/pelvis 10/21/2016-thickening of the anus extending to the  junction between the anus and rectum with a large stool ball in the rectum and mild fat stranding posterior to the rectum. Enlarged lymph nodes in the right and left inguinal regions. A few mildly prominent nodes seen posterior to the rectum. 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 PET scan [id number] hypermetabolic anal mass with hypermetabolic metastases to the groin region bilaterally, left pelvic sidewall and presacral space. Initiation of radiation and cycle 1 5-FU/mitomycin C 11/14/2016 Cycle 2 5-FU/mitomycin C 12/12/2016 (5-FU dose reduced due to mucositis, diarrhea, skin breakdown) Radiation completed 12/23/2016 CT abdomen/pelvis 04/14/2017-resolution of anal mass and bilateral inguinal lymphadenopathy. No residual tumor seen. CT abdomen/pelvis 01/28/2020-right hydroureteronephrosis, transition at the level of the mid right ureter, retroperitoneal lymphadenopathy CT renal stone study 01/28/2020-severe right hydronephrosis and proximal right hydroureter, 8 mm area of increased attenuation in the proximal right ureter-stone versus soft tissue mass PET 02/13/2020-hypermetabolic right retroperitoneal nodal metastases in the aortocaval and posterior pericaval chains, nonspecific mild anal wall hypermetabolism, right nephroureteral stent 02/21/2020 anorectal exam per Dr. Ashley Jacobs radiation changes of the skin around the anal margin.  Posterior midline smooth scarring.  Mildly stenotic.  Stenosis seems better.  No palpable concerning lesions.  Entire anal canal and distal rectum feels smooth and healthy.  Partial anoscopy performed with no lesions in the anal canal. Xeloda/radiation beginning 03/02/2020, completed 04/13/2020 CT abdomen/pelvis 06/17/2020-resolution of right retroperitoneal lymphadenopathy, no remaining pathologically enlarged lymph nodes, residual  mild right hydronephrosis, no evidence of progressive disease, stable anal wall thickening Digital rectal  exam by Dr. Quentin Cornwall 08/26/2020-posterior midline smooth scarring.  Mildly stenotic.  Stenosis seems better.  No palpable concerning lesions.  Anal canal feels smooth without palpable concern.  Unable to tolerate anoscopy.  Next visit in 6 months for surveillance. CT abdomen/pelvis 10/28/2020-no evidence of local recurrence or metastatic disease.  Stable mild rectal wall thickening.  New diffuse bladder wall thickening possibly related to prior radiation CT abdomen/pelvis 01/16/2021- recurrent right-sided hydronephrosis and proximal right hydroureter status post removal of right ureter stent, new soft tissue nodule along the course of the right ureter between the right psoas and IVC suspicious for recurrent lymphadenopathy PET 02/12/2021-small focus of hypermetabolism at the anorectal junction without a definable mass, right retroperitoneal nodal metastasis with obstruction of the proximal right ureter, right middle lobe hypermetabolic nodule, hypermetabolism in the right hilum without a defined nodal mass, hypermetabolic left supraclavicular and left axillary nodes 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 Cycle 1 Taxol/carboplatin 03/11/2021 Cycle 2 Taxol/carboplatin 03/31/2021, Fulphila Cycle 3 Taxol/carboplatin 04/21/2021, Fulphila CTs 05/11/2021-decrease size of FDG avid nodule in right middle lobe, right middle lobe subpleural nodule slightly enlarged, decreased in size of left axillary and subpectoral nodes, unchanged FDG avid right iliac nodes Cycle 4 Taxol/carboplatin 05/12/2021, G-CSF discontinued secondary to poor tolerance, Taxol and carboplatin dose reduced Cycle 5 Taxol/carboplatin 06/02/2021 Cycle 6 carboplatin 07/01/2021, Taxol held due to neuropathy Cycle 7 carboplatin 07/29/2021, Taxol held due to neuropathy CTs 08/16/2021-new liver lesion, increase in right psoas lymph node, unchanged right hydronephrosis, new mediastinal and right hilar  lymphadenopathy, enlargement of bilateral pulmonary nodules and at least one new nodule, new subcarinal node Cycle 1 nivolumab 08/25/2021 Cycle 2 nivolumab 09/08/2021 Cycle 3 nivolumab 09/22/2021 Cycle 4 nivolumab 10/06/2021 Cycle 5 nivolumab 10/20/2021 Cycle 6 nivolumab 11/04/2021 Left labial lesions. Question direct extension from the anal cancer versus metastatic disease from anal cancer versus a separate malignant process. 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 Multiple family members with breast cancer. History of hypokalemia-likely secondary to decreased nutritional intake and diarrhea; potassium in normal range 01/16/2017. No longer taking a potassium supplement. Right hydroureteronephrosis on CT 01/28/2020, status post a cystoscopy/pyelogram 01/28/2020 confirming extrinsic compression of the right ureter, status post stent placement Right ureter stent exchange 06/12/2020 Right ureter stent removed 01/13/2021   8.  Admission 01/07/2021 with Klebsiella pneumoniae urosepsis/bacteremia  9.  Hospital admission 05/15/2021 with UTI/possible right pyelonephritis        Disposition: Ms. Stehr has completed 5 treatments with nivolumab.  She tolerated nivolumab well.  She will complete cycle 6 today.  She will undergo a restaging CT evaluation after the cycle.  Her clinical status has improved while on nivolumab.  The neck nodes are no longer palpable.  The etiology of her symptoms following the last cycle of chemotherapy is unclear.  This does not appear to be typical of toxicity related to nivolumab.  I encouraged her to avoid steroid injections if possible as this may reduce the effectiveness of nivolumab.  Ashley Coder, MD  11/04/2021  12:28 PM

## 2021-11-04 NOTE — Progress Notes (Signed)
Patient seen by Dr. Sherrill today ? ?Vitals are within treatment parameters. ? ?Labs reviewed by Dr. Sherrill and are within treatment parameters. ? ?Per physician team, patient is ready for treatment and there are NO modifications to the treatment plan.  ?

## 2021-11-04 NOTE — Patient Instructions (Signed)
[facility]   Discharge Instructions: Thank you for choosing [facility] to provide your oncology and hematology care.   If you have a lab appointment with the [facility], please go directly to the [facility] and check in at the registration area.   Wear comfortable clothing and clothing appropriate for easy access to any Portacath or PICC line.   We strive to give you quality time with your provider. You may need to reschedule your appointment if you arrive late (15 or more minutes).  Arriving late affects you and other patients whose appointments are after yours.  Also, if you miss three or more appointments without notifying the office, you may be dismissed from the clinic at the providers discretion.      For prescription refill requests, have your pharmacy contact our office and allow 72 hours for refills to be completed.    Today you received the following chemotherapy and/or immunotherapy agents Nivolumab (OPDIVO).      To help prevent nausea and vomiting after your treatment, we encourage you to take your nausea medication as directed.  BELOW ARE SYMPTOMS THAT SHOULD BE REPORTED IMMEDIATELY: *FEVER GREATER THAN 100.4 F (38 C) OR HIGHER *CHILLS OR SWEATING *NAUSEA AND VOMITING THAT IS NOT CONTROLLED WITH YOUR NAUSEA MEDICATION *UNUSUAL SHORTNESS OF BREATH *UNUSUAL BRUISING OR BLEEDING *URINARY PROBLEMS (pain or burning when urinating, or frequent urination) *BOWEL PROBLEMS (unusual diarrhea, constipation, pain near the anus) TENDERNESS IN MOUTH AND THROAT WITH OR WITHOUT PRESENCE OF ULCERS (sore throat, sores in mouth, or a toothache) UNUSUAL RASH, SWELLING OR PAIN  UNUSUAL VAGINAL DISCHARGE OR ITCHING   Items with * indicate a potential emergency and should be followed up as soon as possible or go to the Emergency Department if any problems should occur.  Please show the CHEMOTHERAPY ALERT CARD or IMMUNOTHERAPY ALERT CARD at  check-in to the Emergency Department and triage nurse.  Should you have questions after your visit or need to cancel or reschedule your appointment, please contact [facility]  Dept: [phone]  and follow the prompts.  Office hours are 8:00 a.m. to 4:30 p.m. Monday - Friday. Please note that voicemails left after 4:00 p.m. may not be returned until the following business day.  We are closed weekends and major holidays. You have access to a nurse at all times for urgent questions. Please call the main number to the clinic Dept: [phone] and follow the prompts.   For any non-urgent questions, you may also contact your provider using MyChart. We now offer e-Visits for anyone 83 and older to request care online for non-urgent symptoms. For details visit [email or web address].   Also download the MyChart app! Go to the app store, search "MyChart", open the app, select [facility], and log in with your MyChart username and password.  Due to Covid, a mask is required upon entering the hospital/clinic. If you do not have a mask, one will be given to you upon arrival. For doctor visits, patients may have 1 support person aged 78 or older with them. For treatment visits, patients cannot have anyone with them due to current Covid guidelines and our immunocompromised population.   Nivolumab injection What is this medication? NIVOLUMAB (nye VOL ue mab) is a monoclonal antibody. It treats certain types of cancer. Some of the cancers treated are colon cancer, head and neck cancer, Hodgkin lymphoma, lung cancer, and melanoma. This medicine may be used for other purposes;  ask your health care provider or pharmacist if you have questions. COMMON BRAND NAME(S): Opdivo What should I tell my care team before I take this medication? They need to know if you have any of these conditions: Autoimmune diseases such as Crohn's disease, ulcerative colitis, or lupus Have had or planning to  have an allogeneic stem cell transplant (uses someone else's stem cells) History of chest radiation Organ transplant Nervous system problems such as myasthenia gravis or Guillain-Barre syndrome An unusual or allergic reaction to nivolumab, other medicines, foods, dyes, or preservatives Pregnant or trying to get pregnant Breast-feeding How should I use this medication? This medication is injected into a vein. It is given in a hospital or clinic setting. A special MedGuide will be given to you before each treatment. Be sure to read this information carefully each time. Talk to your care team regarding the use of this medication in children. While it may be prescribed for children as young as 12 years for selected conditions, precautions do apply. Overdosage: If you think you have taken too much of this medicine contact a poison control center or emergency room at once. NOTE: This medicine is only for you. Do not share this medicine with others. What if I miss a dose? Keep appointments for follow-up doses. It is important not to miss your dose. Call your care team if you are unable to keep an appointment. What may interact with this medication? Interactions have not been studied. This list may not describe all possible interactions. Give your health care provider a list of all the medicines, herbs, non-prescription drugs, or dietary supplements you use. Also tell them if you smoke, drink alcohol, or use illegal drugs. Some items may interact with your medicine. What should I watch for while using this medication? Your condition will be monitored carefully while you are receiving this medication. You may need blood work done while you are taking this medication. Do not become pregnant while taking this medication or for 5 months after stopping it. Women should inform their care team if they wish to become pregnant or think they might be pregnant. There is a potential for serious harm to an unborn  child. Talk to your care team for more information. Do not breast-feed an infant while taking this medication or for 5 months after stopping it. What side effects may I notice from receiving this medication? Side effects that you should report to your care team as soon as possible: Allergic reactions--skin rash, itching, hives, swelling of the face, lips, tongue, or throat Bloody or black, tar-like stools Change in vision Chest pain Diarrhea Dry cough, shortness of breath or trouble breathing Eye pain Fast or irregular heartbeat Fever, chills High blood sugar (hyperglycemia)--increased thirst or amount of urine, unusual weakness or fatigue, blurry vision High thyroid levels (hyperthyroidism)--fast or irregular heartbeat, weight loss, excessive sweating or sensitivity to heat, tremors or shaking, anxiety, nervousness, irregular menstrual cycle or spotting Kidney injury--decrease in the amount of urine, swelling of the ankles, hands, or feet Liver injury--right upper belly pain, loss of appetite, nausea, light-colored stool, dark yellow or brown urine, yellowing skin or eyes, unusual weakness or fatigue Low red blood cell count--unusual weakness or fatigue, dizziness, headache, trouble breathing Low thyroid levels (hypothyroidism)--unusual weakness or fatigue, increased sensitivity to cold, constipation, hair loss, dry skin, weight gain, feelings of depression Mood and behavior changes-confusion, change in sex drive or performance, irritability Muscle pain or cramps Pain, tingling, or numbness in the hands or feet, muscle  weakness, trouble walking, loss of balance or coordination Red or dark brown urine Redness, blistering, peeling, or loosening of the skin, including inside the mouth Stomach pain Unusual bruising or bleeding Side effects that usually do not require medical attention (report to your care team if they continue or are bothersome): Bone pain Constipation Loss of  appetite Nausea Tiredness Vomiting This list may not describe all possible side effects. Call your doctor for medical advice about side effects. You may report side effects to FDA at 1-800-FDA-1088. Where should I keep my medication? This medication is given in a hospital or clinic and will not be stored at home. NOTE: This sheet is a summary. It may not cover all possible information. If you have questions about this medicine, talk to your doctor, pharmacist, or health care provider.  2022 Elsevier/Gold Standard (2021-05-25 00:00:00)

## 2021-11-05 ENCOUNTER — Telehealth: Payer: Self-pay | Admitting: Orthopaedic Surgery

## 2021-11-05 NOTE — Telephone Encounter (Signed)
Pt called stating she has an upcoming appt but can not received cortisone injection. Pt states her dr states the injection will interfere with her cancer meds. Pt will keep appt for 2/21 to talk about other options for pain

## 2021-11-08 ENCOUNTER — Telehealth: Payer: Self-pay

## 2021-11-08 NOTE — Telephone Encounter (Signed)
Please advise 

## 2021-11-08 NOTE — Telephone Encounter (Signed)
FYI

## 2021-11-08 NOTE — Telephone Encounter (Signed)
thanks

## 2021-11-08 NOTE — Telephone Encounter (Signed)
Patient called into the office stating that she didn't want to keep her apt for the 21st due to the fact that she can't get another injection.  She wanted to know if she did have options.  Please advise

## 2021-11-09 ENCOUNTER — Ambulatory Visit: Payer: Medicare HMO | Admitting: Physical Therapy

## 2021-11-09 ENCOUNTER — Other Ambulatory Visit: Payer: Self-pay

## 2021-11-09 ENCOUNTER — Ambulatory Visit: Payer: Medicare HMO | Admitting: Orthopaedic Surgery

## 2021-11-09 DIAGNOSIS — M25611 Stiffness of right shoulder, not elsewhere classified: Secondary | ICD-10-CM | POA: Diagnosis not present

## 2021-11-09 DIAGNOSIS — R293 Abnormal posture: Secondary | ICD-10-CM

## 2021-11-09 DIAGNOSIS — M6281 Muscle weakness (generalized): Secondary | ICD-10-CM | POA: Diagnosis not present

## 2021-11-09 DIAGNOSIS — G8929 Other chronic pain: Secondary | ICD-10-CM | POA: Diagnosis not present

## 2021-11-09 DIAGNOSIS — R600 Localized edema: Secondary | ICD-10-CM | POA: Diagnosis not present

## 2021-11-09 DIAGNOSIS — M25511 Pain in right shoulder: Secondary | ICD-10-CM | POA: Diagnosis not present

## 2021-11-09 NOTE — Telephone Encounter (Signed)
Definitive treatment is shoulder replacement-anything less most likely would be ineffective-exercises, voltaren gel

## 2021-11-09 NOTE — Therapy (Signed)
OUTPATIENT PHYSICAL THERAPY TREATMENT NOTE   Patient Name: Ashley Moore MRN: [id number] DOB:26-Oct-1950, 71 y.o., female Today's Date: 11/09/2021  PCP: [name], MD REFERRING PROVIDER: Persons, [name], [place]   PT End of Session - 11/09/21 1518     Visit Number 4    Number of Visits 12    Date for PT Re-Evaluation 01/11/22    Authorization Type [employer] 12 visits applied for on 10/19/2021    Progress Note Due on Visit 10    PT Start Time 1430    PT Stop Time 0932    PT Time Calculation (min) 45 min    Activity Tolerance Patient tolerated treatment well;Patient limited by pain    Behavior During Therapy [facility] for tasks assessed/performed              Past Medical History:  Diagnosis Date   Arthritis    Bowen's disease    excised 1992   Chronic low back pain    Chronic radiation cystitis    DDD (degenerative disc disease), cervical    Family history of breast cancer    Family history of lung cancer    Family history of non-Hodgkin's lymphoma    Family history of ovarian cancer    Headache    Hx of varicella    Hydronephrosis of right kidney    urologist--- dr Tresa Moore,  malignant,  treated with ureter stent   Hypothyroidism    followed by pcp   Lower urinary tract symptoms (LUTS)    01-12-2021  per pt treated with accupuncture at dr Tresa Moore office   Lymphedema of both lower extremities    PICC (peripherally inserted central catheter) in place 01/11/2021   placed at [facility] in [place], [place] for IV antibiotics   Positive blood culture 01/08/2021   Klebsiella Pneumaniae,  treated with daily IV antibiotic   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[facility]) oncologist--- dr Benay Spice   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,  invasive SCC , completed chemo/ radiation 12-23-2016;  recurrent metastatsis retroperitoneal lymphdenopathy,  completed radiation 04-13-2020 residual right ureteral uropathy obstruction   Retroperitoneal lymphadenopathy    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 completed  07/ 2021   Sepsis due to Klebsiella pneumoniae ([place]) 01/07/2021   pt admitted to [facility] in [place],  dx sepsis secondary to acute pyelonephritis with bacteremia , positive blood culture,  discharged 01-11-2021 home daily IV antibiotic   Wears glasses    Past Surgical History:  Procedure Laterality Date   BUNIONECTOMY Right yrs ago   [place] Right 06/12/2020   Procedure: CYSTOSCOPY WITH RETROGRADE PYELOGRAM/URETERAL STENT EXCHANGE;  Surgeon: Alexis Frock, MD;  Location: [facility];  Service: Urology;  Laterality: Right;   CYSTOSCOPY W/ URETERAL STENT PLACEMENT Right 01/13/2021   Procedure: CYSTOSCOPY WITH RETROGRADE PYELOGRAM/URETERAL STENT REMOVALRIGHT;  Surgeon: Alexis Frock, MD;  Location: [facility];  Service: Urology;  Laterality: Right;  [place], URETEROSCOPY AND STENT PLACEMENT Right 01/28/2020   Procedure: CYSTOSCOPY WITH RETROGRADE PYELOGRAM, URETEROSCOPY AND STENT PLACEMENT;  Surgeon: Alexis Frock, MD;  Location: [facility];  Service: Urology;  Laterality: Right;   IR GENERIC HISTORICAL  11/14/2016   IR US GUIDE VASC ACCESS RIGHT 11/14/2016 [facility]   IR GENERIC HISTORICAL  11/14/2016   IR FLUORO GUIDE CV LINE RIGHT 11/14/2016 [facility]   IR GENERIC HISTORICAL  12/12/2016   IR US GUIDE VASC ACCESS RIGHT 12/12/2016 [name],  MD [facility]   IR GENERIC HISTORICAL  12/12/2016   IR FLUORO GUIDE CV LINE RIGHT 12/12/2016 [name], MD [facility]   SKIN CANCER EXCISION  1990   bowens disease   Patient Active Problem List   Diagnosis Date Noted   Primary osteoarthritis, 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/28/2021   Atypical chest pain 06/29/2021   Intractable abdominal pain 05/16/2021   Acute pyelonephritis 05/16/2021   Acute cystitis without hematuria 05/16/2021   Hydronephrosis of right kidney 05/16/2021   Obstructive uropathy 05/16/2021   Hypothyroidism 05/16/2021    Hyponatremia 05/16/2021   Obesity (BMI 30-39.9) 05/16/2021   Goals of care, counseling/discussion 03/01/2021   SIRS (systemic inflammatory response syndrome) ([place]) 01/13/2021   Pyelonephritis 01/13/2021   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   Genetic testing 01/27/2020   Family history of breast cancer    Family history of lung cancer    Family history of ovarian cancer    Family history of non-Hodgkin's lymphoma    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   Port catheter in place 11/21/2016   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   Anal cancer ([place]) 11/03/2016   Cystitis 05/06/2015   Degenerative cervical disc 04/08/2015   Trigger point of neck 03/26/2015   Pain in joint, ankle and foot 04/23/2013   Visit for preventive health examination 02/13/2013   Routine gynecological examination 02/13/2013   Family history of breast cancer in first degree relative 02/13/2013   Rash and nonspecific skin eruption 02/13/2013   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   VITAMIN D DEFICIENCY 06/25/2008   NECK PAIN 06/02/2008   FOOT PAIN 06/02/2008   Osteopenia 06/02/2008    REFERRING DIAG: M25.511,G89.29 (ICD-10-CM) - Chronic right shoulder pain   THERAPY DIAG:  Stiffness of right shoulder, not elsewhere classified  Localized edema  Muscle weakness (generalized)  Chronic right shoulder pain  Abnormal posture  PERTINENT HISTORY: Arthritis Bowen's diseaseexcised 1992 Chronic low back pain Chronic radiation cystitis DDD (degenerative disc disease), cervical Family history of breast cancer Family history of lung cancer Family history of non-Hodgkin's lymphoma Family history of ovarian cancer Headache Hx of varicella Hydronephrosis of right kidney rectal cancer   PRECAUTIONS: Other: infusion therapy for Cancer , fall precuations   SUBJECTIVE: Pt arriving today reporting 4/10 pain in her left shoulder. Pt also reporting her MD is going to inject her knee.   PAIN:   Are you having pain? Yes NPRS scale: 4/10 Pain location: anterior shoulder Pain orientation: Right  PAIN TYPE: aching Pain description: constant  Aggravating factors: reaching upward Relieving factors: resting and heat    OBJECTIVE:      PATIENT SURVEYS:  FOTO: 51% on 10/19/2021  UPPER EXTREMITY AROM/PROM:   Active ROM  In supine Right 10/19/2021 Left 10/19/2021  Shoulder flexion 120 164  Shoulder extension 30 45    Shoulder abduction 98 160  Shoulder adduction      Shoulder internal rotation 75 shoulder abd 45 degrees 75  Shoulder external rotation 15 Shoulder abd 45 degrees 80  Elbow flexion 140   145  Elbow extension 0 0  Wrist flexion      Wrist extension      Wrist ulnar deviation      Wrist radial deviation      Wrist pronation      Wrist supination      (Blank rows = not tested)   Passive ROM Right 10/19/2021 Right 11/09/2021  Shoulder flexion 130  120  Shoulder extension 35  Shoulder abduction 105 104   Shoulder adduction      Shoulder internal rotation 80 shoulder abd 45 degrees    Shoulder external rotation 20 Shoulder abd 45 degrees 30 Shoulder abducted to 45 degrees   Elbow flexion      Elbow extension      Wrist flexion      Wrist extension      Wrist ulnar deviation      Wrist radial deviation      Wrist pronation      Wrist supination          UPPER EXTREMITY MMT:   MMT Right 10/19/2021 Left 10/19/2021  Shoulder flexion 2+/5 4+/5  Shoulder extension 2+/5 4+/5  Shoulder abduction 2+/5 4+/5  Shoulder adduction      Shoulder internal rotation 2+/5 4+/5  Shoulder external rotation 2+/5 4+/5  Middle trapezius      Lower trapezius      Elbow flexion      Elbow extension      Wrist flexion      Wrist extension      Wrist ulnar deviation      Wrist radial deviation      Wrist pronation      Wrist supination      Grip strength (lbs) 31.8 pounds 48 pounds    (Blank rows = not tested)                  TODAY'S TREATMENT:    11/09/2021  Therapeutic Exercise:  Aerobic: UBE: 8 minutes (4 minutes each direction) Supine: Prone:  Seated:  Pulleys scaption x 3 minutes   Pulleys abduction attempted, pt unable to tolerate   UE ranger x 10 Rt UE   Rt shoulder flexion AAROM with 1# bar x 20  Standing:     Door stretches: 60 degrees holding 1 minute Neuromuscular Re-education: Manual Therapy: PROM right shoulder Grade 2-3 AP mobs Therapeutic Activity: Self Care: Trigger Point Dry Needling:  Modalities: moist heat 5 minutes to right shoulder    11/01/2021 Therapeutic Exercises:   UBE x 3 minutes forward Level 1 -Pulleys: flexion x 3 minutes Wall Ladder: x 10 (highest level was 16) -table slides:  ER and scaption x 15 each holding 5-10 seconds -AAROM: with 1# bar flexion and ER x 15 holding 5 seconds at end range to pt's tolerance.  Manual: PROM Rt shoulder and Grade 2-3 mobs Modalities: moist heat x 5 minutes to Rt shoulder   10/26/21 Therapeutic Exercises:   -Pulleys: abduction 2 minutes, flexion x 2 minutes -table slides:  ER and scaption x 15 each holding 5-10 seconds -AAROM: with 1# bar flexion and ER x 15 holding 5 seconds at end range to pt's tolerance.  Manual:  PROM to right shoulder Grade 2-3 AP GH shoulder mobs  Modalities:  Moist Heat: 5 minutes to right shoulder       PATIENT EDUCATION: 11/01/2021 Education details: Discussed taking it easy with her exercises due to increased "popping" sensation.  Person educated: Patient Education method: Explanation Education comprehension: verbalized understanding, returned demonstration, and verbal cues required     HOME EXERCISE PROGRAM: Access Code: [email or web address] URL: [email or web address] Date: 10/19/2021 Prepared by: Kearney Hard   Exercises Seated Shoulder Flexion Towel Slide at Table Top - 2-3 x daily - 7 x weekly - 1 sets - 10 reps - 5-10 seocnds hold Seated Shoulder External Rotation PROM on Table - 2-3 x daily - 7 x  weekly - 1 sets - 10  reps - 5-10 seconds hold Seated Scapular Retraction - 2-3 x daily - 7 x weekly - 1 sets - 10 reps - 5-10 seconds hold     ASSESSMENT:   CLINICAL IMPRESSION: 11/09/2021:  Pt arriving today reporting increased pain of 8/10 at times in her right shoulder. Pt stating she received an injection from Dr. Durward Fortes last week  on 11/01/2021. Pt stating it helped for a few days but pt feels now her pain returned again yesterday. Pt stating she was sick from her infusion on Thursday. Pt stating she has been taking over the counter pain meds as needed. Pt is now able to reach the left side of her face using her right UE. Pt tolerating exercises with pain reported at end range movements in right shoulder. Continue skilled PT to maximize function.    2/13/ 2023: Pt arriving today reporting 4/10 pain in her right shoulder. Pt reporting "popping" sensation noted around posterior shoulder. Pt with 3 episodes of the "popping" during therapy session with exercises. Pt was instructed to focus on more gentle stretching for her HEP. Pt feels she may have over done it over the weekend added some of her own exercises to her HEP and added stretching due to decreased pain. Pt reporting feeling better after gentle mobs and moist heat treatment. Continue skilled PT to maximize function.    10/26/2021:  Pt with limited tolerance to exercises due to increased pain in her right shoulder of 7-8/10. Pt reporting her pain has increased due to tolerance of her infusion treatments. Pt reporting pain more with end ranges. Pt with good tolerance to manual therapy. Pt's AAROM of her right shoulder flexion was 135 degrees. Continue skilled PT to maximize pt's function.   10/19/2021:  Patient is a 71 y.o. female who was seen today for physical therapy evaluation and treatment for right shoulder pain. Objective impairments include decreased mobility, decreased ROM, decreased strength, increased edema, impaired flexibility,  impaired UE functional use, and pain.  These impairments are limiting patient from cleaning and community activity. Personal factors including 3+ comorbidities:   Arthritis Bowen's diseaseexcised 1992 Chronic low back pain Chronic radiation cystitis DDD (degenerative disc disease), cervical Family history of breast cancer Family history of lung cancer Family history of non-Hodgkin's lymphoma Family history of ovarian cancer Headache Hx of varicella Hydronephrosis of right kidney  are also affecting patient's functional outcome. Patient will benefit from skilled PT to address above impairments and improve overall function.   REHAB POTENTIAL: fair, due to co-morbidities   CLINICAL DECISION MAKING: Evolving/moderate complexity   EVALUATION COMPLEXITY: Moderate     GOALS: Goals reviewed with patient? Yes   SHORT TERM GOALS:   STG Name Target Date Goal status  1 Pt will be independent with her initial HEP.    11/16/2021 Met 11/09/2021  2 Pt will be able to perform 5 time sit to stand with no UE support </= 15 seconds Baseline:  11/16/2021 Ongoing 11/09/2021                                                 LONG TERM GOALS:    LTG Name Target Date Goal status  1 PT will be independent in her advanced HEP.  Baseline: 01/11/2022 INITIAL  2 Pt will improve FOTO score to >/= 63 % Baseline: 51% on 10/19/2021 01/11/2022 INITIAL  3  Pt will improve her right shoulder ER to >/= 30 degrees to improve functional mobility.  Baseline: 01/11/2022 INITIAL  4 Pt will be able to report pain of </= 2/10 with ADL's using her right UE.  Baseline: 01/11/2022 INITIAL                               PLAN: PT FREQUENCY: 1x/ week for 12 weeks   PT DURATION: 12 weeks   PLANNED INTERVENTIONS: Therapeutic exercises, Therapeutic activity, Neuro Muscular re-education, Balance training, Gait training, Patient/Family education, Joint mobilization, Dry Needling, Cryotherapy, Moist heat, Taping, Vasopneumatic device, and  Manual therapy   PLAN FOR NEXT SESSION:  shoulder ROM, mobs, PROM, gentle stretching and strengthening as tolerated, UBE as tolerated      Oretha Caprice, PT, MPT 11/09/2021, 3:20 PM

## 2021-11-11 NOTE — Telephone Encounter (Signed)
Spoke and relayed information to patient.

## 2021-11-12 ENCOUNTER — Other Ambulatory Visit: Payer: Self-pay

## 2021-11-12 ENCOUNTER — Ambulatory Visit (HOSPITAL_BASED_OUTPATIENT_CLINIC_OR_DEPARTMENT_OTHER)
Admission: RE | Admit: 2021-11-12 | Discharge: 2021-11-12 | Disposition: A | Discharge status: Home / Self Care | Payer: Medicare HMO | Source: Ambulatory Visit | Attending: Oncology | Admitting: Oncology

## 2021-11-12 DIAGNOSIS — C210 Malignant neoplasm of anus, unspecified: Secondary | ICD-10-CM | POA: Diagnosis not present

## 2021-11-12 DIAGNOSIS — N3289 Other specified disorders of bladder: Secondary | ICD-10-CM | POA: Diagnosis not present

## 2021-11-12 DIAGNOSIS — R918 Other nonspecific abnormal finding of lung field: Secondary | ICD-10-CM | POA: Diagnosis not present

## 2021-11-12 DIAGNOSIS — N1330 Unspecified hydronephrosis: Secondary | ICD-10-CM | POA: Diagnosis not present

## 2021-11-12 MED ORDER — IOHEXOL 300 MG/ML  SOLN
100.0000 mL | Freq: Once | INTRAMUSCULAR | Status: AC | PRN
  Administered 2021-11-12: 100 mL via INTRAVENOUS

## 2021-11-14 ENCOUNTER — Other Ambulatory Visit: Payer: Self-pay | Admitting: Oncology

## 2021-11-16 ENCOUNTER — Encounter: Payer: Self-pay | Admitting: Physical Therapy

## 2021-11-16 ENCOUNTER — Ambulatory Visit (INDEPENDENT_AMBULATORY_CARE_PROVIDER_SITE_OTHER): Payer: Medicare HMO | Admitting: Physical Therapy

## 2021-11-16 ENCOUNTER — Other Ambulatory Visit: Payer: Self-pay

## 2021-11-16 DIAGNOSIS — R293 Abnormal posture: Secondary | ICD-10-CM | POA: Diagnosis not present

## 2021-11-16 DIAGNOSIS — R600 Localized edema: Secondary | ICD-10-CM

## 2021-11-16 DIAGNOSIS — M25611 Stiffness of right shoulder, not elsewhere classified: Secondary | ICD-10-CM

## 2021-11-16 DIAGNOSIS — M6281 Muscle weakness (generalized): Secondary | ICD-10-CM

## 2021-11-16 DIAGNOSIS — M25511 Pain in right shoulder: Secondary | ICD-10-CM | POA: Diagnosis not present

## 2021-11-16 DIAGNOSIS — G8929 Other chronic pain: Secondary | ICD-10-CM

## 2021-11-16 NOTE — Therapy (Signed)
OUTPATIENT PHYSICAL THERAPY TREATMENT NOTE   Patient Name: [place] Moore MRN: [id number] DOB:29-May-1951, 71 y.o., female Today's Date: 11/16/2021  PCP: [name], MD REFERRING PROVIDER: Persons, [name], [place]   PT End of Session - 11/16/21 1519     Visit Number 5    Number of Visits 12    Date for PT Re-Evaluation 01/11/22    Authorization Type [employer] 12 visits applied for on 10/19/2021    Progress Note Due on Visit 10    PT Start Time 1430    PT Stop Time 1508    PT Time Calculation (min) 38 min    Activity Tolerance Patient tolerated treatment well    Behavior During Therapy WFL for tasks assessed/performed               Past Medical History:  Diagnosis Date   Arthritis    Bowen's disease    excised 1992   Chronic low back pain    Chronic radiation cystitis    DDD (degenerative disc disease), cervical    Family history of breast cancer    Family history of lung cancer    Family history of non-Hodgkin's lymphoma    Family history of ovarian cancer    Headache    Hx of varicella    Hydronephrosis of right kidney    urologist--- dr Tresa Moore,  malignant,  treated with ureter stent   Hypothyroidism    followed by pcp   Lower urinary tract symptoms (LUTS)    01-12-2021  per pt treated with accupuncture at dr Tresa Moore office   Lymphedema of both lower extremities    PICC (peripherally inserted central catheter) in place 01/11/2021   placed at [facility] in [place], [place] for IV antibiotics   Positive blood culture 01/08/2021   Klebsiella Pneumaniae,  treated with daily IV antibiotic   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   Sepsis due  to Klebsiella pneumoniae ([place]) 01/07/2021   pt admitted to [facility] in [place],  dx sepsis secondary to acute pyelonephritis with bacteremia , positive blood culture,  discharged 01-11-2021 home daily IV antibiotic   Wears glasses    Past Surgical History:  Procedure Laterality Date   BUNIONECTOMY Right yrs ago   [place] Right 06/12/2020   Procedure: CYSTOSCOPY WITH RETROGRADE PYELOGRAM/URETERAL STENT EXCHANGE;  Surgeon: Alexis Frock, MD;  Location: [facility];  Service: Urology;  Laterality: Right;   CYSTOSCOPY W/ URETERAL STENT PLACEMENT Right 01/13/2021   Procedure: CYSTOSCOPY WITH RETROGRADE PYELOGRAM/URETERAL STENT REMOVALRIGHT;  Surgeon: Alexis Frock, MD;  Location: [facility];  Service: Urology;  Laterality: Right;  Protection, URETEROSCOPY AND STENT PLACEMENT Right 01/28/2020   Procedure: CYSTOSCOPY WITH RETROGRADE PYELOGRAM, URETEROSCOPY AND STENT PLACEMENT;  Surgeon: Alexis Frock, MD;  Location: [facility];  Service: Urology;  Laterality: Right;   IR GENERIC HISTORICAL  11/14/2016   IR US GUIDE VASC ACCESS RIGHT 11/14/2016 [facility]   IR GENERIC HISTORICAL  11/14/2016   IR FLUORO GUIDE CV LINE RIGHT 11/14/2016 [facility]   IR GENERIC HISTORICAL  12/12/2016   IR US GUIDE VASC ACCESS RIGHT 12/12/2016 [name], MD [facility]   IR GENERIC HISTORICAL  12/12/2016   IR FLUORO GUIDE CV LINE RIGHT 12/12/2016 [name], MD [facility]   SKIN CANCER EXCISION  1990   bowens disease   Patient Active Problem List   Diagnosis Date Noted   Primary osteoarthritis, 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/28/2021   Atypical chest pain 06/29/2021   Intractable abdominal pain 05/16/2021   Acute pyelonephritis 05/16/2021   Acute cystitis without hematuria 05/16/2021   Hydronephrosis of right kidney 05/16/2021   Obstructive uropathy 05/16/2021   Hypothyroidism 05/16/2021   Hyponatremia  05/16/2021   Obesity (BMI 30-39.9) 05/16/2021   Goals of care, counseling/discussion 03/01/2021   SIRS (systemic inflammatory response syndrome) ([place]) 01/13/2021   Pyelonephritis 01/13/2021   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   Genetic testing 01/27/2020   Family history of breast cancer    Family history of lung cancer    Family history of ovarian cancer    Family history of non-Hodgkin's lymphoma    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   Port catheter in place 11/21/2016   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   Anal cancer ([place]) 11/03/2016   Cystitis 05/06/2015   Degenerative cervical disc 04/08/2015   Trigger point of neck 03/26/2015   Pain in joint, ankle and foot 04/23/2013   Visit for preventive health examination 02/13/2013   Routine gynecological examination 02/13/2013   Family history of breast cancer in first degree relative 02/13/2013   Rash and nonspecific skin eruption 02/13/2013   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   VITAMIN D DEFICIENCY 06/25/2008   NECK PAIN 06/02/2008   FOOT PAIN 06/02/2008   Osteopenia 06/02/2008    REFERRING DIAG: M25.511,G89.29 (ICD-10-CM) - Chronic right shoulder pain   THERAPY DIAG:  Stiffness of right shoulder, not elsewhere classified  Muscle weakness (generalized)  Abnormal posture  Localized edema  Chronic right shoulder pain  PERTINENT HISTORY: Arthritis Bowen's diseaseexcised 1992 Chronic low back pain Chronic radiation cystitis DDD (degenerative disc disease), cervical Family history of breast cancer Family history of lung cancer Family history of non-Hodgkin's lymphoma Family history of ovarian cancer Headache Hx of varicella Hydronephrosis of right kidney rectal cancer   PRECAUTIONS: Other: infusion therapy for Cancer , fall precuations   SUBJECTIVE: Pt arriving today reporting 3/10 pain in right shoulder. Pt reporting good report from CT scan.   PAIN:  Are you having pain? Yes NPRS  scale: 3/10 Pain location: anterior shoulder Pain orientation: Right  PAIN TYPE: aching Pain description: constant  Aggravating factors: reaching upward Relieving factors: resting and heat    OBJECTIVE:      PATIENT SURVEYS:  FOTO: 51% on 10/19/2021  UPPER EXTREMITY AROM/PROM:   Active ROM  In supine Right 10/19/2021 Left 10/19/2021  Shoulder flexion 120 164  Shoulder extension 30 45    Shoulder abduction 98 160  Shoulder adduction      Shoulder internal rotation 75 shoulder abd 45 degrees 75  Shoulder external rotation 15 Shoulder abd 45 degrees 80  Elbow flexion 140   145  Elbow extension 0 0  Wrist flexion      Wrist extension      Wrist ulnar deviation      Wrist radial deviation      Wrist pronation      Wrist supination      (Blank rows = not tested)   Passive ROM Right 10/19/2021 Right 11/09/2021 Right  11/16/2021  Shoulder flexion 130  120 134  Shoulder extension 35     Shoulder  abduction 105 104  110  Shoulder adduction       Shoulder internal rotation 80 shoulder abd 45 degrees     Shoulder external rotation 20 Shoulder abd 45 degrees 30 Shoulder abducted to 45 degrees    Elbow flexion       Elbow extension       Wrist flexion       Wrist extension       Wrist ulnar deviation       Wrist radial deviation       Wrist pronation       Wrist supination           UPPER EXTREMITY MMT:   MMT Right 10/19/2021 Left 10/19/2021  Shoulder flexion 2+/5 4+/5  Shoulder extension 2+/5 4+/5  Shoulder abduction 2+/5 4+/5  Shoulder adduction      Shoulder internal rotation 2+/5 4+/5  Shoulder external rotation 2+/5 4+/5  Middle trapezius      Lower trapezius      Elbow flexion      Elbow extension      Wrist flexion      Wrist extension      Wrist ulnar deviation      Wrist radial deviation      Wrist pronation      Wrist supination      Grip strength (lbs) 31.8 pounds 48 pounds    (Blank rows = not tested)                  TODAY'S TREATMENT:     11/16/2021: Therapeutic Exercise:  Aerobic: UBE: 8 minutes (4 minutes each direction) Supine: Prone:  Seated: table slides: scaption and ER x 10 holding 5-10 seconds   UE ranger x 10 Rt UE   Rt shoulder flexion AAROM with 1# bar x 20  Standing:      Neuromuscular Re-education: Manual Therapy: PROM right shoulder Grade 2-3 AP mobs Therapeutic Activity: Self Care: Trigger Point Dry Needling:  Modalities:   11/09/2021 Therapeutic Exercise:  Aerobic: UBE: 8 minutes (4 minutes each direction) Supine: Prone:  Seated:  Pulleys scaption x 3 minutes   Pulleys abduction attempted, pt unable to tolerate   UE ranger x 10 Rt UE   Rt shoulder flexion AAROM with 1# bar x 20  Standing:     Door stretches: 60 degrees holding 1 minute Neuromuscular Re-education: Manual Therapy: PROM right shoulder Grade 2-3 AP mobs Therapeutic Activity: Self Care: Trigger Point Dry Needling:  Modalities: moist heat 5 minutes to right shoulder    11/01/2021 Therapeutic Exercises:   UBE x 3 minutes forward Level 1 -Pulleys: flexion x 3 minutes Wall Ladder: x 10 (highest level was 16) -table slides:  ER and scaption x 15 each holding 5-10 seconds -AAROM: with 1# bar flexion and ER x 15 holding 5 seconds at end range to pt's tolerance.  Manual: PROM Rt shoulder and Grade 2-3 mobs Modalities: moist heat x 5 minutes to Rt shoulder   10/26/21 Therapeutic Exercises:   -Pulleys: abduction 2 minutes, flexion x 2 minutes -table slides:  ER and scaption x 15 each holding 5-10 seconds -AAROM: with 1# bar flexion and ER x 15 holding 5 seconds at end range to pt's tolerance.  Manual:  PROM to right shoulder Grade 2-3 AP GH shoulder mobs  Modalities:  Moist Heat: 5 minutes to right shoulder       PATIENT EDUCATION: 11/01/2021 Education details: Discussed taking it easy with her exercises due to increased "popping"  sensation.  Person educated: Patient Education method: Explanation Education  comprehension: verbalized understanding, returned demonstration, and verbal cues required     HOME EXERCISE PROGRAM: Access Code: [email or web address] URL: [email or web address] Date: 10/19/2021 Prepared by: Kearney Hard   Exercises Seated Shoulder Flexion Towel Slide at Table Top - 2-3 x daily - 7 x weekly - 1 sets - 10 reps - 5-10 seocnds hold Seated Shoulder External Rotation PROM on Table - 2-3 x daily - 7 x weekly - 1 sets - 10 reps - 5-10 seconds hold Seated Scapular Retraction - 2-3 x daily - 7 x weekly - 1 sets - 10 reps - 5-10 seconds hold     ASSESSMENT:   CLINICAL IMPRESSION:  11/16/2021:  Pt arriving to therapy reporting a good CT scan report. Pt also reporting she is now able to reach the side of her head for washing her hair. Pt still with pain reported with end ranges of PROM and active motion of her right shoulder. Continue skilled PT to maximize pt's function.    11/09/2021:  Pt arriving today reporting increased pain of 8/10 at times in her right shoulder. Pt stating she received an injection from Dr. Durward Fortes last week  on 11/01/2021. Pt stating it helped for a few days but pt feels now her pain returned again yesterday. Pt stating she was sick from her infusion on Thursday. Pt stating she has been taking over the counter pain meds as needed. Pt is now able to reach the left side of her face using her right UE. Pt tolerating exercises with pain reported at end range movements in right shoulder. Continue skilled PT to maximize function.    2/13/ 2023: Pt arriving today reporting 4/10 pain in her right shoulder. Pt reporting "popping" sensation noted around posterior shoulder. Pt with 3 episodes of the "popping" during therapy session with exercises. Pt was instructed to focus on more gentle stretching for her HEP. Pt feels she may have over done it over the weekend added some of her own exercises to her HEP and added stretching due to decreased pain. Pt reporting  feeling better after gentle mobs and moist heat treatment. Continue skilled PT to maximize function.     REHAB POTENTIAL: fair, due to co-morbidities   CLINICAL DECISION MAKING: Evolving/moderate complexity   EVALUATION COMPLEXITY: Moderate     GOALS: Goals reviewed with patient? Yes   SHORT TERM GOALS:   STG Name Target Date Goal status  1 Pt will be independent with her initial HEP.    11/16/2021 Met 11/09/2021  2 Pt will be able to perform 5 time sit to stand with no UE support </= 15 seconds Baseline:  11/16/2021 Ongoing 11/16/2021                                                 LONG TERM GOALS:    LTG Name Target Date Goal status  1 PT will be independent in her advanced HEP.  Baseline: 01/11/2022 Ongoing  2 Pt will improve FOTO score to >/= 63 % Baseline: 51% on 10/19/2021 01/11/2022 Ongoing  3 Pt will improve her right shoulder ER to >/= 30 degrees to improve functional mobility.  Baseline: 01/11/2022 Ongoing  4 Pt will be able to report pain of </= 2/10 with ADL's using her right UE.  Baseline: 01/11/2022 Ongoing  PLAN: PT FREQUENCY: 1x/ week for 12 weeks   PT DURATION: 12 weeks   PLANNED INTERVENTIONS: Therapeutic exercises, Therapeutic activity, Neuro Muscular re-education, Balance training, Gait training, Patient/Family education, Joint mobilization, Dry Needling, Cryotherapy, Moist heat, Taping, Vasopneumatic device, and Manual therapy   PLAN FOR NEXT SESSION:  shoulder ROM, mobs, PROM, gentle stretching and strengthening as tolerated, UBE as tolerated      Oretha Caprice, PT, MPT 11/16/2021, 3:21 PM

## 2021-11-17 ENCOUNTER — Inpatient Hospital Stay: Payer: Medicare HMO

## 2021-11-17 ENCOUNTER — Inpatient Hospital Stay: Payer: Medicare HMO | Attending: Nurse Practitioner

## 2021-11-17 ENCOUNTER — Inpatient Hospital Stay: Payer: Medicare HMO | Admitting: Oncology

## 2021-11-17 ENCOUNTER — Encounter: Payer: Self-pay | Admitting: *Deleted

## 2021-11-17 VITALS — BP 134/79 | HR 84 | Temp 98.1°F | Resp 18 | Ht 62.0 in | Wt 156.6 lb

## 2021-11-17 DIAGNOSIS — Z79899 Other long term (current) drug therapy: Secondary | ICD-10-CM | POA: Insufficient documentation

## 2021-11-17 DIAGNOSIS — C772 Secondary and unspecified malignant neoplasm of intra-abdominal lymph nodes: Secondary | ICD-10-CM

## 2021-11-17 DIAGNOSIS — Z5112 Encounter for antineoplastic immunotherapy: Secondary | ICD-10-CM | POA: Insufficient documentation

## 2021-11-17 DIAGNOSIS — C210 Malignant neoplasm of anus, unspecified: Secondary | ICD-10-CM

## 2021-11-17 LAB — CMP (CANCER CENTER ONLY)
ALT: 14 U/L (ref 0–44)
AST: 23 U/L (ref 15–41)
Albumin: 3.7 g/dL (ref 3.5–5.0)
Alkaline Phosphatase: 70 U/L (ref 38–126)
Anion gap: 10 (ref 5–15)
BUN: 27 mg/dL — ABNORMAL HIGH (ref 8–23)
CO2: 21 mmol/L — ABNORMAL LOW (ref 22–32)
Calcium: 9.4 mg/dL (ref 8.9–10.3)
Chloride: 106 mmol/L (ref 98–111)
Creatinine: 0.9 mg/dL (ref 0.44–1.00)
GFR, Estimated: >60 mL/min (ref >60)
Glucose, Bld: 90 mg/dL (ref 70–99)
Potassium: 4.4 mmol/L (ref 3.5–5.1)
Sodium: 137 mmol/L (ref 135–145)
Total Bilirubin: 0.3 mg/dL (ref 0.3–1.2)
Total Protein: 7.2 g/dL (ref 6.5–8.1)

## 2021-11-17 LAB — CBC WITH DIFFERENTIAL (CANCER CENTER ONLY)
Abs Immature Granulocytes: 0.01 10*3/uL (ref 0.00–0.07)
Basophils Absolute: 0 10*3/uL (ref 0.0–0.1)
Basophils Relative: 1 %
Eosinophils Absolute: 0.1 10*3/uL (ref 0.0–0.5)
Eosinophils Relative: 3 %
HCT: 36.2 % (ref 36.0–46.0)
Hemoglobin: 11.3 g/dL — ABNORMAL LOW (ref 12.0–15.0)
Immature Granulocytes: 0 %
Lymphocytes Relative: 10 %
Lymphs Abs: 0.5 10*3/uL — ABNORMAL LOW (ref 0.7–4.0)
MCH: 27.3 pg (ref 26.0–34.0)
MCHC: 31.2 g/dL (ref 30.0–36.0)
MCV: 87.4 fL (ref 80.0–100.0)
Monocytes Absolute: 0.4 10*3/uL (ref 0.1–1.0)
Monocytes Relative: 7 %
Neutro Abs: 4 10*3/uL (ref 1.7–7.7)
Neutrophils Relative %: 79 %
Platelet Count: 201 10*3/uL (ref 150–400)
RBC: 4.14 MIL/uL (ref 3.87–5.11)
RDW: 16.3 % — ABNORMAL HIGH (ref 11.5–15.5)
WBC Count: 5.1 10*3/uL (ref 4.0–10.5)
nRBC: 0 % (ref 0.0–0.2)

## 2021-11-17 MED ORDER — SODIUM CHLORIDE 0.9 % IV SOLN: Freq: Once | INTRAVENOUS | Status: AC

## 2021-11-17 MED ORDER — SODIUM CHLORIDE 0.9 % IV SOLN
240.0000 mg | Freq: Once | INTRAVENOUS | Status: AC
  Administered 2021-11-17: 240 mg via INTRAVENOUS
  Filled 2021-11-17: qty 24

## 2021-11-17 NOTE — Progress Notes (Signed)
?[place] ?OFFICE PROGRESS NOTE ? ? ?Diagnosis: Anal cancer ? ?INTERVAL HISTORY:  ? ?[place] completed another treat with nivolumab on 11/04/2021.  No rash or diarrhea.  She has occasional rectal bleeding when she is constipated.  She continues to have mild edema in the legs and discomfort at the bilateral lower leg.  She is working.  Good appetite. ? ?Objective: ? ?Vital signs in last 24 hours: ? ?Blood pressure 134/79, pulse 84, temperature 98.1 ?F (36.7 ?C), temperature source Oral, resp. rate 18, height 5\' 2"  (1.575 m), weight 156 lb 9.6 oz (71 kg), SpO2 100 %. ?  ? ?Lymphatics: No cervical, supraclavicular, axillary, or inguinal nodes ?Resp: Scattered end inspiratory rhonchi, no respiratory distress ?Cardio: Regular rate and rhythm ?GI: No hepatosplenomegaly ?Vascular: Mild lower leg edema bilaterally ? ? ?Lab Results: ? ?Lab Results  ?Component Value Date  ? WBC 5.1 11/17/2021  ? HGB 11.3 (L) 11/17/2021  ? HCT 36.2 11/17/2021  ? MCV 87.4 11/17/2021  ? PLT 201 11/17/2021  ? NEUTROABS 4.0 11/17/2021  ? ? ?CMP  ?Lab Results  ?Component Value Date  ? NA 137 11/17/2021  ? K 4.4 11/17/2021  ? CL 106 11/17/2021  ? CO2 21 (L) 11/17/2021  ? GLUCOSE 90 11/17/2021  ? BUN 27 (H) 11/17/2021  ? CREATININE 0.90 11/17/2021  ? CALCIUM 9.4 11/17/2021  ? PROT 7.2 11/17/2021  ? ALBUMIN 3.7 11/17/2021  ? AST 23 11/17/2021  ? ALT 14 11/17/2021  ? ALKPHOS 70 11/17/2021  ? BILITOT 0.3 11/17/2021  ? GFRNONAA >60 11/17/2021  ? GFRAA >60 06/17/2020  ? ? ?Lab Results  ?Component Value Date  ? CEA1 1.15 03/04/2021  ? CEA 1.16 03/04/2021  ? ? ?Lab Results  ?Component Value Date  ? INR 1.2 05/16/2021  ? LABPROT 15.2 05/16/2021  ? ? ?Imaging: ? ?No results found. ? ?Medications: I have reviewed the patient's current medications. ? ? ?Assessment/Plan: ? ?Anal cancer ?CT abdomen/pelvis 10/21/2016-thickening of the anus extending to the junction between the anus and rectum with a large stool ball in the rectum and mild fat  stranding posterior to the rectum. Enlarged lymph nodes in the right and left inguinal regions. A few mildly prominent nodes seen posterior to the rectum. 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 ?PET scan [id number] hypermetabolic anal mass with hypermetabolic metastases to the groin region bilaterally, left pelvic sidewall and presacral space. ?Initiation of radiation and cycle 1 5-FU/mitomycin C 11/14/2016 ?Cycle 2 5-FU/mitomycin C 12/12/2016 (5-FU dose reduced due to mucositis, diarrhea, skin breakdown) ?Radiation completed 12/23/2016 ?CT abdomen/pelvis 04/14/2017-resolution of anal mass and bilateral inguinal lymphadenopathy. No residual tumor seen. ?CT abdomen/pelvis 01/28/2020-right hydroureteronephrosis, transition at the level of the mid right ureter, retroperitoneal lymphadenopathy ?CT renal stone study 01/28/2020-severe right hydronephrosis and proximal right hydroureter, 8 mm area of increased attenuation in the proximal right ureter-stone versus soft tissue mass ?PET 02/13/2020-hypermetabolic right retroperitoneal nodal metastases in the aortocaval and posterior pericaval chains, nonspecific mild anal wall hypermetabolism, right nephroureteral stent ?02/21/2020 anorectal exam per Dr. [place] Jacobs radiation changes of the skin around the anal margin.  Posterior midline smooth scarring.  Mildly stenotic.  Stenosis seems better.  No palpable concerning lesions.  Entire anal canal and distal rectum feels smooth and healthy.  Partial anoscopy performed with no lesions in the anal canal. ?Xeloda/radiation beginning 03/02/2020, completed 04/13/2020 ?CT abdomen/pelvis 06/17/2020-resolution of right retroperitoneal lymphadenopathy, no remaining pathologically enlarged lymph nodes, residual mild right hydronephrosis, no evidence of progressive disease, stable anal  wall thickening ?Digital rectal exam by Dr. Quentin Cornwall 08/26/2020-posterior midline smooth scarring.  Mildly stenotic.   Stenosis seems better.  No palpable concerning lesions.  Anal canal feels smooth without palpable concern.  Unable to tolerate anoscopy.  Next visit in 6 months for surveillance. ?CT abdomen/pelvis 10/28/2020-no evidence of local recurrence or metastatic disease.  Stable mild rectal wall thickening.  New diffuse bladder wall thickening possibly related to prior radiation ?CT abdomen/pelvis 01/16/2021- recurrent right-sided hydronephrosis and proximal right hydroureter status post removal of right ureter stent, new soft tissue nodule along the course of the right ureter between the right psoas and IVC suspicious for recurrent lymphadenopathy ?PET 02/12/2021-small focus of hypermetabolism at the anorectal junction without a definable mass, right retroperitoneal nodal metastasis with obstruction of the proximal right ureter, right middle lobe hypermetabolic nodule, hypermetabolism in the right hilum without a defined nodal mass, hypermetabolic left supraclavicular and left axillary nodes ?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 ?Cycle 1 Taxol/carboplatin 03/11/2021 ?Cycle 2 Taxol/carboplatin 03/31/2021, Fulphila ?Cycle 3 Taxol/carboplatin 04/21/2021, Fulphila ?CTs 05/11/2021-decrease size of FDG avid nodule in right middle lobe, right middle lobe subpleural nodule slightly enlarged, decreased in size of left axillary and subpectoral nodes, unchanged FDG avid right iliac nodes ?Cycle 4 Taxol/carboplatin 05/12/2021, G-CSF discontinued secondary to poor tolerance, Taxol and carboplatin dose reduced ?Cycle 5 Taxol/carboplatin 06/02/2021 ?Cycle 6 carboplatin 07/01/2021, Taxol held due to neuropathy ?Cycle 7 carboplatin 07/29/2021, Taxol held due to neuropathy ?CTs 08/16/2021-new liver lesion, increase in right psoas lymph node, unchanged right hydronephrosis, new mediastinal and right hilar lymphadenopathy, enlargement of bilateral pulmonary nodules and at least one new  nodule, new subcarinal node ?Cycle 1 nivolumab 08/25/2021 ?Cycle 2 nivolumab 09/08/2021 ?Cycle 3 nivolumab 09/22/2021 ?Cycle 4 nivolumab 10/06/2021 ?Cycle 5 nivolumab 10/20/2021 ?Cycle 6 nivolumab 11/04/2021 ?CTs 11/12/2021-resolution of lung nodules, decreased size of right psoas node, no remaining mediastinal lymphadenopathy, resolution of right liver lesion ?Cycle 7 nivolumab 11/17/2021 ?Left labial lesions. Question direct extension from the anal cancer versus metastatic disease from anal cancer versus a separate malignant process. 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 ?Multiple family members with breast cancer. ?History of hypokalemia-likely secondary to decreased nutritional intake and diarrhea; potassium in normal range 01/16/2017. No longer taking a potassium supplement. ?Right hydroureteronephrosis on CT 01/28/2020, status post a cystoscopy/pyelogram 01/28/2020 confirming extrinsic compression of the right ureter, status post stent placement ?Right ureter stent exchange 06/12/2020 ?Right ureter stent removed 01/13/2021 ?  ?8.  Admission 01/07/2021 with Klebsiella pneumoniae urosepsis/bacteremia  ?9.  Hospital admission 05/15/2021 with UTI/possible right pyelonephritis ?  ? ? ?Disposition: ?[place] Moore has completed 6 treatments with nivolumab.  She has tolerated the treatment well.  There has been clinical and radiologic improvement.  I reviewed the CT findings and images with her. ?The plan is to continue nivolumab.  She will complete another treatment today.  Discussed changing to a 4-week nivolumab scheduled.  She prefers to continue treatment on the 2-week schedule. ? ?[place] will return for an office visit and nivolumab in 2 weeks ? ?Betsy Coder, MD ? ?11/17/2021  ?12:37 PM ? ? ?

## 2021-11-17 NOTE — Progress Notes (Signed)
Patient seen by Dr. Sherrill today ? ?Vitals are within treatment parameters. ? ?Labs reviewed by Dr. Sherrill and are within treatment parameters. ? ?Per physician team, patient is ready for treatment and there are NO modifications to the treatment plan.  ?

## 2021-11-17 NOTE — Patient Instructions (Signed)
[facility]   Discharge Instructions: Thank you for choosing [facility] to provide your oncology and hematology care.   If you have a lab appointment with the [facility], please go directly to the [facility] and check in at the registration area.   Wear comfortable clothing and clothing appropriate for easy access to any Portacath or PICC line.   We strive to give you quality time with your provider. You may need to reschedule your appointment if you arrive late (15 or more minutes).  Arriving late affects you and other patients whose appointments are after yours.  Also, if you miss three or more appointments without notifying the office, you may be dismissed from the clinic at the providers discretion.      For prescription refill requests, have your pharmacy contact our office and allow 72 hours for refills to be completed.    Today you received the following chemotherapy and/or immunotherapy agents Nivolumab (OPDIVO).      To help prevent nausea and vomiting after your treatment, we encourage you to take your nausea medication as directed.  BELOW ARE SYMPTOMS THAT SHOULD BE REPORTED IMMEDIATELY: *FEVER GREATER THAN 100.4 F (38 C) OR HIGHER *CHILLS OR SWEATING *NAUSEA AND VOMITING THAT IS NOT CONTROLLED WITH YOUR NAUSEA MEDICATION *UNUSUAL SHORTNESS OF BREATH *UNUSUAL BRUISING OR BLEEDING *URINARY PROBLEMS (pain or burning when urinating, or frequent urination) *BOWEL PROBLEMS (unusual diarrhea, constipation, pain near the anus) TENDERNESS IN MOUTH AND THROAT WITH OR WITHOUT PRESENCE OF ULCERS (sore throat, sores in mouth, or a toothache) UNUSUAL RASH, SWELLING OR PAIN  UNUSUAL VAGINAL DISCHARGE OR ITCHING   Items with * indicate a potential emergency and should be followed up as soon as possible or go to the Emergency Department if any problems should occur.  Please show the CHEMOTHERAPY ALERT CARD or IMMUNOTHERAPY ALERT CARD at  check-in to the Emergency Department and triage nurse.  Should you have questions after your visit or need to cancel or reschedule your appointment, please contact [facility]  Dept: [phone]  and follow the prompts.  Office hours are 8:00 a.m. to 4:30 p.m. Monday - Friday. Please note that voicemails left after 4:00 p.m. may not be returned until the following business day.  We are closed weekends and major holidays. You have access to a nurse at all times for urgent questions. Please call the main number to the clinic Dept: [phone] and follow the prompts.   For any non-urgent questions, you may also contact your provider using MyChart. We now offer e-Visits for anyone 73 and older to request care online for non-urgent symptoms. For details visit [email or web address].   Also download the MyChart app! Go to the app store, search "MyChart", open the app, select [facility], and log in with your MyChart username and password.  Due to Covid, a mask is required upon entering the hospital/clinic. If you do not have a mask, one will be given to you upon arrival. For doctor visits, patients may have 1 support person aged 73 or older with them. For treatment visits, patients cannot have anyone with them due to current Covid guidelines and our immunocompromised population.   Nivolumab injection What is this medication? NIVOLUMAB (nye VOL ue mab) is a monoclonal antibody. It treats certain types of cancer. Some of the cancers treated are colon cancer, head and neck cancer, Hodgkin lymphoma, lung cancer, and melanoma. This medicine may be used for other purposes;  ask your health care provider or pharmacist if you have questions. COMMON BRAND NAME(S): Opdivo What should I tell my care team before I take this medication? They need to know if you have any of these conditions: Autoimmune diseases such as Crohn's disease, ulcerative colitis, or lupus Have had or planning to  have an allogeneic stem cell transplant (uses someone else's stem cells) History of chest radiation Organ transplant Nervous system problems such as myasthenia gravis or Guillain-Barre syndrome An unusual or allergic reaction to nivolumab, other medicines, foods, dyes, or preservatives Pregnant or trying to get pregnant Breast-feeding How should I use this medication? This medication is injected into a vein. It is given in a hospital or clinic setting. A special MedGuide will be given to you before each treatment. Be sure to read this information carefully each time. Talk to your care team regarding the use of this medication in children. While it may be prescribed for children as young as 12 years for selected conditions, precautions do apply. Overdosage: If you think you have taken too much of this medicine contact a poison control center or emergency room at once. NOTE: This medicine is only for you. Do not share this medicine with others. What if I miss a dose? Keep appointments for follow-up doses. It is important not to miss your dose. Call your care team if you are unable to keep an appointment. What may interact with this medication? Interactions have not been studied. This list may not describe all possible interactions. Give your health care provider a list of all the medicines, herbs, non-prescription drugs, or dietary supplements you use. Also tell them if you smoke, drink alcohol, or use illegal drugs. Some items may interact with your medicine. What should I watch for while using this medication? Your condition will be monitored carefully while you are receiving this medication. You may need blood work done while you are taking this medication. Do not become pregnant while taking this medication or for 5 months after stopping it. Women should inform their care team if they wish to become pregnant or think they might be pregnant. There is a potential for serious harm to an unborn  child. Talk to your care team for more information. Do not breast-feed an infant while taking this medication or for 5 months after stopping it. What side effects may I notice from receiving this medication? Side effects that you should report to your care team as soon as possible: Allergic reactions--skin rash, itching, hives, swelling of the face, lips, tongue, or throat Bloody or black, tar-like stools Change in vision Chest pain Diarrhea Dry cough, shortness of breath or trouble breathing Eye pain Fast or irregular heartbeat Fever, chills High blood sugar (hyperglycemia)--increased thirst or amount of urine, unusual weakness or fatigue, blurry vision High thyroid levels (hyperthyroidism)--fast or irregular heartbeat, weight loss, excessive sweating or sensitivity to heat, tremors or shaking, anxiety, nervousness, irregular menstrual cycle or spotting Kidney injury--decrease in the amount of urine, swelling of the ankles, hands, or feet Liver injury--right upper belly pain, loss of appetite, nausea, light-colored stool, dark yellow or brown urine, yellowing skin or eyes, unusual weakness or fatigue Low red blood cell count--unusual weakness or fatigue, dizziness, headache, trouble breathing Low thyroid levels (hypothyroidism)--unusual weakness or fatigue, increased sensitivity to cold, constipation, hair loss, dry skin, weight gain, feelings of depression Mood and behavior changes-confusion, change in sex drive or performance, irritability Muscle pain or cramps Pain, tingling, or numbness in the hands or feet, muscle  weakness, trouble walking, loss of balance or coordination Red or dark brown urine Redness, blistering, peeling, or loosening of the skin, including inside the mouth Stomach pain Unusual bruising or bleeding Side effects that usually do not require medical attention (report to your care team if they continue or are bothersome): Bone pain Constipation Loss of  appetite Nausea Tiredness Vomiting This list may not describe all possible side effects. Call your doctor for medical advice about side effects. You may report side effects to FDA at 1-800-FDA-1088. Where should I keep my medication? This medication is given in a hospital or clinic and will not be stored at home. NOTE: This sheet is a summary. It may not cover all possible information. If you have questions about this medicine, talk to your doctor, pharmacist, or health care provider.  2022 Elsevier/Gold Standard (2021-05-25 00:00:00)

## 2021-11-17 NOTE — Progress Notes (Signed)
Patient presents for treatment. RN assessment completed along with the following: ? ?Labs/vitals reviewed - Yes, and Please see collab nurse note    ?Weight within 10% of previous measurement - Yes ?Informed consent completed and reflects current therapy/intent - Yes, on date 08/25/2021             ?Provider progress note reviewed - Today's provider note is not yet available. I reviewed the most recent oncology provider progress note in chart dated 11/04/2021. ?Treatment/Antibody/Supportive plan reviewed - Yes, and there are no adjustments needed for today's treatment. ?S&H and other orders reviewed - Yes, and there are no additional orders identified. ?Previous treatment date reviewed - Yes, and the appropriate amount of time has elapsed between treatments. ?Clinic Hand Off Received from - Yes from [facility] [place], RN ? ?Patient to proceed with treatment.  ? ?

## 2021-11-18 ENCOUNTER — Ambulatory Visit (INDEPENDENT_AMBULATORY_CARE_PROVIDER_SITE_OTHER): Payer: Medicare HMO

## 2021-11-18 VITALS — Ht 61.0 in | Wt 156.0 lb

## 2021-11-18 DIAGNOSIS — Z0000 Encounter for general adult medical examination without abnormal findings: Secondary | ICD-10-CM

## 2021-11-18 NOTE — Patient Instructions (Addendum)
Ashley Moore , Thank you for taking time to come for your Medicare Wellness Visit. I appreciate your ongoing commitment to your health goals. Please review the following plan we discussed and let me know if I can assist you in the future.   These are the goals we discussed:  Goals      Exercise 150 minutes per week (moderate activity)     Staying alive     Patient Stated     I plan to go camping in my new mini [employer]!         This is a list of the screening recommended for you and due dates:  Health Maintenance  Topic Date Due   Colon Cancer Screening  04/24/2022*   COVID-19 Vaccine (4 - Booster for Pfizer series) 04/24/2022*   Zoster (Shingles) Vaccine (1 of 2) 04/24/2022*   Mammogram  06/16/2022   Tetanus Vaccine  02/14/2023   Pneumonia Vaccine  Completed   Flu Shot  Completed   DEXA scan (bone density measurement)  Completed   Hepatitis C Screening: USPSTF Recommendation to screen - Ages 26-79 yo.  Completed   HPV Vaccine  Aged Out  *Topic was postponed. The date shown is not the original due date.   Advanced directives: Yes   Conditions/risks identified: None  Next appointment: Follow up in one year for your annual wellness visit    Preventive Care 65 Years and Older, Female Preventive care refers to lifestyle choices and visits with your health care provider that can promote health and wellness. What does preventive care include? A yearly physical exam. This is also called an annual well check. Dental exams once or twice a year. Routine eye exams. Ask your health care provider how often you should have your eyes checked. Personal lifestyle choices, including: Daily care of your teeth and gums. Regular physical activity. Eating a healthy diet. Avoiding tobacco and drug use. Limiting alcohol use. Practicing safe sex. Taking low-dose aspirin every day. Taking vitamin and mineral supplements as recommended by your health care provider. What happens during an annual  well check? The services and screenings done by your health care provider during your annual well check will depend on your age, overall health, lifestyle risk factors, and family history of disease. Counseling  Your health care provider may ask you questions about your: Alcohol use. Tobacco use. Drug use. Emotional well-being. Home and relationship well-being. Sexual activity. Eating habits. History of falls. Memory and ability to understand (cognition). Work and work Statistician. Reproductive health. Screening  You may have the following tests or measurements: Height, weight, and BMI. Blood pressure. Lipid and cholesterol levels. These may be checked every 5 years, or more frequently if you are over 28 years old. Skin check. Lung cancer screening. You may have this screening every year starting at age 74 if you have a 30-pack-year history of smoking and currently smoke or have quit within the past 15 years. Fecal occult blood test (FOBT) of the stool. You may have this test every year starting at age 75. Flexible sigmoidoscopy or colonoscopy. You may have a sigmoidoscopy every 5 years or a colonoscopy every 10 years starting at age 61. Hepatitis C blood test. Hepatitis B blood test. Sexually transmitted disease (STD) testing. Diabetes screening. This is done by checking your blood sugar (glucose) after you have not eaten for a while (fasting). You may have this done every 1-3 years. Bone density scan. This is done to screen for osteoporosis. You may have this  done starting at age 68. Mammogram. This may be done every 1-2 years. Talk to your health care provider about how often you should have regular mammograms. Talk with your health care provider about your test results, treatment options, and if necessary, the need for more tests. Vaccines  Your health care provider may recommend certain vaccines, such as: Influenza vaccine. This is recommended every year. Tetanus, diphtheria,  and acellular pertussis (Tdap, Td) vaccine. You may need a Td booster every 10 years. Zoster vaccine. You may need this after age 77. Pneumococcal 13-valent conjugate (PCV13) vaccine. One dose is recommended after age 22. Pneumococcal polysaccharide (PPSV23) vaccine. One dose is recommended after age 21. Talk to your health care provider about which screenings and vaccines you need and how often you need them. This information is not intended to replace advice given to you by your health care provider. Make sure you discuss any questions you have with your health care provider. Document Released: 10/02/2015 Document Revised: 05/25/2016 Document Reviewed: 07/07/2015 Elsevier Interactive Patient Education  2017 [place] Prevention in the Home Falls can cause injuries. They can happen to people of all ages. There are many things you can do to make your home safe and to help prevent falls. What can I do on the outside of my home? Regularly fix the edges of walkways and driveways and fix any cracks. Remove anything that might make you trip as you walk through a door, such as a raised step or threshold. Trim any bushes or trees on the path to your home. Use bright outdoor lighting. Clear any walking paths of anything that might make someone trip, such as rocks or tools. Regularly check to see if handrails are loose or broken. Make sure that both sides of any steps have handrails. Any raised decks and porches should have guardrails on the edges. Have any leaves, snow, or ice cleared regularly. Use sand or salt on walking paths during winter. Clean up any spills in your garage right away. This includes oil or grease spills. What can I do in the bathroom? Use night lights. Install grab bars by the toilet and in the tub and shower. Do not use towel bars as grab bars. Use non-skid mats or decals in the tub or shower. If you need to sit down in the shower, use a plastic, non-slip  stool. Keep the floor dry. Clean up any water that spills on the floor as soon as it happens. Remove soap buildup in the tub or shower regularly. Attach bath mats securely with double-sided non-slip rug tape. Do not have throw rugs and other things on the floor that can make you trip. What can I do in the bedroom? Use night lights. Make sure that you have a light by your bed that is easy to reach. Do not use any sheets or blankets that are too big for your bed. They should not hang down onto the floor. Have a firm chair that has side arms. You can use this for support while you get dressed. Do not have throw rugs and other things on the floor that can make you trip. What can I do in the kitchen? Clean up any spills right away. Avoid walking on wet floors. Keep items that you use a lot in easy-to-reach places. If you need to reach something above you, use a strong step stool that has a grab bar. Keep electrical cords out of the way. Do not use floor polish or wax  that makes floors slippery. If you must use wax, use non-skid floor wax. Do not have throw rugs and other things on the floor that can make you trip. What can I do with my stairs? Do not leave any items on the stairs. Make sure that there are handrails on both sides of the stairs and use them. Fix handrails that are broken or loose. Make sure that handrails are as long as the stairways. Check any carpeting to make sure that it is firmly attached to the stairs. Fix any carpet that is loose or worn. Avoid having throw rugs at the top or bottom of the stairs. If you do have throw rugs, attach them to the floor with carpet tape. Make sure that you have a light switch at the top of the stairs and the bottom of the stairs. If you do not have them, ask someone to add them for you. What else can I do to help prevent falls? Wear shoes that: Do not have high heels. Have rubber bottoms. Are comfortable and fit you well. Are closed at the  toe. Do not wear sandals. If you use a stepladder: Make sure that it is fully opened. Do not climb a closed stepladder. Make sure that both sides of the stepladder are locked into place. Ask someone to hold it for you, if possible. Clearly mark and make sure that you can see: Any grab bars or handrails. First and last steps. Where the edge of each step is. Use tools that help you move around (mobility aids) if they are needed. These include: Canes. Walkers. Scooters. Crutches. Turn on the lights when you go into a dark area. Replace any light bulbs as soon as they burn out. Set up your furniture so you have a clear path. Avoid moving your furniture around. If any of your floors are uneven, fix them. If there are any pets around you, be aware of where they are. Review your medicines with your doctor. Some medicines can make you feel dizzy. This can increase your chance of falling. Ask your doctor what other things that you can do to help prevent falls. This information is not intended to replace advice given to you by your health care provider. Make sure you discuss any questions you have with your health care provider. Document Released: 07/02/2009 Document Revised: 02/11/2016 Document Reviewed: 10/10/2014 Elsevier Interactive Patient Education  2017 [employer].

## 2021-11-18 NOTE — Progress Notes (Signed)
Subjective:   Ashley Moore is a 71 y.o. female who presents for Medicare Annual (Subsequent) preventive examination.  Review of Systems    Virtual Visit via Telephone Note  I connected with  Reva Bores on 11/18/21 at  2:45 PM EST by telephone and verified that I am speaking with the correct person using two identifiers.  Location: Patient: Home Provider: Office Persons participating in the virtual visit: patient/Nurse Health Advisor   I discussed the limitations, risks, security and privacy concerns of performing an evaluation and management service by telephone and the availability of in person appointments. The patient expressed understanding and agreed to proceed.  Interactive audio and video telecommunications were attempted between this nurse and patient, however failed, due to patient having technical difficulties OR patient did not have access to video capability.  We continued and completed visit with audio only.  Some vital signs may be absent or patient reported.   Criselda Peaches, LPN  Cardiac Risk Factors include: advanced age (>49men, >81 women)     Objective:    Today's Vitals   11/18/21 1450  Weight: 156 lb (70.8 kg)  Height: 5\' 1"  (1.549 m)   Body mass index is 29.48 kg/m.  Advanced Directives 11/18/2021 11/17/2021 11/04/2021 10/20/2021 10/20/2021 10/19/2021 10/06/2021  Does Patient Have a Medical Advance Directive? Yes Yes Yes Yes Yes Yes Yes  Type of Paramedic of [place];Living will [place];Living will [place];Living will [place];Living will Living will;Healthcare Power of [place];Living will [place];Living will  Does patient want to make changes to medical advance directive? No - Patient declined No - Patient declined No - Patient declined No - Patient declined No - Patient declined No - Patient declined No -  Patient declined  Copy of [place] in Chart? No - copy requested - - - - - -  Would patient like information on creating a medical advance directive? - - - - No - Patient declined - -    Current Medications (verified) Outpatient Encounter Medications as of 11/18/2021  Medication Sig   acetaminophen (TYLENOL) 500 MG tablet Take 1,000 mg by mouth 2 (two) times daily.   Ascorbic Acid (VITAMIN C) 1000 MG tablet Take 1,000 mg by mouth daily.   azelastine (ASTELIN) 0.1 % nasal spray Place 2 sprays into both nostrils 2 (two) times daily. Use in each nostril as directed   Cholecalciferol (VITAMIN D3 PO) Take 10,000 Units by mouth daily.   conjugated estrogens (PREMARIN) vaginal cream Place 1 Applicatorful vaginally daily.   diclofenac Sodium (VOLTAREN) 1 % GEL Apply 2 g topically daily. To shoulder   levothyroxine (SYNTHROID) 75 MCG tablet Take 1 tablet (75 mcg total) by mouth daily.   loratadine (CLARITIN) 10 MG tablet Take 10 mg by mouth daily.   LORazepam (ATIVAN) 0.5 MG tablet Take 0.5 mg by mouth as needed for anxiety (as needed).   MYRBETRIQ 50 MG TB24 tablet Take 50 mg by mouth daily.   OVER THE COUNTER MEDICATION Place 1 drop into both eyes daily as needed (dry eyes).   polyethylene glycol (MIRALAX) 17 g packet Take 17 g by mouth daily as needed for mild constipation. (Patient taking differently: Take 17 g by mouth daily.)   Prenatal Vit-Fe Fumarate-FA (PRENATAL PO) Take by mouth daily.   protein supplement shake (PREMIER PROTEIN) LIQD Take 11 oz by mouth daily.   No facility-administered encounter  medications on file as of 11/18/2021.    Allergies (verified) Sulfamethoxazole-trimethoprim, Benadryl [diphenhydramine], Melatonin, Other, and Penicillins   History: Past Medical History:  Diagnosis Date   Arthritis    Bowen's disease    excised 1992   Chronic low back pain    Chronic radiation cystitis    DDD (degenerative disc disease), cervical    Family history of  breast cancer    Family history of lung cancer    Family history of non-Hodgkin's lymphoma    Family history of ovarian cancer    Headache    Hx of varicella    Hydronephrosis of right kidney    urologist--- dr Tresa Moore,  malignant,  treated with ureter stent   Hypothyroidism    followed by pcp   Lower urinary tract symptoms (LUTS)    01-12-2021  per pt treated with accupuncture at dr Tresa Moore office   Lymphedema of both lower extremities    PICC (peripherally inserted central catheter) in place 01/11/2021   placed at [facility] in [place], [place] for IV antibiotics   Positive blood culture 01/08/2021   Klebsiella Pneumaniae,  treated with daily IV antibiotic   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--- dr Benay Spice   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,  invasive SCC , completed chemo/ radiation 12-23-2016;  recurrent metastatsis retroperitoneal lymphdenopathy,  completed radiation 04-13-2020 residual right ureteral uropathy obstruction   Retroperitoneal lymphadenopathy    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   Sepsis due to Klebsiella pneumoniae ([place]) 01/07/2021   pt admitted to [facility] in [place],  dx sepsis secondary to acute pyelonephritis with bacteremia , positive blood culture,  discharged 01-11-2021 home daily IV antibiotic   Wears glasses    Past Surgical History:  Procedure Laterality Date   BUNIONECTOMY Right yrs ago   [place] Right 06/12/2020   Procedure: CYSTOSCOPY WITH RETROGRADE PYELOGRAM/URETERAL STENT EXCHANGE;  Surgeon: Alexis Frock, MD;  Location: [facility];  Service: Urology;  Laterality: Right;   CYSTOSCOPY W/ URETERAL STENT PLACEMENT Right 01/13/2021   Procedure: CYSTOSCOPY WITH RETROGRADE PYELOGRAM/URETERAL STENT REMOVALRIGHT;  Surgeon: Alexis Frock, MD;  Location: [facility];  Service: Urology;  Laterality: Right;  [place],  URETEROSCOPY AND STENT PLACEMENT Right 01/28/2020   Procedure: CYSTOSCOPY WITH RETROGRADE PYELOGRAM, URETEROSCOPY AND STENT PLACEMENT;  Surgeon: Alexis Frock, MD;  Location: [facility];  Service: Urology;  Laterality: Right;   IR GENERIC HISTORICAL  11/14/2016   IR US GUIDE VASC ACCESS RIGHT 11/14/2016 [facility]   IR GENERIC HISTORICAL  11/14/2016   IR FLUORO GUIDE CV LINE RIGHT 11/14/2016 [facility]   IR GENERIC HISTORICAL  12/12/2016   IR US GUIDE VASC ACCESS RIGHT 12/12/2016 [name], MD [facility]   IR GENERIC HISTORICAL  12/12/2016   IR FLUORO GUIDE CV LINE RIGHT 12/12/2016 [name], MD [facility]   SKIN CANCER EXCISION  1990   bowens disease   Family History  Problem Relation Age of Onset   Lung cancer Mother        lung   Breast cancer Mother 26   Ovarian cancer Mother 25   Pancreatitis Father    Breast cancer Sister 1   Breast cancer Maternal Grandmother 22       breast    Breast cancer Maternal Aunt 54       breast   Melanoma Sister    Breast cancer Sister 62  Non-Hodgkin's lymphoma Paternal Grandmother    Social History   Socioeconomic History   Marital status: Widowed    Spouse name: Not on file   Number of children: 0   Years of education: 14   Highest education level: Associate degree: academic program  Occupational History   Occupation: self employed    Comment: full time Chief of Staff  Tobacco Use   Smoking status: Former    Packs/day: 1.00    Years: 25.00    Pack years: 25.00    Types: Cigarettes    Quit date: 06/20/1995    Years since quitting: 26.4   Smokeless tobacco: Never  Vaping Use   Vaping Use: Never used  Substance and Sexual Activity   Alcohol use: Not Currently    Alcohol/week: 0.0 standard drinks    Comment: occasional wine   Drug use: Yes    Types: Marijuana    Comment: 01-12-2021  per pt once a week   Sexual activity: Not on file  Other Topics Concern   Not on file  Social History Narrative   H H  of 1      5  pets.   She is a former smoker   Retired Programmer, applications; Conservator, museum/gallery   etoh   Red wine  1 per night.    Tea green tea and earl gray    Moved from [place] to [place] area in [place], 1983   1 pregnancy   Husband died spring  2016 cv   Sister died 26-Mar-2015  Bone cancer    3 remaining sisters            Social Determinants of Health   Financial Resource Strain: Low Risk    Difficulty of Paying Living Expenses: Not hard at all  Food Insecurity: No Food Insecurity   Worried About Charity fundraiser in the Last Year: Never true   Arboriculturist in the Last Year: Never true  Transportation Needs: No Transportation Needs   Lack of Transportation (Medical): No   Lack of Transportation (Non-Medical): No  Physical Activity: Inactive   Days of Exercise per Week: 0 days   Minutes of Exercise per Session: 0 min  Stress: No Stress Concern Present   Feeling of Stress : Not at all  Social Connections: Moderately Integrated   Frequency of Communication with Friends and Family: More than three times a week   Frequency of Social Gatherings with Friends and Family: More than three times a week   Attends Religious Services: More than 4 times per year   Active Member of [employer] or Organizations: Yes   Attends Archivist Meetings: More than 4 times per year   Marital Status: Widowed     Clinical Intake:   Diabetic?  No   Activities of Daily Living In your present state of health, do you have any difficulty performing the following activities: 11/18/2021 05/16/2021  Hearing? N N  Vision? N N  Difficulty concentrating or making decisions? N N  Walking or climbing stairs? N N  Dressing or bathing? N N  Doing errands, shopping? N N  Preparing Food and eating ? N -  Using the Toilet? N -  In the past six months, have you accidently leaked urine? N -  Do you have problems with loss of bowel control? N -  Managing your Medications? N  -  Managing your Finances? N -  Housekeeping or  managing your Housekeeping? N -  Some recent data might be hidden    Patient Care Team: Panosh, Standley Brooking, MD as PCP - General Inda Castle, MD (Inactive) as Attending Physician (Gastroenterology) [name], MD as Consulting Physician (Oncology) Kyung Rudd, MD as Consulting Physician (Radiation Oncology) Pieter Partridge, DO as Consulting Physician (Neurology) [name], NP as Nurse Practitioner (Nurse Practitioner) Marcy Siren, MD as Referring Physician (Obstetrics and Gynecology) Viona Gilmore, [facility] as Pharmacist (Pharmacist)  Indicate any recent Medical Services you may have received from other than [facility] providers in the past year (date may be approximate).     Assessment:   This is a routine wellness examination for Ashley Moore.  Hearing/Vision screen Hearing Screening - Comments:: No difficulty hearing Vision Screening - Comments:: Wears glasses. Followed by Dr Rise Patience  Dietary issues and exercise activities discussed: Exercise limited by: None identified   Goals Addressed             This Visit's Progress    Exercise 150 minutes per week (moderate activity)       Staying alive       Depression Screen [facility] 2/9 Scores 11/18/2021 10/25/2021 12/10/2020 11/25/2020 09/25/2019 10/24/2017 06/29/2017  PHQ - 2 Score 0 1 0 0 1 0 0  PHQ- 9 Score 0 3 0 0 - - -    Fall Risk Fall Risk  11/18/2021 10/25/2021 12/10/2020 06/10/2020 09/25/2019  Falls in the past year? 0 1 1 0 0  Number falls in past yr: 0 0 0 0 -  Comment - - was hit by door and fell on concrete - -  Injury with Fall? 0 1 1 0 -  Comment - - minor bruises - -  Risk Factor Category  - - - - -  Risk for fall due to : No Fall Risks - - - Medication side effect  Follow up - - Falls evaluation completed;Falls prevention discussed - Falls evaluation completed;Education provided;Falls prevention discussed    FALL RISK PREVENTION PERTAINING TO THE  HOME:  Any stairs in or around the home? Yes  If so, are there any without handrails? No  Home free of loose throw rugs in walkways, pet beds, electrical cords, etc? Yes  Adequate lighting in your home to reduce risk of falls? Yes   ASSISTIVE DEVICES UTILIZED TO PREVENT FALLS:  Life alert? No  Use of a cane, walker or w/c? Yes  Grab bars in the bathroom? No  Shower chair or bench in shower? Yes  Elevated toilet seat or a handicapped toilet? Yes   TIMED UP AND GO:  Was the test performed? No .   Cognitive Function:     6CIT Screen 11/18/2021 09/25/2019  What Year? 0 points 0 points  What month? 0 points 0 points  What time? 0 points 0 points  Count back from 20 0 points 0 points  Months in reverse 0 points 0 points  Repeat phrase 0 points 0 points  Total Score 0 0    Immunizations Immunization History  Administered Date(s) Administered   Fluad Quad(high Dose 65+) 05/23/2019, 06/18/2020, 07/22/2021   Influenza Whole 06/25/2008, 08/10/2010   Influenza, High Dose Seasonal PF 06/11/2015, 10/27/2016, 06/21/2017   Influenza,inj,Quad PF,6+ Mos 06/28/2018   PFIZER(Purple Top)SARS-COV-2 Vaccination 11/17/2019, 12/10/2019, 07/02/2020   Pneumococcal Conjugate-13 03/11/2015   Pneumococcal Polysaccharide-23 04/29/2016   Tdap 02/13/2013   Zoster, Live 02/13/2013    TDAP status: Up to date  Flu Vaccine status: Up to  date  Pneumococcal vaccine status: Up to date  Covid-19 vaccine status: Completed vaccines  Qualifies for Shingles Vaccine? Yes   Zostavax completed No   Shingrix Completed?: No.    Education has been provided regarding the importance of this vaccine. Patient has been advised to call insurance company to determine out of pocket expense if they have not yet received this vaccine. Advised may also receive vaccine at local pharmacy or Health Dept. Verbalized acceptance and understanding.  Screening Tests Health Maintenance  Topic Date Due   COLONOSCOPY (Pts 45-32yrs  Insurance coverage will need to be confirmed)  04/24/2022 (Originally 11/05/2020)   COVID-19 Vaccine (4 - Booster for [place] series) 04/24/2022 (Originally 08/27/2020)   Zoster Vaccines- Shingrix (1 of 2) 04/24/2022 (Originally 08/22/1970)   MAMMOGRAM  06/16/2022   TETANUS/TDAP  02/14/2023   Pneumonia Vaccine 41+ Years old  Completed   INFLUENZA VACCINE  Completed   DEXA SCAN  Completed   Hepatitis C Screening  Completed   HPV VACCINES  Aged Out    Health Maintenance  There are no preventive care reminders to display for this patient.  Colorectal cancer screening: Type of screening: Colonoscopy. Completed 11/05/10. Repeat every 10 years  Mammogram status: Completed 06/16/21. Repeat every year  Bone Density status: Completed 06/16/21. Results reflect: Bone density results: OSTEOPOROSIS. Repeat every 2 years.  Lung Cancer Screening: (Low Dose CT Chest recommended if Age 57-80 years, 30 pack-year currently smoking OR have quit w/in 15years.) does not qualify.     Additional Screening:  Hepatitis C Screening: does qualify; Completed 11/07/17  Vision Screening: Recommended annual ophthalmology exams for early detection of glaucoma and other disorders of the eye. Is the patient up to date with their annual eye exam?  Yes  Who is the provider or what is the name of the office in which the patient attends annual eye exams? Dr Darletta Moll If pt is not established with a provider, would they like to be referred to a provider to establish care? No .   Dental Screening: Recommended annual dental exams for proper oral hygiene  Community Resource Referral / Chronic Care Management:  CRR required this visit?  No   CCM required this visit?  No      Plan:     I have personally reviewed and noted the following in the patients chart:   Medical and social history Use of alcohol, tobacco or illicit drugs  Current medications and supplements including opioid prescriptions.  Functional  ability and status Nutritional status Physical activity Advanced directives List of other physicians Hospitalizations, surgeries, and ER visits in previous 12 months Vitals Screenings to include cognitive, depression, and falls Referrals and appointments  In addition, I have reviewed and discussed with patient certain preventive protocols, quality metrics, and best practice recommendations. A written personalized care plan for preventive services as well as general preventive health recommendations were provided to patient.     Criselda Peaches, LPN   03/24/2830   Nurse Notes: None

## 2021-11-28 ENCOUNTER — Other Ambulatory Visit: Payer: Self-pay | Admitting: Oncology

## 2021-12-01 ENCOUNTER — Inpatient Hospital Stay: Payer: Medicare HMO

## 2021-12-01 ENCOUNTER — Inpatient Hospital Stay: Payer: Medicare HMO | Admitting: Nurse Practitioner

## 2021-12-01 ENCOUNTER — Encounter: Payer: Self-pay | Admitting: Nurse Practitioner

## 2021-12-01 ENCOUNTER — Other Ambulatory Visit: Payer: Self-pay

## 2021-12-01 VITALS — BP 115/70 | HR 80 | Temp 97.8°F | Resp 18 | Wt 158.6 lb

## 2021-12-01 DIAGNOSIS — C210 Malignant neoplasm of anus, unspecified: Secondary | ICD-10-CM

## 2021-12-01 DIAGNOSIS — Z5112 Encounter for antineoplastic immunotherapy: Secondary | ICD-10-CM | POA: Diagnosis not present

## 2021-12-01 DIAGNOSIS — C772 Secondary and unspecified malignant neoplasm of intra-abdominal lymph nodes: Secondary | ICD-10-CM

## 2021-12-01 DIAGNOSIS — Z79899 Other long term (current) drug therapy: Secondary | ICD-10-CM | POA: Diagnosis not present

## 2021-12-01 LAB — CMP (CANCER CENTER ONLY)
ALT: 12 U/L (ref 0–44)
AST: 22 U/L (ref 15–41)
Albumin: 4 g/dL (ref 3.5–5.0)
Alkaline Phosphatase: 72 U/L (ref 38–126)
Anion gap: 8 (ref 5–15)
BUN: 27 mg/dL — ABNORMAL HIGH (ref 8–23)
CO2: 25 mmol/L (ref 22–32)
Calcium: 9.6 mg/dL (ref 8.9–10.3)
Chloride: 104 mmol/L (ref 98–111)
Creatinine: 0.94 mg/dL (ref 0.44–1.00)
GFR, Estimated: >60 mL/min (ref >60)
Glucose, Bld: 78 mg/dL (ref 70–99)
Potassium: 4.3 mmol/L (ref 3.5–5.1)
Sodium: 137 mmol/L (ref 135–145)
Total Bilirubin: 0.3 mg/dL (ref 0.3–1.2)
Total Protein: 7.6 g/dL (ref 6.5–8.1)

## 2021-12-01 LAB — CBC WITH DIFFERENTIAL (CANCER CENTER ONLY)
Abs Immature Granulocytes: 0.01 10*3/uL (ref 0.00–0.07)
Basophils Absolute: 0 10*3/uL (ref 0.0–0.1)
Basophils Relative: 1 %
Eosinophils Absolute: 0.1 10*3/uL (ref 0.0–0.5)
Eosinophils Relative: 2 %
HCT: 36.2 % (ref 36.0–46.0)
Hemoglobin: 11.3 g/dL — ABNORMAL LOW (ref 12.0–15.0)
Immature Granulocytes: 0 %
Lymphocytes Relative: 10 %
Lymphs Abs: 0.5 10*3/uL — ABNORMAL LOW (ref 0.7–4.0)
MCH: 27.6 pg (ref 26.0–34.0)
MCHC: 31.2 g/dL (ref 30.0–36.0)
MCV: 88.3 fL (ref 80.0–100.0)
Monocytes Absolute: 0.4 10*3/uL (ref 0.1–1.0)
Monocytes Relative: 8 %
Neutro Abs: 3.9 10*3/uL (ref 1.7–7.7)
Neutrophils Relative %: 79 %
Platelet Count: 221 10*3/uL (ref 150–400)
RBC: 4.1 MIL/uL (ref 3.87–5.11)
RDW: 16.4 % — ABNORMAL HIGH (ref 11.5–15.5)
WBC Count: 4.9 10*3/uL (ref 4.0–10.5)
nRBC: 0 % (ref 0.0–0.2)

## 2021-12-01 LAB — TSH: TSH: 3.227 u[IU]/mL (ref 0.350–4.500)

## 2021-12-01 MED ORDER — SODIUM CHLORIDE 0.9 % IV SOLN: Freq: Once | INTRAVENOUS | Status: AC

## 2021-12-01 MED ORDER — SODIUM CHLORIDE 0.9 % IV SOLN
240.0000 mg | Freq: Once | INTRAVENOUS | Status: AC
  Administered 2021-12-01: 240 mg via INTRAVENOUS
  Filled 2021-12-01: qty 24

## 2021-12-01 NOTE — Progress Notes (Signed)
?[place] ?OFFICE PROGRESS NOTE ? ? ?Diagnosis: Anal cancer ? ?INTERVAL HISTORY:  ? ?Ashley Moore returns as scheduled.  She completed another cycle of nivolumab 11/17/2021.  She feels well.  No nausea or vomiting.  No rash.  She has an occasional loose stool.  Good appetite.  Edema described as "minimal". ? ?Objective: ? ?Vital signs in last 24 hours: ? ?Blood pressure 115/70, pulse 80, temperature 97.8 ?F (36.6 ?C), temperature source Tympanic, resp. rate 18, weight 158 lb 9.6 oz (71.9 kg), SpO2 100 %. ?  ? ?HEENT: No thrush or ulcers. ?Lymphatics: No palpable cervical or supraclavicular lymph nodes. ?Resp: Scattered faint rales.  No respiratory distress. ?Cardio: Regular rate and rhythm. ?GI: No hepatosplenomegaly. ?Vascular: Trace lower leg edema bilaterally. ?Skin: No rash. ? ? ?Lab Results: ? ?Lab Results  ?Component Value Date  ? WBC 5.1 11/17/2021  ? HGB 11.3 (L) 11/17/2021  ? HCT 36.2 11/17/2021  ? MCV 87.4 11/17/2021  ? PLT 201 11/17/2021  ? NEUTROABS 4.0 11/17/2021  ? ? ?Imaging: ? ?No results found. ? ?Medications: I have reviewed the patient's current medications. ? ?Assessment/Plan: ?Anal cancer ?CT abdomen/pelvis 10/21/2016-thickening of the anus extending to the junction between the anus and rectum with a large stool ball in the rectum and mild fat stranding posterior to the rectum. Enlarged lymph nodes in the right and left inguinal regions. A few mildly prominent nodes seen posterior to the rectum. 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 ?PET scan [id number] hypermetabolic anal mass with hypermetabolic metastases to the groin region bilaterally, left pelvic sidewall and presacral space. ?Initiation of radiation and cycle 1 5-FU/mitomycin C 11/14/2016 ?Cycle 2 5-FU/mitomycin C 12/12/2016 (5-FU dose reduced due to mucositis, diarrhea, skin breakdown) ?Radiation completed 12/23/2016 ?CT abdomen/pelvis 04/14/2017-resolution of anal mass and bilateral  inguinal lymphadenopathy. No residual tumor seen. ?CT abdomen/pelvis 01/28/2020-right hydroureteronephrosis, transition at the level of the mid right ureter, retroperitoneal lymphadenopathy ?CT renal stone study 01/28/2020-severe right hydronephrosis and proximal right hydroureter, 8 mm area of increased attenuation in the proximal right ureter-stone versus soft tissue mass ?PET 02/13/2020-hypermetabolic right retroperitoneal nodal metastases in the aortocaval and posterior pericaval chains, nonspecific mild anal wall hypermetabolism, right nephroureteral stent ?02/21/2020 anorectal exam per Dr. Ashley Jacobs radiation changes of the skin around the anal margin.  Posterior midline smooth scarring.  Mildly stenotic.  Stenosis seems better.  No palpable concerning lesions.  Entire anal canal and distal rectum feels smooth and healthy.  Partial anoscopy performed with no lesions in the anal canal. ?Xeloda/radiation beginning 03/02/2020, completed 04/13/2020 ?CT abdomen/pelvis 06/17/2020-resolution of right retroperitoneal lymphadenopathy, no remaining pathologically enlarged lymph nodes, residual mild right hydronephrosis, no evidence of progressive disease, stable anal wall thickening ?Digital rectal exam by Dr. Quentin Cornwall 08/26/2020-posterior midline smooth scarring.  Mildly stenotic.  Stenosis seems better.  No palpable concerning lesions.  Anal canal feels smooth without palpable concern.  Unable to tolerate anoscopy.  Next visit in 6 months for surveillance. ?CT abdomen/pelvis 10/28/2020-no evidence of local recurrence or metastatic disease.  Stable mild rectal wall thickening.  New diffuse bladder wall thickening possibly related to prior radiation ?CT abdomen/pelvis 01/16/2021- recurrent right-sided hydronephrosis and proximal right hydroureter status post removal of right ureter stent, new soft tissue nodule along the course of the right ureter between the right psoas and IVC suspicious for recurrent lymphadenopathy ?PET  02/12/2021-small focus of hypermetabolism at the anorectal junction without a definable mass, right retroperitoneal nodal metastasis with obstruction of the proximal right ureter, right middle lobe  hypermetabolic nodule, hypermetabolism in the right hilum without a defined nodal mass, hypermetabolic left supraclavicular and left axillary nodes ?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 ?Cycle 1 Taxol/carboplatin 03/11/2021 ?Cycle 2 Taxol/carboplatin 03/31/2021, Fulphila ?Cycle 3 Taxol/carboplatin 04/21/2021, Fulphila ?CTs 05/11/2021-decrease size of FDG avid nodule in right middle lobe, right middle lobe subpleural nodule slightly enlarged, decreased in size of left axillary and subpectoral nodes, unchanged FDG avid right iliac nodes ?Cycle 4 Taxol/carboplatin 05/12/2021, G-CSF discontinued secondary to poor tolerance, Taxol and carboplatin dose reduced ?Cycle 5 Taxol/carboplatin 06/02/2021 ?Cycle 6 carboplatin 07/01/2021, Taxol held due to neuropathy ?Cycle 7 carboplatin 07/29/2021, Taxol held due to neuropathy ?CTs 08/16/2021-new liver lesion, increase in right psoas lymph node, unchanged right hydronephrosis, new mediastinal and right hilar lymphadenopathy, enlargement of bilateral pulmonary nodules and at least one new nodule, new subcarinal node ?Cycle 1 nivolumab 08/25/2021 ?Cycle 2 nivolumab 09/08/2021 ?Cycle 3 nivolumab 09/22/2021 ?Cycle 4 nivolumab 10/06/2021 ?Cycle 5 nivolumab 10/20/2021 ?Cycle 6 nivolumab 11/04/2021 ?CTs 11/12/2021-resolution of lung nodules, decreased size of right psoas node, no remaining mediastinal lymphadenopathy, resolution of right liver lesion ?Cycle 7 nivolumab 11/17/2021 ?Cycle 8 nivolumab 12/01/2021 ?Left labial lesions. Question direct extension from the anal cancer versus metastatic disease from anal cancer versus a separate malignant process. ?History of pain and bleeding secondary to #1 and skin breakdown ?History of Bowen's disease  treated with vaginal surgery, topical agent early 1990s. ?Multiple family members with breast cancer. ?History of hypokalemia-likely secondary to decreased nutritional intake and diarrhea; potassium in normal range 01/16/2017. No longer taking a potassium supplement. ?Right hydroureteronephrosis on CT 01/28/2020, status post a cystoscopy/pyelogram 01/28/2020 confirming extrinsic compression of the right ureter, status post stent placement ?Right ureter stent exchange 06/12/2020 ?Right ureter stent removed 01/13/2021 ?  ?8.  Admission 01/07/2021 with Klebsiella pneumoniae urosepsis/bacteremia  ?9.  Hospital admission 05/15/2021 with UTI/possible right pyelonephritis ?  ?  ? ?Disposition: Ashley Moore appears stable.  She has completed 7 cycles of nivolumab.  There is no clinical evidence of disease progression.  Plan to proceed with cycle 8 today as scheduled. ? ?CBC and chemistry panel reviewed.  Labs adequate to proceed as above. ? ?She will return for follow-up in 2 weeks.  We are available to see her sooner if needed. ? ? ? ?Ned Card ANP/GNP-BC  ? ?12/01/2021  ?1:31 PM ? ? ? ? ? ? ? ?

## 2021-12-01 NOTE — Progress Notes (Signed)
Patient seen by Ned Card NP today ? ?Vitals are within treatment parameters. ? ?Labs reviewed by Ned Card NP CBC diff reviewed and within treatment parameters, CMP pending. ? ? ?

## 2021-12-01 NOTE — Progress Notes (Signed)
Patient presents for treatment. RN assessment completed along with the following: ? ?Labs/vitals reviewed - Yes, and within treatment parameters.   ?Weight within 10% of previous measurement - Yes ?Informed consent completed and reflects current therapy/intent - Yes, on date 08/25/21             ?Provider progress note reviewed - Today's provider note was reviewed.  ?Treatment/Antibody/Supportive plan reviewed - Yes, and there are no adjustments needed for today's treatment. ?S&H and other orders reviewed - Yes, and there are no additional orders identified. ?Previous treatment date reviewed - Yes, and the appropriate amount of time has elapsed between treatments. ?Clinic Hand Off Received from - Lupita Raider, RN ? ?Patient to proceed with treatment.   ?

## 2021-12-01 NOTE — Patient Instructions (Signed)
[facility]   ?Discharge Instructions: ?Thank you for choosing [facility] to provide your oncology and hematology care.  ? ?If you have a lab appointment with the [facility], please go directly to the [facility] and check in at the registration area. ?  ?Wear comfortable clothing and clothing appropriate for easy access to any Portacath or PICC line.  ? ?We strive to give you quality time with your provider. You may need to reschedule your appointment if you arrive late (15 or more minutes).  Arriving late affects you and other patients whose appointments are after yours.  Also, if you miss three or more appointments without notifying the office, you may be dismissed from the clinic at the provider?s discretion.    ?  ?For prescription refill requests, have your pharmacy contact our office and allow 72 hours for refills to be completed.   ? ?Today you received the following chemotherapy and/or immunotherapy agents Nivolumab (OPDIVO).    ?  ?To help prevent nausea and vomiting after your treatment, we encourage you to take your nausea medication as directed. ? ?BELOW ARE SYMPTOMS THAT SHOULD BE REPORTED IMMEDIATELY: ?*FEVER GREATER THAN 100.4 F (38 ?C) OR HIGHER ?*CHILLS OR SWEATING ?*NAUSEA AND VOMITING THAT IS NOT CONTROLLED WITH YOUR NAUSEA MEDICATION ?*UNUSUAL SHORTNESS OF BREATH ?*UNUSUAL BRUISING OR BLEEDING ?*URINARY PROBLEMS (pain or burning when urinating, or frequent urination) ?*BOWEL PROBLEMS (unusual diarrhea, constipation, pain near the anus) ?TENDERNESS IN MOUTH AND THROAT WITH OR WITHOUT PRESENCE OF ULCERS (sore throat, sores in mouth, or a toothache) ?UNUSUAL RASH, SWELLING OR PAIN  ?UNUSUAL VAGINAL DISCHARGE OR ITCHING  ? ?Items with * indicate a potential emergency and should be followed up as soon as possible or go to the Emergency Department if any problems should occur. ? ?Please show the CHEMOTHERAPY ALERT CARD or IMMUNOTHERAPY ALERT CARD at  check-in to the Emergency Department and triage nurse. ? ?Should you have questions after your visit or need to cancel or reschedule your appointment, please contact [facility]  Dept: [phone]  and follow the prompts.  Office hours are 8:00 a.m. to 4:30 p.m. Monday - Friday. Please note that voicemails left after 4:00 p.m. may not be returned until the following business day.  We are closed weekends and major holidays. You have access to a nurse at all times for urgent questions. Please call the main number to the clinic Dept: [phone] and follow the prompts. ? ? ?For any non-urgent questions, you may also contact your provider using MyChart. We now offer e-Visits for anyone 51 and older to request care online for non-urgent symptoms. For details visit [email or web address]. ?  ?Also download the MyChart app! Go to the app store, search "MyChart", open the app, select [facility], and log in with your MyChart username and password. ? ?Due to Covid, a mask is required upon entering the hospital/clinic. If you do not have a mask, one will be given to you upon arrival. For doctor visits, patients may have 1 support person aged 81 or older with them. For treatment visits, patients cannot have anyone with them due to current Covid guidelines and our immunocompromised population.  ? ?Nivolumab injection ?What is this medication? ?NIVOLUMAB (nye VOL ue mab) is a monoclonal antibody. It treats certain types of cancer. Some of the cancers treated are colon cancer, head and neck cancer, Hodgkin lymphoma, lung cancer, and melanoma. ?This medicine may be used for other purposes;  ask your health care provider or pharmacist if you have questions. ?COMMON BRAND NAME(S): Opdivo ?What should I tell my care team before I take this medication? ?They need to know if you have any of these conditions: ?Autoimmune diseases such as Crohn's disease, ulcerative colitis, or lupus ?Have had or planning to  have an allogeneic stem cell transplant (uses someone else's stem cells) ?History of chest radiation ?Organ transplant ?Nervous system problems such as myasthenia gravis or Guillain-Barre syndrome ?An unusual or allergic reaction to nivolumab, other medicines, foods, dyes, or preservatives ?Pregnant or trying to get pregnant ?Breast-feeding ?How should I use this medication? ?This medication is injected into a vein. It is given in a hospital or clinic setting. ?A special MedGuide will be given to you before each treatment. Be sure to read this information carefully each time. ?Talk to your care team regarding the use of this medication in children. While it may be prescribed for children as young as 12 years for selected conditions, precautions do apply. ?Overdosage: If you think you have taken too much of this medicine contact a poison control center or emergency room at once. ?NOTE: This medicine is only for you. Do not share this medicine with others. ?What if I miss a dose? ?Keep appointments for follow-up doses. It is important not to miss your dose. Call your care team if you are unable to keep an appointment. ?What may interact with this medication? ?Interactions have not been studied. ?This list may not describe all possible interactions. Give your health care provider a list of all the medicines, herbs, non-prescription drugs, or dietary supplements you use. Also tell them if you smoke, drink alcohol, or use illegal drugs. Some items may interact with your medicine. ?What should I watch for while using this medication? ?Your condition will be monitored carefully while you are receiving this medication. ?You may need blood work done while you are taking this medication. ?Do not become pregnant while taking this medication or for 5 months after stopping it. Women should inform their care team if they wish to become pregnant or think they might be pregnant. There is a potential for serious harm to an unborn  child. Talk to your care team for more information. Do not breast-feed an infant while taking this medication or for 5 months after stopping it. ?What side effects may I notice from receiving this medication? ?Side effects that you should report to your care team as soon as possible: ?Allergic reactions--skin rash, itching, hives, swelling of the face, lips, tongue, or throat ?Bloody or black, tar-like stools ?Change in vision ?Chest pain ?Diarrhea ?Dry cough, shortness of breath or trouble breathing ?Eye pain ?Fast or irregular heartbeat ?Fever, chills ?High blood sugar (hyperglycemia)--increased thirst or amount of urine, unusual weakness or fatigue, blurry vision ?High thyroid levels (hyperthyroidism)--fast or irregular heartbeat, weight loss, excessive sweating or sensitivity to heat, tremors or shaking, anxiety, nervousness, irregular menstrual cycle or spotting ?Kidney injury--decrease in the amount of urine, swelling of the ankles, hands, or feet ?Liver injury--right upper belly pain, loss of appetite, nausea, light-colored stool, dark yellow or brown urine, yellowing skin or eyes, unusual weakness or fatigue ?Low red blood cell count--unusual weakness or fatigue, dizziness, headache, trouble breathing ?Low thyroid levels (hypothyroidism)--unusual weakness or fatigue, increased sensitivity to cold, constipation, hair loss, dry skin, weight gain, feelings of depression ?Mood and behavior changes-confusion, change in sex drive or performance, irritability ?Muscle pain or cramps ?Pain, tingling, or numbness in the hands or feet, muscle  weakness, trouble walking, loss of balance or coordination ?Red or dark brown urine ?Redness, blistering, peeling, or loosening of the skin, including inside the mouth ?Stomach pain ?Unusual bruising or bleeding ?Side effects that usually do not require medical attention (report to your care team if they continue or are bothersome): ?Bone pain ?Constipation ?Loss of  appetite ?Nausea ?Tiredness ?Vomiting ?This list may not describe all possible side effects. Call your doctor for medical advice about side effects. You may report side effects to FDA at 1-800-FDA-1088. ?Where should I k

## 2021-12-06 ENCOUNTER — Encounter: Payer: Self-pay | Admitting: Physical Therapy

## 2021-12-06 ENCOUNTER — Other Ambulatory Visit: Payer: Self-pay

## 2021-12-06 ENCOUNTER — Ambulatory Visit: Payer: Medicare HMO | Admitting: Physical Therapy

## 2021-12-06 DIAGNOSIS — R600 Localized edema: Secondary | ICD-10-CM

## 2021-12-06 DIAGNOSIS — R293 Abnormal posture: Secondary | ICD-10-CM

## 2021-12-06 DIAGNOSIS — M25511 Pain in right shoulder: Secondary | ICD-10-CM

## 2021-12-06 DIAGNOSIS — G8929 Other chronic pain: Secondary | ICD-10-CM | POA: Diagnosis not present

## 2021-12-06 DIAGNOSIS — M6281 Muscle weakness (generalized): Secondary | ICD-10-CM | POA: Diagnosis not present

## 2021-12-06 DIAGNOSIS — M25611 Stiffness of right shoulder, not elsewhere classified: Secondary | ICD-10-CM

## 2021-12-06 NOTE — Therapy (Signed)
?OUTPATIENT PHYSICAL THERAPY TREATMENT NOTE ? ? ?Patient Name: Ashley Moore ?MRN: [id number] ?DOB:July 25, 1951, 71 y.o., female ?Today's Date: 12/06/2021 ? ?PCP: [name], MD ?REFERRING PROVIDER: Persons, [name], PA ? ? PT End of Session - 12/06/21 1519   ? ? Visit Number 6   ? Number of Visits 12   ? Date for PT Re-Evaluation 01/11/22   ? Authorization Type [employer] 12 visits applied for on 10/19/2021 to 12/17/2021   ? Progress Note Due on Visit 10   ? PT Start Time 1513   ? PT Stop Time 2409   ? PT Time Calculation (min) 42 min   ? Activity Tolerance Patient tolerated treatment well   ? Behavior During Therapy [facility] for tasks assessed/performed   ? ?  ?  ? ?  ? ? ? ? ? ?Past Medical History:  ?Diagnosis Date  ? Arthritis   ? 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 Chronic low back pain   ? Chronic radiation cystitis   ? DDD (degenerative disc disease), cervical   ? Family history of breast cancer   ? Family history of lung cancer   ? Family history of non-Hodgkin's lymphoma   ? Family history of ovarian cancer   ? Headache   ? Hx of varicella   ? Hydronephrosis of right kidney   ? urologist--- dr Tresa Moore,  malignant,  treated with ureter stent  ? Hypothyroidism   ? followed by pcp  ? Lower urinary tract symptoms (LUTS)   ? 01-12-2021  per pt treated with accupuncture at dr Tresa Moore office  ? Lymphedema of both lower extremities   ? PICC (peripherally inserted central catheter) in place 01/11/2021  ? placed at [facility] in [place], [place] for IV antibiotics  ? Positive blood culture 01/08/2021  ? Klebsiella Pneumaniae,  treated with daily IV antibiotic  ? 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[facility]) oncologist--- dr Benay Spice  ? 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 Retroperitoneal lymphadenopathy   ? 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 Sepsis due to Klebsiella pneumoniae ([place]) 01/07/2021  ? pt admitted to [facility] in [place],  dx sepsis secondary to acute pyelonephritis with bacteremia , positive blood culture,  discharged 01-11-2021 home daily IV antibiotic  ? Wears glasses   ? ?Past Surgical History:  ?Procedure Laterality Date  ? BUNIONECTOMY Right yrs ago  ? CYSTOSCOPY W/ URETERAL STENT PLACEMENT Right 06/12/2020  ? Procedure: CYSTOSCOPY WITH RETROGRADE PYELOGRAM/URETERAL STENT EXCHANGE;  Surgeon: Alexis Frock, MD;  Location: [facility];  Service: Urology;  Laterality: Right;  ? CYSTOSCOPY W/ URETERAL STENT PLACEMENT Right 01/13/2021  ? Procedure: CYSTOSCOPY WITH RETROGRADE PYELOGRAM/URETERAL STENT REMOVALRIGHT;  Surgeon: Alexis Frock, MD;  Location: [facility];  Service: Urology;  Laterality: Right;  45 MINS  ? CYSTOSCOPY WITH RETROGRADE PYELOGRAM, URETEROSCOPY AND STENT PLACEMENT Right 01/28/2020  ? Procedure: CYSTOSCOPY WITH RETROGRADE PYELOGRAM, URETEROSCOPY AND STENT PLACEMENT;  Surgeon: Alexis Frock, MD;  Location: [facility];  Service: Urology;  Laterality: Right;  ? IR GENERIC HISTORICAL  11/14/2016  ? IR US GUIDE VASC ACCESS RIGHT 11/14/2016 [facility]  ? IR GENERIC HISTORICAL  11/14/2016  ? IR FLUORO GUIDE CV LINE RIGHT 11/14/2016 [facility]  ? IR GENERIC HISTORICAL  12/12/2016  ? IR US GUIDE VASC ACCESS RIGHT 12/12/2016 Jenny Reichmann  Pascal Lux, MD [facility]  ? IR GENERIC HISTORICAL  12/12/2016  ? IR FLUORO GUIDE CV LINE RIGHT 12/12/2016 [name], MD [facility]  ? SKIN CANCER EXCISION  1990  ? bowens disease  ? ?Patient Active Problem List  ? Diagnosis Date Noted  ? Primary osteoarthritis, 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/28/2021  ? Atypical chest pain 06/29/2021  ? Intractable abdominal pain 05/16/2021  ? Acute pyelonephritis 05/16/2021  ? Acute cystitis without hematuria 05/16/2021  ? Hydronephrosis of right kidney 05/16/2021  ? Obstructive uropathy 05/16/2021  ? Hypothyroidism 05/16/2021  ?  Hyponatremia 05/16/2021  ? Obesity (BMI 30-39.9) 05/16/2021  ? Goals of care, counseling/discussion 03/01/2021  ? SIRS (systemic inflammatory response syndrome) ([place]) 01/13/2021  ? Pyelonephritis 01/13/2021  ? 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 Genetic testing 01/27/2020  ? Family history of breast cancer   ? Family history of lung cancer   ? Family history of ovarian cancer   ? Family history of non-Hodgkin's lymphoma   ? 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 Port catheter in place 11/21/2016  ? 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 Cystitis 05/06/2015  ? Degenerative cervical disc 04/08/2015  ? Trigger point of neck 03/26/2015  ? Pain in joint, ankle and foot 04/23/2013  ? Visit for preventive health examination 02/13/2013  ? Routine gynecological examination 02/13/2013  ? Family history of breast cancer in first degree relative 02/13/2013  ? Rash and nonspecific skin eruption 02/13/2013  ? 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  ? VITAMIN D DEFICIENCY 06/25/2008  ? NECK PAIN 06/02/2008  ? FOOT PAIN 06/02/2008  ? Osteopenia 06/02/2008  ? ? ?REFERRING DIAG: M25.511,G89.29 (ICD-10-CM) - Chronic right shoulder pain  ? ?THERAPY DIAG:  ?Stiffness of right shoulder, not elsewhere classified ? ?Muscle weakness (generalized) ? ?Abnormal posture ? ?Localized edema ? ?Chronic right shoulder pain ? ?PERTINENT HISTORY: Arthritis Bowen's diseaseexcised 1992 Chronic low back pain Chronic radiation cystitis DDD (degenerative disc disease), cervical Family history of breast cancer Family history of lung cancer Family history of non-Hodgkin's lymphoma Family history of ovarian cancer Headache Hx of varicella Hydronephrosis of right kidney rectal cancer  ? ?PRECAUTIONS: Other: infusion therapy for Cancer , fall precuations  ? ?SUBJECTIVE: Pt arriving reporting increased difficulty washing her hair and with increased stiffness in her Rt shoulder. Pt stating she  feels it's due to her infusion last Wednesday. Pt stating by Saturday she was extremely fatigued and even forgetful at times. Pt also stating the colder weather has increased her stiffness.  ? ?PAIN:  ?Are you having pain? Yes ?NPRS scale: 5-6/10 ?Pain location: anterior shoulder and lateral Rt shoulder into elbow ?Pain orientation: Right  ?PAIN TYPE: aching ?Pain description: constant  ?Aggravating factors: reaching upward ?Relieving factors: resting and heat ? ? ? ?OBJECTIVE:  ?  ?  ?PATIENT SURVEYS:  ?FOTO: 51% on 10/19/2021 ? ?UPPER EXTREMITY AROM/PROM: ?  ?Active ROM  ?In supine Right ?10/19/2021 Left ?10/19/2021 Right ?12/06/2021  ?Shoulder flexion 120 164   ?Shoulder extension 30 45 ?    ?Shoulder abduction 98 160   ?Shoulder adduction       ?Shoulder internal rotation 75 shoulder abd 45 degrees 75   ?Shoulder external rotation 15 ?Shoulder abd 45 degrees 80   ?Elbow flexion 140 ?  145   ?Elbow extension 0 0   ?Wrist flexion       ?Wrist extension       ?Wrist ulnar deviation       ?  Wrist radial deviation       ?Wrist pronation       ?Wrist supination       ?(Blank rows = not tested) ?  ?Passive ROM ?In degrees Right ?10/19/2021 Right ?11/09/2021 Right  ?11/16/2021 Right ?12/06/2021  ?Shoulder flexion 130  120 134 145  ?Shoulder extension 35      ?Shoulder abduction 105 104  110 120  ?Shoulder adduction        ?Shoulder internal rotation 80 shoulder abd 45 degrees    80 shoulder  ?abducted 45 degrees  ?Shoulder external rotation 20 ?Shoulder abd 45 degrees 30 ?Shoulder abducted to 45 degrees   30 ?Shoulder abducted to 45 degrees  ?Elbow flexion        ?Elbow extension        ?Wrist flexion        ?Wrist extension        ?Wrist ulnar deviation        ?Wrist radial deviation        ?Wrist pronation        ?Wrist supination        ?  ?  ?UPPER EXTREMITY MMT: ?  ?MMT Right ?10/19/2021 Left ?10/19/2021 Right ?12/06/2021  ?Shoulder flexion 2+/5 4+/5 3-/5  ?Shoulder extension 2+/5 4+/5 3-/5  ?Shoulder abduction 2+/5 4+/5 3-/5   ?Shoulder adduction       ?Shoulder internal rotation 2+/5 4+/5 3-/5  ?Shoulder external rotation 2+/5 4+/5 3-/5  ?Middle trapezius       ?Lower trapezius       ?Elbow flexion       ?Elbow extension       ?

## 2021-12-12 ENCOUNTER — Other Ambulatory Visit: Payer: Self-pay | Admitting: Oncology

## 2021-12-13 ENCOUNTER — Other Ambulatory Visit: Payer: Self-pay

## 2021-12-13 ENCOUNTER — Ambulatory Visit (INDEPENDENT_AMBULATORY_CARE_PROVIDER_SITE_OTHER): Payer: Medicare HMO | Admitting: Physical Therapy

## 2021-12-13 ENCOUNTER — Encounter: Payer: Self-pay | Admitting: Physical Therapy

## 2021-12-13 DIAGNOSIS — M25511 Pain in right shoulder: Secondary | ICD-10-CM | POA: Diagnosis not present

## 2021-12-13 DIAGNOSIS — M25611 Stiffness of right shoulder, not elsewhere classified: Secondary | ICD-10-CM

## 2021-12-13 DIAGNOSIS — R293 Abnormal posture: Secondary | ICD-10-CM

## 2021-12-13 DIAGNOSIS — G8929 Other chronic pain: Secondary | ICD-10-CM

## 2021-12-13 DIAGNOSIS — M6281 Muscle weakness (generalized): Secondary | ICD-10-CM | POA: Diagnosis not present

## 2021-12-13 DIAGNOSIS — R600 Localized edema: Secondary | ICD-10-CM

## 2021-12-13 NOTE — Therapy (Signed)
?OUTPATIENT PHYSICAL THERAPY TREATMENT NOTE ? ? ?Patient Name: Ashley Moore ?MRN: [id number] ?DOB:1950/11/21, 71 y.o., female ?Today's Date: 12/13/2021 ? ?PCP: [name], MD ?REFERRING PROVIDER: Persons, [name], PA ? ? PT End of Session - 12/13/21 1341   ? ? Visit Number 7   ? Number of Visits 12   ? Date for PT Re-Evaluation 01/11/22   ? Authorization Type [employer] 12 visits applied for on 10/19/2021 to 12/17/2021   ? Progress Note Due on Visit 10   ? PT Start Time 1302   ? PT Stop Time 1344   ? PT Time Calculation (min) 42 min   ? Activity Tolerance Patient tolerated treatment well   ? Behavior During Therapy [facility] for tasks assessed/performed   ? ?  ?  ? ?  ? ? ? ? ? ? ?Past Medical History:  ?Diagnosis Date  ? Arthritis   ? 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 Chronic low back pain   ? Chronic radiation cystitis   ? DDD (degenerative disc disease), cervical   ? Family history of breast cancer   ? Family history of lung cancer   ? Family history of non-Hodgkin's lymphoma   ? Family history of ovarian cancer   ? Headache   ? Hx of varicella   ? Hydronephrosis of right kidney   ? urologist--- dr Tresa Moore,  malignant,  treated with ureter stent  ? Hypothyroidism   ? followed by pcp  ? Lower urinary tract symptoms (LUTS)   ? 01-12-2021  per pt treated with accupuncture at dr Tresa Moore office  ? Lymphedema of both lower extremities   ? PICC (peripherally inserted central catheter) in place 01/11/2021  ? placed at [facility] in [place], [place] for IV antibiotics  ? Positive blood culture 01/08/2021  ? Klebsiella Pneumaniae,  treated with daily IV antibiotic  ? 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[facility]) oncologist--- dr Benay Spice  ? 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 Retroperitoneal lymphadenopathy   ? 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 Sepsis due to Klebsiella pneumoniae ([place]) 01/07/2021  ? pt admitted to [facility] in [place],  dx sepsis secondary to acute pyelonephritis with bacteremia , positive blood culture,  discharged 01-11-2021 home daily IV antibiotic  ? Wears glasses   ? ?Past Surgical History:  ?Procedure Laterality Date  ? BUNIONECTOMY Right yrs ago  ? CYSTOSCOPY W/ URETERAL STENT PLACEMENT Right 06/12/2020  ? Procedure: CYSTOSCOPY WITH RETROGRADE PYELOGRAM/URETERAL STENT EXCHANGE;  Surgeon: Alexis Frock, MD;  Location: [facility];  Service: Urology;  Laterality: Right;  ? CYSTOSCOPY W/ URETERAL STENT PLACEMENT Right 01/13/2021  ? Procedure: CYSTOSCOPY WITH RETROGRADE PYELOGRAM/URETERAL STENT REMOVALRIGHT;  Surgeon: Alexis Frock, MD;  Location: [facility];  Service: Urology;  Laterality: Right;  45 MINS  ? CYSTOSCOPY WITH RETROGRADE PYELOGRAM, URETEROSCOPY AND STENT PLACEMENT Right 01/28/2020  ? Procedure: CYSTOSCOPY WITH RETROGRADE PYELOGRAM, URETEROSCOPY AND STENT PLACEMENT;  Surgeon: Alexis Frock, MD;  Location: [facility];  Service: Urology;  Laterality: Right;  ? IR GENERIC HISTORICAL  11/14/2016  ? IR US GUIDE VASC ACCESS RIGHT 11/14/2016 [facility]  ? IR GENERIC HISTORICAL  11/14/2016  ? IR FLUORO GUIDE CV LINE RIGHT 11/14/2016 [facility]  ? IR GENERIC HISTORICAL  12/12/2016  ? IR US GUIDE VASC ACCESS RIGHT 12/12/2016  [name], MD [facility]  ? IR GENERIC HISTORICAL  12/12/2016  ? IR FLUORO GUIDE CV LINE RIGHT 12/12/2016 [name], MD [facility]  ? SKIN CANCER EXCISION  1990  ? bowens disease  ? ?Patient Active Problem List  ? Diagnosis Date Noted  ? Primary osteoarthritis, 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/28/2021  ? Atypical chest pain 06/29/2021  ? Intractable abdominal pain 05/16/2021  ? Acute pyelonephritis 05/16/2021  ? Acute cystitis without hematuria 05/16/2021  ? Hydronephrosis of right kidney 05/16/2021  ? Obstructive uropathy 05/16/2021  ? Hypothyroidism 05/16/2021  ?  Hyponatremia 05/16/2021  ? Obesity (BMI 30-39.9) 05/16/2021  ? Goals of care, counseling/discussion 03/01/2021  ? SIRS (systemic inflammatory response syndrome) ([place]) 01/13/2021  ? Pyelonephritis 01/13/2021  ? 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 Genetic testing 01/27/2020  ? Family history of breast cancer   ? Family history of lung cancer   ? Family history of ovarian cancer   ? Family history of non-Hodgkin's lymphoma   ? 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 Port catheter in place 11/21/2016  ? 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 Anal cancer ([place]) 11/03/2016  ? Cystitis 05/06/2015  ? Degenerative cervical disc 04/08/2015  ? Trigger point of neck 03/26/2015  ? Pain in joint, ankle and foot 04/23/2013  ? Visit for preventive health examination 02/13/2013  ? Routine gynecological examination 02/13/2013  ? Family history of breast cancer in first degree relative 02/13/2013  ? Rash and nonspecific skin eruption 02/13/2013  ? 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  ? VITAMIN D DEFICIENCY 06/25/2008  ? NECK PAIN 06/02/2008  ? FOOT PAIN 06/02/2008  ? Osteopenia 06/02/2008  ? ? ?REFERRING DIAG: M25.511,G89.29 (ICD-10-CM) - Chronic right shoulder pain  ? ?THERAPY DIAG:  ?Stiffness of right shoulder, not elsewhere classified ? ?Abnormal posture ? ?Chronic right shoulder pain ? ?Muscle weakness (generalized) ? ?Localized edema ? ?PERTINENT HISTORY: Arthritis Bowen's diseaseexcised 1992 Chronic low back pain Chronic radiation cystitis DDD (degenerative disc disease), cervical Family history of breast cancer Family history of lung cancer Family history of non-Hodgkin's lymphoma Family history of ovarian cancer Headache Hx of varicella Hydronephrosis of right kidney rectal cancer  ? ?PRECAUTIONS: Other: infusion therapy for Cancer , fall precuations  ? ?SUBJECTIVE: Pt arriving reporting increased difficulty washing her hair and with increased stiffness in her Rt shoulder. Pt stating she  feels it's due to her infusion last Wednesday. Pt stating by Saturday she was extremely fatigued and even forgetful at times. Pt also stating the colder weather has increased her stiffness.  ? ?PAIN:  ?Are you having pain? Yes ?NPRS scale: 5-6/10 ?Pain location: anterior shoulder and lateral Rt shoulder into elbow ?Pain orientation: Right  ?PAIN TYPE: aching ?Pain description: constant  ?Aggravating factors: reaching upward ?Relieving factors: resting and heat ? ? ? ?OBJECTIVE:  ?  ?  ?PATIENT SURVEYS:  ?FOTO: 51% on 10/19/2021 ? ?UPPER EXTREMITY AROM/PROM: ?  ?Active ROM  ?In supine Right ?10/19/2021 Left ?10/19/2021 Right ?12/06/2021  ?Shoulder flexion 120 164   ?Shoulder extension 30 45 ?    ?Shoulder abduction 98 160   ?Shoulder adduction       ?Shoulder internal rotation 75 shoulder abd 45 degrees 75   ?Shoulder external rotation 15 ?Shoulder abd 45 degrees 80   ?Elbow flexion 140 ?  145   ?Elbow extension 0 0   ?Wrist flexion       ?Wrist extension       ?Wrist ulnar deviation       ?  Wrist radial deviation       ?Wrist pronation       ?Wrist supination       ?(Blank rows = not tested) ?  ?Passive ROM ?In degrees Right ?10/19/2021 Right ?11/09/2021 Right  ?11/16/2021 Right ?12/06/2021 Rt ?3/27/  ?Shoulder flexion 130  120 134 145 145  ?Shoulder extension 35       ?Shoulder abduction 105 104  110 120 120  ?Shoulder adduction         ?Shoulder internal rotation 80 shoulder abd 45 degrees    80 shoulder  ?abducted 45 degrees 78 shoulder  ?abducted 45 degrees  ?Shoulder external rotation 20 ?Shoulder abd 45 degrees 30 ?Shoulder abducted to 45 degrees   30 ?Shoulder abducted to 45 degrees 30 ?Shoulder abducted to 45 degrees  ?Elbow flexion         ?Elbow extension         ?Wrist flexion         ?Wrist extension         ?Wrist ulnar deviation         ?Wrist radial deviation         ?Wrist pronation         ?Wrist supination         ?  ?  ?UPPER EXTREMITY MMT: ?  ?MMT Right ?10/19/2021 Left ?10/19/2021 Right ?12/06/2021   ?Shoulder flexion 2+/5 4+/5 3-/5  ?Shoulder extension 2+/5 4+/5 3-/5  ?Shoulder abduction 2+/5 4+/5 3-/5  ?Shoulder adduction       ?Shoulder internal rotation 2+/5 4+/5 3-/5  ?Shoulder external rotation 2+/5 4+/

## 2021-12-15 ENCOUNTER — Inpatient Hospital Stay: Payer: Medicare HMO

## 2021-12-15 ENCOUNTER — Encounter: Payer: Self-pay | Admitting: Nurse Practitioner

## 2021-12-15 ENCOUNTER — Other Ambulatory Visit: Payer: Self-pay

## 2021-12-15 ENCOUNTER — Encounter: Payer: Self-pay | Admitting: *Deleted

## 2021-12-15 ENCOUNTER — Inpatient Hospital Stay: Payer: Medicare HMO | Admitting: Nurse Practitioner

## 2021-12-15 VITALS — BP 139/80 | HR 79 | Temp 98.7°F | Resp 19 | Ht 61.0 in | Wt 156.8 lb

## 2021-12-15 DIAGNOSIS — C772 Secondary and unspecified malignant neoplasm of intra-abdominal lymph nodes: Secondary | ICD-10-CM

## 2021-12-15 DIAGNOSIS — Z5112 Encounter for antineoplastic immunotherapy: Secondary | ICD-10-CM | POA: Diagnosis not present

## 2021-12-15 DIAGNOSIS — Z79899 Other long term (current) drug therapy: Secondary | ICD-10-CM | POA: Diagnosis not present

## 2021-12-15 DIAGNOSIS — C210 Malignant neoplasm of anus, unspecified: Secondary | ICD-10-CM | POA: Diagnosis not present

## 2021-12-15 LAB — CMP (CANCER CENTER ONLY)
ALT: 13 U/L (ref 0–44)
AST: 22 U/L (ref 15–41)
Albumin: 4 g/dL (ref 3.5–5.0)
Alkaline Phosphatase: 67 U/L (ref 38–126)
Anion gap: 9 (ref 5–15)
BUN: 24 mg/dL — ABNORMAL HIGH (ref 8–23)
CO2: 22 mmol/L (ref 22–32)
Calcium: 9.4 mg/dL (ref 8.9–10.3)
Chloride: 104 mmol/L (ref 98–111)
Creatinine: 0.93 mg/dL (ref 0.44–1.00)
GFR, Estimated: >60 mL/min (ref >60)
Glucose, Bld: 93 mg/dL (ref 70–99)
Potassium: 4.6 mmol/L (ref 3.5–5.1)
Sodium: 135 mmol/L (ref 135–145)
Total Bilirubin: 0.3 mg/dL (ref 0.3–1.2)
Total Protein: 7.5 g/dL (ref 6.5–8.1)

## 2021-12-15 LAB — CORTISOL: Cortisol, Plasma: 6.4 ug/dL

## 2021-12-15 LAB — TSH: TSH: 4.011 u[IU]/mL (ref 0.350–4.500)

## 2021-12-15 MED ORDER — SODIUM CHLORIDE 0.9 % IV SOLN
240.0000 mg | Freq: Once | INTRAVENOUS | Status: AC
  Administered 2021-12-15: 240 mg via INTRAVENOUS
  Filled 2021-12-15: qty 24

## 2021-12-15 MED ORDER — SODIUM CHLORIDE 0.9 % IV SOLN: Freq: Once | INTRAVENOUS | Status: AC

## 2021-12-15 NOTE — Patient Instructions (Signed)
[facility]   ?Discharge Instructions: ?Thank you for choosing [facility] to provide your oncology and hematology care.  ? ?If you have a lab appointment with the [facility], please go directly to the [facility] and check in at the registration area. ?  ?Wear comfortable clothing and clothing appropriate for easy access to any Portacath or PICC line.  ? ?We strive to give you quality time with your provider. You may need to reschedule your appointment if you arrive late (15 or more minutes).  Arriving late affects you and other patients whose appointments are after yours.  Also, if you miss three or more appointments without notifying the office, you may be dismissed from the clinic at the provider?s discretion.    ?  ?For prescription refill requests, have your pharmacy contact our office and allow 72 hours for refills to be completed.   ? ?Today you received the following chemotherapy and/or immunotherapy agents Opdivo    ?  ?To help prevent nausea and vomiting after your treatment, we encourage you to take your nausea medication as directed. ? ?BELOW ARE SYMPTOMS THAT SHOULD BE REPORTED IMMEDIATELY: ?*FEVER GREATER THAN 100.4 F (38 ?C) OR HIGHER ?*CHILLS OR SWEATING ?*NAUSEA AND VOMITING THAT IS NOT CONTROLLED WITH YOUR NAUSEA MEDICATION ?*UNUSUAL SHORTNESS OF BREATH ?*UNUSUAL BRUISING OR BLEEDING ?*URINARY PROBLEMS (pain or burning when urinating, or frequent urination) ?*BOWEL PROBLEMS (unusual diarrhea, constipation, pain near the anus) ?TENDERNESS IN MOUTH AND THROAT WITH OR WITHOUT PRESENCE OF ULCERS (sore throat, sores in mouth, or a toothache) ?UNUSUAL RASH, SWELLING OR PAIN  ?UNUSUAL VAGINAL DISCHARGE OR ITCHING  ? ?Items with * indicate a potential emergency and should be followed up as soon as possible or go to the Emergency Department if any problems should occur. ? ?Please show the CHEMOTHERAPY ALERT CARD or IMMUNOTHERAPY ALERT CARD at check-in to the  Emergency Department and triage nurse. ? ?Should you have questions after your visit or need to cancel or reschedule your appointment, please contact [facility]  Dept: [phone]  and follow the prompts.  Office hours are 8:00 a.m. to 4:30 p.m. Monday - Friday. Please note that voicemails left after 4:00 p.m. may not be returned until the following business day.  We are closed weekends and major holidays. You have access to a nurse at all times for urgent questions. Please call the main number to the clinic Dept: [phone] and follow the prompts. ? ? ?For any non-urgent questions, you may also contact your provider using MyChart. We now offer e-Visits for anyone 52 and older to request care online for non-urgent symptoms. For details visit [email or web address]. ?  ?Also download the MyChart app! Go to the app store, search "MyChart", open the app, select [facility], and log in with your MyChart username and password. ? ?Due to Covid, a mask is required upon entering the hospital/clinic. If you do not have a mask, one will be given to you upon arrival. For doctor visits, patients may have 1 support person aged 30 or older with them. For treatment visits, patients cannot have anyone with them due to current Covid guidelines and our immunocompromised population.  ? ?Nivolumab injection ?What is this medication? ?NIVOLUMAB (nye VOL ue mab) is a monoclonal antibody. It treats certain types of cancer. Some of the cancers treated are colon cancer, head and neck cancer, Hodgkin lymphoma, lung cancer, and melanoma. ?This medicine may be used for other purposes; ask  your health care provider or pharmacist if you have questions. ?COMMON BRAND NAME(S): Opdivo ?What should I tell my care team before I take this medication? ?They need to know if you have any of these conditions: ?Autoimmune diseases such as Crohn's disease, ulcerative colitis, or lupus ?Have had or planning to have an  allogeneic stem cell transplant (uses someone else's stem cells) ?History of chest radiation ?Organ transplant ?Nervous system problems such as myasthenia gravis or Guillain-Barre syndrome ?An unusual or allergic reaction to nivolumab, other medicines, foods, dyes, or preservatives ?Pregnant or trying to get pregnant ?Breast-feeding ?How should I use this medication? ?This medication is injected into a vein. It is given in a hospital or clinic setting. ?A special MedGuide will be given to you before each treatment. Be sure to read this information carefully each time. ?Talk to your care team regarding the use of this medication in children. While it may be prescribed for children as young as 12 years for selected conditions, precautions do apply. ?Overdosage: If you think you have taken too much of this medicine contact a poison control center or emergency room at once. ?NOTE: This medicine is only for you. Do not share this medicine with others. ?What if I miss a dose? ?Keep appointments for follow-up doses. It is important not to miss your dose. Call your care team if you are unable to keep an appointment. ?What may interact with this medication? ?Interactions have not been studied. ?This list may not describe all possible interactions. Give your health care provider a list of all the medicines, herbs, non-prescription drugs, or dietary supplements you use. Also tell them if you smoke, drink alcohol, or use illegal drugs. Some items may interact with your medicine. ?What should I watch for while using this medication? ?Your condition will be monitored carefully while you are receiving this medication. ?You may need blood work done while you are taking this medication. ?Do not become pregnant while taking this medication or for 5 months after stopping it. Women should inform their care team if they wish to become pregnant or think they might be pregnant. There is a potential for serious harm to an unborn child.  Talk to your care team for more information. Do not breast-feed an infant while taking this medication or for 5 months after stopping it. ?What side effects may I notice from receiving this medication? ?Side effects that you should report to your care team as soon as possible: ?Allergic reactions--skin rash, itching, hives, swelling of the face, lips, tongue, or throat ?Bloody or black, tar-like stools ?Change in vision ?Chest pain ?Diarrhea ?Dry cough, shortness of breath or trouble breathing ?Eye pain ?Fast or irregular heartbeat ?Fever, chills ?High blood sugar (hyperglycemia)--increased thirst or amount of urine, unusual weakness or fatigue, blurry vision ?High thyroid levels (hyperthyroidism)--fast or irregular heartbeat, weight loss, excessive sweating or sensitivity to heat, tremors or shaking, anxiety, nervousness, irregular menstrual cycle or spotting ?Kidney injury--decrease in the amount of urine, swelling of the ankles, hands, or feet ?Liver injury--right upper belly pain, loss of appetite, nausea, light-colored stool, dark yellow or brown urine, yellowing skin or eyes, unusual weakness or fatigue ?Low red blood cell count--unusual weakness or fatigue, dizziness, headache, trouble breathing ?Low thyroid levels (hypothyroidism)--unusual weakness or fatigue, increased sensitivity to cold, constipation, hair loss, dry skin, weight gain, feelings of depression ?Mood and behavior changes-confusion, change in sex drive or performance, irritability ?Muscle pain or cramps ?Pain, tingling, or numbness in the hands or feet, muscle weakness,  trouble walking, loss of balance or coordination ?Red or dark brown urine ?Redness, blistering, peeling, or loosening of the skin, including inside the mouth ?Stomach pain ?Unusual bruising or bleeding ?Side effects that usually do not require medical attention (report to your care team if they continue or are bothersome): ?Bone pain ?Constipation ?Loss of  appetite ?Nausea ?Tiredness ?Vomiting ?This list may not describe all possible side effects. Call your doctor for medical advice about side effects. You may report side effects to FDA at 1-800-FDA-1088. ?Where should I keep my medica

## 2021-12-15 NOTE — Progress Notes (Signed)
?Ashley Moore ?OFFICE PROGRESS NOTE ? ? ?Diagnosis: Anal cancer ? ?INTERVAL HISTORY:  ? ?Ashley Moore returns as scheduled.  She completed another cycle of nivolumab 12/01/2021.  In general she is feeling well.  No rash.  Occasional loose stools.  No nausea or vomiting.  Leg swelling continues to be improved.  She has a good appetite.  She denies significant pain.  Main complaint is fatigue. ? ?Objective: ? ?Vital signs in last 24 hours: ? ?Blood pressure 139/80, pulse 79, temperature 98.7 ?F (37.1 ?C), temperature source Tympanic, resp. rate 19, height '5\' 1"'$  (1.549 m), weight 156 lb 12.8 oz (71.1 kg), SpO2 100 %. ?  ? ?HEENT: No thrush or ulcers. ?Lymphatics: No palpable cervical or supraclavicular lymph nodes. ?Resp: Faint rales at the lung bases. ?Cardio: Regular rate and rhythm. ?GI: No hepatosplenomegaly. ?Vascular: Trace edema lower leg bilaterally. ?Skin: No rash. ? ? ?Lab Results: ? ?Lab Results  ?Component Value Date  ? WBC 4.9 12/01/2021  ? HGB 11.3 (L) 12/01/2021  ? HCT 36.2 12/01/2021  ? MCV 88.3 12/01/2021  ? PLT 221 12/01/2021  ? NEUTROABS 3.9 12/01/2021  ? ? ?Imaging: ? ?No results found. ? ?Medications: I have reviewed the patient's current medications. ? ?Assessment/Plan: ?Anal cancer ?CT abdomen/pelvis 10/21/2016-thickening of the anus extending to the junction between the anus and rectum with a large stool ball in the rectum and mild fat stranding posterior to the rectum. Enlarged lymph nodes in the right and left inguinal regions. A few mildly prominent nodes seen posterior to the rectum. 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 ?PET scan 83/38/[id number]-[id number] hypermetabolic anal mass with hypermetabolic metastases to the groin region bilaterally, left pelvic sidewall and presacral space. ?Initiation of radiation and cycle 1 5-FU/mitomycin C 11/14/2016 ?Cycle 2 5-FU/mitomycin C 12/12/2016 (5-FU dose reduced due to mucositis, diarrhea, skin breakdown) ?Radiation  completed 12/23/2016 ?CT abdomen/pelvis 04/14/2017-resolution of anal mass and bilateral inguinal lymphadenopathy. No residual tumor seen. ?CT abdomen/pelvis 01/28/2020-right hydroureteronephrosis, transition at the level of the mid right ureter, retroperitoneal lymphadenopathy ?CT renal stone study 01/28/2020-severe right hydronephrosis and proximal right hydroureter, 8 mm area of increased attenuation in the proximal right ureter-stone versus soft tissue mass ?PET 02/13/2020-hypermetabolic right retroperitoneal nodal metastases in the aortocaval and posterior pericaval chains, nonspecific mild anal wall hypermetabolism, right nephroureteral stent ?02/21/2020 anorectal exam per Dr. Ashley Jacobs radiation changes of the skin around the anal margin.  Posterior midline smooth scarring.  Mildly stenotic.  Stenosis seems better.  No palpable concerning lesions.  Entire anal canal and distal rectum feels smooth and healthy.  Partial anoscopy performed with no lesions in the anal canal. ?Xeloda/radiation beginning 03/02/2020, completed 04/13/2020 ?CT abdomen/pelvis 06/17/2020-resolution of right retroperitoneal lymphadenopathy, no remaining pathologically enlarged lymph nodes, residual mild right hydronephrosis, no evidence of progressive disease, stable anal wall thickening ?Digital rectal exam by Dr. Quentin Cornwall 08/26/2020-posterior midline smooth scarring.  Mildly stenotic.  Stenosis seems better.  No palpable concerning lesions.  Anal canal feels smooth without palpable concern.  Unable to tolerate anoscopy.  Next visit in 6 months for surveillance. ?CT abdomen/pelvis 10/28/2020-no evidence of local recurrence or metastatic disease.  Stable mild rectal wall thickening.  New diffuse bladder wall thickening possibly related to prior radiation ?CT abdomen/pelvis 01/16/2021- recurrent right-sided hydronephrosis and proximal right hydroureter status post removal of right ureter stent, new soft tissue nodule along the course of the  right ureter between the right psoas and IVC suspicious for recurrent lymphadenopathy ?PET 02/12/2021-small focus of hypermetabolism at the anorectal  junction without a definable mass, right retroperitoneal nodal metastasis with obstruction of the proximal right ureter, right middle lobe hypermetabolic nodule, hypermetabolism in the right hilum without a defined nodal mass, hypermetabolic left supraclavicular and left axillary nodes ?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 ?Cycle 1 Taxol/carboplatin 03/11/2021 ?Cycle 2 Taxol/carboplatin 03/31/2021, Fulphila ?Cycle 3 Taxol/carboplatin 04/21/2021, Fulphila ?CTs 05/11/2021-decrease size of FDG avid nodule in right middle lobe, right middle lobe subpleural nodule slightly enlarged, decreased in size of left axillary and subpectoral nodes, unchanged FDG avid right iliac nodes ?Cycle 4 Taxol/carboplatin 05/12/2021, G-CSF discontinued secondary to poor tolerance, Taxol and carboplatin dose reduced ?Cycle 5 Taxol/carboplatin 06/02/2021 ?Cycle 6 carboplatin 07/01/2021, Taxol held due to neuropathy ?Cycle 7 carboplatin 07/29/2021, Taxol held due to neuropathy ?CTs 08/16/2021-new liver lesion, increase in right psoas lymph node, unchanged right hydronephrosis, new mediastinal and right hilar lymphadenopathy, enlargement of bilateral pulmonary nodules and at least one new nodule, new subcarinal node ?Cycle 1 nivolumab 08/25/2021 ?Cycle 2 nivolumab 09/08/2021 ?Cycle 3 nivolumab 09/22/2021 ?Cycle 4 nivolumab 10/06/2021 ?Cycle 5 nivolumab 10/20/2021 ?Cycle 6 nivolumab 11/04/2021 ?CTs 11/12/2021-resolution of lung nodules, decreased size of right psoas node, no remaining mediastinal lymphadenopathy, resolution of right liver lesion ?Cycle 7 nivolumab 11/17/2021 ?Cycle 8 nivolumab 12/01/2021 ?Cycle 9 nivolumab 12/15/2021 ?Left labial lesions. Question direct extension from the anal cancer versus metastatic disease from anal cancer versus a  separate malignant process. 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 ?Multiple family members with breast cancer. ?History of hypokalemia-likely secondary to decreased nutritional intake and diarrhea; potassium in normal range 01/16/2017. No longer taking a potassium supplement. ?Right hydroureteronephrosis on CT 01/28/2020, status post a cystoscopy/pyelogram 01/28/2020 confirming extrinsic compression of the right ureter, status post stent placement ?Right ureter stent exchange 06/12/2020 ?Right ureter stent removed 01/13/2021 ?  ?8.  Admission 01/07/2021 with Klebsiella pneumoniae urosepsis/bacteremia  ?9.  Hospital admission 05/15/2021 with UTI/possible right pyelonephritis ?  ? ?Disposition: Ms. Krizek appears stable.  She has completed 8 cycles of nivolumab.  There is no clinical evidence of disease progression.  Plan to proceed with cycle 9 today as scheduled. ? ?Her main complaint is fatigue.  We are checking a TSH and cortisol level.  Chemistry panel is pending. ? ?She will return for follow-up in 2 weeks.  We are available to see her sooner if needed ? ? ? ? ? ?Ned Card ANP/GNP-BC  ? ?12/15/2021  ?1:54 PM ? ? ? ? ? ? ? ?

## 2021-12-15 NOTE — Progress Notes (Signed)
Patient seen by Ned Card NP today ? ?Vitals are within treatment parameters. ? ?Labs reviewed by Ned Card NP and are within treatment parameters. ?CBC not needed. ? ?Per physician team, patient is ready for treatment and there are NO modifications to the treatment plan.Additional labs added today that could not be run off CMP tube. Phlebotomist will draw in infusion after IV is started. Do not need to await results to treat. ?

## 2021-12-15 NOTE — Progress Notes (Signed)
Patient presents for treatment. RN assessment completed along with the following: ? ?Labs/vitals reviewed - Yes, and within treatment parameters.   ?Weight within 10% of previous measurement - Yes ?Informed consent completed and reflects current therapy/intent - Yes, on date 08/25/21             ?Provider progress note reviewed - Yes, today's provider note was reviewed. ?Treatment/Antibody/Supportive plan reviewed - Yes, and there are no adjustments needed for today's treatment. ?S&H and other orders reviewed - Yes, and there are no additional orders identified. ?Previous treatment date reviewed - Yes, and the appropriate amount of time has elapsed between treatments. ?Clinic Hand Off Received from - Cristy Friedlander, RN ? ? ?Patient to proceed with treatment.  ? ? ?

## 2021-12-20 ENCOUNTER — Other Ambulatory Visit: Payer: Self-pay | Admitting: Nurse Practitioner

## 2021-12-20 DIAGNOSIS — C210 Malignant neoplasm of anus, unspecified: Secondary | ICD-10-CM

## 2021-12-20 DIAGNOSIS — C772 Secondary and unspecified malignant neoplasm of intra-abdominal lymph nodes: Secondary | ICD-10-CM

## 2021-12-21 ENCOUNTER — Ambulatory Visit: Payer: Medicare HMO | Admitting: Physical Therapy

## 2021-12-21 ENCOUNTER — Encounter: Payer: Self-pay | Admitting: Physical Therapy

## 2021-12-21 DIAGNOSIS — R293 Abnormal posture: Secondary | ICD-10-CM | POA: Diagnosis not present

## 2021-12-21 DIAGNOSIS — M6281 Muscle weakness (generalized): Secondary | ICD-10-CM | POA: Diagnosis not present

## 2021-12-21 DIAGNOSIS — M25611 Stiffness of right shoulder, not elsewhere classified: Secondary | ICD-10-CM | POA: Diagnosis not present

## 2021-12-21 DIAGNOSIS — R600 Localized edema: Secondary | ICD-10-CM | POA: Diagnosis not present

## 2021-12-21 DIAGNOSIS — M25511 Pain in right shoulder: Secondary | ICD-10-CM | POA: Diagnosis not present

## 2021-12-21 DIAGNOSIS — G8929 Other chronic pain: Secondary | ICD-10-CM

## 2021-12-21 NOTE — Therapy (Addendum)
OUTPATIENT PHYSICAL THERAPY TREATMENT NOTE   Patient Name: Ashley Moore MRN: [id number] DOB:26-Apr-1951, 71 y.o., female Today's Date: 12/21/2021  PCP: [name], MD REFERRING PROVIDER: Persons, [name], [place]   PT End of Session - 12/21/21 1531     Visit Number 8    Number of Visits 12    Date for PT Re-Evaluation 01/11/22    Authorization Type [employer] 12 visits applied for on 10/19/2021 to 12/17/2021    Progress Note Due on Visit 10    PT Start Time 2947    PT Stop Time 1600    PT Time Calculation (min) 43 min    Activity Tolerance Patient tolerated treatment well    Behavior During Therapy [facility] for tasks assessed/performed                  Past Medical History:  Diagnosis Date   Arthritis    Bowen's disease    excised 1992   Chronic low back pain    Chronic radiation cystitis    DDD (degenerative disc disease), cervical    Family history of breast cancer    Family history of lung cancer    Family history of non-Hodgkin's lymphoma    Family history of ovarian cancer    Headache    Hx of varicella    Hydronephrosis of right kidney    urologist--- dr Tresa Moore,  malignant,  treated with ureter stent   Hypothyroidism    followed by pcp   Lower urinary tract symptoms (LUTS)    01-12-2021  per pt treated with accupuncture at dr Tresa Moore office   Lymphedema of both lower extremities    PICC (peripherally inserted central catheter) in place 01/11/2021   placed at [facility] in [place], [place] for IV antibiotics   Positive blood culture 01/08/2021   Klebsiella Pneumaniae,  treated with daily IV antibiotic   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[facility]) oncologist--- dr Benay Spice   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,  invasive SCC , completed chemo/ radiation 12-23-2016;  recurrent metastatsis retroperitoneal lymphdenopathy,  completed radiation 04-13-2020 residual right ureteral uropathy obstruction   Retroperitoneal lymphadenopathy    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/  2021   Sepsis due to Klebsiella pneumoniae ([place]) 01/07/2021   pt admitted to [facility] in [place],  dx sepsis secondary to acute pyelonephritis with bacteremia , positive blood culture,  discharged 01-11-2021 home daily IV antibiotic   Wears glasses    Past Surgical History:  Procedure Laterality Date   BUNIONECTOMY Right yrs ago   [place] Right 06/12/2020   Procedure: CYSTOSCOPY WITH RETROGRADE PYELOGRAM/URETERAL STENT EXCHANGE;  Surgeon: Alexis Frock, MD;  Location: [facility];  Service: Urology;  Laterality: Right;   CYSTOSCOPY W/ URETERAL STENT PLACEMENT Right 01/13/2021   Procedure: CYSTOSCOPY WITH RETROGRADE PYELOGRAM/URETERAL STENT REMOVALRIGHT;  Surgeon: Alexis Frock, MD;  Location: [facility];  Service: Urology;  Laterality: Right;  [place], URETEROSCOPY AND STENT PLACEMENT Right 01/28/2020   Procedure: CYSTOSCOPY WITH RETROGRADE PYELOGRAM, URETEROSCOPY AND STENT PLACEMENT;  Surgeon: Alexis Frock, MD;  Location: [facility];  Service: Urology;  Laterality: Right;   IR GENERIC HISTORICAL  11/14/2016   IR US GUIDE VASC ACCESS RIGHT 11/14/2016 [facility]   IR GENERIC HISTORICAL  11/14/2016   IR FLUORO GUIDE CV LINE RIGHT 11/14/2016 [facility]   IR GENERIC HISTORICAL  12/12/2016   IR US GUIDE VASC ACCESS RIGHT  12/12/2016 [name], MD [facility]   IR GENERIC HISTORICAL  12/12/2016   IR FLUORO GUIDE CV LINE RIGHT 12/12/2016 [name], MD [facility]   SKIN CANCER EXCISION  1990   bowens disease   Patient Active Problem List   Diagnosis Date Noted   Primary osteoarthritis, 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/28/2021   Atypical chest pain 06/29/2021   Intractable abdominal pain 05/16/2021   Acute pyelonephritis 05/16/2021   Acute cystitis without hematuria 05/16/2021   Hydronephrosis of right kidney 05/16/2021   Obstructive uropathy 05/16/2021   Hypothyroidism 05/16/2021    Hyponatremia 05/16/2021   Obesity (BMI 30-39.9) 05/16/2021   Goals of care, counseling/discussion 03/01/2021   SIRS (systemic inflammatory response syndrome) ([place]) 01/13/2021   Pyelonephritis 01/13/2021   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   Genetic testing 01/27/2020   Family history of breast cancer    Family history of lung cancer    Family history of ovarian cancer    Family history of non-Hodgkin's lymphoma    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   Port catheter in place 11/21/2016   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   Anal cancer ([place]) 11/03/2016   Cystitis 05/06/2015   Degenerative cervical disc 04/08/2015   Trigger point of neck 03/26/2015   Pain in joint, ankle and foot 04/23/2013   Visit for preventive health examination 02/13/2013   Routine gynecological examination 02/13/2013   Family history of breast cancer in first degree relative 02/13/2013   Rash and nonspecific skin eruption 02/13/2013   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   VITAMIN D DEFICIENCY 06/25/2008   NECK PAIN 06/02/2008   FOOT PAIN 06/02/2008   Osteopenia 06/02/2008    REFERRING DIAG: M25.511,G89.29 (ICD-10-CM) - Chronic right shoulder pain   THERAPY DIAG:  Stiffness of right shoulder, not elsewhere classified  Abnormal posture  Chronic right shoulder pain  Muscle weakness (generalized)  Localized edema  PERTINENT HISTORY: Arthritis Bowen's diseaseexcised 1992 Chronic low back pain Chronic radiation cystitis DDD (degenerative disc disease), cervical Family history of breast cancer Family history of lung cancer Family history of non-Hodgkin's lymphoma Family history of ovarian cancer Headache Hx of varicella Hydronephrosis of right kidney rectal cancer   PRECAUTIONS: Other: infusion therapy for Cancer , fall precuations   SUBJECTIVE: Pt arriving to therapy reporting increased stiffness in her Rt shoulder where she had difficulty with bathing and dressing this  morning. Pt stating she joined [employer] center and worked with a Physiological scientist and she believes she over did it. Pt stating she can't wait until she can start aquatic fitness classes.   PAIN:  Are you having pain? Yes NPRS scale: 6-7/10 Pain location: anterior shoulder and lateral Rt shoulder into elbow Pain orientation: Right  PAIN TYPE: aching Pain description: constant  Aggravating factors: reaching upward Relieving factors: resting and heat    OBJECTIVE:      PATIENT SURVEYS:  FOTO: 51% on 10/19/2021  UPPER EXTREMITY AROM/PROM:   Active ROM  In supine Right 10/19/2021 Left 10/19/2021   Shoulder flexion 120 164   Shoulder extension 30 45     Shoulder abduction 98 160   Shoulder adduction       Shoulder internal rotation 75 shoulder abd 45 degrees 75   Shoulder external rotation 15 Shoulder abd 45 degrees 80   Elbow flexion 140   145   Elbow extension 0 0   Wrist flexion       Wrist extension       Wrist ulnar deviation  Wrist radial deviation       Wrist pronation       Wrist supination       (Blank rows = not tested)   Passive ROM In degrees Right 10/19/2021 Right 11/09/2021 Right  11/16/2021 Right 12/06/2021 Rt 12/13/21  Shoulder flexion 130  120 134 145 145  Shoulder extension 35       Shoulder abduction 105 104  110 120 120  Shoulder adduction         Shoulder internal rotation 80 shoulder abd 45 degrees    80 shoulder  abducted 45 degrees 78 shoulder  abducted 45 degrees  Shoulder external rotation 20 Shoulder abd 45 degrees 30 Shoulder abducted to 45 degrees   30 Shoulder abducted to 45 degrees 30 Shoulder abducted to 45 degrees  Elbow flexion         Elbow extension         Wrist flexion         Wrist extension         Wrist ulnar deviation         Wrist radial deviation         Wrist pronation         Wrist supination             UPPER EXTREMITY MMT:   MMT Right 10/19/2021 Left 10/19/2021 Right 12/06/2021  Shoulder  flexion 2+/5 4+/5 3-/5  Shoulder extension 2+/5 4+/5 3-/5  Shoulder abduction 2+/5 4+/5 3-/5  Shoulder adduction       Shoulder internal rotation 2+/5 4+/5 3-/5  Shoulder external rotation 2+/5 4+/5 3-/5  Middle trapezius       Lower trapezius       Elbow flexion       Elbow extension       Wrist flexion       Wrist extension       Wrist ulnar deviation       Wrist radial deviation       Wrist pronation       Wrist supination       Grip strength (lbs) 31.8 pounds 48 pounds     (Blank rows = not tested)                  TODAY'S TREATMENT:   12/21/2021:  TherEx: UBE L2 x 6 minutes (3 minutes each direction)  UE ranger: flexion x 2 minutes           Scaption x 2 minutes          Circles clock wise and counter clock wise each x 20  Seated shoulder flexion with red physioball x10 Seated downward pressure held for isometric contraction on red physioball x 10    Manual:    STM to bilateral cervical paraspinals, bilateral upper trap, bilateral levator scapulae, bilateral shomboids. Scapular mobs performed on Rt UE.   PROM: Rt UE in all planes, Grade 2-3 GH inferior mobs and AP mobs performed   12/13/2021:  Therapeutic Exercise:  Aerobic:  Supine: shoulder flexion with 1# bar x15 Prone:  Seated:   UE ranger x 10 Rt UE, flexion, abduction, circles both directions   Rt shoulder isometrics x 5 holding 5-10 seconds   Rt shoulder flexion AAROM with 1# bar x 20   Shoulder isometrics: flexion, extension, IR and ER  Standing:   Rows: L2 theraband x 15     Manual Therapy: PROM right shoulder, Grade 2-3 AP mobs Modalities: Moist heat to  Rt shoulder x 5 minutes   12/06/2021: Therapeutic Exercise:  Aerobic:  Supine: Prone:  Seated: table slides: scaption and ER x 10 holding 5-10 seconds   UE ranger x 10 Rt UE, flexion, abduction, circles both directions   Rt shoulder isometrics x 5 holding 5-10 seconds   Rt shoulder flexion AAROM with 1# bar x 20  Standing:      Manual  Therapy: PROM right shoulder, Grade 2-3 AP mobs, STM to Rt shoulder and upper trap    11/16/2021: Therapeutic Exercise:  Aerobic: UBE: 8 minutes (4 minutes each direction) Supine: Prone:  Seated: table slides: scaption and ER x 10 holding 5-10 seconds   UE ranger x 10 Rt UE   Rt shoulder flexion AAROM with 1# bar x 20   Pulleys flexion: x 2 minutes, scaption x 2  Standing:      Neuromuscular Re-education: Manual Therapy: PROM right shoulder Grade 2-3 AP mobs Therapeutic Activity: Self Care: Trigger Point Dry Needling:  Modalities:       PATIENT EDUCATION: 11/01/2021 Education details:added isometrics to pt's HEP  Person educated: Patient Education method: Explanation Education comprehension: verbalized understanding, returned demonstration, and verbal cues required     HOME EXERCISE PROGRAM: Access Code: [email or web address] URL: [email or web address] Date: 10/19/2021 Prepared by: Kearney Hard   Exercises Seated Shoulder Flexion Towel Slide at Table Top - 2-3 x daily - 7 x weekly - 1 sets - 10 reps - 5-10 seocnds hold Seated Shoulder External Rotation PROM on Table - 2-3 x daily - 7 x weekly - 1 sets - 10 reps - 5-10 seconds hold Seated Scapular Retraction - 2-3 x daily - 7 x weekly - 1 sets - 10 reps - 5-10 seconds hold     ASSESSMENT:   CLINICAL IMPRESSION: 12/21/2021:  Pt reporting pain today and stiffness in Rt shoulder. Pt with limited tolerance to exercises due to increased pain. Pt did report decreased pain and was able to demonstrate improved AROM following STM/Manual therpay. Continue skilled PT as pt tolerates to maximize pt's function.    12/13/2021:  Pt arriving to therapy reporting more shoulder stiffness and pain of 3/10 in Rt shoulder. Pt tolerating exercises  well with limited PROM in shoulder ER, IR and abduction. Pt reporting feeling more mobile at end of session following mobs and PROM. Continue skilled PT as pt tolerates.   12/06/2021:  Pt  arriving today reporting 6/10 Rt shoulder pain. Pt tolerating limited exercises due to increased pain. Pt reporting 4-5/10 pain at end of session following mobs and soft tissue. Pt has made progress with ROM since initial visit. Continue skilled Pt to maximize pt's function.     11/16/2021:  Pt arriving to therapy reporting a good CT scan report. Pt also reporting she is now able to reach the side of her head for washing her hair. Pt still with pain reported with end ranges of PROM and active motion of her right shoulder. Continue skilled PT to maximize pt's function.    11/09/2021:  Pt arriving today reporting increased pain of 8/10 at times in her right shoulder. Pt stating she received an injection from Dr. Durward Fortes last week  on 11/01/2021. Pt stating it helped for a few days but pt feels now her pain returned again yesterday. Pt stating she was sick from her infusion on Thursday. Pt stating she has been taking over the counter pain meds as needed. Pt is now able to reach the left side of her face using her right  UE. Pt tolerating exercises with pain reported at end range movements in right shoulder. Continue skilled PT to maximize function.       REHAB POTENTIAL: fair, due to co-morbidities   CLINICAL DECISION MAKING: Evolving/moderate complexity   EVALUATION COMPLEXITY: Moderate     GOALS: Goals reviewed with patient? Yes   SHORT TERM GOALS:   STG Name Target Date Goal status  1 Pt will be independent with her initial HEP.    11/16/2021 Met 11/09/2021  2 Pt will be able to perform 5 time sit to stand with no UE support </= 15 seconds Baseline:  11/16/2021 Ongoing 12/13/2021    LONG TERM GOALS:    LTG Name Target Date Goal status  1 PT will be independent in her advanced HEP.  Baseline: 01/11/2022 Ongoing 12/21/2021  2 Pt will improve FOTO score to >/= 63 % Baseline: 51% on 10/19/2021 01/11/2022 Ongoing 12/21/2021  3 Pt will improve her right shoulder ER to >/= 30 degrees to  improve functional mobility.  Baseline: 01/11/2022 Ongoing 12/21/2021  4 Pt will be able to report pain of </= 2/10 with ADL's using her right UE.  Baseline: 01/11/2022 Ongoing 12/21/2021                               PLAN: PT FREQUENCY: 1x/ week for 12 weeks   PT DURATION: 12 weeks   PLANNED INTERVENTIONS: Therapeutic exercises, Therapeutic activity, Neuro Muscular re-education, Balance training, Gait training, Patient/Family education, Joint mobilization, Dry Needling, Cryotherapy, Moist heat, Taping, Vasopneumatic device, and Manual therapy   PLAN FOR NEXT SESSION:  shoulder isometrics, shoulder ROM, mobs, PROM, gentle stretching and strengthening as tolerated, UBE as tolerated      Oretha Caprice, PT, MPT 12/21/2021, 4:00 PM

## 2021-12-22 ENCOUNTER — Telehealth: Payer: Self-pay | Admitting: Pharmacist

## 2021-12-22 NOTE — Progress Notes (Signed)
? ? ?Chronic Care Management ?Pharmacy Assistant  ? ?Name: Ashley Moore  MRN: [id number] DOB: 1951/06/12 ? ?Reason for Encounter: Disease State General Assessment ?  ?Recent office visits:  ?11/18/21 Criselda Peaches, LPN - Patient presented for Medicare Annual wellness exam. No medication changes. ? ?10/25/21 Panosh, Standley Brooking, MD - Patient presented for Runny nose and other concerns. Prescribed Azelastine HCL. ? ?10/14/21 Lucretia Kern, DO - Patient presented for Nasal sinus congestion and other concerns. Stopped Sulfamethoxazole. ? ?09/14/21 Lucretia Kern, DO - Patient presented via video for Cough and other concerns. Prescribed Benzonatate Prescribed Doxycycline Changed Lorazepam Stopped Multivitamins. ? ?Recent consult visits:  ?12/21/21 Oretha Caprice, PT - Patient presented for Stiffness of right shoulder not elsewhere classified and other concerns. No medication changes. ? 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 No medication changes. ? ?12/15/21 [name], NP (Hematology and Oncology) - Patient presented for Anal cancer and other concerns. No medication changes. ? ?12/13/21 Oretha Caprice, PT - Patient presented for Stiffness of right shoulder not elsewhere classified and other concerns. No medication changes. ? ?12/06/21 Oretha Caprice, PT - Patient presented for Stiffness of right shoulder not elsewhere classified and other concerns. No medication changes. ? 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 No medication changes. ? ?12/01/21 [name], NP (Hematology and Oncology) - Patient presented for Anal cancer and other concerns. No medication changes. ? ?11/17/21 [name], MD (Oncology) - Patient presented for Anal cancer. Stopped Doxycycline Stopped Loratadine Stopped Nutritional Supplements. ? ?11/12/21 Patient presented for CT Chest ABD/Pelvis W  Contrast ? ?11/04/21 [name], MD (Oncology) - Patient presented for Anal cancer. Stopped Benzonatate ? ?10/28/21 Garald Balding, MD (Orthopedic Surg) - Patient presented for Primary Osteoarthritis of right shoulder. No medication changes. ? ?10/20/21 [name], NP (Hematology and Oncology) - Patient presented for Anal cancer and other concerns. No medication changes. ? ?10/06/21 [name], MD (Oncology) - Patient presented for Anal cancer. No medication changes. ? ?09/27/21 Persons, [name], [place] (Orthopedic Surg) - Patient presented for Chronic right shoulder pain and other concerns. No medication changes. PT ordered. ? ?09/22/21 [name], NP (Hematology and Oncology) - Patient presented for Anal cancer and other concerns. No medication changes ? ?09/08/20 [name], NP (Hematology and Oncology) - Patient presented for Anal cancer and other concerns. No medication changes ? ?Hospital visits:  ?None in previous 6 months ? ?Medications: ?Outpatient Encounter Medications as of 12/22/2021  ?Medication Sig  ? acetaminophen (TYLENOL) 500 MG tablet Take 1,000 mg by mouth 2 (two) times daily.  ? Ascorbic Acid (VITAMIN C) 1000 MG tablet Take 1,000 mg by mouth daily.  ? azelastine (ASTELIN) 0.1 % nasal spray Place 2 sprays into both nostrils 2 (two) times daily. Use in each nostril as directed  ? Cholecalciferol (VITAMIN D3 PO) Take 10,000 Units by mouth daily.  ? conjugated estrogens (PREMARIN) vaginal cream Place 1 Applicatorful vaginally daily.  ? diclofenac Sodium (VOLTAREN) 1 % GEL Apply 2 g topically daily. To shoulder  ? levothyroxine (SYNTHROID) 75 MCG tablet Take 1 tablet (75 mcg total) by mouth daily.  ? loratadine (CLARITIN) 10 MG tablet Take 10 mg by mouth daily.  ? LORazepam (ATIVAN) 0.5 MG tablet Take 0.5 mg by mouth as needed for anxiety (as needed).  ? MYRBETRIQ 50 MG TB24 tablet Take 50 mg by  mouth daily.  ? OVER THE COUNTER MEDICATION Place 1 drop into both eyes daily as needed (dry  eyes).  ? polyethylene glycol (MIRALAX) 17 g packet Take 17 g by mouth daily as needed for mild constipation. (Patient taking differently: Take 17 g by mouth daily.)  ? Prenatal Vit-Fe Fumarate-FA (PRENATAL PO) Take by mouth daily.  ? protein supplement shake (PREMIER PROTEIN) LIQD Take 11 oz by mouth daily.  ? ?No facility-administered encounter medications on file as of 12/22/2021.  ?Notes: ?Call to patient she reports she recently came dow with a UTI over the past weekend and will be seeing her OBGYN this week for it. She reports she thinks her immunotherapy is working her last scan showed less spots and the ones that were there had shrunk to 1/2 the size they were and she is to return in 4-6 weeks for an update. She finally reports that she has joined the fitness center at the hospital and has gone once and is scheduled to return on today. She reports no concerns or questions at this time. ? ?Care Gaps: ?AWV - 3/23 ?CCM- Declined at this time ?Star Rating Drugs: ?None ? ? ? ?Ned Clines CMA ?Clinical Pharmacist Assistant ?[phone] ? ?

## 2021-12-25 ENCOUNTER — Other Ambulatory Visit: Payer: Self-pay | Admitting: Oncology

## 2021-12-28 ENCOUNTER — Ambulatory Visit (INDEPENDENT_AMBULATORY_CARE_PROVIDER_SITE_OTHER): Payer: Medicare HMO | Admitting: Physical Therapy

## 2021-12-28 ENCOUNTER — Encounter: Payer: Self-pay | Admitting: Physical Therapy

## 2021-12-28 DIAGNOSIS — G8929 Other chronic pain: Secondary | ICD-10-CM

## 2021-12-28 DIAGNOSIS — M25611 Stiffness of right shoulder, not elsewhere classified: Secondary | ICD-10-CM | POA: Diagnosis not present

## 2021-12-28 DIAGNOSIS — M25511 Pain in right shoulder: Secondary | ICD-10-CM | POA: Diagnosis not present

## 2021-12-28 DIAGNOSIS — M6281 Muscle weakness (generalized): Secondary | ICD-10-CM | POA: Diagnosis not present

## 2021-12-28 DIAGNOSIS — R293 Abnormal posture: Secondary | ICD-10-CM

## 2021-12-28 NOTE — Therapy (Addendum)
OUTPATIENT PHYSICAL THERAPY TREATMENT NOTE   Patient Name: Ashley Moore MRN: [id number] DOB:1951-08-18, 71 y.o., female Today's Date: 12/28/2021  PCP: [name], MD REFERRING PROVIDER: Persons, [name], [place]   PT End of Session - 12/28/21 1534     Visit Number 9    Number of Visits 12    Date for PT Re-Evaluation 01/11/22    Authorization Type [employer] 12 visits applied for on 10/19/2021 to 12/17/2021    Progress Note Due on Visit 10    PT Start Time 1518    PT Stop Time 1600    PT Time Calculation (min) 42 min    Activity Tolerance Patient tolerated treatment well    Behavior During Therapy [facility] for tasks assessed/performed                   Past Medical History:  Diagnosis Date   Arthritis    Bowen's disease    excised 1992   Chronic low back pain    Chronic radiation cystitis    DDD (degenerative disc disease), cervical    Family history of breast cancer    Family history of lung cancer    Family history of non-Hodgkin's lymphoma    Family history of ovarian cancer    Headache    Hx of varicella    Hydronephrosis of right kidney    urologist--- dr Tresa Moore,  malignant,  treated with ureter stent   Hypothyroidism    followed by pcp   Lower urinary tract symptoms (LUTS)    01-12-2021  per pt treated with accupuncture at dr Tresa Moore office   Lymphedema of both lower extremities    PICC (peripherally inserted central catheter) in place 01/11/2021   placed at [facility] in [place], [place] for IV antibiotics   Positive blood culture 01/08/2021   Klebsiella Pneumaniae,  treated with daily IV antibiotic   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[facility]) oncologist--- dr Benay Spice   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,  invasive SCC , completed chemo/ radiation 12-23-2016;  recurrent metastatsis retroperitoneal lymphdenopathy,  completed radiation 04-13-2020 residual right ureteral uropathy obstruction   Retroperitoneal lymphadenopathy    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 completed  07/ 2021   Sepsis due to Klebsiella pneumoniae ([place]) 01/07/2021   pt admitted to [facility] in [place],  dx sepsis secondary to acute pyelonephritis with bacteremia , positive blood culture,  discharged 01-11-2021 home daily IV antibiotic   Wears glasses    Past Surgical History:  Procedure Laterality Date   BUNIONECTOMY Right yrs ago   [place] Right 06/12/2020   Procedure: CYSTOSCOPY WITH RETROGRADE PYELOGRAM/URETERAL STENT EXCHANGE;  Surgeon: Alexis Frock, MD;  Location: [facility];  Service: Urology;  Laterality: Right;   CYSTOSCOPY W/ URETERAL STENT PLACEMENT Right 01/13/2021   Procedure: CYSTOSCOPY WITH RETROGRADE PYELOGRAM/URETERAL STENT REMOVALRIGHT;  Surgeon: Alexis Frock, MD;  Location: [facility];  Service: Urology;  Laterality: Right;  [place], URETEROSCOPY AND STENT PLACEMENT Right 01/28/2020   Procedure: CYSTOSCOPY WITH RETROGRADE PYELOGRAM, URETEROSCOPY AND STENT PLACEMENT;  Surgeon: Alexis Frock, MD;  Location: [facility];  Service: Urology;  Laterality: Right;   IR GENERIC HISTORICAL  11/14/2016   IR US GUIDE VASC ACCESS RIGHT 11/14/2016 [facility]   IR GENERIC HISTORICAL  11/14/2016   IR FLUORO GUIDE CV LINE RIGHT 11/14/2016 [facility]   IR GENERIC HISTORICAL  12/12/2016   IR US GUIDE VASC ACCESS  RIGHT 12/12/2016 [name], MD [facility]   IR GENERIC HISTORICAL  12/12/2016   IR FLUORO GUIDE CV LINE RIGHT 12/12/2016 [name], MD [facility]   SKIN CANCER EXCISION  1990   bowens disease   Patient Active Problem List   Diagnosis Date Noted   Primary osteoarthritis, 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/28/2021   Atypical chest pain 06/29/2021   Intractable abdominal pain 05/16/2021   Acute pyelonephritis 05/16/2021   Acute cystitis without hematuria 05/16/2021   Hydronephrosis of right kidney 05/16/2021   Obstructive uropathy 05/16/2021   Hypothyroidism 05/16/2021    Hyponatremia 05/16/2021   Obesity (BMI 30-39.9) 05/16/2021   Goals of care, counseling/discussion 03/01/2021   SIRS (systemic inflammatory response syndrome) ([place]) 01/13/2021   Pyelonephritis 01/13/2021   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   Genetic testing 01/27/2020   Family history of breast cancer    Family history of lung cancer    Family history of ovarian cancer    Family history of non-Hodgkin's lymphoma    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   Port catheter in place 11/21/2016   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   Anal cancer ([place]) 11/03/2016   Cystitis 05/06/2015   Degenerative cervical disc 04/08/2015   Trigger point of neck 03/26/2015   Pain in joint, ankle and foot 04/23/2013   Visit for preventive health examination 02/13/2013   Routine gynecological examination 02/13/2013   Family history of breast cancer in first degree relative 02/13/2013   Rash and nonspecific skin eruption 02/13/2013   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   VITAMIN D DEFICIENCY 06/25/2008   NECK PAIN 06/02/2008   FOOT PAIN 06/02/2008   Osteopenia 06/02/2008    REFERRING DIAG: M25.511,G89.29 (ICD-10-CM) - Chronic right shoulder pain   THERAPY DIAG:  Stiffness of right shoulder, not elsewhere classified  Abnormal posture  Chronic right shoulder pain  Muscle weakness (generalized)  PERTINENT HISTORY: Arthritis Bowen's diseaseexcised 1992 Chronic low back pain Chronic radiation cystitis DDD (degenerative disc disease), cervical Family history of breast cancer Family history of lung cancer Family history of non-Hodgkin's lymphoma Family history of ovarian cancer Headache Hx of varicella Hydronephrosis of right kidney rectal cancer   PRECAUTIONS: Other: infusion therapy for Cancer , fall precuations   SUBJECTIVE: Pt arriving to therapy reporting increased stiffness in her Rt shoulder. Pt still reporting intermittent "clicking" in her Rt shoulder with  abduction/flexion.   PAIN:  Are you having pain? Yes NPRS scale: 6/10 Pain location: anterior shoulder and lateral Rt shoulder into elbow Pain orientation: Right  PAIN TYPE: aching Pain description: constant  Aggravating factors: reaching upward, lateral movements Relieving factors: resting and heat    OBJECTIVE:      PATIENT SURVEYS:  FOTO: 51% on 10/19/2021  UPPER EXTREMITY AROM/PROM:   Active ROM  In supine Right 10/19/2021 Left 10/19/2021  Shoulder flexion 120 164  Shoulder extension 30 45    Shoulder abduction 98 160  Shoulder adduction      Shoulder internal rotation 75 shoulder abd 45 degrees 75  Shoulder external rotation 15 Shoulder abd 45 degrees 80  Elbow flexion 140   145  Elbow extension 0 0  Wrist flexion      Wrist extension      Wrist ulnar deviation      Wrist radial deviation      Wrist pronation      Wrist supination      (Blank rows = not tested)   Passive ROM In degrees Right 10/19/2021 Right 11/09/2021  Right  11/16/2021 Right 12/06/2021 Rt 3/27/  Shoulder flexion 130  120 134 145 145  Shoulder extension 35       Shoulder abduction 105 104  110 120 120  Shoulder adduction         Shoulder internal rotation 80 shoulder abd 45 degrees    80 shoulder  abducted 45 degrees 78 shoulder  abducted 45 degrees  Shoulder external rotation 20 Shoulder abd 45 degrees 30 Shoulder abducted to 45 degrees   30 Shoulder abducted to 45 degrees 30 Shoulder abducted to 45 degrees  Elbow flexion         Elbow extension         Wrist flexion         Wrist extension         Wrist ulnar deviation         Wrist radial deviation         Wrist pronation         Wrist supination             UPPER EXTREMITY MMT:   MMT Right 10/19/2021 Left 10/19/2021 Right 12/06/2021 Rt 12/28/2021  Shoulder flexion 2+/5 4+/5 3-/5 3/5  Shoulder extension 2+/5 4+/5 3-/5 3/5  Shoulder abduction 2+/5 4+/5 3-/5 3/5  Shoulder adduction        Shoulder internal rotation 2+/5  4+/5 3-/5 3/5  Shoulder external rotation 2+/5 4+/5 3-/5 3/5  Middle trapezius        Lower trapezius        Elbow flexion        Elbow extension        Wrist flexion        Wrist extension        Wrist ulnar deviation        Wrist radial deviation        Wrist pronation        Wrist supination        Grip strength (lbs) 31.8 pounds 48 pounds    29.8 pounds  (Blank rows = not tested)                  TODAY'S TREATMENT:   12/28/2021:  TherEx: UBE L2 x 6 minutes (3 minutes each direction)  UE ranger: flexion x 2 minutes           Scaption x 2 minutes          Circles clock wise and counter clock wise each x 20  Seated shoulder flexion with green physioball x10 Seated downward pressure held for isometric contraction on green physioball x 10    Manual:    STM to bilateral cervical paraspinals, bilateral upper trap, bilateral levator scapulae, bilateral shomboids. Scapular mobs performed on Rt UE.   PROM: Rt UE in all planes, Grade 2-3 GH inferior mobs and AP mobs performed    12/21/2021:  TherEx: UBE L2 x 6 minutes (3 minutes each direction)  UE ranger: flexion x 2 minutes           Scaption x 2 minutes          Circles clock wise and counter clock wise each x 20  Seated shoulder flexion with red physioball x10 Seated downward pressure held for isometric contraction on red physioball x 10    Manual:    STM to bilateral cervical paraspinals, bilateral upper trap, bilateral levator scapulae, bilateral shomboids. Scapular mobs performed on Rt UE.   PROM: Rt UE in all  planes, Grade 2-3 GH inferior mobs and AP mobs performed   12/13/2021:  Therapeutic Exercise:  Aerobic:  Supine: shoulder flexion with 1# bar x15 Prone:  Seated:   UE ranger x 10 Rt UE, flexion, abduction, circles both directions   Rt shoulder isometrics x 5 holding 5-10 seconds   Rt shoulder flexion AAROM with 1# bar x 20   Shoulder isometrics: flexion, extension, IR and ER  Standing:   Rows: L2 theraband  x 15     Manual Therapy: PROM right shoulder, Grade 2-3 AP mobs Modalities: Moist heat to Rt shoulder x 5 minutes   12/06/2021: Therapeutic Exercise:  Aerobic:  Supine: Prone:  Seated: table slides: scaption and ER x 10 holding 5-10 seconds   UE ranger x 10 Rt UE, flexion, abduction, circles both directions   Rt shoulder isometrics x 5 holding 5-10 seconds   Rt shoulder flexion AAROM with 1# bar x 20  Standing:      Manual Therapy: PROM right shoulder, Grade 2-3 AP mobs, STM to Rt shoulder and upper trap           PATIENT EDUCATION: 11/01/2021 Education details:added isometrics to pt's HEP  Person educated: Patient Education method: Explanation Education comprehension: verbalized understanding, returned demonstration, and verbal cues required     HOME EXERCISE PROGRAM: Access Code: [email or web address] URL: [email or web address] Date: 10/19/2021 Prepared by: Kearney Hard   Exercises Seated Shoulder Flexion Towel Slide at Table Top - 2-3 x daily - 7 x weekly - 1 sets - 10 reps - 5-10 seocnds hold Seated Shoulder External Rotation PROM on Table - 2-3 x daily - 7 x weekly - 1 sets - 10 reps - 5-10 seconds hold Seated Scapular Retraction - 2-3 x daily - 7 x weekly - 1 sets - 10 reps - 5-10 seconds hold     ASSESSMENT:   CLINICAL IMPRESSION: 12/28/2021:  Pt reporting today is a better day overall with pain of 6/10 in right shoulder. Pt reporting "clicking" sensation/sound with Rt shoulder flexion/abd. Pt stating she has been doing her new isometric exercises in her car which seems to help. Pt with good response to manual therapy today with less pain reported and increased AROM where pt was able to reach her left ear with her Rt UE. Continue skilled PT as tolerates.     12/21/2021:  Pt reporting pain today and stiffness in Rt shoulder. Pt with limited tolerance to exercises due to increased pain. Pt did report decreased pain and was able to demonstrate improved AROM  following STM/Manual therpay. Continue skilled PT as pt tolerates to maximize pt's function.    12/13/2021:  Pt arriving to therapy reporting more shoulder stiffness and pain of 3/10 in Rt shoulder. Pt tolerating exercises  well with limited PROM in shoulder ER, IR and abduction. Pt reporting feeling more mobile at end of session following mobs and PROM. Continue skilled PT as pt tolerates.   12/06/2021:  Pt arriving today reporting 6/10 Rt shoulder pain. Pt tolerating limited exercises due to increased pain. Pt reporting 4-5/10 pain at end of session following mobs and soft tissue. Pt has made progress with ROM since initial visit. Continue skilled Pt to maximize pt's function.           REHAB POTENTIAL: fair, due to co-morbidities   CLINICAL DECISION MAKING: Evolving/moderate complexity   EVALUATION COMPLEXITY: Moderate     GOALS: Goals reviewed with patient? Yes   SHORT TERM GOALS:   STG Name Target Date Goal status  1 Pt will be independent with her initial HEP.    11/16/2021 Met 11/09/2021  2 Pt will be able to perform 5 time sit to stand with no UE support </= 15 seconds Baseline:  11/16/2021 Ongoing 12/13/2021    LONG TERM GOALS:    LTG Name Target Date Goal status  1 PT will be independent in her advanced HEP.  Baseline: 01/11/2022 Ongoing 12/28/2021  2 Pt will improve FOTO score to >/= 63 % Baseline: 51% on 10/19/2021 01/11/2022 Ongoing 12/28/2021  3 Pt will improve her right shoulder ER to >/= 30 degrees to improve functional mobility.  Baseline: 01/11/2022 Ongoing 12/28/2021  4 Pt will be able to report pain of </= 2/10 with ADL's using her right UE.  Baseline: 01/11/2022 Ongoing 12/28/2021                               PLAN: PT FREQUENCY: 1x/ week for 12 weeks   PT DURATION: 12 weeks   PLANNED INTERVENTIONS: Therapeutic exercises, Therapeutic activity, Neuro Muscular re-education, Balance training, Gait training, Patient/Family education, Joint mobilization,  Dry Needling, Cryotherapy, Moist heat, Taping, Vasopneumatic device, and Manual therapy   PLAN FOR NEXT SESSION:  shoulder isometrics, shoulder ROM, mobs, PROM, gentle stretching and strengthening as tolerated, UBE as tolerated      Oretha Caprice, PT, MPT 12/28/2021, 4:10 PM

## 2021-12-29 DIAGNOSIS — C210 Malignant neoplasm of anus, unspecified: Secondary | ICD-10-CM | POA: Diagnosis not present

## 2021-12-29 DIAGNOSIS — Q525 Fusion of labia: Secondary | ICD-10-CM | POA: Diagnosis not present

## 2021-12-29 DIAGNOSIS — N3000 Acute cystitis without hematuria: Secondary | ICD-10-CM | POA: Diagnosis not present

## 2021-12-29 DIAGNOSIS — R8281 Pyuria: Secondary | ICD-10-CM | POA: Diagnosis not present

## 2021-12-29 DIAGNOSIS — N3941 Urge incontinence: Secondary | ICD-10-CM | POA: Diagnosis not present

## 2021-12-30 ENCOUNTER — Inpatient Hospital Stay: Payer: Medicare HMO | Attending: Nurse Practitioner

## 2021-12-30 ENCOUNTER — Encounter: Payer: Self-pay | Admitting: *Deleted

## 2021-12-30 ENCOUNTER — Inpatient Hospital Stay: Payer: Medicare HMO

## 2021-12-30 ENCOUNTER — Inpatient Hospital Stay: Payer: Medicare HMO | Admitting: Oncology

## 2021-12-30 VITALS — BP 136/86 | HR 86 | Temp 98.2°F | Resp 18 | Ht 61.0 in | Wt 158.4 lb

## 2021-12-30 DIAGNOSIS — C210 Malignant neoplasm of anus, unspecified: Secondary | ICD-10-CM | POA: Diagnosis not present

## 2021-12-30 DIAGNOSIS — Z5112 Encounter for antineoplastic immunotherapy: Secondary | ICD-10-CM | POA: Insufficient documentation

## 2021-12-30 DIAGNOSIS — C772 Secondary and unspecified malignant neoplasm of intra-abdominal lymph nodes: Secondary | ICD-10-CM

## 2021-12-30 DIAGNOSIS — Z79899 Other long term (current) drug therapy: Secondary | ICD-10-CM | POA: Insufficient documentation

## 2021-12-30 LAB — CMP (CANCER CENTER ONLY)
ALT: 9 U/L (ref 0–44)
AST: 19 U/L (ref 15–41)
Albumin: 3.8 g/dL (ref 3.5–5.0)
Alkaline Phosphatase: 66 U/L (ref 38–126)
Anion gap: 9 (ref 5–15)
BUN: 23 mg/dL (ref 8–23)
CO2: 23 mmol/L (ref 22–32)
Calcium: 9.4 mg/dL (ref 8.9–10.3)
Chloride: 105 mmol/L (ref 98–111)
Creatinine: 1.01 mg/dL — ABNORMAL HIGH (ref 0.44–1.00)
GFR, Estimated: 59 mL/min — ABNORMAL LOW (ref >60)
Glucose, Bld: 90 mg/dL (ref 70–99)
Potassium: 4.4 mmol/L (ref 3.5–5.1)
Sodium: 137 mmol/L (ref 135–145)
Total Bilirubin: 0.3 mg/dL (ref 0.3–1.2)
Total Protein: 7.1 g/dL (ref 6.5–8.1)

## 2021-12-30 LAB — CBC WITH DIFFERENTIAL (CANCER CENTER ONLY)
Abs Immature Granulocytes: 0.02 10*3/uL (ref 0.00–0.07)
Basophils Absolute: 0 10*3/uL (ref 0.0–0.1)
Basophils Relative: 1 %
Eosinophils Absolute: 0.1 10*3/uL (ref 0.0–0.5)
Eosinophils Relative: 3 %
HCT: 35.1 % — ABNORMAL LOW (ref 36.0–46.0)
Hemoglobin: 11 g/dL — ABNORMAL LOW (ref 12.0–15.0)
Immature Granulocytes: 1 %
Lymphocytes Relative: 13 %
Lymphs Abs: 0.5 10*3/uL — ABNORMAL LOW (ref 0.7–4.0)
MCH: 27.4 pg (ref 26.0–34.0)
MCHC: 31.3 g/dL (ref 30.0–36.0)
MCV: 87.5 fL (ref 80.0–100.0)
Monocytes Absolute: 0.5 10*3/uL (ref 0.1–1.0)
Monocytes Relative: 11 %
Neutro Abs: 3 10*3/uL (ref 1.7–7.7)
Neutrophils Relative %: 71 %
Platelet Count: 221 10*3/uL (ref 150–400)
RBC: 4.01 MIL/uL (ref 3.87–5.11)
RDW: 16 % — ABNORMAL HIGH (ref 11.5–15.5)
WBC Count: 4.1 10*3/uL (ref 4.0–10.5)
nRBC: 0 % (ref 0.0–0.2)

## 2021-12-30 LAB — CORTISOL: Cortisol, Plasma: 9.8 ug/dL

## 2021-12-30 LAB — TSH: TSH: 4.79 u[IU]/mL — ABNORMAL HIGH (ref 0.350–4.500)

## 2021-12-30 MED ORDER — SODIUM CHLORIDE 0.9 % IV SOLN
240.0000 mg | Freq: Once | INTRAVENOUS | Status: AC
  Administered 2021-12-30: 240 mg via INTRAVENOUS
  Filled 2021-12-30: qty 24

## 2021-12-30 MED ORDER — SODIUM CHLORIDE 0.9 % IV SOLN: Freq: Once | INTRAVENOUS | Status: AC

## 2021-12-30 NOTE — Progress Notes (Signed)
Patient presents for treatment. RN assessment completed along with the following: ? ?Labs/vitals reviewed - Yes, and please see collab nurse note.    ?Weight within 10% of previous measurement - Yes ?Informed consent completed and reflects current therapy/intent - Yes, on date 10/26/2020             ?Provider progress note reviewed - Yes, today's provider note was reviewed. ?Treatment/Antibody/Supportive plan reviewed - Yes, and there are no adjustments needed for today's treatment. ?S&H and other orders reviewed - Yes, and there are no additional orders identified. ?Previous treatment date reviewed - Yes, and the appropriate amount of time has elapsed between treatments. ?Clinic Hand Off Received from - Yes from [place], RN ? ?Patient to proceed with treatment.  ? ?

## 2021-12-30 NOTE — Progress Notes (Signed)
?[place] ?OFFICE PROGRESS NOTE ? ? ?Diagnosis: Anal cancer ? ?INTERVAL HISTORY:  ? ?Ashley Moore returns as scheduled.  She completed another treat with nivolumab on 12/15/2021.  No rash.  She reports once daily diarrhea beginning 12/25/2021.  She also has burning with urination and urinary frequency.  This also began 12/25/2021.  She saw her gynecologist yesterday and is being prescribed antibiotic.  She had increased intake of vegetables including broccoli and cauliflower last week.  Leg swelling remains improved.  She has started an exercise program. ? ?Objective: ? ?Vital signs in last 24 hours: ? ?Blood pressure 136/86, pulse 86, temperature 98.2 ?F (36.8 ?C), temperature source Oral, resp. rate 18, height '5\' 1"'$  (1.549 m), weight 158 lb 6.4 oz (71.8 kg), SpO2 100 %. ?  ?Lymphatics: No inguinal nodes, no supraclavicular nodes ?Resp: End inspiratory fine rales at the posterior chest bilaterally, no respiratory distress ?Cardio: Regular rate and rhythm ?GI: No hepatosplenomegaly, nontender ?Vascular: Trace lower leg edema bilaterally, no thigh edema ? ? ?Lab Results: ? ?Lab Results  ?Component Value Date  ? WBC 4.1 12/30/2021  ? HGB 11.0 (L) 12/30/2021  ? HCT 35.1 (L) 12/30/2021  ? MCV 87.5 12/30/2021  ? PLT 221 12/30/2021  ? NEUTROABS 3.0 12/30/2021  ? ? ?CMP  ?Lab Results  ?Component Value Date  ? NA 135 12/15/2021  ? K 4.6 12/15/2021  ? CL 104 12/15/2021  ? CO2 22 12/15/2021  ? GLUCOSE 93 12/15/2021  ? BUN 24 (H) 12/15/2021  ? CREATININE 0.93 12/15/2021  ? CALCIUM 9.4 12/15/2021  ? PROT 7.5 12/15/2021  ? ALBUMIN 4.0 12/15/2021  ? AST 22 12/15/2021  ? ALT 13 12/15/2021  ? ALKPHOS 67 12/15/2021  ? BILITOT 0.3 12/15/2021  ? GFRNONAA >60 12/15/2021  ? GFRAA >60 06/17/2020  ? ? ?Lab Results  ?Component Value Date  ? CEA1 1.15 03/04/2021  ? CEA 1.16 03/04/2021  ? ? ?Medications: I have reviewed the patient's current medications. ? ? ?Assessment/Plan: ?Anal cancer ?CT abdomen/pelvis 10/21/2016-thickening  of the anus extending to the junction between the anus and rectum with a large stool ball in the rectum and mild fat stranding posterior to the rectum. Enlarged lymph nodes in the right and left inguinal regions. A few mildly prominent nodes seen posterior to the rectum. 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 ?PET scan 24/40/1027-[id number] hypermetabolic anal mass with hypermetabolic metastases to the groin region bilaterally, left pelvic sidewall and presacral space. ?Initiation of radiation and cycle 1 5-FU/mitomycin C 11/14/2016 ?Cycle 2 5-FU/mitomycin C 12/12/2016 (5-FU dose reduced due to mucositis, diarrhea, skin breakdown) ?Radiation completed 12/23/2016 ?CT abdomen/pelvis 04/14/2017-resolution of anal mass and bilateral inguinal lymphadenopathy. No residual tumor seen. ?CT abdomen/pelvis 01/28/2020-right hydroureteronephrosis, transition at the level of the mid right ureter, retroperitoneal lymphadenopathy ?CT renal stone study 01/28/2020-severe right hydronephrosis and proximal right hydroureter, 8 mm area of increased attenuation in the proximal right ureter-stone versus soft tissue mass ?PET 02/13/2020-hypermetabolic right retroperitoneal nodal metastases in the aortocaval and posterior pericaval chains, nonspecific mild anal wall hypermetabolism, right nephroureteral stent ?02/21/2020 anorectal exam per Dr. Ashley Jacobs radiation changes of the skin around the anal margin.  Posterior midline smooth scarring.  Mildly stenotic.  Stenosis seems better.  No palpable concerning lesions.  Entire anal canal and distal rectum feels smooth and healthy.  Partial anoscopy performed with no lesions in the anal canal. ?Xeloda/radiation beginning 03/02/2020, completed 04/13/2020 ?CT abdomen/pelvis 06/17/2020-resolution of right retroperitoneal lymphadenopathy, no remaining pathologically enlarged lymph nodes, residual mild  right hydronephrosis, no evidence of progressive disease, stable anal wall  thickening ?Digital rectal exam by Dr. Quentin Cornwall 08/26/2020-posterior midline smooth scarring.  Mildly stenotic.  Stenosis seems better.  No palpable concerning lesions.  Anal canal feels smooth without palpable concern.  Unable to tolerate anoscopy.  Next visit in 6 months for surveillance. ?CT abdomen/pelvis 10/28/2020-no evidence of local recurrence or metastatic disease.  Stable mild rectal wall thickening.  New diffuse bladder wall thickening possibly related to prior radiation ?CT abdomen/pelvis 01/16/2021- recurrent right-sided hydronephrosis and proximal right hydroureter status post removal of right ureter stent, new soft tissue nodule along the course of the right ureter between the right psoas and IVC suspicious for recurrent lymphadenopathy ?PET 02/12/2021-small focus of hypermetabolism at the anorectal junction without a definable mass, right retroperitoneal nodal metastasis with obstruction of the proximal right ureter, right middle lobe hypermetabolic nodule, hypermetabolism in the right hilum without a defined nodal mass, hypermetabolic left supraclavicular and left axillary nodes ?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 ?Cycle 1 Taxol/carboplatin 03/11/2021 ?Cycle 2 Taxol/carboplatin 03/31/2021, Fulphila ?Cycle 3 Taxol/carboplatin 04/21/2021, Fulphila ?CTs 05/11/2021-decrease size of FDG avid nodule in right middle lobe, right middle lobe subpleural nodule slightly enlarged, decreased in size of left axillary and subpectoral nodes, unchanged FDG avid right iliac nodes ?Cycle 4 Taxol/carboplatin 05/12/2021, G-CSF discontinued secondary to poor tolerance, Taxol and carboplatin dose reduced ?Cycle 5 Taxol/carboplatin 06/02/2021 ?Cycle 6 carboplatin 07/01/2021, Taxol held due to neuropathy ?Cycle 7 carboplatin 07/29/2021, Taxol held due to neuropathy ?CTs 08/16/2021-new liver lesion, increase in right psoas lymph node, unchanged right hydronephrosis, new  mediastinal and right hilar lymphadenopathy, enlargement of bilateral pulmonary nodules and at least one new nodule, new subcarinal node ?Cycle 1 nivolumab 08/25/2021 ?Cycle 2 nivolumab 09/08/2021 ?Cycle 3 nivolumab 09/22/2021 ?Cycle 4 nivolumab 10/06/2021 ?Cycle 5 nivolumab 10/20/2021 ?Cycle 6 nivolumab 11/04/2021 ?CTs 11/12/2021-resolution of lung nodules, decreased size of right psoas node, no remaining mediastinal lymphadenopathy, resolution of right liver lesion ?Cycle 7 nivolumab 11/17/2021 ?Cycle 8 nivolumab 12/01/2021 ?Cycle 9 nivolumab 12/15/2021 ?Cycle 10 nivolumab 12/30/2021 ?Left labial lesions. Question direct extension from the anal cancer versus metastatic disease from anal cancer versus a separate malignant process. 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 ?Multiple family members with breast cancer. ?History of hypokalemia-likely secondary to decreased nutritional intake and diarrhea; potassium in normal range 01/16/2017. No longer taking a potassium supplement. ?Right hydroureteronephrosis on CT 01/28/2020, status post a cystoscopy/pyelogram 01/28/2020 confirming extrinsic compression of the right ureter, status post stent placement ?Right ureter stent exchange 06/12/2020 ?Right ureter stent removed 01/13/2021 ?  ?8.  Admission 01/07/2021 with Klebsiella pneumoniae urosepsis/bacteremia  ?9.  Hospital admission 05/15/2021 with UTI/possible right pyelonephritis ?  ? ? ?Disposition: ?Ms. Gras appears unchanged.  She will continue every 2-week nivolumab.  The plan is to change to a monthly schedule if there is no evidence of disease progression on the next CTs. ? ?The diarrhea may be related to dietary changes.  She will call for increased diarrhea.  I have a low suspicion for nivolumab induced colitis.  She will contact her gynecologist today for treatment of urinary tract infection. ?Ms. Housewright will return  for an office visit in nivolumab in 2 weeks. ? ?Ashley Coder, MD ? ?12/30/2021  ?9:56 AM ? ? ?

## 2021-12-30 NOTE — Patient Instructions (Signed)
[place]   ?Discharge Instructions: ?Thank you for choosing [facility] to provide your oncology and hematology care.  ? ?If you have a lab appointment with the [facility], please go directly to the [facility] and check in at the registration area. ?  ?Wear comfortable clothing and clothing appropriate for easy access to any Portacath or PICC line.  ? ?We strive to give you quality time with your provider. You may need to reschedule your appointment if you arrive late (15 or more minutes).  Arriving late affects you and other patients whose appointments are after yours.  Also, if you miss three or more appointments without notifying the office, you may be dismissed from the clinic at the provider?s discretion.    ?  ?For prescription refill requests, have your pharmacy contact our office and allow 72 hours for refills to be completed.   ? ?Today you received the following chemotherapy and/or immunotherapy agents Nivolumab (OPDIVO).    ?  ?To help prevent nausea and vomiting after your treatment, we encourage you to take your nausea medication as directed. ? ?BELOW ARE SYMPTOMS THAT SHOULD BE REPORTED IMMEDIATELY: ?*FEVER GREATER THAN 100.4 F (38 ?C) OR HIGHER ?*CHILLS OR SWEATING ?*NAUSEA AND VOMITING THAT IS NOT CONTROLLED WITH YOUR NAUSEA MEDICATION ?*UNUSUAL SHORTNESS OF BREATH ?*UNUSUAL BRUISING OR BLEEDING ?*URINARY PROBLEMS (pain or burning when urinating, or frequent urination) ?*BOWEL PROBLEMS (unusual diarrhea, constipation, pain near the anus) ?TENDERNESS IN MOUTH AND THROAT WITH OR WITHOUT PRESENCE OF ULCERS (sore throat, sores in mouth, or a toothache) ?UNUSUAL RASH, SWELLING OR PAIN  ?UNUSUAL VAGINAL DISCHARGE OR ITCHING  ? ?Items with * indicate a potential emergency and should be followed up as soon as possible or go to the Emergency Department if any problems should occur. ? ?Please show the CHEMOTHERAPY ALERT CARD or IMMUNOTHERAPY ALERT CARD at  check-in to the Emergency Department and triage nurse. ? ?Should you have questions after your visit or need to cancel or reschedule your appointment, please contact [facility]  Dept: [phone]  and follow the prompts.  Office hours are 8:00 a.m. to 4:30 p.m. Monday - Friday. Please note that voicemails left after 4:00 p.m. may not be returned until the following business day.  We are closed weekends and major holidays. You have access to a nurse at all times for urgent questions. Please call the main number to the clinic Dept: [phone] and follow the prompts. ? ? ?For any non-urgent questions, you may also contact your provider using MyChart. We now offer e-Visits for anyone 39 and older to request care online for non-urgent symptoms. For details visit [email or web address]. ?  ?Also download the MyChart app! Go to the app store, search "MyChart", open the app, select [facility], and log in with your MyChart username and password. ? ?Due to Covid, a mask is required upon entering the hospital/clinic. If you do not have a mask, one will be given to you upon arrival. For doctor visits, patients may have 1 support person aged 2 or older with them. For treatment visits, patients cannot have anyone with them due to current Covid guidelines and our immunocompromised population.  ? ?Nivolumab injection ?What is this medication? ?NIVOLUMAB (nye VOL ue mab) is a monoclonal antibody. It treats certain types of cancer. Some of the cancers treated are colon cancer, head and neck cancer, Hodgkin lymphoma, lung cancer, and melanoma. ?This medicine may be used for other purposes;  ask your health care provider or pharmacist if you have questions. ?COMMON BRAND NAME(S): Opdivo ?What should I tell my care team before I take this medication? ?They need to know if you have any of these conditions: ?Autoimmune diseases such as Crohn's disease, ulcerative colitis, or lupus ?Have had or planning to  have an allogeneic stem cell transplant (uses someone else's stem cells) ?History of chest radiation ?Organ transplant ?Nervous system problems such as myasthenia gravis or Guillain-Barre syndrome ?An unusual or allergic reaction to nivolumab, other medicines, foods, dyes, or preservatives ?Pregnant or trying to get pregnant ?Breast-feeding ?How should I use this medication? ?This medication is injected into a vein. It is given in a hospital or clinic setting. ?A special MedGuide will be given to you before each treatment. Be sure to read this information carefully each time. ?Talk to your care team regarding the use of this medication in children. While it may be prescribed for children as young as 12 years for selected conditions, precautions do apply. ?Overdosage: If you think you have taken too much of this medicine contact a poison control center or emergency room at once. ?NOTE: This medicine is only for you. Do not share this medicine with others. ?What if I miss a dose? ?Keep appointments for follow-up doses. It is important not to miss your dose. Call your care team if you are unable to keep an appointment. ?What may interact with this medication? ?Interactions have not been studied. ?This list may not describe all possible interactions. Give your health care provider a list of all the medicines, herbs, non-prescription drugs, or dietary supplements you use. Also tell them if you smoke, drink alcohol, or use illegal drugs. Some items may interact with your medicine. ?What should I watch for while using this medication? ?Your condition will be monitored carefully while you are receiving this medication. ?You may need blood work done while you are taking this medication. ?Do not become pregnant while taking this medication or for 5 months after stopping it. Women should inform their care team if they wish to become pregnant or think they might be pregnant. There is a potential for serious harm to an unborn  child. Talk to your care team for more information. Do not breast-feed an infant while taking this medication or for 5 months after stopping it. ?What side effects may I notice from receiving this medication? ?Side effects that you should report to your care team as soon as possible: ?Allergic reactions--skin rash, itching, hives, swelling of the face, lips, tongue, or throat ?Bloody or black, tar-like stools ?Change in vision ?Chest pain ?Diarrhea ?Dry cough, shortness of breath or trouble breathing ?Eye pain ?Fast or irregular heartbeat ?Fever, chills ?High blood sugar (hyperglycemia)--increased thirst or amount of urine, unusual weakness or fatigue, blurry vision ?High thyroid levels (hyperthyroidism)--fast or irregular heartbeat, weight loss, excessive sweating or sensitivity to heat, tremors or shaking, anxiety, nervousness, irregular menstrual cycle or spotting ?Kidney injury--decrease in the amount of urine, swelling of the ankles, hands, or feet ?Liver injury--right upper belly pain, loss of appetite, nausea, light-colored stool, dark yellow or brown urine, yellowing skin or eyes, unusual weakness or fatigue ?Low red blood cell count--unusual weakness or fatigue, dizziness, headache, trouble breathing ?Low thyroid levels (hypothyroidism)--unusual weakness or fatigue, increased sensitivity to cold, constipation, hair loss, dry skin, weight gain, feelings of depression ?Mood and behavior changes-confusion, change in sex drive or performance, irritability ?Muscle pain or cramps ?Pain, tingling, or numbness in the hands or feet, muscle  weakness, trouble walking, loss of balance or coordination ?Red or dark brown urine ?Redness, blistering, peeling, or loosening of the skin, including inside the mouth ?Stomach pain ?Unusual bruising or bleeding ?Side effects that usually do not require medical attention (report to your care team if they continue or are bothersome): ?Bone pain ?Constipation ?Loss of  appetite ?Nausea ?Tiredness ?Vomiting ?This list may not describe all possible side effects. Call your doctor for medical advice about side effects. You may report side effects to FDA at 1-800-FDA-1088. ?Where should I k

## 2021-12-30 NOTE — Progress Notes (Signed)
Patient seen by Dr. Sherrill today ? ?Vitals are within treatment parameters. ? ?Labs reviewed by Dr. Sherrill and are within treatment parameters. ? ?Per physician team, patient is ready for treatment and there are NO modifications to the treatment plan.  ?

## 2022-01-04 ENCOUNTER — Ambulatory Visit: Payer: Medicare HMO | Admitting: Physical Therapy

## 2022-01-04 ENCOUNTER — Encounter: Payer: Self-pay | Admitting: Physical Therapy

## 2022-01-04 DIAGNOSIS — M25611 Stiffness of right shoulder, not elsewhere classified: Secondary | ICD-10-CM

## 2022-01-04 DIAGNOSIS — R293 Abnormal posture: Secondary | ICD-10-CM

## 2022-01-04 DIAGNOSIS — G8929 Other chronic pain: Secondary | ICD-10-CM

## 2022-01-04 DIAGNOSIS — M25511 Pain in right shoulder: Secondary | ICD-10-CM

## 2022-01-04 DIAGNOSIS — R600 Localized edema: Secondary | ICD-10-CM | POA: Diagnosis not present

## 2022-01-04 DIAGNOSIS — M6281 Muscle weakness (generalized): Secondary | ICD-10-CM

## 2022-01-04 NOTE — Therapy (Addendum)
OUTPATIENT PHYSICAL THERAPY TREATMENT NOTE       PROGRESS NOTE/ RE-CERTIFICATION  Patient Name: Ashley Moore MRN: [id number] DOB:Jan 16, 1951, 71 y.o., female Today's Date: 01/04/2022  PCP: [name], MD REFERRING PROVIDER: Persons, [name], [place]  Progress Note Reporting Period 10/26/2021 to 01/04/2022   See note below for Objective Data and Assessment of Progress/Goals.       PT End of Session - 01/04/22 1524     Visit Number 10    Number of Visits 12    Date for PT Re-Evaluation 01/11/22    Authorization Type [employer] 12 visits applied for on 10/19/2021 to 12/17/2021, Progress note 01/04/2022 at 10th visit    Progress Note Due on Visit 79    PT Start Time 1522    PT Stop Time 5638    PT Time Calculation (min) 42 min    Activity Tolerance Patient tolerated treatment well    Behavior During Therapy WFL for tasks assessed/performed                    Past Medical History:  Diagnosis Date   Arthritis    Bowen's disease    excised 1992   Chronic low back pain    Chronic radiation cystitis    DDD (degenerative disc disease), cervical    Family history of breast cancer    Family history of lung cancer    Family history of non-Hodgkin's lymphoma    Family history of ovarian cancer    Headache    Hx of varicella    Hydronephrosis of right kidney    urologist--- dr Tresa Moore,  malignant,  treated with ureter stent   Hypothyroidism    followed by pcp   Lower urinary tract symptoms (LUTS)    01-12-2021  per pt treated with accupuncture at dr Tresa Moore office   Lymphedema of both lower extremities    PICC (peripherally inserted central catheter) in place 01/11/2021   placed at [facility] in [place], [place] for IV antibiotics   Positive blood culture 01/08/2021   Klebsiella Pneumaniae,  treated with daily IV antibiotic   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[facility]) oncologist--- dr Benay Spice   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,  invasive SCC , completed chemo/ radiation 12-23-2016;  recurrent  metastatsis retroperitoneal lymphdenopathy,  completed radiation 04-13-2020 residual right ureteral uropathy obstruction   Retroperitoneal lymphadenopathy    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   Sepsis due to Klebsiella pneumoniae ([place]) 01/07/2021   pt admitted to [facility] in [place],  dx sepsis secondary to acute pyelonephritis with bacteremia , positive blood culture,  discharged 01-11-2021 home daily IV antibiotic   Wears glasses    Past Surgical History:  Procedure Laterality Date   BUNIONECTOMY Right yrs ago   [place] Right 06/12/2020   Procedure: CYSTOSCOPY WITH RETROGRADE PYELOGRAM/URETERAL STENT EXCHANGE;  Surgeon: Alexis Frock, MD;  Location: [facility];  Service: Urology;  Laterality: Right;   CYSTOSCOPY W/ URETERAL STENT PLACEMENT Right 01/13/2021   Procedure: CYSTOSCOPY WITH RETROGRADE PYELOGRAM/URETERAL STENT REMOVALRIGHT;  Surgeon: Alexis Frock, MD;  Location: [facility];  Service: Urology;  Laterality: Right;  [place], URETEROSCOPY AND STENT PLACEMENT Right 01/28/2020   Procedure: CYSTOSCOPY WITH RETROGRADE PYELOGRAM, URETEROSCOPY AND STENT PLACEMENT;  Surgeon: Alexis Frock, MD;  Location: [facility];  Service: Urology;  Laterality: Right;   IR GENERIC HISTORICAL  11/14/2016   IR US  GUIDE VASC ACCESS RIGHT 11/14/2016 [facility]   IR GENERIC HISTORICAL  11/14/2016   IR FLUORO GUIDE CV LINE RIGHT 11/14/2016 [facility]   IR GENERIC HISTORICAL  12/12/2016   IR US GUIDE VASC ACCESS RIGHT 12/12/2016 [name], MD [facility]   IR GENERIC HISTORICAL  12/12/2016   IR FLUORO GUIDE CV LINE RIGHT 12/12/2016 [name], MD [facility]   SKIN CANCER EXCISION  1990   bowens disease   Patient Active Problem List   Diagnosis Date Noted   Primary osteoarthritis, 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/28/2021   Atypical chest pain 06/29/2021    Intractable abdominal pain 05/16/2021   Acute pyelonephritis 05/16/2021   Acute cystitis without hematuria 05/16/2021   Hydronephrosis of right kidney 05/16/2021   Obstructive uropathy 05/16/2021   Hypothyroidism 05/16/2021   Hyponatremia 05/16/2021   Obesity (BMI 30-39.9) 05/16/2021   Goals of care, counseling/discussion 03/01/2021   SIRS (systemic inflammatory response syndrome) ([place]) 01/13/2021   Pyelonephritis 01/13/2021   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   Genetic testing 01/27/2020   Family history of breast cancer    Family history of lung cancer    Family history of ovarian cancer    Family history of non-Hodgkin's lymphoma    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   Port catheter in place 11/21/2016   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   Anal cancer ([place]) 11/03/2016   Cystitis 05/06/2015   Degenerative cervical disc 04/08/2015   Trigger point of neck 03/26/2015   Pain in joint, ankle and foot 04/23/2013   Visit for preventive health examination 02/13/2013   Routine gynecological examination 02/13/2013   Family history of breast cancer in first degree relative 02/13/2013   Rash and nonspecific skin eruption 02/13/2013   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   VITAMIN D DEFICIENCY 06/25/2008   NECK PAIN 06/02/2008   FOOT PAIN 06/02/2008   Osteopenia 06/02/2008    REFERRING DIAG: M25.511,G89.29 (ICD-10-CM) - Chronic right shoulder pain   THERAPY DIAG:  Stiffness of right shoulder, not elsewhere classified  Abnormal posture  Chronic right shoulder pain  Muscle weakness (generalized)  Localized edema  PERTINENT HISTORY: Arthritis Bowen's diseaseexcised 1992 Chronic low back pain Chronic radiation cystitis DDD (degenerative disc disease), cervical Family history of breast cancer Family history of lung cancer Family history of non-Hodgkin's lymphoma Family history of ovarian cancer Headache Hx of varicella Hydronephrosis of right  kidney rectal cancer   PRECAUTIONS: Other: infusion therapy for Cancer , fall precuations   SUBJECTIVE:Pt arriving today reporting 3/10 in Rt shoulder with over the counter pain meds.   PAIN:  Are you having pain? Yes NPRS scale: 3/10 Pain location: anterior shoulder and lateral Rt shoulder into elbow Pain orientation: Right  PAIN TYPE: aching Pain description: constant  Aggravating factors: reaching upward, lateral movements Relieving factors: resting and heat    OBJECTIVE:      PATIENT SURVEYS:  FOTO: 51% on 10/19/2021 FOTO: 53% on 01/04/2022  UPPER EXTREMITY AROM/PROM:   Active ROM  In supine Right 10/19/2021 Left 10/19/2021  Shoulder flexion 120 164  Shoulder extension 30 45    Shoulder abduction 98 160  Shoulder adduction      Shoulder internal rotation 75 shoulder abd 45 degrees 75  Shoulder external rotation 15 Shoulder abd 45 degrees 80  Elbow flexion 140   145  Elbow extension 0 0  Wrist flexion      Wrist extension      Wrist ulnar deviation  Wrist radial deviation      Wrist pronation      Wrist supination      (Blank rows = not tested)   Passive ROM In degrees Right 10/19/21 Right 11/09/21 Right  11/16/21 Right 12/06/21 Rt 12/13/21 Rt 01/04/22 supine  Shoulder flexion 130  120 134 145 145 155  Shoulder extension 35        Shoulder abduction 105 104  110 120 120 150  Shoulder adduction          Shoulder internal rotation 80 shoulder abd 45 degrees    80 shoulder  abducted 45 degrees 78 shoulder  abducted 45 degrees 75 degrees (Shoulder abd 45 degrees)  Shoulder external rotation 20 Shoulder abd 45 degrees 30 Shoulder abducted to 45 degrees   30 Shoulder abducted to 45 degrees 30 Shoulder abducted to 45 degrees 28 degrees Shoulder abd 45 degrees)  Elbow flexion          Elbow extension          Wrist flexion          Wrist extension          Wrist ulnar deviation          Wrist radial deviation          Wrist pronation          Wrist  supination              UPPER EXTREMITY MMT:   MMT Right 10/19/2021 Left 10/19/2021 Right 12/06/2021 Rt 12/28/2021  Shoulder flexion 2+/5 4+/5 3-/5 3/5  Shoulder extension 2+/5 4+/5 3-/5 3/5  Shoulder abduction 2+/5 4+/5 3-/5 3/5  Shoulder adduction        Shoulder internal rotation 2+/5 4+/5 3-/5 3/5  Shoulder external rotation 2+/5 4+/5 3-/5 3/5  Middle trapezius        Lower trapezius        Elbow flexion        Elbow extension        Wrist flexion        Wrist extension        Wrist ulnar deviation        Wrist radial deviation        Wrist pronation        Wrist supination        Grip strength (lbs) 31.8 pounds 48 pounds    29.8 pounds  (Blank rows = not tested)                  TODAY'S TREATMENT:   01/04/2022:   TherEx: UBE L2 x 6 minutes (3 minutes each direction)  UE ranger: flexion x 2 minutes           Scaption x 2 minutes          Circles clock wise and counter clock wise each x 20  Shoulder isometrics: 5 x 5 seconds (flexion, ER, IR, Extension)  Sit to stand: no UE support 14 seconds from standard arm chair    Manual:    STM to bilateral cervical paraspinals, bilateral upper trap, bilateral levator scapulae, bilateral shomboids. Scapular mobs performed on Rt UE.   PROM: Rt UE in all planes, Grade 2-3 GH inferior mobs and AP mobs performed     12/28/2021:  TherEx: UBE L2 x 6 minutes (3 minutes each direction)  UE ranger: flexion x 2 minutes           Scaption x 2 minutes  Circles clock wise and counter clock wise each x 20  Seated shoulder flexion with green physioball x10 Seated downward pressure held for isometric contraction on green physioball x 10    Manual:    STM to bilateral cervical paraspinals, bilateral upper trap, bilateral levator scapulae, bilateral shomboids. Scapular mobs performed on Rt UE.   PROM: Rt UE in all planes, Grade 2-3 GH inferior mobs and AP mobs performed    12/21/2021:  TherEx: UBE L2 x 6 minutes (3 minutes  each direction)  UE ranger: flexion x 2 minutes           Scaption x 2 minutes          Circles clock wise and counter clock wise each x 20  Seated shoulder flexion with red physioball x10 Seated downward pressure held for isometric contraction on red physioball x 10    Manual:    STM to bilateral cervical paraspinals, bilateral upper trap, bilateral levator scapulae, bilateral shomboids. Scapular mobs performed on Rt UE.   PROM: Rt UE in all planes, Grade 2-3 GH inferior mobs and AP mobs performed              PATIENT EDUCATION: Education details:added isometrics to pt's HEP  Person educated: Patient Education method: Explanation Education comprehension: verbalized understanding, returned demonstration, and verbal cues required     HOME EXERCISE PROGRAM: Access Code: [email or web address] URL: [email or web address] Date: 10/19/2021 Prepared by: Kearney Hard   Exercises Seated Shoulder Flexion Towel Slide at Table Top - 2-3 x daily - 7 x weekly - 1 sets - 10 reps - 5-10 seocnds hold Seated Shoulder External Rotation PROM on Table - 2-3 x daily - 7 x weekly - 1 sets - 10 reps - 5-10 seconds hold Seated Scapular Retraction - 2-3 x daily - 7 x weekly - 1 sets - 10 reps - 5-10 seconds hold     ASSESSMENT:   CLINICAL IMPRESSION: 01/04/2022: Pt has made slow progress with AROM and PROM in her Rt shoulder. Pt still presenting with limitations in functional mobility when it comes to reaching her head for bathing and dressing as well as her left UE for bathing and hygiene. Pt has met all her STG's set at her initial evaluation and has partially met her pain goal with ADL's which still varies depending on the days activity level. I am requesting an additional 12 visits for 1x/ week for 12 weeks of skilled PT to maximize pt's function and further progress toward goals set.              REHAB POTENTIAL: fair, due to co-morbidities   CLINICAL DECISION MAKING:  Evolving/moderate complexity   EVALUATION COMPLEXITY: Moderate     GOALS: Goals reviewed with patient? Yes   SHORT TERM GOALS:   STG Name Target Date Goal status  1 Pt will be independent with her initial HEP.    11/16/2021 Met 11/09/2021  2 Pt will be able to perform 5 time sit to stand with no UE support </= 15 seconds Baseline:  11/16/2021 MET  01/04/2022    LONG TERM GOALS:    LTG Name Target Date Goal status  1 PT will be independent in her advanced HEP.  Baseline: 01/11/2022 MET  01/04/22  2 Pt will improve FOTO score to >/= 63 % Baseline: 53% on 01/04/2022 04/08/22 Ongoing 01/04/22  3 Pt will improve her right shoulder ER to >/= 30 degrees to improve functional mobility.  Baseline: 04/08/22 Ongoing 01/04/22  4  Pt will be able to report pain of </= 2/10 with ADL's using her right UE.  Baseline: pain depends on the day 01/04/22 04/08/22 Partially Met  01/04/22       5.   Pt will be able to reach her left under arm for donning deodorant.  04/08/22  New Goal  01/04/22                      PLAN: PT FREQUENCY: 1x/ week for 12 weeks: Recert sent on 8/83/0141   PT DURATION: 12 weeks   PLANNED INTERVENTIONS: Therapeutic exercises, Therapeutic activity, Neuro Muscular re-education, Balance training, Gait training, Patient/Family education, Joint mobilization, Dry Needling, Cryotherapy, Moist heat, Taping, Vasopneumatic device, and Manual therapy   PLAN FOR NEXT SESSION:  shoulder isometrics, shoulder ROM, mobs, PROM, gentle stretching and strengthening as tolerated, UBE as tolerated      Oretha Caprice, PT, MPT 01/04/2022, 3:31 PM   Referring diagnosis? M25.511,G89.29 (ICD-10-CM) - Chronic right shoulder pain  Treatment diagnosis? (if different than referring diagnosis) M25.611, M25.511, R29.3, M62.81, R60.0 What was this (referring dx) caused by? _0  Surgery _1  Fall _2  Ongoing issue _3  Arthritis _4  Other: ____________  Laterality: _5  Rt _6  Lt _7  Both  Check all  possible CPT codes:  *CHOOSE 10 OR LESS*    _8  97110 (Therapeutic Exercise)  _9  92507 (SLP Treatment)  _10  59733 (Neuro Re-ed)   _11  92526 (Swallowing Treatment)   _12  12508 (Gait Training)   _13  71994 (Cognitive Training, 1st 15 minutes) _14  97140 (Manual Therapy)   _15  97130 (Cognitive Training, each add'l 15 minutes)  _16  97164 (Re-evaluation)                              _17  Other, List CPT Code ____________  _18  12904 (Therapeutic Activities)     _19  75339 (Self Care)   _20  All codes above (97110 - 97535)  _21  97012 (Mechanical Traction)  _22  97014 (E-stim Unattended)  _23  97032 (E-stim manual)  _24  97033 (Ionto)  _25  97035 (Ultrasound)  _26  97760 (Orthotic Fit) _27  97750 (Physical Performance Training) _28  [id number] (Aquatic Therapy) _29  17921 (Contrast Bath) _30  78375 (Paraffin) _31  97597 (Wound Care 1st 20 sq cm) _32  97598 (Wound Care each add'l 20 sq cm) _33  42370 (Vasopneumatic Device) _34  23017 (Orthotic Training) _35  [id number] (Prosthetic Training)

## 2022-01-08 ENCOUNTER — Other Ambulatory Visit: Payer: Self-pay | Admitting: Oncology

## 2022-01-10 ENCOUNTER — Ambulatory Visit: Payer: Medicare HMO | Admitting: Physical Therapy

## 2022-01-10 ENCOUNTER — Encounter: Payer: Self-pay | Admitting: Physical Therapy

## 2022-01-10 DIAGNOSIS — M25611 Stiffness of right shoulder, not elsewhere classified: Secondary | ICD-10-CM

## 2022-01-10 DIAGNOSIS — M6281 Muscle weakness (generalized): Secondary | ICD-10-CM | POA: Diagnosis not present

## 2022-01-10 DIAGNOSIS — R600 Localized edema: Secondary | ICD-10-CM

## 2022-01-10 DIAGNOSIS — G8929 Other chronic pain: Secondary | ICD-10-CM

## 2022-01-10 DIAGNOSIS — R293 Abnormal posture: Secondary | ICD-10-CM | POA: Diagnosis not present

## 2022-01-10 DIAGNOSIS — M25511 Pain in right shoulder: Secondary | ICD-10-CM

## 2022-01-10 NOTE — Therapy (Addendum)
OUTPATIENT PHYSICAL THERAPY TREATMENT NOTE          Patient Name: Ashley Moore MRN: [id number] DOB:06-Jul-1951, 71 y.o., female Today's Date: 01/10/2022  PCP: [name], MD REFERRING PROVIDER: Persons, [name], [place]    PT End of Session - 01/10/22 1623     Visit Number 11    Number of Visits 22    Date for PT Re-Evaluation 04/08/22    Authorization Type [employer] 12 visits applied for on 10/19/2021 to 12/17/2021, Progress note Recert sent  2/94/7654 at 10th visit for 1x/ week for 12 weeks beginning 01/17/2022.    Authorization Time Period Pt approved for 12 visits from 01/04/2022 to 02/18/2022    Progress Note Due on Visit 61    PT Start Time 1604    PT Stop Time 1645    PT Time Calculation (min) 41 min    Activity Tolerance Patient tolerated treatment well    Behavior During Therapy WFL for tasks assessed/performed                        Past Medical History:  Diagnosis Date   Arthritis    Bowen's disease    excised 1992   Chronic low back pain    Chronic radiation cystitis    DDD (degenerative disc disease), cervical    Family history of breast cancer    Family history of lung cancer    Family history of non-Hodgkin's lymphoma    Family history of ovarian cancer    Headache    Hx of varicella    Hydronephrosis of right kidney    urologist--- dr Tresa Moore,  malignant,  treated with ureter stent   Hypothyroidism    followed by pcp   Lower urinary tract symptoms (LUTS)    01-12-2021  per pt treated with accupuncture at dr Tresa Moore office   Lymphedema of both lower extremities    PICC (peripherally inserted central catheter) in place 01/11/2021   placed at [facility] in [place], [place] for IV antibiotics   Positive blood culture 01/08/2021   Klebsiella Pneumaniae,  treated with daily IV antibiotic   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[facility]) oncologist--- dr Benay Spice   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,  invasive SCC , completed chemo/ radiation 12-23-2016;  recurrent metastatsis  retroperitoneal lymphdenopathy,  completed radiation 04-13-2020 residual right ureteral uropathy obstruction   Retroperitoneal lymphadenopathy    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   Sepsis due to Klebsiella pneumoniae ([place]) 01/07/2021   pt admitted to [facility] in [place],  dx sepsis secondary to acute pyelonephritis with bacteremia , positive blood culture,  discharged 01-11-2021 home daily IV antibiotic   Wears glasses    Past Surgical History:  Procedure Laterality Date   BUNIONECTOMY Right yrs ago   [place] Right 06/12/2020   Procedure: CYSTOSCOPY WITH RETROGRADE PYELOGRAM/URETERAL STENT EXCHANGE;  Surgeon: Alexis Frock, MD;  Location: [facility];  Service: Urology;  Laterality: Right;   CYSTOSCOPY W/ URETERAL STENT PLACEMENT Right 01/13/2021   Procedure: CYSTOSCOPY WITH RETROGRADE PYELOGRAM/URETERAL STENT REMOVALRIGHT;  Surgeon: Alexis Frock, MD;  Location: [facility];  Service: Urology;  Laterality: Right;  [place], URETEROSCOPY AND STENT PLACEMENT Right 01/28/2020   Procedure: CYSTOSCOPY WITH RETROGRADE PYELOGRAM, URETEROSCOPY AND STENT PLACEMENT;  Surgeon: Alexis Frock, MD;  Location: [facility];  Service: Urology;  Laterality: Right;   IR GENERIC HISTORICAL  11/14/2016   IR US GUIDE VASC ACCESS RIGHT 11/14/2016 [facility]   IR GENERIC HISTORICAL  11/14/2016   IR FLUORO GUIDE CV LINE RIGHT 11/14/2016 [facility]   IR GENERIC HISTORICAL  12/12/2016   IR US GUIDE VASC ACCESS RIGHT 12/12/2016 [name], MD [facility]   IR GENERIC HISTORICAL  12/12/2016   IR FLUORO GUIDE CV LINE RIGHT 12/12/2016 [name], MD [facility]   SKIN CANCER EXCISION  1990   bowens disease   Patient Active Problem List   Diagnosis Date Noted   Primary osteoarthritis, 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/28/2021   Atypical chest pain 06/29/2021   Intractable  abdominal pain 05/16/2021   Acute pyelonephritis 05/16/2021   Acute cystitis without hematuria 05/16/2021   Hydronephrosis of right kidney 05/16/2021   Obstructive uropathy 05/16/2021   Hypothyroidism 05/16/2021   Hyponatremia 05/16/2021   Obesity (BMI 30-39.9) 05/16/2021   Goals of care, counseling/discussion 03/01/2021   SIRS (systemic inflammatory response syndrome) ([place]) 01/13/2021   Pyelonephritis 01/13/2021   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   Genetic testing 01/27/2020   Family history of breast cancer    Family history of lung cancer    Family history of ovarian cancer    Family history of non-Hodgkin's lymphoma    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   Port catheter in place 11/21/2016   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   Anal cancer ([place]) 11/03/2016   Cystitis 05/06/2015   Degenerative cervical disc 04/08/2015   Trigger point of neck 03/26/2015   Pain in joint, ankle and foot 04/23/2013   Visit for preventive health examination 02/13/2013   Routine gynecological examination 02/13/2013   Family history of breast cancer in first degree relative 02/13/2013   Rash and nonspecific skin eruption 02/13/2013   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   VITAMIN D DEFICIENCY 06/25/2008   NECK PAIN 06/02/2008   FOOT PAIN 06/02/2008   Osteopenia 06/02/2008    REFERRING DIAG: M25.511,G89.29 (ICD-10-CM) - Chronic right shoulder pain   THERAPY DIAG:  Stiffness of right shoulder, not elsewhere classified  Chronic right shoulder pain  Localized edema  Abnormal posture  Muscle weakness (generalized)  PERTINENT HISTORY: Arthritis Bowen's diseaseexcised 1992 Chronic low back pain Chronic radiation cystitis DDD (degenerative disc disease), cervical Family history of breast cancer Family history of lung cancer Family history of non-Hodgkin's lymphoma Family history of ovarian cancer Headache Hx of varicella Hydronephrosis of right kidney rectal  cancer   PRECAUTIONS: Other: infusion therapy for Cancer , fall precuations   SUBJECTIVE:Pt arriving today reporting 3-4/10 in Rt shoulder.   PAIN:  Are you having pain? Yes NPRS scale: 3-4/10 Pain location: anterior shoulder and lateral Rt shoulder into elbow Pain orientation: Right  PAIN TYPE: aching Pain description: constant  Aggravating factors: reaching upward, lateral movements Relieving factors: resting and heat    OBJECTIVE:      PATIENT SURVEYS:  FOTO: 51% on 10/19/2021 FOTO: 53% on 01/04/2022  UPPER EXTREMITY AROM/PROM:   Active ROM  In supine Right 10/19/2021 Left 10/19/2021  Shoulder flexion 120 164  Shoulder extension 30 45    Shoulder abduction 98 160  Shoulder adduction      Shoulder internal rotation 75 shoulder abd 45 degrees 75  Shoulder external rotation 15 Shoulder abd 45 degrees 80  Elbow flexion 140   145  Elbow extension 0 0  Wrist flexion      Wrist extension      Wrist ulnar deviation      Wrist  radial deviation      Wrist pronation      Wrist supination      (Blank rows = not tested)   Passive ROM In degrees Right 10/19/21 Right 11/09/21 Right  11/16/21 Right 12/06/21 Rt 12/13/21 Rt 01/04/22 supine  Shoulder flexion 130  120 134 145 145 155  Shoulder extension 35        Shoulder abduction 105 104  110 120 120 150  Shoulder adduction          Shoulder internal rotation 80 shoulder abd 45 degrees    80 shoulder  abducted 45 degrees 78 shoulder  abducted 45 degrees 75 degrees (Shoulder abd 45 degrees)  Shoulder external rotation 20 Shoulder abd 45 degrees 30 Shoulder abducted to 45 degrees   30 Shoulder abducted to 45 degrees 30 Shoulder abducted to 45 degrees 28 degrees Shoulder abd 45 degrees)  Elbow flexion          Elbow extension          Wrist flexion          Wrist extension          Wrist ulnar deviation          Wrist radial deviation          Wrist pronation          Wrist supination              UPPER  EXTREMITY MMT:   MMT Right 10/19/2021 Left 10/19/2021 Right 12/06/2021 Rt 12/28/2021  Shoulder flexion 2+/5 4+/5 3-/5 3/5  Shoulder extension 2+/5 4+/5 3-/5 3/5  Shoulder abduction 2+/5 4+/5 3-/5 3/5  Shoulder adduction        Shoulder internal rotation 2+/5 4+/5 3-/5 3/5  Shoulder external rotation 2+/5 4+/5 3-/5 3/5  Middle trapezius        Lower trapezius        Elbow flexion        Elbow extension        Wrist flexion        Wrist extension        Wrist ulnar deviation        Wrist radial deviation        Wrist pronation        Wrist supination        Grip strength (lbs) 31.8 pounds 48 pounds    29.8 pounds  (Blank rows = not tested)                  TODAY'S TREATMENT:   01/10/2022:  TherEx:  UBE: Level 2.5 x 6 miutes (3 each direction) Ball rolling up wall x 10 with bilateral UE's UE ranger scaption x 1 minute in sitting Pulleys scaption x 2 minutes Pulleys in abduction x 2 minutes Manual: IASTM to Rt shoulder and Rt upper trap/levator and rhomboids Modalities:  Moist heat: 6 minutes to Rt shoulder     01/04/2022:   TherEx: UBE L2 x 6 minutes (3 minutes each direction)  UE ranger: flexion x 2 minutes           Scaption x 2 minutes          Circles clock wise and counter clock wise each x 20  Shoulder isometrics: 5 x 5 seconds (flexion, ER, IR, Extension)  Sit to stand: no UE support 14 seconds from standard arm chair    Manual:    STM to bilateral cervical paraspinals, bilateral upper trap, bilateral levator scapulae,  bilateral shomboids. Scapular mobs performed on Rt UE.   PROM: Rt UE in all planes, Grade 2-3 GH inferior mobs and AP mobs performed     12/28/2021:  TherEx: UBE L2 x 6 minutes (3 minutes each direction)  UE ranger: flexion x 2 minutes           Scaption x 2 minutes          Circles clock wise and counter clock wise each x 20  Seated shoulder flexion with green physioball x10 Seated downward pressure held for isometric contraction on  green physioball x 10    Manual:    STM to bilateral cervical paraspinals, bilateral upper trap, bilateral levator scapulae, bilateral shomboids. Scapular mobs performed on Rt UE.   PROM: Rt UE in all planes, Grade 2-3 GH inferior mobs and AP mobs performed    12/21/2021:  TherEx: UBE L2 x 6 minutes (3 minutes each direction)  UE ranger: flexion x 2 minutes           Scaption x 2 minutes          Circles clock wise and counter clock wise each x 20  Seated shoulder flexion with red physioball x10 Seated downward pressure held for isometric contraction on red physioball x 10    Manual:    STM to bilateral cervical paraspinals, bilateral upper trap, bilateral levator scapulae, bilateral shomboids. Scapular mobs performed on Rt UE.   PROM: Rt UE in all planes, Grade 2-3 GH inferior mobs and AP mobs performed              PATIENT EDUCATION: Education details:added isometrics to pt's HEP  Person educated: Patient Education method: Explanation Education comprehension: verbalized understanding, returned demonstration, and verbal cues required     HOME EXERCISE PROGRAM: Access Code: [email or web address] URL: [email or web address] Date: 10/19/2021 Prepared by: Kearney Hard   Exercises Seated Shoulder Flexion Towel Slide at Table Top - 2-3 x daily - 7 x weekly - 1 sets - 10 reps - 5-10 seocnds hold Seated Shoulder External Rotation PROM on Table - 2-3 x daily - 7 x weekly - 1 sets - 10 reps - 5-10 seconds hold Seated Scapular Retraction - 2-3 x daily - 7 x weekly - 1 sets - 10 reps - 5-10 seconds hold     ASSESSMENT:   CLINICAL IMPRESSION: 01/10/2022:  Pt tolerating exercises with increased pain with abduction and end range flexion. Following IASTM pt with better tolerance to ROM. Continue with skilled PT.  Pt's insurance approved 12 additional visits from 01/04/2022 to 02/18/2022.      01/04/2022: Pt has made slow progress with AROM and PROM in her Rt shoulder. Pt  still presenting with limitations in functional mobility when it comes to reaching her head for bathing and dressing as well as her left UE for bathing and hygiene. Pt has met all her STG's set at her initial evaluation and has partially met her pain goal with ADL's which still varies depending on the days activity level. I am requesting an additional 12 visits for 1x/ week for 12 weeks of skilled PT to maximize pt's function and further progress toward goals set.              REHAB POTENTIAL: fair, due to co-morbidities   CLINICAL DECISION MAKING: Evolving/moderate complexity   EVALUATION COMPLEXITY: Moderate     GOALS: Goals reviewed with patient? Yes   SHORT TERM GOALS:   STG Name Target Date Goal status  1  Pt will be independent with her initial HEP.    11/16/2021 Met 11/09/2021  2 Pt will be able to perform 5 time sit to stand with no UE support </= 15 seconds Baseline:  11/16/2021 MET  01/04/2022    LONG TERM GOALS:    LTG Name Target Date Goal status  1 PT will be independent in her advanced HEP.  Baseline: 01/11/2022 MET  01/04/22  2 Pt will improve FOTO score to >/= 63 % Baseline: 53% on 01/04/2022 04/08/22 Ongoing 01/04/22  3 Pt will improve her right shoulder ER to >/= 30 degrees to improve functional mobility.  Baseline: 04/08/22 Ongoing 01/04/22  4 Pt will be able to report pain of </= 2/10 with ADL's using her right UE.  Baseline: pain depends on the day 01/04/22 04/08/22 Partially Met  01/04/22       5.   Pt will be able to reach her left under arm for donning deodorant.  04/08/22  New Goal  01/04/22                      PLAN: PT FREQUENCY: 1x/ week for 12 weeks: Recert sent on 7/42/5956 Approved for 12 visits from 01/04/2022 -  02/18/2022   PT DURATION: 12 weeks   PLANNED INTERVENTIONS: Therapeutic exercises, Therapeutic activity, Neuro Muscular re-education, Balance training, Gait training, Patient/Family education, Joint mobilization, Dry Needling,  Cryotherapy, Moist heat, Taping, Vasopneumatic device, and Manual therapy   PLAN FOR NEXT SESSION:  shoulder isometrics, shoulder ROM, mobs, PROM, gentle stretching and strengthening as tolerated, UBE as tolerated      Oretha Caprice, PT, MPT 01/10/2022, 4:22 PM

## 2022-01-11 ENCOUNTER — Other Ambulatory Visit: Payer: Self-pay | Admitting: Internal Medicine

## 2022-01-12 ENCOUNTER — Encounter: Payer: Self-pay | Admitting: *Deleted

## 2022-01-12 ENCOUNTER — Inpatient Hospital Stay: Payer: Medicare HMO

## 2022-01-12 ENCOUNTER — Inpatient Hospital Stay: Payer: Medicare HMO | Admitting: Nurse Practitioner

## 2022-01-12 ENCOUNTER — Other Ambulatory Visit: Payer: Self-pay | Admitting: Nurse Practitioner

## 2022-01-12 ENCOUNTER — Encounter: Payer: Self-pay | Admitting: Nurse Practitioner

## 2022-01-12 VITALS — BP 126/77 | HR 84 | Temp 98.1°F | Resp 18 | Ht 61.0 in | Wt 159.0 lb

## 2022-01-12 DIAGNOSIS — C210 Malignant neoplasm of anus, unspecified: Secondary | ICD-10-CM | POA: Diagnosis not present

## 2022-01-12 DIAGNOSIS — C772 Secondary and unspecified malignant neoplasm of intra-abdominal lymph nodes: Secondary | ICD-10-CM

## 2022-01-12 DIAGNOSIS — Z79899 Other long term (current) drug therapy: Secondary | ICD-10-CM | POA: Diagnosis not present

## 2022-01-12 DIAGNOSIS — Z5112 Encounter for antineoplastic immunotherapy: Secondary | ICD-10-CM | POA: Diagnosis not present

## 2022-01-12 LAB — CMP (CANCER CENTER ONLY)
ALT: 11 U/L (ref 0–44)
AST: 22 U/L (ref 15–41)
Albumin: 4.1 g/dL (ref 3.5–5.0)
Alkaline Phosphatase: 69 U/L (ref 38–126)
Anion gap: 10 (ref 5–15)
BUN: 30 mg/dL — ABNORMAL HIGH (ref 8–23)
CO2: 22 mmol/L (ref 22–32)
Calcium: 9.7 mg/dL (ref 8.9–10.3)
Chloride: 103 mmol/L (ref 98–111)
Creatinine: 0.9 mg/dL (ref 0.44–1.00)
GFR, Estimated: >60 mL/min (ref >60)
Glucose, Bld: 102 mg/dL — ABNORMAL HIGH (ref 70–99)
Potassium: 4.6 mmol/L (ref 3.5–5.1)
Sodium: 135 mmol/L (ref 135–145)
Total Bilirubin: 0.3 mg/dL (ref 0.3–1.2)
Total Protein: 7.7 g/dL (ref 6.5–8.1)

## 2022-01-12 MED ORDER — LORAZEPAM 0.5 MG PO TABS: 0.5000 mg | ORAL_TABLET | Freq: Three times a day (TID) | ORAL | 0 refills | Status: DC | PRN

## 2022-01-12 MED ORDER — SODIUM CHLORIDE 0.9 % IV SOLN
240.0000 mg | Freq: Once | INTRAVENOUS | Status: AC
  Administered 2022-01-12: 240 mg via INTRAVENOUS
  Filled 2022-01-12: qty 24

## 2022-01-12 MED ORDER — SODIUM CHLORIDE 0.9 % IV SOLN: Freq: Once | INTRAVENOUS | Status: AC

## 2022-01-12 NOTE — Patient Instructions (Signed)
[place]   ?Discharge Instructions: ?Thank you for choosing [facility] to provide your oncology and hematology care.  ? ?If you have a lab appointment with the [facility], please go directly to the [facility] and check in at the registration area. ?  ?Wear comfortable clothing and clothing appropriate for easy access to any Portacath or PICC line.  ? ?We strive to give you quality time with your provider. You may need to reschedule your appointment if you arrive late (15 or more minutes).  Arriving late affects you and other patients whose appointments are after yours.  Also, if you miss three or more appointments without notifying the office, you may be dismissed from the clinic at the provider?s discretion.    ?  ?For prescription refill requests, have your pharmacy contact our office and allow 72 hours for refills to be completed.   ? ?Today you received the following chemotherapy and/or immunotherapy agents Nivolumab (OPDIVO).    ?  ?To help prevent nausea and vomiting after your treatment, we encourage you to take your nausea medication as directed. ? ?BELOW ARE SYMPTOMS THAT SHOULD BE REPORTED IMMEDIATELY: ?*FEVER GREATER THAN 100.4 F (38 ?C) OR HIGHER ?*CHILLS OR SWEATING ?*NAUSEA AND VOMITING THAT IS NOT CONTROLLED WITH YOUR NAUSEA MEDICATION ?*UNUSUAL SHORTNESS OF BREATH ?*UNUSUAL BRUISING OR BLEEDING ?*URINARY PROBLEMS (pain or burning when urinating, or frequent urination) ?*BOWEL PROBLEMS (unusual diarrhea, constipation, pain near the anus) ?TENDERNESS IN MOUTH AND THROAT WITH OR WITHOUT PRESENCE OF ULCERS (sore throat, sores in mouth, or a toothache) ?UNUSUAL RASH, SWELLING OR PAIN  ?UNUSUAL VAGINAL DISCHARGE OR ITCHING  ? ?Items with * indicate a potential emergency and should be followed up as soon as possible or go to the Emergency Department if any problems should occur. ? ?Please show the CHEMOTHERAPY ALERT CARD or IMMUNOTHERAPY ALERT CARD at  check-in to the Emergency Department and triage nurse. ? ?Should you have questions after your visit or need to cancel or reschedule your appointment, please contact [facility]  Dept: [phone]  and follow the prompts.  Office hours are 8:00 a.m. to 4:30 p.m. Monday - Friday. Please note that voicemails left after 4:00 p.m. may not be returned until the following business day.  We are closed weekends and major holidays. You have access to a nurse at all times for urgent questions. Please call the main number to the clinic Dept: [phone] and follow the prompts. ? ? ?For any non-urgent questions, you may also contact your provider using MyChart. We now offer e-Visits for anyone 41 and older to request care online for non-urgent symptoms. For details visit [email or web address]. ?  ?Also download the MyChart app! Go to the app store, search "MyChart", open the app, select [facility], and log in with your MyChart username and password. ? ?Due to Covid, a mask is required upon entering the hospital/clinic. If you do not have a mask, one will be given to you upon arrival. For doctor visits, patients may have 1 support person aged 73 or older with them. For treatment visits, patients cannot have anyone with them due to current Covid guidelines and our immunocompromised population.  ? ?Nivolumab injection ?What is this medication? ?NIVOLUMAB (nye VOL ue mab) is a monoclonal antibody. It treats certain types of cancer. Some of the cancers treated are colon cancer, head and neck cancer, Hodgkin lymphoma, lung cancer, and melanoma. ?This medicine may be used for other purposes;  ask your health care provider or pharmacist if you have questions. ?COMMON BRAND NAME(S): Opdivo ?What should I tell my care team before I take this medication? ?They need to know if you have any of these conditions: ?Autoimmune diseases such as Crohn's disease, ulcerative colitis, or lupus ?Have had or planning to  have an allogeneic stem cell transplant (uses someone else's stem cells) ?History of chest radiation ?Organ transplant ?Nervous system problems such as myasthenia gravis or Guillain-Barre syndrome ?An unusual or allergic reaction to nivolumab, other medicines, foods, dyes, or preservatives ?Pregnant or trying to get pregnant ?Breast-feeding ?How should I use this medication? ?This medication is injected into a vein. It is given in a hospital or clinic setting. ?A special MedGuide will be given to you before each treatment. Be sure to read this information carefully each time. ?Talk to your care team regarding the use of this medication in children. While it may be prescribed for children as young as 12 years for selected conditions, precautions do apply. ?Overdosage: If you think you have taken too much of this medicine contact a poison control center or emergency room at once. ?NOTE: This medicine is only for you. Do not share this medicine with others. ?What if I miss a dose? ?Keep appointments for follow-up doses. It is important not to miss your dose. Call your care team if you are unable to keep an appointment. ?What may interact with this medication? ?Interactions have not been studied. ?This list may not describe all possible interactions. Give your health care provider a list of all the medicines, herbs, non-prescription drugs, or dietary supplements you use. Also tell them if you smoke, drink alcohol, or use illegal drugs. Some items may interact with your medicine. ?What should I watch for while using this medication? ?Your condition will be monitored carefully while you are receiving this medication. ?You may need blood work done while you are taking this medication. ?Do not become pregnant while taking this medication or for 5 months after stopping it. Women should inform their care team if they wish to become pregnant or think they might be pregnant. There is a potential for serious harm to an unborn  child. Talk to your care team for more information. Do not breast-feed an infant while taking this medication or for 5 months after stopping it. ?What side effects may I notice from receiving this medication? ?Side effects that you should report to your care team as soon as possible: ?Allergic reactions--skin rash, itching, hives, swelling of the face, lips, tongue, or throat ?Bloody or black, tar-like stools ?Change in vision ?Chest pain ?Diarrhea ?Dry cough, shortness of breath or trouble breathing ?Eye pain ?Fast or irregular heartbeat ?Fever, chills ?High blood sugar (hyperglycemia)--increased thirst or amount of urine, unusual weakness or fatigue, blurry vision ?High thyroid levels (hyperthyroidism)--fast or irregular heartbeat, weight loss, excessive sweating or sensitivity to heat, tremors or shaking, anxiety, nervousness, irregular menstrual cycle or spotting ?Kidney injury--decrease in the amount of urine, swelling of the ankles, hands, or feet ?Liver injury--right upper belly pain, loss of appetite, nausea, light-colored stool, dark yellow or brown urine, yellowing skin or eyes, unusual weakness or fatigue ?Low red blood cell count--unusual weakness or fatigue, dizziness, headache, trouble breathing ?Low thyroid levels (hypothyroidism)--unusual weakness or fatigue, increased sensitivity to cold, constipation, hair loss, dry skin, weight gain, feelings of depression ?Mood and behavior changes-confusion, change in sex drive or performance, irritability ?Muscle pain or cramps ?Pain, tingling, or numbness in the hands or feet, muscle  weakness, trouble walking, loss of balance or coordination ?Red or dark brown urine ?Redness, blistering, peeling, or loosening of the skin, including inside the mouth ?Stomach pain ?Unusual bruising or bleeding ?Side effects that usually do not require medical attention (report to your care team if they continue or are bothersome): ?Bone pain ?Constipation ?Loss of  appetite ?Nausea ?Tiredness ?Vomiting ?This list may not describe all possible side effects. Call your doctor for medical advice about side effects. You may report side effects to FDA at 1-800-FDA-1088. ?Where should I k

## 2022-01-12 NOTE — Progress Notes (Addendum)
Patient seen by Ned Card NP today ? ?Vitals are within treatment parameters. ? ?Labs reviewed by Ned Card NP and are within treatment parameters. ?CBC not needed per Ned Card, NP. ?Per physician team, patient is ready for treatment and there are NO modifications to the treatment plan.  ?

## 2022-01-12 NOTE — Progress Notes (Signed)
?[facility] ?OFFICE PROGRESS NOTE ? ? ?Diagnosis: Anal cancer ? ?INTERVAL HISTORY:  ? ?[name] returns as scheduled.  She completed another treatment with nivolumab 12/30/2021.  No rash or diarrhea.  Leg swelling continues to be improved.  She reports developing a "bad UTI" since her last visit with hematuria and dysuria.  She has completed a course of antibiotics with improvement. ? ?Objective: ? ?Vital signs in last 24 hours: ? ?Blood pressure 126/77, pulse 84, temperature 98.1 ?F (36.7 ?C), temperature source Oral, resp. rate 18, height '5\' 1"'$  (1.549 m), weight 159 lb (72.1 kg), SpO2 100 %. ?  ? ?HEENT: No thrush or ulcers. ?Resp: Lungs clear bilaterally. ?Cardio: Regular rate and rhythm. ?GI: Abdomen soft and nontender.  No hepatosplenomegaly. ?Vascular: Trace lower leg edema bilaterally. ?Skin: No rash. ? ? ?Lab Results: ? ?Lab Results  ?Component Value Date  ? WBC 4.1 12/30/2021  ? HGB 11.0 (L) 12/30/2021  ? HCT 35.1 (L) 12/30/2021  ? MCV 87.5 12/30/2021  ? PLT 221 12/30/2021  ? NEUTROABS 3.0 12/30/2021  ? ? ?Imaging: ? ?No results found. ? ?Medications: I have reviewed the patient's current medications. ? ?Assessment/Plan: ?Anal cancer ?CT abdomen/pelvis 10/21/2016-thickening of the anus extending to the junction between the anus and rectum with a large stool ball in the rectum and mild fat stranding posterior to the rectum. Enlarged lymph nodes in the right and left inguinal regions. A few mildly prominent nodes seen posterior to the rectum. 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 ?PET scan 05/02/4817-[facility] hypermetabolic anal mass with hypermetabolic metastases to the groin region bilaterally, left pelvic sidewall and presacral space. ?Initiation of radiation and cycle 1 5-FU/mitomycin C 11/14/2016 ?Cycle 2 5-FU/mitomycin C 12/12/2016 (5-FU dose reduced due to mucositis, diarrhea, skin breakdown) ?Radiation completed 12/23/2016 ?CT abdomen/pelvis  04/14/2017-resolution of anal mass and bilateral inguinal lymphadenopathy. No residual tumor seen. ?CT abdomen/pelvis 01/28/2020-right hydroureteronephrosis, transition at the level of the mid right ureter, retroperitoneal lymphadenopathy ?CT renal stone study 01/28/2020-severe right hydronephrosis and proximal right hydroureter, 8 mm area of increased attenuation in the proximal right ureter-stone versus soft tissue mass ?PET 02/13/2020-hypermetabolic right retroperitoneal nodal metastases in the aortocaval and posterior pericaval chains, nonspecific mild anal wall hypermetabolism, right nephroureteral stent ?02/21/2020 anorectal exam per Dr. Ashley Jacobs radiation changes of the skin around the anal margin.  Posterior midline smooth scarring.  Mildly stenotic.  Stenosis seems better.  No palpable concerning lesions.  Entire anal canal and distal rectum feels smooth and healthy.  Partial anoscopy performed with no lesions in the anal canal. ?Xeloda/radiation beginning 03/02/2020, completed 04/13/2020 ?CT abdomen/pelvis 06/17/2020-resolution of right retroperitoneal lymphadenopathy, no remaining pathologically enlarged lymph nodes, residual mild right hydronephrosis, no evidence of progressive disease, stable anal wall thickening ?Digital rectal exam by Dr. Quentin Cornwall 08/26/2020-posterior midline smooth scarring.  Mildly stenotic.  Stenosis seems better.  No palpable concerning lesions.  Anal canal feels smooth without palpable concern.  Unable to tolerate anoscopy.  Next visit in 6 months for surveillance. ?CT abdomen/pelvis 10/28/2020-no evidence of local recurrence or metastatic disease.  Stable mild rectal wall thickening.  New diffuse bladder wall thickening possibly related to prior radiation ?CT abdomen/pelvis 01/16/2021- recurrent right-sided hydronephrosis and proximal right hydroureter status post removal of right ureter stent, new soft tissue nodule along the course of the right ureter between the right psoas and  IVC suspicious for recurrent lymphadenopathy ?PET 02/12/2021-small focus of hypermetabolism at the anorectal junction without a definable mass, right retroperitoneal nodal metastasis with obstruction of  the proximal right ureter, right middle lobe hypermetabolic nodule, hypermetabolism in the right hilum without a defined nodal mass, hypermetabolic left supraclavicular and left axillary nodes ?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 ?Cycle 1 Taxol/carboplatin 03/11/2021 ?Cycle 2 Taxol/carboplatin 03/31/2021, Fulphila ?Cycle 3 Taxol/carboplatin 04/21/2021, Fulphila ?CTs 05/11/2021-decrease size of FDG avid nodule in right middle lobe, right middle lobe subpleural nodule slightly enlarged, decreased in size of left axillary and subpectoral nodes, unchanged FDG avid right iliac nodes ?Cycle 4 Taxol/carboplatin 05/12/2021, G-CSF discontinued secondary to poor tolerance, Taxol and carboplatin dose reduced ?Cycle 5 Taxol/carboplatin 06/02/2021 ?Cycle 6 carboplatin 07/01/2021, Taxol held due to neuropathy ?Cycle 7 carboplatin 07/29/2021, Taxol held due to neuropathy ?CTs 08/16/2021-new liver lesion, increase in right psoas lymph node, unchanged right hydronephrosis, new mediastinal and right hilar lymphadenopathy, enlargement of bilateral pulmonary nodules and at least one new nodule, new subcarinal node ?Cycle 1 nivolumab 08/25/2021 ?Cycle 2 nivolumab 09/08/2021 ?Cycle 3 nivolumab 09/22/2021 ?Cycle 4 nivolumab 10/06/2021 ?Cycle 5 nivolumab 10/20/2021 ?Cycle 6 nivolumab 11/04/2021 ?CTs 11/12/2021-resolution of lung nodules, decreased size of right psoas node, no remaining mediastinal lymphadenopathy, resolution of right liver lesion ?Cycle 7 nivolumab 11/17/2021 ?Cycle 8 nivolumab 12/01/2021 ?Cycle 9 nivolumab 12/15/2021 ?Cycle 10 nivolumab 12/30/2021 ?Cycle 11 nivolumab 01/12/2022 ?Left labial lesions. Question direct extension from the anal cancer versus metastatic disease from anal  cancer versus a separate malignant process. 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 ?Multiple family members with breast cancer. ?History of hypokalemia-likely secondary to decreased nutritional intake and diarrhea; potassium in normal range 01/16/2017. No longer taking a potassium supplement. ?Right hydroureteronephrosis on CT 01/28/2020, status post a cystoscopy/pyelogram 01/28/2020 confirming extrinsic compression of the right ureter, status post stent placement ?Right ureter stent exchange 06/12/2020 ?Right ureter stent removed 01/13/2021 ?  ?8.  Admission 01/07/2021 with Klebsiella pneumoniae urosepsis/bacteremia  ?9.  Hospital admission 05/15/2021 with UTI/possible right pyelonephritis ?  ?  ? ?Disposition: Ms. Sliter appears stable.  She has completed 10 cycles of nivolumab.  There is no clinical evidence of disease progression.  Plan to proceed with cycle 11 today as scheduled.  She will have restaging CTs after cycle 12 and would like to change to an every 4-week dosing schedule for the nivolumab at that point.  For now she will continue every 2 weeks. ? ?Chemistry panel reviewed.  Labs adequate to proceed with treatment today. ? ?She will return for lab, follow-up, nivolumab in 2 weeks. ? ? ? ?Ned Card ANP/GNP-BC  ? ?01/12/2022  ?1:55 PM ? ? ? ? ? ? ? ?

## 2022-01-12 NOTE — Progress Notes (Signed)
Patient presents for treatment. RN assessment completed along with the following: ? ?Labs/vitals reviewed - Yes, and Please see collab nurse note.    ?Weight within 10% of previous measurement - Yes ?Informed consent completed and reflects current therapy/intent - Yes, on date 08/25/2021             ?Provider progress note reviewed - Yes, today's provider note was reviewed. ?Treatment/Antibody/Supportive plan reviewed - Yes, and there are no adjustments needed for today's treatment. ?S&H and other orders reviewed - Yes, and there are no additional orders identified. ?Previous treatment date reviewed - Yes, and the appropriate amount of time has elapsed between treatments. ?Clinic Hand Off Received from - Yes from [place], RN ? ?Patient to proceed with treatment.  ? ?

## 2022-01-19 ENCOUNTER — Telehealth: Payer: Self-pay | Admitting: Internal Medicine

## 2022-01-19 NOTE — Telephone Encounter (Signed)
Pt is calling and she will ask for gyn for medication ?

## 2022-01-19 NOTE — Telephone Encounter (Signed)
Pt called looking to get a refill for a Rx that helps with her chronic UTI. She received Rx a year ago and will be calling back with the name. Rx was prescribed from [facility].  ? ?FYI. ?

## 2022-01-20 ENCOUNTER — Ambulatory Visit (INDEPENDENT_AMBULATORY_CARE_PROVIDER_SITE_OTHER): Payer: Medicare HMO | Admitting: Family Medicine

## 2022-01-20 ENCOUNTER — Encounter: Payer: Self-pay | Admitting: Family Medicine

## 2022-01-20 VITALS — BP 110/78 | HR 68 | Temp 97.6°F | Wt 156.1 lb

## 2022-01-20 DIAGNOSIS — R319 Hematuria, unspecified: Secondary | ICD-10-CM

## 2022-01-20 DIAGNOSIS — N390 Urinary tract infection, site not specified: Secondary | ICD-10-CM | POA: Diagnosis not present

## 2022-01-20 LAB — POC URINALSYSI DIPSTICK (AUTOMATED)
Bilirubin, UA: NEGATIVE
Glucose, UA: NEGATIVE
Ketones, UA: NEGATIVE
Nitrite, UA: NEGATIVE
Protein, UA: POSITIVE — AB
Spec Grav, UA: 1.015 (ref 1.010–1.025)
Urobilinogen, UA: 0.2 E.U./dL
pH, UA: 6 (ref 5.0–8.0)

## 2022-01-20 MED ORDER — CIPROFLOXACIN HCL 500 MG PO TABS: 500.0000 mg | ORAL_TABLET | Freq: Two times a day (BID) | ORAL | 0 refills | Status: DC

## 2022-01-20 NOTE — Progress Notes (Signed)
? ?  Subjective:  ? ? Patient ID: Ashley Moore, female    DOB: 20-Feb-1951, 71 y.o.   MRN: [id number] ? ?HPI ?Here for 3 days of urgency to urinate and blood in the urine. No fever or back pain. Drinking lots of water.  ? ? ?Review of Systems  ?Constitutional: Negative.   ?Respiratory: Negative.    ?Cardiovascular: Negative.   ?Gastrointestinal: Negative.   ?Genitourinary:  Positive for dysuria, frequency and hematuria. Negative for flank pain.  ? ?   ?Objective:  ? Physical Exam ?Constitutional:   ?   Appearance: Normal appearance. She is not ill-appearing.  ?Cardiovascular:  ?   Rate and Rhythm: Normal rate and regular rhythm.  ?   Pulses: Normal pulses.  ?   Heart sounds: Normal heart sounds.  ?Pulmonary:  ?   Effort: Pulmonary effort is normal.  ?   Breath sounds: Normal breath sounds.  ?Abdominal:  ?   Tenderness: There is no right CVA tenderness or left CVA tenderness.  ?Neurological:  ?   Mental Status: She is alert.  ? ? ? ? ? ?   ?Assessment & Plan:  ?UTI, treat with 7 days of Cipro. Culture the sample.  ?Alysia Penna, MD ? ? ? ?

## 2022-01-22 ENCOUNTER — Other Ambulatory Visit: Payer: Self-pay | Admitting: Oncology

## 2022-01-22 LAB — URINE CULTURE
MICRO NUMBER:: 13352814
SPECIMEN QUALITY:: ADEQUATE

## 2022-01-26 ENCOUNTER — Inpatient Hospital Stay: Payer: Medicare HMO | Admitting: Oncology

## 2022-01-26 ENCOUNTER — Inpatient Hospital Stay: Payer: Medicare HMO | Attending: Nurse Practitioner

## 2022-01-26 ENCOUNTER — Inpatient Hospital Stay: Payer: Medicare HMO

## 2022-01-26 ENCOUNTER — Encounter: Payer: Self-pay | Admitting: *Deleted

## 2022-01-26 VITALS — BP 132/74 | HR 73 | Temp 98.1°F | Resp 18 | Ht 61.0 in | Wt 159.6 lb

## 2022-01-26 VITALS — BP 129/69 | HR 70 | Temp 97.3°F | Resp 18

## 2022-01-26 DIAGNOSIS — C787 Secondary malignant neoplasm of liver and intrahepatic bile duct: Secondary | ICD-10-CM | POA: Diagnosis not present

## 2022-01-26 DIAGNOSIS — C210 Malignant neoplasm of anus, unspecified: Secondary | ICD-10-CM

## 2022-01-26 DIAGNOSIS — Z5112 Encounter for antineoplastic immunotherapy: Secondary | ICD-10-CM | POA: Insufficient documentation

## 2022-01-26 DIAGNOSIS — C772 Secondary and unspecified malignant neoplasm of intra-abdominal lymph nodes: Secondary | ICD-10-CM

## 2022-01-26 DIAGNOSIS — C778 Secondary and unspecified malignant neoplasm of lymph nodes of multiple regions: Secondary | ICD-10-CM | POA: Insufficient documentation

## 2022-01-26 DIAGNOSIS — Z79899 Other long term (current) drug therapy: Secondary | ICD-10-CM | POA: Diagnosis not present

## 2022-01-26 LAB — CMP (CANCER CENTER ONLY)
ALT: 11 U/L (ref 0–44)
AST: 22 U/L (ref 15–41)
Albumin: 3.9 g/dL (ref 3.5–5.0)
Alkaline Phosphatase: 67 U/L (ref 38–126)
Anion gap: 8 (ref 5–15)
BUN: 24 mg/dL — ABNORMAL HIGH (ref 8–23)
CO2: 23 mmol/L (ref 22–32)
Calcium: 9.5 mg/dL (ref 8.9–10.3)
Chloride: 105 mmol/L (ref 98–111)
Creatinine: 1.09 mg/dL — ABNORMAL HIGH (ref 0.44–1.00)
GFR, Estimated: 54 mL/min — ABNORMAL LOW (ref >60)
Glucose, Bld: 77 mg/dL (ref 70–99)
Potassium: 4.1 mmol/L (ref 3.5–5.1)
Sodium: 136 mmol/L (ref 135–145)
Total Bilirubin: 0.3 mg/dL (ref 0.3–1.2)
Total Protein: 7.1 g/dL (ref 6.5–8.1)

## 2022-01-26 LAB — TSH: TSH: 3.7 u[IU]/mL (ref 0.350–4.500)

## 2022-01-26 MED ORDER — SODIUM CHLORIDE 0.9 % IV SOLN: Freq: Once | INTRAVENOUS | Status: AC

## 2022-01-26 MED ORDER — SODIUM CHLORIDE 0.9 % IV SOLN
480.0000 mg | Freq: Once | INTRAVENOUS | Status: AC
  Administered 2022-01-26: 480 mg via INTRAVENOUS
  Filled 2022-01-26: qty 48

## 2022-01-26 NOTE — Patient Instructions (Signed)
Ashley Moore   ?Discharge Instructions: ?Thank you for choosing [facility] to provide your oncology and hematology care.  ? ?If you have a lab appointment with the [facility], please go directly to the [facility] and check in at the registration area. ?  ?Wear comfortable clothing and clothing appropriate for easy access to any Portacath or PICC line.  ? ?We strive to give you quality time with your provider. You may need to reschedule your appointment if you arrive late (15 or more minutes).  Arriving late affects you and other patients whose appointments are after yours.  Also, if you miss three or more appointments without notifying the office, you may be dismissed from the clinic at the provider?s discretion.    ?  ?For prescription refill requests, have your pharmacy contact our office and allow 72 hours for refills to be completed.   ? ?Today you received the following chemotherapy and/or immunotherapy agents Opdivo    ?  ?To help prevent nausea and vomiting after your treatment, we encourage you to take your nausea medication as directed. ? ?BELOW ARE SYMPTOMS THAT SHOULD BE REPORTED IMMEDIATELY: ?*FEVER GREATER THAN 100.4 F (38 ?C) OR HIGHER ?*CHILLS OR SWEATING ?*NAUSEA AND VOMITING THAT IS NOT CONTROLLED WITH YOUR NAUSEA MEDICATION ?*UNUSUAL SHORTNESS OF BREATH ?*UNUSUAL BRUISING OR BLEEDING ?*URINARY PROBLEMS (pain or burning when urinating, or frequent urination) ?*BOWEL PROBLEMS (unusual diarrhea, constipation, pain near the anus) ?TENDERNESS IN MOUTH AND THROAT WITH OR WITHOUT PRESENCE OF ULCERS (sore throat, sores in mouth, or a toothache) ?UNUSUAL RASH, SWELLING OR PAIN  ?UNUSUAL VAGINAL DISCHARGE OR ITCHING  ? ?Items with * indicate a potential emergency and should be followed up as soon as possible or go to the Emergency Department if any problems should occur. ? ?Please show the CHEMOTHERAPY ALERT CARD or IMMUNOTHERAPY ALERT CARD at check-in to the  Emergency Department and triage nurse. ? ?Should you have questions after your visit or need to cancel or reschedule your appointment, please contact [facility]  Dept: [phone]  and follow the prompts.  Office hours are 8:00 a.m. to 4:30 p.m. Monday - Friday. Please note that voicemails left after 4:00 p.m. may not be returned until the following business day.  We are closed weekends and major holidays. You have access to a nurse at all times for urgent questions. Please call the main number to the clinic Dept: [phone] and follow the prompts. ? ? ?For any non-urgent questions, you may also contact your provider using MyChart. We now offer e-Visits for anyone 12 and older to request care online for non-urgent symptoms. For details visit [email or web address]. ?  ?Also download the MyChart app! Go to the app store, search "MyChart", open the app, select [facility], and log in with your MyChart username and password. ? ?Due to Covid, a mask is required upon entering the hospital/clinic. If you do not have a mask, one will be given to you upon arrival. For doctor visits, patients may have 1 support person aged 76 or older with them. For treatment visits, patients cannot have anyone with them due to current Covid guidelines and our immunocompromised population.  ? ?Nivolumab injection ?What is this medication? ?NIVOLUMAB (nye VOL ue mab) is a monoclonal antibody. It treats certain types of cancer. Some of the cancers treated are colon cancer, head and neck cancer, Hodgkin lymphoma, lung cancer, and melanoma. ?This medicine may be used for other purposes; ask  your health care provider or pharmacist if you have questions. ?COMMON BRAND NAME(S): Opdivo ?What should I tell my care team before I take this medication? ?They need to know if you have any of these conditions: ?Autoimmune diseases such as Crohn's disease, ulcerative colitis, or lupus ?Have had or planning to have an  allogeneic stem cell transplant (uses someone else's stem cells) ?History of chest radiation ?Organ transplant ?Nervous system problems such as myasthenia gravis or Guillain-Barre syndrome ?An unusual or allergic reaction to nivolumab, other medicines, foods, dyes, or preservatives ?Pregnant or trying to get pregnant ?Breast-feeding ?How should I use this medication? ?This medication is injected into a vein. It is given in a hospital or clinic setting. ?A special MedGuide will be given to you before each treatment. Be sure to read this information carefully each time. ?Talk to your care team regarding the use of this medication in children. While it may be prescribed for children as young as 12 years for selected conditions, precautions do apply. ?Overdosage: If you think you have taken too much of this medicine contact a poison control center or emergency room at once. ?NOTE: This medicine is only for you. Do not share this medicine with others. ?What if I miss a dose? ?Keep appointments for follow-up doses. It is important not to miss your dose. Call your care team if you are unable to keep an appointment. ?What may interact with this medication? ?Interactions have not been studied. ?This list may not describe all possible interactions. Give your health care provider a list of all the medicines, herbs, non-prescription drugs, or dietary supplements you use. Also tell them if you smoke, drink alcohol, or use illegal drugs. Some items may interact with your medicine. ?What should I watch for while using this medication? ?Your condition will be monitored carefully while you are receiving this medication. ?You may need blood work done while you are taking this medication. ?Do not become pregnant while taking this medication or for 5 months after stopping it. Women should inform their care team if they wish to become pregnant or think they might be pregnant. There is a potential for serious harm to an unborn child.  Talk to your care team for more information. Do not breast-feed an infant while taking this medication or for 5 months after stopping it. ?What side effects may I notice from receiving this medication? ?Side effects that you should report to your care team as soon as possible: ?Allergic reactions--skin rash, itching, hives, swelling of the face, lips, tongue, or throat ?Bloody or black, tar-like stools ?Change in vision ?Chest pain ?Diarrhea ?Dry cough, shortness of breath or trouble breathing ?Eye pain ?Fast or irregular heartbeat ?Fever, chills ?High blood sugar (hyperglycemia)--increased thirst or amount of urine, unusual weakness or fatigue, blurry vision ?High thyroid levels (hyperthyroidism)--fast or irregular heartbeat, weight loss, excessive sweating or sensitivity to heat, tremors or shaking, anxiety, nervousness, irregular menstrual cycle or spotting ?Kidney injury--decrease in the amount of urine, swelling of the ankles, hands, or feet ?Liver injury--right upper belly pain, loss of appetite, nausea, light-colored stool, dark yellow or brown urine, yellowing skin or eyes, unusual weakness or fatigue ?Low red blood cell count--unusual weakness or fatigue, dizziness, headache, trouble breathing ?Low thyroid levels (hypothyroidism)--unusual weakness or fatigue, increased sensitivity to cold, constipation, hair loss, dry skin, weight gain, feelings of depression ?Mood and behavior changes-confusion, change in sex drive or performance, irritability ?Muscle pain or cramps ?Pain, tingling, or numbness in the hands or feet, muscle weakness,  trouble walking, loss of balance or coordination ?Red or dark brown urine ?Redness, blistering, peeling, or loosening of the skin, including inside the mouth ?Stomach pain ?Unusual bruising or bleeding ?Side effects that usually do not require medical attention (report to your care team if they continue or are bothersome): ?Bone pain ?Constipation ?Loss of  appetite ?Nausea ?Tiredness ?Vomiting ?This list may not describe all possible side effects. Call your doctor for medical advice about side effects. You may report side effects to FDA at 1-800-FDA-1088. ?Where should I keep my medica

## 2022-01-26 NOTE — Progress Notes (Signed)
?[place] ?OFFICE PROGRESS NOTE ? ? ?Diagnosis: Anal cancer ? ?INTERVAL HISTORY:  ? ?Ms. Bamberg complete another cycle of nivolumab on 01/13/2019.  She is currently completing a course of ciprofloxacin for urinary tract infection.  She reports mild diarrhea while on ciprofloxacin.  No rash.  No palpable lymph nodes.  She is working.  Leg swelling increased after working outside yesterday. ? ?Objective: ? ?Vital signs in last 24 hours: ? ?Blood pressure 132/74, pulse 73, temperature 98.1 ?F (36.7 ?C), temperature source Oral, resp. rate 18, height '5\' 1"'$  (1.549 m), weight 159 lb 9.6 oz (72.4 kg), SpO2 100 %. ?  ? ?HEENT: Neck without mass ?Lymphatics: No cervical, supraclavicular, or inguinal nodes ?Resp: Lungs with fine end inspiratory rales at the posterior chest bilaterally, no respiratory distress ?Cardio: Regular rate and rhythm ?GI: No hepatosplenomegaly ?Vascular: Mild lower leg edema bilaterally  ?Skin: No rash ? ?Lab Results: ? ?Lab Results  ?Component Value Date  ? WBC 4.1 12/30/2021  ? HGB 11.0 (L) 12/30/2021  ? HCT 35.1 (L) 12/30/2021  ? MCV 87.5 12/30/2021  ? PLT 221 12/30/2021  ? NEUTROABS 3.0 12/30/2021  ? ? ?CMP  ?Lab Results  ?Component Value Date  ? NA 135 01/12/2022  ? K 4.6 01/12/2022  ? CL 103 01/12/2022  ? CO2 22 01/12/2022  ? GLUCOSE 102 (H) 01/12/2022  ? BUN 30 (H) 01/12/2022  ? CREATININE 0.90 01/12/2022  ? CALCIUM 9.7 01/12/2022  ? PROT 7.7 01/12/2022  ? ALBUMIN 4.1 01/12/2022  ? AST 22 01/12/2022  ? ALT 11 01/12/2022  ? ALKPHOS 69 01/12/2022  ? BILITOT 0.3 01/12/2022  ? GFRNONAA >60 01/12/2022  ? GFRAA >60 06/17/2020  ? ? ?Lab Results  ?Component Value Date  ? CEA1 1.15 03/04/2021  ? CEA 1.16 03/04/2021  ? ? ?Medications: I have reviewed the patient's current medications. ? ? ?Assessment/Plan: ?Anal cancer ?CT abdomen/pelvis 10/21/2016-thickening of the anus extending to the junction between the anus and rectum with a large stool ball in the rectum and mild fat stranding  posterior to the rectum. Enlarged lymph nodes in the right and left inguinal regions. A few mildly prominent nodes seen posterior to the rectum. 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 ?PET scan 03/14/9484-[id number] hypermetabolic anal mass with hypermetabolic metastases to the groin region bilaterally, left pelvic sidewall and presacral space. ?Initiation of radiation and cycle 1 5-FU/mitomycin C 11/14/2016 ?Cycle 2 5-FU/mitomycin C 12/12/2016 (5-FU dose reduced due to mucositis, diarrhea, skin breakdown) ?Radiation completed 12/23/2016 ?CT abdomen/pelvis 04/14/2017-resolution of anal mass and bilateral inguinal lymphadenopathy. No residual tumor seen. ?CT abdomen/pelvis 01/28/2020-right hydroureteronephrosis, transition at the level of the mid right ureter, retroperitoneal lymphadenopathy ?CT renal stone study 01/28/2020-severe right hydronephrosis and proximal right hydroureter, 8 mm area of increased attenuation in the proximal right ureter-stone versus soft tissue mass ?PET 02/13/2020-hypermetabolic right retroperitoneal nodal metastases in the aortocaval and posterior pericaval chains, nonspecific mild anal wall hypermetabolism, right nephroureteral stent ?02/21/2020 anorectal exam per Dr. Ashley Jacobs radiation changes of the skin around the anal margin.  Posterior midline smooth scarring.  Mildly stenotic.  Stenosis seems better.  No palpable concerning lesions.  Entire anal canal and distal rectum feels smooth and healthy.  Partial anoscopy performed with no lesions in the anal canal. ?Xeloda/radiation beginning 03/02/2020, completed 04/13/2020 ?CT abdomen/pelvis 06/17/2020-resolution of right retroperitoneal lymphadenopathy, no remaining pathologically enlarged lymph nodes, residual mild right hydronephrosis, no evidence of progressive disease, stable anal wall thickening ?Digital rectal exam by Dr. Quentin Cornwall 08/26/2020-posterior midline  smooth scarring.  Mildly stenotic.  Stenosis seems  better.  No palpable concerning lesions.  Anal canal feels smooth without palpable concern.  Unable to tolerate anoscopy.  Next visit in 6 months for surveillance. ?CT abdomen/pelvis 10/28/2020-no evidence of local recurrence or metastatic disease.  Stable mild rectal wall thickening.  New diffuse bladder wall thickening possibly related to prior radiation ?CT abdomen/pelvis 01/16/2021- recurrent right-sided hydronephrosis and proximal right hydroureter status post removal of right ureter stent, new soft tissue nodule along the course of the right ureter between the right psoas and IVC suspicious for recurrent lymphadenopathy ?PET 02/12/2021-small focus of hypermetabolism at the anorectal junction without a definable mass, right retroperitoneal nodal metastasis with obstruction of the proximal right ureter, right middle lobe hypermetabolic nodule, hypermetabolism in the right hilum without a defined nodal mass, hypermetabolic left supraclavicular and left axillary nodes ?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 ?Cycle 1 Taxol/carboplatin 03/11/2021 ?Cycle 2 Taxol/carboplatin 03/31/2021, Fulphila ?Cycle 3 Taxol/carboplatin 04/21/2021, Fulphila ?CTs 05/11/2021-decrease size of FDG avid nodule in right middle lobe, right middle lobe subpleural nodule slightly enlarged, decreased in size of left axillary and subpectoral nodes, unchanged FDG avid right iliac nodes ?Cycle 4 Taxol/carboplatin 05/12/2021, G-CSF discontinued secondary to poor tolerance, Taxol and carboplatin dose reduced ?Cycle 5 Taxol/carboplatin 06/02/2021 ?Cycle 6 carboplatin 07/01/2021, Taxol held due to neuropathy ?Cycle 7 carboplatin 07/29/2021, Taxol held due to neuropathy ?CTs 08/16/2021-new liver lesion, increase in right psoas lymph node, unchanged right hydronephrosis, new mediastinal and right hilar lymphadenopathy, enlargement of bilateral pulmonary nodules and at least one new nodule, new  subcarinal node ?Cycle 1 nivolumab 08/25/2021 ?Cycle 2 nivolumab 09/08/2021 ?Cycle 3 nivolumab 09/22/2021 ?Cycle 4 nivolumab 10/06/2021 ?Cycle 5 nivolumab 10/20/2021 ?Cycle 6 nivolumab 11/04/2021 ?CTs 11/12/2021-resolution of lung nodules, decreased size of right psoas node, no remaining mediastinal lymphadenopathy, resolution of right liver lesion ?Cycle 7 nivolumab 11/17/2021 ?Cycle 8 nivolumab 12/01/2021 ?Cycle 9 nivolumab 12/15/2021 ?Cycle 10 nivolumab 12/30/2021 ?Cycle 11 nivolumab 01/12/2022 ?Cycle 12 nivolumab 01/27/20-monthy dosing ?Left labial lesions. Question direct extension from the anal cancer versus metastatic disease from anal cancer versus a separate malignant process. 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 ?Multiple family members with breast cancer. ?History of hypokalemia-likely secondary to decreased nutritional intake and diarrhea; potassium in normal range 01/16/2017. No longer taking a potassium supplement. ?Right hydroureteronephrosis on CT 01/28/2020, status post a cystoscopy/pyelogram 01/28/2020 confirming extrinsic compression of the right ureter, status post stent placement ?Right ureter stent exchange 06/12/2020 ?Right ureter stent removed 01/13/2021 ?  ?8.  Admission 01/07/2021 with Klebsiella pneumoniae urosepsis/bacteremia  ?9.  Hospital admission 05/15/2021 with UTI/possible right pyelonephritis ?  ?  ? ? ?Disposition: ?Ms. KHebnerappears stable.  She is tolerating the nivolumab well.  There is no clinical evidence of disease progression.  She will complete another treat with nivolumab today.  Nivolumab will be changed to monthly dosing beginning today.  She will return for an office visit in nivolumab in 4 weeks. ?She will be scheduled for restaging CTs after the next cycle of nivolumab. ? ?GBetsy Coder MD ? ?01/26/2022  ?10:13 AM ? ? ?

## 2022-01-26 NOTE — Progress Notes (Signed)
Patient presents for treatment. RN assessment completed along with the following: ? ?Labs/vitals reviewed - Yes, and within treatment parameters.   ?Weight within 10% of previous measurement - Yes ?Informed consent completed and reflects current therapy/intent -  1 10/26/21            ?Provider progress note reviewed - Yes, today's provider note was reviewed. ?Treatment/Antibody/Supportive plan reviewed - Yes, on date Yes, on date Please note that modifications are being made to the treatment plan including Changing to monthly maintenance dosing today of 480 mg (OK per insurance)   ?S&H and other orders reviewed - Yes, and there are no additional orders identified. ?Previous treatment date reviewed - Yes, and the appropriate amount of time has elapsed between treatments. ?Clinic Hand Off Received from - Cristy Friedlander, RN ? ? ?Patient to proceed with treatment.  ? ?

## 2022-01-26 NOTE — Progress Notes (Signed)
Patient seen by Dr. Benay Spice today ? ?Vitals are within treatment parameters. ? ?Labs reviewed by Dr. Benay Spice and are within treatment parameters. ? ?Per physician team, patient is ready for treatment. Please note that modifications are being made to the treatment plan including Changing to monthly maintenance dosing today of 480 mg (OK per insurance)   ?

## 2022-01-28 ENCOUNTER — Encounter: Payer: Self-pay | Admitting: Oncology

## 2022-01-31 ENCOUNTER — Encounter: Payer: Medicare HMO | Admitting: Physical Therapy

## 2022-02-02 IMAGING — US US RENAL
1 series · 14 of 23 positions shown · non-contrast
Comparison: None.

CLINICAL DATA: Right flank pain.

EXAM:
RENAL / URINARY TRACT ULTRASOUND COMPLETE

[Series 1: us renal · 14 of 23 slices shown]
[im 1/23]
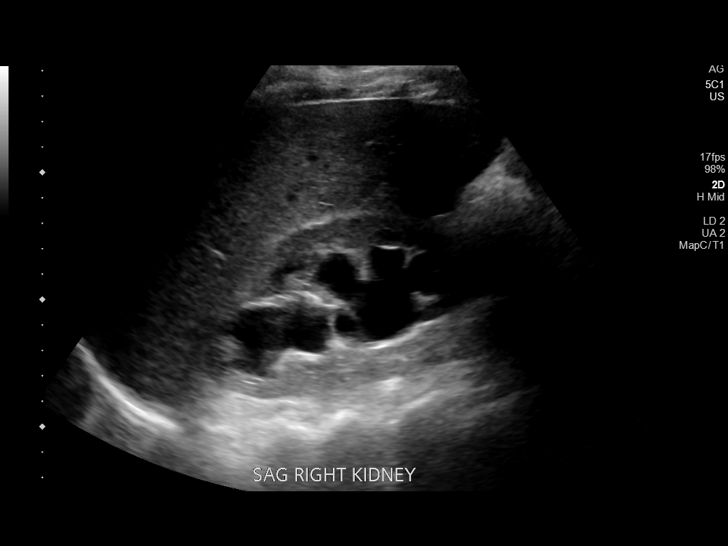
[im 3/23]
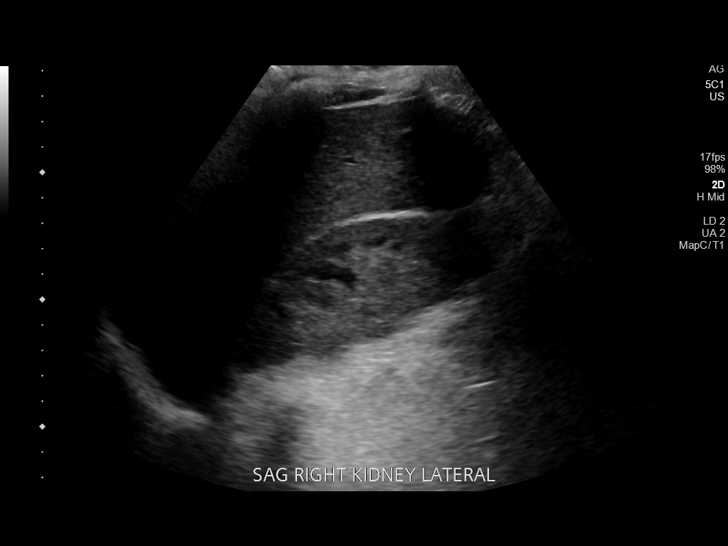
[im 5/23]
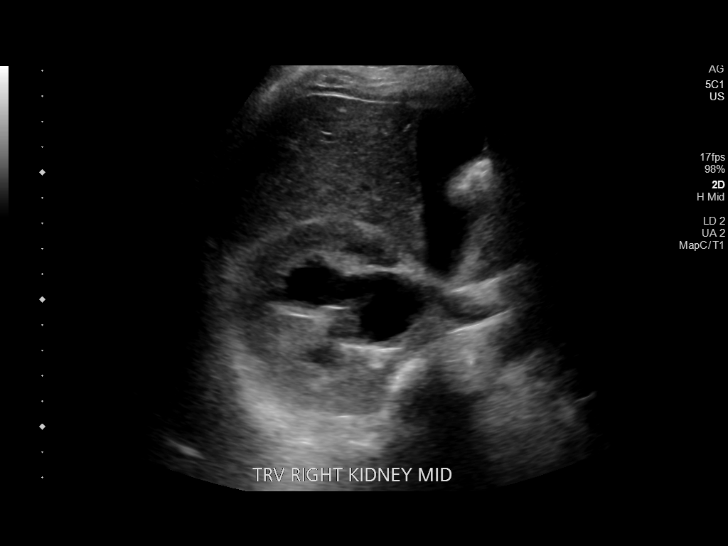
[im 6/23]
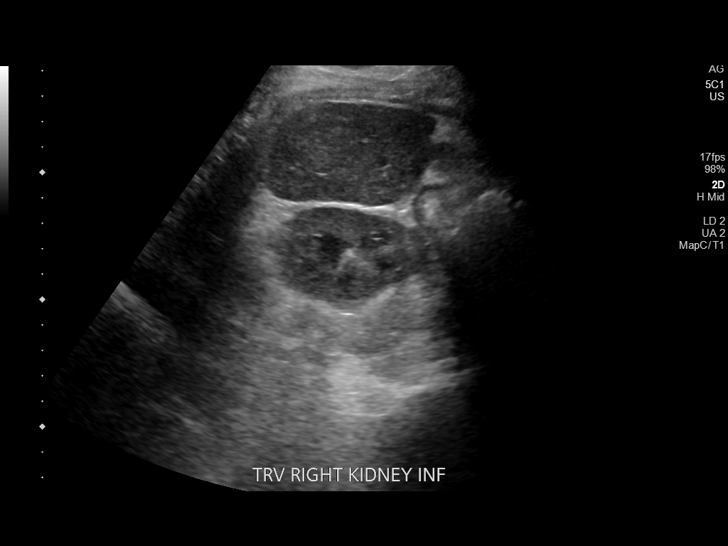
[im 8/23]
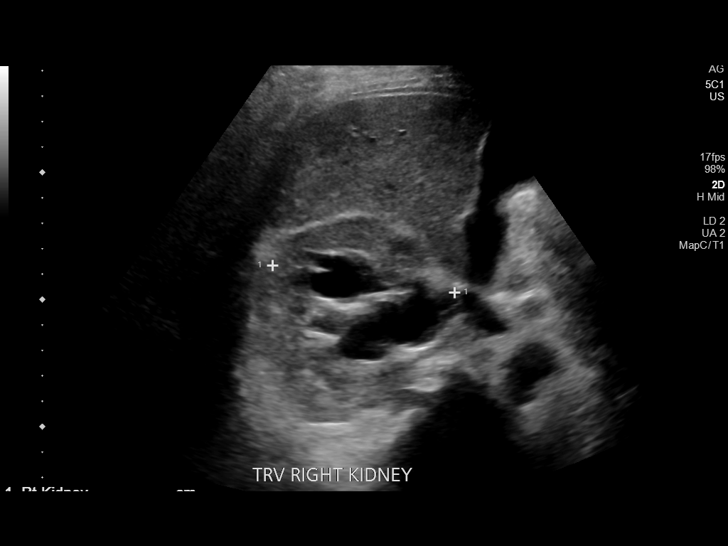
[im 10/23]
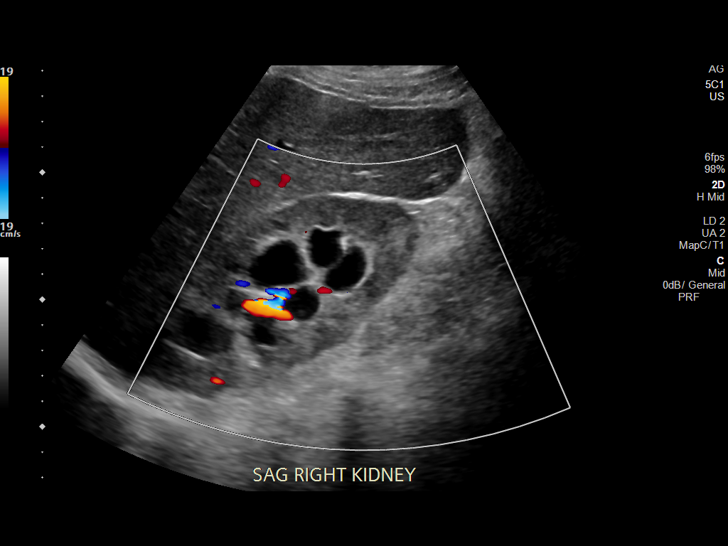
[im 11/23]
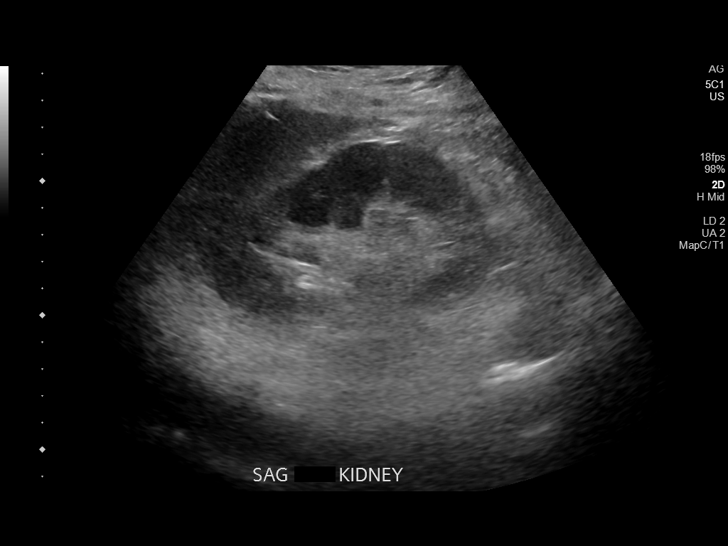
[im 13/23]
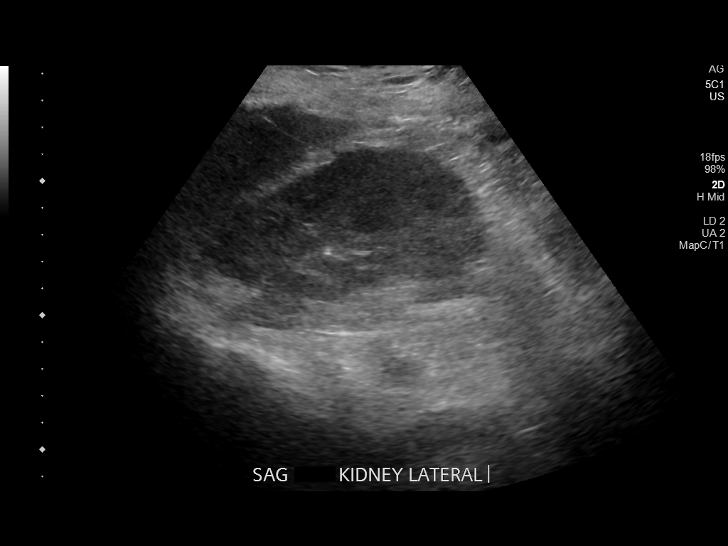
[im 14/23]
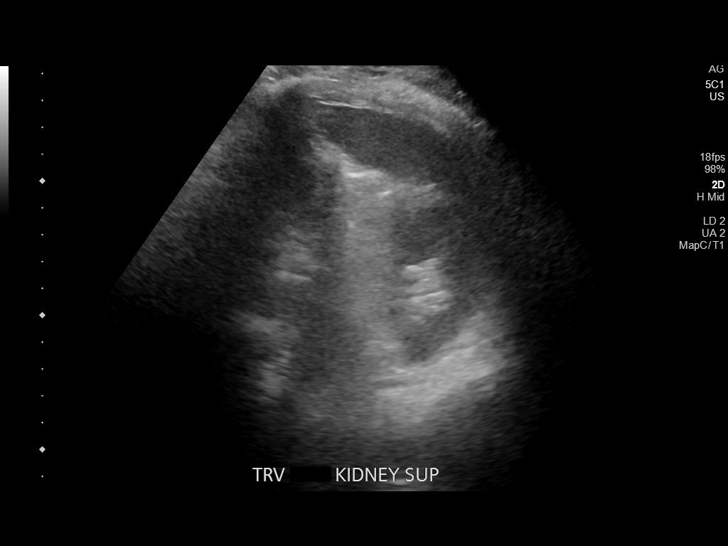
[im 16/23]
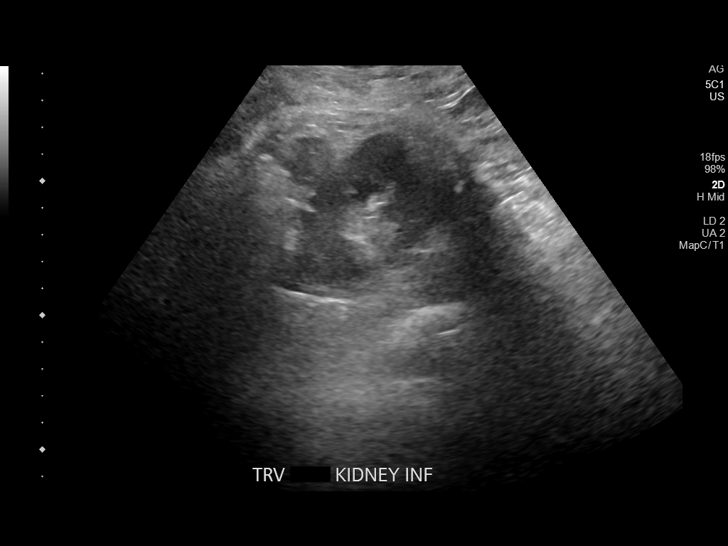
[im 18/23]
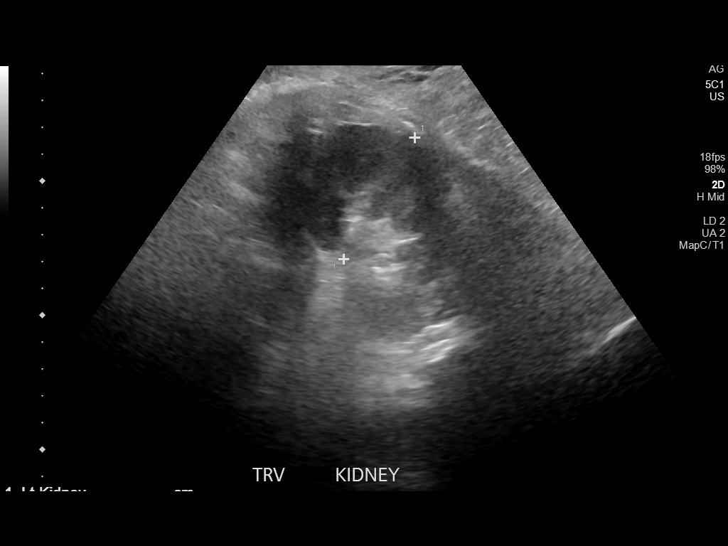
[im 19/23]
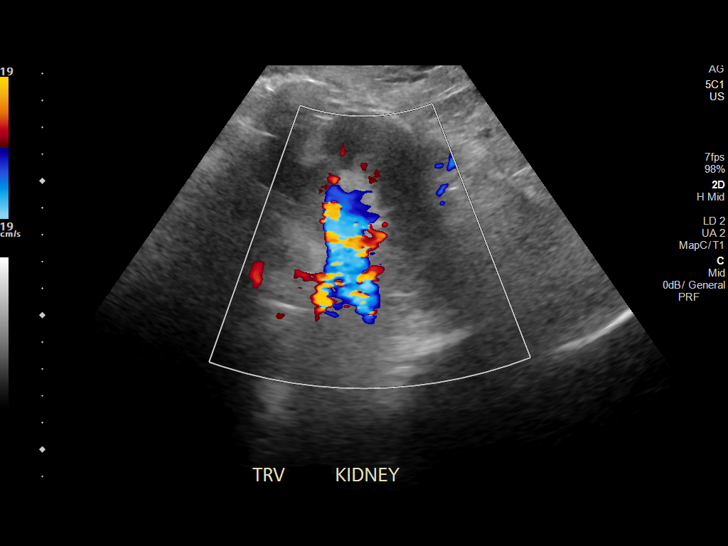
[im 21/23]
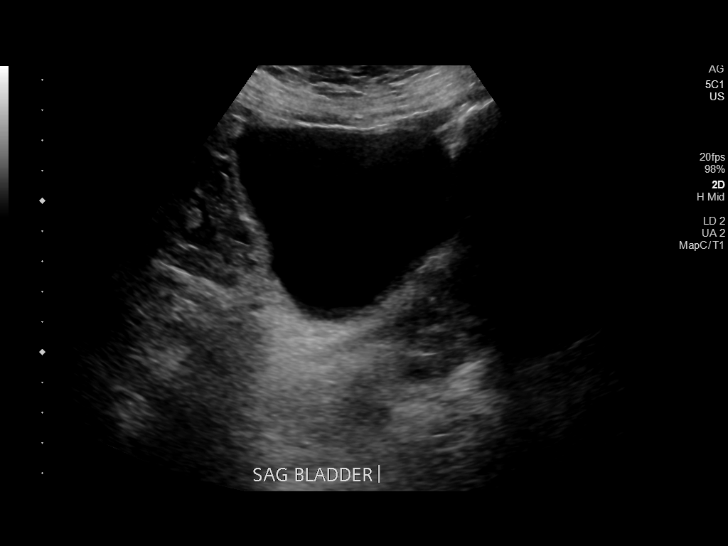
[im 23/23]
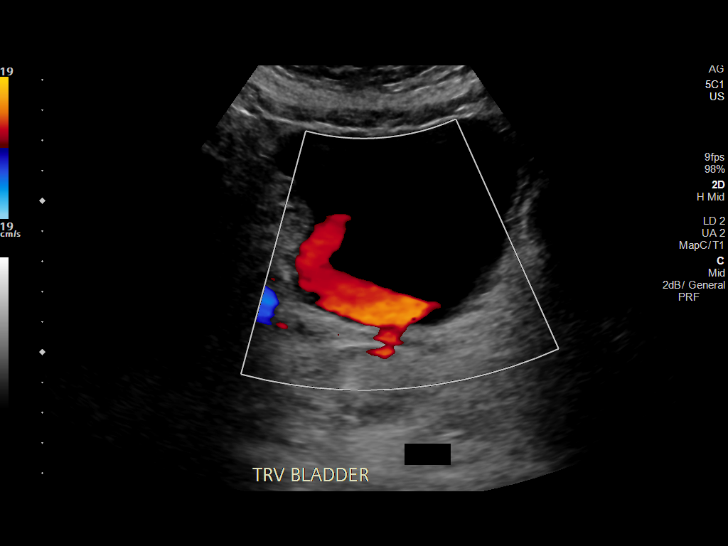

[14 of 23 positions shown; findings below may reference images not displayed]

FINDINGS: Right Kidney:

Renal measurements: 11.3 cm x 5.7 cm x 7.2 cm = volume: 245 mL.
Echogenicity within normal limits. There is moderate severity
right-sided hydronephrosis. No mass is visualized.

Left Kidney:

Renal measurements: 10.1 cm x 6.6 cm x 5.2 cm = volume: 184 mL.
Echogenicity within normal limits. No mass or hydronephrosis
visualized.

Bladder:

Appears normal for degree of bladder distention.

Other:

None.
IMPRESSION: Moderate severity right-sided hydronephrosis without visualization
of obstructing renal calculi.

## 2022-02-07 ENCOUNTER — Ambulatory Visit: Payer: Medicare HMO | Admitting: Physical Therapy

## 2022-02-07 ENCOUNTER — Encounter: Payer: Self-pay | Admitting: Physical Therapy

## 2022-02-07 DIAGNOSIS — M6281 Muscle weakness (generalized): Secondary | ICD-10-CM

## 2022-02-07 DIAGNOSIS — M25511 Pain in right shoulder: Secondary | ICD-10-CM

## 2022-02-07 DIAGNOSIS — M25611 Stiffness of right shoulder, not elsewhere classified: Secondary | ICD-10-CM | POA: Diagnosis not present

## 2022-02-07 DIAGNOSIS — R293 Abnormal posture: Secondary | ICD-10-CM | POA: Diagnosis not present

## 2022-02-07 DIAGNOSIS — G8929 Other chronic pain: Secondary | ICD-10-CM | POA: Diagnosis not present

## 2022-02-07 DIAGNOSIS — R600 Localized edema: Secondary | ICD-10-CM

## 2022-02-07 NOTE — Therapy (Addendum)
OUTPATIENT PHYSICAL THERAPY TREATMENT NOTE           Patient Name: Ashley Moore MRN: [id number] DOB:[birth year], 71 y.o., female Today's Date: 02/07/2022  PCP: [name], MD REFERRING PROVIDER: [name], MD    PT End of Session - 02/07/22 1525     Visit Number 12    Number of Visits 22    Date for PT Re-Evaluation 04/08/22    Authorization Type [employer] 12 visits applied for on 10/19/2021 to 12/17/2021, Progress note Recert sent  03/04/736 at 10th visit for 1x/ week for 12 weeks beginning 01/17/2022.    Authorization Time Period Pt approved for 12 visits from 01/04/2022 to 02/18/2022    Progress Note Due on Visit 20    PT Start Time 1518    PT Stop Time 1605    PT Time Calculation (min) 47 min    Activity Tolerance Patient tolerated treatment well    Behavior During Therapy [facility] for tasks assessed/performed               PT End of Session - 02/07/22 1525     Visit Number 12    Number of Visits 22    Date for PT Re-Evaluation 04/08/22    Authorization Type [employer] 12 visits applied for on 10/19/2021 to 12/17/2021, Progress note Recert sent  09/24/2692 at 10th visit for 1x/ week for 12 weeks beginning 01/17/2022.    Authorization Time Period Pt approved for 12 visits from 01/04/2022 to 02/18/2022    Progress Note Due on Visit 57    PT Start Time 1518    PT Stop Time 1605    PT Time Calculation (min) 47 min    Activity Tolerance Patient tolerated treatment well    Behavior During Therapy WFL for tasks assessed/performed                     Past Medical History:  Diagnosis Date   Arthritis    Bowen's disease    excised 1992   Chronic low back pain    Chronic radiation cystitis    DDD (degenerative disc disease), cervical    Family history of breast cancer    Family history of lung cancer    Family history of non-Hodgkin's lymphoma    Family history of ovarian cancer    Headache    Hx of varicella    Hydronephrosis of right kidney     urologist--- dr Tresa Moore,  malignant,  treated with ureter stent   Hypothyroidism    followed by pcp   Lower urinary tract symptoms (LUTS)    01-12-2021  per pt treated with accupuncture at dr Tresa Moore office   Lymphedema of both lower extremities    PICC (peripherally inserted central catheter) in place 01/11/2021   placed at [facility] in [place], [place] for IV antibiotics   Positive blood culture 01/08/2021   Klebsiella Pneumaniae,  treated with daily IV antibiotic   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[facility]) oncologist--- dr Benay Spice   dx 02/ 2018,  invasive SCC , completed chemo/ radiation 12-23-2016;  recurrent metastatsis retroperitoneal lymphdenopathy,  completed radiation 04-13-2020 residual right ureteral uropathy obstruction   Retroperitoneal lymphadenopathy    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   Sepsis due to Klebsiella pneumoniae ([place]) 01/07/2021   pt admitted to [facility] in [place],  dx sepsis secondary to acute pyelonephritis with bacteremia , positive blood culture,  discharged 01-11-2021 home daily  IV antibiotic   Wears glasses    Past Surgical History:  Procedure Laterality Date   BUNIONECTOMY Right yrs ago   [place] Right 06/12/2020   Procedure: CYSTOSCOPY WITH RETROGRADE PYELOGRAM/URETERAL STENT EXCHANGE;  Surgeon: Alexis Frock, MD;  Location: [facility];  Service: Urology;  Laterality: Right;   CYSTOSCOPY W/ URETERAL STENT PLACEMENT Right 01/13/2021   Procedure: CYSTOSCOPY WITH RETROGRADE PYELOGRAM/URETERAL STENT REMOVALRIGHT;  Surgeon: Alexis Frock, MD;  Location: [facility];  Service: Urology;  Laterality: Right;  [place], URETEROSCOPY AND STENT PLACEMENT Right 01/28/2020   Procedure: CYSTOSCOPY WITH RETROGRADE PYELOGRAM, URETEROSCOPY AND STENT PLACEMENT;  Surgeon: Alexis Frock, MD;  Location: [facility];  Service: Urology;   Laterality: Right;   IR GENERIC HISTORICAL  11/14/2016   IR US GUIDE VASC ACCESS RIGHT 11/14/2016 [facility]   IR GENERIC HISTORICAL  11/14/2016   IR FLUORO GUIDE CV LINE RIGHT 11/14/2016 [facility]   IR GENERIC HISTORICAL  12/12/2016   IR US GUIDE VASC ACCESS RIGHT 12/12/2016 [name], MD [facility]   IR GENERIC HISTORICAL  12/12/2016   IR FLUORO GUIDE CV LINE RIGHT 12/12/2016 [name], MD [facility]   SKIN CANCER EXCISION  1990   bowens disease   Patient Active Problem List   Diagnosis Date Noted   Primary osteoarthritis, 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/28/2021   Atypical chest pain 06/29/2021   Intractable abdominal pain 05/16/2021   Acute pyelonephritis 05/16/2021   Acute cystitis without hematuria 05/16/2021   Hydronephrosis of right kidney 05/16/2021   Obstructive uropathy 05/16/2021   Hypothyroidism 05/16/2021   Hyponatremia 05/16/2021   Obesity (BMI 30-39.9) 05/16/2021   Goals of care, counseling/discussion 03/01/2021   SIRS (systemic inflammatory response syndrome) ([place]) 01/13/2021   Pyelonephritis 01/13/2021   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   Genetic testing 01/27/2020   Family history of breast cancer    Family history of lung cancer    Family history of ovarian cancer    Family history of non-Hodgkin's lymphoma    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   Moore catheter in place 11/21/2016   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   Anal cancer ([place]) 11/03/2016   Cystitis 05/06/2015   Degenerative cervical disc 04/08/2015   Trigger point of neck 03/26/2015   Pain in joint, ankle and foot 04/23/2013   Visit for preventive health examination 02/13/2013   Routine gynecological examination 02/13/2013   Family history of breast cancer in first degree relative 02/13/2013   Rash and nonspecific skin eruption 02/13/2013   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   VITAMIN D DEFICIENCY 06/25/2008   NECK PAIN 06/02/2008   FOOT PAIN 06/02/2008    Osteopenia 06/02/2008    REFERRING DIAG: M25.511,G89.29 (ICD-10-CM) - Chronic right shoulder pain   THERAPY DIAG:  Stiffness of right shoulder, not elsewhere classified  Chronic right shoulder pain  Localized edema  Abnormal posture  Muscle weakness (generalized)  PERTINENT HISTORY: Arthritis Bowen's diseaseexcised 1992 Chronic low back pain Chronic radiation cystitis DDD (degenerative disc disease), cervical Family history of breast cancer Family history of lung cancer Family history of non-Hodgkin's lymphoma Family history of ovarian cancer Headache Hx of varicella Hydronephrosis of right kidney rectal cancer   PRECAUTIONS: Other: infusion therapy for Cancer , fall precuations   SUBJECTIVE:Pt arriving today reporting 5/10 in Rt shoulder with movements  PAIN:  Are you having pain? Yes NPRS scale: 5/10 Pain location: anterior shoulder and lateral Rt  shoulder into elbow Pain orientation: Right  PAIN TYPE: aching Pain description: constant  Aggravating factors: reaching upward, lateral movements Relieving factors: resting and heat    OBJECTIVE:      PATIENT SURVEYS:  FOTO: 51% on 10/19/2021 FOTO: 53% on 01/04/2022  UPPER EXTREMITY AROM/PROM:   Active ROM  In supine Right 10/19/2021 Left 10/19/2021  Shoulder flexion 120 164  Shoulder extension 30 45    Shoulder abduction 98 160  Shoulder adduction      Shoulder internal rotation 75 shoulder abd 45 degrees 75  Shoulder external rotation 15 Shoulder abd 45 degrees 80  Elbow flexion 140   145  Elbow extension 0 0  Wrist flexion      Wrist extension      Wrist ulnar deviation      Wrist radial deviation      Wrist pronation      Wrist supination      (Blank rows = not tested)   Passive ROM In degrees Right 10/19/21 Right 11/09/21 Right  11/16/21 Right 12/06/21 Rt 12/13/21 Rt 01/04/22 supine  Shoulder flexion 130  120 134 145 145 155  Shoulder extension 35        Shoulder abduction 105 104  110 120 120 150   Shoulder adduction          Shoulder internal rotation 80 shoulder abd 45 degrees    80 shoulder  abducted 45 degrees 78 shoulder  abducted 45 degrees 75 degrees (Shoulder abd 45 degrees)  Shoulder external rotation 20 Shoulder abd 45 degrees 30 Shoulder abducted to 45 degrees   30 Shoulder abducted to 45 degrees 30 Shoulder abducted to 45 degrees 28 degrees Shoulder abd 45 degrees)  Elbow flexion          Elbow extension          Wrist flexion          Wrist extension          Wrist ulnar deviation          Wrist radial deviation          Wrist pronation          Wrist supination              UPPER EXTREMITY MMT:   MMT Right 10/19/2021 Left 10/19/2021 Right 12/06/2021 Rt 12/28/2021  Shoulder flexion 2+/5 4+/5 3-/5 3/5  Shoulder extension 2+/5 4+/5 3-/5 3/5  Shoulder abduction 2+/5 4+/5 3-/5 3/5  Shoulder adduction        Shoulder internal rotation 2+/5 4+/5 3-/5 3/5  Shoulder external rotation 2+/5 4+/5 3-/5 3/5  Middle trapezius        Lower trapezius        Elbow flexion        Elbow extension        Wrist flexion        Wrist extension        Wrist ulnar deviation        Wrist radial deviation        Wrist pronation        Wrist supination        Grip strength (lbs) 31.8 pounds 48 pounds    29.8 pounds  (Blank rows = not tested)                  TODAY'S TREATMENT:   02/07/2022:  TherEx:  Isometrics using yellow physioball x 10 (flexion, extension, ER and IR) holding 5 seconds each Sidelying  ER with minimal movements Bicep curls with 1 # weight x 10 Ball rolling up wall x 10 with bilateral UE's UE ranger scaption x 1 minute in sitting Pulleys scaption x 2 minutes Pulleys in abduction x 2 minutes Manual: PROM: Rt shoulder flexion, ER, abduction, scapular mobs, shoulder mobs Grade 2-3 GH AP and inferior mobs Modalities:  Moist heat: 10 minutes to Rt posterior shoulder during ER stretching in supine   01/10/2022:  TherEx:  UBE: Level 2.5 x 6 miutes (3  each direction) Ball rolling up wall x 10 with bilateral UE's UE ranger scaption x 1 minute in sitting Pulleys scaption x 2 minutes Pulleys in abduction x 2 minutes Manual: IASTM to Rt shoulder and Rt upper trap/levator and rhomboids Modalities:  Moist heat: 6 minutes to Rt shoulder     01/04/2022:   TherEx: UBE L2 x 6 minutes (3 minutes each direction)  UE ranger: flexion x 2 minutes           Scaption x 2 minutes          Circles clock wise and counter clock wise each x 20  Shoulder isometrics: 5 x 5 seconds (flexion, ER, IR, Extension)  Sit to stand: no UE support 14 seconds from standard arm chair    Manual:    STM to bilateral cervical paraspinals, bilateral upper trap, bilateral levator scapulae, bilateral shomboids. Scapular mobs performed on Rt UE.   PROM: Rt UE in all planes, Grade 2-3 GH inferior mobs and AP mobs performed         PATIENT EDUCATION: Education details:added isometrics to pt's HEP  Person educated: Patient Education method: Explanation Education comprehension: verbalized understanding, returned demonstration, and verbal cues required     HOME EXERCISE PROGRAM: Access Code: [email or web address] URL: [email or web address] Date: 10/19/2021 Prepared by: Kearney Hard   Exercises Seated Shoulder Flexion Towel Slide at Table Top - 2-3 x daily - 7 x weekly - 1 sets - 10 reps - 5-10 seocnds hold Seated Shoulder External Rotation PROM on Table - 2-3 x daily - 7 x weekly - 1 sets - 10 reps - 5-10 seconds hold Seated Scapular Retraction - 2-3 x daily - 7 x weekly - 1 sets - 10 reps - 5-10 seconds hold     ASSESSMENT:   CLINICAL IMPRESSION: 02/07/2022: Pt arriving today reporting 5/10 pain in Rt shoulder at rest. Pt stating she has been in bed most of last week due to double infusions. Pt stating overall she is making progress and was able to lift a bottle of water on a shelf in her refrigerator. Pt still with limited ER both passive and active.  Pt 's AAROM in her Rt shoulder flexion was 145 degrees. Continue skilled PT to maximize pt's function.       REHAB POTENTIAL: fair, due to co-morbidities   CLINICAL DECISION MAKING: Evolving/moderate complexity   EVALUATION COMPLEXITY: Moderate     GOALS: Goals reviewed with patient? Yes   SHORT TERM GOALS:   STG Name Target Date Goal status  1 Pt will be independent with her initial HEP.    11/16/2021 Met 11/09/2021  2 Pt will be able to perform 5 time sit to stand with no UE support </= 15 seconds Baseline:  11/16/2021 MET  01/04/2022    LONG TERM GOALS:    LTG Name Target Date Goal status  1 PT will be independent in her advanced HEP.  Baseline: 01/11/2022 MET  01/04/22  2 Pt will improve FOTO score  to >/= 63 % Baseline: 53% on 01/04/2022 04/08/22 Ongoing 01/04/22  3 Pt will improve her right shoulder ER to >/= 30 degrees to improve functional mobility.  Baseline: 04/08/22 Ongoing 01/04/22  4 Pt will be able to report pain of </= 2/10 with ADL's using her right UE.  Baseline: pain depends on the day 01/04/22 04/08/22 Partially Met  01/04/22       5.   Pt will be able to reach her left under arm for donning deodorant.  04/08/22  New Goal  01/04/22                      PLAN: PT FREQUENCY: 1x/ week for 12 weeks: Recert sent on 02/03/3357 Approved for 12 visits from 01/04/2022 -  02/18/2022   PT DURATION: 12 weeks   PLANNED INTERVENTIONS: Therapeutic exercises, Therapeutic activity, Neuro Muscular re-education, Balance training, Gait training, Patient/Family education, Joint mobilization, Dry Needling, Cryotherapy, Moist heat, Taping, Vasopneumatic device, and Manual therapy   PLAN FOR NEXT SESSION:  shoulder isometrics, shoulder ROM, mobs, PROM, gentle stretching and strengthening as tolerated      Oretha Caprice, PT, MPT 02/07/2022, 4:17 PM

## 2022-02-08 ENCOUNTER — Ambulatory Visit: Payer: Self-pay | Admitting: Nurse Practitioner

## 2022-02-08 ENCOUNTER — Inpatient Hospital Stay: Payer: Medicare HMO

## 2022-02-08 ENCOUNTER — Ambulatory Visit: Payer: Self-pay

## 2022-02-09 ENCOUNTER — Other Ambulatory Visit: Payer: Self-pay

## 2022-02-09 ENCOUNTER — Ambulatory Visit: Payer: Self-pay

## 2022-02-09 ENCOUNTER — Ambulatory Visit: Payer: Self-pay | Admitting: Nurse Practitioner

## 2022-02-15 ENCOUNTER — Ambulatory Visit: Payer: Medicare HMO | Admitting: Physical Therapy

## 2022-02-15 ENCOUNTER — Encounter: Payer: Self-pay | Admitting: Physical Therapy

## 2022-02-15 DIAGNOSIS — M6281 Muscle weakness (generalized): Secondary | ICD-10-CM | POA: Diagnosis not present

## 2022-02-15 DIAGNOSIS — M25511 Pain in right shoulder: Secondary | ICD-10-CM | POA: Diagnosis not present

## 2022-02-15 DIAGNOSIS — G8929 Other chronic pain: Secondary | ICD-10-CM

## 2022-02-15 DIAGNOSIS — M25611 Stiffness of right shoulder, not elsewhere classified: Secondary | ICD-10-CM

## 2022-02-15 DIAGNOSIS — R600 Localized edema: Secondary | ICD-10-CM | POA: Diagnosis not present

## 2022-02-15 DIAGNOSIS — R293 Abnormal posture: Secondary | ICD-10-CM

## 2022-02-15 NOTE — Therapy (Signed)
OUTPATIENT PHYSICAL THERAPY TREATMENT NOTE        Patient Name: Ashley Moore MRN: [id number] DOB:24-Nov-1950, 71 y.o., female Today's Date: 02/15/2022  PCP: [name], MD REFERRING PROVIDER: Persons, [name], [place]    PT End of Session - 02/15/22 1532     Visit Number 13    Number of Visits 22    Date for PT Re-Evaluation 04/08/22    Authorization Type [employer] 12 visits applied for on 10/19/2021 to 12/17/2021, Progress note Recert sent  4/00/8676 at 10th visit for 1x/ week for 12 weeks beginning 01/17/2022.    Progress Note Due on Visit 20    PT Start Time 1518    PT Stop Time 1600    PT Time Calculation (min) 42 min    Activity Tolerance Patient tolerated treatment well    Behavior During Therapy [facility] for tasks assessed/performed               PT End of Session - 02/15/22 1532     Visit Number 13    Number of Visits 22    Date for PT Re-Evaluation 04/08/22    Authorization Type [employer] 12 visits applied for on 10/19/2021 to 12/17/2021, Progress note Recert sent  1/95/0932 at 10th visit for 1x/ week for 12 weeks beginning 01/17/2022.    Progress Note Due on Visit 20    PT Start Time 1518    PT Stop Time 1600    PT Time Calculation (min) 42 min    Activity Tolerance Patient tolerated treatment well    Behavior During Therapy [facility] for tasks assessed/performed                     Past Medical History:  Diagnosis Date   Arthritis    Bowen's disease    excised 1992   Chronic low back pain    Chronic radiation cystitis    DDD (degenerative disc disease), cervical    Family history of breast cancer    Family history of lung cancer    Family history of non-Hodgkin's lymphoma    Family history of ovarian cancer    Headache    Hx of varicella    Hydronephrosis of right kidney    urologist--- dr Tresa Moore,  malignant,  treated with ureter stent   Hypothyroidism    followed by pcp   Lower urinary tract symptoms (LUTS)    01-12-2021  per pt treated with  accupuncture at dr Tresa Moore office   Lymphedema of both lower extremities    PICC (peripherally inserted central catheter) in place 01/11/2021   placed at [facility] in [place], [place] for IV antibiotics   Positive blood culture 01/08/2021   Klebsiella Pneumaniae,  treated with daily IV antibiotic   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[facility]) oncologist--- dr Benay Spice   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,  invasive SCC , completed chemo/ radiation 12-23-2016;  recurrent metastatsis retroperitoneal lymphdenopathy,  completed radiation 04-13-2020 residual right ureteral uropathy obstruction   Retroperitoneal lymphadenopathy    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   Sepsis due to Klebsiella pneumoniae ([place]) 01/07/2021   pt admitted to [facility] in [place],  dx sepsis secondary to acute pyelonephritis with bacteremia , positive blood culture,  discharged 01-11-2021 home daily IV antibiotic   Wears glasses    Past Surgical History:  Procedure Laterality Date   BUNIONECTOMY Right yrs ago   [place] Right 06/12/2020  Procedure: CYSTOSCOPY WITH RETROGRADE PYELOGRAM/URETERAL STENT EXCHANGE;  Surgeon: Alexis Frock, MD;  Location: [facility];  Service: Urology;  Laterality: Right;   CYSTOSCOPY W/ URETERAL STENT PLACEMENT Right 01/13/2021   Procedure: CYSTOSCOPY WITH RETROGRADE PYELOGRAM/URETERAL STENT REMOVALRIGHT;  Surgeon: Alexis Frock, MD;  Location: [facility];  Service: Urology;  Laterality: Right;  [place], URETEROSCOPY AND STENT PLACEMENT Right 01/28/2020   Procedure: CYSTOSCOPY WITH RETROGRADE PYELOGRAM, URETEROSCOPY AND STENT PLACEMENT;  Surgeon: Alexis Frock, MD;  Location: [facility];  Service: Urology;  Laterality: Right;   IR GENERIC HISTORICAL  11/14/2016   IR US GUIDE VASC ACCESS RIGHT 11/14/2016 [facility]   IR GENERIC HISTORICAL  11/14/2016   IR FLUORO GUIDE CV LINE RIGHT  11/14/2016 [facility]   IR GENERIC HISTORICAL  12/12/2016   IR US GUIDE VASC ACCESS RIGHT 12/12/2016 [name], MD [facility]   IR GENERIC HISTORICAL  12/12/2016   IR FLUORO GUIDE CV LINE RIGHT 12/12/2016 [name], MD [facility]   SKIN CANCER EXCISION  1990   bowens disease   Patient Active Problem List   Diagnosis Date Noted   Primary osteoarthritis, 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/28/2021   Atypical chest pain 06/29/2021   Intractable abdominal pain 05/16/2021   Acute pyelonephritis 05/16/2021   Acute cystitis without hematuria 05/16/2021   Hydronephrosis of right kidney 05/16/2021   Obstructive uropathy 05/16/2021   Hypothyroidism 05/16/2021   Hyponatremia 05/16/2021   Obesity (BMI 30-39.9) 05/16/2021   Goals of care, counseling/discussion 03/01/2021   SIRS (systemic inflammatory response syndrome) ([place]) 01/13/2021   Pyelonephritis 01/13/2021   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   Genetic testing 01/27/2020   Family history of breast cancer    Family history of lung cancer    Family history of ovarian cancer    Family history of non-Hodgkin's lymphoma    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   Port catheter in place 11/21/2016   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   Anal cancer ([place]) 11/03/2016   Cystitis 05/06/2015   Degenerative cervical disc 04/08/2015   Trigger point of neck 03/26/2015   Pain in joint, ankle and foot 04/23/2013   Visit for preventive health examination 02/13/2013   Routine gynecological examination 02/13/2013   Family history of breast cancer in first degree relative 02/13/2013   Rash and nonspecific skin eruption 02/13/2013   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   VITAMIN D DEFICIENCY 06/25/2008   NECK PAIN 06/02/2008   FOOT PAIN 06/02/2008   Osteopenia 06/02/2008    REFERRING DIAG: M25.511,G89.29 (ICD-10-CM) - Chronic right shoulder pain   THERAPY DIAG:  Stiffness of right shoulder, not elsewhere  classified  Chronic right shoulder pain  Localized edema  Abnormal posture  Muscle weakness (generalized)  PERTINENT HISTORY: Arthritis Bowen's diseaseexcised 1992 Chronic low back pain Chronic radiation cystitis DDD (degenerative disc disease), cervical Family history of breast cancer Family history of lung cancer Family history of non-Hodgkin's lymphoma Family history of ovarian cancer Headache Hx of varicella Hydronephrosis of right kidney rectal cancer   PRECAUTIONS: Other: infusion therapy for Cancer , fall precuations   SUBJECTIVE:Pt arriving today reporting no pain in her Rt shoulder. Pt stating today was a good day.   PAIN:  Are you having pain? no NPRS scale: 0 Pain location: anterior shoulder and lateral Rt shoulder into elbow Pain orientation: Right  PAIN TYPE: aching Pain description: constant  Aggravating factors: reaching upward, lateral movements Relieving factors: resting and heat  OBJECTIVE:      PATIENT SURVEYS:  FOTO: 51% on 10/19/2021 FOTO: 53% on 01/04/2022  UPPER EXTREMITY AROM/PROM:   Active ROM  In supine Right 10/19/2021 Left 10/19/2021 Rt  02/15/22  Shoulder flexion 120 164 130  Shoulder extension 30 45   50   Shoulder abduction 98 160 125  Shoulder adduction       Shoulder internal rotation 75 shoulder abd 45 degrees 75 75 Shoulder abd 45 deg  Shoulder external rotation 15 Shoulder abd 45 degrees 80 10 Shoulder abd 45 deg  Elbow flexion 140   145 145  Elbow extension 0 0 0  Wrist flexion       Wrist extension       Wrist ulnar deviation       Wrist radial deviation       Wrist pronation       Wrist supination       (Blank rows = not tested)   Passive ROM In degrees Right 10/19/21 Right 11/09/21 Right  11/16/21 Right 12/06/21 Rt 12/13/21 Rt 01/04/22 supine Rt 02/15/22 supine  Shoulder flexion 130  120 134 145 145 155 160  Shoulder extension 35         Shoulder abduction 105 104  110 120 120 150 155  Shoulder adduction            Shoulder internal rotation 80 shoulder abd 45 degrees    80 shoulder  abducted 45 degrees 78 shoulder  abducted 45 degrees 75 degrees (Shoulder abd 45 degrees) 75 deg Shoulder abd 45 deg  Shoulder external rotation 20 Shoulder abd 45 degrees 30 Shoulder abducted to 45 degrees   30 Shoulder abducted to 45 degrees 30 Shoulder abducted to 45 degrees 28 degrees Shoulder abd 45 degrees) 30 deg Shoulder abd 45 deg  Elbow flexion           Elbow extension           Wrist flexion           Wrist extension           Wrist ulnar deviation           Wrist radial deviation           Wrist pronation           Wrist supination               UPPER EXTREMITY MMT:   MMT Right 10/19/2021 Left 10/19/2021 Right 12/06/2021 Rt 12/28/2021  Shoulder flexion 2+/5 4+/5 3-/5 3/5  Shoulder extension 2+/5 4+/5 3-/5 3/5  Shoulder abduction 2+/5 4+/5 3-/5 3/5  Shoulder adduction        Shoulder internal rotation 2+/5 4+/5 3-/5 3/5  Shoulder external rotation 2+/5 4+/5 3-/5 3/5  Middle trapezius        Lower trapezius        Elbow flexion        Elbow extension        Wrist flexion        Wrist extension        Wrist ulnar deviation        Wrist radial deviation        Wrist pronation        Wrist supination        Grip strength (lbs) 31.8 pounds 48 pounds    29.8 pounds  (Blank rows = not tested)  TODAY'S TREATMENT:   02/07/2022:  TherEx: AA flexion c 1 # bar 2 x 10 to 90 degrees AA abduction c 1 # bar 2 x 10 to 70 degrees Bicep curls with 1 # weight x 10 Ball rolling up wall x 10 with bilateral UE's UE ranger scaption x 1 minute in sitting Rows: Level 2 band 2 x 10 ER: level 2 band elbow at 90 degrees walking sideways  UBE: Level 1.5 x 6 minutes (3 minutes each directions) Manual: PROM: Rt shoulder flexion, ER, abduction   02/07/2022:  TherEx:  Isometrics using yellow physioball x 10 (flexion, extension, ER and IR) holding 5 seconds each Sidelying ER with  minimal movements Bicep curls with 1 # weight x 10 Ball rolling up wall x 10 with bilateral UE's UE ranger scaption x 1 minute in sitting Pulleys scaption x 2 minutes Pulleys in abduction x 2 minutes Manual: PROM: Rt shoulder flexion, ER, abduction, scapular mobs, shoulder mobs Grade 2-3 GH AP and inferior mobs Modalities:  Moist heat: 10 minutes to Rt posterior shoulder during ER stretching in supine   01/10/2022:  TherEx:  UBE: Level 2.5 x 6 miutes (3 each direction) Ball rolling up wall x 10 with bilateral UE's UE ranger scaption x 1 minute in sitting Pulleys scaption x 2 minutes Pulleys in abduction x 2 minutes Manual: IASTM to Rt shoulder and Rt upper trap/levator and rhomboids Modalities:  Moist heat: 6 minutes to Rt shoulder     01/04/2022:   TherEx: UBE L2 x 6 minutes (3 minutes each direction)  UE ranger: flexion x 2 minutes           Scaption x 2 minutes          Circles clock wise and counter clock wise each x 20  Shoulder isometrics: 5 x 5 seconds (flexion, ER, IR, Extension)  Sit to stand: no UE support 14 seconds from standard arm chair    Manual:    STM to bilateral cervical paraspinals, bilateral upper trap, bilateral levator scapulae, bilateral shomboids. Scapular mobs performed on Rt UE.   PROM: Rt UE in all planes, Grade 2-3 GH inferior mobs and AP mobs performed         PATIENT EDUCATION: Education details:added isometrics to pt's HEP  Person educated: Patient Education method: Explanation Education comprehension: verbalized understanding, returned demonstration, and verbal cues required     HOME EXERCISE PROGRAM: Access Code: [email or web address] URL: [email or web address] Date: 10/19/2021 Prepared by: Jennifer Martin   Exercises Seated Shoulder Flexion Towel Slide at Table Top - 2-3 x daily - 7 x weekly - 1 sets - 10 reps - 5-10 seocnds hold Seated Shoulder External Rotation PROM on Table - 2-3 x daily - 7 x weekly - 1 sets - 10 reps  - 5-10 seconds hold Seated Scapular Retraction - 2-3 x daily - 7 x weekly - 1 sets - 10 reps - 5-10 seconds hold     ASSESSMENT:   CLINICAL IMPRESSION: 02/07/2022: Pt reporting today was a good day. Pt reporting no pain upon arrival. Pt stating she is now able to reach her left ear to put on her earring using her Rt UE. Pt able to demonstrate but still using compensatory strategies to perform the movement. Pt tolerating exercises well with increasd pain during end range of ER, flexion and abduction. Pt did report her "popping" has decreased some. Continue skilled PT to maximize pt's function.       REHAB POTENTIAL: fair, due to co-morbidities     CLINICAL DECISION MAKING: Evolving/moderate complexity   EVALUATION COMPLEXITY: Moderate     GOALS: Goals reviewed with patient? Yes   SHORT TERM GOALS:   STG Name Target Date Goal status  1 Pt will be independent with her initial HEP.    11/16/2021 Met 11/09/2021  2 Pt will be able to perform 5 time sit to stand with no UE support </= 15 seconds Baseline:  11/16/2021 MET  01/04/2022    LONG TERM GOALS:    LTG Name Target Date Goal status  1 PT will be independent in her advanced HEP.  Baseline: 01/11/2022 MET  01/04/22  2 Pt will improve FOTO score to >/= 63 % Baseline: 53% on 01/04/2022 04/08/22 Ongoing 01/04/22  3 Pt will improve her right shoulder ER to >/= 30 degrees to improve functional mobility.  Baseline: 04/08/22 Ongoing 02/15/22  4 Pt will be able to report pain of </= 2/10 with ADL's using her right UE.  Baseline: pain depends on the day 01/04/22 04/08/22 Partially Met  01/04/22       5.   Pt will be able to reach her left under arm for donning deodorant.  04/08/22  On-going 02/15/22                      PLAN: PT FREQUENCY: 1x/ week for 12 weeks: Recert sent on 10/15/5168 Approved for 12 visits from 01/04/2022 -  02/18/2022   PT DURATION: 12 weeks   PLANNED INTERVENTIONS: Therapeutic exercises, Therapeutic activity, Neuro  Muscular re-education, Balance training, Gait training, Patient/Family education, Joint mobilization, Dry Needling, Cryotherapy, Moist heat, Taping, Vasopneumatic device, and Manual therapy   PLAN FOR NEXT SESSION:  shoulder isometrics, shoulder ROM, mobs, PROM, gentle stretching and strengthening as tolerated      Oretha Caprice, PT, MPT 02/15/2022, 3:37 PM

## 2022-02-20 ENCOUNTER — Other Ambulatory Visit: Payer: Self-pay | Admitting: Oncology

## 2022-02-23 ENCOUNTER — Encounter: Payer: Self-pay | Admitting: *Deleted

## 2022-02-23 ENCOUNTER — Inpatient Hospital Stay (HOSPITAL_BASED_OUTPATIENT_CLINIC_OR_DEPARTMENT_OTHER): Payer: Medicare HMO | Admitting: Nurse Practitioner

## 2022-02-23 ENCOUNTER — Encounter: Payer: Self-pay | Admitting: Nurse Practitioner

## 2022-02-23 ENCOUNTER — Inpatient Hospital Stay: Payer: Medicare HMO

## 2022-02-23 ENCOUNTER — Inpatient Hospital Stay: Payer: Medicare HMO | Attending: Nurse Practitioner

## 2022-02-23 VITALS — BP 112/75 | HR 72 | Temp 97.9°F | Resp 18 | Wt 158.4 lb

## 2022-02-23 DIAGNOSIS — C772 Secondary and unspecified malignant neoplasm of intra-abdominal lymph nodes: Secondary | ICD-10-CM

## 2022-02-23 DIAGNOSIS — C770 Secondary and unspecified malignant neoplasm of lymph nodes of head, face and neck: Secondary | ICD-10-CM | POA: Diagnosis not present

## 2022-02-23 DIAGNOSIS — C210 Malignant neoplasm of anus, unspecified: Secondary | ICD-10-CM | POA: Insufficient documentation

## 2022-02-23 DIAGNOSIS — Z79899 Other long term (current) drug therapy: Secondary | ICD-10-CM | POA: Diagnosis not present

## 2022-02-23 DIAGNOSIS — Z5112 Encounter for antineoplastic immunotherapy: Secondary | ICD-10-CM | POA: Diagnosis not present

## 2022-02-23 LAB — CMP (CANCER CENTER ONLY)
ALT: 11 U/L (ref 0–44)
AST: 22 U/L (ref 15–41)
Albumin: 3.8 g/dL (ref 3.5–5.0)
Alkaline Phosphatase: 65 U/L (ref 38–126)
Anion gap: 11 (ref 5–15)
BUN: 20 mg/dL (ref 8–23)
CO2: 22 mmol/L (ref 22–32)
Calcium: 9.5 mg/dL (ref 8.9–10.3)
Chloride: 102 mmol/L (ref 98–111)
Creatinine: 1.02 mg/dL — ABNORMAL HIGH (ref 0.44–1.00)
GFR, Estimated: 58 mL/min — ABNORMAL LOW (ref >60)
Glucose, Bld: 82 mg/dL (ref 70–99)
Potassium: 5.2 mmol/L — ABNORMAL HIGH (ref 3.5–5.1)
Sodium: 135 mmol/L (ref 135–145)
Total Bilirubin: 0.4 mg/dL (ref 0.3–1.2)
Total Protein: 7.2 g/dL (ref 6.5–8.1)

## 2022-02-23 MED ORDER — SODIUM CHLORIDE 0.9 % IV SOLN: Freq: Once | INTRAVENOUS | Status: AC

## 2022-02-23 MED ORDER — SODIUM CHLORIDE 0.9 % IV SOLN
480.0000 mg | Freq: Once | INTRAVENOUS | Status: AC
  Administered 2022-02-23: 480 mg via INTRAVENOUS
  Filled 2022-02-23: qty 48

## 2022-02-23 NOTE — Progress Notes (Signed)
Patient seen by Ned Card NP today  Vitals are within treatment parameters.  Labs reviewed by Ned Card NP and are within treatment parameters. OK to treat w/K+ 5.2 Per Ned Card, NP: No CBC is needed today.  Per physician team, patient is ready for treatment and there are NO modifications to the treatment plan.

## 2022-02-23 NOTE — Progress Notes (Signed)
[place] OFFICE PROGRESS NOTE   Diagnosis: Anal cancer  INTERVAL HISTORY:   [name] returns as scheduled.  She completed another cycle of nivolumab 01/26/2022.  Bowel habits have returned to baseline, soft stools 2-3 times a day.  No rash.  No nausea or vomiting.  She reports having bone pain "head to toe" and a decreased energy level.  Objective:  Vital signs in last 24 hours:  Blood pressure 112/75, pulse 72, temperature 97.9 F (36.6 C), temperature source Tympanic, resp. rate 18, weight 158 lb 6.4 oz (71.8 kg), SpO2 100 %.    HEENT: No thrush or ulcers. Resp: End inspiratory rales lung base bilaterally.  No respiratory distress. Cardio: Regular rate and rhythm. GI: No hepatosplenomegaly. Vascular: Trace edema lower leg bilaterally. Skin: No rash.   Lab Results:  Lab Results  Component Value Date   WBC 4.1 12/30/2021   HGB 11.0 (L) 12/30/2021   HCT 35.1 (L) 12/30/2021   MCV 87.5 12/30/2021   PLT 221 12/30/2021   NEUTROABS 3.0 12/30/2021    Imaging:  No results found.  Medications: I have reviewed the patient's current medications.  Assessment/Plan: Anal cancer CT abdomen/pelvis 10/21/2016-thickening of the anus extending to the junction between the anus and rectum with a large stool ball in the rectum and mild fat stranding posterior to the rectum. Enlarged lymph nodes in the right and left inguinal regions. A few mildly prominent nodes seen posterior to the rectum. 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 PET scan [id number] hypermetabolic anal mass with hypermetabolic metastases to the groin region bilaterally, left pelvic sidewall and presacral space. Initiation of radiation and cycle 1 5-FU/mitomycin C 11/14/2016 Cycle 2 5-FU/mitomycin C 12/12/2016 (5-FU dose reduced due to mucositis, diarrhea, skin breakdown) Radiation completed 12/23/2016 CT abdomen/pelvis 04/14/2017-resolution of anal mass and  bilateral inguinal lymphadenopathy. No residual tumor seen. CT abdomen/pelvis 01/28/2020-right hydroureteronephrosis, transition at the level of the mid right ureter, retroperitoneal lymphadenopathy CT renal stone study 01/28/2020-severe right hydronephrosis and proximal right hydroureter, 8 mm area of increased attenuation in the proximal right ureter-stone versus soft tissue mass PET 02/13/2020-hypermetabolic right retroperitoneal nodal metastases in the aortocaval and posterior pericaval chains, nonspecific mild anal wall hypermetabolism, right nephroureteral stent 02/21/2020 anorectal exam per Dr. Ashley Jacobs radiation changes of the skin around the anal margin.  Posterior midline smooth scarring.  Mildly stenotic.  Stenosis seems better.  No palpable concerning lesions.  Entire anal canal and distal rectum feels smooth and healthy.  Partial anoscopy performed with no lesions in the anal canal. Xeloda/radiation beginning 03/02/2020, completed 04/13/2020 CT abdomen/pelvis 06/17/2020-resolution of right retroperitoneal lymphadenopathy, no remaining pathologically enlarged lymph nodes, residual mild right hydronephrosis, no evidence of progressive disease, stable anal wall thickening Digital rectal exam by Dr. Quentin Cornwall 08/26/2020-posterior midline smooth scarring.  Mildly stenotic.  Stenosis seems better.  No palpable concerning lesions.  Anal canal feels smooth without palpable concern.  Unable to tolerate anoscopy.  Next visit in 6 months for surveillance. CT abdomen/pelvis 10/28/2020-no evidence of local recurrence or metastatic disease.  Stable mild rectal wall thickening.  New diffuse bladder wall thickening possibly related to prior radiation CT abdomen/pelvis 01/16/2021- recurrent right-sided hydronephrosis and proximal right hydroureter status post removal of right ureter stent, new soft tissue nodule along the course of the right ureter between the right psoas and IVC suspicious for recurrent  lymphadenopathy PET 02/12/2021-small focus of hypermetabolism at the anorectal junction without a definable mass, right retroperitoneal nodal metastasis with obstruction of the proximal right  ureter, right middle lobe hypermetabolic nodule, hypermetabolism in the right hilum without a defined nodal mass, hypermetabolic left supraclavicular and left axillary nodes 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 Cycle 1 Taxol/carboplatin 03/11/2021 Cycle 2 Taxol/carboplatin 03/31/2021, Fulphila Cycle 3 Taxol/carboplatin 04/21/2021, Fulphila CTs 05/11/2021-decrease size of FDG avid nodule in right middle lobe, right middle lobe subpleural nodule slightly enlarged, decreased in size of left axillary and subpectoral nodes, unchanged FDG avid right iliac nodes Cycle 4 Taxol/carboplatin 05/12/2021, G-CSF discontinued secondary to poor tolerance, Taxol and carboplatin dose reduced Cycle 5 Taxol/carboplatin 06/02/2021 Cycle 6 carboplatin 07/01/2021, Taxol held due to neuropathy Cycle 7 carboplatin 07/29/2021, Taxol held due to neuropathy CTs 08/16/2021-new liver lesion, increase in right psoas lymph node, unchanged right hydronephrosis, new mediastinal and right hilar lymphadenopathy, enlargement of bilateral pulmonary nodules and at least one new nodule, new subcarinal node Cycle 1 nivolumab 08/25/2021 Cycle 2 nivolumab 09/08/2021 Cycle 3 nivolumab 09/22/2021 Cycle 4 nivolumab 10/06/2021 Cycle 5 nivolumab 10/20/2021 Cycle 6 nivolumab 11/04/2021 CTs 11/12/2021-resolution of lung nodules, decreased size of right psoas node, no remaining mediastinal lymphadenopathy, resolution of right liver lesion Cycle 7 nivolumab 11/17/2021 Cycle 8 nivolumab 12/01/2021 Cycle 9 nivolumab 12/15/2021 Cycle 10 nivolumab 12/30/2021 Cycle 11 nivolumab 01/12/2022 Cycle 12 nivolumab 5/10/228-monthy dosing Cycle 13 nivolumab 02/23/2022 Left labial lesions. Question direct extension from the anal  cancer versus metastatic disease from anal cancer versus a separate malignant process. 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 Multiple family members with breast cancer. History of hypokalemia-likely secondary to decreased nutritional intake and diarrhea; potassium in normal range 01/16/2017. No longer taking a potassium supplement. Right hydroureteronephrosis on CT 01/28/2020, status post a cystoscopy/pyelogram 01/28/2020 confirming extrinsic compression of the right ureter, status post stent placement Right ureter stent exchange 06/12/2020 Right ureter stent removed 01/13/2021   8.  Admission 01/07/2021 with Klebsiella pneumoniae urosepsis/bacteremia  9.  Hospital admission 05/15/2021 with UTI/possible right pyelonephritis        Disposition: Ms. KFelkerappears stable.  She completed cycle 12 nivolumab 01/26/2022.  Dose was escalated to the monthly dosing schedule.  She noted a significant decrease in her energy level and generalized bone pain after treatment.  Unclear if symptoms were related to the higher dose of nivolumab.  She would like to continue at the current dose/schedule for another treatment to see how she tolerates.  Plan to proceed with cycle 13 nivolumab monthly dosing today.  Restaging CTs prior to next office visit.  She will return for follow-up in 4 weeks.  We are available to see her sooner if needed.  Plan reviewed with Dr. SBenay Spice  LNed CardANP/GNP-BC   02/23/2022  11:34 AM

## 2022-02-23 NOTE — Patient Instructions (Signed)
[place]   Discharge Instructions: Thank you for choosing [facility] to provide your oncology and hematology care.   If you have a lab appointment with the [facility], please go directly to the [facility] and check in at the registration area.   Wear comfortable clothing and clothing appropriate for easy access to any Portacath or PICC line.   We strive to give you quality time with your provider. You may need to reschedule your appointment if you arrive late (15 or more minutes).  Arriving late affects you and other patients whose appointments are after yours.  Also, if you miss three or more appointments without notifying the office, you may be dismissed from the clinic at the provider's discretion.      For prescription refill requests, have your pharmacy contact our office and allow 72 hours for refills to be completed.    Today you received the following chemotherapy and/or immunotherapy agents Nivolumab (OPDIVO).      To help prevent nausea and vomiting after your treatment, we encourage you to take your nausea medication as directed.  BELOW ARE SYMPTOMS THAT SHOULD BE REPORTED IMMEDIATELY: *FEVER GREATER THAN 100.4 F (38 C) OR HIGHER *CHILLS OR SWEATING *NAUSEA AND VOMITING THAT IS NOT CONTROLLED WITH YOUR NAUSEA MEDICATION *UNUSUAL SHORTNESS OF BREATH *UNUSUAL BRUISING OR BLEEDING *URINARY PROBLEMS (pain or burning when urinating, or frequent urination) *BOWEL PROBLEMS (unusual diarrhea, constipation, pain near the anus) TENDERNESS IN MOUTH AND THROAT WITH OR WITHOUT PRESENCE OF ULCERS (sore throat, sores in mouth, or a toothache) UNUSUAL RASH, SWELLING OR PAIN  UNUSUAL VAGINAL DISCHARGE OR ITCHING   Items with * indicate a potential emergency and should be followed up as soon as possible or go to the Emergency Department if any problems should occur.  Please show the CHEMOTHERAPY ALERT CARD or IMMUNOTHERAPY ALERT CARD at  check-in to the Emergency Department and triage nurse.  Should you have questions after your visit or need to cancel or reschedule your appointment, please contact [facility]  Dept: [phone]  and follow the prompts.  Office hours are 8:00 a.m. to 4:30 p.m. Monday - Friday. Please note that voicemails left after 4:00 p.m. may not be returned until the following business day.  We are closed weekends and major holidays. You have access to a nurse at all times for urgent questions. Please call the main number to the clinic Dept: [phone] and follow the prompts.   For any non-urgent questions, you may also contact your provider using MyChart. We now offer e-Visits for anyone 62 and older to request care online for non-urgent symptoms. For details visit [email or web address].   Also download the MyChart app! Go to the app store, search "MyChart", open the app, select , and log in with your MyChart username and password.  Due to Covid, a mask is required upon entering the hospital/clinic. If you do not have a mask, one will be given to you upon arrival. For doctor visits, patients may have 1 support person aged 60 or older with them. For treatment visits, patients cannot have anyone with them due to current Covid guidelines and our immunocompromised population.   Nivolumab injection What is this medication? NIVOLUMAB (nye VOL ue mab) is a monoclonal antibody. It treats certain types of cancer. Some of the cancers treated are colon cancer, head and neck cancer, Hodgkin lymphoma, lung cancer, and melanoma. This medicine may be used for other purposes;  ask your health care provider or pharmacist if you have questions. COMMON BRAND NAME(S): Opdivo What should I tell my care team before I take this medication? They need to know if you have any of these conditions: Autoimmune diseases such as Crohn's disease, ulcerative colitis, or lupus Have had or planning to  have an allogeneic stem cell transplant (uses someone else's stem cells) History of chest radiation Organ transplant Nervous system problems such as myasthenia gravis or Guillain-Barre syndrome An unusual or allergic reaction to nivolumab, other medicines, foods, dyes, or preservatives Pregnant or trying to get pregnant Breast-feeding How should I use this medication? This medication is injected into a vein. It is given in a hospital or clinic setting. A special MedGuide will be given to you before each treatment. Be sure to read this information carefully each time. Talk to your care team regarding the use of this medication in children. While it may be prescribed for children as young as 12 years for selected conditions, precautions do apply. Overdosage: If you think you have taken too much of this medicine contact a poison control center or emergency room at once. NOTE: This medicine is only for you. Do not share this medicine with others. What if I miss a dose? Keep appointments for follow-up doses. It is important not to miss your dose. Call your care team if you are unable to keep an appointment. What may interact with this medication? Interactions have not been studied. This list may not describe all possible interactions. Give your health care provider a list of all the medicines, herbs, non-prescription drugs, or dietary supplements you use. Also tell them if you smoke, drink alcohol, or use illegal drugs. Some items may interact with your medicine. What should I watch for while using this medication? Your condition will be monitored carefully while you are receiving this medication. You may need blood work done while you are taking this medication. Do not become pregnant while taking this medication or for 5 months after stopping it. Women should inform their care team if they wish to become pregnant or think they might be pregnant. There is a potential for serious harm to an unborn  child. Talk to your care team for more information. Do not breast-feed an infant while taking this medication or for 5 months after stopping it. What side effects may I notice from receiving this medication? Side effects that you should report to your care team as soon as possible: Allergic reactions--skin rash, itching, hives, swelling of the face, lips, tongue, or throat Bloody or black, tar-like stools Change in vision Chest pain Diarrhea Dry cough, shortness of breath or trouble breathing Eye pain Fast or irregular heartbeat Fever, chills High blood sugar (hyperglycemia)--increased thirst or amount of urine, unusual weakness or fatigue, blurry vision High thyroid levels (hyperthyroidism)--fast or irregular heartbeat, weight loss, excessive sweating or sensitivity to heat, tremors or shaking, anxiety, nervousness, irregular menstrual cycle or spotting Kidney injury--decrease in the amount of urine, swelling of the ankles, hands, or feet Liver injury--right upper belly pain, loss of appetite, nausea, light-colored stool, dark yellow or brown urine, yellowing skin or eyes, unusual weakness or fatigue Low red blood cell count--unusual weakness or fatigue, dizziness, headache, trouble breathing Low thyroid levels (hypothyroidism)--unusual weakness or fatigue, increased sensitivity to cold, constipation, hair loss, dry skin, weight gain, feelings of depression Mood and behavior changes-confusion, change in sex drive or performance, irritability Muscle pain or cramps Pain, tingling, or numbness in the hands or feet, muscle  weakness, trouble walking, loss of balance or coordination Red or dark brown urine Redness, blistering, peeling, or loosening of the skin, including inside the mouth Stomach pain Unusual bruising or bleeding Side effects that usually do not require medical attention (report to your care team if they continue or are bothersome): Bone pain Constipation Loss of  appetite Nausea Tiredness Vomiting This list may not describe all possible side effects. Call your doctor for medical advice about side effects. You may report side effects to FDA at 1-800-FDA-1088. Where should I keep my medication? This medication is given in a hospital or clinic and will not be stored at home. NOTE: This sheet is a summary. It may not cover all possible information. If you have questions about this medicine, talk to your doctor, pharmacist, or health care provider.  2023 Elsevier/Gold Standard (2021-08-06 00:00:00)

## 2022-03-01 ENCOUNTER — Encounter: Payer: Medicare HMO | Admitting: Physical Therapy

## 2022-03-01 ENCOUNTER — Encounter: Payer: Self-pay | Admitting: Rehabilitative and Restorative Service Providers"

## 2022-03-01 ENCOUNTER — Ambulatory Visit: Payer: Medicare HMO | Admitting: Rehabilitative and Restorative Service Providers"

## 2022-03-01 DIAGNOSIS — G8929 Other chronic pain: Secondary | ICD-10-CM

## 2022-03-01 DIAGNOSIS — M25511 Pain in right shoulder: Secondary | ICD-10-CM | POA: Diagnosis not present

## 2022-03-01 DIAGNOSIS — R600 Localized edema: Secondary | ICD-10-CM | POA: Diagnosis not present

## 2022-03-01 DIAGNOSIS — M25611 Stiffness of right shoulder, not elsewhere classified: Secondary | ICD-10-CM

## 2022-03-01 DIAGNOSIS — M6281 Muscle weakness (generalized): Secondary | ICD-10-CM

## 2022-03-01 DIAGNOSIS — R293 Abnormal posture: Secondary | ICD-10-CM

## 2022-03-01 NOTE — Therapy (Signed)
OUTPATIENT PHYSICAL THERAPY TREATMENT NOTE        Patient Name: Ashley Moore MRN: [id number] DOB:Apr 15, 1951, 71 y.o., female Today's Date: 03/01/2022  PCP: [name], MD REFERRING PROVIDER: Persons, [name], [place]    PT End of Session - 03/01/22 1436     Visit Number 14    Number of Visits 22    Date for PT Re-Evaluation 04/08/22    Authorization Type HU[employer] 12 visits 4/18 - 03/11/2022    Authorization Time Period 4/18 - 03/11/2022    Authorization - Visit Number 5    Authorization - Number of Visits 12    Progress Note Due on Visit 20    PT Start Time 5615    PT Stop Time 1504    PT Time Calculation (min) 25 min    Activity Tolerance Patient limited by pain    Behavior During Therapy [facility] for tasks assessed/performed               PT End of Session - 03/01/22 1436     Visit Number 14    Number of Visits 22    Date for PT Re-Evaluation 04/08/22    Authorization Type HU[employer] 12 visits 4/18 - 03/11/2022    Authorization Time Period 4/18 - 03/11/2022    Authorization - Visit Number 5    Authorization - Number of Visits 12    Progress Note Due on Visit 20    PT Start Time 1439    PT Stop Time 1504    PT Time Calculation (min) 25 min    Activity Tolerance Patient limited by pain    Behavior During Therapy [facility] for tasks assessed/performed                     Past Medical History:  Diagnosis Date   Arthritis    Bowen's disease    excised 1992   Chronic low back pain    Chronic radiation cystitis    DDD (degenerative disc disease), cervical    Family history of breast cancer    Family history of lung cancer    Family history of non-Hodgkin's lymphoma    Family history of ovarian cancer    Headache    Hx of varicella    Hydronephrosis of right kidney    urologist--- dr Tresa Moore,  malignant,  treated with ureter stent   Hypothyroidism    followed by pcp   Lower urinary tract symptoms (LUTS)    01-12-2021  per pt treated with accupuncture at  dr Tresa Moore office   Lymphedema of both lower extremities    PICC (peripherally inserted central catheter) in place 01/11/2021   placed at [facility] in [place], [place] for IV antibiotics   Positive blood culture 01/08/2021   Klebsiella Pneumaniae,  treated with daily IV antibiotic   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[facility]) oncologist--- dr Benay Spice   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,  invasive SCC , completed chemo/ radiation 12-23-2016;  recurrent metastatsis retroperitoneal lymphdenopathy,  completed radiation 04-13-2020 residual right ureteral uropathy obstruction   Retroperitoneal lymphadenopathy    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   Sepsis due to Klebsiella pneumoniae [facility]) 01/07/2021   pt admitted to [facility] in [place],  dx sepsis secondary to acute pyelonephritis with bacteremia , positive blood culture,  discharged 01-11-2021 home daily IV antibiotic   Wears glasses    Past Surgical History:  Procedure Laterality Date   BUNIONECTOMY Right yrs  ago   [place] Right 06/12/2020   Procedure: CYSTOSCOPY WITH RETROGRADE PYELOGRAM/URETERAL STENT EXCHANGE;  Surgeon: Alexis Frock, MD;  Location: [facility];  Service: Urology;  Laterality: Right;   CYSTOSCOPY W/ URETERAL STENT PLACEMENT Right 01/13/2021   Procedure: CYSTOSCOPY WITH RETROGRADE PYELOGRAM/URETERAL STENT REMOVALRIGHT;  Surgeon: Alexis Frock, MD;  Location: [facility];  Service: Urology;  Laterality: Right;  [place], URETEROSCOPY AND STENT PLACEMENT Right 01/28/2020   Procedure: CYSTOSCOPY WITH RETROGRADE PYELOGRAM, URETEROSCOPY AND STENT PLACEMENT;  Surgeon: Alexis Frock, MD;  Location: [facility];  Service: Urology;  Laterality: Right;   IR GENERIC HISTORICAL  11/14/2016   IR US GUIDE VASC ACCESS RIGHT 11/14/2016 [facility]   IR GENERIC HISTORICAL  11/14/2016   IR FLUORO GUIDE CV LINE RIGHT 11/14/2016  [facility]   IR GENERIC HISTORICAL  12/12/2016   IR US GUIDE VASC ACCESS RIGHT 12/12/2016 [name], MD [facility]   IR GENERIC HISTORICAL  12/12/2016   IR FLUORO GUIDE CV LINE RIGHT 12/12/2016 [name], MD [facility]   SKIN CANCER EXCISION  1990   bowens disease   Patient Active Problem List   Diagnosis Date Noted   Primary osteoarthritis, 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/28/2021   Atypical chest pain 06/29/2021   Intractable abdominal pain 05/16/2021   Acute pyelonephritis 05/16/2021   Acute cystitis without hematuria 05/16/2021   Hydronephrosis of right kidney 05/16/2021   Obstructive uropathy 05/16/2021   Hypothyroidism 05/16/2021   Hyponatremia 05/16/2021   Obesity (BMI 30-39.9) 05/16/2021   Goals of care, counseling/discussion 03/01/2021   SIRS (systemic inflammatory response syndrome) ([place]) 01/13/2021   Pyelonephritis 01/13/2021   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   Genetic testing 01/27/2020   Family history of breast cancer    Family history of lung cancer    Family history of ovarian cancer    Family history of non-Hodgkin's lymphoma    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   Port catheter in place 11/21/2016   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   Anal cancer ([place]) 11/03/2016   Cystitis 05/06/2015   Degenerative cervical disc 04/08/2015   Trigger point of neck 03/26/2015   Pain in joint, ankle and foot 04/23/2013   Visit for preventive health examination 02/13/2013   Routine gynecological examination 02/13/2013   Family history of breast cancer in first degree relative 02/13/2013   Rash and nonspecific skin eruption 02/13/2013   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   VITAMIN D DEFICIENCY 06/25/2008   NECK PAIN 06/02/2008   FOOT PAIN 06/02/2008   Osteopenia 06/02/2008    REFERRING DIAG: M25.511,G89.29 (ICD-10-CM) - Chronic right shoulder pain   THERAPY DIAG:  Stiffness of right shoulder, not elsewhere classified  Chronic right  shoulder pain  Localized edema  Abnormal posture  Muscle weakness (generalized)  PERTINENT HISTORY: Arthritis Bowen's diseaseexcised 1992 Chronic low back pain Chronic radiation cystitis DDD (degenerative disc disease), cervical Family history of breast cancer Family history of lung cancer Family history of non-Hodgkin's lymphoma Family history of ovarian cancer Headache Hx of varicella Hydronephrosis of right kidney rectal cancer   PRECAUTIONS: Other: infusion therapy for Cancer , fall precuations   SUBJECTIVE:  Pt indicated 5/10 at worst today, constant.    PAIN:  Are you having pain? no NPRS scale: 5/10 Pain location: anterior shoulder and lateral Rt shoulder into elbow Pain orientation: Right  PAIN TYPE: aching Pain description: constant  Aggravating factors: reaching upward, lateral movements  Relieving factors: resting and heat    OBJECTIVE:      PATIENT SURVEYS:  FOTO: 51% on 10/19/2021 FOTO: 53% on 01/04/2022  UPPER EXTREMITY AROM/PROM:   Active ROM  In supine Right 10/19/2021 Left 10/19/2021 Rt  02/15/22 Rt 03/01/2022   Shoulder flexion 120 164 130 140  Shoulder extension 30 45   50    Shoulder abduction 98 160 125   Shoulder adduction        Shoulder internal rotation 75 shoulder abd 45 degrees 75 75 Shoulder abd 45 deg   Shoulder external rotation 15 Shoulder abd 45 degrees 80 10 Shoulder abd 45 deg   Elbow flexion 140   145 145   Elbow extension 0 0 0   Wrist flexion        Wrist extension        Wrist ulnar deviation        Wrist radial deviation        Wrist pronation        Wrist supination        (Blank rows = not tested)   Passive ROM In degrees Right 10/19/21 Right 11/09/21 Right  11/16/21 Right 12/06/21 Rt 12/13/21 Rt 01/04/22 supine Rt 02/15/22 supine  Shoulder flexion 130  120 134 145 145 155 160  Shoulder extension 35         Shoulder abduction 105 104  110 120 120 150 155  Shoulder adduction           Shoulder internal rotation 80  shoulder abd 45 degrees    80 shoulder  abducted 45 degrees 78 shoulder  abducted 45 degrees 75 degrees (Shoulder abd 45 degrees) 75 deg Shoulder abd 45 deg  Shoulder external rotation 20 Shoulder abd 45 degrees 30 Shoulder abducted to 45 degrees   30 Shoulder abducted to 45 degrees 30 Shoulder abducted to 45 degrees 28 degrees Shoulder abd 45 degrees) 30 deg Shoulder abd 45 deg  Elbow flexion           Elbow extension           Wrist flexion           Wrist extension           Wrist ulnar deviation           Wrist radial deviation           Wrist pronation           Wrist supination               UPPER EXTREMITY MMT:   MMT Right 10/19/2021 Left 10/19/2021 Right 12/06/2021 Rt 12/28/2021  Shoulder flexion 2+/5 4+/5 3-/5 3/5  Shoulder extension 2+/5 4+/5 3-/5 3/5  Shoulder abduction 2+/5 4+/5 3-/5 3/5  Shoulder adduction        Shoulder internal rotation 2+/5 4+/5 3-/5 3/5  Shoulder external rotation 2+/5 4+/5 3-/5 3/5  Middle trapezius        Lower trapezius        Elbow flexion        Elbow extension        Wrist flexion        Wrist extension        Wrist ulnar deviation        Wrist radial deviation        Wrist pronation        Wrist supination        Grip strength (lbs) 31.8 pounds 48 pounds  29.8 pounds  (Blank rows = not tested)                  TODAY'S TREATMENT:   03/01/2022 Therex:  Supine AAROM flexion 1 lb x 10  UBE Level 1.5 3 mins fwd, 3 mins back  Seated isometrics to tolerance 5 sec hold on /off for Rt shoulder flexion, Er, IR x 6 each way  Rows green band 20x seated   02/15/2022:  TherEx: AA flexion c 1 # bar 2 x 10 to 90 degrees AA abduction c 1 # bar 2 x 10 to 70 degrees Bicep curls with 1 # weight x 10 Ball rolling up wall x 10 with bilateral UE's UE ranger scaption x 1 minute in sitting Rows: Level 2 band 2 x 10 ER: level 2 band elbow at 90 degrees walking sideways  UBE: Level 1.5 x 6 minutes (3 minutes each  directions) Manual: PROM: Rt shoulder flexion, ER, abduction   02/07/2022:  TherEx:  Isometrics using yellow physioball x 10 (flexion, extension, ER and IR) holding 5 seconds each Sidelying ER with minimal movements Bicep curls with 1 # weight x 10 Ball rolling up wall x 10 with bilateral UE's UE ranger scaption x 1 minute in sitting Pulleys scaption x 2 minutes Pulleys in abduction x 2 minutes Manual: PROM: Rt shoulder flexion, ER, abduction, scapular mobs, shoulder mobs Grade 2-3 GH AP and inferior mobs Modalities:  Moist heat: 10 minutes to Rt posterior shoulder during ER stretching in supine   PATIENT EDUCATION: Education details:added isometrics to pt's HEP  Person educated: Patient Education method: Explanation Education comprehension: verbalized understanding, returned demonstration, and verbal cues required     HOME EXERCISE PROGRAM: Access Code: [email or web address] URL: [email or web address] Date: 10/19/2021 Prepared by: Kearney Hard   Exercises Seated Shoulder Flexion Towel Slide at Table Top - 2-3 x daily - 7 x weekly - 1 sets - 10 reps - 5-10 seocnds hold Seated Shoulder External Rotation PROM on Table - 2-3 x daily - 7 x weekly - 1 sets - 10 reps - 5-10 seconds hold Seated Scapular Retraction - 2-3 x daily - 7 x weekly - 1 sets - 10 reps - 5-10 seconds hold     ASSESSMENT:   CLINICAL IMPRESSION:  Pt continued to present c Rt shoulder pain c mobility and strength deficits which negatively impact functional activity.  Pt has self reported gains in some movement of self care/dressing.  Continued skilled PT services to aid general progression towards goals.  Fair tolerance overall to movement today.   Adjusted intervention today to accommodate symptoms and presentation.    REHAB POTENTIAL: fair, due to co-morbidities   CLINICAL DECISION MAKING: Evolving/moderate complexity   EVALUATION COMPLEXITY: Moderate     GOALS: Goals reviewed with patient?  Yes   SHORT TERM GOALS:   STG Name Target Date Goal status  1 Pt will be independent with her initial HEP.    11/16/2021 Met 11/09/2021  2 Pt will be able to perform 5 time sit to stand with no UE support </= 15 seconds Baseline:  11/16/2021 MET  01/04/2022    LONG TERM GOALS:    LTG Name Target Date Goal status  1 PT will be independent in her advanced HEP.  Baseline: 01/11/2022 MET  01/04/22  2 Pt will improve FOTO score to >/= 63 % Baseline: 53% on 01/04/2022 04/08/22 Ongoing 03/01/2022  3 Pt will improve her right shoulder ER to >/= 30 degrees to improve functional  mobility.  Baseline: 04/08/22 Ongoing 03/01/2022  4 Pt will be able to report pain of </= 2/10 with ADL's using her right UE.  Baseline: pain depends on the day 01/04/22 04/08/22 On going  03/01/2022       5.   Pt will be able to reach her left under arm for donning deodorant.  04/08/22  On-going 03/01/2022                      PLAN: PT FREQUENCY: 1x/ week for 12 weeks Approved for 12 visits from 01/04/2022 -  02/18/2022   PT DURATION: 12 weeks   PLANNED INTERVENTIONS: Therapeutic exercises, Therapeutic activity, Neuro Muscular re-education, Balance training, Gait training, Patient/Family education, Joint mobilization, Dry Needling, Cryotherapy, Moist heat, Taping, Vasopneumatic device, and Manual therapy   PLAN FOR NEXT SESSION:  AAROM/AROM gains as tolerated.    Scot Jun, PT, DPT, OCS, ATC 03/01/22  3:02 PM

## 2022-03-02 ENCOUNTER — Telehealth: Payer: Self-pay

## 2022-03-02 NOTE — Telephone Encounter (Signed)
Ashley Moore called in and stated after treatment she had diarrhea, fatigue, stiffness in the joint and a dry cough.I spoke with the patient, her diarrhea is gone. She think her dry cough is coming from her Spring allergies. The stiffness are coming from her broken arm. Patient denied any fever, pain, shortness of breath, or chill. I advise the patient I will relay the message to the provider and if she like any of the symptoms get worse to call back.

## 2022-03-03 DIAGNOSIS — N130 Hydronephrosis with ureteropelvic junction obstruction: Secondary | ICD-10-CM | POA: Diagnosis not present

## 2022-03-03 DIAGNOSIS — R3121 Asymptomatic microscopic hematuria: Secondary | ICD-10-CM | POA: Diagnosis not present

## 2022-03-07 ENCOUNTER — Telehealth: Payer: Self-pay | Admitting: Physical Therapy

## 2022-03-07 ENCOUNTER — Encounter: Payer: Medicare HMO | Admitting: Physical Therapy

## 2022-03-07 NOTE — Therapy (Deleted)
OUTPATIENT PHYSICAL THERAPY TREATMENT NOTE        Patient Name: Ashley Moore MRN: [id number] DOB:[birth year], 71 y.o., female Today's Date: 03/07/2022  PCP: [name], MD REFERRING PROVIDER: [name], MD                  Past Medical History:  Diagnosis Date   Arthritis    Bowen's disease    excised 1992   Chronic low back pain    Chronic radiation cystitis    DDD (degenerative disc disease), cervical    Family history of breast cancer    Family history of lung cancer    Family history of non-Hodgkin's lymphoma    Family history of ovarian cancer    Headache    Hx of varicella    Hydronephrosis of right kidney    urologist--- dr Tresa Moore,  malignant,  treated with ureter stent   Hypothyroidism    followed by pcp   Lower urinary tract symptoms (LUTS)    01-12-2021  per pt treated with accupuncture at dr Tresa Moore office   Lymphedema of both lower extremities    PICC (peripherally inserted central catheter) in place 01/11/2021   placed at [facility] in [place], [place] for IV antibiotics   Positive blood culture 01/08/2021   Klebsiella Pneumaniae,  treated with daily IV antibiotic   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[facility]) oncologist--- dr Benay Spice   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,  invasive SCC , completed chemo/ radiation 12-23-2016;  recurrent metastatsis retroperitoneal lymphdenopathy,  completed radiation 04-13-2020 residual right ureteral uropathy obstruction   Retroperitoneal lymphadenopathy    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   Sepsis due to Klebsiella pneumoniae ([place]) 01/07/2021   pt admitted to [facility] in [place],  dx sepsis secondary to acute pyelonephritis with bacteremia , positive blood culture,  discharged 01-11-2021 home daily IV antibiotic   Wears glasses    Past Surgical History:  Procedure Laterality Date   BUNIONECTOMY Right yrs ago   [place] Right 06/12/2020    Procedure: CYSTOSCOPY WITH RETROGRADE PYELOGRAM/URETERAL STENT EXCHANGE;  Surgeon: Alexis Frock, MD;  Location: [facility];  Service: Urology;  Laterality: Right;   CYSTOSCOPY W/ URETERAL STENT PLACEMENT Right 01/13/2021   Procedure: CYSTOSCOPY WITH RETROGRADE PYELOGRAM/URETERAL STENT REMOVALRIGHT;  Surgeon: Alexis Frock, MD;  Location: [facility];  Service: Urology;  Laterality: Right;  [place], URETEROSCOPY AND STENT PLACEMENT Right 01/28/2020   Procedure: CYSTOSCOPY WITH RETROGRADE PYELOGRAM, URETEROSCOPY AND STENT PLACEMENT;  Surgeon: Alexis Frock, MD;  Location: [facility];  Service: Urology;  Laterality: Right;   IR GENERIC HISTORICAL  11/14/2016   IR US GUIDE VASC ACCESS RIGHT 11/14/2016 [facility]   IR GENERIC HISTORICAL  11/14/2016   IR FLUORO GUIDE CV LINE RIGHT 11/14/2016 [facility]   IR GENERIC HISTORICAL  12/12/2016   IR US GUIDE VASC ACCESS RIGHT 12/12/2016 [name], MD [facility]   IR GENERIC HISTORICAL  12/12/2016   IR FLUORO GUIDE CV LINE RIGHT 12/12/2016 [name], MD [facility]   SKIN CANCER EXCISION  1990   bowens disease   Patient Active Problem List   Diagnosis Date Noted   Primary osteoarthritis, 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/28/2021   Atypical chest pain 06/29/2021   Intractable abdominal pain 05/16/2021   Acute pyelonephritis 05/16/2021   Acute cystitis without hematuria 05/16/2021   Hydronephrosis of right kidney 05/16/2021   Obstructive uropathy  05/16/2021   Hypothyroidism 05/16/2021   Hyponatremia 05/16/2021   Obesity (BMI 30-39.9) 05/16/2021   Goals of care, counseling/discussion 03/01/2021   SIRS (systemic inflammatory response syndrome) ([place]) 01/13/2021   Pyelonephritis 01/13/2021   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   Genetic testing 01/27/2020   Family history of breast cancer    Family history of lung cancer    Family history of ovarian cancer    Family  history of non-Hodgkin's lymphoma    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   Port catheter in place 11/21/2016   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   Anal cancer ([place]) 11/03/2016   Cystitis 05/06/2015   Degenerative cervical disc 04/08/2015   Trigger point of neck 03/26/2015   Pain in joint, ankle and foot 04/23/2013   Visit for preventive health examination 02/13/2013   Routine gynecological examination 02/13/2013   Family history of breast cancer in first degree relative 02/13/2013   Rash and nonspecific skin eruption 02/13/2013   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   VITAMIN D DEFICIENCY 06/25/2008   NECK PAIN 06/02/2008   FOOT PAIN 06/02/2008   Osteopenia 06/02/2008    REFERRING DIAG: M25.511,G89.29 (ICD-10-CM) - Chronic right shoulder pain   THERAPY DIAG:  No diagnosis found.  PERTINENT HISTORY: Arthritis Bowen's diseaseexcised 1992 Chronic low back pain Chronic radiation cystitis DDD (degenerative disc disease), cervical Family history of breast cancer Family history of lung cancer Family history of non-Hodgkin's lymphoma Family history of ovarian cancer Headache Hx of varicella Hydronephrosis of right kidney rectal cancer   PRECAUTIONS: Other: infusion therapy for Cancer , fall precuations   SUBJECTIVE:  Pt indicated 5/10 at worst today, constant.    PAIN:  Are you having pain? no NPRS scale: 5/10 Pain location: anterior shoulder and lateral Rt shoulder into elbow Pain orientation: Right  PAIN TYPE: aching Pain description: constant  Aggravating factors: reaching upward, lateral movements Relieving factors: resting and heat    OBJECTIVE:      PATIENT SURVEYS:  FOTO: 51% on 10/19/2021 FOTO: 53% on 01/04/2022 FOTO:   UPPER EXTREMITY AROM/PROM:   Active ROM  In supine Right 10/19/2021 Left 10/19/2021 Rt  02/15/22 Rt 03/01/2022   Shoulder flexion 120 164 130 140  Shoulder extension 30 45   50    Shoulder abduction 98 160 125   Shoulder adduction         Shoulder internal rotation 75 shoulder abd 45 degrees 75 75 Shoulder abd 45 deg   Shoulder external rotation 15 Shoulder abd 45 degrees 80 10 Shoulder abd 45 deg   Elbow flexion 140   145 145   Elbow extension 0 0 0   Wrist flexion        Wrist extension        Wrist ulnar deviation        Wrist radial deviation        Wrist pronation        Wrist supination        (Blank rows = not tested)   Passive ROM In degrees Right 10/19/21 Right 11/09/21 Right  11/16/21 Right 12/06/21 Rt 12/13/21 Rt 01/04/22 supine Rt 02/15/22 supine  Shoulder flexion 130  120 134 145 145 155 160  Shoulder extension 35         Shoulder abduction 105 104  110 120 120 150 155  Shoulder adduction           Shoulder internal rotation 80 shoulder abd 45 degrees    80 shoulder  abducted 45 degrees 78 shoulder  abducted 45 degrees 75 degrees (Shoulder abd 45 degrees) 75 deg Shoulder abd 45 deg  Shoulder external rotation 20 Shoulder abd 45 degrees 30 Shoulder abducted to 45 degrees   30 Shoulder abducted to 45 degrees 30 Shoulder abducted to 45 degrees 28 degrees Shoulder abd 45 degrees) 30 deg Shoulder abd 45 deg  Elbow flexion           Elbow extension           Wrist flexion           Wrist extension           Wrist ulnar deviation           Wrist radial deviation           Wrist pronation           Wrist supination               UPPER EXTREMITY MMT:   MMT Right 10/19/2021 Left 10/19/2021 Right 12/06/2021 Rt 12/28/2021 Rt 03/07/22  Shoulder flexion 2+/5 4+/5 3-/5 3/5 3+/5  Shoulder extension 2+/5 4+/5 3-/5 3/5 3+/5  Shoulder abduction 2+/5 4+/5 3-/5 3/5 3+/5  Shoulder adduction         Shoulder internal rotation 2+/5 4+/5 3-/5 3/5 3+/5  Shoulder external rotation 2+/5 4+/5 3-/5 3/5 3+/5  Middle trapezius         Lower trapezius         Elbow flexion         Elbow extension         Wrist flexion         Wrist extension         Wrist ulnar deviation         Wrist radial deviation          Wrist pronation         Wrist supination         Grip strength (lbs) 31.8 pounds 48 pounds    29.8 pounds   (Blank rows = not tested)                  TODAY'S TREATMENT:    03/07/2022:  TherEx: AA flexion c 1 # bar 2 x 10 to 90 degrees AA abduction c 1 # bar 2 x 10 to 70 degrees Bicep curls with 3 # weight x 10 Ball rolling up wall x 10 with bilateral UE's UE ranger scaption x 1 minute in sitting Rows: Level 2 band 2 x 10 ER: level 2 band elbow at 90 degrees walking sideways   Manual: PROM: Rt shoulder flexion, ER, abduction  03/01/2022 Therex:  Supine AAROM flexion 1 lb x 10  UBE Level 1.5 3 mins fwd, 3 mins back  Seated isometrics to tolerance 5 sec hold on /off for Rt shoulder flexion, Er, IR x 6 each way  Rows green band 20x seated   02/15/2022:  TherEx: AA flexion c 1 # bar 2 x 10 to 90 degrees AA abduction c 1 # bar 2 x 10 to 70 degrees Bicep curls with 1 # weight x 10 Ball rolling up wall x 10 with bilateral UE's UE ranger scaption x 1 minute in sitting Rows: Level 2 band 2 x 10 ER: level 2 band elbow at 90 degrees walking sideways  UBE: Level 1.5 x 6 minutes (3 minutes each directions) Manual: PROM: Rt shoulder flexion, ER, abduction      PATIENT EDUCATION: Education  details:added isometrics to pt's HEP  Person educated: Patient Education method: Explanation Education comprehension: verbalized understanding, returned demonstration, and verbal cues required     HOME EXERCISE PROGRAM: Access Code: [email or web address] URL: [email or web address] Date: 10/19/2021 Prepared by: Kearney Hard   Exercises Seated Shoulder Flexion Towel Slide at Table Top - 2-3 x daily - 7 x weekly - 1 sets - 10 reps - 5-10 seocnds hold Seated Shoulder External Rotation PROM on Table - 2-3 x daily - 7 x weekly - 1 sets - 10 reps - 5-10 seconds hold Seated Scapular Retraction - 2-3 x daily - 7 x weekly - 1 sets - 10 reps - 5-10 seconds hold     ASSESSMENT:    CLINICAL IMPRESSION:    REHAB POTENTIAL: fair, due to co-morbidities   CLINICAL DECISION MAKING: Evolving/moderate complexity   EVALUATION COMPLEXITY: Moderate     GOALS: Goals reviewed with patient? Yes   SHORT TERM GOALS:   STG Name Target Date Goal status  1 Pt will be independent with her initial HEP.    11/16/2021 Met 11/09/2021  2 Pt will be able to perform 5 time sit to stand with no UE support </= 15 seconds Baseline:  11/16/2021 MET  01/04/2022    LONG TERM GOALS:    LTG Name Target Date Goal status  1 PT will be independent in her advanced HEP.  Baseline: 01/11/2022 MET  01/04/22  2 Pt will improve FOTO score to >/= 63 % Baseline: 53% on 01/04/2022 04/08/22 Ongoing 03/07/2022  3 Pt will improve her right shoulder ER to >/= 30 degrees to improve functional mobility.  Baseline: 04/08/22 Ongoing 03/07/2022  4 Pt will be able to report pain of </= 2/10 with ADL's using her right UE.  Baseline: pain depends on the day 01/04/22 04/08/22 On going  03/07/2022       5.   Pt will be able to reach her left under arm for donning deodorant.  04/08/22  On-going 03/07/2022                      PLAN: PT FREQUENCY: 1x/ week for 12 weeks Approved for 12 visits from 01/04/2022 -  03/11/2022    PT DURATION: 12 weeks   PLANNED INTERVENTIONS: Therapeutic exercises, Therapeutic activity, Neuro Muscular re-education, Balance training, Gait training, Patient/Family education, Joint mobilization, Dry Needling, Cryotherapy, Moist heat, Taping, Vasopneumatic device, and Manual therapy   PLAN FOR NEXT SESSION:  AAROM/AROM gains as tolerated.    Kearney Hard, PT, MPT 03/07/22 2:47 PM

## 2022-03-07 NOTE — Telephone Encounter (Signed)
I called pt to follow up after her missed visit today at 3:15 pm. Pt stated she forgot. I reminded her of her upcomming visit next Monday 03/13/2022 at 3:15 pm.   Kearney Hard, PT, MPT 03/07/22 3:37 PM

## 2022-03-14 ENCOUNTER — Ambulatory Visit: Payer: Medicare HMO | Admitting: Physical Therapy

## 2022-03-14 ENCOUNTER — Encounter: Payer: Self-pay | Admitting: Physical Therapy

## 2022-03-14 DIAGNOSIS — M25511 Pain in right shoulder: Secondary | ICD-10-CM

## 2022-03-14 DIAGNOSIS — R293 Abnormal posture: Secondary | ICD-10-CM | POA: Diagnosis not present

## 2022-03-14 DIAGNOSIS — G8929 Other chronic pain: Secondary | ICD-10-CM

## 2022-03-14 DIAGNOSIS — R600 Localized edema: Secondary | ICD-10-CM | POA: Diagnosis not present

## 2022-03-14 DIAGNOSIS — M6281 Muscle weakness (generalized): Secondary | ICD-10-CM

## 2022-03-14 DIAGNOSIS — M25611 Stiffness of right shoulder, not elsewhere classified: Secondary | ICD-10-CM | POA: Diagnosis not present

## 2022-03-14 NOTE — Therapy (Addendum)
OUTPATIENT PHYSICAL THERAPY TREATMENT NOTE Recert        Patient Name: DENISS WORMLEY MRN: [id number] DOB:1951/01/23, 71 y.o., female Today's Date: 03/14/2022  PCP: [name], MD REFERRING PROVIDER: Persons, [name], [place]    PT End of Session - 03/14/22 1517     Visit Number 15    Number of Visits 22    Date for PT Re-Evaluation 04/08/22    Authorization Type HU[employer] 12 visits 4/18 - 03/11/2022    Authorization Time Period 12 visits 4/18 - 03/11/2022, resubmitted on 03/14/22 for 12 more visits.    Authorization - Visit Number 6    Authorization - Number of Visits 12    Progress Note Due on Visit 20    PT Start Time 9935    PT Stop Time 1600    PT Time Calculation (min) 45 min    Activity Tolerance Patient limited by pain    Behavior During Therapy [facility] for tasks assessed/performed                PT End of Session - 03/14/22 1517     Visit Number 15    Number of Visits 22    Date for PT Re-Evaluation 04/08/22    Authorization Type HU[employer] 12 visits 4/18 - 03/11/2022    Authorization Time Period 12 visits 4/18 - 03/11/2022, resubmitted on 03/14/22 for 12 more visits.    Authorization - Visit Number 6    Authorization - Number of Visits 12    Progress Note Due on Visit 43    PT Start Time 7017    PT Stop Time 1600    PT Time Calculation (min) 45 min    Activity Tolerance Patient limited by pain    Behavior During Therapy WFL for tasks assessed/performed                      Past Medical History:  Diagnosis Date   Arthritis    Bowen's disease    excised 1992   Chronic low back pain    Chronic radiation cystitis    DDD (degenerative disc disease), cervical    Family history of breast cancer    Family history of lung cancer    Family history of non-Hodgkin's lymphoma    Family history of ovarian cancer    Headache    Hx of varicella    Hydronephrosis of right kidney    urologist--- dr Tresa Moore,  malignant,  treated with ureter stent    Hypothyroidism    followed by pcp   Lower urinary tract symptoms (LUTS)    01-12-2021  per pt treated with accupuncture at dr Tresa Moore office   Lymphedema of both lower extremities    PICC (peripherally inserted central catheter) in place 01/11/2021   placed at [facility] in [place], [place] for IV antibiotics   Positive blood culture 01/08/2021   Klebsiella Pneumaniae,  treated with daily IV antibiotic   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[facility] oncologist--- dr Benay Spice   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,  invasive SCC , completed chemo/ radiation 12-23-2016;  recurrent metastatsis retroperitoneal lymphdenopathy,  completed radiation 04-13-2020 residual right ureteral uropathy obstruction   Retroperitoneal lymphadenopathy    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   Sepsis due to Klebsiella pneumoniae ([place]) 01/07/2021   pt admitted to [facility] in [place],  dx sepsis secondary to acute pyelonephritis with bacteremia , positive blood culture,  discharged 01-11-2021 home daily  IV antibiotic   Wears glasses    Past Surgical History:  Procedure Laterality Date   BUNIONECTOMY Right yrs ago   [place] Right 06/12/2020   Procedure: CYSTOSCOPY WITH RETROGRADE PYELOGRAM/URETERAL STENT EXCHANGE;  Surgeon: Alexis Frock, MD;  Location: [facility];  Service: Urology;  Laterality: Right;   CYSTOSCOPY W/ URETERAL STENT PLACEMENT Right 01/13/2021   Procedure: CYSTOSCOPY WITH RETROGRADE PYELOGRAM/URETERAL STENT REMOVALRIGHT;  Surgeon: Alexis Frock, MD;  Location: [facility];  Service: Urology;  Laterality: Right;  [place], URETEROSCOPY AND STENT PLACEMENT Right 01/28/2020   Procedure: CYSTOSCOPY WITH RETROGRADE PYELOGRAM, URETEROSCOPY AND STENT PLACEMENT;  Surgeon: Alexis Frock, MD;  Location: [facility];  Service: Urology;  Laterality: Right;   IR GENERIC HISTORICAL  11/14/2016   IR US GUIDE  VASC ACCESS RIGHT 11/14/2016 [facility]   IR GENERIC HISTORICAL  11/14/2016   IR FLUORO GUIDE CV LINE RIGHT 11/14/2016 [facility]   IR GENERIC HISTORICAL  12/12/2016   IR US GUIDE VASC ACCESS RIGHT 12/12/2016 [name], MD [facility]   IR GENERIC HISTORICAL  12/12/2016   IR FLUORO GUIDE CV LINE RIGHT 12/12/2016 [name], MD [facility]   SKIN CANCER EXCISION  1990   bowens disease   Patient Active Problem List   Diagnosis Date Noted   Primary osteoarthritis, 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/28/2021   Atypical chest pain 06/29/2021   Intractable abdominal pain 05/16/2021   Acute pyelonephritis 05/16/2021   Acute cystitis without hematuria 05/16/2021   Hydronephrosis of right kidney 05/16/2021   Obstructive uropathy 05/16/2021   Hypothyroidism 05/16/2021   Hyponatremia 05/16/2021   Obesity (BMI 30-39.9) 05/16/2021   Goals of care, counseling/discussion 03/01/2021   SIRS (systemic inflammatory response syndrome) ([place]) 01/13/2021   Pyelonephritis 01/13/2021   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   Genetic testing 01/27/2020   Family history of breast cancer    Family history of lung cancer    Family history of ovarian cancer    Family history of non-Hodgkin's lymphoma    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   Port catheter in place 11/21/2016   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   Anal cancer ([place]) 11/03/2016   Cystitis 05/06/2015   Degenerative cervical disc 04/08/2015   Trigger point of neck 03/26/2015   Pain in joint, ankle and foot 04/23/2013   Visit for preventive health examination 02/13/2013   Routine gynecological examination 02/13/2013   Family history of breast cancer in first degree relative 02/13/2013   Rash and nonspecific skin eruption 02/13/2013   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   VITAMIN D DEFICIENCY 06/25/2008   NECK PAIN 06/02/2008   FOOT PAIN 06/02/2008   Osteopenia 06/02/2008    REFERRING DIAG: M25.511,G89.29 (ICD-10-CM) -  Chronic right shoulder pain   THERAPY DIAG:  Stiffness of right shoulder, not elsewhere classified  Chronic right shoulder pain  Localized edema  Abnormal posture  Muscle weakness (generalized)  PERTINENT HISTORY: Arthritis Bowen's diseaseexcised 1992 Chronic low back pain Chronic radiation cystitis DDD (degenerative disc disease), cervical Family history of breast cancer Family history of lung cancer Family history of non-Hodgkin's lymphoma Family history of ovarian cancer Headache Hx of varicella Hydronephrosis of right kidney rectal cancer   PRECAUTIONS: Other: infusion therapy for Cancer , fall precuations   SUBJECTIVE:  Pt stating her pain at rest is 0-1/10 and pain with reaching upward is 5/10. Pt stating her sleeping has improved. Pt did however report a  fall while trying to prevent to her cat from eating her other cat's food. Pt stating she fell onto a fluffy cat bed on her Rt side.   PAIN:  Are you having pain? No at rest NPRS scale: 5/10 with movements and trying to reach face Pain location: anterior shoulder and lateral Rt shoulder into elbow Pain orientation: Right  PAIN TYPE: aching Pain description: constant  Aggravating factors: reaching upward, lateral movements Relieving factors: resting and heat    OBJECTIVE:      PATIENT SURVEYS:  FOTO: 51% on 10/19/2021 FOTO: 53% on 01/04/2022 FOTO: 54.3% 03/14/2022  UPPER EXTREMITY AROM/PROM:   Active ROM  In supine Right 10/19/2021 Left 10/19/2021 Rt  02/15/22 Rt 03/01/2022  Rt 03/14/22  Shoulder flexion 120 164 130 140 142  Shoulder extension 30 45   50     Shoulder abduction 98 160 125  130  Shoulder adduction         Shoulder internal rotation 75 shoulder abd 45 degrees 75 75 Shoulder abd 45 deg  75  Shoulder abd 45 deg  Shoulder external rotation 15 Shoulder abd 45 degrees 80 10 Shoulder abd 45 deg  0 Shoulder abd 45 deg  Elbow flexion 140   145 145    Elbow extension 0 0 0    Wrist flexion          Wrist extension         Wrist ulnar deviation         Wrist radial deviation         Wrist pronation         Wrist supination         (Blank rows = not tested)   Passive ROM In degrees Right 10/19/21 Right 11/09/21 Right  11/16/21 Right 12/06/21 Rt 12/13/21 Rt 01/04/22 supine Rt 02/15/22 supine Rt 03/14/22  Shoulder flexion 130  120 134 145 145 155 160   Shoulder extension 35          Shoulder abduction 105 104  110 120 120 150 155   Shoulder adduction            Shoulder internal rotation 80 shoulder abd 45 degrees    80 shoulder  abducted 45 degrees 78 shoulder  abducted 45 degrees 75 degrees (Shoulder abd 45 degrees) 75 deg Shoulder abd 45 deg 75 Shoulder abd 45 deg  Shoulder external rotation 20 Shoulder abd 45 degrees 30 Shoulder abducted to 45 degrees   30 Shoulder abducted to 45 degrees 30 Shoulder abducted to 45 degrees 28 degrees Shoulder abd 45 degrees) 30 deg Shoulder abd 45 deg 10  Shoulder abd 45 deg  Elbow flexion            Elbow extension            Wrist flexion            Wrist extension            Wrist ulnar deviation            Wrist radial deviation            Wrist pronation            Wrist supination                UPPER EXTREMITY MMT:   MMT Right 10/19/2021 Left 10/19/2021 Right 12/06/2021 Rt 12/28/2021 Rt 03/07/22 Rt 03/14/2022  Shoulder flexion 2+/5 4+/5 3-/5 3/5 3+/5 3+/5  Shoulder extension 2+/5 4+/5 3-/5 3/5  3+/5 3+/5  Shoulder abduction 2+/5 4+/5 3-/5 3/5 3+/5 3+/5  Shoulder adduction          Shoulder internal rotation 2+/5 4+/5 3-/5 3/5 3+/5 3+/5  Shoulder external rotation 2+/5 4+/5 3-/5 3/5 3+/5 2+/5 tested at 0 deg  Middle trapezius          Lower trapezius          Elbow flexion          Elbow extension          Wrist flexion          Wrist extension          Wrist ulnar deviation          Wrist radial deviation          Wrist pronation          Wrist supination          Grip strength (lbs) 31.8 pounds 48 pounds     29.8 pounds  38.2 pounds  (Blank rows = not tested)                  TODAY'S TREATMENT:   03/14/2022:  TherEx: AA flexion c 1 # bar 2 x 10 to 90 degrees AA abduction c 1 # bar 2 x 10 to 70 degrees Bicep curls with 3 # weight x 10 Supine AAROM ER 2 x 10 Sidelying Abd: x 10 UE ranger scaption x 1 minute in sitting Rows: Level 2 band 2 x 10 ER: level 2 band elbow at 90 degrees walking sideways   Manual: PROM: Rt shoulder flexion, ER, abduction    03/07/2022:  TherEx: AA flexion c 1 # bar 2 x 10 to 90 degrees AA abduction c 1 # bar 2 x 10 to 70 degrees Bicep curls with 3 # weight x 10 Ball rolling up wall x 10 with bilateral UE's UE ranger scaption x 1 minute in sitting Rows: Level 2 band 2 x 10 ER: level 2 band elbow at 90 degrees walking sideways   Manual: PROM: Rt shoulder flexion, ER, abduction  03/01/2022 Therex:  Supine AAROM flexion 1 lb x 10  UBE Level 1.5 3 mins fwd, 3 mins back  Seated isometrics to tolerance 5 sec hold on /off for Rt shoulder flexion, Er, IR x 6 each way  Rows green band 20x seated   02/15/2022:  TherEx: AA flexion c 1 # bar 2 x 10 to 90 degrees AA abduction c 1 # bar 2 x 10 to 70 degrees Bicep curls with 1 # weight x 10 Ball rolling up wall x 10 with bilateral UE's UE ranger scaption x 1 minute in sitting Rows: Level 2 band 2 x 10 ER: level 2 band elbow at 90 degrees walking sideways  UBE: Level 1.5 x 6 minutes (3 minutes each directions) Manual: PROM: Rt shoulder flexion, ER, abduction      PATIENT EDUCATION: Education details:added isometrics to pt's HEP  Person educated: Patient Education method: Explanation Education comprehension: verbalized understanding, returned demonstration, and verbal cues required     HOME EXERCISE PROGRAM: Access Code: [email or web address] URL: [email or web address] Date: 10/19/2021 Prepared by: Kearney Hard   Exercises Seated Shoulder Flexion Towel Slide at Table Top - 2-3 x daily - 7 x  weekly - 1 sets - 10 reps - 5-10 seocnds hold Seated Shoulder External Rotation PROM on Table - 2-3 x daily - 7 x weekly - 1 sets - 10 reps - 5-10 seconds  hold Seated Scapular Retraction - 2-3 x daily - 7 x weekly - 1 sets - 10 reps - 5-10 seconds hold     ASSESSMENT:   CLINICAL IMPRESSION: Pt with a mild decline in her Rt ER acitve and passive motion due to recent fall last Wednesday at home. Pt's flexion and Abduction have improved along with pt's Rt grip strength. Pt also reporting decline in her ADL's since the fall and not being able to attend last weeks therapy session.  Pt's goal is to be able to wash her hair, put on deodorant, and perform basic ADL's. Due to pt's CA treatments pt has only been abel to attend 6 of her 12 visits requested in April. I am requesting 12 additional visits to progress pt as tolerated and maintain her current level of function.     REHAB POTENTIAL: fair, due to co-morbidities   CLINICAL DECISION MAKING: Evolving/moderate complexity   EVALUATION COMPLEXITY: Moderate     GOALS: Goals reviewed with patient? Yes   SHORT TERM GOALS:   STG Name Target Date Goal status  1 Pt will be independent with her initial HEP.    11/16/2021 Met 11/09/2021  2 Pt will be able to perform 5 time sit to stand with no UE support </= 15 seconds Baseline:  11/16/2021 MET  01/04/2022    LONG TERM GOALS:    LTG Name Target Date Goal status  1 PT will be independent in her advanced HEP.  Baseline: 01/11/2022 MET  01/04/22  2 Pt will improve FOTO score to >/= 63 % Baseline: 53% on 01/04/2022 06/14/22 Ongoing 03/14/2022  3 Pt will improve her right shoulder ER to >/= 30 degrees to improve functional mobility.  Baseline: 06/14/22 Ongoing 03/14/2022  4 Pt will be able to report pain of </= 2/10 with ADL's using her right UE.  Baseline: pain depends on the day 01/04/22 06/14/22 On going  03/14/2022       5.   Pt will be able to reach her left under arm for donning deodorant.   06/14/22  On-going 03/14/2022                      PLAN: PT FREQUENCY: 1x/ week for 12 weeks Approved for 12 visits from 01/04/2022 -  03/11/2022     PT DURATION: 12 weeks   PLANNED INTERVENTIONS: Therapeutic exercises, Therapeutic activity, Neuro Muscular re-education, Balance training, Gait training, Patient/Family education, Joint mobilization, Dry Needling, Cryotherapy, Moist heat, Taping, Vasopneumatic device, and Manual therapy   PLAN FOR NEXT SESSION:  AAROM/AROM gains as tolerated.    Kearney Hard, PT, MPT 03/14/22 3:25 PM   Referring diagnosis? M25.511,G89.29 (ICD-10-CM) - Chronic right shoulder pain Treatment diagnosis? (if different than referring diagnosis) M25.611, M25.511, M62.81, R60.0, R29.3 What was this (referring dx) caused by? $RemoveBe'[]'IQkKFtFgS$  Surgery $Remove'[]'UnNHEpQ$  Fall $Rem'[x]'TLQY$  Ongoing issue $RemoveBefor'[]'dHpLkgSCEIRr$  Arthritis $RemoveBe'[]'whVtPNFkU$  Other: ____________  Laterality: $RemoveBefo'[]'HAerylyDULr$  Rt $R'[]'el$  Lt $R'[]'oQ$  Both  Check all possible CPT codes:  *CHOOSE 10 OR LESS*    '[x]'$  97110 (Therapeutic Exercise)  $RemoveBe'[]'wdlgKpjZV$  92507 (SLP Treatment)  $RemoveBef'[x]'BURwfBlmkQ$  97112 (Neuro Re-ed)   '[]'$  92526 (Swallowing Treatment)   '[]'$  97116 (Gait Training)   '[]'$  [id number] (Cognitive Training, 1st 15 minutes) $RemoveBef'[x]'PmEsJntXnt$  97140 (Manual Therapy)   '[]'$  97130 (Cognitive Training, each add'l 15 minutes)  $RemoveB'[]'QMwQLzyq$  97164 (Re-evaluation)                              '[]'$   Other, List CPT Code ____________  [x]  80881 (Therapeutic Activities)     []  10315 (Self Care)   []  All codes above (97110 - 97535)  []  97012 (Mechanical Traction)  []  97014 (E-stim Unattended)  []  97032 (E-stim manual)  []  97033 (Ionto)  []  97035 (Ultrasound) []  97750 (Physical Performance Training) []  [id number] (Aquatic Therapy) []  97016 (Vasopneumatic Device) []  [id number] (Paraffin) []  97034 (Contrast Bath) []  97597 (Wound Care 1st 20 sq cm) []  97598 (Wound Care each add'l 20 sq cm) []  97760 (Orthotic Fabrication, Fitting, Training Initial) []  [id number] (Prosthetic Management and Training Initial) []  [id number] (Orthotic or Prosthetic Training/  Modification Subsequent)

## 2022-03-16 ENCOUNTER — Encounter (HOSPITAL_BASED_OUTPATIENT_CLINIC_OR_DEPARTMENT_OTHER): Payer: Self-pay

## 2022-03-16 ENCOUNTER — Ambulatory Visit (HOSPITAL_BASED_OUTPATIENT_CLINIC_OR_DEPARTMENT_OTHER)
Admission: RE | Admit: 2022-03-16 | Discharge: 2022-03-16 | Disposition: A | Discharge status: Home / Self Care | Payer: Medicare HMO | Source: Ambulatory Visit | Attending: Nurse Practitioner | Admitting: Nurse Practitioner

## 2022-03-16 DIAGNOSIS — K6389 Other specified diseases of intestine: Secondary | ICD-10-CM | POA: Diagnosis not present

## 2022-03-16 DIAGNOSIS — C772 Secondary and unspecified malignant neoplasm of intra-abdominal lymph nodes: Secondary | ICD-10-CM | POA: Diagnosis not present

## 2022-03-16 DIAGNOSIS — C210 Malignant neoplasm of anus, unspecified: Secondary | ICD-10-CM | POA: Insufficient documentation

## 2022-03-16 DIAGNOSIS — R918 Other nonspecific abnormal finding of lung field: Secondary | ICD-10-CM | POA: Diagnosis not present

## 2022-03-16 DIAGNOSIS — N3289 Other specified disorders of bladder: Secondary | ICD-10-CM | POA: Diagnosis not present

## 2022-03-16 MED ORDER — IOHEXOL 300 MG/ML  SOLN
100.0000 mL | Freq: Once | INTRAMUSCULAR | Status: AC | PRN
  Administered 2022-03-16: 85 mL via INTRAVENOUS

## 2022-03-19 ENCOUNTER — Other Ambulatory Visit: Payer: Self-pay | Admitting: Oncology

## 2022-03-24 ENCOUNTER — Inpatient Hospital Stay: Payer: Medicare HMO | Admitting: Oncology

## 2022-03-24 ENCOUNTER — Encounter: Payer: Self-pay | Admitting: *Deleted

## 2022-03-24 ENCOUNTER — Inpatient Hospital Stay: Payer: Medicare HMO | Attending: Nurse Practitioner

## 2022-03-24 ENCOUNTER — Inpatient Hospital Stay: Payer: Medicare HMO

## 2022-03-24 VITALS — BP 129/78 | HR 82 | Temp 98.2°F | Resp 18 | Ht 61.0 in | Wt 161.2 lb

## 2022-03-24 DIAGNOSIS — C210 Malignant neoplasm of anus, unspecified: Secondary | ICD-10-CM | POA: Diagnosis not present

## 2022-03-24 DIAGNOSIS — C772 Secondary and unspecified malignant neoplasm of intra-abdominal lymph nodes: Secondary | ICD-10-CM

## 2022-03-24 DIAGNOSIS — Z79899 Other long term (current) drug therapy: Secondary | ICD-10-CM | POA: Diagnosis not present

## 2022-03-24 DIAGNOSIS — Z5112 Encounter for antineoplastic immunotherapy: Secondary | ICD-10-CM | POA: Insufficient documentation

## 2022-03-24 LAB — CBC WITH DIFFERENTIAL (CANCER CENTER ONLY)
Abs Immature Granulocytes: 0.02 10*3/uL (ref 0.00–0.07)
Basophils Absolute: 0 10*3/uL (ref 0.0–0.1)
Basophils Relative: 1 %
Eosinophils Absolute: 0.2 10*3/uL (ref 0.0–0.5)
Eosinophils Relative: 4 %
HCT: 37 % (ref 36.0–46.0)
Hemoglobin: 11.5 g/dL — ABNORMAL LOW (ref 12.0–15.0)
Immature Granulocytes: 1 %
Lymphocytes Relative: 15 %
Lymphs Abs: 0.6 10*3/uL — ABNORMAL LOW (ref 0.7–4.0)
MCH: 28.5 pg (ref 26.0–34.0)
MCHC: 31.1 g/dL (ref 30.0–36.0)
MCV: 91.8 fL (ref 80.0–100.0)
Monocytes Absolute: 0.4 10*3/uL (ref 0.1–1.0)
Monocytes Relative: 10 %
Neutro Abs: 2.8 10*3/uL (ref 1.7–7.7)
Neutrophils Relative %: 69 %
Platelet Count: 207 10*3/uL (ref 150–400)
RBC: 4.03 MIL/uL (ref 3.87–5.11)
RDW: 14.9 % (ref 11.5–15.5)
WBC Count: 4.1 10*3/uL (ref 4.0–10.5)
nRBC: 0 % (ref 0.0–0.2)

## 2022-03-24 LAB — CMP (CANCER CENTER ONLY)
ALT: 11 U/L (ref 0–44)
AST: 23 U/L (ref 15–41)
Albumin: 4.2 g/dL (ref 3.5–5.0)
Alkaline Phosphatase: 62 U/L (ref 38–126)
Anion gap: 9 (ref 5–15)
BUN: 22 mg/dL (ref 8–23)
CO2: 25 mmol/L (ref 22–32)
Calcium: 10 mg/dL (ref 8.9–10.3)
Chloride: 101 mmol/L (ref 98–111)
Creatinine: 0.98 mg/dL (ref 0.44–1.00)
GFR, Estimated: >60 mL/min (ref >60)
Glucose, Bld: 80 mg/dL (ref 70–99)
Potassium: 4.3 mmol/L (ref 3.5–5.1)
Sodium: 135 mmol/L (ref 135–145)
Total Bilirubin: 0.4 mg/dL (ref 0.3–1.2)
Total Protein: 7.7 g/dL (ref 6.5–8.1)

## 2022-03-24 LAB — TSH: TSH: 7.672 u[IU]/mL — ABNORMAL HIGH (ref 0.350–4.500)

## 2022-03-24 MED ORDER — SODIUM CHLORIDE 0.9 % IV SOLN
480.0000 mg | Freq: Once | INTRAVENOUS | Status: AC
  Administered 2022-03-24: 480 mg via INTRAVENOUS
  Filled 2022-03-24: qty 48

## 2022-03-24 MED ORDER — SODIUM CHLORIDE 0.9 % IV SOLN: Freq: Once | INTRAVENOUS | Status: AC

## 2022-03-24 NOTE — Progress Notes (Signed)
Patient seen by Dr. Sherrill today ? ?Vitals are within treatment parameters. ? ?Labs reviewed by Dr. Sherrill and are within treatment parameters. ? ?Per physician team, patient is ready for treatment and there are NO modifications to the treatment plan.  ?

## 2022-03-24 NOTE — Progress Notes (Signed)
[place] OFFICE PROGRESS NOTE   Diagnosis: Anal cancer  INTERVAL HISTORY:   Ashley Moore completed another cycle of nivolumab on 02/23/2022.  No rash or diarrhea.  She reports malaise for a few weeks following chemotherapy.  She relates this in part to "stress "at work.  No leg swelling.  No palpable neck nodes.  Objective:  Vital signs in last 24 hours:  Blood pressure 129/78, pulse 82, temperature 98.2 F (36.8 C), temperature source Oral, resp. rate 18, height '5\' 1"'$  (1.549 m), weight 161 lb 3.2 oz (73.1 kg), SpO2 100 %.    Lymphatics: No cervical, supraclavicular, or inguinal nodes Resp: Lungs with diffuse end inspiratory fine rales, no respiratory distress Cardio: Regular rate and rhythm GI: No hepatosplenomegaly Vascular: No leg edema Skin: No rash  Portacath/PICC-without erythema  Lab Results:  Lab Results  Component Value Date   WBC 4.1 03/24/2022   HGB 11.5 (L) 03/24/2022   HCT 37.0 03/24/2022   MCV 91.8 03/24/2022   PLT 207 03/24/2022   NEUTROABS 2.8 03/24/2022    CMP  Lab Results  Component Value Date   NA 135 02/23/2022   K 5.2 (H) 02/23/2022   CL 102 02/23/2022   CO2 22 02/23/2022   GLUCOSE 82 02/23/2022   BUN 20 02/23/2022   CREATININE 1.02 (H) 02/23/2022   CALCIUM 9.5 02/23/2022   PROT 7.2 02/23/2022   ALBUMIN 3.8 02/23/2022   AST 22 02/23/2022   ALT 11 02/23/2022   ALKPHOS 65 02/23/2022   BILITOT 0.4 02/23/2022   GFRNONAA 58 (L) 02/23/2022   GFRAA >60 06/17/2020    Lab Results  Component Value Date   CEA1 1.15 03/04/2021   CEA 1.16 03/04/2021     Medications: I have reviewed the patient's current medications.   Assessment/Plan:  Anal cancer CT abdomen/pelvis 10/21/2016-thickening of the anus extending to the junction between the anus and rectum with a large stool ball in the rectum and mild fat stranding posterior to the rectum. Enlarged lymph nodes in the right and left inguinal regions. A few mildly prominent  nodes seen posterior to the rectum. 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 PET scan 74/[id number] hypermetabolic anal mass with hypermetabolic metastases to the groin region bilaterally, left pelvic sidewall and presacral space. Initiation of radiation and cycle 1 5-FU/mitomycin C 11/14/2016 Cycle 2 5-FU/mitomycin C 12/12/2016 (5-FU dose reduced due to mucositis, diarrhea, skin breakdown) Radiation completed 12/23/2016 CT abdomen/pelvis 04/14/2017-resolution of anal mass and bilateral inguinal lymphadenopathy. No residual tumor seen. CT abdomen/pelvis 01/28/2020-right hydroureteronephrosis, transition at the level of the mid right ureter, retroperitoneal lymphadenopathy CT renal stone study 01/28/2020-severe right hydronephrosis and proximal right hydroureter, 8 mm area of increased attenuation in the proximal right ureter-stone versus soft tissue mass PET 02/13/2020-hypermetabolic right retroperitoneal nodal metastases in the aortocaval and posterior pericaval chains, nonspecific mild anal wall hypermetabolism, right nephroureteral stent 02/21/2020 anorectal exam per Dr. Ashley Jacobs radiation changes of the skin around the anal margin.  Posterior midline smooth scarring.  Mildly stenotic.  Stenosis seems better.  No palpable concerning lesions.  Entire anal canal and distal rectum feels smooth and healthy.  Partial anoscopy performed with no lesions in the anal canal. Xeloda/radiation beginning 03/02/2020, completed 04/13/2020 CT abdomen/pelvis 06/17/2020-resolution of right retroperitoneal lymphadenopathy, no remaining pathologically enlarged lymph nodes, residual mild right hydronephrosis, no evidence of progressive disease, stable anal wall thickening Digital rectal exam by Dr. Quentin Cornwall 08/26/2020-posterior midline smooth scarring.  Mildly stenotic.  Stenosis seems better.  No palpable concerning  lesions.  Anal canal feels smooth without palpable concern.  Unable to  tolerate anoscopy.  Next visit in 6 months for surveillance. CT abdomen/pelvis 10/28/2020-no evidence of local recurrence or metastatic disease.  Stable mild rectal wall thickening.  New diffuse bladder wall thickening possibly related to prior radiation CT abdomen/pelvis 01/16/2021- recurrent right-sided hydronephrosis and proximal right hydroureter status post removal of right ureter stent, new soft tissue nodule along the course of the right ureter between the right psoas and IVC suspicious for recurrent lymphadenopathy PET 02/12/2021-small focus of hypermetabolism at the anorectal junction without a definable mass, right retroperitoneal nodal metastasis with obstruction of the proximal right ureter, right middle lobe hypermetabolic nodule, hypermetabolism in the right hilum without a defined nodal mass, hypermetabolic left supraclavicular and left axillary nodes 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 Cycle 1 Taxol/carboplatin 03/11/2021 Cycle 2 Taxol/carboplatin 03/31/2021, Fulphila Cycle 3 Taxol/carboplatin 04/21/2021, Fulphila CTs 05/11/2021-decrease size of FDG avid nodule in right middle lobe, right middle lobe subpleural nodule slightly enlarged, decreased in size of left axillary and subpectoral nodes, unchanged FDG avid right iliac nodes Cycle 4 Taxol/carboplatin 05/12/2021, G-CSF discontinued secondary to poor tolerance, Taxol and carboplatin dose reduced Cycle 5 Taxol/carboplatin 06/02/2021 Cycle 6 carboplatin 07/01/2021, Taxol held due to neuropathy Cycle 7 carboplatin 07/29/2021, Taxol held due to neuropathy CTs 08/16/2021-new liver lesion, increase in right psoas lymph node, unchanged right hydronephrosis, new mediastinal and right hilar lymphadenopathy, enlargement of bilateral pulmonary nodules and at least one new nodule, new subcarinal node Cycle 1 nivolumab 08/25/2021 Cycle 2 nivolumab 09/08/2021 Cycle 3 nivolumab 09/22/2021 Cycle 4  nivolumab 10/06/2021 Cycle 5 nivolumab 10/20/2021 Cycle 6 nivolumab 11/04/2021 CTs 11/12/2021-resolution of lung nodules, decreased size of right psoas node, no remaining mediastinal lymphadenopathy, resolution of right liver lesion Cycle 7 nivolumab 11/17/2021 Cycle 8 nivolumab 12/01/2021 Cycle 9 nivolumab 12/15/2021 Cycle 10 nivolumab 12/30/2021 Cycle 11 nivolumab 01/12/2022 Cycle 12 nivolumab 5/10/265-monthy dosing Cycle 13 nivolumab 02/23/2022 CT 03/16/2022-stable right psoas mass, no pulmonary nodules, persistent circumferential anal thickening, unchanged subcentimeter left liver lesion Cycle 14 nivolumab 03/24/2022 Left labial lesions. Question direct extension from the anal cancer versus metastatic disease from anal cancer versus a separate malignant process. 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 Multiple family members with breast cancer. History of hypokalemia-likely secondary to decreased nutritional intake and diarrhea; potassium in normal range 01/16/2017. No longer taking a potassium supplement. Right hydroureteronephrosis on CT 01/28/2020, status post a cystoscopy/pyelogram 01/28/2020 confirming extrinsic compression of the right ureter, status post stent placement Right ureter stent exchange 06/12/2020 Right ureter stent removed 01/13/2021   8.  Admission 01/07/2021 with Klebsiella pneumoniae urosepsis/bacteremia  9.  Hospital admission 05/15/2021 with UTI/possible right pyelonephritis       Disposition: Ms. KLajeunesseappears stable.  The restaging CTs revealed no evidence of disease progression.  I reviewed the CT images with her.  I recommend continuing monthly nivolumab.  She is in clinical remission from anal cancer.  She will complete another treatment with nivolumab today.  She will return for an office visit in nivolumab in 1 month.  GBetsy Coder MD  03/24/2022  9:45  AM

## 2022-03-24 NOTE — Patient Instructions (Signed)
[place]   Discharge Instructions: Thank you for choosing [facility] to provide your oncology and hematology care.   If you have a lab appointment with the [facility], please go directly to the [facility] and check in at the registration area.   Wear comfortable clothing and clothing appropriate for easy access to any Portacath or PICC line.   We strive to give you quality time with your provider. You may need to reschedule your appointment if you arrive late (15 or more minutes).  Arriving late affects you and other patients whose appointments are after yours.  Also, if you miss three or more appointments without notifying the office, you may be dismissed from the clinic at the provider's discretion.      For prescription refill requests, have your pharmacy contact our office and allow 72 hours for refills to be completed.    Today you received the following chemotherapy and/or immunotherapy agents Nivolumab (OPDIVO).      To help prevent nausea and vomiting after your treatment, we encourage you to take your nausea medication as directed.  BELOW ARE SYMPTOMS THAT SHOULD BE REPORTED IMMEDIATELY: *FEVER GREATER THAN 100.4 F (38 C) OR HIGHER *CHILLS OR SWEATING *NAUSEA AND VOMITING THAT IS NOT CONTROLLED WITH YOUR NAUSEA MEDICATION *UNUSUAL SHORTNESS OF BREATH *UNUSUAL BRUISING OR BLEEDING *URINARY PROBLEMS (pain or burning when urinating, or frequent urination) *BOWEL PROBLEMS (unusual diarrhea, constipation, pain near the anus) TENDERNESS IN MOUTH AND THROAT WITH OR WITHOUT PRESENCE OF ULCERS (sore throat, sores in mouth, or a toothache) UNUSUAL RASH, SWELLING OR PAIN  UNUSUAL VAGINAL DISCHARGE OR ITCHING   Items with * indicate a potential emergency and should be followed up as soon as possible or go to the Emergency Department if any problems should occur.  Please show the CHEMOTHERAPY ALERT CARD or IMMUNOTHERAPY ALERT CARD at  check-in to the Emergency Department and triage nurse.  Should you have questions after your visit or need to cancel or reschedule your appointment, please contact [facility]  Dept: [phone]  and follow the prompts.  Office hours are 8:00 a.m. to 4:30 p.m. Monday - Friday. Please note that voicemails left after 4:00 p.m. may not be returned until the following business day.  We are closed weekends and major holidays. You have access to a nurse at all times for urgent questions. Please call the main number to the clinic Dept: [phone] and follow the prompts.   For any non-urgent questions, you may also contact your provider using MyChart. We now offer e-Visits for anyone 73 and older to request care online for non-urgent symptoms. For details visit [email or web address].   Also download the MyChart app! Go to the app store, search "MyChart", open the app, select [facility], and log in with your MyChart username and password.  Masks are optional in the cancer centers. If you would like for your care team to wear a mask while they are taking care of you, please let them know. For doctor visits, patients may have with them one support person who is at least 73 years old. At this time, visitors are not allowed in the infusion area.  Nivolumab injection What is this medication? NIVOLUMAB (nye VOL ue mab) is a monoclonal antibody. It treats certain types of cancer. Some of the cancers treated are colon cancer, head and neck cancer, Hodgkin lymphoma, lung cancer, and melanoma. This medicine may be used for other purposes; ask  your health care provider or pharmacist if you have questions. COMMON BRAND NAME(S): Opdivo What should I tell my care team before I take this medication? They need to know if you have any of these conditions: Autoimmune diseases such as Crohn's disease, ulcerative colitis, or lupus Have had or planning to have an allogeneic stem cell transplant  (uses someone else's stem cells) History of chest radiation Organ transplant Nervous system problems such as myasthenia gravis or Guillain-Barre syndrome An unusual or allergic reaction to nivolumab, other medicines, foods, dyes, or preservatives Pregnant or trying to get pregnant Breast-feeding How should I use this medication? This medication is injected into a vein. It is given in a hospital or clinic setting. A special MedGuide will be given to you before each treatment. Be sure to read this information carefully each time. Talk to your care team regarding the use of this medication in children. While it may be prescribed for children as young as 12 years for selected conditions, precautions do apply. Overdosage: If you think you have taken too much of this medicine contact a poison control center or emergency room at once. NOTE: This medicine is only for you. Do not share this medicine with others. What if I miss a dose? Keep appointments for follow-up doses. It is important not to miss your dose. Call your care team if you are unable to keep an appointment. What may interact with this medication? Interactions have not been studied. This list may not describe all possible interactions. Give your health care provider a list of all the medicines, herbs, non-prescription drugs, or dietary supplements you use. Also tell them if you smoke, drink alcohol, or use illegal drugs. Some items may interact with your medicine. What should I watch for while using this medication? Your condition will be monitored carefully while you are receiving this medication. You may need blood work done while you are taking this medication. Do not become pregnant while taking this medication or for 5 months after stopping it. Women should inform their care team if they wish to become pregnant or think they might be pregnant. There is a potential for serious harm to an unborn child. Talk to your care team for more  information. Do not breast-feed an infant while taking this medication or for 5 months after stopping it. What side effects may I notice from receiving this medication? Side effects that you should report to your care team as soon as possible: Allergic reactions--skin rash, itching, hives, swelling of the face, lips, tongue, or throat Bloody or black, tar-like stools Change in vision Chest pain Diarrhea Dry cough, shortness of breath or trouble breathing Eye pain Fast or irregular heartbeat Fever, chills High blood sugar (hyperglycemia)--increased thirst or amount of urine, unusual weakness or fatigue, blurry vision High thyroid levels (hyperthyroidism)--fast or irregular heartbeat, weight loss, excessive sweating or sensitivity to heat, tremors or shaking, anxiety, nervousness, irregular menstrual cycle or spotting Kidney injury--decrease in the amount of urine, swelling of the ankles, hands, or feet Liver injury--right upper belly pain, loss of appetite, nausea, light-colored stool, dark yellow or brown urine, yellowing skin or eyes, unusual weakness or fatigue Low red blood cell count--unusual weakness or fatigue, dizziness, headache, trouble breathing Low thyroid levels (hypothyroidism)--unusual weakness or fatigue, increased sensitivity to cold, constipation, hair loss, dry skin, weight gain, feelings of depression Mood and behavior changes-confusion, change in sex drive or performance, irritability Muscle pain or cramps Pain, tingling, or numbness in the hands or feet, muscle weakness,  trouble walking, loss of balance or coordination Red or dark brown urine Redness, blistering, peeling, or loosening of the skin, including inside the mouth Stomach pain Unusual bruising or bleeding Side effects that usually do not require medical attention (report to your care team if they continue or are bothersome): Bone pain Constipation Loss of appetite Nausea Tiredness Vomiting This list may  not describe all possible side effects. Call your doctor for medical advice about side effects. You may report side effects to FDA at 1-800-FDA-1088. Where should I keep my medication? This medication is given in a hospital or clinic and will not be stored at home. NOTE: This sheet is a summary. It may not cover all possible information. If you have questions about this medicine, talk to your doctor, pharmacist, or health care provider.  2023 Elsevier/Gold Standard (2021-08-06 00:00:00)

## 2022-03-25 ENCOUNTER — Other Ambulatory Visit: Payer: Self-pay

## 2022-03-25 DIAGNOSIS — C772 Secondary and unspecified malignant neoplasm of intra-abdominal lymph nodes: Secondary | ICD-10-CM

## 2022-03-28 ENCOUNTER — Ambulatory Visit (INDEPENDENT_AMBULATORY_CARE_PROVIDER_SITE_OTHER): Payer: Medicare HMO | Admitting: Physical Therapy

## 2022-03-28 ENCOUNTER — Encounter: Payer: Self-pay | Admitting: Cardiovascular Disease

## 2022-03-28 ENCOUNTER — Encounter: Payer: Self-pay | Admitting: Physical Therapy

## 2022-03-28 ENCOUNTER — Encounter: Payer: Self-pay | Admitting: Internal Medicine

## 2022-03-28 DIAGNOSIS — R293 Abnormal posture: Secondary | ICD-10-CM

## 2022-03-28 DIAGNOSIS — M25611 Stiffness of right shoulder, not elsewhere classified: Secondary | ICD-10-CM | POA: Diagnosis not present

## 2022-03-28 DIAGNOSIS — G8929 Other chronic pain: Secondary | ICD-10-CM

## 2022-03-28 DIAGNOSIS — M6281 Muscle weakness (generalized): Secondary | ICD-10-CM | POA: Diagnosis not present

## 2022-03-28 DIAGNOSIS — R600 Localized edema: Secondary | ICD-10-CM | POA: Diagnosis not present

## 2022-03-28 DIAGNOSIS — M25511 Pain in right shoulder: Secondary | ICD-10-CM

## 2022-03-28 NOTE — Therapy (Addendum)
OUTPATIENT PHYSICAL THERAPY TREATMENT NOTE         Patient Name: Ashley Moore MRN: [id number] DOB:1951-06-28, 71 y.o., female Today's Date: 03/28/2022  PCP: [name], MD REFERRING PROVIDER: No ref. provider found    PT End of Session - 03/28/22 1604     Visit Number 16    Number of Visits 22    Date for PT Re-Evaluation 04/08/22    Authorization Type HU[employer] 12 visits 4/18 - 03/11/2022    Authorization Time Period 12 visits 4/18 - 03/11/2022, resubmitted on 03/14/22 for 12 more visits through 06/14/22    Authorization - Visit Number 7    Authorization - Number of Visits 12    Progress Note Due on Visit 20    PT Start Time 1510    PT Stop Time 1555    PT Time Calculation (min) 45 min    Activity Tolerance Patient limited by pain    Behavior During Therapy [facility] for tasks assessed/performed                 PT End of Session - 03/28/22 1604     Visit Number 16    Number of Visits 22    Date for PT Re-Evaluation 04/08/22    Authorization Type HUMANA 12 visits 4/18 - 03/11/2022    Authorization Time Period 12 visits 4/18 - 03/11/2022, resubmitted on 03/14/22 for 12 more visits through 06/14/22    Authorization - Visit Number 7    Authorization - Number of Visits 12    Progress Note Due on Visit 29    PT Start Time 1510    PT Stop Time 1555    PT Time Calculation (min) 45 min    Activity Tolerance Patient limited by pain    Behavior During Therapy WFL for tasks assessed/performed                       Past Medical History:  Diagnosis Date   Arthritis    Bowen's disease    excised 1992   Chronic low back pain    Chronic radiation cystitis    DDD (degenerative disc disease), cervical    Family history of breast cancer    Family history of lung cancer    Family history of non-Hodgkin's lymphoma    Family history of ovarian cancer    Headache    Hx of varicella    Hydronephrosis of right kidney    urologist--- dr Tresa Moore,  malignant,  treated  with ureter stent   Hypothyroidism    followed by pcp   Lower urinary tract symptoms (LUTS)    01-12-2021  per pt treated with accupuncture at dr Tresa Moore office   Lymphedema of both lower extremities    PICC (peripherally inserted central catheter) in place 01/11/2021   placed at [facility] in [place], [place] for IV antibiotics   Positive blood culture 01/08/2021   Klebsiella Pneumaniae,  treated with daily IV antibiotic   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[facility]) oncologist--- dr Benay Spice   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,  invasive SCC , completed chemo/ radiation 12-23-2016;  recurrent metastatsis retroperitoneal lymphdenopathy,  completed radiation 04-13-2020 residual right ureteral uropathy obstruction   Retroperitoneal lymphadenopathy    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   Sepsis due to Klebsiella pneumoniae ([place]) 01/07/2021   pt admitted to [facility] in [place],  dx sepsis secondary to acute pyelonephritis with bacteremia , positive blood  culture,  discharged 01-11-2021 home daily IV antibiotic   Wears glasses    Past Surgical History:  Procedure Laterality Date   BUNIONECTOMY Right yrs ago   [place] Right 06/12/2020   Procedure: CYSTOSCOPY WITH RETROGRADE PYELOGRAM/URETERAL STENT EXCHANGE;  Surgeon: Alexis Frock, MD;  Location: [facility];  Service: Urology;  Laterality: Right;   CYSTOSCOPY W/ URETERAL STENT PLACEMENT Right 01/13/2021   Procedure: CYSTOSCOPY WITH RETROGRADE PYELOGRAM/URETERAL STENT REMOVALRIGHT;  Surgeon: Alexis Frock, MD;  Location: [facility];  Service: Urology;  Laterality: Right;  [place], URETEROSCOPY AND STENT PLACEMENT Right 01/28/2020   Procedure: CYSTOSCOPY WITH RETROGRADE PYELOGRAM, URETEROSCOPY AND STENT PLACEMENT;  Surgeon: Alexis Frock, MD;  Location: [facility];  Service: Urology;  Laterality: Right;   IR GENERIC HISTORICAL   11/14/2016   IR US GUIDE VASC ACCESS RIGHT 11/14/2016 [facility]   IR GENERIC HISTORICAL  11/14/2016   IR FLUORO GUIDE CV LINE RIGHT 11/14/2016 [facility]   IR GENERIC HISTORICAL  12/12/2016   IR US GUIDE VASC ACCESS RIGHT 12/12/2016 [name], MD [facility]   IR GENERIC HISTORICAL  12/12/2016   IR FLUORO GUIDE CV LINE RIGHT 12/12/2016 [name], MD [facility]   SKIN CANCER EXCISION  1990   bowens disease   Patient Active Problem List   Diagnosis Date Noted   Primary osteoarthritis, 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/28/2021   Atypical chest pain 06/29/2021   Intractable abdominal pain 05/16/2021   Acute pyelonephritis 05/16/2021   Acute cystitis without hematuria 05/16/2021   Hydronephrosis of right kidney 05/16/2021   Obstructive uropathy 05/16/2021   Hypothyroidism 05/16/2021   Hyponatremia 05/16/2021   Obesity (BMI 30-39.9) 05/16/2021   Goals of care, counseling/discussion 03/01/2021   SIRS (systemic inflammatory response syndrome) ([place]) 01/13/2021   Pyelonephritis 01/13/2021   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   Genetic testing 01/27/2020   Family history of breast cancer    Family history of lung cancer    Family history of ovarian cancer    Family history of non-Hodgkin's lymphoma    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   Port catheter in place 11/21/2016   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   Anal cancer ([place]) 11/03/2016   Cystitis 05/06/2015   Degenerative cervical disc 04/08/2015   Trigger point of neck 03/26/2015   Pain in joint, ankle and foot 04/23/2013   Visit for preventive health examination 02/13/2013   Routine gynecological examination 02/13/2013   Family history of breast cancer in first degree relative 02/13/2013   Rash and nonspecific skin eruption 02/13/2013   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   VITAMIN D DEFICIENCY 06/25/2008   NECK PAIN 06/02/2008   FOOT PAIN 06/02/2008   Osteopenia 06/02/2008    REFERRING DIAG:  M25.511,G89.29 (ICD-10-CM) - Chronic right shoulder pain   THERAPY DIAG:  Stiffness of right shoulder, not elsewhere classified  Chronic right shoulder pain  Muscle weakness (generalized)  Localized edema  Abnormal posture  PERTINENT HISTORY: Arthritis Bowen's diseaseexcised 1992 Chronic low back pain Chronic radiation cystitis DDD (degenerative disc disease), cervical Family history of breast cancer Family history of lung cancer Family history of non-Hodgkin's lymphoma Family history of ovarian cancer Headache Hx of varicella Hydronephrosis of right kidney rectal cancer   PRECAUTIONS: Other: infusion therapy for Cancer , fall precuations   SUBJECTIVE:  Pt arriving reporting her cancer may be in remission.  PAIN:  Are you having pain? 2-3/10 NPRS scale: 5/10  with movements and trying to reach face Pain location: anterior shoulder and lateral Rt shoulder into elbow Pain orientation: Right  PAIN TYPE: aching Pain description: constant  Aggravating factors: reaching upward, lateral movements Relieving factors: resting and heat    OBJECTIVE:      PATIENT SURVEYS:  FOTO: 51% on 10/19/2021 FOTO: 53% on 01/04/2022 FOTO: 54.3% 03/14/2022  UPPER EXTREMITY AROM/PROM:   Active ROM  In supine Right 10/19/2021 Left 10/19/2021 Rt  02/15/22 Rt 03/01/2022  Rt 03/14/22  Shoulder flexion 120 164 130 140 142  Shoulder extension 30 45   50     Shoulder abduction 98 160 125  130  Shoulder adduction         Shoulder internal rotation 75 shoulder abd 45 degrees 75 75 Shoulder abd 45 deg  75  Shoulder abd 45 deg  Shoulder external rotation 15 Shoulder abd 45 degrees 80 10 Shoulder abd 45 deg  0 Shoulder abd 45 deg  Elbow flexion 140   145 145    Elbow extension 0 0 0    Wrist flexion         Wrist extension         Wrist ulnar deviation         Wrist radial deviation         Wrist pronation         Wrist supination         (Blank rows = not tested)   Passive ROM In degrees  Right 10/19/21 Right 11/09/21 Right  11/16/21 Right 12/06/21 Rt 12/13/21 Rt 01/04/22 supine Rt 02/15/22 supine Rt 03/14/22  Shoulder flexion 130  120 134 145 145 155 160   Shoulder extension 35          Shoulder abduction 105 104  110 120 120 150 155   Shoulder adduction            Shoulder internal rotation 80 shoulder abd 45 degrees    80 shoulder  abducted 45 degrees 78 shoulder  abducted 45 degrees 75 degrees (Shoulder abd 45 degrees) 75 deg Shoulder abd 45 deg 75 Shoulder abd 45 deg  Shoulder external rotation 20 Shoulder abd 45 degrees 30 Shoulder abducted to 45 degrees   30 Shoulder abducted to 45 degrees 30 Shoulder abducted to 45 degrees 28 degrees Shoulder abd 45 degrees) 30 deg Shoulder abd 45 deg 10  Shoulder abd 45 deg  Elbow flexion            Elbow extension            Wrist flexion            Wrist extension            Wrist ulnar deviation            Wrist radial deviation            Wrist pronation            Wrist supination                UPPER EXTREMITY MMT:   MMT Right 10/19/2021 Left 10/19/2021 Right 12/06/2021 Rt 12/28/2021 Rt 03/07/22 Rt 03/14/2022  Shoulder flexion 2+/5 4+/5 3-/5 3/5 3+/5 3+/5  Shoulder extension 2+/5 4+/5 3-/5 3/5 3+/5 3+/5  Shoulder abduction 2+/5 4+/5 3-/5 3/5 3+/5 3+/5  Shoulder adduction          Shoulder internal rotation 2+/5 4+/5 3-/5 3/5 3+/5 3+/5  Shoulder external rotation 2+/5 4+/5 3-/5 3/5 3+/5  2+/5 tested at 0 deg  Middle trapezius          Lower trapezius          Elbow flexion          Elbow extension          Wrist flexion          Wrist extension          Wrist ulnar deviation          Wrist radial deviation          Wrist pronation          Wrist supination          Grip strength (lbs) 31.8 pounds 48 pounds    29.8 pounds  38.2 pounds  (Blank rows = not tested)                  TODAY'S TREATMENT:   03/28/2022:  TherEx: AA flexion sitting c 1 # bar 2 x 10 to 90 degrees AA abduction sitting c 1 #  bar 2 x 10 to 70 degrees Bicep curls with 3 # weight x 10 Supine AAROM ER 2 x 10 Rolling green physioball in semicircle from side to side while sitting on edge of mat x 10 Rolling green physioball forward/back x 5 for shoulder flexion Rows: Level 3 band x 20 ER: level 2 band elbow at 90 degrees walking sideways   Manual: PROM: Rt shoulder flexion, ER, abduction, grade 2-3 AP GH mobs Modaliites Moist heat x 5 minutes at end of session   03/14/2022:  TherEx: AA flexion c 1 # bar 2 x 10 to 90 degrees AA abduction c 1 # bar 2 x 10 to 70 degrees Bicep curls with 3 # weight x 10 Supine AAROM ER 2 x 10 Sidelying Abd: x 10 UE ranger scaption x 1 minute in sitting Rows: Level 2 band 2 x 10 ER: level 2 band elbow at 90 degrees walking sideways   Manual: PROM: Rt shoulder flexion, ER, abduction    03/07/2022:  TherEx: AA flexion c 1 # bar 2 x 10 to 90 degrees AA abduction c 1 # bar 2 x 10 to 70 degrees Bicep curls with 3 # weight x 10 Ball rolling up wall x 10 with bilateral UE's UE ranger scaption x 1 minute in sitting Rows: Level 2 band 2 x 10 ER: level 2 band elbow at 90 degrees walking sideways   Manual: PROM: Rt shoulder flexion, ER, abduction  03/01/2022 Therex:  Supine AAROM flexion 1 lb x 10  UBE Level 1.5 3 mins fwd, 3 mins back  Seated isometrics to tolerance 5 sec hold on /off for Rt shoulder flexion, Er, IR x 6 each way  Rows green band 20x seated   02/15/2022:  TherEx: AA flexion c 1 # bar 2 x 10 to 90 degrees AA abduction c 1 # bar 2 x 10 to 70 degrees Bicep curls with 1 # weight x 10 Ball rolling up wall x 10 with bilateral UE's UE ranger scaption x 1 minute in sitting Rows: Level 2 band 2 x 10 ER: level 2 band elbow at 90 degrees walking sideways  UBE: Level 1.5 x 6 minutes (3 minutes each directions) Manual: PROM: Rt shoulder flexion, ER, abduction      PATIENT EDUCATION: Education details:added isometrics to pt's HEP  Person educated:  Patient Education method: Explanation Education comprehension: verbalized understanding, returned demonstration, and verbal cues required  HOME EXERCISE PROGRAM: Access Code: [email or web address] URL: [email or web address] Date: 10/19/2021 Prepared by: Kearney Hard   Exercises Seated Shoulder Flexion Towel Slide at Table Top - 2-3 x daily - 7 x weekly - 1 sets - 10 reps - 5-10 seocnds hold Seated Shoulder External Rotation PROM on Table - 2-3 x daily - 7 x weekly - 1 sets - 10 reps - 5-10 seconds hold Seated Scapular Retraction - 2-3 x daily - 7 x weekly - 1 sets - 10 reps - 5-10 seconds hold     ASSESSMENT:   CLINICAL IMPRESSION: Pt still presenting with popping noted with flexion based movements. Pt reporting improved movements and less stiffness following manual therapy and moist heat at end of session. Continue skilled PT as pt tolerates to maximize pt's functional mobility.     REHAB POTENTIAL: fair, due to co-morbidities   CLINICAL DECISION MAKING: Evolving/moderate complexity   EVALUATION COMPLEXITY: Moderate     GOALS: Goals reviewed with patient? Yes   SHORT TERM GOALS:   STG Name Target Date Goal status  1 Pt will be independent with her initial HEP.    11/16/2021 Met 11/09/2021  2 Pt will be able to perform 5 time sit to stand with no UE support </= 15 seconds Baseline:  11/16/2021 MET  01/04/2022    LONG TERM GOALS:    LTG Name Target Date Goal status  1 PT will be independent in her advanced HEP.  Baseline: 01/11/2022 MET  01/04/22  2 Pt will improve FOTO score to >/= 63 % Baseline: 53% on 01/04/2022 06/14/22 Ongoing 03/14/2022  3 Pt will improve her right shoulder ER to >/= 30 degrees to improve functional mobility.  Baseline: 06/14/22 Ongoing 03/14/2022  4 Pt will be able to report pain of </= 2/10 with ADL's using her right UE.  Baseline: pain depends on the day 01/04/22 06/14/22 On going  03/14/2022       5.   Pt will be able to reach her left  under arm for donning deodorant.  06/14/22 On-going 03/14/2022                      PLAN: PT FREQUENCY: 1x/ week for 12 weeks Approved for 12 visits from 01/04/2022 -  03/11/2022 Apporoved for 12 visits from 03/11/2022 to 06/14/2022     PT DURATION: 12 weeks   PLANNED INTERVENTIONS: Therapeutic exercises, Therapeutic activity, Neuro Muscular re-education, Balance training, Gait training, Patient/Family education, Joint mobilization, Dry Needling, Cryotherapy, Moist heat, Taping, Vasopneumatic device, and Manual therapy   PLAN FOR NEXT SESSION:  AAROM/AROM gains as tolerated, strengthening   Kearney Hard, PT, MPT 03/28/22 4:07 PM

## 2022-03-28 NOTE — Progress Notes (Unsigned)
Cardiology Office Note:    Date:  03/29/2022   ID:  Ashley Moore, DOB 1950-09-21, MRN [id number]  PCP:  [name], MD   [place] [facility] Cardiologist:  Hero Kulish    Referring MD: [name], MD   Chief Complaint  Patient presents with   Chest Pain    Oct. 11, 2022   Ashley Moore is a 71 y.o. female with a hx of chest pain .  We were asked to see her today by Dr. Fara Boros for further evaluation of her chest discomfort.  She has metastatic anal cancer - multiple widespread  Had an injection of of G- CSF.   Side effect of bone or muscle pan is listed at the 1st common side effect.   Not able to do much exercise Is just now able to do normal daily activities.   She fell in April.  Hs extensive bruising  Broke her R shoulder  Has done PT but has not needed surgery    March 29, 2022  Ashley Moore is seen for follow up of her cp [place] and broke her arm and leg  No further episodes of cp   She has had coronary artery calcifications noted on many of her CT scans.  We discussed getting a coronary calcium score but she would like to hold off, primarily based on the fact that she gets numerous CT scans anyway.  I agree that we do not necessarily need to quantify her coronary artery calcium score.  We do know that she is got some coronary artery calcium and we will aim for an LDL goal of between 50 and 70.  Her last LDL was 63 back in 2021. She is not currently on any statin medication  Past Medical History:  Diagnosis Date   Arthritis    Bowen's disease    excised 1992   Chronic low back pain    Chronic radiation cystitis    DDD (degenerative disc disease), cervical    Family history of breast cancer    Family history of lung cancer    Family history of non-Hodgkin's lymphoma    Family history of ovarian cancer    Headache    Hx of varicella    Hydronephrosis of right kidney    urologist--- dr Tresa Moore,  malignant,  treated with ureter stent    Hypothyroidism    followed by pcp   Lower urinary tract symptoms (LUTS)    01-12-2021  per pt treated with accupuncture at dr Tresa Moore office   Lymphedema of both lower extremities    PICC (peripherally inserted central catheter) in place 01/11/2021   placed at [facility] in [place], [place] for IV antibiotics   Positive blood culture 01/08/2021   Klebsiella Pneumaniae,  treated with daily IV antibiotic   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[facility]) oncologist--- dr Benay Spice   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,  invasive SCC , completed chemo/ radiation 12-23-2016;  recurrent metastatsis retroperitoneal lymphdenopathy,  completed radiation 04-13-2020 residual right ureteral uropathy obstruction   Retroperitoneal lymphadenopathy    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   Sepsis due to Klebsiella pneumoniae [facility]) 01/07/2021   pt admitted to [facility] in [place],  dx sepsis secondary to acute pyelonephritis with bacteremia , positive blood culture,  discharged 01-11-2021 home daily IV antibiotic   Wears glasses     Past Surgical History:  Procedure Laterality Date   BUNIONECTOMY Right yrs ago   [place]  STENT PLACEMENT Right 06/12/2020   Procedure: CYSTOSCOPY WITH RETROGRADE PYELOGRAM/URETERAL STENT EXCHANGE;  Surgeon: Alexis Frock, MD;  Location: [facility];  Service: Urology;  Laterality: Right;   CYSTOSCOPY W/ URETERAL STENT PLACEMENT Right 01/13/2021   Procedure: CYSTOSCOPY WITH RETROGRADE PYELOGRAM/URETERAL STENT REMOVALRIGHT;  Surgeon: Alexis Frock, MD;  Location: [facility];  Service: Urology;  Laterality: Right;  [place], URETEROSCOPY AND STENT PLACEMENT Right 01/28/2020   Procedure: CYSTOSCOPY WITH RETROGRADE PYELOGRAM, URETEROSCOPY AND STENT PLACEMENT;  Surgeon: Alexis Frock, MD;  Location: [facility];  Service: Urology;  Laterality: Right;   IR GENERIC HISTORICAL  11/14/2016   IR US  GUIDE VASC ACCESS RIGHT 11/14/2016 [facility]   IR GENERIC HISTORICAL  11/14/2016   IR FLUORO GUIDE CV LINE RIGHT 11/14/2016 [facility]   IR GENERIC HISTORICAL  12/12/2016   IR US GUIDE VASC ACCESS RIGHT 12/12/2016 [name], MD [facility]   IR GENERIC HISTORICAL  12/12/2016   IR FLUORO GUIDE CV LINE RIGHT 12/12/2016 [name], MD [facility]   SKIN CANCER EXCISION  1990   bowens disease    Current Medications: Current Meds  Medication Sig   acetaminophen (TYLENOL) 500 MG tablet Take 1,000 mg by mouth 2 (two) times daily.   Ascorbic Acid (VITAMIN C) 1000 MG tablet Take 1,000 mg by mouth daily.   azelastine (ASTELIN) 0.1 % nasal spray Place 2 sprays into both nostrils 2 (two) times daily. Use in each nostril as directed   Cholecalciferol (VITAMIN D3 PO) Take 10,000 Units by mouth daily.   conjugated estrogens (PREMARIN) vaginal cream Place 1 Applicatorful vaginally daily.   diclofenac Sodium (VOLTAREN) 1 % GEL Apply 2 g topically daily. To shoulder   levothyroxine (SYNTHROID) 75 MCG tablet TAKE 1 TABLET EVERY DAY   loratadine (CLARITIN) 10 MG tablet Take 10 mg by mouth daily.   LORazepam (ATIVAN) 0.5 MG tablet Take 1 tablet (0.5 mg total) by mouth every 8 (eight) hours as needed for anxiety (as needed).   MYRBETRIQ 50 MG TB24 tablet Take 50 mg by mouth daily.   OVER THE COUNTER MEDICATION Place 1 drop into both eyes daily as needed (dry eyes).   OVER THE COUNTER MEDICATION Gundry MD Total Restore   Prenatal Vit-Fe Fumarate-FA (PRENATAL PO) Take by mouth daily.   protein supplement shake (PREMIER PROTEIN) LIQD Take 11 oz by mouth daily.   TURMERIC PO Take 800 mg by mouth in the morning and at bedtime.     Allergies:   Sulfamethoxazole-trimethoprim, Benadryl [diphenhydramine], Melatonin, and Penicillins   Social History   Socioeconomic History   Marital status: Widowed    Spouse name: Not on file   Number of children: 0   Years of education: 14   Highest education level:  Associate degree: academic program  Occupational History   Occupation: self employed    Comment: full time Chief of Staff  Tobacco Use   Smoking status: Former    Packs/day: 1.00    Years: 25.00    Total pack years: 25.00    Types: Cigarettes    Quit date: 06/20/1995    Years since quitting: 26.7   Smokeless tobacco: Never  Vaping Use   Vaping Use: Never used  Substance and Sexual Activity   Alcohol use: Not Currently    Alcohol/week: 0.0 standard drinks of alcohol    Comment: occasional wine   Drug use: Yes    Types: Marijuana    Comment:  01-12-2021  per pt once a week   Sexual activity: Not on file  Other Topics Concern   Not on file  Social History Narrative   H H  of 1      5 pets.   She is a former smoker   Retired Programmer, applications; Conservator, museum/gallery   etoh   Red wine  1 per night.    Tea green tea and earl gray    Moved from [place] to [place] area in 12[place]20/1983   1 pregnancy   Husband died spring  2016 cv   Sister died Mar 22, 2015  Bone cancer    3 remaining sisters            Social Determinants of Health   Financial Resource Strain: Low Risk  (11/18/2021)   Overall Financial Resource Strain (CARDIA)    Difficulty of Paying Living Expenses: Not hard at all  Food Insecurity: No Food Insecurity (11/18/2021)   Hunger Vital Sign    Worried About Running Out of Food in the Last Year: Never true    Ran Out of Food in the Last Year: Never true  Transportation Needs: No Transportation Needs (11/18/2021)   PRAPARE - Hydrologist (Medical): No    Lack of Transportation (Non-Medical): No  Physical Activity: Inactive (11/18/2021)   Exercise Vital Sign    Days of Exercise per Week: 0 days    Minutes of Exercise per Session: 0 min  Stress: No Stress Concern Present (11/18/2021)   [place]    Feeling of Stress : Not at all  Social  Connections: Moderately Integrated (11/18/2021)   Social Connection and Isolation Panel [NHANES]    Frequency of Communication with Friends and Family: More than three times a week    Frequency of Social Gatherings with Friends and Family: More than three times a week    Attends Religious Services: More than 4 times per year    Active Member of [employer] or Organizations: Yes    Attends Archivist Meetings: More than 4 times per year    Marital Status: Widowed     Family History: The patient's family history includes Breast cancer (age of onset: 15) in her mother; Breast cancer (age of onset: 31) in her sister and sister; Breast cancer (age of onset: 88) in her maternal aunt; Breast cancer (age of onset: 58) in her maternal grandmother; Lung cancer in her mother; Melanoma in her sister; Non-Hodgkin's lymphoma in her paternal grandmother; Ovarian cancer (age of onset: 75) in her mother; Pancreatitis in her father.  ROS:   Please see the history of present illness.     All other systems reviewed and are negative.  EKGs/Labs/Other Studies Reviewed:    The following studies were reviewed today:   EKG: March 29, 2022: Normal sinus rhythm at 82.  Normal EKG.   Recent Labs: 03/24/2022: ALT 11; BUN 22; Creatinine 0.98; Hemoglobin 11.5; Platelet Count 207; Potassium 4.3; Sodium 135; TSH 7.672  Recent Lipid Panel    Component Value Date/Time   CHOL 153 01/08/2020 1008   TRIG 66.0 01/08/2020 1008   HDL 76.30 01/08/2020 1008   CHOLHDL 2 01/08/2020 1008   VLDL 13.2 01/08/2020 1008   LDLCALC 63 01/08/2020 1008     Risk Assessment/Calculations:           Physical Exam:    Physical Exam: Blood pressure  112/74, pulse 82, height '5\' 2"'$  (1.575 m), weight 163 lb 6.4 oz (74.1 kg), SpO2 98 %.  GEN:  Well nourished, well developed in no acute distress HEENT: Normal NECK: No JVD; No carotid bruits LYMPHATICS: No lymphadenopathy CARDIAC: RRR , no murmurs, rubs, gallops RESPIRATORY:   Clear to auscultation without rales, wheezing or rhonchi  ABDOMEN: Soft, non-tender, non-distended MUSCULOSKELETAL:  No edema; No deformity  SKIN: Warm and dry NEUROLOGIC:  Alert and oriented x 3   ASSESSMENT:    1. Coronary artery calcification     PLAN:    In order of problems listed above:  Atypical chest pain:  no further episodes of cp   2.  Coronary artery calcifications: She has had coronary artery calcifications noted on many of her CT scans.  We discussed getting a coronary calcium score but she would like to hold off, primarily based on the fact that she gets numerous CT scans anyway.  I agree that we do not necessarily need to quantify her coronary artery calcium score.  We do know that she is got some coronary artery calcium and we will aim for an LDL goal of between 50 and 70.  Her last LDL was 63 back in 2021. She is not currently on any statin medication      Medication Adjustments/Labs and Tests Ordered: Current medicines are reviewed at length with the patient today.  Concerns regarding medicines are outlined above.  Orders Placed This Encounter  Procedures   Lipid panel   EKG 12-Lead    No orders of the defined types were placed in this encounter.    Patient Instructions  Medication Instructions:  Your physician recommends that you continue on your current medications as directed. Please refer to the Current Medication list given to you today.  *If you need a refill on your cardiac medications before your next appointment, please call your pharmacy*   Lab Work: LIPIDS today If you have labs (blood work) drawn today and your tests are completely normal, you will receive your results only by: [place] (if you have MyChart) OR A paper copy in the mail If you have any lab test that is abnormal or we need to change your treatment, we will call you to review the results.   Testing/Procedures: NONE   Follow-Up: At [facility], you and  your health needs are our priority.  As part of our continuing mission to provide you with exceptional heart care, we have created designated Provider Care Teams.  These Care Teams include your primary Cardiologist (physician) and Advanced Practice Providers (APPs -  Physician Assistants and Nurse Practitioners) who all work together to provide you with the care you need, when you need it.  Your next appointment:   1 year(s)  The format for your next appointment:   In Person  Provider:   Jacklynn Lewis, or [employer] {     Important Information About Sugar         Signed, Mertie Moores, MD  03/29/2022 4:30 PM    [place]

## 2022-03-29 ENCOUNTER — Ambulatory Visit: Payer: Medicare HMO | Admitting: Cardiovascular Disease

## 2022-03-29 ENCOUNTER — Encounter: Payer: Self-pay | Admitting: Cardiovascular Disease

## 2022-03-29 VITALS — BP 112/74 | HR 82 | Ht 62.0 in | Wt 163.4 lb

## 2022-03-29 DIAGNOSIS — I2584 Coronary atherosclerosis due to calcified coronary lesion: Secondary | ICD-10-CM | POA: Diagnosis not present

## 2022-03-29 DIAGNOSIS — I2510 Atherosclerotic heart disease of native coronary artery without angina pectoris: Secondary | ICD-10-CM | POA: Diagnosis not present

## 2022-03-29 NOTE — Patient Instructions (Signed)
Medication Instructions:  Your physician recommends that you continue on your current medications as directed. Please refer to the Current Medication list given to you today.  *If you need a refill on your cardiac medications before your next appointment, please call your pharmacy*   Lab Work: LIPIDS today If you have labs (blood work) drawn today and your tests are completely normal, you will receive your results only by: [place] (if you have MyChart) OR A paper copy in the mail If you have any lab test that is abnormal or we need to change your treatment, we will call you to review the results.   Testing/Procedures: NONE   Follow-Up: At [facility], you and your health needs are our priority.  As part of our continuing mission to provide you with exceptional heart care, we have created designated Provider Care Teams.  These Care Teams include your primary Cardiologist (physician) and Advanced Practice Providers (APPs -  Physician Assistants and Nurse Practitioners) who all work together to provide you with the care you need, when you need it.  Your next appointment:   1 year(s)  The format for your next appointment:   In Person  Provider:   [place], Ann Maki, or [employer] {     Important Information About Sugar

## 2022-03-30 LAB — LIPID PANEL
Chol/HDL Ratio: 1.7 ratio (ref 0.0–4.4)
Cholesterol, Total: 156 mg/dL (ref 100–199)
HDL: 94 mg/dL (ref >39)
LDL Chol Calc (NIH): 50 mg/dL (ref 0–99)
Triglycerides: 56 mg/dL (ref 0–149)
VLDL Cholesterol Cal: 12 mg/dL (ref 5–40)

## 2022-04-11 ENCOUNTER — Other Ambulatory Visit: Payer: Self-pay

## 2022-04-12 ENCOUNTER — Ambulatory Visit: Payer: Medicare HMO | Admitting: Physical Therapy

## 2022-04-12 ENCOUNTER — Encounter: Payer: Self-pay | Admitting: Physical Therapy

## 2022-04-12 DIAGNOSIS — M25511 Pain in right shoulder: Secondary | ICD-10-CM

## 2022-04-12 DIAGNOSIS — M6281 Muscle weakness (generalized): Secondary | ICD-10-CM

## 2022-04-12 DIAGNOSIS — R600 Localized edema: Secondary | ICD-10-CM | POA: Diagnosis not present

## 2022-04-12 DIAGNOSIS — G8929 Other chronic pain: Secondary | ICD-10-CM

## 2022-04-12 DIAGNOSIS — R293 Abnormal posture: Secondary | ICD-10-CM | POA: Diagnosis not present

## 2022-04-12 DIAGNOSIS — M25611 Stiffness of right shoulder, not elsewhere classified: Secondary | ICD-10-CM

## 2022-04-12 NOTE — Therapy (Addendum)
OUTPATIENT PHYSICAL THERAPY TREATMENT NOTE         Patient Name: Ashley Moore MRN: [id number] DOB:[birth year], 71 y.o., female Today's Date: 04/12/2022  PCP: [name], MD REFERRING PROVIDER: No ref. provider found    PT End of Session - 04/12/22 1442     Visit Number 17    Number of Visits 22    Date for PT Re-Evaluation 04/08/22    Authorization Type [employer] 12 visits 4/18 - 03/11/2022    Authorization Time Period 12 visits 4/18 - 03/11/2022, resubmitted on 03/14/22 for 12 more visits through 06/14/22    Authorization - Visit Number 2    Authorization - Number of Visits 12    Progress Note Due on Visit 20    PT Start Time 1430    PT Stop Time 1513    PT Time Calculation (min) 43 min    Activity Tolerance Patient limited by pain    Behavior During Therapy [facility] for tasks assessed/performed                 PT End of Session - 04/12/22 1442     Visit Number 17    Number of Visits 22    Date for PT Re-Evaluation 04/08/22    Authorization Type [employer] 12 visits 4/18 - 03/11/2022    Authorization Time Period 12 visits 4/18 - 03/11/2022, resubmitted on 03/14/22 for 12 more visits through 06/14/22    Authorization - Visit Number 2    Authorization - Number of Visits 12    Progress Note Due on Visit 20    PT Start Time 1430    PT Stop Time 1513    PT Time Calculation (min) 43 min    Activity Tolerance Patient limited by pain    Behavior During Therapy WFL for tasks assessed/performed                       Past Medical History:  Diagnosis Date   Arthritis    Bowen's disease    excised 1992   Chronic low back pain    Chronic radiation cystitis    DDD (degenerative disc disease), cervical    Family history of breast cancer    Family history of lung cancer    Family history of non-Hodgkin's lymphoma    Family history of ovarian cancer    Headache    Hx of varicella    Hydronephrosis of right kidney    urologist--- dr Tresa Moore,  malignant,  treated  with ureter stent   Hypothyroidism    followed by pcp   Lower urinary tract symptoms (LUTS)    01-12-2021  per pt treated with accupuncture at dr Tresa Moore office   Lymphedema of both lower extremities    PICC (peripherally inserted central catheter) in place 01/11/2021   placed at [facility] in [place], [place] for IV antibiotics   Positive blood culture 01/08/2021   Klebsiella Pneumaniae,  treated with daily IV antibiotic   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[facility]) oncologist--- dr Benay Spice   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,  invasive SCC , completed chemo/ radiation 12-23-2016;  recurrent metastatsis retroperitoneal lymphdenopathy,  completed radiation 04-13-2020 residual right ureteral uropathy obstruction   Retroperitoneal lymphadenopathy    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   Sepsis due to Klebsiella pneumoniae ([place]) 01/07/2021   pt admitted to [facility] in [place],  dx sepsis secondary to acute pyelonephritis with bacteremia , positive blood  culture,  discharged 01-11-2021 home daily IV antibiotic   Wears glasses    Past Surgical History:  Procedure Laterality Date   BUNIONECTOMY Right yrs ago   [place] Right 06/12/2020   Procedure: CYSTOSCOPY WITH RETROGRADE PYELOGRAM/URETERAL STENT EXCHANGE;  Surgeon: Alexis Frock, MD;  Location: [facility];  Service: Urology;  Laterality: Right;   CYSTOSCOPY W/ URETERAL STENT PLACEMENT Right 01/13/2021   Procedure: CYSTOSCOPY WITH RETROGRADE PYELOGRAM/URETERAL STENT REMOVALRIGHT;  Surgeon: Alexis Frock, MD;  Location: [facility];  Service: Urology;  Laterality: Right;  [place], URETEROSCOPY AND STENT PLACEMENT Right 01/28/2020   Procedure: CYSTOSCOPY WITH RETROGRADE PYELOGRAM, URETEROSCOPY AND STENT PLACEMENT;  Surgeon: Alexis Frock, MD;  Location: [facility];  Service: Urology;  Laterality: Right;   IR GENERIC HISTORICAL   11/14/2016   IR US GUIDE VASC ACCESS RIGHT 11/14/2016 [facility]   IR GENERIC HISTORICAL  11/14/2016   IR FLUORO GUIDE CV LINE RIGHT 11/14/2016 [facility]   IR GENERIC HISTORICAL  12/12/2016   IR US GUIDE VASC ACCESS RIGHT 12/12/2016 [name], MD [facility]   IR GENERIC HISTORICAL  12/12/2016   IR FLUORO GUIDE CV LINE RIGHT 12/12/2016 [name], MD [facility]   SKIN CANCER EXCISION  1990   bowens disease   Patient Active Problem List   Diagnosis Date Noted   Primary osteoarthritis, 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/28/2021   Atypical chest pain 06/29/2021   Intractable abdominal pain 05/16/2021   Acute pyelonephritis 05/16/2021   Acute cystitis without hematuria 05/16/2021   Hydronephrosis of right kidney 05/16/2021   Obstructive uropathy 05/16/2021   Hypothyroidism 05/16/2021   Hyponatremia 05/16/2021   Obesity (BMI 30-39.9) 05/16/2021   Goals of care, counseling/discussion 03/01/2021   SIRS (systemic inflammatory response syndrome) ([place]) 01/13/2021   Pyelonephritis 01/13/2021   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   Genetic testing 01/27/2020   Family history of breast cancer    Family history of lung cancer    Family history of ovarian cancer    Family history of non-Hodgkin's lymphoma    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   Port catheter in place 11/21/2016   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   Anal cancer ([place]) 11/03/2016   Cystitis 05/06/2015   Degenerative cervical disc 04/08/2015   Trigger point of neck 03/26/2015   Pain in joint, ankle and foot 04/23/2013   Visit for preventive health examination 02/13/2013   Routine gynecological examination 02/13/2013   Family history of breast cancer in first degree relative 02/13/2013   Rash and nonspecific skin eruption 02/13/2013   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   VITAMIN D DEFICIENCY 06/25/2008   NECK PAIN 06/02/2008   FOOT PAIN 06/02/2008   Osteopenia 06/02/2008    REFERRING DIAG:  M25.511,G89.29 (ICD-10-CM) - Chronic right shoulder pain   THERAPY DIAG:  Stiffness of right shoulder, not elsewhere classified  Chronic right shoulder pain  Muscle weakness (generalized)  Localized edema  Abnormal posture  PERTINENT HISTORY: Arthritis Bowen's diseaseexcised 1992 Chronic low back pain Chronic radiation cystitis DDD (degenerative disc disease), cervical Family history of breast cancer Family history of lung cancer Family history of non-Hodgkin's lymphoma Family history of ovarian cancer Headache Hx of varicella Hydronephrosis of right kidney rectal cancer   PRECAUTIONS: Other: infusion therapy for Cancer , fall precuations   SUBJECTIVE:  Pt stating she has been swimming and believes it has aggravated her shoulder. 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   PAIN:  Are you having pain? 2/10 NPRS scale: 7/10 with movements and trying to reach face or certain movements Pain location: anterior shoulder and lateral Rt shoulder into elbow Pain orientation: Right  PAIN TYPE: aching Pain description: constant  Aggravating factors: reaching upward, lateral movements Relieving factors: resting and heat    OBJECTIVE:      PATIENT SURVEYS:  FOTO: 51% on 10/19/2021 FOTO: 53% on 01/04/2022 FOTO: 54.3% 03/14/2022  UPPER EXTREMITY AROM/PROM:   Active ROM  In supine Right 10/19/2021 Left 10/19/2021 Rt  02/15/22 Rt 03/01/2022  Rt 03/14/22  Shoulder flexion 120 164 130 140 142  Shoulder extension 30 45   50     Shoulder abduction 98 160 125  130  Shoulder adduction         Shoulder internal rotation 75 shoulder abd 45 degrees 75 75 Shoulder abd 45 deg  75  Shoulder abd 45 deg  Shoulder external rotation 15 Shoulder abd 45 degrees 80 10 Shoulder abd 45 deg  0 Shoulder abd 45 deg  Elbow flexion 140   145 145    Elbow extension 0 0 0    Wrist flexion         Wrist extension         Wrist ulnar deviation          Wrist radial deviation         Wrist pronation         Wrist supination         (Blank rows = not tested)   Passive ROM In degrees Right 10/19/21 Right 11/09/21 Right  11/16/21 Right 12/06/21 Rt 12/13/21 Rt 01/04/22 supine Rt 02/15/22 supine Rt 03/14/22 Rt 04/12/22 supine  Shoulder flexion 130  120 134 145 145 155 160  154  Shoulder extension 35           Shoulder abduction 105 104  110 120 120 150 155  125  Shoulder adduction             Shoulder internal rotation 80 shoulder abd 45 degrees    80 shoulder  abducted 45 degrees 78 shoulder  abducted 45 degrees 75 degrees (Shoulder abd 45 degrees) 75 deg Shoulder abd 45 deg 75 Shoulder abd 45 deg 72 Shoulder abd 45 deg  Shoulder external rotation 20 Shoulder abd 45 degrees 30 Shoulder abducted to 45 degrees   30 Shoulder abducted to 45 degrees 30 Shoulder abducted to 45 degrees 28 degrees Shoulder abd 45 degrees) 30 deg Shoulder abd 45 deg 10  Shoulder abd 45 deg 0 Shoulder abd 45 deg  Elbow flexion             Elbow extension             Wrist flexion             Wrist extension             Wrist ulnar deviation             Wrist radial deviation             Wrist pronation             Wrist supination                 UPPER EXTREMITY MMT:   MMT Right 10/19/2021 Left 10/19/2021 Right 12/06/2021 Rt 12/28/2021 Rt 03/07/22 Rt 03/14/2022  Shoulder flexion 2+/5 4+/5 3-/5  3/5 3+/5 3+/5  Shoulder extension 2+/5 4+/5 3-/5 3/5 3+/5 3+/5  Shoulder abduction 2+/5 4+/5 3-/5 3/5 3+/5 3+/5  Shoulder adduction          Shoulder internal rotation 2+/5 4+/5 3-/5 3/5 3+/5 3+/5  Shoulder external rotation 2+/5 4+/5 3-/5 3/5 3+/5 2+/5 tested at 0 deg  Middle trapezius          Lower trapezius          Elbow flexion          Elbow extension          Wrist flexion          Wrist extension          Wrist ulnar deviation          Wrist radial deviation          Wrist pronation          Wrist supination          Grip strength  (lbs) 31.8 pounds 48 pounds    29.8 pounds  38.2 pounds  (Blank rows = not tested)                  TODAY'S TREATMENT:   04/12/2022:  TherEx: Pulleys x 5 minutes, flexion and scaption Supine AAROM shoulder flexion Sidelying AAROM IR x 10 holding 3-5 seconds  Manual: PROM: Rt shoulder flexion, ER, abduction, IR, grade 2-3 AP GH mobs Modaliites Moist heat x 5 minutes at end of session   03/28/2022:  TherEx: AA flexion sitting c 1 # bar 2 x 10 to 90 degrees AA abduction sitting c 1 # bar 2 x 10 to 70 degrees Bicep curls with 3 # weight x 10 Supine AAROM ER 2 x 10 Rolling green physioball in semicircle from side to side while sitting on edge of mat x 10 Rolling green physioball forward/back x 5 for shoulder flexion Rows: Level 3 band x 20 ER: level 2 band elbow at 90 degrees walking sideways   Manual: PROM: Rt shoulder flexion, ER, abduction, grade 2-3 AP GH mobs Modaliites Moist heat x 5 minutes at end of session   03/14/2022:  TherEx: AA flexion c 1 # bar 2 x 10 to 90 degrees AA abduction c 1 # bar 2 x 10 to 70 degrees Bicep curls with 3 # weight x 10 Supine AAROM ER 2 x 10 Sidelying Abd: x 10 UE ranger scaption x 1 minute in sitting Rows: Level 2 band 2 x 10 ER: level 2 band elbow at 90 degrees walking sideways   Manual: PROM: Rt shoulder flexion, ER, abduction    03/07/2022:  TherEx: AA flexion c 1 # bar 2 x 10 to 90 degrees AA abduction c 1 # bar 2 x 10 to 70 degrees Bicep curls with 3 # weight x 10 Ball rolling up wall x 10 with bilateral UE's UE ranger scaption x 1 minute in sitting Rows: Level 2 band 2 x 10 ER: level 2 band elbow at 90 degrees walking sideways   Manual: PROM: Rt shoulder flexion, ER, abduction         PATIENT EDUCATION: Education details:added isometrics to pt's HEP  Person educated: Patient Education method: Explanation Education comprehension: verbalized understanding, returned demonstration, and verbal cues required      HOME EXERCISE PROGRAM: Access Code: [email or web address] URL: [email or web address] Date: 10/19/2021 Prepared by: Kearney Hard   Exercises Seated Shoulder Flexion Towel Slide at Table Top - 2-3 x daily - 7  x weekly - 1 sets - 10 reps - 5-10 seocnds hold Seated Shoulder External Rotation PROM on Table - 2-3 x daily - 7 x weekly - 1 sets - 10 reps - 5-10 seconds hold Seated Scapular Retraction - 2-3 x daily - 7 x weekly - 1 sets - 10 reps - 5-10 seconds hold     ASSESSMENT:   CLINICAL IMPRESSION: Pt  reporting more pain following swimming twice over the last week. Pt stating she is having more difficulty with completing ADL's. Treatment today was focuses on manual PROM and shoulder mobs due to decreased in overall passive motion. Continue skilled PT.      REHAB POTENTIAL: fair, due to co-morbidities   CLINICAL DECISION MAKING: Evolving/moderate complexity   EVALUATION COMPLEXITY: Moderate     GOALS: Goals reviewed with patient? Yes   SHORT TERM GOALS:   STG Name Target Date Goal status  1 Pt will be independent with her initial HEP.    11/16/2021 Met 11/09/2021  2 Pt will be able to perform 5 time sit to stand with no UE support </= 15 seconds Baseline:  11/16/2021 MET  01/04/2022    LONG TERM GOALS:    LTG Name Target Date Goal status  1 PT will be independent in her advanced HEP.  Baseline: 01/11/2022 MET  01/04/22  2 Pt will improve FOTO score to >/= 63 % Baseline: 53% on 01/04/2022 06/14/22 Ongoing 03/14/2022  3 Pt will improve her right shoulder ER to >/= 30 degrees to improve functional mobility.  Baseline: 06/14/22 Ongoing 03/14/2022  4 Pt will be able to report pain of </= 2/10 with ADL's using her right UE.  Baseline: pain depends on the day 01/04/22 06/14/22 On going  03/14/2022       5.   Pt will be able to reach her left under arm for donning deodorant.  06/14/22 On-going 03/14/2022                      PLAN: PT FREQUENCY: 1x/ week for 12  weeks Approved for 12 visits from 01/04/2022 -  03/11/2022 Apporoved for 12 visits from 03/11/2022 to 06/14/2022     PT DURATION: 12 weeks   PLANNED INTERVENTIONS: Therapeutic exercises, Therapeutic activity, Neuro Muscular re-education, Balance training, Gait training, Patient/Family education, Joint mobilization, Dry Needling, Cryotherapy, Moist heat, Taping, Vasopneumatic device, and Manual therapy   PLAN FOR NEXT SESSION:  AAROM/AROM gains as tolerated, strengthening   Kearney Hard, PT, MPT 04/12/22 4:12 PM

## 2022-04-14 NOTE — Telephone Encounter (Signed)
Si I see that Dr Ammie Dalton   ordered tsh  and future repeat tsh.   Before changing dose of medication Make sure you are taking thyroid medication alone just with  water   no food drinks or vitamins or other medications for at least an hour. This will make sure other substances in stomach are not interfering with thyroid medication getting into bloodstream.   Then keep the future  tsh blood appt that Dr Benay Spice has  arranged

## 2022-04-17 ENCOUNTER — Other Ambulatory Visit: Payer: Self-pay | Admitting: Oncology

## 2022-04-18 ENCOUNTER — Ambulatory Visit: Payer: Medicare HMO | Admitting: Physical Therapy

## 2022-04-18 ENCOUNTER — Encounter: Payer: Self-pay | Admitting: Physical Therapy

## 2022-04-18 DIAGNOSIS — R293 Abnormal posture: Secondary | ICD-10-CM

## 2022-04-18 DIAGNOSIS — M25511 Pain in right shoulder: Secondary | ICD-10-CM

## 2022-04-18 DIAGNOSIS — R600 Localized edema: Secondary | ICD-10-CM

## 2022-04-18 DIAGNOSIS — M6281 Muscle weakness (generalized): Secondary | ICD-10-CM | POA: Diagnosis not present

## 2022-04-18 DIAGNOSIS — M25611 Stiffness of right shoulder, not elsewhere classified: Secondary | ICD-10-CM

## 2022-04-18 DIAGNOSIS — G8929 Other chronic pain: Secondary | ICD-10-CM | POA: Diagnosis not present

## 2022-04-18 NOTE — Therapy (Addendum)
OUTPATIENT PHYSICAL THERAPY TREATMENT NOTE RE-CERTIFICATION         Patient Name: Ashley Moore MRN: [id number] DOB:1951/05/24, 71 y.o., female Today's Date: 04/18/2022  PCP: [name], MD REFERRING PROVIDER: No ref. provider found    PT End of Session - 04/18/22 1445     Visit Number 18    Number of Visits 22    Date for PT Re-Evaluation 06/14/22    Authorization Type [employer] 12 visits 4/18 - 03/11/2022    Authorization Time Period 12 visits 4/18 - 03/11/2022, resubmitted on 03/14/22 for 12 more visits through 06/14/22    Progress Note Due on Visit 20    PT Start Time 1430    PT Stop Time 1515    PT Time Calculation (min) 45 min    Activity Tolerance Patient limited by pain    Behavior During Therapy [facility] for tasks assessed/performed                 PT End of Session - 04/18/22 1445     Visit Number 18    Number of Visits 22    Date for PT Re-Evaluation 06/14/22    Authorization Type HU[employer] 12 visits 4/18 - 03/11/2022    Authorization Time Period 12 visits 4/18 - 03/11/2022, resubmitted on 03/14/22 for 12 more visits through 06/14/22    Progress Note Due on Visit 20    PT Start Time 1430    PT Stop Time 1515    PT Time Calculation (min) 45 min    Activity Tolerance Patient limited by pain    Behavior During Therapy [facility] for tasks assessed/performed                       Past Medical History:  Diagnosis Date   Arthritis    Bowen's disease    excised 1992   Chronic low back pain    Chronic radiation cystitis    DDD (degenerative disc disease), cervical    Family history of breast cancer    Family history of lung cancer    Family history of non-Hodgkin's lymphoma    Family history of ovarian cancer    Headache    Hx of varicella    Hydronephrosis of right kidney    urologist--- dr Tresa Moore,  malignant,  treated with ureter stent   Hypothyroidism    followed by pcp   Lower urinary tract symptoms (LUTS)    01-12-2021  per pt treated with  accupuncture at dr Tresa Moore office   Lymphedema of both lower extremities    PICC (peripherally inserted central catheter) in place 01/11/2021   placed at [facility] in [place], [place] for IV antibiotics   Positive blood culture 01/08/2021   Klebsiella Pneumaniae,  treated with daily IV antibiotic   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[facility]) oncologist--- dr Benay Spice   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) 01/07/2021   pt admitted to [facility] in [place],  dx sepsis secondary to acute pyelonephritis with bacteremia , positive blood culture,  discharged 01-11-2021 home daily IV antibiotic   Wears glasses    Past Surgical History:  Procedure Laterality Date   BUNIONECTOMY Right yrs ago   CYSTOSCOPY W/ URETERAL  STENT PLACEMENT Right 06/12/2020   Procedure: CYSTOSCOPY WITH RETROGRADE PYELOGRAM/URETERAL STENT EXCHANGE;  Surgeon: Alexis Frock, MD;  Location: [facility];  Service: Urology;  Laterality: Right;   CYSTOSCOPY W/ URETERAL STENT PLACEMENT Right 01/13/2021   Procedure: CYSTOSCOPY WITH RETROGRADE PYELOGRAM/URETERAL STENT REMOVALRIGHT;  Surgeon: Alexis Frock, MD;  Location: [facility];  Service: Urology;  Laterality: Right;  [place], URETEROSCOPY AND STENT PLACEMENT Right 01/28/2020   Procedure: CYSTOSCOPY WITH RETROGRADE PYELOGRAM, URETEROSCOPY AND STENT PLACEMENT;  Surgeon: Alexis Frock, MD;  Location: [facility];  Service: Urology;  Laterality: Right;   IR GENERIC HISTORICAL  11/14/2016   IR US GUIDE VASC ACCESS RIGHT 11/14/2016 [facility]   IR GENERIC HISTORICAL  11/14/2016   IR FLUORO GUIDE CV LINE RIGHT  11/14/2016 [facility]   IR GENERIC HISTORICAL  12/12/2016   IR US GUIDE VASC ACCESS RIGHT 12/12/2016 [name], MD [facility]   IR GENERIC HISTORICAL  12/12/2016   IR FLUORO GUIDE CV LINE RIGHT 12/12/2016 [name], MD [facility]   SKIN CANCER EXCISION  1990   bowens disease   Patient Active Problem List   Diagnosis Date Noted   Primary osteoarthritis, 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/28/2021   Atypical chest pain 06/29/2021   Intractable abdominal pain 05/16/2021   Acute pyelonephritis 05/16/2021   Acute cystitis without hematuria 05/16/2021   Hydronephrosis of right kidney 05/16/2021   Obstructive uropathy 05/16/2021   Hypothyroidism 05/16/2021   Hyponatremia 05/16/2021   Obesity (BMI 30-39.9) 05/16/2021   Goals of care, counseling/discussion 03/01/2021   SIRS (systemic inflammatory response syndrome) ([place]) 01/13/2021   Pyelonephritis 01/13/2021   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   Genetic testing 01/27/2020   Family history of breast cancer    Family history of lung cancer    Family history of ovarian cancer    Family history of non-Hodgkin's lymphoma    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   Port catheter in place 11/21/2016   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   Anal cancer ([place]) 11/03/2016   Cystitis 05/06/2015   Degenerative cervical disc 04/08/2015   Trigger point of neck 03/26/2015   Pain in joint, ankle and foot 04/23/2013   Visit for preventive health examination 02/13/2013   Routine gynecological examination 02/13/2013   Family history of breast cancer in first degree relative 02/13/2013   Rash and nonspecific skin eruption 02/13/2013   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   VITAMIN D DEFICIENCY 06/25/2008   NECK PAIN 06/02/2008   FOOT PAIN 06/02/2008   Osteopenia 06/02/2008    REFERRING DIAG: M25.511,G89.29 (ICD-10-CM) - Chronic right shoulder pain   THERAPY DIAG:  Stiffness of right shoulder, not elsewhere  classified  Muscle weakness (generalized)  Chronic right shoulder pain  Localized edema  Abnormal posture  PERTINENT HISTORY: Arthritis Bowen's diseaseexcised 1992 Chronic low back pain Chronic radiation cystitis DDD (degenerative disc disease), cervical Family history of breast cancer Family history of lung cancer Family history of non-Hodgkin's lymphoma Family history of ovarian cancer Headache Hx of varicella Hydronephrosis of right kidney rectal cancer   PRECAUTIONS: Other: infusion therapy for Cancer , fall precuations   SUBJECTIVE:  Pt stating she has been working more, but hasn't had a chance to get back in the pool. Pt did report going to sit in hot tub.  PAIN:  Are you having pain? yes, NPRS scale: pain stops my motion and can increase to 4-5/10 Pain location: anterior shoulder and lateral Rt  shoulder into elbow Pain orientation: Right  PAIN TYPE: aching Pain description: constant  Aggravating factors: reaching upward, lateral movements Relieving factors: resting and heat    OBJECTIVE:      PATIENT SURVEYS:  FOTO: 51% on 10/19/2021 FOTO: 53% on 01/04/2022 FOTO: 54.3% 03/14/2022  UPPER EXTREMITY AROM/PROM:   Active ROM  In supine Right 10/19/2021 Left 10/19/2021 Rt  02/15/22 Rt 03/01/2022  Rt 03/14/22 Rt 04/18/22 supine  Shoulder flexion 120 164 130 140 142 142  Shoulder extension 30 45   50      Shoulder abduction 98 160 125  130 130  Shoulder adduction          Shoulder internal rotation 75 shoulder abd 45 degrees 75 75 Shoulder abd 45 deg  75  Shoulder abd 45 deg 75 Shoulder abd 45 deg  Shoulder external rotation 15 Shoulder abd 45 degrees 80 10 Shoulder abd 45 deg  0 Shoulder abd 45 deg 0  Shoulder abd 45 deg   Elbow flexion 140   145 145     Elbow extension 0 0 0     Wrist flexion          Wrist extension          Wrist ulnar deviation          Wrist radial deviation          Wrist pronation          Wrist supination          (Blank rows =  not tested)   Passive ROM In degrees Right 10/19/21 Right 11/09/21 Right  11/16/21 Right 12/06/21 Rt 12/13/21 Rt 01/04/22 supine Rt 02/15/22 supine Rt 03/14/22 Rt 04/12/22 supine Rt 04/18/22 supine  Shoulder flexion 130  120 134 145 145 155 160  154 152  Shoulder extension 35            Shoulder abduction 105 104  110 120 120 150 155  125 124  Shoulder adduction              Shoulder internal rotation 80 shoulder abd 45 degrees    80 shoulder  abducted 45 degrees 78 shoulder  abducted 45 degrees 75 degrees (Shoulder abd 45 degrees) 75 deg Shoulder abd 45 deg 75 Shoulder abd 45 deg 72 Shoulder abd 45 deg 72  Shoulder external rotation 20 Shoulder abd 45 degrees 30 Shoulder abducted to 45 degrees   30 Shoulder abducted to 45 degrees 30 Shoulder abducted to 45 degrees 28 degrees Shoulder abd 45 degrees) 30 deg Shoulder abd 45 deg 10  Shoulder abd 45 deg 0 Shoulder abd 45 deg 5  Shoulder abd  45 deg  Elbow flexion              Elbow extension              Wrist flexion              Wrist extension              Wrist ulnar deviation              Wrist radial deviation              Wrist pronation              Wrist supination                  UPPER EXTREMITY MMT:   MMT Right 10/19/2021 Left 10/19/2021 Right 12/06/2021 Rt 12/28/2021  Rt 03/07/22 Rt 03/14/2022  Shoulder flexion 2+/5 4+/5 3-/5 3/5 3+/5 3+/5  Shoulder extension 2+/5 4+/5 3-/5 3/5 3+/5 3+/5  Shoulder abduction 2+/5 4+/5 3-/5 3/5 3+/5 3+/5  Shoulder adduction          Shoulder internal rotation 2+/5 4+/5 3-/5 3/5 3+/5 3+/5  Shoulder external rotation 2+/5 4+/5 3-/5 3/5 3+/5 2+/5 tested at 0 deg  Middle trapezius          Lower trapezius          Elbow flexion          Elbow extension          Wrist flexion          Wrist extension          Wrist ulnar deviation          Wrist radial deviation          Wrist pronation          Wrist supination          Grip strength (lbs) 31.8 pounds 48 pounds    29.8  pounds  38.2 pounds  (Blank rows = not tested)                  TODAY'S TREATMENT:   04/18/2022:  TherEx: Pulleys x 5 minutes, flexion and scaption UBE: 4 minutes forward and 3 minutes backward at Level 2.5 Supine AAROM shoulder flexion Supine shoulder presses with 3# bar 2 x 10 Sidelying Abduction x 15  Manual: PROM: Rt shoulder flexion, ER, abduction, IR, grade 2-3 AP GH mobs Modaliites Moist heat x 5 minutes at end of session    04/12/2022:  TherEx: Pulleys x 5 minutes, flexion and scaption Supine AAROM shoulder flexion Sidelying AAROM IR x 10 holding 3-5 seconds  Manual: PROM: Rt shoulder flexion, ER, abduction, IR, grade 2-3 AP GH mobs Modaliites Moist heat x 5 minutes at end of session   03/28/2022:  TherEx: AA flexion sitting c 1 # bar 2 x 10 to 90 degrees AA abduction sitting c 1 # bar 2 x 10 to 70 degrees Bicep curls with 3 # weight x 10 Supine AAROM ER 2 x 10 Rolling green physioball in semicircle from side to side while sitting on edge of mat x 10 Rolling green physioball forward/back x 5 for shoulder flexion Rows: Level 3 band x 20 ER: level 2 band elbow at 90 degrees walking sideways   Manual: PROM: Rt shoulder flexion, ER, abduction, grade 2-3 AP GH mobs Modaliites Moist heat x 5 minutes at end of session   03/14/2022:  TherEx: AA flexion c 1 # bar 2 x 10 to 90 degrees AA abduction c 1 # bar 2 x 10 to 70 degrees Bicep curls with 3 # weight x 10 Supine AAROM ER 2 x 10 Sidelying Abd: x 10 UE ranger scaption x 1 minute in sitting Rows: Level 2 band 2 x 10 ER: level 2 band elbow at 90 degrees walking sideways   Manual: PROM: Rt shoulder flexion, ER, abduction         PATIENT EDUCATION: Education details:added isometrics to pt's HEP  Person educated: Patient Education method: Explanation Education comprehension: verbalized understanding, returned demonstration, and verbal cues required     HOME EXERCISE PROGRAM: Access Code:  [email or web address] URL: [email or web address] Date: 10/19/2021 Prepared by: Kearney Hard   Exercises Seated Shoulder Flexion Towel Slide at Table Top - 2-3 x daily - 7 x weekly - 1 sets -  10 reps - 5-10 seocnds hold Seated Shoulder External Rotation PROM on Table - 2-3 x daily - 7 x weekly - 1 sets - 10 reps - 5-10 seconds hold Seated Scapular Retraction - 2-3 x daily - 7 x weekly - 1 sets - 10 reps - 5-10 seconds hold     ASSESSMENT:   CLINICAL IMPRESSION: Pt still reporting pain with reaching activities, and less pain at rest since beginning PT. Pt has made improvements in Passive ROM in her Rt shoulder. Due to limitations from her Infusion therapy she is limited to once a week. I am requesting an extension of 6 weeks to complete her therapy plan. Recommending continue skilled PT to progress toward LTG's not met.      REHAB POTENTIAL: fair, due to co-morbidities   CLINICAL DECISION MAKING: Evolving/moderate complexity   EVALUATION COMPLEXITY: Moderate     GOALS: Goals reviewed with patient? Yes   SHORT TERM GOALS:   STG Name Target Date Goal status  1 Pt will be independent with her initial HEP.    11/16/2021 Met 11/09/2021  2 Pt will be able to perform 5 time sit to stand with no UE support </= 15 seconds Baseline:  11/16/2021 MET  01/04/2022    LONG TERM GOALS:    LTG Name Target Date Goal status  1 PT will be independent in her advanced HEP.  Baseline: 01/11/2022 MET  01/04/22  2 Pt will improve FOTO score to >/= 63 % Baseline: 53% on 01/04/2022 06/14/22 Ongoing 03/14/2022  3 Pt will improve her right shoulder ER to >/= 30 degrees to improve functional mobility.  Baseline: 06/14/22 Ongoing 03/14/2022  4 Pt will be able to report pain of </= 2/10 with ADL's using her right UE.  Baseline: pain depends on the day 01/04/22 06/14/22 On going  03/14/2022       5.   Pt will be able to reach her left under arm for donning deodorant.  06/14/22 On-going 03/14/2022                       PLAN: PT FREQUENCY: 1x/ week for 12 weeks Approved for 12 visits from 01/04/2022 -  03/11/2022 from [place] for 12 visits from 03/11/2022 to 06/14/2022 from [place] sent on 03/19/09     PT DURATION:  1 x/ week x 6 weeks   PLANNED INTERVENTIONS: Therapeutic exercises, Therapeutic activity, Neuro Muscular re-education, Balance training, Gait training, Patient/Family education, Joint mobilization, Dry Needling, Cryotherapy, Moist heat, Taping, Vasopneumatic device, and Manual therapy   PLAN FOR NEXT SESSION:  AAROM/AROM gains as tolerated, strengthening   Kearney Hard, PT, MPT 04/18/22 3:17 PM

## 2022-04-20 ENCOUNTER — Other Ambulatory Visit: Payer: Self-pay

## 2022-04-20 ENCOUNTER — Encounter (HOSPITAL_BASED_OUTPATIENT_CLINIC_OR_DEPARTMENT_OTHER): Payer: Self-pay | Admitting: Pharmacy Technician

## 2022-04-20 ENCOUNTER — Emergency Department (HOSPITAL_BASED_OUTPATIENT_CLINIC_OR_DEPARTMENT_OTHER)
Admission: EM | Admit: 2022-04-20 | Discharge: 2022-04-20 | Disposition: A | Discharge status: Home / Self Care | Payer: Medicare HMO | Attending: Emergency Medicine | Admitting: Emergency Medicine

## 2022-04-20 ENCOUNTER — Emergency Department (HOSPITAL_BASED_OUTPATIENT_CLINIC_OR_DEPARTMENT_OTHER): Payer: Medicare HMO | Admitting: Radiology

## 2022-04-20 DIAGNOSIS — Z85048 Personal history of other malignant neoplasm of rectum, rectosigmoid junction, and anus: Secondary | ICD-10-CM | POA: Insufficient documentation

## 2022-04-20 DIAGNOSIS — X501XXA Overexertion from prolonged static or awkward postures, initial encounter: Secondary | ICD-10-CM | POA: Diagnosis not present

## 2022-04-20 DIAGNOSIS — S99912A Unspecified injury of left ankle, initial encounter: Secondary | ICD-10-CM | POA: Diagnosis present

## 2022-04-20 DIAGNOSIS — M25572 Pain in left ankle and joints of left foot: Secondary | ICD-10-CM | POA: Diagnosis not present

## 2022-04-20 DIAGNOSIS — S93402A Sprain of unspecified ligament of left ankle, initial encounter: Secondary | ICD-10-CM | POA: Insufficient documentation

## 2022-04-20 DIAGNOSIS — Y93K9 Activity, other involving animal care: Secondary | ICD-10-CM | POA: Insufficient documentation

## 2022-04-20 DIAGNOSIS — E039 Hypothyroidism, unspecified: Secondary | ICD-10-CM | POA: Diagnosis not present

## 2022-04-20 DIAGNOSIS — Z79899 Other long term (current) drug therapy: Secondary | ICD-10-CM | POA: Insufficient documentation

## 2022-04-20 DIAGNOSIS — M79673 Pain in unspecified foot: Secondary | ICD-10-CM | POA: Diagnosis not present

## 2022-04-20 DIAGNOSIS — M7732 Calcaneal spur, left foot: Secondary | ICD-10-CM | POA: Diagnosis not present

## 2022-04-20 DIAGNOSIS — R609 Edema, unspecified: Secondary | ICD-10-CM | POA: Diagnosis not present

## 2022-04-20 NOTE — ED Notes (Signed)
Patient Alert and oriented to baseline. Stable and ambulatory to baseline. Patient verbalized understanding of the discharge instructions.  Patient belongings were taken by the patient.   

## 2022-04-20 NOTE — Discharge Instructions (Addendum)
You were seen emergency department today for left foot/ankle pain.  As we discussed I think you likely sprained your left ankle.  We have wrapped this for you and given you some crutches.  The treatment for this is normally the RICE method-rest, ice, compression, and elevation.  You can use ibuprofen or Tylenol as needed for pain.  Return to activity as tolerated.

## 2022-04-20 NOTE — ED Provider Notes (Signed)
[place] EMERGENCY DEPT Provider Note   CSN: [id number] Arrival date & time: 04/20/22  1238     History  Chief Complaint  Patient presents with   Foot Pain    Ashley Moore is a 71 y.o. female who presents the emergency department complaining of left ankle pain.  Patient states that she believes she initially hurt her ankle the other day while walking.  Today she was out feeding her animals and felt a "pop" in her left ankle with shooting pain up her leg.  Now she reports that the ankle is swollen and she is having difficulty bearing weight.  No other trauma noted.   Foot Pain       Home Medications Prior to Admission medications   Medication Sig Start Date End Date Taking? Authorizing Provider  acetaminophen (TYLENOL) 500 MG tablet Take 1,000 mg by mouth 2 (two) times daily.    [provider]  Ascorbic Acid (VITAMIN C) 1000 MG tablet Take 1,000 mg by mouth daily.    [provider]  azelastine (ASTELIN) 0.1 % nasal spray Place 2 sprays into both nostrils 2 (two) times daily. Use in each nostril as directed 10/25/21   Panosh, Standley Brooking, MD  Cholecalciferol (VITAMIN D3 PO) Take 10,000 Units by mouth daily.    [provider]  conjugated estrogens (PREMARIN) vaginal cream Place 1 Applicatorful vaginally daily.    [provider]  diclofenac Sodium (VOLTAREN) 1 % GEL Apply 2 g topically daily. To shoulder 03/04/21   [provider]  levothyroxine (SYNTHROID) 75 MCG tablet TAKE 1 TABLET EVERY DAY 01/12/22   Panosh, Standley Brooking, MD  loratadine (CLARITIN) 10 MG tablet Take 10 mg by mouth daily.    [provider]  LORazepam (ATIVAN) 0.5 MG tablet Take 1 tablet (0.5 mg total) by mouth every 8 (eight) hours as needed for anxiety (as needed). 01/12/22   [name], NP  MYRBETRIQ 50 MG TB24 tablet Take 50 mg by mouth daily. 01/19/21   [provider]  OVER THE COUNTER MEDICATION Place 1 drop into both eyes daily  as needed (dry eyes).    [provider]  OVER THE COUNTER MEDICATION Gundry MD Total Restore    [provider]  Prenatal Vit-Fe Fumarate-FA (PRENATAL PO) Take by mouth daily.    [provider]  protein supplement shake (PREMIER PROTEIN) LIQD Take 11 oz by mouth daily.    [provider]  TURMERIC PO Take 800 mg by mouth in the morning and at bedtime.    [provider]      Allergies    Sulfamethoxazole-trimethoprim, Benadryl [diphenhydramine], Melatonin, and Penicillins    Review of Systems   Review of Systems  Musculoskeletal:  Positive for arthralgias.  All other systems reviewed and are negative.   Physical Exam Updated Vital Signs BP (!) 144/76   Pulse 76   Temp 98.2 F (36.8 C)   Resp 16   SpO2 99%  Physical Exam Vitals and nursing note reviewed.  Constitutional:      Appearance: Normal appearance.  HENT:     Head: Normocephalic and atraumatic.  Eyes:     Conjunctiva/sclera: Conjunctivae normal.  Cardiovascular:     Pulses:          Dorsalis pedis pulses are 2+ on the right side and 2+ on the left side.  Pulmonary:     Effort: Pulmonary effort is normal. No respiratory distress.  Musculoskeletal:  Comments: Swelling noted to the medial malleoli, without deformity palpated.  No ecchymoses.  No tenderness over the Achilles tendon.  Skin:    General: Skin is warm and dry.  Neurological:     Mental Status: She is alert.  Psychiatric:        Mood and Affect: Mood normal.        Behavior: Behavior normal.     ED Results / Procedures / Treatments   Labs (all labs ordered are listed, but only abnormal results are displayed) Labs Reviewed - No data to display  EKG None  Radiology DG Foot Complete Left  Result Date: 04/20/2022 CLINICAL DATA:  Trauma, pain EXAM: LEFT FOOT - COMPLETE 3+ VIEW COMPARISON:  None Available. FINDINGS: No recent fracture or dislocation is seen. Small bony spurs are noted in the dorsal  aspect of intertarsal and tarsometatarsal joints. There are no opaque foreign bodies. IMPRESSION: No recent fracture or dislocation is seen left foot. Electronically Signed   By: Elmer Picker M.D.   On: 04/20/2022 13:40    Procedures Procedures    Medications Ordered in ED Medications - No data to display  ED Course/ Medical Decision Making/ A&P                           Medical Decision Making Amount and/or Complexity of Data Reviewed Radiology: ordered.   Patient is a 71 year old female with history of degenerative disc disease, hypothyroidism, rectal cancer, and chronic low back pain who presents the emergency department complaining of left ankle pain.  On exam patient is neurovascularly intact in bilateral lower extremities.  There are some swelling noted to the medial malleoli of the left ankle, without deformity.  X-ray performed shows no acute fractures dislocations.  I viewed the x-ray and agree with the radiologist interpretation.  Placed patient in an Ace wrap and will treat with RICE method for likely left ankle sprain.  Patient given crutches for comfort.  Recommended follow-up with orthopedics if symptoms do not improve.  Patient is stable for discharge, and is agreeable to the plan.        Final Clinical Impression(s) / ED Diagnoses Final diagnoses:  Sprain of left ankle, unspecified ligament, initial encounter    Rx / DC Orders ED Discharge Orders     None      Portions of this report may have been transcribed using voice recognition software. Every effort was made to ensure accuracy; however, inadvertent computerized transcription errors may be present.    Estill Cotta 04/20/22 1611    Margette Fast, MD 04/25/22 1649

## 2022-04-20 NOTE — ED Triage Notes (Signed)
Pt here with reports of stepping on something several days ago and having pain since in L foot. Today she stepped down and shooting pain up LLE. Pt states she heard a "pop". Now + edema and unable to bear weight.

## 2022-04-21 ENCOUNTER — Inpatient Hospital Stay: Payer: Medicare HMO | Admitting: Nurse Practitioner

## 2022-04-21 ENCOUNTER — Encounter: Payer: Self-pay | Admitting: Nurse Practitioner

## 2022-04-21 ENCOUNTER — Inpatient Hospital Stay: Payer: Medicare HMO | Attending: Nurse Practitioner

## 2022-04-21 ENCOUNTER — Encounter: Payer: Self-pay | Admitting: *Deleted

## 2022-04-21 ENCOUNTER — Inpatient Hospital Stay: Payer: Medicare HMO

## 2022-04-21 VITALS — BP 129/76 | HR 73 | Temp 98.1°F | Resp 18 | Ht 62.0 in

## 2022-04-21 VITALS — BP 139/84 | HR 68

## 2022-04-21 DIAGNOSIS — C210 Malignant neoplasm of anus, unspecified: Secondary | ICD-10-CM | POA: Insufficient documentation

## 2022-04-21 DIAGNOSIS — C772 Secondary and unspecified malignant neoplasm of intra-abdominal lymph nodes: Secondary | ICD-10-CM

## 2022-04-21 DIAGNOSIS — Z79899 Other long term (current) drug therapy: Secondary | ICD-10-CM | POA: Diagnosis not present

## 2022-04-21 DIAGNOSIS — Z5112 Encounter for antineoplastic immunotherapy: Secondary | ICD-10-CM | POA: Diagnosis not present

## 2022-04-21 LAB — CMP (CANCER CENTER ONLY)
ALT: 10 U/L (ref 0–44)
AST: 24 U/L (ref 15–41)
Albumin: 4 g/dL (ref 3.5–5.0)
Alkaline Phosphatase: 55 U/L (ref 38–126)
Anion gap: 12 (ref 5–15)
BUN: 20 mg/dL (ref 8–23)
CO2: 22 mmol/L (ref 22–32)
Calcium: 9.8 mg/dL (ref 8.9–10.3)
Chloride: 104 mmol/L (ref 98–111)
Creatinine: 0.82 mg/dL (ref 0.44–1.00)
GFR, Estimated: >60 mL/min (ref >60)
Glucose, Bld: 76 mg/dL (ref 70–99)
Potassium: 4.4 mmol/L (ref 3.5–5.1)
Sodium: 138 mmol/L (ref 135–145)
Total Bilirubin: 0.3 mg/dL (ref 0.3–1.2)
Total Protein: 6.9 g/dL (ref 6.5–8.1)

## 2022-04-21 LAB — CBC WITH DIFFERENTIAL (CANCER CENTER ONLY)
Abs Immature Granulocytes: 0.02 10*3/uL (ref 0.00–0.07)
Basophils Absolute: 0 10*3/uL (ref 0.0–0.1)
Basophils Relative: 1 %
Eosinophils Absolute: 0.1 10*3/uL (ref 0.0–0.5)
Eosinophils Relative: 3 %
HCT: 34.5 % — ABNORMAL LOW (ref 36.0–46.0)
Hemoglobin: 11.2 g/dL — ABNORMAL LOW (ref 12.0–15.0)
Immature Granulocytes: 1 %
Lymphocytes Relative: 14 %
Lymphs Abs: 0.5 10*3/uL — ABNORMAL LOW (ref 0.7–4.0)
MCH: 28.5 pg (ref 26.0–34.0)
MCHC: 32.5 g/dL (ref 30.0–36.0)
MCV: 87.8 fL (ref 80.0–100.0)
Monocytes Absolute: 0.3 10*3/uL (ref 0.1–1.0)
Monocytes Relative: 10 %
Neutro Abs: 2.2 10*3/uL (ref 1.7–7.7)
Neutrophils Relative %: 71 %
Platelet Count: 135 10*3/uL — ABNORMAL LOW (ref 150–400)
RBC: 3.93 MIL/uL (ref 3.87–5.11)
RDW: 14.8 % (ref 11.5–15.5)
WBC Count: 3.1 10*3/uL — ABNORMAL LOW (ref 4.0–10.5)
nRBC: 0 % (ref 0.0–0.2)

## 2022-04-21 LAB — TSH: TSH: 2.78 u[IU]/mL (ref 0.350–4.500)

## 2022-04-21 MED ORDER — SODIUM CHLORIDE 0.9 % IV SOLN: Freq: Once | INTRAVENOUS | Status: AC

## 2022-04-21 MED ORDER — SODIUM CHLORIDE 0.9 % IV SOLN
480.0000 mg | Freq: Once | INTRAVENOUS | Status: AC
  Administered 2022-04-21: 480 mg via INTRAVENOUS
  Filled 2022-04-21: qty 48

## 2022-04-21 NOTE — Progress Notes (Signed)
[facility] OFFICE PROGRESS NOTE   Diagnosis: Anal cancer  INTERVAL HISTORY:   Ashley Moore returns as scheduled.  She completed another cycle of nivolumab 03/24/2022.  No rash or diarrhea.  No nausea or vomiting.  Good appetite.  She was seen in the emergency department yesterday for probable left ankle sprain.  Objective:  Vital signs in last 24 hours:  Blood pressure 129/76, pulse 73, temperature 98.1 F (36.7 C), temperature source Oral, resp. rate 18, height '5\' 2"'$  (1.575 m), SpO2 100 %.    HEENT: No thrush or ulcers. Resp: Fine rales at both lung bases.  No respiratory distress. Cardio: Regular rate and rhythm. GI: No hepatosplenomegaly. Vascular: Trace lower leg edema bilaterally. Skin: No rash.   Lab Results:  Lab Results  Component Value Date   WBC 4.1 03/24/2022   HGB 11.5 (L) 03/24/2022   HCT 37.0 03/24/2022   MCV 91.8 03/24/2022   PLT 207 03/24/2022   NEUTROABS 2.8 03/24/2022    Imaging:  DG Foot Complete Left  Result Date: 04/20/2022 CLINICAL DATA:  Trauma, pain EXAM: LEFT FOOT - COMPLETE 3+ VIEW COMPARISON:  None Available. FINDINGS: No recent fracture or dislocation is seen. Small bony spurs are noted in the dorsal aspect of intertarsal and tarsometatarsal joints. There are no opaque foreign bodies. IMPRESSION: No recent fracture or dislocation is seen left foot. Electronically Signed   By: Elmer Picker M.D.   On: 04/20/2022 13:40    Medications: I have reviewed the patient's current medications.  Assessment/Plan: Anal cancer CT abdomen/pelvis 10/21/2016-thickening of the anus extending to the junction between the anus and rectum with a large stool ball in the rectum and mild fat stranding posterior to the rectum. Enlarged lymph nodes in the right and left inguinal regions. A few mildly prominent nodes seen posterior to the rectum. 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 PET scan [id number]  hypermetabolic anal mass with hypermetabolic metastases to the groin region bilaterally, left pelvic sidewall and presacral space. Initiation of radiation and cycle 1 5-FU/mitomycin C 11/14/2016 Cycle 2 5-FU/mitomycin C 12/12/2016 (5-FU dose reduced due to mucositis, diarrhea, skin breakdown) Radiation completed 12/23/2016 CT abdomen/pelvis 04/14/2017-resolution of anal mass and bilateral inguinal lymphadenopathy. No residual tumor seen. CT abdomen/pelvis 01/28/2020-right hydroureteronephrosis, transition at the level of the mid right ureter, retroperitoneal lymphadenopathy CT renal stone study 01/28/2020-severe right hydronephrosis and proximal right hydroureter, 8 mm area of increased attenuation in the proximal right ureter-stone versus soft tissue mass PET 02/13/2020-hypermetabolic right retroperitoneal nodal metastases in the aortocaval and posterior pericaval chains, nonspecific mild anal wall hypermetabolism, right nephroureteral stent 02/21/2020 anorectal exam per Dr. Ashley Jacobs radiation changes of the skin around the anal margin.  Posterior midline smooth scarring.  Mildly stenotic.  Stenosis seems better.  No palpable concerning lesions.  Entire anal canal and distal rectum feels smooth and healthy.  Partial anoscopy performed with no lesions in the anal canal. Xeloda/radiation beginning 03/02/2020, completed 04/13/2020 CT abdomen/pelvis 06/17/2020-resolution of right retroperitoneal lymphadenopathy, no remaining pathologically enlarged lymph nodes, residual mild right hydronephrosis, no evidence of progressive disease, stable anal wall thickening Digital rectal exam by Dr. Quentin Cornwall 08/26/2020-posterior midline smooth scarring.  Mildly stenotic.  Stenosis seems better.  No palpable concerning lesions.  Anal canal feels smooth without palpable concern.  Unable to tolerate anoscopy.  Next visit in 6 months for surveillance. CT abdomen/pelvis 10/28/2020-no evidence of local recurrence or metastatic  disease.  Stable mild rectal wall thickening.  New diffuse bladder wall thickening possibly related  to prior radiation CT abdomen/pelvis 01/16/2021- recurrent right-sided hydronephrosis and proximal right hydroureter status post removal of right ureter stent, new soft tissue nodule along the course of the right ureter between the right psoas and IVC suspicious for recurrent lymphadenopathy PET 02/12/2021-small focus of hypermetabolism at the anorectal junction without a definable mass, right retroperitoneal nodal metastasis with obstruction of the proximal right ureter, right middle lobe hypermetabolic nodule, hypermetabolism in the right hilum without a defined nodal mass, hypermetabolic left supraclavicular and left axillary nodes 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 Cycle 1 Taxol/carboplatin 03/11/2021 Cycle 2 Taxol/carboplatin 03/31/2021, Fulphila Cycle 3 Taxol/carboplatin 04/21/2021, Fulphila CTs 05/11/2021-decrease size of FDG avid nodule in right middle lobe, right middle lobe subpleural nodule slightly enlarged, decreased in size of left axillary and subpectoral nodes, unchanged FDG avid right iliac nodes Cycle 4 Taxol/carboplatin 05/12/2021, G-CSF discontinued secondary to poor tolerance, Taxol and carboplatin dose reduced Cycle 5 Taxol/carboplatin 06/02/2021 Cycle 6 carboplatin 07/01/2021, Taxol held due to neuropathy Cycle 7 carboplatin 07/29/2021, Taxol held due to neuropathy CTs 08/16/2021-new liver lesion, increase in right psoas lymph node, unchanged right hydronephrosis, new mediastinal and right hilar lymphadenopathy, enlargement of bilateral pulmonary nodules and at least one new nodule, new subcarinal node Cycle 1 nivolumab 08/25/2021 Cycle 2 nivolumab 09/08/2021 Cycle 3 nivolumab 09/22/2021 Cycle 4 nivolumab 10/06/2021 Cycle 5 nivolumab 10/20/2021 Cycle 6 nivolumab 11/04/2021 CTs 11/12/2021-resolution of lung nodules, decreased size  of right psoas node, no remaining mediastinal lymphadenopathy, resolution of right liver lesion Cycle 7 nivolumab 11/17/2021 Cycle 8 nivolumab 12/01/2021 Cycle 9 nivolumab 12/15/2021 Cycle 10 nivolumab 12/30/2021 Cycle 11 nivolumab 01/12/2022 Cycle 12 nivolumab 5/10/264-monthy dosing Cycle 13 nivolumab 02/23/2022 CT 03/16/2022-stable right psoas mass, no pulmonary nodules, persistent circumferential anal thickening, unchanged subcentimeter left liver lesion Cycle 14 nivolumab 03/24/2022 Cycle 15 nivolumab 04/21/2022 Left labial lesions. Question direct extension from the anal cancer versus metastatic disease from anal cancer versus a separate malignant process. 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 Multiple family members with breast cancer. History of hypokalemia-likely secondary to decreased nutritional intake and diarrhea; potassium in normal range 01/16/2017. No longer taking a potassium supplement. Right hydroureteronephrosis on CT 01/28/2020, status post a cystoscopy/pyelogram 01/28/2020 confirming extrinsic compression of the right ureter, status post stent placement Right ureter stent exchange 06/12/2020 Right ureter stent removed 01/13/2021   8.  Admission 01/07/2021 with Klebsiella pneumoniae urosepsis/bacteremia  9.  Hospital admission 05/15/2021 with UTI/possible right pyelonephritis    Disposition: Ms. KPickingappears stable.  There is no clinical evidence of disease progression.  Plan to continue nivolumab every 4 weeks, treatment today.  CBC, chemistry panel and TSH reviewed.  Labs adequate to proceed as above.  We will see her in follow-up in 4 weeks.    LNed CardANP/GNP-BC   04/21/2022  12:23 PM

## 2022-04-21 NOTE — Patient Instructions (Signed)
[facility]   Discharge Instructions: Thank you for choosing [facility] to provide your oncology and hematology care.   If you have a lab appointment with the [facility], please go directly to the [facility] and check in at the registration area.   Wear comfortable clothing and clothing appropriate for easy access to any Portacath or PICC line.   We strive to give you quality time with your provider. You may need to reschedule your appointment if you arrive late (15 or more minutes).  Arriving late affects you and other patients whose appointments are after yours.  Also, if you miss three or more appointments without notifying the office, you may be dismissed from the clinic at the provider's discretion.      For prescription refill requests, have your pharmacy contact our office and allow 72 hours for refills to be completed.    Today you received the following chemotherapy and/or immunotherapy agents Opdivo      To help prevent nausea and vomiting after your treatment, we encourage you to take your nausea medication as directed.  BELOW ARE SYMPTOMS THAT SHOULD BE REPORTED IMMEDIATELY: *FEVER GREATER THAN 100.4 F (38 C) OR HIGHER *CHILLS OR SWEATING *NAUSEA AND VOMITING THAT IS NOT CONTROLLED WITH YOUR NAUSEA MEDICATION *UNUSUAL SHORTNESS OF BREATH *UNUSUAL BRUISING OR BLEEDING *URINARY PROBLEMS (pain or burning when urinating, or frequent urination) *BOWEL PROBLEMS (unusual diarrhea, constipation, pain near the anus) TENDERNESS IN MOUTH AND THROAT WITH OR WITHOUT PRESENCE OF ULCERS (sore throat, sores in mouth, or a toothache) UNUSUAL RASH, SWELLING OR PAIN  UNUSUAL VAGINAL DISCHARGE OR ITCHING   Items with * indicate a potential emergency and should be followed up as soon as possible or go to the Emergency Department if any problems should occur.  Please show the CHEMOTHERAPY ALERT CARD or IMMUNOTHERAPY ALERT CARD at check-in to the  Emergency Department and triage nurse.  Should you have questions after your visit or need to cancel or reschedule your appointment, please contact [facility]  Dept: [phone]  and follow the prompts.  Office hours are 8:00 a.m. to 4:30 p.m. Monday - Friday. Please note that voicemails left after 4:00 p.m. may not be returned until the following business day.  We are closed weekends and major holidays. You have access to a nurse at all times for urgent questions. Please call the main number to the clinic Dept: [phone] and follow the prompts.   For any non-urgent questions, you may also contact your provider using MyChart. We now offer e-Visits for anyone 73 and older to request care online for non-urgent symptoms. For details visit [email or web address].   Also download the MyChart app! Go to the app store, search "MyChart", open the app, select [facility], and log in with your MyChart username and password.  Masks are optional in the cancer centers. If you would like for your care team to wear a mask while they are taking care of you, please let them know. You may have one support person who is at least 73 years old accompany you for your appointments.  Nivolumab Injection What is this medication? NIVOLUMAB (nye VOL ue mab) treats some types of cancer. It works by helping your immune system slow or stop the spread of cancer cells. It is a monoclonal antibody. This medicine may be used for other purposes; ask your health care provider or pharmacist if you have questions. COMMON BRAND NAME(S): Opdivo What  should I tell my care team before I take this medication? They need to know if you have any of these conditions: Allogeneic stem cell transplant (uses someone else's stem cells) Autoimmune diseases, such as Crohn disease, ulcerative colitis, lupus History of chest radiation Nervous system problems, such as Guillain-Barre syndrome or myasthenia gravis Organ  transplant An unusual or allergic reaction to nivolumab, other medications, foods, dyes, or preservatives Pregnant or trying to get pregnant Breast-feeding How should I use this medication? This medication is infused into a vein. It is given in a hospital or clinic setting. A special MedGuide will be given to you before each treatment. Be sure to read this information carefully each time. Talk to your care team about the use of this medication in children. While it may be prescribed for children as young as 12 years for selected conditions, precautions do apply. Overdosage: If you think you have taken too much of this medicine contact a poison control center or emergency room at once. NOTE: This medicine is only for you. Do not share this medicine with others. What if I miss a dose? Keep appointments for follow-up doses. It is important not to miss your dose. Call your care team if you are unable to keep an appointment. What may interact with this medication? Interactions have not been studied. This list may not describe all possible interactions. Give your health care provider a list of all the medicines, herbs, non-prescription drugs, or dietary supplements you use. Also tell them if you smoke, drink alcohol, or use illegal drugs. Some items may interact with your medicine. What should I watch for while using this medication? Your condition will be monitored carefully while you are receiving this medication. You may need blood work while taking this medication. This medication may cause serious skin reactions. They can happen weeks to months after starting the medication. Contact your care team right away if you notice fevers or flu-like symptoms with a rash. The rash may be red or purple and then turn into blisters or peeling of the skin. You may also notice a red rash with swelling of the face, lips, or lymph nodes in your neck or under your arms. Tell your care team right away if you have any  change in your eyesight. Talk to your care team if you are pregnant or think you might be pregnant. A negative pregnancy test is required before starting this medication. A reliable form of contraception is recommended while taking this medication and for 5 months after the last dose. Talk to your care team about effective forms of contraception. Do not breast-feed while taking this medication and for 5 months after the last dose. What side effects may I notice from receiving this medication? Side effects that you should report to your care team as soon as possible: Allergic reactions--skin rash, itching, hives, swelling of the face, lips, tongue, or throat Dry cough, shortness of breath or trouble breathing Eye pain, redness, irritation, or discharge with blurry or decreased vision Heart muscle inflammation--unusual weakness or fatigue, shortness of breath, chest pain, fast or irregular heartbeat, dizziness, swelling of the ankles, feet, or hands Hormone gland problems--headache, sensitivity to light, unusual weakness or fatigue, dizziness, fast or irregular heartbeat, increased sensitivity to cold or heat, excessive sweating, constipation, hair loss, increased thirst or amount of urine, tremors or shaking, irritability Infusion reactions--chest pain, shortness of breath or trouble breathing, feeling faint or lightheaded Kidney injury (glomerulonephritis)--decrease in the amount of urine, red or dark  brown urine, foamy or bubbly urine, swelling of the ankles, hands, or feet Liver injury--right upper belly pain, loss of appetite, nausea, light-colored stool, dark yellow or brown urine, yellowing skin or eyes, unusual weakness or fatigue Pain, tingling, or numbness in the hands or feet, muscle weakness, change in vision, confusion or trouble speaking, loss of balance or coordination, trouble walking, seizures Rash, fever, and swollen lymph nodes Redness, blistering, peeling, or loosening of the skin,  including inside the mouth Sudden or severe stomach pain, bloody diarrhea, fever, nausea, vomiting Side effects that usually do not require medical attention (report these to your care team if they continue or are bothersome): Bone, joint, or muscle pain Diarrhea Fatigue Loss of appetite Nausea Skin rash This list may not describe all possible side effects. Call your doctor for medical advice about side effects. You may report side effects to FDA at 1-800-FDA-1088. Where should I keep my medication? This medication is given in a hospital or clinic. It will not be stored at home. NOTE: This sheet is a summary. It may not cover all possible information. If you have questions about this medicine, talk to your doctor, pharmacist, or health care provider.  2023 Elsevier/Gold Standard (2021-08-06 00:00:00)

## 2022-04-21 NOTE — Progress Notes (Signed)
Patient seen by Ned Card NP today  Vitals are within treatment parameters. Patient reports not able to stand for weight today due to recent ankle sprain.  Labs reviewed by Ned Card NP and are within treatment parameters. Verbal ANC 2.2 and platelet 135,000--OK to proceed on verbal report Per physician team, patient is ready for treatment and there are NO modifications to the treatment plan.

## 2022-04-26 ENCOUNTER — Encounter: Payer: Medicare HMO | Admitting: Physical Therapy

## 2022-05-03 ENCOUNTER — Encounter: Payer: Medicare HMO | Admitting: Physical Therapy

## 2022-05-03 ENCOUNTER — Encounter: Payer: Medicare HMO | Admitting: Rehabilitative and Restorative Service Providers"

## 2022-05-03 ENCOUNTER — Telehealth: Payer: Self-pay | Admitting: Rehabilitative and Restorative Service Providers"

## 2022-05-03 NOTE — Telephone Encounter (Signed)
Called patient after 15 mins no show for appointment today.  Left message and reminder of next appointment time, as well as call back number.   Scot Jun, PT, DPT, OCS, ATC 05/03/22  3:28 PM

## 2022-05-10 ENCOUNTER — Encounter: Payer: Self-pay | Admitting: Physical Therapy

## 2022-05-10 ENCOUNTER — Telehealth: Payer: Self-pay | Admitting: Pharmacist

## 2022-05-10 ENCOUNTER — Ambulatory Visit: Payer: Medicare HMO | Admitting: Physical Therapy

## 2022-05-10 DIAGNOSIS — M6281 Muscle weakness (generalized): Secondary | ICD-10-CM | POA: Diagnosis not present

## 2022-05-10 DIAGNOSIS — M25511 Pain in right shoulder: Secondary | ICD-10-CM

## 2022-05-10 DIAGNOSIS — R293 Abnormal posture: Secondary | ICD-10-CM | POA: Diagnosis not present

## 2022-05-10 DIAGNOSIS — R600 Localized edema: Secondary | ICD-10-CM | POA: Diagnosis not present

## 2022-05-10 DIAGNOSIS — G8929 Other chronic pain: Secondary | ICD-10-CM

## 2022-05-10 DIAGNOSIS — M25611 Stiffness of right shoulder, not elsewhere classified: Secondary | ICD-10-CM

## 2022-05-10 NOTE — Therapy (Signed)
OUTPATIENT PHYSICAL THERAPY TREATMENT NOTE          Patient Name: Ashley Moore MRN: [id number] DOB:[birth year], 71 y.o., female Today's Date: 05/10/2022  PCP: [name], MD REFERRING PROVIDER: No ref. provider found    PT End of Session - 05/10/22 1454     Visit Number 19    Number of Visits 22    Date for PT Re-Evaluation 06/14/22    Authorization Type HU[employer] 12 visits 4/18 - 03/11/2022    Authorization Time Period 12 visits 4/18 - 03/11/2022, resubmitted on 03/14/22 for 12 more visits through 06/14/22    Progress Note Due on Visit 20    PT Start Time 1434    PT Stop Time 1515    PT Time Calculation (min) 41 min    Activity Tolerance Patient limited by pain    Behavior During Therapy [facility] for tasks assessed/performed                  PT End of Session - 05/10/22 1454     Visit Number 19    Number of Visits 22    Date for PT Re-Evaluation 06/14/22    Authorization Type HUMANA 12 visits 4/18 - 03/11/2022    Authorization Time Period 12 visits 4/18 - 03/11/2022, resubmitted on 03/14/22 for 12 more visits through 06/14/22    Progress Note Due on Visit 20    PT Start Time 1434    PT Stop Time 1515    PT Time Calculation (min) 41 min    Activity Tolerance Patient limited by pain    Behavior During Therapy [facility] for tasks assessed/performed                        Past Medical History:  Diagnosis Date   Arthritis    Bowen's disease    excised 1992   Chronic low back pain    Chronic radiation cystitis    DDD (degenerative disc disease), cervical    Family history of breast cancer    Family history of lung cancer    Family history of non-Hodgkin's lymphoma    Family history of ovarian cancer    Headache    Hx of varicella    Hydronephrosis of right kidney    urologist--- dr Tresa Moore,  malignant,  treated with ureter stent   Hypothyroidism    followed by pcp   Lower urinary tract symptoms (LUTS)    01-12-2021  per pt treated with accupuncture  at dr Tresa Moore office   Lymphedema of both lower extremities    PICC (peripherally inserted central catheter) in place 01/11/2021   placed at [facility] in [place], [place] for IV antibiotics   Positive blood culture 01/08/2021   Klebsiella Pneumaniae,  treated with daily IV antibiotic   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[facility]) oncologist--- dr Benay Spice   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,  invasive SCC , completed chemo/ radiation 12-23-2016;  recurrent metastatsis retroperitoneal lymphdenopathy,  completed radiation 04-13-2020 residual right ureteral uropathy obstruction   Retroperitoneal lymphadenopathy    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   Sepsis due to Klebsiella pneumoniae [facility]) 01/07/2021   pt admitted to [facility] in [place],  dx sepsis secondary to acute pyelonephritis with bacteremia , positive blood culture,  discharged 01-11-2021 home daily IV antibiotic   Wears glasses    Past Surgical History:  Procedure Laterality Date   BUNIONECTOMY Right yrs ago   CYSTOSCOPY  W/ URETERAL STENT PLACEMENT Right 06/12/2020   Procedure: CYSTOSCOPY WITH RETROGRADE PYELOGRAM/URETERAL STENT EXCHANGE;  Surgeon: Alexis Frock, MD;  Location: [facility];  Service: Urology;  Laterality: Right;   CYSTOSCOPY W/ URETERAL STENT PLACEMENT Right 01/13/2021   Procedure: CYSTOSCOPY WITH RETROGRADE PYELOGRAM/URETERAL STENT REMOVALRIGHT;  Surgeon: Alexis Frock, MD;  Location: [facility];  Service: Urology;  Laterality: Right;  [place], URETEROSCOPY AND STENT PLACEMENT Right 01/28/2020   Procedure: CYSTOSCOPY WITH RETROGRADE PYELOGRAM, URETEROSCOPY AND STENT PLACEMENT;  Surgeon: Alexis Frock, MD;  Location: [facility];  Service: Urology;  Laterality: Right;   IR GENERIC HISTORICAL  11/14/2016   IR US GUIDE VASC ACCESS RIGHT 11/14/2016 [facility]   IR GENERIC HISTORICAL  11/14/2016   IR FLUORO GUIDE CV LINE RIGHT 11/14/2016  [facility]   IR GENERIC HISTORICAL  12/12/2016   IR US GUIDE VASC ACCESS RIGHT 12/12/2016 [name], MD [facility]   IR GENERIC HISTORICAL  12/12/2016   IR FLUORO GUIDE CV LINE RIGHT 12/12/2016 [name], MD [facility]   SKIN CANCER EXCISION  1990   bowens disease   Patient Active Problem List   Diagnosis Date Noted   Primary osteoarthritis, 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/28/2021   Atypical chest pain 06/29/2021   Intractable abdominal pain 05/16/2021   Acute pyelonephritis 05/16/2021   Acute cystitis without hematuria 05/16/2021   Hydronephrosis of right kidney 05/16/2021   Obstructive uropathy 05/16/2021   Hypothyroidism 05/16/2021   Hyponatremia 05/16/2021   Obesity (BMI 30-39.9) 05/16/2021   Goals of care, counseling/discussion 03/01/2021   SIRS (systemic inflammatory response syndrome) ([place]) 01/13/2021   Pyelonephritis 01/13/2021   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   Genetic testing 01/27/2020   Family history of breast cancer    Family history of lung cancer    Family history of ovarian cancer    Family history of non-Hodgkin's lymphoma    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   Port catheter in place 11/21/2016   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   Anal cancer ([place]) 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   Cystitis 05/06/2015   Degenerative cervical disc 04/08/2015   Trigger point of neck 03/26/2015   Pain in joint, ankle and foot 04/23/2013   Visit for preventive health examination 02/13/2013   Routine gynecological examination 02/13/2013   Family history of breast cancer in first degree relative 02/13/2013   Rash and nonspecific skin eruption 02/13/2013   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   VITAMIN D DEFICIENCY 06/25/2008   NECK PAIN 06/02/2008   FOOT PAIN 06/02/2008   Osteopenia 06/02/2008    REFERRING DIAG: M25.511,G89.29 (ICD-10-CM) - Chronic right shoulder pain   THERAPY DIAG:  Stiffness of right shoulder, not elsewhere classified  Muscle weakness  (generalized)  Chronic right shoulder pain  Localized edema  Abnormal posture  PERTINENT HISTORY: Arthritis Bowen's diseaseexcised 1992 Chronic low back pain Chronic radiation cystitis DDD (degenerative disc disease), cervical Family history of breast cancer Family history of lung cancer Family history of non-Hodgkin's lymphoma Family history of ovarian cancer Headache Hx of varicella Hydronephrosis of right kidney rectal cancer   PRECAUTIONS: Other: infusion therapy for Cancer , fall precuations   SUBJECTIVE: Pt reporting new left ankle sprain when stepping funny as she was walking her dog.   PAIN:  Are you having pain? yes, NPRS scale: pain stops my motion and can increase to 5/10 Pain location: anterior shoulder and lateral Rt shoulder into elbow Pain orientation: Right  PAIN TYPE: aching  Pain description: constant  Aggravating factors: reaching upward, lateral movements Relieving factors: resting and heat    OBJECTIVE:      PATIENT SURVEYS:  FOTO: 51% on 10/19/2021 FOTO: 53% on 01/04/2022 FOTO: 54.3% 03/14/2022 FOTO: 61.4% 05/10/22  UPPER EXTREMITY AROM/PROM:   Active ROM  In supine Right 10/19/2021 Left 10/19/2021 Rt  02/15/22 Rt 03/01/2022  Rt 03/14/22 Rt 04/18/22 supine  Shoulder flexion 120 164 130 140 142 142  Shoulder extension 30 45   50      Shoulder abduction 98 160 125  130 130  Shoulder adduction          Shoulder internal rotation 75 shoulder abd 45 degrees 75 75 Shoulder abd 45 deg  75  Shoulder abd 45 deg 75 Shoulder abd 45 deg  Shoulder external rotation 15 Shoulder abd 45 degrees 80 10 Shoulder abd 45 deg  0 Shoulder abd 45 deg 0  Shoulder abd 45 deg   Elbow flexion 140   145 145     Elbow extension 0 0 0     Wrist flexion          Wrist extension          Wrist ulnar deviation          Wrist radial deviation          Wrist pronation          Wrist supination          (Blank rows = not tested)   Passive ROM In degrees Right 10/19/21  Right 11/09/21 Right  11/16/21 Right 12/06/21 Rt 12/13/21 Rt 01/04/22 supine Rt 02/15/22 supine Rt 03/14/22 Rt 04/12/22 supine Rt 04/18/22 supine  Shoulder flexion 130  120 134 145 145 155 160  154 152  Shoulder extension 35            Shoulder abduction 105 104  110 120 120 150 155  125 124  Shoulder adduction              Shoulder internal rotation 80 shoulder abd 45 degrees    80 shoulder  abducted 45 degrees 78 shoulder  abducted 45 degrees 75 degrees (Shoulder abd 45 degrees) 75 deg Shoulder abd 45 deg 75 Shoulder abd 45 deg 72 Shoulder abd 45 deg 72  Shoulder external rotation 20 Shoulder abd 45 degrees 30 Shoulder abducted to 45 degrees   30 Shoulder abducted to 45 degrees 30 Shoulder abducted to 45 degrees 28 degrees Shoulder abd 45 degrees) 30 deg Shoulder abd 45 deg 10  Shoulder abd 45 deg 0 Shoulder abd 45 deg 5  Shoulder abd  45 deg  Elbow flexion              Elbow extension              Wrist flexion              Wrist extension              Wrist ulnar deviation              Wrist radial deviation              Wrist pronation              Wrist supination                  UPPER EXTREMITY MMT:   MMT Right 10/19/2021 Left 10/19/2021 Right 12/06/2021 Rt 12/28/2021 Rt 03/07/22 Rt 03/14/2022  Shoulder flexion  2+/5 4+/5 3-/5 3/5 3+/5 3+/5  Shoulder extension 2+/5 4+/5 3-/5 3/5 3+/5 3+/5  Shoulder abduction 2+/5 4+/5 3-/5 3/5 3+/5 3+/5  Shoulder adduction          Shoulder internal rotation 2+/5 4+/5 3-/5 3/5 3+/5 3+/5  Shoulder external rotation 2+/5 4+/5 3-/5 3/5 3+/5 2+/5 tested at 0 deg  Middle trapezius          Lower trapezius          Elbow flexion          Elbow extension          Wrist flexion          Wrist extension          Wrist ulnar deviation          Wrist radial deviation          Wrist pronation          Wrist supination          Grip strength (lbs) 31.8 pounds 48 pounds    29.8 pounds  38.2 pounds  (Blank rows = not tested)                   TODAY'S TREATMENT:   05/10/2022:  TherEx: Pulleys x 4 minutes, flexion and scaption UBE: 3 minutes forward and backward at Level 2.5 Supine AAROM shoulder flexion 3 # bar x 10 Seated Rows: level 3 band 2 x 10 Lifting 1# weight over small physioball x 10 Lifting 2# weighted ball over dumbell x 10 Walking 2# ball up and down wall x 10 Manual: PROM: Rt shoulder flexion, ER, abduction, IR, grade 2-3 AP GH mobs Modaliites Moist heat x 5 minutes at end of session   04/18/2022:  TherEx: Pulleys x 5 minutes, flexion and scaption UBE: 4 minutes forward and 3 minutes backward at Level 2.5 Supine AAROM shoulder flexion Supine shoulder presses with 3# bar 2 x 10 Sidelying Abduction x 15  Manual: PROM: Rt shoulder flexion, ER, abduction, IR, grade 2-3 AP GH mobs Modaliites Moist heat x 5 minutes at end of session    04/12/2022:  TherEx: Pulleys x 5 minutes, flexion and scaption Supine AAROM shoulder flexion Sidelying AAROM IR x 10 holding 3-5 seconds  Manual: PROM: Rt shoulder flexion, ER, abduction, IR, grade 2-3 AP GH mobs Modaliites Moist heat x 5 minutes at end of session   03/28/2022:  TherEx: AA flexion sitting c 1 # bar 2 x 10 to 90 degrees AA abduction sitting c 1 # bar 2 x 10 to 70 degrees Bicep curls with 3 # weight x 10 Supine AAROM ER 2 x 10 Rolling green physioball in semicircle from side to side while sitting on edge of mat x 10 Rolling green physioball forward/back x 5 for shoulder flexion Rows: Level 3 band x 20 ER: level 2 band elbow at 90 degrees walking sideways   Manual: PROM: Rt shoulder flexion, ER, abduction, grade 2-3 AP GH mobs Modaliites Moist heat x 5 minutes at end of session        PATIENT EDUCATION: Education details:added isometrics to pt's HEP  Person educated: Patient Education method: Explanation Education comprehension: verbalized understanding, returned demonstration, and verbal cues required     HOME EXERCISE  PROGRAM: Access Code: [email or web address] URL: [email or web address] Date: 10/19/2021 Prepared by: Kearney Hard   Exercises Seated Shoulder Flexion Towel Slide at Table Top - 2-3 x daily - 7 x weekly - 1 sets - 10 reps -  5-10 seocnds hold Seated Shoulder External Rotation PROM on Table - 2-3 x daily - 7 x weekly - 1 sets - 10 reps - 5-10 seconds hold Seated Scapular Retraction - 2-3 x daily - 7 x weekly - 1 sets - 10 reps - 5-10 seconds hold     ASSESSMENT:   CLINICAL IMPRESSION: Pt arriving today reporting stepping funny while walking her dog and sprained her left ankle. Pt was seen in the ER. Pt currently using a straight cane in her Rt UE for support which pt believes may be aggravating her shoulder pain. Pt's FOTO has improved to 61%. Pt tolerating skilled PT today with more focus on non standing activities.       REHAB POTENTIAL: fair, due to co-morbidities   CLINICAL DECISION MAKING: Evolving/moderate complexity   EVALUATION COMPLEXITY: Moderate     GOALS: Goals reviewed with patient? Yes   SHORT TERM GOALS:   STG Name Target Date Goal status  1 Pt will be independent with her initial HEP.    11/16/2021 Met 11/09/2021  2 Pt will be able to perform 5 time sit to stand with no UE support </= 15 seconds Baseline:  11/16/2021 MET  01/04/2022    LONG TERM GOALS:    LTG Name Target Date Goal status  1 PT will be independent in her advanced HEP.  Baseline: 01/11/2022 MET  01/04/22  2 Pt will improve FOTO score to >/= 63 % Baseline: 53% on 01/04/2022 06/14/22 Ongoing 03/14/2022  3 Pt will improve her right shoulder ER to >/= 30 degrees to improve functional mobility.  Baseline: 06/14/22 Ongoing 03/14/2022  4 Pt will be able to report pain of </= 2/10 with ADL's using her right UE.  Baseline: pain depends on the day 01/04/22 06/14/22 On going  03/14/2022       5.   Pt will be able to reach her left under arm for donning deodorant.  06/14/22 On-going 03/14/2022                       PLAN: PT FREQUENCY: 1x/ week for 12 weeks Approved for 12 visits from 01/04/2022 -  03/11/2022 from [place] for 12 visits from 03/11/2022 to 06/14/2022 from [place] sent on 01/24/56     PT DURATION:  1 x/ week x 6 weeks   PLANNED INTERVENTIONS: Therapeutic exercises, Therapeutic activity, Neuro Muscular re-education, Balance training, Gait training, Patient/Family education, Joint mobilization, Dry Needling, Cryotherapy, Moist heat, Taping, Vasopneumatic device, and Manual therapy   PLAN FOR NEXT SESSION:  AAROM/AROM gains as tolerated, strengthening   Kearney Hard, PT, MPT 05/10/22 2:56 PM

## 2022-05-10 NOTE — Chronic Care Management (AMB) (Signed)
Chronic Care Management Pharmacy Assistant   Name: Ashley Moore  MRN: [id number] DOB: September 04, 1951  Reason for Encounter: Disease State   Conditions to be addressed/monitored: General Assessment  Recent office visits:  01/20/22 Laurey Morale, MD - Patient presented for UTI with hematuria and other concerns. Prescribed Ciprofloxacin.  Recent consult visits:  04/21/22 Patient presented to [facility] at [facility] for Oncology Treatment.  04/21/22 [name], NP (Oncology) - Patient presented for Anal cancer. No medication changes.  04/18/22 Oretha Caprice, PT (PT) - Patient presented for Stiffness of right shoulder and other concerns. No medication changes.  04/12/22 Oretha Caprice, PT (PT) - Patient presented for Stiffness of right shoulder and other concerns. No medication changes.  03/29/22 Nahser, Wonda Cheng, MD (Cardiology) - Patient presented for Coronary artery calcification. No medication changes.  03/28/22 Oretha Caprice, PT (PT) - Patient presented for Stiffness of right shoulder and other concerns. No medication changes.  03/24/22 Patient presented to [facility] at [facility] for Oncology Treatment.  03/24/22 [name], MD (Oncology) - Patient presented for Anal Cancer. Stopped Polyethylene Glycol.  03/16/22 Patient presented to [place] for CT Chest ABD/Pelvis w Contrast.  03/14/22 Oretha Caprice, PT (PT) - Patient presented for Stiffness of right shoulder and other concerns. No medication changes.  03/03/22 Janith Lima (Urology) - Claims encounter for Hydronephrosis with uteropelvic junction obstruction and other concerns. No other visit details.  03/01/22 Girtha Rm, PT - Patient presented for Stiffness of right shoulder and other concerns. No medication changes.  02/23/22 Patient presented to [facility] at [facility] for Oncology Treatment.  02/23/22 [name], NP -  Patient presented for Anal cancer and other concerns. Stopped Cipro  01/26/22 Patient presented to [facility] at [facility] for Infusion.  01/26/22 [name], MD (Oncology) - Patient presented for Anal Cancer. No medication changes.  01/12/22 Patient presented to [facility] at [facility] for Infusion.  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 No medication changes.  12/30/21 [name], MD (Oncology) - Patient presented for Anal cancer. No medication changes.  12/29/21 Delanna Notice, MD (GYN) - Patient presented for Anal squamous cell carcinoma. Prescribed Macrobid.  Hospital visits:  Medication Reconciliation was completed by comparing discharge summary, patient's EMR and Pharmacy list, and upon discussion with patient.  Patient presented to [place] ED on 04/20/22 due to Sprain of left ankle. Patient was present for 1 hour.  New?Medications Started at [facility] Discharge:?? -started  none  Medication Changes at Hospital Discharge: -Changed  none  Medications Discontinued at Hospital Discharge: -Stopped  none  Medications that remain the same after Hospital Discharge:??  -All other medications will remain the same.    Medications: Outpatient Encounter Medications as of 05/10/2022  Medication Sig   acetaminophen (TYLENOL) 500 MG tablet Take 1,000 mg by mouth 2 (two) times daily.   Ascorbic Acid (VITAMIN C) 1000 MG tablet Take 1,000 mg by mouth daily.   azelastine (ASTELIN) 0.1 % nasal spray Place 2 sprays into both nostrils 2 (two) times daily. Use in each nostril as directed   Cholecalciferol (VITAMIN D3 PO) Take 10,000 Units by mouth daily.   conjugated estrogens (PREMARIN) vaginal cream Place 1 Applicatorful vaginally daily.   diclofenac Sodium (VOLTAREN) 1 % GEL Apply 2 g topically daily. To shoulder   levothyroxine (  SYNTHROID) 75 MCG tablet TAKE 1 TABLET EVERY  DAY   loratadine (CLARITIN) 10 MG tablet Take 10 mg by mouth daily.   LORazepam (ATIVAN) 0.5 MG tablet Take 1 tablet (0.5 mg total) by mouth every 8 (eight) hours as needed for anxiety (as needed).   MYRBETRIQ 50 MG TB24 tablet Take 50 mg by mouth daily.   OVER THE COUNTER MEDICATION Place 1 drop into both eyes daily as needed (dry eyes).   OVER THE COUNTER MEDICATION Gundry MD Total Restore   OVER THE COUNTER MEDICATION Take by mouth 2 (two) times daily. GI advantage promotes smooth comfortable digestion with a unique blend of gluten digestive enzymes probiotic and fermented fruit polyphenal   Prenatal Vit-Fe Fumarate-FA (PRENATAL PO) Take by mouth daily.   protein supplement shake (PREMIER PROTEIN) LIQD Take 11 oz by mouth daily.   TURMERIC PO Take 800 mg by mouth in the morning and at bedtime.   No facility-administered encounter medications on file as of 05/10/2022.  [place] for General Review Call  Adherence Review:  Does the Clinical Pharmacist Assistant have access to adherence rates? Yes Adherence rates for STAR metric medications  No star rated medications.  Disease State Questions:  Able to connect with Patient? Yes Did patient have any problems with their health recently? No Patient reports she has heard from her doctors they think she is currently in remission and she is happy about that.  Have you had any admissions or emergency room visits or worsening of your condition(s) since last visit? Yes  Have you had any visits with new specialists or providers since your last visit? No  Have you had any new health care problem(s) since your last visit? No New problem(s) reported:Patient reports her therapy will soon be ending she has bad arthritis and is using a cane, she reports that they have told her she can only take tylenol but she does not think it helps much and she does not want anything stronger.  Have you run out of any of your medications since  you last spoke with clinical pharmacist? No  Are there any medications you are not taking as prescribed? No  Are you having any issues or side effects with your medications? No  Do you have any other health concerns or questions you want to discuss with your Clinical Pharmacist before your next visit? Yes Patient reports she is taking a lot of vitamins and collagen and eating [place] nuts almost daily wanted to know if that is good for her and would like a review of her medications she is using currently. Offered an appointment for Pharmacist to go over and she has accepted.  Are there any health concerns that you feel we can do a better job addressing? No  Are you having any problems with any of the following since the last visit:   Other  Details:Pain/arthritis 12. Any falls since last visit? No   13. Any increased or uncontrolled pain since last visit? No  14. Next visit Type: telephone       Clinical Pharmacist 9/23  15. Additional Details? No      Care Gaps: Zoster Vaccine - Overdue COVID Booster - Overdue Colonoscopy - Overdue Flu Vaccine - Postponed CCM- 9/23  Star Rating Drugs: None    Ned Clines [place] Clinical Pharmacist Assistant [phone]

## 2022-05-15 ENCOUNTER — Other Ambulatory Visit: Payer: Self-pay | Admitting: Oncology

## 2022-05-15 DIAGNOSIS — C772 Secondary and unspecified malignant neoplasm of intra-abdominal lymph nodes: Secondary | ICD-10-CM

## 2022-05-15 NOTE — Progress Notes (Signed)
ON PATHWAY REGIMEN - Anal Carcinoma  No Change  Continue With Treatment as Ordered.  Original Decision Date/Time: 03/01/2021 12:11     A cycle is every 28 days:     Paclitaxel      Carboplatin   **Always confirm dose/schedule in your pharmacy ordering system**  Patient Characteristics: Anal Canal Tumors, Distant Metastases / Local Recurrence - Unresectable, First Line Therapeutic Status: Distant Metastases Line of therapy: First Line  Intent of Therapy: Non-Curative / Palliative Intent, Discussed with Patient

## 2022-05-17 ENCOUNTER — Ambulatory Visit: Payer: Medicare HMO | Admitting: Physical Therapy

## 2022-05-17 ENCOUNTER — Encounter: Payer: Self-pay | Admitting: Physical Therapy

## 2022-05-17 DIAGNOSIS — R600 Localized edema: Secondary | ICD-10-CM

## 2022-05-17 DIAGNOSIS — G8929 Other chronic pain: Secondary | ICD-10-CM

## 2022-05-17 DIAGNOSIS — M25611 Stiffness of right shoulder, not elsewhere classified: Secondary | ICD-10-CM

## 2022-05-17 DIAGNOSIS — R293 Abnormal posture: Secondary | ICD-10-CM

## 2022-05-17 DIAGNOSIS — M25511 Pain in right shoulder: Secondary | ICD-10-CM | POA: Diagnosis not present

## 2022-05-17 DIAGNOSIS — M6281 Muscle weakness (generalized): Secondary | ICD-10-CM

## 2022-05-17 NOTE — Therapy (Signed)
OUTPATIENT PHYSICAL THERAPY TREATMENT NOTE          Patient Name: Ashley Moore MRN: [id number] DOB:04-May-1951, 71 y.o., female Today's Date: 05/17/2022  PCP: [name], MD REFERRING PROVIDER: [name], MD    PT End of Session - 05/17/22 1448     Visit Number 20    Number of Visits 22    Date for PT Re-Evaluation 06/14/22    Authorization Type HU[employer] 12 visits 4/18 - 03/11/2022    Authorization Time Period 12 visits 4/18 - 03/11/2022, resubmitted on 03/14/22 for 12 more visits through 06/14/22    Progress Note Due on Visit 20    PT Start Time 1432    PT Stop Time 1515    PT Time Calculation (min) 43 min    Activity Tolerance Patient limited by pain    Behavior During Therapy [facility] for tasks assessed/performed                   PT End of Session - 05/17/22 1448     Visit Number 20    Number of Visits 22    Date for PT Re-Evaluation 06/14/22    Authorization Type HU[employer] 12 visits 4/18 - 03/11/2022    Authorization Time Period 12 visits 4/18 - 03/11/2022, resubmitted on 03/14/22 for 12 more visits through 06/14/22    Progress Note Due on Visit 20    PT Start Time 1432    PT Stop Time 1515    PT Time Calculation (min) 43 min    Activity Tolerance Patient limited by pain    Behavior During Therapy [facility] for tasks assessed/performed                         Past Medical History:  Diagnosis Date   Arthritis    Bowen's disease    excised 1992   Chronic low back pain    Chronic radiation cystitis    DDD (degenerative disc disease), cervical    Family history of breast cancer    Family history of lung cancer    Family history of non-Hodgkin's lymphoma    Family history of ovarian cancer    Headache    Hx of varicella    Hydronephrosis of right kidney    urologist--- dr Tresa Moore,  malignant,  treated with ureter stent   Hypothyroidism    followed by pcp   Lower urinary tract symptoms (LUTS)    01-12-2021  per pt treated with  accupuncture at dr Tresa Moore office   Lymphedema of both lower extremities    PICC (peripherally inserted central catheter) in place 01/11/2021   placed at [facility] in [place], [place] for IV antibiotics   Positive blood culture 01/08/2021   Klebsiella Pneumaniae,  treated with daily IV antibiotic   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) 01/07/2021   pt admitted to [facility] in [place],  dx sepsis secondary to acute pyelonephritis with bacteremia , positive blood culture,  discharged 01-11-2021 home daily IV antibiotic   Wears glasses    Past Surgical History:  Procedure Laterality Date   BUNIONECTOMY Right yrs ago  CYSTOSCOPY W/ URETERAL STENT PLACEMENT Right 06/12/2020   Procedure: CYSTOSCOPY WITH RETROGRADE PYELOGRAM/URETERAL STENT EXCHANGE;  Surgeon: Alexis Frock, MD;  Location: [facility];  Service: Urology;  Laterality: Right;   CYSTOSCOPY W/ URETERAL STENT PLACEMENT Right 01/13/2021   Procedure: CYSTOSCOPY WITH RETROGRADE PYELOGRAM/URETERAL STENT REMOVALRIGHT;  Surgeon: Alexis Frock, MD;  Location: [facility];  Service: Urology;  Laterality: Right;  [place], URETEROSCOPY AND STENT PLACEMENT Right 01/28/2020   Procedure: CYSTOSCOPY WITH RETROGRADE PYELOGRAM, URETEROSCOPY AND STENT PLACEMENT;  Surgeon: Alexis Frock, MD;  Location: [facility];  Service: Urology;  Laterality: Right;   IR GENERIC HISTORICAL  11/14/2016   IR US GUIDE VASC ACCESS RIGHT 11/14/2016 [facility]   IR GENERIC HISTORICAL  11/14/2016   IR FLUORO GUIDE CV LINE RIGHT  11/14/2016 [facility]   IR GENERIC HISTORICAL  12/12/2016   IR US GUIDE VASC ACCESS RIGHT 12/12/2016 [name], MD [facility]   IR GENERIC HISTORICAL  12/12/2016   IR FLUORO GUIDE CV LINE RIGHT 12/12/2016 [name], MD [facility]   SKIN CANCER EXCISION  1990   bowens disease   Patient Active Problem List   Diagnosis Date Noted   Primary osteoarthritis, 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/28/2021   Atypical chest pain 06/29/2021   Intractable abdominal pain 05/16/2021   Acute pyelonephritis 05/16/2021   Acute cystitis without hematuria 05/16/2021   Hydronephrosis of right kidney 05/16/2021   Obstructive uropathy 05/16/2021   Hypothyroidism 05/16/2021   Hyponatremia 05/16/2021   Obesity (BMI 30-39.9) 05/16/2021   Goals of care, counseling/discussion 03/01/2021   SIRS (systemic inflammatory response syndrome) ([place]) 01/13/2021   Pyelonephritis 01/13/2021   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   Genetic testing 01/27/2020   Family history of breast cancer    Family history of lung cancer    Family history of ovarian cancer    Family history of non-Hodgkin's lymphoma    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   Port catheter in place 11/21/2016   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   Anal cancer ([place]) 11/03/2016   Cystitis 05/06/2015   Degenerative cervical disc 04/08/2015   Trigger point of neck 03/26/2015   Pain in joint, ankle and foot 04/23/2013   Visit for preventive health examination 02/13/2013   Routine gynecological examination 02/13/2013   Family history of breast cancer in first degree relative 02/13/2013   Rash and nonspecific skin eruption 02/13/2013   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   VITAMIN D DEFICIENCY 06/25/2008   NECK PAIN 06/02/2008   FOOT PAIN 06/02/2008   Osteopenia 06/02/2008    REFERRING DIAG: M25.511,G89.29 (ICD-10-CM) - Chronic right shoulder pain   THERAPY DIAG:  Stiffness of right shoulder, not elsewhere  classified  Muscle weakness (generalized)  Chronic right shoulder pain  Localized edema  Abnormal posture  PERTINENT HISTORY: Arthritis Bowen's diseaseexcised 1992 Chronic low back pain Chronic radiation cystitis DDD (degenerative disc disease), cervical Family history of breast cancer Family history of lung cancer Family history of non-Hodgkin's lymphoma Family history of ovarian cancer Headache Hx of varicella Hydronephrosis of right kidney rectal cancer   PRECAUTIONS: Other: infusion therapy for Cancer , fall precuations   SUBJECTIVE: Pt arriving today reporting no pain at rest. No new falls reported   PAIN:  Are you having pain? yes, only with movements of Rt shoulder reaching out and upward NPRS scale: pain stops my motion and can increase to 5/10 Pain location: anterior shoulder and lateral Rt shoulder into  elbow Pain orientation: Right  PAIN TYPE: aching Pain description: constant  Aggravating factors: reaching upward, lateral movements Relieving factors: resting and heat    OBJECTIVE:      PATIENT SURVEYS:  FOTO: 51% on 10/19/2021 FOTO: 53% on 01/04/2022 FOTO: 54.3% 03/14/2022 FOTO: 61.4% 05/10/22  UPPER EXTREMITY AROM/PROM:   Active ROM  In supine Right 10/19/2021 Left 10/19/2021 Rt  02/15/22 Rt 03/01/2022  Rt 03/14/22 Rt 04/18/22 supine  Shoulder flexion 120 164 130 140 142 142  Shoulder extension 30 45   50      Shoulder abduction 98 160 125  130 130  Shoulder adduction          Shoulder internal rotation 75 shoulder abd 45 degrees 75 75 Shoulder abd 45 deg  75  Shoulder abd 45 deg 75 Shoulder abd 45 deg  Shoulder external rotation 15 Shoulder abd 45 degrees 80 10 Shoulder abd 45 deg  0 Shoulder abd 45 deg 0  Shoulder abd 45 deg   Elbow flexion 140   145 145     Elbow extension 0 0 0     Wrist flexion          Wrist extension          Wrist ulnar deviation          Wrist radial deviation          Wrist pronation          Wrist supination           (Blank rows = not tested)   Passive ROM In degrees Right 10/19/21 Right 11/09/21 Right  11/16/21 Right 12/06/21 Rt 12/13/21 Rt 01/04/22 supine Rt 02/15/22 supine Rt 03/14/22 Rt 04/12/22 supine Rt 04/18/22 supine  Shoulder flexion 130  120 134 145 145 155 160  154 152  Shoulder extension 35            Shoulder abduction 105 104  110 120 120 150 155  125 124  Shoulder adduction              Shoulder internal rotation 80 shoulder abd 45 degrees    80 shoulder  abducted 45 degrees 78 shoulder  abducted 45 degrees 75 degrees (Shoulder abd 45 degrees) 75 deg Shoulder abd 45 deg 75 Shoulder abd 45 deg 72 Shoulder abd 45 deg 72  Shoulder external rotation 20 Shoulder abd 45 degrees 30 Shoulder abducted to 45 degrees   30 Shoulder abducted to 45 degrees 30 Shoulder abducted to 45 degrees 28 degrees Shoulder abd 45 degrees) 30 deg Shoulder abd 45 deg 10  Shoulder abd 45 deg 0 Shoulder abd 45 deg 5  Shoulder abd  45 deg  Elbow flexion              Elbow extension              Wrist flexion              Wrist extension              Wrist ulnar deviation              Wrist radial deviation              Wrist pronation              Wrist supination                  UPPER EXTREMITY MMT:   MMT Right 10/19/2021 Left 10/19/2021 Right 12/06/2021 Rt  12/28/2021 Rt 03/07/22 Rt 03/14/2022 Rt 05/17/22  Shoulder flexion 2+/5 4+/5 3-/5 3/5 3+/5 3+/5 3+/5  Shoulder extension 2+/5 4+/5 3-/5 3/5 3+/5 3+/5 3+/5  Shoulder abduction 2+/5 4+/5 3-/5 3/5 3+/5 3+/5 3+/5  Shoulder adduction           Shoulder internal rotation 2+/5 4+/5 3-/5 3/5 3+/5 3+/5 3+/5  Shoulder external rotation 2+/5 4+/5 3-/5 3/5 3+/5 2+/5 tested at 0 deg 3-/5 Tested at 0 degrees  Middle trapezius           Lower trapezius           Elbow flexion           Elbow extension           Wrist flexion           Wrist extension           Wrist ulnar deviation           Wrist radial deviation           Wrist pronation            Wrist supination           Grip strength (lbs) 31.8 pounds 48 pounds    29.8 pounds  38.2 pounds   (Blank rows = not tested)                  TODAY'S TREATMENT:   05/17/2022:  TherEx: Pulleys x 3 minutes, scaption UBE: 4 minutes forward and backward at Level 2.5 Supine AAROM shoulder flexion 2# bar x 87fom 90 degrees to 120 degrees Seated Rows: level 3 band 2 x 10 Chest press at 90 degrees x 20  Manual: PROM: Rt shoulder flexion, ER, abduction, IR, grade 2-3 AP GH mobs Modaliites Moist heat placed on pt's Rt shoulder during supine AAOM exercises and PROM   05/10/2022:  TherEx: Pulleys x 4 minutes, flexion and scaption UBE: 3 minutes forward and backward at Level 2.5 Supine AAROM shoulder flexion 3 # bar x 10 Seated Rows: level 3 band 2 x 10 Lifting 1# weight over small physioball x 10 Lifting 2# weighted ball over dumbell x 10 Walking 2# ball up and down wall x 10 Manual: PROM: Rt shoulder flexion, ER, abduction, IR, grade 2-3 AP GH mobs Modaliites Moist heat x 5 minutes at end of session   04/18/2022:  TherEx: Pulleys x 5 minutes, flexion and scaption UBE: 4 minutes forward and 3 minutes backward at Level 2.5 Supine AAROM shoulder flexion Supine shoulder presses with 3# bar 2 x 10 Sidelying Abduction x 15  Manual: PROM: Rt shoulder flexion, ER, abduction, IR, grade 2-3 AP GH mobs Modaliites Moist heat x 5 minutes at end of session           PATIENT EDUCATION: Education details:added isometrics to pt's HEP  Person educated: Patient Education method: Explanation Education comprehension: verbalized understanding, returned demonstration, and verbal cues required     HOME EXERCISE PROGRAM: Access Code: [email or web address] URL: [email or web address] Date: 10/19/2021 Prepared by: JKearney Hard  Exercises Seated Shoulder Flexion Towel Slide at Table Top - 2-3 x daily - 7 x weekly - 1 sets - 10 reps - 5-10 seocnds hold Seated Shoulder  External Rotation PROM on Table - 2-3 x daily - 7 x weekly - 1 sets - 10 reps - 5-10 seconds hold Seated Scapular Retraction - 2-3 x daily - 7 x weekly - 1 sets - 10 reps - 5-10 seconds hold  ASSESSMENT:   CLINICAL IMPRESSION: Pt arriving today reporting no pain at rest. Pt reporting pain can increase to 9/75 with certain movements and more abd and flexion. Pt still with limited ER to 0 degrees passively. Pt's straight cane was adjusted to pt's height to prevent shoulder hiking in her Rt UE during amb. Pt has 2 more PT visits left on this plan of care and then pt is preparing for discharge.      REHAB POTENTIAL: fair, due to co-morbidities   CLINICAL DECISION MAKING: Evolving/moderate complexity   EVALUATION COMPLEXITY: Moderate     GOALS: Goals reviewed with patient? Yes   SHORT TERM GOALS:   STG Name Target Date Goal status  1 Pt will be independent with her initial HEP.    11/16/2021 Met 11/09/2021  2 Pt will be able to perform 5 time sit to stand with no UE support </= 15 seconds Baseline:  11/16/2021 MET  01/04/2022    LONG TERM GOALS:    LTG Name Target Date Goal status  1 PT will be independent in her advanced HEP.  Baseline: 01/11/2022 MET  01/04/22  2 Pt will improve FOTO score to >/= 63 % Baseline: 53% on 01/04/2022 06/14/22 Ongoing 03/14/2022  3 Pt will improve her right shoulder ER to >/= 30 degrees to improve functional mobility.  Baseline: 06/14/22 Ongoing 03/14/2022  4 Pt will be able to report pain of </= 2/10 with ADL's using her right UE.  Baseline: pain depends on the day 01/04/22 06/14/22 On going  03/14/2022       5.   Pt will be able to reach her left under arm for donning deodorant.  06/14/22 On-going 03/14/2022                      PLAN: PT FREQUENCY: 1x/ week for 12 weeks Approved for 12 visits from 01/04/2022 -  03/11/2022 from [place] for 12 visits from 03/11/2022 to 06/14/2022 from [place] sent on [birth year]     PT DURATION:  1 x/  week x 6 weeks   PLANNED INTERVENTIONS: Therapeutic exercises, Therapeutic activity, Neuro Muscular re-education, Balance training, Gait training, Patient/Family education, Joint mobilization, Dry Needling, Cryotherapy, Moist heat, Taping, Vasopneumatic device, and Manual therapy   PLAN FOR NEXT SESSION:  AAROM/AROM gains as tolerated, strengthening   Kearney Hard, PT, MPT 05/17/22 3:19 PM

## 2022-05-19 ENCOUNTER — Inpatient Hospital Stay: Payer: Medicare HMO

## 2022-05-19 ENCOUNTER — Inpatient Hospital Stay: Payer: Medicare HMO | Admitting: Oncology

## 2022-05-19 ENCOUNTER — Encounter: Payer: Self-pay | Admitting: *Deleted

## 2022-05-19 VITALS — BP 123/69 | HR 69 | Resp 20

## 2022-05-19 VITALS — BP 128/73 | HR 82 | Temp 98.1°F | Resp 18 | Ht 62.0 in | Wt 164.0 lb

## 2022-05-19 DIAGNOSIS — C772 Secondary and unspecified malignant neoplasm of intra-abdominal lymph nodes: Secondary | ICD-10-CM

## 2022-05-19 DIAGNOSIS — Z79899 Other long term (current) drug therapy: Secondary | ICD-10-CM | POA: Diagnosis not present

## 2022-05-19 DIAGNOSIS — C210 Malignant neoplasm of anus, unspecified: Secondary | ICD-10-CM | POA: Diagnosis not present

## 2022-05-19 DIAGNOSIS — Z5112 Encounter for antineoplastic immunotherapy: Secondary | ICD-10-CM | POA: Diagnosis not present

## 2022-05-19 LAB — CBC WITH DIFFERENTIAL (CANCER CENTER ONLY)
Abs Immature Granulocytes: 0.01 10*3/uL (ref 0.00–0.07)
Basophils Absolute: 0 10*3/uL (ref 0.0–0.1)
Basophils Relative: 1 %
Eosinophils Absolute: 0.2 10*3/uL (ref 0.0–0.5)
Eosinophils Relative: 3 %
HCT: 37.9 % (ref 36.0–46.0)
Hemoglobin: 12.1 g/dL (ref 12.0–15.0)
Immature Granulocytes: 0 %
Lymphocytes Relative: 11 %
Lymphs Abs: 0.6 10*3/uL — ABNORMAL LOW (ref 0.7–4.0)
MCH: 28.6 pg (ref 26.0–34.0)
MCHC: 31.9 g/dL (ref 30.0–36.0)
MCV: 89.6 fL (ref 80.0–100.0)
Monocytes Absolute: 0.4 10*3/uL (ref 0.1–1.0)
Monocytes Relative: 7 %
Neutro Abs: 4.3 10*3/uL (ref 1.7–7.7)
Neutrophils Relative %: 78 %
Platelet Count: 222 10*3/uL (ref 150–400)
RBC: 4.23 MIL/uL (ref 3.87–5.11)
RDW: 14.6 % (ref 11.5–15.5)
WBC Count: 5.5 10*3/uL (ref 4.0–10.5)
nRBC: 0 % (ref 0.0–0.2)

## 2022-05-19 LAB — CMP (CANCER CENTER ONLY)
ALT: 11 U/L (ref 0–44)
AST: 22 U/L (ref 15–41)
Albumin: 4.2 g/dL (ref 3.5–5.0)
Alkaline Phosphatase: 58 U/L (ref 38–126)
Anion gap: 9 (ref 5–15)
BUN: 25 mg/dL — ABNORMAL HIGH (ref 8–23)
CO2: 25 mmol/L (ref 22–32)
Calcium: 10.2 mg/dL (ref 8.9–10.3)
Chloride: 98 mmol/L (ref 98–111)
Creatinine: 0.95 mg/dL (ref 0.44–1.00)
GFR, Estimated: >60 mL/min (ref >60)
Glucose, Bld: 80 mg/dL (ref 70–99)
Potassium: 4.3 mmol/L (ref 3.5–5.1)
Sodium: 132 mmol/L — ABNORMAL LOW (ref 135–145)
Total Bilirubin: 0.3 mg/dL (ref 0.3–1.2)
Total Protein: 7.5 g/dL (ref 6.5–8.1)

## 2022-05-19 MED ORDER — SODIUM CHLORIDE 0.9 % IV SOLN
480.0000 mg | Freq: Once | INTRAVENOUS | Status: AC
  Administered 2022-05-19: 480 mg via INTRAVENOUS
  Filled 2022-05-19: qty 48

## 2022-05-19 MED ORDER — SODIUM CHLORIDE 0.9 % IV SOLN: Freq: Once | INTRAVENOUS | Status: AC

## 2022-05-19 NOTE — Patient Instructions (Signed)
[facility]   Discharge Instructions: Thank you for choosing [facility] to provide your oncology and hematology care.   If you have a lab appointment with the Cancer Center, please go directly to the Cancer Center and check in at the registration area.   Wear comfortable clothing and clothing appropriate for easy access to any Portacath or PICC line.   We strive to give you quality time with your provider. You may need to reschedule your appointment if you arrive late (15 or more minutes).  Arriving late affects you and other patients whose appointments are after yours.  Also, if you miss three or more appointments without notifying the office, you may be dismissed from the clinic at the provider's discretion.      For prescription refill requests, have your pharmacy contact our office and allow 72 hours for refills to be completed.    Today you received the following chemotherapy and/or immunotherapy agents Nivolumab (OPDIVO).      To help prevent nausea and vomiting after your treatment, we encourage you to take your nausea medication as directed.  BELOW ARE SYMPTOMS THAT SHOULD BE REPORTED IMMEDIATELY: *FEVER GREATER THAN 100.4 F (38 C) OR HIGHER *CHILLS OR SWEATING *NAUSEA AND VOMITING THAT IS NOT CONTROLLED WITH YOUR NAUSEA MEDICATION *UNUSUAL SHORTNESS OF BREATH *UNUSUAL BRUISING OR BLEEDING *URINARY PROBLEMS (pain or burning when urinating, or frequent urination) *BOWEL PROBLEMS (unusual diarrhea, constipation, pain near the anus) TENDERNESS IN MOUTH AND THROAT WITH OR WITHOUT PRESENCE OF ULCERS (sore throat, sores in mouth, or a toothache) UNUSUAL RASH, SWELLING OR PAIN  UNUSUAL VAGINAL DISCHARGE OR ITCHING   Items with * indicate a potential emergency and should be followed up as soon as possible or go to the Emergency Department if any problems should occur.  Please show the CHEMOTHERAPY ALERT CARD or IMMUNOTHERAPY ALERT CARD at  check-in to the Emergency Department and triage nurse.  Should you have questions after your visit or need to cancel or reschedule your appointment, please contact [facility]  Dept: [phone]  and follow the prompts.  Office hours are 8:00 a.m. to 4:30 p.m. Monday - Friday. Please note that voicemails left after 4:00 p.m. may not be returned until the following business day.  We are closed weekends and major holidays. You have access to a nurse at all times for urgent questions. Please call the main number to the clinic Dept: [phone] and follow the prompts.   For any non-urgent questions, you may also contact your provider using MyChart. We now offer e-Visits for anyone 18 and older to request care online for non-urgent symptoms. For details visit [email or web address].   Also download the MyChart app! Go to the app store, search "MyChart", open the app, select [facility], and log in with your MyChart username and password.  Masks are optional in the cancer centers. If you would like for your care team to wear a mask while they are taking care of you, please let them know. You may have one support person who is at least 73 years old accompany you for your appointments.  Nivolumab Injection What is this medication? NIVOLUMAB (nye VOL ue mab) treats some types of cancer. It works by helping your immune system slow or stop the spread of cancer cells. It is a monoclonal antibody. This medicine may be used for other purposes; ask your health care provider or pharmacist if you have questions. COMMON BRAND NAME(S): Opdivo   What should I tell my care team before I take this medication? They need to know if you have any of these conditions: Allogeneic stem cell transplant (uses someone else's stem cells) Autoimmune diseases, such as Crohn disease, ulcerative colitis, lupus History of chest radiation Nervous system problems, such as Guillain-Barre syndrome or  myasthenia gravis Organ transplant An unusual or allergic reaction to nivolumab, other medications, foods, dyes, or preservatives Pregnant or trying to get pregnant Breast-feeding How should I use this medication? This medication is infused into a vein. It is given in a hospital or clinic setting. A special MedGuide will be given to you before each treatment. Be sure to read this information carefully each time. Talk to your care team about the use of this medication in children. While it may be prescribed for children as young as 12 years for selected conditions, precautions do apply. Overdosage: If you think you have taken too much of this medicine contact a poison control center or emergency room at once. NOTE: This medicine is only for you. Do not share this medicine with others. What if I miss a dose? Keep appointments for follow-up doses. It is important not to miss your dose. Call your care team if you are unable to keep an appointment. What may interact with this medication? Interactions have not been studied. This list may not describe all possible interactions. Give your health care provider a list of all the medicines, herbs, non-prescription drugs, or dietary supplements you use. Also tell them if you smoke, drink alcohol, or use illegal drugs. Some items may interact with your medicine. What should I watch for while using this medication? Your condition will be monitored carefully while you are receiving this medication. You may need blood work while taking this medication. This medication may cause serious skin reactions. They can happen weeks to months after starting the medication. Contact your care team right away if you notice fevers or flu-like symptoms with a rash. The rash may be red or purple and then turn into blisters or peeling of the skin. You may also notice a red rash with swelling of the face, lips, or lymph nodes in your neck or under your arms. Tell your care team  right away if you have any change in your eyesight. Talk to your care team if you are pregnant or think you might be pregnant. A negative pregnancy test is required before starting this medication. A reliable form of contraception is recommended while taking this medication and for 5 months after the last dose. Talk to your care team about effective forms of contraception. Do not breast-feed while taking this medication and for 5 months after the last dose. What side effects may I notice from receiving this medication? Side effects that you should report to your care team as soon as possible: Allergic reactions--skin rash, itching, hives, swelling of the face, lips, tongue, or throat Dry cough, shortness of breath or trouble breathing Eye pain, redness, irritation, or discharge with blurry or decreased vision Heart muscle inflammation--unusual weakness or fatigue, shortness of breath, chest pain, fast or irregular heartbeat, dizziness, swelling of the ankles, feet, or hands Hormone gland problems--headache, sensitivity to light, unusual weakness or fatigue, dizziness, fast or irregular heartbeat, increased sensitivity to cold or heat, excessive sweating, constipation, hair loss, increased thirst or amount of urine, tremors or shaking, irritability Infusion reactions--chest pain, shortness of breath or trouble breathing, feeling faint or lightheaded Kidney injury (glomerulonephritis)--decrease in the amount of urine, red or   dark brown urine, foamy or bubbly urine, swelling of the ankles, hands, or feet Liver injury--right upper belly pain, loss of appetite, nausea, light-colored stool, dark yellow or brown urine, yellowing skin or eyes, unusual weakness or fatigue Pain, tingling, or numbness in the hands or feet, muscle weakness, change in vision, confusion or trouble speaking, loss of balance or coordination, trouble walking, seizures Rash, fever, and swollen lymph nodes Redness, blistering, peeling,  or loosening of the skin, including inside the mouth Sudden or severe stomach pain, bloody diarrhea, fever, nausea, vomiting Side effects that usually do not require medical attention (report these to your care team if they continue or are bothersome): Bone, joint, or muscle pain Diarrhea Fatigue Loss of appetite Nausea Skin rash This list may not describe all possible side effects. Call your doctor for medical advice about side effects. You may report side effects to FDA at 1-800-FDA-1088. Where should I keep my medication? This medication is given in a hospital or clinic. It will not be stored at home. NOTE: This sheet is a summary. It may not cover all possible information. If you have questions about this medicine, talk to your doctor, pharmacist, or health care provider.  2023 Elsevier/Gold Standard (2021-08-06 00:00:00) 

## 2022-05-19 NOTE — Progress Notes (Signed)
Patient seen by Dr. Sherrill today ? ?Vitals are within treatment parameters. ? ?Labs reviewed by Dr. Sherrill and are within treatment parameters. ? ?Per physician team, patient is ready for treatment and there are NO modifications to the treatment plan.  ?

## 2022-05-19 NOTE — Progress Notes (Signed)
[facility] OFFICE PROGRESS NOTE   Diagnosis: Anal cancer  INTERVAL HISTORY:   Ashley Moore returns as scheduled.  She completed another cycle of nivolumab on 04/21/2022.  No new complaint.  She has swelling at the ankles and feet.  She is working.  She has occasional "tiny clots "in the urine.  She thinks this may be related to using a dilator.  No abdomen or flank pain.  Objective:  Vital signs in last 24 hours:  Blood pressure 128/73, pulse 82, temperature 98.1 F (36.7 C), temperature source Oral, resp. rate 18, height '5\' 2"'$  (1.575 m), weight 164 lb (74.4 kg), SpO2 100 %.    Lymphatics: Pea-sized left scalene node.  No other cervical, supraclavicular, axillary, or inguinal nodes  Resp: Lungs with end inspiratory rales/rhonchi posterior chest bilaterally Cardio: Regular rate and rhythm GI: No hepatomegaly, no mass, nontender Vascular: Trace pitting edema at the ankle bilaterally  Skin: No rash    Lab Results:  Lab Results  Component Value Date   WBC 5.5 05/19/2022   HGB 12.1 05/19/2022   HCT 37.9 05/19/2022   MCV 89.6 05/19/2022   PLT 222 05/19/2022   NEUTROABS 4.3 05/19/2022    CMP  Lab Results  Component Value Date   NA 138 04/21/2022   K 4.4 04/21/2022   CL 104 04/21/2022   CO2 22 04/21/2022   GLUCOSE 76 04/21/2022   BUN 20 04/21/2022   CREATININE 0.82 04/21/2022   CALCIUM 9.8 04/21/2022   PROT 6.9 04/21/2022   ALBUMIN 4.0 04/21/2022   AST 24 04/21/2022   ALT 10 04/21/2022   ALKPHOS 55 04/21/2022   BILITOT 0.3 04/21/2022   GFRNONAA >60 04/21/2022   GFRAA >60 06/17/2020    Lab Results  Component Value Date   CEA1 1.15 03/04/2021   CEA 1.16 03/04/2021    Medications: I have reviewed the patient's current medications.   Assessment/Plan: Anal cancer CT abdomen/pelvis 10/21/2016-thickening of the anus extending to the junction between the anus and rectum with a large stool ball in the rectum and mild fat stranding posterior to the  rectum. Enlarged lymph nodes in the right and left inguinal regions. A few mildly prominent nodes seen posterior to the rectum. 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 PET scan [id number] hypermetabolic anal mass with hypermetabolic metastases to the groin region bilaterally, left pelvic sidewall and presacral space. Initiation of radiation and cycle 1 5-FU/mitomycin C 11/14/2016 Cycle 2 5-FU/mitomycin C 12/12/2016 (5-FU dose reduced due to mucositis, diarrhea, skin breakdown) Radiation completed 12/23/2016 CT abdomen/pelvis 04/14/2017-resolution of anal mass and bilateral inguinal lymphadenopathy. No residual tumor seen. CT abdomen/pelvis 01/28/2020-right hydroureteronephrosis, transition at the level of the mid right ureter, retroperitoneal lymphadenopathy CT renal stone study 01/28/2020-severe right hydronephrosis and proximal right hydroureter, 8 mm area of increased attenuation in the proximal right ureter-stone versus soft tissue mass PET 02/13/2020-hypermetabolic right retroperitoneal nodal metastases in the aortocaval and posterior pericaval chains, nonspecific mild anal wall hypermetabolism, right nephroureteral stent 02/21/2020 anorectal exam per Dr. Ashley Jacobs radiation changes of the skin around the anal margin.  Posterior midline smooth scarring.  Mildly stenotic.  Stenosis seems better.  No palpable concerning lesions.  Entire anal canal and distal rectum feels smooth and healthy.  Partial anoscopy performed with no lesions in the anal canal. Xeloda/radiation beginning 03/02/2020, completed 04/13/2020 CT abdomen/pelvis 06/17/2020-resolution of right retroperitoneal lymphadenopathy, no remaining pathologically enlarged lymph nodes, residual mild right hydronephrosis, no evidence of progressive disease, stable anal wall thickening Digital rectal  exam by Dr. Quentin Cornwall 08/26/2020-posterior midline smooth scarring.  Mildly stenotic.  Stenosis seems better.  No  palpable concerning lesions.  Anal canal feels smooth without palpable concern.  Unable to tolerate anoscopy.  Next visit in 6 months for surveillance. CT abdomen/pelvis 10/28/2020-no evidence of local recurrence or metastatic disease.  Stable mild rectal wall thickening.  New diffuse bladder wall thickening possibly related to prior radiation CT abdomen/pelvis 01/16/2021- recurrent right-sided hydronephrosis and proximal right hydroureter status post removal of right ureter stent, new soft tissue nodule along the course of the right ureter between the right psoas and IVC suspicious for recurrent lymphadenopathy PET 02/12/2021-small focus of hypermetabolism at the anorectal junction without a definable mass, right retroperitoneal nodal metastasis with obstruction of the proximal right ureter, right middle lobe hypermetabolic nodule, hypermetabolism in the right hilum without a defined nodal mass, hypermetabolic left supraclavicular and left axillary nodes 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 Cycle 1 Taxol/carboplatin 03/11/2021 Cycle 2 Taxol/carboplatin 03/31/2021, Fulphila Cycle 3 Taxol/carboplatin 04/21/2021, Fulphila CTs 05/11/2021-decrease size of FDG avid nodule in right middle lobe, right middle lobe subpleural nodule slightly enlarged, decreased in size of left axillary and subpectoral nodes, unchanged FDG avid right iliac nodes Cycle 4 Taxol/carboplatin 05/12/2021, G-CSF discontinued secondary to poor tolerance, Taxol and carboplatin dose reduced Cycle 5 Taxol/carboplatin 06/02/2021 Cycle 6 carboplatin 07/01/2021, Taxol held due to neuropathy Cycle 7 carboplatin 07/29/2021, Taxol held due to neuropathy CTs 08/16/2021-new liver lesion, increase in right psoas lymph node, unchanged right hydronephrosis, new mediastinal and right hilar lymphadenopathy, enlargement of bilateral pulmonary nodules and at least one new nodule, new subcarinal  node Cycle 1 nivolumab 08/25/2021 Cycle 2 nivolumab 09/08/2021 Cycle 3 nivolumab 09/22/2021 Cycle 4 nivolumab 10/06/2021 Cycle 5 nivolumab 10/20/2021 Cycle 6 nivolumab 11/04/2021 CTs 11/12/2021-resolution of lung nodules, decreased size of right psoas node, no remaining mediastinal lymphadenopathy, resolution of right liver lesion Cycle 7 nivolumab 11/17/2021 Cycle 8 nivolumab 12/01/2021 Cycle 9 nivolumab 12/15/2021 Cycle 10 nivolumab 12/30/2021 Cycle 11 nivolumab 01/12/2022 Cycle 12 nivolumab 5/10/260-monthy dosing Cycle 13 nivolumab 02/23/2022 CT 03/16/2022-stable right psoas mass, no pulmonary nodules, persistent circumferential anal thickening, unchanged subcentimeter left liver lesion Cycle 14 nivolumab 03/24/2022 Cycle 15 nivolumab 04/21/2022 Cycle 16 nivolumab 05/19/2022 Left labial lesions. Question direct extension from the anal cancer versus metastatic disease from anal cancer versus a separate malignant process. 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 Multiple family members with breast cancer. History of hypokalemia-likely secondary to decreased nutritional intake and diarrhea; potassium in normal range 01/16/2017. No longer taking a potassium supplement. Right hydroureteronephrosis on CT 01/28/2020, status post a cystoscopy/pyelogram 01/28/2020 confirming extrinsic compression of the right ureter, status post stent placement Right ureter stent exchange 06/12/2020 Right ureter stent removed 01/13/2021   8.  Admission 01/07/2021 with Klebsiella pneumoniae urosepsis/bacteremia  9.  Hospital admission 05/15/2021 with UTI/possible right pyelonephritis     Disposition: Ms. KMancinoappears stable.  She is tolerating the nivolumab well.  No clinical evidence of disease progression.  She will complete another cycle of nivolumab today.  She will return for an office and lab visit in 1 month.  We  will plan for restaging CTs in November or December.  GBetsy Coder MD  05/19/2022  11:44 AM

## 2022-05-20 LAB — T4: T4, Total: 8.7 ug/dL (ref 4.5–12.0)

## 2022-05-24 ENCOUNTER — Encounter: Payer: Self-pay | Admitting: Physical Therapy

## 2022-05-24 ENCOUNTER — Ambulatory Visit: Payer: Medicare HMO | Admitting: Physical Therapy

## 2022-05-24 DIAGNOSIS — M25511 Pain in right shoulder: Secondary | ICD-10-CM

## 2022-05-24 DIAGNOSIS — G8929 Other chronic pain: Secondary | ICD-10-CM

## 2022-05-24 DIAGNOSIS — R600 Localized edema: Secondary | ICD-10-CM | POA: Diagnosis not present

## 2022-05-24 DIAGNOSIS — R293 Abnormal posture: Secondary | ICD-10-CM | POA: Diagnosis not present

## 2022-05-24 DIAGNOSIS — M6281 Muscle weakness (generalized): Secondary | ICD-10-CM | POA: Diagnosis not present

## 2022-05-24 DIAGNOSIS — M25611 Stiffness of right shoulder, not elsewhere classified: Secondary | ICD-10-CM | POA: Diagnosis not present

## 2022-05-24 NOTE — Therapy (Signed)
OUTPATIENT PHYSICAL THERAPY TREATMENT NOTE          Patient Name: Ashley Moore MRN: [id number] DOB:07-01-1951, 71 y.o., female Today's Date: 05/24/2022  PCP: [name], MD REFERRING PROVIDER: No ref. provider found    PT End of Session - 05/24/22 1441     Visit Number 21    Number of Visits 22    Date for PT Re-Evaluation 06/14/22    Authorization Type [employer] 12 visits 4/18 - 03/11/2022    Authorization Time Period 12 visits 4/18 - 03/11/2022, resubmitted on 03/14/22 for 12 more visits through 06/14/22    Progress Note Due on Visit 20    PT Start Time 1434    PT Stop Time 1515    PT Time Calculation (min) 41 min    Activity Tolerance Patient limited by pain    Behavior During Therapy [facility] for tasks assessed/performed                    PT End of Session - 05/24/22 1441     Visit Number 21    Number of Visits 22    Date for PT Re-Evaluation 06/14/22    Authorization Type [employer] 12 visits 4/18 - 03/11/2022    Authorization Time Period 12 visits 4/18 - 03/11/2022, resubmitted on 03/14/22 for 12 more visits through 06/14/22    Progress Note Due on Visit 20    PT Start Time 1434    PT Stop Time 1515    PT Time Calculation (min) 41 min    Activity Tolerance Patient limited by pain    Behavior During Therapy [facility] for tasks assessed/performed                          Past Medical History:  Diagnosis Date   Arthritis    Bowen's disease    excised 1992   Chronic low back pain    Chronic radiation cystitis    DDD (degenerative disc disease), cervical    Family history of breast cancer    Family history of lung cancer    Family history of non-Hodgkin's lymphoma    Family history of ovarian cancer    Headache    Hx of varicella    Hydronephrosis of right kidney    urologist--- dr Tresa Moore,  malignant,  treated with ureter stent   Hypothyroidism    followed by pcp   Lower urinary tract symptoms (LUTS)    01-12-2021  per pt treated with  accupuncture at dr Tresa Moore office   Lymphedema of both lower extremities    PICC (peripherally inserted central catheter) in place 01/11/2021   placed at [facility] in [place], [place] for IV antibiotics   Positive blood culture 01/08/2021   Klebsiella Pneumaniae,  treated with daily IV antibiotic   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[facility]) oncologist--- dr Benay Spice   dx 02/ 2018,  invasive SCC , completed chemo/ radiation 12-23-2016;  recurrent metastatsis retroperitoneal lymphdenopathy,  completed radiation 04-13-2020 residual right ureteral uropathy obstruction   Retroperitoneal lymphadenopathy    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   Sepsis due to Klebsiella pneumoniae [facility]) 01/07/2021   pt admitted to [facility] in [place],  dx sepsis secondary to acute pyelonephritis with bacteremia , positive blood culture,  discharged 01-11-2021 home daily IV antibiotic   Wears glasses    Past Surgical History:  Procedure Laterality Date   BUNIONECTOMY Right yrs  ago   [place] Right 06/12/2020   Procedure: CYSTOSCOPY WITH RETROGRADE PYELOGRAM/URETERAL STENT EXCHANGE;  Surgeon: Alexis Frock, MD;  Location: [facility];  Service: Urology;  Laterality: Right;   CYSTOSCOPY W/ URETERAL STENT PLACEMENT Right 01/13/2021   Procedure: CYSTOSCOPY WITH RETROGRADE PYELOGRAM/URETERAL STENT REMOVALRIGHT;  Surgeon: Alexis Frock, MD;  Location: [facility];  Service: Urology;  Laterality: Right;  [place], URETEROSCOPY AND STENT PLACEMENT Right 01/28/2020   Procedure: CYSTOSCOPY WITH RETROGRADE PYELOGRAM, URETEROSCOPY AND STENT PLACEMENT;  Surgeon: Alexis Frock, MD;  Location: [facility];  Service: Urology;  Laterality: Right;   IR GENERIC HISTORICAL  11/14/2016   IR US GUIDE VASC ACCESS RIGHT 11/14/2016 [facility]   IR GENERIC HISTORICAL  11/14/2016   IR FLUORO GUIDE CV LINE RIGHT  11/14/2016 [facility]   IR GENERIC HISTORICAL  12/12/2016   IR US GUIDE VASC ACCESS RIGHT 12/12/2016 [name], MD [facility]   IR GENERIC HISTORICAL  12/12/2016   IR FLUORO GUIDE CV LINE RIGHT 12/12/2016 [name], MD [facility]   SKIN CANCER EXCISION  1990   bowens disease   Patient Active Problem List   Diagnosis Date Noted   Primary osteoarthritis, 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/28/2021   Atypical chest pain 06/29/2021   Intractable abdominal pain 05/16/2021   Acute pyelonephritis 05/16/2021   Acute cystitis without hematuria 05/16/2021   Hydronephrosis of right kidney 05/16/2021   Obstructive uropathy 05/16/2021   Hypothyroidism 05/16/2021   Hyponatremia 05/16/2021   Obesity (BMI 30-39.9) 05/16/2021   Goals of care, counseling/discussion 03/01/2021   SIRS (systemic inflammatory response syndrome) ([place]) 01/13/2021   Pyelonephritis 01/13/2021   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   Genetic testing 01/27/2020   Family history of breast cancer    Family history of lung cancer    Family history of ovarian cancer    Family history of non-Hodgkin's lymphoma    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   Port catheter in place 11/21/2016   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   Anal cancer ([place]) 11/03/2016   Cystitis 05/06/2015   Degenerative cervical disc 04/08/2015   Trigger point of neck 03/26/2015   Pain in joint, ankle and foot 04/23/2013   Visit for preventive health examination 02/13/2013   Routine gynecological examination 02/13/2013   Family history of breast cancer in first degree relative 02/13/2013   Rash and nonspecific skin eruption 02/13/2013   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   VITAMIN D DEFICIENCY 06/25/2008   NECK PAIN 06/02/2008   FOOT PAIN 06/02/2008   Osteopenia 06/02/2008    REFERRING DIAG: M25.511,G89.29 (ICD-10-CM) - Chronic right shoulder pain   THERAPY DIAG:  Stiffness of right shoulder, not elsewhere  classified  Muscle weakness (generalized)  Chronic right shoulder pain  Localized edema  Abnormal posture  PERTINENT HISTORY: Arthritis Bowen's diseaseexcised 1992 Chronic low back pain Chronic radiation cystitis DDD (degenerative disc disease), cervical Family history of breast cancer Family history of lung cancer Family history of non-Hodgkin's lymphoma Family history of ovarian cancer Headache Hx of varicella Hydronephrosis of right kidney rectal cancer   PRECAUTIONS: Other: infusion therapy for Cancer , fall precuations   SUBJECTIVE: Pt reporting no pain at rest. Pt stating 6-7/10 pain with movements  PAIN:  Are you having pain? yes, only with movements of Rt shoulder reaching out and upward NPRS scale: 6-7/10 Pain location: anterior shoulder and lateral Rt shoulder into elbow Pain orientation: Right  PAIN  TYPE: aching Pain description: constant  Aggravating factors: reaching upward, lateral movements Relieving factors: resting and heat    OBJECTIVE:      PATIENT SURVEYS:  FOTO: 51% on 10/19/2021 FOTO: 53% on 01/04/2022 FOTO: 54.3% 03/14/2022 FOTO: 61.4% 05/10/22  UPPER EXTREMITY AROM/PROM:   Active ROM  In supine Right 10/19/2021 Left 10/19/2021 Rt  02/15/22 Rt 03/01/2022  Rt 03/14/22 Rt 04/18/22 supine  Shoulder flexion 120 164 130 140 142 142  Shoulder extension 30 45   50      Shoulder abduction 98 160 125  130 130  Shoulder adduction          Shoulder internal rotation 75 shoulder abd 45 degrees 75 75 Shoulder abd 45 deg  75  Shoulder abd 45 deg 75 Shoulder abd 45 deg  Shoulder external rotation 15 Shoulder abd 45 degrees 80 10 Shoulder abd 45 deg  0 Shoulder abd 45 deg 0  Shoulder abd 45 deg   Elbow flexion 140   145 145     Elbow extension 0 0 0     Wrist flexion          Wrist extension          Wrist ulnar deviation          Wrist radial deviation          Wrist pronation          Wrist supination          (Blank rows = not tested)    Passive ROM In degrees Right 10/19/21 Right 11/09/21 Right  11/16/21 Right 12/06/21 Rt 12/13/21 Rt 01/04/22 supine Rt 02/15/22 supine Rt 03/14/22 Rt 04/12/22 supine Rt 04/18/22 supine  Shoulder flexion 130  120 134 145 145 155 160  154 152  Shoulder extension 35            Shoulder abduction 105 104  110 120 120 150 155  125 124  Shoulder adduction              Shoulder internal rotation 80 shoulder abd 45 degrees    80 shoulder  abducted 45 degrees 78 shoulder  abducted 45 degrees 75 degrees (Shoulder abd 45 degrees) 75 deg Shoulder abd 45 deg 75 Shoulder abd 45 deg 72 Shoulder abd 45 deg 72  Shoulder external rotation 20 Shoulder abd 45 degrees 30 Shoulder abducted to 45 degrees   30 Shoulder abducted to 45 degrees 30 Shoulder abducted to 45 degrees 28 degrees Shoulder abd 45 degrees) 30 deg Shoulder abd 45 deg 10  Shoulder abd 45 deg 0 Shoulder abd 45 deg 5  Shoulder abd  45 deg  Elbow flexion              Elbow extension              Wrist flexion              Wrist extension              Wrist ulnar deviation              Wrist radial deviation              Wrist pronation              Wrist supination                  UPPER EXTREMITY MMT:   MMT Right 10/19/2021 Left 10/19/2021 Right 12/06/2021 Rt 12/28/2021 Rt 03/07/22 Rt 03/14/2022 Rt  05/17/22 Rt 05/24/22  Shoulder flexion 2+/5 4+/5 3-/5 3/5 3+/5 3+/5 3+/5   Shoulder extension 2+/5 4+/5 3-/5 3/5 3+/5 3+/5 3+/5   Shoulder abduction 2+/5 4+/5 3-/5 3/5 3+/5 3+/5 3+/5   Shoulder adduction            Shoulder internal rotation 2+/5 4+/5 3-/5 3/5 3+/5 3+/5 3+/5   Shoulder external rotation 2+/5 4+/5 3-/5 3/5 3+/5 2+/5 tested at 0 deg 3-/5 Tested at 0 degrees   Middle trapezius            Lower trapezius            Elbow flexion            Elbow extension            Wrist flexion            Wrist extension            Wrist ulnar deviation            Wrist radial deviation            Wrist pronation             Wrist supination            Grip strength (lbs) 31.8 pounds 48 pounds    29.8 pounds  38.2 pounds  41.1 ppsi   (Blank rows = not tested)                  TODAY'S TREATMENT:   05/24/22: TherEx: Pulleys x 4 minutes, flexion and scaption UBE: 3 minutes forward and backward at Level 2.5 Supine AAROM shoulder flexion 3 # bar x 10 Standing Rows: level 4 band x 20 Bicep curls 2 # x 20 Rt UE Rows: x 20 Level 4 band Walking 2# ball up and down wall x 10 Seated forearm pronation/supination with large velcro roller  Manual: PROM: Rt shoulder flexion, ER, abduction, IR, grade 2-3 AP GH mobs   05/17/2022:  TherEx: Pulleys x 3 minutes, scaption UBE: 4 minutes forward and backward at Level 2.5 Supine AAROM shoulder flexion 2# bar x 60from 90 degrees to 120 degrees Seated Rows: level 3 band 2 x 10 Chest press at 90 degrees x 20  Manual: PROM: Rt shoulder flexion, ER, abduction, IR, grade 2-3 AP GH mobs Modaliites Moist heat placed on pt's Rt shoulder during supine AAOM exercises and PROM   05/10/2022:  TherEx: Pulleys x 4 minutes, flexion and scaption UBE: 3 minutes forward and backward at Level 2.5 Supine AAROM shoulder flexion 3 # bar x 10 Seated Rows: level 3 band 2 x 10 Lifting 1# weight over small physioball x 10 Lifting 2# weighted ball over dumbell x 10 Walking 2# ball up and down wall x 10 Manual: PROM: Rt shoulder flexion, ER, abduction, IR, grade 2-3 AP GH mobs Modaliites Moist heat x 5 minutes at end of session             PATIENT EDUCATION: Education details:added isometrics to pt's HEP  Person educated: Patient Education method: Explanation Education comprehension: verbalized understanding, returned demonstration, and verbal cues required     HOME EXERCISE PROGRAM: Access Code: [email or web address] URL: [email or web address] Date: 10/19/2021 Prepared by: Kearney Hard   Exercises Seated Shoulder Flexion Towel Slide at Table Top - 2-3 x  daily - 7 x weekly - 1 sets - 10 reps - 5-10 seocnds hold Seated Shoulder External Rotation PROM on Table - 2-3 x daily - 7 x weekly -  1 sets - 10 reps - 5-10 seconds hold Seated Scapular Retraction - 2-3 x daily - 7 x weekly - 1 sets - 10 reps - 5-10 seconds hold     ASSESSMENT:   CLINICAL IMPRESSION: Pt arriving today reporting no pain at rest and 6-7/10 pain with movements. Pt tolerating manual therapy better today with less pain noted with end ranges flexion and abduction. Pt's Rt grip strength has improved since initial evaluation. Pt still amb with st cane due to recent fall and ankle injury. Pt feels the cane is helping her gain strength in her Rt UE. Plan for discharge next visit.      REHAB POTENTIAL: fair, due to co-morbidities   CLINICAL DECISION MAKING: Evolving/moderate complexity   EVALUATION COMPLEXITY: Moderate     GOALS: Goals reviewed with patient? Yes   SHORT TERM GOALS:   STG Name Target Date Goal status  1 Pt will be independent with her initial HEP.    11/16/2021 Met 11/09/2021  2 Pt will be able to perform 5 time sit to stand with no UE support </= 15 seconds Baseline:  11/16/2021 MET  01/04/2022    LONG TERM GOALS:    LTG Name Target Date Goal status  1 PT will be independent in her advanced HEP.  Baseline: 01/11/2022 MET  01/04/22  2 Pt will improve FOTO score to >/= 63 % Baseline: 53% on 01/04/2022 06/14/22 Ongoing 03/14/2022  3 Pt will improve her right shoulder ER to >/= 30 degrees to improve functional mobility.  Baseline: 06/14/22 Ongoing 03/14/2022  4 Pt will be able to report pain of </= 2/10 with ADL's using her right UE.  Baseline: pain depends on the day 01/04/22 06/14/22 On going  03/14/2022       5.   Pt will be able to reach her left under arm for donning deodorant.  06/14/22 On-going 03/14/2022                      PLAN: PT FREQUENCY: 1x/ week for 12 weeks Approved for 12 visits from 01/04/2022 -  03/11/2022 from [place] for 12  visits from 03/11/2022 to 06/14/2022 from [place] sent on 9/59/74     PT DURATION:  1 x/ week x 6 weeks   PLANNED INTERVENTIONS: Therapeutic exercises, Therapeutic activity, Neuro Muscular re-education, Balance training, Gait training, Patient/Family education, Joint mobilization, Dry Needling, Cryotherapy, Moist heat, Taping, Vasopneumatic device, and Manual therapy   PLAN FOR NEXT SESSION: Discharge   Kearney Hard, PT, MPT 05/24/22 2:46 PM

## 2022-05-30 ENCOUNTER — Ambulatory Visit: Payer: Medicare HMO | Admitting: Physical Therapy

## 2022-05-30 ENCOUNTER — Encounter: Payer: Self-pay | Admitting: Physical Therapy

## 2022-05-30 DIAGNOSIS — M25611 Stiffness of right shoulder, not elsewhere classified: Secondary | ICD-10-CM

## 2022-05-30 DIAGNOSIS — M6281 Muscle weakness (generalized): Secondary | ICD-10-CM

## 2022-05-30 DIAGNOSIS — G8929 Other chronic pain: Secondary | ICD-10-CM | POA: Diagnosis not present

## 2022-05-30 DIAGNOSIS — R600 Localized edema: Secondary | ICD-10-CM

## 2022-05-30 DIAGNOSIS — R293 Abnormal posture: Secondary | ICD-10-CM

## 2022-05-30 DIAGNOSIS — M25511 Pain in right shoulder: Secondary | ICD-10-CM | POA: Diagnosis not present

## 2022-05-30 NOTE — Therapy (Signed)
OUTPATIENT PHYSICAL THERAPY TREATMENT NOTE Discharge          Patient Name: Ashley Moore MRN: [id number] DOB:Mar 07, 1951, 71 y.o., female Today's Date: 05/30/2022  PCP: [name], MD REFERRING PROVIDER: Persons, [name], [place]    PT End of Session - 05/30/22 1522     Visit Number 22    Number of Visits 22    Date for PT Re-Evaluation 06/14/22    Authorization Type HU[employer] 12 visits 4/18 - 03/11/2022    Authorization Time Period 12 visits 4/18 - 03/11/2022, resubmitted on 03/14/22 for 12 more visits through 06/14/22    PT Start Time 1434    PT Stop Time 1520    PT Time Calculation (min) 46 min    Activity Tolerance Patient limited by pain    Behavior During Therapy [facility] for tasks assessed/performed                     PT End of Session - 05/30/22 1522     Visit Number 22    Number of Visits 22    Date for PT Re-Evaluation 06/14/22    Authorization Type HU[employer] 12 visits 4/18 - 03/11/2022    Authorization Time Period 12 visits 4/18 - 03/11/2022, resubmitted on 03/14/22 for 12 more visits through 06/14/22    PT Start Time 1434    PT Stop Time 1520    PT Time Calculation (min) 46 min    Activity Tolerance Patient limited by pain    Behavior During Therapy [facility] for tasks assessed/performed                           Past Medical History:  Diagnosis Date   Arthritis    Bowen's disease    excised 1992   Chronic low back pain    Chronic radiation cystitis    DDD (degenerative disc disease), cervical    Family history of breast cancer    Family history of lung cancer    Family history of non-Hodgkin's lymphoma    Family history of ovarian cancer    Headache    Hx of varicella    Hydronephrosis of right kidney    urologist--- dr Tresa Moore,  malignant,  treated with ureter stent   Hypothyroidism    followed by pcp   Lower urinary tract symptoms (LUTS)    01-12-2021  per pt treated with accupuncture at dr Tresa Moore office   Lymphedema of both  lower extremities    PICC (peripherally inserted central catheter) in place 01/11/2021   placed at [facility] in [place], [place] for IV antibiotics   Positive blood culture 01/08/2021   Klebsiella Pneumaniae,  treated with daily IV antibiotic   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[facility]) oncologist--- dr Benay Spice   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,  invasive SCC , completed chemo/ radiation 12-23-2016;  recurrent metastatsis retroperitoneal lymphdenopathy,  completed radiation 04-13-2020 residual right ureteral uropathy obstruction   Retroperitoneal lymphadenopathy    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   Sepsis due to Klebsiella pneumoniae ([place]) 01/07/2021   pt admitted to [facility] in [place],  dx sepsis secondary to acute pyelonephritis with bacteremia , positive blood culture,  discharged 01-11-2021 home daily IV antibiotic   Wears glasses    Past Surgical History:  Procedure Laterality Date   BUNIONECTOMY Right yrs ago   [place] Right 06/12/2020   Procedure: CYSTOSCOPY WITH  RETROGRADE PYELOGRAM/URETERAL STENT EXCHANGE;  Surgeon: Alexis Frock, MD;  Location: [facility];  Service: Urology;  Laterality: Right;   CYSTOSCOPY W/ URETERAL STENT PLACEMENT Right 01/13/2021   Procedure: CYSTOSCOPY WITH RETROGRADE PYELOGRAM/URETERAL STENT REMOVALRIGHT;  Surgeon: Alexis Frock, MD;  Location: [facility];  Service: Urology;  Laterality: Right;  [place], URETEROSCOPY AND STENT PLACEMENT Right 01/28/2020   Procedure: CYSTOSCOPY WITH RETROGRADE PYELOGRAM, URETEROSCOPY AND STENT PLACEMENT;  Surgeon: Alexis Frock, MD;  Location: [facility];  Service: Urology;  Laterality: Right;   IR GENERIC HISTORICAL  11/14/2016   IR US GUIDE VASC ACCESS RIGHT 11/14/2016 [facility]   IR GENERIC HISTORICAL  11/14/2016   IR FLUORO GUIDE CV LINE RIGHT 11/14/2016 [facility]   IR GENERIC HISTORICAL   12/12/2016   IR US GUIDE VASC ACCESS RIGHT 12/12/2016 [name], MD [facility]   IR GENERIC HISTORICAL  12/12/2016   IR FLUORO GUIDE CV LINE RIGHT 12/12/2016 [name], MD [facility]   SKIN CANCER EXCISION  1990   bowens disease   Patient Active Problem List   Diagnosis Date Noted   Primary osteoarthritis, 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/28/2021   Atypical chest pain 06/29/2021   Intractable abdominal pain 05/16/2021   Acute pyelonephritis 05/16/2021   Acute cystitis without hematuria 05/16/2021   Hydronephrosis of right kidney 05/16/2021   Obstructive uropathy 05/16/2021   Hypothyroidism 05/16/2021   Hyponatremia 05/16/2021   Obesity (BMI 30-39.9) 05/16/2021   Goals of care, counseling/discussion 03/01/2021   SIRS (systemic inflammatory response syndrome) ([place]) 01/13/2021   Pyelonephritis 01/13/2021   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   Genetic testing 01/27/2020   Family history of breast cancer    Family history of lung cancer    Family history of ovarian cancer    Family history of non-Hodgkin's lymphoma    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   Port catheter in place 11/21/2016   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   Anal cancer ([place]) 11/03/2016   Cystitis 05/06/2015   Degenerative cervical disc 04/08/2015   Trigger point of neck 03/26/2015   Pain in joint, ankle and foot 04/23/2013   Visit for preventive health examination 02/13/2013   Routine gynecological examination 02/13/2013   Family history of breast cancer in first degree relative 02/13/2013   Rash and nonspecific skin eruption 02/13/2013   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   VITAMIN D DEFICIENCY 06/25/2008   NECK PAIN 06/02/2008   FOOT PAIN 06/02/2008   Osteopenia 06/02/2008    REFERRING DIAG: M25.511,G89.29 (ICD-10-CM) - Chronic right shoulder pain   THERAPY DIAG:  Stiffness of right shoulder, not elsewhere classified  Muscle weakness (generalized)  Chronic right shoulder  pain  Localized edema  Abnormal posture  PERTINENT HISTORY: Arthritis Bowen's diseaseexcised 1992 Chronic low back pain Chronic radiation cystitis DDD (degenerative disc disease), cervical Family history of breast cancer Family history of lung cancer Family history of non-Hodgkin's lymphoma Family history of ovarian cancer Headache Hx of varicella Hydronephrosis of right kidney rectal cancer   PRECAUTIONS: Other: infusion therapy for Cancer , fall precuations   SUBJECTIVE: Pt reporting no pain at rest. Pt stating 6-7/10 pain with movements  PAIN:  Are you having pain? yes, only with movements of Rt shoulder reaching out and upward NPRS scale: 6-7/10 Pain location: anterior shoulder and lateral Rt shoulder into elbow Pain orientation: Right  PAIN TYPE: aching Pain description: constant  Aggravating factors: reaching upward, lateral movements Relieving factors: resting  and heat    OBJECTIVE:      PATIENT SURVEYS:  FOTO: 51% on 10/19/2021 FOTO: 53% on 01/04/2022 FOTO: 54.3% 03/14/2022 FOTO: 61.4% 05/10/22 FOTO: 66% 05/30/22   UPPER EXTREMITY AROM/PROM:   Active ROM  In supine Right 10/19/2021 Left 10/19/2021 Rt  02/15/22 Rt 03/01/2022  Rt 03/14/22 Rt 04/18/22 supine Rt 05/30/22 supine  Shoulder flexion 120 164 130 140 142 142 150  Shoulder extension 30 45   50       Shoulder abduction 98 160 125  130 130 140  Shoulder adduction           Shoulder internal rotation 75 shoulder abd 45 degrees 75 75 Shoulder abd 45 deg  75  Shoulder abd 45 deg 75 Shoulder abd 45 deg   Shoulder external rotation 15 Shoulder abd 45 degrees 80 10 Shoulder abd 45 deg  0 Shoulder abd 45 deg 0  Shoulder abd 45 deg    Elbow flexion 140   145 145      Elbow extension 0 0 0      Wrist flexion           Wrist extension           Wrist ulnar deviation           Wrist radial deviation           Wrist pronation           Wrist supination           (Blank rows = not tested)   Passive  ROM In degrees Right 10/19/21 Right 11/09/21 Right  11/16/21 Right 12/06/21 Rt 12/13/21 Rt 01/04/22 supine Rt 02/15/22 supine Rt 03/14/22 Rt 04/12/22 supine Rt 04/18/22 supine  Shoulder flexion 130  120 134 145 145 155 160  154 152  Shoulder extension 35            Shoulder abduction 105 104  110 120 120 150 155  125 124  Shoulder adduction              Shoulder internal rotation 80 shoulder abd 45 degrees    80 shoulder  abducted 45 degrees 78 shoulder  abducted 45 degrees 75 degrees (Shoulder abd 45 degrees) 75 deg Shoulder abd 45 deg 75 Shoulder abd 45 deg 72 Shoulder abd 45 deg 72  Shoulder external rotation 20 Shoulder abd 45 degrees 30 Shoulder abducted to 45 degrees   30 Shoulder abducted to 45 degrees 30 Shoulder abducted to 45 degrees 28 degrees Shoulder abd 45 degrees) 30 deg Shoulder abd 45 deg 10  Shoulder abd 45 deg 0 Shoulder abd 45 deg 5  Shoulder abd  45 deg  Elbow flexion              Elbow extension              Wrist flexion              Wrist extension              Wrist ulnar deviation              Wrist radial deviation              Wrist pronation              Wrist supination                  UPPER EXTREMITY MMT:   MMT Right 10/19/2021 Left 10/19/2021 Right 12/06/2021 Rt  12/28/2021 Rt 03/07/22 Rt 03/14/2022 Rt 05/17/22 Rt 05/24/22 Rt 05/30/22  Shoulder flexion 2+/5 4+/5 3-/5 3/5 3+/5 3+/5 3+/5  3+/5  Shoulder extension 2+/5 4+/5 3-/5 3/5 3+/5 3+/5 3+/5  3+/5  Shoulder abduction 2+/5 4+/5 3-/5 3/5 3+/5 3+/5 3+/5  3+/5  Shoulder adduction             Shoulder internal rotation 2+/5 4+/5 3-/5 3/5 3+/5 3+/5 3+/5  3+/5  Shoulder external rotation 2+/5 4+/5 3-/5 3/5 3+/5 2+/5 tested at 0 deg 3-/5 Tested at 0 degrees    Middle trapezius             Lower trapezius             Elbow flexion             Elbow extension             Wrist flexion             Wrist extension             Wrist ulnar deviation             Wrist radial deviation              Wrist pronation             Wrist supination             Grip strength (lbs) 31.8 pounds 48 pounds    29.8 pounds  38.2 pounds  41.1 ppsi  42 ppsi  (Blank rows = not tested)                  TODAY'S TREATMENT:   05/30/22:  05/24/22: TherEx: UBE: 3 minutes forward and backward at Level 2.5 Supine AAROM shoulder flexion 3 # bar x 10 Seated shoulder flexion with 2# bar x 20 Seated shoulder abd with 2# bar x 20 Bicep curls 2 # x 10 Rt UE c 1 # weight x 10  Manual: PROM: Rt shoulder flexion, ER, abduction, IR, grade 2-3 AP GH mobs   05/24/22: TherEx: Pulleys x 4 minutes, flexion and scaption UBE: 3 minutes forward and backward at Level 2.5 Supine AAROM shoulder flexion 3 # bar x 10 Standing Rows: level 4 band x 20 Bicep curls 2 # x 20 Rt UE Rows: x 20 Level 4 band Walking 2# ball up and down wall x 10 Seated forearm pronation/supination with large velcro roller  Manual: PROM: Rt shoulder flexion, ER, abduction, IR, grade 2-3 AP GH mobs   05/17/2022:  TherEx: Pulleys x 3 minutes, scaption UBE: 4 minutes forward and backward at Level 2.5 Supine AAROM shoulder flexion 2# bar x 2fom 90 degrees to 120 degrees Seated Rows: level 3 band 2 x 10 Chest press at 90 degrees x 20  Manual: PROM: Rt shoulder flexion, ER, abduction, IR, grade 2-3 AP GH mobs Modaliites Moist heat placed on pt's Rt shoulder during supine AAOM exercises and PROM       PATIENT EDUCATION: Education details:added isometrics to pt's HEP  Person educated: Patient Education method: Explanation Education comprehension: verbalized understanding, returned demonstration, and verbal cues required     HOME EXERCISE PROGRAM: Access Code: [email or web address] URL: [email or web address] Date: 10/19/2021 Prepared by: JKearney Hard  Exercises Seated Shoulder Flexion Towel Slide at Table Top - 2-3 x daily - 7 x weekly - 1 sets - 10 reps - 5-10 seocnds hold Seated Shoulder External Rotation PROM  on Table - 2-3 x daily - 7  x weekly - 1 sets - 10 reps - 5-10 seconds hold Seated Scapular Retraction - 2-3 x daily - 7 x weekly - 1 sets - 10 reps - 5-10 seconds hold     ASSESSMENT:   CLINICAL IMPRESSION: Pt arriving to therapy today with no pain at rest. Still reporting pain with certain end range flexion, abd, and extension of her Rt shoulder. Pt has improved her FOTO score to 66%. Pt has met 4/5 of her LTG's set. I am discharging pt home with HEP for continued mobility.      REHAB POTENTIAL: fair, due to co-morbidities   CLINICAL DECISION MAKING: Evolving/moderate complexity   EVALUATION COMPLEXITY: Moderate     GOALS: Goals reviewed with patient? Yes   SHORT TERM GOALS:   STG Name Target Date Goal status  1 Pt will be independent with her initial HEP.    11/16/2021 Met 11/09/2021  2 Pt will be able to perform 5 time sit to stand with no UE support </= 15 seconds Baseline:  11/16/2021 MET  01/04/2022    LONG TERM GOALS:    LTG Name Target Date Goal status  1 PT will be independent in her advanced HEP.  Baseline: 01/11/2022 MET  01/04/22  2 Pt will improve FOTO score to >/= 63 % Baseline: 53% on 01/04/2022 06/14/22 MET  05/30/22  3 Pt will improve her right shoulder ER to >/= 30 degrees to improve functional mobility.  Baseline: 06/14/22 Not Met 05/30/22  4 Pt will be able to report pain of </= 2/10 with ADL's using her right UE.  Baseline: pain depends on the day 01/04/22 06/14/22 MET  05/30/22       5.   Pt will be able to reach her left under arm for donning deodorant.  06/14/22 MET 05/30/22                      PLAN: PT FREQUENCY: 1x/ week for 12 weeks Approved for 12 visits from 01/04/2022 -  03/11/2022 from [place] for 12 visits from 03/11/2022 to 06/14/2022 from [place] sent on 7/93/90     PT DURATION:  1 x/ week x 6 weeks   PLANNED INTERVENTIONS: Therapeutic exercises, Therapeutic activity, Neuro Muscular re-education, Balance training, Gait  training, Patient/Family education, Joint mobilization, Dry Needling, Cryotherapy, Moist heat, Taping, Vasopneumatic device, and Manual therapy   PLAN FOR NEXT SESSION: Discharge   Kearney Hard, PT, MPT 05/30/22 3:29 PM  PHYSICAL THERAPY DISCHARGE SUMMARY  Visits from Start of Care: 22  Current functional level related to goals / functional outcomes: See above   Remaining deficits: See above   Education / Equipment: HEP   Patient agrees to discharge. Patient goals were partially met. Patient is being discharged due to meeting the stated rehab goals.

## 2022-05-31 ENCOUNTER — Telehealth: Payer: Self-pay | Admitting: Pharmacist

## 2022-05-31 NOTE — Chronic Care Management (AMB) (Signed)
    Chronic Care Management Pharmacy Assistant   Name: Ashley Moore  MRN: [id number] DOB: [birth year]  05/31/22 APPOINTMENT REMINDER   Called Patient No answer, left message of appointment on 06/01/22 at 2:15 via telephone visit with [name], Pharm D.   Notified to have all medications, supplements, blood pressure and/or blood sugar logs available during appointment and to return call if need to reschedule.    Care Gaps: Zoster Vaccine - Overdue COVID Booster - Overdue Colonoscopy - Overdue Flu Vaccine - Overdue  Star Rating Drug: None     Medications: Outpatient Encounter Medications as of 05/31/2022  Medication Sig   acetaminophen (TYLENOL) 500 MG tablet Take 1,000 mg by mouth 2 (two) times daily.   Ascorbic Acid (VITAMIN C) 1000 MG tablet Take 1,000 mg by mouth daily.   azelastine (ASTELIN) 0.1 % nasal spray Place 2 sprays into both nostrils 2 (two) times daily. Use in each nostril as directed   Cholecalciferol (VITAMIN D3 PO) Take 10,000 Units by mouth daily.   conjugated estrogens (PREMARIN) vaginal cream Place 1 Applicatorful vaginally daily.   diclofenac Sodium (VOLTAREN) 1 % GEL Apply 2 g topically daily. To shoulder   levothyroxine (SYNTHROID) 75 MCG tablet TAKE 1 TABLET EVERY DAY   loratadine (CLARITIN) 10 MG tablet Take 10 mg by mouth daily.   LORazepam (ATIVAN) 0.5 MG tablet Take 1 tablet (0.5 mg total) by mouth every 8 (eight) hours as needed for anxiety (as needed).   MYRBETRIQ 50 MG TB24 tablet Take 50 mg by mouth daily.   OVER THE COUNTER MEDICATION Place 1 drop into both eyes daily as needed (dry eyes).   OVER THE COUNTER MEDICATION Gundry MD Total Restore (Patient not taking: Reported on 05/19/2022)   OVER THE COUNTER MEDICATION Take by mouth 2 (two) times daily. GI advantage promotes smooth comfortable digestion with a unique blend of gluten digestive enzymes probiotic and fermented fruit polyphenal (Patient not taking: Reported on 05/19/2022)    OVER THE COUNTER MEDICATION Take 1 Scoop by mouth daily. Essential Orange   Prenatal Vit-Fe Fumarate-FA (PRENATAL PO) Take by mouth daily.   protein supplement shake (PREMIER PROTEIN) LIQD Take 11 oz by mouth daily.   TURMERIC PO Take 800 mg by mouth in the morning and at bedtime.   No facility-administered encounter medications on file as of 05/31/2022.      [place] Clinical Pharmacist Assistant [phone]

## 2022-06-01 ENCOUNTER — Telehealth: Payer: Medicare HMO

## 2022-06-01 NOTE — Progress Notes (Deleted)
Chronic Care Management Pharmacy Note  06/01/2022 Name:  Ashley Moore MRN:  [id number] DOB:  1951/02/01  Summary: Patient reports some uncontrolled joint pain TSH is not at goal  Recommendations/Changes made from today's visit: -Recommended avoidance of NSAIDs and use of Tylenol, diclofenac gel or capsaicin cream -Recommend dose adjustment for levothyroxine due to elevated TSH  Plan: Follow up after discussion with PCP  Subjective: Ashley Moore is an 71 y.o. year old female who is a primary patient of Panosh, Standley Brooking, MD.  The CCM team was consulted for assistance with disease management and care coordination needs.    Engaged with patient by telephone for follow up visit in response to provider referral for pharmacy case management and/or care coordination services.   Consent to Services:  The patient was given information about Chronic Care Management services, agreed to services, and gave verbal consent prior to initiation of services.  Please see initial visit note for detailed documentation.   Patient Care Team: Panosh, Standley Brooking, MD as PCP - General Inda Castle, MD (Inactive) as Attending Physician (Gastroenterology) [name], MD as Consulting Physician (Oncology) Kyung Rudd, MD as Consulting Physician (Radiation Oncology) Pieter Partridge, DO as Consulting Physician (Neurology) [name], NP as Nurse Practitioner (Nurse Practitioner) Marcy Siren, MD as Referring Physician (Obstetrics and Gynecology) Viona Gilmore, [facility] as Pharmacist (Pharmacist)  Recent office visits: 01/20/22 Laurey Morale, MD - Patient presented for UTI with hematuria and other concerns. Prescribed Ciprofloxacin.  06/08/21 Shanon Ace, MD: Patient presented for hospitalization follow up. Repeat TSH for end of October.  Recent consult visits: 05/30/22 Kearney Hard, PT [facility] PT): Patient presented for PT treatment for right shoulder.  05/19/22 Betsy Coder, MD (oncology): Patient presented for anal cancer follow up and Opdivo infusion.  04/21/22 Patient presented to [facility] for Oncology Treatment.   04/21/22 [name], NP (Oncology) - Patient presented for Anal cancer. No medication changes.   04/18/22 Oretha Caprice, PT (PT) - Patient presented for Stiffness of right shoulder and other concerns. No medication changes.   04/12/22 Oretha Caprice, PT (PT) - Patient presented for Stiffness of right shoulder and other concerns. No medication changes.   03/29/22 Nahser, Wonda Cheng, MD (Cardiology) - Patient presented for Coronary artery calcification. No medication changes.   03/28/22 Oretha Caprice, PT (PT) - Patient presented for Stiffness of right shoulder and other concerns. No medication changes.   03/24/22 Patient presented to [facility] for Oncology Treatment.   03/24/22 [name], MD (Oncology) - Patient presented for Anal Cancer. Stopped Polyethylene Glycol.   03/16/22 Patient presented to [facility] for CT Chest ABD/Pelvis w Contrast.   03/14/22 Oretha Caprice, PT (PT) - Patient presented for Stiffness of right shoulder and other concerns. No medication changes.   03/03/22 Janith Lima (Urology) - Claims encounter for Hydronephrosis with uteropelvic junction obstruction and other concerns. No other visit details.   03/01/22 Girtha Rm, PT - Patient presented for Stiffness of right shoulder and other concerns. No medication changes.   02/23/22 Patient presented to [facility] for Oncology Treatment.   02/23/22 [name], NP - Patient presented for Anal cancer and other concerns. Stopped Cipro   01/26/22 Patient presented to [facility] for Infusion.   01/26/22 [name], MD (Oncology) - Patient presented for Anal  Cancer. No medication changes.   01/12/22 Patient  presented to [facility] for Infusion.   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 No medication changes.   12/30/21 [name], MD (Oncology) - Patient presented for Anal cancer. No medication changes.   12/29/21 Delanna Notice, MD (GYN) - Patient presented for Anal squamous cell carcinoma. Prescribed Macrobid.  Hospital visits: Medication Reconciliation was completed by comparing discharge summary, patient's EMR and Pharmacy list, and upon discussion with patient.   Patient presented to [facility] on 04/20/22 due to Sprain of left ankle. Patient was present for 1 hour.   New?Medications Started at [facility] Discharge:?? -started  none   Medication Changes at Hospital Discharge: -Changed  none   Medications Discontinued at Hospital Discharge: -Stopped  none   Medications that remain the same after Hospital Discharge:??  -All other medications will remain the same.   Objective:  Lab Results  Component Value Date   CREATININE 0.95 05/19/2022   BUN 25 (H) 05/19/2022   GFR 54.10 (L) 01/27/2021   GFRNONAA >60 05/19/2022   GFRAA >60 06/17/2020   NA 132 (L) 05/19/2022   K 4.3 05/19/2022   CALCIUM 10.2 05/19/2022   CO2 25 05/19/2022   GLUCOSE 80 05/19/2022    Lab Results  Component Value Date/Time   HGBA1C 5.5 05/11/2015 11:18 AM   GFR 54.10 (L) 01/27/2021 02:54 PM   GFR 81.29 01/08/2020 10:08 AM    Last diabetic Eye exam: No results found for: "HMDIABEYEEXA"  Last diabetic Foot exam: No results found for: "HMDIABFOOTEX"   Lab Results  Component Value Date   CHOL 156 03/29/2022   HDL 94 03/29/2022   LDLCALC 50 03/29/2022   TRIG 56 03/29/2022   CHOLHDL 1.7 03/29/2022       Latest Ref Rng & Units 05/19/2022   11:12 AM 04/21/2022   11:31 AM 03/24/2022    9:25 AM  Hepatic Function  Total Protein 6.5 - 8.1 g/dL 7.5  6.9  7.7   Albumin 3.5 - 5.0 g/dL 4.2  4.0  4.2    AST 15 - 41 U/L $Remo'22  24  23   'kZYIq$ ALT 0 - 44 U/L $Remo'11  10  11   'QsuJq$ Alk Phosphatase 38 - 126 U/L 58  55  62   Total Bilirubin 0.3 - 1.2 mg/dL 0.3  0.3  0.4     Lab Results  Component Value Date/Time   TSH 2.780 04/21/2022 11:30 AM   TSH 7.672 (H) 03/24/2022 09:25 AM   TSH 7.850 (H) 04/21/2021 08:52 AM   TSH 2.84 04/17/2020 01:28 PM   FREET4 0.95 06/23/2021 08:54 AM   FREET4 0.94 04/21/2021 08:52 AM       Latest Ref Rng & Units 05/19/2022   11:12 AM 04/21/2022   11:31 AM 03/24/2022    9:25 AM  CBC  WBC 4.0 - 10.5 K/uL 5.5  3.1  4.1   Hemoglobin 12.0 - 15.0 g/dL 12.1  11.2  11.5   Hematocrit 36.0 - 46.0 % 37.9  34.5  37.0   Platelets 150 - 400 K/uL 222  135  207     Lab Results  Component Value Date/Time   VD25OH 48 08/10/2010 09:31 PM   VD25OH 46 11/11/2008 10:55 PM    Clinical ASCVD: No  The 10-year ASCVD risk score (Arnett DK, et al., 2019) is: 9.8%   Values used to calculate the  score:     Age: 1 years     Sex: Female     Is Non-Hispanic African American: No     Diabetic: No     Tobacco smoker: No     Systolic Blood Pressure: 123 mmHg     Is BP treated: No     HDL Cholesterol: 94 mg/dL     Total Cholesterol: 156 mg/dL       09/22/5752   86:29 AM 11/18/2021    2:56 PM 10/25/2021    2:41 PM  Depression screen PHQ 2/9  Decreased Interest 0 0 1  Down, Depressed, Hopeless 0 0 0  PHQ - 2 Score 0 0 1  Altered sleeping 0 0 0  Tired, decreased energy 2 0 2  Change in appetite 0 0 0  Feeling bad or failure about yourself  0 0 0  Trouble concentrating 0 0 0  Moving slowly or fidgety/restless 0 0 0  Suicidal thoughts 0 0 0  PHQ-9 Score 2 0 3  Difficult doing work/chores Not difficult at all       Social History   Tobacco Use  Smoking Status Former   Packs/day: 1.00   Years: 25.00   Total pack years: 25.00   Types: Cigarettes   Quit date: 06/20/1995   Years since quitting: 26.9  Smokeless Tobacco Never   BP Readings from Last 3 Encounters:  05/19/22 123/69  05/19/22  128/73  04/21/22 139/84   Pulse Readings from Last 3 Encounters:  05/19/22 69  05/19/22 82  04/21/22 68   Wt Readings from Last 3 Encounters:  05/19/22 164 lb (74.4 kg)  03/29/22 163 lb 6.4 oz (74.1 kg)  03/24/22 161 lb 3.2 oz (73.1 kg)   BMI Readings from Last 3 Encounters:  05/19/22 30.00 kg/m  04/21/22 29.89 kg/m  03/29/22 29.89 kg/m    Assessment/Interventions: Review of patient past medical history, allergies, medications, health status, including review of consultants reports, laboratory and other test data, was performed as part of comprehensive evaluation and provision of chronic care management services.   SDOH:  (Social Determinants of Health) assessments and interventions performed: No SDOH Interventions    Flowsheet Row Clinical Support from 11/18/2021 in [facility] [place] [facility] Clinical Support from 12/10/2020 in [facility] Chronic Care Management from 09/02/2020 in [facility] [employer]  SDOH Interventions     Food Insecurity Interventions Intervention Not Indicated Intervention Not Indicated --  Housing Interventions Intervention Not Indicated Intervention Not Indicated --  Transportation Interventions Intervention Not Indicated Intervention Not Indicated Intervention Not Indicated  Financial Strain Interventions Intervention Not Indicated Intervention Not Indicated Intervention Not Indicated  Physical Activity Interventions Intervention Not Indicated Intervention Not Indicated --  Stress Interventions Intervention Not Indicated -- --  Social Connections Interventions Intervention Not Indicated Intervention Not Indicated --      SDOH Screenings   Food Insecurity: No Food Insecurity (11/18/2021)  Housing: Low Risk  (11/18/2021)  Transportation Needs: No Transportation Needs (11/18/2021)  Alcohol Screen: Low Risk  (11/18/2021)  Depression (PHQ2-9): Low Risk  (01/20/2022)  Financial Resource Strain: Low Risk  (11/18/2021)  Physical  Activity: Inactive (11/18/2021)  Social Connections: Moderately Integrated (11/18/2021)  Stress: No Stress Concern Present (11/18/2021)  Tobacco Use: Medium Risk (05/30/2022)    CCM Care Plan  Allergies  Allergen Reactions   Sulfamethoxazole-Trimethoprim Other (See Comments)    C/o  abd pain and constipation   Benadryl [Diphenhydramine] Other (See Comments)    Tingle all over  Melatonin     Tingle all over    Penicillins Rash    Did it involve swelling of the face/tongue/throat, SOB, or low BP? N Did it involve sudden or severe rash/hives, skin peeling, or any reaction on the inside of your mouth or nose? Y Did you need to seek medical attention at a hospital or doctor's office? N When did it last happen? Almost 50 years Ago      If all above answers are "NO", may proceed with cephalosporin use.     Medications Reviewed Today     Reviewed by Sharmon Leyden, PT (Physical Therapist) on 05/30/22 at 1522  Med List Status: <None>   Medication Order Taking? Sig Documenting Provider Last Dose Status Informant  acetaminophen (TYLENOL) 500 MG tablet [id number] No Take 1,000 mg by mouth 2 (two) times daily. [provider] Taking Active Self  Ascorbic Acid (VITAMIN C) 1000 MG tablet [id number] No Take 1,000 mg by mouth daily. [provider] Taking Active   azelastine (ASTELIN) 0.1 % nasal spray [id number] No Place 2 sprays into both nostrils 2 (two) times daily. Use in each nostril as directed Panosh, Neta Mends, MD Taking Active   Cholecalciferol (VITAMIN D3 PO) [id number] No Take 10,000 Units by mouth daily. [provider] Taking Active Self  conjugated estrogens (PREMARIN) vaginal cream [id number] No Place 1 Applicatorful vaginally daily. [provider] Taking Active Self  diclofenac Sodium (VOLTAREN) 1 % GEL [id number] No Apply 2 g topically daily. To shoulder [provider] Taking Active Self  levothyroxine (SYNTHROID) 75 MCG tablet [id number] No  TAKE 1 TABLET EVERY DAY Panosh, Neta Mends, MD Taking Active   loratadine (CLARITIN) 10 MG tablet [id number] No Take 10 mg by mouth daily. [provider] Taking Active   LORazepam (ATIVAN) 0.5 MG tablet [id number] No Take 1 tablet (0.5 mg total) by mouth every 8 (eight) hours as needed for anxiety (as needed). Rana Snare, NP Taking Active   MYRBETRIQ 50 MG TB24 tablet [id number] No Take 50 mg by mouth daily. [provider] Taking Active Self  OVER THE COUNTER MEDICATION [id number] No Place 1 drop into both eyes daily as needed (dry eyes). [provider] Taking Active Self  OVER THE COUNTER MEDICATION [id number] No Gundry MD Total Restore  Patient not taking: Reported on 05/19/2022   [provider] Not Taking Active   OVER THE COUNTER MEDICATION [id number] No Take by mouth 2 (two) times daily. GI advantage promotes smooth comfortable digestion with a unique blend of gluten digestive enzymes probiotic and fermented fruit polyphenal  Patient not taking: Reported on 05/19/2022   [provider] Not Taking Active   OVER THE COUNTER MEDICATION [id number] No Take 1 Scoop by mouth daily. Essential Orange [provider] Taking Active   Prenatal Vit-Fe Fumarate-FA (PRENATAL PO) [id number] No Take by mouth daily. [provider] Taking Active   protein supplement shake (PREMIER PROTEIN) LIQD [id number] No Take 11 oz by mouth daily. [provider] Taking Active   TURMERIC PO [id number] No Take 800 mg by mouth in the morning and at bedtime. 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   Atypical chest pain 06/29/2021   Intractable abdominal pain 05/16/2021   Acute pyelonephritis 05/16/2021   Acute cystitis without hematuria 05/16/2021   Hydronephrosis of right kidney 05/16/2021  Obstructive uropathy 05/16/2021    Hypothyroidism 05/16/2021   Hyponatremia 05/16/2021   Obesity (BMI 30-39.9) 05/16/2021   Goals of care, counseling/discussion 03/01/2021   SIRS (systemic inflammatory response syndrome) ([place]) 01/13/2021   Pyelonephritis 01/13/2021   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   Genetic testing 01/27/2020   Family history of breast cancer    Family history of lung cancer    Family history of ovarian cancer    Family history of non-Hodgkin's lymphoma    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   Port catheter in place 11/21/2016   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   Anal cancer ([place]) 11/03/2016   Cystitis 05/06/2015   Degenerative cervical disc 04/08/2015   Trigger point of neck 03/26/2015   Pain in joint, ankle and foot 04/23/2013   Visit for preventive health examination 02/13/2013   Routine gynecological examination 02/13/2013   Family history of breast cancer in first degree relative 02/13/2013   Rash and nonspecific skin eruption 02/13/2013   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   VITAMIN D DEFICIENCY 06/25/2008   NECK PAIN 06/02/2008   FOOT PAIN 06/02/2008   Osteopenia 06/02/2008    Immunization History  Administered Date(s) Administered   Fluad Quad(high Dose 65+) 05/23/2019, 06/18/2020, 07/22/2021   Influenza Whole 06/25/2008, 08/10/2010   Influenza, High Dose Seasonal PF 06/11/2015, 10/27/2016, 06/21/2017   Influenza,inj,Quad PF,6+ Mos 06/28/2018   PFIZER(Purple Top)SARS-COV-2 Vaccination 11/17/2019, 12/10/2019, 07/02/2020   Pneumococcal Conjugate-13 03/11/2015   Pneumococcal Polysaccharide-23 04/29/2016   Tdap 02/13/2013   Zoster, Live 02/13/2013   Patient reports her constipation is ongoing and she feels like everything is affecting this. She is also having horrible arthritis and has not been getting much relief from PT so far. Patient feels totally drained during the day and is   sleeping like a rock but does get up during the night with some  anxiety.  -avoid eating too many [place] nuts that might cause an interaction with the nut's selenium content. Selenium may worsen the effect of: Anticoagulants. Sedatives.  -taking 10000 units of vitamin D? Last vitamin D Lab Results  Component Value Date   VD25OH 48 08/10/2010   -kidney stone history? Avoid turmeric  Conditions to be addressed/monitored:  Hypothyroidism, Osteoporosis, Overactive Bladder, and Pain  Conditions addressed this visit: Hypothyroidism, osteoporosis  There are no care plans that you recently modified to display for this patient.     Medication Assistance: None required.  Patient affirms current coverage meets needs.  Compliance/Adherence/Medication fill history: Care Gaps: Shingrix, COVID booster, colonoscopy, influenza vaccine  Star-Rating Drugs: None  Patient's preferred pharmacy is:  [id number] - [place], [place] - [address] [place] [address] Phone: [phone] Fax: [phone]  [facility] - [address] [place] [address] Phone: [phone] Fax: [phone]  [place] [facility] [place] [address] Phone: [phone] Fax: [phone]   Uses pill box? No - has own system Pt endorses 100% compliance  We discussed: Current pharmacy is preferred with insurance plan and patient is satisfied with pharmacy services Patient decided to: Continue current medication management strategy  Care Plan and Follow Up Patient Decision:  Patient agrees to Care Plan and Follow-up.  Plan: The care management team will reach out to the patient again over the next 30 days.  [name], PharmD, [place] Pharmacist [place] [facility] [place]

## 2022-06-06 NOTE — Progress Notes (Signed)
Chronic Care Management Pharmacy Note  06/10/2022 Name:  Ashley Moore MRN:  [id number] DOB:  27-Sep-1950  Summary: Pt had a lot of questions about her supplements Pt inquired about [place] nuts  Recommendations/Changes made from today's visit: -Recommended limiting dose of vitamin C to 500 mg per day and limiting use of turmeric given kidney stone history -Recommended not exceeding 1000 mcg/day of selenium due to possible toxicity  Plan: General assessment in 4 months    Subjective: Ashley Moore is an 71 y.o. year old femalemale who is a primary patient of Panosh, Neta Mends, MD.  The CCM team was consulted for assistance with disease management and care coordination needs.    Engaged with patient by telephone for follow up visit in response to provider referral for pharmacy case management and/or care coordination services.   Consent to Services:  The patient was given information about Chronic Care Management services, agreed to services, and gave verbal consent prior to initiation of services.  Please see initial visit note for detailed documentation.   Patient Care Team: Panosh, Neta Mends, MD as PCP - General Louis Meckel, MD (Inactive) as Attending Physician (Gastroenterology) Ladene Artist, MD as Consulting Physician (Oncology) Dorothy Puffer, MD as Consulting Physician (Radiation Oncology) Drema Dallas, DO as Consulting Physician (Neurology) Lang Snow, NP as Nurse Practitioner (Nurse Practitioner) Verline Lema, MD as Referring Physician (Obstetrics and Gynecology) Verner Chol, [facility] as Pharmacist (Pharmacist)  Recent office visits: 01/20/22 Nelwyn Salisbury, MD - Patient presented for UTI with hematuria and other concerns. Prescribed Ciprofloxacin.  06/08/21 Berniece Andreas, MD: Patient presented for hospitalization follow up. Repeat TSH for end of October.  Recent consult visits: 05/30/22 Narda Amber, PT [facility] PT): Patient presented  for PT treatment for right shoulder.  05/19/22 Thornton Papas, MD (oncology): Patient presented for anal cancer follow up and Opdivo infusion.  04/21/22 Patient presented to [facility] at [facility] for Oncology Treatment.   04/21/22 Rana Snare, NP (Oncology) - Patient presented for Anal cancer. No medication changes.   04/18/22 Sharmon Leyden, PT (PT) - Patient presented for Stiffness of right shoulder and other concerns. No medication changes.   04/12/22 Sharmon Leyden, PT (PT) - Patient presented for Stiffness of right shoulder and other concerns. No medication changes.   03/29/22 Nahser, Deloris Ping, MD (Cardiology) - Patient presented for Coronary artery calcification. No medication changes.   03/28/22 Sharmon Leyden, PT (PT) - Patient presented for Stiffness of right shoulder and other concerns. No medication changes.   03/24/22 Patient presented to [facility] at [facility] for Oncology Treatment.   03/24/22 Ladene Artist, MD (Oncology) - Patient presented for Anal Cancer. Stopped Polyethylene Glycol.   03/16/22 Patient presented to [facility] for CT Chest ABD/Pelvis w Contrast.   03/14/22 Sharmon Leyden, PT (PT) - Patient presented for Stiffness of right shoulder and other concerns. No medication changes.   03/03/22 Jannifer Hick (Urology) - Claims encounter for Hydronephrosis with uteropelvic junction obstruction and other concerns. No other visit details.   03/01/22 Barbaraann Rondo, PT - Patient presented for Stiffness of right shoulder and other concerns. No medication changes.   02/23/22 Patient presented to [facility] at [facility] for Oncology Treatment.   02/23/22 Rana Snare, NP - Patient presented for Anal cancer and other concerns. Stopped Cipro   01/26/22 Patient presented to [facility] for Infusion.  01/26/22 [name], MD (Oncology) - Patient presented for  Anal Cancer. No medication changes.   01/12/22 Patient presented to [facility] at [facility] for Infusion.   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 No medication changes.   12/30/21 [name], MD (Oncology) - Patient presented for Anal cancer. No medication changes.   12/29/21 Delanna Notice, MD (GYN) - Patient presented for Anal squamous cell carcinoma. Prescribed Macrobid.  Hospital visits: Medication Reconciliation was completed by comparing discharge summary, patient's EMR and Pharmacy list, and upon discussion with patient.   Patient presented to [facility] on 04/20/22 due to Sprain of left ankle. Patient was present for 1 hour.   New?Medications Started at [facility] Discharge:?? -started  none   Medication Changes at Hospital Discharge: -Changed  none   Medications Discontinued at Hospital Discharge: -Stopped  none   Medications that remain the same after Hospital Discharge:??  -All other medications will remain the same.   Objective:  Lab Results  Component Value Date   CREATININE 0.95 05/19/2022   BUN 25 (H) 05/19/2022   GFR 54.10 (L) 01/27/2021   GFRNONAA >60 05/19/2022   GFRAA >60 06/17/2020   NA 132 (L) 05/19/2022   K 4.3 05/19/2022   CALCIUM 10.2 05/19/2022   CO2 25 05/19/2022   GLUCOSE 80 05/19/2022    Lab Results  Component Value Date/Time   HGBA1C 5.5 05/11/2015 11:18 AM   GFR 54.10 (L) 01/27/2021 02:54 PM   GFR 81.29 01/08/2020 10:08 AM    Last diabetic Eye exam: No results found for: "HMDIABEYEEXA"  Last diabetic Foot exam: No results found for: "HMDIABFOOTEX"   Lab Results  Component Value Date   CHOL 156 03/29/2022   HDL 94 03/29/2022   LDLCALC 50 03/29/2022   TRIG 56 03/29/2022   CHOLHDL 1.7 03/29/2022       Latest Ref Rng & Units 05/19/2022   11:12 AM 04/21/2022   11:31 AM 03/24/2022    9:25 AM  Hepatic Function  Total Protein 6.5 - 8.1  g/dL 7.5  6.9  7.7   Albumin 3.5 - 5.0 g/dL 4.2  4.0  4.2   AST 15 - 41 U/L $Remo'22  24  23   'ARjEL$ ALT 0 - 44 U/L $Remo'11  10  11   'HRCof$ Alk Phosphatase 38 - 126 U/L 58  55  62   Total Bilirubin 0.3 - 1.2 mg/dL 0.3  0.3  0.4     Lab Results  Component Value Date/Time   TSH 2.780 04/21/2022 11:30 AM   TSH 7.672 (H) 03/24/2022 09:25 AM   TSH 7.850 (H) 04/21/2021 08:52 AM   TSH 2.84 04/17/2020 01:28 PM   FREET4 0.95 06/23/2021 08:54 AM   FREET4 0.94 04/21/2021 08:52 AM       Latest Ref Rng & Units 05/19/2022   11:12 AM 04/21/2022   11:31 AM 03/24/2022    9:25 AM  CBC  WBC 4.0 - 10.5 K/uL 5.5  3.1  4.1   Hemoglobin 12.0 - 15.0 g/dL 12.1  11.2  11.5   Hematocrit 36.0 - 46.0 % 37.9  34.5  37.0   Platelets 150 - 400 K/uL 222  135  207     Lab Results  Component Value Date/Time   VD25OH 48 08/10/2010 09:31 PM   VD25OH 46 11/11/2008 10:55 PM    Clinical ASCVD: No  The 10-year ASCVD risk score (Arnett DK, et  al., 2019) is: 9.8%   Values used to calculate the score:     Age: 49 years     Sex: Female     Is Non-Hispanic African American: No     Diabetic: No     Tobacco smoker: No     Systolic Blood Pressure: 884 mmHg     Is BP treated: No     HDL Cholesterol: 94 mg/dL     Total Cholesterol: 156 mg/dL       01/20/2022   11:49 AM 11/18/2021    2:56 PM 10/25/2021    2:41 PM  Depression screen PHQ 2/9  Decreased Interest 0 0 1  Down, Depressed, Hopeless 0 0 0  PHQ - 2 Score 0 0 1  Altered sleeping 0 0 0  Tired, decreased energy 2 0 2  Change in appetite 0 0 0  Feeling bad or failure about yourself  0 0 0  Trouble concentrating 0 0 0  Moving slowly or fidgety/restless 0 0 0  Suicidal thoughts 0 0 0  PHQ-9 Score 2 0 3  Difficult doing work/chores Not difficult at all       Social History   Tobacco Use  Smoking Status Former   Packs/day: 1.00   Years: 25.00   Total pack years: 25.00   Types: Cigarettes   Quit date: 06/20/1995   Years since quitting: 26.9  Smokeless Tobacco Never   BP  Readings from Last 3 Encounters:  05/19/22 123/69  05/19/22 128/73  04/21/22 139/84   Pulse Readings from Last 3 Encounters:  05/19/22 69  05/19/22 82  04/21/22 68   Wt Readings from Last 3 Encounters:  05/19/22 164 lb (74.4 kg)  03/29/22 163 lb 6.4 oz (74.1 kg)  03/24/22 161 lb 3.2 oz (73.1 kg)   BMI Readings from Last 3 Encounters:  05/19/22 30.00 kg/m  04/21/22 29.89 kg/m  03/29/22 29.89 kg/m    Assessment/Interventions: Review of patient past medical history, allergies, medications, health status, including review of consultants reports, laboratory and other test data, was performed as part of comprehensive evaluation and provision of chronic care management services.   SDOH:  (Social Determinants of Health) assessments and interventions performed: Yes SDOH Interventions    Flowsheet Row Chronic Care Management from 06/07/2022 in [place] at [place] from 11/18/2021 in [place] at [place] from 12/10/2020 in [place] at [place] Management from 09/02/2020 in [place] at [place] Interventions -- Intervention Not Indicated Intervention Not Indicated --  Housing Interventions -- Intervention Not Indicated Intervention Not Indicated --  Transportation Interventions -- Intervention Not Indicated Intervention Not Indicated Intervention Not Indicated  Financial Strain Interventions Intervention Not Indicated Intervention Not Indicated Intervention Not Indicated Intervention Not Indicated  Physical Activity Interventions -- Intervention Not Indicated Intervention Not Indicated --  Stress Interventions -- Intervention Not Indicated -- --  Social Connections Interventions -- Intervention Not Indicated Intervention Not Indicated --      SDOH Screenings   Food Insecurity: No Food Insecurity (11/18/2021)  Housing: [place]  (11/18/2021)  Transportation  Needs: No Transportation Needs (11/18/2021)  Alcohol Screen: Low Risk  (11/18/2021)  Depression (PHQ2-9): Low Risk  (01/20/2022)  Financial Resource Strain: Low Risk  (06/10/2022)  Physical Activity: Inactive (11/18/2021)  Social Connections: Moderately Integrated (11/18/2021)  Stress: No Stress Concern Present (11/18/2021)  Tobacco Use: Medium Risk (05/30/2022)    CCM Care Plan  Allergies  Allergen Reactions   Sulfamethoxazole-Trimethoprim Other (See Comments)    C/o  abd pain and constipation   Benadryl [Diphenhydramine] Other (See Comments)    Tingle all over    Melatonin     Tingle all over    Penicillins Rash    Did it involve swelling of the face/tongue/throat, SOB, or low BP? N Did it involve sudden or severe rash/hives, skin peeling, or any reaction on the inside of your mouth or nose? Y Did you need to seek medical attention at a hospital or doctor's office? N When did it last happen? Almost 50 years Ago      If all above answers are "NO", may proceed with cephalosporin use.     Medications Reviewed Today     Reviewed by Verner Chol, [facility] (Pharmacist) on 06/07/22 at 1556  Med List Status: <None>   Medication Order Taking? Sig Documenting Provider Last Dose Status Informant  acetaminophen (TYLENOL) 500 MG tablet [id number] Yes Take 1,000 mg by mouth 2 (two) times daily. [provider] Taking Active Self  Ascorbic Acid (VITAMIN C) 1000 MG tablet [id number] Yes Take 1,000 mg by mouth daily. [provider] Taking Active   azelastine (ASTELIN) 0.1 % nasal spray [id number] No Place 2 sprays into both nostrils 2 (two) times daily. Use in each nostril as directed  Patient not taking: Reported on 06/07/2022   Madelin Headings, MD Not Taking Active   Calcium Carb-Cholecalciferol (CALCIUM 600/VITAMIN D) 600-10 MG-MCG CHEW [id number] Yes Chew 1 tablet by mouth daily. [provider] Taking Active   conjugated estrogens (PREMARIN) vaginal cream [id number] Yes Place 1  Applicatorful vaginally daily. [provider] Taking Active Self  Cranberry 500 MG TABS [id number] Yes Take 1 tablet by mouth at bedtime. [provider] Taking Active   levothyroxine (SYNTHROID) 75 MCG tablet [id number] Yes TAKE 1 TABLET EVERY DAY Panosh, Neta Mends, MD Taking Active   loratadine (CLARITIN) 10 MG tablet [id number] Yes Take 10 mg by mouth daily. [provider] Taking Active   LORazepam (ATIVAN) 0.5 MG tablet [id number] Yes Take 1 tablet (0.5 mg total) by mouth every 8 (eight) hours as needed for anxiety (as needed). Rana Snare, NP Taking Active   MYRBETRIQ 50 MG TB24 tablet [id number] Yes Take 50 mg by mouth daily. [provider] Taking Active Self  OVER THE COUNTER MEDICATION [id number]  Place 1 drop into both eyes daily as needed (dry eyes). [provider]  Active Self  OVER THE COUNTER MEDICATION [id number]  Gundry MD Total Restore  Patient not taking: Reported on 05/19/2022   [provider]  Active   OVER THE COUNTER MEDICATION [id number]  Take by mouth 2 (two) times daily. GI advantage promotes smooth comfortable digestion with a unique blend of gluten digestive enzymes probiotic and fermented fruit polyphenal  Patient not taking: Reported on 05/19/2022   [provider]  Active   OVER THE COUNTER MEDICATION [id number]  Take 1 Scoop by mouth daily. Essential Orange [provider]  Active   Prenatal Vit-Fe Fumarate-FA (PRENATAL PO) [id number] Yes Take by mouth daily. [provider] Taking Active   protein supplement shake (PREMIER PROTEIN) LIQD [id number]  Take 11 oz by mouth daily. [provider]  Active   TURMERIC PO [id number] Yes Take 2,250 mg by mouth daily. 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   Atypical chest pain 06/29/2021   Intractable abdominal pain  05/16/2021   Acute pyelonephritis 05/16/2021   Acute cystitis without hematuria 05/16/2021   Hydronephrosis of right kidney 05/16/2021   Obstructive uropathy 05/16/2021   Hypothyroidism 05/16/2021   Hyponatremia 05/16/2021   Obesity (BMI 30-39.9) 05/16/2021   Goals of care, counseling/discussion 03/01/2021   SIRS (systemic inflammatory response syndrome) ([place]) 01/13/2021   Pyelonephritis 01/13/2021   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   Genetic testing 01/27/2020   Family history of breast cancer    Family history of lung cancer    Family history of ovarian cancer    Family history of non-Hodgkin's lymphoma    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   Port catheter in place 11/21/2016   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   Anal cancer ([place]) 11/03/2016   Cystitis 05/06/2015   Degenerative cervical disc 04/08/2015   Trigger point of neck 03/26/2015   Pain in joint, ankle and foot 04/23/2013   Visit for preventive health examination 02/13/2013   Routine gynecological examination 02/13/2013   Family history of breast cancer in first degree relative 02/13/2013   Rash and nonspecific skin eruption 02/13/2013   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   VITAMIN D DEFICIENCY 06/25/2008   NECK PAIN 06/02/2008   FOOT PAIN 06/02/2008   Osteopenia 06/02/2008    Immunization History  Administered Date(s) Administered   Fluad Quad(high Dose 65+) 05/23/2019, 06/18/2020, 07/22/2021   Influenza Whole 06/25/2008, 08/10/2010   Influenza, High Dose Seasonal PF 06/11/2015, 10/27/2016, 06/21/2017   Influenza,inj,Quad PF,6+ Mos 06/28/2018   PFIZER(Purple Top)SARS-COV-2 Vaccination 11/17/2019, 12/10/2019, 07/02/2020   Pneumococcal Conjugate-13 03/11/2015   Pneumococcal Polysaccharide-23 04/29/2016   Tdap 02/13/2013   Zoster, Live 02/13/2013   Patient reports she has a lot of questions about the supplements she is taking. Her oncologist told her to watch her liver.  She is taking Dr. Ardeth Perfect supplements and the only one she is currently taking is essential orange. She is not taking GI advantage or total restore right now because she was worried that they would affect her liver.  Patient does in fact confirm that she has had kidney stones twice in her life.  Patient usually eats about 4 [place] nuts/day and these can have up to 90 mcg of selenium in them. Discussed signs/symptoms of selenium toxicity.  Conditions to be addressed/monitored:  Hypothyroidism, Osteoporosis, Overactive Bladder, and Pain  Conditions addressed this visit: Hypothyroidism, osteoporosis  Care Plan : [place]  Updates made by Viona Gilmore, [place] since 06/10/2022 12:00 AM     Problem: Problem: Hypothyroidism, Osteoporosis, Overactive Bladder, and Pain      Long-Range Goal: Patient-Specific Goal   Start Date: 03/03/2021  Expected End Date: 03/03/2022  Recent Progress: On track  Priority: High  Note:   Current Barriers:  Unable to independently monitor therapeutic efficacy  Pharmacist Clinical Goal(s):  Patient will achieve adherence to monitoring guidelines and medication adherence to achieve therapeutic efficacy through collaboration with PharmD and provider.   Interventions: 1:1 collaboration with Panosh, Standley Brooking, MD regarding development and update of comprehensive plan of care as evidenced by provider attestation and co-signature Inter-disciplinary care team collaboration (see longitudinal plan of care) Comprehensive medication review performed; medication list updated in electronic medical record  Osteoporosis (Goal prevent fractures) -Not ideally controlled -Last DEXA Scan: 07/2018               T-Score femoral neck: RFN -2.7, LFN -  2.4             T-Score total hip: n/a             T-Score lumbar spine: 1.3             T-Score forearm radius: n/a             10-year probability of major osteoporotic fracture: n/a             10-year probability of  hip fracture: n/a -Patient is a candidate for pharmacologic treatment due to T-Score < -2.5 in femoral neck -Current treatment  Calcium-vitamin D 600-800 mg-unit 1 tablet daily - Appropriate, Effective, Safe, Accessible Prenatal vitamin daily (1000 units of vitamin D) - Appropriate, Effective, Safe, Accessible -Medications previously tried: none  -Recommend [id number] units of vitamin D daily. Recommend 1200 mg of calcium daily from dietary and supplemental sources. Recommend weight-bearing and muscle strengthening exercises for building and maintaining bone density. - Patient does not consume much calcium in her diet as she drinks almond milk, occasionally yogurt or cheese and no nuts or seeds  Hypothyroidism (Goal: TSH 2.5-4.5 given osteoporosis) -Controlled -Current treatment  Levothyroxine 75 mcg 1 tablet daily - Appropriate, Effective, Safe, Accessible -Medications previously tried: none -Recommended to continue current medication  Anxiety (Goal: minimize symptoms) -Not ideally controlled -Current treatment  Lorazepem 0.5 mg as needed - Appropriate, Effective, Safe, Accessible -Medications previously tried: none  -Counseled on limiting use due to sedation Patient is using around the clock  Overactive bladder (Goal: minimize symptoms) -Uncontrolled -Current treatment  Myrbetriq 50 mg 1 tablet daily - Appropriate, Query effective, Safe, Accessible -Medications previously tried: none  -Counseled on considering alternative Gem[place]a.  Pain (Goal: minimize pain) -Uncontrolled -Current treatment  Tylenol 500 mg 2 tablets daily - Appropriate, Effective, Safe, Accessible -Medications previously tried: none  -Counseled on Using tramadol sparingly; maximum recommended dose of Tylenol 3,000 mg/day Recommended  diclofenac gel with lower risk of bleeding and pain control or capsaicin cream for joint pain  Health Maintenance -Vaccine gaps: shingles -Current therapy:  Turmeric 2250 mg  (500 mg of curcumin) 1 capsule daily Premarin cream insert 1 applicatorful daily Prenatal vitamin daily Essential orange supplement daily Cinnamon daily  -Educated on Cost vs benefit of each product must be carefully weighed by individual consumer -Patient is satisfied with current therapy and denies issues -Recommended to continue current medication  Patient Goals/Self-Care Activities Patient will:  - take medications as prescribed target a minimum of 150 minutes of moderate intensity exercise weekly  Follow Up Plan: The care management team will reach out to the patient again over the next 7 days.        Medication Assistance: None required.  Patient affirms current coverage meets needs.  Compliance/Adherence/Medication fill history: Care Gaps: Shingrix, COVID booster, colonoscopy, influenza vaccine  Star-Rating Drugs: None  Patient's preferred pharmacy is:  [id number] - [place], [place] - [address] [place] [address] Phone: [phone] Fax: [phone]  [facility] - [address] [place] [address] Phone: [phone] Fax: [place] [address] Phone: [phone] Fax: [phone]   Uses pill box? No - has own system Pt endorses 100% compliance  We discussed: Current pharmacy is preferred with insurance plan and patient is satisfied with pharmacy services Patient decided to: Continue current medication management  strategy  Care Plan and Follow Up Patient Decision:  Patient agrees to Care Plan and Follow-up.  Plan: The care management team will reach out to the patient again over the next 7 days.  [name], PharmD, [place] Pharmacist [place] at [place]

## 2022-06-07 ENCOUNTER — Ambulatory Visit (INDEPENDENT_AMBULATORY_CARE_PROVIDER_SITE_OTHER): Payer: Medicare HMO | Admitting: Pharmacist

## 2022-06-07 DIAGNOSIS — E039 Hypothyroidism, unspecified: Secondary | ICD-10-CM

## 2022-06-07 DIAGNOSIS — M810 Age-related osteoporosis without current pathological fracture: Secondary | ICD-10-CM

## 2022-06-10 ENCOUNTER — Other Ambulatory Visit: Payer: Self-pay | Admitting: Internal Medicine

## 2022-06-10 DIAGNOSIS — Z1231 Encounter for screening mammogram for malignant neoplasm of breast: Secondary | ICD-10-CM

## 2022-06-10 NOTE — Patient Instructions (Signed)
Hi Ashley Moore,  It was great to catch up with you again! I will let you know what I can find out about your supplements.  Please reach out to me if you have any questions or need anything!  Best, Maddie  [name], PharmD, [place] Pharmacist [place] at [place]

## 2022-06-13 ENCOUNTER — Telehealth: Payer: Self-pay | Admitting: Pharmacist

## 2022-06-13 NOTE — Telephone Encounter (Signed)
Called patient back with answers about supplement questions. Recommended decreasing calcium to no more than 500 mg/day and Turmeric dose given history of kidney stones.  Adjusted medication list to reflect change in doses.  Made patient aware that essential orange should be safe to take. Patient may want to limit total restore given wormwood ingredient that has been linked to hepatotoxicity. Unable to find GI advantage and patient read that it contained fermented fruits and she is aware that she should not be taking anything fermented. She will discontinue both of these and removed from medication list.

## 2022-06-16 ENCOUNTER — Encounter: Payer: Self-pay | Admitting: Nurse Practitioner

## 2022-06-16 ENCOUNTER — Other Ambulatory Visit (HOSPITAL_BASED_OUTPATIENT_CLINIC_OR_DEPARTMENT_OTHER): Payer: Self-pay

## 2022-06-16 ENCOUNTER — Encounter: Payer: Self-pay | Admitting: Oncology

## 2022-06-16 ENCOUNTER — Inpatient Hospital Stay: Payer: Medicare HMO | Admitting: Nurse Practitioner

## 2022-06-16 ENCOUNTER — Inpatient Hospital Stay: Payer: Medicare HMO

## 2022-06-16 ENCOUNTER — Inpatient Hospital Stay: Payer: Medicare HMO | Attending: Nurse Practitioner

## 2022-06-16 VITALS — BP 128/70 | HR 60 | Temp 98.1°F | Resp 18 | Ht 62.0 in | Wt 161.2 lb

## 2022-06-16 VITALS — BP 127/67 | HR 68 | Resp 18

## 2022-06-16 DIAGNOSIS — Z5112 Encounter for antineoplastic immunotherapy: Secondary | ICD-10-CM | POA: Diagnosis not present

## 2022-06-16 DIAGNOSIS — Z79899 Other long term (current) drug therapy: Secondary | ICD-10-CM | POA: Insufficient documentation

## 2022-06-16 DIAGNOSIS — C772 Secondary and unspecified malignant neoplasm of intra-abdominal lymph nodes: Secondary | ICD-10-CM

## 2022-06-16 DIAGNOSIS — C210 Malignant neoplasm of anus, unspecified: Secondary | ICD-10-CM | POA: Insufficient documentation

## 2022-06-16 LAB — CBC WITH DIFFERENTIAL (CANCER CENTER ONLY)
Abs Immature Granulocytes: 0.02 10*3/uL (ref 0.00–0.07)
Basophils Absolute: 0 10*3/uL (ref 0.0–0.1)
Basophils Relative: 1 %
Eosinophils Absolute: 0.2 10*3/uL (ref 0.0–0.5)
Eosinophils Relative: 3 %
HCT: 37.7 % (ref 36.0–46.0)
Hemoglobin: 12 g/dL (ref 12.0–15.0)
Immature Granulocytes: 0 %
Lymphocytes Relative: 13 %
Lymphs Abs: 0.6 10*3/uL — ABNORMAL LOW (ref 0.7–4.0)
MCH: 28.8 pg (ref 26.0–34.0)
MCHC: 31.8 g/dL (ref 30.0–36.0)
MCV: 90.6 fL (ref 80.0–100.0)
Monocytes Absolute: 0.3 10*3/uL (ref 0.1–1.0)
Monocytes Relative: 6 %
Neutro Abs: 3.8 10*3/uL (ref 1.7–7.7)
Neutrophils Relative %: 77 %
Platelet Count: 207 10*3/uL (ref 150–400)
RBC: 4.16 MIL/uL (ref 3.87–5.11)
RDW: 14.3 % (ref 11.5–15.5)
WBC Count: 4.9 10*3/uL (ref 4.0–10.5)
nRBC: 0 % (ref 0.0–0.2)

## 2022-06-16 LAB — CMP (CANCER CENTER ONLY)
ALT: 10 U/L (ref 0–44)
AST: 22 U/L (ref 15–41)
Albumin: 4.2 g/dL (ref 3.5–5.0)
Alkaline Phosphatase: 61 U/L (ref 38–126)
Anion gap: 9 (ref 5–15)
BUN: 22 mg/dL (ref 8–23)
CO2: 23 mmol/L (ref 22–32)
Calcium: 9.5 mg/dL (ref 8.9–10.3)
Chloride: 100 mmol/L (ref 98–111)
Creatinine: 0.89 mg/dL (ref 0.44–1.00)
GFR, Estimated: >60 mL/min (ref >60)
Glucose, Bld: 74 mg/dL (ref 70–99)
Potassium: 4.3 mmol/L (ref 3.5–5.1)
Sodium: 132 mmol/L — ABNORMAL LOW (ref 135–145)
Total Bilirubin: 0.3 mg/dL (ref 0.3–1.2)
Total Protein: 7.4 g/dL (ref 6.5–8.1)

## 2022-06-16 LAB — TSH: TSH: 3.16 u[IU]/mL (ref 0.350–4.500)

## 2022-06-16 MED ORDER — INFLUENZA VAC A&B SA ADJ QUAD 0.5 ML IM PRSY
PREFILLED_SYRINGE | INTRAMUSCULAR | 0 refills | Status: DC
  Filled 2022-06-16: qty 0.5, 1d supply, fill #0

## 2022-06-16 MED ORDER — SODIUM CHLORIDE 0.9 % IV SOLN: Freq: Once | INTRAVENOUS | Status: AC

## 2022-06-16 MED ORDER — SODIUM CHLORIDE 0.9 % IV SOLN
480.0000 mg | Freq: Once | INTRAVENOUS | Status: AC
  Administered 2022-06-16: 480 mg via INTRAVENOUS
  Filled 2022-06-16: qty 48

## 2022-06-16 NOTE — Progress Notes (Signed)
VS and labs within treatment parameters.  Per Ned Card, NP ok to proceed with treatment today without any changes to treatment plan.

## 2022-06-16 NOTE — Progress Notes (Signed)
[facility] OFFICE PROGRESS NOTE   Diagnosis: Anal cancer  INTERVAL HISTORY:   Ashley Moore returns as scheduled.  She completed another cycle of nivolumab 05/19/2022.  No rash or diarrhea.  Chronic right shoulder and knee pain which she relates to a fall.  She feels well overall.  Objective:  Vital signs in last 24 hours:  Blood pressure 128/70, pulse 60, temperature 98.1 F (36.7 C), temperature source Oral, resp. rate 18, height '5\' 2"'$  (1.575 m), weight 161 lb 3.2 oz (73.1 kg), SpO2 100 %.    HEENT: No thrush or ulcers. Lymphatics: No palpable cervical or supraclavicular lymph nodes. Resp: Lungs with scattered faint rhonchi bilaterally.  No respiratory distress. Cardio: Regular rate and rhythm. GI: Abdomen soft and nontender.  No hepatosplenomegaly. Vascular: Trace edema lower leg bilaterally. Skin: No rash.   Lab Results:  Lab Results  Component Value Date   WBC 4.9 06/16/2022   HGB 12.0 06/16/2022   HCT 37.7 06/16/2022   MCV 90.6 06/16/2022   PLT 207 06/16/2022   NEUTROABS 3.8 06/16/2022    Imaging:  No results found.  Medications: I have reviewed the patient's current medications.  Assessment/Plan: Anal cancer CT abdomen/pelvis 10/21/2016-thickening of the anus extending to the junction between the anus and rectum with a large stool ball in the rectum and mild fat stranding posterior to the rectum. Enlarged lymph nodes in the right and left inguinal regions. A few mildly prominent nodes seen posterior to the rectum. 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 PET scan 55/73/2202-[id number] hypermetabolic anal mass with hypermetabolic metastases to the groin region bilaterally, left pelvic sidewall and presacral space. Initiation of radiation and cycle 1 5-FU/mitomycin C 11/14/2016 Cycle 2 5-FU/mitomycin C 12/12/2016 (5-FU dose reduced due to mucositis, diarrhea, skin breakdown) Radiation completed 12/23/2016 CT abdomen/pelvis  04/14/2017-resolution of anal mass and bilateral inguinal lymphadenopathy. No residual tumor seen. CT abdomen/pelvis 01/28/2020-right hydroureteronephrosis, transition at the level of the mid right ureter, retroperitoneal lymphadenopathy CT renal stone study 01/28/2020-severe right hydronephrosis and proximal right hydroureter, 8 mm area of increased attenuation in the proximal right ureter-stone versus soft tissue mass PET 02/13/2020-hypermetabolic right retroperitoneal nodal metastases in the aortocaval and posterior pericaval chains, nonspecific mild anal wall hypermetabolism, right nephroureteral stent 02/21/2020 anorectal exam per Dr. Ashley Jacobs radiation changes of the skin around the anal margin.  Posterior midline smooth scarring.  Mildly stenotic.  Stenosis seems better.  No palpable concerning lesions.  Entire anal canal and distal rectum feels smooth and healthy.  Partial anoscopy performed with no lesions in the anal canal. Xeloda/radiation beginning 03/02/2020, completed 04/13/2020 CT abdomen/pelvis 06/17/2020-resolution of right retroperitoneal lymphadenopathy, no remaining pathologically enlarged lymph nodes, residual mild right hydronephrosis, no evidence of progressive disease, stable anal wall thickening Digital rectal exam by Dr. Quentin Cornwall 08/26/2020-posterior midline smooth scarring.  Mildly stenotic.  Stenosis seems better.  No palpable concerning lesions.  Anal canal feels smooth without palpable concern.  Unable to tolerate anoscopy.  Next visit in 6 months for surveillance. CT abdomen/pelvis 10/28/2020-no evidence of local recurrence or metastatic disease.  Stable mild rectal wall thickening.  New diffuse bladder wall thickening possibly related to prior radiation CT abdomen/pelvis 01/16/2021- recurrent right-sided hydronephrosis and proximal right hydroureter status post removal of right ureter stent, new soft tissue nodule along the course of the right ureter between the right psoas and  IVC suspicious for recurrent lymphadenopathy PET 02/12/2021-small focus of hypermetabolism at the anorectal junction without a definable mass, right retroperitoneal nodal metastasis with obstruction  of the proximal right ureter, right middle lobe hypermetabolic nodule, hypermetabolism in the right hilum without a defined nodal mass, hypermetabolic left supraclavicular and left axillary nodes 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 Cycle 1 Taxol/carboplatin 03/11/2021 Cycle 2 Taxol/carboplatin 03/31/2021, Fulphila Cycle 3 Taxol/carboplatin 04/21/2021, Fulphila CTs 05/11/2021-decrease size of FDG avid nodule in right middle lobe, right middle lobe subpleural nodule slightly enlarged, decreased in size of left axillary and subpectoral nodes, unchanged FDG avid right iliac nodes Cycle 4 Taxol/carboplatin 05/12/2021, G-CSF discontinued secondary to poor tolerance, Taxol and carboplatin dose reduced Cycle 5 Taxol/carboplatin 06/02/2021 Cycle 6 carboplatin 07/01/2021, Taxol held due to neuropathy Cycle 7 carboplatin 07/29/2021, Taxol held due to neuropathy CTs 08/16/2021-new liver lesion, increase in right psoas lymph node, unchanged right hydronephrosis, new mediastinal and right hilar lymphadenopathy, enlargement of bilateral pulmonary nodules and at least one new nodule, new subcarinal node Cycle 1 nivolumab 08/25/2021 Cycle 2 nivolumab 09/08/2021 Cycle 3 nivolumab 09/22/2021 Cycle 4 nivolumab 10/06/2021 Cycle 5 nivolumab 10/20/2021 Cycle 6 nivolumab 11/04/2021 CTs 11/12/2021-resolution of lung nodules, decreased size of right psoas node, no remaining mediastinal lymphadenopathy, resolution of right liver lesion Cycle 7 nivolumab 11/17/2021 Cycle 8 nivolumab 12/01/2021 Cycle 9 nivolumab 12/15/2021 Cycle 10 nivolumab 12/30/2021 Cycle 11 nivolumab 01/12/2022 Cycle 12 nivolumab 01/26/2105-monthy dosing Cycle 13 nivolumab 02/23/2022 CT 03/16/2022-stable right psoas  mass, no pulmonary nodules, persistent circumferential anal thickening, unchanged subcentimeter left liver lesion Cycle 14 nivolumab 03/24/2022 Cycle 15 nivolumab 04/21/2022 Cycle 16 nivolumab 05/19/2022 Cycle 17 nivolumab 06/16/2022 Left labial lesions. Question direct extension from the anal cancer versus metastatic disease from anal cancer versus a separate malignant process. 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 Multiple family members with breast cancer. History of hypokalemia-likely secondary to decreased nutritional intake and diarrhea; potassium in normal range 01/16/2017. No longer taking a potassium supplement. Right hydroureteronephrosis on CT 01/28/2020, status post a cystoscopy/pyelogram 01/28/2020 confirming extrinsic compression of the right ureter, status post stent placement Right ureter stent exchange 06/12/2020 Right ureter stent removed 01/13/2021   8.  Admission 01/07/2021 with Klebsiella pneumoniae urosepsis/bacteremia  9.  Hospital admission 05/15/2021 with UTI/possible right pyelonephritis      Disposition: Ms. KKleinsasserappears stable.  There is no clinical evidence of disease progression.  She will continue nivolumab on a monthly schedule.  Plan for restaging CTs for the end of November.  CBC reviewed.  Counts adequate to proceed as above.  Chemistry panel pending.  She will return for follow-up and nivolumab in 4 weeks.  We are available to see her sooner if needed.    LNed CardANP/GNP-BC   06/16/2022  12:17 PM

## 2022-06-16 NOTE — Patient Instructions (Signed)
[facility]   Discharge Instructions: Thank you for choosing [facility] to provide your oncology and hematology care.   If you have a lab appointment with the Cancer Center, please go directly to the Cancer Center and check in at the registration area.   Wear comfortable clothing and clothing appropriate for easy access to any Portacath or PICC line.   We strive to give you quality time with your provider. You may need to reschedule your appointment if you arrive late (15 or more minutes).  Arriving late affects you and other patients whose appointments are after yours.  Also, if you miss three or more appointments without notifying the office, you may be dismissed from the clinic at the provider's discretion.      For prescription refill requests, have your pharmacy contact our office and allow 72 hours for refills to be completed.    Today you received the following chemotherapy and/or immunotherapy agents Nivolumab (OPDIVO).      To help prevent nausea and vomiting after your treatment, we encourage you to take your nausea medication as directed.  BELOW ARE SYMPTOMS THAT SHOULD BE REPORTED IMMEDIATELY: *FEVER GREATER THAN 100.4 F (38 C) OR HIGHER *CHILLS OR SWEATING *NAUSEA AND VOMITING THAT IS NOT CONTROLLED WITH YOUR NAUSEA MEDICATION *UNUSUAL SHORTNESS OF BREATH *UNUSUAL BRUISING OR BLEEDING *URINARY PROBLEMS (pain or burning when urinating, or frequent urination) *BOWEL PROBLEMS (unusual diarrhea, constipation, pain near the anus) TENDERNESS IN MOUTH AND THROAT WITH OR WITHOUT PRESENCE OF ULCERS (sore throat, sores in mouth, or a toothache) UNUSUAL RASH, SWELLING OR PAIN  UNUSUAL VAGINAL DISCHARGE OR ITCHING   Items with * indicate a potential emergency and should be followed up as soon as possible or go to the Emergency Department if any problems should occur.  Please show the CHEMOTHERAPY ALERT CARD or IMMUNOTHERAPY ALERT CARD at  check-in to the Emergency Department and triage nurse.  Should you have questions after your visit or need to cancel or reschedule your appointment, please contact [facility]  Dept: [phone]  and follow the prompts.  Office hours are 8:00 a.m. to 4:30 p.m. Monday - Friday. Please note that voicemails left after 4:00 p.m. may not be returned until the following business day.  We are closed weekends and major holidays. You have access to a nurse at all times for urgent questions. Please call the main number to the clinic Dept: [phone] and follow the prompts.   For any non-urgent questions, you may also contact your provider using MyChart. We now offer e-Visits for anyone 18 and older to request care online for non-urgent symptoms. For details visit [email or web address].   Also download the MyChart app! Go to the app store, search "MyChart", open the app, select , and log in with your MyChart username and password.  Masks are optional in the cancer centers. If you would like for your care team to wear a mask while they are taking care of you, please let them know. You may have one support person who is at least 73 years old accompany you for your appointments.  Nivolumab Injection What is this medication? NIVOLUMAB (nye VOL ue mab) treats some types of cancer. It works by helping your immune system slow or stop the spread of cancer cells. It is a monoclonal antibody. This medicine may be used for other purposes; ask your health care provider or pharmacist if you have questions. COMMON BRAND NAME(S): Opdivo   What should I tell my care team before I take this medication? They need to know if you have any of these conditions: Allogeneic stem cell transplant (uses someone else's stem cells) Autoimmune diseases, such as Crohn disease, ulcerative colitis, lupus History of chest radiation Nervous system problems, such as Guillain-Barre syndrome or  myasthenia gravis Organ transplant An unusual or allergic reaction to nivolumab, other medications, foods, dyes, or preservatives Pregnant or trying to get pregnant Breast-feeding How should I use this medication? This medication is infused into a vein. It is given in a hospital or clinic setting. A special MedGuide will be given to you before each treatment. Be sure to read this information carefully each time. Talk to your care team about the use of this medication in children. While it may be prescribed for children as young as 12 years for selected conditions, precautions do apply. Overdosage: If you think you have taken too much of this medicine contact a poison control center or emergency room at once. NOTE: This medicine is only for you. Do not share this medicine with others. What if I miss a dose? Keep appointments for follow-up doses. It is important not to miss your dose. Call your care team if you are unable to keep an appointment. What may interact with this medication? Interactions have not been studied. This list may not describe all possible interactions. Give your health care provider a list of all the medicines, herbs, non-prescription drugs, or dietary supplements you use. Also tell them if you smoke, drink alcohol, or use illegal drugs. Some items may interact with your medicine. What should I watch for while using this medication? Your condition will be monitored carefully while you are receiving this medication. You may need blood work while taking this medication. This medication may cause serious skin reactions. They can happen weeks to months after starting the medication. Contact your care team right away if you notice fevers or flu-like symptoms with a rash. The rash may be red or purple and then turn into blisters or peeling of the skin. You may also notice a red rash with swelling of the face, lips, or lymph nodes in your neck or under your arms. Tell your care team  right away if you have any change in your eyesight. Talk to your care team if you are pregnant or think you might be pregnant. A negative pregnancy test is required before starting this medication. A reliable form of contraception is recommended while taking this medication and for 5 months after the last dose. Talk to your care team about effective forms of contraception. Do not breast-feed while taking this medication and for 5 months after the last dose. What side effects may I notice from receiving this medication? Side effects that you should report to your care team as soon as possible: Allergic reactions--skin rash, itching, hives, swelling of the face, lips, tongue, or throat Dry cough, shortness of breath or trouble breathing Eye pain, redness, irritation, or discharge with blurry or decreased vision Heart muscle inflammation--unusual weakness or fatigue, shortness of breath, chest pain, fast or irregular heartbeat, dizziness, swelling of the ankles, feet, or hands Hormone gland problems--headache, sensitivity to light, unusual weakness or fatigue, dizziness, fast or irregular heartbeat, increased sensitivity to cold or heat, excessive sweating, constipation, hair loss, increased thirst or amount of urine, tremors or shaking, irritability Infusion reactions--chest pain, shortness of breath or trouble breathing, feeling faint or lightheaded Kidney injury (glomerulonephritis)--decrease in the amount of urine, red or   dark brown urine, foamy or bubbly urine, swelling of the ankles, hands, or feet Liver injury--right upper belly pain, loss of appetite, nausea, light-colored stool, dark yellow or brown urine, yellowing skin or eyes, unusual weakness or fatigue Pain, tingling, or numbness in the hands or feet, muscle weakness, change in vision, confusion or trouble speaking, loss of balance or coordination, trouble walking, seizures Rash, fever, and swollen lymph nodes Redness, blistering, peeling,  or loosening of the skin, including inside the mouth Sudden or severe stomach pain, bloody diarrhea, fever, nausea, vomiting Side effects that usually do not require medical attention (report these to your care team if they continue or are bothersome): Bone, joint, or muscle pain Diarrhea Fatigue Loss of appetite Nausea Skin rash This list may not describe all possible side effects. Call your doctor for medical advice about side effects. You may report side effects to FDA at 1-800-FDA-1088. Where should I keep my medication? This medication is given in a hospital or clinic. It will not be stored at home. NOTE: This sheet is a summary. It may not cover all possible information. If you have questions about this medicine, talk to your doctor, pharmacist, or health care provider.  2023 Elsevier/Gold Standard (2021-08-06 00:00:00) 

## 2022-06-17 ENCOUNTER — Ambulatory Visit (INDEPENDENT_AMBULATORY_CARE_PROVIDER_SITE_OTHER): Payer: Medicare HMO | Admitting: Family Medicine

## 2022-06-17 ENCOUNTER — Encounter: Payer: Self-pay | Admitting: Family Medicine

## 2022-06-17 VITALS — BP 120/74 | HR 64 | Resp 12 | Ht 62.0 in | Wt 161.0 lb

## 2022-06-17 DIAGNOSIS — R310 Gross hematuria: Secondary | ICD-10-CM

## 2022-06-17 DIAGNOSIS — R319 Hematuria, unspecified: Secondary | ICD-10-CM

## 2022-06-17 DIAGNOSIS — N390 Urinary tract infection, site not specified: Secondary | ICD-10-CM | POA: Diagnosis not present

## 2022-06-17 LAB — POCT URINALYSIS DIPSTICK
Bilirubin, UA: NEGATIVE
Blood, UA: POSITIVE
Glucose, UA: NEGATIVE
Ketones, UA: NEGATIVE
Nitrite, UA: NEGATIVE
Protein, UA: POSITIVE — AB
Spec Grav, UA: 1.01 (ref 1.010–1.025)
Urobilinogen, UA: 0.2 E.U./dL
pH, UA: 6 (ref 5.0–8.0)

## 2022-06-17 MED ORDER — CEPHALEXIN 500 MG PO CAPS: 500.0000 mg | ORAL_CAPSULE | Freq: Two times a day (BID) | ORAL | 0 refills | Status: AC

## 2022-06-17 NOTE — Progress Notes (Signed)
ACUTE VISIT Chief Complaint  Patient presents with   Urinary Tract Infection    Blood in urine x a week   HPI: Ms.Alexyia C Ghazarian is a 71 y.o. female with medical hx significant for hypothyroidism,anal an vulvar cancer, obstructive uropathy with right hydronephrosis, and chronic radiation cystitis here today complaining of "at least" 2 weeks of urinary symptoms. + Gross hematuria with most urination.  + Urgency with occasional urine incontinent, unchanged from baseline.  Hematuria This is a new problem. The current episode started 1 to 4 weeks ago. The problem is unchanged. She describes the hematuria as gross hematuria. The hematuria occurs throughout her entire urinary stream. She reports no clotting in her urine stream. The pain is mild. Irritative symptoms include frequency and urgency. Obstructive symptoms do not include incomplete emptying or straining. Associated symptoms include abdominal pain (chronic) and dysuria. Pertinent negatives include no chills, fever, flank pain, genital pain, nausea, urinary retention or vomiting. She is not sexually active. There is no history of kidney stones. Risk factors include radiation exposure.  Negative for unusual abdominal pain,changes in bowel habits, melena,or blood in stool. Past hx if pyelonephritis.  She has not tried OTC medications. Abdominal/pelvic CT 03/16/22 showed atrophic right kidney persistent hydronephrosis. Circumferential wall thickening of the urinary bladder.  She saw urologist about 4 months ago and has an appt with gyn oncologist next week.  Last Ucx in 01/2022 grew > 100,000 CFU of Klebsiella pneumonia.  Review of Systems  Constitutional:  Negative for chills and fever.  Respiratory:  Negative for cough, shortness of breath and wheezing.   Gastrointestinal:  Positive for abdominal pain (chronic). Negative for nausea and vomiting.  Genitourinary:  Positive for dysuria, frequency, hematuria and urgency. Negative for  flank pain and incomplete emptying.  Musculoskeletal:  Positive for arthralgias and back pain (chronic.).  Neurological:  Negative for syncope and weakness.  Rest see pertinent positives and negatives per HPI.  Current Outpatient Medications on File Prior to Visit  Medication Sig Dispense Refill   acetaminophen (TYLENOL) 500 MG tablet Take 1,000 mg by mouth 2 (two) times daily.     Ascorbic Acid (VITAMIN C) 500 MG CAPS Take 500 mg by mouth daily.     azelastine (ASTELIN) 0.1 % nasal spray Place 2 sprays into both nostrils 2 (two) times daily. Use in each nostril as directed 30 mL 1   Calcium Carb-Cholecalciferol 600-10 MG-MCG CAPS Take by mouth.     conjugated estrogens (PREMARIN) vaginal cream Place 1 Applicatorful vaginally daily.     Cranberry 500 MG TABS Take 1 tablet by mouth at bedtime.     levothyroxine (SYNTHROID) 75 MCG tablet TAKE 1 TABLET EVERY DAY 90 tablet 1   LORazepam (ATIVAN) 0.5 MG tablet Take 1 tablet (0.5 mg total) by mouth every 8 (eight) hours as needed for anxiety (as needed). 30 tablet 0   MYRBETRIQ 50 MG TB24 tablet Take 50 mg by mouth daily.     OVER THE COUNTER MEDICATION Place 1 drop into both eyes daily as needed (dry eyes).     OVER THE COUNTER MEDICATION Take 1 Scoop by mouth daily. Essential Orange     Prenatal Vit-Fe Fumarate-FA (PRENATAL PO) Take by mouth daily.     protein supplement shake (PREMIER PROTEIN) LIQD Take 11 oz by mouth daily.     TURMERIC PO Take 750 mg by mouth daily.     No current facility-administered medications on file prior to visit.   Past Medical History:  Diagnosis Date   Arthritis    Bowen's disease    excised 1992   Chronic low back pain    Chronic radiation cystitis    DDD (degenerative disc disease), cervical    Family history of breast cancer    Family history of lung cancer    Family history of non-Hodgkin's lymphoma    Family history of ovarian cancer    Headache    Hx of varicella    Hydronephrosis of right kidney     urologist--- dr Tresa Moore,  malignant,  treated with ureter stent   Hypothyroidism    followed by pcp   Lower urinary tract symptoms (LUTS)    01-12-2021  per pt treated with accupuncture at dr Tresa Moore office   Lymphedema of both lower extremities    PICC (peripherally inserted central catheter) in place 01/11/2021   placed at [facility] in [place], [place] for IV antibiotics   Positive blood culture 01/08/2021   Klebsiella Pneumaniae,  treated with daily IV antibiotic   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[facility]) oncologist--- dr Benay Spice   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,  invasive SCC , completed chemo/ radiation 12-23-2016;  recurrent metastatsis retroperitoneal lymphdenopathy,  completed radiation 04-13-2020 residual right ureteral uropathy obstruction   Retroperitoneal lymphadenopathy    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   Sepsis due to Klebsiella pneumoniae ([place]) 01/07/2021   pt admitted to [facility] in [place],  dx sepsis secondary to acute pyelonephritis with bacteremia , positive blood culture,  discharged 01-11-2021 home daily IV antibiotic   Wears glasses    Allergies  Allergen Reactions   Sulfamethoxazole-Trimethoprim Other (See Comments)    C/o  abd pain and constipation   Benadryl [Diphenhydramine] Other (See Comments)    Tingle all over    Melatonin     Tingle all over    Penicillins Rash    Did it involve swelling of the face/tongue/throat, SOB, or low BP? N Did it involve sudden or severe rash/hives, skin peeling, or any reaction on the inside of your mouth or nose? Y Did you need to seek medical attention at a hospital or doctor's office? N When did it last happen? Almost 50 years Ago      If all above answers are "NO", may proceed with cephalosporin use.    Social History   Socioeconomic History   Marital status: Widowed    Spouse name: Not on file   Number of children: 0   Years of education: 14   Highest education level: Associate degree:  academic program  Occupational History   Occupation: self employed    Comment: full time Chief of Staff  Tobacco Use   Smoking status: Former    Packs/day: 1.00    Years: 25.00    Total pack years: 25.00    Types: Cigarettes    Quit date: 06/20/1995    Years since quitting: 27.0   Smokeless tobacco: Never  Vaping Use   Vaping Use: Never used  Substance and Sexual Activity   Alcohol use: Not Currently    Alcohol/week: 0.0 standard drinks of alcohol    Comment: occasional wine   Drug use: Yes    Types: Marijuana    Comment: 01-12-2021  per pt once a week   Sexual activity: Not on file  Other Topics Concern   Not on file  Social History Narrative   H H  of 1      5 pets.   She  is a former smoker   Retired Programmer, applications; Conservator, museum/gallery   [employer]   Red wine  1 per night.    Tea green tea and earl gray    Moved from [place] to [place] area in 04-Dec[place]1983   1 pregnancy   Husband died spring  2016 cv   Sister died 03/06/2015  Bone cancer    3 remaining sisters            Social Determinants of Health   Financial Resource Strain: Low Risk  (06/10/2022)   Overall Financial Resource Strain (CARDIA)    Difficulty of Paying Living Expenses: Not hard at all  Food Insecurity: No Food Insecurity (11/18/2021)   Hunger Vital Sign    Worried About Running Out of Food in the Last Year: Never true    Ran Out of Food in the Last Year: Never true  Transportation Needs: No Transportation Needs (11/18/2021)   PRAPARE - Hydrologist (Medical): No    Lack of Transportation (Non-Medical): No  Physical Activity: Inactive (11/18/2021)   Exercise Vital Sign    Days of Exercise per Week: 0 days    Minutes of Exercise per Session: 0 min  Stress: No Stress Concern Present (11/18/2021)   [place]    Feeling of Stress : Not at all  Social Connections:  Moderately Integrated (11/18/2021)   Social Connection and Isolation Panel [NHANES]    Frequency of Communication with Friends and Family: More than three times a week    Frequency of Social Gatherings with Friends and Family: More than three times a week    Attends Religious Services: More than 4 times per year    Active Member of [employer] or Organizations: Yes    Attends Archivist Meetings: More than 4 times per year    Marital Status: Widowed   Vitals:   06/17/22 1159  BP: 120/74  Pulse: 64  Resp: 12   Body mass index is 29.45 kg/m.  Physical Exam Vitals and nursing note reviewed.  Constitutional:      General: She is not in acute distress.    Appearance: She is well-developed and well-groomed.  HENT:     Head: Normocephalic and atraumatic.  Eyes:     Conjunctiva/sclera: Conjunctivae normal.  Pulmonary:     Effort: Pulmonary effort is normal. No respiratory distress.     Breath sounds: Normal breath sounds.  Abdominal:     Palpations: Abdomen is soft. There is no mass.     Tenderness: There is no abdominal tenderness. There is no right CVA tenderness or left CVA tenderness.  Skin:    General: Skin is warm.     Findings: No erythema.  Neurological:     Mental Status: She is alert and oriented to person, place, and time.     Comments: Unstable gait, assisted with a cane.  Psychiatric:        Mood and Affect: Mood and affect normal.   ASSESSMENT AND PLAN:  Ms.Colbi was seen today for urinary tract infection.  Diagnoses and all orders for this visit: Orders Placed This Encounter  Procedures   Culture, Urine   POCT urinalysis dipstick   Gross hematuria Urine dipstick positive for blood,protein,and small amount of leukocytes. We discussed possible etiologies, including infectious process, radiation cystitis,and malignancy. She sees urologist and has an appt with her gyn oncologist next week.  Urine sent for culture.  Urinary tract infection with  hematuria, site unspecified Empiric treatment with cephalexin started today, will be tailored according to Ucx results and susceptibility report. Adequate hydration. Clearly instructed about warning signs. F/U if symptoms persist.  -     cephALEXin (KEFLEX) 500 MG capsule; Take 1 capsule (500 mg total) by mouth 2 (two) times daily for 5 days.  I spent a total of 30 minutes in both face to face and non face to face activities for this visit on the date of this encounter. During this time history was obtained and documented, examination was performed, prior labs/imaging reviewed, and assessment/plan discussed.  Return if symptoms worsen or fail to improve.  Azreal Stthomas G. [place]ique, MD  [facility]. [place] office.

## 2022-06-17 NOTE — Patient Instructions (Signed)
A few things to remember from today's visit:   Gross hematuria - Plan: POCT urinalysis dipstick, Culture, Urine, Culture, Urine  Urinary tract infection with hematuria, site unspecified - Plan: cephALEXin (KEFLEX) 500 MG capsule  Adequate fluid intake, avoid holding urine for long hours, and over the counter Vit C OR cranberry capsules might help.  Today we will treat empirically with antibiotic, which we might need to change when urine culture comes back depending of bacteria susceptibility.  Seek immediate medical attention if severe abdominal pain, vomiting, fever/chills, or worsening symptoms. F/U if symptomatic are not any better after 2-3 days of antibiotic treatment.  Please be sure medication list is accurate. If a new problem present, please set up appointment sooner than planned today.

## 2022-06-18 DIAGNOSIS — E039 Hypothyroidism, unspecified: Secondary | ICD-10-CM | POA: Diagnosis not present

## 2022-06-18 DIAGNOSIS — M810 Age-related osteoporosis without current pathological fracture: Secondary | ICD-10-CM | POA: Diagnosis not present

## 2022-06-18 LAB — T4: T4, Total: 9 ug/dL (ref 4.5–12.0)

## 2022-06-19 LAB — URINE CULTURE
MICRO NUMBER:: 13987386
SPECIMEN QUALITY:: ADEQUATE

## 2022-06-26 IMAGING — MG MM DIGITAL SCREENING BILAT W/ TOMO AND CAD
8 series · 9 of 24 positions shown · non-contrast
Comparison: Previous exam(s).

CLINICAL DATA: Screening.

EXAM:
DIGITAL SCREENING BILATERAL MAMMOGRAM WITH TOMOSYNTHESIS AND CAD
TECHNIQUE: Bilateral screening digital craniocaudal and mediolateral oblique
mammograms were obtained. Bilateral screening digital breast
tomosynthesis was performed. The images were evaluated with
computer-aided detection.

[L CC synth-2D]
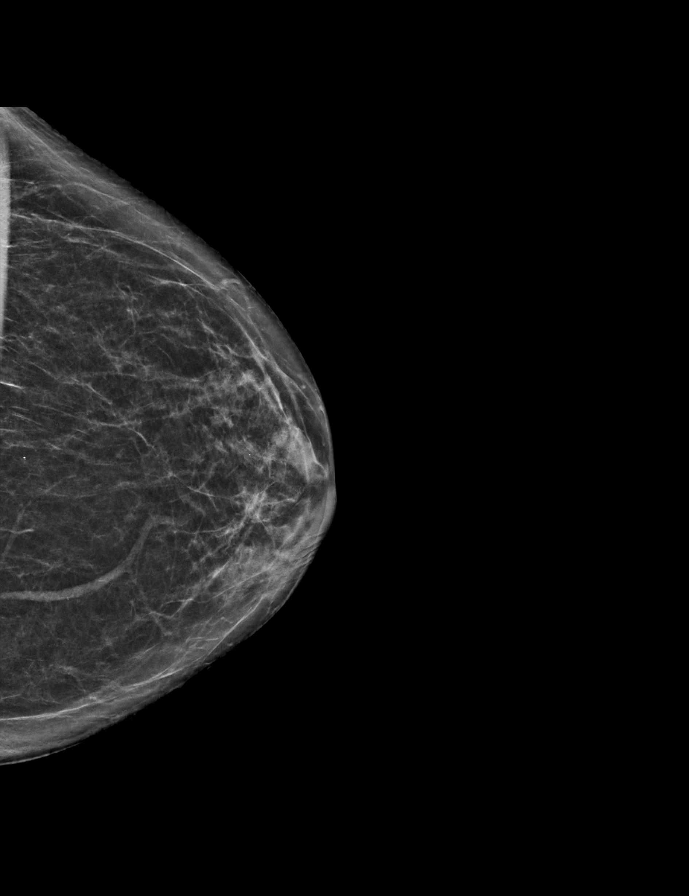

[L MLO synth-2D]
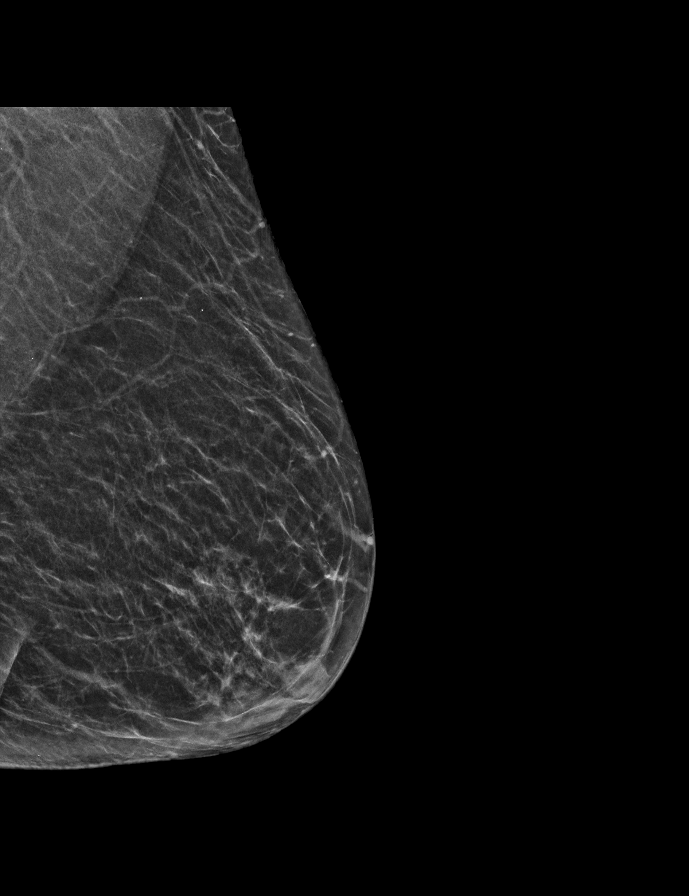

[R CC synth-2D]
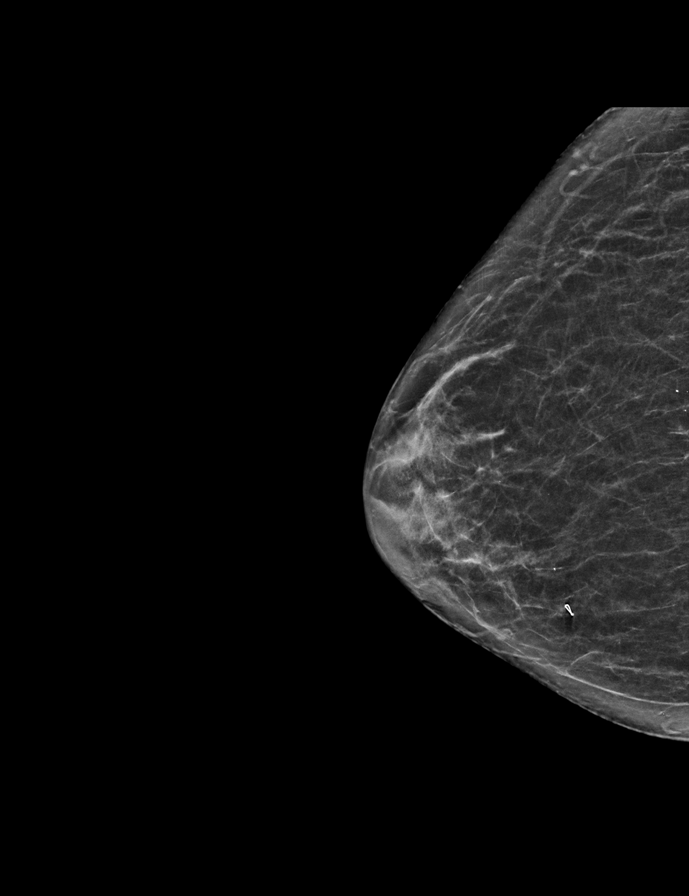

[R MLO synth-2D]
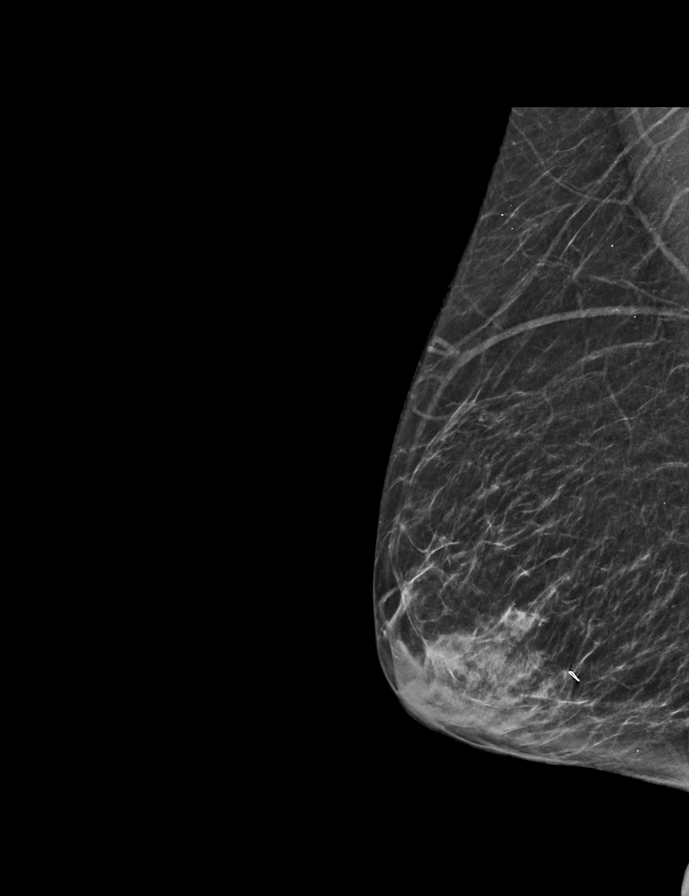

[R MLO tomo · 2 of 53 frames shown]
[frame 18/53]
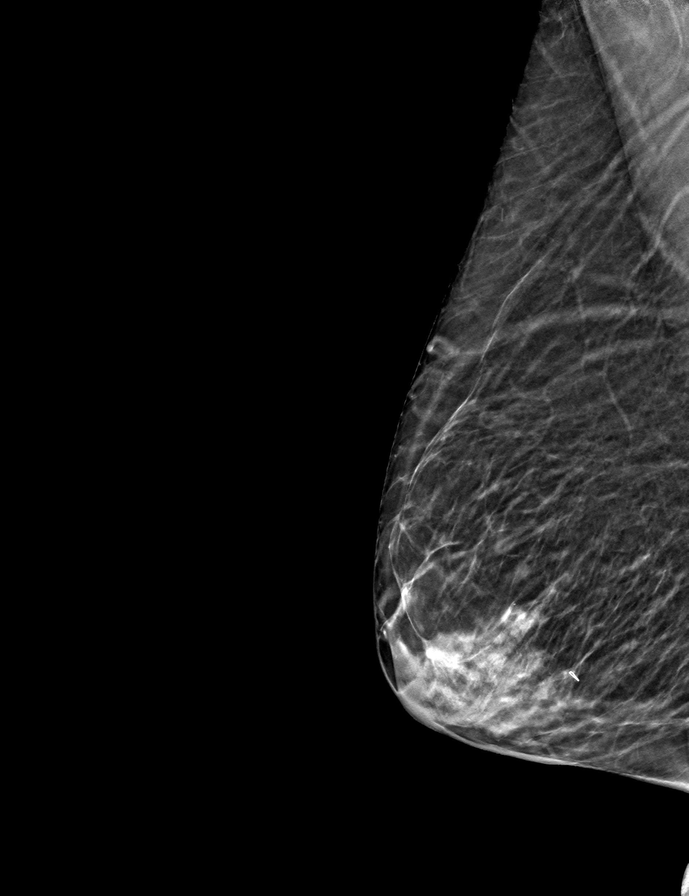
[frame 27/53]
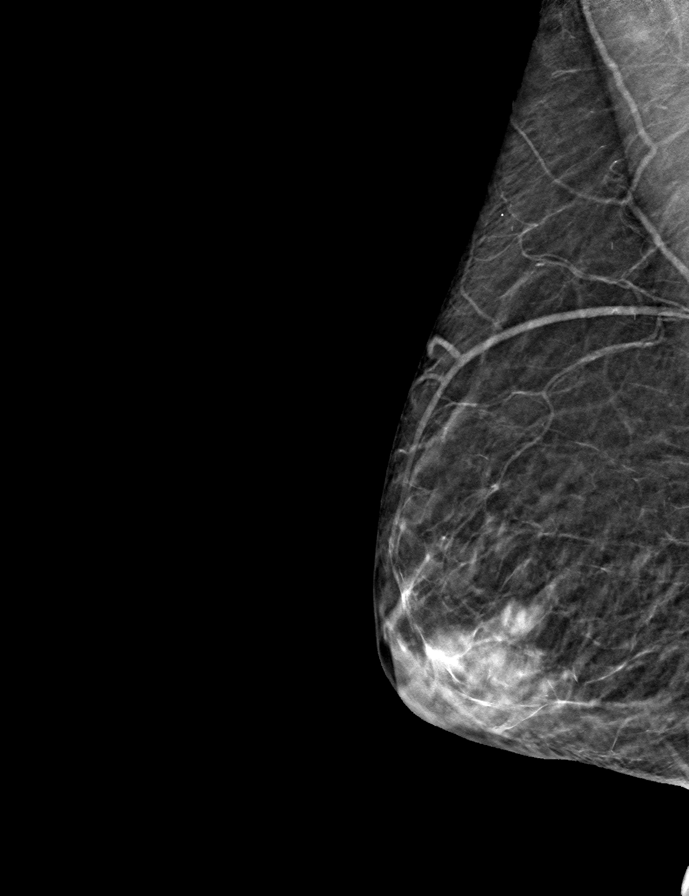

[R CC tomo · tomo slice 27/52.0]
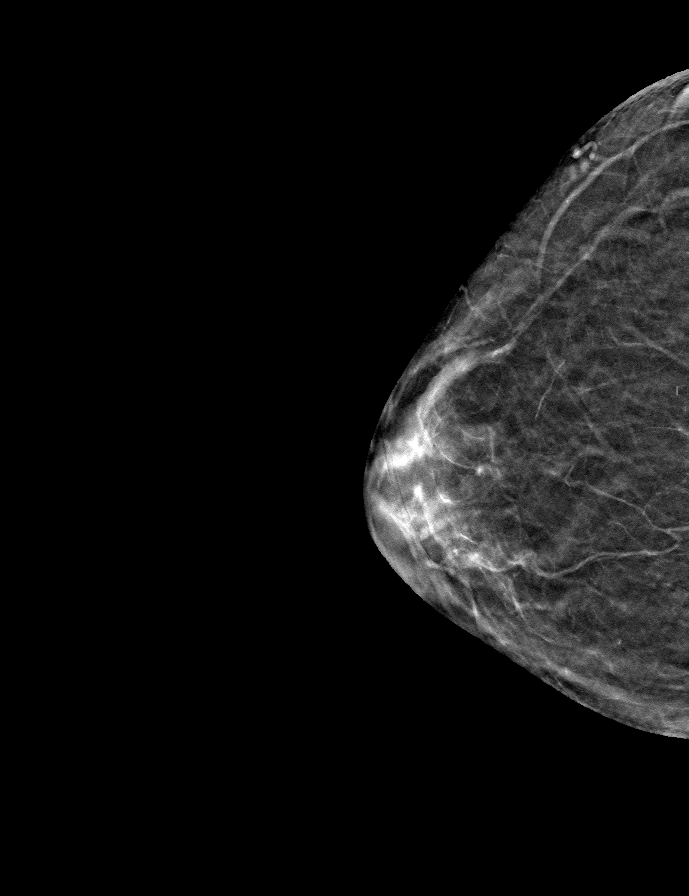

[L MLO tomo · tomo slice 26/51.0]
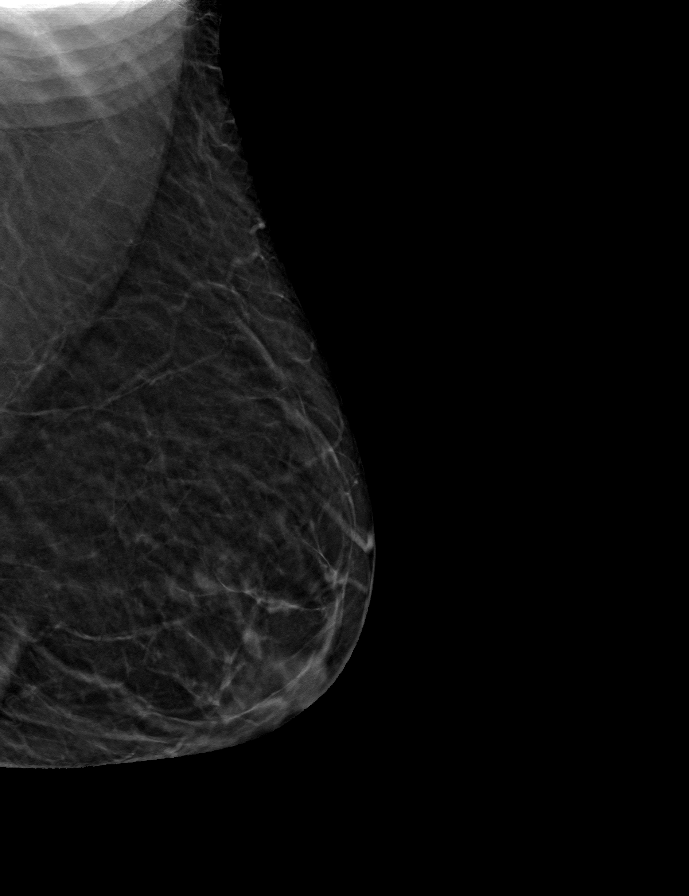

[L CC tomo · tomo slice 28/55.0]
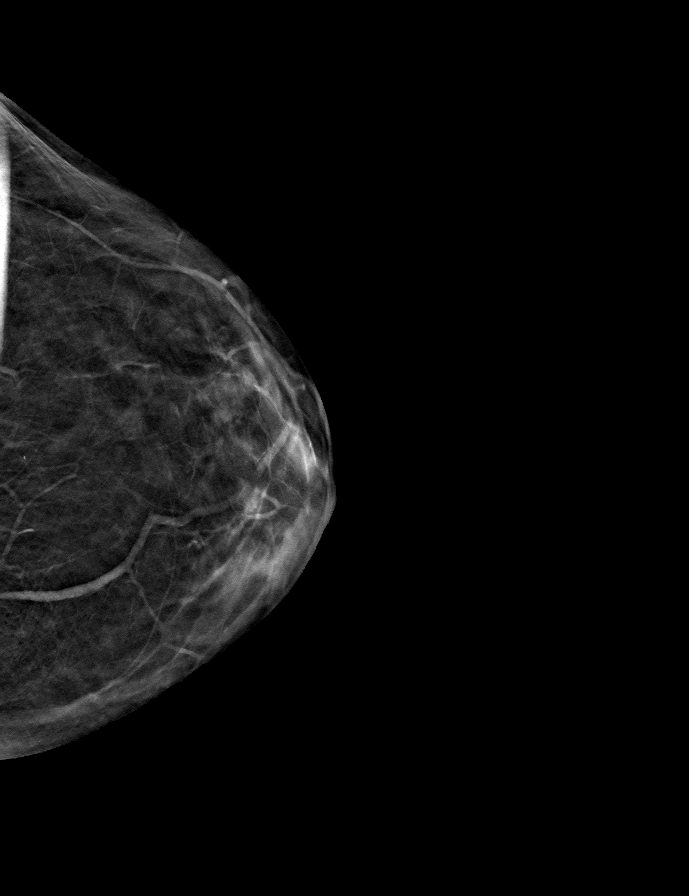

[9 of 24 positions shown; findings below may reference images not displayed]

ACR Breast Density Category b: There are scattered areas of
fibroglandular density.
FINDINGS: There are no findings suspicious for malignancy.
IMPRESSION: No mammographic evidence of malignancy. A result letter of this
screening mammogram will be mailed directly to the patient.

RECOMMENDATION:
Screening mammogram in one year. (Code:51-O-LD2)

BI-RADS CATEGORY  1: Negative.

## 2022-06-28 ENCOUNTER — Encounter: Payer: Self-pay | Admitting: Oncology

## 2022-07-06 DIAGNOSIS — Q525 Fusion of labia: Secondary | ICD-10-CM | POA: Diagnosis not present

## 2022-07-06 DIAGNOSIS — C210 Malignant neoplasm of anus, unspecified: Secondary | ICD-10-CM | POA: Diagnosis not present

## 2022-07-06 DIAGNOSIS — N3281 Overactive bladder: Secondary | ICD-10-CM | POA: Diagnosis not present

## 2022-07-07 ENCOUNTER — Ambulatory Visit: Payer: Medicare HMO

## 2022-07-10 ENCOUNTER — Other Ambulatory Visit: Payer: Self-pay | Admitting: Oncology

## 2022-07-11 DIAGNOSIS — H524 Presbyopia: Secondary | ICD-10-CM | POA: Diagnosis not present

## 2022-07-14 ENCOUNTER — Encounter: Payer: Self-pay | Admitting: Nurse Practitioner

## 2022-07-14 ENCOUNTER — Inpatient Hospital Stay (HOSPITAL_BASED_OUTPATIENT_CLINIC_OR_DEPARTMENT_OTHER): Payer: Medicare HMO | Admitting: Nurse Practitioner

## 2022-07-14 ENCOUNTER — Inpatient Hospital Stay: Payer: Medicare HMO

## 2022-07-14 ENCOUNTER — Inpatient Hospital Stay: Payer: Medicare HMO | Attending: Nurse Practitioner

## 2022-07-14 VITALS — BP 116/63 | HR 73

## 2022-07-14 DIAGNOSIS — Z5112 Encounter for antineoplastic immunotherapy: Secondary | ICD-10-CM | POA: Diagnosis not present

## 2022-07-14 DIAGNOSIS — C772 Secondary and unspecified malignant neoplasm of intra-abdominal lymph nodes: Secondary | ICD-10-CM | POA: Diagnosis not present

## 2022-07-14 DIAGNOSIS — C210 Malignant neoplasm of anus, unspecified: Secondary | ICD-10-CM | POA: Insufficient documentation

## 2022-07-14 DIAGNOSIS — C7989 Secondary malignant neoplasm of other specified sites: Secondary | ICD-10-CM | POA: Insufficient documentation

## 2022-07-14 DIAGNOSIS — Z79899 Other long term (current) drug therapy: Secondary | ICD-10-CM | POA: Insufficient documentation

## 2022-07-14 LAB — CMP (CANCER CENTER ONLY)
ALT: 13 U/L (ref 0–44)
AST: 23 U/L (ref 15–41)
Albumin: 4.3 g/dL (ref 3.5–5.0)
Alkaline Phosphatase: 61 U/L (ref 38–126)
Anion gap: 10 (ref 5–15)
BUN: 26 mg/dL — ABNORMAL HIGH (ref 8–23)
CO2: 23 mmol/L (ref 22–32)
Calcium: 9.6 mg/dL (ref 8.9–10.3)
Chloride: 102 mmol/L (ref 98–111)
Creatinine: 0.97 mg/dL (ref 0.44–1.00)
GFR, Estimated: >60 mL/min (ref >60)
Glucose, Bld: 72 mg/dL (ref 70–99)
Potassium: 4.5 mmol/L (ref 3.5–5.1)
Sodium: 135 mmol/L (ref 135–145)
Total Bilirubin: 0.3 mg/dL (ref 0.3–1.2)
Total Protein: 7.8 g/dL (ref 6.5–8.1)

## 2022-07-14 LAB — CBC WITH DIFFERENTIAL (CANCER CENTER ONLY)
Abs Immature Granulocytes: 0.01 10*3/uL (ref 0.00–0.07)
Basophils Absolute: 0 10*3/uL (ref 0.0–0.1)
Basophils Relative: 1 %
Eosinophils Absolute: 0.2 10*3/uL (ref 0.0–0.5)
Eosinophils Relative: 4 %
HCT: 37 % (ref 36.0–46.0)
Hemoglobin: 11.6 g/dL — ABNORMAL LOW (ref 12.0–15.0)
Immature Granulocytes: 0 %
Lymphocytes Relative: 17 %
Lymphs Abs: 0.8 10*3/uL (ref 0.7–4.0)
MCH: 28.6 pg (ref 26.0–34.0)
MCHC: 31.4 g/dL (ref 30.0–36.0)
MCV: 91.1 fL (ref 80.0–100.0)
Monocytes Absolute: 0.3 10*3/uL (ref 0.1–1.0)
Monocytes Relative: 7 %
Neutro Abs: 3.4 10*3/uL (ref 1.7–7.7)
Neutrophils Relative %: 71 %
Platelet Count: 215 10*3/uL (ref 150–400)
RBC: 4.06 MIL/uL (ref 3.87–5.11)
RDW: 14.4 % (ref 11.5–15.5)
WBC Count: 4.8 10*3/uL (ref 4.0–10.5)
nRBC: 0 % (ref 0.0–0.2)

## 2022-07-14 LAB — TSH: TSH: 3.408 u[IU]/mL (ref 0.350–4.500)

## 2022-07-14 MED ORDER — SODIUM CHLORIDE 0.9 % IV SOLN: Freq: Once | INTRAVENOUS | Status: AC

## 2022-07-14 MED ORDER — SODIUM CHLORIDE 0.9 % IV SOLN
480.0000 mg | Freq: Once | INTRAVENOUS | Status: AC
  Administered 2022-07-14: 480 mg via INTRAVENOUS
  Filled 2022-07-14: qty 48

## 2022-07-14 MED ORDER — LORAZEPAM 0.5 MG PO TABS: 0.5000 mg | ORAL_TABLET | Freq: Three times a day (TID) | ORAL | 0 refills | Status: DC | PRN

## 2022-07-14 NOTE — Patient Instructions (Signed)
[facility]   Discharge Instructions: Thank you for choosing [facility] to provide your oncology and hematology care.   If you have a lab appointment with the [facility], please go directly to the [facility] and check in at the registration area.   Wear comfortable clothing and clothing appropriate for easy access to any Portacath or PICC line.   We strive to give you quality time with your provider. You may need to reschedule your appointment if you arrive late (15 or more minutes).  Arriving late affects you and other patients whose appointments are after yours.  Also, if you miss three or more appointments without notifying the office, you may be dismissed from the clinic at the provider's discretion.      For prescription refill requests, have your pharmacy contact our office and allow 72 hours for refills to be completed.    Today you received the following chemotherapy and/or immunotherapy agents Nivolumab.      To help prevent nausea and vomiting after your treatment, we encourage you to take your nausea medication as directed.  BELOW ARE SYMPTOMS THAT SHOULD BE REPORTED IMMEDIATELY: *FEVER GREATER THAN 100.4 F (38 C) OR HIGHER *CHILLS OR SWEATING *NAUSEA AND VOMITING THAT IS NOT CONTROLLED WITH YOUR NAUSEA MEDICATION *UNUSUAL SHORTNESS OF BREATH *UNUSUAL BRUISING OR BLEEDING *URINARY PROBLEMS (pain or burning when urinating, or frequent urination) *BOWEL PROBLEMS (unusual diarrhea, constipation, pain near the anus) TENDERNESS IN MOUTH AND THROAT WITH OR WITHOUT PRESENCE OF ULCERS (sore throat, sores in mouth, or a toothache) UNUSUAL RASH, SWELLING OR PAIN  UNUSUAL VAGINAL DISCHARGE OR ITCHING   Items with * indicate a potential emergency and should be followed up as soon as possible or go to the Emergency Department if any problems should occur.  Please show the CHEMOTHERAPY ALERT CARD or IMMUNOTHERAPY ALERT CARD at check-in to  the Emergency Department and triage nurse.  Should you have questions after your visit or need to cancel or reschedule your appointment, please contact [facility]  Dept: [phone]  and follow the prompts.  Office hours are 8:00 a.m. to 4:30 p.m. Monday - Friday. Please note that voicemails left after 4:00 p.m. may not be returned until the following business day.  We are closed weekends and major holidays. You have access to a nurse at all times for urgent questions. Please call the main number to the clinic Dept: [phone] and follow the prompts.   For any non-urgent questions, you may also contact your provider using MyChart. We now offer e-Visits for anyone 58 and older to request care online for non-urgent symptoms. For details visit [email or web address].   Also download the MyChart app! Go to the app store, search "MyChart", open the app, select [facility], and log in with your MyChart username and password.  Masks are optional in the cancer centers. If you would like for your care team to wear a mask while they are taking care of you, please let them know. You may have one support person who is at least 73 years old accompany you for your appointments.  Nivolumab Injection What is this medication? NIVOLUMAB (nye VOL ue mab) treats some types of cancer. It works by helping your immune system slow or stop the spread of cancer cells. It is a monoclonal antibody. This medicine may be used for other purposes; ask your health care provider or pharmacist if you have questions. COMMON BRAND NAME(S): Opdivo What  should I tell my care team before I take this medication? They need to know if you have any of these conditions: Allogeneic stem cell transplant (uses someone else's stem cells) Autoimmune diseases, such as Crohn disease, ulcerative colitis, lupus History of chest radiation Nervous system problems, such as Guillain-Barre syndrome or myasthenia  gravis Organ transplant An unusual or allergic reaction to nivolumab, other medications, foods, dyes, or preservatives Pregnant or trying to get pregnant Breast-feeding How should I use this medication? This medication is infused into a vein. It is given in a hospital or clinic setting. A special MedGuide will be given to you before each treatment. Be sure to read this information carefully each time. Talk to your care team about the use of this medication in children. While it may be prescribed for children as young as 12 years for selected conditions, precautions do apply. Overdosage: If you think you have taken too much of this medicine contact a poison control center or emergency room at once. NOTE: This medicine is only for you. Do not share this medicine with others. What if I miss a dose? Keep appointments for follow-up doses. It is important not to miss your dose. Call your care team if you are unable to keep an appointment. What may interact with this medication? Interactions have not been studied. This list may not describe all possible interactions. Give your health care provider a list of all the medicines, herbs, non-prescription drugs, or dietary supplements you use. Also tell them if you smoke, drink alcohol, or use illegal drugs. Some items may interact with your medicine. What should I watch for while using this medication? Your condition will be monitored carefully while you are receiving this medication. You may need blood work while taking this medication. This medication may cause serious skin reactions. They can happen weeks to months after starting the medication. Contact your care team right away if you notice fevers or flu-like symptoms with a rash. The rash may be red or purple and then turn into blisters or peeling of the skin. You may also notice a red rash with swelling of the face, lips, or lymph nodes in your neck or under your arms. Tell your care team right away if  you have any change in your eyesight. Talk to your care team if you are pregnant or think you might be pregnant. A negative pregnancy test is required before starting this medication. A reliable form of contraception is recommended while taking this medication and for 5 months after the last dose. Talk to your care team about effective forms of contraception. Do not breast-feed while taking this medication and for 5 months after the last dose. What side effects may I notice from receiving this medication? Side effects that you should report to your care team as soon as possible: Allergic reactions--skin rash, itching, hives, swelling of the face, lips, tongue, or throat Dry cough, shortness of breath or trouble breathing Eye pain, redness, irritation, or discharge with blurry or decreased vision Heart muscle inflammation--unusual weakness or fatigue, shortness of breath, chest pain, fast or irregular heartbeat, dizziness, swelling of the ankles, feet, or hands Hormone gland problems--headache, sensitivity to light, unusual weakness or fatigue, dizziness, fast or irregular heartbeat, increased sensitivity to cold or heat, excessive sweating, constipation, hair loss, increased thirst or amount of urine, tremors or shaking, irritability Infusion reactions--chest pain, shortness of breath or trouble breathing, feeling faint or lightheaded Kidney injury (glomerulonephritis)--decrease in the amount of urine, red or dark  brown urine, foamy or bubbly urine, swelling of the ankles, hands, or feet Liver injury--right upper belly pain, loss of appetite, nausea, light-colored stool, dark yellow or brown urine, yellowing skin or eyes, unusual weakness or fatigue Pain, tingling, or numbness in the hands or feet, muscle weakness, change in vision, confusion or trouble speaking, loss of balance or coordination, trouble walking, seizures Rash, fever, and swollen lymph nodes Redness, blistering, peeling, or loosening  of the skin, including inside the mouth Sudden or severe stomach pain, bloody diarrhea, fever, nausea, vomiting Side effects that usually do not require medical attention (report these to your care team if they continue or are bothersome): Bone, joint, or muscle pain Diarrhea Fatigue Loss of appetite Nausea Skin rash This list may not describe all possible side effects. Call your doctor for medical advice about side effects. You may report side effects to FDA at 1-800-FDA-1088. Where should I keep my medication? This medication is given in a hospital or clinic. It will not be stored at home. NOTE: This sheet is a summary. It may not cover all possible information. If you have questions about this medicine, talk to your doctor, pharmacist, or health care provider.  2023 Elsevier/Gold Standard (2022-01-03 00:00:00)

## 2022-07-14 NOTE — Progress Notes (Signed)
Patient seen by Lisa Thomas NP today  Vitals are within treatment parameters.  Labs reviewed by Lisa Thomas NP and are within treatment parameters.  Per physician team, patient is ready for treatment and there are NO modifications to the treatment plan.     

## 2022-07-14 NOTE — Progress Notes (Signed)
[place] OFFICE PROGRESS NOTE   Diagnosis: Anal cancer  INTERVAL HISTORY:   Ashley Moore returns as scheduled.  She completed another cycle of nivolumab 05/16/2022.  No rash or diarrhea.  She has a good appetite.  She is no longer having urinary symptoms.  Specifically no hematuria.  Objective:  Vital signs in last 24 hours:  Blood pressure 134/71, pulse 90, temperature 98.1 F (36.7 C), temperature source Oral, resp. rate 18, height '5\' 2"'$  (1.575 m), weight 163 lb 3.2 oz (74 kg), SpO2 100 %.    HEENT: No thrush or ulcers. Lymphatics: No palpable cervical, supraclavicular, axillary or inguinal lymph nodes. Resp: Faint rales at both lung bases.  No respiratory distress. Cardio: Regular rate and rhythm. GI: Abdomen soft and nontender.  No hepatosplenomegaly. Vascular: Trace bilateral lower leg edema. Skin: No rash.   Lab Results:  Lab Results  Component Value Date   WBC 4.8 07/14/2022   HGB 11.6 (L) 07/14/2022   HCT 37.0 07/14/2022   MCV 91.1 07/14/2022   PLT 215 07/14/2022   NEUTROABS 3.4 07/14/2022    Imaging:  No results found.  Medications: I have reviewed the patient's current medications.  Assessment/Plan: Anal cancer CT abdomen/pelvis 10/21/2016-thickening of the anus extending to the junction between the anus and rectum with a large stool ball in the rectum and mild fat stranding posterior to the rectum. Enlarged lymph nodes in the right and left inguinal regions. A few mildly prominent nodes seen posterior to the rectum. 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 PET scan [id number] hypermetabolic anal mass with hypermetabolic metastases to the groin region bilaterally, left pelvic sidewall and presacral space. Initiation of radiation and cycle 1 5-FU/mitomycin C 11/14/2016 Cycle 2 5-FU/mitomycin C 12/12/2016 (5-FU dose reduced due to mucositis, diarrhea, skin breakdown) Radiation completed 12/23/2016 CT  abdomen/pelvis 04/14/2017-resolution of anal mass and bilateral inguinal lymphadenopathy. No residual tumor seen. CT abdomen/pelvis 01/28/2020-right hydroureteronephrosis, transition at the level of the mid right ureter, retroperitoneal lymphadenopathy CT renal stone study 01/28/2020-severe right hydronephrosis and proximal right hydroureter, 8 mm area of increased attenuation in the proximal right ureter-stone versus soft tissue mass PET 02/13/2020-hypermetabolic right retroperitoneal nodal metastases in the aortocaval and posterior pericaval chains, nonspecific mild anal wall hypermetabolism, right nephroureteral stent 02/21/2020 anorectal exam per Dr. Ashley Jacobs radiation changes of the skin around the anal margin.  Posterior midline smooth scarring.  Mildly stenotic.  Stenosis seems better.  No palpable concerning lesions.  Entire anal canal and distal rectum feels smooth and healthy.  Partial anoscopy performed with no lesions in the anal canal. Xeloda/radiation beginning 03/02/2020, completed 04/13/2020 CT abdomen/pelvis 06/17/2020-resolution of right retroperitoneal lymphadenopathy, no remaining pathologically enlarged lymph nodes, residual mild right hydronephrosis, no evidence of progressive disease, stable anal wall thickening Digital rectal exam by Dr. Quentin Cornwall 08/26/2020-posterior midline smooth scarring.  Mildly stenotic.  Stenosis seems better.  No palpable concerning lesions.  Anal canal feels smooth without palpable concern.  Unable to tolerate anoscopy.  Next visit in 6 months for surveillance. CT abdomen/pelvis 10/28/2020-no evidence of local recurrence or metastatic disease.  Stable mild rectal wall thickening.  New diffuse bladder wall thickening possibly related to prior radiation CT abdomen/pelvis 01/16/2021- recurrent right-sided hydronephrosis and proximal right hydroureter status post removal of right ureter stent, new soft tissue nodule along the course of the right ureter between the  right psoas and IVC suspicious for recurrent lymphadenopathy PET 02/12/2021-small focus of hypermetabolism at the anorectal junction without a definable mass, right retroperitoneal nodal  metastasis with obstruction of the proximal right ureter, right middle lobe hypermetabolic nodule, hypermetabolism in the right hilum without a defined nodal mass, hypermetabolic left supraclavicular and left axillary nodes 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 Cycle 1 Taxol/carboplatin 03/11/2021 Cycle 2 Taxol/carboplatin 03/31/2021, Fulphila Cycle 3 Taxol/carboplatin 04/21/2021, Fulphila CTs 05/11/2021-decrease size of FDG avid nodule in right middle lobe, right middle lobe subpleural nodule slightly enlarged, decreased in size of left axillary and subpectoral nodes, unchanged FDG avid right iliac nodes Cycle 4 Taxol/carboplatin 05/12/2021, G-CSF discontinued secondary to poor tolerance, Taxol and carboplatin dose reduced Cycle 5 Taxol/carboplatin 06/02/2021 Cycle 6 carboplatin 07/01/2021, Taxol held due to neuropathy Cycle 7 carboplatin 07/29/2021, Taxol held due to neuropathy CTs 08/16/2021-new liver lesion, increase in right psoas lymph node, unchanged right hydronephrosis, new mediastinal and right hilar lymphadenopathy, enlargement of bilateral pulmonary nodules and at least one new nodule, new subcarinal node Cycle 1 nivolumab 08/25/2021 Cycle 2 nivolumab 09/08/2021 Cycle 3 nivolumab 09/22/2021 Cycle 4 nivolumab 10/06/2021 Cycle 5 nivolumab 10/20/2021 Cycle 6 nivolumab 11/04/2021 CTs 11/12/2021-resolution of lung nodules, decreased size of right psoas node, no remaining mediastinal lymphadenopathy, resolution of right liver lesion Cycle 7 nivolumab 11/17/2021 Cycle 8 nivolumab 12/01/2021 Cycle 9 nivolumab 12/15/2021 Cycle 10 nivolumab 12/30/2021 Cycle 11 nivolumab 01/12/2022 Cycle 12 nivolumab 5/10/248-monthy dosing Cycle 13 nivolumab 02/23/2022 CT  03/16/2022-stable right psoas mass, no pulmonary nodules, persistent circumferential anal thickening, unchanged subcentimeter left liver lesion Cycle 14 nivolumab 03/24/2022 Cycle 15 nivolumab 04/21/2022 Cycle 16 nivolumab 05/19/2022 Cycle 17 nivolumab 06/16/2022 Cycle 18 nivolumab 07/14/2022 Left labial lesions. Question direct extension from the anal cancer versus metastatic disease from anal cancer versus a separate malignant process. 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 Multiple family members with breast cancer. History of hypokalemia-likely secondary to decreased nutritional intake and diarrhea; potassium in normal range 01/16/2017. No longer taking a potassium supplement. Right hydroureteronephrosis on CT 01/28/2020, status post a cystoscopy/pyelogram 01/28/2020 confirming extrinsic compression of the right ureter, status post stent placement Right ureter stent exchange 06/12/2020 Right ureter stent removed 01/13/2021   8.  Admission 01/07/2021 with Klebsiella pneumoniae urosepsis/bacteremia  9.  Hospital admission 05/15/2021 with UTI/possible right pyelonephritis    Disposition: Ms. KBiedaappears well.  There is no clinical evidence of disease progression.  Plan to continue nivolumab on a 4-week schedule.  Restaging CTs prior to next office visit.  CBC and chemistry panel reviewed.  Labs adequate to proceed as above.  She will return for follow-up in 4 weeks.    LNed CardANP/GNP-BC   07/14/2022  2:20 PM

## 2022-07-16 LAB — T4: T4, Total: 9.3 ug/dL (ref 4.5–12.0)

## 2022-07-27 DIAGNOSIS — H5213 Myopia, bilateral: Secondary | ICD-10-CM | POA: Diagnosis not present

## 2022-07-27 DIAGNOSIS — H52209 Unspecified astigmatism, unspecified eye: Secondary | ICD-10-CM | POA: Diagnosis not present

## 2022-08-04 ENCOUNTER — Ambulatory Visit
Admission: RE | Admit: 2022-08-04 | Discharge: 2022-08-04 | Disposition: A | Discharge status: Home / Self Care | Payer: Medicare HMO | Source: Ambulatory Visit | Attending: Internal Medicine | Admitting: Internal Medicine

## 2022-08-04 DIAGNOSIS — Z1231 Encounter for screening mammogram for malignant neoplasm of breast: Secondary | ICD-10-CM | POA: Diagnosis not present

## 2022-08-13 ENCOUNTER — Other Ambulatory Visit: Payer: Self-pay | Admitting: Oncology

## 2022-08-16 ENCOUNTER — Encounter (HOSPITAL_BASED_OUTPATIENT_CLINIC_OR_DEPARTMENT_OTHER): Payer: Self-pay

## 2022-08-16 ENCOUNTER — Ambulatory Visit (HOSPITAL_BASED_OUTPATIENT_CLINIC_OR_DEPARTMENT_OTHER)
Admission: RE | Admit: 2022-08-16 | Discharge: 2022-08-16 | Disposition: A | Discharge status: Home / Self Care | Payer: Medicare HMO | Source: Ambulatory Visit | Attending: Nurse Practitioner | Admitting: Nurse Practitioner

## 2022-08-16 DIAGNOSIS — C772 Secondary and unspecified malignant neoplasm of intra-abdominal lymph nodes: Secondary | ICD-10-CM | POA: Diagnosis not present

## 2022-08-16 DIAGNOSIS — J984 Other disorders of lung: Secondary | ICD-10-CM | POA: Diagnosis not present

## 2022-08-16 DIAGNOSIS — K769 Liver disease, unspecified: Secondary | ICD-10-CM | POA: Diagnosis not present

## 2022-08-16 DIAGNOSIS — N3289 Other specified disorders of bladder: Secondary | ICD-10-CM | POA: Diagnosis not present

## 2022-08-16 MED ORDER — IOHEXOL 300 MG/ML  SOLN
100.0000 mL | Freq: Once | INTRAMUSCULAR | Status: AC | PRN
  Administered 2022-08-16: 85 mL via INTRAVENOUS

## 2022-08-18 ENCOUNTER — Inpatient Hospital Stay: Payer: Medicare HMO | Attending: Nurse Practitioner | Admitting: Oncology

## 2022-08-18 ENCOUNTER — Inpatient Hospital Stay: Payer: Medicare HMO

## 2022-08-18 VITALS — BP 138/79 | HR 67 | Temp 98.2°F | Resp 18 | Ht 62.0 in | Wt 166.0 lb

## 2022-08-18 DIAGNOSIS — C772 Secondary and unspecified malignant neoplasm of intra-abdominal lymph nodes: Secondary | ICD-10-CM | POA: Diagnosis not present

## 2022-08-18 DIAGNOSIS — C210 Malignant neoplasm of anus, unspecified: Secondary | ICD-10-CM | POA: Diagnosis not present

## 2022-08-18 DIAGNOSIS — C7989 Secondary malignant neoplasm of other specified sites: Secondary | ICD-10-CM | POA: Diagnosis not present

## 2022-08-18 DIAGNOSIS — C778 Secondary and unspecified malignant neoplasm of lymph nodes of multiple regions: Secondary | ICD-10-CM | POA: Insufficient documentation

## 2022-08-18 DIAGNOSIS — Z79899 Other long term (current) drug therapy: Secondary | ICD-10-CM | POA: Diagnosis not present

## 2022-08-18 DIAGNOSIS — Z5112 Encounter for antineoplastic immunotherapy: Secondary | ICD-10-CM | POA: Insufficient documentation

## 2022-08-18 LAB — TSH: TSH: 3.762 u[IU]/mL (ref 0.350–4.500)

## 2022-08-18 LAB — CBC WITH DIFFERENTIAL (CANCER CENTER ONLY)
Abs Immature Granulocytes: 0.01 10*3/uL (ref 0.00–0.07)
Basophils Absolute: 0 10*3/uL (ref 0.0–0.1)
Basophils Relative: 1 %
Eosinophils Absolute: 0.1 10*3/uL (ref 0.0–0.5)
Eosinophils Relative: 2 %
HCT: 36.9 % (ref 36.0–46.0)
Hemoglobin: 11.7 g/dL — ABNORMAL LOW (ref 12.0–15.0)
Immature Granulocytes: 0 %
Lymphocytes Relative: 12 %
Lymphs Abs: 0.6 10*3/uL — ABNORMAL LOW (ref 0.7–4.0)
MCH: 29.1 pg (ref 26.0–34.0)
MCHC: 31.7 g/dL (ref 30.0–36.0)
MCV: 91.8 fL (ref 80.0–100.0)
Monocytes Absolute: 0.3 10*3/uL (ref 0.1–1.0)
Monocytes Relative: 7 %
Neutro Abs: 4 10*3/uL (ref 1.7–7.7)
Neutrophils Relative %: 78 %
Platelet Count: 202 10*3/uL (ref 150–400)
RBC: 4.02 MIL/uL (ref 3.87–5.11)
RDW: 14.6 % (ref 11.5–15.5)
WBC Count: 5.1 10*3/uL (ref 4.0–10.5)
nRBC: 0 % (ref 0.0–0.2)

## 2022-08-18 LAB — CMP (CANCER CENTER ONLY)
ALT: 12 U/L (ref 0–44)
AST: 24 U/L (ref 15–41)
Albumin: 4.2 g/dL (ref 3.5–5.0)
Alkaline Phosphatase: 48 U/L (ref 38–126)
Anion gap: 9 (ref 5–15)
BUN: 32 mg/dL — ABNORMAL HIGH (ref 8–23)
CO2: 24 mmol/L (ref 22–32)
Calcium: 9.6 mg/dL (ref 8.9–10.3)
Chloride: 103 mmol/L (ref 98–111)
Creatinine: 0.98 mg/dL (ref 0.44–1.00)
GFR, Estimated: >60 mL/min (ref >60)
Glucose, Bld: 73 mg/dL (ref 70–99)
Potassium: 4.4 mmol/L (ref 3.5–5.1)
Sodium: 136 mmol/L (ref 135–145)
Total Bilirubin: 0.3 mg/dL (ref 0.3–1.2)
Total Protein: 7.4 g/dL (ref 6.5–8.1)

## 2022-08-18 MED ORDER — SODIUM CHLORIDE 0.9 % IV SOLN: Freq: Once | INTRAVENOUS | Status: AC

## 2022-08-18 MED ORDER — SODIUM CHLORIDE 0.9 % IV SOLN
480.0000 mg | Freq: Once | INTRAVENOUS | Status: AC
  Administered 2022-08-18: 480 mg via INTRAVENOUS
  Filled 2022-08-18: qty 48

## 2022-08-18 NOTE — Progress Notes (Signed)
[place] OFFICE PROGRESS NOTE   Diagnosis: Anal cancer  INTERVAL HISTORY:   Ashley Moore returns as scheduled.  She feels well.  Leg edema improves at night.  No new complaint.  She continues nivolumab on a monthly schedule.  Objective:  Vital signs in last 24 hours:  Blood pressure 138/79, pulse 67, temperature 98.2 F (36.8 C), temperature source Oral, resp. rate 18, height '5\' 2"'$  (1.575 m), weight 166 lb (75.3 kg), SpO2 98 %.   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 Vascular: Trace ankle edema bilaterally   Lab Results:  Lab Results  Component Value Date   WBC 5.1 08/18/2022   HGB 11.7 (L) 08/18/2022   HCT 36.9 08/18/2022   MCV 91.8 08/18/2022   PLT 202 08/18/2022   NEUTROABS 4.0 08/18/2022    CMP  Lab Results  Component Value Date   NA 136 08/18/2022   K 4.4 08/18/2022   CL 103 08/18/2022   CO2 24 08/18/2022   GLUCOSE 73 08/18/2022   BUN 32 (H) 08/18/2022   CREATININE 0.98 08/18/2022   CALCIUM 9.6 08/18/2022   PROT 7.4 08/18/2022   ALBUMIN 4.2 08/18/2022   AST 24 08/18/2022   ALT 12 08/18/2022   ALKPHOS 48 08/18/2022   BILITOT 0.3 08/18/2022   GFRNONAA >60 08/18/2022   GFRAA >60 06/17/2020    Lab Results  Component Value Date   CEA1 1.15 03/04/2021   CEA 1.16 03/04/2021    Lab Results  Component Value Date   INR 1.2 05/16/2021   LABPROT 15.2 05/16/2021    Imaging:  CT CHEST ABDOMEN PELVIS W CONTRAST  Result Date: 08/17/2022 CLINICAL DATA:  71 year old female with history of anal cancer. Restaging examination to assess response to therapy (patient is currently on chemotherapy and immunotherapy). * Tracking Code: BO * EXAM: CT CHEST, ABDOMEN, AND PELVIS WITH CONTRAST TECHNIQUE: Multidetector CT imaging of the chest, abdomen and pelvis was performed  following the standard protocol during bolus administration of intravenous contrast. RADIATION DOSE REDUCTION: This exam was performed according to the departmental dose-optimization program which includes automated exposure control, adjustment of the mA and/or kV according to patient size and/or use of iterative reconstruction technique. CONTRAST:  47m OMNIPAQUE IOHEXOL 300 MG/ML  SOLN COMPARISON:  CT of the chest, abdomen and pelvis 03/16/2022. FINDINGS: CT CHEST FINDINGS Cardiovascular: Heart size is normal. There is no significant pericardial fluid, thickening or pericardial calcification. There is aortic atherosclerosis, as well as atherosclerosis of the great vessels of the mediastinum and the coronary arteries, including calcified atherosclerotic plaque in the left main, left anterior descending and left circumflex coronary arteries. Mediastinum/Nodes: No pathologically enlarged mediastinal or hilar lymph nodes. Esophagus is unremarkable in appearance. No axillary lymphadenopathy. Lungs/Pleura: No suspicious appearing pulmonary nodules or masses are noted. No acute consolidative airspace disease. No pleural effusions. Scattered areas of mild septal thickening are again noted throughout the lungs bilaterally. Musculoskeletal: There are no aggressive appearing lytic or blastic lesions noted in the visualized portions of the skeleton. CT ABDOMEN PELVIS FINDINGS Hepatobiliary: Subcentimeter low-attenuation lesion in segment 4B of the liver, too small to characterize, but similar to the prior study and statistically likely a tiny cyst. Very mild intrahepatic biliary ductal dilatation. Common bile duct is dilated to 11 mm in the porta hepatis. No calcified gallstones identified in the common bile duct. Gallbladder is nearly  completely decompressed, but otherwise unremarkable in appearance. Pancreas: No pancreatic mass. No pancreatic ductal dilatation. No pancreatic or peripancreatic fluid collections or  inflammatory changes. Spleen: Unremarkable. Adrenals/Urinary Tract: Severe atrophy of the right kidney. Chronic moderate right hydroureteronephrosis. This hydroureteronephrosis appears to terminate in the mid right ureter where the ureter is intimately associated with some ill-defined soft tissue in the right retroperitoneum, inseparable from the right psoas muscle, best appreciated on axial image 67 of series 2 where this measures approximately 3.3 x 2.5 cm. This is grossly similar to the prior examination (differences in measurement reflect differences in measurement technique rather than differences in size). Distal half of the right ureter is nondilated. Left kidney and bilateral adrenal glands are normal in appearance. No left hydroureteronephrosis. Circumferential thickening of the urinary bladder wall, similar to the prior study. Stomach/Bowel: The appearance of the stomach is unremarkable. There is no pathologic dilatation of small bowel or colon. Circumferential thickening of the anus again noted (axial image 102 of series 2), similar to the prior study. Normal appendix. Vascular/Lymphatic: Atherosclerosis in the abdominal aorta and pelvic vasculature. Right-sided retroperitoneal soft tissue (discussed above), likely lymphadenopathy. No other new lymphadenopathy noted elsewhere in the abdomen or pelvis. Reproductive: Status post supracervical hysterectomy. Ovaries are not confidently identified may be surgically absent or atrophic. Other: No significant volume of ascites.  No pneumoperitoneum. Musculoskeletal: There are no aggressive appearing lytic or blastic lesions noted in the visualized portions of the skeleton. IMPRESSION: 1. Stable examination. There continues to be an ill-defined soft tissue mass in the right retroperitoneum inseparable from the right psoas musculature, likely treated malignant lymphadenopathy. This remains intimately associated with the mid right ureter, proximal to which there is  chronic hydroureteronephrosis and severe atrophy of the right kidney (unchanged). Diffuse anal wall thickening is also unchanged. 2. Very mild intra and extrahepatic biliary ductal dilatation. No calcified choledocholithiasis identified on today's CT examination. The possibility of noncalcified choledocholithiasis or distal common bile duct stricture warrants consideration. If there is any biochemical evidence or clinical concern for biliary tract obstruction, further evaluation with nonemergent abdominal MRI with and without IV gadolinium with MRCP should be considered to better evaluate these findings. 3. Aortic atherosclerosis, in addition to left main and 2 vessel coronary artery disease. Please note that although the presence of coronary artery calcium documents the presence of coronary artery disease, the severity of this disease and any potential stenosis cannot be assessed on this non-gated CT examination. Assessment for potential risk factor modification, dietary therapy or pharmacologic therapy may be warranted, if clinically indicated. 4. Additional incidental findings, as above. Electronically Signed   By: Vinnie Langton M.D.   On: 08/17/2022 12:10    Medications: I have reviewed the patient's current medications.   Assessment/Plan: Anal cancer CT abdomen/pelvis 10/21/2016-thickening of the anus extending to the junction between the anus and rectum with a large stool ball in the rectum and mild fat stranding posterior to the rectum. Enlarged lymph nodes in the right and left inguinal regions. A few mildly prominent nodes seen posterior to the rectum. 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 PET scan [id number] hypermetabolic anal mass with hypermetabolic metastases to the groin region bilaterally, left pelvic sidewall and presacral space. Initiation of radiation and cycle 1 5-FU/mitomycin C 11/14/2016 Cycle 2 5-FU/mitomycin C 12/12/2016 (5-FU dose reduced due  to mucositis, diarrhea, skin breakdown) Radiation completed 12/23/2016 CT abdomen/pelvis 04/14/2017-resolution of anal mass and bilateral inguinal lymphadenopathy. No residual tumor seen. CT abdomen/pelvis 01/28/2020-right hydroureteronephrosis, transition at  the level of the mid right ureter, retroperitoneal lymphadenopathy CT renal stone study 01/28/2020-severe right hydronephrosis and proximal right hydroureter, 8 mm area of increased attenuation in the proximal right ureter-stone versus soft tissue mass PET 02/13/2020-hypermetabolic right retroperitoneal nodal metastases in the aortocaval and posterior pericaval chains, nonspecific mild anal wall hypermetabolism, right nephroureteral stent 02/21/2020 anorectal exam per Dr. Ashley Jacobs radiation changes of the skin around the anal margin.  Posterior midline smooth scarring.  Mildly stenotic.  Stenosis seems better.  No palpable concerning lesions.  Entire anal canal and distal rectum feels smooth and healthy.  Partial anoscopy performed with no lesions in the anal canal. Xeloda/radiation beginning 03/02/2020, completed 04/13/2020 CT abdomen/pelvis 06/17/2020-resolution of right retroperitoneal lymphadenopathy, no remaining pathologically enlarged lymph nodes, residual mild right hydronephrosis, no evidence of progressive disease, stable anal wall thickening Digital rectal exam by Dr. Quentin Cornwall 08/26/2020-posterior midline smooth scarring.  Mildly stenotic.  Stenosis seems better.  No palpable concerning lesions.  Anal canal feels smooth without palpable concern.  Unable to tolerate anoscopy.  Next visit in 6 months for surveillance. CT abdomen/pelvis 10/28/2020-no evidence of local recurrence or metastatic disease.  Stable mild rectal wall thickening.  New diffuse bladder wall thickening possibly related to prior radiation CT abdomen/pelvis 01/16/2021- recurrent right-sided hydronephrosis and proximal right hydroureter status post removal of right ureter  stent, new soft tissue nodule along the course of the right ureter between the right psoas and IVC suspicious for recurrent lymphadenopathy PET 02/12/2021-small focus of hypermetabolism at the anorectal junction without a definable mass, right retroperitoneal nodal metastasis with obstruction of the proximal right ureter, right middle lobe hypermetabolic nodule, hypermetabolism in the right hilum without a defined nodal mass, hypermetabolic left supraclavicular and left axillary nodes 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 Cycle 1 Taxol/carboplatin 03/11/2021 Cycle 2 Taxol/carboplatin 03/31/2021, Fulphila Cycle 3 Taxol/carboplatin 04/21/2021, Fulphila CTs 05/11/2021-decrease size of FDG avid nodule in right middle lobe, right middle lobe subpleural nodule slightly enlarged, decreased in size of left axillary and subpectoral nodes, unchanged FDG avid right iliac nodes Cycle 4 Taxol/carboplatin 05/12/2021, G-CSF discontinued secondary to poor tolerance, Taxol and carboplatin dose reduced Cycle 5 Taxol/carboplatin 06/02/2021 Cycle 6 carboplatin 07/01/2021, Taxol held due to neuropathy Cycle 7 carboplatin 07/29/2021, Taxol held due to neuropathy CTs 08/16/2021-new liver lesion, increase in right psoas lymph node, unchanged right hydronephrosis, new mediastinal and right hilar lymphadenopathy, enlargement of bilateral pulmonary nodules and at least one new nodule, new subcarinal node Cycle 1 nivolumab 08/25/2021 Cycle 2 nivolumab 09/08/2021 Cycle 3 nivolumab 09/22/2021 Cycle 4 nivolumab 10/06/2021 Cycle 5 nivolumab 10/20/2021 Cycle 6 nivolumab 11/04/2021 CTs 11/12/2021-resolution of lung nodules, decreased size of right psoas node, no remaining mediastinal lymphadenopathy, resolution of right liver lesion Cycle 7 nivolumab 11/17/2021 Cycle 8 nivolumab 12/01/2021 Cycle 9 nivolumab 12/15/2021 Cycle 10 nivolumab 12/30/2021 Cycle 11 nivolumab 01/12/2022 Cycle 12  nivolumab 5/10/275-monthy dosing Cycle 13 nivolumab 02/23/2022 CT 03/16/2022-stable right psoas mass, no pulmonary nodules, persistent circumferential anal thickening, unchanged subcentimeter left liver lesion Cycle 14 nivolumab 03/24/2022 Cycle 15 nivolumab 04/21/2022 Cycle 16 nivolumab 05/19/2022 Cycle 17 nivolumab 06/16/2022 Cycle 18 nivolumab 07/14/2022 CTs 08/16/2022-stable ill-defined soft tissue in the right retroperitoneum, very mild intra and EXTR hepatic biliary duct dilation, common bile duct dilated to 11 mm in the porta hepatis Cycle 19 nivolumab 08/18/2022 Left labial lesions. Question direct extension from the anal cancer versus metastatic disease from anal cancer versus a separate malignant process. History of pain and bleeding secondary to #1 and skin breakdown History  of Bowen's disease treated with vaginal surgery, topical agent early 1990s. Multiple family members with breast cancer. History of hypokalemia-likely secondary to decreased nutritional intake and diarrhea; potassium in normal range 01/16/2017. No longer taking a potassium supplement. Right hydroureteronephrosis on CT 01/28/2020, status post a cystoscopy/pyelogram 01/28/2020 confirming extrinsic compression of the right ureter, status post stent placement Right ureter stent exchange 06/12/2020 Right ureter stent removed 01/13/2021   8.  Admission 01/07/2021 with Klebsiella pneumoniae urosepsis/bacteremia  9.  Hospital admission 05/15/2021 with UTI/possible right pyelonephritis      Disposition: Ashley Moore appears stable.  She is in clinical remission from anal cancer.  She will continue monthly nivolumab.  The plan is to continue nivolumab for a 2-year course.  We will plan for repeat imaging in 6-8 months.  The dilated bile duct is likely a benign finding.  She will call for biliary symptoms.  She will complete another cycle of nivolumab today.  Ashley Moore will return for an office and lab visit in 1 month.  Betsy Coder, MD  08/18/2022  12:47 PM

## 2022-08-18 NOTE — Patient Instructions (Signed)
[facility]   Discharge Instructions: Thank you for choosing [facility] to provide your oncology and hematology care.   If you have a lab appointment with the [facility], please go directly to the [facility] and check in at the registration area.   Wear comfortable clothing and clothing appropriate for easy access to any Portacath or PICC line.   We strive to give you quality time with your provider. You may need to reschedule your appointment if you arrive late (15 or more minutes).  Arriving late affects you and other patients whose appointments are after yours.  Also, if you miss three or more appointments without notifying the office, you may be dismissed from the clinic at the provider's discretion.      For prescription refill requests, have your pharmacy contact our office and allow 72 hours for refills to be completed.    Today you received the following chemotherapy and/or immunotherapy agents Opdivo.      To help prevent nausea and vomiting after your treatment, we encourage you to take your nausea medication as directed.  BELOW ARE SYMPTOMS THAT SHOULD BE REPORTED IMMEDIATELY: *FEVER GREATER THAN 100.4 F (38 C) OR HIGHER *CHILLS OR SWEATING *NAUSEA AND VOMITING THAT IS NOT CONTROLLED WITH YOUR NAUSEA MEDICATION *UNUSUAL SHORTNESS OF BREATH *UNUSUAL BRUISING OR BLEEDING *URINARY PROBLEMS (pain or burning when urinating, or frequent urination) *BOWEL PROBLEMS (unusual diarrhea, constipation, pain near the anus) TENDERNESS IN MOUTH AND THROAT WITH OR WITHOUT PRESENCE OF ULCERS (sore throat, sores in mouth, or a toothache) UNUSUAL RASH, SWELLING OR PAIN  UNUSUAL VAGINAL DISCHARGE OR ITCHING   Items with * indicate a potential emergency and should be followed up as soon as possible or go to the Emergency Department if any problems should occur.  Please show the CHEMOTHERAPY ALERT CARD or IMMUNOTHERAPY ALERT CARD at check-in to the  Emergency Department and triage nurse.  Should you have questions after your visit or need to cancel or reschedule your appointment, please contact [facility]  Dept: [phone]  and follow the prompts.  Office hours are 8:00 a.m. to 4:30 p.m. Monday - Friday. Please note that voicemails left after 4:00 p.m. may not be returned until the following business day.  We are closed weekends and major holidays. You have access to a nurse at all times for urgent questions. Please call the main number to the clinic Dept: [phone] and follow the prompts.   For any non-urgent questions, you may also contact your provider using MyChart. We now offer e-Visits for anyone 71 and older to request care online for non-urgent symptoms. For details visit [email or web address].   Also download the MyChart app! Go to the app store, search "MyChart", open the app, select [facility], and log in with your MyChart username and password.  Masks are optional in the cancer centers. If you would like for your care team to wear a mask while they are taking care of you, please let them know. You may have one support person who is at least 73 years old accompany you for your appointments.  Nivolumab Injection What is this medication? NIVOLUMAB (nye VOL ue mab) treats some types of cancer. It works by helping your immune system slow or stop the spread of cancer cells. It is a monoclonal antibody. This medicine may be used for other purposes; ask your health care provider or pharmacist if you have questions. COMMON BRAND NAME(S): Opdivo What  should I tell my care team before I take this medication? They need to know if you have any of these conditions: Allogeneic stem cell transplant (uses someone else's stem cells) Autoimmune diseases, such as Crohn disease, ulcerative colitis, lupus History of chest radiation Nervous system problems, such as Guillain-Barre syndrome or myasthenia gravis Organ  transplant An unusual or allergic reaction to nivolumab, other medications, foods, dyes, or preservatives Pregnant or trying to get pregnant Breast-feeding How should I use this medication? This medication is infused into a vein. It is given in a hospital or clinic setting. A special MedGuide will be given to you before each treatment. Be sure to read this information carefully each time. Talk to your care team about the use of this medication in children. While it may be prescribed for children as young as 12 years for selected conditions, precautions do apply. Overdosage: If you think you have taken too much of this medicine contact a poison control center or emergency room at once. NOTE: This medicine is only for you. Do not share this medicine with others. What if I miss a dose? Keep appointments for follow-up doses. It is important not to miss your dose. Call your care team if you are unable to keep an appointment. What may interact with this medication? Interactions have not been studied. This list may not describe all possible interactions. Give your health care provider a list of all the medicines, herbs, non-prescription drugs, or dietary supplements you use. Also tell them if you smoke, drink alcohol, or use illegal drugs. Some items may interact with your medicine. What should I watch for while using this medication? Your condition will be monitored carefully while you are receiving this medication. You may need blood work while taking this medication. This medication may cause serious skin reactions. They can happen weeks to months after starting the medication. Contact your care team right away if you notice fevers or flu-like symptoms with a rash. The rash may be red or purple and then turn into blisters or peeling of the skin. You may also notice a red rash with swelling of the face, lips, or lymph nodes in your neck or under your arms. Tell your care team right away if you have any  change in your eyesight. Talk to your care team if you are pregnant or think you might be pregnant. A negative pregnancy test is required before starting this medication. A reliable form of contraception is recommended while taking this medication and for 5 months after the last dose. Talk to your care team about effective forms of contraception. Do not breast-feed while taking this medication and for 5 months after the last dose. What side effects may I notice from receiving this medication? Side effects that you should report to your care team as soon as possible: Allergic reactions--skin rash, itching, hives, swelling of the face, lips, tongue, or throat Dry cough, shortness of breath or trouble breathing Eye pain, redness, irritation, or discharge with blurry or decreased vision Heart muscle inflammation--unusual weakness or fatigue, shortness of breath, chest pain, fast or irregular heartbeat, dizziness, swelling of the ankles, feet, or hands Hormone gland problems--headache, sensitivity to light, unusual weakness or fatigue, dizziness, fast or irregular heartbeat, increased sensitivity to cold or heat, excessive sweating, constipation, hair loss, increased thirst or amount of urine, tremors or shaking, irritability Infusion reactions--chest pain, shortness of breath or trouble breathing, feeling faint or lightheaded Kidney injury (glomerulonephritis)--decrease in the amount of urine, red or dark  brown urine, foamy or bubbly urine, swelling of the ankles, hands, or feet Liver injury--right upper belly pain, loss of appetite, nausea, light-colored stool, dark yellow or brown urine, yellowing skin or eyes, unusual weakness or fatigue Pain, tingling, or numbness in the hands or feet, muscle weakness, change in vision, confusion or trouble speaking, loss of balance or coordination, trouble walking, seizures Rash, fever, and swollen lymph nodes Redness, blistering, peeling, or loosening of the skin,  including inside the mouth Sudden or severe stomach pain, bloody diarrhea, fever, nausea, vomiting Side effects that usually do not require medical attention (report these to your care team if they continue or are bothersome): Bone, joint, or muscle pain Diarrhea Fatigue Loss of appetite Nausea Skin rash This list may not describe all possible side effects. Call your doctor for medical advice about side effects. You may report side effects to FDA at 1-800-FDA-1088. Where should I keep my medication? This medication is given in a hospital or clinic. It will not be stored at home. NOTE: This sheet is a summary. It may not cover all possible information. If you have questions about this medicine, talk to your doctor, pharmacist, or health care provider.  2023 Elsevier/Gold Standard (2022-01-03 00:00:00)

## 2022-08-18 NOTE — Progress Notes (Signed)
Patient seen by Dr. Sherrill today ? ?Vitals are within treatment parameters. ? ?Labs reviewed by Dr. Sherrill and are within treatment parameters. ? ?Per physician team, patient is ready for treatment and there are NO modifications to the treatment plan.  ?

## 2022-08-19 LAB — T4: T4, Total: 8.8 ug/dL (ref 4.5–12.0)

## 2022-08-22 ENCOUNTER — Telehealth: Payer: Self-pay | Admitting: Internal Medicine

## 2022-08-22 NOTE — Telephone Encounter (Signed)
Patient seeking information on an accupuncturist in the area.

## 2022-08-23 NOTE — Telephone Encounter (Signed)
I do not know of any specific acupuncture providers. I suggest she do a Producer, television/film/video (see if any are covered by her insurance first)

## 2022-08-23 NOTE — Telephone Encounter (Signed)
Called and spoke with patient about message.

## 2022-09-15 ENCOUNTER — Inpatient Hospital Stay: Payer: Medicare HMO | Attending: Nurse Practitioner

## 2022-09-15 ENCOUNTER — Inpatient Hospital Stay (HOSPITAL_BASED_OUTPATIENT_CLINIC_OR_DEPARTMENT_OTHER): Payer: Medicare HMO | Admitting: Oncology

## 2022-09-15 ENCOUNTER — Inpatient Hospital Stay: Payer: Medicare HMO

## 2022-09-15 VITALS — BP 122/61 | HR 70

## 2022-09-15 VITALS — BP 128/69 | HR 75 | Temp 98.1°F | Resp 18 | Ht 62.0 in | Wt 166.0 lb

## 2022-09-15 DIAGNOSIS — C210 Malignant neoplasm of anus, unspecified: Secondary | ICD-10-CM | POA: Insufficient documentation

## 2022-09-15 DIAGNOSIS — C772 Secondary and unspecified malignant neoplasm of intra-abdominal lymph nodes: Secondary | ICD-10-CM | POA: Diagnosis not present

## 2022-09-15 DIAGNOSIS — Z79899 Other long term (current) drug therapy: Secondary | ICD-10-CM | POA: Diagnosis not present

## 2022-09-15 DIAGNOSIS — C778 Secondary and unspecified malignant neoplasm of lymph nodes of multiple regions: Secondary | ICD-10-CM | POA: Diagnosis not present

## 2022-09-15 DIAGNOSIS — Z5112 Encounter for antineoplastic immunotherapy: Secondary | ICD-10-CM | POA: Diagnosis not present

## 2022-09-15 LAB — CMP (CANCER CENTER ONLY)
ALT: 12 U/L (ref 0–44)
AST: 22 U/L (ref 15–41)
Albumin: 4 g/dL (ref 3.5–5.0)
Alkaline Phosphatase: 57 U/L (ref 38–126)
Anion gap: 9 (ref 5–15)
BUN: 24 mg/dL — ABNORMAL HIGH (ref 8–23)
CO2: 25 mmol/L (ref 22–32)
Calcium: 9.5 mg/dL (ref 8.9–10.3)
Chloride: 104 mmol/L (ref 98–111)
Creatinine: 0.92 mg/dL (ref 0.44–1.00)
GFR, Estimated: >60 mL/min (ref >60)
Glucose, Bld: 83 mg/dL (ref 70–99)
Potassium: 4.2 mmol/L (ref 3.5–5.1)
Sodium: 138 mmol/L (ref 135–145)
Total Bilirubin: 0.3 mg/dL (ref 0.3–1.2)
Total Protein: 7.2 g/dL (ref 6.5–8.1)

## 2022-09-15 LAB — CBC WITH DIFFERENTIAL (CANCER CENTER ONLY)
Abs Immature Granulocytes: 0.01 10*3/uL (ref 0.00–0.07)
Basophils Absolute: 0 10*3/uL (ref 0.0–0.1)
Basophils Relative: 1 %
Eosinophils Absolute: 0.2 10*3/uL (ref 0.0–0.5)
Eosinophils Relative: 4 %
HCT: 36.6 % (ref 36.0–46.0)
Hemoglobin: 11.6 g/dL — ABNORMAL LOW (ref 12.0–15.0)
Immature Granulocytes: 0 %
Lymphocytes Relative: 12 %
Lymphs Abs: 0.7 10*3/uL (ref 0.7–4.0)
MCH: 29.2 pg (ref 26.0–34.0)
MCHC: 31.7 g/dL (ref 30.0–36.0)
MCV: 92.2 fL (ref 80.0–100.0)
Monocytes Absolute: 0.4 10*3/uL (ref 0.1–1.0)
Monocytes Relative: 7 %
Neutro Abs: 4.2 10*3/uL (ref 1.7–7.7)
Neutrophils Relative %: 76 %
Platelet Count: 205 10*3/uL (ref 150–400)
RBC: 3.97 MIL/uL (ref 3.87–5.11)
RDW: 14.2 % (ref 11.5–15.5)
WBC Count: 5.5 10*3/uL (ref 4.0–10.5)
nRBC: 0 % (ref 0.0–0.2)

## 2022-09-15 LAB — TSH: TSH: 3.591 u[IU]/mL (ref 0.350–4.500)

## 2022-09-15 MED ORDER — SODIUM CHLORIDE 0.9 % IV SOLN: Freq: Once | INTRAVENOUS | Status: AC

## 2022-09-15 MED ORDER — SODIUM CHLORIDE 0.9 % IV SOLN
480.0000 mg | Freq: Once | INTRAVENOUS | Status: AC
  Administered 2022-09-15: 480 mg via INTRAVENOUS
  Filled 2022-09-15: qty 48

## 2022-09-15 NOTE — Progress Notes (Signed)
Patient seen by Dr. Sherrill today ? ?Vitals are within treatment parameters. ? ?Labs reviewed by Dr. Sherrill and are within treatment parameters. ? ?Per physician team, patient is ready for treatment and there are NO modifications to the treatment plan.  ?

## 2022-09-15 NOTE — Progress Notes (Signed)
[facility] OFFICE PROGRESS NOTE   Diagnosis: Anal cancer  INTERVAL HISTORY:   Ashley Moore completed another treatment with nivolumab on 08/18/2022.  No rash or diarrhea.  She feels well.  She is working.  No difficulty with IV access.  No new complaint.  Objective:  Vital signs in last 24 hours:  Blood pressure 128/69, pulse 75, temperature 98.1 F (36.7 C), temperature source Oral, resp. rate 18, height '5\' 2"'$  (1.575 m), weight 166 lb (75.3 kg), SpO2 100 %.    HEENT: Neck without mass Lymphatics: No cervical, supraclavicular, axillary, or inguinal nodes Resp: Lungs clear bilaterally Cardio: Regular rate and rhythm GI: No mass, no hepatosplenomegaly    Lab Results:  Lab Results  Component Value Date   WBC 5.5 09/15/2022   HGB 11.6 (L) 09/15/2022   HCT 36.6 09/15/2022   MCV 92.2 09/15/2022   PLT 205 09/15/2022   NEUTROABS 4.2 09/15/2022    CMP  Lab Results  Component Value Date   NA 138 09/15/2022   K 4.2 09/15/2022   CL 104 09/15/2022   CO2 25 09/15/2022   GLUCOSE 83 09/15/2022   BUN 24 (H) 09/15/2022   CREATININE 0.92 09/15/2022   CALCIUM 9.5 09/15/2022   PROT 7.2 09/15/2022   ALBUMIN 4.0 09/15/2022   AST 22 09/15/2022   ALT 12 09/15/2022   ALKPHOS 57 09/15/2022   BILITOT 0.3 09/15/2022   GFRNONAA >60 09/15/2022   GFRAA >60 06/17/2020    Lab Results  Component Value Date   CEA1 1.15 03/04/2021   CEA 1.16 03/04/2021    Lab Results  Component Value Date   INR 1.2 05/16/2021   LABPROT 15.2 05/16/2021    Imaging:  No results found.  Medications: I have reviewed the patient's current medications.   Assessment/Plan: Anal cancer CT abdomen/pelvis 10/21/2016-thickening of the anus extending to the junction between the anus and rectum with a large stool ball in the rectum and mild fat stranding posterior to the rectum. Enlarged lymph nodes in the right and left inguinal regions. A few mildly prominent nodes seen posterior to the  rectum. 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 PET scan 16/06/[id number] hypermetabolic anal mass with hypermetabolic metastases to the groin region bilaterally, left pelvic sidewall and presacral space. Initiation of radiation and cycle 1 5-FU/mitomycin C 11/14/2016 Cycle 2 5-FU/mitomycin C 12/12/2016 (5-FU dose reduced due to mucositis, diarrhea, skin breakdown) Radiation completed 12/23/2016 CT abdomen/pelvis 04/14/2017-resolution of anal mass and bilateral inguinal lymphadenopathy. No residual tumor seen. CT abdomen/pelvis 01/28/2020-right hydroureteronephrosis, transition at the level of the mid right ureter, retroperitoneal lymphadenopathy CT renal stone study 01/28/2020-severe right hydronephrosis and proximal right hydroureter, 8 mm area of increased attenuation in the proximal right ureter-stone versus soft tissue mass PET 02/13/2020-hypermetabolic right retroperitoneal nodal metastases in the aortocaval and posterior pericaval chains, nonspecific mild anal wall hypermetabolism, right nephroureteral stent 02/21/2020 anorectal exam per Dr. Ashley Jacobs radiation changes of the skin around the anal margin.  Posterior midline smooth scarring.  Mildly stenotic.  Stenosis seems better.  No palpable concerning lesions.  Entire anal canal and distal rectum feels smooth and healthy.  Partial anoscopy performed with no lesions in the anal canal. Xeloda/radiation beginning 03/02/2020, completed 04/13/2020 CT abdomen/pelvis 06/17/2020-resolution of right retroperitoneal lymphadenopathy, no remaining pathologically enlarged lymph nodes, residual mild right hydronephrosis, no evidence of progressive disease, stable anal wall thickening Digital rectal exam by Dr. Quentin Cornwall 08/26/2020-posterior midline smooth scarring.  Mildly stenotic.  Stenosis seems better.  No palpable concerning  lesions.  Anal canal feels smooth without palpable concern.  Unable to tolerate anoscopy.  Next visit in  6 months for surveillance. CT abdomen/pelvis 10/28/2020-no evidence of local recurrence or metastatic disease.  Stable mild rectal wall thickening.  New diffuse bladder wall thickening possibly related to prior radiation CT abdomen/pelvis 01/16/2021- recurrent right-sided hydronephrosis and proximal right hydroureter status post removal of right ureter stent, new soft tissue nodule along the course of the right ureter between the right psoas and IVC suspicious for recurrent lymphadenopathy PET 02/12/2021-small focus of hypermetabolism at the anorectal junction without a definable mass, right retroperitoneal nodal metastasis with obstruction of the proximal right ureter, right middle lobe hypermetabolic nodule, hypermetabolism in the right hilum without a defined nodal mass, hypermetabolic left supraclavicular and left axillary nodes 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 Cycle 1 Taxol/carboplatin 03/11/2021 Cycle 2 Taxol/carboplatin 03/31/2021, Fulphila Cycle 3 Taxol/carboplatin 04/21/2021, Fulphila CTs 05/11/2021-decrease size of FDG avid nodule in right middle lobe, right middle lobe subpleural nodule slightly enlarged, decreased in size of left axillary and subpectoral nodes, unchanged FDG avid right iliac nodes Cycle 4 Taxol/carboplatin 05/12/2021, G-CSF discontinued secondary to poor tolerance, Taxol and carboplatin dose reduced Cycle 5 Taxol/carboplatin 06/02/2021 Cycle 6 carboplatin 07/01/2021, Taxol held due to neuropathy Cycle 7 carboplatin 07/29/2021, Taxol held due to neuropathy CTs 08/16/2021-new liver lesion, increase in right psoas lymph node, unchanged right hydronephrosis, new mediastinal and right hilar lymphadenopathy, enlargement of bilateral pulmonary nodules and at least one new nodule, new subcarinal node Cycle 1 nivolumab 08/25/2021 Cycle 2 nivolumab 09/08/2021 Cycle 3 nivolumab 09/22/2021 Cycle 4 nivolumab 10/06/2021 Cycle 5  nivolumab 10/20/2021 Cycle 6 nivolumab 11/04/2021 CTs 11/12/2021-resolution of lung nodules, decreased size of right psoas node, no remaining mediastinal lymphadenopathy, resolution of right liver lesion Cycle 7 nivolumab 11/17/2021 Cycle 8 nivolumab 12/01/2021 Cycle 9 nivolumab 12/15/2021 Cycle 10 nivolumab 12/30/2021 Cycle 11 nivolumab 01/12/2022 Cycle 12 nivolumab 5/10/268-monthy dosing Cycle 13 nivolumab 02/23/2022 CT 03/16/2022-stable right psoas mass, no pulmonary nodules, persistent circumferential anal thickening, unchanged subcentimeter left liver lesion Cycle 14 nivolumab 03/24/2022 Cycle 15 nivolumab 04/21/2022 Cycle 16 nivolumab 05/19/2022 Cycle 17 nivolumab 06/16/2022 Cycle 18 nivolumab 07/14/2022 CTs 08/16/2022-stable ill-defined soft tissue in the right retroperitoneum, very mild intra and EXTR hepatic biliary duct dilation, common bile duct dilated to 11 mm in the porta hepatis Cycle 19 nivolumab 08/18/2022 Cycle 20 nivolumab 09/15/2022 Left labial lesions. Question direct extension from the anal cancer versus metastatic disease from anal cancer versus a separate malignant process. 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 Multiple family members with breast cancer. History of hypokalemia-likely secondary to decreased nutritional intake and diarrhea; potassium in normal range 01/16/2017. No longer taking a potassium supplement. Right hydroureteronephrosis on CT 01/28/2020, status post a cystoscopy/pyelogram 01/28/2020 confirming extrinsic compression of the right ureter, status post stent placement Right ureter stent exchange 06/12/2020 Right ureter stent removed 01/13/2021   8.  Admission 01/07/2021 with Klebsiella pneumoniae urosepsis/bacteremia  9.  Hospital admission 05/15/2021 with UTI/possible right pyelonephritis     Disposition: Ms. KMcelrathappears stable.  She is tolerating  the nivolumab well.  There is no clinical evidence for progression of the anal cancer.  She will complete another treatment with nivolumab today.  She will return for an office visit and nivolumab in 1 month.  We will plan for a restaging CT evaluation in approximately 5 months.  GBetsy Coder MD  09/15/2022  2:48 PM

## 2022-09-15 NOTE — Patient Instructions (Signed)
Ashley Moore   Discharge Instructions: Thank you for choosing [facility] to provide your oncology and hematology care.   If you have a lab appointment with the [facility], please go directly to the [facility] and check in at the registration area.   Wear comfortable clothing and clothing appropriate for easy access to any Portacath or PICC line.   We strive to give you quality time with your provider. You may need to reschedule your appointment if you arrive late (15 or more minutes).  Arriving late affects you and other patients whose appointments are after yours.  Also, if you miss three or more appointments without notifying the office, you may be dismissed from the clinic at the provider's discretion.      For prescription refill requests, have your pharmacy contact our office and allow 72 hours for refills to be completed.    Today you received the following chemotherapy and/or immunotherapy agents Opdivo.      To help prevent nausea and vomiting after your treatment, we encourage you to take your nausea medication as directed.  BELOW ARE SYMPTOMS THAT SHOULD BE REPORTED IMMEDIATELY: *FEVER GREATER THAN 100.4 F (38 C) OR HIGHER *CHILLS OR SWEATING *NAUSEA AND VOMITING THAT IS NOT CONTROLLED WITH YOUR NAUSEA MEDICATION *UNUSUAL SHORTNESS OF BREATH *UNUSUAL BRUISING OR BLEEDING *URINARY PROBLEMS (pain or burning when urinating, or frequent urination) *BOWEL PROBLEMS (unusual diarrhea, constipation, pain near the anus) TENDERNESS IN MOUTH AND THROAT WITH OR WITHOUT PRESENCE OF ULCERS (sore throat, sores in mouth, or a toothache) UNUSUAL RASH, SWELLING OR PAIN  UNUSUAL VAGINAL DISCHARGE OR ITCHING   Items with * indicate a potential emergency and should be followed up as soon as possible or go to the Emergency Department if any problems should occur.  Please show the CHEMOTHERAPY ALERT CARD or IMMUNOTHERAPY ALERT CARD at check-in to the  Emergency Department and triage nurse.  Should you have questions after your visit or need to cancel or reschedule your appointment, please contact [facility]  Dept: [phone]  and follow the prompts.  Office hours are 8:00 a.m. to 4:30 p.m. Monday - Friday. Please note that voicemails left after 4:00 p.m. may not be returned until the following business day.  We are closed weekends and major holidays. You have access to a nurse at all times for urgent questions. Please call the main number to the clinic Dept: [phone] and follow the prompts.   For any non-urgent questions, you may also contact your provider using MyChart. We now offer e-Visits for anyone 77 and older to request care online for non-urgent symptoms. For details visit [email or web address].   Also download the MyChart app! Go to the app store, search "MyChart", open the app, select [facility], and log in with your MyChart username and password.  Masks are optional in the cancer centers. If you would like for your care team to wear a mask while they are taking care of you, please let them know. You may have one support person who is at least 73 years old accompany you for your appointments.  Nivolumab Injection What is this medication? NIVOLUMAB (nye VOL ue mab) treats some types of cancer. It works by helping your immune system slow or stop the spread of cancer cells. It is a monoclonal antibody. This medicine may be used for other purposes; ask your health care provider or pharmacist if you have questions. COMMON BRAND NAME(S): Opdivo What  should I tell my care team before I take this medication? They need to know if you have any of these conditions: Allogeneic stem cell transplant (uses someone else's stem cells) Autoimmune diseases, such as Crohn disease, ulcerative colitis, lupus History of chest radiation Nervous system problems, such as Guillain-Barre syndrome or myasthenia gravis Organ  transplant An unusual or allergic reaction to nivolumab, other medications, foods, dyes, or preservatives Pregnant or trying to get pregnant Breast-feeding How should I use this medication? This medication is infused into a vein. It is given in a hospital or clinic setting. A special MedGuide will be given to you before each treatment. Be sure to read this information carefully each time. Talk to your care team about the use of this medication in children. While it may be prescribed for children as young as 12 years for selected conditions, precautions do apply. Overdosage: If you think you have taken too much of this medicine contact a poison control center or emergency room at once. NOTE: This medicine is only for you. Do not share this medicine with others. What if I miss a dose? Keep appointments for follow-up doses. It is important not to miss your dose. Call your care team if you are unable to keep an appointment. What may interact with this medication? Interactions have not been studied. This list may not describe all possible interactions. Give your health care provider a list of all the medicines, herbs, non-prescription drugs, or dietary supplements you use. Also tell them if you smoke, drink alcohol, or use illegal drugs. Some items may interact with your medicine. What should I watch for while using this medication? Your condition will be monitored carefully while you are receiving this medication. You may need blood work while taking this medication. This medication may cause serious skin reactions. They can happen weeks to months after starting the medication. Contact your care team right away if you notice fevers or flu-like symptoms with a rash. The rash may be red or purple and then turn into blisters or peeling of the skin. You may also notice a red rash with swelling of the face, lips, or lymph nodes in your neck or under your arms. Tell your care team right away if you have any  change in your eyesight. Talk to your care team if you are pregnant or think you might be pregnant. A negative pregnancy test is required before starting this medication. A reliable form of contraception is recommended while taking this medication and for 5 months after the last dose. Talk to your care team about effective forms of contraception. Do not breast-feed while taking this medication and for 5 months after the last dose. What side effects may I notice from receiving this medication? Side effects that you should report to your care team as soon as possible: Allergic reactions--skin rash, itching, hives, swelling of the face, lips, tongue, or throat Dry cough, shortness of breath or trouble breathing Eye pain, redness, irritation, or discharge with blurry or decreased vision Heart muscle inflammation--unusual weakness or fatigue, shortness of breath, chest pain, fast or irregular heartbeat, dizziness, swelling of the ankles, feet, or hands Hormone gland problems--headache, sensitivity to light, unusual weakness or fatigue, dizziness, fast or irregular heartbeat, increased sensitivity to cold or heat, excessive sweating, constipation, hair loss, increased thirst or amount of urine, tremors or shaking, irritability Infusion reactions--chest pain, shortness of breath or trouble breathing, feeling faint or lightheaded Kidney injury (glomerulonephritis)--decrease in the amount of urine, red or dark  brown urine, foamy or bubbly urine, swelling of the ankles, hands, or feet Liver injury--right upper belly pain, loss of appetite, nausea, light-colored stool, dark yellow or brown urine, yellowing skin or eyes, unusual weakness or fatigue Pain, tingling, or numbness in the hands or feet, muscle weakness, change in vision, confusion or trouble speaking, loss of balance or coordination, trouble walking, seizures Rash, fever, and swollen lymph nodes Redness, blistering, peeling, or loosening of the skin,  including inside the mouth Sudden or severe stomach pain, bloody diarrhea, fever, nausea, vomiting Side effects that usually do not require medical attention (report these to your care team if they continue or are bothersome): Bone, joint, or muscle pain Diarrhea Fatigue Loss of appetite Nausea Skin rash This list may not describe all possible side effects. Call your doctor for medical advice about side effects. You may report side effects to FDA at 1-800-FDA-1088. Where should I keep my medication? This medication is given in a hospital or clinic. It will not be stored at home. NOTE: This sheet is a summary. It may not cover all possible information. If you have questions about this medicine, talk to your doctor, pharmacist, or health care provider.  2023 Elsevier/Gold Standard (2022-01-03 00:00:00)

## 2022-09-16 LAB — T4: T4, Total: 8 ug/dL (ref 4.5–12.0)

## 2022-10-13 ENCOUNTER — Inpatient Hospital Stay: Payer: Medicare HMO

## 2022-10-13 ENCOUNTER — Inpatient Hospital Stay: Payer: Medicare HMO | Admitting: Oncology

## 2022-10-17 ENCOUNTER — Inpatient Hospital Stay: Payer: Medicare HMO | Admitting: Nurse Practitioner

## 2022-10-17 ENCOUNTER — Inpatient Hospital Stay: Payer: Medicare HMO | Attending: Nurse Practitioner

## 2022-10-17 ENCOUNTER — Encounter: Payer: Self-pay | Admitting: Nurse Practitioner

## 2022-10-17 VITALS — BP 116/70 | HR 84 | Temp 98.1°F | Resp 18 | Ht 62.0 in | Wt 164.8 lb

## 2022-10-17 DIAGNOSIS — Z79899 Other long term (current) drug therapy: Secondary | ICD-10-CM | POA: Insufficient documentation

## 2022-10-17 DIAGNOSIS — C210 Malignant neoplasm of anus, unspecified: Secondary | ICD-10-CM

## 2022-10-17 DIAGNOSIS — C772 Secondary and unspecified malignant neoplasm of intra-abdominal lymph nodes: Secondary | ICD-10-CM

## 2022-10-17 DIAGNOSIS — Z5112 Encounter for antineoplastic immunotherapy: Secondary | ICD-10-CM | POA: Insufficient documentation

## 2022-10-17 LAB — CBC WITH DIFFERENTIAL (CANCER CENTER ONLY)
Abs Immature Granulocytes: 0.01 10*3/uL (ref 0.00–0.07)
Basophils Absolute: 0 10*3/uL (ref 0.0–0.1)
Basophils Relative: 0 %
Eosinophils Absolute: 0.1 10*3/uL (ref 0.0–0.5)
Eosinophils Relative: 3 %
HCT: 38.3 % (ref 36.0–46.0)
Hemoglobin: 12.2 g/dL (ref 12.0–15.0)
Immature Granulocytes: 0 %
Lymphocytes Relative: 14 %
Lymphs Abs: 0.7 10*3/uL (ref 0.7–4.0)
MCH: 29 pg (ref 26.0–34.0)
MCHC: 31.9 g/dL (ref 30.0–36.0)
MCV: 91 fL (ref 80.0–100.0)
Monocytes Absolute: 0.3 10*3/uL (ref 0.1–1.0)
Monocytes Relative: 6 %
Neutro Abs: 3.7 10*3/uL (ref 1.7–7.7)
Neutrophils Relative %: 77 %
Platelet Count: 201 10*3/uL (ref 150–400)
RBC: 4.21 MIL/uL (ref 3.87–5.11)
RDW: 14 % (ref 11.5–15.5)
WBC Count: 4.9 10*3/uL (ref 4.0–10.5)
nRBC: 0 % (ref 0.0–0.2)

## 2022-10-17 LAB — CMP (CANCER CENTER ONLY)
ALT: 12 U/L (ref 0–44)
AST: 24 U/L (ref 15–41)
Albumin: 4.1 g/dL (ref 3.5–5.0)
Alkaline Phosphatase: 66 U/L (ref 38–126)
Anion gap: 8 (ref 5–15)
BUN: 30 mg/dL — ABNORMAL HIGH (ref 8–23)
CO2: 23 mmol/L (ref 22–32)
Calcium: 10 mg/dL (ref 8.9–10.3)
Chloride: 103 mmol/L (ref 98–111)
Creatinine: 0.83 mg/dL (ref 0.44–1.00)
GFR, Estimated: >60 mL/min (ref >60)
Glucose, Bld: 77 mg/dL (ref 70–99)
Potassium: 4.3 mmol/L (ref 3.5–5.1)
Sodium: 134 mmol/L — ABNORMAL LOW (ref 135–145)
Total Bilirubin: 0.3 mg/dL (ref 0.3–1.2)
Total Protein: 7.6 g/dL (ref 6.5–8.1)

## 2022-10-17 LAB — TSH: TSH: 3.033 u[IU]/mL (ref 0.350–4.500)

## 2022-10-17 MED ORDER — LORAZEPAM 0.5 MG PO TABS: 0.5000 mg | ORAL_TABLET | Freq: Three times a day (TID) | ORAL | 0 refills | Status: DC | PRN

## 2022-10-17 NOTE — Progress Notes (Signed)
[facility] OFFICE PROGRESS NOTE   Diagnosis: Anal cancer  INTERVAL HISTORY:   Ashley Moore returns as scheduled.  She completed another cycle of nivolumab 09/15/2022.  She reports feeling "good".  No rash.  No diarrhea.  She denies pain.  She has a good appetite.  No nausea or vomiting.  No significant edema.  Objective:  Vital signs in last 24 hours:  Blood pressure 116/70, pulse 84, temperature 98.1 F (36.7 C), temperature source Oral, resp. rate 18, height '5\' 2"'$  (1.575 m), weight 164 lb 12.8 oz (74.8 kg), SpO2 100 %.    HEENT: No thrush or ulcers. Resp: Lungs clear bilaterally. Cardio: Regular rate and rhythm. GI: Abdomen soft and nontender.  No hepatosplenomegaly. Vascular: No leg edema. Neuro: Alert and oriented. Skin: No rash.   Lab Results:  Lab Results  Component Value Date   WBC 4.9 10/17/2022   HGB 12.2 10/17/2022   HCT 38.3 10/17/2022   MCV 91.0 10/17/2022   PLT 201 10/17/2022   NEUTROABS 3.7 10/17/2022    Imaging:  No results found.  Medications: I have reviewed the patient's current medications.  Assessment/Plan: Anal cancer CT abdomen/pelvis 10/21/2016-thickening of the anus extending to the junction between the anus and rectum with a large stool ball in the rectum and mild fat stranding posterior to the rectum. Enlarged lymph nodes in the right and left inguinal regions. A few mildly prominent nodes seen posterior to the rectum. 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 PET scan [id number] hypermetabolic anal mass with hypermetabolic metastases to the groin region bilaterally, left pelvic sidewall and presacral space. Initiation of radiation and cycle 1 5-FU/mitomycin C 11/14/2016 Cycle 2 5-FU/mitomycin C 12/12/2016 (5-FU dose reduced due to mucositis, diarrhea, skin breakdown) Radiation completed 12/23/2016 CT abdomen/pelvis 04/14/2017-resolution of anal mass and bilateral inguinal lymphadenopathy.  No residual tumor seen. CT abdomen/pelvis 01/28/2020-right hydroureteronephrosis, transition at the level of the mid right ureter, retroperitoneal lymphadenopathy CT renal stone study 01/28/2020-severe right hydronephrosis and proximal right hydroureter, 8 mm area of increased attenuation in the proximal right ureter-stone versus soft tissue mass PET 02/13/2020-hypermetabolic right retroperitoneal nodal metastases in the aortocaval and posterior pericaval chains, nonspecific mild anal wall hypermetabolism, right nephroureteral stent 02/21/2020 anorectal exam per Dr. Ashley Jacobs radiation changes of the skin around the anal margin.  Posterior midline smooth scarring.  Mildly stenotic.  Stenosis seems better.  No palpable concerning lesions.  Entire anal canal and distal rectum feels smooth and healthy.  Partial anoscopy performed with no lesions in the anal canal. Xeloda/radiation beginning 03/02/2020, completed 04/13/2020 CT abdomen/pelvis 06/17/2020-resolution of right retroperitoneal lymphadenopathy, no remaining pathologically enlarged lymph nodes, residual mild right hydronephrosis, no evidence of progressive disease, stable anal wall thickening Digital rectal exam by Dr. Quentin Cornwall 08/26/2020-posterior midline smooth scarring.  Mildly stenotic.  Stenosis seems better.  No palpable concerning lesions.  Anal canal feels smooth without palpable concern.  Unable to tolerate anoscopy.  Next visit in 6 months for surveillance. CT abdomen/pelvis 10/28/2020-no evidence of local recurrence or metastatic disease.  Stable mild rectal wall thickening.  New diffuse bladder wall thickening possibly related to prior radiation CT abdomen/pelvis 01/16/2021- recurrent right-sided hydronephrosis and proximal right hydroureter status post removal of right ureter stent, new soft tissue nodule along the course of the right ureter between the right psoas and IVC suspicious for recurrent lymphadenopathy PET 02/12/2021-small focus of  hypermetabolism at the anorectal junction without a definable mass, right retroperitoneal nodal metastasis with obstruction of the proximal right ureter, right  middle lobe hypermetabolic nodule, hypermetabolism in the right hilum without a defined nodal mass, hypermetabolic left supraclavicular and left axillary nodes 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 Cycle 1 Taxol/carboplatin 03/11/2021 Cycle 2 Taxol/carboplatin 03/31/2021, Fulphila Cycle 3 Taxol/carboplatin 04/21/2021, Fulphila CTs 05/11/2021-decrease size of FDG avid nodule in right middle lobe, right middle lobe subpleural nodule slightly enlarged, decreased in size of left axillary and subpectoral nodes, unchanged FDG avid right iliac nodes Cycle 4 Taxol/carboplatin 05/12/2021, G-CSF discontinued secondary to poor tolerance, Taxol and carboplatin dose reduced Cycle 5 Taxol/carboplatin 06/02/2021 Cycle 6 carboplatin 07/01/2021, Taxol held due to neuropathy Cycle 7 carboplatin 07/29/2021, Taxol held due to neuropathy CTs 08/16/2021-new liver lesion, increase in right psoas lymph node, unchanged right hydronephrosis, new mediastinal and right hilar lymphadenopathy, enlargement of bilateral pulmonary nodules and at least one new nodule, new subcarinal node Cycle 1 nivolumab 08/25/2021 Cycle 2 nivolumab 09/08/2021 Cycle 3 nivolumab 09/22/2021 Cycle 4 nivolumab 10/06/2021 Cycle 5 nivolumab 10/20/2021 Cycle 6 nivolumab 11/04/2021 CTs 11/12/2021-resolution of lung nodules, decreased size of right psoas node, no remaining mediastinal lymphadenopathy, resolution of right liver lesion Cycle 7 nivolumab 11/17/2021 Cycle 8 nivolumab 12/01/2021 Cycle 9 nivolumab 12/15/2021 Cycle 10 nivolumab 12/30/2021 Cycle 11 nivolumab 01/12/2022 Cycle 12 nivolumab 5/10/232-monthy dosing Cycle 13 nivolumab 02/23/2022 CT 03/16/2022-stable right psoas mass, no pulmonary nodules, persistent circumferential anal thickening,  unchanged subcentimeter left liver lesion Cycle 14 nivolumab 03/24/2022 Cycle 15 nivolumab 04/21/2022 Cycle 16 nivolumab 05/19/2022 Cycle 17 nivolumab 06/16/2022 Cycle 18 nivolumab 07/14/2022 CTs 08/16/2022-stable ill-defined soft tissue in the right retroperitoneum, very mild intra and EXTR hepatic biliary duct dilation, common bile duct dilated to 11 mm in the porta hepatis Cycle 19 nivolumab 08/18/2022 Cycle 20 nivolumab 09/15/2022 Cycle 21 nivolumab 10/18/2022 Left labial lesions. Question direct extension from the anal cancer versus metastatic disease from anal cancer versus a separate malignant process. 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 Multiple family members with breast cancer. History of hypokalemia-likely secondary to decreased nutritional intake and diarrhea; potassium in normal range 01/16/2017. No longer taking a potassium supplement. Right hydroureteronephrosis on CT 01/28/2020, status post a cystoscopy/pyelogram 01/28/2020 confirming extrinsic compression of the right ureter, status post stent placement Right ureter stent exchange 06/12/2020 Right ureter stent removed 01/13/2021   8.  Admission 01/07/2021 with Klebsiella pneumoniae urosepsis/bacteremia  9.  Hospital admission 05/15/2021 with UTI/possible right pyelonephritis    Disposition: Ashley Moore.  She continues nivolumab every 4 weeks.  She is tolerating Moore.  There is no clinical evidence of disease progression.  She is scheduled for the next cycle tomorrow, 10/18/2022.  Plan for restaging CT evaluation in approximately 4 months.  CBC and chemistry panel reviewed.  Labs adequate to proceed with nivolumab as above.  She will return for lab, follow-up, nivolumab in 4 weeks.  We are available to see her sooner if needed.    LNed CardANP/GNP-BC   10/17/2022  1:47 PM

## 2022-10-18 ENCOUNTER — Other Ambulatory Visit: Payer: Self-pay

## 2022-10-18 ENCOUNTER — Inpatient Hospital Stay: Payer: Medicare HMO

## 2022-10-18 VITALS — BP 115/63 | HR 62 | Temp 97.8°F | Resp 18

## 2022-10-18 DIAGNOSIS — H04123 Dry eye syndrome of bilateral lacrimal glands: Secondary | ICD-10-CM | POA: Diagnosis not present

## 2022-10-18 DIAGNOSIS — H02883 Meibomian gland dysfunction of right eye, unspecified eyelid: Secondary | ICD-10-CM | POA: Diagnosis not present

## 2022-10-18 DIAGNOSIS — C772 Secondary and unspecified malignant neoplasm of intra-abdominal lymph nodes: Secondary | ICD-10-CM

## 2022-10-18 DIAGNOSIS — Z5112 Encounter for antineoplastic immunotherapy: Secondary | ICD-10-CM | POA: Diagnosis not present

## 2022-10-18 DIAGNOSIS — Z79899 Other long term (current) drug therapy: Secondary | ICD-10-CM | POA: Diagnosis not present

## 2022-10-18 DIAGNOSIS — H02886 Meibomian gland dysfunction of left eye, unspecified eyelid: Secondary | ICD-10-CM | POA: Diagnosis not present

## 2022-10-18 DIAGNOSIS — C210 Malignant neoplasm of anus, unspecified: Secondary | ICD-10-CM | POA: Diagnosis not present

## 2022-10-18 LAB — T4: T4, Total: 7.8 ug/dL (ref 4.5–12.0)

## 2022-10-18 MED ORDER — SODIUM CHLORIDE 0.9 % IV SOLN
480.0000 mg | Freq: Once | INTRAVENOUS | Status: AC
  Administered 2022-10-18: 480 mg via INTRAVENOUS
  Filled 2022-10-18: qty 48

## 2022-10-18 MED ORDER — SODIUM CHLORIDE 0.9 % IV SOLN: Freq: Once | INTRAVENOUS | Status: AC

## 2022-10-18 NOTE — Patient Instructions (Signed)
[facility]   Discharge Instructions: Thank you for choosing [facility] to provide your oncology and hematology care.   If you have a lab appointment with the [facility], please go directly to the [facility] and check in at the registration area.   Wear comfortable clothing and clothing appropriate for easy access to any Portacath or PICC line.   We strive to give you quality time with your provider. You may need to reschedule your appointment if you arrive late (15 or more minutes).  Arriving late affects you and other patients whose appointments are after yours.  Also, if you miss three or more appointments without notifying the office, you may be dismissed from the clinic at the provider's discretion.      For prescription refill requests, have your pharmacy contact our office and allow 72 hours for refills to be completed.    Today you received the following chemotherapy and/or immunotherapy agents Nivolumab (OPDIVO).      To help prevent nausea and vomiting after your treatment, we encourage you to take your nausea medication as directed.  BELOW ARE SYMPTOMS THAT SHOULD BE REPORTED IMMEDIATELY: *FEVER GREATER THAN 100.4 F (38 C) OR HIGHER *CHILLS OR SWEATING *NAUSEA AND VOMITING THAT IS NOT CONTROLLED WITH YOUR NAUSEA MEDICATION *UNUSUAL SHORTNESS OF BREATH *UNUSUAL BRUISING OR BLEEDING *URINARY PROBLEMS (pain or burning when urinating, or frequent urination) *BOWEL PROBLEMS (unusual diarrhea, constipation, pain near the anus) TENDERNESS IN MOUTH AND THROAT WITH OR WITHOUT PRESENCE OF ULCERS (sore throat, sores in mouth, or a toothache) UNUSUAL RASH, SWELLING OR PAIN  UNUSUAL VAGINAL DISCHARGE OR ITCHING   Items with * indicate a potential emergency and should be followed up as soon as possible or go to the Emergency Department if any problems should occur.  Please show the CHEMOTHERAPY ALERT CARD or IMMUNOTHERAPY ALERT CARD  at check-in to the Emergency Department and triage nurse.  Should you have questions after your visit or need to cancel or reschedule your appointment, please contact [facility]  Dept: [phone]  and follow the prompts.  Office hours are 8:00 a.m. to 4:30 p.m. Monday - Friday. Please note that voicemails left after 4:00 p.m. may not be returned until the following business day.  We are closed weekends and major holidays. You have access to a nurse at all times for urgent questions. Please call the main number to the clinic Dept: [phone] and follow the prompts.   For any non-urgent questions, you may also contact your provider using MyChart. We now offer e-Visits for anyone 28 and older to request care online for non-urgent symptoms. For details visit [email or web address].   Also download the MyChart app! Go to the app store, search "MyChart", open the app, select [address], and log in with your MyChart username and password.  Nivolumab Injection What is this medication? NIVOLUMAB (nye VOL ue mab) treats some types of cancer. It works by helping your immune system slow or stop the spread of cancer cells. It is a monoclonal antibody. This medicine may be used for other purposes; ask your health care provider or pharmacist if you have questions. COMMON BRAND NAME(S): Opdivo What should I tell my care team before I take this medication? They need to know if you have any of these conditions: Allogeneic stem cell transplant (uses someone else's stem cells) Autoimmune diseases, such as Crohn disease, ulcerative colitis, lupus History of chest radiation Nervous system  as Guillain-Barre syndrome or myasthenia gravis Organ transplant An unusual or allergic reaction to nivolumab, other medications, foods, dyes, or preservatives Pregnant or trying to get pregnant Breast-feeding How should I use this medication? This medication is infused  into a vein. It is given in a hospital or clinic setting. A special MedGuide will be given to you before each treatment. Be sure to read this information carefully each time. Talk to your care team about the use of this medication in children. While it may be prescribed for children as young as 12 years for selected conditions, precautions do apply. Overdosage: If you think you have taken too much of this medicine contact a poison control center or emergency room at once. NOTE: This medicine is only for you. Do not share this medicine with others. What if I miss a dose? Keep appointments for follow-up doses. It is important not to miss your dose. Call your care team if you are unable to keep an appointment. What may interact with this medication? Interactions have not been studied. This list may not describe all possible interactions. Give your health care provider a list of all the medicines, herbs, non-prescription drugs, or dietary supplements you use. Also tell them if you smoke, drink alcohol, or use illegal drugs. Some items may interact with your medicine. What should I watch for while using this medication? Your condition will be monitored carefully while you are receiving this medication. You may need blood work while taking this medication. This medication may cause serious skin reactions. They can happen weeks to months after starting the medication. Contact your care team right away if you notice fevers or flu-like symptoms with a rash. The rash may be red or purple and then turn into blisters or peeling of the skin. You may also notice a red rash with swelling of the face, lips, or lymph nodes in your neck or under your arms. Tell your care team right away if you have any change in your eyesight. Talk to your care team if you are pregnant or think you might be pregnant. A negative pregnancy test is required before starting this medication. A reliable form of contraception is recommended  while taking this medication and for 5 months after the last dose. Talk to your care team about effective forms of contraception. Do not breast-feed while taking this medication and for 5 months after the last dose. What side effects may I notice from receiving this medication? Side effects that you should report to your care team as soon as possible: Allergic reactions--skin rash, itching, hives, swelling of the face, lips, tongue, or throat Dry cough, shortness of breath or trouble breathing Eye pain, redness, irritation, or discharge with blurry or decreased vision Heart muscle inflammation--unusual weakness or fatigue, shortness of breath, chest pain, fast or irregular heartbeat, dizziness, swelling of the ankles, feet, or hands Hormone gland problems--headache, sensitivity to light, unusual weakness or fatigue, dizziness, fast or irregular heartbeat, increased sensitivity to cold or heat, excessive sweating, constipation, hair loss, increased thirst or amount of urine, tremors or shaking, irritability Infusion reactions--chest pain, shortness of breath or trouble breathing, feeling faint or lightheaded Kidney injury (glomerulonephritis)--decrease in the amount of urine, red or dark brown urine, foamy or bubbly urine, swelling of the ankles, hands, or feet Liver injury--right upper belly pain, loss of appetite, nausea, light-colored stool, dark yellow or brown urine, yellowing skin or eyes, unusual weakness or fatigue Pain, tingling, or numbness in the hands or feet, muscle weakness,   change in vision, confusion or trouble speaking, loss of balance or coordination, trouble walking, seizures Rash, fever, and swollen lymph nodes Redness, blistering, peeling, or loosening of the skin, including inside the mouth Sudden or severe stomach pain, bloody diarrhea, fever, nausea, vomiting Side effects that usually do not require medical attention (report these to your care team if they continue or are  bothersome): Bone, joint, or muscle pain Diarrhea Fatigue Loss of appetite Nausea Skin rash This list may not describe all possible side effects. Call your doctor for medical advice about side effects. You may report side effects to FDA at 1-800-FDA-1088. Where should I keep my medication? This medication is given in a hospital or clinic. It will not be stored at home. NOTE: This sheet is a summary. It may not cover all possible information. If you have questions about this medicine, talk to your doctor, pharmacist, or health care provider.  2023 Elsevier/Gold Standard (2022-01-03 00:00:00)    

## 2022-10-19 ENCOUNTER — Other Ambulatory Visit: Payer: Self-pay | Admitting: Internal Medicine

## 2022-11-10 ENCOUNTER — Ambulatory Visit: Payer: Medicare HMO

## 2022-11-10 ENCOUNTER — Ambulatory Visit: Payer: Medicare HMO | Admitting: Oncology

## 2022-11-10 ENCOUNTER — Other Ambulatory Visit: Payer: Medicare HMO

## 2022-11-13 ENCOUNTER — Other Ambulatory Visit: Payer: Self-pay | Admitting: Oncology

## 2022-11-15 ENCOUNTER — Encounter: Payer: Self-pay | Admitting: Nurse Practitioner

## 2022-11-15 ENCOUNTER — Inpatient Hospital Stay: Payer: Medicare HMO | Admitting: Nurse Practitioner

## 2022-11-15 ENCOUNTER — Inpatient Hospital Stay: Payer: Medicare HMO | Attending: Nurse Practitioner

## 2022-11-15 ENCOUNTER — Inpatient Hospital Stay: Payer: Medicare HMO

## 2022-11-15 VITALS — BP 130/73 | HR 72 | Temp 98.1°F | Resp 17 | Ht 62.0 in | Wt 169.0 lb

## 2022-11-15 VITALS — BP 140/66 | HR 66

## 2022-11-15 DIAGNOSIS — R5383 Other fatigue: Secondary | ICD-10-CM

## 2022-11-15 DIAGNOSIS — C210 Malignant neoplasm of anus, unspecified: Secondary | ICD-10-CM | POA: Insufficient documentation

## 2022-11-15 DIAGNOSIS — C772 Secondary and unspecified malignant neoplasm of intra-abdominal lymph nodes: Secondary | ICD-10-CM

## 2022-11-15 DIAGNOSIS — C7989 Secondary malignant neoplasm of other specified sites: Secondary | ICD-10-CM | POA: Diagnosis not present

## 2022-11-15 DIAGNOSIS — Z5112 Encounter for antineoplastic immunotherapy: Secondary | ICD-10-CM | POA: Diagnosis not present

## 2022-11-15 LAB — CMP (CANCER CENTER ONLY)
ALT: 15 U/L (ref 0–44)
AST: 27 U/L (ref 15–41)
Albumin: 4.2 g/dL (ref 3.5–5.0)
Alkaline Phosphatase: 78 U/L (ref 38–126)
Anion gap: 8 (ref 5–15)
BUN: 24 mg/dL — ABNORMAL HIGH (ref 8–23)
CO2: 24 mmol/L (ref 22–32)
Calcium: 9.6 mg/dL (ref 8.9–10.3)
Chloride: 101 mmol/L (ref 98–111)
Creatinine: 0.87 mg/dL (ref 0.44–1.00)
GFR, Estimated: >60 mL/min (ref >60)
Glucose, Bld: 75 mg/dL (ref 70–99)
Potassium: 4.6 mmol/L (ref 3.5–5.1)
Sodium: 133 mmol/L — ABNORMAL LOW (ref 135–145)
Total Bilirubin: 0.4 mg/dL (ref 0.3–1.2)
Total Protein: 7.3 g/dL (ref 6.5–8.1)

## 2022-11-15 LAB — CBC WITH DIFFERENTIAL (CANCER CENTER ONLY)
Abs Immature Granulocytes: 0.01 10*3/uL (ref 0.00–0.07)
Basophils Absolute: 0 10*3/uL (ref 0.0–0.1)
Basophils Relative: 1 %
Eosinophils Absolute: 0.2 10*3/uL (ref 0.0–0.5)
Eosinophils Relative: 5 %
HCT: 37 % (ref 36.0–46.0)
Hemoglobin: 11.8 g/dL — ABNORMAL LOW (ref 12.0–15.0)
Immature Granulocytes: 0 %
Lymphocytes Relative: 14 %
Lymphs Abs: 0.7 10*3/uL (ref 0.7–4.0)
MCH: 28.9 pg (ref 26.0–34.0)
MCHC: 31.9 g/dL (ref 30.0–36.0)
MCV: 90.5 fL (ref 80.0–100.0)
Monocytes Absolute: 0.4 10*3/uL (ref 0.1–1.0)
Monocytes Relative: 9 %
Neutro Abs: 3.5 10*3/uL (ref 1.7–7.7)
Neutrophils Relative %: 71 %
Platelet Count: 211 10*3/uL (ref 150–400)
RBC: 4.09 MIL/uL (ref 3.87–5.11)
RDW: 14.2 % (ref 11.5–15.5)
WBC Count: 4.8 10*3/uL (ref 4.0–10.5)
nRBC: 0 % (ref 0.0–0.2)

## 2022-11-15 LAB — TSH: TSH: 3.772 u[IU]/mL (ref 0.350–4.500)

## 2022-11-15 LAB — CORTISOL: Cortisol, Plasma: 7 ug/dL

## 2022-11-15 MED ORDER — SODIUM CHLORIDE 0.9 % IV SOLN
480.0000 mg | Freq: Once | INTRAVENOUS | Status: AC
  Administered 2022-11-15: 480 mg via INTRAVENOUS
  Filled 2022-11-15: qty 48

## 2022-11-15 MED ORDER — SODIUM CHLORIDE 0.9 % IV SOLN: Freq: Once | INTRAVENOUS | Status: AC

## 2022-11-15 NOTE — Progress Notes (Signed)
Patient seen by Lisa Thomas NP today  Vitals are within treatment parameters.  Labs reviewed by Lisa Thomas NP and are within treatment parameters.  Per physician team, patient is ready for treatment and there are NO modifications to the treatment plan.  

## 2022-11-15 NOTE — Progress Notes (Signed)
[facility] OFFICE PROGRESS NOTE   Diagnosis: Anal cancer  INTERVAL HISTORY:   Ashley Moore returns as scheduled.  She completed another cycle of nivolumab 10/18/2022.  No rash or diarrhea.  No nausea or vomiting.  She has a good appetite.  No shortness of breath.  She has an occasional cough.  No new areas of pain.  Main complaint is being more fatigued over the past month.  She is able to complete normal activities.  Objective:  Vital signs in last 24 hours:  Blood pressure 130/73, pulse 72, temperature 98.1 F (36.7 C), temperature source Oral, resp. rate 17, height '5\' 2"'$  (1.575 m), weight 169 lb (76.7 kg), SpO2 99 %.    HEENT: No thrush or ulcers. Lymphatics: No palpable cervical, supraclavicular or axillary lymph nodes. Resp: Lungs clear bilaterally. Cardio: Regular rate and rhythm. GI: Abdomen soft and nontender.  No hepatosplenomegaly. Vascular: Trace edema bilateral ankle. Neuro: Alert and oriented. Skin: No rash.   Lab Results:  Lab Results  Component Value Date   WBC 4.8 11/15/2022   HGB 11.8 (L) 11/15/2022   HCT 37.0 11/15/2022   MCV 90.5 11/15/2022   PLT 211 11/15/2022   NEUTROABS 3.5 11/15/2022    Imaging:  No results found.  Medications: I have reviewed the patient's current medications.  Assessment/Plan: Anal cancer CT abdomen/pelvis 10/21/2016-thickening of the anus extending to the junction between the anus and rectum with a large stool ball in the rectum and mild fat stranding posterior to the rectum. Enlarged lymph nodes in the right and left inguinal regions. A few mildly prominent nodes seen posterior to the rectum. 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 PET scan [id number] hypermetabolic anal mass with hypermetabolic metastases to the groin region bilaterally, left pelvic sidewall and presacral space. Initiation of radiation and cycle 1 5-FU/mitomycin C 11/14/2016 Cycle 2 5-FU/mitomycin C  12/12/2016 (5-FU dose reduced due to mucositis, diarrhea, skin breakdown) Radiation completed 12/23/2016 CT abdomen/pelvis 04/14/2017-resolution of anal mass and bilateral inguinal lymphadenopathy. No residual tumor seen. CT abdomen/pelvis 01/28/2020-right hydroureteronephrosis, transition at the level of the mid right ureter, retroperitoneal lymphadenopathy CT renal stone study 01/28/2020-severe right hydronephrosis and proximal right hydroureter, 8 mm area of increased attenuation in the proximal right ureter-stone versus soft tissue mass PET 02/13/2020-hypermetabolic right retroperitoneal nodal metastases in the aortocaval and posterior pericaval chains, nonspecific mild anal wall hypermetabolism, right nephroureteral stent 02/21/2020 anorectal exam per Dr. Ashley Jacobs radiation changes of the skin around the anal margin.  Posterior midline smooth scarring.  Mildly stenotic.  Stenosis seems better.  No palpable concerning lesions.  Entire anal canal and distal rectum feels smooth and healthy.  Partial anoscopy performed with no lesions in the anal canal. Xeloda/radiation beginning 03/02/2020, completed 04/13/2020 CT abdomen/pelvis 06/17/2020-resolution of right retroperitoneal lymphadenopathy, no remaining pathologically enlarged lymph nodes, residual mild right hydronephrosis, no evidence of progressive disease, stable anal wall thickening Digital rectal exam by Dr. Quentin Cornwall 08/26/2020-posterior midline smooth scarring.  Mildly stenotic.  Stenosis seems better.  No palpable concerning lesions.  Anal canal feels smooth without palpable concern.  Unable to tolerate anoscopy.  Next visit in 6 months for surveillance. CT abdomen/pelvis 10/28/2020-no evidence of local recurrence or metastatic disease.  Stable mild rectal wall thickening.  New diffuse bladder wall thickening possibly related to prior radiation CT abdomen/pelvis 01/16/2021- recurrent right-sided hydronephrosis and proximal right hydroureter status  post removal of right ureter stent, new soft tissue nodule along the course of the right ureter between the right psoas  and IVC suspicious for recurrent lymphadenopathy PET 02/12/2021-small focus of hypermetabolism at the anorectal junction without a definable mass, right retroperitoneal nodal metastasis with obstruction of the proximal right ureter, right middle lobe hypermetabolic nodule, hypermetabolism in the right hilum without a defined nodal mass, hypermetabolic left supraclavicular and left axillary nodes 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 Cycle 1 Taxol/carboplatin 03/11/2021 Cycle 2 Taxol/carboplatin 03/31/2021, Fulphila Cycle 3 Taxol/carboplatin 04/21/2021, Fulphila CTs 05/11/2021-decrease size of FDG avid nodule in right middle lobe, right middle lobe subpleural nodule slightly enlarged, decreased in size of left axillary and subpectoral nodes, unchanged FDG avid right iliac nodes Cycle 4 Taxol/carboplatin 05/12/2021, G-CSF discontinued secondary to poor tolerance, Taxol and carboplatin dose reduced Cycle 5 Taxol/carboplatin 06/02/2021 Cycle 6 carboplatin 07/01/2021, Taxol held due to neuropathy Cycle 7 carboplatin 07/29/2021, Taxol held due to neuropathy CTs 08/16/2021-new liver lesion, increase in right psoas lymph node, unchanged right hydronephrosis, new mediastinal and right hilar lymphadenopathy, enlargement of bilateral pulmonary nodules and at least one new nodule, new subcarinal node Cycle 1 nivolumab 08/25/2021 Cycle 2 nivolumab 09/08/2021 Cycle 3 nivolumab 09/22/2021 Cycle 4 nivolumab 10/06/2021 Cycle 5 nivolumab 10/20/2021 Cycle 6 nivolumab 11/04/2021 CTs 11/12/2021-resolution of lung nodules, decreased size of right psoas node, no remaining mediastinal lymphadenopathy, resolution of right liver lesion Cycle 7 nivolumab 11/17/2021 Cycle 8 nivolumab 12/01/2021 Cycle 9 nivolumab 12/15/2021 Cycle 10 nivolumab 12/30/2021 Cycle 11  nivolumab 01/12/2022 Cycle 12 nivolumab 5/10/270-monthy dosing Cycle 13 nivolumab 02/23/2022 CT 03/16/2022-stable right psoas mass, no pulmonary nodules, persistent circumferential anal thickening, unchanged subcentimeter left liver lesion Cycle 14 nivolumab 03/24/2022 Cycle 15 nivolumab 04/21/2022 Cycle 16 nivolumab 05/19/2022 Cycle 17 nivolumab 06/16/2022 Cycle 18 nivolumab 07/14/2022 CTs 08/16/2022-stable ill-defined soft tissue in the right retroperitoneum, very mild intra and EXTR hepatic biliary duct dilation, common bile duct dilated to 11 mm in the porta hepatis Cycle 19 nivolumab 08/18/2022 Cycle 20 nivolumab 09/15/2022 Cycle 21 nivolumab 10/18/2022 Cycle 22 nivolumab 11/15/2022 Left labial lesions. Question direct extension from the anal cancer versus metastatic disease from anal cancer versus a separate malignant process. 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 Multiple family members with breast cancer. History of hypokalemia-likely secondary to decreased nutritional intake and diarrhea; potassium in normal range 01/16/2017. No longer taking a potassium supplement. Right hydroureteronephrosis on CT 01/28/2020, status post a cystoscopy/pyelogram 01/28/2020 confirming extrinsic compression of the right ureter, status post stent placement Right ureter stent exchange 06/12/2020 Right ureter stent removed 01/13/2021   8.  Admission 01/07/2021 with Klebsiella pneumoniae urosepsis/bacteremia  9.  Hospital admission 05/15/2021 with UTI/possible right pyelonephritis    Disposition: Ms. KSarrattappears stable.  She has completed 21 cycles of nivolumab.  There is no clinical evidence of disease progression.  Plan to continue nivolumab every 4 weeks, treatment today.  Next restaging CT evaluation in approximately 3 months.  CBC and chemistry panel reviewed.  Labs adequate to proceed with nivolumab  as above.  She complains of increased fatigue.  We will follow-up on the thyroid studies from today and also check a cortisol level.  She will return for follow-up in 4 weeks.  She will contact the office in the interim with any problems.    LNed CardANP/GNP-BC   11/15/2022  1:27 PM

## 2022-11-15 NOTE — Patient Instructions (Signed)
Ashley Moore   Discharge Instructions: Thank you for choosing [facility] to provide your oncology and hematology care.   If you have a lab appointment with the [facility], please go directly to the [facility] and check in at the registration area.   Wear comfortable clothing and clothing appropriate for easy access to any Portacath or PICC line.   We strive to give you quality time with your provider. You may need to reschedule your appointment if you arrive late (15 or more minutes).  Arriving late affects you and other patients whose appointments are after yours.  Also, if you miss three or more appointments without notifying the office, you may be dismissed from the clinic at the provider's discretion.      For prescription refill requests, have your pharmacy contact our office and allow 72 hours for refills to be completed.    Today you received the following chemotherapy and/or immunotherapy agents Opdivo      To help prevent nausea and vomiting after your treatment, we encourage you to take your nausea medication as directed.  BELOW ARE SYMPTOMS THAT SHOULD BE REPORTED IMMEDIATELY: *FEVER GREATER THAN 100.4 F (38 C) OR HIGHER *CHILLS OR SWEATING *NAUSEA AND VOMITING THAT IS NOT CONTROLLED WITH YOUR NAUSEA MEDICATION *UNUSUAL SHORTNESS OF BREATH *UNUSUAL BRUISING OR BLEEDING *URINARY PROBLEMS (pain or burning when urinating, or frequent urination) *BOWEL PROBLEMS (unusual diarrhea, constipation, pain near the anus) TENDERNESS IN MOUTH AND THROAT WITH OR WITHOUT PRESENCE OF ULCERS (sore throat, sores in mouth, or a toothache) UNUSUAL RASH, SWELLING OR PAIN  UNUSUAL VAGINAL DISCHARGE OR ITCHING   Items with * indicate a potential emergency and should be followed up as soon as possible or go to the Emergency Department if any problems should occur.  Please show the CHEMOTHERAPY ALERT CARD or IMMUNOTHERAPY ALERT CARD at check-in  to the Emergency Department and triage nurse.  Should you have questions after your visit or need to cancel or reschedule your appointment, please contact [facility]  Dept: [phone]  and follow the prompts.  Office hours are 8:00 a.m. to 4:30 p.m. Monday - Friday. Please note that voicemails left after 4:00 p.m. may not be returned until the following business day.  We are closed weekends and major holidays. You have access to a nurse at all times for urgent questions. Please call the main number to the clinic Dept: [phone] and follow the prompts.   For any non-urgent questions, you may also contact your provider using MyChart. We now offer e-Visits for anyone 6 and older to request care online for non-urgent symptoms. For details visit [email or web address].   Also download the MyChart app! Go to the app store, search "MyChart", open the app, select [facility], and log in with your MyChart username and password.  Nivolumab Injection What is this medication? NIVOLUMAB (nye VOL ue mab) treats some types of cancer. It works by helping your immune system slow or stop the spread of cancer cells. It is a monoclonal antibody. This medicine may be used for other purposes; ask your health care provider or pharmacist if you have questions. COMMON BRAND NAME(S): Opdivo What should I tell my care team before I take this medication? They need to know if you have any of these conditions: Allogeneic stem cell transplant (uses someone else's stem cells) Autoimmune diseases, such as Crohn disease, ulcerative colitis, lupus History of chest radiation Nervous system problems,  such as Guillain-Barre syndrome or myasthenia gravis Organ transplant An unusual or allergic reaction to nivolumab, other medications, foods, dyes, or preservatives Pregnant or trying to get pregnant Breast-feeding How should I use this medication? This medication is infused into a vein.  It is given in a hospital or clinic setting. A special MedGuide will be given to you before each treatment. Be sure to read this information carefully each time. Talk to your care team about the use of this medication in children. While it may be prescribed for children as young as 12 years for selected conditions, precautions do apply. Overdosage: If you think you have taken too much of this medicine contact a poison control center or emergency room at once. NOTE: This medicine is only for you. Do not share this medicine with others. What if I miss a dose? Keep appointments for follow-up doses. It is important not to miss your dose. Call your care team if you are unable to keep an appointment. What may interact with this medication? Interactions have not been studied. This list may not describe all possible interactions. Give your health care provider a list of all the medicines, herbs, non-prescription drugs, or dietary supplements you use. Also tell them if you smoke, drink alcohol, or use illegal drugs. Some items may interact with your medicine. What should I watch for while using this medication? Your condition will be monitored carefully while you are receiving this medication. You may need blood work while taking this medication. This medication may cause serious skin reactions. They can happen weeks to months after starting the medication. Contact your care team right away if you notice fevers or flu-like symptoms with a rash. The rash may be red or purple and then turn into blisters or peeling of the skin. You may also notice a red rash with swelling of the face, lips, or lymph nodes in your neck or under your arms. Tell your care team right away if you have any change in your eyesight. Talk to your care team if you are pregnant or think you might be pregnant. A negative pregnancy test is required before starting this medication. A reliable form of contraception is recommended while taking  this medication and for 5 months after the last dose. Talk to your care team about effective forms of contraception. Do not breast-feed while taking this medication and for 5 months after the last dose. What side effects may I notice from receiving this medication? Side effects that you should report to your care team as soon as possible: Allergic reactions--skin rash, itching, hives, swelling of the face, lips, tongue, or throat Dry cough, shortness of breath or trouble breathing Eye pain, redness, irritation, or discharge with blurry or decreased vision Heart muscle inflammation--unusual weakness or fatigue, shortness of breath, chest pain, fast or irregular heartbeat, dizziness, swelling of the ankles, feet, or hands Hormone gland problems--headache, sensitivity to light, unusual weakness or fatigue, dizziness, fast or irregular heartbeat, increased sensitivity to cold or heat, excessive sweating, constipation, hair loss, increased thirst or amount of urine, tremors or shaking, irritability Infusion reactions--chest pain, shortness of breath or trouble breathing, feeling faint or lightheaded Kidney injury (glomerulonephritis)--decrease in the amount of urine, red or dark brown urine, foamy or bubbly urine, swelling of the ankles, hands, or feet Liver injury--right upper belly pain, loss of appetite, nausea, light-colored stool, dark yellow or brown urine, yellowing skin or eyes, unusual weakness or fatigue Pain, tingling, or numbness in the hands or feet, muscle  weakness, change in vision, confusion or trouble speaking, loss of balance or coordination, trouble walking, seizures Rash, fever, and swollen lymph nodes Redness, blistering, peeling, or loosening of the skin, including inside the mouth Sudden or severe stomach pain, bloody diarrhea, fever, nausea, vomiting Side effects that usually do not require medical attention (report these to your care team if they continue or are  bothersome): Bone, joint, or muscle pain Diarrhea Fatigue Loss of appetite Nausea Skin rash This list may not describe all possible side effects. Call your doctor for medical advice about side effects. You may report side effects to FDA at 1-800-FDA-1088. Where should I keep my medication? This medication is given in a hospital or clinic. It will not be stored at home. NOTE: This sheet is a summary. It may not cover all possible information. If you have questions about this medicine, talk to your doctor, pharmacist, or health care provider.  2023 [employer]vier/Gold Standard (2014-07-28 00:00:00)

## 2022-11-16 ENCOUNTER — Other Ambulatory Visit: Payer: Self-pay

## 2022-11-16 ENCOUNTER — Inpatient Hospital Stay: Payer: Medicare HMO

## 2022-11-16 ENCOUNTER — Inpatient Hospital Stay: Payer: Medicare HMO | Admitting: Nurse Practitioner

## 2022-11-17 LAB — T4: T4, Total: 9 ug/dL (ref 4.5–12.0)

## 2022-11-22 ENCOUNTER — Telehealth (INDEPENDENT_AMBULATORY_CARE_PROVIDER_SITE_OTHER): Payer: Medicare HMO | Admitting: Family Medicine

## 2022-11-22 DIAGNOSIS — Z0000 Encounter for general adult medical examination without abnormal findings: Secondary | ICD-10-CM

## 2022-11-22 NOTE — Progress Notes (Signed)
PATIENT CHECK-IN and HEALTH RISK ASSESSMENT QUESTIONNAIRE:  -completed by phone/video for upcoming Medicare Preventive Visit  Pre-Visit Check-in: 1)Vitals (height, wt, BP, etc) - record in vitals section for visit on day of visit 2)Review and Update Medications, Allergies PMH, Surgeries, Social history in Epic 3)Hospitalizations in the last year with date/reason?  No 4)Review and Update Care Team (patient's specialists) in Epic 5) Complete PHQ9 in Epic  6) Complete Fall Screening in Epic 7)Review all Health Maintenance Due and order under PCP if not done.  8)Medicare Wellness Questionnaire: Answer theses question about your habits: Do you drink alcohol? Yes  If yes, how many drinks do you have a day?once per month have a glass of wine Have you ever smoked?Yes   Quit date if applicable?  1996 How many packs a day do/did you smoke? 1 pack Do you use smokeless tobacco?No  Do you use an illicit drugs?Yes marijuana Do you exercises? No, If so, what type and how many days/minutes per week? She is considering getting PT and starting to exercise. Does some shoulder exercises at home.  Eats a lot of berries and vegetables Typical breakfast: protein shake, bananna, blueberries Typical lunch:protein, vegetable Typical dinner: goat cheese on bread Typical snacks:potato chips  Beverages: coffee, two cups of green tea, two glasses  Answer theses question about you: Can you perform most household chores?Yes, do have maid Do you find it hard to follow a conversation in a noisy room?No  Do you often ask people to speak up or repeat themselves?Only if can't hear Do you feel that you have a problem with memory?No  Do you balance your checkbook and or bank acounts?yes Do you feel safe at home?yes Last dentist visit?December 2023 Do you need assistance with any of the following: Please note if so No assistance needed.  Cane to help up stairs  Driving?  Feeding yourself?  Getting from bed to  chair?  Getting to the toilet?  Bathing or showering?  Dressing yourself?  Managing money?  Climbing a flight of stairs- Use a cane   Preparing meals?  Do you have Advanced Directives in place (Living Will, Healthcare Power or Attorney)? Yes   Last eye Exam and location?2024-[facility]   Do you currently use prescribed or non-prescribed narcotic or opioid pain medications?Tylenol Arthritis    Nurse/Assistant Credentials/time stamp:St    ----------------------------------------------------------------------------------------------------------------------------------------------------------------------------------------------------------------------   MEDICARE ANNUAL PREVENTIVE VISIT WITH PROVIDER: (Welcome to [employer], initial annual wellness or annual wellness exam)  Virtual Visit via Video Note  I connected with Reva Bores on 11/22/22 by a video enabled telemedicine application and verified that I am speaking with the correct person using two identifiers.  Location patient: home Location provider:work or home office Persons participating in the virtual visit: patient, provider  Concerns and/or follow up today: reports is stable, cancer is in remission. She has orthopedic issues, hx of broken bones. She has been improving but wants a new orthopedic specialist as had a bad experience with current office.    See HM section in Epic for other details of completed HM.    ROS: denies other issues  Patient-completed extensive health risk assessment - reviewed and discussed with the patient: See Health Risk Assessment completed with patient prior to the visit either above or in recent phone note. This was reviewed in detailed with the patient today and appropriate recommendations, orders and referrals were placed as needed per Summary below and patient instructions.   Review of Medical History: -PMH, PSH, Family History and  current specialty and care providers  reviewed and updated and listed below   Patient Care Team: Panosh, Standley Brooking, MD as PCP - General Inda Castle, MD (Inactive) as Attending Physician (Gastroenterology) [name], MD as Consulting Physician (Oncology) Kyung Rudd, MD as Consulting Physician (Radiation Oncology) Pieter Partridge, DO as Consulting Physician (Neurology) [name], NP as Nurse Practitioner (Nurse Practitioner) Marcy Siren, MD as Referring Physician (Obstetrics and Gynecology) Viona Gilmore, [facility] (Inactive) as Pharmacist (Pharmacist)   Past Medical History:  Diagnosis Date   Arthritis    Bowen's disease    excised 1992   Chronic low back pain    Chronic radiation cystitis    DDD (degenerative disc disease), cervical    Family history of breast cancer    Family history of lung cancer    Family history of non-Hodgkin's lymphoma    Family history of ovarian cancer    Headache    Hx of varicella    Hydronephrosis of right kidney    urologist--- dr Tresa Moore,  malignant,  treated with ureter stent   Hypothyroidism    followed by pcp   Lower urinary tract symptoms (LUTS)    01-12-2021  per pt treated with accupuncture at dr Tresa Moore office   Lymphedema of both lower extremities    PICC (peripherally inserted central catheter) in place 01/11/2021   placed at [facility] in [place], [place] for IV antibiotics   Positive blood culture 01/08/2021   Klebsiella Pneumaniae,  treated with daily IV antibiotic   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--- dr Benay Spice   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,  invasive SCC , completed chemo/ radiation 12-23-2016;  recurrent metastatsis retroperitoneal lymphdenopathy,  completed radiation 04-13-2020 residual right ureteral uropathy obstruction   Retroperitoneal lymphadenopathy    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   Sepsis due to Klebsiella pneumoniae ([place]) 01/07/2021   pt admitted to [facility] in [place],  dx sepsis  secondary to acute pyelonephritis with bacteremia , positive blood culture,  discharged 01-11-2021 home daily IV antibiotic   Wears glasses     Past Surgical History:  Procedure Laterality Date   BUNIONECTOMY Right yrs ago   [place] Right 06/12/2020   Procedure: CYSTOSCOPY WITH RETROGRADE PYELOGRAM/URETERAL STENT EXCHANGE;  Surgeon: Alexis Frock, MD;  Location: [facility];  Service: Urology;  Laterality: Right;   CYSTOSCOPY W/ URETERAL STENT PLACEMENT Right 01/13/2021   Procedure: CYSTOSCOPY WITH RETROGRADE PYELOGRAM/URETERAL STENT REMOVALRIGHT;  Surgeon: Alexis Frock, MD;  Location[facility];  Service: Urology;  Laterality: Right;  [place], URETEROSCOPY AND STENT PLACEMENT Right 01/28/2020   Procedure: CYSTOSCOPY WITH RETROGRADE PYELOGRAM, URETEROSCOPY AND STENT PLACEMENT;  Surgeon: Alexis Frock, MD;  Location: [facility];  Service: Urology;  Laterality: Right;   IR GENERIC HISTORICAL  11/14/2016   IR US GUIDE VASC ACCESS RIGHT 11/14/2016 [facility]   IR GENERIC HISTORICAL  11/14/2016   IR FLUORO GUIDE CV LINE RIGHT 11/14/2016 [facility]   IR GENERIC HISTORICAL  12/12/2016   IR US GUIDE VASC ACCESS RIGHT 12/12/2016 [name], MD [facility]   IR GENERIC HISTORICAL  12/12/2016   IR FLUORO GUIDE CV LINE RIGHT 12/12/2016 [name], MD [facility]   SKIN CANCER EXCISION  1990   bowens disease    Social History   Socioeconomic History   Marital status: Widowed    Spouse name: Not on  file   Number of children: 0   Years of education: 14   Highest education level: Associate degree: academic program  Occupational History   Occupation: self employed    Comment: full time Chief of Staff  Tobacco Use   Smoking status: Former    Packs/day: 1.00    Years: 25.00    Total pack years: 25.00    Types: Cigarettes    Quit date: 06/20/1995    Years since quitting: 27.4   Smokeless  tobacco: Never  Vaping Use   Vaping Use: Never used  Substance and Sexual Activity   Alcohol use: Not Currently    Alcohol/week: 0.0 standard drinks of alcohol    Comment: occasional wine   Drug use: Yes    Types: Marijuana    Comment: 01-12-2021  per pt once a week   Sexual activity: Not on file  Other Topics Concern   Not on file  Social History Narrative   H H  of 1      5 pets.   She is a former smoker   Retired Programmer, applications; Conservator, museum/gallery   etoh   Red wine  1 per night.    Tea green tea and earl gray    Moved from [place] to [place] area in [place]-Dec[place]1983   1 pregnancy   Husband died spring  2016 cv   Sister died 03/30/2015  Bone cancer    3 remaining sisters            Social Determinants of Health   Financial Resource Strain: Low Risk  (06/10/2022)   Overall Financial Resource Strain (CARDIA)    Difficulty of Paying Living Expenses: Not hard at all  Food Insecurity: No Food Insecurity (11/18/2021)   Hunger Vital Sign    Worried About Running Out of Food in the Last Year: Never true    Ran Out of Food in the Last Year: Never true  Transportation Needs: No Transportation Needs (11/18/2021)   PRAPARE - Hydrologist (Medical): No    Lack of Transportation (Non-Medical): No  Physical Activity: Inactive (11/18/2021)   Exercise Vital Sign    Days of Exercise per Week: 0 days    Minutes of Exercise per Session: 0 min  Stress: No Stress Concern Present (11/18/2021)   [place]    Feeling of Stress : Not at all  Social Connections: Moderately Integrated (11/18/2021)   Social Connection and Isolation Panel [NHANES]    Frequency of Communication with Friends and Family: More than three times a week    Frequency of Social Gatherings with Friends and Family: More than three times a week    Attends Religious Services: More than 4 times per  year    Active Member of [employer] or Organizations: Yes    Attends Archivist Meetings: More than 4 times per year    Marital Status: Widowed  Intimate Partner Violence: Not At Risk (11/18/2021)   Humiliation, Afraid, Rape, and Kick questionnaire    Fear of Current or Ex-Partner: No    Emotionally Abused: No    Physically Abused: No    Sexually Abused: No    Family History  Problem Relation Age of Onset   Lung cancer Mother        lung   Breast cancer Mother 6   Ovarian cancer Mother 32   Pancreatitis Father  Breast cancer Sister 77   Breast cancer Maternal Grandmother 54       breast    Breast cancer Maternal Aunt 75       breast   Melanoma Sister    Breast cancer Sister 71   Non-Hodgkin's lymphoma Paternal Grandmother     Current Outpatient Medications on File Prior to Visit  Medication Sig Dispense Refill   acetaminophen (TYLENOL) 650 MG CR tablet Take 650 mg by mouth 2 (two) times daily.     Ascorbic Acid (VITAMIN C) 500 MG CAPS Take 500 mg by mouth daily.     azelastine (ASTELIN) 0.1 % nasal spray Place 2 sprays into both nostrils 2 (two) times daily. Use in each nostril as directed 30 mL 1   Calcium Carb-Cholecalciferol 600-10 MG-MCG CAPS Take by mouth.     conjugated estrogens (PREMARIN) vaginal cream Place 1 Applicatorful vaginally daily.     Cranberry 500 MG TABS Take 1 tablet by mouth at bedtime.     levothyroxine (SYNTHROID) 75 MCG tablet Take 1 tablet (75 mcg total) by mouth daily. *Appointment required for future refills 90 tablet 0   LORazepam (ATIVAN) 0.5 MG tablet Take 1 tablet (0.5 mg total) by mouth every 8 (eight) hours as needed for anxiety (as needed). 30 tablet 0   MYRBETRIQ 50 MG TB24 tablet Take 50 mg by mouth daily.     OVER THE COUNTER MEDICATION Place 1 drop into both eyes daily as needed (dry eyes).     OVER THE COUNTER MEDICATION Take 1 Scoop by mouth daily. Essential Orange     Prenatal Vit-Fe Fumarate-FA (PRENATAL PO) Take by mouth  daily.     protein supplement shake (PREMIER PROTEIN) LIQD Take 11 oz by mouth daily.     TURMERIC PO Take 750 mg by mouth daily.     No current facility-administered medications on file prior to visit.    Allergies  Allergen Reactions   Sulfamethoxazole-Trimethoprim Other (See Comments)    C/o  abd pain and constipation   Benadryl [Diphenhydramine] Other (See Comments)    Tingle all over    Citrus Diarrhea   Melatonin     Tingle all over    Penicillins Rash    Did it involve swelling of the face/tongue/throat, SOB, or low BP? N Did it involve sudden or severe rash/hives, skin peeling, or any reaction on the inside of your mouth or nose? Y Did you need to seek medical attention at a hospital or doctor's office? N When did it last happen? Almost 50 years Ago      If all above answers are "NO", may proceed with cephalosporin use.        Physical Exam There were no vitals filed for this visit. Estimated body mass index is 30.91 kg/m as calculated from the following:   Height as of 11/15/22: '5\' 2"'$  (1.575 m).   Weight as of 11/15/22: 169 lb (76.7 kg).  EKG (optional): deferred due to virtual visit  GENERAL: alert, oriented, no acute distress detected, full vision exam deferred due to pandemic and/or virtual encounter  HEENT: atraumatic, conjunttiva clear, no obvious abnormalities on inspection of external nose and ears  NECK: normal movements of the head and neck  LUNGS: on inspection no signs of respiratory distress, breathing rate appears normal, no obvious gross SOB, gasping or wheezing  CV: no obvious cyanosis  MS: moves all visible extremities without noticeable abnormality  PSYCH/NEURO: pleasant and cooperative, no obvious depression or anxiety, speech  and thought processing grossly intact, Cognitive function grossly intact  Flowsheet Row Video Visit from 11/22/2022 in [facility]  PHQ-9 Total Score 4           11/22/2022    4:12  PM 11/22/2022    4:11 PM 06/17/2022   12:12 PM 01/20/2022   11:49 AM 11/18/2021    2:56 PM  Depression screen PHQ 2/9  Decreased Interest 0 0 0 0 0  Down, Depressed, Hopeless  0 0 0 0  PHQ - 2 Score 0 0 0 0 0  Altered sleeping 0   0 0  Tired, decreased energy 1   2 0  Change in appetite 3   0 0  Feeling bad or failure about yourself  0   0 0  Trouble concentrating 0   0 0  Moving slowly or fidgety/restless 0   0 0  Suicidal thoughts 0   0 0  PHQ-9 Score 4   2 0  Difficult doing work/chores Not difficult at all   Not difficult at all        06/17/2022   12:12 PM 07/14/2022    1:00 PM 08/18/2022   12:29 PM 09/15/2022   11:11 AM 11/22/2022    4:11 PM  Fall Risk  Falls in the past year? 0    0  Was there an injury with Fall? 0    0  Fall Risk Category Calculator 0    0  Fall Risk Category (Retired) Low      (RETIRED) Patient Fall Risk Level Low fall risk Low fall risk Low fall risk High fall risk   Patient at Risk for Falls Due to Other (Comment)      Fall risk Follow up Falls evaluation completed         SUMMARY AND PLAN:  Encounter for Medicare annual wellness exam   Discussed applicable health maintenance/preventive health measures and advised and referred or ordered per patient preferences:  Health Maintenance  Topic Date Due   Zoster Vaccines- Shingrix (1 of 2) Never done, discussed, can get at pharmacy if wishes to do   COVID-19 Vaccine (4 - 2023-24 season) 05/20/2022, discussed, can get at pharmacy if wishes to do   COLONOSCOPY (Pts 45-26yr Insurance coverage will need to be confirmed)  Reports sees a colon cancer specialist and gets scans instead of colonoscopies.   DTaP/Tdap/Td (2 - Td or Tdap) 02/14/2023   MAMMOGRAM  08/05/2023   Medicare Annual Wellness (AWV)  11/22/2023   Pneumonia Vaccine 72 Years old  Completed   INFLUENZA VACCINE  Completed   DEXA SCAN  Completed   Hepatitis C Screening  Completed   HPV VACCINES  Aged Out    Education and counseling on  the following was provided based on the above review of health and a plan/checklist for the patient, along with additional information discussed, was provided for the patient in the patient instructions :   - -Advised and counseled on maintaining healthy weight and healthy lifestyle - including the importance of a healthy diet, regular physical activity, social connections and stress management. -Advised and counseled on importance of a whole foods based healthy diet and regular exercise. A summary of a healthy diet was provided in the Patient Instructions. Recommended regular exercise, discussed guidelines and her limitations and discussed options within the community. Offered PT referral, however, she would like to see an orthopedic specialist first and discussed options. Discussed safe gentle exercises she could  try at home. -Advise yearly dental visits at minimum and regular eye exams   Follow up: see patient instructions     Patient Instructions  I really enjoyed getting to talk with you today! I am available on Tuesdays and Thursdays for virtual visits if you have any questions or concerns, or if I can be of any further assistance.   CHECKLIST FROM ANNUAL WELLNESS VISIT:  -Follow up (please call to schedule if not scheduled after visit):   -yearly for annual wellness visit with primary care office  Here is a list of your preventive care/health maintenance measures and the plan for each if any are due:  Health Maintenance  Topic Date Due   Zoster Vaccines- Shingrix (1 of 2) Never done   COVID-19 Vaccine (4 - 2023-24 season) 05/20/2022   COLONOSCOPY (Pts 45-70yr Insurance coverage will need to be confirmed)  11/21/2024 (Originally 11/05/2020)   DTaP/Tdap/Td (2 - Td or Tdap) 02/14/2023   MAMMOGRAM  08/05/2023   Medicare Annual Wellness (AWV)  11/22/2023   Pneumonia Vaccine 72 Years old  Completed   INFLUENZA VACCINE  Completed   DEXA SCAN  Completed   Hepatitis C Screening   Completed   HPV VACCINES  Aged Out    -See a dentist at least yearly  -Get your eyes checked and then per your eye specialist's recommendations  -Other issues addressed today:   -I have included below further information regarding a healthy whole foods based diet, physical activity guidelines for adults, stress management and opportunities for social connections. I hope you find this information useful.   -----------------------------------------------------------------------------------------------------------------------------------------------------------------------------------------------------------------------------------------------------------  NUTRITION: -eat real food: lots of colorful vegetables (half the plate) and fruits -5-7 servings of vegetables and fruits per day (fresh or steamed is best), exp. 2 servings of vegetables with lunch and dinner and 2 servings of fruit per day. Berries and greens such as kale and collards are great choices.  -consume on a regular basis: whole grains (make sure first ingredient on label contains the word "whole"), fresh fruits, fish, nuts, seeds, healthy oils (such as olive oil, avocado oil, grape seed oil) -may eat small amounts of dairy and lean meat on occasion, but avoid processed meats such as ham, bacon, lunch meat, etc. -drink water -try to avoid fast food and pre-packaged foods, processed meat -most experts advise limiting sodium to < '2300mg'$  per day, should limit further is any chronic conditions such as high blood pressure, heart disease, diabetes, etc. The American Heart Association advised that < '1500mg'$  is is ideal -try to avoid foods that contain any ingredients with names you do not recognize  -try to avoid sugar/sweets (except for the natural sugar that occurs in fresh fruit) -try to avoid sweet drinks -try to avoid white rice, white bread, pasta (unless whole grain), white or yellow potatoes  EXERCISE GUIDELINES FOR  ADULTS: -if you wish to increase your physical activity, do so gradually and with the approval of your doctor -STOP and seek medical care immediately if you have any chest pain, chest discomfort or trouble breathing when starting or increasing exercise  -move and stretch your body, legs, feet and arms when sitting for long periods -Physical activity guidelines for optimal health in adults: -least 150 minutes per week of aerobic exercise (can talk, but not sing) once approved by your doctor, 20-30 minutes of sustained activity or two 10 minute episodes of sustained activity every day.  -resistance training at least 2 days per week if approved by your doctor -balance  exercises 3+ days per week:   Stand somewhere where you have something sturdy to hold onto if you lose balance.    1) lift up on toes, start with 5x per day and work up to 20x   2) stand and lift on leg straight out to the side so that foot is a few inches of the floor, start with 5x each side and work up to 20x each side   3) stand on one foot, start with 5 seconds each side and work up to 20 seconds on each side  If you need ideas or help with getting more active:  -Silver sneakers https://tools.silversneakers.com  -Walk with a Doc: http://stephens-thompson.biz/  -try to include resistance (weight lifting/strength building) and balance exercises twice per week: or the following link for ideas: ChessContest.fr  UpdateClothing.com.cy  STRESS MANAGEMENT: -can try meditating, or just sitting quietly with deep breathing while intentionally relaxing all parts of your body for 5 minutes daily -if you need further help with stress, anxiety or depression please follow up with your primary doctor or contact the wonderful folks at [facility]: [place]: -options in [place] if you wish to engage in more  social and exercise related activities:  -Silver sneakers https://tools.silversneakers.com  -Walk with a Doc: http://stephens-thompson.biz/  -Check out the [place] 50+ section on the [place] [employer] (hiking clubs, book clubs, cards and games, chess, exercise classes, aquatic classes and much more) - see the website for details: https://www.[place].gov/departments/parks-recreation/active-adults50  -YouTube has lots of exercise videos for different ages and abilities as well  -[place] (a variety of indoor and outdoor inperson activities for adults). 713-556-5204. [address].  -Virtual Online Classes (a variety of topics): see seniorplanet.org or call 431-406-1295  -consider volunteering at a school, hospice center, church, senior center or elsewhere           Lucretia Kern, DO

## 2022-11-22 NOTE — Patient Instructions (Addendum)
I really enjoyed getting to talk with you today! I am available on Tuesdays and Thursdays for virtual visits if you have any questions or concerns, or if I can be of any further assistance.   CHECKLIST FROM ANNUAL WELLNESS VISIT:  -Follow up (please call to schedule if not scheduled after visit):   -yearly for annual wellness visit with primary care office  Here is a list of your preventive care/health maintenance measures and the plan for each if any are due:  Health Maintenance  Topic Date Due   Zoster Vaccines- Shingrix (1 of 2) Never done   COVID-19 Vaccine (4 - 2023-24 season) 05/20/2022   COLONOSCOPY (Pts 45-75yr Insurance coverage will need to be confirmed)  11/21/2024 (Originally 11/05/2020)   DTaP/Tdap/Td (2 - Td or Tdap) 02/14/2023   MAMMOGRAM  08/05/2023   Medicare Annual Wellness (AWV)  11/22/2023   Pneumonia Vaccine 74 Years old  Completed   INFLUENZA VACCINE  Completed   DEXA SCAN  Completed   Hepatitis C Screening  Completed   HPV VACCINES  Aged Out    -See a dentist at least yearly  -Get your eyes checked and then per your eye specialist's recommendations  -Other issues addressed today:   -I have included below further information regarding a healthy whole foods based diet, physical activity guidelines for adults, stress management and opportunities for social connections. I hope you find this information useful.   -----------------------------------------------------------------------------------------------------------------------------------------------------------------------------------------------------------------------------------------------------------  NUTRITION: -eat real food: lots of colorful vegetables (half the plate) and fruits -5-7 servings of vegetables and fruits per day (fresh or steamed is best), exp. 2 servings of vegetables with lunch and dinner and 2 servings of fruit per day. Berries and greens such as kale and collards are great  choices.  -consume on a regular basis: whole grains (make sure first ingredient on label contains the word "whole"), fresh fruits, fish, nuts, seeds, healthy oils (such as olive oil, avocado oil, grape seed oil) -may eat small amounts of dairy and lean meat on occasion, but avoid processed meats such as ham, bacon, lunch meat, etc. -drink water -try to avoid fast food and pre-packaged foods, processed meat -most experts advise limiting sodium to < '2300mg'$  per day, should limit further is any chronic conditions such as high blood pressure, heart disease, diabetes, etc. The American Heart Association advised that < '1500mg'$  is is ideal -try to avoid foods that contain any ingredients with names you do not recognize  -try to avoid sugar/sweets (except for the natural sugar that occurs in fresh fruit) -try to avoid sweet drinks -try to avoid white rice, white bread, pasta (unless whole grain), white or yellow potatoes  EXERCISE GUIDELINES FOR ADULTS: -if you wish to increase your physical activity, do so gradually and with the approval of your doctor -STOP and seek medical care immediately if you have any chest pain, chest discomfort or trouble breathing when starting or increasing exercise  -move and stretch your body, legs, feet and arms when sitting for long periods -Physical activity guidelines for optimal health in adults: -least 150 minutes per week of aerobic exercise (can talk, but not sing) once approved by your doctor, 20-30 minutes of sustained activity or two 10 minute episodes of sustained activity every day.  -resistance training at least 2 days per week if approved by your doctor -balance exercises 3+ days per week:   Stand somewhere where you have something sturdy to hold onto if you lose balance.    1) lift up on  toes, start with 5x per day and work up to 20x   2) stand and lift on leg straight out to the side so that foot is a few inches of the floor, start with 5x each side and work  up to 20x each side   3) stand on one foot, start with 5 seconds each side and work up to 20 seconds on each side  If you need ideas or help with getting more active:  -Silver sneakers https://tools.silversneakers.com  -Walk with a Doc: http://stephens-thompson.biz/  -try to include resistance (weight lifting/strength building) and balance exercises twice per week: or the following link for ideas: ChessContest.fr  UpdateClothing.com.cy  STRESS MANAGEMENT: -can try meditating, or just sitting quietly with deep breathing while intentionally relaxing all parts of your body for 5 minutes daily -if you need further help with stress, anxiety or depression please follow up with your primary doctor or contact the wonderful folks at [place]: [place]: -options in [place] if you wish to engage in more social and exercise related activities:  -Silver sneakers https://tools.silversneakers.com  -Walk with a Doc: http://stephens-thompson.biz/  -Check out the [place] 50+ section on the [place] [employer] (hiking clubs, book clubs, cards and games, chess, exercise classes, aquatic classes and much more) - see the website for details: https://www.[place].gov/departments/parks-recreation/active-adults50  -YouTube has lots of exercise videos for different ages and abilities as well  -[place] (a variety of indoor and outdoor inperson activities for adults). [phone]. [address]  -Virtual Online Classes (a variety of topics): see seniorplanet.org or call [phone]  -consider volunteering at a school, hospice center, church, senior center or elsewhere

## 2022-11-24 DIAGNOSIS — H0288B Meibomian gland dysfunction left eye, upper and lower eyelids: Secondary | ICD-10-CM | POA: Diagnosis not present

## 2022-11-24 DIAGNOSIS — H16233 Neurotrophic keratoconjunctivitis, bilateral: Secondary | ICD-10-CM | POA: Diagnosis not present

## 2022-11-24 DIAGNOSIS — H0288A Meibomian gland dysfunction right eye, upper and lower eyelids: Secondary | ICD-10-CM | POA: Diagnosis not present

## 2022-11-24 DIAGNOSIS — H16223 Keratoconjunctivitis sicca, not specified as Sjogren's, bilateral: Secondary | ICD-10-CM | POA: Diagnosis not present

## 2022-11-30 ENCOUNTER — Telehealth: Payer: Self-pay | Admitting: *Deleted

## 2022-11-30 NOTE — Progress Notes (Signed)
  Care Coordination  Outreach Note  11/30/2022 Name: MAGDALINE ZOLLARS MRN: [id number] DOB: [birth year]   Care Coordination Outreach Attempts: An unsuccessful telephone outreach was attempted today to offer the patient information about available care coordination services as a benefit of their health plan.   Follow Up Plan:  Additional outreach attempts will be made to offer the patient care coordination information and services.   Encounter Outcome:  No Answer  Julian Hy, [place] Direct Dial: [phone]

## 2022-12-01 DIAGNOSIS — H0288B Meibomian gland dysfunction left eye, upper and lower eyelids: Secondary | ICD-10-CM | POA: Diagnosis not present

## 2022-12-01 DIAGNOSIS — H16233 Neurotrophic keratoconjunctivitis, bilateral: Secondary | ICD-10-CM | POA: Diagnosis not present

## 2022-12-01 DIAGNOSIS — H18891 Other specified disorders of cornea, right eye: Secondary | ICD-10-CM | POA: Diagnosis not present

## 2022-12-01 DIAGNOSIS — H0288A Meibomian gland dysfunction right eye, upper and lower eyelids: Secondary | ICD-10-CM | POA: Diagnosis not present

## 2022-12-01 DIAGNOSIS — H16223 Keratoconjunctivitis sicca, not specified as Sjogren's, bilateral: Secondary | ICD-10-CM | POA: Diagnosis not present

## 2022-12-05 DIAGNOSIS — H0288B Meibomian gland dysfunction left eye, upper and lower eyelids: Secondary | ICD-10-CM | POA: Diagnosis not present

## 2022-12-05 DIAGNOSIS — H16233 Neurotrophic keratoconjunctivitis, bilateral: Secondary | ICD-10-CM | POA: Diagnosis not present

## 2022-12-05 DIAGNOSIS — H18892 Other specified disorders of cornea, left eye: Secondary | ICD-10-CM | POA: Diagnosis not present

## 2022-12-05 DIAGNOSIS — H16223 Keratoconjunctivitis sicca, not specified as Sjogren's, bilateral: Secondary | ICD-10-CM | POA: Diagnosis not present

## 2022-12-05 DIAGNOSIS — H18891 Other specified disorders of cornea, right eye: Secondary | ICD-10-CM | POA: Diagnosis not present

## 2022-12-05 DIAGNOSIS — H0288A Meibomian gland dysfunction right eye, upper and lower eyelids: Secondary | ICD-10-CM | POA: Diagnosis not present

## 2022-12-07 ENCOUNTER — Other Ambulatory Visit: Payer: Self-pay | Admitting: Hematology and Oncology

## 2022-12-07 ENCOUNTER — Encounter: Payer: Self-pay | Admitting: Oncology

## 2022-12-07 NOTE — Progress Notes (Signed)
ON PATHWAY REGIMEN - Anal Carcinoma  No Change  Continue With Treatment as Ordered.  Original Decision Date/Time: 03/01/2021 12:11     A cycle is every 28 days:     Paclitaxel      Carboplatin   **Always confirm dose/schedule in your pharmacy ordering system**  Patient Characteristics: Anal Canal Tumors, Distant Metastases / Local Recurrence - Unresectable, First Line Therapeutic Status: Distant Metastases Line of therapy: First Line  Intent of Therapy: Non-Curative / Palliative Intent, Discussed with Patient 

## 2022-12-11 ENCOUNTER — Other Ambulatory Visit: Payer: Self-pay | Admitting: Oncology

## 2022-12-11 DIAGNOSIS — C210 Malignant neoplasm of anus, unspecified: Secondary | ICD-10-CM

## 2022-12-14 ENCOUNTER — Inpatient Hospital Stay (HOSPITAL_BASED_OUTPATIENT_CLINIC_OR_DEPARTMENT_OTHER): Payer: Medicare HMO | Admitting: Oncology

## 2022-12-14 ENCOUNTER — Encounter: Payer: Self-pay | Admitting: Oncology

## 2022-12-14 ENCOUNTER — Inpatient Hospital Stay: Payer: Medicare HMO

## 2022-12-14 ENCOUNTER — Inpatient Hospital Stay: Payer: Medicare HMO | Attending: Nurse Practitioner

## 2022-12-14 VITALS — BP 129/73 | HR 72 | Temp 98.2°F | Resp 18 | Ht 62.0 in | Wt 169.0 lb

## 2022-12-14 VITALS — BP 131/71 | HR 72

## 2022-12-14 DIAGNOSIS — C210 Malignant neoplasm of anus, unspecified: Secondary | ICD-10-CM

## 2022-12-14 DIAGNOSIS — Z5112 Encounter for antineoplastic immunotherapy: Secondary | ICD-10-CM | POA: Insufficient documentation

## 2022-12-14 DIAGNOSIS — Z79620 Long term (current) use of immunosuppressive biologic: Secondary | ICD-10-CM | POA: Insufficient documentation

## 2022-12-14 LAB — CBC WITH DIFFERENTIAL (CANCER CENTER ONLY)
Abs Immature Granulocytes: 0.05 10*3/uL (ref 0.00–0.07)
Basophils Absolute: 0 10*3/uL (ref 0.0–0.1)
Basophils Relative: 1 %
Eosinophils Absolute: 0.2 10*3/uL (ref 0.0–0.5)
Eosinophils Relative: 3 %
HCT: 35.4 % — ABNORMAL LOW (ref 36.0–46.0)
Hemoglobin: 11.5 g/dL — ABNORMAL LOW (ref 12.0–15.0)
Immature Granulocytes: 1 %
Lymphocytes Relative: 11 %
Lymphs Abs: 0.6 10*3/uL — ABNORMAL LOW (ref 0.7–4.0)
MCH: 29.4 pg (ref 26.0–34.0)
MCHC: 32.5 g/dL (ref 30.0–36.0)
MCV: 90.5 fL (ref 80.0–100.0)
Monocytes Absolute: 0.4 10*3/uL (ref 0.1–1.0)
Monocytes Relative: 7 %
Neutro Abs: 4.2 10*3/uL (ref 1.7–7.7)
Neutrophils Relative %: 77 %
Platelet Count: 221 10*3/uL (ref 150–400)
RBC: 3.91 MIL/uL (ref 3.87–5.11)
RDW: 14.3 % (ref 11.5–15.5)
WBC Count: 5.5 10*3/uL (ref 4.0–10.5)
nRBC: 0 % (ref 0.0–0.2)

## 2022-12-14 LAB — CMP (CANCER CENTER ONLY)
ALT: 18 U/L (ref 0–44)
AST: 31 U/L (ref 15–41)
Albumin: 4.1 g/dL (ref 3.5–5.0)
Alkaline Phosphatase: 79 U/L (ref 38–126)
Anion gap: 7 (ref 5–15)
BUN: 28 mg/dL — ABNORMAL HIGH (ref 8–23)
CO2: 25 mmol/L (ref 22–32)
Calcium: 9.6 mg/dL (ref 8.9–10.3)
Chloride: 102 mmol/L (ref 98–111)
Creatinine: 0.88 mg/dL (ref 0.44–1.00)
GFR, Estimated: >60 mL/min (ref >60)
Glucose, Bld: 82 mg/dL (ref 70–99)
Potassium: 4.4 mmol/L (ref 3.5–5.1)
Sodium: 134 mmol/L — ABNORMAL LOW (ref 135–145)
Total Bilirubin: 0.4 mg/dL (ref 0.3–1.2)
Total Protein: 7.2 g/dL (ref 6.5–8.1)

## 2022-12-14 LAB — TSH: TSH: 3.241 u[IU]/mL (ref 0.350–4.500)

## 2022-12-14 MED ORDER — SODIUM CHLORIDE 0.9 % IV SOLN: Freq: Once | INTRAVENOUS | Status: AC

## 2022-12-14 MED ORDER — SODIUM CHLORIDE 0.9 % IV SOLN
480.0000 mg | Freq: Once | INTRAVENOUS | Status: AC
  Administered 2022-12-14: 480 mg via INTRAVENOUS
  Filled 2022-12-14: qty 48

## 2022-12-14 NOTE — Progress Notes (Signed)
[facility] OFFICE PROGRESS NOTE   Diagnosis: Anal cancer  INTERVAL HISTORY:   Ashley Moore returns as scheduled.  She completed another treatment with nivolumab on 11/15/2022.  No rash or diarrhea.  She feels well.  Good appetite.  No palpable lymph nodes.  Objective:  Vital signs in last 24 hours:  Blood pressure 129/73, pulse 72, temperature 98.2 F (36.8 C), temperature source Oral, resp. rate 18, height 5\' 2"  (1.575 m), weight 169 lb (76.7 kg), SpO2 100 %.    HEENT: Neck without mass Lymphatics: No cervical, supraclavicular, axillary, or inguinal nodes Resp: Lungs clear bilaterally Cardio: Regular rate and rhythm GI: No hepatosplenomegaly, no mass, nontender, soft tissue thickening in the suprapubic and groin area bilaterally Vascular: Trace lower leg edema bilaterally   Lab Results:  Lab Results  Component Value Date   WBC 5.5 12/14/2022   HGB 11.5 (L) 12/14/2022   HCT 35.4 (L) 12/14/2022   MCV 90.5 12/14/2022   PLT 221 12/14/2022   NEUTROABS 4.2 12/14/2022    CMP  Lab Results  Component Value Date   NA 134 (L) 12/14/2022   K 4.4 12/14/2022   CL 102 12/14/2022   CO2 25 12/14/2022   GLUCOSE 82 12/14/2022   BUN 28 (H) 12/14/2022   CREATININE 0.88 12/14/2022   CALCIUM 9.6 12/14/2022   PROT 7.2 12/14/2022   ALBUMIN 4.1 12/14/2022   AST 31 12/14/2022   ALT 18 12/14/2022   ALKPHOS 79 12/14/2022   BILITOT 0.4 12/14/2022   GFRNONAA >60 12/14/2022   GFRAA >60 06/17/2020    Lab Results  Component Value Date   CEA1 1.15 03/04/2021   CEA 1.16 03/04/2021    Lab Results  Component Value Date   INR 1.2 05/16/2021   LABPROT 15.2 05/16/2021    Imaging:  No results found.  Medications: I have reviewed the patient's current medications.   Assessment/Plan: Anal cancer CT abdomen/pelvis 10/21/2016-thickening of the anus extending to the junction between the anus and rectum with a large stool ball in the rectum and mild fat stranding  posterior to the rectum. Enlarged lymph nodes in the right and left inguinal regions. A few mildly prominent nodes seen posterior to the rectum. 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 PET scan [id number] hypermetabolic anal mass with hypermetabolic metastases to the groin region bilaterally, left pelvic sidewall and presacral space. Initiation of radiation and cycle 1 5-FU/mitomycin C 11/14/2016 Cycle 2 5-FU/mitomycin C 12/12/2016 (5-FU dose reduced due to mucositis, diarrhea, skin breakdown) Radiation completed 12/23/2016 CT abdomen/pelvis 04/14/2017-resolution of anal mass and bilateral inguinal lymphadenopathy. No residual tumor seen. CT abdomen/pelvis 01/28/2020-right hydroureteronephrosis, transition at the level of the mid right ureter, retroperitoneal lymphadenopathy CT renal stone study 01/28/2020-severe right hydronephrosis and proximal right hydroureter, 8 mm area of increased attenuation in the proximal right ureter-stone versus soft tissue mass PET 02/13/2020-hypermetabolic right retroperitoneal nodal metastases in the aortocaval and posterior pericaval chains, nonspecific mild anal wall hypermetabolism, right nephroureteral stent 02/21/2020 anorectal exam per Dr. Ashley Moore radiation changes of the skin around the anal margin.  Posterior midline smooth scarring.  Mildly stenotic.  Stenosis seems better.  No palpable concerning lesions.  Entire anal canal and distal rectum feels smooth and healthy.  Partial anoscopy performed with no lesions in the anal canal. Xeloda/radiation beginning 03/02/2020, completed 04/13/2020 CT abdomen/pelvis 06/17/2020-resolution of right retroperitoneal lymphadenopathy, no remaining pathologically enlarged lymph nodes, residual mild right hydronephrosis, no evidence of progressive disease, stable anal wall thickening Digital rectal exam by  Dr. Quentin Moore 08/26/2020-posterior midline smooth scarring.  Mildly stenotic.  Stenosis seems  better.  No palpable concerning lesions.  Anal canal feels smooth without palpable concern.  Unable to tolerate anoscopy.  Next visit in 6 months for surveillance. CT abdomen/pelvis 10/28/2020-no evidence of local recurrence or metastatic disease.  Stable mild rectal wall thickening.  New diffuse bladder wall thickening possibly related to prior radiation CT abdomen/pelvis 01/16/2021- recurrent right-sided hydronephrosis and proximal right hydroureter status post removal of right ureter stent, new soft tissue nodule along the course of the right ureter between the right psoas and IVC suspicious for recurrent lymphadenopathy PET 02/12/2021-small focus of hypermetabolism at the anorectal junction without a definable mass, right retroperitoneal nodal metastasis with obstruction of the proximal right ureter, right middle lobe hypermetabolic nodule, hypermetabolism in the right hilum without a defined nodal mass, hypermetabolic left supraclavicular and left axillary nodes 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 Cycle 1 Taxol/carboplatin 03/11/2021 Cycle 2 Taxol/carboplatin 03/31/2021, Fulphila Cycle 3 Taxol/carboplatin 04/21/2021, Fulphila CTs 05/11/2021-decrease size of FDG avid nodule in right middle lobe, right middle lobe subpleural nodule slightly enlarged, decreased in size of left axillary and subpectoral nodes, unchanged FDG avid right iliac nodes Cycle 4 Taxol/carboplatin 05/12/2021, G-CSF discontinued secondary to poor tolerance, Taxol and carboplatin dose reduced Cycle 5 Taxol/carboplatin 06/02/2021 Cycle 6 carboplatin 07/01/2021, Taxol held due to neuropathy Cycle 7 carboplatin 07/29/2021, Taxol held due to neuropathy CTs 08/16/2021-new liver lesion, increase in right psoas lymph node, unchanged right hydronephrosis, new mediastinal and right hilar lymphadenopathy, enlargement of bilateral pulmonary nodules and at least one new nodule, new  subcarinal node Cycle 1 nivolumab 08/25/2021 Cycle 2 nivolumab 09/08/2021 Cycle 3 nivolumab 09/22/2021 Cycle 4 nivolumab 10/06/2021 Cycle 5 nivolumab 10/20/2021 Cycle 6 nivolumab 11/04/2021 CTs 11/12/2021-resolution of lung nodules, decreased size of right psoas node, no remaining mediastinal lymphadenopathy, resolution of right liver lesion Cycle 7 nivolumab 11/17/2021 Cycle 8 nivolumab 12/01/2021 Cycle 9 nivolumab 12/15/2021 Cycle 10 nivolumab 12/30/2021 Cycle 11 nivolumab 01/12/2022 Cycle 12 nivolumab 5/10/290-monthly dosing Cycle 13 nivolumab 02/23/2022 CT 03/16/2022-stable right psoas mass, no pulmonary nodules, persistent circumferential anal thickening, unchanged subcentimeter left liver lesion Cycle 14 nivolumab 03/24/2022 Cycle 15 nivolumab 04/21/2022 Cycle 16 nivolumab 05/19/2022 Cycle 17 nivolumab 06/16/2022 Cycle 18 nivolumab 07/14/2022 CTs 08/16/2022-stable ill-defined soft tissue in the right retroperitoneum, very mild intra and EXTR hepatic biliary duct dilation, common bile duct dilated to 11 mm in the porta hepatis Cycle 19 nivolumab 08/18/2022 Cycle 20 nivolumab 09/15/2022 Cycle 21 nivolumab 10/18/2022 Cycle 22 nivolumab 11/15/2022 Cycle 23 nivolumab 12/14/2022 Left labial lesions. Question direct extension from the anal cancer versus metastatic disease from anal cancer versus a separate malignant process. 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 Multiple family members with breast cancer. History of hypokalemia-likely secondary to decreased nutritional intake and diarrhea; potassium in normal range 01/16/2017. No longer taking a potassium supplement. Right hydroureteronephrosis on CT 01/28/2020, status post a cystoscopy/pyelogram 01/28/2020 confirming extrinsic compression of the right ureter, status post stent placement Right ureter stent exchange 06/12/2020 Right ureter stent removed  01/13/2021   8.  Admission 01/07/2021 with Klebsiella pneumoniae urosepsis/bacteremia  9.  Hospital admission 05/15/2021 with UTI/possible right pyelonephritis      Disposition: Ms. Pisciotta appears stable.  She is tolerating the nivolumab well.  There is no clinical evidence for progression of anal cancer.  The plan is to continue monthly nivolumab.  She will  return for an office visit in nivolumab in 1 month.  She began nivolumab in December 2022.  The plan is to continue nivolumab for 2 years.  Ashley Coder, MD  12/14/2022  12:11 PM

## 2022-12-14 NOTE — Patient Instructions (Signed)
[facility]   Discharge Instructions: Thank you for choosing [facility] to provide your oncology and hematology care.   If you have a lab appointment with the [facility], please go directly to the [facility] and check in at the registration area.   Wear comfortable clothing and clothing appropriate for easy access to any Portacath or PICC line.   We strive to give you quality time with your provider. You may need to reschedule your appointment if you arrive late (15 or more minutes).  Arriving late affects you and other patients whose appointments are after yours.  Also, if you miss three or more appointments without notifying the office, you may be dismissed from the clinic at the provider's discretion.      For prescription refill requests, have your pharmacy contact our office and allow 72 hours for refills to be completed.    Today you received the following chemotherapy and/or immunotherapy agents Nivolumab.      To help prevent nausea and vomiting after your treatment, we encourage you to take your nausea medication as directed.  BELOW ARE SYMPTOMS THAT SHOULD BE REPORTED IMMEDIATELY: *FEVER GREATER THAN 100.4 F (38 C) OR HIGHER *CHILLS OR SWEATING *NAUSEA AND VOMITING THAT IS NOT CONTROLLED WITH YOUR NAUSEA MEDICATION *UNUSUAL SHORTNESS OF BREATH *UNUSUAL BRUISING OR BLEEDING *URINARY PROBLEMS (pain or burning when urinating, or frequent urination) *BOWEL PROBLEMS (unusual diarrhea, constipation, pain near the anus) TENDERNESS IN MOUTH AND THROAT WITH OR WITHOUT PRESENCE OF ULCERS (sore throat, sores in mouth, or a toothache) UNUSUAL RASH, SWELLING OR PAIN  UNUSUAL VAGINAL DISCHARGE OR ITCHING   Items with * indicate a potential emergency and should be followed up as soon as possible or go to the Emergency Department if any problems should occur.  Please show the CHEMOTHERAPY ALERT CARD or IMMUNOTHERAPY ALERT CARD at  check-in to the Emergency Department and triage nurse.  Should you have questions after your visit or need to cancel or reschedule your appointment, please contact [facility]  Dept: [phone]  and follow the prompts.  Office hours are 8:00 a.m. to 4:30 p.m. Monday - Friday. Please note that voicemails left after 4:00 p.m. may not be returned until the following business day.  We are closed weekends and major holidays. You have access to a nurse at all times for urgent questions. Please call the main number to the clinic Dept: [phone] and follow the prompts.   For any non-urgent questions, you may also contact your provider using MyChart. We now offer e-Visits for anyone 16 and older to request care online for non-urgent symptoms. For details visit [email or web address].   Also download the MyChart app! Go to the app store, search "MyChart", open the app, select [facility], and log in with your MyChart username and password.  Nivolumab Injection What is this medication? NIVOLUMAB (nye VOL ue mab) treats some types of cancer. It works by helping your immune system slow or stop the spread of cancer cells. It is a monoclonal antibody. This medicine may be used for other purposes; ask your health care provider or pharmacist if you have questions. COMMON BRAND NAME(S): Opdivo What should I tell my care team before I take this medication? They need to know if you have any of these conditions: Allogeneic stem cell transplant (uses someone else's stem cells) Autoimmune diseases, such as Crohn disease, ulcerative colitis, lupus History of chest radiation Nervous system problems,  such as Guillain-Barre syndrome or myasthenia gravis Organ transplant An unusual or allergic reaction to nivolumab, other medications, foods, dyes, or preservatives Pregnant or trying to get pregnant Breast-feeding How should I use this medication? This medication is infused into  a vein. It is given in a hospital or clinic setting. A special MedGuide will be given to you before each treatment. Be sure to read this information carefully each time. Talk to your care team about the use of this medication in children. While it may be prescribed for children as young as 12 years for selected conditions, precautions do apply. Overdosage: If you think you have taken too much of this medicine contact a poison control center or emergency room at once. NOTE: This medicine is only for you. Do not share this medicine with others. What if I miss a dose? Keep appointments for follow-up doses. It is important not to miss your dose. Call your care team if you are unable to keep an appointment. What may interact with this medication? Interactions have not been studied. This list may not describe all possible interactions. Give your health care provider a list of all the medicines, herbs, non-prescription drugs, or dietary supplements you use. Also tell them if you smoke, drink alcohol, or use illegal drugs. Some items may interact with your medicine. What should I watch for while using this medication? Your condition will be monitored carefully while you are receiving this medication. You may need blood work while taking this medication. This medication may cause serious skin reactions. They can happen weeks to months after starting the medication. Contact your care team right away if you notice fevers or flu-like symptoms with a rash. The rash may be red or purple and then turn into blisters or peeling of the skin. You may also notice a red rash with swelling of the face, lips, or lymph nodes in your neck or under your arms. Tell your care team right away if you have any change in your eyesight. Talk to your care team if you are pregnant or think you might be pregnant. A negative pregnancy test is required before starting this medication. A reliable form of contraception is recommended while  taking this medication and for 5 months after the last dose. Talk to your care team about effective forms of contraception. Do not breast-feed while taking this medication and for 5 months after the last dose. What side effects may I notice from receiving this medication? Side effects that you should report to your care team as soon as possible: Allergic reactions--skin rash, itching, hives, swelling of the face, lips, tongue, or throat Dry cough, shortness of breath or trouble breathing Eye pain, redness, irritation, or discharge with blurry or decreased vision Heart muscle inflammation--unusual weakness or fatigue, shortness of breath, chest pain, fast or irregular heartbeat, dizziness, swelling of the ankles, feet, or hands Hormone gland problems--headache, sensitivity to light, unusual weakness or fatigue, dizziness, fast or irregular heartbeat, increased sensitivity to cold or heat, excessive sweating, constipation, hair loss, increased thirst or amount of urine, tremors or shaking, irritability Infusion reactions--chest pain, shortness of breath or trouble breathing, feeling faint or lightheaded Kidney injury (glomerulonephritis)--decrease in the amount of urine, red or dark brown urine, foamy or bubbly urine, swelling of the ankles, hands, or feet Liver injury--right upper belly pain, loss of appetite, nausea, light-colored stool, dark yellow or brown urine, yellowing skin or eyes, unusual weakness or fatigue Pain, tingling, or numbness in the hands or feet, muscle  weakness, change in vision, confusion or trouble speaking, loss of balance or coordination, trouble walking, seizures Rash, fever, and swollen lymph nodes Redness, blistering, peeling, or loosening of the skin, including inside the mouth Sudden or severe stomach pain, bloody diarrhea, fever, nausea, vomiting Side effects that usually do not require medical attention (report these to your care team if they continue or are  bothersome): Bone, joint, or muscle pain Diarrhea Fatigue Loss of appetite Nausea Skin rash This list may not describe all possible side effects. Call your doctor for medical advice about side effects. You may report side effects to FDA at 1-800-FDA-1088. Where should I keep my medication? This medication is given in a hospital or clinic. It will not be stored at home. NOTE: This sheet is a summary. It may not cover all possible information. If you have questions about this medicine, talk to your doctor, pharmacist, or health care provider.  2023 Elsevier/Gold Standard (2014-07-28 00:00:00)

## 2022-12-14 NOTE — Progress Notes (Signed)
Patient seen by Dr. Sherrill today ? ?Vitals are within treatment parameters. ? ?Labs reviewed by Dr. Sherrill and are within treatment parameters. ? ?Per physician team, patient is ready for treatment and there are NO modifications to the treatment plan.  ?

## 2022-12-15 ENCOUNTER — Telehealth: Payer: Self-pay | Admitting: Internal Medicine

## 2022-12-15 NOTE — Telephone Encounter (Signed)
Pt is calling and would like a referral to [facility] phone number [phone] for physical therapy for broken shoulder and knee. Pt was having PT on [address] and does not want to return. Pt has [employer]

## 2022-12-16 LAB — T4: T4, Total: 8.7 ug/dL (ref 4.5–12.0)

## 2022-12-19 NOTE — Telephone Encounter (Signed)
So dont see  any  provider note for the past 6 months regarding problem   Needs ov eiither  here or better  yet , referral  to provider at [facility]  since she is having ongoing problem

## 2022-12-19 NOTE — Telephone Encounter (Signed)
Last therapy visit-05/30/22

## 2022-12-20 ENCOUNTER — Other Ambulatory Visit: Payer: Self-pay

## 2022-12-20 NOTE — Progress Notes (Signed)
  Care Coordination   Note   12/20/2022 Name: Ashley Moore MRN: [id number] DOB: Jan 28, 1951  Ashley Moore is a 72 y.o. year old female who sees Panosh, Standley Brooking, MD for primary care. I reached out to Reva Bores by phone today to offer care coordination services.  [name] was given information about Care Coordination services today including:   The Care Coordination services include support from the care team which includes your Nurse Coordinator, Clinical Social Worker, or Pharmacist.  The Care Coordination team is here to help remove barriers to the health concerns and goals most important to you. Care Coordination services are voluntary, and the patient may decline or stop services at any time by request to their care team member.   Care Coordination Consent Status: Patient agreed to services and verbal consent obtained.   Follow up plan:  Telephone appointment with care coordination team member scheduled for:  12/26/2022  Encounter Outcome:  Pt. Scheduled  Julian Hy, [place] Direct Dial: [phone]

## 2022-12-22 ENCOUNTER — Other Ambulatory Visit: Payer: Self-pay

## 2022-12-26 ENCOUNTER — Ambulatory Visit: Payer: Self-pay

## 2022-12-26 NOTE — Patient Outreach (Signed)
  Care Coordination   Initial Visit Note   12/26/2022 Name: Ashley Moore MRN: [id number] DOB: [birth year]  Ashley Moore is a 72 y.o. year old female who sees Panosh, Neta Mends, MD for primary care. I spoke with  Ashley Moore by phone today.  What matters to the patients health and wellness today?  "Get stronger"    Goals Addressed             This Visit's Progress    " Getting Stronger"       Patient Goals/Self Care Activities: -Patient/Caregiver will self-administer medications as prescribed as evidenced by self-report/primary caregiver report  -Patient/Caregiver will attend all scheduled provider appointments as evidenced by clinician review of documented attendance to scheduled appointments and patient/caregiver report -Patient/Caregiver will call provider office for new concerns or questions as evidenced by review of documented incoming telephone call notes and patient report   Patient wanting a referral to Ashley Moore for evaluation.  Patient with history of cancer that is now in remission. Patient wants to get stronger and work on knee and shoulder issues. Message sent to PCP for request.  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]  General Interventions   General Interventions Discussed/Reviewed General Interventions Discussed  Education Interventions   Education Provided Provided Education  Provided Verbal Education On Nutrition, Exercise, Medication  Mental Health Interventions   Mental Health Discussed/Reviewed Mental Health Discussed, Depression  Nutrition Interventions   Nutrition Discussed/Reviewed Nutrition Discussed, Supplmental nutrition  Pharmacy Interventions   Pharmacy Dicussed/Reviewed Pharmacy Topics Discussed, Medications and their functions  Safety Interventions   Safety Discussed/Reviewed Safety Discussed             SDOH assessments and  interventions completed:  Yes  SDOH Interventions Today    Flowsheet Row Most Recent Value  SDOH Interventions   Food Insecurity Interventions Intervention Not Indicated  Housing Interventions Intervention Not Indicated  Transportation Interventions Intervention Not Indicated        Care Coordination Interventions:  Yes, provided   Follow up plan: Follow up call scheduled for April 22    Encounter Outcome:  Pt. Visit Completed   Bary Leriche, RN, MSN [facility] Care Management Care Management Coordinator Direct Line [phone]

## 2022-12-26 NOTE — Patient Instructions (Signed)
Visit Information  Thank you for taking time to visit with me today. Please don't hesitate to contact me if I can be of assistance to you.   Following are the goals we discussed today:   Goals Addressed             This Visit's Progress    " Getting Stronger"       Patient Goals/Self Care Activities: -Patient/Caregiver will self-administer medications as prescribed as evidenced by self-report/primary caregiver report  -Patient/Caregiver will attend all scheduled provider appointments as evidenced by clinician review of documented attendance to scheduled appointments and patient/caregiver report -Patient/Caregiver will call provider office for new concerns or questions as evidenced by review of documented incoming telephone call notes and patient report   Patient wanting a referral to Delbert Harness for evaluation.  Patient with history of cancer that is now in remission. Patient wants to get stronger and work on knee and shoulder issues. Message sent to PCP for request.  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]  General Interventions   General Interventions Discussed/Reviewed General Interventions Discussed  Education Interventions   Education Provided Provided Education  Provided Verbal Education On Nutrition, Exercise, Medication  Mental Health Interventions   Mental Health Discussed/Reviewed Mental Health Discussed, Depression  Nutrition Interventions   Nutrition Discussed/Reviewed Nutrition Discussed, Supplmental nutrition  Pharmacy Interventions   Pharmacy Dicussed/Reviewed Pharmacy Topics Discussed, Medications and their functions  Safety Interventions   Safety Discussed/Reviewed Safety Discussed             Our next appointment is by telephone on 01/09/23 at 1:00 pm  Please call the care guide team at [phone] if you need to cancel or reschedule your appointment.   If  you are experiencing a Mental Health or Behavioral Health Crisis or need someone to talk to, please call the Suicide and Crisis Lifeline: 988   Patient verbalizes understanding of instructions and care plan provided today and agrees to view in MyChart. Active MyChart status and patient understanding of how to access instructions and care plan via MyChart confirmed with patient.     The patient has been provided with contact information for the care management team and has been advised to call with any health related questions or concerns.   Bary Leriche, RN, MSN [facility] Care Management Care Management Coordinator Direct Line [phone]

## 2022-12-30 ENCOUNTER — Other Ambulatory Visit: Payer: Self-pay

## 2022-12-30 DIAGNOSIS — M25511 Pain in right shoulder: Secondary | ICD-10-CM

## 2023-01-04 ENCOUNTER — Other Ambulatory Visit: Payer: Self-pay

## 2023-01-04 DIAGNOSIS — M19011 Primary osteoarthritis, right shoulder: Secondary | ICD-10-CM | POA: Diagnosis not present

## 2023-01-04 DIAGNOSIS — M1711 Unilateral primary osteoarthritis, right knee: Secondary | ICD-10-CM | POA: Diagnosis not present

## 2023-01-09 ENCOUNTER — Ambulatory Visit: Payer: Self-pay

## 2023-01-09 NOTE — Patient Instructions (Signed)
Visit Information  Thank you for taking time to visit with me today. Please don't hesitate to contact me if I can be of assistance to you.   Following are the goals we discussed today:   Goals Addressed             This Visit's Progress    " Getting Stronger"       Patient Goals/Self Care Activities: -Patient/Caregiver will self-administer medications as prescribed as evidenced by self-report/primary caregiver report  -Patient/Caregiver will attend all scheduled provider appointments as evidenced by clinician review of documented attendance to scheduled appointments and patient/caregiver report -Patient/Caregiver will call provider office for new concerns or questions as evidenced by review of documented incoming telephone call notes and patient report   Patient saw Ashley Moore for evaluation.  Possible gel injection to knee and cortisone shot to arm.  Waiting to discuss with oncologist. 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 Patient wants to get stronger and work on knee and shoulder issues. Message sent to PCP for request.  Interventions Today    Flowsheet Row Most Recent Value  Chronic Disease   Chronic disease during today's visit Other  [anal cancer]  General Interventions   General Interventions Discussed/Reviewed General Interventions Reviewed, Doctor Visits  Doctor Visits Discussed/Reviewed Specialist  PCP/Specialist Visits --  Ashley Moore seen by Ashley Moore  Education Interventions   Education Provided Provided Education  Provided Verbal Education On Nutrition, Exercise  Nutrition Interventions   Nutrition Discussed/Reviewed Nutrition Reviewed, Supplmental nutrition, Adding fruits and vegetables  [Patient eating organic foods]             Our next appointment is by telephone on 02/02/23 at 1100  Please call the care guide team at [phone] if you need to cancel or reschedule your appointment.   If you are experiencing a  Mental Health or Behavioral Health Crisis or need someone to talk to, please call the Suicide and Crisis Lifeline: 988   Patient verbalizes understanding of instructions and care plan provided today and agrees to view in MyChart. Active MyChart status and patient understanding of how to access instructions and care plan via MyChart confirmed with patient.     The patient has been provided with contact information for the care management team and has been advised to call with any health related questions or concerns.   Ashley Leriche, RN, MSN [facility] Care Management Care Management Coordinator Direct Line [phone]

## 2023-01-09 NOTE — Patient Outreach (Signed)
  Care Coordination   Follow Up Visit Note   01/09/2023 Name: AVONNA IRIBE MRN: [id number] DOB: [birth year]  Ashley Moore is a 72 y.o. year old female who sees Panosh, Neta Mends, MD for primary care. I spoke with  [name] by phone today. Patient continues to get stronger. Doing chair yoga.    What matters to the patients health and wellness today?  Getting stronger    Goals Addressed             This Visit's Progress    " Getting Stronger"       Patient Goals/Self Care Activities: -Patient/Caregiver will self-administer medications as prescribed as evidenced by self-report/primary caregiver report  -Patient/Caregiver will attend all scheduled provider appointments as evidenced by clinician review of documented attendance to scheduled appointments and patient/caregiver report -Patient/Caregiver will call provider office for new concerns or questions as evidenced by review of documented incoming telephone call notes and patient report   Patient saw Delbert Harness for evaluation.  Possible gel injection to knee and cortisone shot to arm.  Waiting to discuss with oncologist. 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 Patient wants to get stronger and work on knee and shoulder issues. Message sent to PCP for request.  Interventions Today    Flowsheet Row Most Recent Value  Chronic Disease   Chronic disease during today's visit Other  [anal cancer]  General Interventions   General Interventions Discussed/Reviewed General Interventions Reviewed, Doctor Visits  Doctor Visits Discussed/Reviewed Specialist  PCP/Specialist Visits --  Georgann Housekeeper seen by Delbert Harness  Education Interventions   Education Provided Provided Education  Provided Verbal Education On Nutrition, Exercise  Nutrition Interventions   Nutrition Discussed/Reviewed Nutrition Reviewed, Supplmental nutrition, Adding fruits and vegetables  [Patient eating organic foods]              SDOH assessments and interventions completed:  Yes     Care Coordination Interventions:  Yes, provided   Follow up plan: Follow up call scheduled for May    Encounter Outcome:  Pt. Visit Completed   Bary Leriche, RN, MSN [facility] Care Management Care Management Coordinator Direct Line [phone]

## 2023-01-12 ENCOUNTER — Inpatient Hospital Stay: Payer: Medicare HMO | Attending: Nurse Practitioner

## 2023-01-12 ENCOUNTER — Inpatient Hospital Stay: Payer: Medicare HMO

## 2023-01-12 ENCOUNTER — Inpatient Hospital Stay: Payer: Medicare HMO | Admitting: Oncology

## 2023-01-12 VITALS — BP 130/80 | HR 67 | Resp 18

## 2023-01-12 VITALS — BP 121/73 | HR 70 | Temp 98.2°F | Resp 18 | Ht 62.0 in | Wt 168.8 lb

## 2023-01-12 DIAGNOSIS — C772 Secondary and unspecified malignant neoplasm of intra-abdominal lymph nodes: Secondary | ICD-10-CM | POA: Diagnosis not present

## 2023-01-12 DIAGNOSIS — C210 Malignant neoplasm of anus, unspecified: Secondary | ICD-10-CM

## 2023-01-12 DIAGNOSIS — N390 Urinary tract infection, site not specified: Secondary | ICD-10-CM | POA: Insufficient documentation

## 2023-01-12 DIAGNOSIS — Z5112 Encounter for antineoplastic immunotherapy: Secondary | ICD-10-CM | POA: Diagnosis not present

## 2023-01-12 LAB — URINALYSIS, COMPLETE (UACMP) WITH MICROSCOPIC
Bacteria, UA: NONE SEEN
Bilirubin Urine: NEGATIVE
Glucose, UA: NEGATIVE mg/dL
Ketones, ur: NEGATIVE mg/dL
Nitrite: NEGATIVE
Specific Gravity, Urine: 1.009 (ref 1.005–1.030)
WBC, UA: >50 WBC/hpf (ref 0–5)
pH: 6.5 (ref 5.0–8.0)

## 2023-01-12 LAB — CBC WITH DIFFERENTIAL (CANCER CENTER ONLY)
Abs Immature Granulocytes: 0.05 10*3/uL (ref 0.00–0.07)
Basophils Absolute: 0 10*3/uL (ref 0.0–0.1)
Basophils Relative: 1 %
Eosinophils Absolute: 0.2 10*3/uL (ref 0.0–0.5)
Eosinophils Relative: 4 %
HCT: 37.7 % (ref 36.0–46.0)
Hemoglobin: 12.1 g/dL (ref 12.0–15.0)
Immature Granulocytes: 1 %
Lymphocytes Relative: 17 %
Lymphs Abs: 0.8 10*3/uL (ref 0.7–4.0)
MCH: 29 pg (ref 26.0–34.0)
MCHC: 32.1 g/dL (ref 30.0–36.0)
MCV: 90.4 fL (ref 80.0–100.0)
Monocytes Absolute: 0.5 10*3/uL (ref 0.1–1.0)
Monocytes Relative: 10 %
Neutro Abs: 3.3 10*3/uL (ref 1.7–7.7)
Neutrophils Relative %: 67 %
Platelet Count: 225 10*3/uL (ref 150–400)
RBC: 4.17 MIL/uL (ref 3.87–5.11)
RDW: 14.2 % (ref 11.5–15.5)
WBC Count: 4.9 10*3/uL (ref 4.0–10.5)
nRBC: 0 % (ref 0.0–0.2)

## 2023-01-12 LAB — CMP (CANCER CENTER ONLY)
ALT: 23 U/L (ref 0–44)
AST: 34 U/L (ref 15–41)
Albumin: 3.9 g/dL (ref 3.5–5.0)
Alkaline Phosphatase: 80 U/L (ref 38–126)
Anion gap: 10 (ref 5–15)
BUN: 16 mg/dL (ref 8–23)
CO2: 22 mmol/L (ref 22–32)
Calcium: 9.1 mg/dL (ref 8.9–10.3)
Chloride: 103 mmol/L (ref 98–111)
Creatinine: 0.86 mg/dL (ref 0.44–1.00)
GFR, Estimated: >60 mL/min (ref >60)
Glucose, Bld: 102 mg/dL — ABNORMAL HIGH (ref 70–99)
Potassium: 4.5 mmol/L (ref 3.5–5.1)
Sodium: 135 mmol/L (ref 135–145)
Total Bilirubin: 0.3 mg/dL (ref 0.3–1.2)
Total Protein: 6.8 g/dL (ref 6.5–8.1)

## 2023-01-12 MED ORDER — SODIUM CHLORIDE 0.9 % IV SOLN
480.0000 mg | Freq: Once | INTRAVENOUS | Status: AC
  Administered 2023-01-12: 480 mg via INTRAVENOUS
  Filled 2023-01-12: qty 48

## 2023-01-12 MED ORDER — LORAZEPAM 0.5 MG PO TABS
0.5000 mg | ORAL_TABLET | Freq: Three times a day (TID) | ORAL | 0 refills | Status: DC | PRN
Start: 2023-01-12 — End: 2023-03-27

## 2023-01-12 MED ORDER — SODIUM CHLORIDE 0.9 % IV SOLN: Freq: Once | INTRAVENOUS | Status: AC

## 2023-01-12 MED ORDER — SULFAMETHOXAZOLE-TRIMETHOPRIM 800-160 MG PO TABS: 1.0000 | ORAL_TABLET | Freq: Two times a day (BID) | ORAL | 0 refills | Status: DC

## 2023-01-12 NOTE — Patient Instructions (Signed)
[facility]   Discharge Instructions: Thank you for choosing [facility] to provide your oncology and hematology care.   If you have a lab appointment with the Cancer Center, please go directly to the Cancer Center and check in at the registration area.   Wear comfortable clothing and clothing appropriate for easy access to any Portacath or PICC line.   We strive to give you quality time with your provider. You may need to reschedule your appointment if you arrive late (15 or more minutes).  Arriving late affects you and other patients whose appointments are after yours.  Also, if you miss three or more appointments without notifying the office, you may be dismissed from the clinic at the provider's discretion.      For prescription refill requests, have your pharmacy contact our office and allow 72 hours for refills to be completed.    Today you received the following chemotherapy and/or immunotherapy agents Nivolumab (OPDIVO).      To help prevent nausea and vomiting after your treatment, we encourage you to take your nausea medication as directed.  BELOW ARE SYMPTOMS THAT SHOULD BE REPORTED IMMEDIATELY: *FEVER GREATER THAN 100.4 F (38 C) OR HIGHER *CHILLS OR SWEATING *NAUSEA AND VOMITING THAT IS NOT CONTROLLED WITH YOUR NAUSEA MEDICATION *UNUSUAL SHORTNESS OF BREATH *UNUSUAL BRUISING OR BLEEDING *URINARY PROBLEMS (pain or burning when urinating, or frequent urination) *BOWEL PROBLEMS (unusual diarrhea, constipation, pain near the anus) TENDERNESS IN MOUTH AND THROAT WITH OR WITHOUT PRESENCE OF ULCERS (sore throat, sores in mouth, or a toothache) UNUSUAL RASH, SWELLING OR PAIN  UNUSUAL VAGINAL DISCHARGE OR ITCHING   Items with * indicate a potential emergency and should be followed up as soon as possible or go to the Emergency Department if any problems should occur.  Please show the CHEMOTHERAPY ALERT CARD or IMMUNOTHERAPY ALERT CARD  at check-in to the Emergency Department and triage nurse.  Should you have questions after your visit or need to cancel or reschedule your appointment, please contact [facility]  Dept: [phone]  and follow the prompts.  Office hours are 8:00 a.m. to 4:30 p.m. Monday - Friday. Please note that voicemails left after 4:00 p.m. may not be returned until the following business day.  We are closed weekends and major holidays. You have access to a nurse at all times for urgent questions. Please call the main number to the clinic Dept: [phone] and follow the prompts.   For any non-urgent questions, you may also contact your provider using MyChart. We now offer e-Visits for anyone 18 and older to request care online for non-urgent symptoms. For details visit [email or web address].   Also download the MyChart app! Go to the app store, search "MyChart", open the app, select [facility], and log in with your MyChart username and password.  Nivolumab Injection What is this medication? NIVOLUMAB (nye VOL ue mab) treats some types of cancer. It works by helping your immune system slow or stop the spread of cancer cells. It is a monoclonal antibody. This medicine may be used for other purposes; ask your health care provider or pharmacist if you have questions. COMMON BRAND NAME(S): Opdivo What should I tell my care team before I take this medication? They need to know if you have any of these conditions: Allogeneic stem cell transplant (uses someone else's stem cells) Autoimmune diseases, such as Crohn disease, ulcerative colitis, lupus History of chest radiation Nervous system   problems, such as Guillain-Barre syndrome or myasthenia gravis Organ transplant An unusual or allergic reaction to nivolumab, other medications, foods, dyes, or preservatives Pregnant or trying to get pregnant Breast-feeding How should I use this medication? This medication is infused  into a vein. It is given in a hospital or clinic setting. A special MedGuide will be given to you before each treatment. Be sure to read this information carefully each time. Talk to your care team about the use of this medication in children. While it may be prescribed for children as young as 12 years for selected conditions, precautions do apply. Overdosage: If you think you have taken too much of this medicine contact a poison control center or emergency room at once. NOTE: This medicine is only for you. Do not share this medicine with others. What if I miss a dose? Keep appointments for follow-up doses. It is important not to miss your dose. Call your care team if you are unable to keep an appointment. What may interact with this medication? Interactions have not been studied. This list may not describe all possible interactions. Give your health care provider a list of all the medicines, herbs, non-prescription drugs, or dietary supplements you use. Also tell them if you smoke, drink alcohol, or use illegal drugs. Some items may interact with your medicine. What should I watch for while using this medication? Your condition will be monitored carefully while you are receiving this medication. You may need blood work while taking this medication. This medication may cause serious skin reactions. They can happen weeks to months after starting the medication. Contact your care team right away if you notice fevers or flu-like symptoms with a rash. The rash may be red or purple and then turn into blisters or peeling of the skin. You may also notice a red rash with swelling of the face, lips, or lymph nodes in your neck or under your arms. Tell your care team right away if you have any change in your eyesight. Talk to your care team if you are pregnant or think you might be pregnant. A negative pregnancy test is required before starting this medication. A reliable form of contraception is recommended  while taking this medication and for 5 months after the last dose. Talk to your care team about effective forms of contraception. Do not breast-feed while taking this medication and for 5 months after the last dose. What side effects may I notice from receiving this medication? Side effects that you should report to your care team as soon as possible: Allergic reactions--skin rash, itching, hives, swelling of the face, lips, tongue, or throat Dry cough, shortness of breath or trouble breathing Eye pain, redness, irritation, or discharge with blurry or decreased vision Heart muscle inflammation--unusual weakness or fatigue, shortness of breath, chest pain, fast or irregular heartbeat, dizziness, swelling of the ankles, feet, or hands Hormone gland problems--headache, sensitivity to light, unusual weakness or fatigue, dizziness, fast or irregular heartbeat, increased sensitivity to cold or heat, excessive sweating, constipation, hair loss, increased thirst or amount of urine, tremors or shaking, irritability Infusion reactions--chest pain, shortness of breath or trouble breathing, feeling faint or lightheaded Kidney injury (glomerulonephritis)--decrease in the amount of urine, red or dark brown urine, foamy or bubbly urine, swelling of the ankles, hands, or feet Liver injury--right upper belly pain, loss of appetite, nausea, light-colored stool, dark yellow or brown urine, yellowing skin or eyes, unusual weakness or fatigue Pain, tingling, or numbness in the hands or feet,   muscle weakness, change in vision, confusion or trouble speaking, loss of balance or coordination, trouble walking, seizures Rash, fever, and swollen lymph nodes Redness, blistering, peeling, or loosening of the skin, including inside the mouth Sudden or severe stomach pain, bloody diarrhea, fever, nausea, vomiting Side effects that usually do not require medical attention (report these to your care team if they continue or are  bothersome): Bone, joint, or muscle pain Diarrhea Fatigue Loss of appetite Nausea Skin rash This list may not describe all possible side effects. Call your doctor for medical advice about side effects. You may report side effects to FDA at 1-800-FDA-1088. Where should I keep my medication? This medication is given in a hospital or clinic. It will not be stored at home. NOTE: This sheet is a summary. It may not cover all possible information. If you have questions about this medicine, talk to your doctor, pharmacist, or health care provider.  2023 Elsevier/Gold Standard (2014-07-28 00:00:00)  

## 2023-01-12 NOTE — Progress Notes (Signed)
Patient seen by Dr. Sherrill today ? ?Vitals are within treatment parameters. ? ?Labs reviewed by Dr. Sherrill and are within treatment parameters. ? ?Per physician team, patient is ready for treatment and there are NO modifications to the treatment plan.  ?

## 2023-01-12 NOTE — Progress Notes (Signed)
[facility] OFFICE PROGRESS NOTE   Diagnosis: Anal cancer  INTERVAL HISTORY:   Ashley Moore is as scheduled.  She completed another treatment with nivolumab on 12/14/2022.  She feels well.  Good appetite.  She has noted a firm tender area at the medial aspect of the left lower leg for the past few months.  She has mild edema in both legs.  She reports burning with urination for the past few weeks.  She feels that she has a urinary tract infection.  Objective:  Vital signs in last 24 hours:  Blood pressure 121/73, pulse 70, temperature 98.2 F (36.8 C), temperature source Oral, resp. rate 18, height 5\' 2"  (1.575 m), weight 168 lb 12.8 oz (76.6 kg), SpO2 100 %.     Lymphatics: No cervical, supraclavicular, axillary, or inguinal nodes Resp: Lungs with inspiratory rales at the lower posterior chest bilaterally, no respiratory distress Cardio: Regular rate and rhythm GI: No hepatosplenomegaly Vascular: Trace edema at the lower leg bilaterally.  There is a firm area at the lower medial left leg just above the ankle spanning approximately 5-6 cm with faint erythema.  Mild tenderness.  No palpable cord.  The leg size appears symmetric.    Lab Results:  Lab Results  Component Value Date   WBC 4.9 01/12/2023   HGB 12.1 01/12/2023   HCT 37.7 01/12/2023   MCV 90.4 01/12/2023   PLT 225 01/12/2023   NEUTROABS 3.3 01/12/2023    CMP  Lab Results  Component Value Date   NA 135 01/12/2023   K 4.5 01/12/2023   CL 103 01/12/2023   CO2 22 01/12/2023   GLUCOSE 102 (H) 01/12/2023   BUN 16 01/12/2023   CREATININE 0.86 01/12/2023   CALCIUM 9.1 01/12/2023   PROT 6.8 01/12/2023   ALBUMIN 3.9 01/12/2023   AST 34 01/12/2023   ALT 23 01/12/2023   ALKPHOS 80 01/12/2023   BILITOT 0.3 01/12/2023   GFRNONAA >60 01/12/2023   GFRAA >60 06/17/2020    Lab Results  Component Value Date   CEA1 1.15 03/04/2021   CEA 1.16 03/04/2021    Lab Results  Component Value Date   INR  1.2 05/16/2021   LABPROT 15.2 05/16/2021    Imaging:  No results found.  Medications: I have reviewed the patient's current medications.   Assessment/Plan: Anal cancer CT abdomen/pelvis 10/21/2016-thickening of the anus extending to the junction between the anus and rectum with a large stool ball in the rectum and mild fat stranding posterior to the rectum. Enlarged lymph nodes in the right and left inguinal regions. A few mildly prominent nodes seen posterior to the rectum. 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 PET scan 11/10/2016-markedly hypermetabolic anal mass with hypermetabolic metastases to the groin region bilaterally, left pelvic sidewall and presacral space. Initiation of radiation and cycle 1 5-FU/mitomycin C 11/14/2016 Cycle 2 5-FU/mitomycin C 12/12/2016 (5-FU dose reduced due to mucositis, diarrhea, skin breakdown) Radiation completed 12/23/2016 CT abdomen/pelvis 04/14/2017-resolution of anal mass and bilateral inguinal lymphadenopathy. No residual tumor seen. CT abdomen/pelvis 01/28/2020-right hydroureteronephrosis, transition at the level of the mid right ureter, retroperitoneal lymphadenopathy CT renal stone study 01/28/2020-severe right hydronephrosis and proximal right hydroureter, 8 mm area of increased attenuation in the proximal right ureter-stone versus soft tissue mass PET 02/13/2020-hypermetabolic right retroperitoneal nodal metastases in the aortocaval and posterior pericaval chains, nonspecific mild anal wall hypermetabolism, right nephroureteral stent 02/21/2020 anorectal exam per Dr. Theadora Rama radiation changes of the skin around the anal margin.  Posterior midline smooth scarring.  Mildly stenotic.  Stenosis seems better.  No palpable concerning lesions.  Entire anal canal and distal rectum feels smooth and healthy.  Partial anoscopy performed with no lesions in the anal canal. Xeloda/radiation beginning 03/02/2020, completed  04/13/2020 CT abdomen/pelvis 06/17/2020-resolution of right retroperitoneal lymphadenopathy, no remaining pathologically enlarged lymph nodes, residual mild right hydronephrosis, no evidence of progressive disease, stable anal wall thickening Digital rectal exam by Dr. Roxan Hockey 08/26/2020-posterior midline smooth scarring.  Mildly stenotic.  Stenosis seems better.  No palpable concerning lesions.  Anal canal feels smooth without palpable concern.  Unable to tolerate anoscopy.  Next visit in 6 months for surveillance. CT abdomen/pelvis 10/28/2020-no evidence of local recurrence or metastatic disease.  Stable mild rectal wall thickening.  New diffuse bladder wall thickening possibly related to prior radiation CT abdomen/pelvis 01/16/2021- recurrent right-sided hydronephrosis and proximal right hydroureter status post removal of right ureter stent, new soft tissue nodule along the course of the right ureter between the right psoas and IVC suspicious for recurrent lymphadenopathy PET 02/12/2021-small focus of hypermetabolism at the anorectal junction without a definable mass, right retroperitoneal nodal metastasis with obstruction of the proximal right ureter, right middle lobe hypermetabolic nodule, hypermetabolism in the right hilum without a defined nodal mass, hypermetabolic left supraclavicular and left axillary nodes 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 Cycle 1 Taxol/carboplatin 03/11/2021 Cycle 2 Taxol/carboplatin 03/31/2021, Fulphila Cycle 3 Taxol/carboplatin 04/21/2021, Fulphila CTs 05/11/2021-decrease size of FDG avid nodule in right middle lobe, right middle lobe subpleural nodule slightly enlarged, decreased in size of left axillary and subpectoral nodes, unchanged FDG avid right iliac nodes Cycle 4 Taxol/carboplatin 05/12/2021, G-CSF discontinued secondary to poor tolerance, Taxol and carboplatin dose reduced Cycle 5 Taxol/carboplatin  06/02/2021 Cycle 6 carboplatin 07/01/2021, Taxol held due to neuropathy Cycle 7 carboplatin 07/29/2021, Taxol held due to neuropathy CTs 08/16/2021-new liver lesion, increase in right psoas lymph node, unchanged right hydronephrosis, new mediastinal and right hilar lymphadenopathy, enlargement of bilateral pulmonary nodules and at least one new nodule, new subcarinal node Cycle 1 nivolumab 08/25/2021 Cycle 2 nivolumab 09/08/2021 Cycle 3 nivolumab 09/22/2021 Cycle 4 nivolumab 10/06/2021 Cycle 5 nivolumab 10/20/2021 Cycle 6 nivolumab 11/04/2021 CTs 11/12/2021-resolution of lung nodules, decreased size of right psoas node, no remaining mediastinal lymphadenopathy, resolution of right liver lesion Cycle 7 nivolumab 11/17/2021 Cycle 8 nivolumab 12/01/2021 Cycle 9 nivolumab 12/15/2021 Cycle 10 nivolumab 12/30/2021 Cycle 11 nivolumab 01/12/2022 Cycle 12 nivolumab 5/10/211-monthly dosing Cycle 13 nivolumab 02/23/2022 CT 03/16/2022-stable right psoas mass, no pulmonary nodules, persistent circumferential anal thickening, unchanged subcentimeter left liver lesion Cycle 14 nivolumab 03/24/2022 Cycle 15 nivolumab 04/21/2022 Cycle 16 nivolumab 05/19/2022 Cycle 17 nivolumab 06/16/2022 Cycle 18 nivolumab 07/14/2022 CTs 08/16/2022-stable ill-defined soft tissue in the right retroperitoneum, very mild intra and EXTR hepatic biliary duct dilation, common bile duct dilated to 11 mm in the porta hepatis Cycle 19 nivolumab 08/18/2022 Cycle 20 nivolumab 09/15/2022 Cycle 21 nivolumab 10/18/2022 Cycle 22 nivolumab 11/15/2022 Cycle 23 nivolumab 12/14/2022 Left labial lesions. Question direct extension from the anal cancer versus metastatic disease from anal cancer versus a separate malignant process. 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 Multiple family members with breast cancer. History of hypokalemia-likely secondary to  decreased nutritional intake and diarrhea; potassium in normal range 01/16/2017. No longer taking a potassium supplement. Right hydroureteronephrosis on CT 01/28/2020, status post a cystoscopy/pyelogram 01/28/2020 confirming extrinsic compression of the right ureter,  status post stent placement Right ureter stent exchange 06/12/2020 Right ureter stent removed 01/13/2021   8.  Admission 01/07/2021 with Klebsiella pneumoniae urosepsis/bacteremia  9.  Hospital admission 05/15/2021 with UTI/possible right pyelonephritis    Disposition: Ashley Moore is in clinical remission from anal cancer.  She is tolerating the nivolumab well.  The urinalysis today is consistent with a urinary tract infection.  She will complete a course of Bactrim.  Ashley Moore has chronic swelling in the lower legs, though this is much improved compared to 2 years ago.  The firm tender area at the left lower leg is likely related to chronic stasis change.  We will check a Doppler to rule out venous thrombosis.  Ashley Moore will undergo restaging CTs prior to an office visit in 1 month.  Thornton Papas, MD  01/12/2023  12:54 PM

## 2023-01-13 ENCOUNTER — Other Ambulatory Visit: Payer: Self-pay

## 2023-01-13 ENCOUNTER — Ambulatory Visit (INDEPENDENT_AMBULATORY_CARE_PROVIDER_SITE_OTHER): Payer: Medicare HMO

## 2023-01-13 DIAGNOSIS — C210 Malignant neoplasm of anus, unspecified: Secondary | ICD-10-CM | POA: Diagnosis not present

## 2023-01-13 DIAGNOSIS — M7989 Other specified soft tissue disorders: Secondary | ICD-10-CM

## 2023-01-14 ENCOUNTER — Other Ambulatory Visit: Payer: Self-pay

## 2023-01-16 DIAGNOSIS — M1711 Unilateral primary osteoarthritis, right knee: Secondary | ICD-10-CM | POA: Diagnosis not present

## 2023-01-19 DIAGNOSIS — H0288A Meibomian gland dysfunction right eye, upper and lower eyelids: Secondary | ICD-10-CM | POA: Diagnosis not present

## 2023-01-19 DIAGNOSIS — H0288B Meibomian gland dysfunction left eye, upper and lower eyelids: Secondary | ICD-10-CM | POA: Diagnosis not present

## 2023-01-19 DIAGNOSIS — H16223 Keratoconjunctivitis sicca, not specified as Sjogren's, bilateral: Secondary | ICD-10-CM | POA: Diagnosis not present

## 2023-01-19 DIAGNOSIS — H16233 Neurotrophic keratoconjunctivitis, bilateral: Secondary | ICD-10-CM | POA: Diagnosis not present

## 2023-01-23 DIAGNOSIS — M1711 Unilateral primary osteoarthritis, right knee: Secondary | ICD-10-CM | POA: Diagnosis not present

## 2023-01-24 ENCOUNTER — Telehealth: Payer: Self-pay

## 2023-01-24 ENCOUNTER — Other Ambulatory Visit: Payer: Self-pay

## 2023-01-24 DIAGNOSIS — C210 Malignant neoplasm of anus, unspecified: Secondary | ICD-10-CM

## 2023-01-24 NOTE — Telephone Encounter (Signed)
A patient called to report ongoing symptoms consistent with a UTI, including burning, itching, and urgency to urinate. She requested a refill of her antibiotic medication, stating that her symptoms have only partially improved and she needs a few more days of treatment. I advised the patient to schedule a lab appointment to collect a urine sample for further testing. The patient agreed and has scheduled to come in for the appointment.

## 2023-01-25 ENCOUNTER — Other Ambulatory Visit: Payer: Self-pay

## 2023-01-25 ENCOUNTER — Inpatient Hospital Stay: Payer: Medicare HMO | Attending: Nurse Practitioner

## 2023-01-25 DIAGNOSIS — C770 Secondary and unspecified malignant neoplasm of lymph nodes of head, face and neck: Secondary | ICD-10-CM | POA: Diagnosis not present

## 2023-01-25 DIAGNOSIS — R300 Dysuria: Secondary | ICD-10-CM | POA: Insufficient documentation

## 2023-01-25 DIAGNOSIS — Z79620 Long term (current) use of immunosuppressive biologic: Secondary | ICD-10-CM | POA: Insufficient documentation

## 2023-01-25 DIAGNOSIS — C772 Secondary and unspecified malignant neoplasm of intra-abdominal lymph nodes: Secondary | ICD-10-CM

## 2023-01-25 DIAGNOSIS — C210 Malignant neoplasm of anus, unspecified: Secondary | ICD-10-CM | POA: Diagnosis not present

## 2023-01-25 DIAGNOSIS — Z5112 Encounter for antineoplastic immunotherapy: Secondary | ICD-10-CM | POA: Diagnosis not present

## 2023-01-25 LAB — URINALYSIS, COMPLETE (UACMP) WITH MICROSCOPIC
Bilirubin Urine: NEGATIVE
Glucose, UA: NEGATIVE mg/dL
Ketones, ur: NEGATIVE mg/dL
Nitrite: NEGATIVE
Protein, ur: 30 mg/dL — AB
Specific Gravity, Urine: 1.009 (ref 1.005–1.030)
WBC, UA: >50 WBC/hpf (ref 0–5)
pH: 5.5 (ref 5.0–8.0)

## 2023-01-26 LAB — URINE CULTURE: Culture: NO GROWTH

## 2023-01-30 DIAGNOSIS — M1711 Unilateral primary osteoarthritis, right knee: Secondary | ICD-10-CM | POA: Diagnosis not present

## 2023-01-31 ENCOUNTER — Telehealth: Payer: Self-pay | Admitting: Internal Medicine

## 2023-01-31 NOTE — Telephone Encounter (Signed)
Prescription Request 01/31/2023  LOV:  Saw Dr. [place] on 06/17/22:  Acute // UTI  LOV with Dr. Fabian Sharp = October 25, 2021   What is the name of the medication or equipment?   levothyroxine (SYNTHROID) 75 MCG tablet  Pt states she has less than a week's worth left.  [facility]  Have you contacted your pharmacy to request a refill? Yes   Which pharmacy would you like this sent to?   [id number] [place], [place] - [address] [place] [address] Phone: [phone] Fax: [phone]  Patient notified that their request is being sent to the clinical staff for review and that they should receive a response within 2 business days.   Please advise at Mobile [phone] (mobile)

## 2023-02-01 ENCOUNTER — Telehealth: Payer: Self-pay

## 2023-02-01 MED ORDER — LEVOTHYROXINE SODIUM 75 MCG PO TABS: 75.0000 ug | ORAL_TABLET | Freq: Every day | ORAL | 1 refills | Status: DC

## 2023-02-01 NOTE — Telephone Encounter (Signed)
Ok to refill thryoid medication for  6 months

## 2023-02-01 NOTE — Telephone Encounter (Signed)
A patient has reported experiencing symptoms consistent with a urinary tract infection, including burning and itching. They are currently awaiting a prescription to be sent in.

## 2023-02-01 NOTE — Telephone Encounter (Signed)
Rx sent 

## 2023-02-02 ENCOUNTER — Ambulatory Visit: Payer: Self-pay

## 2023-02-02 NOTE — Patient Outreach (Signed)
  Care Coordination   Follow Up Visit Note   02/02/2023 Name: SHEKIRA KRATZER MRN: [id number] DOB: [birth year]  MALYN MEDEROS is a 72 y.o. year old female who sees Panosh, Neta Mends, MD for primary care. I spoke with  [name] by phone today.  What matters to the patients health and wellness today?  Getting better    Goals Addressed             This Visit's Progress    " Getting Stronger"       Patient Goals/Self Care Activities: -Patient/Caregiver will self-administer medications as prescribed as evidenced by self-report/primary caregiver report  -Patient/Caregiver will attend all scheduled provider appointments as evidenced by clinician review of documented attendance to scheduled appointments and patient/caregiver report -Patient/Caregiver will call provider office for new concerns or questions as evidenced by review of documented incoming telephone call notes and patient report     Patient doing better.  Patient has had 3 gel injection to knee.  Recent UTI.  Discussed UTI and worsening.  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 Patient wants to get stronger and work on knee and shoulder issues.            SDOH assessments and interventions completed:  Yes     Care Coordination Interventions:  Yes, provided   Follow up plan: Follow up call scheduled for June    Encounter Outcome:  Pt. Visit Completed   Bary Leriche, RN, MSN [facility] Care Management Care Management Coordinator Direct Line [phone]

## 2023-02-02 NOTE — Patient Instructions (Signed)
Visit Information  Thank you for taking time to visit with me today. Please don't hesitate to contact me if I can be of assistance to you.   Following are the goals we discussed today:   Goals Addressed             This Visit's Progress    " Getting Stronger"       Patient Goals/Self Care Activities: -Patient/Caregiver will self-administer medications as prescribed as evidenced by self-report/primary caregiver report  -Patient/Caregiver will attend all scheduled provider appointments as evidenced by clinician review of documented attendance to scheduled appointments and patient/caregiver report -Patient/Caregiver will call provider office for new concerns or questions as evidenced by review of documented incoming telephone call notes and patient report     Patient doing better.  Patient has had 3 gel injection to knee.  Recent UTI.  Discussed UTI and worsening.  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 Patient wants to get stronger and work on knee and shoulder issues.            Our next appointment is by telephone on 03/02/23 at 1200  Please call the care guide team at [phone] if you need to cancel or reschedule your appointment.   If you are experiencing a Mental Health or Behavioral Health Crisis or need someone to talk to, please call the Suicide and Crisis Lifeline: 988   Patient verbalizes understanding of instructions and care plan provided today and agrees to view in MyChart. Active MyChart status and patient understanding of how to access instructions and care plan via MyChart confirmed with patient.     The patient has been provided with contact information for the care management team and has been advised to call with any health related questions or concerns.   Bary Leriche, RN, MSN [facility] Care Management Care Management Coordinator Direct Line [phone]

## 2023-02-07 ENCOUNTER — Ambulatory Visit (HOSPITAL_BASED_OUTPATIENT_CLINIC_OR_DEPARTMENT_OTHER)
Admission: RE | Admit: 2023-02-07 | Discharge: 2023-02-07 | Disposition: A | Discharge status: Home / Self Care | Payer: Medicare HMO | Source: Ambulatory Visit | Attending: Oncology | Admitting: Oncology

## 2023-02-07 DIAGNOSIS — C210 Malignant neoplasm of anus, unspecified: Secondary | ICD-10-CM | POA: Diagnosis not present

## 2023-02-07 DIAGNOSIS — N1330 Unspecified hydronephrosis: Secondary | ICD-10-CM | POA: Diagnosis not present

## 2023-02-07 DIAGNOSIS — J479 Bronchiectasis, uncomplicated: Secondary | ICD-10-CM | POA: Diagnosis not present

## 2023-02-07 MED ORDER — IOHEXOL 300 MG/ML  SOLN
100.0000 mL | Freq: Once | INTRAMUSCULAR | Status: AC | PRN
  Administered 2023-02-07: 75 mL via INTRAVENOUS

## 2023-02-09 ENCOUNTER — Encounter: Payer: Self-pay | Admitting: *Deleted

## 2023-02-09 ENCOUNTER — Inpatient Hospital Stay: Payer: Medicare HMO

## 2023-02-09 ENCOUNTER — Inpatient Hospital Stay: Payer: Medicare HMO | Admitting: Oncology

## 2023-02-09 VITALS — BP 115/65 | HR 79 | Temp 98.1°F | Resp 18 | Ht 62.0 in | Wt 167.0 lb

## 2023-02-09 VITALS — BP 114/72 | HR 61

## 2023-02-09 DIAGNOSIS — C210 Malignant neoplasm of anus, unspecified: Secondary | ICD-10-CM | POA: Diagnosis not present

## 2023-02-09 DIAGNOSIS — Z79620 Long term (current) use of immunosuppressive biologic: Secondary | ICD-10-CM | POA: Diagnosis not present

## 2023-02-09 DIAGNOSIS — C770 Secondary and unspecified malignant neoplasm of lymph nodes of head, face and neck: Secondary | ICD-10-CM | POA: Diagnosis not present

## 2023-02-09 DIAGNOSIS — Z5112 Encounter for antineoplastic immunotherapy: Secondary | ICD-10-CM | POA: Diagnosis not present

## 2023-02-09 DIAGNOSIS — R300 Dysuria: Secondary | ICD-10-CM | POA: Diagnosis not present

## 2023-02-09 LAB — CMP (CANCER CENTER ONLY)
ALT: 31 U/L (ref 0–44)
AST: 48 U/L — ABNORMAL HIGH (ref 15–41)
Albumin: 4.2 g/dL (ref 3.5–5.0)
Alkaline Phosphatase: 97 U/L (ref 38–126)
Anion gap: 7 (ref 5–15)
BUN: 13 mg/dL (ref 8–23)
CO2: 26 mmol/L (ref 22–32)
Calcium: 9.8 mg/dL (ref 8.9–10.3)
Chloride: 103 mmol/L (ref 98–111)
Creatinine: 0.93 mg/dL (ref 0.44–1.00)
GFR, Estimated: >60 mL/min (ref >60)
Glucose, Bld: 102 mg/dL — ABNORMAL HIGH (ref 70–99)
Potassium: 4.3 mmol/L (ref 3.5–5.1)
Sodium: 136 mmol/L (ref 135–145)
Total Bilirubin: 0.4 mg/dL (ref 0.3–1.2)
Total Protein: 7.6 g/dL (ref 6.5–8.1)

## 2023-02-09 LAB — TSH: TSH: 3.81 u[IU]/mL (ref 0.350–4.500)

## 2023-02-09 LAB — CBC WITH DIFFERENTIAL (CANCER CENTER ONLY)
Abs Immature Granulocytes: 0.02 10*3/uL (ref 0.00–0.07)
Basophils Absolute: 0.1 10*3/uL (ref 0.0–0.1)
Basophils Relative: 1 %
Eosinophils Absolute: 0.2 10*3/uL (ref 0.0–0.5)
Eosinophils Relative: 4 %
HCT: 38.2 % (ref 36.0–46.0)
Hemoglobin: 12.2 g/dL (ref 12.0–15.0)
Immature Granulocytes: 0 %
Lymphocytes Relative: 13 %
Lymphs Abs: 0.7 10*3/uL (ref 0.7–4.0)
MCH: 29.1 pg (ref 26.0–34.0)
MCHC: 31.9 g/dL (ref 30.0–36.0)
MCV: 91.2 fL (ref 80.0–100.0)
Monocytes Absolute: 0.3 10*3/uL (ref 0.1–1.0)
Monocytes Relative: 6 %
Neutro Abs: 4 10*3/uL (ref 1.7–7.7)
Neutrophils Relative %: 76 %
Platelet Count: 247 10*3/uL (ref 150–400)
RBC: 4.19 MIL/uL (ref 3.87–5.11)
RDW: 14.6 % (ref 11.5–15.5)
WBC Count: 5.3 10*3/uL (ref 4.0–10.5)
nRBC: 0 % (ref 0.0–0.2)

## 2023-02-09 MED ORDER — SODIUM CHLORIDE 0.9 % IV SOLN
480.0000 mg | Freq: Once | INTRAVENOUS | Status: AC
  Administered 2023-02-09: 480 mg via INTRAVENOUS
  Filled 2023-02-09: qty 48

## 2023-02-09 MED ORDER — SODIUM CHLORIDE 0.9 % IV SOLN: Freq: Once | INTRAVENOUS | Status: AC

## 2023-02-09 NOTE — Progress Notes (Signed)
Patient seen by Dr. Sherrill today ? ?Vitals are within treatment parameters. ? ?Labs reviewed by Dr. Sherrill and are within treatment parameters. ? ?Per physician team, patient is ready for treatment and there are NO modifications to the treatment plan.  ?

## 2023-02-09 NOTE — Progress Notes (Signed)
Ashley Moore OFFICE PROGRESS NOTE   Diagnosis: Anal cancer  INTERVAL HISTORY:   Ashley Moore was last treated with nivolumab on 01/12/2023.  She reports resolution of dysuria after a course of Bactrim.  A urine culture returned negative on 01/25/2023.  Induration at the left lower leg has improved with a heating pad.  No new complaint.  Objective:  Vital signs in last 24 hours:  Blood pressure 115/65, pulse 79, temperature 98.1 F (36.7 C), temperature source Oral, resp. rate 18, height 5\' 2"  (1.575 m), weight 167 lb (75.8 kg), SpO2 99 %.   Lymphatics: No cervical, supraclavicular, axillary, or inguinal nodes Resp: Lungs clear bilaterally Cardio: Regular rate and rhythm GI: No hepatosplenomegaly Vascular: Trace edema at the lower leg bilaterally, minimal induration at the medial left lower leg     Lab Results:  Lab Results  Component Value Date   WBC 5.3 02/09/2023   HGB 12.2 02/09/2023   HCT 38.2 02/09/2023   MCV 91.2 02/09/2023   PLT 247 02/09/2023   NEUTROABS 4.0 02/09/2023    CMP  Lab Results  Component Value Date   NA 136 02/09/2023   K 4.3 02/09/2023   CL 103 02/09/2023   CO2 26 02/09/2023   GLUCOSE 102 (H) 02/09/2023   BUN 13 02/09/2023   CREATININE 0.93 02/09/2023   CALCIUM 9.8 02/09/2023   PROT 7.6 02/09/2023   ALBUMIN 4.2 02/09/2023   AST 48 (H) 02/09/2023   ALT 31 02/09/2023   ALKPHOS 97 02/09/2023   BILITOT 0.4 02/09/2023   GFRNONAA >60 02/09/2023   GFRAA >60 06/17/2020    Lab Results  Component Value Date   CEA1 1.15 03/04/2021   CEA 1.16 03/04/2021    Lab Results  Component Value Date   INR 1.2 05/16/2021   LABPROT 15.2 05/16/2021    Imaging:  CT CHEST ABDOMEN PELVIS W CONTRAST  Result Date: 02/07/2023 CLINICAL DATA:  Anal cancer restaging, ongoing chemotherapy * Tracking Code: BO * EXAM: CT CHEST, ABDOMEN, AND PELVIS WITH CONTRAST TECHNIQUE: Multidetector CT imaging of the chest, abdomen and pelvis was performed  following the standard protocol during bolus administration of intravenous contrast. RADIATION DOSE REDUCTION: This exam was performed according to the departmental dose-optimization program which includes automated exposure control, adjustment of the mA and/or kV according to patient size and/or use of iterative reconstruction technique. CONTRAST:  75mL OMNIPAQUE IOHEXOL 300 MG/ML SOLN additional oral enteric contrast COMPARISON:  08/16/2022 FINDINGS: CT CHEST FINDINGS Cardiovascular: Aortic atherosclerosis. Normal heart size. Left coronary artery calcifications. No pericardial effusion. Mediastinum/Nodes: No enlarged mediastinal, hilar, or axillary lymph nodes. Thyroid gland, trachea, and esophagus demonstrate no significant findings. Lungs/Pleura: Unchanged mild bibasilar fibrotic septal thickening and traction bronchiectasis (series, image 107). No pleural effusion or pneumothorax. Musculoskeletal: No chest wall abnormality. No acute osseous findings. CT ABDOMEN PELVIS FINDINGS Hepatobiliary: No solid liver abnormality is seen. Unchanged mild intra and extrahepatic biliary ductal dilatation (series 2, image 59). Common bile duct measures up to 0.9 cm without visible calculi or other obstructing etiology identified to the ampulla. Pancreas: Unremarkable. No pancreatic ductal dilatation or surrounding inflammatory changes. Spleen: Normal in size without significant abnormality. Adrenals/Urinary Tract: Adrenal glands are unremarkable. Severe right hydronephrosis with severe right renal cortical atrophy and obstruction of the proximal right ureter by soft tissue mass, unchanged (series 2, image 63) left kidney is normal, without renal calculi, solid lesion, or hydronephrosis. Unchanged diffusely urinary bladder wall thickening (series 6, image 70). Stomach/Bowel: Stomach is within normal limits.  Appendix appears normal. Sigmoid diverticulosis. Unchanged circumferential wall thickening of the low rectum and anus  (series 2, image 78). Vascular/Lymphatic: Aortic atherosclerosis. No enlarged abdominal or pelvic lymph nodes. Reproductive: No mass or other abnormality. Other: No abdominal wall hernia or abnormality. No ascites. Musculoskeletal: No acute osseous findings. Slightly diminished ill-defined hypodense lesion within the medial aspect of the right psoas musculature measuring 3.3 x 2.0 cm (series 2, image 70) IMPRESSION: 1. Slightly diminished ill-defined hypodense lesion within the medial aspect of the right psoas musculature measuring 3.3 x 2.0 cm consistent with a treated soft tissue metastasis. 2. Unchanged circumferential wall thickening of the low rectum and anus. 3. Severe right hydronephrosis with severe right renal cortical atrophy and obstruction of the proximal right ureter by above-described soft tissue mass, unchanged. 4. No other evidence of lymphadenopathy or metastatic disease in the chest, abdomen, or pelvis. 5. Unchanged mild bibasilar fibrotic septal thickening and traction bronchiectasis, consistent with mild pulmonary fibrosis. This could be further characterized by pulmonary referral and dedicated interstitial lung disease examination of the chest if clinically appropriate in the setting of known malignancy. 6. Unchanged mild intra and extrahepatic biliary ductal dilatation. Common bile duct measures up to 0.9 cm without visible calculi or other obstructing etiology identified to the ampulla. As previously reported, this could be further evaluated by MRI if there is clinical concern for biliary obstruction. 7. Unchanged diffuse urinary bladder wall thickening, suggestive of nonspecific infectious or inflammatory cystitis, possibly related to pelvic radiation therapy. Correlate with urinalysis. 8. Coronary artery disease. Aortic Atherosclerosis (ICD10-I70.0). Electronically Signed   By: Jearld Lesch M.D.   On: 02/07/2023 15:48    Medications: I have reviewed the patient's current  medications.   Assessment/Plan: Anal cancer CT abdomen/pelvis 10/21/2016-thickening of the anus extending to the junction between the anus and rectum with a large stool ball in the rectum and mild fat stranding posterior to the rectum. Enlarged lymph nodes in the right and left inguinal regions. A few mildly prominent nodes seen posterior to the rectum. 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 PET scan 11/10/2016-markedly hypermetabolic anal mass with hypermetabolic metastases to the groin region bilaterally, left pelvic sidewall and presacral space. Initiation of radiation and cycle 1 5-FU/mitomycin C 11/14/2016 Cycle 2 5-FU/mitomycin C 12/12/2016 (5-FU dose reduced due to mucositis, diarrhea, skin breakdown) Radiation completed 12/23/2016 CT abdomen/pelvis 04/14/2017-resolution of anal mass and bilateral inguinal lymphadenopathy. No residual tumor seen. CT abdomen/pelvis 01/28/2020-right hydroureteronephrosis, transition at the level of the mid right ureter, retroperitoneal lymphadenopathy CT renal stone study 01/28/2020-severe right hydronephrosis and proximal right hydroureter, 8 mm area of increased attenuation in the proximal right ureter-stone versus soft tissue mass PET 02/13/2020-hypermetabolic right retroperitoneal nodal metastases in the aortocaval and posterior pericaval chains, nonspecific mild anal wall hypermetabolism, right nephroureteral stent 02/21/2020 anorectal exam per Dr. Theadora Rama radiation changes of the skin around the anal margin.  Posterior midline smooth scarring.  Mildly stenotic.  Stenosis seems better.  No palpable concerning lesions.  Entire anal canal and distal rectum feels smooth and healthy.  Partial anoscopy performed with no lesions in the anal canal. Xeloda/radiation beginning 03/02/2020, completed 04/13/2020 CT abdomen/pelvis 06/17/2020-resolution of right retroperitoneal lymphadenopathy, no remaining pathologically enlarged lymph nodes,  residual mild right hydronephrosis, no evidence of progressive disease, stable anal wall thickening Digital rectal exam by Dr. Roxan Hockey 08/26/2020-posterior midline smooth scarring.  Mildly stenotic.  Stenosis seems better.  No palpable concerning lesions.  Anal canal feels smooth without palpable concern.  Unable to tolerate anoscopy.  Next visit in 6 months for surveillance. CT abdomen/pelvis 10/28/2020-no evidence of local recurrence or metastatic disease.  Stable mild rectal wall thickening.  New diffuse bladder wall thickening possibly related to prior radiation CT abdomen/pelvis 01/16/2021- recurrent right-sided hydronephrosis and proximal right hydroureter status post removal of right ureter stent, new soft tissue nodule along the course of the right ureter between the right psoas and IVC suspicious for recurrent lymphadenopathy PET 02/12/2021-small focus of hypermetabolism at the anorectal junction without a definable mass, right retroperitoneal nodal metastasis with obstruction of the proximal right ureter, right middle lobe hypermetabolic nodule, hypermetabolism in the right hilum without a defined nodal mass, hypermetabolic left supraclavicular and left axillary nodes 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 Cycle 1 Taxol/carboplatin 03/11/2021 Cycle 2 Taxol/carboplatin 03/31/2021, Fulphila Cycle 3 Taxol/carboplatin 04/21/2021, Fulphila CTs 05/11/2021-decrease size of FDG avid nodule in right middle lobe, right middle lobe subpleural nodule slightly enlarged, decreased in size of left axillary and subpectoral nodes, unchanged FDG avid right iliac nodes Cycle 4 Taxol/carboplatin 05/12/2021, G-CSF discontinued secondary to poor tolerance, Taxol and carboplatin dose reduced Cycle 5 Taxol/carboplatin 06/02/2021 Cycle 6 carboplatin 07/01/2021, Taxol held due to neuropathy Cycle 7 carboplatin 07/29/2021, Taxol held due to neuropathy CTs 08/16/2021-new  liver lesion, increase in right psoas lymph node, unchanged right hydronephrosis, new mediastinal and right hilar lymphadenopathy, enlargement of bilateral pulmonary nodules and at least one new nodule, new subcarinal node Cycle 1 nivolumab 08/25/2021 Cycle 2 nivolumab 09/08/2021 Cycle 3 nivolumab 09/22/2021 Cycle 4 nivolumab 10/06/2021 Cycle 5 nivolumab 10/20/2021 Cycle 6 nivolumab 11/04/2021 CTs 11/12/2021-resolution of lung nodules, decreased size of right psoas node, no remaining mediastinal lymphadenopathy, resolution of right liver lesion Cycle 7 nivolumab 11/17/2021 Cycle 8 nivolumab 12/01/2021 Cycle 9 nivolumab 12/15/2021 Cycle 10 nivolumab 12/30/2021 Cycle 11 nivolumab 01/12/2022 Cycle 12 nivolumab 5/10/261-monthly dosing Cycle 13 nivolumab 02/23/2022 CT 03/16/2022-stable right psoas mass, no pulmonary nodules, persistent circumferential anal thickening, unchanged subcentimeter left liver lesion Cycle 14 nivolumab 03/24/2022 Cycle 15 nivolumab 04/21/2022 Cycle 16 nivolumab 05/19/2022 Cycle 17 nivolumab 06/16/2022 Cycle 18 nivolumab 07/14/2022 CTs 08/16/2022-stable ill-defined soft tissue in the right retroperitoneum, very mild intra and EXTR hepatic biliary duct dilation, common bile duct dilated to 11 mm in the porta hepatis Cycle 19 nivolumab 08/18/2022 Cycle 20 nivolumab 09/15/2022 Cycle 21 nivolumab 10/18/2022 Cycle 22 nivolumab 11/15/2022 Cycle 23 nivolumab 12/14/2022 Cycle 24 nivolumab 01/12/2023 CTs 02/07/2023-slight decrease in size of the right psoas mass, no other evidence of metastatic disease Cycle 25 nivolumab 02/09/2023 Left labial lesions. Question direct extension from the anal cancer versus metastatic disease from anal cancer versus a separate malignant process. 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 Multiple family members with breast cancer. History of hypokalemia-likely  secondary to decreased nutritional intake and diarrhea; potassium in normal range 01/16/2017. No longer taking a potassium supplement. Right hydroureteronephrosis on CT 01/28/2020, status post a cystoscopy/pyelogram 01/28/2020 confirming extrinsic compression of the right ureter, status post stent placement Right ureter stent exchange 06/12/2020 Right ureter stent removed 01/13/2021   8.  Admission 01/07/2021 with Klebsiella pneumoniae urosepsis/bacteremia  9.  Hospital admission 05/15/2021 with UTI/possible right pyelonephritis      Disposition: Ashley Moore has been maintained on nivolumab since December 2022.  She is in clinical remission from anal cancer.  The restaging CTs reveal no evidence of progressive disease.  The plan is to continue nivolumab for total of 2 years.  She will complete another cycle today.  Ashley Moore will return for an office visit in nivolumab in 4 weeks.  Thornton Papas, MD  02/09/2023  11:16 AM

## 2023-02-09 NOTE — Patient Instructions (Signed)
[place] [facility]   Discharge Instructions: Thank you for choosing [facility] to provide your oncology and hematology care.   If you have a lab appointment with the Cancer Center, please go directly to the Cancer Center and check in at the registration area.   Wear comfortable clothing and clothing appropriate for easy access to any Portacath or PICC line.   We strive to give you quality time with your provider. You may need to reschedule your appointment if you arrive late (15 or more minutes).  Arriving late affects you and other patients whose appointments are after yours.  Also, if you miss three or more appointments without notifying the office, you may be dismissed from the clinic at the provider's discretion.      For prescription refill requests, have your pharmacy contact our office and allow 72 hours for refills to be completed.    Today you received the following chemotherapy and/or immunotherapy agents Nivolumab      To help prevent nausea and vomiting after your treatment, we encourage you to take your nausea medication as directed.  BELOW ARE SYMPTOMS THAT SHOULD BE REPORTED IMMEDIATELY: *FEVER GREATER THAN 100.4 F (38 C) OR HIGHER *CHILLS OR SWEATING *NAUSEA AND VOMITING THAT IS NOT CONTROLLED WITH YOUR NAUSEA MEDICATION *UNUSUAL SHORTNESS OF BREATH *UNUSUAL BRUISING OR BLEEDING *URINARY PROBLEMS (pain or burning when urinating, or frequent urination) *BOWEL PROBLEMS (unusual diarrhea, constipation, pain near the anus) TENDERNESS IN MOUTH AND THROAT WITH OR WITHOUT PRESENCE OF ULCERS (sore throat, sores in mouth, or a toothache) UNUSUAL RASH, SWELLING OR PAIN  UNUSUAL VAGINAL DISCHARGE OR ITCHING   Items with * indicate a potential emergency and should be followed up as soon as possible or go to the Emergency Department if any problems should occur.  Please show the CHEMOTHERAPY ALERT CARD or IMMUNOTHERAPY ALERT CARD at  check-in to the Emergency Department and triage nurse.  Should you have questions after your visit or need to cancel or reschedule your appointment, please contact [facility]  Dept: [phone]  and follow the prompts.  Office hours are 8:00 a.m. to 4:30 p.m. Monday - Friday. Please note that voicemails left after 4:00 p.m. may not be returned until the following business day.  We are closed weekends and major holidays. You have access to a nurse at all times for urgent questions. Please call the main number to the clinic Dept: [phone] and follow the prompts.   For any non-urgent questions, you may also contact your provider using MyChart. We now offer e-Visits for anyone 34 and older to request care online for non-urgent symptoms. For details visit [email or web address].   Also download the MyChart app! Go to the app store, search "MyChart", open the app, select [place], and log in with your MyChart username and password.  Nivolumab Injection What is this medication? NIVOLUMAB (nye VOL ue mab) treats some types of cancer. It works by helping your immune system slow or stop the spread of cancer cells. It is a monoclonal antibody. This medicine may be used for other purposes; ask your health care provider or pharmacist if you have questions. COMMON BRAND NAME(S): Opdivo What should I tell my care team before I take this medication? They need to know if you have any of these conditions: Allogeneic stem cell transplant (uses someone else's stem cells) Autoimmune diseases, such as Crohn disease, ulcerative colitis, lupus History of chest radiation Nervous system problems,  such as Guillain-Barre syndrome or myasthenia gravis Organ transplant An unusual or allergic reaction to nivolumab, other medications, foods, dyes, or preservatives Pregnant or trying to get pregnant Breast-feeding How should I use this medication? This medication is infused into  a vein. It is given in a hospital or clinic setting. A special MedGuide will be given to you before each treatment. Be sure to read this information carefully each time. Talk to your care team about the use of this medication in children. While it may be prescribed for children as young as 12 years for selected conditions, precautions do apply. Overdosage: If you think you have taken too much of this medicine contact a poison control center or emergency room at once. NOTE: This medicine is only for you. Do not share this medicine with others. What if I miss a dose? Keep appointments for follow-up doses. It is important not to miss your dose. Call your care team if you are unable to keep an appointment. What may interact with this medication? Interactions have not been studied. This list may not describe all possible interactions. Give your health care provider a list of all the medicines, herbs, non-prescription drugs, or dietary supplements you use. Also tell them if you smoke, drink alcohol, or use illegal drugs. Some items may interact with your medicine. What should I watch for while using this medication? Your condition will be monitored carefully while you are receiving this medication. You may need blood work while taking this medication. This medication may cause serious skin reactions. They can happen weeks to months after starting the medication. Contact your care team right away if you notice fevers or flu-like symptoms with a rash. The rash may be red or purple and then turn into blisters or peeling of the skin. You may also notice a red rash with swelling of the face, lips, or lymph nodes in your neck or under your arms. Tell your care team right away if you have any change in your eyesight. Talk to your care team if you are pregnant or think you might be pregnant. A negative pregnancy test is required before starting this medication. A reliable form of contraception is recommended while  taking this medication and for 5 months after the last dose. Talk to your care team about effective forms of contraception. Do not breast-feed while taking this medication and for 5 months after the last dose. What side effects may I notice from receiving this medication? Side effects that you should report to your care team as soon as possible: Allergic reactions--skin rash, itching, hives, swelling of the face, lips, tongue, or throat Dry cough, shortness of breath or trouble breathing Eye pain, redness, irritation, or discharge with blurry or decreased vision Heart muscle inflammation--unusual weakness or fatigue, shortness of breath, chest pain, fast or irregular heartbeat, dizziness, swelling of the ankles, feet, or hands Hormone gland problems--headache, sensitivity to light, unusual weakness or fatigue, dizziness, fast or irregular heartbeat, increased sensitivity to cold or heat, excessive sweating, constipation, hair loss, increased thirst or amount of urine, tremors or shaking, irritability Infusion reactions--chest pain, shortness of breath or trouble breathing, feeling faint or lightheaded Kidney injury (glomerulonephritis)--decrease in the amount of urine, red or dark brown urine, foamy or bubbly urine, swelling of the ankles, hands, or feet Liver injury--right upper belly pain, loss of appetite, nausea, light-colored stool, dark yellow or brown urine, yellowing skin or eyes, unusual weakness or fatigue Pain, tingling, or numbness in the hands or feet, muscle  weakness, change in vision, confusion or trouble speaking, loss of balance or coordination, trouble walking, seizures Rash, fever, and swollen lymph nodes Redness, blistering, peeling, or loosening of the skin, including inside the mouth Sudden or severe stomach pain, bloody diarrhea, fever, nausea, vomiting Side effects that usually do not require medical attention (report these to your care team if they continue or are  bothersome): Bone, joint, or muscle pain Diarrhea Fatigue Loss of appetite Nausea Skin rash This list may not describe all possible side effects. Call your doctor for medical advice about side effects. You may report side effects to FDA at 1-800-FDA-1088. Where should I keep my medication? This medication is given in a hospital or clinic. It will not be stored at home. NOTE: This sheet is a summary. It may not cover all possible information. If you have questions about this medicine, talk to your doctor, pharmacist, or health care provider.  2024 Elsevier/Gold Standard (2022-01-03 00:00:00)

## 2023-02-10 ENCOUNTER — Other Ambulatory Visit: Payer: Self-pay

## 2023-02-10 LAB — T4: T4, Total: 9.2 ug/dL (ref 4.5–12.0)

## 2023-02-14 ENCOUNTER — Telehealth: Payer: Self-pay | Admitting: Internal Medicine

## 2023-02-14 NOTE — Telephone Encounter (Signed)
Attempted to reach pt to inform her she has a refill left. Left a voicemail to call us back.

## 2023-02-14 NOTE — Telephone Encounter (Signed)
Pt did pick up levothyroxine (SYNTHROID) 75 MCG tablet and has missed place her medication and would like refill on  [id number] [place], [place] - [address] AT [address] Phone: [phone]  Fax: [phone]

## 2023-02-14 NOTE — Telephone Encounter (Signed)
Pt just got medication on 02-01-2023 its not time for her refill its will be to soon . Pt need more medication she missed  place the medication need emergency  [id number] [place], [place] - [address] AT [address] Phone: [phone]  Fax: [phone]

## 2023-02-15 NOTE — Telephone Encounter (Signed)
Spoke to pt. Inform pt, she can pick up her refill but with out of pocket since she is early for pick up. Verbalized understanding.   F/u with pharmacy. Reports to them what's going on. They said will run it thru discount card. Will notify pt when Rx is ready.

## 2023-02-22 ENCOUNTER — Encounter: Payer: Self-pay | Admitting: Internal Medicine

## 2023-02-22 ENCOUNTER — Telehealth: Payer: Medicare HMO | Admitting: Internal Medicine

## 2023-02-22 DIAGNOSIS — Z79899 Other long term (current) drug therapy: Secondary | ICD-10-CM | POA: Diagnosis not present

## 2023-02-22 DIAGNOSIS — M25511 Pain in right shoulder: Secondary | ICD-10-CM

## 2023-02-22 DIAGNOSIS — E039 Hypothyroidism, unspecified: Secondary | ICD-10-CM

## 2023-02-22 DIAGNOSIS — C210 Malignant neoplasm of anus, unspecified: Secondary | ICD-10-CM | POA: Diagnosis not present

## 2023-02-22 MED ORDER — LEVOTHYROXINE SODIUM 75 MCG PO TABS: 75.0000 ug | ORAL_TABLET | Freq: Every day | ORAL | 3 refills | Status: DC

## 2023-02-22 NOTE — Progress Notes (Signed)
Virtual Visit via Video Note  I connected with Ashley Moore on 02/22/23 at 12:30 PM EDT by a video enabled telemedicine application and verified that I am speaking with the correct person using two identifiers. Location patient: vehicle Location provider:work office Persons participating in the virtual visit: patient, provider  Patient aware  of the limitations of evaluation and management by telemedicine and  availability of in person appointments. and agreed to proceed.   HPI: Ashley Moore presents for video visit   Thyroid  replacement  due  tsh done per oncology 3.28  taking med reg no concerns   Sees Dr Truett Perna every month on immunotherapy and seems stable  for metastatic anal cancer  Knee arthritis   injections  helpful  Doing stretching and gentle activity exercising   ROS: See pertinent positives and negatives per HPI. No urinary  infec issues currently on prn follow urology.  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  Past Medical History:  Diagnosis Date   Arthritis    Bowen's disease    excised 1992   Chronic low back pain    Chronic radiation cystitis    DDD (degenerative disc disease), cervical    Family history of breast cancer    Family history of lung cancer    Family history of non-Hodgkin's lymphoma    Family history of ovarian cancer    Headache    Hx of varicella    Hydronephrosis of right kidney    urologist--- dr Berneice Heinrich,  malignant,  treated with ureter stent   Hypothyroidism    followed by pcp   Lower urinary tract symptoms (LUTS)    01-12-2021  per pt treated with accupuncture at dr Berneice Heinrich office   Lymphedema of both lower extremities    PICC (peripherally inserted central catheter) in place 01/11/2021   placed at [facility] in [place], [place] for IV antibiotics   Positive blood culture 01/08/2021   Klebsiella Pneumaniae,  treated with daily IV antibiotic   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[facility]) oncologist--- dr Truett Perna   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,  invasive SCC , completed chemo/ radiation 12-23-2016;  recurrent metastatsis retroperitoneal lymphdenopathy,  completed radiation 04-13-2020 residual right ureteral uropathy obstruction   Retroperitoneal lymphadenopathy    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   Sepsis due to Klebsiella pneumoniae (HCC) 01/07/2021   pt admitted to [facility] in [place],  dx sepsis secondary to acute pyelonephritis with bacteremia , positive blood culture,  discharged 01-11-2021 home daily IV antibiotic   Wears glasses     Past Surgical History:  Procedure Laterality Date   BUNIONECTOMY Right yrs ago   CYSTOSCOPY W/ URETERAL STENT PLACEMENT Right 06/12/2020   Procedure: CYSTOSCOPY WITH RETROGRADE PYELOGRAM/URETERAL STENT EXCHANGE;  Surgeon: Sebastian Ache, MD;  Location: [facility];  Service: Urology;  Laterality: Right;   CYSTOSCOPY W/ URETERAL STENT PLACEMENT Right 01/13/2021   Procedure: CYSTOSCOPY WITH RETROGRADE PYELOGRAM/URETERAL STENT REMOVALRIGHT;  Surgeon: Sebastian Ache, MD;  Location: [facility];  Service: Urology;  Laterality: Right;  45 MINS   CYSTOSCOPY WITH RETROGRADE PYELOGRAM, URETEROSCOPY AND STENT PLACEMENT Right 01/28/2020   Procedure: CYSTOSCOPY WITH RETROGRADE PYELOGRAM, URETEROSCOPY AND STENT PLACEMENT;  Surgeon: Sebastian Ache, MD;  Location: [facility];  Service: Urology;  Laterality: Right;   IR GENERIC HISTORICAL  11/14/2016   IR US GUIDE VASC ACCESS RIGHT 11/14/2016 [facility]   IR GENERIC HISTORICAL  11/14/2016  IR FLUORO GUIDE CV LINE RIGHT 11/14/2016 [facility]   IR GENERIC HISTORICAL  12/12/2016   IR US GUIDE VASC ACCESS RIGHT 12/12/2016 Simonne Come, MD [facility]   IR GENERIC HISTORICAL  12/12/2016   IR FLUORO GUIDE CV LINE RIGHT 12/12/2016 Simonne Come, MD [facility]   SKIN CANCER EXCISION  1990   bowens disease    Family History  Problem Relation Age of Onset   Lung cancer Mother         lung   Breast cancer Mother 57   Ovarian cancer Mother 29   Pancreatitis Father    Breast cancer Sister 2   Breast cancer Maternal Grandmother 78       breast    Breast cancer Maternal Aunt 66       breast   Melanoma Sister    Breast cancer Sister 13   Non-Hodgkin's lymphoma Paternal Grandmother     Social History   Tobacco Use   Smoking status: Former    Packs/day: 1.00    Years: 25.00    Additional pack years: 0.00    Total pack years: 25.00    Types: Cigarettes    Quit date: 06/20/1995    Years since quitting: 27.6   Smokeless tobacco: Never  Vaping Use   Vaping Use: Never used  Substance Use Topics   Alcohol use: Not Currently    Alcohol/week: 0.0 standard drinks of alcohol    Comment: occasional wine   Drug use: Yes    Types: Marijuana    Comment: 01-12-2021  per pt once a week      Current Outpatient Medications:    acetaminophen (TYLENOL) 650 MG CR tablet, Take 650 mg by mouth 2 (two) times daily., Disp: , Rfl:    Ascorbic Acid (VITAMIN C) 500 MG CAPS, Take 500 mg by mouth daily., Disp: , Rfl:    azelastine (ASTELIN) 0.1 % nasal spray, Place 2 sprays into both nostrils 2 (two) times daily. Use in each nostril as directed, Disp: 30 mL, Rfl: 1   Calcium Carb-Cholecalciferol 600-10 MG-MCG CAPS, Take by mouth., Disp: , Rfl:    conjugated estrogens (PREMARIN) vaginal cream, Place 1 Applicatorful vaginally daily., Disp: , Rfl:    Cranberry 500 MG TABS, Take 1 tablet by mouth at bedtime., Disp: , Rfl:    LORazepam (ATIVAN) 0.5 MG tablet, Take 1 tablet (0.5 mg total) by mouth every 8 (eight) hours as needed for anxiety (as needed)., Disp: 30 tablet, Rfl: 0   MYRBETRIQ 50 MG TB24 tablet, Take 50 mg by mouth daily., Disp: , Rfl:    OVER THE COUNTER MEDICATION, Place 1 drop into both eyes daily as needed (dry eyes)., Disp: , Rfl:    OVER THE COUNTER MEDICATION, Take 1 Scoop by mouth daily. Essential Orange, Disp: , Rfl:    Polyethyl Glycol-Propyl Glycol (SYSTANE FREE  OP), Apply 1 drop to eye as needed., Disp: , Rfl:    Prenatal Vit-Fe Fumarate-FA (PRENATAL PO), Take by mouth daily., Disp: , Rfl:    protein supplement shake (PREMIER PROTEIN) LIQD, Take 11 oz by mouth daily., Disp: , Rfl:    TURMERIC PO, Take 750 mg by mouth daily., Disp: , Rfl:    levothyroxine (SYNTHROID) 75 MCG tablet, Take 1 tablet (75 mcg total) by mouth daily., Disp: 90 tablet, Rfl: 3  EXAM: BP Readings from Last 3 Encounters:  02/09/23 114/72  02/09/23 115/65  01/12/23 130/80    VITALS per patient if applicable:  GENERAL: alert,  oriented, appears well and in no acute distress  HEENT: atraumatic, conjunttiva clear, no obvious abnormalities on inspection of external nose and ears  NECK: normal movements of the head and neck  LUNGS: on inspection no signs of respiratory distress, breathing rate appears normal, no obvious gross SOB, gasping or wheezing  CV: no obvious cyanosis PSYCH/NEURO: pleasant and cooperative, no obvious depression or anxiety, speech and thought processing grossly intact Lab Results  Component Value Date   WBC 5.3 02/09/2023   HGB 12.2 02/09/2023   HCT 38.2 02/09/2023   PLT 247 02/09/2023   GLUCOSE 102 (H) 02/09/2023   CHOL 156 03/29/2022   TRIG 56 03/29/2022   HDL 94 03/29/2022   LDLCALC 50 03/29/2022   ALT 31 02/09/2023   AST 48 (H) 02/09/2023   NA 136 02/09/2023   K 4.3 02/09/2023   CL 103 02/09/2023   CREATININE 0.93 02/09/2023   BUN 13 02/09/2023   CO2 26 02/09/2023   TSH 3.810 02/09/2023   INR 1.2 05/16/2021   HGBA1C 5.5 05/11/2015   Record review  ASSESSMENT AND PLAN:  Discussed the following assessment and plan:    ICD-10-CM   1. Hypothyroidism, unspecified type  E03.9    tesh in acceptable range  refill  repalement today 75 mcg/d    2. Medication management  Z79.899     3. Anal cancer (HCC)  C21.0    under immunotherapy    4. Right shoulder pain, unspecified chronicity  M25.511    managing for now  while on  immunotherapy     Glad she is doing well  Counseled.   Expectant management and discussion of plan and treatment with opportunity to ask questions and all were answered. The patient agreed with the plan and demonstrated an understanding of the instructions.   Advised to call back or seek an in-person evaluation if worsening  or having  further concerns  in interim. Return for as indicated and yearly .    Berniece Andreas, MD

## 2023-03-02 ENCOUNTER — Ambulatory Visit: Payer: Self-pay

## 2023-03-02 NOTE — Patient Instructions (Signed)
Visit Information  Thank you for taking time to visit with me today. Please don't hesitate to contact me if I can be of assistance to you.   Following are the goals we discussed today:   Goals Addressed             This Visit's Progress    " Getting Stronger"       Patient Goals/Self Care Activities: -Patient/Caregiver will self-administer medications as prescribed as evidenced by self-report/primary caregiver report  -Patient/Caregiver will attend all scheduled provider appointments as evidenced by clinician review of documented attendance to scheduled appointments and patient/caregiver report -Patient/Caregiver will call provider office for new concerns or questions as evidenced by review of documented incoming telephone call notes and patient report     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 No concerns.              Our next appointment is by telephone on 05/11/23 at 1200 pm  Please call the care guide team at [phone] if you need to cancel or reschedule your appointment.   If you are experiencing a Mental Health or Behavioral Health Crisis or need someone to talk to, please call the Suicide and Crisis Lifeline: 988   Patient verbalizes understanding of instructions and care plan provided today and agrees to view in MyChart. Active MyChart status and patient understanding of how to access instructions and care plan via MyChart confirmed with patient.     The patient has been provided with contact information for the care management team and has been advised to call with any health related questions or concerns.   Bary Leriche, RN, MSN [facility] Care Management Care Management Coordinator Direct Line [phone]

## 2023-03-02 NOTE — Patient Outreach (Addendum)
  Care Coordination   Follow Up Visit Note   03/02/2023 Name: Ashley Moore MRN: [id number] DOB: 09-16-1951  Ashley Moore is a 72 y.o. year old female who sees Panosh, Neta Mends, MD for primary care. I spoke with  [name] by phone today.  What matters to the patients health and wellness today?  Continue to get stronger    Goals Addressed             This Visit's Progress    " Getting Stronger"       Patient Goals/Self Care Activities: -Patient/Caregiver will self-administer medications as prescribed as evidenced by self-report/primary caregiver report  -Patient/Caregiver will attend all scheduled provider appointments as evidenced by clinician review of documented attendance to scheduled appointments and patient/caregiver report -Patient/Caregiver will call provider office for new concerns or questions as evidenced by review of documented incoming telephone call notes and patient report     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 Doing yoga and enjoying it.  No concerns.              SDOH assessments and interventions completed:  Yes  SDOH Interventions Today    Flowsheet Row Most Recent Value  SDOH Interventions   Utilities Interventions Intervention Not Indicated        Care Coordination Interventions:  Yes, provided   Follow up plan: Follow up call scheduled for August    Encounter Outcome:  Pt. Visit Completed   Bary Leriche, RN, MSN [facility] Care Management Care Management Coordinator Direct Line [phone]

## 2023-03-03 ENCOUNTER — Other Ambulatory Visit: Payer: Self-pay

## 2023-03-09 ENCOUNTER — Inpatient Hospital Stay (HOSPITAL_BASED_OUTPATIENT_CLINIC_OR_DEPARTMENT_OTHER): Payer: Medicare HMO | Admitting: Oncology

## 2023-03-09 ENCOUNTER — Inpatient Hospital Stay: Payer: Medicare HMO

## 2023-03-09 ENCOUNTER — Inpatient Hospital Stay: Payer: Medicare HMO | Attending: Nurse Practitioner

## 2023-03-09 VITALS — BP 136/69 | HR 64 | Resp 18

## 2023-03-09 VITALS — BP 125/76 | HR 71 | Temp 98.1°F | Resp 18 | Ht 62.0 in | Wt 167.0 lb

## 2023-03-09 DIAGNOSIS — Z79620 Long term (current) use of immunosuppressive biologic: Secondary | ICD-10-CM | POA: Diagnosis not present

## 2023-03-09 DIAGNOSIS — C778 Secondary and unspecified malignant neoplasm of lymph nodes of multiple regions: Secondary | ICD-10-CM | POA: Diagnosis not present

## 2023-03-09 DIAGNOSIS — C210 Malignant neoplasm of anus, unspecified: Secondary | ICD-10-CM

## 2023-03-09 DIAGNOSIS — R350 Frequency of micturition: Secondary | ICD-10-CM | POA: Insufficient documentation

## 2023-03-09 DIAGNOSIS — Z5112 Encounter for antineoplastic immunotherapy: Secondary | ICD-10-CM | POA: Diagnosis not present

## 2023-03-09 LAB — CBC WITH DIFFERENTIAL (CANCER CENTER ONLY)
Abs Immature Granulocytes: 0.06 10*3/uL (ref 0.00–0.07)
Basophils Absolute: 0.1 10*3/uL (ref 0.0–0.1)
Basophils Relative: 1 %
Eosinophils Absolute: 0.3 10*3/uL (ref 0.0–0.5)
Eosinophils Relative: 6 %
HCT: 37 % (ref 36.0–46.0)
Hemoglobin: 12.1 g/dL (ref 12.0–15.0)
Immature Granulocytes: 1 %
Lymphocytes Relative: 18 %
Lymphs Abs: 0.9 10*3/uL (ref 0.7–4.0)
MCH: 28.9 pg (ref 26.0–34.0)
MCHC: 32.7 g/dL (ref 30.0–36.0)
MCV: 88.5 fL (ref 80.0–100.0)
Monocytes Absolute: 0.5 10*3/uL (ref 0.1–1.0)
Monocytes Relative: 10 %
Neutro Abs: 3.2 10*3/uL (ref 1.7–7.7)
Neutrophils Relative %: 64 %
Platelet Count: 237 10*3/uL (ref 150–400)
RBC: 4.18 MIL/uL (ref 3.87–5.11)
RDW: 14.6 % (ref 11.5–15.5)
WBC Count: 4.9 10*3/uL (ref 4.0–10.5)
nRBC: 0 % (ref 0.0–0.2)

## 2023-03-09 LAB — URINALYSIS, COMPLETE (UACMP) WITH MICROSCOPIC
Bilirubin Urine: NEGATIVE
Glucose, UA: NEGATIVE mg/dL
Ketones, ur: NEGATIVE mg/dL
Nitrite: NEGATIVE
Specific Gravity, Urine: 1.008 (ref 1.005–1.030)
WBC, UA: >50 WBC/hpf (ref 0–5)
pH: 6 (ref 5.0–8.0)

## 2023-03-09 LAB — CMP (CANCER CENTER ONLY)
ALT: 23 U/L (ref 0–44)
AST: 36 U/L (ref 15–41)
Albumin: 3.9 g/dL (ref 3.5–5.0)
Alkaline Phosphatase: 81 U/L (ref 38–126)
Anion gap: 9 (ref 5–15)
BUN: 19 mg/dL (ref 8–23)
CO2: 22 mmol/L (ref 22–32)
Calcium: 9.4 mg/dL (ref 8.9–10.3)
Chloride: 103 mmol/L (ref 98–111)
Creatinine: 0.89 mg/dL (ref 0.44–1.00)
GFR, Estimated: >60 mL/min (ref >60)
Glucose, Bld: 81 mg/dL (ref 70–99)
Potassium: 4.8 mmol/L (ref 3.5–5.1)
Sodium: 134 mmol/L — ABNORMAL LOW (ref 135–145)
Total Bilirubin: 0.4 mg/dL (ref 0.3–1.2)
Total Protein: 7 g/dL (ref 6.5–8.1)

## 2023-03-09 MED ORDER — SODIUM CHLORIDE 0.9 % IV SOLN
480.0000 mg | Freq: Once | INTRAVENOUS | Status: AC
  Administered 2023-03-09: 480 mg via INTRAVENOUS
  Filled 2023-03-09: qty 48

## 2023-03-09 MED ORDER — SODIUM CHLORIDE 0.9 % IV SOLN: Freq: Once | INTRAVENOUS | Status: AC

## 2023-03-09 NOTE — Progress Notes (Signed)
[facility] OFFICE PROGRESS NOTE   Diagnosis: Anal cancer  INTERVAL HISTORY:   Ashley Moore returns as scheduled.  She completed another treatment with nivolumab 03/09/2023.  She generally feels well.  She has developed "bumps "at the left upper back.  She has urinary frequency.  Objective:  Vital signs in last 24 hours:  Blood pressure 125/76, pulse 71, temperature 98.1 F (36.7 C), temperature source Oral, resp. rate 18, height 5\' 2"  (1.575 m), weight 167 lb (75.8 kg), SpO2 98 %.     Lymphatics: No cervical, supraclavicular, axillary, or inguinal nodes Resp: End inspiratory rales at the posterior chest bilaterally, no respiratory distress Cardio: Regular rate and rhythm GI: No hepatosplenomegaly Vascular: Trace edema of the lower leg bilaterally  Skin: Two 3 to 4 mm mildly erythematous papules at the left upper back.  No vesicle.  Lab Results:  Lab Results  Component Value Date   WBC 4.9 03/09/2023   HGB 12.1 03/09/2023   HCT 37.0 03/09/2023   MCV 88.5 03/09/2023   PLT 237 03/09/2023   NEUTROABS 3.2 03/09/2023    CMP  Lab Results  Component Value Date   NA 134 (L) 03/09/2023   K 4.8 03/09/2023   CL 103 03/09/2023   CO2 22 03/09/2023   GLUCOSE 81 03/09/2023   BUN 19 03/09/2023   CREATININE 0.89 03/09/2023   CALCIUM 9.4 03/09/2023   PROT 7.0 03/09/2023   ALBUMIN 3.9 03/09/2023   AST 36 03/09/2023   ALT 23 03/09/2023   ALKPHOS 81 03/09/2023   BILITOT 0.4 03/09/2023   GFRNONAA >60 03/09/2023   GFRAA >60 06/17/2020    Lab Results  Component Value Date   CEA1 1.15 03/04/2021   CEA 1.16 03/04/2021    Medications: I have reviewed the patient's current medications.   Assessment/Plan: Anal cancer CT abdomen/pelvis 10/21/2016-thickening of the anus extending to the junction between the anus and rectum with a large stool ball in the rectum and mild fat stranding posterior to the rectum. Enlarged lymph nodes in the right and left inguinal  regions. A few mildly prominent nodes seen posterior to the rectum. 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 PET scan 11/10/2016-markedly hypermetabolic anal mass with hypermetabolic metastases to the groin region bilaterally, left pelvic sidewall and presacral space. Initiation of radiation and cycle 1 5-FU/mitomycin C 11/14/2016 Cycle 2 5-FU/mitomycin C 12/12/2016 (5-FU dose reduced due to mucositis, diarrhea, skin breakdown) Radiation completed 12/23/2016 CT abdomen/pelvis 04/14/2017-resolution of anal mass and bilateral inguinal lymphadenopathy. No residual tumor seen. CT abdomen/pelvis 01/28/2020-right hydroureteronephrosis, transition at the level of the mid right ureter, retroperitoneal lymphadenopathy CT renal stone study 01/28/2020-severe right hydronephrosis and proximal right hydroureter, 8 mm area of increased attenuation in the proximal right ureter-stone versus soft tissue mass PET 02/13/2020-hypermetabolic right retroperitoneal nodal metastases in the aortocaval and posterior pericaval chains, nonspecific mild anal wall hypermetabolism, right nephroureteral stent 02/21/2020 anorectal exam per Dr. Theadora Rama radiation changes of the skin around the anal margin.  Posterior midline smooth scarring.  Mildly stenotic.  Stenosis seems better.  No palpable concerning lesions.  Entire anal canal and distal rectum feels smooth and healthy.  Partial anoscopy performed with no lesions in the anal canal. Xeloda/radiation beginning 03/02/2020, completed 04/13/2020 CT abdomen/pelvis 06/17/2020-resolution of right retroperitoneal lymphadenopathy, no remaining pathologically enlarged lymph nodes, residual mild right hydronephrosis, no evidence of progressive disease, stable anal wall thickening Digital rectal exam by Dr. Roxan Hockey 08/26/2020-posterior midline smooth scarring.  Mildly stenotic.  Stenosis seems better.  No  palpable concerning lesions.  Anal canal feels smooth without  palpable concern.  Unable to tolerate anoscopy.  Next visit in 6 months for surveillance. CT abdomen/pelvis 10/28/2020-no evidence of local recurrence or metastatic disease.  Stable mild rectal wall thickening.  New diffuse bladder wall thickening possibly related to prior radiation CT abdomen/pelvis 01/16/2021- recurrent right-sided hydronephrosis and proximal right hydroureter status post removal of right ureter stent, new soft tissue nodule along the course of the right ureter between the right psoas and IVC suspicious for recurrent lymphadenopathy PET 02/12/2021-small focus of hypermetabolism at the anorectal junction without a definable mass, right retroperitoneal nodal metastasis with obstruction of the proximal right ureter, right middle lobe hypermetabolic nodule, hypermetabolism in the right hilum without a defined nodal mass, hypermetabolic left supraclavicular and left axillary nodes 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 Cycle 1 Taxol/carboplatin 03/11/2021 Cycle 2 Taxol/carboplatin 03/31/2021, Fulphila Cycle 3 Taxol/carboplatin 04/21/2021, Fulphila CTs 05/11/2021-decrease size of FDG avid nodule in right middle lobe, right middle lobe subpleural nodule slightly enlarged, decreased in size of left axillary and subpectoral nodes, unchanged FDG avid right iliac nodes Cycle 4 Taxol/carboplatin 05/12/2021, G-CSF discontinued secondary to poor tolerance, Taxol and carboplatin dose reduced Cycle 5 Taxol/carboplatin 06/02/2021 Cycle 6 carboplatin 07/01/2021, Taxol held due to neuropathy Cycle 7 carboplatin 07/29/2021, Taxol held due to neuropathy CTs 08/16/2021-new liver lesion, increase in right psoas lymph node, unchanged right hydronephrosis, new mediastinal and right hilar lymphadenopathy, enlargement of bilateral pulmonary nodules and at least one new nodule, new subcarinal node Cycle 1 nivolumab 08/25/2021 Cycle 2 nivolumab 09/08/2021 Cycle 3  nivolumab 09/22/2021 Cycle 4 nivolumab 10/06/2021 Cycle 5 nivolumab 10/20/2021 Cycle 6 nivolumab 11/04/2021 CTs 11/12/2021-resolution of lung nodules, decreased size of right psoas node, no remaining mediastinal lymphadenopathy, resolution of right liver lesion Cycle 7 nivolumab 11/17/2021 Cycle 8 nivolumab 12/01/2021 Cycle 9 nivolumab 12/15/2021 Cycle 10 nivolumab 12/30/2021 Cycle 11 nivolumab 01/12/2022 Cycle 12 nivolumab 5/10/269-monthly dosing Cycle 13 nivolumab 02/23/2022 CT 03/16/2022-stable right psoas mass, no pulmonary nodules, persistent circumferential anal thickening, unchanged subcentimeter left liver lesion Cycle 14 nivolumab 03/24/2022 Cycle 15 nivolumab 04/21/2022 Cycle 16 nivolumab 05/19/2022 Cycle 17 nivolumab 06/16/2022 Cycle 18 nivolumab 07/14/2022 CTs 08/16/2022-stable ill-defined soft tissue in the right retroperitoneum, very mild intra and EXTR hepatic biliary duct dilation, common bile duct dilated to 11 mm in the porta hepatis Cycle 19 nivolumab 08/18/2022 Cycle 20 nivolumab 09/15/2022 Cycle 21 nivolumab 10/18/2022 Cycle 22 nivolumab 11/15/2022 Cycle 23 nivolumab 12/14/2022 Cycle 24 nivolumab 01/12/2023 CTs 02/07/2023-slight decrease in size of the right psoas mass, no other evidence of metastatic disease Cycle 25 nivolumab 02/09/2023 Cycle 26 nivolumab 03/09/2023 Left labial lesions. Question direct extension from the anal cancer versus metastatic disease from anal cancer versus a separate malignant process. 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 Multiple family members with breast cancer. History of hypokalemia-likely secondary to decreased nutritional intake and diarrhea; potassium in normal range 01/16/2017. No longer taking a potassium supplement. Right hydroureteronephrosis on CT 01/28/2020, status post a cystoscopy/pyelogram 01/28/2020 confirming extrinsic compression of the  right ureter, status post stent placement Right ureter stent exchange 06/12/2020 Right ureter stent removed 01/13/2021   8.  Admission 01/07/2021 with Klebsiella pneumoniae urosepsis/bacteremia  9.  Hospital admission 05/15/2021 with UTI/possible right pyelonephritis       Disposition: Ms. Kohr appears unchanged.  The lesions at the left upper back may be insect bites.  She does  not appear to have a zoster rash.  She is in clinical remission from anal cancer.  She will complete another treatment with nivolumab today.  We will check a urinalysis and culture today.  She will return for an office visit in nivolumab in 1 month.  Thornton Papas, MD  03/09/2023  12:23 PM

## 2023-03-09 NOTE — Patient Instructions (Signed)
[facility] [facility]   Discharge Instructions: Thank you for choosing [facility] to provide your oncology and hematology care.   If you have a lab appointment with the [facility], please go directly to the [facility] and check in at the registration area.   Wear comfortable clothing and clothing appropriate for easy access to any Portacath or PICC line.   We strive to give you quality time with your provider. You may need to reschedule your appointment if you arrive late (15 or more minutes).  Arriving late affects you and other patients whose appointments are after yours.  Also, if you miss three or more appointments without notifying the office, you may be dismissed from the clinic at the provider's discretion.      For prescription refill requests, have your pharmacy contact our office and allow 72 hours for refills to be completed.    Today you received the following chemotherapy and/or immunotherapy agents Nivolumab (OPDIVO).      To help prevent nausea and vomiting after your treatment, we encourage you to take your nausea medication as directed.  BELOW ARE SYMPTOMS THAT SHOULD BE REPORTED IMMEDIATELY: *FEVER GREATER THAN 100.4 F (38 C) OR HIGHER *CHILLS OR SWEATING *NAUSEA AND VOMITING THAT IS NOT CONTROLLED WITH YOUR NAUSEA MEDICATION *UNUSUAL SHORTNESS OF BREATH *UNUSUAL BRUISING OR BLEEDING *URINARY PROBLEMS (pain or burning when urinating, or frequent urination) *BOWEL PROBLEMS (unusual diarrhea, constipation, pain near the anus) TENDERNESS IN MOUTH AND THROAT WITH OR WITHOUT PRESENCE OF ULCERS (sore throat, sores in mouth, or a toothache) UNUSUAL RASH, SWELLING OR PAIN  UNUSUAL VAGINAL DISCHARGE OR ITCHING   Items with * indicate a potential emergency and should be followed up as soon as possible or go to the Emergency Department if any problems should occur.  Please show the CHEMOTHERAPY ALERT CARD or IMMUNOTHERAPY ALERT CARD  at check-in to the Emergency Department and triage nurse.  Should you have questions after your visit or need to cancel or reschedule your appointment, please contact [facility]  Dept: [phone]  and follow the prompts.  Office hours are 8:00 a.m. to 4:30 p.m. Monday - Friday. Please note that voicemails left after 4:00 p.m. may not be returned until the following business day.  We are closed weekends and major holidays. You have access to a nurse at all times for urgent questions. Please call the main number to the clinic Dept: [phone] and follow the prompts.   For any non-urgent questions, you may also contact your provider using MyChart. We now offer e-Visits for anyone 28 and older to request care online for non-urgent symptoms. For details visit [email or web address].   Also download the MyChart app! Go to the app store, search "MyChart", open the app, select [address], and log in with your MyChart username and password.  Nivolumab Injection What is this medication? NIVOLUMAB (nye VOL ue mab) treats some types of cancer. It works by helping your immune system slow or stop the spread of cancer cells. It is a monoclonal antibody. This medicine may be used for other purposes; ask your health care provider or pharmacist if you have questions. COMMON BRAND NAME(S): Opdivo What should I tell my care team before I take this medication? They need to know if you have any of these conditions: Allogeneic stem cell transplant (uses someone else's stem cells) Autoimmune diseases, such as Crohn disease, ulcerative colitis, lupus History of chest radiation Nervous system  as Guillain-Barre syndrome or myasthenia gravis Organ transplant An unusual or allergic reaction to nivolumab, other medications, foods, dyes, or preservatives Pregnant or trying to get pregnant Breast-feeding How should I use this medication? This medication is infused  into a vein. It is given in a hospital or clinic setting. A special MedGuide will be given to you before each treatment. Be sure to read this information carefully each time. Talk to your care team about the use of this medication in children. While it may be prescribed for children as young as 12 years for selected conditions, precautions do apply. Overdosage: If you think you have taken too much of this medicine contact a poison control center or emergency room at once. NOTE: This medicine is only for you. Do not share this medicine with others. What if I miss a dose? Keep appointments for follow-up doses. It is important not to miss your dose. Call your care team if you are unable to keep an appointment. What may interact with this medication? Interactions have not been studied. This list may not describe all possible interactions. Give your health care provider a list of all the medicines, herbs, non-prescription drugs, or dietary supplements you use. Also tell them if you smoke, drink alcohol, or use illegal drugs. Some items may interact with your medicine. What should I watch for while using this medication? Your condition will be monitored carefully while you are receiving this medication. You may need blood work while taking this medication. This medication may cause serious skin reactions. They can happen weeks to months after starting the medication. Contact your care team right away if you notice fevers or flu-like symptoms with a rash. The rash may be red or purple and then turn into blisters or peeling of the skin. You may also notice a red rash with swelling of the face, lips, or lymph nodes in your neck or under your arms. Tell your care team right away if you have any change in your eyesight. Talk to your care team if you are pregnant or think you might be pregnant. A negative pregnancy test is required before starting this medication. A reliable form of contraception is recommended  while taking this medication and for 5 months after the last dose. Talk to your care team about effective forms of contraception. Do not breast-feed while taking this medication and for 5 months after the last dose. What side effects may I notice from receiving this medication? Side effects that you should report to your care team as soon as possible: Allergic reactions--skin rash, itching, hives, swelling of the face, lips, tongue, or throat Dry cough, shortness of breath or trouble breathing Eye pain, redness, irritation, or discharge with blurry or decreased vision Heart muscle inflammation--unusual weakness or fatigue, shortness of breath, chest pain, fast or irregular heartbeat, dizziness, swelling of the ankles, feet, or hands Hormone gland problems--headache, sensitivity to light, unusual weakness or fatigue, dizziness, fast or irregular heartbeat, increased sensitivity to cold or heat, excessive sweating, constipation, hair loss, increased thirst or amount of urine, tremors or shaking, irritability Infusion reactions--chest pain, shortness of breath or trouble breathing, feeling faint or lightheaded Kidney injury (glomerulonephritis)--decrease in the amount of urine, red or dark brown urine, foamy or bubbly urine, swelling of the ankles, hands, or feet Liver injury--right upper belly pain, loss of appetite, nausea, light-colored stool, dark yellow or brown urine, yellowing skin or eyes, unusual weakness or fatigue Pain, tingling, or numbness in the hands or feet, muscle weakness,   muscle weakness, change in vision, confusion or trouble speaking, loss of balance or coordination, trouble walking, seizures Rash, fever, and swollen lymph nodes Redness, blistering, peeling, or loosening of the skin, including inside the mouth Sudden or severe stomach pain, bloody diarrhea, fever, nausea, vomiting Side effects that usually do not require medical attention (report these to your care team if they continue or are  bothersome): Bone, joint, or muscle pain Diarrhea Fatigue Loss of appetite Nausea Skin rash This list may not describe all possible side effects. Call your doctor for medical advice about side effects. You may report side effects to FDA at 1-800-FDA-1088. Where should I keep my medication? This medication is given in a hospital or clinic. It will not be stored at home. NOTE: This sheet is a summary. It may not cover all possible information. If you have questions about this medicine, talk to your doctor, pharmacist, or health care provider.  2024 Elsevier/Gold Standard (2022-01-03 00:00:00)

## 2023-03-09 NOTE — Progress Notes (Signed)
Patient seen by Dr. Sherrill today ? ?Vitals are within treatment parameters. ? ?Labs reviewed by Dr. Sherrill and are within treatment parameters. ? ?Per physician team, patient is ready for treatment and there are NO modifications to the treatment plan.  ?

## 2023-03-10 ENCOUNTER — Other Ambulatory Visit: Payer: Self-pay

## 2023-03-10 ENCOUNTER — Telehealth: Payer: Self-pay

## 2023-03-10 ENCOUNTER — Telehealth: Payer: Self-pay | Admitting: *Deleted

## 2023-03-10 LAB — URINE CULTURE: Culture: NO GROWTH

## 2023-03-10 NOTE — Telephone Encounter (Signed)
Patient gave verbal understanding and had no further question or concern. 

## 2023-03-10 NOTE — Telephone Encounter (Signed)
-----   Message from Ladene Artist, MD sent at 03/10/2023  2:29 PM EDT ----- Please call patient, urine culture is negative

## 2023-03-10 NOTE — Telephone Encounter (Signed)
Left VM that her urinalysis is consistent with an infection as in the past, we will wait on culture and decide on antibiotics. Also sent message via MyChart.

## 2023-03-10 NOTE — Telephone Encounter (Signed)
-----   Message from Ladene Artist, MD sent at 03/09/2023  5:24 PM EDT ----- Please call patient, urinalysis is consistent with an infection as in the past, we will wait on culture and decide on antibiotics

## 2023-03-18 ENCOUNTER — Other Ambulatory Visit: Payer: Self-pay

## 2023-03-27 ENCOUNTER — Other Ambulatory Visit: Payer: Self-pay | Admitting: *Deleted

## 2023-03-27 DIAGNOSIS — C772 Secondary and unspecified malignant neoplasm of intra-abdominal lymph nodes: Secondary | ICD-10-CM

## 2023-03-27 DIAGNOSIS — C210 Malignant neoplasm of anus, unspecified: Secondary | ICD-10-CM

## 2023-03-27 MED ORDER — LORAZEPAM 0.5 MG PO TABS
0.5000 mg | ORAL_TABLET | Freq: Every day | ORAL | 0 refills | Status: DC | PRN
Start: 2023-03-27 — End: 2023-05-04

## 2023-03-27 NOTE — Telephone Encounter (Signed)
Called to request refill on lorazepam

## 2023-04-05 ENCOUNTER — Encounter: Payer: Self-pay | Admitting: Physician Assistant

## 2023-04-05 ENCOUNTER — Ambulatory Visit: Payer: Medicare HMO | Attending: Physician Assistant | Admitting: Physician Assistant

## 2023-04-05 VITALS — BP 108/60 | HR 72 | Ht 62.0 in | Wt 165.4 lb

## 2023-04-05 DIAGNOSIS — I2510 Atherosclerotic heart disease of native coronary artery without angina pectoris: Secondary | ICD-10-CM | POA: Insufficient documentation

## 2023-04-05 NOTE — Progress Notes (Signed)
  Cardiology Office Note:    Date:  04/05/2023  ID:  Ashley Moore, DOB May 31, 1951, MRN [id number] PCP: Madelin Headings, MD  [facility] Providers Cardiologist:  Kristeen Miss, MD Cardiology APP:  Beatrice Lecher, PA-C       Patient Profile:      Coronary artery calcification by CT scan Noncardiac chest pain Aortic atherosclerosis Metastatic anal cancer          Discussed the use of AI scribe software for clinical note transcription with the patient, who gave verbal consent to proceed. History of Present Illness   The patient, a 71 year old female with a history of coronary artery calcification and metastatic anal cancer, presents for a routine follow-up visit. She reports no chest discomfort, shortness of breath, or syncope. She does, however, report chronic bilateral leg edema, which she attributes to radiation therapy. Despite using compression stockings and elevating her legs, the edema varies in severity. The patient follows a healthy diet and exercises regularly, which she believes contributes to her cancer remission and low LDL cholesterol level. She has not been on statin therapy and her LDL was 50 in July 2023.     ROS: see HPI    Studies Reviewed:   EKG Interpretation Date/Time:  Wednesday April 05 2023 13:38:22 EDT Ventricular Rate:  72 PR Interval:  170 QRS Duration:  90 QT Interval:  384 QTC Calculation: 420 R Axis:   68  Text Interpretation: Normal sinus rhythm Normal ECG Confirmed by Tereso Newcomer [phone]) on 04/05/2023 1:44:14 PM   Risk Assessment/Calculations:             Physical Exam:   VS:  BP 108/60   Pulse 72   Ht 5\' 2"  (1.575 m)   Wt 165 lb 6.4 oz (75 kg)   SpO2 98%   BMI 30.25 kg/m    Wt Readings from Last 3 Encounters:  04/05/23 165 lb 6.4 oz (75 kg)  03/09/23 167 lb (75.8 kg)  02/09/23 167 lb (75.8 kg)    Physical Exam   GEN: Well nourished, well developed, no acute distress VASCULAR: Carotid arteries without bruits,  bilaterally. CHEST: Lungs clear to auscultation, bilaterally no RALs. CARDIOVASCULAR: Cardiac normal S1, S2, regular rate rhythm of murmur. EXTREMITIES: Trace to one plus bilateral edema.         Assessment and Plan     Coronary Artery Calcification: No symptoms of angina. LDL was optimal at 50 in July 2023 without statin therapy. -Continue current diet and lifestyle modifications. -Follow up in 1 year.        Dispo:  Return in about 1 year (around 04/04/2024) for Routine Follow Up, w/ Dr. Elease Hashimoto, or Tereso Newcomer, PA-C.  Signed, Tereso Newcomer, PA-C

## 2023-04-05 NOTE — Patient Instructions (Signed)
Medication Instructions:  Your physician recommends that you continue on your current medications as directed. Please refer to the Current Medication list given to you today.  *If you need a refill on your cardiac medications before your next appointment, please call your pharmacy*   Lab Work: None ordered  If you have labs (blood work) drawn today and your tests are completely normal, you will receive your results only by: MyChart Message (if you have MyChart) OR A paper copy in the mail If you have any lab test that is abnormal or we need to change your treatment, we will call you to review the results.   Testing/Procedures: None ordered   Follow-Up: At [facility], you and your health needs are our priority.  As part of our continuing mission to provide you with exceptional heart care, we have created designated Provider Care Teams.  These Care Teams include your primary Cardiologist (physician) and Advanced Practice Providers (APPs -  Physician Assistants and Nurse Practitioners) who all work together to provide you with the care you need, when you need it.  We recommend signing up for the patient portal called "MyChart".  Sign up information is provided on this After Visit Summary.  MyChart is used to connect with patients for Virtual Visits (Telemedicine).  Patients are able to view lab/test results, encounter notes, upcoming appointments, etc.  Non-urgent messages can be sent to your provider as well.   To learn more about what you can do with MyChart, go to [email or web address].    Your next appointment:   12 month(s)  Provider:   None  or Tereso Newcomer, PA-C         Other Instructions

## 2023-04-06 ENCOUNTER — Inpatient Hospital Stay: Payer: Medicare HMO | Attending: Nurse Practitioner

## 2023-04-06 ENCOUNTER — Inpatient Hospital Stay: Payer: Medicare HMO | Admitting: Nurse Practitioner

## 2023-04-06 ENCOUNTER — Inpatient Hospital Stay: Payer: Medicare HMO

## 2023-04-06 ENCOUNTER — Encounter: Payer: Self-pay | Admitting: Nurse Practitioner

## 2023-04-06 VITALS — BP 138/83 | HR 74

## 2023-04-06 VITALS — BP 129/69 | HR 85 | Temp 98.1°F | Resp 18 | Ht 62.0 in | Wt 164.0 lb

## 2023-04-06 DIAGNOSIS — C210 Malignant neoplasm of anus, unspecified: Secondary | ICD-10-CM | POA: Diagnosis not present

## 2023-04-06 DIAGNOSIS — C778 Secondary and unspecified malignant neoplasm of lymph nodes of multiple regions: Secondary | ICD-10-CM | POA: Diagnosis not present

## 2023-04-06 DIAGNOSIS — Z5112 Encounter for antineoplastic immunotherapy: Secondary | ICD-10-CM | POA: Insufficient documentation

## 2023-04-06 LAB — CMP (CANCER CENTER ONLY)
ALT: 21 U/L (ref 0–44)
AST: 34 U/L (ref 15–41)
Albumin: 4.1 g/dL (ref 3.5–5.0)
Alkaline Phosphatase: 84 U/L (ref 38–126)
Anion gap: 8 (ref 5–15)
BUN: 19 mg/dL (ref 8–23)
CO2: 24 mmol/L (ref 22–32)
Calcium: 9.7 mg/dL (ref 8.9–10.3)
Chloride: 101 mmol/L (ref 98–111)
Creatinine: 0.87 mg/dL (ref 0.44–1.00)
GFR, Estimated: >60 mL/min (ref >60)
Glucose, Bld: 88 mg/dL (ref 70–99)
Potassium: 4.7 mmol/L (ref 3.5–5.1)
Sodium: 133 mmol/L — ABNORMAL LOW (ref 135–145)
Total Bilirubin: 0.3 mg/dL (ref 0.3–1.2)
Total Protein: 7.6 g/dL (ref 6.5–8.1)

## 2023-04-06 LAB — CBC WITH DIFFERENTIAL (CANCER CENTER ONLY)
Abs Immature Granulocytes: 0.01 10*3/uL (ref 0.00–0.07)
Basophils Absolute: 0 10*3/uL (ref 0.0–0.1)
Basophils Relative: 1 %
Eosinophils Absolute: 0.2 10*3/uL (ref 0.0–0.5)
Eosinophils Relative: 4 %
HCT: 38.9 % (ref 36.0–46.0)
Hemoglobin: 12.5 g/dL (ref 12.0–15.0)
Immature Granulocytes: 0 %
Lymphocytes Relative: 16 %
Lymphs Abs: 0.9 10*3/uL (ref 0.7–4.0)
MCH: 28.8 pg (ref 26.0–34.0)
MCHC: 32.1 g/dL (ref 30.0–36.0)
MCV: 89.6 fL (ref 80.0–100.0)
Monocytes Absolute: 0.4 10*3/uL (ref 0.1–1.0)
Monocytes Relative: 8 %
Neutro Abs: 3.9 10*3/uL (ref 1.7–7.7)
Neutrophils Relative %: 71 %
Platelet Count: 226 10*3/uL (ref 150–400)
RBC: 4.34 MIL/uL (ref 3.87–5.11)
RDW: 14.4 % (ref 11.5–15.5)
WBC Count: 5.5 10*3/uL (ref 4.0–10.5)
nRBC: 0 % (ref 0.0–0.2)

## 2023-04-06 MED ORDER — SODIUM CHLORIDE 0.9 % IV SOLN: Freq: Once | INTRAVENOUS | Status: AC

## 2023-04-06 MED ORDER — SODIUM CHLORIDE 0.9 % IV SOLN
480.0000 mg | Freq: Once | INTRAVENOUS | Status: AC
  Administered 2023-04-06: 480 mg via INTRAVENOUS
  Filled 2023-04-06: qty 48

## 2023-04-06 NOTE — Progress Notes (Signed)
[facility] OFFICE PROGRESS NOTE   Diagnosis: Anal cancer  INTERVAL HISTORY:   Ashley Moore returns as scheduled.  She completed another treatment with nivolumab 03/09/2023.  She has had a single loose stool over the past 4 weeks.  1 time she saw some mucus in the stool.  No rash.  No nausea or vomiting.  Objective:  Vital signs in last 24 hours:  Blood pressure 129/69, pulse 85, temperature 98.1 F (36.7 C), temperature source Oral, resp. rate 18, height 5\' 2"  (1.575 m), weight 164 lb (74.4 kg), SpO2 100%.    HEENT: No thrush or ulcers. Lymphatics: No palpable apical or supraclavicular lymph nodes. Resp: Faint rales lower lung base bilaterally.  No respiratory distress. Cardio: Regular rate and rhythm. GI: No hepatosplenomegaly. Vascular: Trace edema lower leg bilaterally. Neuro: Alert and oriented.   Skin: No rash.   Lab Results:  Lab Results  Component Value Date   WBC 5.5 04/06/2023   HGB 12.5 04/06/2023   HCT 38.9 04/06/2023   MCV 89.6 04/06/2023   PLT 226 04/06/2023   NEUTROABS 3.9 04/06/2023    Imaging:  No results found.  Medications: I have reviewed the patient's current medications.  Assessment/Plan: Anal cancer CT abdomen/pelvis 10/21/2016-thickening of the anus extending to the junction between the anus and rectum with a large stool ball in the rectum and mild fat stranding posterior to the rectum. Enlarged lymph nodes in the right and left inguinal regions. A few mildly prominent nodes seen posterior to the rectum. 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 PET scan 11/10/2016-markedly hypermetabolic anal mass with hypermetabolic metastases to the groin region bilaterally, left pelvic sidewall and presacral space. Initiation of radiation and cycle 1 5-FU/mitomycin C 11/14/2016 Cycle 2 5-FU/mitomycin C 12/12/2016 (5-FU dose reduced due to mucositis, diarrhea, skin breakdown) Radiation completed 12/23/2016 CT  abdomen/pelvis 04/14/2017-resolution of anal mass and bilateral inguinal lymphadenopathy. No residual tumor seen. CT abdomen/pelvis 01/28/2020-right hydroureteronephrosis, transition at the level of the mid right ureter, retroperitoneal lymphadenopathy CT renal stone study 01/28/2020-severe right hydronephrosis and proximal right hydroureter, 8 mm area of increased attenuation in the proximal right ureter-stone versus soft tissue mass PET 02/13/2020-hypermetabolic right retroperitoneal nodal metastases in the aortocaval and posterior pericaval chains, nonspecific mild anal wall hypermetabolism, right nephroureteral stent 02/21/2020 anorectal exam per Dr. Theadora Rama radiation changes of the skin around the anal margin.  Posterior midline smooth scarring.  Mildly stenotic.  Stenosis seems better.  No palpable concerning lesions.  Entire anal canal and distal rectum feels smooth and healthy.  Partial anoscopy performed with no lesions in the anal canal. Xeloda/radiation beginning 03/02/2020, completed 04/13/2020 CT abdomen/pelvis 06/17/2020-resolution of right retroperitoneal lymphadenopathy, no remaining pathologically enlarged lymph nodes, residual mild right hydronephrosis, no evidence of progressive disease, stable anal wall thickening Digital rectal exam by Dr. Roxan Hockey 08/26/2020-posterior midline smooth scarring.  Mildly stenotic.  Stenosis seems better.  No palpable concerning lesions.  Anal canal feels smooth without palpable concern.  Unable to tolerate anoscopy.  Next visit in 6 months for surveillance. CT abdomen/pelvis 10/28/2020-no evidence of local recurrence or metastatic disease.  Stable mild rectal wall thickening.  New diffuse bladder wall thickening possibly related to prior radiation CT abdomen/pelvis 01/16/2021- recurrent right-sided hydronephrosis and proximal right hydroureter status post removal of right ureter stent, new soft tissue nodule along the course of the right ureter between the  right psoas and IVC suspicious for recurrent lymphadenopathy PET 02/12/2021-small focus of hypermetabolism at the anorectal junction without a definable mass,  right retroperitoneal nodal metastasis with obstruction of the proximal right ureter, right middle lobe hypermetabolic nodule, hypermetabolism in the right hilum without a defined nodal mass, hypermetabolic left supraclavicular and left axillary nodes 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 Cycle 1 Taxol/carboplatin 03/11/2021 Cycle 2 Taxol/carboplatin 03/31/2021, Fulphila Cycle 3 Taxol/carboplatin 04/21/2021, Fulphila CTs 05/11/2021-decrease size of FDG avid nodule in right middle lobe, right middle lobe subpleural nodule slightly enlarged, decreased in size of left axillary and subpectoral nodes, unchanged FDG avid right iliac nodes Cycle 4 Taxol/carboplatin 05/12/2021, G-CSF discontinued secondary to poor tolerance, Taxol and carboplatin dose reduced Cycle 5 Taxol/carboplatin 06/02/2021 Cycle 6 carboplatin 07/01/2021, Taxol held due to neuropathy Cycle 7 carboplatin 07/29/2021, Taxol held due to neuropathy CTs 08/16/2021-new liver lesion, increase in right psoas lymph node, unchanged right hydronephrosis, new mediastinal and right hilar lymphadenopathy, enlargement of bilateral pulmonary nodules and at least one new nodule, new subcarinal node Cycle 1 nivolumab 08/25/2021 Cycle 2 nivolumab 09/08/2021 Cycle 3 nivolumab 09/22/2021 Cycle 4 nivolumab 10/06/2021 Cycle 5 nivolumab 10/20/2021 Cycle 6 nivolumab 11/04/2021 CTs 11/12/2021-resolution of lung nodules, decreased size of right psoas node, no remaining mediastinal lymphadenopathy, resolution of right liver lesion Cycle 7 nivolumab 11/17/2021 Cycle 8 nivolumab 12/01/2021 Cycle 9 nivolumab 12/15/2021 Cycle 10 nivolumab 12/30/2021 Cycle 11 nivolumab 01/12/2022 Cycle 12 nivolumab 01/27/24-monthly dosing Cycle 13 nivolumab 02/23/2022 CT  03/16/2022-stable right psoas mass, no pulmonary nodules, persistent circumferential anal thickening, unchanged subcentimeter left liver lesion Cycle 14 nivolumab 03/24/2022 Cycle 15 nivolumab 04/21/2022 Cycle 16 nivolumab 05/19/2022 Cycle 17 nivolumab 06/16/2022 Cycle 18 nivolumab 07/14/2022 CTs 08/16/2022-stable ill-defined soft tissue in the right retroperitoneum, very mild intra and EXTR hepatic biliary duct dilation, common bile duct dilated to 11 mm in the porta hepatis Cycle 19 nivolumab 08/18/2022 Cycle 20 nivolumab 09/15/2022 Cycle 21 nivolumab 10/18/2022 Cycle 22 nivolumab 11/15/2022 Cycle 23 nivolumab 12/14/2022 Cycle 24 nivolumab 01/12/2023 CTs 02/07/2023-slight decrease in size of the right psoas mass, no other evidence of metastatic disease Cycle 25 nivolumab 02/09/2023 Cycle 26 nivolumab 03/09/2023 Cycle 27 nivolumab 04/06/2023 Left labial lesions. Question direct extension from the anal cancer versus metastatic disease from anal cancer versus a separate malignant process. 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 Multiple family members with breast cancer. History of hypokalemia-likely secondary to decreased nutritional intake and diarrhea; potassium in normal range 01/16/2017. No longer taking a potassium supplement. Right hydroureteronephrosis on CT 01/28/2020, status post a cystoscopy/pyelogram 01/28/2020 confirming extrinsic compression of the right ureter, status post stent placement Right ureter stent exchange 06/12/2020 Right ureter stent removed 01/13/2021   8.  Admission 01/07/2021 with Klebsiella pneumoniae urosepsis/bacteremia  9.  Hospital admission 05/15/2021 with UTI/possible right pyelonephritis  Disposition: [name] appears stable.  She has completed 26 cycles of nivolumab.  There is no clinical evidence of disease progression.  Plan to proceed with cycle 27 today as  scheduled.  CBC and chemistry panel reviewed.  Labs adequate to proceed as above.  She will return for follow-up and treatment in 1 month.   Lonna Cobb ANP/GNP-BC   04/06/2023  3:16 PM

## 2023-04-06 NOTE — Progress Notes (Signed)
Patient seen by Lisa Thomas NP today  Vitals are within treatment parameters.  Labs reviewed by Lisa Thomas NP and are within treatment parameters.  Per physician team, patient is ready for treatment and there are NO modifications to the treatment plan.     

## 2023-04-06 NOTE — Patient Instructions (Signed)
[place] [facility]   Discharge Instructions: Thank you for choosing [facility] to provide your oncology and hematology care.   If you have a lab appointment with the Cancer Center, please go directly to the Cancer Center and check in at the registration area.   Wear comfortable clothing and clothing appropriate for easy access to any Portacath or PICC line.   We strive to give you quality time with your provider. You may need to reschedule your appointment if you arrive late (15 or more minutes).  Arriving late affects you and other patients whose appointments are after yours.  Also, if you miss three or more appointments without notifying the office, you may be dismissed from the clinic at the provider's discretion.      For prescription refill requests, have your pharmacy contact our office and allow 72 hours for refills to be completed.    Today you received the following chemotherapy and/or immunotherapy agents Opdivo.      To help prevent nausea and vomiting after your treatment, we encourage you to take your nausea medication as directed.  BELOW ARE SYMPTOMS THAT SHOULD BE REPORTED IMMEDIATELY: *FEVER GREATER THAN 100.4 F (38 C) OR HIGHER *CHILLS OR SWEATING *NAUSEA AND VOMITING THAT IS NOT CONTROLLED WITH YOUR NAUSEA MEDICATION *UNUSUAL SHORTNESS OF BREATH *UNUSUAL BRUISING OR BLEEDING *URINARY PROBLEMS (pain or burning when urinating, or frequent urination) *BOWEL PROBLEMS (unusual diarrhea, constipation, pain near the anus) TENDERNESS IN MOUTH AND THROAT WITH OR WITHOUT PRESENCE OF ULCERS (sore throat, sores in mouth, or a toothache) UNUSUAL RASH, SWELLING OR PAIN  UNUSUAL VAGINAL DISCHARGE OR ITCHING   Items with * indicate a potential emergency and should be followed up as soon as possible or go to the Emergency Department if any problems should occur.  Please show the CHEMOTHERAPY ALERT CARD or IMMUNOTHERAPY ALERT CARD at check-in  to the Emergency Department and triage nurse.  Should you have questions after your visit or need to cancel or reschedule your appointment, please contact [place] [facility]  Dept: [phone]  and follow the prompts.  Office hours are 8:00 a.m. to 4:30 p.m. Monday - Friday. Please note that voicemails left after 4:00 p.m. may not be returned until the following business day.  We are closed weekends and major holidays. You have access to a nurse at all times for urgent questions. Please call the main number to the clinic Dept: [phone] and follow the prompts.   For any non-urgent questions, you may also contact your provider using MyChart. We now offer e-Visits for anyone 56 and older to request care online for non-urgent symptoms. For details visit [email or web address].   Also download the MyChart app! Go to the app store, search "MyChart", open the app, select , and log in with your MyChart username and password.  Nivolumab Injection What is this medication? NIVOLUMAB (nye VOL ue mab) treats some types of cancer. It works by helping your immune system slow or stop the spread of cancer cells. It is a monoclonal antibody. This medicine may be used for other purposes; ask your health care provider or pharmacist if you have questions. COMMON BRAND NAME(S): Opdivo What should I tell my care team before I take this medication? They need to know if you have any of these conditions: Allogeneic stem cell transplant (uses someone else's stem cells) Autoimmune diseases, such as Crohn disease, ulcerative colitis, lupus History of chest radiation Nervous system problems,  such as Guillain-Barre syndrome or myasthenia gravis Organ transplant An unusual or allergic reaction to nivolumab, other medications, foods, dyes, or preservatives Pregnant or trying to get pregnant Breast-feeding How should I use this medication? This medication is infused into a vein.  It is given in a hospital or clinic setting. A special MedGuide will be given to you before each treatment. Be sure to read this information carefully each time. Talk to your care team about the use of this medication in children. While it may be prescribed for children as young as 12 years for selected conditions, precautions do apply. Overdosage: If you think you have taken too much of this medicine contact a poison control center or emergency room at once. NOTE: This medicine is only for you. Do not share this medicine with others. What if I miss a dose? Keep appointments for follow-up doses. It is important not to miss your dose. Call your care team if you are unable to keep an appointment. What may interact with this medication? Interactions have not been studied. This list may not describe all possible interactions. Give your health care provider a list of all the medicines, herbs, non-prescription drugs, or dietary supplements you use. Also tell them if you smoke, drink alcohol, or use illegal drugs. Some items may interact with your medicine. What should I watch for while using this medication? Your condition will be monitored carefully while you are receiving this medication. You may need blood work while taking this medication. This medication may cause serious skin reactions. They can happen weeks to months after starting the medication. Contact your care team right away if you notice fevers or flu-like symptoms with a rash. The rash may be red or purple and then turn into blisters or peeling of the skin. You may also notice a red rash with swelling of the face, lips, or lymph nodes in your neck or under your arms. Tell your care team right away if you have any change in your eyesight. Talk to your care team if you are pregnant or think you might be pregnant. A negative pregnancy test is required before starting this medication. A reliable form of contraception is recommended while taking  this medication and for 5 months after the last dose. Talk to your care team about effective forms of contraception. Do not breast-feed while taking this medication and for 5 months after the last dose. What side effects may I notice from receiving this medication? Side effects that you should report to your care team as soon as possible: Allergic reactions--skin rash, itching, hives, swelling of the face, lips, tongue, or throat Dry cough, shortness of breath or trouble breathing Eye pain, redness, irritation, or discharge with blurry or decreased vision Heart muscle inflammation--unusual weakness or fatigue, shortness of breath, chest pain, fast or irregular heartbeat, dizziness, swelling of the ankles, feet, or hands Hormone gland problems--headache, sensitivity to light, unusual weakness or fatigue, dizziness, fast or irregular heartbeat, increased sensitivity to cold or heat, excessive sweating, constipation, hair loss, increased thirst or amount of urine, tremors or shaking, irritability Infusion reactions--chest pain, shortness of breath or trouble breathing, feeling faint or lightheaded Kidney injury (glomerulonephritis)--decrease in the amount of urine, red or dark brown urine, foamy or bubbly urine, swelling of the ankles, hands, or feet Liver injury--right upper belly pain, loss of appetite, nausea, light-colored stool, dark yellow or brown urine, yellowing skin or eyes, unusual weakness or fatigue Pain, tingling, or numbness in the hands or feet, muscle  weakness, change in vision, confusion or trouble speaking, loss of balance or coordination, trouble walking, seizures Rash, fever, and swollen lymph nodes Redness, blistering, peeling, or loosening of the skin, including inside the mouth Sudden or severe stomach pain, bloody diarrhea, fever, nausea, vomiting Side effects that usually do not require medical attention (report these to your care team if they continue or are  bothersome): Bone, joint, or muscle pain Diarrhea Fatigue Loss of appetite Nausea Skin rash This list may not describe all possible side effects. Call your doctor for medical advice about side effects. You may report side effects to FDA at 1-800-FDA-1088. Where should I keep my medication? This medication is given in a hospital or clinic. It will not be stored at home. NOTE: This sheet is a summary. It may not cover all possible information. If you have questions about this medicine, talk to your doctor, pharmacist, or health care provider.  2024 Elsevier/Gold Standard (2022-01-03 00:00:00)

## 2023-04-10 ENCOUNTER — Other Ambulatory Visit: Payer: Self-pay

## 2023-04-14 ENCOUNTER — Other Ambulatory Visit: Payer: Self-pay | Admitting: Internal Medicine

## 2023-04-27 ENCOUNTER — Other Ambulatory Visit: Payer: Self-pay | Admitting: Oncology

## 2023-05-01 DIAGNOSIS — M1712 Unilateral primary osteoarthritis, left knee: Secondary | ICD-10-CM | POA: Diagnosis not present

## 2023-05-04 ENCOUNTER — Inpatient Hospital Stay: Payer: Medicare HMO | Attending: Nurse Practitioner

## 2023-05-04 ENCOUNTER — Inpatient Hospital Stay: Payer: Medicare HMO | Admitting: Oncology

## 2023-05-04 ENCOUNTER — Inpatient Hospital Stay: Payer: Medicare HMO

## 2023-05-04 ENCOUNTER — Encounter: Payer: Self-pay | Admitting: *Deleted

## 2023-05-04 VITALS — BP 124/60 | HR 63 | Resp 18

## 2023-05-04 VITALS — BP 118/78 | HR 86 | Temp 98.1°F | Resp 18 | Ht 62.0 in | Wt 164.8 lb

## 2023-05-04 DIAGNOSIS — Z5112 Encounter for antineoplastic immunotherapy: Secondary | ICD-10-CM | POA: Diagnosis not present

## 2023-05-04 DIAGNOSIS — C770 Secondary and unspecified malignant neoplasm of lymph nodes of head, face and neck: Secondary | ICD-10-CM | POA: Insufficient documentation

## 2023-05-04 DIAGNOSIS — C210 Malignant neoplasm of anus, unspecified: Secondary | ICD-10-CM

## 2023-05-04 DIAGNOSIS — Z79620 Long term (current) use of immunosuppressive biologic: Secondary | ICD-10-CM | POA: Insufficient documentation

## 2023-05-04 LAB — CBC WITH DIFFERENTIAL (CANCER CENTER ONLY)
Abs Immature Granulocytes: 0.01 10*3/uL (ref 0.00–0.07)
Basophils Absolute: 0 10*3/uL (ref 0.0–0.1)
Basophils Relative: 1 %
Eosinophils Absolute: 0.2 10*3/uL (ref 0.0–0.5)
Eosinophils Relative: 5 %
HCT: 37.4 % (ref 36.0–46.0)
Hemoglobin: 12 g/dL (ref 12.0–15.0)
Immature Granulocytes: 0 %
Lymphocytes Relative: 13 %
Lymphs Abs: 0.6 10*3/uL — ABNORMAL LOW (ref 0.7–4.0)
MCH: 28.8 pg (ref 26.0–34.0)
MCHC: 32.1 g/dL (ref 30.0–36.0)
MCV: 89.7 fL (ref 80.0–100.0)
Monocytes Absolute: 0.4 10*3/uL (ref 0.1–1.0)
Monocytes Relative: 9 %
Neutro Abs: 3.4 10*3/uL (ref 1.7–7.7)
Neutrophils Relative %: 72 %
Platelet Count: 186 10*3/uL (ref 150–400)
RBC: 4.17 MIL/uL (ref 3.87–5.11)
RDW: 14.7 % (ref 11.5–15.5)
WBC Count: 4.6 10*3/uL (ref 4.0–10.5)
nRBC: 0 % (ref 0.0–0.2)

## 2023-05-04 LAB — CMP (CANCER CENTER ONLY)
ALT: 19 U/L (ref 0–44)
AST: 30 U/L (ref 15–41)
Albumin: 4 g/dL (ref 3.5–5.0)
Alkaline Phosphatase: 76 U/L (ref 38–126)
Anion gap: 8 (ref 5–15)
BUN: 18 mg/dL (ref 8–23)
CO2: 24 mmol/L (ref 22–32)
Calcium: 9.6 mg/dL (ref 8.9–10.3)
Chloride: 103 mmol/L (ref 98–111)
Creatinine: 0.84 mg/dL (ref 0.44–1.00)
GFR, Estimated: >60 mL/min (ref >60)
Glucose, Bld: 76 mg/dL (ref 70–99)
Potassium: 4.8 mmol/L (ref 3.5–5.1)
Sodium: 135 mmol/L (ref 135–145)
Total Bilirubin: 0.3 mg/dL (ref 0.3–1.2)
Total Protein: 7.5 g/dL (ref 6.5–8.1)

## 2023-05-04 LAB — TSH: TSH: 2.169 u[IU]/mL (ref 0.350–4.500)

## 2023-05-04 MED ORDER — SODIUM CHLORIDE 0.9 % IV SOLN: Freq: Once | INTRAVENOUS | Status: AC

## 2023-05-04 MED ORDER — SODIUM CHLORIDE 0.9 % IV SOLN
480.0000 mg | Freq: Once | INTRAVENOUS | Status: AC
  Administered 2023-05-04: 480 mg via INTRAVENOUS
  Filled 2023-05-04: qty 48

## 2023-05-04 NOTE — Progress Notes (Signed)
Patient seen by Dr. Sherrill today ? ?Vitals are within treatment parameters. ? ?Labs reviewed by Dr. Sherrill and are within treatment parameters. ? ?Per physician team, patient is ready for treatment and there are NO modifications to the treatment plan.  ?

## 2023-05-04 NOTE — Progress Notes (Signed)
[facility] OFFICE PROGRESS NOTE   Diagnosis: Anal cancer  INTERVAL HISTORY:   Ashley Moore completed another treatment with nivolumab on 04/06/2023.  No rash or diarrhea.  Good appetite and energy level.  She has stable leg swelling.  She has nocturia.  The area of induration at the left lower leg is less erythematous and lower in the leg.  Objective:  Vital signs in last 24 hours:  Blood pressure 118/78, pulse 86, temperature 98.1 F (36.7 C), temperature source Oral, resp. rate 18, height 5\' 2"  (1.575 m), weight 164 lb 12.8 oz (74.8 kg), SpO2 98%.    Lymphatics: No cervical, supraclavicular, or inguinal nodes Resp: End inspiratory rales at the left greater than right posterior chest, no respiratory distress Cardio: Regular rate and rhythm GI: No hepatosplenomegaly, no mass, tender in the right lower abdomen Vascular: Trace edema at the lower leg bilaterally, firm "woody "induration at the medial aspect of the left lower leg.  No erythema or palpable cord  Lab Results:  Lab Results  Component Value Date   WBC 4.6 05/04/2023   HGB 12.0 05/04/2023   HCT 37.4 05/04/2023   MCV 89.7 05/04/2023   PLT 186 05/04/2023   NEUTROABS 3.4 05/04/2023    CMP  Lab Results  Component Value Date   NA 135 05/04/2023   K 4.8 05/04/2023   CL 103 05/04/2023   CO2 24 05/04/2023   GLUCOSE 76 05/04/2023   BUN 18 05/04/2023   CREATININE 0.84 05/04/2023   CALCIUM 9.6 05/04/2023   PROT 7.5 05/04/2023   ALBUMIN 4.0 05/04/2023   AST 30 05/04/2023   ALT 19 05/04/2023   ALKPHOS 76 05/04/2023   BILITOT 0.3 05/04/2023   GFRNONAA >60 05/04/2023   GFRAA >60 06/17/2020    Lab Results  Component Value Date   CEA1 1.15 03/04/2021   CEA 1.16 03/04/2021    Lab Results  Component Value Date   INR 1.2 05/16/2021   LABPROT 15.2 05/16/2021    Imaging:  No results found.  Medications: I have reviewed the patient's current medications.   Assessment/Plan: Anal cancer CT  abdomen/pelvis 10/21/2016-thickening of the anus extending to the junction between the anus and rectum with a large stool ball in the rectum and mild fat stranding posterior to the rectum. Enlarged lymph nodes in the right and left inguinal regions. A few mildly prominent nodes seen posterior to the rectum. 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 PET scan 11/10/2016-markedly hypermetabolic anal mass with hypermetabolic metastases to the groin region bilaterally, left pelvic sidewall and presacral space. Initiation of radiation and cycle 1 5-FU/mitomycin C 11/14/2016 Cycle 2 5-FU/mitomycin C 12/12/2016 (5-FU dose reduced due to mucositis, diarrhea, skin breakdown) Radiation completed 12/23/2016 CT abdomen/pelvis 04/14/2017-resolution of anal mass and bilateral inguinal lymphadenopathy. No residual tumor seen. CT abdomen/pelvis 01/28/2020-right hydroureteronephrosis, transition at the level of the mid right ureter, retroperitoneal lymphadenopathy CT renal stone study 01/28/2020-severe right hydronephrosis and proximal right hydroureter, 8 mm area of increased attenuation in the proximal right ureter-stone versus soft tissue mass PET 02/13/2020-hypermetabolic right retroperitoneal nodal metastases in the aortocaval and posterior pericaval chains, nonspecific mild anal wall hypermetabolism, right nephroureteral stent 02/21/2020 anorectal exam per Dr. Theadora Rama radiation changes of the skin around the anal margin.  Posterior midline smooth scarring.  Mildly stenotic.  Stenosis seems better.  No palpable concerning lesions.  Entire anal canal and distal rectum feels smooth and healthy.  Partial anoscopy performed with no lesions in the anal canal. Xeloda/radiation  beginning 03/02/2020, completed 04/13/2020 CT abdomen/pelvis 06/17/2020-resolution of right retroperitoneal lymphadenopathy, no remaining pathologically enlarged lymph nodes, residual mild right hydronephrosis, no evidence of  progressive disease, stable anal wall thickening Digital rectal exam by Dr. Roxan Hockey 08/26/2020-posterior midline smooth scarring.  Mildly stenotic.  Stenosis seems better.  No palpable concerning lesions.  Anal canal feels smooth without palpable concern.  Unable to tolerate anoscopy.  Next visit in 6 months for surveillance. CT abdomen/pelvis 10/28/2020-no evidence of local recurrence or metastatic disease.  Stable mild rectal wall thickening.  New diffuse bladder wall thickening possibly related to prior radiation CT abdomen/pelvis 01/16/2021- recurrent right-sided hydronephrosis and proximal right hydroureter status post removal of right ureter stent, new soft tissue nodule along the course of the right ureter between the right psoas and IVC suspicious for recurrent lymphadenopathy PET 02/12/2021-small focus of hypermetabolism at the anorectal junction without a definable mass, right retroperitoneal nodal metastasis with obstruction of the proximal right ureter, right middle lobe hypermetabolic nodule, hypermetabolism in the right hilum without a defined nodal mass, hypermetabolic left supraclavicular and left axillary nodes 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 Cycle 1 Taxol/carboplatin 03/11/2021 Cycle 2 Taxol/carboplatin 03/31/2021, Fulphila Cycle 3 Taxol/carboplatin 04/21/2021, Fulphila CTs 05/11/2021-decrease size of FDG avid nodule in right middle lobe, right middle lobe subpleural nodule slightly enlarged, decreased in size of left axillary and subpectoral nodes, unchanged FDG avid right iliac nodes Cycle 4 Taxol/carboplatin 05/12/2021, G-CSF discontinued secondary to poor tolerance, Taxol and carboplatin dose reduced Cycle 5 Taxol/carboplatin 06/02/2021 Cycle 6 carboplatin 07/01/2021, Taxol held due to neuropathy Cycle 7 carboplatin 07/29/2021, Taxol held due to neuropathy CTs 08/16/2021-new liver lesion, increase in right psoas lymph node,  unchanged right hydronephrosis, new mediastinal and right hilar lymphadenopathy, enlargement of bilateral pulmonary nodules and at least one new nodule, new subcarinal node Cycle 1 nivolumab 08/25/2021 Cycle 2 nivolumab 09/08/2021 Cycle 3 nivolumab 09/22/2021 Cycle 4 nivolumab 10/06/2021 Cycle 5 nivolumab 10/20/2021 Cycle 6 nivolumab 11/04/2021 CTs 11/12/2021-resolution of lung nodules, decreased size of right psoas node, no remaining mediastinal lymphadenopathy, resolution of right liver lesion Cycle 7 nivolumab 11/17/2021 Cycle 8 nivolumab 12/01/2021 Cycle 9 nivolumab 12/15/2021 Cycle 10 nivolumab 12/30/2021 Cycle 11 nivolumab 01/12/2022 Cycle 12 nivolumab 5/10/266-monthly dosing Cycle 13 nivolumab 02/23/2022 CT 03/16/2022-stable right psoas mass, no pulmonary nodules, persistent circumferential anal thickening, unchanged subcentimeter left liver lesion Cycle 14 nivolumab 03/24/2022 Cycle 15 nivolumab 04/21/2022 Cycle 16 nivolumab 05/19/2022 Cycle 17 nivolumab 06/16/2022 Cycle 18 nivolumab 07/14/2022 CTs 08/16/2022-stable ill-defined soft tissue in the right retroperitoneum, very mild intra and EXTR hepatic biliary duct dilation, common bile duct dilated to 11 mm in the porta hepatis Cycle 19 nivolumab 08/18/2022 Cycle 20 nivolumab 09/15/2022 Cycle 21 nivolumab 10/18/2022 Cycle 22 nivolumab 11/15/2022 Cycle 23 nivolumab 12/14/2022 Cycle 24 nivolumab 01/12/2023 CTs 02/07/2023-slight decrease in size of the right psoas mass, no other evidence of metastatic disease Cycle 25 nivolumab 02/09/2023 Cycle 26 nivolumab 03/09/2023 Cycle 27 nivolumab 04/06/2023 Cycle 28 nivolumab 05/04/2023 Left labial lesions. Question direct extension from the anal cancer versus metastatic disease from anal cancer versus a separate malignant process. 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 Multiple family members with breast  cancer. History of hypokalemia-likely secondary to decreased nutritional intake and diarrhea; potassium in normal range 01/16/2017. No longer taking a potassium supplement. Right hydroureteronephrosis on CT 01/28/2020, status post a cystoscopy/pyelogram 01/28/2020 confirming extrinsic compression of the right ureter, status post stent  placement Right ureter stent exchange 06/12/2020 Right ureter stent removed 01/13/2021   8.  Admission 01/07/2021 with Klebsiella pneumoniae urosepsis/bacteremia  9.  Hospital admission 05/15/2021 with UTI/possible right pyelonephritis    Disposition: Ashley Moore appears stable.  She is in clinical remission from anal cancer.  The plan is to complete 2 years of nivolumab.  She will complete another cycle today.  Ashley Moore will return for an office visit in nivolumab in 1 month.  Thornton Papas, MD  05/04/2023  12:14 PM

## 2023-05-04 NOTE — Patient Instructions (Signed)
[facility] [facility]   Discharge Instructions: Thank you for choosing [facility] to provide your oncology and hematology care.   If you have a lab appointment with the [facility], please go directly to the [facility] and check in at the registration area.   Wear comfortable clothing and clothing appropriate for easy access to any Portacath or PICC line.   We strive to give you quality time with your provider. You may need to reschedule your appointment if you arrive late (15 or more minutes).  Arriving late affects you and other patients whose appointments are after yours.  Also, if you miss three or more appointments without notifying the office, you may be dismissed from the clinic at the provider's discretion.      For prescription refill requests, have your pharmacy contact our office and allow 72 hours for refills to be completed.    Today you received the following chemotherapy and/or immunotherapy agents Nivolumab (OPDIVO).      To help prevent nausea and vomiting after your treatment, we encourage you to take your nausea medication as directed.  BELOW ARE SYMPTOMS THAT SHOULD BE REPORTED IMMEDIATELY: *FEVER GREATER THAN 100.4 F (38 C) OR HIGHER *CHILLS OR SWEATING *NAUSEA AND VOMITING THAT IS NOT CONTROLLED WITH YOUR NAUSEA MEDICATION *UNUSUAL SHORTNESS OF BREATH *UNUSUAL BRUISING OR BLEEDING *URINARY PROBLEMS (pain or burning when urinating, or frequent urination) *BOWEL PROBLEMS (unusual diarrhea, constipation, pain near the anus) TENDERNESS IN MOUTH AND THROAT WITH OR WITHOUT PRESENCE OF ULCERS (sore throat, sores in mouth, or a toothache) UNUSUAL RASH, SWELLING OR PAIN  UNUSUAL VAGINAL DISCHARGE OR ITCHING   Items with * indicate a potential emergency and should be followed up as soon as possible or go to the Emergency Department if any problems should occur.  Please show the CHEMOTHERAPY ALERT CARD or IMMUNOTHERAPY ALERT CARD  at check-in to the Emergency Department and triage nurse.  Should you have questions after your visit or need to cancel or reschedule your appointment, please contact [facility]  Dept: [phone]  and follow the prompts.  Office hours are 8:00 a.m. to 4:30 p.m. Monday - Friday. Please note that voicemails left after 4:00 p.m. may not be returned until the following business day.  We are closed weekends and major holidays. You have access to a nurse at all times for urgent questions. Please call the main number to the clinic Dept: [phone] and follow the prompts.   For any non-urgent questions, you may also contact your provider using MyChart. We now offer e-Visits for anyone 28 and older to request care online for non-urgent symptoms. For details visit [email or web address].   Also download the MyChart app! Go to the app store, search "MyChart", open the app, select [address], and log in with your MyChart username and password.  Nivolumab Injection What is this medication? NIVOLUMAB (nye VOL ue mab) treats some types of cancer. It works by helping your immune system slow or stop the spread of cancer cells. It is a monoclonal antibody. This medicine may be used for other purposes; ask your health care provider or pharmacist if you have questions. COMMON BRAND NAME(S): Opdivo What should I tell my care team before I take this medication? They need to know if you have any of these conditions: Allogeneic stem cell transplant (uses someone else's stem cells) Autoimmune diseases, such as Crohn disease, ulcerative colitis, lupus History of chest radiation Nervous system  as Guillain-Barre syndrome or myasthenia gravis Organ transplant An unusual or allergic reaction to nivolumab, other medications, foods, dyes, or preservatives Pregnant or trying to get pregnant Breast-feeding How should I use this medication? This medication is infused  into a vein. It is given in a hospital or clinic setting. A special MedGuide will be given to you before each treatment. Be sure to read this information carefully each time. Talk to your care team about the use of this medication in children. While it may be prescribed for children as young as 12 years for selected conditions, precautions do apply. Overdosage: If you think you have taken too much of this medicine contact a poison control center or emergency room at once. NOTE: This medicine is only for you. Do not share this medicine with others. What if I miss a dose? Keep appointments for follow-up doses. It is important not to miss your dose. Call your care team if you are unable to keep an appointment. What may interact with this medication? Interactions have not been studied. This list may not describe all possible interactions. Give your health care provider a list of all the medicines, herbs, non-prescription drugs, or dietary supplements you use. Also tell them if you smoke, drink alcohol, or use illegal drugs. Some items may interact with your medicine. What should I watch for while using this medication? Your condition will be monitored carefully while you are receiving this medication. You may need blood work while taking this medication. This medication may cause serious skin reactions. They can happen weeks to months after starting the medication. Contact your care team right away if you notice fevers or flu-like symptoms with a rash. The rash may be red or purple and then turn into blisters or peeling of the skin. You may also notice a red rash with swelling of the face, lips, or lymph nodes in your neck or under your arms. Tell your care team right away if you have any change in your eyesight. Talk to your care team if you are pregnant or think you might be pregnant. A negative pregnancy test is required before starting this medication. A reliable form of contraception is recommended  while taking this medication and for 5 months after the last dose. Talk to your care team about effective forms of contraception. Do not breast-feed while taking this medication and for 5 months after the last dose. What side effects may I notice from receiving this medication? Side effects that you should report to your care team as soon as possible: Allergic reactions--skin rash, itching, hives, swelling of the face, lips, tongue, or throat Dry cough, shortness of breath or trouble breathing Eye pain, redness, irritation, or discharge with blurry or decreased vision Heart muscle inflammation--unusual weakness or fatigue, shortness of breath, chest pain, fast or irregular heartbeat, dizziness, swelling of the ankles, feet, or hands Hormone gland problems--headache, sensitivity to light, unusual weakness or fatigue, dizziness, fast or irregular heartbeat, increased sensitivity to cold or heat, excessive sweating, constipation, hair loss, increased thirst or amount of urine, tremors or shaking, irritability Infusion reactions--chest pain, shortness of breath or trouble breathing, feeling faint or lightheaded Kidney injury (glomerulonephritis)--decrease in the amount of urine, red or dark brown urine, foamy or bubbly urine, swelling of the ankles, hands, or feet Liver injury--right upper belly pain, loss of appetite, nausea, light-colored stool, dark yellow or brown urine, yellowing skin or eyes, unusual weakness or fatigue Pain, tingling, or numbness in the hands or feet, muscle weakness,   muscle weakness, change in vision, confusion or trouble speaking, loss of balance or coordination, trouble walking, seizures Rash, fever, and swollen lymph nodes Redness, blistering, peeling, or loosening of the skin, including inside the mouth Sudden or severe stomach pain, bloody diarrhea, fever, nausea, vomiting Side effects that usually do not require medical attention (report these to your care team if they continue or are  bothersome): Bone, joint, or muscle pain Diarrhea Fatigue Loss of appetite Nausea Skin rash This list may not describe all possible side effects. Call your doctor for medical advice about side effects. You may report side effects to FDA at 1-800-FDA-1088. Where should I keep my medication? This medication is given in a hospital or clinic. It will not be stored at home. NOTE: This sheet is a summary. It may not cover all possible information. If you have questions about this medicine, talk to your doctor, pharmacist, or health care provider.  2024 Elsevier/Gold Standard (2022-01-03 00:00:00)

## 2023-05-06 ENCOUNTER — Other Ambulatory Visit: Payer: Self-pay

## 2023-05-06 LAB — T4: T4, Total: 9.1 ug/dL (ref 4.5–12.0)

## 2023-05-07 ENCOUNTER — Other Ambulatory Visit: Payer: Self-pay

## 2023-05-11 ENCOUNTER — Ambulatory Visit: Payer: Self-pay

## 2023-05-11 NOTE — Patient Outreach (Signed)
  Care Coordination   Follow Up Visit Note   05/11/2023 Name: Ashley Moore MRN: [id number] DOB: 09-21-1950  Ashley Moore is a 72 y.o. year old female who sees Panosh, Neta Mends, MD for primary care. I spoke with  [name] by phone today.  What matters to the patients health and wellness today?  Maintain health    Goals Addressed             This Visit's Progress    " Getting Stronger"       Patient Goals/Self Care Activities: -Patient/Caregiver will self-administer medications as prescribed as evidenced by self-report/primary caregiver report  -Patient/Caregiver will attend all scheduled provider appointments as evidenced by clinician review of documented attendance to scheduled appointments and patient/caregiver report -Patient/Caregiver will call provider office for new concerns or questions as evidenced by review of documented incoming telephone call notes and patient report     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  Advised patient to pace self with activities and rest when possible.  She verbalized understanding and reports she continues to do yoga as well. No concerns.           SDOH assessments and interventions completed:  Yes     Care Coordination Interventions:  Yes, provided   Follow up plan: Follow up call scheduled for October    Encounter Outcome:  Pt. Visit Completed   Bary Leriche, RN, MSN [place]  [facility], [facility] Management Community Coordinator Direct Dial: [phone]  Fax: [phone] Website: [email or web address]

## 2023-05-11 NOTE — Patient Instructions (Signed)
Visit Information  Thank you for taking time to visit with me today. Please don't hesitate to contact me if I can be of assistance to you.   Following are the goals we discussed today:   Goals Addressed             This Visit's Progress    " Getting Stronger"       Patient Goals/Self Care Activities: -Patient/Caregiver will self-administer medications as prescribed as evidenced by self-report/primary caregiver report  -Patient/Caregiver will attend all scheduled provider appointments as evidenced by clinician review of documented attendance to scheduled appointments and patient/caregiver report -Patient/Caregiver will call provider office for new concerns or questions as evidenced by review of documented incoming telephone call notes and patient report     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  Advised patient to pace self with activities and rest when possible.  She verbalized understanding and reports she continues to do yoga as well. No concerns.           Our next appointment is by telephone on 07/13/23 at 1200 pm  Please call the care guide team at [phone] if you need to cancel or reschedule your appointment.   If you are experiencing a Mental Health or Behavioral Health Crisis or need someone to talk to, please call the Suicide and Crisis Lifeline: 988   Patient verbalizes understanding of instructions and care plan provided today and agrees to view in MyChart. Active MyChart status and patient understanding of how to access instructions and care plan via MyChart confirmed with patient.     The patient has been provided with contact information for the care management team and has been advised to call with any health related questions or concerns.   Bary Leriche, RN, MSN [facility], [facility] Management Community  Coordinator Direct Dial: [phone]  Fax: [phone] Website: [email or web address]

## 2023-05-17 ENCOUNTER — Other Ambulatory Visit: Payer: Self-pay

## 2023-05-17 ENCOUNTER — Telehealth: Payer: Self-pay

## 2023-05-17 NOTE — Telephone Encounter (Signed)
A patient called with a question for the provider regarding the possibility of discontinuing her last three chemotherapy treatments. I consulted with Sherrill and recommended that the patient proceed with the remaining three treatments. The patient indicated that she would continue with the last three chemotherapy sessions.

## 2023-05-21 ENCOUNTER — Emergency Department (HOSPITAL_BASED_OUTPATIENT_CLINIC_OR_DEPARTMENT_OTHER): Payer: Medicare HMO

## 2023-05-21 ENCOUNTER — Other Ambulatory Visit: Payer: Self-pay

## 2023-05-21 ENCOUNTER — Emergency Department (HOSPITAL_BASED_OUTPATIENT_CLINIC_OR_DEPARTMENT_OTHER)
Admission: EM | Admit: 2023-05-21 | Discharge: 2023-05-21 | Disposition: A | Discharge status: Home / Self Care | Payer: Medicare HMO | Attending: Emergency Medicine | Admitting: Emergency Medicine

## 2023-05-21 ENCOUNTER — Encounter (HOSPITAL_BASED_OUTPATIENT_CLINIC_OR_DEPARTMENT_OTHER): Payer: Self-pay

## 2023-05-21 DIAGNOSIS — R319 Hematuria, unspecified: Secondary | ICD-10-CM | POA: Diagnosis present

## 2023-05-21 DIAGNOSIS — N2889 Other specified disorders of kidney and ureter: Secondary | ICD-10-CM | POA: Diagnosis not present

## 2023-05-21 DIAGNOSIS — N1330 Unspecified hydronephrosis: Secondary | ICD-10-CM | POA: Diagnosis not present

## 2023-05-21 DIAGNOSIS — N3000 Acute cystitis without hematuria: Secondary | ICD-10-CM | POA: Insufficient documentation

## 2023-05-21 DIAGNOSIS — C210 Malignant neoplasm of anus, unspecified: Secondary | ICD-10-CM | POA: Diagnosis not present

## 2023-05-21 DIAGNOSIS — N3289 Other specified disorders of bladder: Secondary | ICD-10-CM | POA: Diagnosis not present

## 2023-05-21 DIAGNOSIS — Z85048 Personal history of other malignant neoplasm of rectum, rectosigmoid junction, and anus: Secondary | ICD-10-CM | POA: Diagnosis not present

## 2023-05-21 LAB — URINALYSIS, ROUTINE W REFLEX MICROSCOPIC
Bacteria, UA: NONE SEEN
Bilirubin Urine: NEGATIVE
Glucose, UA: NEGATIVE mg/dL
Ketones, ur: NEGATIVE mg/dL
Nitrite: NEGATIVE
Specific Gravity, Urine: 1.009 (ref 1.005–1.030)
WBC, UA: >50 WBC/hpf (ref 0–5)
pH: 6 (ref 5.0–8.0)

## 2023-05-21 LAB — CBC
HCT: 36.6 % (ref 36.0–46.0)
Hemoglobin: 11.7 g/dL — ABNORMAL LOW (ref 12.0–15.0)
MCH: 28.7 pg (ref 26.0–34.0)
MCHC: 32 g/dL (ref 30.0–36.0)
MCV: 89.9 fL (ref 80.0–100.0)
Platelets: 244 10*3/uL (ref 150–400)
RBC: 4.07 MIL/uL (ref 3.87–5.11)
RDW: 14.6 % (ref 11.5–15.5)
WBC: 5.6 10*3/uL (ref 4.0–10.5)
nRBC: 0 % (ref 0.0–0.2)

## 2023-05-21 LAB — BASIC METABOLIC PANEL
Anion gap: 9 (ref 5–15)
BUN: 19 mg/dL (ref 8–23)
CO2: 24 mmol/L (ref 22–32)
Calcium: 9 mg/dL (ref 8.9–10.3)
Chloride: 104 mmol/L (ref 98–111)
Creatinine, Ser: 0.88 mg/dL (ref 0.44–1.00)
GFR, Estimated: >60 mL/min (ref >60)
Glucose, Bld: 85 mg/dL (ref 70–99)
Potassium: 4.7 mmol/L (ref 3.5–5.1)
Sodium: 137 mmol/L (ref 135–145)

## 2023-05-21 MED ORDER — CEPHALEXIN 500 MG PO CAPS: 500.0000 mg | ORAL_CAPSULE | Freq: Three times a day (TID) | ORAL | 0 refills | Status: AC

## 2023-05-21 MED ORDER — CEPHALEXIN 250 MG PO CAPS
500.0000 mg | ORAL_CAPSULE | Freq: Once | ORAL | Status: AC
  Administered 2023-05-21: 500 mg via ORAL
  Filled 2023-05-21: qty 2

## 2023-05-21 NOTE — ED Triage Notes (Addendum)
Pt to ED c/o painful urination, blood in urine, Odorous urine x 3 weeks. Reports hx of UTI. Reports preceiving chemotherapy for anal cancer last treatment 8/15

## 2023-05-21 NOTE — ED Provider Notes (Signed)
Ashley Moore   CSN: [id number] Arrival date & time: 05/21/23  1318     History  Chief Complaint  Patient presents with   Dysuria   Hematuria    Ashley Moore is a 72 y.o. female.  Patient here with painful urination for the last few days maybe weeks.  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  Had chemo and radiation in the past.  Not having any rectal pain or abdominal pain or flank pain.  No nausea vomiting diarrhea.  Denies chest pain or shortness of breath.  The history is provided by the patient.       Home Medications Prior to Admission medications   Medication Sig Start Date End Date Taking? Authorizing Provider  cephALEXin (KEFLEX) 500 MG capsule Take 1 capsule (500 mg total) by mouth 3 (three) times daily for 5 days. 05/21/23 05/26/23 Yes Jemar Paulsen, DO  acetaminophen (TYLENOL) 650 MG CR tablet Take 650 mg by mouth 2 (two) times daily.    [provider]  Ascorbic Acid (VITAMIN C) 500 MG CAPS Take 500 mg by mouth daily.    [provider]  azelastine (ASTELIN) 0.1 % nasal spray Place 2 sprays into both nostrils 2 (two) times daily. Use in each nostril as directed 10/25/21   Panosh, Neta Mends, MD  Calcium Carb-Cholecalciferol 600-10 MG-MCG CAPS Take by mouth.    [provider]  conjugated estrogens (PREMARIN) vaginal cream Place 1 Applicatorful vaginally daily.    [provider]  Cranberry 500 MG TABS Take 1 tablet by mouth at bedtime.    [provider]  levothyroxine (SYNTHROID) 75 MCG tablet TAKE 1 TABLET EVERY DAY (NEED MD APPOINTMENT FOR REFILLS) 04/14/23   Worthy Rancher B, FNP  MYRBETRIQ 50 MG TB24 tablet Take 50 mg by mouth daily. 01/19/21   [provider]  OVER THE COUNTER MEDICATION Place 1 drop into both eyes daily as needed (dry eyes).    [provider]  OVER THE COUNTER MEDICATION Take 1 Scoop by mouth daily. Essential  Orange    [provider]  Prenatal Vit-Fe Fumarate-FA (PRENATAL PO) Take by mouth daily.    [provider]  protein supplement shake (PREMIER PROTEIN) LIQD Take 11 oz by mouth daily. Patient not taking: Reported on 05/04/2023    [provider]  TURMERIC PO Take 750 mg by mouth daily.    [provider]      Allergies    Sulfamethoxazole-trimethoprim, Benadryl [diphenhydramine], Melatonin, and Penicillins    Review of Systems   Review of Systems  Physical Exam Updated Vital Signs BP 130/64 (BP Location: Right Arm)   Pulse 70   Temp 98.2 F (36.8 C) (Oral)   Resp 16   Ht 5\' 2"  (1.575 m)   Wt 74.4 kg   SpO2 100%   BMI 30.00 kg/m  Physical Exam Vitals and nursing Moore reviewed.  Constitutional:      General: She is not in acute distress.    Appearance: She is well-developed. She is not ill-appearing.  HENT:     Head: Normocephalic and atraumatic.     Nose: Nose normal.     Mouth/Throat:     Mouth: Mucous membranes are moist.  Eyes:     Extraocular Movements: Extraocular movements intact.     Conjunctiva/sclera: Conjunctivae normal.     Pupils: Pupils are equal, round, and reactive to light.  Cardiovascular:  Rate and Rhythm: Normal rate and regular rhythm.     Pulses: Normal pulses.     Heart sounds: Normal heart sounds. No murmur heard. Pulmonary:     Effort: Pulmonary effort is normal. No respiratory distress.     Breath sounds: Normal breath sounds.  Abdominal:     Palpations: Abdomen is soft.     Tenderness: There is abdominal tenderness.  Musculoskeletal:        General: No swelling.     Cervical back: Normal range of motion and neck supple.  Skin:    General: Skin is warm and dry.     Capillary Refill: Capillary refill takes less than 2 seconds.  Neurological:     Mental Status: She is alert.  Psychiatric:        Mood and Affect: Mood normal.     ED Results / Procedures / Treatments   Labs (all labs ordered are  listed, but only abnormal results are displayed) Labs Reviewed  URINALYSIS, ROUTINE W REFLEX MICROSCOPIC - Abnormal; Notable for the following components:      Result Value   APPearance HAZY (*)    Hgb urine dipstick LARGE (*)    Protein, ur TRACE (*)    Leukocytes,Ua LARGE (*)    All other components within normal limits  CBC - Abnormal; Notable for the following components:   Hemoglobin 11.7 (*)    All other components within normal limits  URINE CULTURE  BASIC METABOLIC PANEL    EKG None  Radiology CT Renal Stone Study  Result Date: 05/21/2023 CLINICAL DATA:  Abdominal plain. Stones suspected. Anal carcinoma. * Tracking Code: BO * EXAM: CT ABDOMEN AND PELVIS WITHOUT CONTRAST TECHNIQUE: Multidetector CT imaging of the abdomen and pelvis was performed following the standard protocol without IV contrast. RADIATION DOSE REDUCTION: This exam was performed according to the departmental dose-optimization program which includes automated exposure control, adjustment of the mA and/or kV according to patient size and/or use of iterative reconstruction technique. COMPARISON:  02/07/2023 FINDINGS: Lower chest: Lung bases are clear. Hepatobiliary: No focal hepatic lesion. Normal gallbladder. No biliary duct dilatation. Common bile duct is normal. Pancreas: Pancreas is normal. No ductal dilatation. No pancreatic inflammation. Spleen: Normal spleen Adrenals/urinary tract: Adrenal glands normal. Severe chronic hydronephrosis of the RIGHT kidney with cortical thinning. Proximal hydroureter on the RIGHT. Persistent soft tissue density along the RIGHT psoas muscle potentially obstructs the ureter. No change from comparison exam (image 25/2) The LEFT kidney is normal.  No hydroureter.  No ureterolithiasis. The bladder is thick-walled. Stomach/Bowel: Stomach, small bowel, appendix, and cecum are normal. The colon and rectosigmoid colon are normal. Circumferential wall thickening distal rectum presumed related to  radiation therapies similar prior. 54/2) Vascular/Lymphatic: Abdominal aorta is normal caliber. No periportal or retroperitoneal adenopathy. No pelvic adenopathy. Reproductive: Unremarkable Other: No free fluid. Musculoskeletal: No acute osseous abnormality. IMPRESSION: 1. 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 No change from prior. 2. Perirectal thickening presumed related to radiation change unchanged. 3. Normal LEFT kidney. 4. Mild bladder wall thickening similar prior. Electronically Signed   By: Genevive Moore M.D.   On: 05/21/2023 16:34    Procedures Procedures    Medications Ordered in ED Medications  cephALEXin (KEFLEX) capsule 500 mg (has no administration in time range)    ED Course/ Medical Decision Making/ A&P  Medical Decision Making Amount and/or Complexity of Data Reviewed Labs: ordered. Radiology: ordered.  Risk Prescription drug management.   ANQUETTE WICHERT is here with painful urination.  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  Unremarkable vitals.  No fever.  Differential diagnosis likely UTI versus less likely kidney stone or intra-abdominal process such as a bowel obstruction.  Will get CBC, CMP, urinalysis and CT scan abdomen and pelvis.  Does not have any discomfort at this time.  Urinalysis appears consistent with infection.  Will send urine culture to confirm.  Lab work otherwise unremarkable per my review and interpretation.  No significant leukocytosis or anemia or electrolyte abnormality.  CT scan shows no change from prior CTs.  Overall no acute process there.  Will treat for urine infection and have her follow-up with primary care.  She understands return if symptoms worsen.  Discharged in good condition.  This chart was dictated using voice recognition software.  Despite best efforts to proofread,  errors can occur which can change the  documentation meaning.         Final Clinical Impression(s) / ED Diagnoses Final diagnoses:  Acute cystitis without hematuria    Rx / DC Orders ED Discharge Orders          Ordered    cephALEXin (KEFLEX) 500 MG capsule  3 times daily        05/21/23 1639              Virgina Norfolk, DO 05/21/23 1640

## 2023-05-21 NOTE — ED Notes (Signed)
ED Provider at bedside. 

## 2023-05-23 LAB — URINE CULTURE: Culture: 10000 — AB

## 2023-05-24 ENCOUNTER — Telehealth (HOSPITAL_BASED_OUTPATIENT_CLINIC_OR_DEPARTMENT_OTHER): Payer: Self-pay | Admitting: *Deleted

## 2023-05-24 NOTE — Telephone Encounter (Signed)
Post ED Visit - Positive Culture Follow-up  Culture report reviewed by antimicrobial stewardship pharmacist: [facility] [address] Pharm.D. []  Celedonio Miyamoto, Pharm.D., BCPS AQ-ID []  Garvin Fila, Pharm.D., BCPS []  Georgina Pillion, Pharm.D., BCPS []  [place], [address].D., BCPS, AAHIVP []  Estella Husk, Pharm.D., BCPS, AAHIVP []  Lysle Pearl, PharmD, BCPS []  Phillips Climes, PharmD, BCPS []  Agapito Games, PharmD, BCPS []  Verlan Friends, PharmD []  Mervyn Gay, PharmD, BCPS []  Vinnie Level, PharmD  [facility] []  Len Childs, PharmD []  Greer Pickerel, PharmD []  Adalberto Cole, PharmD []  Perlie Gold, Rph []  Lonell Face) Jean Rosenthal, PharmD []  Earl Many, PharmD []  Junita Push, PharmD []  Dorna Leitz, PharmD []  Terrilee Files, PharmD []  Lynann Beaver, PharmD []  Keturah Barre, PharmD []  Loralee Pacas, PharmD []  Bernadene Person, PharmD   Positive urine culture Treated with Cephalexin, organism sensitive to the same and no further patient follow-up is required at this time.  Virl Axe [facility] 05/24/2023, 9:14 AM

## 2023-05-25 ENCOUNTER — Telehealth: Payer: Self-pay

## 2023-05-25 NOTE — Patient Outreach (Signed)
  Care Coordination   Follow Up Visit Note   05/25/2023 Name: ELONI DOOMS MRN: [id number] DOB: [birth year]  Ashley Moore is a 72 y.o. year old female who sees Panosh, Neta Mends, MD for primary care. I spoke with  [name] by phone today.  What matters to the patients health and wellness today?  Recovery from UTI     Goals Addressed             This Visit's Progress    " Getting Stronger"       Patient Goals/Self Care Activities: -Patient/Caregiver will self-administer medications as prescribed as evidenced by self-report/primary caregiver report  -Patient/Caregiver will attend all scheduled provider appointments as evidenced by clinician review of documented attendance to scheduled appointments and patient/caregiver report -Patient/Caregiver will call provider office for new concerns or questions as evidenced by review of documented incoming telephone call notes and patient report      05/25/23 Patient with recent ED visit for UTI over the weekend.  Patient reports she feels much better. Reviewed culture, on appropriate antibiotic.  Discussed signs of infection and when to contact physician.  No concerns.           SDOH assessments and interventions completed:  Yes     Care Coordination Interventions:  Yes, provided   Follow up plan: Follow up call scheduled for October    Encounter Outcome:  Patient Visit Completed   Bary Leriche, RN, MSN [place]  [facility], [facility] Management Community Coordinator Direct Dial: [phone]  Fax: [phone] Website: [email or web address]

## 2023-05-25 NOTE — Patient Instructions (Signed)
Visit Information  Thank you for taking time to visit with me today. Please don't hesitate to contact me if I can be of assistance to you.   Following are the goals we discussed today:   Goals Addressed             This Visit's Progress    " Getting Stronger"       Patient Goals/Self Care Activities: -Patient/Caregiver will self-administer medications as prescribed as evidenced by self-report/primary caregiver report  -Patient/Caregiver will attend all scheduled provider appointments as evidenced by clinician review of documented attendance to scheduled appointments and patient/caregiver report -Patient/Caregiver will call provider office for new concerns or questions as evidenced by review of documented incoming telephone call notes and patient report      05/25/23 Patient with recent ED visit for UTI over the weekend.  Patient reports she feels much better. Reviewed culture, on appropriate antibiotic.  Discussed signs of infection and when to contact physician.  No concerns.           Our next appointment is by telephone on 07/13/23 at 1200 pm  Please call the care guide team at [phone] if you need to cancel or reschedule your appointment.   If you are experiencing a Mental Health or Behavioral Health Crisis or need someone to talk to, please call the Suicide and Crisis Lifeline: 988   Patient verbalizes understanding of instructions and care plan provided today and agrees to view in MyChart. Active MyChart status and patient understanding of how to access instructions and care plan via MyChart confirmed with patient.     The patient has been provided with contact information for the care management team and has been advised to call with any health related questions or concerns.   Bary Leriche, RN, MSN [facility], [facility] Management Community Coordinator Direct Dial: [phone]  Fax: [phone] Website:  [email or web address]

## 2023-05-31 DIAGNOSIS — M1712 Unilateral primary osteoarthritis, left knee: Secondary | ICD-10-CM | POA: Diagnosis not present

## 2023-06-01 ENCOUNTER — Inpatient Hospital Stay: Payer: Medicare HMO | Admitting: Oncology

## 2023-06-01 ENCOUNTER — Inpatient Hospital Stay: Payer: Medicare HMO | Attending: Nurse Practitioner

## 2023-06-01 ENCOUNTER — Inpatient Hospital Stay: Payer: Medicare HMO

## 2023-06-01 ENCOUNTER — Encounter: Payer: Self-pay | Admitting: *Deleted

## 2023-06-01 ENCOUNTER — Other Ambulatory Visit (HOSPITAL_BASED_OUTPATIENT_CLINIC_OR_DEPARTMENT_OTHER): Payer: Self-pay

## 2023-06-01 VITALS — BP 125/63 | HR 64 | Resp 18

## 2023-06-01 DIAGNOSIS — Z5189 Encounter for other specified aftercare: Secondary | ICD-10-CM | POA: Insufficient documentation

## 2023-06-01 DIAGNOSIS — Z23 Encounter for immunization: Secondary | ICD-10-CM | POA: Diagnosis not present

## 2023-06-01 DIAGNOSIS — C210 Malignant neoplasm of anus, unspecified: Secondary | ICD-10-CM | POA: Insufficient documentation

## 2023-06-01 DIAGNOSIS — Z79620 Long term (current) use of immunosuppressive biologic: Secondary | ICD-10-CM | POA: Insufficient documentation

## 2023-06-01 DIAGNOSIS — Z5112 Encounter for antineoplastic immunotherapy: Secondary | ICD-10-CM | POA: Insufficient documentation

## 2023-06-01 LAB — CBC WITH DIFFERENTIAL (CANCER CENTER ONLY)
Abs Immature Granulocytes: 0.01 10*3/uL (ref 0.00–0.07)
Basophils Absolute: 0 10*3/uL (ref 0.0–0.1)
Basophils Relative: 1 %
Eosinophils Absolute: 0.3 10*3/uL (ref 0.0–0.5)
Eosinophils Relative: 5 %
HCT: 36.1 % (ref 36.0–46.0)
Hemoglobin: 11.7 g/dL — ABNORMAL LOW (ref 12.0–15.0)
Immature Granulocytes: 0 %
Lymphocytes Relative: 13 %
Lymphs Abs: 0.8 10*3/uL (ref 0.7–4.0)
MCH: 29.2 pg (ref 26.0–34.0)
MCHC: 32.4 g/dL (ref 30.0–36.0)
MCV: 90 fL (ref 80.0–100.0)
Monocytes Absolute: 0.4 10*3/uL (ref 0.1–1.0)
Monocytes Relative: 7 %
Neutro Abs: 4.3 10*3/uL (ref 1.7–7.7)
Neutrophils Relative %: 74 %
Platelet Count: 227 10*3/uL (ref 150–400)
RBC: 4.01 MIL/uL (ref 3.87–5.11)
RDW: 14.6 % (ref 11.5–15.5)
WBC Count: 5.8 10*3/uL (ref 4.0–10.5)
nRBC: 0 % (ref 0.0–0.2)

## 2023-06-01 LAB — CMP (CANCER CENTER ONLY)
ALT: 25 U/L (ref 0–44)
AST: 40 U/L (ref 15–41)
Albumin: 3.8 g/dL (ref 3.5–5.0)
Alkaline Phosphatase: 83 U/L (ref 38–126)
Anion gap: 10 (ref 5–15)
BUN: 21 mg/dL (ref 8–23)
CO2: 24 mmol/L (ref 22–32)
Calcium: 9.2 mg/dL (ref 8.9–10.3)
Chloride: 102 mmol/L (ref 98–111)
Creatinine: 0.89 mg/dL (ref 0.44–1.00)
GFR, Estimated: >60 mL/min (ref >60)
Glucose, Bld: 82 mg/dL (ref 70–99)
Potassium: 4.4 mmol/L (ref 3.5–5.1)
Sodium: 136 mmol/L (ref 135–145)
Total Bilirubin: 0.3 mg/dL (ref 0.3–1.2)
Total Protein: 7.3 g/dL (ref 6.5–8.1)

## 2023-06-01 LAB — TSH: TSH: 3.946 u[IU]/mL (ref 0.350–4.500)

## 2023-06-01 MED ORDER — SODIUM CHLORIDE 0.9 % IV SOLN: Freq: Once | INTRAVENOUS | Status: AC

## 2023-06-01 MED ORDER — INFLUENZA VAC A&B SURF ANT ADJ 0.5 ML IM SUSY
0.5000 mL | PREFILLED_SYRINGE | Freq: Once | INTRAMUSCULAR | Status: AC
  Administered 2023-06-01: 0.5 mL via INTRAMUSCULAR
  Filled 2023-06-01: qty 0.5

## 2023-06-01 MED ORDER — SODIUM CHLORIDE 0.9 % IV SOLN
480.0000 mg | Freq: Once | INTRAVENOUS | Status: AC
  Administered 2023-06-01: 480 mg via INTRAVENOUS
  Filled 2023-06-01: qty 48

## 2023-06-01 NOTE — Patient Instructions (Addendum)
[facility]   Discharge Instructions: Thank you for choosing [facility] to provide your oncology and hematology care.   If you have a lab appointment with the Cancer Center, please go directly to the Cancer Center and check in at the registration area.   Wear comfortable clothing and clothing appropriate for easy access to any Portacath or PICC line.   We strive to give you quality time with your provider. You may need to reschedule your appointment if you arrive late (15 or more minutes).  Arriving late affects you and other patients whose appointments are after yours.  Also, if you miss three or more appointments without notifying the office, you may be dismissed from the clinic at the provider's discretion.      For prescription refill requests, have your pharmacy contact our office and allow 72 hours for refills to be completed.    Today you received the following chemotherapy and/or immunotherapy agents Nivolumab (OPDIVO).      To help prevent nausea and vomiting after your treatment, we encourage you to take your nausea medication as directed.  BELOW ARE SYMPTOMS THAT SHOULD BE REPORTED IMMEDIATELY: *FEVER GREATER THAN 100.4 F (38 C) OR HIGHER *CHILLS OR SWEATING *NAUSEA AND VOMITING THAT IS NOT CONTROLLED WITH YOUR NAUSEA MEDICATION *UNUSUAL SHORTNESS OF BREATH *UNUSUAL BRUISING OR BLEEDING *URINARY PROBLEMS (pain or burning when urinating, or frequent urination) *BOWEL PROBLEMS (unusual diarrhea, constipation, pain near the anus) TENDERNESS IN MOUTH AND THROAT WITH OR WITHOUT PRESENCE OF ULCERS (sore throat, sores in mouth, or a toothache) UNUSUAL RASH, SWELLING OR PAIN  UNUSUAL VAGINAL DISCHARGE OR ITCHING   Items with * indicate a potential emergency and should be followed up as soon as possible or go to the Emergency Department if any problems should occur.  Please show the CHEMOTHERAPY ALERT CARD or IMMUNOTHERAPY ALERT CARD  at check-in to the Emergency Department and triage nurse.  Should you have questions after your visit or need to cancel or reschedule your appointment, please contact [facility]  Dept: [phone]  and follow the prompts.  Office hours are 8:00 a.m. to 4:30 p.m. Monday - Friday. Please note that voicemails left after 4:00 p.m. may not be returned until the following business day.  We are closed weekends and major holidays. You have access to a nurse at all times for urgent questions. Please call the main number to the clinic Dept: [phone] and follow the prompts.   For any non-urgent questions, you may also contact your provider using MyChart. We now offer e-Visits for anyone 98 and older to request care online for non-urgent symptoms. For details visit [email or web address].   Also download the MyChart app! Go to the app store, search "MyChart", open the app, select [place], and log in with your MyChart username and password.  Nivolumab Injection What is this medication? NIVOLUMAB (nye VOL ue mab) treats some types of cancer. It works by helping your immune system slow or stop the spread of cancer cells. It is a monoclonal antibody. This medicine may be used for other purposes; ask your health care provider or pharmacist if you have questions. COMMON BRAND NAME(S): Opdivo What should I tell my care team before I take this medication? They need to know if you have any of these conditions: Allogeneic stem cell transplant (uses someone else's stem cells) Autoimmune diseases, such as Crohn disease, ulcerative colitis, lupus History of chest radiation Nervous system  problems, such as Guillain-Barre syndrome or myasthenia gravis Organ transplant An unusual or allergic reaction to nivolumab, other medications, foods, dyes, or preservatives Pregnant or trying to get pregnant Breast-feeding How should I use this medication? This medication is infused  into a vein. It is given in a hospital or clinic setting. A special MedGuide will be given to you before each treatment. Be sure to read this information carefully each time. Talk to your care team about the use of this medication in children. While it may be prescribed for children as young as 12 years for selected conditions, precautions do apply. Overdosage: If you think you have taken too much of this medicine contact a poison control center or emergency room at once. NOTE: This medicine is only for you. Do not share this medicine with others. What if I miss a dose? Keep appointments for follow-up doses. It is important not to miss your dose. Call your care team if you are unable to keep an appointment. What may interact with this medication? Interactions have not been studied. This list may not describe all possible interactions. Give your health care provider a list of all the medicines, herbs, non-prescription drugs, or dietary supplements you use. Also tell them if you smoke, drink alcohol, or use illegal drugs. Some items may interact with your medicine. What should I watch for while using this medication? Your condition will be monitored carefully while you are receiving this medication. You may need blood work while taking this medication. This medication may cause serious skin reactions. They can happen weeks to months after starting the medication. Contact your care team right away if you notice fevers or flu-like symptoms with a rash. The rash may be red or purple and then turn into blisters or peeling of the skin. You may also notice a red rash with swelling of the face, lips, or lymph nodes in your neck or under your arms. Tell your care team right away if you have any change in your eyesight. Talk to your care team if you are pregnant or think you might be pregnant. A negative pregnancy test is required before starting this medication. A reliable form of contraception is recommended  while taking this medication and for 5 months after the last dose. Talk to your care team about effective forms of contraception. Do not breast-feed while taking this medication and for 5 months after the last dose. What side effects may I notice from receiving this medication? Side effects that you should report to your care team as soon as possible: Allergic reactions--skin rash, itching, hives, swelling of the face, lips, tongue, or throat Dry cough, shortness of breath or trouble breathing Eye pain, redness, irritation, or discharge with blurry or decreased vision Heart muscle inflammation--unusual weakness or fatigue, shortness of breath, chest pain, fast or irregular heartbeat, dizziness, swelling of the ankles, feet, or hands Hormone gland problems--headache, sensitivity to light, unusual weakness or fatigue, dizziness, fast or irregular heartbeat, increased sensitivity to cold or heat, excessive sweating, constipation, hair loss, increased thirst or amount of urine, tremors or shaking, irritability Infusion reactions--chest pain, shortness of breath or trouble breathing, feeling faint or lightheaded Kidney injury (glomerulonephritis)--decrease in the amount of urine, red or dark brown urine, foamy or bubbly urine, swelling of the ankles, hands, or feet Liver injury--right upper belly pain, loss of appetite, nausea, light-colored stool, dark yellow or brown urine, yellowing skin or eyes, unusual weakness or fatigue Pain, tingling, or numbness in the hands or feet,  muscle weakness, change in vision, confusion or trouble speaking, loss of balance or coordination, trouble walking, seizures Rash, fever, and swollen lymph nodes Redness, blistering, peeling, or loosening of the skin, including inside the mouth Sudden or severe stomach pain, bloody diarrhea, fever, nausea, vomiting Side effects that usually do not require medical attention (report these to your care team if they continue or are  bothersome): Bone, joint, or muscle pain Diarrhea Fatigue Loss of appetite Nausea Skin rash This list may not describe all possible side effects. Call your doctor for medical advice about side effects. You may report side effects to FDA at 1-800-FDA-1088. Where should I keep my medication? This medication is given in a hospital or clinic. It will not be stored at home. NOTE: This sheet is a summary. It may not cover all possible information. If you have questions about this medicine, talk to your doctor, pharmacist, or health care provider.  2024 Elsevier/Gold Standard (2022-01-03 00:00:00)  Influenza Vaccine Injection What is this medication? INFLUENZA VACCINE (in floo EN zuh vak SEEN) reduces the risk of the influenza (flu). It does not treat influenza. It is still possible to get influenza after receiving this vaccine, but the symptoms may be less severe or not last as long. It works by helping your immune system learn how to fight off a future infection. This medicine may be used for other purposes; ask your health care provider or pharmacist if you have questions. COMMON BRAND NAME(S): Afluria Quadrivalent, FLUAD Quadrivalent, Fluarix Quadrivalent, Flublok Quadrivalent, FLUCELVAX Quadrivalent, Flulaval Quadrivalent, Fluzone Quadrivalent What should I tell my care team before I take this medication? They need to know if you have any of these conditions: Bleeding disorder like hemophilia Fever or infection Guillain-Barre syndrome or other neurological problems Immune system problems Infection with the human immunodeficiency virus (HIV) or AIDS Low blood platelet counts Multiple sclerosis An unusual or allergic reaction to influenza virus vaccine, latex, other medications, foods, dyes, or preservatives. Different brands of vaccines contain different allergens. Some may contain latex or eggs. Talk to your care team about your allergies to make sure that you get the right  vaccine. Pregnant or trying to get pregnant Breastfeeding How should I use this medication? This vaccine is injected into a muscle or under the skin. It is given by your care team. A copy of Vaccine Information Statements will be given before each vaccination. Be sure to read this sheet carefully each time. This sheet may change often. Talk to your care team to see which vaccines are right for you. Some vaccines should not be used in all age groups. Overdosage: If you think you have taken too much of this medicine contact a poison control center or emergency room at once. NOTE: This medicine is only for you. Do not share this medicine with others. What if I miss a dose? This does not apply. What may interact with this medication? Certain medications that lower your immune system, such as etanercept, anakinra, infliximab, adalimumab Certain medications that prevent or treat blood clots, such as warfarin Chemotherapy or radiation therapy Phenytoin Steroid medications, such as prednisone or cortisone Theophylline Vaccines This list may not describe all possible interactions. Give your health care provider a list of all the medicines, herbs, non-prescription drugs, or dietary supplements you use. Also tell them if you smoke, drink alcohol, or use illegal drugs. Some items may interact with your medicine. What should I watch for while using this medication? Report any side effects that do not go  away with your care team. Call your care team if any unusual symptoms occur within 6 weeks of receiving this vaccine. You may still catch the flu, but the illness is not usually as bad. You cannot get the flu from the vaccine. The vaccine will not protect against colds or other illnesses that may cause fever. The vaccine is needed every year. What side effects may I notice from receiving this medication? Side effects that you should report to your care team as soon as possible: Allergic reactions--skin  rash, itching, hives, swelling of the face, lips, tongue, or throat Side effects that usually do not require medical attention (report these to your care team if they continue or are bothersome): Chills Fatigue Headache Joint pain Loss of appetite Muscle pain Nausea Pain, redness, or irritation at injection site This list may not describe all possible side effects. Call your doctor for medical advice about side effects. You may report side effects to FDA at 1-800-FDA-1088. Where should I keep my medication? The vaccine is only given by your care team. It will not be stored at home. NOTE: This sheet is a summary. It may not cover all possible information. If you have questions about this medicine, talk to your doctor, pharmacist, or health care provider.  2024 Elsevier/Gold Standard (2022-02-15 00:00:00)

## 2023-06-01 NOTE — Progress Notes (Signed)
[facility] OFFICE PROGRESS NOTE   Diagnosis: Anal cancer  INTERVAL HISTORY:   Ashley Moore returns as scheduled.  She completed another cycle of nivolumab on 05/04/2023.  No rash.  She reports arthralgias in the hands, right shoulder, and knees.  She requests a rheumatology referral. She was seen in the emergency room 05/21/2023 with gross hematuria.  She reports passing clots.  She was diagnosed with a urinary tract infection and treated with cephalexin.  The hematuria is much improved.  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  No adenopathy.  No change in soft tissue density at the right psoas. Objective:  Vital signs in last 24 hours:  Blood pressure 105/69, pulse 69, temperature 98.2 F (36.8 C), resp. rate 18, height 5\' 2"  (1.575 m), weight 165 lb 3.2 oz (74.9 kg), SpO2 100%.    Lymphatics: No cervical, supraclavicular, axillary, or inguinal nodes Resp: Lungs with bilateral end inspiratory rales, no respiratory distress Cardio: Regular rate and rhythm GI: No hepatosplenomegaly, no mass Vascular: Trace edema to lower leg bilaterally, support stockings in place Musculoskeletal: Joint deformities at the distal left greater than right hand joints   Lab Results:  Lab Results  Component Value Date   WBC 5.8 06/01/2023   HGB 11.7 (L) 06/01/2023   HCT 36.1 06/01/2023   MCV 90.0 06/01/2023   PLT 227 06/01/2023   NEUTROABS 4.3 06/01/2023    CMP  Lab Results  Component Value Date   NA 136 06/01/2023   K 4.4 06/01/2023   CL 102 06/01/2023   CO2 24 06/01/2023   GLUCOSE 82 06/01/2023   BUN 21 06/01/2023   CREATININE 0.89 06/01/2023   CALCIUM 9.2 06/01/2023   PROT 7.3 06/01/2023   ALBUMIN 3.8 06/01/2023   AST 40 06/01/2023   ALT 25 06/01/2023   ALKPHOS 83 06/01/2023   BILITOT 0.3 06/01/2023   GFRNONAA >60 06/01/2023   GFRAA >60 06/17/2020    Lab Results  Component Value Date   CEA1 1.15  03/04/2021   CEA 1.16 03/04/2021      Medications: I have reviewed the patient's current medications.   Assessment/Plan: Anal cancer CT abdomen/pelvis 10/21/2016-thickening of the anus extending to the junction between the anus and rectum with a large stool ball in the rectum and mild fat stranding posterior to the rectum. Enlarged lymph nodes in the right and left inguinal regions. A few mildly prominent nodes seen posterior to the rectum. 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 PET scan 11/10/2016-markedly hypermetabolic anal mass with hypermetabolic metastases to the groin region bilaterally, left pelvic sidewall and presacral space. Initiation of radiation and cycle 1 5-FU/mitomycin C 11/14/2016 Cycle 2 5-FU/mitomycin C 12/12/2016 (5-FU dose reduced due to mucositis, diarrhea, skin breakdown) Radiation completed 12/23/2016 CT abdomen/pelvis 04/14/2017-resolution of anal mass and bilateral inguinal lymphadenopathy. No residual tumor seen. CT abdomen/pelvis 01/28/2020-right hydroureteronephrosis, transition at the level of the mid right ureter, retroperitoneal lymphadenopathy CT renal stone study 01/28/2020-severe right hydronephrosis and proximal right hydroureter, 8 mm area of increased attenuation in the proximal right ureter-stone versus soft tissue mass PET 02/13/2020-hypermetabolic right retroperitoneal nodal metastases in the aortocaval and posterior pericaval chains, nonspecific mild anal wall hypermetabolism, right nephroureteral stent 02/21/2020 anorectal exam per Dr. Theadora Rama radiation changes of the skin around the anal margin.  Posterior midline smooth scarring.  Mildly stenotic.  Stenosis seems better.  No palpable concerning lesions.  Entire anal canal and distal rectum feels smooth and healthy.  Partial anoscopy performed with no lesions in the anal canal. Xeloda/radiation beginning 03/02/2020, completed 04/13/2020 CT abdomen/pelvis  06/17/2020-resolution of right retroperitoneal lymphadenopathy, no remaining pathologically enlarged lymph nodes, residual mild right hydronephrosis, no evidence of progressive disease, stable anal wall thickening Digital rectal exam by Dr. Roxan Hockey 08/26/2020-posterior midline smooth scarring.  Mildly stenotic.  Stenosis seems better.  No palpable concerning lesions.  Anal canal feels smooth without palpable concern.  Unable to tolerate anoscopy.  Next visit in 6 months for surveillance. CT abdomen/pelvis 10/28/2020-no evidence of local recurrence or metastatic disease.  Stable mild rectal wall thickening.  New diffuse bladder wall thickening possibly related to prior radiation CT abdomen/pelvis 01/16/2021- recurrent right-sided hydronephrosis and proximal right hydroureter status post removal of right ureter stent, new soft tissue nodule along the course of the right ureter between the right psoas and IVC suspicious for recurrent lymphadenopathy PET 02/12/2021-small focus of hypermetabolism at the anorectal junction without a definable mass, right retroperitoneal nodal metastasis with obstruction of the proximal right ureter, right middle lobe hypermetabolic nodule, hypermetabolism in the right hilum without a defined nodal mass, hypermetabolic left supraclavicular and left axillary nodes 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 Cycle 1 Taxol/carboplatin 03/11/2021 Cycle 2 Taxol/carboplatin 03/31/2021, Fulphila Cycle 3 Taxol/carboplatin 04/21/2021, Fulphila CTs 05/11/2021-decrease size of FDG avid nodule in right middle lobe, right middle lobe subpleural nodule slightly enlarged, decreased in size of left axillary and subpectoral nodes, unchanged FDG avid right iliac nodes Cycle 4 Taxol/carboplatin 05/12/2021, G-CSF discontinued secondary to poor tolerance, Taxol and carboplatin dose reduced Cycle 5 Taxol/carboplatin 06/02/2021 Cycle 6 carboplatin  07/01/2021, Taxol held due to neuropathy Cycle 7 carboplatin 07/29/2021, Taxol held due to neuropathy CTs 08/16/2021-new liver lesion, increase in right psoas lymph node, unchanged right hydronephrosis, new mediastinal and right hilar lymphadenopathy, enlargement of bilateral pulmonary nodules and at least one new nodule, new subcarinal node Cycle 1 nivolumab 08/25/2021 Cycle 2 nivolumab 09/08/2021 Cycle 3 nivolumab 09/22/2021 Cycle 4 nivolumab 10/06/2021 Cycle 5 nivolumab 10/20/2021 Cycle 6 nivolumab 11/04/2021 CTs 11/12/2021-resolution of lung nodules, decreased size of right psoas node, no remaining mediastinal lymphadenopathy, resolution of right liver lesion Cycle 7 nivolumab 11/17/2021 Cycle 8 nivolumab 12/01/2021 Cycle 9 nivolumab 12/15/2021 Cycle 10 nivolumab 12/30/2021 Cycle 11 nivolumab 01/12/2022 Cycle 12 nivolumab 5/10/277-monthly dosing Cycle 13 nivolumab 02/23/2022 CT 03/16/2022-stable right psoas mass, no pulmonary nodules, persistent circumferential anal thickening, unchanged subcentimeter left liver lesion Cycle 14 nivolumab 03/24/2022 Cycle 15 nivolumab 04/21/2022 Cycle 16 nivolumab 05/19/2022 Cycle 17 nivolumab 06/16/2022 Cycle 18 nivolumab 07/14/2022 CTs 08/16/2022-stable ill-defined soft tissue in the right retroperitoneum, very mild intra and EXTR hepatic biliary duct dilation, common bile duct dilated to 11 mm in the porta hepatis Cycle 19 nivolumab 08/18/2022 Cycle 20 nivolumab 09/15/2022 Cycle 21 nivolumab 10/18/2022 Cycle 22 nivolumab 11/15/2022 Cycle 23 nivolumab 12/14/2022 Cycle 24 nivolumab 01/12/2023 CTs 02/07/2023-slight decrease in size of the right psoas mass, no other evidence of metastatic disease Cycle 25 nivolumab 02/09/2023 Cycle 26 nivolumab 03/09/2023 Cycle 27 nivolumab 04/06/2023 Cycle 28 nivolumab 05/04/2023 CT renal stone study 05/21/2023-unchanged soft tissue at the right psoas, severe chronic right hydronephrosis, no adenopathy Left labial lesions. Question direct  extension from the anal cancer versus metastatic disease from anal cancer versus a separate malignant process. 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 Multiple family members with breast cancer. History of hypokalemia-likely secondary to decreased nutritional intake and diarrhea; potassium in normal  range 01/16/2017. No longer taking a potassium supplement. Right hydroureteronephrosis on CT 01/28/2020, status post a cystoscopy/pyelogram 01/28/2020 confirming extrinsic compression of the right ureter, status post stent placement Right ureter stent exchange 06/12/2020 Right ureter stent removed 01/13/2021   8.  Admission 01/07/2021 with Klebsiella pneumoniae urosepsis/bacteremia  9.  Hospital admission 05/15/2021 with UTI/possible right pyelonephritis 10.  05/21/2023-emergency room visit with gross hematuria and dysuria, Klebsiella UTI-cephalexin     Disposition: Ms. Draudt returns as scheduled.  She appears unchanged.  She is in clinical remission from anal cancer.  She continues to tolerate the nivolumab well.  She is scheduled to complete 3 more treatments with nivolumab.  I suspect the joint findings and symptoms are related to osteoarthritis.  She requests a rheumatology referral.  She has recurrent urinary tract infections.  She will call for progressive hematuria.  She does not wish to follow-up with urology at present, but I would encourage a urology follow-up if she has another UTI.  Thornton Papas, MD  06/01/2023  11:50 AM

## 2023-06-01 NOTE — Progress Notes (Signed)
Patient seen by Dr. Thornton Papas today  Vitals are within treatment parameters.  Labs reviewed by Dr. Truett Perna and are within treatment parameters.  Per physician team, patient is ready for treatment and there are NO modifications to the treatment plan. Requesting flu vaccine today

## 2023-06-01 NOTE — Addendum Note (Signed)
Addended by: Lorelle Formosa on: 06/01/2023 01:40 PM   Modules accepted: Orders

## 2023-06-02 LAB — T4: T4, Total: 9.2 ug/dL (ref 4.5–12.0)

## 2023-06-06 ENCOUNTER — Telehealth: Payer: Self-pay

## 2023-06-06 NOTE — Telephone Encounter (Signed)
Transition Care Management Unsuccessful Follow-up Telephone Call  Date of discharge and from where:  [facility] 9/1  Attempts:  1st Attempt  Reason for unsuccessful TCM follow-up call:  No answer/busy   [facility]  [facility], [facility] Guide, Phone: [phone] Website: [email or web address]

## 2023-06-07 ENCOUNTER — Telehealth: Payer: Self-pay

## 2023-06-07 DIAGNOSIS — M1712 Unilateral primary osteoarthritis, left knee: Secondary | ICD-10-CM | POA: Diagnosis not present

## 2023-06-07 NOTE — Telephone Encounter (Signed)
Transition Care Management Unsuccessful Follow-up Telephone Call  Date of discharge and from where:  [facility]  Attempts:  2nd Attempt  Reason for unsuccessful TCM follow-up call:  No answer/busy   [facility]  [facility], [facility] Guide, Phone: [phone] Website: [email or web address]

## 2023-06-08 ENCOUNTER — Telehealth: Payer: Self-pay | Admitting: *Deleted

## 2023-06-08 NOTE — Telephone Encounter (Signed)
Faxed referral order, demographics and medical records to Dr. Alben Deeds. Patient is aware.

## 2023-06-14 DIAGNOSIS — M1712 Unilateral primary osteoarthritis, left knee: Secondary | ICD-10-CM | POA: Diagnosis not present

## 2023-06-23 ENCOUNTER — Ambulatory Visit: Payer: Medicare HMO

## 2023-06-26 ENCOUNTER — Ambulatory Visit: Payer: Medicare HMO | Admitting: Family Medicine

## 2023-06-26 VITALS — BP 108/62 | HR 72 | Temp 98.1°F | Wt 166.2 lb

## 2023-06-26 DIAGNOSIS — M6740 Ganglion, unspecified site: Secondary | ICD-10-CM | POA: Diagnosis not present

## 2023-06-26 NOTE — Progress Notes (Signed)
Subjective:    Patient ID: Ashley Moore, female    DOB: May 22, 1951, 72 y.o.   MRN: [id number]  HPI Here for painful knots on several fingers that came up about a month ago. She has had OA for years.    Review of Systems  Constitutional: Negative.   Respiratory: Negative.    Cardiovascular: Negative.   Musculoskeletal:  Positive for arthralgias.       Objective:   Physical Exam Constitutional:      Comments: Walks with a cane   Cardiovascular:     Rate and Rhythm: Normal rate and regular rhythm.     Pulses: Normal pulses.     Heart sounds: Normal heart sounds.  Pulmonary:     Effort: Pulmonary effort is normal.     Breath sounds: Normal breath sounds.  Musculoskeletal:     Comments: There are several ganglion cysts on fingers of the left hand. The largest of these is on the left fifth PIP   Neurological:     Mental Status: She is alert.           Assessment & Plan:  Ganglion cysts. We will refer her to Hand Surgery to address these.  Gershon Crane, MD

## 2023-06-27 ENCOUNTER — Ambulatory Visit: Payer: Medicare HMO | Admitting: Family Medicine

## 2023-06-29 ENCOUNTER — Encounter: Payer: Self-pay | Admitting: Nurse Practitioner

## 2023-06-29 ENCOUNTER — Other Ambulatory Visit: Payer: Self-pay

## 2023-06-29 ENCOUNTER — Inpatient Hospital Stay: Payer: Medicare HMO | Admitting: Nurse Practitioner

## 2023-06-29 ENCOUNTER — Inpatient Hospital Stay: Payer: Medicare HMO | Attending: Nurse Practitioner

## 2023-06-29 ENCOUNTER — Inpatient Hospital Stay: Payer: Medicare HMO

## 2023-06-29 VITALS — BP 113/59 | HR 70

## 2023-06-29 DIAGNOSIS — Z79620 Long term (current) use of immunosuppressive biologic: Secondary | ICD-10-CM | POA: Diagnosis not present

## 2023-06-29 DIAGNOSIS — C210 Malignant neoplasm of anus, unspecified: Secondary | ICD-10-CM

## 2023-06-29 DIAGNOSIS — Z5112 Encounter for antineoplastic immunotherapy: Secondary | ICD-10-CM | POA: Diagnosis not present

## 2023-06-29 LAB — CMP (CANCER CENTER ONLY)
ALT: 31 U/L (ref 0–44)
AST: 45 U/L — ABNORMAL HIGH (ref 15–41)
Albumin: 4.2 g/dL (ref 3.5–5.0)
Alkaline Phosphatase: 83 U/L (ref 38–126)
Anion gap: 7 (ref 5–15)
BUN: 18 mg/dL (ref 8–23)
CO2: 25 mmol/L (ref 22–32)
Calcium: 9.8 mg/dL (ref 8.9–10.3)
Chloride: 102 mmol/L (ref 98–111)
Creatinine: 0.83 mg/dL (ref 0.44–1.00)
GFR, Estimated: >60 mL/min (ref >60)
Glucose, Bld: 79 mg/dL (ref 70–99)
Potassium: 4.5 mmol/L (ref 3.5–5.1)
Sodium: 134 mmol/L — ABNORMAL LOW (ref 135–145)
Total Bilirubin: 0.4 mg/dL (ref 0.3–1.2)
Total Protein: 7.5 g/dL (ref 6.5–8.1)

## 2023-06-29 LAB — CBC WITH DIFFERENTIAL (CANCER CENTER ONLY)
Abs Immature Granulocytes: 0.01 10*3/uL (ref 0.00–0.07)
Basophils Absolute: 0 10*3/uL (ref 0.0–0.1)
Basophils Relative: 1 %
Eosinophils Absolute: 0.3 10*3/uL (ref 0.0–0.5)
Eosinophils Relative: 6 %
HCT: 37.3 % (ref 36.0–46.0)
Hemoglobin: 12 g/dL (ref 12.0–15.0)
Immature Granulocytes: 0 %
Lymphocytes Relative: 19 %
Lymphs Abs: 0.9 10*3/uL (ref 0.7–4.0)
MCH: 28.8 pg (ref 26.0–34.0)
MCHC: 32.2 g/dL (ref 30.0–36.0)
MCV: 89.7 fL (ref 80.0–100.0)
Monocytes Absolute: 0.4 10*3/uL (ref 0.1–1.0)
Monocytes Relative: 8 %
Neutro Abs: 3.3 10*3/uL (ref 1.7–7.7)
Neutrophils Relative %: 66 %
Platelet Count: 219 10*3/uL (ref 150–400)
RBC: 4.16 MIL/uL (ref 3.87–5.11)
RDW: 14.8 % (ref 11.5–15.5)
WBC Count: 4.9 10*3/uL (ref 4.0–10.5)
nRBC: 0 % (ref 0.0–0.2)

## 2023-06-29 LAB — TSH: TSH: 2.997 u[IU]/mL (ref 0.350–4.500)

## 2023-06-29 MED ORDER — SODIUM CHLORIDE 0.9 % IV SOLN: Freq: Once | INTRAVENOUS | Status: AC

## 2023-06-29 MED ORDER — SODIUM CHLORIDE 0.9 % IV SOLN
480.0000 mg | Freq: Once | INTRAVENOUS | Status: AC
  Administered 2023-06-29: 480 mg via INTRAVENOUS
  Filled 2023-06-29: qty 48

## 2023-06-29 NOTE — Progress Notes (Signed)
Patient seen by Lonna Cobb NP today  Vitals are within treatment parameters:Yes   Labs are within treatment parameters: Yes   Treatment plan has been signed: Yes   Per physician team, Patient is ready for treatment and there are NO modifications to the treatment plan.

## 2023-06-29 NOTE — Progress Notes (Signed)
[facility] OFFICE PROGRESS NOTE   Diagnosis: Anal cancer  INTERVAL HISTORY:   Ashley Moore returns as scheduled.  She completed another cycle of nivolumab 06/01/2023.  No rash or diarrhea.  No nausea or vomiting.  She has a good appetite.  She intermittently has pain "all over".  This occurs infrequently.  Objective:  Vital signs in last 24 hours:  Blood pressure 109/70, pulse 79, temperature 98.1 F (36.7 C), temperature source Temporal, resp. rate 18, height 5\' 2"  (1.575 m), weight 166 lb 4.8 oz (75.4 kg), SpO2 98%.    HEENT: No thrush or ulcers. Lymphatics: No palpable cervical, supraclavicular, axillary or inguinal lymph nodes. Resp: Lungs clear bilaterally. Cardio: Regular rate and rhythm. GI: Abdomen soft and nontender.  No hepatosplenomegaly. Vascular: Trace edema lower leg bilaterally. Skin: No rash.   Lab Results:  Lab Results  Component Value Date   WBC 4.9 06/29/2023   HGB 12.0 06/29/2023   HCT 37.3 06/29/2023   MCV 89.7 06/29/2023   PLT 219 06/29/2023   NEUTROABS 3.3 06/29/2023    Imaging:  No results found.  Medications: I have reviewed the patient's current medications.  Assessment/Plan: Anal cancer CT abdomen/pelvis 10/21/2016-thickening of the anus extending to the junction between the anus and rectum with a large stool ball in the rectum and mild fat stranding posterior to the rectum. Enlarged lymph nodes in the right and left inguinal regions. A few mildly prominent nodes seen posterior to the rectum. 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 PET scan 11/10/2016-markedly hypermetabolic anal mass with hypermetabolic metastases to the groin region bilaterally, left pelvic sidewall and presacral space. Initiation of radiation and cycle 1 5-FU/mitomycin C 11/14/2016 Cycle 2 5-FU/mitomycin C 12/12/2016 (5-FU dose reduced due to mucositis, diarrhea, skin breakdown) Radiation completed 12/23/2016 CT abdomen/pelvis  04/14/2017-resolution of anal mass and bilateral inguinal lymphadenopathy. No residual tumor seen. CT abdomen/pelvis 01/28/2020-right hydroureteronephrosis, transition at the level of the mid right ureter, retroperitoneal lymphadenopathy CT renal stone study 01/28/2020-severe right hydronephrosis and proximal right hydroureter, 8 mm area of increased attenuation in the proximal right ureter-stone versus soft tissue mass PET 02/13/2020-hypermetabolic right retroperitoneal nodal metastases in the aortocaval and posterior pericaval chains, nonspecific mild anal wall hypermetabolism, right nephroureteral stent 02/21/2020 anorectal exam per Dr. Theadora Rama radiation changes of the skin around the anal margin.  Posterior midline smooth scarring.  Mildly stenotic.  Stenosis seems better.  No palpable concerning lesions.  Entire anal canal and distal rectum feels smooth and healthy.  Partial anoscopy performed with no lesions in the anal canal. Xeloda/radiation beginning 03/02/2020, completed 04/13/2020 CT abdomen/pelvis 06/17/2020-resolution of right retroperitoneal lymphadenopathy, no remaining pathologically enlarged lymph nodes, residual mild right hydronephrosis, no evidence of progressive disease, stable anal wall thickening Digital rectal exam by Dr. Roxan Hockey 08/26/2020-posterior midline smooth scarring.  Mildly stenotic.  Stenosis seems better.  No palpable concerning lesions.  Anal canal feels smooth without palpable concern.  Unable to tolerate anoscopy.  Next visit in 6 months for surveillance. CT abdomen/pelvis 10/28/2020-no evidence of local recurrence or metastatic disease.  Stable mild rectal wall thickening.  New diffuse bladder wall thickening possibly related to prior radiation CT abdomen/pelvis 01/16/2021- recurrent right-sided hydronephrosis and proximal right hydroureter status post removal of right ureter stent, new soft tissue nodule along the course of the right ureter between the right psoas and  IVC suspicious for recurrent lymphadenopathy PET 02/12/2021-small focus of hypermetabolism at the anorectal junction without a definable mass, right retroperitoneal nodal metastasis with obstruction of the  proximal right ureter, right middle lobe hypermetabolic nodule, hypermetabolism in the right hilum without a defined nodal mass, hypermetabolic left supraclavicular and left axillary nodes 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 Cycle 1 Taxol/carboplatin 03/11/2021 Cycle 2 Taxol/carboplatin 03/31/2021, Fulphila Cycle 3 Taxol/carboplatin 04/21/2021, Fulphila CTs 05/11/2021-decrease size of FDG avid nodule in right middle lobe, right middle lobe subpleural nodule slightly enlarged, decreased in size of left axillary and subpectoral nodes, unchanged FDG avid right iliac nodes Cycle 4 Taxol/carboplatin 05/12/2021, G-CSF discontinued secondary to poor tolerance, Taxol and carboplatin dose reduced Cycle 5 Taxol/carboplatin 06/02/2021 Cycle 6 carboplatin 07/01/2021, Taxol held due to neuropathy Cycle 7 carboplatin 07/29/2021, Taxol held due to neuropathy CTs 08/16/2021-new liver lesion, increase in right psoas lymph node, unchanged right hydronephrosis, new mediastinal and right hilar lymphadenopathy, enlargement of bilateral pulmonary nodules and at least one new nodule, new subcarinal node Cycle 1 nivolumab 08/25/2021 Cycle 2 nivolumab 09/08/2021 Cycle 3 nivolumab 09/22/2021 Cycle 4 nivolumab 10/06/2021 Cycle 5 nivolumab 10/20/2021 Cycle 6 nivolumab 11/04/2021 CTs 11/12/2021-resolution of lung nodules, decreased size of right psoas node, no remaining mediastinal lymphadenopathy, resolution of right liver lesion Cycle 7 nivolumab 11/17/2021 Cycle 8 nivolumab 12/01/2021 Cycle 9 nivolumab 12/15/2021 Cycle 10 nivolumab 12/30/2021 Cycle 11 nivolumab 01/12/2022 Cycle 12 nivolumab 5/10/264-monthly dosing Cycle 13 nivolumab 02/23/2022 CT 03/16/2022-stable right psoas  mass, no pulmonary nodules, persistent circumferential anal thickening, unchanged subcentimeter left liver lesion Cycle 14 nivolumab 03/24/2022 Cycle 15 nivolumab 04/21/2022 Cycle 16 nivolumab 05/19/2022 Cycle 17 nivolumab 06/16/2022 Cycle 18 nivolumab 07/14/2022 CTs 08/16/2022-stable ill-defined soft tissue in the right retroperitoneum, very mild intra and EXTR hepatic biliary duct dilation, common bile duct dilated to 11 mm in the porta hepatis Cycle 19 nivolumab 08/18/2022 Cycle 20 nivolumab 09/15/2022 Cycle 21 nivolumab 10/18/2022 Cycle 22 nivolumab 11/15/2022 Cycle 23 nivolumab 12/14/2022 Cycle 24 nivolumab 01/12/2023 CTs 02/07/2023-slight decrease in size of the right psoas mass, no other evidence of metastatic disease Cycle 25 nivolumab 02/09/2023 Cycle 26 nivolumab 03/09/2023 Cycle 27 nivolumab 04/06/2023 Cycle 28 nivolumab 05/04/2023 CT renal stone study 05/21/2023-unchanged soft tissue at the right psoas, severe chronic right hydronephrosis, no adenopathy Cycle 29 nivolumab 06/01/2023 Cycle 30 nivolumab 06/29/2023 Left labial lesions. Question direct extension from the anal cancer versus metastatic disease from anal cancer versus a separate malignant process. 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 Multiple family members with breast cancer. History of hypokalemia-likely secondary to decreased nutritional intake and diarrhea; potassium in normal range 01/16/2017. No longer taking a potassium supplement. Right hydroureteronephrosis on CT 01/28/2020, status post a cystoscopy/pyelogram 01/28/2020 confirming extrinsic compression of the right ureter, status post stent placement Right ureter stent exchange 06/12/2020 Right ureter stent removed 01/13/2021   8.  Admission 01/07/2021 with Klebsiella pneumoniae urosepsis/bacteremia  9.  Hospital admission 05/15/2021 with UTI/possible right  pyelonephritis 10.  05/21/2023-emergency room visit with gross hematuria and dysuria, Klebsiella UTI-cephalexin  Disposition: Ashley Moore appears stable.  She continues every 4-week nivolumab.  She is tolerating well.  There is no clinical evidence of disease progression.  Plan to proceed with treatment today as scheduled.  CBC and chemistry panel reviewed.  Labs adequate to proceed as above.  She will return for follow-up and the final nivolumab treatment in 4 weeks.    Ashley Moore ANP/GNP-BC   06/29/2023  2:22 PM

## 2023-06-29 NOTE — Patient Instructions (Signed)
[place] [facility]   Discharge Instructions: Thank you for choosing [facility] to provide your oncology and hematology care.   If you have a lab appointment with the Cancer Center, please go directly to the Cancer Center and check in at the registration area.   Wear comfortable clothing and clothing appropriate for easy access to any Portacath or PICC line.   We strive to give you quality time with your provider. You may need to reschedule your appointment if you arrive late (15 or more minutes).  Arriving late affects you and other patients whose appointments are after yours.  Also, if you miss three or more appointments without notifying the office, you may be dismissed from the clinic at the provider's discretion.      For prescription refill requests, have your pharmacy contact our office and allow 72 hours for refills to be completed.    Today you received the following chemotherapy and/or immunotherapy agents Opdivo.      To help prevent nausea and vomiting after your treatment, we encourage you to take your nausea medication as directed.  BELOW ARE SYMPTOMS THAT SHOULD BE REPORTED IMMEDIATELY: *FEVER GREATER THAN 100.4 F (38 C) OR HIGHER *CHILLS OR SWEATING *NAUSEA AND VOMITING THAT IS NOT CONTROLLED WITH YOUR NAUSEA MEDICATION *UNUSUAL SHORTNESS OF BREATH *UNUSUAL BRUISING OR BLEEDING *URINARY PROBLEMS (pain or burning when urinating, or frequent urination) *BOWEL PROBLEMS (unusual diarrhea, constipation, pain near the anus) TENDERNESS IN MOUTH AND THROAT WITH OR WITHOUT PRESENCE OF ULCERS (sore throat, sores in mouth, or a toothache) UNUSUAL RASH, SWELLING OR PAIN  UNUSUAL VAGINAL DISCHARGE OR ITCHING   Items with * indicate a potential emergency and should be followed up as soon as possible or go to the Emergency Department if any problems should occur.  Please show the CHEMOTHERAPY ALERT CARD or IMMUNOTHERAPY ALERT CARD at check-in  to the Emergency Department and triage nurse.  Should you have questions after your visit or need to cancel or reschedule your appointment, please contact [place] [facility]  Dept: [phone]  and follow the prompts.  Office hours are 8:00 a.m. to 4:30 p.m. Monday - Friday. Please note that voicemails left after 4:00 p.m. may not be returned until the following business day.  We are closed weekends and major holidays. You have access to a nurse at all times for urgent questions. Please call the main number to the clinic Dept: [phone] and follow the prompts.   For any non-urgent questions, you may also contact your provider using MyChart. We now offer e-Visits for anyone 56 and older to request care online for non-urgent symptoms. For details visit [email or web address].   Also download the MyChart app! Go to the app store, search "MyChart", open the app, select , and log in with your MyChart username and password.  Nivolumab Injection What is this medication? NIVOLUMAB (nye VOL ue mab) treats some types of cancer. It works by helping your immune system slow or stop the spread of cancer cells. It is a monoclonal antibody. This medicine may be used for other purposes; ask your health care provider or pharmacist if you have questions. COMMON BRAND NAME(S): Opdivo What should I tell my care team before I take this medication? They need to know if you have any of these conditions: Allogeneic stem cell transplant (uses someone else's stem cells) Autoimmune diseases, such as Crohn disease, ulcerative colitis, lupus History of chest radiation Nervous system problems,  such as Guillain-Barre syndrome or myasthenia gravis Organ transplant An unusual or allergic reaction to nivolumab, other medications, foods, dyes, or preservatives Pregnant or trying to get pregnant Breast-feeding How should I use this medication? This medication is infused into a vein.  It is given in a hospital or clinic setting. A special MedGuide will be given to you before each treatment. Be sure to read this information carefully each time. Talk to your care team about the use of this medication in children. While it may be prescribed for children as young as 12 years for selected conditions, precautions do apply. Overdosage: If you think you have taken too much of this medicine contact a poison control center or emergency room at once. NOTE: This medicine is only for you. Do not share this medicine with others. What if I miss a dose? Keep appointments for follow-up doses. It is important not to miss your dose. Call your care team if you are unable to keep an appointment. What may interact with this medication? Interactions have not been studied. This list may not describe all possible interactions. Give your health care provider a list of all the medicines, herbs, non-prescription drugs, or dietary supplements you use. Also tell them if you smoke, drink alcohol, or use illegal drugs. Some items may interact with your medicine. What should I watch for while using this medication? Your condition will be monitored carefully while you are receiving this medication. You may need blood work while taking this medication. This medication may cause serious skin reactions. They can happen weeks to months after starting the medication. Contact your care team right away if you notice fevers or flu-like symptoms with a rash. The rash may be red or purple and then turn into blisters or peeling of the skin. You may also notice a red rash with swelling of the face, lips, or lymph nodes in your neck or under your arms. Tell your care team right away if you have any change in your eyesight. Talk to your care team if you are pregnant or think you might be pregnant. A negative pregnancy test is required before starting this medication. A reliable form of contraception is recommended while taking  this medication and for 5 months after the last dose. Talk to your care team about effective forms of contraception. Do not breast-feed while taking this medication and for 5 months after the last dose. What side effects may I notice from receiving this medication? Side effects that you should report to your care team as soon as possible: Allergic reactions--skin rash, itching, hives, swelling of the face, lips, tongue, or throat Dry cough, shortness of breath or trouble breathing Eye pain, redness, irritation, or discharge with blurry or decreased vision Heart muscle inflammation--unusual weakness or fatigue, shortness of breath, chest pain, fast or irregular heartbeat, dizziness, swelling of the ankles, feet, or hands Hormone gland problems--headache, sensitivity to light, unusual weakness or fatigue, dizziness, fast or irregular heartbeat, increased sensitivity to cold or heat, excessive sweating, constipation, hair loss, increased thirst or amount of urine, tremors or shaking, irritability Infusion reactions--chest pain, shortness of breath or trouble breathing, feeling faint or lightheaded Kidney injury (glomerulonephritis)--decrease in the amount of urine, red or dark brown urine, foamy or bubbly urine, swelling of the ankles, hands, or feet Liver injury--right upper belly pain, loss of appetite, nausea, light-colored stool, dark yellow or brown urine, yellowing skin or eyes, unusual weakness or fatigue Pain, tingling, or numbness in the hands or feet, muscle  weakness, change in vision, confusion or trouble speaking, loss of balance or coordination, trouble walking, seizures Rash, fever, and swollen lymph nodes Redness, blistering, peeling, or loosening of the skin, including inside the mouth Sudden or severe stomach pain, bloody diarrhea, fever, nausea, vomiting Side effects that usually do not require medical attention (report these to your care team if they continue or are  bothersome): Bone, joint, or muscle pain Diarrhea Fatigue Loss of appetite Nausea Skin rash This list may not describe all possible side effects. Call your doctor for medical advice about side effects. You may report side effects to FDA at 1-800-FDA-1088. Where should I keep my medication? This medication is given in a hospital or clinic. It will not be stored at home. NOTE: This sheet is a summary. It may not cover all possible information. If you have questions about this medicine, talk to your doctor, pharmacist, or health care provider.  2024 Elsevier/Gold Standard (2022-01-03 00:00:00)

## 2023-06-30 LAB — T4: T4, Total: 10.5 ug/dL (ref 4.5–12.0)

## 2023-07-13 ENCOUNTER — Ambulatory Visit: Payer: Self-pay

## 2023-07-13 DIAGNOSIS — R2232 Localized swelling, mass and lump, left upper limb: Secondary | ICD-10-CM | POA: Diagnosis not present

## 2023-07-13 DIAGNOSIS — M19049 Primary osteoarthritis, unspecified hand: Secondary | ICD-10-CM | POA: Diagnosis not present

## 2023-07-13 DIAGNOSIS — M79642 Pain in left hand: Secondary | ICD-10-CM | POA: Diagnosis not present

## 2023-07-13 DIAGNOSIS — M79641 Pain in right hand: Secondary | ICD-10-CM | POA: Diagnosis not present

## 2023-07-13 NOTE — Patient Instructions (Signed)
Visit Information  Thank you for taking time to visit with me today. Please don't hesitate to contact me if I can be of assistance to you.   Following are the goals we discussed today:   Goals Addressed             This Visit's Progress    " Getting Stronger"       Patient Goals/Self Care Activities: -Patient/Caregiver will self-administer medications as prescribed as evidenced by self-report/primary caregiver report  -Patient/Caregiver will attend all scheduled provider appointments as evidenced by clinician review of documented attendance to scheduled appointments and patient/caregiver report -Patient/Caregiver will call provider office for new concerns or questions as evidenced by review of documented incoming telephone call notes and patient report    Patient reports doing okay. She has some cyst to her fingers.  Appointment for consultation.  She also reports some arthritis and has an appointment for that.  Discussed arthritis.  Last immunotherapy infusion for cancer scheduled. No concerns.          Our next appointment is by telephone on 08/10/23 at 1200 pm  Please call the care guide team at [phone] if you need to cancel or reschedule your appointment.   If you are experiencing a Mental Health or Behavioral Health Crisis or need someone to talk to, please call the Suicide and Crisis Lifeline: 988   Patient verbalizes understanding of instructions and care plan provided today and agrees to view in MyChart. Active MyChart status and patient understanding of how to access instructions and care plan via MyChart confirmed with patient.     The patient has been provided with contact information for the care management team and has been advised to call with any health related questions or concerns.   Bary Leriche, RN, MSN [facility] Management Community Coordinator Direct Dial: [phone]  Fax:  [phone] Website: [email or web address]

## 2023-07-13 NOTE — Patient Outreach (Signed)
Care Coordination   Follow Up Visit Note   07/13/2023 Name: Ashley Moore MRN: [id number] DOB: January 14, 1951  Ashley Moore is a 72 y.o. year old female who sees Panosh, Neta Mends, MD for primary care. I spoke with  Ashley Moore by phone today.  What matters to the patients health and wellness today?       Dealing with arthritis.  Appointment for rheumatology scheduled.    Goals Addressed             This Visit's Progress    " Getting Stronger"       Patient Goals/Self Care Activities: -Patient/Caregiver will self-administer medications as prescribed as evidenced by self-report/primary caregiver report  -Patient/Caregiver will attend all scheduled provider appointments as evidenced by clinician review of documented attendance to scheduled appointments and patient/caregiver report -Patient/Caregiver will call provider office for new concerns or questions as evidenced by review of documented incoming telephone call notes and patient report    Patient reports doing okay. She has some cyst to her fingers.  Appointment for consultation.  She also reports some arthritis and has an appointment for that.  Discussed arthritis.  Last immunotherapy infusion for cancer scheduled. No concerns.          SDOH assessments and interventions completed:  Yes  SDOH Interventions Today    Flowsheet Row Most Recent Value  SDOH Interventions   Health Literacy Interventions Intervention Not Indicated        Care Coordination Interventions:  Yes, provided   Follow up plan: Follow up call scheduled for November    Encounter Outcome:  Patient Visit Completed    Ashley Leriche, RN, MSN [place]  [facility] Management Community Coordinator Direct Dial: [phone]  Fax: [phone] Website: [email or web address]

## 2023-07-15 ENCOUNTER — Other Ambulatory Visit: Payer: Self-pay

## 2023-07-23 ENCOUNTER — Other Ambulatory Visit: Payer: Self-pay | Admitting: Oncology

## 2023-07-26 DIAGNOSIS — H16223 Keratoconjunctivitis sicca, not specified as Sjogren's, bilateral: Secondary | ICD-10-CM | POA: Diagnosis not present

## 2023-07-26 DIAGNOSIS — H16233 Neurotrophic keratoconjunctivitis, bilateral: Secondary | ICD-10-CM | POA: Diagnosis not present

## 2023-07-26 DIAGNOSIS — H0288A Meibomian gland dysfunction right eye, upper and lower eyelids: Secondary | ICD-10-CM | POA: Diagnosis not present

## 2023-07-26 DIAGNOSIS — L718 Other rosacea: Secondary | ICD-10-CM | POA: Diagnosis not present

## 2023-07-26 DIAGNOSIS — H0288B Meibomian gland dysfunction left eye, upper and lower eyelids: Secondary | ICD-10-CM | POA: Diagnosis not present

## 2023-07-27 ENCOUNTER — Encounter: Payer: Self-pay | Admitting: *Deleted

## 2023-07-27 ENCOUNTER — Inpatient Hospital Stay: Payer: Medicare HMO | Admitting: Oncology

## 2023-07-27 ENCOUNTER — Inpatient Hospital Stay: Payer: Medicare HMO

## 2023-07-27 ENCOUNTER — Inpatient Hospital Stay: Payer: Medicare HMO | Attending: Nurse Practitioner

## 2023-07-27 VITALS — BP 116/73 | HR 71 | Temp 98.1°F | Resp 18 | Ht 62.0 in | Wt 165.9 lb

## 2023-07-27 DIAGNOSIS — C210 Malignant neoplasm of anus, unspecified: Secondary | ICD-10-CM

## 2023-07-27 DIAGNOSIS — E039 Hypothyroidism, unspecified: Secondary | ICD-10-CM | POA: Diagnosis not present

## 2023-07-27 DIAGNOSIS — Z79620 Long term (current) use of immunosuppressive biologic: Secondary | ICD-10-CM | POA: Diagnosis not present

## 2023-07-27 DIAGNOSIS — Z5112 Encounter for antineoplastic immunotherapy: Secondary | ICD-10-CM | POA: Diagnosis not present

## 2023-07-27 LAB — CMP (CANCER CENTER ONLY)
ALT: 35 U/L (ref 0–44)
AST: 47 U/L — ABNORMAL HIGH (ref 15–41)
Albumin: 3.9 g/dL (ref 3.5–5.0)
Alkaline Phosphatase: 86 U/L (ref 38–126)
Anion gap: 7 (ref 5–15)
BUN: 18 mg/dL (ref 8–23)
CO2: 26 mmol/L (ref 22–32)
Calcium: 9.9 mg/dL (ref 8.9–10.3)
Chloride: 101 mmol/L (ref 98–111)
Creatinine: 0.91 mg/dL (ref 0.44–1.00)
GFR, Estimated: >60 mL/min (ref >60)
Glucose, Bld: 78 mg/dL (ref 70–99)
Potassium: 4.5 mmol/L (ref 3.5–5.1)
Sodium: 134 mmol/L — ABNORMAL LOW (ref 135–145)
Total Bilirubin: 0.4 mg/dL (ref <1.2)
Total Protein: 7.3 g/dL (ref 6.5–8.1)

## 2023-07-27 LAB — CBC WITH DIFFERENTIAL (CANCER CENTER ONLY)
Abs Immature Granulocytes: 0.01 10*3/uL (ref 0.00–0.07)
Basophils Absolute: 0 10*3/uL (ref 0.0–0.1)
Basophils Relative: 1 %
Eosinophils Absolute: 0.3 10*3/uL (ref 0.0–0.5)
Eosinophils Relative: 8 %
HCT: 37.8 % (ref 36.0–46.0)
Hemoglobin: 12 g/dL (ref 12.0–15.0)
Immature Granulocytes: 0 %
Lymphocytes Relative: 17 %
Lymphs Abs: 0.7 10*3/uL (ref 0.7–4.0)
MCH: 28.4 pg (ref 26.0–34.0)
MCHC: 31.7 g/dL (ref 30.0–36.0)
MCV: 89.4 fL (ref 80.0–100.0)
Monocytes Absolute: 0.4 10*3/uL (ref 0.1–1.0)
Monocytes Relative: 10 %
Neutro Abs: 2.4 10*3/uL (ref 1.7–7.7)
Neutrophils Relative %: 64 %
Platelet Count: 235 10*3/uL (ref 150–400)
RBC: 4.23 MIL/uL (ref 3.87–5.11)
RDW: 14.6 % (ref 11.5–15.5)
WBC Count: 3.8 10*3/uL — ABNORMAL LOW (ref 4.0–10.5)
nRBC: 0 % (ref 0.0–0.2)

## 2023-07-27 LAB — TSH: TSH: 3.652 u[IU]/mL (ref 0.350–4.500)

## 2023-07-27 MED ORDER — SODIUM CHLORIDE 0.9 % IV SOLN: Freq: Once | INTRAVENOUS | Status: AC

## 2023-07-27 MED ORDER — NIVOLUMAB CHEMO INJECTION 100 MG/10ML
480.0000 mg | Freq: Once | INTRAVENOUS | Status: AC
  Administered 2023-07-27: 480 mg via INTRAVENOUS
  Filled 2023-07-27: qty 48

## 2023-07-27 NOTE — Patient Instructions (Signed)
[facility] - A DEPT OF [facility]   Discharge Instructions: Thank you for choosing [facility] to provide your oncology and hematology care.   If you have a lab appointment with the Cancer Center, please go directly to the Cancer Center and check in at the registration area.   Wear comfortable clothing and clothing appropriate for easy access to any Portacath or PICC line.   We strive to give you quality time with your provider. You may need to reschedule your appointment if you arrive late (15 or more minutes).  Arriving late affects you and other patients whose appointments are after yours.  Also, if you miss three or more appointments without notifying the office, you may be dismissed from the clinic at the provider's discretion.      For prescription refill requests, have your pharmacy contact our office and allow 72 hours for refills to be completed.    Today you received the following chemotherapy and/or immunotherapy agents Opdivo.      To help prevent nausea and vomiting after your treatment, we encourage you to take your nausea medication as directed.  BELOW ARE SYMPTOMS THAT SHOULD BE REPORTED IMMEDIATELY: *FEVER GREATER THAN 100.4 F (38 C) OR HIGHER *CHILLS OR SWEATING *NAUSEA AND VOMITING THAT IS NOT CONTROLLED WITH YOUR NAUSEA MEDICATION *UNUSUAL SHORTNESS OF BREATH *UNUSUAL BRUISING OR BLEEDING *URINARY PROBLEMS (pain or burning when urinating, or frequent urination) *BOWEL PROBLEMS (unusual diarrhea, constipation, pain near the anus) TENDERNESS IN MOUTH AND THROAT WITH OR WITHOUT PRESENCE OF ULCERS (sore throat, sores in mouth, or a toothache) UNUSUAL RASH, SWELLING OR PAIN  UNUSUAL VAGINAL DISCHARGE OR ITCHING   Items with * indicate a potential emergency and should be followed up as soon as possible or go to the Emergency Department if any problems should occur.  Please show the CHEMOTHERAPY ALERT CARD or IMMUNOTHERAPY  ALERT CARD at check-in to the Emergency Department and triage nurse.  Should you have questions after your visit or need to cancel or reschedule your appointment, please contact [facility] - A [facility]  Dept: [phone]  and follow the prompts.  Office hours are 8:00 a.m. to 4:30 p.m. Monday - Friday. Please note that voicemails left after 4:00 p.m. may not be returned until the following business day.  We are closed weekends and major holidays. You have access to a nurse at all times for urgent questions. Please call the main number to the clinic Dept: [phone] and follow the prompts.   For any non-urgent questions, you may also contact your provider using MyChart. We now offer e-Visits for anyone 25 and older to request care online for non-urgent symptoms. For details visit [email or web address].   Also download the MyChart app! Go to the app store, search "MyChart", open the app, select [facility] [place], and log in with your MyChart username and password.  Nivolumab Injection What is this medication? NIVOLUMAB (nye VOL ue mab) treats some types of cancer. It works by helping your immune system slow or stop the spread of cancer cells. It is a monoclonal antibody. This medicine may be used for other purposes; ask your health care provider or pharmacist if you have questions. COMMON BRAND NAME(S): Opdivo What should I tell my care team before I take this medication? They need to know if you have any of these conditions: Allogeneic stem cell transplant (uses someone else's stem cells) Autoimmune diseases, such as  Crohn disease, ulcerative colitis, lupus History of chest radiation Nervous system problems, such as Guillain-Barre syndrome or myasthenia gravis Organ transplant An unusual or allergic reaction to nivolumab, other medications, foods, dyes, or preservatives Pregnant or trying to get pregnant Breast-feeding How should I use this  medication? This medication is infused into a vein. It is given in a hospital or clinic setting. A special MedGuide will be given to you before each treatment. Be sure to read this information carefully each time. Talk to your care team about the use of this medication in children. While it may be prescribed for children as young as 12 years for selected conditions, precautions do apply. Overdosage: If you think you have taken too much of this medicine contact a poison control center or emergency room at once. NOTE: This medicine is only for you. Do not share this medicine with others. What if I miss a dose? Keep appointments for follow-up doses. It is important not to miss your dose. Call your care team if you are unable to keep an appointment. What may interact with this medication? Interactions have not been studied. This list may not describe all possible interactions. Give your health care provider a list of all the medicines, herbs, non-prescription drugs, or dietary supplements you use. Also tell them if you smoke, drink alcohol, or use illegal drugs. Some items may interact with your medicine. What should I watch for while using this medication? Your condition will be monitored carefully while you are receiving this medication. You may need blood work while taking this medication. This medication may cause serious skin reactions. They can happen weeks to months after starting the medication. Contact your care team right away if you notice fevers or flu-like symptoms with a rash. The rash may be red or purple and then turn into blisters or peeling of the skin. You may also notice a red rash with swelling of the face, lips, or lymph nodes in your neck or under your arms. Tell your care team right away if you have any change in your eyesight. Talk to your care team if you are pregnant or think you might be pregnant. A negative pregnancy test is required before starting this medication. A reliable  form of contraception is recommended while taking this medication and for 5 months after the last dose. Talk to your care team about effective forms of contraception. Do not breast-feed while taking this medication and for 5 months after the last dose. What side effects may I notice from receiving this medication? Side effects that you should report to your care team as soon as possible: Allergic reactions--skin rash, itching, hives, swelling of the face, lips, tongue, or throat Dry cough, shortness of breath or trouble breathing Eye pain, redness, irritation, or discharge with blurry or decreased vision Heart muscle inflammation--unusual weakness or fatigue, shortness of breath, chest pain, fast or irregular heartbeat, dizziness, swelling of the ankles, feet, or hands Hormone gland problems--headache, sensitivity to light, unusual weakness or fatigue, dizziness, fast or irregular heartbeat, increased sensitivity to cold or heat, excessive sweating, constipation, hair loss, increased thirst or amount of urine, tremors or shaking, irritability Infusion reactions--chest pain, shortness of breath or trouble breathing, feeling faint or lightheaded Kidney injury (glomerulonephritis)--decrease in the amount of urine, red or dark brown urine, foamy or bubbly urine, swelling of the ankles, hands, or feet Liver injury--right upper belly pain, loss of appetite, nausea, light-colored stool, dark yellow or brown urine, yellowing skin or eyes, unusual weakness  or fatigue Pain, tingling, or numbness in the hands or feet, muscle weakness, change in vision, confusion or trouble speaking, loss of balance or coordination, trouble walking, seizures Rash, fever, and swollen lymph nodes Redness, blistering, peeling, or loosening of the skin, including inside the mouth Sudden or severe stomach pain, bloody diarrhea, fever, nausea, vomiting Side effects that usually do not require medical attention (report these to your  care team if they continue or are bothersome): Bone, joint, or muscle pain Diarrhea Fatigue Loss of appetite Nausea Skin rash This list may not describe all possible side effects. Call your doctor for medical advice about side effects. You may report side effects to FDA at 1-800-FDA-1088. Where should I keep my medication? This medication is given in a hospital or clinic. It will not be stored at home. NOTE: This sheet is a summary. It may not cover all possible information. If you have questions about this medicine, talk to your doctor, pharmacist, or health care provider.  2024 Elsevier/Gold Standard (2022-01-03 00:00:00)

## 2023-07-27 NOTE — Progress Notes (Signed)
[facility] OFFICE PROGRESS NOTE   Diagnosis: Anal cancer  INTERVAL HISTORY:   Ashley Moore completed another cycle of nivolumab on 06/29/2023.  No rash or diarrhea.  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  She is scheduled to see rheumatology.  She has chronic intermittent rectal bleeding with bowel movements.  No palpable lymph nodes.  Objective:  Vital signs in last 24 hours:  Blood pressure 116/73, pulse 71, temperature 98.1 F (36.7 C), temperature source Temporal, resp. rate 18, height 5\' 2"  (1.575 m), weight 165 lb 14.4 oz (75.3 kg), SpO2 100%.    HEENT: Neck without mass Lymphatics: No cervical, supraclavicular, axillary, or inguinal nodes Resp: Lungs clear bilaterally Cardio: Regular rate and rhythm GI: No hepatosplenomegaly, mild tenderness in the right lower abdomen, no mass Vascular: Mild edema of the right greater than left lower leg    Lab Results:  Lab Results  Component Value Date   WBC 3.8 (L) 07/27/2023   HGB 12.0 07/27/2023   HCT 37.8 07/27/2023   MCV 89.4 07/27/2023   PLT 235 07/27/2023   NEUTROABS 2.4 07/27/2023    CMP  Lab Results  Component Value Date   NA 134 (L) 07/27/2023   K 4.5 07/27/2023   CL 101 07/27/2023   CO2 26 07/27/2023   GLUCOSE 78 07/27/2023   BUN 18 07/27/2023   CREATININE 0.91 07/27/2023   CALCIUM 9.9 07/27/2023   PROT 7.3 07/27/2023   ALBUMIN 3.9 07/27/2023   AST 47 (H) 07/27/2023   ALT 35 07/27/2023   ALKPHOS 86 07/27/2023   BILITOT 0.4 07/27/2023   GFRNONAA >60 07/27/2023   GFRAA >60 06/17/2020    Lab Results  Component Value Date   CEA1 1.15 03/04/2021   CEA 1.16 03/04/2021     Medications: I have reviewed the patient's current medications.   Assessment/Plan: Anal cancer CT abdomen/pelvis 10/21/2016-thickening of the anus extending to the junction between the anus and rectum with a large stool ball in the rectum and mild fat stranding posterior to the rectum. Enlarged lymph nodes in  the right and left inguinal regions. A few mildly prominent nodes seen posterior to the rectum. 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 PET scan 11/10/2016-markedly hypermetabolic anal mass with hypermetabolic metastases to the groin region bilaterally, left pelvic sidewall and presacral space. Initiation of radiation and cycle 1 5-FU/mitomycin C 11/14/2016 Cycle 2 5-FU/mitomycin C 12/12/2016 (5-FU dose reduced due to mucositis, diarrhea, skin breakdown) Radiation completed 12/23/2016 CT abdomen/pelvis 04/14/2017-resolution of anal mass and bilateral inguinal lymphadenopathy. No residual tumor seen. CT abdomen/pelvis 01/28/2020-right hydroureteronephrosis, transition at the level of the mid right ureter, retroperitoneal lymphadenopathy CT renal stone study 01/28/2020-severe right hydronephrosis and proximal right hydroureter, 8 mm area of increased attenuation in the proximal right ureter-stone versus soft tissue mass PET 02/13/2020-hypermetabolic right retroperitoneal nodal metastases in the aortocaval and posterior pericaval chains, nonspecific mild anal wall hypermetabolism, right nephroureteral stent 02/21/2020 anorectal exam per Dr. Theadora Rama radiation changes of the skin around the anal margin.  Posterior midline smooth scarring.  Mildly stenotic.  Stenosis seems better.  No palpable concerning lesions.  Entire anal canal and distal rectum feels smooth and healthy.  Partial anoscopy performed with no lesions in the anal canal. Xeloda/radiation beginning 03/02/2020, completed 04/13/2020 CT abdomen/pelvis 06/17/2020-resolution of right retroperitoneal lymphadenopathy, no remaining pathologically enlarged lymph nodes, residual mild right hydronephrosis, no evidence of progressive disease, stable anal wall thickening Digital rectal exam by Dr. Roxan Hockey 08/26/2020-posterior midline smooth scarring.  Mildly stenotic.  Stenosis seems better.  No palpable concerning lesions.  Anal  canal feels smooth without palpable concern.  Unable to tolerate anoscopy.  Next visit in 6 months for surveillance. CT abdomen/pelvis 10/28/2020-no evidence of local recurrence or metastatic disease.  Stable mild rectal wall thickening.  New diffuse bladder wall thickening possibly related to prior radiation CT abdomen/pelvis 01/16/2021- recurrent right-sided hydronephrosis and proximal right hydroureter status post removal of right ureter stent, new soft tissue nodule along the course of the right ureter between the right psoas and IVC suspicious for recurrent lymphadenopathy PET 02/12/2021-small focus of hypermetabolism at the anorectal junction without a definable mass, right retroperitoneal nodal metastasis with obstruction of the proximal right ureter, right middle lobe hypermetabolic nodule, hypermetabolism in the right hilum without a defined nodal mass, hypermetabolic left supraclavicular and left axillary nodes 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 Cycle 1 Taxol/carboplatin 03/11/2021 Cycle 2 Taxol/carboplatin 03/31/2021, Fulphila Cycle 3 Taxol/carboplatin 04/21/2021, Fulphila CTs 05/11/2021-decrease size of FDG avid nodule in right middle lobe, right middle lobe subpleural nodule slightly enlarged, decreased in size of left axillary and subpectoral nodes, unchanged FDG avid right iliac nodes Cycle 4 Taxol/carboplatin 05/12/2021, G-CSF discontinued secondary to poor tolerance, Taxol and carboplatin dose reduced Cycle 5 Taxol/carboplatin 06/02/2021 Cycle 6 carboplatin 07/01/2021, Taxol held due to neuropathy Cycle 7 carboplatin 07/29/2021, Taxol held due to neuropathy CTs 08/16/2021-new liver lesion, increase in right psoas lymph node, unchanged right hydronephrosis, new mediastinal and right hilar lymphadenopathy, enlargement of bilateral pulmonary nodules and at least one new nodule, new subcarinal node Cycle 1 nivolumab 08/25/2021 Cycle 2  nivolumab 09/08/2021 Cycle 3 nivolumab 09/22/2021 Cycle 4 nivolumab 10/06/2021 Cycle 5 nivolumab 10/20/2021 Cycle 6 nivolumab 11/04/2021 CTs 11/12/2021-resolution of lung nodules, decreased size of right psoas node, no remaining mediastinal lymphadenopathy, resolution of right liver lesion Cycle 7 nivolumab 11/17/2021 Cycle 8 nivolumab 12/01/2021 Cycle 9 nivolumab 12/15/2021 Cycle 10 nivolumab 12/30/2021 Cycle 11 nivolumab 01/12/2022 Cycle 12 nivolumab 5/10/262-monthly dosing Cycle 13 nivolumab 02/23/2022 CT 03/16/2022-stable right psoas mass, no pulmonary nodules, persistent circumferential anal thickening, unchanged subcentimeter left liver lesion Cycle 14 nivolumab 03/24/2022 Cycle 15 nivolumab 04/21/2022 Cycle 16 nivolumab 05/19/2022 Cycle 17 nivolumab 06/16/2022 Cycle 18 nivolumab 07/14/2022 CTs 08/16/2022-stable ill-defined soft tissue in the right retroperitoneum, very mild intra and EXTR hepatic biliary duct dilation, common bile duct dilated to 11 mm in the porta hepatis Cycle 19 nivolumab 08/18/2022 Cycle 20 nivolumab 09/15/2022 Cycle 21 nivolumab 10/18/2022 Cycle 22 nivolumab 11/15/2022 Cycle 23 nivolumab 12/14/2022 Cycle 24 nivolumab 01/12/2023 CTs 02/07/2023-slight decrease in size of the right psoas mass, no other evidence of metastatic disease Cycle 25 nivolumab 02/09/2023 Cycle 26 nivolumab 03/09/2023 Cycle 27 nivolumab 04/06/2023 Cycle 28 nivolumab 05/04/2023 CT renal stone study 05/21/2023-unchanged soft tissue at the right psoas, severe chronic right hydronephrosis, no adenopathy Cycle 29 nivolumab 06/01/2023 Cycle 30 nivolumab 06/29/2023 05/21/2023: CT renal stone study-chronic right hydronephrosis, stable soft tissue density at the right psoas with circumferential wall thickening at the distal rectum Cycle 31 nivolumab 07/27/2023 Left labial lesions. Question direct extension from the anal cancer versus metastatic disease from anal cancer versus a separate malignant process. 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 Multiple family members with breast cancer. History of hypokalemia-likely secondary to decreased nutritional intake and diarrhea; potassium in normal range 01/16/2017. No longer taking a potassium supplement. 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  Admission 01/07/2021 with Klebsiella pneumoniae urosepsis/bacteremia  9.  Hospital admission 05/15/2021 with UTI/possible right pyelonephritis 10.  05/21/2023-emergency room visit with gross hematuria and dysuria, Klebsiella UTI-cephalexin    Disposition: Ms. Keleher is in clinical remission from anal cancer.  She continues to tolerate the nivolumab well.  She will complete a final dose today.  She completed 2 years of nivolumab.  She understands there is no clear standard timeframe for administering immunotherapy in the setting.  Immunotherapy was discontinued after 1-2 years and many cancers.  She understands there is a chance of developing progressive disease in the future.  She is comfortable discontinuing nivolumab.  She will follow-up with rheumatology to evaluate the hand arthritis.  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  She will follow-up with gastroenterology if she has persistent rectal bleeding.  She last underwent an endoscopy in June 2021. Ms. Norgren will return for an office visit in 4 months.  Thornton Papas, MD  07/27/2023  10:50 AM

## 2023-07-27 NOTE — Progress Notes (Signed)
Patient seen by Dr. Thornton Papas today  Vitals are within treatment parameters:Yes   Labs are within treatment parameters: Yes   Treatment plan has been signed: Yes   Per physician team, Patient is ready for treatment and there are NO modifications to the treatment plan.

## 2023-07-28 ENCOUNTER — Other Ambulatory Visit: Payer: Self-pay

## 2023-07-28 LAB — T4: T4, Total: 9.8 ug/dL (ref 4.5–12.0)

## 2023-08-03 ENCOUNTER — Other Ambulatory Visit: Payer: Self-pay | Admitting: Internal Medicine

## 2023-08-03 DIAGNOSIS — Z1231 Encounter for screening mammogram for malignant neoplasm of breast: Secondary | ICD-10-CM

## 2023-08-07 DIAGNOSIS — M2559 Pain in other specified joint: Secondary | ICD-10-CM | POA: Diagnosis not present

## 2023-08-07 DIAGNOSIS — E669 Obesity, unspecified: Secondary | ICD-10-CM | POA: Diagnosis not present

## 2023-08-07 DIAGNOSIS — Z6831 Body mass index (BMI) 31.0-31.9, adult: Secondary | ICD-10-CM | POA: Diagnosis not present

## 2023-08-07 DIAGNOSIS — M25511 Pain in right shoulder: Secondary | ICD-10-CM | POA: Diagnosis not present

## 2023-08-07 DIAGNOSIS — M1711 Unilateral primary osteoarthritis, right knee: Secondary | ICD-10-CM | POA: Diagnosis not present

## 2023-08-10 ENCOUNTER — Ambulatory Visit: Payer: Self-pay

## 2023-08-10 ENCOUNTER — Telehealth: Payer: Self-pay | Admitting: *Deleted

## 2023-08-10 NOTE — Patient Outreach (Signed)
  Care Coordination   Follow Up Visit Note   08/10/2023 Name: Ashley Moore MRN: [id number] DOB: [birth year]  Ashley Moore is a 72 y.o. year old female who sees Panosh, Neta Mends, MD for primary care. I spoke with  [name] by phone today.  What matters to the patients health and wellness today?  Right shoulder pain.  Seeking out treatment options presently.    Goals Addressed             This Visit's Progress    " Getting Stronger"       Patient Goals/Self Care Activities: -Patient/Caregiver will self-administer medications as prescribed as evidenced by self-report/primary caregiver report  -Patient/Caregiver will attend all scheduled provider appointments as evidenced by clinician review of documented attendance to scheduled appointments and patient/caregiver report -Patient/Caregiver will call provider office for new concerns or questions as evidenced by review of documented incoming telephone call notes and patient report    Patient reports some right shoulder pain.  She reports she is finding out options at this time.  Options limited due to  immunotherapy.  Recent Rheumatoid Arthritis diagnosis. She tries to remain active.  Last immunotherapy infusion for cancer completed.  Follow up in March.  No concerns.          SDOH assessments and interventions completed:  Yes     Care Coordination Interventions:  Yes, provided   Follow up plan: Follow up call scheduled for January    Encounter Outcome:  Patient Visit Completed   Bary Leriche, RN, MSN [place]  [facility], [facility] Management Community Coordinator Direct Dial: [phone]  Fax: [phone] Website: [email or web address]

## 2023-08-10 NOTE — Patient Instructions (Signed)
Visit Information  Thank you for taking time to visit with me today. Please don't hesitate to contact me if I can be of assistance to you.   Following are the goals we discussed today:   Goals Addressed             This Visit's Progress    " Getting Stronger"       Patient Goals/Self Care Activities: -Patient/Caregiver will self-administer medications as prescribed as evidenced by self-report/primary caregiver report  -Patient/Caregiver will attend all scheduled provider appointments as evidenced by clinician review of documented attendance to scheduled appointments and patient/caregiver report -Patient/Caregiver will call provider office for new concerns or questions as evidenced by review of documented incoming telephone call notes and patient report    Patient reports some right shoulder pain.  She reports she is finding out options at this time.  Options limited due to  immunotherapy.  Recent Rheumatoid Arthritis diagnosis. She tries to remain active.  Last immunotherapy infusion for cancer completed.  Follow up in March.  No concerns.          Our next appointment is by telephone on 10/05/23 at 1200 pm  Please call the care guide team at [phone] if you need to cancel or reschedule your appointment.   If you are experiencing a Mental Health or Behavioral Health Crisis or need someone to talk to, please call the Suicide and Crisis Lifeline: 988   Patient verbalizes understanding of instructions and care plan provided today and agrees to view in MyChart. Active MyChart status and patient understanding of how to access instructions and care plan via MyChart confirmed with patient.     The patient has been provided with contact information for the care management team and has been advised to call with any health related questions or concerns.   Bary Leriche, RN, MSN [facility], [facility] Management Community Coordinator Direct  Dial: [phone]  Fax: [phone] Website: [email or web address]

## 2023-08-10 NOTE — Telephone Encounter (Signed)
Patient called to inquire if OK for her to have cortisone injection in her shoulder now since she has completed nivolumab? Yes, per Dr. Truett Perna

## 2023-08-14 DIAGNOSIS — M1711 Unilateral primary osteoarthritis, right knee: Secondary | ICD-10-CM | POA: Diagnosis not present

## 2023-08-21 DIAGNOSIS — M1711 Unilateral primary osteoarthritis, right knee: Secondary | ICD-10-CM | POA: Diagnosis not present

## 2023-08-24 ENCOUNTER — Other Ambulatory Visit: Payer: Self-pay

## 2023-08-27 DIAGNOSIS — M19042 Primary osteoarthritis, left hand: Secondary | ICD-10-CM | POA: Diagnosis not present

## 2023-08-28 DIAGNOSIS — M19011 Primary osteoarthritis, right shoulder: Secondary | ICD-10-CM | POA: Diagnosis not present

## 2023-08-28 DIAGNOSIS — N3281 Overactive bladder: Secondary | ICD-10-CM | POA: Diagnosis not present

## 2023-08-28 DIAGNOSIS — Q525 Fusion of labia: Secondary | ICD-10-CM | POA: Diagnosis not present

## 2023-09-04 ENCOUNTER — Ambulatory Visit: Payer: Medicare HMO

## 2023-09-11 ENCOUNTER — Encounter: Payer: Self-pay | Admitting: Internal Medicine

## 2023-09-17 ENCOUNTER — Other Ambulatory Visit: Payer: Self-pay

## 2023-09-18 DIAGNOSIS — R2232 Localized swelling, mass and lump, left upper limb: Secondary | ICD-10-CM | POA: Diagnosis not present

## 2023-09-18 DIAGNOSIS — M19049 Primary osteoarthritis, unspecified hand: Secondary | ICD-10-CM | POA: Diagnosis not present

## 2023-09-18 DIAGNOSIS — M6740 Ganglion, unspecified site: Secondary | ICD-10-CM | POA: Diagnosis not present

## 2023-09-18 DIAGNOSIS — M069 Rheumatoid arthritis, unspecified: Secondary | ICD-10-CM | POA: Diagnosis not present

## 2023-09-18 DIAGNOSIS — M778 Other enthesopathies, not elsewhere classified: Secondary | ICD-10-CM | POA: Diagnosis not present

## 2023-09-25 DIAGNOSIS — M25511 Pain in right shoulder: Secondary | ICD-10-CM | POA: Diagnosis not present

## 2023-09-25 DIAGNOSIS — M67449 Ganglion, unspecified hand: Secondary | ICD-10-CM | POA: Diagnosis not present

## 2023-09-25 DIAGNOSIS — Z6830 Body mass index (BMI) 30.0-30.9, adult: Secondary | ICD-10-CM | POA: Diagnosis not present

## 2023-09-25 DIAGNOSIS — M1991 Primary osteoarthritis, unspecified site: Secondary | ICD-10-CM | POA: Diagnosis not present

## 2023-09-25 DIAGNOSIS — E669 Obesity, unspecified: Secondary | ICD-10-CM | POA: Diagnosis not present

## 2023-09-25 DIAGNOSIS — R768 Other specified abnormal immunological findings in serum: Secondary | ICD-10-CM | POA: Diagnosis not present

## 2023-09-26 ENCOUNTER — Ambulatory Visit: Payer: Medicare HMO

## 2023-10-03 ENCOUNTER — Ambulatory Visit
Admission: RE | Admit: 2023-10-03 | Discharge: 2023-10-03 | Disposition: A | Discharge status: Home / Self Care | Payer: Medicare HMO | Source: Ambulatory Visit | Attending: Internal Medicine | Admitting: Internal Medicine

## 2023-10-03 DIAGNOSIS — Z1231 Encounter for screening mammogram for malignant neoplasm of breast: Secondary | ICD-10-CM | POA: Diagnosis not present

## 2023-10-05 ENCOUNTER — Ambulatory Visit: Payer: Self-pay

## 2023-10-05 NOTE — Patient Outreach (Signed)
  Care Coordination   Follow Up Visit Note   10/05/2023 Name: Ashley Moore MRN: [id number] DOB: 12-20-1950  Ashley Moore is a 73 y.o. year old female who sees Panosh, Neta Mends, MD for primary care. I spoke with  [name] by phone today.  What matters to the patients health and wellness today?  Maintain health    Goals Addressed             This Visit's Progress    Maintaining health-Hx rectal cancer       Patient Goals/Self Care Activities: -Patient/Caregiver will self-administer medications as prescribed as evidenced by self-report/primary caregiver report  -Patient/Caregiver will attend all scheduled provider appointments as evidenced by clinician review of documented attendance to scheduled appointments and patient/caregiver report -Patient/Caregiver will call provider office for new concerns or questions as evidenced by review of documented incoming telephone call notes and patient report    Patient reports  she feels better since completing immunotherapy for cancer.  Now under surveillance for cancer.  Follow up in March.  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  She needs surgery but putting that off right now.  She continues to explore her options and making sure she has support.   No concerns.          SDOH assessments and interventions completed:  Yes  SDOH Interventions Today    Flowsheet Row Most Recent Value  SDOH Interventions   Food Insecurity Interventions Intervention Not Indicated  Housing Interventions Intervention Not Indicated  Transportation Interventions Intervention Not Indicated  Utilities Interventions Intervention Not Indicated  Health Literacy Interventions Intervention Not Indicated        Care Coordination Interventions:  Yes, provided   Follow up plan: Follow up call scheduled for March    Encounter Outcome:  Patient Visit Completed   Bary Leriche, RN, MSN [place]  [facility], [facility]  Health RN Care Manager Direct Dial: [phone]  Fax: [phone] Website: [email or web address]

## 2023-10-05 NOTE — Patient Instructions (Signed)
Visit Information  Thank you for taking time to visit with me today. Please don't hesitate to contact me if I can be of assistance to you.   Following are the goals we discussed today:   Goals Addressed             This Visit's Progress    Maintaining health-Hx rectal cancer       Patient Goals/Self Care Activities: -Patient/Caregiver will self-administer medications as prescribed as evidenced by self-report/primary caregiver report  -Patient/Caregiver will attend all scheduled provider appointments as evidenced by clinician review of documented attendance to scheduled appointments and patient/caregiver report -Patient/Caregiver will call provider office for new concerns or questions as evidenced by review of documented incoming telephone call notes and patient report    Patient reports  she feels better since completing immunotherapy for cancer.  Now under surveillance for cancer.  Follow up in March.  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  She needs surgery but putting that off right now.  She continues to explore her options and making sure she has support.   No concerns.          Our next appointment is by telephone on 11/30/23 at 1200 pm  Please call the care guide team at [phone] if you need to cancel or reschedule your appointment.   If you are experiencing a Mental Health or Behavioral Health Crisis or need someone to talk to, please call the Suicide and Crisis Lifeline: 988   Patient verbalizes understanding of instructions and care plan provided today and agrees to view in MyChart. Active MyChart status and patient understanding of how to access instructions and care plan via MyChart confirmed with patient.     The patient has been provided with contact information for the care management team and has been advised to call with any health related questions or concerns.   Bary Leriche, RN, MSN [facility], [facility] Health RN Care  Manager Direct Dial: [phone]  Fax: [phone] Website: [email or web address]

## 2023-10-06 ENCOUNTER — Other Ambulatory Visit: Payer: Self-pay

## 2023-10-10 DIAGNOSIS — H0288A Meibomian gland dysfunction right eye, upper and lower eyelids: Secondary | ICD-10-CM | POA: Diagnosis not present

## 2023-10-10 DIAGNOSIS — H16233 Neurotrophic keratoconjunctivitis, bilateral: Secondary | ICD-10-CM | POA: Diagnosis not present

## 2023-10-10 DIAGNOSIS — H35033 Hypertensive retinopathy, bilateral: Secondary | ICD-10-CM | POA: Diagnosis not present

## 2023-10-10 DIAGNOSIS — H2513 Age-related nuclear cataract, bilateral: Secondary | ICD-10-CM | POA: Diagnosis not present

## 2023-10-10 DIAGNOSIS — H0288B Meibomian gland dysfunction left eye, upper and lower eyelids: Secondary | ICD-10-CM | POA: Diagnosis not present

## 2023-10-10 DIAGNOSIS — I10 Essential (primary) hypertension: Secondary | ICD-10-CM | POA: Diagnosis not present

## 2023-10-10 DIAGNOSIS — L718 Other rosacea: Secondary | ICD-10-CM | POA: Diagnosis not present

## 2023-10-10 DIAGNOSIS — H1045 Other chronic allergic conjunctivitis: Secondary | ICD-10-CM | POA: Diagnosis not present

## 2023-10-10 DIAGNOSIS — H16223 Keratoconjunctivitis sicca, not specified as Sjogren's, bilateral: Secondary | ICD-10-CM | POA: Diagnosis not present

## 2023-10-31 ENCOUNTER — Encounter: Payer: Self-pay | Admitting: Internal Medicine

## 2023-10-31 ENCOUNTER — Ambulatory Visit (INDEPENDENT_AMBULATORY_CARE_PROVIDER_SITE_OTHER): Payer: Medicare HMO | Admitting: Internal Medicine

## 2023-10-31 VITALS — BP 106/58 | HR 95 | Temp 97.7°F | Ht 62.0 in | Wt 168.2 lb

## 2023-10-31 DIAGNOSIS — Z79899 Other long term (current) drug therapy: Secondary | ICD-10-CM | POA: Diagnosis not present

## 2023-10-31 DIAGNOSIS — M810 Age-related osteoporosis without current pathological fracture: Secondary | ICD-10-CM | POA: Diagnosis not present

## 2023-10-31 DIAGNOSIS — E039 Hypothyroidism, unspecified: Secondary | ICD-10-CM | POA: Diagnosis not present

## 2023-10-31 DIAGNOSIS — J300 Vasomotor rhinitis: Secondary | ICD-10-CM

## 2023-10-31 MED ORDER — LEVOTHYROXINE SODIUM 75 MCG PO TABS: ORAL_TABLET | ORAL | 2 refills | Status: AC

## 2023-10-31 MED ORDER — AZELASTINE HCL 0.1 % NA SOLN: 2.0000 | Freq: Two times a day (BID) | NASAL | 1 refills | Status: AC

## 2023-10-31 NOTE — Progress Notes (Signed)
Chief Complaint  Patient presents with   Medical Management of Chronic Issues    HPI: RHIAN ASEBEDO 73 y.o. come in for Chronic disease management   Thyroid   labs done recently by Dr Truett Perna  and has been in range as of 11 24 . 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  Gets runny nose  under certain environ but no itching sneezing  Astelin helps  ROS: See pertinent positives and negatives per HPI.  Past Medical History:  Diagnosis Date   Arthritis    Bowen's disease    excised 1992   Chronic low back pain    Chronic radiation cystitis    DDD (degenerative disc disease), cervical    Family history of breast cancer    Family history of lung cancer    Family history of non-Hodgkin's lymphoma    Family history of ovarian cancer    Headache    Hx of varicella    Hydronephrosis of right kidney    urologist--- dr Berneice Heinrich,  malignant,  treated with ureter stent   Hypothyroidism    followed by pcp   Lower urinary tract symptoms (LUTS)    01-12-2021  per pt treated with accupuncture at dr Berneice Heinrich office   Lymphedema of both lower extremities    PICC (peripherally inserted central catheter) in place 01/11/2021   placed at [facility] in [place], [place] for IV antibiotics   Positive blood culture 01/08/2021   Klebsiella Pneumaniae,  treated with daily IV antibiotic   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[facility]) oncologist--- dr Truett Perna   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,  invasive SCC , completed chemo/ radiation 12-23-2016;  recurrent metastatsis retroperitoneal lymphdenopathy,  completed radiation 04-13-2020 residual right ureteral uropathy obstruction   Retroperitoneal lymphadenopathy    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   Sepsis due to  Klebsiella pneumoniae (HCC) 01/07/2021   pt admitted to [facility] in [place],  dx sepsis secondary to acute pyelonephritis with bacteremia , positive blood culture,  discharged 01-11-2021 home daily IV antibiotic   Wears glasses     Family History  Problem Relation Age of Onset   Lung cancer Mother        lung   Breast cancer Mother 39   Ovarian cancer Mother 22   Pancreatitis Father    Breast cancer Sister 43   Breast cancer Maternal Grandmother 57       breast    Breast cancer Maternal Aunt 46       breast   Melanoma Sister    Breast cancer Sister 65   Non-Hodgkin's lymphoma Paternal Grandmother     Social History   Socioeconomic History   Marital status: Widowed    Spouse name: Not on file   Number of children: 0   Years of education: 14   Highest education level: Associate degree: academic program  Occupational History   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, museum  Tobacco Use   Smoking status: Former    Current packs/day: 0.00    Average packs/day: 1 pack/day for 25.0 years (25.0 ttl pk-yrs)    Types: Cigarettes    Start  date: 06/19/1970    Quit date: 06/20/1995    Years since quitting: 28.3   Smokeless tobacco: Never  Vaping Use   Vaping status: Never Used  Substance and Sexual Activity   Alcohol use: Not Currently    Alcohol/week: 0.0 standard drinks of alcohol    Comment: occasional wine   Drug use: Yes    Types: Marijuana    Comment: 01-12-2021  per pt once a week   Sexual activity: Not on file  Other Topics Concern   Not on file  Social History Narrative   H H  of 1      5 pets.   She is a former smoker   Retired Control and instrumentation engineer; Scientist, physiological   etoh   Red wine  1 per night.    Tea green tea and earl gray    Moved from [place] to [place] area in 1983-11-15   1 pregnancy   Husband died spring  2016 cv   Sister died 15-Mar-2015 Bone cancer    3 remaining  sisters            Social Drivers of Corporate investment banker Strain: Low Risk  (06/26/2023)   Overall Financial Resource Strain (CARDIA)    Difficulty of Paying Living Expenses: Not hard at all  Food Insecurity: No Food Insecurity (10/05/2023)   Hunger Vital Sign    Worried About Running Out of Food in the Last Year: Never true    Ran Out of Food in the Last Year: Never true  Transportation Needs: No Transportation Needs (10/05/2023)   PRAPARE - Administrator, Civil Service (Medical): No    Lack of Transportation (Non-Medical): No  Physical Activity: Insufficiently Active (06/26/2023)   Exercise Vital Sign    Days of Exercise per Week: 6 days    Minutes of Exercise per Session: 20 min  Stress: Patient Declined (06/26/2023)   [employer] of Occupational Health - Occupational Stress Questionnaire    Feeling of Stress : Patient declined  Social Connections: Moderately Integrated (06/26/2023)   Social Connection and Isolation Panel [NHANES]    Frequency of Communication with Friends and Family: Patient declined    Frequency of Social Gatherings with Friends and Family: More than three times a week    Attends Religious Services: More than 4 times per year    Active Member of [employer] or Organizations: Yes    Attends Banker Meetings: More than 4 times per year    Marital Status: Widowed    Outpatient Medications Prior to Visit  Medication Sig Dispense Refill   acetaminophen (TYLENOL) 650 MG CR tablet Take 650 mg by mouth 2 (two) times daily.     Ascorbic Acid (VITAMIN C) 500 MG CAPS Take 500 mg by mouth daily.     Calcium Carb-Cholecalciferol 600-10 MG-MCG CAPS Take by mouth.     Cholecalciferol (VITAMIN D-3) 25 MCG (1000 UT) CAPS Take 1,000 Units by mouth daily.     conjugated estrogens (PREMARIN) vaginal cream Place 1 Applicatorful vaginally daily.     Cranberry 500 MG TABS Take 1 tablet by mouth at bedtime.     MYRBETRIQ 50 MG TB24 tablet Take 50 mg  by mouth daily.     OVER THE COUNTER MEDICATION Place 1 drop into both eyes daily as needed (dry eyes).     OVER THE COUNTER MEDICATION Take 1 Scoop by mouth daily. Essential Erskine Emery  Polyethylene Glycol 3350 (MIRALAX PO) Take by mouth.     Prenatal Vit-Fe Fumarate-FA (PRENATAL PO) Take by mouth daily.     TURMERIC PO Take 750 mg by mouth daily.     azelastine (ASTELIN) 0.1 % nasal spray Place 2 sprays into both nostrils 2 (two) times daily. Use in each nostril as directed 30 mL 1   levothyroxine (SYNTHROID) 75 MCG tablet TAKE 1 TABLET EVERY DAY (NEED MD APPOINTMENT FOR REFILLS) 90 tablet 3   protein supplement shake (PREMIER PROTEIN) LIQD Take 11 oz by mouth daily. (Patient not taking: Reported on 10/31/2023)     No facility-administered medications prior to visit.     EXAM:  BP (!) 106/58 (BP Location: Left Arm, Patient Position: Sitting, Cuff Size: Large)   Pulse 95   Temp 97.7 F (36.5 C) (Oral)   Ht 5\' 2"  (1.575 m)   Wt 168 lb 3.2 oz (76.3 kg)   SpO2 94%   BMI 30.76 kg/m   Body mass index is 30.76 kg/m.  GENERAL: vitals reviewed and listed above, alert, oriented, appears well hydrated and in no acute distress has cane but independent ambulatory  HEENT: atraumatic, conjunctiva  clear, no obvious abnormalities on inspection of external nose and ears  NECK: no obvious masses on inspection palpation  LUNGS: clear to auscultation bilaterally, no wheezes, rales or rhonchi, good air movement CV: HRRR, no clubbing cyanosis or  peripheral edema nl cap refill  MS: moves all extremities without noticeable focal  abnormality PSYCH: pleasant and cooperative, no obvious depression or anxiety Lab Results  Component Value Date   WBC 3.8 (L) 07/27/2023   HGB 12.0 07/27/2023   HCT 37.8 07/27/2023   PLT 235 07/27/2023   GLUCOSE 78 07/27/2023   CHOL 156 03/29/2022   TRIG 56 03/29/2022   HDL 94 03/29/2022   LDLCALC 50 03/29/2022   ALT 35 07/27/2023   AST 47 (H) 07/27/2023   NA 134  (L) 07/27/2023   K 4.5 07/27/2023   CL 101 07/27/2023   CREATININE 0.91 07/27/2023   BUN 18 07/27/2023   CO2 26 07/27/2023   TSH 3.652 07/27/2023   INR 1.2 05/16/2021   HGBA1C 5.5 05/11/2015   BP Readings from Last 3 Encounters:  10/31/23 (!) 106/58  07/27/23 116/73  06/29/23 (!) 113/59  Lab review  from Dr. Truett Perna   ASSESSMENT AND PLAN:  Discussed the following assessment and plan:  Hypothyroidism, unspecified type - last tsh in range  continue - Plan: DG Bone Density  Medication management - Plan: DG Bone Density  Vasomotor rhinitis ?  Age related osteoporosis, unspecified pathological fracture presence - Plan: DG Bone Density Bone health   dexa update  can be ordered and intervnetion as appropriate  -Patient advised to return or notify health care team  if  new concerns arise.  Patient Instructions  Good to see you today .  Will  ordered dexa bone density  also for fu to see if what intervention would be helpful.  Refilled thyroid and nose spray  today . Fu 6-12 months .   Neta Mends. Gerrick Ray M.D.

## 2023-10-31 NOTE — Patient Instructions (Addendum)
Good to see you today .  Will  ordered dexa bone density  also for fu to see if what intervention would be helpful.  Refilled thyroid and nose spray  today . Fu 6-12 months .

## 2023-11-13 DIAGNOSIS — M19011 Primary osteoarthritis, right shoulder: Secondary | ICD-10-CM | POA: Diagnosis not present

## 2023-11-20 ENCOUNTER — Encounter: Payer: Self-pay | Admitting: *Deleted

## 2023-11-20 NOTE — Progress Notes (Signed)
 On 11/14/23 faxed surgical clearance signed by Dr. Truett Perna to [facility] [phone]

## 2023-11-21 NOTE — Progress Notes (Signed)
 Received another surgical clearance form from [facility] again today. Faxed original completed form again to [phone] att: Bonney Leitz or Silvestre Mesi

## 2023-11-23 ENCOUNTER — Telehealth: Payer: Self-pay

## 2023-11-23 ENCOUNTER — Ambulatory Visit: Payer: Self-pay | Admitting: Internal Medicine

## 2023-11-23 ENCOUNTER — Telehealth: Payer: Self-pay | Admitting: *Deleted

## 2023-11-23 ENCOUNTER — Telehealth: Admitting: Family Medicine

## 2023-11-23 DIAGNOSIS — G8929 Other chronic pain: Secondary | ICD-10-CM | POA: Diagnosis not present

## 2023-11-23 DIAGNOSIS — M25569 Pain in unspecified knee: Secondary | ICD-10-CM | POA: Diagnosis not present

## 2023-11-23 NOTE — Telephone Encounter (Signed)
 Last office visit: 10/31/2023  Received a surgical clearance from [facility]. Attempted to reach pt to schedule an appt.   Left a detail message for pt to call us back to schedule an appt.

## 2023-11-23 NOTE — Telephone Encounter (Signed)
 Has surgery scheduled in May for right knee replacement. She reports she "Dislocated" the other day and put back in place, has not been evaluated since she "dislocated". Using two canes or a walker to ambulate. Severe pain when ambulating and when pressure applied, zero pain when sitting or at rest. Tylenol is not effective. No other symptoms. Reports she also has broken shoulder but "not to worry about that". Office visit advised, PCP does not have availability today, offered and accepted virtual visit with another provider. Education provided.    Copied from CRM [id number]. Topic: Clinical - Red Word Triage >> Nov 23, 2023  2:19 PM [place] J wrote: [facility] that prompted transfer to Nurse Triage: Patient would like pain medication for knee. Says that pain is at a 10 and it's very hard to walk. Reason for Disposition  [1] SEVERE pain (e.g., excruciating, unable to walk) AND [2] not improved after 2 hours of pain medicine  Answer Assessment - Initial Assessment Questions 1. LOCATION and RADIATION: "Where is the pain located?"      Right knee 2. QUALITY: "What does the pain feel like?"  (e.g., sharp, dull, aching, burning)     Radiates up the leg, sharp pain.  3. SEVERITY: "How bad is the pain?" "What does it keep you from doing?"   (Scale 1-10; or mild, moderate, severe)   -  MILD (1-3): doesn't interfere with normal activities    -  MODERATE (4-7): interferes with normal activities (e.g., work or school) or awakens from sleep, limping    -  SEVERE (8-10): excruciating pain, unable to do any normal activities, unable to walk     Severe 4. ONSET: "When did the pain start?" "Does it come and go, or is it there all the time?"     Started 3 years ago after fracture. 5. RECURRENT: "Have you had this pain before?" If Yes, ask: "When, and what happened then?"     After knee injury 3 years ago 6. SETTING: "Has there been any recent work, exercise or other activity that involved that part of the body?"       no 7. AGGRAVATING FACTORS: "What makes the knee pain worse?" (e.g., walking, climbing stairs, running)     When ambulating or pressure 8. ASSOCIATED SYMPTOMS: "Is there any swelling or redness of the knee?"     Swelling is at her baseline, sore to touch 9. OTHER SYMPTOMS: "Do you have any other symptoms?" (e.g., chest pain, difficulty breathing, fever, calf pain)     Denies  Protocols used: Knee Pain-A-AH

## 2023-11-23 NOTE — Progress Notes (Signed)
 Virtual Visit via Video Note  I connected with Ashley Moore  on 11/23/23 at  5:20 PM EST by a video enabled telemedicine application and verified that I am speaking with the correct person using two identifiers.  Location patient:  Location provider:work or home office Persons participating in the virtual visit: patient, provider  I discussed the limitations and requested verbal permission for telemedicine visit. The patient expressed understanding and agreed to proceed.   HPI:  Acute telemedicine visit for knee issues: -Onset: chronic, hx of injury remotely, OA of knee and having surgery in May, just saw orthopedic doc recently -Symptoms include: some swelling - but not more than usual, sore around the knee cap -Denies: is taking 2 tylenol twice a day - but sometimes it doesn't help -she does not want to take opioid, is wondering what else might help until has the surgery -Pertinent past medical history: see below -Pertinent medication allergies: Allergies  Allergen Reactions   Sulfamethoxazole-Trimethoprim Other (See Comments)    C/o  abd pain and constipation   Benadryl [Diphenhydramine] Other (See Comments)    Tingle all over    Melatonin     Tingle all over    Penicillins Rash    Did it involve swelling of the face/tongue/throat, SOB, or low BP? N Did it involve sudden or severe rash/hives, skin peeling, or any reaction on the inside of your mouth or nose? Y Did you need to seek medical attention at a hospital or doctor's office? N When did it last happen? Almost 50 years Ago      If all above answers are "NO", may proceed with cephalosporin use.    -COVID-19 vaccine status:  Immunization History  Administered Date(s) Administered   Fluad Quad(high Dose 65+) 05/23/2019, 06/18/2020, 07/22/2021, 06/16/2022   Fluad Trivalent(High Dose 65+) 06/01/2023   Influenza Whole 06/25/2008, 08/10/2010   Influenza, High Dose Seasonal PF 06/11/2015, 10/27/2016, 06/21/2017    Influenza,inj,Quad PF,6+ Mos 06/28/2018   PFIZER(Purple Top)SARS-COV-2 Vaccination 11/17/2019, 12/10/2019, 07/02/2020   Pneumococcal Conjugate-13 03/11/2015   Pneumococcal Polysaccharide-23 04/29/2016   Tdap 02/13/2013   Zoster, Live 02/13/2013     ROS: See pertinent positives and negatives per HPI.  Past Medical History:  Diagnosis Date   Arthritis    Bowen's disease    excised 1992   Chronic low back pain    Chronic radiation cystitis    DDD (degenerative disc disease), cervical    Family history of breast cancer    Family history of lung cancer    Family history of non-Hodgkin's lymphoma    Family history of ovarian cancer    Headache    Hx of varicella    Hydronephrosis of right kidney    urologist--- dr Berneice Heinrich,  malignant,  treated with ureter stent   Hypothyroidism    followed by pcp   Lower urinary tract symptoms (LUTS)    01-12-2021  per pt treated with accupuncture at dr Berneice Heinrich office   Lymphedema of both lower extremities    PICC (peripherally inserted central catheter) in place 01/11/2021   placed at [facility] in [place], [place] for IV antibiotics   Positive blood culture 01/08/2021   Klebsiella Pneumaniae,  treated with daily IV antibiotic   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[facility]) oncologist--- dr Truett Perna   dx 02/ 2018,  invasive SCC , completed chemo/ radiation 12-23-2016;  recurrent metastatsis retroperitoneal lymphdenopathy,  completed radiation 04-13-2020 residual right ureteral uropathy obstruction   Retroperitoneal lymphadenopathy    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  s/p radiation completed 07/ 2021   Sepsis due to Klebsiella pneumoniae [facility]) 01/07/2021   pt admitted to [facility] in [place],  dx sepsis secondary to acute pyelonephritis with bacteremia , positive blood culture,  discharged 01-11-2021 home daily IV antibiotic   Wears glasses     Past Surgical History:  Procedure Laterality Date   BREAST BIOPSY Right 11/27/2019   BUNIONECTOMY  Right yrs ago   CYSTOSCOPY W/ URETERAL STENT PLACEMENT Right 06/12/2020   Procedure: CYSTOSCOPY WITH RETROGRADE PYELOGRAM/URETERAL STENT EXCHANGE;  Surgeon: Sebastian Ache, MD;  Location: [facility];  Service: Urology;  Laterality: Right;   CYSTOSCOPY W/ URETERAL STENT PLACEMENT Right 01/13/2021   Procedure: CYSTOSCOPY WITH RETROGRADE PYELOGRAM/URETERAL STENT REMOVALRIGHT;  Surgeon: Sebastian Ache, MD;  Location: [facility];  Service: Urology;  Laterality: Right;  45 MINS   CYSTOSCOPY WITH RETROGRADE PYELOGRAM, URETEROSCOPY AND STENT PLACEMENT Right 01/28/2020   Procedure: CYSTOSCOPY WITH RETROGRADE PYELOGRAM, URETEROSCOPY AND STENT PLACEMENT;  Surgeon: Sebastian Ache, MD;  Location: [facility];  Service: Urology;  Laterality: Right;   IR GENERIC HISTORICAL  11/14/2016   IR US GUIDE VASC ACCESS RIGHT 11/14/2016 [facility]   IR GENERIC HISTORICAL  11/14/2016   IR FLUORO GUIDE CV LINE RIGHT 11/14/2016 [facility]   IR GENERIC HISTORICAL  12/12/2016   IR US GUIDE VASC ACCESS RIGHT 12/12/2016 Simonne Come, MD [facility]   IR GENERIC HISTORICAL  12/12/2016   IR FLUORO GUIDE CV LINE RIGHT 12/12/2016 Simonne Come, MD [facility]   SKIN CANCER EXCISION  1990   bowens disease     Current Outpatient Medications:    acetaminophen (TYLENOL) 650 MG CR tablet, Take 650 mg by mouth 2 (two) times daily., Disp: , Rfl:    Ascorbic Acid (VITAMIN C) 500 MG CAPS, Take 500 mg by mouth daily., Disp: , Rfl:    azelastine (ASTELIN) 0.1 % nasal spray, Place 2 sprays into both nostrils 2 (two) times daily. Use in each nostril as directed, Disp: 30 mL, Rfl: 1   Calcium Carb-Cholecalciferol 600-10 MG-MCG CAPS, Take by mouth., Disp: , Rfl:    Cholecalciferol (VITAMIN D-3) 25 MCG (1000 UT) CAPS, Take 1,000 Units by mouth daily., Disp: , Rfl:    conjugated estrogens (PREMARIN) vaginal cream, Place 1 Applicatorful vaginally daily., Disp: , Rfl:    Cranberry 500 MG TABS, Take 1 tablet by  mouth at bedtime., Disp: , Rfl:    levothyroxine (SYNTHROID) 75 MCG tablet, TAKE 1 TABLET EVERY DAY, Disp: 90 tablet, Rfl: 2   MYRBETRIQ 50 MG TB24 tablet, Take 50 mg by mouth daily., Disp: , Rfl:    OVER THE COUNTER MEDICATION, Place 1 drop into both eyes daily as needed (dry eyes)., Disp: , Rfl:    OVER THE COUNTER MEDICATION, Take 1 Scoop by mouth daily. Essential Orange, Disp: , Rfl:    Polyethylene Glycol 3350 (MIRALAX PO), Take by mouth., Disp: , Rfl:    Prenatal Vit-Fe Fumarate-FA (PRENATAL PO), Take by mouth daily., Disp: , Rfl:    TURMERIC PO, Take 750 mg by mouth daily., Disp: , Rfl:   EXAM:  VITALS per patient if applicable:  GENERAL: alert, oriented, appears well and in no acute distress  PSYCH/NEURO: pleasant and cooperative, no obvious depression or anxiety, speech and thought processing grossly intact  ASSESSMENT AND PLAN:  Discussed the following assessment and plan:  Chronic knee pain, unspecified laterality  -we discussed possible serious and likely etiologies, options for evaluation and workup, limitations  of telemedicine visit vs in person visit, treatment, treatment risks and precautions. Pt is agreeable to treatment via telemedicine at this moment. Discussed various options from ice, elevation, compression wrap to various medications for pain. She is considering trying some naproxen and discussed dosing/otc options. She plans to ask her oncologist first tomorrow before trying as in the past could not have nsaids when was on chemo - but denies allergy. Discussed top nsaids (did not work) and inj (reports can't do per ortho for 6 months prior to surgery.) In interim will cont tyelnol  (discussed dosing) and ice/elevation.   Advised to seek prompt virtual visit or in person care if worsening, new symptoms arise, or if is not improving with treatment as expected per our conversation of expected course. Discussed options for follow up care. Did let this patient know that I  do telemedicine on Tuesdays and Thursdays for [place] and those are the days I am logged into the system. Advised to schedule follow up visit with PCP, [place] virtual visits or [facility] if any further questions or concerns to avoid delays in care.   I discussed the assessment and treatment plan with the patient. Spent 15 minutes on this visit.  The patient was provided an opportunity to ask questions and all were answered. The patient agreed with the plan and demonstrated an understanding of the instructions.     Terressa Koyanagi, DO

## 2023-11-23 NOTE — Telephone Encounter (Signed)
 Copied from CRM [id number]. Topic: General - Call Back - No Documentation >> Nov 23, 2023 11:11 AM Sim Boast F wrote: Reason for CRM: Patient returning Karpuih's phone call, she declined appointment to schedule an appointment several times and would like a call back and why she has to come in if she seen provider almost a month ago.

## 2023-11-23 NOTE — Patient Instructions (Signed)
 Check with your docs to see if naproxen would be ok. Do not take more than what is listed on the bottle. Take as little as needed for the bad days.  Check with your orthopedic doc as well to see what they would suggest.   I hope you are feeling better soon!  Seek in person care promptly if your symptoms worsen, new concerns arise or you are not improving with treatment.  It was nice to meet you today. I help [facility] out with telemedicine visits on Tuesdays and Thursdays and am happy to help if you need a virtual follow up visit on those days. Otherwise, if you have any concerns or questions following this visit please schedule a follow up visit with your Primary Care office or seek care at a local urgent care clinic to avoid delays in care. If you are having severe or life threatening symptoms please call 911 and/or go to the nearest emergency room.

## 2023-11-24 ENCOUNTER — Inpatient Hospital Stay: Payer: Self-pay

## 2023-11-24 ENCOUNTER — Inpatient Hospital Stay: Payer: Medicare HMO | Attending: Nurse Practitioner | Admitting: Nurse Practitioner

## 2023-11-24 ENCOUNTER — Telehealth: Payer: Self-pay

## 2023-11-24 ENCOUNTER — Other Ambulatory Visit: Payer: Medicare HMO

## 2023-11-24 ENCOUNTER — Encounter: Payer: Self-pay | Admitting: Nurse Practitioner

## 2023-11-24 VITALS — BP 125/69 | HR 74 | Temp 98.1°F | Resp 18 | Ht 62.0 in | Wt 169.0 lb

## 2023-11-24 DIAGNOSIS — Z9221 Personal history of antineoplastic chemotherapy: Secondary | ICD-10-CM | POA: Insufficient documentation

## 2023-11-24 DIAGNOSIS — C210 Malignant neoplasm of anus, unspecified: Secondary | ICD-10-CM | POA: Diagnosis not present

## 2023-11-24 DIAGNOSIS — E039 Hypothyroidism, unspecified: Secondary | ICD-10-CM

## 2023-11-24 DIAGNOSIS — Z08 Encounter for follow-up examination after completed treatment for malignant neoplasm: Secondary | ICD-10-CM | POA: Diagnosis not present

## 2023-11-24 DIAGNOSIS — C772 Secondary and unspecified malignant neoplasm of intra-abdominal lymph nodes: Secondary | ICD-10-CM | POA: Diagnosis not present

## 2023-11-24 DIAGNOSIS — Z85048 Personal history of other malignant neoplasm of rectum, rectosigmoid junction, and anus: Secondary | ICD-10-CM | POA: Diagnosis not present

## 2023-11-24 LAB — TSH: TSH: 1.93 u[IU]/mL (ref 0.350–4.500)

## 2023-11-24 NOTE — Telephone Encounter (Signed)
 Copied from CRM [id number]. Topic: General - Other >> Nov 24, 2023 11:02 AM Almira Coaster wrote: Reason for CRM: Sherri from [facility] is calling to follow up on surgical clearance form that was faxed on 11/13/2023. They are pending the clearance prior to scheduling the patient for Right knee replacement. Their fax number is [phone] attention [employer] or Silvestre Mesi.

## 2023-11-24 NOTE — Progress Notes (Signed)
 [facility] OFFICE PROGRESS NOTE   Diagnosis: Anal cancer  INTERVAL HISTORY:   Ashley Moore returns as scheduled.  In general she feels well.  Only complaint is right knee pain.  She "wrenched" the right knee recently and has noted a significant increase in pain.  She reports she will be undergoing a right knee replacement in early May.  She has a good appetite and good energy level.  No cough or shortness of breath.  No diarrhea.  Bowels moving regularly with the aid of MiraLAX.  No pain or blood with bowel movements.  No abdominal pain.  Objective:  Vital signs in last 24 hours:  Blood pressure 125/69, pulse 74, temperature 98.1 F (36.7 C), temperature source Temporal, resp. rate 18, height 5\' 2"  (1.575 m), weight 169 lb (76.7 kg), SpO2 92%.    Lymphatics: No palpable cervical, supraclavicular, axillary or inguinal lymph nodes. Resp: Lungs with faint rales at bilateral base.  No respiratory distress. Cardio: Regular rate and rhythm. GI: Abdomen is soft.  No hepatosplenomegaly.  Chronic tenderness at the right lower abdomen.  No mass. Vascular: Trace edema lower leg bilaterally.   Lab Results:  Lab Results  Component Value Date   WBC 3.8 (L) 07/27/2023   HGB 12.0 07/27/2023   HCT 37.8 07/27/2023   MCV 89.4 07/27/2023   PLT 235 07/27/2023   NEUTROABS 2.4 07/27/2023    Imaging:  No results found.  Medications: I have reviewed the patient's current medications.  Assessment/Plan: Anal cancer CT abdomen/pelvis 10/21/2016-thickening of the anus extending to the junction between the anus and rectum with a large stool ball in the rectum and mild fat stranding posterior to the rectum. Enlarged lymph nodes in the right and left inguinal regions. A few mildly prominent nodes seen posterior to the rectum. 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 PET scan 11/10/2016-markedly hypermetabolic anal mass with hypermetabolic metastases to the groin  region bilaterally, left pelvic sidewall and presacral space. Initiation of radiation and cycle 1 5-FU/mitomycin C 11/14/2016 Cycle 2 5-FU/mitomycin C 12/12/2016 (5-FU dose reduced due to mucositis, diarrhea, skin breakdown) Radiation completed 12/23/2016 CT abdomen/pelvis 04/14/2017-resolution of anal mass and bilateral inguinal lymphadenopathy. No residual tumor seen. CT abdomen/pelvis 01/28/2020-right hydroureteronephrosis, transition at the level of the mid right ureter, retroperitoneal lymphadenopathy CT renal stone study 01/28/2020-severe right hydronephrosis and proximal right hydroureter, 8 mm area of increased attenuation in the proximal right ureter-stone versus soft tissue mass PET 02/13/2020-hypermetabolic right retroperitoneal nodal metastases in the aortocaval and posterior pericaval chains, nonspecific mild anal wall hypermetabolism, right nephroureteral stent 02/21/2020 anorectal exam per Dr. Theadora Rama radiation changes of the skin around the anal margin.  Posterior midline smooth scarring.  Mildly stenotic.  Stenosis seems better.  No palpable concerning lesions.  Entire anal canal and distal rectum feels smooth and healthy.  Partial anoscopy performed with no lesions in the anal canal. Xeloda/radiation beginning 03/02/2020, completed 04/13/2020 CT abdomen/pelvis 06/17/2020-resolution of right retroperitoneal lymphadenopathy, no remaining pathologically enlarged lymph nodes, residual mild right hydronephrosis, no evidence of progressive disease, stable anal wall thickening Digital rectal exam by Dr. Roxan Hockey 08/26/2020-posterior midline smooth scarring.  Mildly stenotic.  Stenosis seems better.  No palpable concerning lesions.  Anal canal feels smooth without palpable concern.  Unable to tolerate anoscopy.  Next visit in 6 months for surveillance. CT abdomen/pelvis 10/28/2020-no evidence of local recurrence or metastatic disease.  Stable mild rectal wall thickening.  New diffuse bladder wall  thickening possibly related to prior radiation CT abdomen/pelvis  01/16/2021- recurrent right-sided hydronephrosis and proximal right hydroureter status post removal of right ureter stent, new soft tissue nodule along the course of the right ureter between the right psoas and IVC suspicious for recurrent lymphadenopathy PET 02/12/2021-small focus of hypermetabolism at the anorectal junction without a definable mass, right retroperitoneal nodal metastasis with obstruction of the proximal right ureter, right middle lobe hypermetabolic nodule, hypermetabolism in the right hilum without a defined nodal mass, hypermetabolic left supraclavicular and left axillary nodes 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 Cycle 1 Taxol/carboplatin 03/11/2021 Cycle 2 Taxol/carboplatin 03/31/2021, Fulphila Cycle 3 Taxol/carboplatin 04/21/2021, Fulphila CTs 05/11/2021-decrease size of FDG avid nodule in right middle lobe, right middle lobe subpleural nodule slightly enlarged, decreased in size of left axillary and subpectoral nodes, unchanged FDG avid right iliac nodes Cycle 4 Taxol/carboplatin 05/12/2021, G-CSF discontinued secondary to poor tolerance, Taxol and carboplatin dose reduced Cycle 5 Taxol/carboplatin 06/02/2021 Cycle 6 carboplatin 07/01/2021, Taxol held due to neuropathy Cycle 7 carboplatin 07/29/2021, Taxol held due to neuropathy CTs 08/16/2021-new liver lesion, increase in right psoas lymph node, unchanged right hydronephrosis, new mediastinal and right hilar lymphadenopathy, enlargement of bilateral pulmonary nodules and at least one new nodule, new subcarinal node Cycle 1 nivolumab 08/25/2021 Cycle 2 nivolumab 09/08/2021 Cycle 3 nivolumab 09/22/2021 Cycle 4 nivolumab 10/06/2021 Cycle 5 nivolumab 10/20/2021 Cycle 6 nivolumab 11/04/2021 CTs 11/12/2021-resolution of lung nodules, decreased size of right psoas node, no remaining mediastinal lymphadenopathy,  resolution of right liver lesion Cycle 7 nivolumab 11/17/2021 Cycle 8 nivolumab 12/01/2021 Cycle 9 nivolumab 12/15/2021 Cycle 10 nivolumab 12/30/2021 Cycle 11 nivolumab 01/12/2022 Cycle 12 nivolumab 5/10/298-monthly dosing Cycle 13 nivolumab 02/23/2022 CT 03/16/2022-stable right psoas mass, no pulmonary nodules, persistent circumferential anal thickening, unchanged subcentimeter left liver lesion Cycle 14 nivolumab 03/24/2022 Cycle 15 nivolumab 04/21/2022 Cycle 16 nivolumab 05/19/2022 Cycle 17 nivolumab 06/16/2022 Cycle 18 nivolumab 07/14/2022 CTs 08/16/2022-stable ill-defined soft tissue in the right retroperitoneum, very mild intra and EXTR hepatic biliary duct dilation, common bile duct dilated to 11 mm in the porta hepatis Cycle 19 nivolumab 08/18/2022 Cycle 20 nivolumab 09/15/2022 Cycle 21 nivolumab 10/18/2022 Cycle 22 nivolumab 11/15/2022 Cycle 23 nivolumab 12/14/2022 Cycle 24 nivolumab 01/12/2023 CTs 02/07/2023-slight decrease in size of the right psoas mass, no other evidence of metastatic disease Cycle 25 nivolumab 02/09/2023 Cycle 26 nivolumab 03/09/2023 Cycle 27 nivolumab 04/06/2023 Cycle 28 nivolumab 05/04/2023 CT renal stone study 05/21/2023-unchanged soft tissue at the right psoas, severe chronic right hydronephrosis, no adenopathy Cycle 29 nivolumab 06/01/2023 Cycle 30 nivolumab 06/29/2023 05/21/2023: CT renal stone study-chronic right hydronephrosis, stable soft tissue density at the right psoas with circumferential wall thickening at the distal rectum Cycle 31 nivolumab 07/27/2023 Left labial lesions. Question direct extension from the anal cancer versus metastatic disease from anal cancer versus a separate malignant process. 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 Multiple family members with breast cancer. History of hypokalemia-likely secondary to decreased nutritional intake and  diarrhea; potassium in normal range 01/16/2017. No longer taking a potassium supplement. Right hydroureteronephrosis on CT 01/28/2020, status post a cystoscopy/pyelogram 01/28/2020 confirming extrinsic compression of the right ureter, status post stent placement Right ureter stent exchange 06/12/2020 Right ureter stent removed 01/13/2021   8.  Admission 01/07/2021 with Klebsiella pneumoniae urosepsis/bacteremia  9.  Hospital admission 05/15/2021 with UTI/possible right pyelonephritis 10.  05/21/2023-emergency room visit with gross hematuria and dysuria, Klebsiella UTI-cephalexin    Disposition: Ms. Welden remains in  clinical remission from anal cancer.  Plan for restaging CTs in approximately 4 months.  We will follow-up on the thyroid studies from today.  Office visit in 4 months about 1 week after CTs.  She will contact the office in the interim with any problems.    Ashley Moore ANP/GNP-BC   11/24/2023  2:46 PM

## 2023-11-25 ENCOUNTER — Other Ambulatory Visit: Payer: Self-pay

## 2023-11-25 LAB — T4: T4, Total: 11 ug/dL (ref 4.5–12.0)

## 2023-11-29 NOTE — Telephone Encounter (Signed)
 Attempted to reach pt. Left a detail message that the surgical clearance form was faxed to [facility]. And to call [place] back if have any questions.

## 2023-11-29 NOTE — Telephone Encounter (Signed)
 Please send over the form  that I put on your desk   and hope they will honor  this asI do  think  a new inperson visit is needed to confrim optimized for surgery.

## 2023-11-29 NOTE — Telephone Encounter (Signed)
 Surgical clearance form was faxed in today.

## 2023-11-30 ENCOUNTER — Ambulatory Visit: Payer: Self-pay

## 2023-11-30 NOTE — Patient Instructions (Signed)
 Visit Information  Thank you for taking time to visit with me today. Please don't hesitate to contact me if I can be of assistance to you.   Following are the goals we discussed today:   Goals Addressed             This Visit's Progress    Maintaining health-Hx rectal cancer and pending right knee surgery       Patient Goals/Self Care Activities: -Patient/Caregiver will self-administer medications as prescribed as evidenced by self-report/primary caregiver report  -Patient/Caregiver will attend all scheduled provider appointments as evidenced by clinician review of documented attendance to scheduled appointments and patient/caregiver report -Patient/Caregiver will call provider office for new concerns or questions as evidenced by review of documented incoming telephone call notes and patient report    Patient reports  she has injured her leg. She reports she is walking with a walker presently. She reports that she will be having a right knee replacement possible May 1.  She has support person to stay with her after surgery.  Seeing orthopedic.  No issues from her cancer.  Oncology is pleased with her progress, surveillance continues.  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    No concerns.          Our next appointment is by telephone on 01/03/24 at 1145 am  Please call the care guide team at [phone] if you need to cancel or reschedule your appointment.   If you are experiencing a Mental Health or Behavioral Health Crisis or need someone to talk to, please call the Suicide and Crisis Lifeline: 988   Patient verbalizes understanding of instructions and care plan provided today and agrees to view in MyChart. Active MyChart status and patient understanding of how to access instructions and care plan via MyChart confirmed with patient.     The patient has been provided with contact information for the care management team and has been advised to call with any health related questions  or concerns.   Bary Leriche RN, MSN [facility], [facility] RN Care Manager Direct Dial: [phone]  Fax: [phone] Website: [email or web address]

## 2023-11-30 NOTE — Telephone Encounter (Signed)
 Pt was faxed to [employer].

## 2023-11-30 NOTE — Patient Outreach (Signed)
 Care Coordination   Follow Up Visit Note   11/30/2023 Name: Ashley Moore MRN: [id number] DOB: 1951/05/23  Ashley Moore is a 73 y.o. year old female who sees Panosh, Neta Mends, MD for primary care. I spoke with  [name] by phone today.  What matters to the patients health and wellness today?  Pending right knee surgery    Goals Addressed             This Visit's Progress    Maintaining health-Hx rectal cancer and pending right knee surgery       Patient Goals/Self Care Activities: -Patient/Caregiver will self-administer medications as prescribed as evidenced by self-report/primary caregiver report  -Patient/Caregiver will attend all scheduled provider appointments as evidenced by clinician review of documented attendance to scheduled appointments and patient/caregiver report -Patient/Caregiver will call provider office for new concerns or questions as evidenced by review of documented incoming telephone call notes and patient report    Patient reports  she has injured her leg. She reports she is walking with a walker presently. She reports that she will be having a right knee replacement possible May 1.  She has support person to stay with her after surgery.  Seeing orthopedic.  No issues from her cancer.  Oncology is pleased with her progress, surveillance continues.  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    No concerns.          SDOH assessments and interventions completed:  Yes     Care Coordination Interventions:  Yes, provided   Follow up plan: Follow up call scheduled for April    Encounter Outcome:  Patient Visit Completed   Bary Leriche RN, MSN [facility], [facility] RN Care Manager Direct Dial: [phone]  Fax: [phone] Website: [email or web address]

## 2023-12-13 DIAGNOSIS — M1712 Unilateral primary osteoarthritis, left knee: Secondary | ICD-10-CM | POA: Diagnosis not present

## 2023-12-19 ENCOUNTER — Other Ambulatory Visit: Payer: Self-pay

## 2023-12-20 DIAGNOSIS — M1712 Unilateral primary osteoarthritis, left knee: Secondary | ICD-10-CM | POA: Diagnosis not present

## 2023-12-26 DIAGNOSIS — M1711 Unilateral primary osteoarthritis, right knee: Secondary | ICD-10-CM | POA: Diagnosis not present

## 2023-12-27 DIAGNOSIS — M1712 Unilateral primary osteoarthritis, left knee: Secondary | ICD-10-CM | POA: Diagnosis not present

## 2024-01-02 DIAGNOSIS — M1711 Unilateral primary osteoarthritis, right knee: Secondary | ICD-10-CM | POA: Diagnosis not present

## 2024-01-03 ENCOUNTER — Other Ambulatory Visit: Payer: Self-pay

## 2024-01-03 DIAGNOSIS — S46011A Strain of muscle(s) and tendon(s) of the rotator cuff of right shoulder, initial encounter: Secondary | ICD-10-CM | POA: Diagnosis not present

## 2024-01-03 NOTE — Patient Outreach (Signed)
 Complex Care Management   Visit Note  01/03/2024  Name:  Ashley Moore MRN: [id number] DOB: Oct 12, 1950  Situation: Referral received for Complex Care Management related to  Osteoarthritis and Remission stage of Anal Cancer  I obtained verbal consent from Patient.  Visit completed with RNCM  on the phone  Background:   Past Medical History:  Diagnosis Date   Arthritis    Bowen's disease    excised 1992   Chronic low back pain    Chronic radiation cystitis    DDD (degenerative disc disease), cervical    Family history of breast cancer    Family history of lung cancer    Family history of non-Hodgkin's lymphoma    Family history of ovarian cancer    Headache    Hx of varicella    Hydronephrosis of right kidney    urologist--- dr Berneice Heinrich,  malignant,  treated with ureter stent   Hypothyroidism    followed by pcp   Lower urinary tract symptoms (LUTS)    01-12-2021  per pt treated with accupuncture at dr Berneice Heinrich office   Lymphedema of both lower extremities    PICC (peripherally inserted central catheter) in place 01/11/2021   placed at [facility] in [place], [place] for IV antibiotics   Positive blood culture 01/08/2021   Klebsiella Pneumaniae,  treated with daily IV antibiotic   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[facility]) oncologist--- dr Truett Perna   dx 02/ 2018,  invasive SCC , completed chemo/ radiation 12-23-2016;  recurrent metastatsis retroperitoneal lymphdenopathy,  completed radiation 04-13-2020 residual right ureteral uropathy obstruction   Retroperitoneal lymphadenopathy    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   Sepsis due to Klebsiella pneumoniae (HCC) 01/07/2021   pt admitted to [facility] in [place],  dx sepsis secondary to acute pyelonephritis with bacteremia , positive blood culture,  discharged 01-11-2021 home daily IV antibiotic   Wears glasses     Assessment: Patient Reported Symptoms:  Cognitive Cognitive Status: Alert  and oriented to person, place, and time   Health Maintenance Behaviors: Annual physical exam, Exercise, Healthy diet  Neurological      HEENT HEENT Symptoms Reported: No symptoms reported      Cardiovascular Cardiovascular Symptoms Reported: No symptoms reported Does patient have uncontrolled Hypertension?: No    Respiratory Respiratory Symptoms Reported: No symptoms reported    Endocrine Is patient diabetic?: No Endocrine Conditions: Thyroid disorder Endocrine Management Strategies: Medication therapy, Routine screening Endocrine Self-Management Outcome: 4 (good)  Gastrointestinal Gastrointestinal Symptoms Reported: No symptoms reported Additional Gastrointestinal Details: Was treated for Anal Cancer and has been in remission for past 2 years Gastrointestinal Management Strategies: Nutrition support, Medication therapy, Exercise, Coping strategies Gastrointestinal Self-Management Outcome: 4 (good) Nutrition Risk Screen (CP): No indicators present  Genitourinary  No symptoms reported    Integumentary Integumentary Symptoms Reported: No symptoms reported    Musculoskeletal     Falls in the past year?: Yes Number of falls in past year: 2 or more Was there an injury with Fall?: No Fall Risk Category Calculator: 2 Patient Fall Risk Level: Moderate Fall Risk    Psychosocial       Do you feel physically threatened by others?: No      01/03/2024    9:23 PM  Depression screen PHQ 2/9  Decreased Interest 0  Down, Depressed, Hopeless 0  PHQ - 2 Score 0    There were no vitals filed for this visit.  Medications Reviewed Today  Reviewed by Randye Buttner, RN (Registered Nurse) on 01/03/24 at 1147  Med List Status: <None>   Medication Order Taking? Sig Documenting Provider Last Dose Status Informant  acetaminophen (TYLENOL) 650 MG CR tablet [id number]  Take 650 mg by mouth 2 (two) times daily.  Patient not taking: Reported on 11/24/2023   [provider]   Active   Ascorbic Acid (VITAMIN C) 500 MG CAPS [id number]  Take 500 mg by mouth daily. [provider]  Active   azelastine (ASTELIN) 0.1 % nasal spray [id number]  Place 2 sprays into both nostrils 2 (two) times daily. Use in each nostril as directed Panosh, Joaquim Muir, MD  Active   Calcium Carb-Cholecalciferol 600-10 MG-MCG CAPS [id number]  Take by mouth. [provider]  Active   Cholecalciferol (VITAMIN D-3) 25 MCG (1000 UT) CAPS [id number]  Take 1,000 Units by mouth daily. [provider]  Active   Collagen Hydrolysate POWD [id number]  by Does not apply route daily. Mix in liquid daily [provider]  Active   conjugated estrogens (PREMARIN) vaginal cream [id number]  Place 1 Applicatorful vaginally daily. [provider]  Active Self  Cranberry 500 MG TABS [id number]  Take 1 tablet by mouth at bedtime. [provider]  Active            Med Note Joy Nipple Apr 05, 2023  1:26 PM)    levothyroxine (SYNTHROID) 75 MCG tablet [id number]  TAKE 1 TABLET EVERY DAY Panosh, Wanda K, MD  Active   MYRBETRIQ 50 MG TB24 tablet [id number]  Take 50 mg by mouth daily. [provider]  Active Self  naproxen sodium (ALEVE) 220 MG tablet [id number]  Take 440 mg by mouth daily as needed. [provider]  Active   OVER THE COUNTER MEDICATION [id number]  Place 1 drop into both eyes daily as needed (dry eyes). [provider]  Active Self           Med Note Sharion Davidson, MICHELE   Wed Apr 05, 2023  1:26 PM)    Polyethylene Glycol 3350 (MIRALAX PO) [id number]  Take by mouth.  Patient not taking: Reported on 11/24/2023   [provider]  Active   Prenatal Vit-Fe Fumarate-FA (PRENATAL PO) [id number]  Take by mouth daily. [provider]  Active   TURMERIC PO [id number]  Take 750 mg by mouth daily. [provider]  Active             Recommendation:   Continue home exercise plan in preparation of 02/27/2024 Right  Total Knee Arthroplasty   Follow Up Plan:   Telephone follow up appointment date/time:  03/08/2024 at 2pm.  Bartholomew Light A. Saverio Curling RN, BA, [facility], CRRN [place]  [facility] Population Health RN Care Manager Direct Dial: [phone]  Fax: [phone]

## 2024-01-03 NOTE — Patient Instructions (Signed)
 Visit Information  Thank you for taking time to visit with me today. Please don't hesitate to contact me if I can be of assistance to you before our next scheduled telephone appointment.  Our next appointment is by telephone on 03/08/2024 at 2pm  Following is a copy of your care plan:   Goals Addressed             This Visit's Progress    VBCI RN Care Plan       Problems:  Chronic Disease Management support and education needs related to Osteoarthritis  Goal: Over the next 3 - 6 months the Patient will continue to work with RN Care Manager and/or Social Worker to address care management and care coordination needs related to Osteoarthritis as evidenced by adherence to care management team scheduled appointments      Interventions:   Falls Interventions: Reviewed medications and discussed potential side effects of medications such as dizziness and frequent urination Advised patient of importance of notifying provider of falls Assessed for signs and symptoms of orthostatic hypotension Assessed for falls since last encounter Assessed patients knowledge of fall risk prevention secondary to previously provided education Provided patient information for fall alert systems Screening for signs and symptoms of depression related to chronic disease state  Assessed social determinant of health barriers  Patient Self-Care Activities:  Attend all scheduled provider appointments Call pharmacy for medication refills 3-7 days in advance of running out of medications Call provider office for new concerns or questions  Perform all self care activities independently  Perform IADL's (shopping, preparing meals, housekeeping, managing finances) independently Take medications as prescribed   Continue home exercise program in preparation for 02/27/24 Right Total Knee Arthroplasty   Plan:  The patient has been provided with contact information for the care management team and has been advised to call  with any health related questions or concerns.              Patient verbalizes understanding of instructions and care plan provided today and agrees to view in MyChart. Active MyChart status and patient understanding of how to access instructions and care plan via MyChart confirmed with patient.     The patient has been provided with contact information for the care management team and has been advised to call with any health related questions or concerns.   Please call the care guide team at [phone] if you need to cancel or reschedule your appointment.   Please call 1-800-273-TALK (toll free, 24 hour hotline) if you are experiencing a Mental Health or Behavioral Health Crisis or need someone to talk to.  Jackilyn Umphlett A. Saverio Curling RN, BA, [facility], CRRN [place]  [facility] Population Health RN Care Manager Direct Dial: [phone]  Fax: [phone]

## 2024-01-08 DIAGNOSIS — M1711 Unilateral primary osteoarthritis, right knee: Secondary | ICD-10-CM | POA: Diagnosis not present

## 2024-01-15 ENCOUNTER — Other Ambulatory Visit: Payer: Self-pay | Admitting: *Deleted

## 2024-01-17 DIAGNOSIS — M1711 Unilateral primary osteoarthritis, right knee: Secondary | ICD-10-CM | POA: Diagnosis not present

## 2024-01-18 ENCOUNTER — Other Ambulatory Visit (HOSPITAL_COMMUNITY): Payer: Self-pay

## 2024-01-22 DIAGNOSIS — M1711 Unilateral primary osteoarthritis, right knee: Secondary | ICD-10-CM | POA: Diagnosis not present

## 2024-01-23 ENCOUNTER — Other Ambulatory Visit: Payer: Self-pay | Admitting: *Deleted

## 2024-01-23 ENCOUNTER — Other Ambulatory Visit: Payer: Self-pay | Admitting: Nurse Practitioner

## 2024-01-23 DIAGNOSIS — C210 Malignant neoplasm of anus, unspecified: Secondary | ICD-10-CM

## 2024-01-23 DIAGNOSIS — C772 Secondary and unspecified malignant neoplasm of intra-abdominal lymph nodes: Secondary | ICD-10-CM

## 2024-01-23 MED ORDER — LORAZEPAM 0.5 MG PO TABS: 0.5000 mg | ORAL_TABLET | Freq: Every day | ORAL | 0 refills | Status: DC | PRN

## 2024-01-23 NOTE — Telephone Encounter (Addendum)
 Call from [facility] requesting refill on lorazepam  0.5 mg. Noted she had taken this daily prn in the past. 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

## 2024-01-29 DIAGNOSIS — M1711 Unilateral primary osteoarthritis, right knee: Secondary | ICD-10-CM | POA: Diagnosis not present

## 2024-02-05 DIAGNOSIS — M1711 Unilateral primary osteoarthritis, right knee: Secondary | ICD-10-CM | POA: Diagnosis not present

## 2024-02-09 NOTE — Progress Notes (Signed)
 Surgery orders requested via Epic inbox.

## 2024-02-13 DIAGNOSIS — M1711 Unilateral primary osteoarthritis, right knee: Secondary | ICD-10-CM | POA: Diagnosis not present

## 2024-02-14 DIAGNOSIS — M1711 Unilateral primary osteoarthritis, right knee: Secondary | ICD-10-CM | POA: Diagnosis not present

## 2024-02-14 NOTE — Patient Instructions (Signed)
 SURGICAL WAITING ROOM VISITATION  Patients having surgery or a procedure may have no more than 2 support people in the waiting area - these visitors may rotate.    Children under the age of 93 must have an adult with them who is not the patient.  Due to an increase in RSV and influenza rates and associated hospitalizations, children ages 52 and under may not visit patients in [facility] hospitals.  Visitors with respiratory illnesses are discouraged from visiting and should remain at home.  If the patient needs to stay at the hospital during part of their recovery, the visitor guidelines for inpatient rooms apply. Pre-op nurse will coordinate an appropriate time for 1 support person to accompany patient in pre-op.  This support person may not rotate.    Please refer to the [facility] website for the visitor guidelines for Inpatients (after your surgery is over and you are in a regular room).       Your procedure is scheduled on: 02/27/24   Report to [facility] Main Entrance    Report to admitting at : 6:30 AM   Call this number if you have problems the morning of surgery [phone]   Do not eat food :After Midnight.   After Midnight you may have the following liquids until: 6:00 AM DAY OF SURGERY  Water Non-Citrus Juices (without pulp, NO RED-Apple, White grape, White cranberry) Black Coffee (NO MILK/CREAM OR CREAMERS, sugar ok)  Clear Tea (NO MILK/CREAM OR CREAMERS, sugar ok) regular and decaf                             Plain Jell-O (NO RED)                                           Fruit ices (not with fruit pulp, NO RED)                                     Popsicles (NO RED)                                                               Sports drinks like Gatorade (NO RED)   The day of surgery:  Drink ONE (1) Pre-Surgery Clear Ensure at : 6:00 AM the morning of surgery. Drink in one sitting. Do not sip.  This drink was given to you during your hospital  pre-op  appointment visit. Nothing else to drink after completing the  Pre-Surgery Clear Ensure or G2.          If you have questions, please contact your surgeon's office.  FOLLOW ANY ADDITIONAL PRE OP INSTRUCTIONS YOU RECEIVED FROM YOUR SURGEON'S OFFICE!!!   Oral Hygiene is also important to reduce your risk of infection.                                    Remember - BRUSH YOUR TEETH THE MORNING OF SURGERY WITH YOUR REGULAR TOOTHPASTE  DENTURES  WILL BE REMOVED PRIOR TO SURGERY PLEASE DO NOT APPLY "Poly grip" OR ADHESIVES!!!   Do NOT smoke after Midnight   Stop all vitamins and herbal supplements 7 days before surgery.   Take these medicines the morning of surgery with A SIP OF WATER: levothyroxine ,myrbetriq .Tylenol ,lorazepam . as needed. Eye drops as usual.                              You may not have any metal on your body including hair pins, jewelry, and body piercing             Do not wear make-up, lotions, powders, perfumes/cologne, or deodorant  Do not wear nail polish including gel and S&S, artificial/acrylic nails, or any other type of covering on natural nails including finger and toenails. If you have artificial nails, gel coating, etc. that needs to be removed by a nail salon please have this removed prior to surgery or surgery may need to be canceled/ delayed if the surgeon/ anesthesia feels like they are unable to be safely monitored.   Do not shave  48 hours prior to surgery.    Do not bring valuables to the hospital. [facility] IS NOT             RESPONSIBLE   FOR VALUABLES.   Contacts, glasses, dentures or bridgework may not be worn into surgery.   Bring small overnight bag day of surgery.   DO NOT BRING YOUR HOME MEDICATIONS TO THE HOSPITAL. PHARMACY WILL DISPENSE MEDICATIONS LISTED ON YOUR MEDICATION LIST TO YOU DURING YOUR ADMISSION IN THE HOSPITAL!    Patients discharged on the day of surgery will not be allowed to drive home.  Someone NEEDS to stay with you for  the first 24 hours after anesthesia.   Special Instructions: Bring a copy of your healthcare power of attorney and living will documents the day of surgery if you haven't scanned them before.              Please read over the following fact sheets you were given: IF YOU HAVE QUESTIONS ABOUT YOUR PRE-OP INSTRUCTIONS PLEASE CALL [phone]   If you received a COVID test during your pre-op visit  it is requested that you wear a mask when out in public, stay away from anyone that may not be feeling well and notify your surgeon if you develop symptoms. If you test positive for Covid or have been in contact with anyone that has tested positive in the last 10 days please notify you surgeon.      Pre-operative 5 CHG Bath Instructions   You can play a key role in reducing the risk of infection after surgery. Your skin needs to be as free of germs as possible. You can reduce the number of germs on your skin by washing with CHG (chlorhexidine gluconate) soap before surgery. CHG is an antiseptic soap that kills germs and continues to kill germs even after washing.   DO NOT use if you have an allergy to chlorhexidine/CHG or antibacterial soaps. If your skin becomes reddened or irritated, stop using the CHG and notify one of our RNs at [phone].   Please shower with the CHG soap starting 4 days before surgery using the following schedule:     Please keep in mind the following:  DO NOT shave, including legs and underarms, starting the day of your first shower.   You may shave your face at  any point before/day of surgery.  Place clean sheets on your bed the day you start using CHG soap. Use a clean washcloth (not used since being washed) for each shower. DO NOT sleep with pets once you start using the CHG.   CHG Shower Instructions:  If you choose to wash your hair and private area, wash first with your normal shampoo/soap.  After you use shampoo/soap, rinse your hair and body thoroughly to  remove shampoo/soap residue.  Turn the water OFF and apply about 3 tablespoons (45 ml) of CHG soap to a CLEAN washcloth.  Apply CHG soap ONLY FROM YOUR NECK DOWN TO YOUR TOES (washing for 3-5 minutes)  DO NOT use CHG soap on face, private areas, open wounds, or sores.  Pay special attention to the area where your surgery is being performed.  If you are having back surgery, having someone wash your back for you may be helpful. Wait 2 minutes after CHG soap is applied, then you may rinse off the CHG soap.  Pat dry with a clean towel  Put on clean clothes/pajamas   If you choose to wear lotion, please use ONLY the CHG-compatible lotions on the back of this paper.     Additional instructions for the day of surgery: DO NOT APPLY any lotions, deodorants, cologne, or perfumes.   Put on clean/comfortable clothes.  Brush your teeth.  Ask your nurse before applying any prescription medications to the skin.   CHG Compatible Lotions   Aveeno Moisturizing lotion  Cetaphil Moisturizing Cream  Cetaphil Moisturizing Lotion  Clairol Herbal Essence Moisturizing Lotion, Dry Skin  Clairol Herbal Essence Moisturizing Lotion, Extra Dry Skin  Clairol Herbal Essence Moisturizing Lotion, Normal Skin  Curel Age Defying Therapeutic Moisturizing Lotion with Alpha Hydroxy  Curel Extreme Care Body Lotion  Curel Soothing Hands Moisturizing Hand Lotion  Curel Therapeutic Moisturizing Cream, Fragrance-Free  Curel Therapeutic Moisturizing Lotion, Fragrance-Free  Curel Therapeutic Moisturizing Lotion, Original Formula  Eucerin Daily Replenishing Lotion  Eucerin Dry Skin Therapy Plus Alpha Hydroxy Crme  Eucerin Dry Skin Therapy Plus Alpha Hydroxy Lotion  Eucerin Original Crme  Eucerin Original Lotion  Eucerin Plus Crme Eucerin Plus Lotion  Eucerin TriLipid Replenishing Lotion  Keri Anti-Bacterial Hand Lotion  Keri Deep Conditioning Original Lotion Dry Skin Formula Softly Scented  Keri Deep Conditioning  Original Lotion, Fragrance Free Sensitive Skin Formula  Keri Lotion Fast Absorbing Fragrance Free Sensitive Skin Formula  Keri Lotion Fast Absorbing Softly Scented Dry Skin Formula  Keri Original Lotion  Keri Skin Renewal Lotion Keri Silky Smooth Lotion  Keri Silky Smooth Sensitive Skin Lotion  Nivea Body Creamy Conditioning Oil  Nivea Body Extra Enriched Lotion  Nivea Body Original Lotion  Nivea Body Sheer Moisturizing Lotion Nivea Crme  Nivea Skin Firming Lotion  NutraDerm 30 Skin Lotion  NutraDerm Skin Lotion  NutraDerm Therapeutic Skin Cream  NutraDerm Therapeutic Skin Lotion  ProShield Protective Hand Cream  Provon moisturizing lotion   Incentive Spirometer  An incentive spirometer is a tool that can help keep your lungs clear and active. This tool measures how well you are filling your lungs with each breath. Taking long deep breaths may help reverse or decrease the chance of developing breathing (pulmonary) problems (especially infection) following: A long period of time when you are unable to move or be active. BEFORE THE PROCEDURE  If the spirometer includes an indicator to show your best effort, your nurse or respiratory therapist will set it to a desired goal. If possible, sit up  straight or lean slightly forward. Try not to slouch. Hold the incentive spirometer in an upright position. INSTRUCTIONS FOR USE  Sit on the edge of your bed if possible, or sit up as far as you can in bed or on a chair. Hold the incentive spirometer in an upright position. Breathe out normally. Place the mouthpiece in your mouth and seal your lips tightly around it. Breathe in slowly and as deeply as possible, raising the piston or the ball toward the top of the column. Hold your breath for 3-5 seconds or for as long as possible. Allow the piston or ball to fall to the bottom of the column. Remove the mouthpiece from your mouth and breathe out normally. Rest for a few seconds and repeat Steps 1  through 7 at least 10 times every 1-2 hours when you are awake. Take your time and take a few normal breaths between deep breaths. The spirometer may include an indicator to show your best effort. Use the indicator as a goal to work toward during each repetition. After each set of 10 deep breaths, practice coughing to be sure your lungs are clear. If you have an incision (the cut made at the time of surgery), support your incision when coughing by placing a pillow or rolled up towels firmly against it. Once you are able to get out of bed, walk around indoors and cough well. You may stop using the incentive spirometer when instructed by your caregiver.  RISKS AND COMPLICATIONS Take your time so you do not get dizzy or light-headed. If you are in pain, you may need to take or ask for pain medication before doing incentive spirometry. It is harder to take a deep breath if you are having pain. AFTER USE Rest and breathe slowly and easily. It can be helpful to keep track of a log of your progress. Your caregiver can provide you with a simple table to help with this. If you are using the spirometer at home, follow these instructions: SEEK MEDICAL CARE IF:  You are having difficultly using the spirometer. You have trouble using the spirometer as often as instructed. Your pain medication is not giving enough relief while using the spirometer. You develop fever of 100.5 F (38.1 C) or higher. SEEK IMMEDIATE MEDICAL CARE IF:  You cough up bloody sputum that had not been present before. You develop fever of 102 F (38.9 C) or greater. You develop worsening pain at or near the incision site. MAKE SURE YOU:  Understand these instructions. Will watch your condition. Will get help right away if you are not doing well or get worse. Document Released: 01/16/2007 Document Revised: 11/28/2011 Document Reviewed: 03/19/2007 [id number] Patient Information 2014 [place],  [place].   ________________________________________________________________________

## 2024-02-15 ENCOUNTER — Encounter (HOSPITAL_COMMUNITY): Payer: Self-pay

## 2024-02-15 ENCOUNTER — Encounter (HOSPITAL_COMMUNITY)
Admission: RE | Admit: 2024-02-15 | Discharge: 2024-02-15 | Disposition: A | Discharge status: Home / Self Care | Source: Ambulatory Visit | Attending: Orthopedic Surgery | Admitting: Orthopedic Surgery

## 2024-02-15 ENCOUNTER — Other Ambulatory Visit: Payer: Self-pay

## 2024-02-15 VITALS — BP 117/69 | HR 71 | Temp 98.0°F | Ht 61.0 in | Wt 165.0 lb

## 2024-02-15 DIAGNOSIS — Z87891 Personal history of nicotine dependence: Secondary | ICD-10-CM | POA: Insufficient documentation

## 2024-02-15 DIAGNOSIS — Z9221 Personal history of antineoplastic chemotherapy: Secondary | ICD-10-CM | POA: Insufficient documentation

## 2024-02-15 DIAGNOSIS — Z923 Personal history of irradiation: Secondary | ICD-10-CM | POA: Diagnosis not present

## 2024-02-15 DIAGNOSIS — Z85048 Personal history of other malignant neoplasm of rectum, rectosigmoid junction, and anus: Secondary | ICD-10-CM | POA: Diagnosis not present

## 2024-02-15 DIAGNOSIS — I2510 Atherosclerotic heart disease of native coronary artery without angina pectoris: Secondary | ICD-10-CM | POA: Insufficient documentation

## 2024-02-15 DIAGNOSIS — M1711 Unilateral primary osteoarthritis, right knee: Secondary | ICD-10-CM | POA: Diagnosis not present

## 2024-02-15 DIAGNOSIS — Z01812 Encounter for preprocedural laboratory examination: Secondary | ICD-10-CM | POA: Diagnosis not present

## 2024-02-15 DIAGNOSIS — Z01818 Encounter for other preprocedural examination: Secondary | ICD-10-CM

## 2024-02-15 DIAGNOSIS — E039 Hypothyroidism, unspecified: Secondary | ICD-10-CM | POA: Diagnosis not present

## 2024-02-15 LAB — BASIC METABOLIC PANEL WITH GFR
Anion gap: 4 — ABNORMAL LOW (ref 5–15)
BUN: 22 mg/dL (ref 8–23)
CO2: 27 mmol/L (ref 22–32)
Calcium: 9 mg/dL (ref 8.9–10.3)
Chloride: 99 mmol/L (ref 98–111)
Creatinine, Ser: 0.8 mg/dL (ref 0.44–1.00)
GFR, Estimated: >60 mL/min (ref >60)
Glucose, Bld: 82 mg/dL (ref 70–99)
Potassium: 4.3 mmol/L (ref 3.5–5.1)
Sodium: 130 mmol/L — ABNORMAL LOW (ref 135–145)

## 2024-02-15 LAB — CBC
HCT: 37.2 % (ref 36.0–46.0)
Hemoglobin: 11.8 g/dL — ABNORMAL LOW (ref 12.0–15.0)
MCH: 29.3 pg (ref 26.0–34.0)
MCHC: 31.7 g/dL (ref 30.0–36.0)
MCV: 92.3 fL (ref 80.0–100.0)
Platelets: 222 10*3/uL (ref 150–400)
RBC: 4.03 MIL/uL (ref 3.87–5.11)
RDW: 14.7 % (ref 11.5–15.5)
WBC: 4.6 10*3/uL (ref 4.0–10.5)
nRBC: 0 % (ref 0.0–0.2)

## 2024-02-15 LAB — SURGICAL PCR SCREEN
MRSA, PCR: NEGATIVE
Staphylococcus aureus: NEGATIVE

## 2024-02-15 NOTE — Progress Notes (Signed)
 For Anesthesia: PCP - Ethel Henry Joaquim Muir, MD . Cardiologist - Nahser, Lela Purple, MD  LOV: 03/29/22 [id number], Geralyn Knee T, PA-C : 04/05/23 Bowel Prep reminder:  Chest x-ray -  EKG - 04/05/23 Stress Test -  ECHO -  Cardiac Cath -  Pacemaker/ICD device last checked: Pacemaker orders received: Device Rep notified:  Spinal Cord Stimulator:  Sleep Study - N/A CPAP -   Fasting Blood Sugar - N/A Checks Blood Sugar _____ times a day Date and result of last Hgb A1c-  Last dose of GLP1 agonist- N/A GLP1 instructions:   Last dose of SGLT-2 inhibitors- N/A SGLT-2 instructions:   Blood Thinner Instructions:N/A Aspirin  Instructions: Last Dose:  Activity level: Can go up a flight of stairs and activities of daily living without stopping and without chest pain and/or shortness of breath      Unable to go up a flight of stairs due to knee pain.    Anesthesia review: Coronary Artery Calcification ,chest pain   Patient denies shortness of breath, fever, cough and chest pain at PAT appointment   Patient verbalized understanding of instructions that were given to them at the PAT appointment. Patient was also instructed that they will need to review over the PAT instructions again at home before surgery.

## 2024-02-16 ENCOUNTER — Encounter (HOSPITAL_COMMUNITY): Payer: Self-pay

## 2024-02-16 NOTE — Progress Notes (Signed)
 Case: [id number] Date/Time: 02/27/24 0845   Procedure: ARTHROPLASTY, KNEE, TOTAL (Right: Knee)   Anesthesia type: Spinal   Diagnosis: Primary osteoarthritis of right knee [M17.11]   Pre-op diagnosis: right knee OSTEOARTHRITIS   Location[facility] ROOM 08 / [facility]   Surgeons: Osa Blase, MD       DISCUSSION: Ashley Moore is a 73 yo female who presents to PAT prior to surgery above. PMH of former smoking, CAD (by CT), hypothyroidism, hx of metastatic anal cancer s/p chemo/XRT (2018), right hydronephrosis s/p chronic stenting, arthritis  Patient follows with oncology for metastatic anal cancer. She was last seen on 11/24/23. She is currently in clinical remission and undergoing surveillance CT scans. Last CT on 02/07/23 showed stable disease. Oncology clearance signed (scanned in media on 12/04/23).  Patient follows with Cardiology for CAD by CT. Last seen on 04/05/23. She was recommended to control her cholesterol levels which have been in normal range off statin therapy. Advised to f/u in 1 year.  PCP clearance?  VS: BP 117/69   Pulse 71   Temp 36.7 C (Oral)   Ht 5\' 1"  (1.549 m)   Wt 74.8 kg   SpO2 100%   BMI 31.18 kg/m   PROVIDERS: Panosh, Joaquim Muir, MD   LABS: Labs reviewed: Acceptable for surgery. Mild-mod hyponatremia. (all labs ordered are listed, but only abnormal results are displayed)  Labs Reviewed  CBC - Abnormal; Notable for the following components:      Result Value   Hemoglobin 11.8 (*)    All other components within normal limits  BASIC METABOLIC PANEL WITH GFR - Abnormal; Notable for the following components:   Sodium 130 (*)    Anion gap 4 (*)    All other components within normal limits  SURGICAL PCR SCREEN     IMAGES:  IMPRESSION: 1. Slightly diminished ill-defined hypodense lesion within the medial aspect of the right psoas musculature measuring 3.3 x 2.0 cm consistent with a treated soft tissue metastasis. 2. Unchanged circumferential wall  thickening of the low rectum and anus. 3. Severe right hydronephrosis with severe right renal cortical atrophy and obstruction of the proximal right ureter by above-described soft tissue mass, unchanged. 4. No other evidence of lymphadenopathy or metastatic disease in the chest, abdomen, or pelvis. 5. Unchanged mild bibasilar fibrotic septal thickening and traction bronchiectasis, consistent with mild pulmonary fibrosis. This could be further characterized by pulmonary referral and dedicated interstitial lung disease examination of the chest if clinically appropriate in the setting of known malignancy. 6. Unchanged mild intra and extrahepatic biliary ductal dilatation. Common bile duct measures up to 0.9 cm without visible calculi or other obstructing etiology identified to the ampulla. As previously reported, this could be further evaluated by MRI if there is clinical concern for biliary obstruction. 7. Unchanged diffuse urinary bladder wall thickening, suggestive of nonspecific infectious or inflammatory cystitis, possibly related to pelvic radiation therapy. Correlate with urinalysis. 8. Coronary artery disease.   Aortic Atherosclerosis (ICD10-I70.0).   Past Medical History:  Diagnosis Date   Arthritis    Bowen's disease    excised 1992   Chronic low back pain    Chronic radiation cystitis    DDD (degenerative disc disease), cervical    Family history of breast cancer    Family history of lung cancer    Family history of non-Hodgkin's lymphoma    Family history of ovarian cancer    Headache    Hx of varicella    Hydronephrosis of right kidney  urologist--- dr Secundino Dach,  malignant,  treated with ureter stent   Hypothyroidism    followed by pcp   Lower urinary tract symptoms (LUTS)    01-12-2021  per pt treated with accupuncture at dr Secundino Dach office   Lymphedema of both lower extremities    PICC (peripherally inserted central catheter) in place 01/11/2021   placed at  [facility] in [place], [place] for IV antibiotics   Positive blood culture 01/08/2021   Klebsiella Pneumaniae,  treated with daily IV antibiotic   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[facility]) oncologist--- dr Scherrie Curt   dx 02/ 2018,  invasive SCC , completed chemo/ radiation 12-23-2016;  recurrent metastatsis retroperitoneal lymphdenopathy,  completed radiation 04-13-2020 residual right ureteral uropathy obstruction   Retroperitoneal lymphadenopathy    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   Sepsis due to Klebsiella pneumoniae (HCC) 01/07/2021   pt admitted to [facility] in [place],  dx sepsis secondary to acute pyelonephritis with bacteremia , positive blood culture,  discharged 01-11-2021 home daily IV antibiotic   Wears glasses     Past Surgical History:  Procedure Laterality Date   BREAST BIOPSY Right    BUNIONECTOMY Right yrs ago   CYSTOSCOPY W/ URETERAL STENT PLACEMENT Right 06/12/2020   Procedure: CYSTOSCOPY WITH RETROGRADE PYELOGRAM/URETERAL STENT EXCHANGE;  Surgeon: Osborn Blaze, MD;  Location: [facility];  Service: Urology;  Laterality: Right;   CYSTOSCOPY W/ URETERAL STENT PLACEMENT Right 01/13/2021   Procedure: CYSTOSCOPY WITH RETROGRADE PYELOGRAM/URETERAL STENT REMOVALRIGHT;  Surgeon: Osborn Blaze, MD;  [facility];  Service: Urology;  Laterality: Right;  45 MINS   CYSTOSCOPY WITH RETROGRADE PYELOGRAM, URETEROSCOPY AND STENT PLACEMENT Right 01/28/2020   Procedure: CYSTOSCOPY WITH RETROGRADE PYELOGRAM, URETEROSCOPY AND STENT PLACEMENT;  Surgeon: Osborn Blaze, MD;  [facility];  Service: Urology;  Laterality: Right;   EYE SURGERY Bilateral    ptosis   IR GENERIC HISTORICAL  11/14/2016   IR US  GUIDE VASC ACCESS RIGHT 11/14/2016 [facility]   IR GENERIC HISTORICAL  11/14/2016   IR FLUORO GUIDE CV LINE RIGHT 11/14/2016 [facility]   IR GENERIC HISTORICAL  12/12/2016   IR US  GUIDE VASC ACCESS  RIGHT 12/12/2016 Robbi Childs, MD [facility]   IR GENERIC HISTORICAL  12/12/2016   IR FLUORO GUIDE CV LINE RIGHT 12/12/2016 Robbi Childs, MD [facility]   SKIN CANCER EXCISION  1990   bowens disease    MEDICATIONS:  acetaminophen  (TYLENOL ) 650 MG CR tablet   azelastine  (ASTELIN ) 0.1 % nasal spray   castor oil liquid   Cholecalciferol (VITAMIN D3 PO)   Collagen-Vitamin C-Biotin (COLLAGEN PO)   conjugated estrogens  (PREMARIN ) vaginal cream   CRANBERRY PO   levothyroxine  (SYNTHROID ) 75 MCG tablet   LORazepam  (ATIVAN ) 0.5 MG tablet   Misc Natural Products (TURMERIC CURCUMIN) CAPS   Multiple Minerals-Vitamins (CAL MAG ZINC +D3 PO)   MYRBETRIQ  50 MG TB24 tablet   naproxen sodium (ALEVE) 220 MG tablet   olive oil external oil   Olopatadine HCl (PATADAY) 0.2 % SOLN   Polyethyl Glyc-Propyl Glyc PF (SYSTANE HYDRATION PF) 0.4-0.3 % SOLN   Prenatal Vit-Fe Fumarate-FA (PRENATAL PO)   No current facility-administered medications for this encounter.

## 2024-02-19 NOTE — H&P (Signed)
 KNEE ARTHROPLASTY ADMISSION H&P  Patient ID: Ashley Moore MRN: [id number] DOB/AGE: [birth year] 73 y.o.  Chief Complaint: right knee pain.  Planned Procedure Date: 02/27/24 Medical Clearance by Dr. Ethel Henry   Additional clearance by Dr. Scherrie Curt oncology  HPI: Ashley Moore is a 73 y.o. female who presents for evaluation of right knee OSTEOARTHRITIS. The patient has a history of pain and functional disability in the right knee due to arthritis and has failed non-surgical conservative treatments for greater than 12 weeks to include NSAID's and/or analgesics, viscosupplementation injections, and activity modification.  Onset of symptoms was abrupt, starting 4 years ago with rapidlly worsening course since that time. The patient noted no past surgery on the right knee.  Patient currently rates pain at 9 out of 10 with activity. Patient has worsening of pain with activity and weight bearing and pain that interferes with activities of daily living.  Patient has evidence of joint space narrowing by imaging studies.  There is no active infection.  Past Medical History:  Diagnosis Date   Arthritis    Bowen's disease    excised 1992   Chronic low back pain    Chronic radiation cystitis    DDD (degenerative disc disease), cervical    Family history of breast cancer    Family history of lung cancer    Family history of non-Hodgkin's lymphoma    Family history of ovarian cancer    Headache    Hx of varicella    Hydronephrosis of right kidney    urologist--- dr Secundino Dach,  malignant,  treated with ureter stent   Hypothyroidism    followed by pcp   Lower urinary tract symptoms (LUTS)    01-12-2021  per pt treated with accupuncture at dr Secundino Dach office   Lymphedema of both lower extremities    PICC (peripherally inserted central catheter) in place 01/11/2021   placed at [facility] in [place], [place] for IV antibiotics   Positive blood culture 01/08/2021   Klebsiella Pneumaniae,   treated with daily IV antibiotic   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[facility]) oncologist--- dr Scherrie Curt   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,  invasive SCC , completed chemo/ radiation 12-23-2016;  recurrent metastatsis retroperitoneal lymphdenopathy,  completed radiation 04-13-2020 residual right ureteral uropathy obstruction   Retroperitoneal lymphadenopathy    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   Sepsis due to Klebsiella pneumoniae (HCC) 01/07/2021   pt admitted to [facility] in [place],  dx sepsis secondary to acute pyelonephritis with bacteremia , positive blood culture,  discharged 01-11-2021 home daily IV antibiotic   Wears glasses    Past Surgical History:  Procedure Laterality Date   BREAST BIOPSY Right    BUNIONECTOMY Right yrs ago   CYSTOSCOPY W/ URETERAL STENT PLACEMENT Right 06/12/2020   Procedure: CYSTOSCOPY WITH RETROGRADE PYELOGRAM/URETERAL STENT EXCHANGE;  Surgeon: Osborn Blaze, MD;  Location: [facility];  Service: Urology;  Laterality: Right;   CYSTOSCOPY W/ URETERAL STENT PLACEMENT Right 01/13/2021   Procedure: CYSTOSCOPY WITH RETROGRADE PYELOGRAM/URETERAL STENT REMOVALRIGHT;  Surgeon: Osborn Blaze, MD;  Location: [facility];  Service: Urology;  Laterality: Right;  45 MINS   CYSTOSCOPY WITH RETROGRADE PYELOGRAM, URETEROSCOPY AND STENT PLACEMENT Right 01/28/2020   Procedure: CYSTOSCOPY WITH RETROGRADE PYELOGRAM, URETEROSCOPY AND STENT PLACEMENT;  Surgeon: Osborn Blaze, MD;  Location: [facility];  Service: Urology;  Laterality: Right;   EYE SURGERY Bilateral    ptosis   IR GENERIC HISTORICAL  11/14/2016   IR  US  GUIDE VASC ACCESS RIGHT 11/14/2016 [facility]   IR GENERIC HISTORICAL  11/14/2016   IR FLUORO GUIDE CV LINE RIGHT 11/14/2016 [facility]   IR GENERIC HISTORICAL  12/12/2016   IR US  GUIDE VASC ACCESS RIGHT 12/12/2016 Robbi Childs, MD [facility]   IR GENERIC HISTORICAL  12/12/2016   IR FLUORO GUIDE CV LINE RIGHT  12/12/2016 Robbi Childs, MD [facility]   SKIN CANCER EXCISION  1990   bowens disease   Allergies  Allergen Reactions   Sulfamethoxazole -Trimethoprim  Other (See Comments)    C/o  abd pain and constipation   Benadryl [Diphenhydramine] Other (See Comments)    Tingle all over    Melatonin     Tingle all over    Penicillins Rash    Did it involve swelling of the face/tongue/throat, SOB, or low BP? N Did it involve sudden or severe rash/hives, skin peeling, or any reaction on the inside of your mouth or nose? Y Did you need to seek medical attention at a hospital or doctor's office? N When did it last happen? Almost 50 years Ago      If all above answers are "NO", may proceed with cephalosporin use.    Prior to Admission medications   Medication Sig Start Date End Date Taking? Authorizing Provider  acetaminophen  (TYLENOL ) 650 MG CR tablet Take 1,300 mg by mouth 2 (two) times daily.   Yes [provider]  castor oil liquid Take 1 mL by mouth daily. Applied to eyelids   Yes [provider]  Cholecalciferol (VITAMIN D3 PO) Take 1 tablet by mouth daily.   Yes [provider]  Collagen-Vitamin C-Biotin (COLLAGEN PO) Take 1 Scoop by mouth in the morning. Gundry MD Phyto Collagen Complex   Yes [provider]  conjugated estrogens  (PREMARIN ) vaginal cream Place 1 Applicatorful vaginally at bedtime.   Yes [provider]  CRANBERRY PO Take 1 capsule by mouth daily. Organic Cranberry Concentrate by Pure [employer]   Yes [provider]  levothyroxine  (SYNTHROID ) 75 MCG tablet TAKE 1 TABLET EVERY DAY 10/31/23  Yes Panosh, Wanda K, MD  LORazepam  (ATIVAN ) 0.5 MG tablet Take 1 tablet (0.5 mg total) by mouth daily as needed for anxiety (as needed). 01/23/24  Yes Roseline Conine, NP  Misc Natural Products (TURMERIC CURCUMIN) CAPS Take 1 capsule by mouth daily.   Yes [provider]  Multiple Minerals-Vitamins (CAL MAG ZINC +D3 PO) Take 1 tablet by mouth  daily.   Yes [provider]  MYRBETRIQ  50 MG TB24 tablet Take 50 mg by mouth in the morning. 01/19/21  Yes [provider]  naproxen sodium (ALEVE) 220 MG tablet Take 220 mg by mouth 2 (two) times daily as needed (pain.).   Yes [provider]  olive oil external oil 1 oz daily. By mouth for constipation.   Yes [provider]  Olopatadine HCl (PATADAY) 0.2 % SOLN Place 1 drop into both eyes in the morning.   Yes [provider]  Polyethyl Glyc-Propyl Glyc PF (SYSTANE HYDRATION PF) 0.4-0.3 % SOLN Apply to eye.   Yes [provider]  Prenatal Vit-Fe Fumarate-FA (PRENATAL PO) Take 1 capsule by mouth daily. [employer] Prenatal Vitamin   Yes [provider]  azelastine  (ASTELIN ) 0.1 % nasal spray Place 2 sprays into both nostrils 2 (two) times daily. 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, museum  Tobacco Use   Smoking status: Former    Current packs/day: 0.00    Average packs/day: 1 pack/day for 25.0 years (25.0 ttl pk-yrs)    Types: Cigarettes    Start date: 06/19/1970    Quit date: 06/20/1995    Years since quitting: 28.6   Smokeless tobacco: Never  Vaping Use   Vaping status: Never Used  Substance and Sexual Activity   Alcohol use: Not Currently    Alcohol/week: 0.0 standard drinks of alcohol    Comment: occasional wine   Drug use: Yes    Types: Marijuana    Comment: 01-12-2021  per pt once a week   Sexual activity: Not on file  Other Topics Concern   Not on file  Social History Narrative   H H  of 1      5 pets.   She is a former smoker   Retired  Control and instrumentation engineer; Scientist, physiological   etoh   Red wine  1 per night.    Tea green tea and earl gray    Moved from [place] to [place] area in Mar 22, 1984   1 pregnancy   Husband died spring  2016 cv   Sister died 03-23-15 Bone cancer    3 remaining sisters            Social Drivers of Corporate investment banker Strain: Low Risk  (06/26/2023)   Overall Financial Resource Strain (CARDIA)    Difficulty of Paying Living Expenses: Not hard at all  Food Insecurity: No Food Insecurity (01/03/2024)   Hunger Vital Sign    Worried About Running Out of Food in the Last Year: Never true    Ran Out of Food in the Last Year: Never true  Transportation Needs: No Transportation Needs (01/03/2024)   PRAPARE - Administrator, Civil Service (Medical): No    Lack of Transportation (Non-Medical): No  Physical Activity: Insufficiently Active (06/26/2023)   Exercise Vital Sign    Days of Exercise per Week: 6 days    Minutes of Exercise per Session: 20 min  Stress: Patient Declined (06/26/2023)   [employer] of Occupational Health - Occupational Stress Questionnaire    Feeling of Stress : Patient declined  Social Connections: Moderately Integrated (06/26/2023)   Social Connection and Isolation Panel [NHANES]    Frequency of Communication with Friends and Family: Patient declined    Frequency of Social Gatherings with Friends and Family: More than three times a week    Attends Religious Services: More than 4 times per year    Active Member of [employer] or Organizations: Yes    Attends Banker Meetings: More than 4 times per year    Marital Status: Widowed   Family History  Problem Relation Age of Onset   Lung cancer Mother        lung   Breast cancer Mother 110   Ovarian cancer Mother 2   Pancreatitis Father    Breast cancer Sister 74   Breast cancer Maternal Grandmother 83       breast    Breast cancer Maternal Aunt 67  breast   Melanoma Sister    Breast cancer Sister 79   Non-Hodgkin's lymphoma Paternal Grandmother     ROS: Currently denies lightheadedness, dizziness, Fever, chills, CP, SOB.   No personal history of DVT, PE, MI, or CVA. No loose teeth or dentures All other systems have been reviewed and were otherwise currently negative with the exception of those mentioned in the HPI and as above.  Objective: Vitals: Ht: 5\' 1"  Wt: 167 lbs Temp: 97.8 BP: 129/75 Pulse: 77 O2 99% on room air.   Physical Exam: General: Alert, NAD.  Antalgic Gait  HEENT: EOMI, Good Neck Extension  Pulm: No increased work of breathing.  Clear B/L A/P w/o crackle or wheeze.  CV: RRR, No m/g/r appreciated  GI: soft, NT, ND Neuro: Neuro without gross focal deficit.  Sensation intact distally Skin: No lesions in the area of chief complaint MSK/Surgical Site: -10-120 deg ROM. right knee w/o redness or effusion.  Medial and lateral JLT.Aaron Aas  5/5 strength in extension and flexion.  +EHL/FHL.  NVI.  Severe valgus deformity   Imaging Review Plain radiographs demonstrate severe degenerative joint disease of the right knee.   Preoperative templating of the joint replacement has been completed, documented, and submitted to the Operating Room personnel in order to optimize intra-operative equipment management.  Assessment: right knee OSTEOARTHRITIS Active Problems:   * No active hospital problems. *   Plan: Plan for Procedure(s): ARTHROPLASTY, KNEE, TOTAL  The patient history, physical exam, clinical judgement of the provider and imaging are consistent with end stage degenerative joint disease and total joint arthroplasty is deemed medically necessary. The treatment options including medical management, injection therapy, and arthroplasty were discussed at length. The risks and benefits of Procedure(s): ARTHROPLASTY, KNEE, TOTAL were presented and reviewed.  The risks of nonoperative treatment, versus surgical intervention  including but not limited to continued pain, aseptic loosening, stiffness, dislocation/subluxation, infection, bleeding, nerve injury, blood clots, cardiopulmonary complications, morbidity, mortality, among others were discussed. The patient verbalizes understanding and wishes to proceed with the plan.  Patient is being admitted for inpatient treatment for surgery, pain control, PT, prophylactic antibiotics, VTE prophylaxis, progressive ambulation, ADL's and discharge planning.   The patient does meet the criteria for TXA which will be used perioperatively.   Eliquis 2.5 BID will be used postoperatively for DVT prophylaxis in addition to SCDs, and early ambulation. The patient is planning to be discharged home with HHPT in care of family   Dawood Spitler K Mattison Stuckey, PA-C 02/19/2024 3:53 PM

## 2024-02-20 ENCOUNTER — Telehealth: Payer: Self-pay | Admitting: Oncology

## 2024-02-20 ENCOUNTER — Telehealth: Payer: Self-pay | Admitting: *Deleted

## 2024-02-20 NOTE — Telephone Encounter (Signed)
 Called Ashley Moore with date/time of her CT scan and labs on 05/06/24. She is aware of the scan review appointment already.

## 2024-02-20 NOTE — Telephone Encounter (Signed)
 PER INBASKET MESSAGE. APPT CONFIRMED.

## 2024-02-21 ENCOUNTER — Other Ambulatory Visit: Payer: Self-pay

## 2024-02-21 NOTE — Anesthesia Preprocedure Evaluation (Addendum)
 Anesthesia Evaluation  Patient identified by MRN, date of birth, ID band Patient awake    Reviewed: Allergy & Precautions, NPO status , Patient's Chart, lab work & pertinent test results  History of Anesthesia Complications Negative for: history of anesthetic complications  Airway Mallampati: II  TM Distance: >3 FB Neck ROM: Full    Dental no notable dental hx.    Pulmonary Patient abstained from smoking., former smoker   Pulmonary exam normal        Cardiovascular + CAD  Normal cardiovascular exam     Neuro/Psych  Headaches    GI/Hepatic negative GI ROS, Neg liver ROS,,,  Endo/Other  Hypothyroidism    Renal/GU negative Renal ROS  negative genitourinary   Musculoskeletal  (+) Arthritis ,    Abdominal   Peds  Hematology  (+) Blood dyscrasia (Hgb 11.8), anemia   Anesthesia Other Findings Day of surgery medications reviewed with patient.  Reproductive/Obstetrics negative OB ROS                             Anesthesia Physical Anesthesia Plan  ASA: 2  Anesthesia Plan: Spinal   Post-op Pain Management: Tylenol  PO (pre-op)*   Induction:   PONV Risk Score and Plan: 2 and Treatment may vary due to age or medical condition, Ondansetron , Dexamethasone , Midazolam  and Propofol  infusion  Airway Management Planned: Natural Airway and Simple Face Mask  Additional Equipment: None  Intra-op Plan:   Post-operative Plan: Extubation in OR  Informed Consent: I have reviewed the patients History and Physical, chart, labs and discussed the procedure including the risks, benefits and alternatives for the proposed anesthesia with the patient or authorized representative who has indicated his/her understanding and acceptance.       Plan Discussed with: CRNA  Anesthesia Plan Comments: (See PAT note from 5/29)        Anesthesia Quick Evaluation

## 2024-02-22 NOTE — Care Plan (Signed)
 Ortho Bundle Case Management Note  Patient Details  Name: Ashley Moore MRN: [id number] Date of Birth: 11/04/1950  patient met with PA in the office for H&P. she will discharge to home with family to assist. has DME. HHPT referral to [facility]. OPPT set up with [facility]. discharge instructions discussed and packet given. CM spoke with patient today. questions answered. Patient and MD in agreement with plan. Choice offered.                    DME Arranged:    DME Agency:     HH Arranged:  PT HH Agency:  [facility]  Additional Comments: Please contact me with any questions of if this plan should need to change.  Cornelia Dieter,  RN,BSN,MHA,CCM  [facility] Orthopaedic Specialist  [phone] 02/22/2024, 4:11 PM

## 2024-02-26 ENCOUNTER — Encounter (HOSPITAL_COMMUNITY): Payer: Self-pay | Admitting: Orthopedic Surgery

## 2024-02-27 ENCOUNTER — Ambulatory Visit (HOSPITAL_COMMUNITY)

## 2024-02-27 ENCOUNTER — Ambulatory Visit (HOSPITAL_COMMUNITY): Payer: Self-pay | Admitting: Medical

## 2024-02-27 ENCOUNTER — Encounter (HOSPITAL_COMMUNITY)
Admission: RE | Disposition: A | Discharge status: Home / Self Care | Payer: Self-pay | Source: Ambulatory Visit | Attending: Orthopedic Surgery

## 2024-02-27 ENCOUNTER — Ambulatory Visit (HOSPITAL_COMMUNITY): Payer: Self-pay | Admitting: Anesthesiology

## 2024-02-27 ENCOUNTER — Encounter (HOSPITAL_COMMUNITY): Payer: Self-pay | Admitting: Orthopedic Surgery

## 2024-02-27 ENCOUNTER — Other Ambulatory Visit: Payer: Self-pay

## 2024-02-27 ENCOUNTER — Observation Stay (HOSPITAL_COMMUNITY)
Admission: RE | Admit: 2024-02-27 | Discharge: 2024-02-28 | Disposition: A | Discharge status: Home / Self Care | Source: Ambulatory Visit | Attending: Orthopedic Surgery | Admitting: Orthopedic Surgery

## 2024-02-27 DIAGNOSIS — E039 Hypothyroidism, unspecified: Secondary | ICD-10-CM | POA: Diagnosis not present

## 2024-02-27 DIAGNOSIS — Z471 Aftercare following joint replacement surgery: Secondary | ICD-10-CM | POA: Diagnosis not present

## 2024-02-27 DIAGNOSIS — Z79899 Other long term (current) drug therapy: Secondary | ICD-10-CM | POA: Diagnosis not present

## 2024-02-27 DIAGNOSIS — M25461 Effusion, right knee: Secondary | ICD-10-CM | POA: Diagnosis not present

## 2024-02-27 DIAGNOSIS — G8918 Other acute postprocedural pain: Secondary | ICD-10-CM | POA: Diagnosis not present

## 2024-02-27 DIAGNOSIS — Z85048 Personal history of other malignant neoplasm of rectum, rectosigmoid junction, and anus: Secondary | ICD-10-CM | POA: Insufficient documentation

## 2024-02-27 DIAGNOSIS — M85861 Other specified disorders of bone density and structure, right lower leg: Secondary | ICD-10-CM | POA: Diagnosis not present

## 2024-02-27 DIAGNOSIS — Z87891 Personal history of nicotine dependence: Secondary | ICD-10-CM | POA: Diagnosis not present

## 2024-02-27 DIAGNOSIS — M1711 Unilateral primary osteoarthritis, right knee: Principal | ICD-10-CM | POA: Insufficient documentation

## 2024-02-27 DIAGNOSIS — Z96651 Presence of right artificial knee joint: Secondary | ICD-10-CM | POA: Diagnosis not present

## 2024-02-27 SURGERY — ARTHROPLASTY, KNEE, TOTAL
Anesthesia: Spinal | Site: Knee | Laterality: Right

## 2024-02-27 MED ORDER — OXYCODONE HCL 5 MG/5ML PO SOLN: 5.0000 mg | Freq: Once | ORAL | Status: AC | PRN

## 2024-02-27 MED ORDER — FENTANYL CITRATE PF 50 MCG/ML IJ SOSY
50.0000 ug | PREFILLED_SYRINGE | INTRAMUSCULAR | Status: AC
  Administered 2024-02-27: 50 ug via INTRAVENOUS
  Filled 2024-02-27: qty 2

## 2024-02-27 MED ORDER — FENTANYL CITRATE PF 50 MCG/ML IJ SOSY
25.0000 ug | PREFILLED_SYRINGE | INTRAMUSCULAR | Status: DC | PRN
  Administered 2024-02-27: 50 ug via INTRAVENOUS

## 2024-02-27 MED ORDER — POLYVINYL ALCOHOL 1.4 % OP SOLN: 1.0000 [drp] | Freq: Three times a day (TID) | OPHTHALMIC | Status: DC | PRN

## 2024-02-27 MED ORDER — PROPOFOL 1000 MG/100ML IV EMUL
INTRAVENOUS | Status: AC
Start: 2024-02-27 — End: ?
  Filled 2024-02-27: qty 100

## 2024-02-27 MED ORDER — CEFAZOLIN SODIUM-DEXTROSE 2-4 GM/100ML-% IV SOLN
2.0000 g | Freq: Four times a day (QID) | INTRAVENOUS | Status: AC
  Administered 2024-02-27 (×2): 2 g via INTRAVENOUS
  Filled 2024-02-27: qty 100

## 2024-02-27 MED ORDER — DEXAMETHASONE SODIUM PHOSPHATE 10 MG/ML IJ SOLN
INTRAMUSCULAR | Status: AC
  Filled 2024-02-27: qty 1

## 2024-02-27 MED ORDER — MAGNESIUM CITRATE PO SOLN: 1.0000 | Freq: Once | ORAL | Status: DC | PRN

## 2024-02-27 MED ORDER — LACTATED RINGERS IV BOLUS
250.0000 mL | Freq: Once | INTRAVENOUS | Status: AC
  Administered 2024-02-27: 250 mL via INTRAVENOUS

## 2024-02-27 MED ORDER — OXYCODONE HCL 5 MG PO TABS
ORAL_TABLET | ORAL | Status: AC
  Filled 2024-02-27: qty 1

## 2024-02-27 MED ORDER — SODIUM CHLORIDE 0.9 % IR SOLN
Status: DC | PRN
Start: 2024-02-27 — End: 2024-02-27
  Administered 2024-02-27: 1000 mL

## 2024-02-27 MED ORDER — METHOCARBAMOL 500 MG PO TABS: 500.0000 mg | ORAL_TABLET | Freq: Three times a day (TID) | ORAL | 0 refills | Status: DC | PRN

## 2024-02-27 MED ORDER — KETOROLAC TROMETHAMINE 30 MG/ML IJ SOLN
INTRAMUSCULAR | Status: AC
  Filled 2024-02-27: qty 1

## 2024-02-27 MED ORDER — TRANEXAMIC ACID-NACL 1000-0.7 MG/100ML-% IV SOLN
INTRAVENOUS | Status: AC
  Filled 2024-02-27: qty 100

## 2024-02-27 MED ORDER — ACETAMINOPHEN 500 MG PO TABS
1000.0000 mg | ORAL_TABLET | Freq: Four times a day (QID) | ORAL | Status: AC
  Administered 2024-02-27 – 2024-02-28 (×4): 1000 mg via ORAL
  Filled 2024-02-27 (×4): qty 2

## 2024-02-27 MED ORDER — OLOPATADINE HCL 0.1 % OP SOLN
1.0000 [drp] | Freq: Two times a day (BID) | OPHTHALMIC | Status: DC
  Filled 2024-02-27: qty 5

## 2024-02-27 MED ORDER — HYDROMORPHONE HCL 1 MG/ML IJ SOLN
0.5000 mg | INTRAMUSCULAR | Status: DC | PRN
  Administered 2024-02-27: 0.5 mg via INTRAVENOUS
  Filled 2024-02-27: qty 1

## 2024-02-27 MED ORDER — ORAL CARE MOUTH RINSE: 15.0000 mL | Freq: Once | OROMUCOSAL | Status: AC

## 2024-02-27 MED ORDER — APIXABAN 2.5 MG PO TABS: 2.5000 mg | ORAL_TABLET | Freq: Two times a day (BID) | ORAL | 0 refills | Status: DC

## 2024-02-27 MED ORDER — ACETAMINOPHEN 500 MG PO TABS
1000.0000 mg | ORAL_TABLET | Freq: Once | ORAL | Status: DC
  Filled 2024-02-27: qty 2

## 2024-02-27 MED ORDER — DEXAMETHASONE SODIUM PHOSPHATE 10 MG/ML IJ SOLN
INTRAMUSCULAR | Status: DC | PRN
  Administered 2024-02-27: 10 mg via INTRAVENOUS

## 2024-02-27 MED ORDER — LACTATED RINGERS IV BOLUS: 500.0000 mL | Freq: Once | INTRAVENOUS | Status: DC

## 2024-02-27 MED ORDER — METOCLOPRAMIDE HCL 5 MG/ML IJ SOLN
5.0000 mg | Freq: Three times a day (TID) | INTRAMUSCULAR | Status: DC | PRN
  Administered 2024-02-27: 10 mg via INTRAVENOUS
  Filled 2024-02-27: qty 2

## 2024-02-27 MED ORDER — ONDANSETRON HCL 4 MG/2ML IJ SOLN
INTRAMUSCULAR | Status: AC
  Filled 2024-02-27: qty 2

## 2024-02-27 MED ORDER — OXYCODONE HCL 5 MG PO TABS
5.0000 mg | ORAL_TABLET | ORAL | Status: DC | PRN
  Administered 2024-02-27: 5 mg via ORAL
  Administered 2024-02-28: 10 mg via ORAL
  Administered 2024-02-28: 5 mg via ORAL
  Filled 2024-02-27 (×2): qty 2

## 2024-02-27 MED ORDER — APIXABAN 2.5 MG PO TABS
2.5000 mg | ORAL_TABLET | Freq: Two times a day (BID) | ORAL | Status: DC
  Administered 2024-02-28: 2.5 mg via ORAL
  Filled 2024-02-27: qty 1

## 2024-02-27 MED ORDER — POLYETHYLENE GLYCOL 3350 17 G PO PACK: 17.0000 g | PACK | Freq: Every day | ORAL | Status: DC | PRN

## 2024-02-27 MED ORDER — ONDANSETRON HCL 4 MG PO TABS: 4.0000 mg | ORAL_TABLET | Freq: Three times a day (TID) | ORAL | 0 refills | Status: DC | PRN

## 2024-02-27 MED ORDER — CLONIDINE HCL (ANALGESIA) 100 MCG/ML EP SOLN
EPIDURAL | Status: DC | PRN
Start: 2024-02-27 — End: 2024-02-27
  Administered 2024-02-27: 100 ug

## 2024-02-27 MED ORDER — TRANEXAMIC ACID-NACL 1000-0.7 MG/100ML-% IV SOLN
1000.0000 mg | Freq: Once | INTRAVENOUS | Status: AC
  Administered 2024-02-27: 1000 mg via INTRAVENOUS

## 2024-02-27 MED ORDER — OXYCODONE HCL 5 MG PO TABS
10.0000 mg | ORAL_TABLET | ORAL | Status: DC | PRN
  Administered 2024-02-27: 10 mg via ORAL
  Filled 2024-02-27: qty 2

## 2024-02-27 MED ORDER — 0.9 % SODIUM CHLORIDE (POUR BTL) OPTIME
TOPICAL | Status: DC | PRN
Start: 2024-02-27 — End: 2024-02-27
  Administered 2024-02-27: 1000 mL

## 2024-02-27 MED ORDER — BISACODYL 10 MG RE SUPP: 10.0000 mg | Freq: Every day | RECTAL | Status: DC | PRN

## 2024-02-27 MED ORDER — BUPIVACAINE IN DEXTROSE 0.75-8.25 % IT SOLN
INTRATHECAL | Status: DC | PRN
  Administered 2024-02-27: 1.6 mL via INTRATHECAL

## 2024-02-27 MED ORDER — FENTANYL CITRATE PF 50 MCG/ML IJ SOSY
PREFILLED_SYRINGE | INTRAMUSCULAR | Status: AC
  Filled 2024-02-27: qty 1

## 2024-02-27 MED ORDER — LEVOTHYROXINE SODIUM 75 MCG PO TABS
75.0000 ug | ORAL_TABLET | Freq: Every day | ORAL | Status: DC
  Administered 2024-02-28: 75 ug via ORAL
  Filled 2024-02-27: qty 1

## 2024-02-27 MED ORDER — OXYCODONE HCL 5 MG PO TABS
5.0000 mg | ORAL_TABLET | Freq: Once | ORAL | Status: AC | PRN
  Administered 2024-02-27: 5 mg via ORAL

## 2024-02-27 MED ORDER — ONDANSETRON HCL 4 MG PO TABS: 4.0000 mg | ORAL_TABLET | Freq: Four times a day (QID) | ORAL | Status: DC | PRN

## 2024-02-27 MED ORDER — HYDROMORPHONE HCL 1 MG/ML IJ SOLN
INTRAMUSCULAR | Status: AC
  Filled 2024-02-27: qty 1

## 2024-02-27 MED ORDER — PHENOL 1.4 % MT LIQD: 1.0000 | OROMUCOSAL | Status: DC | PRN

## 2024-02-27 MED ORDER — MIRABEGRON ER 25 MG PO TB24
50.0000 mg | ORAL_TABLET | Freq: Every morning | ORAL | Status: DC
  Administered 2024-02-28: 50 mg via ORAL
  Filled 2024-02-27: qty 2

## 2024-02-27 MED ORDER — CEFAZOLIN SODIUM-DEXTROSE 2-4 GM/100ML-% IV SOLN
2.0000 g | INTRAVENOUS | Status: AC
  Administered 2024-02-27: 2 g via INTRAVENOUS
  Filled 2024-02-27: qty 100

## 2024-02-27 MED ORDER — METOCLOPRAMIDE HCL 5 MG PO TABS: 5.0000 mg | ORAL_TABLET | Freq: Three times a day (TID) | ORAL | Status: DC | PRN

## 2024-02-27 MED ORDER — METHOCARBAMOL 500 MG PO TABS
500.0000 mg | ORAL_TABLET | Freq: Four times a day (QID) | ORAL | Status: DC | PRN
  Administered 2024-02-28 (×2): 500 mg via ORAL
  Filled 2024-02-27 (×3): qty 1

## 2024-02-27 MED ORDER — ACETAMINOPHEN 500 MG PO TABS: 1000.0000 mg | ORAL_TABLET | Freq: Once | ORAL | Status: DC

## 2024-02-27 MED ORDER — PROPOFOL 1000 MG/100ML IV EMUL
INTRAVENOUS | Status: AC
  Filled 2024-02-27: qty 100

## 2024-02-27 MED ORDER — LORAZEPAM 0.5 MG PO TABS
0.5000 mg | ORAL_TABLET | Freq: Every day | ORAL | Status: DC | PRN
  Administered 2024-02-27: 0.5 mg via ORAL
  Filled 2024-02-27 (×2): qty 1

## 2024-02-27 MED ORDER — DROPERIDOL 2.5 MG/ML IJ SOLN: 0.6250 mg | Freq: Once | INTRAMUSCULAR | Status: DC | PRN

## 2024-02-27 MED ORDER — ACETAMINOPHEN ER 650 MG PO TBCR: 1300.0000 mg | EXTENDED_RELEASE_TABLET | Freq: Three times a day (TID) | ORAL | 2 refills | Status: AC | PRN

## 2024-02-27 MED ORDER — BUPIVACAINE HCL (PF) 0.25 % IJ SOLN
INTRAMUSCULAR | Status: AC
  Filled 2024-02-27: qty 30

## 2024-02-27 MED ORDER — MIDAZOLAM HCL 2 MG/2ML IJ SOLN: 1.0000 mg | INTRAMUSCULAR | Status: DC

## 2024-02-27 MED ORDER — CEFAZOLIN SODIUM-DEXTROSE 2-4 GM/100ML-% IV SOLN
INTRAVENOUS | Status: AC
  Filled 2024-02-27: qty 100

## 2024-02-27 MED ORDER — POVIDONE-IODINE 7.5 % EX SOLN: Freq: Once | CUTANEOUS | Status: DC

## 2024-02-27 MED ORDER — BUPIVACAINE-EPINEPHRINE (PF) 0.5% -1:200000 IJ SOLN
INTRAMUSCULAR | Status: DC | PRN
  Administered 2024-02-27: 15 mL via PERINEURAL

## 2024-02-27 MED ORDER — ONDANSETRON HCL 4 MG/2ML IJ SOLN
INTRAMUSCULAR | Status: DC | PRN
  Administered 2024-02-27: 4 mg via INTRAVENOUS

## 2024-02-27 MED ORDER — PROPOFOL 500 MG/50ML IV EMUL
INTRAVENOUS | Status: DC | PRN
Start: 2024-02-27 — End: 2024-02-27
  Administered 2024-02-27: 20 mg via INTRAVENOUS
  Administered 2024-02-27: 50 ug/kg/min via INTRAVENOUS
  Administered 2024-02-27 (×2): 20 mg via INTRAVENOUS

## 2024-02-27 MED ORDER — ONDANSETRON HCL 4 MG/2ML IJ SOLN: 4.0000 mg | Freq: Four times a day (QID) | INTRAMUSCULAR | Status: DC | PRN

## 2024-02-27 MED ORDER — SENNA-DOCUSATE SODIUM 8.6-50 MG PO TABS: 2.0000 | ORAL_TABLET | Freq: Every day | ORAL | 1 refills | Status: DC

## 2024-02-27 MED ORDER — ACETAMINOPHEN 325 MG PO TABS
325.0000 mg | ORAL_TABLET | Freq: Four times a day (QID) | ORAL | Status: DC | PRN
  Filled 2024-02-27: qty 2

## 2024-02-27 MED ORDER — DOCUSATE SODIUM 100 MG PO CAPS
100.0000 mg | ORAL_CAPSULE | Freq: Two times a day (BID) | ORAL | Status: DC
Start: 2024-02-27 — End: 2024-02-28
  Administered 2024-02-27 – 2024-02-28 (×2): 100 mg via ORAL
  Filled 2024-02-27 (×2): qty 1

## 2024-02-27 MED ORDER — POVIDONE-IODINE 10 % EX SWAB: 2.0000 | Freq: Once | CUTANEOUS | Status: DC

## 2024-02-27 MED ORDER — TRANEXAMIC ACID-NACL 1000-0.7 MG/100ML-% IV SOLN
1000.0000 mg | INTRAVENOUS | Status: AC
  Administered 2024-02-27: 1000 mg via INTRAVENOUS
  Filled 2024-02-27: qty 100

## 2024-02-27 MED ORDER — DEXAMETHASONE SODIUM PHOSPHATE 10 MG/ML IJ SOLN
10.0000 mg | Freq: Once | INTRAMUSCULAR | Status: AC
  Administered 2024-02-28: 10 mg via INTRAVENOUS
  Filled 2024-02-27: qty 1

## 2024-02-27 MED ORDER — PROPOFOL 500 MG/50ML IV EMUL
INTRAVENOUS | Status: AC
  Filled 2024-02-27: qty 50

## 2024-02-27 MED ORDER — LACTATED RINGERS IV SOLN: INTRAVENOUS | Status: DC

## 2024-02-27 MED ORDER — BUPIVACAINE HCL 0.25 % IJ SOLN
INTRAMUSCULAR | Status: DC | PRN
  Administered 2024-02-27: 31 mL

## 2024-02-27 MED ORDER — CHLORHEXIDINE GLUCONATE 0.12 % MT SOLN
15.0000 mL | Freq: Once | OROMUCOSAL | Status: AC
Start: 2024-02-27 — End: 2024-02-27
  Administered 2024-02-27: 15 mL via OROMUCOSAL

## 2024-02-27 MED ORDER — MENTHOL 3 MG MT LOZG: 1.0000 | LOZENGE | OROMUCOSAL | Status: DC | PRN

## 2024-02-27 MED ORDER — PHENYLEPHRINE HCL-NACL 20-0.9 MG/250ML-% IV SOLN
INTRAVENOUS | Status: DC | PRN
  Administered 2024-02-27: 40 ug/min via INTRAVENOUS
  Administered 2024-02-27 (×2): 80 ug via INTRAVENOUS

## 2024-02-27 MED ORDER — METHOCARBAMOL 1000 MG/10ML IJ SOLN
500.0000 mg | Freq: Four times a day (QID) | INTRAMUSCULAR | Status: DC | PRN
  Administered 2024-02-27: 500 mg via INTRAVENOUS
  Filled 2024-02-27: qty 10

## 2024-02-27 MED ORDER — OXYCODONE HCL 5 MG PO TABS: 5.0000 mg | ORAL_TABLET | ORAL | 0 refills | Status: DC | PRN

## 2024-02-27 MED ORDER — WATER FOR IRRIGATION, STERILE IR SOLN
Status: DC | PRN
  Administered 2024-02-27: 2000 mL

## 2024-02-27 MED ORDER — ALUM & MAG HYDROXIDE-SIMETH 200-200-20 MG/5ML PO SUSP: 30.0000 mL | ORAL | Status: DC | PRN

## 2024-02-27 MED ORDER — LACTATED RINGERS IV BOLUS
250.0000 mL | Freq: Once | INTRAVENOUS | Status: AC
  Administered 2024-02-28: 250 mL via INTRAVENOUS

## 2024-02-27 SURGICAL SUPPLY — 51 items
ATTUNE PS FEM RT SZ 6 CEM KNEE (Femur) IMPLANT
BAG COUNTER SPONGE SURGICOUNT (BAG) IMPLANT
BAG ZIPLOCK 12X15 (MISCELLANEOUS) IMPLANT
BASEPLATE TIB CMT FB PCKT SZ5 (Knees) IMPLANT
BLADE SAG 18X100X1.27 (BLADE) ×1 IMPLANT
BLADE SAW SGTL 11.0X1.19X90.0M (BLADE) IMPLANT
BLADE SAW SGTL 13X75X1.27 (BLADE) ×2 IMPLANT
BLADE SURG 15 STRL LF DISP TIS (BLADE) ×1 IMPLANT
BNDG ELASTIC 6X10 VLCR STRL LF (GAUZE/BANDAGES/DRESSINGS) ×2 IMPLANT
BOWL SMART MIX CTS (DISPOSABLE) ×2 IMPLANT
CEMENT HV SMART SET (Cement) ×2 IMPLANT
CLSR STERI-STRIP ANTIMIC 1/2X4 (GAUZE/BANDAGES/DRESSINGS) ×2 IMPLANT
COVER SURGICAL LIGHT HANDLE (MISCELLANEOUS) ×2 IMPLANT
CUFF TRNQT CYL 34X4.125X (TOURNIQUET CUFF) ×1 IMPLANT
DRAPE SHEET LG 3/4 BI-LAMINATE (DRAPES) ×2 IMPLANT
DRAPE U-SHAPE 47X51 STRL (DRAPES) ×1 IMPLANT
DRSG MEPILEX POST OP 4X12 (GAUZE/BANDAGES/DRESSINGS) ×2 IMPLANT
DURAPREP 26ML APPLICATOR (WOUND CARE) ×2 IMPLANT
ELECT PENCIL ROCKER SW 15FT (MISCELLANEOUS) ×1 IMPLANT
ELECT REM PT RETURN 15FT ADLT (MISCELLANEOUS) ×1 IMPLANT
GAUZE PAD ABD 8X10 STRL (GAUZE/BANDAGES/DRESSINGS) ×2 IMPLANT
GLOVE BIO SURGEON STRL SZ 6.5 (GLOVE) ×2 IMPLANT
GLOVE BIO SURGEON STRL SZ7.5 (GLOVE) ×2 IMPLANT
GLOVE BIOGEL PI IND STRL 7.0 (GLOVE) ×2 IMPLANT
GLOVE BIOGEL PI IND STRL 8 (GLOVE) ×2 IMPLANT
GOWN STRL SURGICAL XL XLNG (GOWN DISPOSABLE) ×4 IMPLANT
HOLDER FOLEY CATH W/STRAP (MISCELLANEOUS) ×1 IMPLANT
HOOD PEEL AWAY T7 (MISCELLANEOUS) ×6 IMPLANT
IMMOBILIZER KNEE 20 (SOFTGOODS) ×1 IMPLANT
IMMOBILIZER KNEE 20 THIGH 36 (SOFTGOODS) ×1 IMPLANT
INSERT TIB PS FB ATTUNE SZ6X5 (Knees) IMPLANT
KIT TURNOVER KIT A (KITS) ×1 IMPLANT
MANIFOLD NEPTUNE II (INSTRUMENTS) ×1 IMPLANT
NS IRRIG 1000ML POUR BTL (IV SOLUTION) ×1 IMPLANT
PACK TOTAL KNEE CUSTOM (KITS) ×1 IMPLANT
PATELLA MEDIAL ATTUN 35MM KNEE (Knees) IMPLANT
PIN STEINMAN FIXATION KNEE (PIN) IMPLANT
PROTECTOR NERVE ULNAR (MISCELLANEOUS) ×2 IMPLANT
SET HNDPC FAN SPRY TIP SCT (DISPOSABLE) ×1 IMPLANT
SET PAD KNEE POSITIONER (MISCELLANEOUS) ×2 IMPLANT
SPIKE FLUID TRANSFER (MISCELLANEOUS) IMPLANT
SUT MNCRL AB 3-0 PS2 18 (SUTURE) IMPLANT
SUT STRATAFIX PDS+ 0 24IN (SUTURE) ×2 IMPLANT
SUT VIC AB 0 CT1 36 (SUTURE) IMPLANT
SUT VIC AB 2-0 CT1 TAPERPNT 27 (SUTURE) ×2 IMPLANT
SUT VIC AB 3-0 SH 27X BRD (SUTURE) ×4 IMPLANT
TRAY FOLEY MTR SLVR 14FR STAT (SET/KITS/TRAYS/PACK) IMPLANT
TRAY FOLEY MTR SLVR 16FR STAT (SET/KITS/TRAYS/PACK) ×2 IMPLANT
TUBE SUCTION HIGH CAP CLEAR NV (SUCTIONS) ×1 IMPLANT
WATER STERILE IRR 1000ML POUR (IV SOLUTION) ×2 IMPLANT
WRAP KNEE MAXI GEL POST OP (GAUZE/BANDAGES/DRESSINGS) ×2 IMPLANT

## 2024-02-27 NOTE — Evaluation (Signed)
 Physical Therapy Evaluation Patient Details Name: Ashley Moore MRN: [id number] DOB: 25-Jul-1951 Today's Date: 02/27/2024  History of Present Illness  73 yo female presents to therapy s/p R TKA on 02/27/2024 due to failure of conservative measures. Pt PMH includes but is not limited to: metastatic anal cancer, right kidney hydronephrosis, chronic low back pain, chronic radiation cystitis, hypothyroidism, bowens disease and lymphedema.  Clinical Impression    Ashley Moore is a 73 y.o. female POD 0 s/p R TKA. Patient reports mod I with mobility at baseline. Patient is now limited by functional impairments (see PT problem list below) and requires CGA for supine to sit and min A for sit to supine for bed mobility and CGA and cues for transfers. Patient was able to ambulate 30 feet with RW and CGA level of assist. Patient instructed in exercise to facilitate ROM and circulation to manage edema initiated in supine. Pt instruction with step navigation with CGA to ascend steps with B handrail and total A to descend one step due to LE instability with pt electing to perform retrograde stepping pattern per pt report PLOF.   Patient will benefit from continued skilled PT interventions to address impairments and progress towards PLOF. Acute PT will follow to progress mobility and stair training in preparation for safe discharge home with family support and [facility] services.       If plan is discharge home, recommend the following: A little help with walking and/or transfers;A little help with bathing/dressing/bathroom;Assistance with cooking/housework;Assist for transportation;Help with stairs or ramp for entrance   Can travel by private vehicle        Equipment Recommendations None recommended by PT  Recommendations for Other Services       Functional Status Assessment Patient has had a recent decline in their functional status and demonstrates the ability to make significant improvements in  function in a reasonable and predictable amount of time.     Precautions / Restrictions Precautions Precautions: Fall;Knee Restrictions Weight Bearing Restrictions Per Provider Order: No      Mobility  Bed Mobility Overal bed mobility: Needs Assistance Bed Mobility: Supine to Sit     Supine to sit: Supervision     General bed mobility comments: min cues    Transfers Overall transfer level: Needs assistance Equipment used: Rolling walker (2 wheels) Transfers: Sit to/from Stand Sit to Stand: Contact guard assist           General transfer comment: min cues- transfer bed, stretcher and recliner    Ambulation/Gait Ambulation/Gait assistance: Contact guard assist Gait Distance (Feet): 30 Feet Assistive device: Rolling walker (2 wheels) Gait Pattern/deviations: Step-to pattern, Decreased stance time - right, Antalgic, Trunk flexed Gait velocity: decreased     General Gait Details: pt reported a sudden increase in pain with gait tasks, step to pattern with pt personal RW lowered however this PT feels not an appropriate height for pt, pt electing to intermittently place forearms on RW hand holds or reach for cross support on RW, pt became tearful during amb, cues for posture proper distance from RW and safety  Stairs Stairs: Yes Stairs assistance: Contact guard assist, Total assist Stair Management: Two rails Number of Stairs: 2 General stair comments: ascending steps B handrail and step to pattern with min cues and CGA, pt reported that she is unable to descend the steps anteriorly and always performs retrograde stepping pattern during the course of the attempt with the first step pt had a buckling incident PT unclear if  L or R LE at the time with pt reporting nonsurgical L LE "gave out" PT thus required to provide total assist and have pt sit on therapist leg until nursing staff able to provide and place recliner for pt to sit from the second step, pt reports that her R  shoulder was broken and she is unable to support herself with her R UE and was reaching for the vertical supports on steps vs handrails. pt will require reinforcement for safety with step navigation  Wheelchair Mobility     Tilt Bed    Modified Rankin (Stroke Patients Only)       Balance Overall balance assessment: Needs assistance Sitting-balance support: Feet supported Sitting balance-Leahy Scale: Fair     Standing balance support: Bilateral upper extremity supported, During functional activity, Reliant on assistive device for balance Standing balance-Leahy Scale: Poor                               Pertinent Vitals/Pain Pain Assessment Pain Assessment: 0-10 Pain Score: 8  Pain Location: R LE and knee Pain Descriptors / Indicators: Aching, Constant, Discomfort, Dull, Grimacing, Operative site guarding Pain Intervention(s): Limited activity within patient's tolerance, Monitored during session, Premedicated before session, Repositioned, Ice applied, Patient requesting pain meds-RN notified, RN gave pain meds during session    Home Living Family/patient expects to be discharged to:: Private residence Living Arrangements: Alone Available Help at Discharge: Family Type of Home: House Home Access: Stairs to enter Entrance Stairs-Rails: Doctor, general practice of Steps: 4   Home Layout: One level Home Equipment: Agricultural consultant (2 wheels);Cane - single point;Shower seat;BSC/3in1      Prior Function Prior Level of Function : Independent/Modified Independent             Mobility Comments: SPC or RW for mobility mod I for all ADLs, self care tasks and IADLs       Extremity/Trunk Assessment        Lower Extremity Assessment Lower Extremity Assessment: RLE deficits/detail RLE Deficits / Details: ankle DF.PF 5/5; SLR < 10 degree lag RLE Sensation: WNL       Communication   Communication Communication: No apparent difficulties     Cognition Arousal: Alert Behavior During Therapy: WFL for tasks assessed/performed   PT - Cognitive impairments: No apparent impairments                         Following commands: Intact       Cueing       General Comments      Exercises Total Joint Exercises Ankle Circles/Pumps: AROM, Both, 10 reps Quad Sets: AROM, Right, 5 reps Short Arc Quad: AROM, Right, 5 reps Heel Slides: AROM, Right, 5 reps Hip ABduction/ADduction: AROM, Right, 5 reps Straight Leg Raises: AROM, Right, 5 reps   Assessment/Plan    PT Assessment Patient needs continued PT services  PT Problem List Decreased strength;Decreased range of motion;Decreased activity tolerance;Decreased balance;Decreased mobility;Pain       PT Treatment Interventions DME instruction;Gait training;Stair training;Functional mobility training;Therapeutic activities;Therapeutic exercise;Balance training;Neuromuscular re-education;Patient/family education;Modalities    PT Goals (Current goals can be found in the Care Plan section)  Acute Rehab PT Goals Patient Stated Goal: to be able to hike, go camping, shoping travel PT Goal Formulation: With patient Time For Goal Achievement: 03/12/24 Potential to Achieve Goals: Good    Frequency 7X/week     Co-evaluation  AM-PAC PT "6 Clicks" Mobility  Outcome Measure Help needed turning from your back to your side while in a flat bed without using bedrails?: A Little Help needed moving from lying on your back to sitting on the side of a flat bed without using bedrails?: A Little Help needed moving to and from a bed to a chair (including a wheelchair)?: A Little Help needed standing up from a chair using your arms (e.g., wheelchair or bedside chair)?: A Little Help needed to walk in hospital room?: A Little Help needed climbing 3-5 steps with a railing? : Total 6 Click Score: 16    End of Session Equipment Utilized During Treatment: Gait  belt Activity Tolerance: Patient limited by pain;Other (comment) (LE instabiltiy with pt requiring total A for fall prevention) Patient left: in bed;with call bell/phone within reach;with nursing/sitter in room;with family/visitor present Nurse Communication: Mobility status;Other (comment) (pt not safe for same day d/c at this time) PT Visit Diagnosis: Unsteadiness on feet (R26.81);Other abnormalities of gait and mobility (R26.89);Muscle weakness (generalized) (M62.81);Difficulty in walking, not elsewhere classified (R26.2);Pain Pain - Right/Left: Right Pain - part of body: Leg;Knee    Time: 9811-9147 PT Time Calculation (min) (ACUTE ONLY): 57 min   Charges:   PT Evaluation $PT Eval Low Complexity: 1 Low PT Treatments $Gait Training: 8-22 mins $Therapeutic Exercise: 8-22 mins $Therapeutic Activity: 8-22 mins PT General Charges $$ ACUTE PT VISIT: 1 Visit         Cary Clarks, PT Acute Rehab   Annalee Kiang 02/27/2024, 7:22 PM

## 2024-02-27 NOTE — Plan of Care (Signed)
  Problem: Education: Goal: Knowledge of General Education information will improve Description: Including pain rating scale, medication(s)/side effects and non-pharmacologic comfort measures 02/27/2024 1848 by Kerwin Peels, RN Outcome: Progressing 02/27/2024 1837 by Kerwin Peels, RN Outcome: Progressing   Problem: Health Behavior/Discharge Planning: Goal: Ability to manage health-related needs will improve 02/27/2024 1848 by Kerwin Peels, RN Outcome: Progressing 02/27/2024 1837 by Kerwin Peels, RN Outcome: Progressing   Problem: Clinical Measurements: Goal: Ability to maintain clinical measurements within normal limits will improve 02/27/2024 1848 by Kerwin Peels, RN Outcome: Progressing 02/27/2024 1837 by Kerwin Peels, RN Outcome: Progressing Goal: Will remain free from infection 02/27/2024 1848 by Kerwin Peels, RN Outcome: Progressing 02/27/2024 1837 by Kerwin Peels, RN Outcome: Progressing Goal: Diagnostic test results will improve Outcome: Progressing

## 2024-02-27 NOTE — Interval H&P Note (Signed)
 History and Physical Interval Note:  02/27/2024 9:22 AM  Ashley Moore  has presented today for surgery, with the diagnosis of right knee OSTEOARTHRITIS.  The various methods of treatment have been discussed with the patient and family. After consideration of risks, benefits and other options for treatment, the patient has consented to  Procedure(s): ARTHROPLASTY, KNEE, TOTAL (Right) as a surgical intervention.  The patient's history has been reviewed, patient examined, no change in status, stable for surgery.  I have reviewed the patient's chart and labs.  Questions were answered to the patient's satisfaction.     Neville Barbone

## 2024-02-27 NOTE — Plan of Care (Signed)

## 2024-02-27 NOTE — Anesthesia Postprocedure Evaluation (Signed)
 Anesthesia Post Note  Patient: Ashley Moore  Procedure(s) Performed: ARTHROPLASTY, KNEE, TOTAL (Right: Knee)     Patient location during evaluation: PACU Anesthesia Type: Spinal Level of consciousness: awake and alert Pain management: pain level controlled Vital Signs Assessment: post-procedure vital signs reviewed and stable Respiratory status: spontaneous breathing, nonlabored ventilation and respiratory function stable Cardiovascular status: blood pressure returned to baseline Postop Assessment: no apparent nausea or vomiting, spinal receding, no headache and no backache Anesthetic complications: no   No notable events documented.  Last Vitals:  Vitals:   02/27/24 1415 02/27/24 1452  BP: 105/61 106/60  Pulse: 78 73  Resp: 18 20  Temp:  36.9 C  SpO2: 93% 98%    Last Pain:  Vitals:   02/27/24 1452  TempSrc: Temporal  PainSc: 3                  Rayfield Cairo

## 2024-02-27 NOTE — Anesthesia Procedure Notes (Signed)
 Anesthesia Regional Block: Adductor canal block   Pre-Anesthetic Checklist: , timeout performed,  Correct Patient, Correct Site, Correct Laterality,  Correct Procedure, Correct Position, site marked,  Risks and benefits discussed,  Pre-op evaluation,  At surgeon's request and post-op pain management  Laterality: Right  Prep: Maximum Sterile Barrier Precautions used, chloraprep       Needles:  Injection technique: Single-shot  Needle Type: Echogenic Stimulator Needle     Needle Length: 9cm  Needle Gauge: 22     Additional Needles:   Procedures:,,,, ultrasound used (permanent image in chart),,    Narrative:  Start time: 02/27/2024 10:40 AM End time: 02/27/2024 10:43 AM Injection made incrementally with aspirations every 5 mL.  Performed by: Personally  Anesthesiologist: Vernadine Golas, MD  Additional Notes: Risks, benefits, and alternative discussed. Patient gave consent for procedure. Patient prepped and draped in sterile fashion. Sedation administered, patient remains easily responsive to voice. Relevant anatomy identified with ultrasound guidance. Local anesthetic given in 5cc increments with no signs or symptoms of intravascular injection. No pain or paraesthesias with injection. Patient monitored throughout procedure with signs of LAST or immediate complications. Tolerated well. Ultrasound image placed in chart.  Ashley Junes, MD

## 2024-02-27 NOTE — Discharge Instructions (Addendum)
 INSTRUCTIONS AFTER JOINT REPLACEMENT   Remove items at home which could result in a fall. This includes throw rugs or furniture in walking pathways ICE to the affected joint every three hours while awake for 30 minutes at a time, for at least the first 3-5 days, and then as needed for pain and swelling.  Continue to use ice for pain and swelling. You may notice swelling that will progress down to the foot and ankle.  This is normal after surgery.  Elevate your leg when you are not up walking on it.   Continue to use the breathing machine you got in the hospital (incentive spirometer) which will help keep your temperature down.  It is common for your temperature to cycle up and down following surgery, especially at night when you are not up moving around and exerting yourself.  The breathing machine keeps your lungs expanded and your temperature down.   DIET:  As you were doing prior to hospitalization, we recommend a well-balanced diet.  DRESSING / WOUND CARE / SHOWERING  You may shower 3 days after surgery, but keep the wounds dry during showering.  You may use an occlusive plastic wrap (Press'n Seal for example), NO SOAKING/SUBMERGING IN THE BATHTUB.  If the bandage gets wet, change with a clean dry gauze.  If the incision gets wet, pat the wound dry with a clean towel. You have an ace wrap wrapped around your leg with 2 white abd pads underneath. You may remove these 24 hours after surgery. Keep the dressing stuck to you leg in place. After you have removed the ace wrap. Please you the compression stockings on both legs during the day (you may remove them at night) until we see you back for your follow up visit.   ACTIVITY  Increase activity slowly as tolerated, but follow the weight bearing instructions below.   No driving for 6 weeks or until further direction given by your physician.  You cannot drive while taking narcotics.  No lifting or carrying greater than 10 lbs. until further directed  by your surgeon. Avoid periods of inactivity such as sitting longer than an hour when not asleep. This helps prevent blood clots.  You may return to work once you are authorized by your doctor.     WEIGHT BEARING   Weight bearing as tolerated with assist device (walker, cane, etc) as directed, use it as long as suggested by your surgeon or therapist, typically at least 4-6 weeks.   EXERCISES  Results after joint replacement surgery are often greatly improved when you follow the exercise, range of motion and muscle strengthening exercises prescribed by your doctor. Safety measures are also important to protect the joint from further injury. Any time any of these exercises cause you to have increased pain or swelling, decrease what you are doing until you are comfortable again and then slowly increase them. If you have problems or questions, call your caregiver or physical therapist for advice.   Rehabilitation is important following a joint replacement. After just a few days of immobilization, the muscles of the leg can become weakened and shrink (atrophy).  These exercises are designed to build up the tone and strength of the thigh and leg muscles and to improve motion. Often times heat used for twenty to thirty minutes before working out will loosen up your tissues and help with improving the range of motion but do not use heat for the first two weeks following surgery (sometimes heat can increase  post-operative swelling).   These exercises can be done on a training (exercise) mat, on the floor, on a table or on a bed. Use whatever works the best and is most comfortable for you.    Use music or television while you are exercising so that the exercises are a pleasant break in your day. This will make your life better with the exercises acting as a break in your routine that you can look forward to.   Perform all exercises about fifteen times, three times per day or as directed.  You should exercise  both the operative leg and the other leg as well.  Exercises include:   Quad Sets - Tighten up the muscle on the front of the thigh (Quad) and hold for 5-10 seconds.   Straight Leg Raises - With your knee straight (if you were given a brace, keep it on), lift the leg to 60 degrees, hold for 3 seconds, and slowly lower the leg.  Perform this exercise against resistance later as your leg gets stronger.  Leg Slides: Lying on your back, slowly slide your foot toward your buttocks, bending your knee up off the floor (only go as far as is comfortable). Then slowly slide your foot back down until your leg is flat on the floor again.  Angel Wings: Lying on your back spread your legs to the side as far apart as you can without causing discomfort.  Hamstring Strength:  Lying on your back, push your heel against the floor with your leg straight by tightening up the muscles of your buttocks.  Repeat, but this time bend your knee to a comfortable angle, and push your heel against the floor.  You may put a pillow under the heel to make it more comfortable if necessary.   A rehabilitation program following joint replacement surgery can speed recovery and prevent re-injury in the future due to weakened muscles. Contact your doctor or a physical therapist for more information on knee rehabilitation.    CONSTIPATION  Constipation is defined medically as fewer than three stools per week and severe constipation as less than one stool per week.  Even if you have a regular bowel pattern at home, your normal regimen is likely to be disrupted due to multiple reasons following surgery.  Combination of anesthesia, postoperative narcotics, change in appetite and fluid intake all can affect your bowels.   YOU MUST use at least one of the following options; they are listed in order of increasing strength to get the job done.  They are all available over the counter, and you may need to use some, POSSIBLY even all of these  options:    Drink plenty of fluids (prune juice may be helpful) and high fiber foods Colace 100 mg by mouth twice a day  Senokot for constipation as directed and as needed Dulcolax (bisacodyl), take with full glass of water  Miralax  (polyethylene glycol) once or twice a day as needed.  If you have tried all these things and are unable to have a bowel movement in the first 3-4 days after surgery call either your surgeon or your primary doctor.    If you experience loose stools or diarrhea, hold the medications until you stool forms back up.  If your symptoms do not get better within 1 week or if they get worse, check with your doctor.  If you experience "the worst abdominal pain ever" or develop nausea or vomiting, please contact the office immediately for further recommendations  for treatment.   ITCHING:  If you experience itching with your medications, try taking only a single pain pill, or even half a pain pill at a time.  You can also use Benadryl over the counter for itching or also to help with sleep.   TED HOSE STOCKINGS:  Use stockings on both legs until for at least 2 weeks or as directed by physician office. They may be removed at night for sleeping.  MEDICATIONS:  See your medication summary on the "After Visit Summary" that nursing will review with you.  You may have some home medications which will be placed on hold until you complete the course of blood thinner medication.  It is important for you to complete the blood thinner medication as prescribed.  PRECAUTIONS:  If you experience chest pain or shortness of breath - call 911 immediately for transfer to the hospital emergency department.   If you develop a fever greater that 101 F, purulent drainage from wound, increased redness or drainage from wound, foul odor from the wound/dressing, or calf pain - CONTACT YOUR SURGEON.                                                   FOLLOW-UP APPOINTMENTS:  If you do not already have a  post-op appointment, please call the office for an appointment to be seen by your surgeon.  Guidelines for how soon to be seen are listed in your "After Visit Summary", but are typically between 1-4 weeks after surgery.  OTHER INSTRUCTIONS:   Knee Replacement:  Do not place pillow under knee, focus on keeping the knee straight while resting.  POST-OPERATIVE OPIOID TAPER INSTRUCTIONS: It is important to wean off of your opioid medication as soon as possible. If you do not need pain medication after your surgery it is ok to stop day one. Opioids include: Codeine, Hydrocodone (Norco, Vicodin), Oxycodone (Percocet, oxycontin ) and hydromorphone  amongst others.  Long term and even short term use of opiods can cause: Increased pain response Dependence Constipation Depression Respiratory depression And more.  Withdrawal symptoms can include Flu like symptoms Nausea, vomiting And more Techniques to manage these symptoms Hydrate well Eat regular healthy meals Stay active Use relaxation techniques(deep breathing, meditating, yoga) Do Not substitute Alcohol to help with tapering If you have been on opioids for less than two weeks and do not have pain than it is ok to stop all together.  Plan to wean off of opioids This plan should start within one week post op of your joint replacement. Maintain the same interval or time between taking each dose and first decrease the dose.  Cut the total daily intake of opioids by one tablet each day Next start to increase the time between doses. The last dose that should be eliminated is the evening dose.   MAKE SURE YOU:  Understand these instructions.  Get help right away if you are not doing well or get worse.    Thank you for letting us  be a part of your medical care team.  It is a privilege we respect greatly.  We hope these instructions will help you stay on track for a fast and full recovery!     Information on my medicine - ELIQUIS  (apixaban)  This medication education was reviewed with me or my healthcare representative as part of my discharge  preparation.  The pharmacist that spoke with me during my hospital stay was:    Why was Eliquis prescribed for you? Eliquis was prescribed for you to reduce the risk of blood clots forming after orthopedic surgery.    What do You need to know about Eliquis? Take your Eliquis TWICE DAILY - one tablet in the morning and one tablet in the evening with or without food.  It would be best to take the dose about the same time each day.  If you have difficulty swallowing the tablet whole please discuss with your pharmacist how to take the medication safely.  Take Eliquis exactly as prescribed by your doctor and DO NOT stop taking Eliquis without talking to the doctor who prescribed the medication.  Stopping without other medication to take the place of Eliquis may increase your risk of developing a clot.  After discharge, you should have regular check-up appointments with your healthcare provider that is prescribing your Eliquis.  What do you do if you miss a dose? If a dose of ELIQUIS is not taken at the scheduled time, take it as soon as possible on the same day and twice-daily administration should be resumed.  The dose should not be doubled to make up for a missed dose.  Do not take more than one tablet of ELIQUIS at the same time.  Important Safety Information A possible side effect of Eliquis is bleeding. You should call your healthcare provider right away if you experience any of the following: Bleeding from an injury or your nose that does not stop. Unusual colored urine (red or dark brown) or unusual colored stools (red or black). Unusual bruising for unknown reasons. A serious fall or if you hit your head (even if there is no bleeding).  Some medicines may interact with Eliquis and might increase your risk of bleeding or clotting while on Eliquis. To help avoid  this, consult your healthcare provider or pharmacist prior to using any new prescription or non-prescription medications, including herbals, vitamins, non-steroidal anti-inflammatory drugs (NSAIDs) and supplements.  This website has more information on Eliquis (apixaban): http://www.eliquis.com/eliquis/home

## 2024-02-27 NOTE — Anesthesia Procedure Notes (Signed)
 Procedure Name: MAC Date/Time: 02/27/2024 11:03 AM  Performed by: Manuela Sella, CRNAPre-anesthesia Checklist: Patient identified, Emergency Drugs available, Suction available and Patient being monitored Patient Re-evaluated:Patient Re-evaluated prior to induction Oxygen Delivery Method: Simple face mask Preoxygenation: Pre-oxygenation with 100% oxygen Placement Confirmation: positive ETCO2 and breath sounds checked- equal and bilateral Dental Injury: Teeth and Oropharynx as per pre-operative assessment

## 2024-02-27 NOTE — Anesthesia Procedure Notes (Signed)
 Spinal  Patient location during procedure: OR Start time: 02/27/2024 11:02 AM End time: 02/27/2024 11:05 AM Reason for block: surgical anesthesia Staffing Performed: anesthesiologist  Anesthesiologist: Vernadine Golas, MD Performed by: Vernadine Golas, MD Authorized by: Vernadine Golas, MD   Preanesthetic Checklist Completed: patient identified, IV checked, risks and benefits discussed, surgical consent, monitors and equipment checked, pre-op evaluation and timeout performed Spinal Block Patient position: sitting Prep: DuraPrep and site prepped and draped Patient monitoring: continuous pulse ox, blood pressure and heart rate Approach: midline Location: L3-4 Injection technique: single-shot Needle Needle type: Pencan  Needle gauge: 24 G Needle length: 9 cm Assessment Events: CSF return Additional Notes Risks, benefits, and alternative discussed. Patient gave consent to procedure. Prepped and draped in sitting position. Patient sedated but responsive to voice. Clear CSF obtained after one needle pass. Positive terminal aspiration. No pain or paraesthesias with injection. Patient tolerated procedure well. Vital signs stable. Amador Junes, MD

## 2024-02-27 NOTE — Transfer of Care (Signed)
 Immediate Anesthesia Transfer of Care Note  Patient: Ashley Moore  Procedure(s) Performed: Procedure(s): ARTHROPLASTY, KNEE, TOTAL (Right)  Patient Location: PACU  Anesthesia Type:MAC, Regional, and Spinal  Level of Consciousness: Patient easily awoken, comfortable, cooperative, following commands, responds to stimulation.   Airway & Oxygen Therapy: Patient spontaneously breathing, ventilating well, oxygen via simple oxygen mask.  Post-op Assessment: Report given to PACU RN, vital signs reviewed and stable, moving all extremities.   Post vital signs: Reviewed and stable.  Complications: No apparent anesthesia complications Last Vitals:  Vitals Value Taken Time  BP 113/62 02/27/24 1334  Temp    Pulse 90 02/27/24 1336  Resp 14 02/27/24 1336  SpO2 100 % 02/27/24 1336  Vitals shown include unfiled device data.  Last Pain:  Vitals:   02/27/24 1055  TempSrc:   PainSc: 0-No pain         Complications: No notable events documented.

## 2024-02-27 NOTE — Op Note (Signed)
 DATE OF SURGERY:  02/27/2024 TIME: 12:59 PM  PATIENT NAME:  Ashley Moore   AGE: 73 y.o.    PRE-OPERATIVE DIAGNOSIS: Right knee primary localized osteoarthritis  POST-OPERATIVE DIAGNOSIS:  Same  PROCEDURE: Right total Knee Arthroplasty  SURGEON:  Neville Barbone, MD   ASSISTANT:  Hurshel Maidens, PA-C, present and scrubbed throughout the case, critical for assistance with exposure, retraction, instrumentation, and closure.  OPERATIVE IMPLANTS: Implant Name: CEMENT HV SMART SET - [id number] Type: Cement Inv. Item: CEMENT HV SMART SET Serial No.:  Manufacturer: [employer] Lot No.: [id number] LRB: Right No. Used: 2 Action: Implanted   Implant Name: BASEPLATE TIB CMT FB PCKT SZ5 - [id number] Type: Knees Inv. Item: BASEPLATE TIB CMT FB PCKT SZ5 Serial No.:  Manufacturer: [employer] Lot No.: [id number] LRB: Right No. Used: 1 Action: Implanted   Implant Name: ATTUNE PS FEM RT SZ 6 CEM KNEE - [id number] Type: Femur Inv. Item: ATTUNE PS FEM RT SZ 6 CEM KNEE Serial No.:  Manufacturer: [employer] Lot No.: [id number] LRB: Right No. Used: 1 Action: Implanted   Implant Name: PATELLA MEDIAL ATTUN KNEE - [id number] Type: Knees Inv. Item: PATELLA MEDIAL ATTUN KNEE Serial No.:  Manufacturer: [employer] Lot No.: [id number] LRB: Right No. Used: 1 Action: Implanted   Implant Name: INSERT TIB PS FB ATTUNE [id number] - [id number] Type: Knees Inv. Item: INSERT TIB PS FB ATTUNE [id number] Serial No.:  Manufacturer: [employer] Lot No.: [id number] LRB: Right No. Used: 1 Action: Implanted   PREOPERATIVE INDICATIONS:  Ashley Moore is a 72 y.o. year old female with end stage bone on bone degenerative arthritis of the knee who failed conservative treatment, including injections, antiinflammatories, activity modification, and assistive devices, and had significant impairment of their activities of daily living, and elected for Total  Knee Arthroplasty.   The risks, benefits, and alternatives were discussed at length including but not limited to the risks of infection, bleeding, nerve injury, stiffness, blood clots, the need for revision surgery, cardiopulmonary complications, among others, and they were willing to proceed.  OPERATIVE FINDINGS AND UNIQUE ASPECTS OF THE CASE: She had severe valgus deformity with about a 40 degree valgus alignment, she also had significant hyperextension, at least 20 degrees.  Overall ligamentous laxity.  Bone quality was mediocre at best.  I cut the femur on 11 mm, and still barely got anything off of the lateral femoral condyle.  I ended up cutting the femur a second time.  I cut the femur on 7 degrees of external rotation, which aligned pretty well with [employer]  The patella measured 21 mm before the cut and 14 mm after the cut.  I cut the tibia taking 1 mm off of the lateral side, which was extremely scalloped out, I tried to align the tray to the tibial tubercle, and if anything I was slightly overly externally rotated, but it tended to optimize the patellar tracking.  There was a large posterolateral loose body embedded in the tissues that I dug out, it basically had to come out piecemeal, and I was concerned about the posterior neurovascular structures so I did not get overly aggressive.  I had excellent hemostasis at the completion of the case.  ESTIMATED BLOOD LOSS: 150 mL  OPERATIVE DESCRIPTION:  The patient was brought to the operative room and placed in a supine position.  Anesthesia was administered.  IV antibiotics were given.  The lower extremity was prepped and draped in the usual sterile fashion.  Time out was performed.  The leg was elevated and exsanguinated and the tourniquet was inflated.  Anterior quadriceps tendon splitting approach was performed.  The patella was everted and osteophytes were removed.  The anterior horn of the medial and lateral meniscus was removed.    The patella was then measured, and cut with the saw.  The thickness before the cut was 21 and after the cut was 14.  A metal shield was used to protect the patella throughout the case.    The distal femur was opened with the drill and the intramedullary distal femoral cutting jig was utilized, set at 5 degrees resecting 9 mm off the distal femur.  Care was taken to protect the collateral ligaments.  Then the extramedullary tibial cutting jig was utilized making the appropriate cut using the anterior tibial crest as a reference building in appropriate posterior slope.  Care was taken during the cut to protect the medial and collateral ligaments.  The proximal tibia was removed along with the posterior horns of the menisci.  The PCL was sacrificed.    The extensor gap was measured and found to have adequate resection, measuring to a size 5.    The distal femoral sizing jig was applied, taking care to avoid notching.  This was set at 3 degrees of external rotation.  Then the 4-in-1 cutting jig was applied and the anterior and posterior femur was cut, along with the chamfer cuts.  All posterior osteophytes were removed.  The flexion gap was then measured and was symmetric with the extension gap.  I completed the distal femoral preparation using the appropriate jig to prepare the box.  The proximal tibia sized and prepared accordingly with the reamer and the punch, and then all components were trialed with the poly insert.  The knee was found to have excellent balance and full motion.    The above named components were then cemented into place and all excess cement was removed.  The real polyethylene implant was placed.  After the cement had cured I released the tourniquet and confirmed excellent hemostasis with no major posterior vessel injury.    The knee was easily taken through a range of motion and the patella tracked well and the knee irrigated copiously and the parapatellar tissue closed with  Stratafix and vicryl, and subcutaneous tissue closed with vicryl, and monocryl with steri strips for the skin.  The wounds were injected with marcaine , and dressed with sterile gauze and the patient was awakened and returned to the PACU in stable and satisfactory condition.  There were no complications.  Total tourniquet time was 75 minutes.

## 2024-02-28 ENCOUNTER — Encounter (HOSPITAL_COMMUNITY): Payer: Self-pay | Admitting: Orthopedic Surgery

## 2024-02-28 ENCOUNTER — Other Ambulatory Visit: Payer: Self-pay

## 2024-02-28 DIAGNOSIS — Z79899 Other long term (current) drug therapy: Secondary | ICD-10-CM | POA: Diagnosis not present

## 2024-02-28 DIAGNOSIS — E039 Hypothyroidism, unspecified: Secondary | ICD-10-CM | POA: Diagnosis not present

## 2024-02-28 DIAGNOSIS — Z87891 Personal history of nicotine dependence: Secondary | ICD-10-CM | POA: Diagnosis not present

## 2024-02-28 DIAGNOSIS — Z85048 Personal history of other malignant neoplasm of rectum, rectosigmoid junction, and anus: Secondary | ICD-10-CM | POA: Diagnosis not present

## 2024-02-28 DIAGNOSIS — M1711 Unilateral primary osteoarthritis, right knee: Secondary | ICD-10-CM | POA: Diagnosis not present

## 2024-02-28 LAB — CBC
HCT: 34.8 % — ABNORMAL LOW (ref 36.0–46.0)
Hemoglobin: 10.6 g/dL — ABNORMAL LOW (ref 12.0–15.0)
MCH: 28.9 pg (ref 26.0–34.0)
MCHC: 30.5 g/dL (ref 30.0–36.0)
MCV: 94.8 fL (ref 80.0–100.0)
Platelets: 156 10*3/uL (ref 150–400)
RBC: 3.67 MIL/uL — ABNORMAL LOW (ref 3.87–5.11)
RDW: 14.4 % (ref 11.5–15.5)
WBC: 5.2 10*3/uL (ref 4.0–10.5)
nRBC: 0 % (ref 0.0–0.2)

## 2024-02-28 LAB — BASIC METABOLIC PANEL WITH GFR
Anion gap: 6 (ref 5–15)
BUN: 17 mg/dL (ref 8–23)
CO2: 23 mmol/L (ref 22–32)
Calcium: 8.7 mg/dL — ABNORMAL LOW (ref 8.9–10.3)
Chloride: 105 mmol/L (ref 98–111)
Creatinine, Ser: 0.74 mg/dL (ref 0.44–1.00)
GFR, Estimated: >60 mL/min (ref >60)
Glucose, Bld: 124 mg/dL — ABNORMAL HIGH (ref 70–99)
Potassium: 4 mmol/L (ref 3.5–5.1)
Sodium: 134 mmol/L — ABNORMAL LOW (ref 135–145)

## 2024-02-28 NOTE — TOC Initial Note (Signed)
 Transition of Care [facility]) - Initial/Assessment Note    Patient Details  Name: Ashley Moore MRN: [id number] Date of Birth: 08-15-1951  Transition of [facility]) CM/SW Contact:    Levie Ream, RN Phone Number: 02/28/2024, 12:02 PM  Clinical Narrative:                 Eldora Greet w/ pt in room; pt says she lives at home; she plans to return at d/c; pt identified POC Luretha Salmon (niece) [phone]; she will provide transportation for pt; pt verified insurance/PCP; she denied SDOH risks; pt does not have DME, HH services, or home oxygen; notified pt needs youth-sized walker; pt does not have agency preference; Zach at [employer] notified and DME will be delivered to pt's room; pt notified; no TOC needs.  Expected Discharge Plan: Home/Self Care Barriers to Discharge: No Barriers Identified   Patient Goals and CMS Choice Patient states their goals for this hospitalization and ongoing recovery are:: home CMS Medicare.gov Compare Post Acute Care list provided to:: Patient        Expected Discharge Plan and Services   Discharge Planning Services: CM Consult Post Acute Care Choice: Durable Medical Equipment Living arrangements for the past 2 months: Single Family Home Expected Discharge Date: 02/28/24               DME Arranged: Otho Blitz youth DME Agency: [employer] Date DME Agency Contacted: 02/28/24 Time DME Agency Contacted: 1200 Representative spoke with at DME Agency: Gladys Lamp HH Arranged: NA HH Agency: NA        Prior Living Arrangements/Services Living arrangements for the past 2 months: Single Family Home Lives with:: Self Patient language and need for interpreter reviewed:: Yes Do you feel safe going back to the place where you live?: Yes      Need for Family Participation in Patient Care: Yes (Comment) Care giver support system in place?: Yes (comment) Current home services:  (n/a) Criminal Activity/Legal Involvement Pertinent to Current Situation/Hospitalization: No  - Comment as needed  Activities of Daily Living   ADL Screening (condition at time of admission) Independently performs ADLs?: Yes (appropriate for developmental age) Is the patient deaf or have difficulty hearing?: No Does the patient have difficulty seeing, even when wearing glasses/contacts?: No Does the patient have difficulty concentrating, remembering, or making decisions?: No  Permission Sought/Granted Permission sought to share information with : Case Manager Permission granted to share information with : Yes, Verbal Permission Granted  Share Information with NAME: Case Manager     Permission granted to share info w Relationship: Luretha Salmon (niece) [phone]     Emotional Assessment Appearance:: Appears stated age Attitude/Demeanor/Rapport: Gracious Affect (typically observed): Accepting Orientation: : Oriented to Self, Oriented to Place, Oriented to  Time, Oriented to Situation Alcohol / Substance Use: Not Applicable Psych Involvement: No (comment)  Admission diagnosis:  Primary osteoarthritis of right knee [M17.11] S/P total knee arthroplasty, right [Z96.651] Patient Active Problem List   Diagnosis Date Noted   S/P total knee arthroplasty, right 02/27/2024   Coronary artery calcification seen on CT scan 04/05/2023   Primary osteoarthritis, 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/28/2021   Atypical chest pain 06/29/2021   Intractable abdominal pain 05/16/2021   Acute pyelonephritis 05/16/2021   Acute cystitis without hematuria 05/16/2021   Hydronephrosis of right kidney 05/16/2021   Obstructive uropathy 05/16/2021   Hypothyroidism 05/16/2021   Hyponatremia 05/16/2021   Obesity (BMI 30-39.9) 05/16/2021   Goals of care, counseling/discussion 03/01/2021   SIRS (systemic inflammatory response syndrome) (HCC) 01/13/2021  Pyelonephritis 01/13/2021   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   Genetic testing 01/27/2020   Family history of breast cancer    Family  history of ovarian cancer    Family history of non-Hodgkin's lymphoma    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   Port catheter in place 11/21/2016   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   Anal cancer (HCC) 11/03/2016   Cystitis 05/06/2015   Degenerative cervical disc 04/08/2015   Trigger point of neck 03/26/2015   Pain in joint, ankle and foot 04/23/2013   Visit for preventive health examination 02/13/2013   Encounter for routine gynecological examination 02/13/2013   Family history of breast cancer in first degree relative 02/13/2013   Rash and nonspecific skin eruption 02/13/2013   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   VITAMIN D DEFICIENCY 06/25/2008   Osteopenia 06/02/2008   PCP:  Reginal Capra, MD Pharmacy:   [facility] [phone] Jonette Nestle, [place] - [address] [place] [address] Phone: [phone] Fax: [phone]  [facility] - [place], [place] - [address] [place] [address] Phone: [phone] Fax: [phone]  [facility] [address] [place] [address] Phone: [phone] Fax: [phone]     Social Drivers of Health (SDOH) Social History: SDOH Screenings   Food Insecurity: No Food Insecurity (02/28/2024)  Housing: Low Risk  (02/28/2024)  Transportation Needs: No Transportation Needs (02/28/2024)  Utilities: Not At Risk (02/28/2024)  Alcohol Screen: Low Risk  (06/26/2023)  Depression (PHQ2-9): Low Risk  (01/03/2024)  Financial Resource Strain: Low Risk  (06/26/2023)  Physical Activity: Insufficiently Active (06/26/2023)  Social Connections: Moderately Integrated (02/28/2024)  Stress: Patient Declined (06/26/2023)  Tobacco Use: Medium Risk (02/27/2024)  Health Literacy: Adequate Health Literacy (01/03/2024)   SDOH Interventions: Food Insecurity Interventions: Intervention Not Indicated,  Inpatient TOC Housing Interventions: Intervention Not Indicated, Inpatient TOC Transportation Interventions: Intervention Not Indicated, Inpatient TOC Utilities Interventions: Intervention Not Indicated, Inpatient TOC   Readmission Risk Interventions     No data to display

## 2024-02-28 NOTE — Progress Notes (Addendum)
 Subjective: 1 Day Post-Op s/p Procedure(s): ARTHROPLASTY, KNEE, TOTAL   Patient is alert, oriented. Patient reports pain as so far well controlled. Voiding on own. Denies chest pain, SOB, Calf pain. No nausea/vomiting.  Worried about stair training today, she normally uses a hand rail an a cane at home and does not feel like she can use hand rails on both sides.   Objective:  PE: VITALS:   Vitals:   02/27/24 1831 02/27/24 2210 02/28/24 0122 02/28/24 0626  BP: 121/71 112/65 (!) 104/55 (!) 109/59  Pulse: (!) 59 74 68 71  Resp: 18 18 18 18   Temp: 98.4 F (36.9 C) (!) 97.5 F (36.4 C) (!) 97.5 F (36.4 C) (!) 97.5 F (36.4 C)  TempSrc: Tympanic Oral Oral Oral  SpO2: 100% 100% 100% 100%  Weight:      Height:        Sensation intact distally Intact pulses distally Dorsiflexion/Plantar flexion intact Incision: dressing C/D/I  LABS  Results for orders placed or performed during the hospital encounter of 02/27/24 (from the past 24 hours)  CBC     Status: Abnormal   Collection Time: 02/28/24  3:42 AM  Result Value Ref Range   WBC 5.2 4.0 - 10.5 K/uL   RBC 3.67 (L) 3.87 - 5.11 MIL/uL   Hemoglobin 10.6 (L) 12.0 - 15.0 g/dL   HCT 41.3 (L) 24.4 - 01.0 %   MCV 94.8 80.0 - 100.0 fL   MCH 28.9 26.0 - 34.0 pg   MCHC 30.5 30.0 - 36.0 g/dL   RDW 27.2 53.6 - 64.4 %   Platelets 156 150 - 400 K/uL   nRBC 0.0 0.0 - 0.2 %  Basic metabolic panel     Status: Abnormal   Collection Time: 02/28/24  3:42 AM  Result Value Ref Range   Sodium 134 (L) 135 - 145 mmol/L   Potassium 4.0 3.5 - 5.1 mmol/L   Chloride 105 98 - 111 mmol/L   CO2 23 22 - 32 mmol/L   Glucose, Bld 124 (H) 70 - 99 mg/dL   BUN 17 8 - 23 mg/dL   Creatinine, Ser 0.34 0.44 - 1.00 mg/dL   Calcium 8.7 (L) 8.9 - 10.3 mg/dL   GFR, Estimated >74 >25 mL/min   Anion gap 6 5 - 15   *Note: Due to a large number of results and/or encounters for the requested time period, some results have not been displayed. A complete set  of results can be found in Results Review.    DG Knee Right Port Result Date: 02/27/2024 CLINICAL DATA:  Status post knee arthroplasty EXAM: PORTABLE RIGHT KNEE - 2 VIEW COMPARISON:  None Available. FINDINGS: Changes of total knee arthroplasty with cemented femoral tibial component. Patellar button. Scattered soft tissue gas with small joint effusion. Air in the joint space as well. Expected alignment. Severe osteopenia. Imaging was obtained to aid in treatment. IMPRESSION: Acute surgical changes of total knee arthroplasty. Electronically Signed   By: Adrianna Horde M.D.   On: 02/27/2024 15:04    Assessment/Plan: Principal Problem:   S/P total knee arthroplasty, right    1 Day Post-Op s/p Procedure(s): ARTHROPLASTY, KNEE, TOTAL  Continue incentive spirometry Weightbearing: WBAT RLE, up with therapy Insicional and dressing care: Reinforce dressings as needed VTE prophylaxis: Eliquis 2.5 mg BID x 30 days Pain control: continue current regimen Follow - up plan: 2 weeks with Dr. Agatha Horsfall Dispo: pending progress with stair training today, hopeful to d/c home this afternoon  From a stair training standpoint, if she can manage better with a cane in one hand and railing in the other, we are ok with her trying this today as that is likely what she will do when she gets home.    Contact information:   Hurshel Maidens, Kirby Peoples ZOXWRUEA 8-5  After hours and holidays please check [email or web address] for group call information for [facility]  Abraham Hoffmann 02/28/2024, 8:18 AM

## 2024-02-28 NOTE — Care Management Obs Status (Signed)
 MEDICARE OBSERVATION STATUS NOTIFICATION   Patient Details  Name: Ashley Moore MRN: [id number] Date of Birth: May 10, 1951   Medicare Observation Status Notification Given:  Yes    Levie Ream, RN 02/28/2024, 10:57 AM

## 2024-02-28 NOTE — Discharge Summary (Signed)
 Discharge Summary  Patient ID: Ashley Moore MRN: [id number] DOB/AGE: 05-25-1951 73 y.o.  Admit date: 02/27/2024 Discharge date: 02/28/2024  Admission Diagnoses:  S/P total knee arthroplasty, right  Discharge Diagnoses:  Principal Problem:   S/P total knee arthroplasty, right   Past Medical History:  Diagnosis Date   Arthritis    Bowen's disease    excised 1992   Chronic low back pain    Chronic radiation cystitis    DDD (degenerative disc disease), cervical    Family history of breast cancer    Family history of lung cancer    Family history of non-Hodgkin's lymphoma    Family history of ovarian cancer    Headache    Hx of varicella    Hydronephrosis of right kidney    urologist--- dr Secundino Dach,  malignant,  treated with ureter stent   Hypothyroidism    followed by pcp   Lower urinary tract symptoms (LUTS)    01-12-2021  per pt treated with accupuncture at dr Secundino Dach office   Lymphedema of both lower extremities    PICC (peripherally inserted central catheter) in place 01/11/2021   placed at [facility] in [place], [place] for IV antibiotics   Positive blood culture 01/08/2021   Klebsiella Pneumaniae,  treated with daily IV antibiotic   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[facility]) oncologist--- dr Scherrie Curt   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,  invasive SCC , completed chemo/ radiation 12-23-2016;  recurrent metastatsis retroperitoneal lymphdenopathy,  completed radiation 04-13-2020 residual right ureteral uropathy obstruction   Retroperitoneal lymphadenopathy    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 female who was admitted 02/27/2024 with a diagnosis of S/P total knee arthroplasty, right and went to the operating room on 02/27/2024 and underwent the above named procedures.    She was given perioperative antibiotics:  Anti-infectives (From admission, onward)    Start     Dose/Rate Route Frequency Ordered Stop   02/27/24 1700  ceFAZolin (ANCEF) IVPB 2g/100 mL premix        2 g 200 mL/hr over 30 Minutes Intravenous Every 6 hours 02/27/24 1500 02/27/24 2307   02/27/24 0930  ceFAZolin (ANCEF) IVPB 2g/100 mL premix        2 g 200 mL/hr over 30 Minutes Intravenous On call to O.R. 02/27/24 8657 02/27/24 1106     .  She was given sequential compression devices, early ambulation, and Eliquis for DVT prophylaxis.  She benefited maximally from the hospital stay and there were no complications.    Recent vital signs:  Vitals:   02/28/24 0122 02/28/24 0626  BP: (!) 104/55 (!) 109/59  Pulse: 68 71  Resp: 18 18  Temp: (!) 97.5 F (36.4 C) (!) 97.5 F (36.4 C)  SpO2: 100% 100%    Recent laboratory studies:  Lab Results  Component Value Date   HGB 10.6 (L) 02/28/2024   HGB 11.8 (L) 02/15/2024   HGB 12.0 07/27/2023   Lab Results  Component Value Date   WBC 5.2 02/28/2024   PLT 156 02/28/2024   Lab  Results  Component Value Date   INR 1.2 05/16/2021   Lab Results  Component Value Date   NA 134 (L) 02/28/2024   K 4.0 02/28/2024   CL 105 02/28/2024   CO2 23 02/28/2024   BUN 17 02/28/2024   CREATININE 0.74 02/28/2024   GLUCOSE 124 (H) 02/28/2024    Discharge Medications:   Allergies as of 02/28/2024       Reactions   Sulfamethoxazole -trimethoprim  Other (See Comments)   C/o  abd pain and constipation   Benadryl [diphenhydramine] Other (See Comments)   Tingle all over    Melatonin    Tingle all over    Penicillins Rash   Did it involve swelling of the face/tongue/throat, SOB, or low BP? N Did it involve  sudden or severe rash/hives, skin peeling, or any reaction on the inside of your mouth or nose? Y Did you need to seek medical attention at a hospital or doctor's office? N When did it last happen? Almost 50 years Ago      If all above answers are NO, may proceed with cephalosporin use. Tolerated Ancef 02/27/24        Medication List     STOP taking these medications    naproxen sodium 220 MG tablet Commonly known as: ALEVE       TAKE these medications    acetaminophen  650 MG CR tablet Commonly known as: TYLENOL  Take 2 tablets (1,300 mg total) by mouth every 8 (eight) hours as needed for pain. What changed:  when to take this reasons to take this   apixaban 2.5 MG Tabs tablet Commonly known as: Eliquis Take 1 tablet (2.5 mg total) by mouth 2 (two) times daily.   azelastine  0.1 % nasal spray Commonly known as: ASTELIN  Place 2 sprays into both nostrils 2 (two) times daily. Use in each nostril as directed   CAL MAG ZINC +D3 PO Take 1 tablet by mouth daily.   castor oil liquid Take 1 mL by mouth daily. Applied to eyelids   COLLAGEN PO Take 1 Scoop by mouth in the morning. Gundry MD Phyto Collagen Complex   conjugated estrogens  0.625 MG/GM vaginal cream Commonly known as: PREMARIN  Place 1 Applicatorful vaginally at bedtime.   CRANBERRY PO Take 1 capsule by mouth daily. Organic Cranberry Concentrate by [employer]   levothyroxine  75 MCG tablet Commonly known as: SYNTHROID  TAKE 1 TABLET EVERY DAY   LORazepam  0.5 MG tablet Commonly known as: ATIVAN  Take 1 tablet (0.5 mg total) by mouth daily as needed for anxiety (as needed).   methocarbamol 500 MG tablet Commonly known as: ROBAXIN Take 1 tablet (500 mg total) by mouth every 8 (eight) hours as needed for muscle spasms.   Myrbetriq  50 MG Tb24 tablet Generic drug: mirabegron  ER Take 50 mg by mouth in the morning.   olive oil external oil 1 oz daily. By mouth for constipation.   ondansetron  4 MG  tablet Commonly known as: Zofran  Take 1 tablet (4 mg total) by mouth every 8 (eight) hours as needed for nausea or vomiting.   oxyCODONE  5 MG immediate release tablet Commonly known as: Roxicodone  Take 1 tablet (5 mg total) by mouth every 4 (four) hours as needed for severe pain (pain score 7-10).   Pataday 0.2 % Soln Generic drug: Olopatadine HCl Place 1 drop into both eyes in the morning.   PRENATAL PO Take 1 capsule by mouth daily. [employer] Prenatal Vitamin   sennosides-docusate sodium  8.6-50 MG tablet Commonly  known as: SENOKOT-S Take 2 tablets by mouth daily.   Systane Hydration PF 0.4-0.3 % Soln Generic drug: Polyethyl Glyc-Propyl Glyc PF Apply to eye.   Turmeric Curcumin Caps Take 1 capsule by mouth daily.   VITAMIN D3 PO Take 1 tablet by mouth daily.        Diagnostic Studies: DG Knee Right Port Result Date: 02/27/2024 CLINICAL DATA:  Status post knee arthroplasty EXAM: PORTABLE RIGHT KNEE - 2 VIEW COMPARISON:  None Available. FINDINGS: Changes of total knee arthroplasty with cemented femoral tibial component. Patellar button. Scattered soft tissue gas with small joint effusion. Air in the joint space as well. Expected alignment. Severe osteopenia. Imaging was obtained to aid in treatment. IMPRESSION: Acute surgical changes of total knee arthroplasty. Electronically Signed   By: Adrianna Horde M.D.   On: 02/27/2024 15:04    Disposition: Discharge disposition: 01-Home or Self Care          Follow-up Information     Osa Blase, MD. Go on 03/13/2024.   Specialty: Orthopedic Surgery Why: your appointment has been scheduled for 3:45. Contact information: [address] [place] [address] [phone]         [facility] Follow up.   Why: HHPT will provide 6 home visits prior to starting outpatient physical therapy        [facility] Go on 03/13/2024.   Why: Your outpatient  physical therapy has been scheduled. they office will call you with a time Contact information: [facility] [address] [place] [address] [phone]                  Signed: Johny Nap 02/28/2024, 8:24 AM

## 2024-02-28 NOTE — Progress Notes (Signed)
Nurse reviewed discharge instructions with pt.  Pt verbalized understanding of discharge instructions, follow up appointments and new medications.  No concerns at time of discharge. 

## 2024-02-28 NOTE — Progress Notes (Signed)
Physical Therapy Treatment Patient Details Name: Ashley Moore MRN: [id number] DOB: 1951-01-13 Today's Date: 02/28/2024   History of Present Illness 73 yo female presents to therapy s/p R TKA on 02/27/2024 due to failure of conservative measures. Pt PMH includes but is not limited to: metastatic anal cancer, right kidney hydronephrosis, chronic low back pain, chronic radiation cystitis, hypothyroidism, bowens disease and lymphedema.    PT Comments  POD # 1 am session AxO x 3 pleasant and willing. Did have HIGH anxiety during stair training. Assisted OOB to amb in bathroom.  Instructed on safety using walker with turns and back steps.  Pt nervous.  Assisted with amb in hallway to practice stairs.  General Gait Details: Pt tolerated amb from bed to the bathroom then from bathroom to stairs with reports 4/10 pain.  25% VC's on safety with turns and proper walker to self distance.General stair comments: Consulted with evaluating LPT reguarding Pt's stair performance yesterday.  Performed and instructed to use LEFT rail and RIGHT crutch due to bad R shoulder.  75% VC's on proper sequencing on proper tech.  Pt required MAX encouragement and coaxing due to HIGH anxiety.  Will repeat stairs when Niece arrives. Will order crutches (only for stairs). Then returned to room to perform some TE's following HEP handout.  Instructed on proper tech, freq as well as use of ICE.   Will see Pt again this afternoon when Niece arrives for family education.   If plan is discharge home, recommend the following: A little help with walking and/or transfers;A little help with bathing/dressing/bathroom;Assistance with cooking/housework;Assist for transportation;Help with stairs or ramp for entrance   Can travel by private vehicle        Equipment Recommendations  Rolling walker (2 wheels) (YOUTH RW)    Recommendations for Other Services       Precautions / Restrictions Precautions Precautions:  Fall;Knee Precaution/Restrictions Comments: no pillow under knee Restrictions Weight Bearing Restrictions Per Provider Order: No RLE Weight Bearing Per Provider Order: Weight bearing as tolerated     Mobility  Bed Mobility Overal bed mobility: Needs Assistance Bed Mobility: Supine to Sit     Supine to sit: Supervision     General bed mobility comments: demonstarted and instructed how to use a belt to self assist LE off bed.  Required increased time.    Transfers Overall transfer level: Needs assistance Equipment used: Rolling walker (2 wheels) Transfers: Sit to/from Stand Sit to Stand: Contact guard assist           General transfer comment: 25% VC's on proper hand placement and safety with turns.  Also assisted with a toilet transfer.    Ambulation/Gait Ambulation/Gait assistance: Supervision, Contact guard assist Gait Distance (Feet): 24 Feet Assistive device: Rolling walker (2 wheels) Gait Pattern/deviations: Step-to pattern, Decreased stance time - right, Antalgic, Trunk flexed Gait velocity: decreased     General Gait Details: Pt tolerated amb from bed to the bathroom then from bathroom to stairs with reports 4/10 pain.  25% VC's on safety with turns and proper walker to self distance.   Stairs Stairs: Yes Stairs assistance: Min assist Stair Management: One rail Left, Step to pattern, Forwards, With crutches Number of Stairs: 2 General stair comments: Consulted with evaluating LPT reguarding Pt's stair performance yesterday.  Performed and instructed to use LEFT rail and RIGHT crutch due to bad R shoulder.  75% VC's on proper sequencing on proper tech.  Pt required MAX encouragement and coaxing due to HIGH anxiety.  Will repeat stairs when Niece arrives.   Wheelchair Mobility     Tilt Bed    Modified Rankin (Stroke Patients Only)       Balance                                            Communication  Communication Communication: No apparent difficulties  Cognition Arousal: Alert Behavior During Therapy: WFL for tasks assessed/performed   PT - Cognitive impairments: No apparent impairments                       PT - Cognition Comments: AxO x 3 pleasant and willing.  Did have HIGH anxiety during stair training. Following commands: Intact      Cueing Cueing Techniques: Verbal cues  Exercises  Total Knee Replacement TE's following HEP handout 10 reps B LE ankle pumps 05 reps towel squeezes 05 reps knee presses 05 reps heel slides  05 reps SAQ's 05 reps SLR's 05 reps ABD Educated on use of gait belt to assist with TE's Followed by ICE     General Comments        Pertinent Vitals/Pain Pain Assessment Pain Assessment: 0-10 Pain Score: 3  Pain Location: R LE and knee Pain Descriptors / Indicators: Aching, Constant, Discomfort, Dull, Grimacing, Operative site guarding Pain Intervention(s): Monitored during session, Premedicated before session, Repositioned, Ice applied    Home Living                          Prior Function            PT Goals (current goals can now be found in the care plan section) Progress towards PT goals: Progressing toward goals    Frequency    7X/week      PT Plan      Co-evaluation              AM-PAC PT 6 Clicks Mobility   Outcome Measure  Help needed turning from your back to your side while in a flat bed without using bedrails?: A Little Help needed moving from lying on your back to sitting on the side of a flat bed without using bedrails?: A Little Help needed moving to and from a bed to a chair (including a wheelchair)?: A Little Help needed standing up from a chair using your arms (e.g., wheelchair or bedside chair)?: A Little Help needed to walk in hospital room?: A Little Help needed climbing 3-5 steps with a railing? : A Lot 6 Click Score: 17    End of Session Equipment Utilized During  Treatment: Gait belt Activity Tolerance: Patient tolerated treatment well Patient left: in chair;with call bell/phone within reach Nurse Communication: Mobility status PT Visit Diagnosis: Unsteadiness on feet (R26.81);Other abnormalities of gait and mobility (R26.89);Muscle weakness (generalized) (M62.81);Difficulty in walking, not elsewhere classified (R26.2);Pain Pain - Right/Left: Right Pain - part of body: Leg;Knee     Time: 1005-1045 PT Time Calculation (min) (ACUTE ONLY): 40 min  Charges:    $Gait Training: 8-22 mins $Therapeutic Activity: 23-37 mins PT General Charges $$ ACUTE PT VISIT: 1 Visit                     Bess Broody  PTA Acute  Rehabilitation Services Office M-F  [phone]    

## 2024-02-28 NOTE — Progress Notes (Addendum)
 Physical Therapy Treatment Patient Details Name: Ashley Moore MRN: [id number] DOB: 1950-10-25 Today's Date: 02/28/2024   History of Present Illness 73 yo female presents to therapy s/p R TKA on 02/27/2024 due to failure of conservative measures. Pt PMH includes but is not limited to: metastatic anal cancer, right kidney hydronephrosis, chronic low back pain, chronic radiation cystitis, hypothyroidism, bowens disease and lymphedema.    PT Comments  POD # 1 pm session Niece present during session for Family Training.  Niece is a Lawyer.  Had her hands on assist Pt to the bathroom as well as in bathroom needs.  Assisted with amb in hallway.  Practiced stairs with Niece using safety belt.  Pt's fear/anxiety much improved.  She was able to navigate 2 steps forward using RIGHT rail and LEFT crutch.  Pt was able to recall correct sequencing up with there good and down with the bad.  Then returned to room to perform some TE's following HEP handout.  Instructed on proper tech, freq as well as use of ICE.   Addressed all mobility questions, discussed appropriate activity, educated on use of ICE.  Pt ready for D/C to home.    If plan is discharge home, recommend the following: A little help with walking and/or transfers;A little help with bathing/dressing/bathroom;Assistance with cooking/housework;Assist for transportation;Help with stairs or ramp for entrance   Can travel by private vehicle        Equipment Recommendations  Rolling walker (2 wheels) (YOUTH RW)  Crutches (ONE for stairs only)  Recommendations for Other Services       Precautions / Restrictions Precautions Precautions: Fall;Knee Precaution/Restrictions Comments: no pillow under knee Restrictions Weight Bearing Restrictions Per Provider Order: No RLE Weight Bearing Per Provider Order: Weight bearing as tolerated     Mobility    Transfers Overall transfer level: Needs assistance Equipment used: Rolling walker (2  wheels) Transfers: Sit to/from Stand Sit to Stand: Contact guard assist           General transfer comment: 25% VC's on proper hand placement and safety with turns.  Also assisted with a toilet transfer.    Ambulation/Gait Ambulation/Gait assistance: Supervision, Contact guard assist Gait Distance (Feet): 24 Feet Assistive device: Rolling walker (2 wheels) Gait Pattern/deviations: Step-to pattern, Decreased stance time - right, Antalgic, Trunk flexed Gait velocity: decreased     General Gait Details: Pt tolerated amb from bed to the bathroom then from bathroom to stairs with reports 4/10 pain.  25% VC's on safety with turns and proper walker to self distance.   Stairs Stairs: Yes Stairs assistance: Min assist Stair Management: One rail Left, Step to pattern, Forwards, With crutches Number of Stairs: 2 General stair comments: Consulted with evaluating LPT reguarding Pt's stair performance yesterday.  Performed and instructed to use LEFT rail and RIGHT crutch due to bad R shoulder.  75% VC's on proper sequencing on proper tech.  Pt required MAX encouragement and coaxing due to HIGH anxiety.  Will repeat stairs when Niece arrives.   Wheelchair Mobility     Tilt Bed    Modified Rankin (Stroke Patients Only)       Balance                                            Communication Communication Communication: No apparent difficulties  Cognition Arousal: Alert Behavior During Therapy: WFL for tasks assessed/performed  PT - Cognitive impairments: No apparent impairments                       PT - Cognition Comments: AxO x 3 pleasant and willing.  Did have HIGH anxiety during stair training. Following commands: Intact      Cueing Cueing Techniques: Verbal cues  Exercises  Total Knee Replacement TE's following HEP handout 10 reps B LE ankle pumps 05 reps towel squeezes 05 reps knee presses 05 reps heel slides  05 reps SAQ's 05 reps  SLR's 05 reps ABD Educated on use of gait belt to assist with TE's Followed by ICE     General Comments        Pertinent Vitals/Pain Pain Assessment Pain Assessment: 0-10 Pain Score: 3  Pain Location: R LE and knee Pain Descriptors / Indicators: Aching, Constant, Discomfort, Dull, Grimacing, Operative site guarding Pain Intervention(s): Monitored during session, Premedicated before session, Repositioned, Ice applied    Home Living                          Prior Function            PT Goals (current goals can now be found in the care plan section) Progress towards PT goals: Progressing toward goals    Frequency    7X/week      PT Plan      Co-evaluation              AM-PAC PT 6 Clicks Mobility   Outcome Measure  Help needed turning from your back to your side while in a flat bed without using bedrails?: A Little Help needed moving from lying on your back to sitting on the side of a flat bed without using bedrails?: A Little Help needed moving to and from a bed to a chair (including a wheelchair)?: A Little Help needed standing up from a chair using your arms (e.g., wheelchair or bedside chair)?: A Little Help needed to walk in hospital room?: A Little Help needed climbing 3-5 steps with a railing? : A Lot 6 Click Score: 17    End of Session Equipment Utilized During Treatment: Gait belt Activity Tolerance: Patient tolerated treatment well Patient left: in chair;with call bell/phone within reach Nurse Communication: Mobility status PT Visit Diagnosis: Unsteadiness on feet (R26.81);Other abnormalities of gait and mobility (R26.89);Muscle weakness (generalized) (M62.81);Difficulty in walking, not elsewhere classified (R26.2);Pain Pain - Right/Left: Right Pain - part of body: Leg;Knee     Time: 1410-1450 PT Time Calculation (min) (ACUTE ONLY): 40 min  Charges:    $Gait Training: 8-22 mins $Therapeutic Exercise: 8-22 mins $Therapeutic  Activity: 8-22 mins PT General Charges $$ ACUTE PT VISIT: 1 Visit                     Bess Broody  PTA Acute  Rehabilitation Services Office M-F          [phone]

## 2024-02-28 NOTE — Progress Notes (Signed)
 Orthopedic Tech Progress Note Patient Details:  Ashley Moore Oct 16, 1950 [id number]  Ortho Devices Type of Ortho Device: Crutches Ortho Device/Splint Interventions: Ordered, Application, Adjustment   Post Interventions Patient Tolerated: Well, Ambulated well Instructions Provided: Adjustment of device, Care of device  Leodis Rainwater 02/28/2024, 3:51 PM

## 2024-03-01 ENCOUNTER — Encounter: Payer: Self-pay | Admitting: Physician Assistant

## 2024-03-01 DIAGNOSIS — Z471 Aftercare following joint replacement surgery: Secondary | ICD-10-CM | POA: Diagnosis not present

## 2024-03-01 DIAGNOSIS — G629 Polyneuropathy, unspecified: Secondary | ICD-10-CM | POA: Diagnosis not present

## 2024-03-01 DIAGNOSIS — E039 Hypothyroidism, unspecified: Secondary | ICD-10-CM | POA: Diagnosis not present

## 2024-03-01 DIAGNOSIS — I890 Lymphedema, not elsewhere classified: Secondary | ICD-10-CM | POA: Diagnosis not present

## 2024-03-01 DIAGNOSIS — M069 Rheumatoid arthritis, unspecified: Secondary | ICD-10-CM | POA: Diagnosis not present

## 2024-03-01 DIAGNOSIS — Z85048 Personal history of other malignant neoplasm of rectum, rectosigmoid junction, and anus: Secondary | ICD-10-CM | POA: Diagnosis not present

## 2024-03-01 DIAGNOSIS — Z7901 Long term (current) use of anticoagulants: Secondary | ICD-10-CM | POA: Diagnosis not present

## 2024-03-01 DIAGNOSIS — Z96651 Presence of right artificial knee joint: Secondary | ICD-10-CM | POA: Diagnosis not present

## 2024-03-05 DIAGNOSIS — I890 Lymphedema, not elsewhere classified: Secondary | ICD-10-CM | POA: Diagnosis not present

## 2024-03-05 DIAGNOSIS — Z7901 Long term (current) use of anticoagulants: Secondary | ICD-10-CM | POA: Diagnosis not present

## 2024-03-05 DIAGNOSIS — G629 Polyneuropathy, unspecified: Secondary | ICD-10-CM | POA: Diagnosis not present

## 2024-03-05 DIAGNOSIS — Z96651 Presence of right artificial knee joint: Secondary | ICD-10-CM | POA: Diagnosis not present

## 2024-03-05 DIAGNOSIS — M069 Rheumatoid arthritis, unspecified: Secondary | ICD-10-CM | POA: Diagnosis not present

## 2024-03-05 DIAGNOSIS — Z471 Aftercare following joint replacement surgery: Secondary | ICD-10-CM | POA: Diagnosis not present

## 2024-03-05 DIAGNOSIS — E039 Hypothyroidism, unspecified: Secondary | ICD-10-CM | POA: Diagnosis not present

## 2024-03-05 DIAGNOSIS — Z85048 Personal history of other malignant neoplasm of rectum, rectosigmoid junction, and anus: Secondary | ICD-10-CM | POA: Diagnosis not present

## 2024-03-06 DIAGNOSIS — Z85048 Personal history of other malignant neoplasm of rectum, rectosigmoid junction, and anus: Secondary | ICD-10-CM | POA: Diagnosis not present

## 2024-03-06 DIAGNOSIS — M069 Rheumatoid arthritis, unspecified: Secondary | ICD-10-CM | POA: Diagnosis not present

## 2024-03-06 DIAGNOSIS — Z7901 Long term (current) use of anticoagulants: Secondary | ICD-10-CM | POA: Diagnosis not present

## 2024-03-06 DIAGNOSIS — G629 Polyneuropathy, unspecified: Secondary | ICD-10-CM | POA: Diagnosis not present

## 2024-03-06 DIAGNOSIS — I890 Lymphedema, not elsewhere classified: Secondary | ICD-10-CM | POA: Diagnosis not present

## 2024-03-06 DIAGNOSIS — E039 Hypothyroidism, unspecified: Secondary | ICD-10-CM | POA: Diagnosis not present

## 2024-03-06 DIAGNOSIS — Z471 Aftercare following joint replacement surgery: Secondary | ICD-10-CM | POA: Diagnosis not present

## 2024-03-06 DIAGNOSIS — Z96651 Presence of right artificial knee joint: Secondary | ICD-10-CM | POA: Diagnosis not present

## 2024-03-07 DIAGNOSIS — G629 Polyneuropathy, unspecified: Secondary | ICD-10-CM | POA: Diagnosis not present

## 2024-03-07 DIAGNOSIS — Z7901 Long term (current) use of anticoagulants: Secondary | ICD-10-CM | POA: Diagnosis not present

## 2024-03-07 DIAGNOSIS — Z471 Aftercare following joint replacement surgery: Secondary | ICD-10-CM | POA: Diagnosis not present

## 2024-03-07 DIAGNOSIS — Z96651 Presence of right artificial knee joint: Secondary | ICD-10-CM | POA: Diagnosis not present

## 2024-03-07 DIAGNOSIS — Z85048 Personal history of other malignant neoplasm of rectum, rectosigmoid junction, and anus: Secondary | ICD-10-CM | POA: Diagnosis not present

## 2024-03-07 DIAGNOSIS — E039 Hypothyroidism, unspecified: Secondary | ICD-10-CM | POA: Diagnosis not present

## 2024-03-07 DIAGNOSIS — I890 Lymphedema, not elsewhere classified: Secondary | ICD-10-CM | POA: Diagnosis not present

## 2024-03-07 DIAGNOSIS — M069 Rheumatoid arthritis, unspecified: Secondary | ICD-10-CM | POA: Diagnosis not present

## 2024-03-08 ENCOUNTER — Other Ambulatory Visit: Payer: Self-pay

## 2024-03-08 DIAGNOSIS — M069 Rheumatoid arthritis, unspecified: Secondary | ICD-10-CM | POA: Diagnosis not present

## 2024-03-08 DIAGNOSIS — I890 Lymphedema, not elsewhere classified: Secondary | ICD-10-CM | POA: Diagnosis not present

## 2024-03-08 DIAGNOSIS — Z85048 Personal history of other malignant neoplasm of rectum, rectosigmoid junction, and anus: Secondary | ICD-10-CM | POA: Diagnosis not present

## 2024-03-08 DIAGNOSIS — G629 Polyneuropathy, unspecified: Secondary | ICD-10-CM | POA: Diagnosis not present

## 2024-03-08 DIAGNOSIS — E039 Hypothyroidism, unspecified: Secondary | ICD-10-CM | POA: Diagnosis not present

## 2024-03-08 DIAGNOSIS — Z7901 Long term (current) use of anticoagulants: Secondary | ICD-10-CM | POA: Diagnosis not present

## 2024-03-08 DIAGNOSIS — Z471 Aftercare following joint replacement surgery: Secondary | ICD-10-CM | POA: Diagnosis not present

## 2024-03-08 DIAGNOSIS — Z96651 Presence of right artificial knee joint: Secondary | ICD-10-CM | POA: Diagnosis not present

## 2024-03-12 DIAGNOSIS — Z7901 Long term (current) use of anticoagulants: Secondary | ICD-10-CM | POA: Diagnosis not present

## 2024-03-12 DIAGNOSIS — G629 Polyneuropathy, unspecified: Secondary | ICD-10-CM | POA: Diagnosis not present

## 2024-03-12 DIAGNOSIS — M069 Rheumatoid arthritis, unspecified: Secondary | ICD-10-CM | POA: Diagnosis not present

## 2024-03-12 DIAGNOSIS — Z85048 Personal history of other malignant neoplasm of rectum, rectosigmoid junction, and anus: Secondary | ICD-10-CM | POA: Diagnosis not present

## 2024-03-12 DIAGNOSIS — Z471 Aftercare following joint replacement surgery: Secondary | ICD-10-CM | POA: Diagnosis not present

## 2024-03-12 DIAGNOSIS — Z96651 Presence of right artificial knee joint: Secondary | ICD-10-CM | POA: Diagnosis not present

## 2024-03-12 DIAGNOSIS — I890 Lymphedema, not elsewhere classified: Secondary | ICD-10-CM | POA: Diagnosis not present

## 2024-03-12 DIAGNOSIS — E039 Hypothyroidism, unspecified: Secondary | ICD-10-CM | POA: Diagnosis not present

## 2024-03-13 DIAGNOSIS — Z96651 Presence of right artificial knee joint: Secondary | ICD-10-CM | POA: Diagnosis not present

## 2024-03-13 DIAGNOSIS — M1711 Unilateral primary osteoarthritis, right knee: Secondary | ICD-10-CM | POA: Diagnosis not present

## 2024-03-14 ENCOUNTER — Other Ambulatory Visit: Payer: Self-pay

## 2024-03-14 ENCOUNTER — Emergency Department (HOSPITAL_BASED_OUTPATIENT_CLINIC_OR_DEPARTMENT_OTHER)
Admission: EM | Admit: 2024-03-14 | Discharge: 2024-03-14 | Disposition: A | Discharge status: Home / Self Care | Attending: Emergency Medicine | Admitting: Emergency Medicine

## 2024-03-14 DIAGNOSIS — R319 Hematuria, unspecified: Secondary | ICD-10-CM | POA: Diagnosis present

## 2024-03-14 DIAGNOSIS — N3001 Acute cystitis with hematuria: Secondary | ICD-10-CM | POA: Diagnosis not present

## 2024-03-14 DIAGNOSIS — C20 Malignant neoplasm of rectum: Secondary | ICD-10-CM | POA: Insufficient documentation

## 2024-03-14 DIAGNOSIS — Z7901 Long term (current) use of anticoagulants: Secondary | ICD-10-CM | POA: Diagnosis not present

## 2024-03-14 LAB — CBC
HCT: 33.1 % — ABNORMAL LOW (ref 36.0–46.0)
Hemoglobin: 10.3 g/dL — ABNORMAL LOW (ref 12.0–15.0)
MCH: 28.8 pg (ref 26.0–34.0)
MCHC: 31.1 g/dL (ref 30.0–36.0)
MCV: 92.5 fL (ref 80.0–100.0)
Platelets: 240 10*3/uL (ref 150–400)
RBC: 3.58 MIL/uL — ABNORMAL LOW (ref 3.87–5.11)
RDW: 15.1 % (ref 11.5–15.5)
WBC: 4.5 10*3/uL (ref 4.0–10.5)
nRBC: 0 % (ref 0.0–0.2)

## 2024-03-14 LAB — BASIC METABOLIC PANEL WITH GFR
Anion gap: 8 (ref 5–15)
BUN: 19 mg/dL (ref 8–23)
CO2: 24 mmol/L (ref 22–32)
Calcium: 9.3 mg/dL (ref 8.9–10.3)
Chloride: 102 mmol/L (ref 98–111)
Creatinine, Ser: 0.81 mg/dL (ref 0.44–1.00)
GFR, Estimated: >60 mL/min (ref >60)
Glucose, Bld: 79 mg/dL (ref 70–99)
Potassium: 4 mmol/L (ref 3.5–5.1)
Sodium: 135 mmol/L (ref 135–145)

## 2024-03-14 LAB — URINALYSIS, ROUTINE W REFLEX MICROSCOPIC
Bacteria, UA: NONE SEEN
Bilirubin Urine: NEGATIVE
Glucose, UA: NEGATIVE mg/dL
Ketones, ur: NEGATIVE mg/dL
Nitrite: NEGATIVE
RBC / HPF: >50 RBC/hpf (ref 0–5)
Specific Gravity, Urine: 1.012 (ref 1.005–1.030)
WBC, UA: >50 WBC/hpf (ref 0–5)
pH: 6 (ref 5.0–8.0)

## 2024-03-14 MED ORDER — CEPHALEXIN 500 MG PO CAPS: 500.0000 mg | ORAL_CAPSULE | Freq: Four times a day (QID) | ORAL | 0 refills | Status: DC

## 2024-03-14 MED ORDER — CEPHALEXIN 250 MG PO CAPS
500.0000 mg | ORAL_CAPSULE | Freq: Once | ORAL | Status: AC
  Administered 2024-03-14: 500 mg via ORAL
  Filled 2024-03-14: qty 2

## 2024-03-14 NOTE — ED Triage Notes (Signed)
 Pt caox4 c/o hematuria consisently with urination since yesterday. Pt denies pain, fever, increased frequency, urinary retention. 2 weeks post op from knee replacement, which is when she was started on Eliquis .

## 2024-03-14 NOTE — ED Notes (Signed)
Spoke with lab about urine.

## 2024-03-14 NOTE — ED Notes (Signed)
 Pt d/c instructions, medications, and follow-up care reviewed with pt. Pt verbalized understanding and had no further questions at time of d/c. Pt CA&Ox4, ambulatory, and in NAD at time of d/c

## 2024-03-14 NOTE — ED Notes (Signed)
 Spoke with lab about urine culture add on.

## 2024-03-14 NOTE — Discharge Instructions (Addendum)
 Follow-up with primary care.  Return to emergency room with new or worsening symptoms.  If blood in the urine continues please follow-up with urology.  Return to ER with new or worsening symptoms.

## 2024-03-14 NOTE — ED Provider Notes (Signed)
 [place] EMERGENCY DEPARTMENT AT [facility] Provider Note   CSN: [id number] Arrival date & time: 03/14/24  1105     Patient presents with: Hematuria   Ashley Moore is a 73 y.o. female.  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  She denies any fever.  Does have mild suprapubic pain.  No flank or back pain.  No chest pain or shortness of breath.  Patient is on Eliquis .    Hematuria       Prior to Admission medications   Medication Sig Start Date End Date Taking? Authorizing Provider  traMADol  (ULTRAM ) 50 MG tablet Take 50 mg by mouth 3 (three) times daily as needed. 03/13/24  Yes [provider]  acetaminophen  (TYLENOL ) 650 MG CR tablet Take 2 tablets (1,300 mg total) by mouth every 8 (eight) hours as needed for pain. 02/27/24   Brown, Blaine K, PA-C  apixaban  (ELIQUIS ) 2.5 MG TABS tablet Take 1 tablet (2.5 mg total) by mouth 2 (two) times daily. 02/27/24   Brown, Blaine K, PA-C  azelastine  (ASTELIN ) 0.1 % nasal spray Place 2 sprays into both nostrils 2 (two) times daily. Use in each nostril as directed Patient not taking: Reported on 02/07/2024 10/31/23   Panosh, Apolinar POUR, MD  castor oil liquid Take 1 mL by mouth daily. Applied to eyelids    [provider]  Cholecalciferol (VITAMIN D3 PO) Take 1 tablet by mouth daily.    [provider]  Collagen-Vitamin C-Biotin (COLLAGEN PO) Take 1 Scoop by mouth in the morning. Gundry MD Phyto Collagen Complex    [provider]  conjugated estrogens  (PREMARIN ) vaginal cream Place 1 Applicatorful vaginally at bedtime.    [provider]  CRANBERRY PO Take 1 capsule by mouth daily. Organic Cranberry Concentrate by [employer]    [provider]  levothyroxine  (SYNTHROID ) 75 MCG tablet TAKE 1 TABLET EVERY DAY 10/31/23   Panosh, Wanda K, MD  LORazepam   (ATIVAN ) 0.5 MG tablet Take 1 tablet (0.5 mg total) by mouth daily as needed for anxiety (as needed). 01/23/24   Debby Olam POUR, NP  methocarbamol  (ROBAXIN ) 500 MG tablet Take 1 tablet (500 mg total) by mouth every 8 (eight) hours as needed for muscle spasms. 02/27/24   Brown, Blaine K, PA-C  Misc Natural Products (TURMERIC CURCUMIN) CAPS Take 1 capsule by mouth daily.    [provider]  Multiple Minerals-Vitamins (CAL MAG ZINC +D3 PO) Take 1 tablet by mouth daily.    [provider]  MYRBETRIQ  50 MG TB24 tablet Take 50 mg by mouth in the morning. 01/19/21   [provider]  olive oil external oil 1 oz daily. By mouth for constipation.    [provider]  Olopatadine  HCl (PATADAY ) 0.2 % SOLN Place 1 drop into both eyes in the morning.    [provider]  ondansetron  (ZOFRAN ) 4 MG tablet Take 1 tablet (4 mg total) by mouth every 8 (eight) hours as needed for nausea or vomiting. 02/27/24   Brown, Blaine K, PA-C  oxyCODONE  (ROXICODONE ) 5 MG immediate release tablet Take 1 tablet (5 mg total) by mouth every 4 (four) hours as needed for severe pain (pain score 7-10). 02/27/24   Brown, Blaine K, PA-C  Polyethyl Glyc-Propyl Glyc PF (SYSTANE HYDRATION PF) 0.4-0.3 % SOLN Apply to eye.    [provider]  Prenatal  Vit-Fe Fumarate-FA (PRENATAL PO) Take 1 capsule by mouth daily. [employer] Prenatal Vitamin    [provider]  sennosides-docusate sodium  (SENOKOT-S) 8.6-50 MG tablet Take 2 tablets by mouth daily. 02/27/24   Brown, Blaine K, PA-C    Allergies: Sulfamethoxazole -trimethoprim , Benadryl [diphenhydramine], Melatonin, and Penicillins    Review of Systems  Genitourinary:  Positive for hematuria.    Updated Vital Signs BP (!) 122/58   Pulse 73   Temp 98.5 F (36.9 C)   Resp 16   SpO2 100%   Physical Exam Vitals and nursing note reviewed.  Constitutional:      General: She is not in acute distress.    Appearance: She is not  toxic-appearing.  HENT:     Head: Normocephalic and atraumatic.   Eyes:     General: No scleral icterus.    Conjunctiva/sclera: Conjunctivae normal.    Cardiovascular:     Rate and Rhythm: Normal rate and regular rhythm.     Pulses: Normal pulses.     Heart sounds: Normal heart sounds.  Pulmonary:     Effort: Pulmonary effort is normal. No respiratory distress.     Breath sounds: Normal breath sounds.  Abdominal:     General: Abdomen is flat. Bowel sounds are normal.     Palpations: Abdomen is soft.     Tenderness: There is abdominal tenderness.   Skin:    General: Skin is warm and dry.     Findings: No lesion.   Neurological:     General: No focal deficit present.     Mental Status: She is alert and oriented to person, place, and time. Mental status is at baseline.     (all labs ordered are listed, but only abnormal results are displayed) Labs Reviewed  URINALYSIS, ROUTINE W REFLEX MICROSCOPIC - Abnormal; Notable for the following components:      Result Value   Hgb urine dipstick LARGE (*)    Protein, ur TRACE (*)    Leukocytes,Ua MODERATE (*)    Crystals PRESENT (*)    All other components within normal limits  CBC - Abnormal; Notable for the following components:   RBC 3.58 (*)    Hemoglobin 10.3 (*)    HCT 33.1 (*)    All other components within normal limits  URINE CULTURE  BASIC METABOLIC PANEL WITH GFR    EKG: None  Radiology: No results found.   Procedures   Medications Ordered in the ED  cephALEXin  (KEFLEX ) capsule 500 mg (has no administration in time range)    Clinical Course as of 03/14/24 1319  Thu Mar 14, 2024  1218 Has tolerated both Ancef  and Keflex  in the past.  [JB]    Clinical Course User Index [JB] Alanta Scobey, Warren SAILOR, PA-C                                 Medical Decision Making Amount and/or Complexity of Data Reviewed Labs: ordered.  Risk Prescription drug management.   Ashley Moore Berber 73 y.o. presented today for  suprapubic pain. Working Ddx: includes, but not limited to, gastroenteritis, colitis, appendicitis, pancreatitis, nephrolithiasis, UTI, pyelonephritis, ectopic pregnancy, PID, ovarian, dehydration, electrolyte abnormalities   R/o DDx: These are considered less likely than current impression due to history of present illness, physical exam, labs/imaging findings.   Problem List / ED Course / Critical interventions / Medication management  Patient presents to emergency room with  complaint of hematuria.  Does have some suprapubic pain and urgency associated with this.  Noticed that over the past 1 to 2 days.  On arrival she is hemodynamically stable and well-appearing.  She does not have fever.  She has no CVA tenderness and does not describe abdominal pain or flank pain.  She is on Eliquis  thus we will check hemoglobin with CBC.  Will check kidney function with BMP.  UA does show large amount of hemoglobin, moderate leukocytes and large amount of WBC.  Will send urine for culture.  Will give first dose of antibiotic here.  She has good primary care follow-up as well as follow-up with oncology.  Feel she is appropriate for discharge with outpatient follow-up. I ordered medication including Keflex  Reevaluation of the patient after these medicines showed that the patient stayed the same Patients vitals assessed. Upon arrival patient is hemodynamically stable.  I have reviewed the patients home medicines and have made adjustments as needed   Plan: F/u w/ PCP in 2-3d to ensure resolution of sx.  Patient was given return precautions. Patient stable for discharge at this time.  Patient educated on current sx/dx and verbalized understanding of plan. Return to ER w/ new or worsening sx.       Final diagnoses:  Hematuria, unspecified type  Acute cystitis with hematuria    ED Discharge Orders          Ordered    cephALEXin  (KEFLEX ) 500 MG capsule  4 times daily        03/14/24 1325                Kynzlie Hilleary, Warren SAILOR, PA-C 03/14/24 1456    Mannie Pac T, DO 03/16/24 [phone]

## 2024-03-15 ENCOUNTER — Telehealth: Payer: Self-pay

## 2024-03-15 LAB — URINE CULTURE

## 2024-03-15 NOTE — Transitions of Care (Post Inpatient/ED Visit) (Signed)
 03/15/2024  Name: Ashley Moore MRN: [id number] DOB: 1951/05/29  Today's TOC FU Call Status: Today's TOC FU Call Status:: Successful TOC FU Call Completed TOC FU Call Complete Date: 03/15/24 Patient's Name and Date of Birth confirmed.  Transition Care Management Follow-up Telephone Call Date of Discharge: 03/14/24 Type of Discharge: Emergency Department Reason for ED Visit: Other: How have you been since you were released from the hospital?: Better Any questions or concerns?: No  Items Reviewed: Did you receive and understand the discharge instructions provided?: Yes Medications obtained,verified, and reconciled?: Yes (Medications Reviewed) Any new allergies since your discharge?: No Dietary orders reviewed?: NA Do you have support at home?: Yes  Medications Reviewed Today: Medications Reviewed Today     Reviewed by Eveline Lauraine BRAVO, CMA (Certified Medical Assistant) on 03/15/24 at 1002  Med List Status: <None>   Medication Order Taking? Sig Documenting Provider Last Dose Status Informant  acetaminophen  (TYLENOL ) 650 MG CR tablet [id number] Yes Take 2 tablets (1,300 mg total) by mouth every 8 (eight) hours as needed for pain. Brown, Blaine K, PA-C  Active   apixaban  (ELIQUIS ) 2.5 MG TABS tablet [id number] Yes Take 1 tablet (2.5 mg total) by mouth 2 (two) times daily. Brown, Blaine K, PA-C  Active   azelastine  (ASTELIN ) 0.1 % nasal spray [id number] Yes Place 2 sprays into both nostrils 2 (two) times daily. Use in each nostril as directed Panosh, Apolinar POUR, MD  Active Self  castor oil liquid [id number] Yes Take 1 mL by mouth daily. Applied to eyelids [provider]  Active Self  cephALEXin  (KEFLEX ) 500 MG capsule [id number] Yes Take 1 capsule (500 mg total) by mouth 4 (four) times daily. Barrett, Jamie N, PA-C  Active   Cholecalciferol (VITAMIN D3 PO) [id number] Yes Take 1 tablet by mouth daily. [provider]  Active Self  Collagen-Vitamin C-Biotin (COLLAGEN PO)  [id number] Yes Take 1 Scoop by mouth in the morning. Gundry MD Phyto Collagen Complex [provider]  Active Self  conjugated estrogens  (PREMARIN ) vaginal cream [id number] Yes Place 1 Applicatorful vaginally at bedtime. [provider]  Active Self  CRANBERRY PO [id number] Yes Take 1 capsule by mouth daily. Organic Cranberry Concentrate by [employer] [provider]  Active Self  levothyroxine  (SYNTHROID ) 75 MCG tablet [id number] Yes TAKE 1 TABLET EVERY DAY Panosh, Wanda K, MD  Active Self  LORazepam  (ATIVAN ) 0.5 MG tablet [id number] Yes Take 1 tablet (0.5 mg total) by mouth daily as needed for anxiety (as needed). Debby Olam POUR, NP  Active Self  methocarbamol  (ROBAXIN ) 500 MG tablet [id number] Yes Take 1 tablet (500 mg total) by mouth every 8 (eight) hours as needed for muscle spasms. Brown, Blaine K, PA-C  Active   Misc Natural Products (TURMERIC CURCUMIN) CAPS [id number] Yes Take 1 capsule by mouth daily. [provider]  Active Self  Multiple Minerals-Vitamins (CAL MAG ZINC +D3 PO) [id number] Yes Take 1 tablet by mouth daily. [provider]  Active Self  MYRBETRIQ  50 MG TB24 tablet [id number] Yes Take 50 mg by mouth in the morning. [provider]  Active Self  olive oil external oil [id number] Yes 1 oz daily. By mouth for constipation. [provider]  Active Self  Olopatadine  HCl (PATADAY ) 0.2 % SOLN [id number] Yes Place 1 drop into both eyes in the morning. [provider]  Active Self  ondansetron  (ZOFRAN ) 4 MG tablet [id number] Yes Take 1 tablet (4 mg total) by mouth every 8 (eight) hours as needed for  nausea or vomiting. Brown, Blaine K, PA-C  Active   oxyCODONE  (ROXICODONE ) 5 MG immediate release tablet [id number] Yes Take 1 tablet (5 mg total) by mouth every 4 (four) hours as needed for severe pain (pain score 7-10). Brown, Blaine K, PA-C  Active   Polyethyl Glyc-Propyl Glyc PF (SYSTANE HYDRATION PF) 0.4-0.3 % SOLN [id number] Yes  Apply to eye. [provider]  Active Self  Prenatal Vit-Fe Fumarate-FA (PRENATAL PO) [id number] Yes Take 1 capsule by mouth daily. [employer] Prenatal Vitamin [provider]  Active Self  sennosides-docusate sodium  (SENOKOT-S) 8.6-50 MG tablet [id number] Yes Take 2 tablets by mouth daily. Brown, Blaine K, PA-C  Active   traMADol  (ULTRAM ) 50 MG tablet [id number] Yes Take 50 mg by mouth 3 (three) times daily as needed. [provider]  Active             Home Care and Equipment/Supplies: Were Home Health Services Ordered?: No Any new equipment or medical supplies ordered?: No  Functional Questionnaire: Do you need assistance with bathing/showering or dressing?: No Do you need assistance with meal preparation?: No Do you need assistance with eating?: No Do you have difficulty maintaining continence: No Do you need assistance with getting out of bed/getting out of a chair/moving?: No Do you have difficulty managing or taking your medications?: No  Follow up appointments reviewed: PCP Follow-up appointment confirmed?: No MD Provider Line Number:[phone] Given: Yes Specialist Hospital Follow-up appointment confirmed?: Yes Do you need transportation to your follow-up appointment?: No Do you understand care options if your condition(s) worsen?: Yes-patient verbalized understanding    SIGNATURE: Lauraine Solomons, CMA [place]

## 2024-03-27 DIAGNOSIS — Z96651 Presence of right artificial knee joint: Secondary | ICD-10-CM | POA: Diagnosis not present

## 2024-03-28 DIAGNOSIS — Z96651 Presence of right artificial knee joint: Secondary | ICD-10-CM | POA: Diagnosis not present

## 2024-04-01 DIAGNOSIS — Z96651 Presence of right artificial knee joint: Secondary | ICD-10-CM | POA: Diagnosis not present

## 2024-04-02 ENCOUNTER — Other Ambulatory Visit: Admitting: Oncology

## 2024-04-04 ENCOUNTER — Ambulatory Visit: Admitting: Family Medicine

## 2024-04-04 DIAGNOSIS — Z0000 Encounter for general adult medical examination without abnormal findings: Secondary | ICD-10-CM | POA: Diagnosis not present

## 2024-04-04 NOTE — Patient Instructions (Signed)
 I really enjoyed getting to talk with you today! I am available on Tuesdays and Thursdays for virtual visits if you have any questions or concerns, or if I can be of any further assistance.   CHECKLIST FROM ANNUAL WELLNESS VISIT:  -Follow up (please call to schedule if not scheduled after visit):   -yearly for annual wellness visit with primary care office  Here is a list of your preventive care/health maintenance measures and the plan for each if any are due:  PLAN For any measures below that may be due:     Health Maintenance  Topic Date Due   Zoster Vaccines- Shingrix (1 of 2) 08/22/1970   DTaP/Tdap/Td (2 - Td or Tdap) 02/14/2023   COVID-19 Vaccine (4 - 2024-25 season) 05/21/2023   Colonoscopy  11/21/2024 (Originally 11/05/2020)   INFLUENZA VACCINE  04/19/2024   MAMMOGRAM  10/02/2024   Medicare Annual Wellness (AWV)  04/04/2025   Pneumococcal Vaccine: 50+ Years  Completed   DEXA SCAN  Completed   Hepatitis C Screening  Completed   Hepatitis B Vaccines  Aged Out   HPV VACCINES  Aged Out   Meningococcal B Vaccine  Aged Out    -See a dentist at least yearly  -Get your eyes checked and then per your eye specialist's recommendations  -Other issues addressed today:   -I have included below further information regarding a healthy whole foods based diet, physical activity guidelines for adults, stress management and opportunities for social connections. I hope you find this information useful.   -----------------------------------------------------------------------------------------------------------------------------------------------------------------------------------------------------------------------------------------------------------    NUTRITION: -eat real food: lots of colorful vegetables (half the plate) and fruits -5-7 servings of vegetables and fruits per day (fresh or steamed is best), exp. 2 servings of vegetables with lunch and dinner and 2 servings of fruit  per day. Berries and greens such as kale and collards are great choices.  -consume on a regular basis:  fresh fruits, fresh veggies, fish, nuts, seeds, healthy oils (such as olive oil, avocado oil), whole grains (make sure for bread/pasta/crackers/etc., that the first ingredient on label contains the word whole), legumes. -can eat small amounts of dairy and lean meat (no larger than the palm of your hand), but avoid processed meats such as ham, bacon, lunch meat, etc. -drink water  -try to avoid fast food and pre-packaged foods, processed meat, ultra processed foods/beverages (donuts, candy, etc.) -most experts advise limiting sodium to < 2300mg  per day, should limit further is any chronic conditions such as high blood pressure, heart disease, diabetes, etc. The American Heart Association advised that < 1500mg  is is ideal -try to avoid foods/beverages that contain any ingredients with names you do not recognize  -try to avoid foods/beverages  with added sugar or sweeteners/sweets  -try to avoid sweet drinks (including diet drinks): soda, juice, Gatorade, sweet tea, power drinks, diet drinks -try to avoid white rice, white bread, pasta (unless whole grain)  EXERCISE GUIDELINES FOR ADULTS: -if you wish to increase your physical activity, do so gradually and with the approval of your doctor -STOP and seek medical care immediately if you have any chest pain, chest discomfort or trouble breathing when starting or increasing exercise  -move and stretch your body, legs, feet and arms when sitting for long periods -Physical activity guidelines for optimal health in adults: -get at least 150 minutes per week of moderate exercise (can talk, but not sing); this is about 20-30 minutes of sustained activity 5-7 days per week or two 10-15 minute episodes of sustained  activity 5-7 days per week -do some muscle building/resistance training/strength training at least 2 days per week  -balance exercises 3+ days per  week:   Stand somewhere where you have something sturdy to hold onto if you lose balance    1) lift up on toes, then back down, start with 5x per day and work up to 20x   2) stand and lift one leg straight out to the side so that foot is a few inches of the floor, start with 5x each side and work up to 20x each side   3) stand on one foot, start with 5 seconds each side and work up to 20 seconds on each side  If you need ideas or help with getting more active:  -Silver  sneakers https://tools.silversneakers.com  -Walk with a Doc: http://www.duncan-williams.com/  -try to include resistance (weight lifting/strength building) and balance exercises twice per week: or the following link for ideas: http://castillo-powell.com/  BuyDucts.dk  STRESS MANAGEMENT: -can try meditating, or just sitting quietly with deep breathing while intentionally relaxing all parts of your body for 5 minutes daily -if you need further help with stress, anxiety or depression please follow up with your primary doctor or contact the wonderful folks at [facility]: [phone]  SOCIAL CONNECTIONS: -options in [place] if you wish to engage in more social and exercise related activities:  -Silver  sneakers https://tools.silversneakers.com  -Walk with a Doc: http://www.duncan-williams.com/  -Check out the [facility] Active Adults 50+ section on the [place] [employer] (hiking clubs, book clubs, cards and games, chess, exercise classes, aquatic classes and much more) - see the website for details: https://www.[place].gov/departments/parks-recreation/active-adults50  -YouTube has lots of exercise videos for different ages and abilities as well  -[facility] (a variety of indoor and outdoor inperson activities for adults). [phone]. [address].  -Virtual Online Classes (a variety of  topics): see seniorplanet.org or call 2493061274  -consider volunteering at a school, hospice center, church, senior center or elsewhere

## 2024-04-04 NOTE — Progress Notes (Signed)
 PATIENT CHECK-IN and HEALTH RISK ASSESSMENT QUESTIONNAIRE:  -completed by phone/video for upcoming Medicare Preventive Visit  -PLEASE SELECT NOT IN PERSON for the method of visit.   Pre-Visit Check-in: 1)Vitals (height, wt, BP, etc) - record in vitals section for visit on day of visit Request home vitals (wt, BP, etc.) and enter into vitals, THEN update Vital Signs SmartPhrase below at the top of the HPI. See below.  2)Review and Update Medications, Allergies PMH, Surgeries, Social history in Epic 3)Hospitalizations in the last year with date/reason? For knee replacement  4)Review and Update Care Team (patient's specialists) in Epic 5) Complete PHQ9 in Epic  6) Complete Fall Screening in Epic 7)Review all Health Maintenance Due and order if not done.  Medicare Wellness Patient Questionnaire:  Answer theses question about your habits: How often do you have a drink containing alcohol ? Once per month How many drinks containing alcohol  do you have on a typical day when you are drinking?glass of whine How often do you have six or more drinks on one occasion?never Have you ever smoked?y Quit date if applicable? 1996  How many packs a day do/did you smoke? 1ppd Do you use smokeless tobacco?n Do you use an illicit drugs?yes, marijauna On average, how many days per week do you engage in moderate to strenuous exercise (like a brisk walk)? Doing physical therapy - doing exercise every day On average, how many minutes do you engage in exercise at this level? Always on her feet, doing PT daily (does the exercises on her own and sees PT twice a week) Typical diet: angiogenesis diet, Mediterranean diet  Beverages: water , green tea  Answer theses question about your everyday activities: Can you perform most household chores?y Are you deaf or have significant trouble hearing?n Do you feel that you have a problem with memory?n Do you feel safe at home?y Last dentist visit? Goes on a regular  basis 8. Do you have any difficulty performing your everyday activities?n Are you having any difficulty walking, taking medications on your own, and or difficulty managing daily home needs?n Do you have difficulty walking or climbing stairs? Some trouble right now as recovering from knee surgery Do you have difficulty dressing or bathing?n Do you have difficulty doing errands alone such as visiting a doctor's office or shopping?n Do you currently have any difficulty preparing food and eating?n Do you currently have any difficulty using the toilet?n Do you have any difficulty managing your finances?n Do you have any difficulties with housekeeping of managing your housekeeping?n   Do you have Advanced Directives in place (Living Will, Healthcare Power or Attorney)? y   Last eye Exam and location? Goes on regular basis   Do you currently use prescribed or non-prescribed narcotic or opioid pain medications?n  Do you have a history or close family history of breast, ovarian, tubal or peritoneal cancer or a family member with BRCA (breast cancer susceptibility 1 and 2) gene mutations? Hx of cancer     ----------------------------------------------------------------------------------------------------------------------------------------------------------------------------------------------------------------------  Because this visit was a virtual/telehealth visit, some criteria may be missing or patient reported. Any vitals not documented were not able to be obtained and vitals that have been documented are patient reported.    MEDICARE ANNUAL PREVENTIVE VISIT WITH PROVIDER: (Welcome to Medicare, initial annual wellness or annual wellness exam)  Virtual Visit via Video Note  I connected with Ashley Moore on 04/04/24 by a video enabled telemedicine application and verified that I am speaking with the correct person using two identifiers.  Location patient: home Location  provider:work or home office Persons participating in the virtual visit: patient, provider  Concerns and/or follow up today: had knee replacement recently - recovering.   See HM section in Epic for other details of completed HM.    ROS: negative for report of fevers, unintentional weight loss, vision changes, vision loss, hearing loss or change, chest pain, sob, hemoptysis, melena, hematochezia, falls, bleeding or bruising, thoughts of suicide or self harm, memory loss  Patient-completed extensive health risk assessment - reviewed and discussed with the patient: See Health Risk Assessment completed with patient prior to the visit either above or in recent phone note. This was reviewed in detailed with the patient today and appropriate recommendations, orders and referrals were placed as needed per Summary below and patient instructions.   Review of Medical History: -PMH, PSH, Family History and current specialty and care providers reviewed and updated and listed below   Patient Care Team: Panosh, Apolinar POUR, MD as PCP - General Nahser, Aleene PARAS, MD as PCP - Cardiology (Cardiology) Debrah Lamar BIRCH, MD (Inactive) as Attending Physician (Gastroenterology) [name] Arley NOVAK, MD as Consulting Physician (Oncology) Dewey Rush, MD as Consulting Physician (Radiation Oncology) Skeet Juliene SAUNDERS, DO as Consulting Physician (Neurology) Leellen Devere BIRCH, NP as Nurse Practitioner (Nurse Practitioner) Edwena Berg, MD as Referring Physician (Obstetrics and Gynecology) Liane Sharyne MATSU, [facility] (Inactive) as Pharmacist (Pharmacist) Lelon Glendia ONEIDA DEVONNA as Physician Assistant (Cardiology)   Past Medical History:  Diagnosis Date   Arthritis    Bowen's disease    excised 1992   Chronic low back pain    Chronic radiation cystitis    DDD (degenerative disc disease), cervical    Family history of breast cancer    Family history of lung cancer    Family history of non-Hodgkin's lymphoma     Family history of ovarian cancer    Headache    Hx of varicella    Hydronephrosis of right kidney    urologist--- dr alvaro,  malignant,  treated with ureter stent   Hypothyroidism    followed by pcp   Lower urinary tract symptoms (LUTS)    01-12-2021  per pt treated with accupuncture at dr alvaro office   Lymphedema of both lower extremities    PICC (peripherally inserted central catheter) in place 01/11/2021   placed at [facility] in [place], [place] for IV antibiotics   Positive blood culture 01/08/2021   Klebsiella Pneumaniae,  treated with daily IV antibiotic   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,  invasive SCC , completed chemo/ radiation 12-23-2016;  recurrent metastatsis retroperitoneal lymphdenopathy,  completed radiation 04-13-2020 residual right ureteral uropathy obstruction   Retroperitoneal lymphadenopathy    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   Sepsis due to Klebsiella pneumoniae (HCC) 01/07/2021   pt admitted to [facility] in [place],  dx sepsis secondary to acute pyelonephritis with bacteremia , positive blood culture,  discharged 01-11-2021 home daily IV antibiotic   Wears glasses     Past Surgical History:  Procedure Laterality Date   BREAST BIOPSY Right    BUNIONECTOMY Right yrs ago   CYSTOSCOPY W/ URETERAL STENT PLACEMENT Right 06/12/2020   Procedure: CYSTOSCOPY WITH RETROGRADE PYELOGRAM/URETERAL STENT EXCHANGE;  Surgeon: [name], MD;  Location: [facility];  Service: Urology;  Laterality: Right;   CYSTOSCOPY W/ URETERAL STENT PLACEMENT Right 01/13/2021   Procedure: CYSTOSCOPY WITH RETROGRADE PYELOGRAM/URETERAL STENT  REMOVALRIGHT;  Surgeon: [name], MD;  Location: [facility];  Service: Urology;  Laterality: Right;  45 MINS   CYSTOSCOPY WITH RETROGRADE PYELOGRAM, URETEROSCOPY AND STENT PLACEMENT Right 01/28/2020   Procedure: CYSTOSCOPY  WITH RETROGRADE PYELOGRAM, URETEROSCOPY AND STENT PLACEMENT;  Surgeon: [name], MD;  Location: [facility];  Service: Urology;  Laterality: Right;   EYE SURGERY Bilateral    ptosis   IR GENERIC HISTORICAL  11/14/2016   IR US  GUIDE VASC ACCESS RIGHT 11/14/2016 [facility]   IR GENERIC HISTORICAL  11/14/2016   IR FLUORO GUIDE CV LINE RIGHT 11/14/2016 [facility]   IR GENERIC HISTORICAL  12/12/2016   IR US  GUIDE VASC ACCESS RIGHT 12/12/2016 [name], MD [facility]   IR GENERIC HISTORICAL  12/12/2016   IR FLUORO GUIDE CV LINE RIGHT 12/12/2016 [name], MD [facility]   SKIN CANCER EXCISION  1990   bowens disease   TOTAL KNEE ARTHROPLASTY Right 02/27/2024   Procedure: ARTHROPLASTY, KNEE, TOTAL;  Surgeon: [name], MD;  Location: [facility];  Service: Orthopedics;  Laterality: Right;    Social History   Socioeconomic History   Marital status: Widowed    Spouse name: Not on file   Number of children: 0   Years of education: 14   Highest education level: Associate degree: academic program  Occupational History   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, museum  Tobacco Use   Smoking status: Former    Current packs/day: 0.00    Average packs/day: 1 pack/day for 25.0 years (25.0 ttl pk-yrs)    Types: Cigarettes    Start date: 06/19/1970    Quit date: 06/20/1995    Years since quitting: 28.8   Smokeless tobacco: Never  Vaping Use   Vaping status: Never Used  Substance and Sexual Activity   Alcohol  use: Not Currently    Alcohol /week: 0.0 standard drinks of alcohol     Comment: occasional wine   Drug use: Yes    Types: Marijuana    Comment: 01-12-2021  per pt once a week   Sexual activity: Not on file  Other Topics Concern   Not on file  Social History Narrative   H H  of 1      5 pets.   She is a former smoker   Retired Control and instrumentation engineer; Scientist, physiological   etoh   Red wine  1 per night.    Tea  green tea and earl gray    Moved from [place] to [place] area in 04-14-84   1 pregnancy   Husband died spring  2016 cv   Sister died 2015-03-16 Bone cancer    3 remaining sisters            Social Drivers of Corporate investment banker Strain: Low Risk  (06/26/2023)   Overall Financial Resource Strain (CARDIA)    Difficulty of Paying Living Expenses: Not hard at all  Food Insecurity: No Food Insecurity (02/28/2024)   Hunger Vital Sign    Worried About Running Out of Food in the Last Year: Never true    Ran Out of Food in the Last Year: Never true  Transportation Needs: No Transportation Needs (02/28/2024)   PRAPARE - Administrator, Civil Service (Medical): No    Lack of Transportation (Non-Medical): No  Physical Activity: Insufficiently Active (06/26/2023)   Exercise Vital Sign    Days of Exercise  per Week: 6 days    Minutes of Exercise per Session: 20 min  Stress: Patient Declined (06/26/2023)   [employer] of Occupational Health - Occupational Stress Questionnaire    Feeling of Stress : Patient declined  Social Connections: Moderately Integrated (02/28/2024)   Social Connection and Isolation Panel    Frequency of Communication with Friends and Family: Twice a week    Frequency of Social Gatherings with Friends and Family: More than three times a week    Attends Religious Services: More than 4 times per year    Active Member of [employer] or Organizations: Yes    Attends Banker Meetings: More than 4 times per year    Marital Status: Widowed  Intimate Partner Violence: Not At Risk (02/28/2024)   Humiliation, Afraid, Rape, and Kick questionnaire    Fear of Current or Ex-Partner: No    Emotionally Abused: No    Physically Abused: No    Sexually Abused: No    Family History  Problem Relation Age of Onset   Lung cancer Mother        lung   Breast cancer Mother 4   Ovarian cancer Mother 69   Pancreatitis Father    Breast cancer Sister 48   Breast cancer  Maternal Grandmother 16       breast    Breast cancer Maternal Aunt 36       breast   Melanoma Sister    Breast cancer Sister 58   Non-Hodgkin's lymphoma Paternal Grandmother     Current Outpatient Medications on File Prior to Visit  Medication Sig Dispense Refill   acetaminophen  (TYLENOL ) 650 MG CR tablet Take 2 tablets (1,300 mg total) by mouth every 8 (eight) hours as needed for pain. 90 tablet 2   azelastine  (ASTELIN ) 0.1 % nasal spray Place 2 sprays into both nostrils 2 (two) times daily. Use in each nostril as directed 30 mL 1   castor oil liquid Take 1 mL by mouth daily. Applied to eyelids     Cholecalciferol (VITAMIN D3 PO) Take 1 tablet by mouth daily.     Collagen-Vitamin C-Biotin (COLLAGEN PO) Take 1 Scoop by mouth in the morning. Gundry MD Phyto Collagen Complex     conjugated estrogens  (PREMARIN ) vaginal cream Place 1 Applicatorful vaginally at bedtime.     CRANBERRY PO Take 1 capsule by mouth daily. Organic Cranberry Concentrate by Pure Co     levothyroxine  (SYNTHROID ) 75 MCG tablet TAKE 1 TABLET EVERY DAY 90 tablet 2   methocarbamol  (ROBAXIN ) 500 MG tablet Take 1 tablet (500 mg total) by mouth every 8 (eight) hours as needed for muscle spasms. 30 tablet 0   Misc Natural Products (TURMERIC CURCUMIN) CAPS Take 1 capsule by mouth daily.     Multiple Minerals-Vitamins (CAL MAG ZINC +D3 PO) Take 1 tablet by mouth daily.     MYRBETRIQ  50 MG TB24 tablet Take 50 mg by mouth in the morning.     olive oil external oil 1 oz daily. By mouth for constipation.     Olopatadine  HCl (PATADAY ) 0.2 % SOLN Place 1 drop into both eyes in the morning.     Polyethyl Glyc-Propyl Glyc PF (SYSTANE HYDRATION PF) 0.4-0.3 % SOLN Apply to eye.     Prenatal Vit-Fe Fumarate-FA (PRENATAL PO) Take 1 capsule by mouth daily. [employer] Prenatal Vitamin     No current facility-administered medications on file prior to visit.    Allergies  Allergen Reactions  Sulfamethoxazole -Trimethoprim   Other (See Comments)    C/o  abd pain and constipation   Benadryl [Diphenhydramine] Other (See Comments)    Tingle all over    Melatonin     Tingle all over    Penicillins Rash    Did it involve swelling of the face/tongue/throat, SOB, or low BP? N Did it involve sudden or severe rash/hives, skin peeling, or any reaction on the inside of your mouth or nose? Y Did you need to seek medical attention at a hospital or doctor's office? N When did it last happen? Almost 50 years Ago      If all above answers are NO, may proceed with cephalosporin use. Tolerated Ancef  02/27/24        Physical Exam Vitals requested from patient and listed below if patient had equipment and was able to obtain at home for this virtual visit: There were no vitals filed for this visit. Estimated body mass index is 31.18 kg/m as calculated from the following:   Height as of 02/27/24: 5' 1 (1.549 m).   Weight as of 02/27/24: 165 lb (74.8 kg).  EKG (optional): deferred due to virtual visit  GENERAL: alert, oriented, no acute distress detected, full vision exam deferred due to pandemic and/or virtual encounter  HEENT: atraumatic, conjunttiva clear, no obvious abnormalities on inspection of external nose and ears  NECK: normal movements of the head and neck  LUNGS: on inspection no signs of respiratory distress, breathing rate appears normal, no obvious gross SOB, gasping or wheezing  CV: no obvious cyanosis  MS: moves all visible extremities without noticeable abnormality  PSYCH/NEURO: pleasant and cooperative, no obvious depression or anxiety, speech and thought processing grossly intact, Cognitive function grossly intact  Flowsheet Row Video Visit from 11/22/2022 in [facility]  PHQ-9 Total Score 4        04/04/2024    5:39 PM 01/03/2024    9:23 PM 10/05/2023   12:21 PM 12/26/2022    1:36 PM 11/22/2022    4:12 PM  Depression screen PHQ 2/9  Decreased Interest 0 0 0 0 0   Down, Depressed, Hopeless 0 0 0 0   PHQ - 2 Score 0 0 0 0 0  Altered sleeping     0  Tired, decreased energy     1  Change in appetite     3  Feeling bad or failure about yourself      0  Trouble concentrating     0  Moving slowly or fidgety/restless     0  Suicidal thoughts     0  PHQ-9 Score     4  Difficult doing work/chores     Not difficult at all       12/26/2022    1:34 PM 03/02/2023   12:10 PM 06/26/2023    2:08 PM 01/03/2024   12:00 PM 04/04/2024    5:39 PM  Fall Risk  Falls in the past year? 0 1 0 1 0  Was there an injury with Fall?  0  0 0  Fall Risk Category Calculator  1  2 0  Patient at Risk for Falls Due to  Impaired balance/gait     Fall risk Follow up     Falls evaluation completed     SUMMARY AND PLAN:  Encounter for Medicare annual wellness exam   Discussed applicable health maintenance/preventive health measures and advised and referred or ordered per patient preferences: -reports gets cat scans  with GI, getting next month -discussed vaccines due risks/recs, she plans to do and agrees to let us  know when she does  Health Maintenance  Topic Date Due   Zoster Vaccines- Shingrix (1 of 2) 08/22/1970   DTaP/Tdap/Td (2 - Td or Tdap) 02/14/2023   COVID-19 Vaccine (4 - 2024-25 season) 05/21/2023   Colonoscopy  11/21/2024 (Originally 11/05/2020)   INFLUENZA VACCINE  04/19/2024   MAMMOGRAM  10/02/2024   Medicare Annual Wellness (AWV)  04/04/2025   Pneumococcal Vaccine: 50+ Years  Completed   DEXA SCAN  Completed   Hepatitis C Screening  Completed   Hepatitis B Vaccines  Aged Out   HPV VACCINES  Aged Out   Meningococcal B Vaccine  Aged Out      Education and counseling on the following was provided based on the above review of health and a plan/checklist for the patient, along with additional information discussed, was provided for the patient in the patient instructions :   -Reviewed patient's current diet. Congratulated on healthy choices A summary of  a healthy diet was provided in the Patient Instructions.  -reviewed patient's current physical activity level. Congratulated o Runner, broadcasting/film/video. Exercise guidelines and further information provided - see patient instructions.  -Advise yearly dental visits at minimum and regular eye exams   Follow up: see patient instructions     Patient Instructions  I really enjoyed getting to talk with you today! I am available on Tuesdays and Thursdays for virtual visits if you have any questions or concerns, or if I can be of any further assistance.   CHECKLIST FROM ANNUAL WELLNESS VISIT:  -Follow up (please call to schedule if not scheduled after visit):   -yearly for annual wellness visit with primary care office  Here is a list of your preventive care/health maintenance measures and the plan for each if any are due:  PLAN For any measures below that may be due:     Health Maintenance  Topic Date Due   Zoster Vaccines- Shingrix (1 of 2) [birth year]/22/1970   DTaP/Tdap/Td (2 - Td or Tdap) 02/14/2023   COVID-19 Vaccine (4 - 2024-25 season) 05/21/2023   Colonoscopy  11/21/2024 (Originally 11/05/2020)   INFLUENZA VACCINE  04/19/2024   MAMMOGRAM  10/02/2024   Medicare Annual Wellness (AWV)  04/04/2025   Pneumococcal Vaccine: 50+ Years  Completed   DEXA SCAN  Completed   Hepatitis C Screening  Completed   Hepatitis B Vaccines  Aged Out   HPV VACCINES  Aged Out   Meningococcal B Vaccine  Aged Out    -See a dentist at least yearly  -Get your eyes checked and then per your eye specialist's recommendations  -Other issues addressed today:   -I have included below further information regarding a healthy whole foods based diet, physical activity guidelines for adults, stress management and opportunities for social connections. I hope you find this information useful.    -----------------------------------------------------------------------------------------------------------------------------------------------------------------------------------------------------------------------------------------------------------    NUTRITION: -eat real food: lots of colorful vegetables (half the plate) and fruits -5-7 servings of vegetables and fruits per day (fresh or steamed is best), exp. 2 servings of vegetables with lunch and dinner and 2 servings of fruit per day. Berries and greens such as kale and collards are great choices.  -consume on a regular basis:  fresh fruits, fresh veggies, fish, nuts, seeds, healthy oils (such as olive oil, avocado oil), whole grains (make sure for bread/pasta/crackers/etc., that the first ingredient on label contains the word whole), legumes. -can eat small  amounts of dairy and lean meat (no larger than the palm of your hand), but avoid processed meats such as ham, bacon, lunch meat, etc. -drink water  -try to avoid fast food and pre-packaged foods, processed meat, ultra processed foods/beverages (donuts, candy, etc.) -most experts advise limiting sodium to < 2300mg  per day, should limit further is any chronic conditions such as high blood pressure, heart disease, diabetes, etc. The American Heart Association advised that < 1500mg  is is ideal -try to avoid foods/beverages that contain any ingredients with names you do not recognize  -try to avoid foods/beverages  with added sugar or sweeteners/sweets  -try to avoid sweet drinks (including diet drinks): soda, juice, Gatorade, sweet tea, power drinks, diet drinks -try to avoid white rice, white bread, pasta (unless whole grain)  EXERCISE GUIDELINES FOR ADULTS: -if you wish to increase your physical activity, do so gradually and with the approval of your doctor -STOP and seek medical care immediately if you have any chest pain, chest discomfort or trouble breathing when starting or  increasing exercise  -move and stretch your body, legs, feet and arms when sitting for long periods -Physical activity guidelines for optimal health in adults: -get at least 150 minutes per week of moderate exercise (can talk, but not sing); this is about 20-30 minutes of sustained activity 5-7 days per week or two 10-15 minute episodes of sustained activity 5-7 days per week -do some muscle building/resistance training/strength training at least 2 days per week  -balance exercises 3+ days per week:   Stand somewhere where you have something sturdy to hold onto if you lose balance    1) lift up on toes, then back down, start with 5x per day and work up to 20x   2) stand and lift one leg straight out to the side so that foot is a few inches of the floor, start with 5x each side and work up to 20x each side   3) stand on one foot, start with 5 seconds each side and work up to 20 seconds on each side  If you need ideas or help with getting more active:  -Silver  sneakers https://tools.silversneakers.com  -Walk with a Doc: http://www.duncan-williams.com/  -try to include resistance (weight lifting/strength building) and balance exercises twice per week: or the following link for ideas: http://castillo-powell.com/  BuyDucts.dk  STRESS MANAGEMENT: -can try meditating, or just sitting quietly with deep breathing while intentionally relaxing all parts of your body for 5 minutes daily -if you need further help with stress, anxiety or depression please follow up with your primary doctor or contact the wonderful folks at [facility]: [phone]  SOCIAL CONNECTIONS: -options in [place] if you wish to engage in more social and exercise related activities:  -Silver  sneakers https://tools.silversneakers.com  -Walk with a Doc: http://www.duncan-williams.com/  -Check out the [facility] Active Adults 50+  section on the [place] of [employer] (hiking clubs, book clubs, cards and games, chess, exercise classes, aquatic classes and much more) - see the website for details: https://www.[place].gov/departments/parks-recreation/active-adults50  -YouTube has lots of exercise videos for different ages and abilities as well  -[facility] (a variety of indoor and outdoor inperson activities for adults). (848)428-9767. [address].  -Virtual Online Classes (a variety of topics): see seniorplanet.org or call [phone]  -consider volunteering at a school, hospice center, church, senior center or elsewhere            Chiquita JONELLE Cramp, DO

## 2024-04-05 DIAGNOSIS — Z96651 Presence of right artificial knee joint: Secondary | ICD-10-CM | POA: Diagnosis not present

## 2024-04-09 DIAGNOSIS — Z96651 Presence of right artificial knee joint: Secondary | ICD-10-CM | POA: Diagnosis not present

## 2024-04-10 DIAGNOSIS — M19011 Primary osteoarthritis, right shoulder: Secondary | ICD-10-CM | POA: Diagnosis not present

## 2024-04-11 DIAGNOSIS — Z96651 Presence of right artificial knee joint: Secondary | ICD-10-CM | POA: Diagnosis not present

## 2024-04-15 ENCOUNTER — Other Ambulatory Visit (HOSPITAL_BASED_OUTPATIENT_CLINIC_OR_DEPARTMENT_OTHER): Payer: Self-pay

## 2024-04-15 MED ORDER — BOOSTRIX 5-2.5-18.5 LF-MCG/0.5 IM SUSY
PREFILLED_SYRINGE | INTRAMUSCULAR | 0 refills | Status: DC
  Filled 2024-04-15: qty 0.5, 1d supply, fill #0

## 2024-04-16 DIAGNOSIS — Z96651 Presence of right artificial knee joint: Secondary | ICD-10-CM | POA: Diagnosis not present

## 2024-04-18 DIAGNOSIS — Z96651 Presence of right artificial knee joint: Secondary | ICD-10-CM | POA: Diagnosis not present

## 2024-04-19 ENCOUNTER — Other Ambulatory Visit (HOSPITAL_BASED_OUTPATIENT_CLINIC_OR_DEPARTMENT_OTHER): Payer: Self-pay

## 2024-04-19 MED ORDER — ZOSTER VAC RECOMB ADJUVANTED 50 MCG/0.5ML IM SUSR
0.5000 mL | Freq: Once | INTRAMUSCULAR | 1 refills | Status: AC
  Filled 2024-04-19: qty 0.5, 1d supply, fill #0

## 2024-04-23 DIAGNOSIS — Z96651 Presence of right artificial knee joint: Secondary | ICD-10-CM | POA: Diagnosis not present

## 2024-04-25 DIAGNOSIS — Z96651 Presence of right artificial knee joint: Secondary | ICD-10-CM | POA: Diagnosis not present

## 2024-04-30 DIAGNOSIS — Z96651 Presence of right artificial knee joint: Secondary | ICD-10-CM | POA: Diagnosis not present

## 2024-05-02 DIAGNOSIS — Z96651 Presence of right artificial knee joint: Secondary | ICD-10-CM | POA: Diagnosis not present

## 2024-05-02 DIAGNOSIS — M6740 Ganglion, unspecified site: Secondary | ICD-10-CM | POA: Diagnosis not present

## 2024-05-02 DIAGNOSIS — M069 Rheumatoid arthritis, unspecified: Secondary | ICD-10-CM | POA: Diagnosis not present

## 2024-05-02 DIAGNOSIS — M778 Other enthesopathies, not elsewhere classified: Secondary | ICD-10-CM | POA: Diagnosis not present

## 2024-05-06 ENCOUNTER — Ambulatory Visit (HOSPITAL_BASED_OUTPATIENT_CLINIC_OR_DEPARTMENT_OTHER)
Admission: RE | Admit: 2024-05-06 | Discharge: 2024-05-06 | Disposition: A | Discharge status: Home / Self Care | Source: Ambulatory Visit | Attending: Nurse Practitioner | Admitting: Nurse Practitioner

## 2024-05-06 ENCOUNTER — Inpatient Hospital Stay: Attending: Nurse Practitioner

## 2024-05-06 DIAGNOSIS — Z87440 Personal history of urinary (tract) infections: Secondary | ICD-10-CM | POA: Diagnosis not present

## 2024-05-06 DIAGNOSIS — R310 Gross hematuria: Secondary | ICD-10-CM | POA: Diagnosis not present

## 2024-05-06 DIAGNOSIS — E039 Hypothyroidism, unspecified: Secondary | ICD-10-CM | POA: Insufficient documentation

## 2024-05-06 DIAGNOSIS — N2889 Other specified disorders of kidney and ureter: Secondary | ICD-10-CM | POA: Diagnosis not present

## 2024-05-06 DIAGNOSIS — C772 Secondary and unspecified malignant neoplasm of intra-abdominal lymph nodes: Secondary | ICD-10-CM

## 2024-05-06 DIAGNOSIS — I700 Atherosclerosis of aorta: Secondary | ICD-10-CM | POA: Diagnosis not present

## 2024-05-06 DIAGNOSIS — C210 Malignant neoplasm of anus, unspecified: Secondary | ICD-10-CM | POA: Insufficient documentation

## 2024-05-06 LAB — CBC WITH DIFFERENTIAL (CANCER CENTER ONLY)
Abs Immature Granulocytes: 0.01 K/uL (ref 0.00–0.07)
Basophils Absolute: 0 K/uL (ref 0.0–0.1)
Basophils Relative: 1 %
Eosinophils Absolute: 0.2 K/uL (ref 0.0–0.5)
Eosinophils Relative: 6 %
HCT: 39.3 % (ref 36.0–46.0)
Hemoglobin: 12.4 g/dL (ref 12.0–15.0)
Immature Granulocytes: 0 %
Lymphocytes Relative: 16 %
Lymphs Abs: 0.6 K/uL — ABNORMAL LOW (ref 0.7–4.0)
MCH: 28.4 pg (ref 26.0–34.0)
MCHC: 31.6 g/dL (ref 30.0–36.0)
MCV: 90.1 fL (ref 80.0–100.0)
Monocytes Absolute: 0.3 K/uL (ref 0.1–1.0)
Monocytes Relative: 9 %
Neutro Abs: 2.6 K/uL (ref 1.7–7.7)
Neutrophils Relative %: 68 %
Platelet Count: 199 K/uL (ref 150–400)
RBC: 4.36 MIL/uL (ref 3.87–5.11)
RDW: 14.1 % (ref 11.5–15.5)
WBC Count: 3.8 K/uL — ABNORMAL LOW (ref 4.0–10.5)
nRBC: 0 % (ref 0.0–0.2)

## 2024-05-06 LAB — CMP (CANCER CENTER ONLY)
ALT: 12 U/L (ref 0–44)
AST: 29 U/L (ref 15–41)
Albumin: 4.1 g/dL (ref 3.5–5.0)
Alkaline Phosphatase: 85 U/L (ref 38–126)
Anion gap: 10 (ref 5–15)
BUN: 20 mg/dL (ref 8–23)
CO2: 24 mmol/L (ref 22–32)
Calcium: 9.7 mg/dL (ref 8.9–10.3)
Chloride: 102 mmol/L (ref 98–111)
Creatinine: 0.9 mg/dL (ref 0.44–1.00)
GFR, Estimated: >60 mL/min (ref >60)
Glucose, Bld: 85 mg/dL (ref 70–99)
Potassium: 4.9 mmol/L (ref 3.5–5.1)
Sodium: 136 mmol/L (ref 135–145)
Total Bilirubin: 0.3 mg/dL (ref 0.0–1.2)
Total Protein: 7.2 g/dL (ref 6.5–8.1)

## 2024-05-06 LAB — TSH: TSH: 3.49 u[IU]/mL (ref 0.350–4.500)

## 2024-05-06 MED ORDER — IOHEXOL 300 MG/ML  SOLN
100.0000 mL | Freq: Once | INTRAMUSCULAR | Status: AC | PRN
  Administered 2024-05-06: 100 mL via INTRAVENOUS

## 2024-05-07 DIAGNOSIS — Z96651 Presence of right artificial knee joint: Secondary | ICD-10-CM | POA: Diagnosis not present

## 2024-05-07 LAB — T4: T4, Total: 8.8 ug/dL (ref 4.5–12.0)

## 2024-05-08 DIAGNOSIS — Z96651 Presence of right artificial knee joint: Secondary | ICD-10-CM | POA: Diagnosis not present

## 2024-05-09 DIAGNOSIS — Z96651 Presence of right artificial knee joint: Secondary | ICD-10-CM | POA: Diagnosis not present

## 2024-05-13 DIAGNOSIS — Z96651 Presence of right artificial knee joint: Secondary | ICD-10-CM | POA: Diagnosis not present

## 2024-05-15 ENCOUNTER — Inpatient Hospital Stay: Admitting: Oncology

## 2024-05-15 VITALS — BP 126/70 | HR 78 | Temp 97.8°F | Resp 18 | Ht 61.0 in | Wt 165.8 lb

## 2024-05-15 DIAGNOSIS — C210 Malignant neoplasm of anus, unspecified: Secondary | ICD-10-CM

## 2024-05-15 DIAGNOSIS — E039 Hypothyroidism, unspecified: Secondary | ICD-10-CM | POA: Diagnosis not present

## 2024-05-15 DIAGNOSIS — Z87440 Personal history of urinary (tract) infections: Secondary | ICD-10-CM | POA: Diagnosis not present

## 2024-05-15 DIAGNOSIS — R310 Gross hematuria: Secondary | ICD-10-CM | POA: Diagnosis not present

## 2024-05-15 NOTE — Progress Notes (Signed)
 [facility] OFFICE PROGRESS NOTE   Diagnosis: Anal cancer  INTERVAL HISTORY:  Ashley Moore returns as scheduled.  She reports feeling well.  She underwent right knee replacement surgery on 02/27/2024.  She reports improvement in right knee pain following surgery.  She is ambulating.  The right leg has been more swollen following surgery. She had a urinary tract infection following surgery.  She underwent restaging CTs 05/06/2024.  She reports hematuria with clots for 2 days following the CTs.  She does not have symptoms of a urinary tract infection at present.  Objective:  Vital signs in last 24 hours:  Blood pressure 126/70, pulse 78, temperature 97.8 F (36.6 C), temperature source Temporal, resp. rate 18, height 5' 1 (1.549 m), weight 165 lb 12.8 oz (75.2 kg), SpO2 100%.   Lymphatics: No cervical, supraclavicular, axillary, or inguinal nodes Resp: Lungs with bilateral end inspiratory rales, no respiratory distress Cardio: Regular rate and rhythm GI: No hepatosplenomegaly, mild swelling in the suprapubic region Vascular: Bilateral leg edema, slightly increased in the right compared to the left lower leg Rectal: There is a stricture at the anal verge precluding complete rectal exam, no anal mass on exam approximately 2 cm into the anal canal, extensive radiation pigment change with skin thickening at the perineum extending to the vagina   Lab Results:  Lab Results  Component Value Date   WBC 3.8 (L) 05/06/2024   HGB 12.4 05/06/2024   HCT 39.3 05/06/2024   MCV 90.1 05/06/2024   PLT 199 05/06/2024   NEUTROABS 2.6 05/06/2024    CMP  Lab Results  Component Value Date   NA 136 05/06/2024   K 4.9 05/06/2024   CL 102 05/06/2024   CO2 24 05/06/2024   GLUCOSE 85 05/06/2024   BUN 20 05/06/2024   CREATININE 0.90 05/06/2024   CALCIUM 9.7 05/06/2024   PROT 7.2 05/06/2024   ALBUMIN 4.1 05/06/2024   AST 29 05/06/2024   ALT 12 05/06/2024   ALKPHOS 85 05/06/2024    BILITOT 0.3 05/06/2024   GFRNONAA >60 05/06/2024   GFRAA >60 06/17/2020    Lab Results  Component Value Date   CEA1 1.15 03/04/2021   CEA 1.16 03/04/2021    Lab Results  Component Value Date   INR 1.2 05/16/2021   LABPROT 15.2 05/16/2021    Imaging:  No results found.  Medications: I have reviewed the patient's current medications.   Assessment/Plan: Anal cancer CT abdomen/pelvis 10/21/2016-thickening of the anus extending to the junction between the anus and rectum with a large stool ball in the rectum and mild fat stranding posterior to the rectum. Enlarged lymph nodes in the right and left inguinal regions. A few mildly prominent nodes seen posterior to the rectum. 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 PET scan 11/10/2016-markedly hypermetabolic anal mass with hypermetabolic metastases to the groin region bilaterally, left pelvic sidewall and presacral space. Initiation of radiation and cycle 1 5-FU/mitomycin  C 11/14/2016 Cycle 2 5-FU/mitomycin  C 12/12/2016 (5-FU dose reduced due to mucositis, diarrhea, skin breakdown) Radiation completed 12/23/2016 CT abdomen/pelvis 04/14/2017-resolution of anal mass and bilateral inguinal lymphadenopathy. No residual tumor seen. CT abdomen/pelvis 01/28/2020-right hydroureteronephrosis, transition at the level of the mid right ureter, retroperitoneal lymphadenopathy CT renal stone study 01/28/2020-severe right hydronephrosis and proximal right hydroureter, 8 mm area of increased attenuation in the proximal right ureter-stone versus soft tissue mass PET 02/13/2020-hypermetabolic right retroperitoneal nodal metastases in the aortocaval and posterior pericaval chains, nonspecific mild anal wall hypermetabolism, right nephroureteral  stent 02/21/2020 anorectal exam per Dr. Garr radiation changes of the skin around the anal margin.  Posterior midline smooth scarring.  Mildly stenotic.  Stenosis seems better.  No  palpable concerning lesions.  Entire anal canal and distal rectum feels smooth and healthy.  Partial anoscopy performed with no lesions in the anal canal. Xeloda /radiation beginning 03/02/2020, completed 04/13/2020 CT abdomen/pelvis 06/17/2020-resolution of right retroperitoneal lymphadenopathy, no remaining pathologically enlarged lymph nodes, residual mild right hydronephrosis, no evidence of progressive disease, stable anal wall thickening Digital rectal exam by Dr. Lang 08/26/2020-posterior midline smooth scarring.  Mildly stenotic.  Stenosis seems better.  No palpable concerning lesions.  Anal canal feels smooth without palpable concern.  Unable to tolerate anoscopy.  Next visit in 6 months for surveillance. CT abdomen/pelvis 10/28/2020-no evidence of local recurrence or metastatic disease.  Stable mild rectal wall thickening.  New diffuse bladder wall thickening possibly related to prior radiation CT abdomen/pelvis 01/16/2021- recurrent right-sided hydronephrosis and proximal right hydroureter status post removal of right ureter stent, new soft tissue nodule along the course of the right ureter between the right psoas and IVC suspicious for recurrent lymphadenopathy PET 02/12/2021-small focus of hypermetabolism at the anorectal junction without a definable mass, right retroperitoneal nodal metastasis with obstruction of the proximal right ureter, right middle lobe hypermetabolic nodule, hypermetabolism in the right hilum without a defined nodal mass, hypermetabolic left supraclavicular and left axillary nodes 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 Cycle 1 Taxol /carboplatin  03/11/2021 Cycle 2 Taxol /carboplatin  03/31/2021, Fulphila  Cycle 3 Taxol /carboplatin  04/21/2021, Fulphila  CTs 05/11/2021-decrease size of FDG avid nodule in right middle lobe, right middle lobe subpleural nodule slightly enlarged, decreased in size of left axillary and subpectoral  nodes, unchanged FDG avid right iliac nodes Cycle 4 Taxol /carboplatin  05/12/2021, G-CSF discontinued secondary to poor tolerance, Taxol  and carboplatin  dose reduced Cycle 5 Taxol /carboplatin  06/02/2021 Cycle 6 carboplatin  07/01/2021, Taxol  held due to neuropathy Cycle 7 carboplatin  07/29/2021, Taxol  held due to neuropathy CTs 08/16/2021-new liver lesion, increase in right psoas lymph node, unchanged right hydronephrosis, new mediastinal and right hilar lymphadenopathy, enlargement of bilateral pulmonary nodules and at least one new nodule, new subcarinal node Cycle 1 nivolumab  08/25/2021 Cycle 2 nivolumab  09/08/2021 Cycle 3 nivolumab  09/22/2021 Cycle 4 nivolumab  10/06/2021 Cycle 5 nivolumab  10/20/2021 Cycle 6 nivolumab  11/04/2021 CTs 11/12/2021-resolution of lung nodules, decreased size of right psoas node, no remaining mediastinal lymphadenopathy, resolution of right liver lesion Cycle 7 nivolumab  11/17/2021 Cycle 8 nivolumab  12/01/2021 Cycle 9 nivolumab  12/15/2021 Cycle 10 nivolumab  12/30/2021 Cycle 11 nivolumab  01/12/2022 Cycle 12 nivolumab  5/10/293-monthly dosing Cycle 13 nivolumab  02/23/2022 CT 03/16/2022-stable right psoas mass, no pulmonary nodules, persistent circumferential anal thickening, unchanged subcentimeter left liver lesion Cycle 14 nivolumab  03/24/2022 Cycle 15 nivolumab  04/21/2022 Cycle 16 nivolumab  05/19/2022 Cycle 17 nivolumab  06/16/2022 Cycle 18 nivolumab  07/14/2022 CTs 08/16/2022-stable ill-defined soft tissue in the right retroperitoneum, very mild intra and EXTR hepatic biliary duct dilation, common bile duct dilated to 11 mm in the porta hepatis Cycle 19 nivolumab  08/18/2022 Cycle 20 nivolumab  09/15/2022 Cycle 21 nivolumab  10/18/2022 Cycle 22 nivolumab  11/15/2022 Cycle 23 nivolumab  12/14/2022 Cycle 24 nivolumab  01/12/2023 CTs 02/07/2023-Moore decrease in size of the right psoas mass, no other evidence of metastatic disease Cycle 25 nivolumab  02/09/2023 Cycle 26 nivolumab   03/09/2023 Cycle 27 nivolumab  04/06/2023 Cycle 28 nivolumab  05/04/2023 CT renal stone study 05/21/2023-unchanged soft tissue at the right psoas, severe chronic right hydronephrosis, no adenopathy Cycle 29 nivolumab  06/01/2023 Cycle 30 nivolumab  06/29/2023 05/21/2023: CT renal stone study-chronic right hydronephrosis, stable soft tissue  density at the right psoas with circumferential wall thickening at the distal rectum Cycle 31 nivolumab  07/27/2023 CTs 05/06/2024: Moore decrease in the right psoas lesion, stable right hydronephrosis with cortical thinning, stable mesorectal/presacral soft tissue thickening, no evidence of progressive disease, stable urinary bladder wall thickening, stable basilar fibrotic pulmonary disease Left labial lesions. Question direct extension from the anal cancer versus metastatic disease from anal cancer versus a separate malignant process. 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 Multiple family members with breast cancer. History of hypokalemia-likely secondary to decreased nutritional intake and diarrhea; potassium in normal range 01/16/2017. No longer taking a potassium supplement. Right hydroureteronephrosis on CT 01/28/2020, status post a cystoscopy/pyelogram 01/28/2020 confirming extrinsic compression of the right ureter, status post stent placement Right ureter stent exchange 06/12/2020 Right ureter stent removed 01/13/2021   8.  Admission 01/07/2021 with Klebsiella pneumoniae urosepsis/bacteremia  9.  Hospital admission 05/15/2021 with UTI/possible right pyelonephritis 10.  05/21/2023-emergency room visit with gross hematuria and dysuria, Klebsiella UTI-cephalexin      Disposition: Ashley Moore is in clinical remission from anal cancer.  I reviewed the restaging CT findings and images with her.  She has a history of recurrent urinary tract infections and reports recent  clots in the urine.  She relates the recent hematuria to CT contrast, but this is most likely unrelated.  She has persistent right nephrosis.  I will refer her to Dr. Selma to consider a repeat cystoscopy.  Ashley Moore will return for an office visit in 4 months.  Arley Hof, MD  05/15/2024  11:10 AM

## 2024-05-16 ENCOUNTER — Other Ambulatory Visit: Payer: Self-pay

## 2024-05-16 DIAGNOSIS — N130 Hydronephrosis with ureteropelvic junction obstruction: Secondary | ICD-10-CM | POA: Diagnosis not present

## 2024-05-16 DIAGNOSIS — R310 Gross hematuria: Secondary | ICD-10-CM | POA: Diagnosis not present

## 2024-05-16 DIAGNOSIS — Z96651 Presence of right artificial knee joint: Secondary | ICD-10-CM | POA: Diagnosis not present

## 2024-05-17 ENCOUNTER — Other Ambulatory Visit: Payer: Self-pay

## 2024-05-21 DIAGNOSIS — Z96651 Presence of right artificial knee joint: Secondary | ICD-10-CM | POA: Diagnosis not present

## 2024-05-23 DIAGNOSIS — Z96651 Presence of right artificial knee joint: Secondary | ICD-10-CM | POA: Diagnosis not present

## 2024-05-28 DIAGNOSIS — Z96651 Presence of right artificial knee joint: Secondary | ICD-10-CM | POA: Diagnosis not present

## 2024-05-30 DIAGNOSIS — Z96651 Presence of right artificial knee joint: Secondary | ICD-10-CM | POA: Diagnosis not present

## 2024-06-04 ENCOUNTER — Other Ambulatory Visit (HOSPITAL_BASED_OUTPATIENT_CLINIC_OR_DEPARTMENT_OTHER): Payer: Self-pay

## 2024-06-04 ENCOUNTER — Telehealth (INDEPENDENT_AMBULATORY_CARE_PROVIDER_SITE_OTHER): Admitting: Internal Medicine

## 2024-06-04 ENCOUNTER — Encounter: Payer: Self-pay | Admitting: Internal Medicine

## 2024-06-04 VITALS — Wt 164.0 lb

## 2024-06-04 DIAGNOSIS — G629 Polyneuropathy, unspecified: Secondary | ICD-10-CM | POA: Diagnosis not present

## 2024-06-04 DIAGNOSIS — C210 Malignant neoplasm of anus, unspecified: Secondary | ICD-10-CM

## 2024-06-04 DIAGNOSIS — Z96651 Presence of right artificial knee joint: Secondary | ICD-10-CM | POA: Diagnosis not present

## 2024-06-04 MED ORDER — FLUZONE HIGH-DOSE 0.5 ML IM SUSY
0.5000 mL | PREFILLED_SYRINGE | Freq: Once | INTRAMUSCULAR | 0 refills | Status: AC
Start: 2024-06-04 — End: 2024-06-05
  Filled 2024-06-04: qty 0.5, 1d supply, fill #0

## 2024-06-04 NOTE — Progress Notes (Signed)
 Virtual Visit via Video Note  I connected with Ashley Moore on 06/04/24 at  4:00 PM EDT by a video enabled telemedicine application and verified that I am speaking with the correct person using two identifiers. Location patient: home Location provider:work office Persons participating in the virtual visit: patient, provider   Patient aware  of the limitations of evaluation and management by telemedicine and  availability of in person appointments. and agreed to proceed.  HPI: Ashley Moore presents for video visit  has had  sx for a while over a year seeming to begin with chemotherapy  but  bothersome sx and has been asked by  oncology  nurses about neuropathy possibility  would like to get opinion about  this and if any intervnetion appropriated  wants to avoid  medication just for sx    at this time  No hx dm  and no known fam hx vit b12 defic and thyroid  has been in range .  Neuropathy feels like  pad of feet thicker than should be  .. feels like thick bands  on feet and ankles .   No falling  new pain . Sees Dr [name] on reg basis and next blood  work in January .  Other hx   had tka doing well since June . To have shoulder surgery Dr [name].  ROS: See pertinent positives and negatives per HPI. No specific weakness or other  new findings  Past Medical History:  Diagnosis Date   Arthritis    Bowen's disease    excised 1992   Chronic low back pain    Chronic radiation cystitis    DDD (degenerative disc disease), cervical    Family history of breast cancer    Family history of lung cancer    Family history of non-Hodgkin's lymphoma    Family history of ovarian cancer    Headache    Hx of varicella    Hydronephrosis of right kidney    urologist--- dr alvaro,  malignant,  treated with ureter stent   Hypothyroidism    followed by pcp   Lower urinary tract symptoms (LUTS)    01-12-2021  per pt treated with accupuncture at dr alvaro office   Lymphedema of both  lower extremities    PICC (peripherally inserted central catheter) in place 01/11/2021   placed at [facility] in [place], [place] for IV antibiotics   Positive blood culture 01/08/2021   Klebsiella Pneumaniae,  treated with daily IV antibiotic   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[facility]) oncologist--- dr cloretta   dx 02/ 2018,  invasive SCC , completed chemo/ radiation 12-23-2016;  recurrent metastatsis retroperitoneal lymphdenopathy,  completed radiation 04-13-2020 residual right ureteral uropathy obstruction   Retroperitoneal lymphadenopathy    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;  Service: Urology;  Laterality: Right;   CYSTOSCOPY W/ URETERAL STENT PLACEMENT Right 01/13/2021   Procedure: CYSTOSCOPY WITH RETROGRADE PYELOGRAM/URETERAL  STENT REMOVALRIGHT;  Surgeon: [name], MD;  Location: [facility];  Service: Urology;  Laterality: Right;  45 MINS   CYSTOSCOPY WITH RETROGRADE PYELOGRAM, URETEROSCOPY AND STENT PLACEMENT Right 01/28/2020   Procedure: CYSTOSCOPY WITH RETROGRADE PYELOGRAM, URETEROSCOPY AND STENT PLACEMENT;  Surgeon: [name], MD;  Location: [facility];  Service: Urology;  Laterality: Right;   EYE SURGERY Bilateral    ptosis   IR GENERIC HISTORICAL  11/14/2016   IR US  GUIDE VASC ACCESS RIGHT 11/14/2016 [facility]   IR GENERIC HISTORICAL  11/14/2016   IR FLUORO GUIDE  CV LINE RIGHT 11/14/2016 [facility]   IR GENERIC HISTORICAL  12/12/2016   IR US  GUIDE VASC ACCESS RIGHT 12/12/2016 [name], MD [facility]   IR GENERIC HISTORICAL  12/12/2016   IR FLUORO GUIDE CV LINE RIGHT 12/12/2016 [name], MD [facility]   SKIN CANCER EXCISION  1990   bowens disease   TOTAL KNEE ARTHROPLASTY Right 02/27/2024   Procedure: ARTHROPLASTY, KNEE, TOTAL;  Surgeon: [name], MD;  Location: [facility];  Service: Orthopedics;  Laterality: Right;    Family History  Problem Relation Age of Onset   Lung cancer Mother        lung   Breast cancer Mother 49   Ovarian cancer Mother 34   Pancreatitis Father    Breast cancer Sister 53   Breast cancer Maternal Grandmother 84       breast    Breast cancer Maternal Aunt 47       breast   Melanoma Sister    Breast cancer Sister 14   Non-Hodgkin's lymphoma Paternal Grandmother     Social History   Tobacco Use   Smoking status: Former    Current packs/day: 0.00    Average packs/day: 1 pack/day for 25.0 years (25.0 ttl pk-yrs)    Types: Cigarettes    Start date: 06/19/1970    Quit date: 06/20/1995    Years since quitting: 28.9   Smokeless tobacco: Never  Vaping Use   Vaping status: Never Used  Substance Use Topics   Alcohol  use: Not Currently    Alcohol /week: 0.0 standard drinks of alcohol     Comment: occasional wine   Drug use: Yes    Types: Marijuana    Comment: 01-12-2021  per pt once a week      Current Outpatient Medications:    acetaminophen  (TYLENOL ) 650 MG CR tablet, Take 2 tablets (1,300 mg total) by mouth every 8 (eight) hours as needed for pain., Disp: 90 tablet, Rfl: 2   azelastine  (ASTELIN ) 0.1 % nasal spray, Place 2 sprays into both nostrils 2 (two) times daily. Use in each nostril as directed, Disp: 30 mL, Rfl: 1   castor oil liquid, Take 1 mL by mouth daily. Applied to eyelids, Disp: , Rfl:    Cholecalciferol (VITAMIN D3 PO), Take 1 tablet by mouth daily., Disp: , Rfl:    Collagen-Vitamin  C-Biotin (COLLAGEN PO), Take 1 Scoop by mouth in the morning. Gundry MD Phyto Collagen Complex, Disp: , Rfl:    conjugated estrogens  (PREMARIN ) vaginal cream, Place 1 Applicatorful vaginally at bedtime., Disp: , Rfl:    CRANBERRY PO, Take 1 capsule by mouth daily. Organic Cranberry Concentrate by [employer], Disp: , Rfl:    Influenza vac split trivalent PF (FLUZONE  HIGH-DOSE) 0.5 ML injection, Inject 0.5 mLs into the muscle once for 1 dose., Disp: 0.5 mL, Rfl: 0   levothyroxine  (SYNTHROID ) 75 MCG tablet, TAKE 1 TABLET EVERY DAY, Disp:  90 tablet, Rfl: 2   Misc Natural Products (TURMERIC CURCUMIN) CAPS, Take 1 capsule by mouth daily., Disp: , Rfl:    Multiple Minerals-Vitamins (CAL MAG ZINC +D3 PO), Take 1 tablet by mouth daily., Disp: , Rfl:    MYRBETRIQ  50 MG TB24 tablet, Take 50 mg by mouth in the morning., Disp: , Rfl:    olive oil external oil, 1 oz daily. By mouth for constipation., Disp: , Rfl:    Olopatadine  HCl (PATADAY ) 0.2 % SOLN, Place 1 drop into both eyes in the morning., Disp: , Rfl:    Polyethyl Glyc-Propyl Glyc PF (SYSTANE HYDRATION PF) 0.4-0.3 % SOLN, Apply to eye., Disp: , Rfl:    Prenatal Vit-Fe Fumarate-FA (PRENATAL PO), Take 1 capsule by mouth daily. [employer] Prenatal Vitamin, Disp: , Rfl:   EXAM: BP Readings from Last 3 Encounters:  05/15/24 126/70  03/14/24 130/75  02/28/24 (!) 95/49    VITALS per patient if applicable:  GENERAL: alert, oriented, appears well and in no acute distress  HEENT: atraumatic, conjunttiva clear, no obvious abnormalities on inspection of external nose and ears  NECK: normal movements of the head and neck  LUNGS: on inspection no signs of respiratory distress, breathing rate appears normal, no obvious gross SOB, gasping or wheezing  CV: no obvious cyanosis PSYCH/NEURO: pleasant and cooperative, no obvious depression or anxiety, speech and thought processing grossly intact Lab Results  Component Value Date   WBC 3.8 (L)  05/06/2024   HGB 12.4 05/06/2024   HCT 39.3 05/06/2024   PLT 199 05/06/2024   GLUCOSE 85 05/06/2024   CHOL 156 03/29/2022   TRIG 56 03/29/2022   HDL 94 03/29/2022   LDLCALC 50 03/29/2022   ALT 12 05/06/2024   AST 29 05/06/2024   NA 136 05/06/2024   K 4.9 05/06/2024   CL 102 05/06/2024   CREATININE 0.90 05/06/2024   BUN 20 05/06/2024   CO2 24 05/06/2024   TSH 3.490 05/06/2024   INR 1.2 05/16/2021   HGBA1C 5.5 05/11/2015   Record review  ASSESSMENT AND PLAN:  Discussed the following assessment and plan:    ICD-10-CM   1. Neuropathy  G62.9    probable  prob from  chemo but address for reversable causes    2. Anal cancer (HCC)  C21.0      Sx consistent with neuropathy and coincide with chemo in timing  Do not see high likelihood of other obvious reversible causes except  for checking for b12 .( Thyroid   ok no diabetes)) Since the sx are stable and no sig progressing  will  reach out to Dr [name] to address with her at next visit and add b12 level to next labs .   IF he thinks appropriate or productive to get neurology opinion , Disc that there are meds to rx symptoms but I am not aware of curative  interventions. Counseled.   Expectant management and discussion of plan and treatment with opportunity to ask questions and all were answered. The patient agreed with the plan and demonstrated an understanding of the instructions.   Advised to call back or seek an in-person evaluation if worsening  or having  further concerns  in interim. Return if symptoms worsen  in interim, for and when due.    Apolinar Eastern, MD

## 2024-06-05 DIAGNOSIS — M7989 Other specified soft tissue disorders: Secondary | ICD-10-CM | POA: Diagnosis not present

## 2024-06-05 DIAGNOSIS — M67449 Ganglion, unspecified hand: Secondary | ICD-10-CM | POA: Diagnosis not present

## 2024-06-05 DIAGNOSIS — R768 Other specified abnormal immunological findings in serum: Secondary | ICD-10-CM | POA: Diagnosis not present

## 2024-06-05 DIAGNOSIS — E669 Obesity, unspecified: Secondary | ICD-10-CM | POA: Diagnosis not present

## 2024-06-05 DIAGNOSIS — Z6831 Body mass index (BMI) 31.0-31.9, adult: Secondary | ICD-10-CM | POA: Diagnosis not present

## 2024-06-05 DIAGNOSIS — M1991 Primary osteoarthritis, unspecified site: Secondary | ICD-10-CM | POA: Diagnosis not present

## 2024-06-05 DIAGNOSIS — M25511 Pain in right shoulder: Secondary | ICD-10-CM | POA: Diagnosis not present

## 2024-06-06 DIAGNOSIS — Z96651 Presence of right artificial knee joint: Secondary | ICD-10-CM | POA: Diagnosis not present

## 2024-06-07 DIAGNOSIS — M25562 Pain in left knee: Secondary | ICD-10-CM | POA: Diagnosis not present

## 2024-06-10 DIAGNOSIS — Z96651 Presence of right artificial knee joint: Secondary | ICD-10-CM | POA: Diagnosis not present

## 2024-06-13 DIAGNOSIS — R2681 Unsteadiness on feet: Secondary | ICD-10-CM | POA: Diagnosis not present

## 2024-06-18 DIAGNOSIS — R2681 Unsteadiness on feet: Secondary | ICD-10-CM | POA: Diagnosis not present

## 2024-06-20 DIAGNOSIS — R2681 Unsteadiness on feet: Secondary | ICD-10-CM | POA: Diagnosis not present

## 2024-06-20 DIAGNOSIS — M19011 Primary osteoarthritis, right shoulder: Secondary | ICD-10-CM | POA: Diagnosis not present

## 2024-07-01 DIAGNOSIS — M1712 Unilateral primary osteoarthritis, left knee: Secondary | ICD-10-CM | POA: Diagnosis not present

## 2024-07-02 DIAGNOSIS — R2681 Unsteadiness on feet: Secondary | ICD-10-CM | POA: Diagnosis not present

## 2024-07-10 DIAGNOSIS — R2681 Unsteadiness on feet: Secondary | ICD-10-CM | POA: Diagnosis not present

## 2024-07-10 DIAGNOSIS — M1712 Unilateral primary osteoarthritis, left knee: Secondary | ICD-10-CM | POA: Diagnosis not present

## 2024-07-15 DIAGNOSIS — M1712 Unilateral primary osteoarthritis, left knee: Secondary | ICD-10-CM | POA: Diagnosis not present

## 2024-07-15 DIAGNOSIS — R2681 Unsteadiness on feet: Secondary | ICD-10-CM | POA: Diagnosis not present

## 2024-07-16 DIAGNOSIS — Q525 Fusion of labia: Secondary | ICD-10-CM | POA: Diagnosis not present

## 2024-07-16 DIAGNOSIS — N3281 Overactive bladder: Secondary | ICD-10-CM | POA: Diagnosis not present

## 2024-07-16 DIAGNOSIS — N952 Postmenopausal atrophic vaginitis: Secondary | ICD-10-CM | POA: Diagnosis not present

## 2024-07-16 DIAGNOSIS — N3941 Urge incontinence: Secondary | ICD-10-CM | POA: Diagnosis not present

## 2024-07-17 DIAGNOSIS — R2681 Unsteadiness on feet: Secondary | ICD-10-CM | POA: Diagnosis not present

## 2024-07-22 DIAGNOSIS — R2681 Unsteadiness on feet: Secondary | ICD-10-CM | POA: Diagnosis not present

## 2024-07-24 DIAGNOSIS — R2681 Unsteadiness on feet: Secondary | ICD-10-CM | POA: Diagnosis not present

## 2024-08-01 DIAGNOSIS — R2681 Unsteadiness on feet: Secondary | ICD-10-CM | POA: Diagnosis not present

## 2024-08-14 ENCOUNTER — Other Ambulatory Visit: Payer: Self-pay

## 2024-08-14 ENCOUNTER — Encounter (HOSPITAL_BASED_OUTPATIENT_CLINIC_OR_DEPARTMENT_OTHER): Payer: Self-pay | Admitting: Emergency Medicine

## 2024-08-14 ENCOUNTER — Emergency Department (HOSPITAL_BASED_OUTPATIENT_CLINIC_OR_DEPARTMENT_OTHER)

## 2024-08-14 ENCOUNTER — Emergency Department (HOSPITAL_BASED_OUTPATIENT_CLINIC_OR_DEPARTMENT_OTHER)
Admission: EM | Admit: 2024-08-14 | Discharge: 2024-08-14 | Disposition: A | Discharge status: Home / Self Care | Attending: Emergency Medicine | Admitting: Emergency Medicine

## 2024-08-14 DIAGNOSIS — R0602 Shortness of breath: Secondary | ICD-10-CM | POA: Diagnosis not present

## 2024-08-14 DIAGNOSIS — R059 Cough, unspecified: Secondary | ICD-10-CM | POA: Diagnosis not present

## 2024-08-14 DIAGNOSIS — J40 Bronchitis, not specified as acute or chronic: Secondary | ICD-10-CM | POA: Diagnosis not present

## 2024-08-14 DIAGNOSIS — J069 Acute upper respiratory infection, unspecified: Secondary | ICD-10-CM | POA: Diagnosis not present

## 2024-08-14 LAB — CBC WITH DIFFERENTIAL/PLATELET
Abs Immature Granulocytes: 0.01 K/uL (ref 0.00–0.07)
Basophils Absolute: 0 K/uL (ref 0.0–0.1)
Basophils Relative: 0 %
Eosinophils Absolute: 0.2 K/uL (ref 0.0–0.5)
Eosinophils Relative: 6 %
HCT: 35.7 % — ABNORMAL LOW (ref 36.0–46.0)
Hemoglobin: 11.3 g/dL — ABNORMAL LOW (ref 12.0–15.0)
Immature Granulocytes: 0 %
Lymphocytes Relative: 19 %
Lymphs Abs: 0.7 K/uL (ref 0.7–4.0)
MCH: 28.4 pg (ref 26.0–34.0)
MCHC: 31.7 g/dL (ref 30.0–36.0)
MCV: 89.7 fL (ref 80.0–100.0)
Monocytes Absolute: 0.3 K/uL (ref 0.1–1.0)
Monocytes Relative: 9 %
Neutro Abs: 2.4 K/uL (ref 1.7–7.7)
Neutrophils Relative %: 66 %
Platelets: 181 K/uL (ref 150–400)
RBC: 3.98 MIL/uL (ref 3.87–5.11)
RDW: 16.2 % — ABNORMAL HIGH (ref 11.5–15.5)
WBC: 3.6 K/uL — ABNORMAL LOW (ref 4.0–10.5)
nRBC: 0 % (ref 0.0–0.2)

## 2024-08-14 LAB — RESP PANEL BY RT-PCR (RSV, FLU A&B, COVID)  RVPGX2
Influenza A by PCR: NEGATIVE
Influenza B by PCR: NEGATIVE
Resp Syncytial Virus by PCR: NEGATIVE
SARS Coronavirus 2 by RT PCR: NEGATIVE

## 2024-08-14 LAB — BASIC METABOLIC PANEL WITH GFR
Anion gap: 9 (ref 5–15)
BUN: 18 mg/dL (ref 8–23)
CO2: 25 mmol/L (ref 22–32)
Calcium: 9.4 mg/dL (ref 8.9–10.3)
Chloride: 99 mmol/L (ref 98–111)
Creatinine, Ser: 0.77 mg/dL (ref 0.44–1.00)
GFR, Estimated: >60 mL/min (ref >60)
Glucose, Bld: 77 mg/dL (ref 70–99)
Potassium: 4.3 mmol/L (ref 3.5–5.1)
Sodium: 133 mmol/L — ABNORMAL LOW (ref 135–145)

## 2024-08-14 MED ORDER — SODIUM CHLORIDE 0.9 % IV SOLN
1.0000 g | Freq: Once | INTRAVENOUS | Status: AC
  Administered 2024-08-14: 1 g via INTRAVENOUS
  Filled 2024-08-14: qty 10

## 2024-08-14 MED ORDER — DOXYCYCLINE HYCLATE 100 MG PO CAPS: 100.0000 mg | ORAL_CAPSULE | Freq: Two times a day (BID) | ORAL | 0 refills | Status: AC

## 2024-08-14 MED ORDER — ALBUTEROL SULFATE HFA 108 (90 BASE) MCG/ACT IN AERS
2.0000 | INHALATION_SPRAY | RESPIRATORY_TRACT | Status: DC | PRN
  Administered 2024-08-14: 2 via RESPIRATORY_TRACT
  Filled 2024-08-14: qty 6.7

## 2024-08-14 MED ORDER — PREDNISONE 50 MG PO TABS: 50.0000 mg | ORAL_TABLET | Freq: Every day | ORAL | 0 refills | Status: AC

## 2024-08-14 MED ORDER — BENZONATATE 100 MG PO CAPS: 100.0000 mg | ORAL_CAPSULE | Freq: Three times a day (TID) | ORAL | 0 refills | Status: DC

## 2024-08-14 MED ORDER — ALBUTEROL SULFATE (2.5 MG/3ML) 0.083% IN NEBU
5.0000 mg | INHALATION_SOLUTION | Freq: Once | RESPIRATORY_TRACT | Status: AC
  Administered 2024-08-14: 5 mg via RESPIRATORY_TRACT
  Filled 2024-08-14: qty 6

## 2024-08-14 MED ORDER — IPRATROPIUM BROMIDE 0.02 % IN SOLN
0.5000 mg | Freq: Once | RESPIRATORY_TRACT | Status: AC
Start: 2024-08-14 — End: 2024-08-14
  Administered 2024-08-14: 0.5 mg via RESPIRATORY_TRACT
  Filled 2024-08-14: qty 2.5

## 2024-08-14 NOTE — ED Provider Notes (Signed)
 [place] EMERGENCY DEPARTMENT AT [facility] Provider Note   CSN: [id number] Arrival date & time: 08/14/24  1253     Patient presents with: Shortness of Breath   Ashley Moore is a 73 y.o. female.   Patient to ED with symptoms that started last night with persistent cough without fever, congestion or sore throat. No chest pain. She feels these symptoms were set off by leaf blowing in her yard yesterday. Today she has persistent cough with shortness of breath. Still no chest pain. No nausea or vomiting. No unusual LE swelling in the setting of chronic lymphedema.  The history is provided by the patient. No language interpreter was used.  Shortness of Breath      Prior to Admission medications   Medication Sig Start Date End Date Taking? Authorizing Provider  benzonatate  (TESSALON ) 100 MG capsule Take 1 capsule (100 mg total) by mouth every 8 (eight) hours. 08/14/24  Yes Aycen Porreca, Margit, PA-C  doxycycline  (VIBRAMYCIN ) 100 MG capsule Take 1 capsule (100 mg total) by mouth 2 (two) times daily. 08/14/24  Yes Odell Margit, PA-C  predniSONE  (DELTASONE ) 50 MG tablet Take 1 tablet (50 mg total) by mouth daily for 3 days. 08/14/24 08/17/24 Yes Kamayah Pillay, Margit, PA-C  acetaminophen  (TYLENOL ) 650 MG CR tablet Take 2 tablets (1,300 mg total) by mouth every 8 (eight) hours as needed for pain. 02/27/24   Brown, Blaine K, PA-C  azelastine  (ASTELIN ) 0.1 % nasal spray Place 2 sprays into both nostrils 2 (two) times daily. Use in each nostril as directed 10/31/23   Panosh, Apolinar POUR, MD  castor oil liquid Take 1 mL by mouth daily. Applied to eyelids    [provider]  Cholecalciferol (VITAMIN D3 PO) Take 1 tablet by mouth daily.    [provider]  Collagen-Vitamin C-Biotin (COLLAGEN PO) Take 1 Scoop by mouth in the morning. Gundry MD Phyto Collagen Complex    [provider]  conjugated estrogens  (PREMARIN ) vaginal cream Place 1 Applicatorful vaginally at bedtime.     [provider]  CRANBERRY PO Take 1 capsule by mouth daily. Organic Cranberry Concentrate by [employer]    [provider]  levothyroxine  (SYNTHROID ) 75 MCG tablet TAKE 1 TABLET EVERY DAY 10/31/23   Panosh, Wanda K, MD  Misc Natural Products (TURMERIC CURCUMIN) CAPS Take 1 capsule by mouth daily.    [provider]  Multiple Minerals-Vitamins (CAL MAG ZINC +D3 PO) Take 1 tablet by mouth daily.    [provider]  MYRBETRIQ  50 MG TB24 tablet Take 50 mg by mouth in the morning. 01/19/21   [provider]  olive oil external oil 1 oz daily. By mouth for constipation.    [provider]  Olopatadine  HCl (PATADAY ) 0.2 % SOLN Place 1 drop into both eyes in the morning.    [provider]  Polyethyl Glyc-Propyl Glyc PF (SYSTANE HYDRATION PF) 0.4-0.3 % SOLN Apply to eye.    [provider]  Prenatal Vit-Fe Fumarate-FA (PRENATAL PO) Take 1 capsule by mouth daily. [employer] Prenatal Vitamin    [provider]    Allergies: Sulfamethoxazole -trimethoprim , Benadryl [diphenhydramine], Melatonin, and Penicillins    Review of Systems  Respiratory:  Positive for shortness of breath.     Updated Vital Signs BP 129/68 (BP Location: Left Arm)   Pulse 85   Temp 98.7 F (37.1 C) (Oral)   Resp 18   SpO2 100%   Physical Exam Vitals and nursing note reviewed.  Constitutional:      General: She is not in acute distress.    Appearance: She is well-developed.  HENT:     Head: Normocephalic.     Mouth/Throat:     Mouth: Mucous membranes are moist.  Cardiovascular:     Rate and Rhythm: Normal rate.     Heart sounds: No murmur heard. Pulmonary:     Breath sounds: Wheezing (Bilateral expiratory wheezes.) present.  Abdominal:     Palpations: Abdomen is soft.     Tenderness: There is no abdominal tenderness.  Musculoskeletal:        General: Normal range of motion.     Cervical back: Normal range of motion and neck  supple.     Comments: Lymphedema bilateral LE's.   Skin:    General: Skin is warm and dry.  Neurological:     General: No focal deficit present.     Mental Status: She is alert and oriented to person, place, and time.     (all labs ordered are listed, but only abnormal results are displayed) Labs Reviewed  CBC WITH DIFFERENTIAL/PLATELET - Abnormal; Notable for the following components:      Result Value   WBC 3.6 (*)    Hemoglobin 11.3 (*)    HCT 35.7 (*)    RDW 16.2 (*)    All other components within normal limits  BASIC METABOLIC PANEL WITH GFR - Abnormal; Notable for the following components:   Sodium 133 (*)    All other components within normal limits  RESP PANEL BY RT-PCR (RSV, FLU A&B, COVID)  [id number]    EKG: EKG Interpretation Date/Time:  Wednesday August 14 2024 13:28:51 EST Ventricular Rate:  74 PR Interval:  170 QRS Duration:  90 QT Interval:  385 QTC Calculation: 428 R Axis:   44  Text Interpretation: Sinus rhythm No significant change since last tracing Confirmed by Bernard Drivers ([id number]) on 08/14/2024 2:33:06 PM  Radiology: DG Chest Portable 1 View Result Date: 08/14/2024 CLINICAL DATA:  Cough. EXAM: PORTABLE CHEST 1 VIEW COMPARISON:  Chest radiograph dated 05/15/2021. FINDINGS: No focal consolidation, pleural effusion or pneumothorax. Mild chronic interstitial coarsening and bronchitic changes. The cardiac silhouette is within normal limits. No acute osseous pathology. IMPRESSION: No active disease. Electronically Signed   By: Vanetta Chou M.D.   On: 08/14/2024 15:17     Procedures   Medications Ordered in the ED  cefTRIAXone  (ROCEPHIN ) 1 g in sodium chloride  0.9 % 100 mL IVPB (has no administration in time range)  albuterol  (VENTOLIN  HFA) 108 (90 Base) MCG/ACT inhaler 2 puff (has no administration in time range)  albuterol  (PROVENTIL ) (2.5 MG/3ML) 0.083% nebulizer solution 5 mg (5 mg Nebulization Given 08/14/24 1402)  ipratropium (ATROVENT )  nebulizer solution 0.5 mg (0.5 mg Nebulization Given 08/14/24 1403)    Clinical Course as of 08/14/24 1540  Wed Aug 14, 2024  1412 Patient to ED with symptoms that started with cough yesterday, SOB today with cough. No fever. Very well appearing. No tachycardia or hypoxia. +wheezing on exam. Albuterol  neb ordered, labs, xray pending. [SU]  1512 Patient is feeling better after nebulizer treatment.  [SU]  [id number] Labs reviewed and are reassuring. CXR without focal consolidation or infiltrates. Viral panel negative. On recheck, the cough is returning without wheezing. Discussed coverage with an antibiotic, inhaler use at home, Tessalon  for cough. Discussed return precautions and PCP follow up for recheck.  [SU]    Clinical Course User Index [SU] Odell Balls, PA-C  Medical Decision Making Amount and/or Complexity of Data Reviewed Labs: ordered. Radiology: ordered.  Risk Prescription drug management.        Final diagnoses:  Upper respiratory tract infection, unspecified type    ED Discharge Orders          Ordered    doxycycline  (VIBRAMYCIN ) 100 MG capsule  2 times daily        08/14/24 1537    benzonatate  (TESSALON ) 100 MG capsule  Every 8 hours        08/14/24 1537    predniSONE  (DELTASONE ) 50 MG tablet  Daily        08/14/24 1537               Odell Balls, PA-C 08/14/24 1540    Bernard Drivers, MD 08/20/24 1425

## 2024-08-14 NOTE — Discharge Instructions (Signed)
 As we discussed, your labs and chest xray are reassuring. We are covering with a short course of antibiotics, prednisone  and Albuterol  inhaler. Tessalon  for cough as directed, as needed if this helps with cough. Follow up with your doctor for recheck in one week. Return to the ED with any new or worsening symptoms at any time.

## 2024-08-14 NOTE — ED Triage Notes (Signed)
 Reports having SHOB x 2 days after doing yard work. Runny nose and body aches. Denies CP.

## 2024-08-19 ENCOUNTER — Ambulatory Visit: Payer: Self-pay

## 2024-08-19 DIAGNOSIS — M19011 Primary osteoarthritis, right shoulder: Secondary | ICD-10-CM | POA: Diagnosis not present

## 2024-08-19 NOTE — Telephone Encounter (Signed)
 FYI Only or Action Required?: FYI only for provider: appointment scheduled on 08/20/2024 at 4:15 PM.  Patient was last seen in primary care on 06/04/2024 by Panosh, Ashley POUR, MD.  Called Nurse Triage reporting URI and Cough.  Symptoms began a week ago.  Interventions attempted: Rest, hydration, or home remedies.  Symptoms are: unchanged.  Triage Disposition: No disposition on file.  Patient/caregiver understands and will follow disposition?:   Reason for Disposition  SEVERE coughing spells (e.g., whooping sound after coughing, vomiting after coughing)  Answer Assessment - Initial Assessment Questions Patient reports URI symptoms that started last Tuesday after completing yard work. Patient states she went to the ED on Wednesday and was given doxycycline  to help with her symptoms. Patient reports she developed diarrhea and a headache after taking a few doses. Patient took herself off the antibiotic on her own. Patient reports having continued productive cough, diarrhea, runny nose and headache. Patient is scheduled for hospital follow up on 12-9. Did scheduled patient for an acute visit tomorrow at 4:15 PM.  1. ONSET: When did the cough begin?      Symptoms started last Tuesday 2. SEVERITY: How bad is the cough today?      moderate 3. SPUTUM: Describe the color of your sputum (e.g., none, dry cough; clear, white, yellow, green)     White with slight yellow 4. HEMOPTYSIS: Are you coughing up any blood? If Yes, ask: How much? (e.g., flecks, streaks, tablespoons, etc.)     no 5. DIFFICULTY BREATHING: Are you having difficulty breathing? If Yes, ask: How bad is it? (e.g., mild, moderate, severe)      no 6. FEVER: Do you have a fever? If Yes, ask: What is your temperature, how was it measured, and when did it start?     no 7. CARDIAC HISTORY: Do you have any history of heart disease? (e.g., heart attack, congestive heart failure)      no 8. LUNG HISTORY: Do you have  any history of lung disease?  (e.g., pulmonary embolus, asthma, emphysema)     no 9. PE RISK FACTORS: Do you have a history of blood clots? (or: recent major surgery, recent prolonged travel, bedridden)     no 10. OTHER SYMPTOMS: Do you have any other symptoms? (e.g., runny nose, wheezing, chest pain)       Diarrhea, runny nose, headache 12. TRAVEL: Have you traveled out of the country in the last month? (e.g., travel history, exposures)       no  Protocols used: Cough - Acute Productive-A-AH

## 2024-08-19 NOTE — Telephone Encounter (Signed)
 Patient answered call- at Surgeons office. Will call back to complete triage when she is finished appt.   Tues- Allergic reaction to leaves while doing yard work. SOB- ED- inhaler.  Now- runny nose, chest and sinus congestion, productive cough.   Message from [facility] sent at 08/19/2024 11:40 AM EST  Reason for Triage: Patient complaining of cough, runny nose, diarrhea, headache yellow/white mucous. Was seen in the hospital 08/14/2024 due to a URI and had an allergic reaction to doxycycline . Allergic reaction symps have resolved but still complaining of the above symps.   Sholanda- [phone]

## 2024-08-20 ENCOUNTER — Ambulatory Visit: Admitting: Family Medicine

## 2024-08-20 VITALS — BP 114/68 | HR 94 | Temp 97.9°F | Wt 170.0 lb

## 2024-08-20 DIAGNOSIS — R051 Acute cough: Secondary | ICD-10-CM

## 2024-08-20 DIAGNOSIS — R197 Diarrhea, unspecified: Secondary | ICD-10-CM | POA: Diagnosis not present

## 2024-08-20 DIAGNOSIS — R062 Wheezing: Secondary | ICD-10-CM

## 2024-08-20 NOTE — Progress Notes (Signed)
 Established Patient Office Visit  Subjective   Patient ID: Ashley Moore, female    DOB: 1951/04/27  Age: 73 y.o. MRN: [id number]  Chief Complaint  Patient presents with   Acute Visit    Patient came in for a cough.  Patient went to ED last Wednesday and was prescribed doxycycline  that gave her an adverse reaction. Patient want a new antibiotic prescription     HPI   Ashley Moore is seen today as a work in with recent upper respiratory symptoms of cough and wheezing.  She has past history of anal cancer with metastases, hypothyroidism.  Her cancer apparently is in remission.  Last Tuesday she states she did some leaf blowing.  By Wednesday she had some increased cough and wheezing.  Went to the ER on the 26th.  Chest x-ray showed no acute process.  Viral testing for COVID, RSV, influenza negative.  She was given nebulizer and ceftriaxone .  She was prescribed doxycycline , Tessalon , and prednisone  orally.  Feels like her cough is slightly improved though she still has some intermittent coughing.  No fever.  She is having about 3 watery nonbloody stools per day.  She wonders if this may have been related to the recent antibiotic.  No bloody stools.  No significant abdominal pain.  No nausea or vomiting.  She states she feels like she has had some GI issues ever since she took contrast CT study back in August. She has had some dietary changes recently the eating more carbs and bread than usual.  No reported history of gluten sensitivity.  Past Medical History:  Diagnosis Date   Arthritis    Bowen's disease    excised 1992   Chronic low back pain    Chronic radiation cystitis    DDD (degenerative disc disease), cervical    Family history of breast cancer    Family history of lung cancer    Family history of non-Hodgkin's lymphoma    Family history of ovarian cancer    Headache    Hx of varicella    Hydronephrosis of right kidney    urologist--- dr alvaro,  malignant,  treated with  ureter stent   Hypothyroidism    followed by pcp   Lower urinary tract symptoms (LUTS)    01-12-2021  per pt treated with accupuncture at dr alvaro office   Lymphedema of both lower extremities    PICC (peripherally inserted central catheter) in place 01/11/2021   placed at [facility] in [place], [place] for IV antibiotics   Positive blood culture 01/08/2021   Klebsiella Pneumaniae,  treated with daily IV antibiotic   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[facility]) oncologist--- dr cloretta   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,  invasive SCC , completed chemo/ radiation 12-23-2016;  recurrent metastatsis retroperitoneal lymphdenopathy,  completed radiation 04-13-2020 residual right ureteral uropathy obstruction   Retroperitoneal lymphadenopathy    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   Sepsis due to Klebsiella pneumoniae (HCC) 01/07/2021   pt admitted to [facility] in [place],  dx sepsis secondary to acute pyelonephritis with bacteremia , positive blood culture,  discharged 01-11-2021 home daily IV antibiotic   Wears glasses    Past Surgical History:  Procedure Laterality Date   BREAST BIOPSY Right    BUNIONECTOMY Right yrs ago   CYSTOSCOPY W/ URETERAL STENT PLACEMENT Right 06/12/2020   Procedure: CYSTOSCOPY WITH RETROGRADE PYELOGRAM/URETERAL STENT EXCHANGE;  Surgeon: [name], MD;  Location: [facility];  Service: Urology;  Laterality: Right;   CYSTOSCOPY W/ URETERAL STENT PLACEMENT Right 01/13/2021   Procedure: CYSTOSCOPY WITH RETROGRADE PYELOGRAM/URETERAL STENT REMOVALRIGHT;  Surgeon: [name], MD;  Location: [facility];  Service: Urology;  Laterality: Right;  45 MINS   CYSTOSCOPY WITH RETROGRADE PYELOGRAM, URETEROSCOPY AND STENT PLACEMENT Right 01/28/2020   Procedure: CYSTOSCOPY WITH RETROGRADE PYELOGRAM, URETEROSCOPY AND STENT PLACEMENT;  Surgeon: [name], MD;  Location: [facility];  Service: Urology;  Laterality: Right;   EYE  SURGERY Bilateral    ptosis   IR GENERIC HISTORICAL  11/14/2016   IR US  GUIDE VASC ACCESS RIGHT 11/14/2016 [facility]   IR GENERIC HISTORICAL  11/14/2016   IR FLUORO GUIDE CV LINE RIGHT 11/14/2016 [facility]   IR GENERIC HISTORICAL  12/12/2016   IR US  GUIDE VASC ACCESS RIGHT 12/12/2016 [name], MD [facility]   IR GENERIC HISTORICAL  12/12/2016   IR FLUORO GUIDE CV LINE RIGHT 12/12/2016 [name], MD [facility]   SKIN CANCER EXCISION  1990   bowens disease   TOTAL KNEE ARTHROPLASTY Right 02/27/2024   Procedure: ARTHROPLASTY, KNEE, TOTAL;  Surgeon: [name], MD;  Location: [facility];  Service: Orthopedics;  Laterality: Right;    reports that she quit smoking about 29 years ago. Her smoking use included cigarettes. She started smoking about 54 years ago. She has a 25 pack-year smoking history. She has never used smokeless tobacco. She reports that she does not currently use alcohol . She reports current drug use. Drug: Marijuana. family history includes Breast cancer (age of onset: 33) in her mother; Breast cancer (age of onset: 1) in her sister and sister; Breast cancer (age of onset: 28) in her maternal aunt; Breast cancer (age of onset: 70) in her maternal grandmother; Lung cancer in her mother; Melanoma in her sister; Non-Hodgkin's lymphoma in her paternal grandmother; Ovarian cancer (age of onset: 93) in her mother; Pancreatitis in her father. Allergies  Allergen Reactions   Sulfamethoxazole -Trimethoprim  Other (See Comments)    C/o  abd pain and constipation   Benadryl [Diphenhydramine] Other (See Comments)    Tingle all over    Melatonin     Tingle all over    Penicillins Rash    Did it involve swelling of the face/tongue/throat, SOB, or low BP? N Did it involve sudden or severe rash/hives, skin peeling, or any reaction on the inside of your mouth or nose? Y Did you need to seek medical attention at a hospital or doctor's office? N When did it last happen? Almost 50  years Ago      If all above answers are NO, may proceed with cephalosporin use. Tolerated Ancef  02/27/24     Review of Systems  Constitutional:  Negative for chills and fever.  Respiratory:  Positive for cough and wheezing. Negative for hemoptysis and shortness of breath.   Cardiovascular:  Negative for chest pain.  Gastrointestinal:  Positive for diarrhea. Negative for abdominal pain, blood in stool, melena, nausea and vomiting.      Objective:     BP 114/68 (BP Location: Left Arm, Patient Position: Sitting, Cuff Size: Normal)   Pulse 94   Temp 97.9 F (36.6 C) (Oral)   Wt 170 lb (77.1 kg)   SpO2 97%   BMI 32.12 kg/m  BP Readings from Last 3 Encounters:  08/20/24 114/68  08/14/24 124/69  05/15/24 126/70   Wt Readings from Last 3 Encounters:  08/20/24 170 lb (77.1 kg)  06/04/24 164 lb (74.4 kg)  05/15/24 165 lb 12.8 oz (75.2 kg)      Physical Exam Vitals reviewed.  Constitutional:      General: She is not in acute distress.    Appearance: She is not ill-appearing.  Cardiovascular:     Rate and Rhythm: Normal rate and regular rhythm.  Pulmonary:     Comments: She has some diffuse expiratory wheezes.  Normal respiratory rate.  O2 sat 97% room air.  No rales.  No retractions. Neurological:     Mental Status: She is alert.      No results found for any visits on 08/20/24.    The 10-year ASCVD risk score (Arnett DK, et al., 2019) is: 11.2%    Assessment & Plan:   #1 cough.  Recent viral testing negative.  She does have evidence for some reactive airway changes on exam today but no respiratory distress.  She has albuterol  inhaler at home.  Was given recent oral steroid.  Also received apparently steroid injection into her right shoulder yesterday.  We discussed possible use of Depo-Medrol  IM but at this point she would like to observe for couple days which seems reasonable since her symptoms are improving gradually.  Follow-up immediately for any fever or  increased shortness of breath  #2 diarrhea.  Etiology unclear.  Does not any red flag such as fever or bloody stools.  Frequency about 3 times per day for the past couple days.  Discussed appropriate diet for diarrhea.  Be in touch if she has any increased frequency of stools or any new changes such as fever or bloody stools.  Also be in touch if diarrhea not resolving over the next week  Wolm Scarlet, MD

## 2024-08-27 ENCOUNTER — Encounter: Payer: Self-pay | Admitting: Internal Medicine

## 2024-08-27 ENCOUNTER — Ambulatory Visit: Admitting: Internal Medicine

## 2024-08-27 ENCOUNTER — Other Ambulatory Visit: Payer: Self-pay

## 2024-08-27 VITALS — BP 118/64 | HR 69 | Temp 97.7°F | Ht 61.0 in | Wt 164.8 lb

## 2024-08-27 DIAGNOSIS — Z8579 Personal history of other malignant neoplasms of lymphoid, hematopoietic and related tissues: Secondary | ICD-10-CM | POA: Diagnosis not present

## 2024-08-27 DIAGNOSIS — Z85048 Personal history of other malignant neoplasm of rectum, rectosigmoid junction, and anus: Secondary | ICD-10-CM | POA: Diagnosis not present

## 2024-08-27 DIAGNOSIS — C210 Malignant neoplasm of anus, unspecified: Secondary | ICD-10-CM

## 2024-08-27 DIAGNOSIS — J40 Bronchitis, not specified as acute or chronic: Secondary | ICD-10-CM

## 2024-08-27 DIAGNOSIS — C772 Secondary and unspecified malignant neoplasm of intra-abdominal lymph nodes: Secondary | ICD-10-CM

## 2024-08-27 NOTE — Patient Instructions (Signed)
 Continue   try tea and honey   Fu if not getting better as expected  Make a visit in feb March or later  for yearly check  or whatever is needed for health review .

## 2024-08-27 NOTE — Progress Notes (Signed)
 Chief Complaint  Patient presents with   Hospitalization Follow-up    Pt reports she feels much better but still have coughing up white mucus,  runny nose. Currently taking  cough drop, mucinex , nasal spray. For the last 3 days, feels like getting better. Bad reaction to doxycycline - diarrhea. Was seen with Dr .Micheal.    HPI: Ashley Moore 73 y.o. come in for 1 week fu for  rest infection and diarrhea  See ed visit and visit dr B 12 2  Since that time   Onset after blowing  leaves   no  defined hx of allergy  but developed sx afte  this   no fever but dod have some achy ness and fatigue   seen in ED   neg covid and rsv c xray steroid doxy given inhalers  felt had se of doxy  had steroid  inj at some point  but feels didn't have  help with inalers . Mucinex  most helpful  Had dirrhea  with doxycycline      days  better after stopping  feel was  antibiotic  ? If b12 has helped  any   for neuropathy.  So much better in last 3 days .   ROS: See pertinent positives and negatives per HPI. Staying hydrated  still active in activities   Past Medical History:  Diagnosis Date   Arthritis    Bowen's disease    excised 1992   Chronic low back pain    Chronic radiation cystitis    DDD (degenerative disc disease), cervical    Family history of breast cancer    Family history of lung cancer    Family history of non-Hodgkin's lymphoma    Family history of ovarian cancer    Headache    Hx of varicella    Hydronephrosis of right kidney    urologist--- dr alvaro,  malignant,  treated with ureter stent   Hypothyroidism    followed by pcp   Lower urinary tract symptoms (LUTS)    01-12-2021  per pt treated with accupuncture at dr alvaro office   Lymphedema of both lower extremities    PICC (peripherally inserted central catheter) in place 01/11/2021   placed at [facility] in [place], [place] for IV antibiotics   Positive blood culture 01/08/2021   Klebsiella Pneumaniae,   treated with daily IV antibiotic   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[facility]) oncologist--- dr cloretta   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,  invasive SCC , completed chemo/ radiation 12-23-2016;  recurrent metastatsis retroperitoneal lymphdenopathy,  completed radiation 04-13-2020 residual right ureteral uropathy obstruction   Retroperitoneal lymphadenopathy    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   Sepsis due to Klebsiella pneumoniae (HCC) 01/07/2021   pt admitted to [facility] in [place],  dx sepsis secondary to acute pyelonephritis with bacteremia , positive blood culture,  discharged 01-11-2021 home daily IV antibiotic   Wears glasses     Family History  Problem Relation Age of Onset   Lung cancer Mother        lung   Breast cancer Mother 34   Ovarian cancer Mother 36   Pancreatitis Father    Breast cancer Sister 31   Breast cancer Maternal Grandmother 68       breast    Breast cancer Maternal Aunt 58       breast   Melanoma Sister    Breast cancer Sister 26   Non-Hodgkin's lymphoma Paternal Grandmother  Social History   Socioeconomic History   Marital status: Widowed    Spouse name: Not on file   Number of children: 0   Years of education: 14   Highest education level: Associate degree: academic program  Occupational History   Occupation: self employed    Comment: full time education officer, museum  Tobacco Use   Smoking status: Former    Current packs/day: 0.00    Average packs/day: 1 pack/day for 25.0 years (25.0 ttl pk-yrs)    Types: Cigarettes    Start date: 06/19/1970    Quit date: 06/20/1995    Years since quitting: 29.2   Smokeless tobacco: Never  Vaping Use   Vaping status: Never Used  Substance and Sexual Activity   Alcohol  use: Not Currently    Alcohol /week: 0.0 standard drinks of alcohol     Comment: occasional wine   Drug use: Yes    Types: Marijuana    Comment: 01-12-2021  per pt once a week   Sexual activity: Not on file  Other Topics  Concern   Not on file  Social History Narrative   H H  of 1      5 pets.   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    Tea green tea and earl gray    Moved from [place] to [place] area in [place]Sep[place]1984   1 pregnancy   Husband died spring  2016 cv   Sister died 03/30/15 Bone cancer    3 remaining sisters            Social Drivers of Corporate Investment Banker Strain: Low Risk  (06/26/2023)   Overall Financial Resource Strain (CARDIA)    Difficulty of Paying Living Expenses: Not hard at all  Food Insecurity: No Food Insecurity (02/28/2024)   Hunger Vital Sign    Worried About Running Out of Food in the Last Year: Never true    Ran Out of Food in the Last Year: Never true  Transportation Needs: No Transportation Needs (02/28/2024)   PRAPARE - Administrator, Civil Service (Medical): No    Lack of Transportation (Non-Medical): No  Physical Activity: Insufficiently Active (06/26/2023)   Exercise Vital Sign    Days of Exercise per Week: 6 days    Minutes of Exercise per Session: 20 min  Stress: Patient Declined (06/26/2023)   [employer] of Occupational Health - Occupational Stress Questionnaire    Feeling of Stress : Patient declined  Social Connections: Moderately Integrated (02/28/2024)   Social Connection and Isolation Panel    Frequency of Communication with Friends and Family: Twice a week    Frequency of Social Gatherings with Friends and Family: More than three times a week    Attends Religious Services: More than 4 times per year    Active Member of [employer] or Organizations: Yes    Attends Banker Meetings: More than 4 times per year    Marital Status: Widowed    Outpatient Medications Prior to Visit  Medication Sig Dispense Refill   acetaminophen  (TYLENOL ) 650 MG CR tablet Take 2 tablets (1,300 mg total) by mouth every 8 (eight) hours as  needed for pain. 90 tablet 2   azelastine  (ASTELIN ) 0.1 % nasal spray Place 2 sprays into both nostrils 2 (two) times daily. Use in each nostril as directed  30 mL 1   b complex vitamins capsule Take 1 capsule by mouth daily.     benzonatate  (TESSALON ) 100 MG capsule Take 1 capsule (100 mg total) by mouth every 8 (eight) hours. 21 capsule 0   castor oil liquid Take 1 mL by mouth daily. Applied to eyelids     Cholecalciferol (VITAMIN D3 PO) Take 1 tablet by mouth daily.     Collagen-Vitamin C-Biotin (COLLAGEN PO) Take 1 Scoop by mouth in the morning. Gundry MD Phyto Collagen Complex     conjugated estrogens  (PREMARIN ) vaginal cream Place 1 Applicatorful vaginally at bedtime.     CRANBERRY PO Take 1 capsule by mouth daily. Organic Cranberry Concentrate by Pure Co     levothyroxine  (SYNTHROID ) 75 MCG tablet TAKE 1 TABLET EVERY DAY 90 tablet 2   Misc Natural Products (TURMERIC CURCUMIN) CAPS Take 1 capsule by mouth daily.     Multiple Minerals-Vitamins (CAL MAG ZINC +D3 PO) Take 1 tablet by mouth daily.     MYRBETRIQ  50 MG TB24 tablet Take 50 mg by mouth in the morning.     olive oil external oil 1 oz daily. By mouth for constipation.     Olopatadine  HCl (PATADAY ) 0.2 % SOLN Place 1 drop into both eyes in the morning.     Polyethyl Glyc-Propyl Glyc PF (SYSTANE HYDRATION PF) 0.4-0.3 % SOLN Apply to eye.     Prenatal Vit-Fe Fumarate-FA (PRENATAL PO) Take 1 capsule by mouth daily. [employer] Prenatal Vitamin     doxycycline  (VIBRAMYCIN ) 100 MG capsule Take 1 capsule (100 mg total) by mouth 2 (two) times daily. (Patient not taking: Reported on 08/27/2024) 10 capsule 0   No facility-administered medications prior to visit.     EXAM:  BP 118/64 (BP Location: Left Arm, Patient Position: Sitting, Cuff Size: Large)   Pulse 69   Temp 97.7 F (36.5 C) (Oral)   Ht 5' 1 (1.549 m)   Wt 164 lb 12.8 oz (74.8 kg)   SpO2 100%   BMI 31.14 kg/m   Body mass index is 31.14 kg/m.  GENERAL:  vitals reviewed and listed above, alert, oriented, appears well hydrated and in no acute distress HEENT: atraumatic, conjunctiva  clear, no obvious abnormalities on inspection of external nose and earstms clear  OP : no lesion edema or exudate face nt   NECK: no obvious masses on inspection palpation  LUNGS: clear to auscultation bilaterally, no wheezes, rales or rhonchi, good air movement CV: HRRR, no clubbing cyanosis or  peripheral edema nl cap refill  MS: moves all extremities without noticeable focal  abnormality PSYCH: pleasant and cooperative, no obvious depression or anxiety Lab Results  Component Value Date   WBC 3.6 (L) 08/14/2024   HGB 11.3 (L) 08/14/2024   HCT 35.7 (L) 08/14/2024   PLT 181 08/14/2024   GLUCOSE 77 08/14/2024   CHOL 156 03/29/2022   TRIG 56 03/29/2022   HDL 94 03/29/2022   LDLCALC 50 03/29/2022   ALT 12 05/06/2024   AST 29 05/06/2024   NA 133 (L) 08/14/2024   K 4.3 08/14/2024   CL 99 08/14/2024   CREATININE 0.77 08/14/2024   BUN 18 08/14/2024   CO2 25 08/14/2024   TSH 3.490 05/06/2024   INR 1.2 05/16/2021   HGBA1C 5.5 05/11/2015   BP Readings from Last 3 Encounters:  08/27/24 118/64  08/20/24 114/68  08/14/24 124/69     ASSESSMENT AND PLAN:  Discussed the following assessment and plan:  Bronchitis -  protracted resp infection  hx of se diarrhea with doxy  getting better per exam and personal hx  Anal cancer (HCC) in remission - under care   remission as of aug 25  followed oncology  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 Mucinex  most helpful  -Patient advised to return or notify health care team  if  new concerns arise.  Patient  Instructions  Continue   try tea and honey   Fu if not getting better as expected  Make a visit in feb March or later  for yearly check  or whatever is needed for health review .   Shaana Acocella K. Kalim Kissel M.D.

## 2024-09-14 ENCOUNTER — Other Ambulatory Visit: Payer: Self-pay

## 2024-09-24 ENCOUNTER — Ambulatory Visit: Admitting: Oncology

## 2024-09-24 ENCOUNTER — Other Ambulatory Visit

## 2024-09-30 ENCOUNTER — Other Ambulatory Visit: Payer: Self-pay | Admitting: Internal Medicine

## 2024-09-30 DIAGNOSIS — Z1231 Encounter for screening mammogram for malignant neoplasm of breast: Secondary | ICD-10-CM

## 2024-10-09 ENCOUNTER — Ambulatory Visit
Admission: RE | Admit: 2024-10-09 | Discharge: 2024-10-09 | Disposition: A | Source: Ambulatory Visit | Attending: Internal Medicine | Admitting: Internal Medicine

## 2024-10-09 DIAGNOSIS — Z1231 Encounter for screening mammogram for malignant neoplasm of breast: Secondary | ICD-10-CM

## 2024-10-10 ENCOUNTER — Telehealth: Payer: Self-pay

## 2024-10-10 NOTE — Telephone Encounter (Signed)
 Left the patient a detailed voicemail regarding her appointment on Monday, 10/14/2024, being rescheduled to Tuesday, 10/29/2024, due to the inclement weather forecast for this weekend.

## 2024-10-10 NOTE — Telephone Encounter (Signed)
 Received call back from patient that she will not be available on 10/29/24. Appointments rescheduled to 11/13/2024, patient aware and agreeable.

## 2024-10-11 ENCOUNTER — Other Ambulatory Visit: Payer: Self-pay

## 2024-10-14 ENCOUNTER — Inpatient Hospital Stay

## 2024-10-14 ENCOUNTER — Inpatient Hospital Stay: Admitting: Oncology

## 2024-10-15 ENCOUNTER — Ambulatory Visit: Payer: Self-pay

## 2024-10-15 ENCOUNTER — Ambulatory Visit: Admitting: Family Medicine

## 2024-10-15 ENCOUNTER — Encounter: Payer: Self-pay | Admitting: Internal Medicine

## 2024-10-15 ENCOUNTER — Ambulatory Visit: Admitting: Internal Medicine

## 2024-10-15 ENCOUNTER — Telehealth: Payer: Self-pay | Admitting: Internal Medicine

## 2024-10-15 VITALS — BP 110/70 | HR 60 | Temp 98.2°F | Wt 167.8 lb

## 2024-10-15 DIAGNOSIS — J069 Acute upper respiratory infection, unspecified: Secondary | ICD-10-CM

## 2024-10-15 DIAGNOSIS — R059 Cough, unspecified: Secondary | ICD-10-CM | POA: Diagnosis not present

## 2024-10-15 LAB — POCT INFLUENZA A/B
Influenza A, POC: NEGATIVE
Influenza B, POC: NEGATIVE

## 2024-10-15 LAB — POC COVID19 BINAXNOW: SARS Coronavirus 2 Ag: NEGATIVE

## 2024-10-15 NOTE — Telephone Encounter (Signed)
 noted

## 2024-10-15 NOTE — Telephone Encounter (Signed)
 FYI Only or Action Required?: FYI only for provider: appointment scheduled on 10/16/24.  Patient was last seen in primary care on 08/27/2024 by Panosh, Apolinar POUR, MD.  Called Nurse Triage reporting Generalized Body Aches and Cough.  Symptoms began several days ago.  Interventions attempted: OTC medications: coldease; cough drops and Prescription medications: oxycodone .  Symptoms are: gradually worsening.  Triage Disposition: See HCP Within 4 Hours (Or PCP Triage)  Patient/caregiver understands and will follow disposition?: Yes    Message from [facility] sent at 10/15/2024 11:02 AM EST  Reason for Triage: States she might have a chest infection, coughing to the point now everything hurts from her head to chest, states chest really hurts, throat hurts, extreme fatigue no energy in 4 days      Reason for Disposition  Wheezing is present  Answer Assessment - Initial Assessment Questions Pt called to report worsening body aches and chest congestion x 4 days. Pt states she went to visit a friend who lives in a nursing home and he was sick, the next day pt developed similar symptoms. Pt unsure of flu/strep status for exposure purposes. Pt states body aches have worsened and no energy. Pt states she has tried coldease, cough drops and took oxycodone  yesterday in hopes she could sleep off symptoms. Pt is willing to see alternative provider. Appointment scheduled for evaluation. Patient agrees with plan of care, and will call back if anything changes, or if symptoms worsen.      1. ONSET: When did the cough begin?      X 4 days ago   2. SEVERITY: How bad is the cough today?      Moderate; states that body aches and fatigue are the worse symptoms  3. SPUTUM: Describe the color of your sputum (e.g., none, dry cough; clear, white, yellow, green)     Dry with occasional white mucous   4. HEMOPTYSIS: Are you coughing up any blood? If Yes, ask: How much? (e.g., flecks, streaks,  tablespoons, etc.)     No   5. DIFFICULTY BREATHING: Are you having difficulty breathing? If Yes, ask: How bad is it? (e.g., mild, moderate, severe)      Wheezing, chest congestion   6. FEVER: Do you have a fever? If Yes, ask: What is your temperature, how was it measured, and when did it start?     No   10. OTHER SYMPTOMS: Do you have any other symptoms? (e.g., runny nose, wheezing, chest pain)       Wheezing, chest congestion with head pressure  Protocols used: Cough - Acute Productive-A-AH

## 2024-10-15 NOTE — Telephone Encounter (Signed)
 Pt saw dr theophilus today and was at the pharm waiting for medication for her cough/wheezing. Please advise

## 2024-10-15 NOTE — Progress Notes (Signed)
 "    Established Patient Office Visit     CC/Reason for Visit: URI symptoms  HPI: Ashley Moore is a 74 y.o. female who is coming in today for the above mentioned reasons.  For the past 4 days has been having dry cough, nasal congestion, chest congestion no fever.  No sick contacts but she did visit a sick friend at a local nursing home about 24 hours prior to symptom onset.   Past Medical/Surgical History: Past Medical History:  Diagnosis Date   Arthritis    Bowen's disease    excised 1992   Chronic low back pain    Chronic radiation cystitis    DDD (degenerative disc disease), cervical    Family history of breast cancer    Family history of lung cancer    Family history of non-Hodgkin's lymphoma    Family history of ovarian cancer    Headache    Hx of varicella    Hydronephrosis of right kidney    urologist--- dr alvaro,  malignant,  treated with ureter stent   Hypothyroidism    followed by pcp   Lower urinary tract symptoms (LUTS)    01-12-2021  per pt treated with accupuncture at dr alvaro office   Lymphedema of both lower extremities    PICC (peripherally inserted central catheter) in place 01/11/2021   placed at [facility] in [place], [place] for IV antibiotics   Positive blood culture 01/08/2021   Klebsiella Pneumaniae,  treated with daily IV antibiotic   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[facility]) oncologist--- dr cloretta   dx 02/ 2018,  invasive SCC , completed chemo/ radiation 12-23-2016;  recurrent metastatsis retroperitoneal lymphdenopathy,  completed radiation 04-13-2020 residual right ureteral uropathy obstruction   Retroperitoneal lymphadenopathy    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   Sepsis due to Klebsiella pneumoniae (HCC) 01/07/2021   pt admitted to [facility] in [place],  dx sepsis secondary to acute pyelonephritis with bacteremia , positive blood culture,  discharged 01-11-2021 home daily IV antibiotic    Wears glasses     Past Surgical History:  Procedure Laterality Date   BREAST BIOPSY Right    BUNIONECTOMY Right yrs ago   CYSTOSCOPY W/ URETERAL STENT PLACEMENT Right 06/12/2020   Procedure: CYSTOSCOPY WITH RETROGRADE PYELOGRAM/URETERAL STENT EXCHANGE;  Surgeon: [name], MD;  Location: [facility];  Service: Urology;  Laterality: Right;   CYSTOSCOPY W/ URETERAL STENT PLACEMENT Right 01/13/2021   Procedure: CYSTOSCOPY WITH RETROGRADE PYELOGRAM/URETERAL STENT REMOVALRIGHT;  Surgeon: [name], MD;  Location: [facility];  Service: Urology;  Laterality: Right;  45 MINS   CYSTOSCOPY WITH RETROGRADE PYELOGRAM, URETEROSCOPY AND STENT PLACEMENT Right 01/28/2020   Procedure: CYSTOSCOPY WITH RETROGRADE PYELOGRAM, URETEROSCOPY AND STENT PLACEMENT;  Surgeon: [name], MD;  Location: [facility];  Service: Urology;  Laterality: Right;   EYE SURGERY Bilateral    ptosis   IR GENERIC HISTORICAL  11/14/2016   IR US  GUIDE VASC ACCESS RIGHT 11/14/2016 [facility]   IR GENERIC HISTORICAL  11/14/2016   IR FLUORO GUIDE CV LINE RIGHT 11/14/2016 [facility]   IR GENERIC HISTORICAL  12/12/2016   IR US  GUIDE VASC ACCESS RIGHT 12/12/2016 [name], MD [facility]   IR GENERIC HISTORICAL  12/12/2016   IR FLUORO GUIDE CV LINE RIGHT 12/12/2016 [name], MD [facility]   SKIN CANCER EXCISION  1990   bowens disease   TOTAL KNEE ARTHROPLASTY Right 02/27/2024   Procedure:  ARTHROPLASTY, KNEE, TOTAL;  Surgeon: [name], MD;  Location: [facility];  Service: Orthopedics;  Laterality: Right;    Social History:  reports that she quit smoking about 29 years ago. Her smoking use included cigarettes. She started smoking about 54 years ago. She has a 25 pack-year smoking history. She has never used smokeless tobacco. She reports that she does not currently use alcohol . She reports current drug use. Drug: Marijuana.  Allergies: Allergies[1]  Family History:  Family  History  Problem Relation Age of Onset   Lung cancer Mother        lung   Breast cancer Mother 71   Ovarian cancer Mother 54   Pancreatitis Father    Breast cancer Sister 47   Breast cancer Maternal Grandmother 64       breast    Breast cancer Maternal Aunt 48       breast   Melanoma Sister    Breast cancer Sister 67   Non-Hodgkin's lymphoma Paternal Grandmother     Current Medications[2]  Review of Systems:  Negative unless indicated in HPI.   Physical Exam: Vitals:   10/15/24 1455  BP: 110/70  Pulse: 60  Temp: 98.2 F (36.8 C)  TempSrc: Oral  SpO2: 95%  Weight: 167 lb 12.8 oz (76.1 kg)    Body mass index is 31.71 kg/m.   Physical Exam Vitals reviewed.  Constitutional:      Appearance: Normal appearance.  HENT:     Head: Normocephalic and atraumatic.  Eyes:     Conjunctiva/sclera: Conjunctivae normal.  Cardiovascular:     Rate and Rhythm: Normal rate and regular rhythm.  Pulmonary:     Effort: Pulmonary effort is normal.     Breath sounds: Normal breath sounds.  Skin:    General: Skin is warm and dry.  Neurological:     General: No focal deficit present.     Mental Status: She is alert and oriented to person, place, and time.  Psychiatric:        Mood and Affect: Mood normal.        Behavior: Behavior normal.        Thought Content: Thought content normal.        Judgment: Judgment normal.      Impression and Plan:  Cough, unspecified type -     POCT Influenza A/B -     POC COVID-19 BinaxNow  Upper respiratory tract infection, unspecified type   - In office flu and COVID tests are negative. -Given exam findings, PNA, pharyngitis, ear infection are not likely, hence abx have not been prescribed. -Have advised rest, fluids, OTC antihistamines, cough suppressants and mucinex . -RTC if no improvement in 10-14 days. - Have given some Tessalon  Perles to use as needed for cough.   Time spent:22 minutes reviewing chart, interviewing and  examining patient and formulating plan of care.     Tully Theophilus Andrews, MD  Primary Care at [facility]     [1]  Allergies Allergen Reactions   Sulfamethoxazole -Trimethoprim  Other (See Comments)    C/o  abd pain and constipation   Benadryl [Diphenhydramine] Other (See Comments)    Tingle all over    Doxycycline  Diarrhea   Melatonin     Tingle all over    Penicillins Rash    Did it involve swelling of the face/tongue/throat, SOB, or low BP? N Did it involve sudden or severe rash/hives, skin peeling, or any reaction on the inside of your mouth or nose? Y Did  you need to seek medical attention at a hospital or doctor's office? N When did it last happen? Almost 50 years Ago      If all above answers are NO, may proceed with cephalosporin use. Tolerated Ancef  02/27/24   [2]  Current Outpatient Medications:    acetaminophen  (TYLENOL ) 650 MG CR tablet, Take 2 tablets (1,300 mg total) by mouth every 8 (eight) hours as needed for pain., Disp: 90 tablet, Rfl: 2   azelastine  (ASTELIN ) 0.1 % nasal spray, Place 2 sprays into both nostrils 2 (two) times daily. Use in each nostril as directed, Disp: 30 mL, Rfl: 1   b complex vitamins capsule, Take 1 capsule by mouth daily., Disp: , Rfl:    benzonatate  (TESSALON ) 100 MG capsule, Take 1 capsule (100 mg total) by mouth every 8 (eight) hours., Disp: 21 capsule, Rfl: 0   castor oil liquid, Take 1 mL by mouth daily. Applied to eyelids, Disp: , Rfl:    Cholecalciferol (VITAMIN D3 PO), Take 1 tablet by mouth daily., Disp: , Rfl:    Collagen-Vitamin C-Biotin (COLLAGEN PO), Take 1 Scoop by mouth in the morning. Gundry MD Phyto Collagen Complex, Disp: , Rfl:    conjugated estrogens (PREMARIN) vaginal cream, Place 1 Applicatorful vaginally at bedtime., Disp: , Rfl:    CRANBERRY PO, Take 1 capsule by mouth daily. Organic Cranberry Concentrate by Pure Co, Disp: , Rfl:    doxycycline  (VIBRAMYCIN ) 100 MG capsule, Take 1 capsule (100 mg total) by  mouth 2 (two) times daily., Disp: 10 capsule, Rfl: 0   levothyroxine  (SYNTHROID ) 75 MCG tablet, TAKE 1 TABLET EVERY DAY, Disp: 90 tablet, Rfl: 2   Misc Natural Products (TURMERIC CURCUMIN) CAPS, Take 1 capsule by mouth daily., Disp: , Rfl:    Multiple Minerals-Vitamins (CAL MAG ZINC +D3 PO), Take 1 tablet by mouth daily., Disp: , Rfl:    MYRBETRIQ  50 MG TB24 tablet, Take 50 mg by mouth in the morning., Disp: , Rfl:    olive oil external oil, 1 oz daily. By mouth for constipation., Disp: , Rfl:    Olopatadine  HCl (PATADAY ) 0.2 % SOLN, Place 1 drop into both eyes in the morning., Disp: , Rfl:    Polyethyl Glyc-Propyl Glyc PF (SYSTANE HYDRATION PF) 0.4-0.3 % SOLN, Apply to eye., Disp: , Rfl:    Prenatal Vit-Fe Fumarate-FA (PRENATAL PO), Take 1 capsule by mouth daily. [employer] Prenatal Vitamin, Disp: , Rfl:   "

## 2024-10-16 ENCOUNTER — Other Ambulatory Visit: Payer: Self-pay | Admitting: Internal Medicine

## 2024-10-16 DIAGNOSIS — J069 Acute upper respiratory infection, unspecified: Secondary | ICD-10-CM

## 2024-10-16 MED ORDER — BENZONATATE 100 MG PO CAPS: 100.0000 mg | ORAL_CAPSULE | Freq: Two times a day (BID) | ORAL | 0 refills | Status: AC | PRN

## 2024-10-16 MED ORDER — ALBUTEROL SULFATE HFA 108 (90 BASE) MCG/ACT IN AERS: 2.0000 | INHALATION_SPRAY | Freq: Four times a day (QID) | RESPIRATORY_TRACT | 0 refills | Status: AC | PRN

## 2024-10-16 NOTE — Telephone Encounter (Signed)
Patient is aware and refill sent.

## 2024-10-29 ENCOUNTER — Inpatient Hospital Stay: Admitting: Oncology

## 2024-10-29 ENCOUNTER — Inpatient Hospital Stay

## 2024-11-13 ENCOUNTER — Inpatient Hospital Stay

## 2024-11-13 ENCOUNTER — Inpatient Hospital Stay: Admitting: Oncology
